# Patient Record
Sex: Female | Born: 1955 | Race: White | Hispanic: No | Marital: Married | Smoking: Never smoker
Health system: Western US, Community
[De-identification: ages and names within clinical notes are randomized; demographics above are authoritative.]

## PROBLEM LIST (undated history)

## (undated) LAB — PREPARE RBC
Blood Product Blood Type Barcode: 5100
Blood Product Blood Type Barcode: 5100
Blood Product Blood Type Barcode: 5100
Blood Product Blood Type Barcode: 5100
Blood Product Blood Type Barcode: 5100
Blood Product Blood Type Barcode: 5100
Blood Product Blood Type Barcode: 5100
Blood Product Blood Type Barcode: 5100
Blood Product Blood Type Barcode: 5100
Blood Product Blood Type Barcode: 5100
Blood Product Blood Type Barcode: 5100
Blood Product Blood Type Barcode: 5100
Blood Product Blood Type Barcode: 5100
Blood Product Blood Type Barcode: 5100
Blood Product Blood Type Barcode: 5100
Blood Product Blood Type Barcode: 5100
Blood Product Blood Type Barcode: 5100
Blood Product Blood Type Barcode: 5100
Blood Product Blood Type Barcode: 5100
Blood Product Blood Type Barcode: 5100
Blood Product Blood Type Barcode: 5100
Blood Product Blood Type Barcode: 9500
Blood Product Status: TRANSFUSED
Blood Product Status: TRANSFUSED
Blood Product Status: TRANSFUSED
Blood Product Status: TRANSFUSED
Blood Product Status: TRANSFUSED
Blood Product Status: TRANSFUSED
Blood Product Status: TRANSFUSED
Blood Product Status: TRANSFUSED
Blood Product Status: TRANSFUSED
Blood Product Status: TRANSFUSED
Blood Product Status: TRANSFUSED
Blood Product Status: TRANSFUSED
Blood Product Status: TRANSFUSED
Blood Product Status: TRANSFUSED
Blood Product Status: TRANSFUSED
Blood Product Unit ABORh: O POS
Blood Product Unit ABORh: O POS
Blood Product Unit ABORh: O POS
Blood Product Unit ABORh: O POS
Blood Product Unit ABORh: O POS
Blood Product Unit ABORh: O POS
Blood Product Unit ABORh: O POS
Blood Product Unit ABORh: O POS
Blood Product Unit ABORh: O POS
Blood Product Unit ABORh: O POS
Blood Product Unit ABORh: O POS
Blood Product Unit ABORh: O POS
Blood Product Unit Expiration: 201708302359
Blood Product Unit Expiration: 201708312359
Blood Product Unit Expiration: 201708312359
Blood Product Unit Expiration: 201708312359
Blood Product Unit Expiration: 201709012359
Blood Product Unit Expiration: 201709012359
Blood Product Unit Expiration: 201709012359
Blood Product Unit Expiration: 201709012359
Blood Product Unit Expiration: 201709052359
Blood Product Unit Expiration: 201709052359
Blood Product Unit Expiration: 201709062359
Blood Product Unit Expiration: 201709062359
Blood Product Unit Expiration: 202306292359
Blood Product Unit Expiration: 202307052359
Blood Product Unit Expiration: 202309142359
Blood Product Unit Expiration: 202309142359
Blood Product Unit Expiration: 202310112359
Blood Product Unit Expiration: 202310162359
Blood Product Unit Expiration: 202310192359
Blood Product Unit Expiration: 202310232359
Blood Product Unit Expiration: 202310232359
Blood Product Unit Expiration: 202310262359
Blood Product Unit Rh: NEGATIVE
Blood Product Unit Rh: POSITIVE
Blood Product Unit Rh: POSITIVE
Blood Product Unit Rh: POSITIVE
Blood Product Unit Rh: POSITIVE
Blood Product Unit Rh: POSITIVE
Blood Product Unit Rh: POSITIVE
Blood Product Unit Rh: POSITIVE
Blood Product Unit Rh: POSITIVE
Blood Product Unit Rh: POSITIVE
Blood Product Unit Rh: POSITIVE
Blood Product Unit Rh: POSITIVE
Blood Product Unit Rh: POSITIVE
Blood Product Unit Rh: POSITIVE
Blood Product Unit Rh: POSITIVE
Blood Product Unit Rh: POSITIVE
Blood Product Unit Rh: POSITIVE
Blood Product Unit Rh: POSITIVE
Blood Product Unit Rh: POSITIVE
Blood Product Unit Rh: POSITIVE
Blood Product Unit Rh: POSITIVE
Blood Product Unit Rh: POSITIVE
Blood Product Unit Volume: 300 ml
Blood Product Unit Volume: 300 ml
Blood Product Unit Volume: 300 ml
Blood Product Unit Volume: 300 ml
Blood Product Unit Volume: 300 ml
Blood Product Unit Volume: 300 ml
Blood Product Unit Volume: 300 ml
Blood Product Unit Volume: 300 ml
Blood Product Unit Volume: 300 ml
Blood Product Unit Volume: 300 ml

## (undated) LAB — PREPARE FRESH FROZEN PLASMA
Blood Product Blood Type Barcode: 5100
Blood Product Blood Type Barcode: 5100
Blood Product Blood Type Barcode: 9500
Blood Product Blood Type Barcode: 9500
Blood Product Status: TRANSFUSED
Blood Product Status: TRANSFUSED
Blood Product Unit ABORh: O NEG
Blood Product Unit ABORh: O NEG
Blood Product Unit ABORh: O POS
Blood Product Unit ABORh: O POS
Blood Product Unit Expiration: 201708220831
Blood Product Unit Expiration: 201708220831
Blood Product Unit Expiration: 201708221120
Blood Product Unit Expiration: 201708221120
Blood Product Unit Rh: NEGATIVE
Blood Product Unit Rh: NEGATIVE
Blood Product Unit Rh: POSITIVE
Blood Product Unit Rh: POSITIVE

## (undated) MED FILL — CALCIUM 500 MG (AS CARBONATE)-VITAMIN D3 5 MCG (200 UNIT) TABLET: 500 mg-200 unit | ORAL | 34 days supply | Qty: 200 | Fill #0

## (undated) MED FILL — HYDROCODONE 5 MG-ACETAMINOPHEN 325 MG TABLET: 5-325 mg | ORAL | 7 days supply | Qty: 30 | Fill #0

## (undated) MED FILL — CEPHALEXIN 500 MG CAPSULE: 500 mg | ORAL | 3 days supply | Qty: 12 | Fill #0

---

## 2016-01-21 ENCOUNTER — Emergency Department: Admit: 2016-01-21 | Discharge: 2016-01-25

## 2016-01-21 ENCOUNTER — Inpatient Hospital Stay
Admission: EM | Admit: 2016-01-21 | Discharge: 2016-01-23 | Disposition: A | Discharge status: Still In Hospital | Payer: MEDICAID | Admitting: MD

## 2016-01-21 ENCOUNTER — Emergency Department: Admit: 2016-01-21

## 2016-01-21 ENCOUNTER — Encounter

## 2016-01-21 DIAGNOSIS — I214 Non-ST elevation (NSTEMI) myocardial infarction: Principal | ICD-10-CM

## 2016-01-21 LAB — CELLAVISION DIFFERENTIAL
Basophils %: 2 % (ref 0–2)
Basophils, Absolute: 0.2 10*3/ÂµL — ABNORMAL HIGH (ref 0.0–0.1)
Eosinophils %: 0 % (ref 0–5)
Eosinophils, Absolute: 0 10*3/ÂµL (ref 0.0–0.4)
Lymphocytes %: 15 % — ABNORMAL LOW (ref 20–40)
Lymphocytes, Absolute: 1.8 10*3/ÂµL (ref 1.0–4.0)
Monocytes %: 4 % (ref 2–12)
Monocytes, Absolute: 0.5 10*3/ÂµL (ref 0.2–1.0)
Neutrophils %: 79 % — ABNORMAL HIGH (ref 50–74)
Neutrophils, Absolute: 9.7 10*3/ÂµL — ABNORMAL HIGH (ref 2.0–7.4)
Platelet Estimate: INCREASED — AB
Total Cells Counted: 100
WBC: 12.2 10*3/ÂµL — ABNORMAL HIGH (ref 4.5–11.0)
nRBC: 2 #/100 WBC — ABNORMAL HIGH

## 2016-01-21 LAB — PROCALCITONIN: Procalcitonin: <0.05 ng/mL (ref <=0.09)

## 2016-01-21 LAB — HEPATIC FUNCTION PANEL
ALT - Alanine Aminotransferase: 35 U/L — ABNORMAL HIGH (ref 5–33)
AST - Aspartate Aminotransferase: 29 U/L (ref 5–32)
Albumin: 3.6 g/dL (ref 3.5–5.0)
Alkaline Phosphatase: 87 U/L (ref 35–105)
Bilirubin Direct: <0.2 mg/dL (ref 0.0–0.3)
Bilirubin Total: 0.9 mg/dL (ref 0.10–1.70)
Protein Total: 7 g/dL (ref 6.1–7.9)

## 2016-01-21 LAB — POCT CHEM 8
POC Anion Gap: 18 mmol/L (ref 12–20)
POC BUN: 22 mg/dL — ABNORMAL HIGH (ref 6–20)
POC Chloride: 105 mmol/L (ref 101–111)
POC Creatinine: 0.8 mg/dL (ref 0.6–1.3)
POC Glucose: 123 mg/dL — ABNORMAL HIGH (ref 74–106)
POC Hematocrit: 31 %PCV — ABNORMAL LOW (ref 35–48)
POC Hemoglobin: 10.5 g/dL — ABNORMAL LOW (ref 11.7–16.5)
POC Ionized Calcium: 5.3 mg/dL (ref 4.5–5.3)
POC Potassium: 3.5 mmol/L (ref 3.5–5.1)
POC Sodium: 138 mmol/L (ref 135–145)
POC Total CO2: 19 mmol/L — ABNORMAL LOW (ref 22–32)

## 2016-01-21 LAB — CBC WITH AUTO DIFFERENTIAL
HCT: 29 % — ABNORMAL LOW (ref 35.0–48.0)
Hemoglobin: 8.6 g/dL — ABNORMAL LOW (ref 11.7–16.5)
MCH: 20.3 pg — ABNORMAL LOW (ref 28.3–33.3)
MCHC: 29.8 g/dL — ABNORMAL LOW (ref 32.5–36.0)
MCV: 68 fL — ABNORMAL LOW (ref 81.0–100.0)
MPV: 8.4 fL (ref 6.9–10.0)
Platelet Count: 487 10*3/ÂµL — ABNORMAL HIGH (ref 150–405)
RBC: 4.26 10*6/ÂµL (ref 3.80–5.60)
RDW: 17.3 % — ABNORMAL HIGH (ref 11.7–16.1)
WBC: 12.2 10*3/ÂµL — ABNORMAL HIGH (ref 4.5–11.0)

## 2016-01-21 LAB — MYOGLOBIN: Myoglobin: 50 ng/mL (ref 25–58)

## 2016-01-21 LAB — RETICULOCYTE COUNT
Reticulocyte Absolute Count: 0.121 10*6/ÂµL — ABNORMAL HIGH (ref 0.037–0.109)
Reticulocyte Relative Percent: 2.9 % — ABNORMAL HIGH (ref 1.0–2.3)

## 2016-01-21 LAB — PRO B-TYPE NATRIURETIC PEPTIDE (SLM): Pro B-Type Natriuretic Peptide: 5485 pg/mL — ABNORMAL HIGH (ref 0–900)

## 2016-01-21 LAB — CK: CK (Creatine Kinase): 64 U/L (ref 26–192)

## 2016-01-21 LAB — LACTIC ACID: Lactic Acid: 1.5 mmol/L (ref 0.5–2.2)

## 2016-01-21 LAB — TROPONIN T: Troponin T: 0.08 ng/mL (ref 0.00–0.01)

## 2016-01-21 LAB — CKMB: CKMB: 2.4 ng/mL (ref 0.6–6.3)

## 2016-01-21 LAB — POC TROPONIN I: POC Troponin: 0.15 ng/mL — ABNORMAL HIGH (ref 0.00–0.08)

## 2016-01-21 LAB — BLOOD CULTURE
Blood Culture Result: NO GROWTH
Blood Culture Result: NO GROWTH

## 2016-01-21 LAB — C-REACTIVE PROTEIN (HIGH SENSITIVITY): CRP (High Sensitivity): 8.78 mg/dL — ABNORMAL HIGH (ref 0.000–0.748)

## 2016-01-21 MED ORDER — iopamidol (ISOVUE-370) 76 % injection 100 mL
370 | Freq: Once | INTRAVENOUS | Status: AC
Start: 2016-01-21 — End: 2016-01-21
  Administered 2016-01-21: 23:00:00 370 mL via INTRAVENOUS

## 2016-01-21 MED ORDER — aspirin 81 MG chewable tablet 324 mg
81 | Freq: Once | ORAL | Status: AC
Start: 2016-01-21 — End: 2016-01-21
  Administered 2016-01-21: 22:00:00 81 mg via ORAL

## 2016-01-21 MED ORDER — sodium chloride (NS) 0.9% bolus 500 mL
Freq: Once | INTRAVENOUS | Status: AC
Start: 2016-01-21 — End: 2016-01-21
  Administered 2016-01-21 (×2): via INTRAVENOUS

## 2016-01-21 MED ORDER — albuterol (PROVENTIL) nebulizer solution 2.5 mg
2.5 | Freq: Once | RESPIRATORY_TRACT | Status: AC
Start: 2016-01-21 — End: 2016-01-21
  Administered 2016-01-21: 2.5 mg via RESPIRATORY_TRACT

## 2016-01-21 MED ORDER — nitroglycerin (NITROSTAT) SL tablet 0.4 mg
0.4 | SUBLINGUAL | Status: DC | PRN
Start: 2016-01-21 — End: 2016-01-21

## 2016-01-21 NOTE — ED Notes (Signed)
Pt updated re poc. Intermittent cough & SOB. IVF infusing. Husband at bedside.

## 2016-01-21 NOTE — ED Notes (Signed)
Pt updated re poc. IV ABX started. No distress noted or reported at this time. Cough notably improved from initial assessment. Cont to monitor.

## 2016-01-21 NOTE — ED Notes (Signed)
Pt back from CT. Cont to deny CP. Vss. Husband at bedside.

## 2016-01-21 NOTE — ED Notes (Signed)
Pt reports little improvement after resp tx. Vss.

## 2016-01-21 NOTE — Progress Notes (Signed)
HOME MEDICATION LIST REVIEWED BY PHARMACIST        Prior To Admission Home Medication list in Epic was prepared during this inpatient visit by a team member other than a pharmacist in a best attempt to document medications that the patient takes in the outpatient setting:      Home Medications     Med List Status:  Pharmacist Review Complete Abelardo Diesel, [facility] 01/21/2016  4:46 PM                  naproxen sodium (ALEVE) 220 MG tablet     Take 220 mg by mouth daily as needed.          The pharmacist has reviewed the list, without direct patient communication, and has not identified any concerns that are anticipated to cause harm in a typical patient.   Inaccuracies may still exist, and the home medication list has not been clinically evaluated for appropriate indications; please use clinical judgment when reconciling the list.    Signed by: Abelardo Diesel, Pharmacist

## 2016-01-21 NOTE — Progress Notes (Signed)
Pt is SOB b/s clear and diminished no change post tx. Productive cough. Pt is very anxious. 94% on RA has no respiratory history.      Lab Results   Component Value Date    WHITEBLOODCE 12.2 (H) 01/21/2016    WHITEBLOODCE 12.2 (H) 01/21/2016    HGB 8.6 (L) 01/21/2016    HCT 29.0 (L) 01/21/2016    MCV 68.0 (L) 01/21/2016    LABPLAT 487 (H) 01/21/2016     Start albuterol PRN. Will re-evaluate as needed.   [facility] Felix Pratt   01/21/2016

## 2016-01-21 NOTE — Progress Notes (Signed)
EKG COMPLETED IN ED AT 1407 BY Akita Maxim 01/21/2016

## 2016-01-21 NOTE — H&P (Addendum)
HISTORY AND PHYSICAL EXAMINATION  [facility]    Pt. Name/Age/DOBIretha [name]     60 y.o.   01-29-1956    [phone]   Medical Record Number:   [id number]  CSN: [id number]  Date of admission:  01/21/2016  Primary Care Physician:  NO FAMILY PHYSICIAN,  Admitting Physician:  Eldridge Abrahams     Chief Complaint/Reason for Visit:  Chest pressure and progressive SOB.     History of Present Illness:     Pt is a 60 yr old female with no significant PMH, never seen doctors in the past, is otherwise very healthy and physically active comes to the hospital with c/c of progressively worsening SOB since June. She says developed this cough, congestion since June which is getting worse and now she has a clear mucoidal phlegm too. Along with coughing she also c/o some chest pressure but not pain. Has been having progressively increasing LE edema. Gets DOE easily now. Denies SOB at rest or orthopnea or PND. No prior MIs. Denies any drug, smoking or alcohol use. Denies any recent travel, sick contact etc.   Says she lost 50lbs in last couple of months. Does have +FH of colon and ovarian cancer in mom.   Denies any hematuria, melena, hematochezia, will other wise have regular BMs. Never had any cancer screening - colonoscopy, mammogram or pap smear done as it will cost money.     In the ed, she was hypotensive 99/66, tachycardic in 120s. sats 95% on RA.   Labs: wbc 12.2, Hb 8.6, Htct 29, MCV 68, Platlets 487, Trop 0.08, bnp 5485, crp 8.7, procal neg, CPK-MB neg.   EKG: ST depressions in V4-6, Sinus tachy  Cxr: Scattered reticular opacities are present in a perihilar distribution with some indistinctness of the vasculature: Findings of likely mild edema. ?No definite pleural abnormality.   CTA: neg PE, Mod cardiomegaly, There are small bilateral pleural effusions, slightly larger on the right. There is diffuse geographic ground-glass attenuation seen throughout both lungs along with associated mild interlobular  septal thickening. These findings are nonspecific but most likely reflective of pulmonary edema given cardiomegaly and the pleural effusions. There are a few areas of patchy opacity seen scattered throughout both lungs which are likely reflective of atelectasis and/or scarring.  There are multiple mildly prominent mediastinal and bilateral hilar lymph nodes which may be reactive in etiology although other etiologies cannot completely be excluded. A follow-up CT scan of the chest is recommended in 3-4 months to assess for the   resolution of the lymphadenopathy.     Dr.Kucinsky is consulted, wont like to cath at this point and will see pt in am.   Stat echo ordered.     Review of Systems   Constitutional: Positive for diaphoresis, malaise/fatigue and weight loss. Negative for chills and fever.   HENT: Positive for congestion. Negative for hearing loss.    Eyes: Negative for blurred vision, double vision and photophobia.   Respiratory: Positive for cough, sputum production, shortness of breath and wheezing. Negative for hemoptysis.    Cardiovascular: Positive for leg swelling. Negative for chest pain (chest pressure), palpitations, orthopnea and claudication.   Gastrointestinal: Positive for nausea. Negative for abdominal pain, blood in stool, constipation, diarrhea, heartburn, melena and vomiting.   Genitourinary: Negative for dysuria, frequency, hematuria and urgency.   Musculoskeletal: Negative for back pain, myalgias and neck pain.   Skin: Negative for rash.   Neurological: Positive for weakness and headaches. Negative for dizziness,  tingling, tremors, sensory change, speech change, focal weakness, seizures and loss of consciousness.   Psychiatric/Behavioral: Negative for depression, hallucinations, substance abuse and suicidal ideas.       Past Medical History:   History reviewed. No pertinent past medical history.  Past Surgical History:   Procedure Laterality Date   . TONSILLECTOMY         Allergies:      Allergies   Allergen Reactions   . Sulfa (Sulfonamide Antibiotics) Rash       Home Medications:   Prior to Admission medications    Medication Sig Start Date End Date Taking? Authorizing Provider   naproxen sodium (ALEVE) 220 MG tablet Take 220 mg by mouth daily as needed.   Yes Historical Provider, MD       No current facility-administered medications on file prior to encounter.      No current outpatient prescriptions on file prior to encounter.       Medications administered in the ED  Medications   nitroglycerin (NITROSTAT) SL tablet 0.4 mg (not administered)   albuterol (PROVENTIL) nebulizer solution 2.5 mg (not administered)     And   ipratropium-albuterol (DUO-NEB) 0.5 mg-3 mg(2.5 mg base)/3 mL nebulizer solution 3 mL (not administered)     And   racepinephrine (VAPONEFRIN) 2.25 % nebulizer solution 0.5 mL (not administered)   heparin injection 5,000 Units (not administered)   sodium chloride 0.9 % (not administered)   ceftriaxone (ROCEPHIN) 1 g in 50 mL 0.9 % NaCl SNAP IVPB (not administered)   azithromycin (ZITHROMAX) 500 mg in 250 mL 0.9 % NaCl SNAP IVPB (500 mg Intravenous New Bag 01/21/16 1910)   guaiFENesin (MUCINEX) 12 hr tablet 600 mg (600 mg Oral Given 01/21/16 1905)   Normal saline infusion for medication/blood product administration for nursing (not administered)   pantoprazole (PROTONIX) EC tablet 40 mg (40 mg Oral Given 01/21/16 1904)   aspirin 81 MG chewable tablet 324 mg (324 mg Oral Given 01/21/16 1451)   sodium chloride (NS) 0.9% bolus 500 mL (0 mL Intravenous Stopped 01/21/16 1550)   iopamidol (ISOVUE-370) 76 % injection 100 mL (100 mL Intravenous Given 01/21/16 1545)   albuterol (PROVENTIL) nebulizer solution 2.5 mg (2.5 mg Nebulization Given 01/21/16 1648)   sodium chloride (NS) 0.9% bolus 500 mL (0 mL Intravenous Stopped 01/21/16 1910)   furosemide (LASIX) injection 20 mg (20 mg Intravenous Given 01/21/16 1907)   atorvastatin (LIPITOR) tablet 80 mg (80 mg Oral Given 01/21/16 1905)   digoxin  (LANOXIN) tablet 250 mcg (250 mcg Oral Given 01/21/16 1904)        Family History:    History reviewed. No pertinent family history.    Social History:   Social History     Social History   . Marital status: Married     Spouse name: N/A   . Number of children: N/A   . Years of education: N/A     Occupational History   . Not on file.     Social History Main Topics   . Smoking status: Never Smoker   . Smokeless tobacco: Never Used   . Alcohol use No   . Drug use: No   . Sexual activity: Not on file     Other Topics Concern   . Not on file     Social History Narrative   . No narrative on file       Code Status:   DNR/DNI    OBJECTIVE:  VITAL SIGNS on ADMISSION   Temp Blood Pressure Heart Rate Resp Rate O2 Sats   Temp: 36.5 ?C (97.7 ?F) BP: 126/85 Pulse: (!) 127 Resp: 24   SpO2: 96 % on  L/min                                                 MOST RECENT VITAL SIGNS   Temp Blood Pressure Heart Rate Resp Rate O2 Sats   Temp: 36.9 ?C (98.4 ?F) BP: 98/65 Pulse: (!) 117 Resp: (!) 28   SpO2: 95 % on  L/min      Admission Weight: Weight: 102.5 kg (226 lb)       BMI: Body mass index is 36.48 kg/(m^2).    Physical Examination:     Vitals:    01/21/16 1844   BP: 98/65   Pulse: (!) 117   Resp: (!) 28   Temp: 36.9 ?C (98.4 ?F)   SpO2: 95%       Physical Exam   Constitutional: She is oriented to person, place, and time. She appears well-developed and well-nourished. She appears distressed.   HENT:   Head: Normocephalic and atraumatic.   Mouth/Throat: No oropharyngeal exudate.   Eyes: Conjunctivae and EOM are normal. Pupils are equal, round, and reactive to light. No scleral icterus.   Neck: Neck supple. No JVD present.   Cardiovascular: Regular rhythm and intact distal pulses.    Murmur heard.  tachycardic   Pulmonary/Chest: Breath sounds normal. She is in respiratory distress. She has no wheezes. Rales: right >left. She exhibits no tenderness.   Abdominal: Soft. Bowel sounds are normal.  She exhibits no distension. There is no tenderness. There is no rebound and no guarding.   Musculoskeletal: She exhibits edema (2+ pitting b/l). She exhibits no tenderness.   Neurological: She is alert and oriented to person, place, and time. She has normal reflexes. She displays normal reflexes. No cranial nerve deficit. She exhibits normal muscle tone. Coordination normal.   Skin: Skin is warm. No rash noted. She is not diaphoretic. No erythema.   Psychiatric: She has a normal mood and affect. Her behavior is normal. Judgment and thought content normal.       X-ray Chest Pa Or Ap    Result Date: 01/21/2016  PROCEDURE: XR CHEST PA OR AP  COMPARISON: None.  INDICATIONS: shortness of breath  FINDINGS:   LINES/TUBES: None.  HEART SIZE: Mild cardiac enlargement.  MEDIASTINUM: Normal.  LUNGS/PLEURA: Scattered reticular opacities are present in a perihilar distribution with some indistinctness of the vasculature: Findings of likely mild edema.  No definite pleural abnormality.  OTHER: None.  CONCLUSION: See above.             Report Dictated and Electronically Signed ByJeanice Lim on 01/21/2016 at 17:13          Ct Angiogram Pulmonary    Result Date: 01/21/2016  PROCEDURE:  CT ANGIOGRAM PULMONARY  CONTRAST NONIONIC CONTRAST  COMPARISON: None.  INDICATIONS: dyspnea / chest pain evaluate pe  TECHNIQUE: 2mm CT images were obtained of the chest with narrow collimation after the injection of IV nonionic contrast timed per protocol for maximum opacification of the pulmonary arteries.  Coronal MIP reformatted images were obtained. Automated exposure control based on patient size was utilized as a dose reduction technique.  FINDINGS: The lack of a proper  patient breath hold and the patient's right arm being in the downward position resulting in spray artifact somewhat limits the evaluation of fine detail.  PULMONARY ARTERIES: No large main branch or segmental pulmonary embolism is seen.  LUNGS: There are small bilateral  pleural effusions, slightly larger on the right. There is diffuse geographic ground-glass attenuation seen throughout both lungs along with associated mild interlobular septal thickening. These findings are nonspecific but most likely reflective of pulmonary edema given cardiomegaly and the pleural effusions. There are a few areas of patchy opacity seen scattered throughout both lungs which are likely reflective of atelectasis and/or scarring.  THORACIC AORTA: No aneurysm. Minimal atherosclerotic aortic calcification. HEART / PERICARDIUM: Moderate cardiomegaly. No significant pericardial effusion.  MEDIASTINUM/HILA: There are multiple mildly prominent mediastinal and bilateral hilar lymph nodes which may be reactive in etiology although other etiologies cannot completely be excluded. A follow-up CT scan of the chest is recommended in 3-4 months to assess for the resolution of the lymphadenopathy.  CHEST WALL: Normal. LIMITED ABDOMEN: There is a small hypodense lesion seen about the falciform ligament of the liver which is too small to characterize. A tiny calcified granuloma is noted within the spleen. OTHER: There is moderate rightward convexity scoliosis of the thoracic spine along with moderate multilevel degenerative change.  CONCLUSION: See above.             Report Dictated and Electronically Signed By:  Francella Solian on 01/21/2016 at 16:15             Lab data:    Results for orders placed or performed during the hospital encounter of 01/21/16 (from the past 24 hour(s))   POC Troponin I -Next Routine     Status: Abnormal   Result Value    POC Troponin 0.15 (H)   POC Chem 8 -Next Routine     Status: Abnormal   Result Value    POC Sodium 138    POC Potassium 3.5    POC Chloride 105    POC Ionized Calcium 5.3    POC BUN 22 (H)    POC Creatinine 0.8    POC Hematocrit 31 (L)    POC Hemoglobin 10.5 (L)    POC Total CO2 19 (L)    POC Anion Gap 18    POC Glucose 123 (H)   Pro BNP (SLM) -STAT     Status: Abnormal      Result Value    Pro B-Type Natriuretic Peptide 5485 (H)   Hepatic Function Panel -STAT     Status: Abnormal   Result Value    Albumin 3.6    Bilirubin Direct <0.2    Alkaline Phosphatase 87    AST - Aspartate Aminotransferase 29    ALT - Alanine Amino transferase 35 (H)    Protein Total 7.0    Bilirubin Total 0.90   Troponin T -STAT     Status: Abnormal   Result Value    Troponin T 0.08 (HCrit)   CK (Creatine Kinase) -STAT     Status: Normal   Result Value    CK (Creatine Kinase) 64   CKMB (SLM) -STAT     Status: Normal   Result Value    CKMB 2.4   Myoglobin -STAT     Status: Normal   Result Value    Myoglobin 50   CBC with Auto Differential -STAT     Status: Abnormal   Result Value    WBC 12.2 (H)  RBC 4.26    Hemoglobin 8.6 (L)    HCT 29.0 (L)    MCV 68.0 (L)    MCH 20.3 (L)    MCHC 29.8 (L)    RDW 17.3 (H)    Platelet Count 487 (H)    MPV 8.4   Procalcitonin -Next Routine     Status: Normal   Result Value    Procalcitonin <0.05   Cellavision Differential -Next Routine     Status: Abnormal   Result Value    WBC 12.2 (H)    Neutrophils % 79 (H)    Lymphocytes % 15 (L)    Monocytes % 4    Eosinophils % 0    Basophils % 2    Neutrophils, Absolute 9.7 (H)    Lymphocytes, Absolute 1.8    Monocytes, Absolute 0.5    Eosinophils, Absolute 0.0    Basophils, Absolute 0.2 (H)    Total Cells Counted 100    Platelet Estimate Increased (A)    Anisocytosis 2+    Poikilocytosis 1+    Polychromasia 1+    Hypochromasia 1+    Microcytes 2+    Ovalocytes 1+    nRBC 2 (H)   CRP, High-Sensitivity -ASAP     Status: Abnormal   Result Value    CRP (High Sensitivity) 8.780 (H)   Lactic Acid, Plasma -STAT     Status: Normal   Result Value    Lactic Acid 1.5       Admission Diagnoses:  Active Hospital Problems    Diagnosis SNOMED CT(R) Date Noted   . Heart failure (CMS/HCC) HEART FAILURE 01/21/2016   . Elevated brain natriuretic peptide (BNP) level HORMONE LEVEL - FINDING 01/21/2016   . NSTEMI (non-ST elevated myocardial infarction)  (CMS/HCC) ACUTE NON-ST SEGMENT ELEVATION MYOCARDIAL INFARCTION 01/21/2016   . Demand ischemia of myocardium (CMS/HCC) ACUTE ISCHEMIC HEART DISEASE 01/21/2016   . Tachycardia TACHYCARDIA 01/21/2016   . Pleural effusion - mild  PLEURAL EFFUSION 01/21/2016   . Obesity OBESITY 01/21/2016   . Acute respiratory failure with hypoxia (CMS/HCC) ACUTE RESPIRATORY FAILURE 01/21/2016   . Hypotension LOW BLOOD PRESSURE 01/21/2016   . Iron deficiency anemia IRON DEFICIENCY ANEMIA 01/21/2016   . Thrombocytosis (CMS/HCC) THROMBOCYTOSIS 01/21/2016   . Weight loss WEIGHT DECREASED 01/21/2016   . Family history of cancer FAMILY HISTORY OF CANCER 01/21/2016   . Cardiomegaly CARDIOMEGALY 01/21/2016   . Mediastinal lymphadenopathy MEDIASTINAL LYMPHADENOPATHY 01/21/2016   . Hilar lymphadenopathy- bilataeral  HILAR LYMPHADENOPATHY 01/21/2016       ASSESSMENT and PLAN:     Pt is a 60 yr old female with no significant PMH presents with:    1) acute hypoxic resp failure likely sec to new  heart failure exacerbation.  This going on since June. Suspect some sort of myocarditis - ischemic/ viral (given started with resp symptoms).   - we will obtain stat echo to see EF, WMA, valvular lesions etc.   - Dr.Kucinsky to see the pt in am.   - 2 PRBC with lasix IV in between.   - digoxin.   - based on echo further management will be done - BB, ACEI etc.   - if she decompensated she will need central line and dobutamine etc support.     2) new onset CHF with NSTEMI : etiology to be determined. Cardiology consulted. Trend trops. If elevated then will need heparin gtt over night. Night team updated.   - continue ASA, statin, O2 for now.   -  once stable pt should get cardiac cath to r/o CAD as cause of her CHF. Will let Dr.Kucinsky decide on that.     3) Microcytic anemia, likely sec to IDA: pt never had age appropriate cancer screening in her life, has +fh of cancers and says she lost 50lb wt loss.   - iron studies. Will give her 2PRBC, start on  iron supplementation.   - she will need colonoscopy and if that's neg an EGD. Also a pap smear, mammogram.   - CTA shows some mediastinal LNpathy, follow up CTs to check resolution.     4) Wt loss: age appropriate cancer screening needed.  she will need colonoscopy and if that's neg an EGD. Also a pap smear, mammogram.   - CTA shows some mediastinal LNpathy, follow up CTs to check resolution.     5) leucocytosis with thrombocytosis: WBC elevation could be from stress however possibility of CAP remains. Will do resp culture, IV Rocephin and azithromax, MRSA pcr.   - RTDP  - thrombocytosis sec to IDA, stress.     6) morbid obesity: wt loss recommended, diet and exercise recommended.     7) b/l hilar and mediastinal LNpathy: outpt follow up with CT as recommended.     DVT /gi Prophylaxis. Heparin and PPI.     RM, PT/OT consulted.     DNR/DNI    Dispo:  Pt to remain inpt for 2 days at least. PCU admit.     Eldridge Abrahams, MD      01/21/16  7:13 PM

## 2016-01-21 NOTE — ED Notes (Signed)
RT to rm

## 2016-01-21 NOTE — ED Triage Notes (Signed)
Pt reports SOB & cough began in June.

## 2016-01-21 NOTE — ED Notes (Signed)
Assisting primary RN while they are on lunch. Patient resting quietly in room with even and unlabored respirations and appears to be in no distress at this time. Will continue to monitor.

## 2016-01-21 NOTE — ED Provider Notes (Signed)
[facility] Emergency Department Encounter      Chief Complaint   Patient presents with   . Shortness of Breath   . Cough       HPI:  Brandy [name] is a 60 y.o. female who presents secondary to dyspnea and chest pain.    Patient reports that she's had a cough since this past June. It is productive in nature. She reports that since onset of her cough she's had worsening dyspnea. She is also had chest tightness/discomfort along with some extremity edema.    Patient denies any fevers, chills, and all other symptomatic complaints at present time.    History reviewed. No pertinent past medical history.      Past Surgical History:   Procedure Laterality Date   . TONSILLECTOMY         Family History:  family history is not on file.    Social History:  she   Social History     Social History   . Marital status: Married     Spouse name: N/A   . Number of children: N/A   . Years of education: N/A     Occupational History   . Not on file.     Social History Main Topics   . Smoking status: Never Smoker   . Smokeless tobacco: Never Used   . Alcohol use No   . Drug use: No   . Sexual activity: Not on file     Other Topics Concern   . Not on file     Social History Narrative   . No narrative on file          Allergies:  Allergies   Allergen Reactions   . Sulfa (Sulfonamide Antibiotics) Rash       Home Medications:  Previous Medications    NAPROXEN SODIUM (ALEVE) 220 MG TABLET    Take 220 mg by mouth daily as needed.       ROS:  Please see the history of present illness and nurse's notes for pertinent positives and negatives. The remainder of a 10 point review of systems is either negative or not pertinent.      Physical Exam:     Initial Vitals   BP 01/21/16 1356 126/85   Pulse 01/21/16 1356 127   Resp 01/21/16 1356 24   Temp 01/21/16 1356 36.5 ?C (97.7 ?F)   SpO2 01/21/16 1356 96 %         GEN:  In general the patient is a pleasant 60 year old female resting in her bed.  HEENT: Pupils are equal, round and reactive. There is no  scleral icterus or injection. Mucus membranes are moist.  NECK: Supple and non-tender. No jugular venous distention appreciated.   CV: Tachycardic with a regular rhythm. Patient does have a systolic murmur on exam.  RESP: Lungs clear on auscultation bilaterally.  The patient is breathing comfortably and speaking in full sentences.  ABD: Soft and non-distended with normal bowel sounds. Nontender with palpation.  EXT: Warm and well-perfused without significant edema.  SKIN: Warm and dry.Marland Kitchen  NEURO: Alert and moving all 4 extremities purposefully.    ED Course / Medical decision-making  The patient arrived by private car and is accompanied by spouse. The patient was triaged to room 15.  An IV was placed and labs were drawn.  I reviewed the patient's vital signs and a history and physical exam were completed.  The patient was maintained on appropriate monitoring.  EKG obtained which revealed sinus tachycardia with PVCs. She also had ST depression in leads V4 through V6. She had inferior/lateral ST changes.    I stat troponin 0.15.    Consultation immediately placed to Dr. Crissie Figures, the on-call cardiologist, regarding patient's abnormal symptoms/EKG. he did review patient's EKG and troponin level. At this point in time he is recommending admission to the medicine service without immediate angiogram/heart catheter. He felt comfortable with aspirin only at this point time.    Patient was given IV hydration, aspirin, nitroglycerin.    Blood cultures, Procalcitonin, and lactic acid were also obtained secondary to her productive cough/tachycardia.    Patient CT scan did not reveal any large Main or segmental pulmonary embolisms. She did have small bilateral pleural effusions. She also had findings of pulmonary edema and cardiomegaly. She did have a few areas of patchy opacities in both lungs which is likely atelectasis or scarring per radiologist. She did have some atherosclerosis. She had multiple mildly prominent  mediastinal and bilateral hilar lymph nodes which may be reactive. However other etiologies exist. Follow-up CT scan is recommended in the next 3-4 months. She also had hypodense lesion in her liver.    Patient's blood work indicated that her proBNP was elevated and there is a possibility of heart failure. I did not immediately give patient Lasix here in the ER secondary to her tachycardia.  As patient was tachycardic she was given small normal saline fluid boluses which seemed to improve her tachycardia.I did place another call to the on-call cardiologist for recommendations for medical treatment with patient's abnormal vitals and findings of suspected heart failure. Unfortunately he was not able speak with him immediately because he was in a procedure. However he informed his nursing staff that he would contact me when he was available.     Since she's had a productive cough with opacities on her CT scan there is a question whether or not she may have pneumonia. She was ordered Rocephin and azithromycin here in the ER for the possibility of pneumonia.    I did speak with the hospitalist service, Elby Showers N.P., who is willing to assist with admission the hospital. We did discuss for his medical treatment for patient. At this point time he would like me to discontinue the antibiotics. He is comfortable with aspirin only at this point in time. He will discuss case with the on-call cardiologist regarding ongoing treatment options for this patient.    I appreciate the hospital service willingness to admit patient to hospital for ongoing evaluation and treatment      Results:     Labs:  Results for orders placed or performed during the hospital encounter of 01/21/16 (from the past 8 hour(s))   POC Troponin I -Next Routine    Collection Time: 01/21/16  2:24 PM   Result Value Ref Range    POC Troponin 0.15 (H) 0.00 - 0.08 ng/mL   POC Chem 8 -Next Routine    Collection Time: 01/21/16  2:38 PM   Result Value Ref  Range    POC Sodium 138 135 - 145 mmol/L    POC Potassium 3.5 3.5 - 5.1 mmol/L    POC Chloride 105 101 - 111 mmol/L    POC Ionized Calcium 5.3 4.5 - 5.3 mg/dL    POC BUN 22 (H) 6 - 20 mg/dL    POC Creatinine 0.8 0.6 - 1.3 mg/dL    POC Hematocrit 31 (L) 35 - 48 %PCV  POC Hemoglobin 10.5 (L) 11.7 - 16.5 g/dL    POC Total CO2 19 (L) 22 - 32 mmol/L    POC Anion Gap 18 12 - 20 mmol/L    POC Glucose 123 (H) 74 - 106 mg/dL   Pro BNP (SLM) -STAT    Collection Time: 01/21/16  2:45 PM   Result Value Ref Range    Pro B-Type Natriuretic Peptide 5485 (H) 0 - 900 pg/mL   Hepatic Function Panel -STAT    Collection Time: 01/21/16  2:45 PM   Result Value Ref Range    Albumin 3.6 3.5 - 5.0 g/dL    Bilirubin Direct <1.6 0.0 - 0.3 mg/dL    Alkaline Phosphatase 87 35 - 105 U/L    AST - Aspartate Aminotransferase 29 5 - 32 U/L    ALT - Alanine Amino transferase 35 (H) 5 - 33 U/L    Protein Total 7.0 6.1 - 7.9 g/dL    Bilirubin Total 1.09 0.10 - 1.70 mg/dL   Troponin T -STAT    Collection Time: 01/21/16  2:45 PM   Result Value Ref Range    Troponin T 0.08 (HCrit) 0.00-<0.01 ng/mL   CK (Creatine Kinase) -STAT    Collection Time: 01/21/16  2:45 PM   Result Value Ref Range    CK (Creatine Kinase) 64 26 - 192 U/L   CKMB (SLM) -STAT    Collection Time: 01/21/16  2:45 PM   Result Value Ref Range    CKMB 2.4 0.6 - 6.3 ng/mL   Myoglobin -STAT    Collection Time: 01/21/16  2:45 PM   Result Value Ref Range    Myoglobin 50 25 - 58 ng/mL   CBC with Auto Differential -STAT    Collection Time: 01/21/16  2:45 PM   Result Value Ref Range    WBC 12.2 (H) 4.5 - 11.0 10*3/?L    RBC 4.26 3.80 - 5.60 10*6/?L    Hemoglobin 8.6 (L) 11.7 - 16.5 g/dL    HCT 60.4 (L) 54.0 - 48.0 %    MCV 68.0 (L) 81.0 - 100.0 fL    MCH 20.3 (L) 28.3 - 33.3 pg    MCHC 29.8 (L) 32.5 - 36.0 g/dL    RDW 98.1 (H) 19.1 - 16.1 %    Platelet Count 487 (H) 150 - 405 10*3/?L    MPV 8.4 6.9 - 10.0 fL   Procalcitonin -Next Routine    Collection Time: 01/21/16  2:45 PM   Result Value Ref  Range    Procalcitonin <0.05 <=0.09 ng/mL   Cellavision Differential -Next Routine    Collection Time: 01/21/16  2:45 PM   Result Value Ref Range    WBC 12.2 (H) 4.5 - 11.0 10*3/?L    Neutrophils % 79 (H) 50 - 74 %    Lymphocytes % 15 (L) 20 - 40 %    Monocytes % 4 2 - 12 %    Eosinophils % 0 0 - 5 %    Basophils % 2 0 - 2 %    Neutrophils, Absolute 9.7 (H) 2.0 - 7.4 10*3/?L    Lymphocytes, Absolute 1.8 1.0 - 4.0 10*3/?L    Monocytes, Absolute 0.5 0.2 - 1.0 10*3/?L    Eosinophils, Absolute 0.0 0.0 - 0.4 10*3/?L    Basophils, Absolute 0.2 (H) 0.0 - 0.1 10*3/?L    Total Cells Counted 100     Platelet Estimate Increased (A) Adequate    Anisocytosis 2+     Poikilocytosis  1+     Polychromasia 1+     Hypochromasia 1+     Microcytes 2+     Ovalocytes 1+     nRBC 2 (H) 0 #/100 WBC   Lactic Acid, Plasma -STAT    Collection Time: 01/21/16  3:11 PM   Result Value Ref Range    Lactic Acid 1.5 0.5 - 2.2 mmol/L       Radiology:  X-ray chest PA or AP   Final Result   PROCEDURE: XR CHEST PA OR AP       COMPARISON: None.       INDICATIONS: shortness of breath       FINDINGS:        LINES/TUBES: None.       HEART SIZE: Mild cardiac enlargement.       MEDIASTINUM: Normal.       LUNGS/PLEURA: Scattered reticular opacities are present in a    perihilar distribution with some indistinctness of the vasculature:    Findings of likely mild edema.  No definite pleural abnormality.       OTHER: None.       CONCLUSION: See above.                              Report Dictated and Electronically Signed ByJeanice Lim on    01/21/2016 at 17:13               CT angiogram pulmonary   Final Result   PROCEDURE:  CT ANGIOGRAM PULMONARY       CONTRAST NONIONIC CONTRAST       COMPARISON: None.       INDICATIONS: dyspnea / chest pain evaluate pe       TECHNIQUE: 2mm CT images were obtained of the chest with narrow    collimation after the injection of IV nonionic contrast timed per    protocol for maximum opacification of the pulmonary arteries.      Coronal MIP reformatted images were obtained. Automated exposure    control based on patient size was utilized as a dose reduction    technique.       FINDINGS: The lack of a proper patient breath hold and the patient's    right arm being in the downward position resulting in spray artifact    somewhat limits the evaluation of fine detail.       PULMONARY ARTERIES: No large main branch or segmental pulmonary    embolism is seen.       LUNGS: There are small bilateral pleural effusions, slightly larger    on the right. There is diffuse geographic ground-glass attenuation    seen throughout both lungs along with associated mild interlobular    septal thickening. These findings are nonspecific but most likely    reflective of pulmonary edema given cardiomegaly and the pleural    effusions. There are a few areas of patchy opacity seen scattered    throughout both lungs which are likely reflective of atelectasis    and/or scarring.       THORACIC AORTA: No aneurysm. Minimal atherosclerotic aortic    calcification.   HEART / PERICARDIUM: Moderate cardiomegaly. No significant    pericardial effusion.       MEDIASTINUM/HILA: There are multiple mildly prominent mediastinal and    bilateral hilar lymph nodes which may be reactive in etiology    although other etiologies cannot completely be excluded. A follow-up  CT scan of the chest is recommended in 3-4 months to assess for the    resolution of the lymphadenopathy.       CHEST WALL: Normal.   LIMITED ABDOMEN: There is a small hypodense lesion seen about the    falciform ligament of the liver which is too small to characterize. A    tiny calcified granuloma is noted within the spleen.   OTHER: There is moderate rightward convexity scoliosis of the    thoracic spine along with moderate multilevel degenerative change.       CONCLUSION: See above.                              Report Dictated and Electronically Signed By:  Francella Solian on    01/21/2016 at 16:15                    patient CT scan did not reveal any large Main or segmental pulmonary embolisms. She did have small bilateral pleural effusions. She also had findings of pulmonary edema and cardiomegaly. She did have a few areas of patchy opacities in both lungs which is likely atelectasis or scarring per radiologist. She did have some atherosclerosis. She had multiple mildly prominent mediastinal and bilateral hilar lymph nodes which may be reactive. However other etiologies exist. Follow-up CT scan is recommended in the next 3-4 months. She also had hypodense lesion in her liver.    Patient's blood work indicated that her proBNP was elevated there is a possibility of heart failure.    Patient was tachycardic here in the ER. She is given small fluid boluses which seemed to improve her tachycardia. Since she's had a productive cough with opacities on her CT scan there is a question whether or not she may have pneumonia. She was ordered Rocephin and azithromycin here in the ER for the possibility of pneumonia.    I did place another call to the on-call cardiologist for recommendations for medical treatment with patient's abnormal vitals and findings of heart failure. Unfortunately he was not able speak with me.    I did speak with the hospitalist service, Elby Showers N.P., who is willing to assist with admission the hospital. We did discuss for his medical treatment for patient. At this point time he like me to discontinue the antibiotics. He is comfortable with aspirin only at this point in time. He will discuss case with the on-call cardiologist regarding ongoing treatment options for this patient.    I appreciate the hospital service 1 is to admit patient to hospital for ongoing evaluation and treatment  ECG:   An ECG was evaluated and shows sinus tachycardia with a rate of 124 bpm. Normal axis. ST depression in V4 through V6. Inferior/lateral ST/T-wave changes. QRS 100 ms. PR interval 128 ms. QTC of 426  ms.      Medical Decision Making:       Disposition/Condition:  Admission/guarded    Impression:    SNOMED CT(R)   1. Other chest pain  CHEST PAIN   2. Dyspnea and respiratory abnormality  DYSPNEA   3. Elevated troponin  HIGH TROPONIN I LEVEL   4. Abnormal EKG  ELECTROCARDIOGRAM ABNORMAL   5. Pleural effusion  PLEURAL EFFUSION   6. Opacity of lung on imaging study  ABNORMAL FINDINGS ON DIAGNOSTIC IMAGING OF LUNG   7. Elevated brain natriuretic peptide (BNP) level  HORMONE LEVEL - FINDING  8. Liver lesion  LESION OF LIVER         NOTE:  This dictation was produced using voice recognition software. Although effort has been made to minimize transcription errors, homonyms and other transcription errors may be present and may not truly reflect my intent.       Vidal Schwalbe, DO  01/21/16 1758

## 2016-01-22 ENCOUNTER — Inpatient Hospital Stay: Admit: 2016-01-22

## 2016-01-22 LAB — ABO/RH (HCLL): Rh (D): POSITIVE

## 2016-01-22 LAB — BASIC METABOLIC PANEL
Anion Gap: 22 mmol/L — ABNORMAL HIGH (ref 12.0–20.0)
BUN / Creatinine Ratio: 25.3 — ABNORMAL HIGH (ref 12.0–20.0)
BUN: 19 mg/dL (ref 8–20)
CO2 - Carbon Dioxide: 18 mmol/L — ABNORMAL LOW (ref 22–32)
Calcium: 9.9 mg/dL (ref 8.6–10.6)
Chloride: 103.1 mmol/L (ref 101.0–111.0)
Creatinine: 0.75 mg/dL (ref 0.60–1.30)
Glomerular Filtration Rate Estimate: >60 mL/min/{1.73_m2} (ref >=60.0)
Glucose: 107 mg/dL — ABNORMAL HIGH (ref 74–106)
Osmolality Calculation: 282 mOsm/kg (ref 275.0–300.0)
Potassium: 3.48 mmol/L — ABNORMAL LOW (ref 3.50–5.10)
Sodium: 140 mmol/L (ref 135–145)

## 2016-01-22 LAB — IRON AND TIBC
Iron Saturation: 2 % — ABNORMAL LOW (ref 20–55)
Iron: 10 ug/dL — ABNORMAL LOW (ref 28–170)
Total Iron Binding Capacity (Transferrin Saturation): 463 ug/dL (ref 261–487)
Transferrin: 324 mg/dL (ref 192–382)

## 2016-01-22 LAB — TROPONIN T
Troponin T: 0.09 ng/mL (ref 0.00–0.01)
Troponin T: 0.08 ng/mL (ref 0.00–0.01)
Troponin T: 0.06 ng/mL (ref 0.00–0.01)

## 2016-01-22 LAB — CBC WITHOUT DIFFERENTIAL
HCT: 32.1 % — ABNORMAL LOW (ref 35.0–48.0)
Hemoglobin: 10.1 g/dL — ABNORMAL LOW (ref 11.7–16.5)
MCH: 22.3 pg — ABNORMAL LOW (ref 28.3–33.3)
MCHC: 31.4 g/dL — ABNORMAL LOW (ref 32.5–36.0)
MCV: 71 fL — ABNORMAL LOW (ref 81.0–100.0)
MPV: 8.3 fL (ref 6.9–10.0)
Platelet Count: 427 10*3/ÂµL — ABNORMAL HIGH (ref 150–405)
RBC: 4.53 10*6/ÂµL (ref 3.80–5.60)
RDW: 19.3 % — ABNORMAL HIGH (ref 11.7–16.1)
WBC: 11.5 10*3/ÂµL — ABNORMAL HIGH (ref 4.5–11.0)

## 2016-01-22 LAB — LIPID PANEL
Chol/HDL Ratio: 4.6 (ref 0.0–5.0)
Cholesterol, HDL: 19 mg/dL — ABNORMAL LOW (ref >=60)
Cholesterol: 87 mg/dL (ref <=200)
LDL Calculated: 52 mg/dL (ref 0–129)
Triglyceride: 79 mg/dL (ref <=150.0)
VLDL Cholesterol Calculation: 15.8 mg/dL (ref 7.0–32.0)

## 2016-01-22 LAB — ANTIBODY SCREEN (HCLL): Antibody Screen: NEGATIVE

## 2016-01-22 LAB — PROTIME-INR
INR: 1.6 {INR} — ABNORMAL HIGH (ref 0.9–1.1)
Protime: 17.1 s — ABNORMAL HIGH (ref 10.0–13.2)

## 2016-01-22 LAB — CKMB
CKMB: 2.1 ng/mL (ref 0.6–6.3)
CKMB: 2.4 ng/mL (ref 0.6–6.3)
CKMB: 2.3 ng/mL (ref 0.6–6.3)

## 2016-01-22 LAB — GLYCO-HEMOGLOBIN A1C: Glycohemoglobin (A1c): 5.2 %

## 2016-01-22 LAB — TSH: TSH - Thyroid Stimulating Hormone: 2.09 u[IU]/mL (ref 0.34–5.60)

## 2016-01-22 LAB — PROCALCITONIN: Procalcitonin: <0.05 ng/mL (ref <=0.09)

## 2016-01-22 LAB — MAGNESIUM
Magnesium: 1.6 mg/dL (ref 1.3–2.5)
Magnesium: 1.6 mg/dL (ref 1.3–2.5)

## 2016-01-22 LAB — FERRITIN: Ferritin: 19 ng/mL (ref 13–150)

## 2016-01-22 MED ORDER — atorvastatin (LIPITOR) tablet 20 mg
20 | Freq: Every evening | ORAL | Status: DC
Start: 2016-01-22 — End: 2016-01-23
  Administered 2016-01-23: 04:00:00 20 mg via ORAL

## 2016-01-22 MED ORDER — furosemide (LASIX) injection 20 mg
10 | Freq: Once | INTRAMUSCULAR | Status: AC
Start: 2016-01-22 — End: 2016-01-22
  Administered 2016-01-22: 18:00:00 10 mg via INTRAVENOUS

## 2016-01-22 MED ORDER — guaiFENesin (MUCINEX) 12 hr tablet 600 mg
600 | Freq: Two times a day (BID) | ORAL | Status: DC
Start: 2016-01-22 — End: 2016-01-23
  Administered 2016-01-22 – 2016-01-23 (×4): 600 mg via ORAL

## 2016-01-22 MED ORDER — ipratropium-albuterol (DUO-NEB) 0.5 mg-3 mg(2.5 mg base)/3 mL nebulizer solution 3 mL
0.5 | Freq: Once | RESPIRATORY_TRACT | Status: DC | PRN
Start: 2016-01-22 — End: 2016-01-21

## 2016-01-22 MED ORDER — sodium chloride 0.9 %
INTRAVENOUS | Status: DC
Start: 2016-01-22 — End: 2016-01-22
  Administered 2016-01-22 (×4): via INTRAVENOUS

## 2016-01-22 MED ORDER — furosemide (LASIX) injection 20 mg
10 | Freq: Once | INTRAMUSCULAR | Status: AC
Start: 2016-01-22 — End: 2016-01-21
  Administered 2016-01-22: 02:00:00 10 mg via INTRAVENOUS

## 2016-01-22 MED ORDER — furosemide (LASIX) injection 20 mg
10 | INTRAMUSCULAR | Status: DC
Start: 2016-01-22 — End: 2016-01-22
  Administered 2016-01-22 (×2): 10 mg via INTRAVENOUS

## 2016-01-22 MED ORDER — HYDROcodone-acetaminophen (NORCO) 5-325 mg per tablet 1-2 tablet
5-325 | ORAL | Status: DC | PRN
Start: 2016-01-22 — End: 2016-01-23

## 2016-01-22 MED ORDER — nitroglycerin (NITROSTAT) SL tablet 0.4 mg
0.4 | SUBLINGUAL | Status: DC | PRN
Start: 2016-01-22 — End: 2016-01-23

## 2016-01-22 MED ORDER — sodium chloride (NS) 0.9% bolus 500 mL
Freq: Once | INTRAVENOUS | Status: AC
Start: 2016-01-22 — End: 2016-01-21
  Administered 2016-01-22 (×2): via INTRAVENOUS

## 2016-01-22 MED ORDER — potassium chloride SA (K-DUR,KLOR-CON) CR tablet 40 mEq
20 | Freq: Every day | ORAL | Status: DC
Start: 2016-01-22 — End: 2016-01-23
  Administered 2016-01-22 – 2016-01-23 (×2): 20 meq via ORAL

## 2016-01-22 MED ORDER — furosemide (LASIX) tablet 20 mg
20 | ORAL | Status: DC
Start: 2016-01-22 — End: 2016-01-22

## 2016-01-22 MED ORDER — ceftriaxone (ROCEPHIN) 1 g in 50 mL 0.9 % NaCl SNAP IVPB
INTRAMUSCULAR | Status: DC
Start: 2016-01-22 — End: 2016-01-22
  Administered 2016-01-22 (×2): via INTRAVENOUS

## 2016-01-22 MED ORDER — azithromycin (ZITHROMAX) 500 mg in 250 mL 0.9 % NaCl SNAP IVPB
Freq: Once | INTRAVENOUS | Status: DC
Start: 2016-01-22 — End: 2016-01-21

## 2016-01-22 MED ORDER — acetaminophen (TYLENOL) tablet 650 mg
325 | ORAL | Status: DC | PRN
Start: 2016-01-22 — End: 2016-01-23

## 2016-01-22 MED ORDER — racepinephrine (VAPONEFRIN) 2.25 % nebulizer solution 0.5 mL
2.25 | Freq: Once | RESPIRATORY_TRACT | Status: DC | PRN
Start: 2016-01-22 — End: 2016-01-21

## 2016-01-22 MED ORDER — ceftriaxone (ROCEPHIN) 1 g in 50 mL 0.9 % NaCl SNAP IVPB
Freq: Once | INTRAMUSCULAR | Status: DC
Start: 2016-01-22 — End: 2016-01-21
  Administered 2016-01-22 (×2): via INTRAVENOUS

## 2016-01-22 MED ORDER — albuterol (PROVENTIL) nebulizer solution 2.5 mg
2.5 | Freq: Once | RESPIRATORY_TRACT | Status: DC | PRN
Start: 2016-01-22 — End: 2016-01-21
  Administered 2016-01-22: 03:00:00 2.5 mg via RESPIRATORY_TRACT

## 2016-01-22 MED ORDER — cyclobenzaprine (FLEXERIL) tablet 15 mg
10 | Freq: Once | ORAL | Status: DC
Start: 2016-01-22 — End: 2016-01-21

## 2016-01-22 MED ORDER — heparin injection 5,000 Units
10000 | Freq: Three times a day (TID) | INTRAMUSCULAR | Status: DC
Start: 2016-01-22 — End: 2016-01-23
  Administered 2016-01-22 – 2016-01-23 (×4): via SUBCUTANEOUS

## 2016-01-22 MED ORDER — carvedilol (COREG) tablet 3.125 mg
3.125 | Freq: Two times a day (BID) | ORAL | Status: DC
Start: 2016-01-22 — End: 2016-01-23
  Administered 2016-01-22 – 2016-01-23 (×2): 3.125 mg via ORAL

## 2016-01-22 MED ORDER — Normal saline infusion for medication/blood product administration for nursing
0.9 | INTRAVENOUS | Status: DC | PRN
Start: 2016-01-22 — End: 2016-01-23

## 2016-01-22 MED ORDER — digoxin (LANOXIN) tablet 250 mcg
250 | Freq: Once | ORAL | Status: AC
Start: 2016-01-22 — End: 2016-01-21
  Administered 2016-01-22: 02:00:00 250 ug via ORAL

## 2016-01-22 MED ORDER — atorvastatin (LIPITOR) tablet 80 mg
80 | Freq: Once | ORAL | Status: AC
Start: 2016-01-22 — End: 2016-01-21
  Administered 2016-01-22: 02:00:00 80 mg via ORAL

## 2016-01-22 MED ORDER — esmolol (BREVIBLOC) 2500 mg in NaCl 0.9% 250 mL (10 mg/mL) premix infusion
2500 | INTRAVENOUS | Status: DC | PRN
Start: 2016-01-22 — End: 2016-01-21

## 2016-01-22 MED ORDER — albuterol (PROVENTIL) nebulizer solution 2.5 mg
2.5 | RESPIRATORY_TRACT | Status: DC | PRN
Start: 2016-01-22 — End: 2016-01-23

## 2016-01-22 MED ORDER — ferrous sulfate tablet 325 mg
325 | Freq: Two times a day (BID) | ORAL | Status: DC
Start: 2016-01-22 — End: 2016-01-23
  Administered 2016-01-22 – 2016-01-23 (×3): 325 mg via ORAL

## 2016-01-22 MED ORDER — pantoprazole (PROTONIX) EC tablet 40 mg
40 | Freq: Every morning | ORAL | Status: DC
Start: 2016-01-22 — End: 2016-01-23
  Administered 2016-01-22 – 2016-01-23 (×3): 40 mg via ORAL

## 2016-01-22 MED ORDER — ondansetron (ZOFRAN) injection 4 mg
4 | Freq: Four times a day (QID) | INTRAMUSCULAR | Status: DC | PRN
Start: 2016-01-22 — End: 2016-01-23

## 2016-01-22 MED ORDER — furosemide (LASIX) injection 20 mg
10 | INTRAMUSCULAR | Status: DC
Start: 2016-01-22 — End: 2016-01-23
  Administered 2016-01-22 – 2016-01-23 (×2): 10 mg via INTRAVENOUS

## 2016-01-22 MED ORDER — azithromycin (ZITHROMAX) 500 mg in 250 mL 0.9 % NaCl SNAP IVPB
INTRAVENOUS | Status: DC
Start: 2016-01-22 — End: 2016-01-22
  Administered 2016-01-22 (×2): via INTRAVENOUS

## 2016-01-22 NOTE — Plan of Care (Signed)
Problem: Knowledge Deficit  Goal: Patient/family/caregiver demonstrates understanding of disease process, treatment plan, medications, and discharge instructions  Complete learning assessment and assess knowledge base.   Outcome: Progressing  Pt has been updated on plan of care, pain management and encouraged to call for assist. Pt denies any questions. Cont. To assess for education needs and educate as opportunity presents.     Problem: Hemodynamic Status  Goal: Patient's vitals signs are stable  Assess and monitor patient's heart rate, rhythm, respiratory rate, peripheral pulses, capillary refill, color, body temperature, intake and output, labs and physical activity tolerance. Observe for signs of chest pain (note location, duration, severity, radiation and associated symptoms such as diaphoresis, nausea, indigestion). Monitor for signs and symptoms of heart failure (eg. shortness of breath, edema of feet/ankles/legs, rapid irregular heart rate, coughing, wheezing, white/pink blood tinged sputum, sudden weight gain, chest pain). Collaborate with interdisciplinary team and initiate plan and interventions as ordered.   Outcome: Progressing  Pt remains with vitals wnl. Cont. To assess and monitor.    Problem: Excessive Fluid Volume  Goal: Fluid and electrolyte balance are achieved/maintained  Assess and monitor vitals signs (hemodynamic parameters such as CVP, MAP, PAP, PCWP, and CO if applicable), fluid intake and output, urine color, labs, respiratory status, edema, jugular venous distention, and mental status. Monitor for signs and symptoms of hypervolemia (strong rapid pulse, shortness of breath, difficulty breathing lying down, crackles heard in lung fields, edema). Collaborate with interdisciplinary team and initiate plan and interventions as ordered.   Outcome: Progressing  Pt tolerating po intake with adequate output. Labs as per emr. Cont. To assess and monitor.    Problem: Activity Intolerance/Impaired  Mobility  Goal: Patient will achieve/maintain normal respiratory rate/effort  Assess and monitor respiratory rate, effort, breathing pattern and oxygenation as ordered or per policy. Monitor patient for restlessness, anxiety, air hunger. Assess physical activity tolerance. Collaborate with interdisciplinary team and initiate plans and interventions as needed.   Outcome: Progressing  Pt up in room with 1 assist and reports better breathing. Cont. To assess and monitor.

## 2016-01-22 NOTE — Consults (Addendum)
Consult Note   Name: Brandy [name]  DOB: 1956-06-12 60 y.o.  MRN: [id number]  CSN: [id number]  ADMISSION PROVIDER: Ian Malkin, 01/22/2016  PCP: NO FAMILY PHYSICIAN,    Requesting Physician  Bufford Buttner MD    Reason for consultation  Acute heart failure    HISTORY:   CHIEF COMPLAINT    Shortness Of Breath    HPI    Brandy [name] is a 60 y.o. female who presents with severe shortness of breath that has been getting progressively worse over the last 2 months; however, she noticed that things are getting worse since 11/2015.  She noticed that her tolerance of physical activity has been declining to the point that she is sleeping in a recliner, unable to take a few steps before she gets really short of breath and needs to find a place to rest for a few minutes before she can recover and actually continue with her activities.  She is trying to stay active, but was not able to do activities that she was able to do a year ago.  She feels profoundly fatigued, weak, had some very mild swelling surrounding her ankles, but not major weight gain.  Did not really have any chest pain; no palpitation, no blackout spell, but sometimes she feels fatigued and tired to the point that she feels that she may fall down.  Patient's husband and family members noticed that her tolerance of physical activity has been declining.  She has marked shortness of breath and weakness.  She was found also to have some anemia.  Her WBCs were also elevated at 12.2.  The patient was also found to have some slight cardiac enzyme elevation, markedly elevated BNP level in the range of heart failure.    She was able to travel for graduation and able to do some little activities, but the last few weeks, she is markedly fatigued, has a lot of coughing fits, frothy sputum production and bilateral wheezing.       PAST MEDICAL HISTORY:  Patient claimed that she had never been sick as a child.  She had some tonsillitis and tonsillectomy done when she was a  young adult.  She had infection 3 years ago requiring antibiotics, after that she developed severe reaction and was admitted in [facility].  The documentation at the time of my evaluation is not available, but she was advised at that time to have her heart checked and she does not remember having a murmur previously.         SURGICAL HISTORY    Past Surgical History:   Procedure Laterality Date   . TONSILLECTOMY     . TUBAL LIGATION         ALLERGIES    Sulfa (sulfonamide antibiotics)    CURRENT MEDICATIONS    Prior to Admission medications    Medication Sig Start Date End Date Taking? Authorizing Provider   naproxen sodium (ALEVE) 220 MG tablet Take 220 mg by mouth daily as needed.   Yes Historical Provider, MD       FAMILY HISTORY        Both parents had some cardiovascular problems.  Her father died in his 39s or 38s with complication of congestive heart failure and stroke.  Also, her mom was diagnosed in her 65s or 36s for congestive heart failure.  No history of sudden cardiac death in early age and no history of valvular problems in her family members.  SOCIAL HISTORY    Social History   Substance Use Topics   . Smoking status: Never Smoker   . Smokeless tobacco: Never Used   . Alcohol use No       There is no immunization history on file for this patient.    REVIEW OF SYSTEMS    See HPI for further details.  All other systems were reviewed and are negative.  Genera: diaphoresis, profound fatigue, weight loss last 3 months  CNS: no focal finding no TIA, CVA, falls  CARDIO as per H&P  RESP: wheezing, productive cough, SOB  GI: no melena, no diarrhoe  GU: no kidney stones or incontinence  MUSK: no back pain, myalgias  PSYCH: negative for depression   HEM: no prior Hx of bleeding,   Endo: no hx of thyroid, diabetes.    PHYSICAL EXAM:   VITAL SIGNS:                                              MOST RECENT VITAL SIGNS   Temp Blood Pressure Heart Rate Resp Rate O2 Sats   Temp: 36.8 ?C (98.2 ?F) BP: 116/82  Pulse: (!) 117 Resp: 16   SpO2: 93 % on  L/min      Admission Weight: Weight: 102.5 kg (226 lb)       BMI: Body mass index is 36.4 kg/(m^2).    Exam:    General appearance: oriented to person, place, and time and normal appearing weight.    CV exam: normal rate and regular rhythm, systolic murmur 3/6 at 2nd left intercostal space. JVP moderately elevated    Chest: clear to auscultation, no wheezes, rales or rhonchi, symmetric air entry.     Abdominal exam: soft, nontender, nondistended, no masses or organomegaly.    Exam of extremities: peripheral pulses normal, no pedal edema, no clubbing or cyanosis    Skin: Skin color, texture, turgor normal. No rashes or lesions    Neuro: LABS    Results for orders placed or performed during the hospital encounter of 01/21/16 (from the past 24 hour(s))   POC Troponin I -Next Routine    Collection Time: 01/21/16  2:24 PM   Result Value Ref Range    POC Troponin 0.15 (H) 0.00 - 0.08 ng/mL   POC Chem 8 -Next Routine    Collection Time: 01/21/16  2:38 PM   Result Value Ref Range    POC Sodium 138 135 - 145 mmol/L    POC Potassium 3.5 3.5 - 5.1 mmol/L    POC Chloride 105 101 - 111 mmol/L    POC Ionized Calcium 5.3 4.5 - 5.3 mg/dL    POC BUN 22 (H) 6 - 20 mg/dL    POC Creatinine 0.8 0.6 - 1.3 mg/dL    POC Hematocrit 31 (L) 35 - 48 %PCV    POC Hemoglobin 10.5 (L) 11.7 - 16.5 g/dL    POC Total CO2 19 (L) 22 - 32 mmol/L    POC Anion Gap 18 12 - 20 mmol/L    POC Glucose 123 (H) 74 - 106 mg/dL   Pro BNP (SLM) -STAT    Collection Time: 01/21/16  2:45 PM   Result Value Ref Range    Pro B-Type Natriuretic Peptide 5485 (H) 0 - 900 pg/mL   Hepatic Function Panel -STAT    Collection Time: 01/21/16  2:45 PM   Result Value Ref Range    Albumin 3.6 3.5 - 5.0 g/dL    Bilirubin Direct <7.8 0.0 - 0.3 mg/dL    Alkaline Phosphatase 87 35 - 105 U/L    AST - Aspartate Aminotransferase 29 5 - 32 U/L    ALT - Alanine Amino transferase 35 (H) 5 - 33 U/L    Protein Total 7.0 6.1 - 7.9 g/dL    Bilirubin Total  2.95 0.10 - 1.70 mg/dL   Troponin T -STAT    Collection Time: 01/21/16  2:45 PM   Result Value Ref Range    Troponin T 0.08 (HCrit) 0.00-<0.01 ng/mL   CK (Creatine Kinase) -STAT    Collection Time: 01/21/16  2:45 PM   Result Value Ref Range    CK (Creatine Kinase) 64 26 - 192 U/L   CKMB (SLM) -STAT    Collection Time: 01/21/16  2:45 PM   Result Value Ref Range    CKMB 2.4 0.6 - 6.3 ng/mL   Myoglobin -STAT    Collection Time: 01/21/16  2:45 PM   Result Value Ref Range    Myoglobin 50 25 - 58 ng/mL   CBC with Auto Differential -STAT    Collection Time: 01/21/16  2:45 PM   Result Value Ref Range    WBC 12.2 (H) 4.5 - 11.0 10*3/?L    RBC 4.26 3.80 - 5.60 10*6/?L    Hemoglobin 8.6 (L) 11.7 - 16.5 g/dL    HCT 62.1 (L) 30.8 - 48.0 %    MCV 68.0 (L) 81.0 - 100.0 fL    MCH 20.3 (L) 28.3 - 33.3 pg    MCHC 29.8 (L) 32.5 - 36.0 g/dL    RDW 65.7 (H) 84.6 - 16.1 %    Platelet Count 487 (H) 150 - 405 10*3/?L    MPV 8.4 6.9 - 10.0 fL   Procalcitonin -Next Routine    Collection Time: 01/21/16  2:45 PM   Result Value Ref Range    Procalcitonin <0.05 <=0.09 ng/mL   Cellavision Differential -Next Routine    Collection Time: 01/21/16  2:45 PM   Result Value Ref Range    WBC 12.2 (H) 4.5 - 11.0 10*3/?L    Neutrophils % 79 (H) 50 - 74 %    Lymphocytes % 15 (L) 20 - 40 %    Monocytes % 4 2 - 12 %    Eosinophils % 0 0 - 5 %    Basophils % 2 0 - 2 %    Neutrophils, Absolute 9.7 (H) 2.0 - 7.4 10*3/?L    Lymphocytes, Absolute 1.8 1.0 - 4.0 10*3/?L    Monocytes, Absolute 0.5 0.2 - 1.0 10*3/?L    Eosinophils, Absolute 0.0 0.0 - 0.4 10*3/?L    Basophils, Absolute 0.2 (H) 0.0 - 0.1 10*3/?L    Total Cells Counted 100     Platelet Estimate Increased (A) Adequate    Anisocytosis 2+     Poikilocytosis 1+     Polychromasia 1+     Hypochromasia 1+     Microcytes 2+     Ovalocytes 1+     nRBC 2 (H) 0 #/100 WBC   CRP, High-Sensitivity -ASAP    Collection Time: 01/21/16  2:45 PM   Result Value Ref Range    CRP (High Sensitivity) 8.780 (H) 0.000 - 0.748  mg/dL   Reticulocyte Count -ASAP    Collection Time: 01/21/16  2:45 PM   Result Value Ref Range  Reticulocyte Relative Percent 2.9 (H) 1.0 - 2.3 %    Reticulocyte Absolute Count 0.121 (H) 0.037 - 0.109 10*6/?L   Lactic Acid, Plasma -STAT    Collection Time: 01/21/16  3:11 PM   Result Value Ref Range    Lactic Acid 1.5 0.5 - 2.2 mmol/L   Prepare RBC -STAT    Collection Time: 01/21/16  6:25 PM   Result Value Ref Range    Blood Product Code [id number]     Blood Product Unit ID [id number]     Blood Product Unit ABO O     Blood Product Unit Rh POS     Blood Product Unit Crossmatch Compatible     Blood Product Status Issued     Blood Product Unit [facility]     Blood Product Unit Expiration [id number]     Blood Product Blood Type Barcode 5100     Blood Product Code Description E0336 AS-1 RBC LR     Blood Product Code E0336V00     Blood Product Unit ID [id number]     Blood Product Unit ABO O     Blood Product Unit Rh POS     Blood Product Unit Crossmatch Compatible     Blood Product Status Issued     Blood Product Unit [facility]     Blood Product Unit Expiration [id number]     Blood Product Blood Type Barcode 5100     Blood Product Code Description E0336 AS-1 RBC LR     Blood Product Code I3474Q59     Blood Product Unit ID [id number]     Blood Product Unit ABO O     Blood Product Unit Rh POS     Blood Product Unit Crossmatch Compatible     Blood Product Status Unit Crossmatched - Ready to Transfuse     Blood Product Unit [facility]     Blood Product Unit Expiration [id number]     Blood Product Blood Type Barcode 5100     Blood Product Code Description E0336 AS-1 RBC LR     Blood Product Code W1093A35     Blood Product Unit ID [id number]     Blood Product Unit ABO O     Blood Product Unit Rh POS     Blood Product Unit Crossmatch Compatible     Blood Product Status Unit Crossmatched - Ready to Transfuse     Blood Product Unit [facility]     Blood Product Unit Expiration [id number]     Blood  Product Blood Type Barcode 5100     Blood Product Code Description E0336 AS-1 RBC LR    Glyco-Hemoglobin A1C -ASAP    Collection Time: 01/21/16  7:00 PM   Result Value Ref Range    % GLYCOHEMOGLOBIN (A1C) 5.2 %   Magnesium -Daily    Collection Time: 01/21/16  7:00 PM   Result Value Ref Range    Magnesium 1.6 1.3 - 2.5 mg/dL   Ferritin -ASAP    Collection Time: 01/21/16  7:00 PM   Result Value Ref Range    Ferritin 19 13 - 150 ng/mL   Iron & Total Iron Binding Capacity -ASAP    Collection Time: 01/21/16  7:00 PM   Result Value Ref Range    Total Iron Binding Capacity (Transferrin Saturation) 463 261 - 487 ug/dL    Iron 10 (L) 28 - 607 ug/dL    Iron Saturation 2 (L) 20 - 55 %    Transferrin 324 192 -  382 mg/dL   ABO/Rh -Once    Collection Time: 01/21/16  7:03 PM   Result Value Ref Range    ABO O     Rh (D) Positive    Antibody Screen -Once    Collection Time: 01/21/16  7:03 PM   Result Value Ref Range    Antibody Screen Negative    Troponin T -Q6H    Collection Time: 01/21/16 10:14 PM   Result Value Ref Range    Troponin T 0.09 (HCrit) 0.00-<0.01 ng/mL   CKMB (SLM) -Q6H    Collection Time: 01/21/16 10:14 PM   Result Value Ref Range    CKMB 2.1 0.6 - 6.3 ng/mL   Basic Metabolic Panel -Daily    Collection Time: 01/22/16  4:44 AM   Result Value Ref Range    Sodium 140 135 - 145 mmol/L    Potassium 3.48 (L) 3.50 - 5.10 mmol/L    Chloride 103.1 101.0 - 111.0 mmol/L    CO2 - Carbon Dioxide 18 (L) 22 - 32 mmol/L    Creatinine 0.75 0.60 - 1.30 mg/dL    BUN 19 8 - 20 mg/dL    BUN / Creatinine Ratio 25.3 (H) 12.0 - 20.0    Glucose 107 (H) 74 - 106 mg/dL    Calcium 9.9 8.6 - 16.1 mg/dL    Anion Gap 09.6 (H) 04.5 - 20.0 mmol/L    Glomerular Filtration Rate Estimate >60.0 >=60.0 mL/min/1.75m*2    GFR Additional Info      Osmolality Calculation 282.0 275.0 - 300.0 mOsm/kg   CBC Without Differential -Daily    Collection Time: 01/22/16  4:44 AM   Result Value Ref Range    WBC 11.5 (H) 4.5 - 11.0 10*3/?L    RBC 4.53 3.80 - 5.60  10*6/?L    Hemoglobin 10.1 (L) 11.7 - 16.5 g/dL    HCT 40.9 (L) 81.1 - 48.0 %    MCV 71.0 (L) 81.0 - 100.0 fL    MCH 22.3 (L) 28.3 - 33.3 pg    MCHC 31.4 (L) 32.5 - 36.0 g/dL    RDW 91.4 (H) 78.2 - 16.1 %    Platelet Count 427 (H) 150 - 405 10*3/?L    MPV 8.3 6.9 - 10.0 fL   Lipid Panel [facility]) -AM Draw    Collection Time: 01/22/16  4:44 AM   Result Value Ref Range    Triglyceride 79.0 <=150.0 mg/dL    Cholesterol 87 <=956 mg/dL    Cholesterol, HDL 19 (L) >=60 mg/dL    LDL Calculated 52 0 - 129 mg/dL    Chol/HDL Ratio 4.6 0.0 - 5.0    VLDL Cholesterol 15.8 7.0 - 32.0 mg/dL   Protime Panel -AM Draw    Collection Time: 01/22/16  4:44 AM   Result Value Ref Range    Protime 17.1 (H) 10.0 - 13.2 sec    INR 1.6 (H) 0.9 - 1.1 INR   Thyroid Stimulating Hormone -AM Draw    Collection Time: 01/22/16  4:44 AM   Result Value Ref Range    TSH - Thyroid Stimulating Hormone 2.09 0.34 - 5.60 uIU/mL   Magnesium -Daily    Collection Time: 01/22/16  4:44 AM   Result Value Ref Range    Magnesium 1.6 1.3 - 2.5 mg/dL   Troponin T -O1H    Collection Time: 01/22/16  4:44 AM   Result Value Ref Range    Troponin T 0.08 (HCrit) 0.00-<0.01 ng/mL   CKMB (SLM) -Q6H  Collection Time: 01/22/16  4:44 AM   Result Value Ref Range    CKMB 2.4 0.6 - 6.3 ng/mL   Procalcitonin -AM Draw    Collection Time: 01/22/16  4:44 AM   Result Value Ref Range    Procalcitonin <0.05 <=0.09 ng/mL   Troponin T -Q6H    Collection Time: 01/22/16 10:50 AM   Result Value Ref Range    Troponin T 0.06 (HCrit) 0.00-<0.01 ng/mL   CKMB (SLM) -Q6H    Collection Time: 01/22/16 10:50 AM   Result Value Ref Range    CKMB 2.3 0.6 - 6.3 ng/mL     IMAGING FOR LAST 24 HOURS    X-ray chest PA or AP   Final Result      CT angiogram pulmonary   Final Result      Echo 2D complete doppler color flow    (Results Pending)   Ultrasound carotid bilateral duplex    (Results Pending)       ASSESSMENT:   DIAGNOSIS/Plan:      Acute-on-chronic systolic heart failure - patient has marked systolic  dysfunction noted on 2D echocardiogram with global left ventricular systolic function somewhere around 25% to 30% with global hypokinesis.  Patient also has significant aortic valve stenosis with peak flow velocity reaching 4 m/sec, consistent with severe/critical aortic stenosis.  Considering that she still has anemia, the Doppler signal may be falsely elevated, but still in the range of severe/critical aortic stenosis.  Since the patient responded to mild decongestion, I had quite extensive discussion with the patient and family members about findings on echocardiogram suggesting possibility of severe aortic stenosis and systolic dysfunction, possibly related to either valvular heart failure or coronary artery disease, and we discussed the option to consider right and left cardiac catheterization to reassess her anatomy and decide about possible aortic valve replacement and surgical revascularization.  After reviewing the risks and the benefits, we decided to proceed with diagnostic angiogram on 01/23/2016 from the right femoral access.  We will assess pulmonary hypertension, coronary anatomy and valve area and decide about potential transfer to [facility] for either surgical replacement or possibly TAVR versus balloon valvuloplasty until the patient's ejection fraction improved.  If her blood pressure gets better, we can try a small dose of diuretics, Lasix, and small dose of beta blocker to see if we can improve her symptoms.  She may need oxygen.  The risks and benefits of the right and left cardiac catheterization were discussed with the patient and family members.  Further recommendation after right and left cardiac catheterization.     KUCRA:BECKDA                     Ian Malkin, MD  D:  01/22/2016 12:34:55            T:  01/22/2016 13:06:05            V/D:  [id number]              E:  /                                       Pertinent Labs & Imaging studies reviewed. (See  chart for details)   Plan of care was discussed with patient and/or family.    Electronically signed by Kaylyn Lim KUCINSKY8/12/201712:24 PM  Cc:CC Providers:    PCP  NO FAMILY  PHYSICIAN,  Attending  Eldridge Abrahams, MD  Admitting  Eldridge Abrahams, MD  Referring Provider  No ref. provider found        Electronically signed:Dexton Zwilling KUCINSKY8/12/201712:24 PM

## 2016-01-22 NOTE — Plan of Care (Signed)
Problem: Knowledge Deficit  Goal: Patient/family/caregiver demonstrates understanding of disease process, treatment plan, medications, and discharge instructions  Complete learning assessment and assess knowledge base.  Outcome: Progressing  Discuss POC for shift to include monitoring V/S, reporting CP/SOB/Pressure, blood administration, CHF daily weight/increase in edema/swelling/increase in SOB.  Will continue to monitor and assess.    Problem: Hemodynamic Status  Goal: Patient's vitals signs are stable  Assess and monitor patient's heart rate, rhythm, respiratory rate, peripheral pulses, capillary refill, color, body temperature, intake and output, labs and physical activity tolerance. Observe for signs of chest pain (note location, duration, severity, radiation and associated symptoms such as diaphoresis, nausea, indigestion). Monitor for signs and symptoms of heart failure (eg. shortness of breath, edema of feet/ankles/legs, rapid irregular heart rate, coughing, wheezing, white/pink blood tinged sputum, sudden weight gain, chest pain). Collaborate with interdisciplinary team and initiate plan and interventions as ordered.  Outcome: Progressing  Pt receiving 2 units PRBCs with improved HR and respiratory rate.  Pt also reports not feeling as dyspneic with activity.  Denies CP/Pressure.  Will continue to monitor and asess.    Problem: Excessive Fluid Volume  Goal: Fluid and electrolyte balance are achieved/maintained  Assess and monitor vitals signs (hemodynamic parameters such as CVP, MAP, PAP, PCWP, and CO if applicable), fluid intake and output, urine color, labs, respiratory status, edema, jugular venous distention, and mental status. Monitor for signs and symptoms of hypervolemia (strong rapid pulse, shortness of breath, difficulty breathing lying down, crackles heard in lung fields, edema). Collaborate with interdisciplinary team and initiate plan and interventions as ordered.  Outcome: Progressing  Pt  receiving IV Lasix after each unit of PRBCs, PRBCs infused over 3 hours. U/O is adequate.  WIll continue to monitor and assess.    Problem: Activity Intolerance/Impaired Mobility  Goal: Patient will achieve/maintain normal respiratory rate/effort  Assess and monitor respiratory rate, effort, breathing pattern and oxygenation as ordered or per policy. Monitor patient for restlessness, anxiety, air hunger. Assess physical activity tolerance. Collaborate with interdisciplinary team and initiate plans and interventions as needed.   Outcome: Progressing  Pt is dyspneic with exertion at this time.  Improved from dyspneic even at rest.  Will continue to monitor and assess.

## 2016-01-22 NOTE — Progress Notes (Signed)
Message left with md mirandes answering service to call pt and schedule an outpatient egd/colonoscopy.

## 2016-01-22 NOTE — Other (Signed)
IP PT EVALUATION / INITIAL TREATMENT (GENERAL)    [name] / [id number] DOB:  25-Jul-1955 / 60 y.o. Date:  01/22/2016     Precautions  Specific mobility precautions: None     ASSESSMENT     Summary:  Patient seen for evaluation s/p admission from EDU with c/o shortness of breath and decreased ability to tolerate walking. Mobility assessed from bed level with transfers and gait using fww and monitoring vitals (HR and O2 sats) frequent cues provided for proper breathing techniques to decrease SOB, which was primarily noted when patient talking.  O2 sats remained above 90% throughout however HR elevated to 131 post walking 50 ft therefore activity limited     Activity tolerance: Tolerates < 10 min activity with changes in vital signs  Comments: increased HR, SOB     Rehab Potential: Fair     Precaution Awareness: No specific precautions  Deficit Awareness: Fully aware of deficits  Safety Judgment: Good awareness of safety     Consult recommendations   Discharge planner   Discharge recommendations  Mobility Equipment needs: To be determined later   Primary recommendation : No skilled PT services expected at time of discharge.  Secondary recommendation: Please refer to Discharge Planner documentation for discharge recommendations.     PLAN     Treatment Plan Frequency     PT 1x/day M-F plus Sat or Sun   .  Frequency will be increased as pt's condition or discharge plan indicates.     Dolly Harbach will remain on the Physical Therapy schedule until goals are met, or there has been a change in the Plan of Care.          Treatment Diagnosis Surgery / # Days Post-op (if applicable)    Primary Dx: acute hypoxia  Treatment Dx: decreased functional status    /  NA       Pertinent Past Medical Hx:   50lb wt loss in couple of months ; patient reports SOB since June however no significant PMH     SUBJECTIVE   Patient concerned about her present status, reports she previously was active with management of her  home and 7 acre property     Pain Level  Current status: Pain does not limit pt's ability to participate in therapy  Pain at start of session: 3  Pain at end of session: 3 (LBP)   Hx of current problem   h/o cough since June and recent decreased tolerance to walking requiring forward bent posture to recover her breathing.    Social history   Lives with: Spouse  Social Activities: Walking/gardening/housework/occasional exercise       Prior LOF  Prior ambulatory status: Walked using furniture/railings/walls  Position tolerance limitations: Walking time/distance limit, due to SOB   Home environment  Living situation: House  Home style: One level  Number of stairs to enter home: 5 (5)  Railings into home: Double railing  Number of stairs inside home: 4  Railings inside home: No railing   Home equipment  Medical Equipment: has fww from husband's previous use  Mobility Equipment: Front wheeled walker (FWW)     OBJECTIVE        Mental status  General Demeanor: Pleasant;Cooperative  LOC: Alert  Orientation: Oriented x 4  Directions: Follows multi-step commands  --> Follows multi-step commands: Consistently  Attention span: Appears intact  Memory: Appears intact in social/therapy situations   Medical appliances  Medical Appliances: Telemetry  Strength  Upper extremities: WFL bilaterally  Lower extremities: WFL bilaterally   ROM  ROM  Upper extremities: WFL bilaterally  Lower extremities: WFL bilaterally     Other  Vitals  1: Position  Position: Lying (with HOB elevated)  Activity: Rest  HR: 119  SPO2: 95  2-6: Position 2  Position 2: Sitting  Activity 2: Rest  HR 2: 126  SPO2 2: 96      TREATMENT  Bed Mobility/Transfers  Bed mobility       Transition to sitting up Supine to Sit: CGA   To resting position      Transition to sitting down CGA   Transfers      Transition to standing From bed: CGA     Method  stand turn with fww      Gait/Stairs    Mobility - Gait (Level)  Gait (distance): 50 feet  Gait (assist level):  CGA  Assistive device: Front wheeled walker (FWW)  Endurance:  (no SOB noted during walking only during talking in sitting)  Pattern:  (no gait deviaitons noted)    Ther. Ex.  LE exercises: Ankle pumps;Heelslides;Seated long arc quads    Balance   static and dynamic standing balance supervised with use of fww, no LOB noted   Training/Education   breathing techniques   Safety/Room Set-up   ?Call button accessible?: Yes  ?Phone accessible?: Yes  ?Oxygen reconnected?: No, on room air  Position on arrival: In bed  Position on departure (upright): In regular chair     GOALS     Patient/Caregiver goals reviewed and integrated with rehab treatment plan:     Multidisciplinary Problems (Active)        Problem: IP General Goal List - 1    Goal Priority Disciplines Outcome   Bed Mobility     PT    Description:  Pt. to perform bed mobility independently.    Transfers     PT    Description:  Patient to perform all functional transfers independently.    Ambulation - Levels     PT    Description:  Patient to ambulate independently x 200 feet with appropriate assistive device.    Ambulation - Stairs     PT    Description:  Pt. to ascend/descend stairs independently, as per discharge environment.    Home Exercise Program     PT    Description:  Pt. to perform basic HEP independently.    Caregiver Training     PT    Description:  Patient/caregiver training will be provided, as needed to achieve goals.             Problem: Patient/Family Goal    Goal Priority Disciplines Outcome   Normal Status     PT    Description:  Pt. wants to return to previous level of function.     Home     PT    Description:  Pt. wants to return to previous living situation.    Mobility     PT    Description:  Patient wants to walk again.    Pain     PT    Description:  Patient wants to be in less pain.    Other     PT                       First session Second session (if applicable)    Start/Stop times: 0900 - [phone]  Start/Stop times:   - Stop Time:     Total  time: 22 minutes  Total time:   minutes     This note to serve as a Discharge Summary if Fatma Rutten is discharged from the hospital or from therapy services.     Therapist: Rayna Sexton, PT, DPT, NCS

## 2016-01-22 NOTE — Progress Notes (Addendum)
Daily Progress Note  [facility] Service   Name: Jenese Mischke  DOB: 03-10-1956 59 y.o.  321/321-01  MRN: [id number]  CSN: [id number]  PCP:NO FAMILY PHYSICIAN,    Shawnese Magner is a 60 y.o. female patient with no significant PMH is admitted to [facility] ON 8/11 with Acute Hypoxic respiratory failure sec to HF exacerbation with NYHA Class 4 symptoms, IDA which is a new diagnosis to her. Also had NSTEMI. She hasn't seen doctors in years, had 50lb wt loss in couple of months and never did age appropriate cancer screening. On admission pt had leucocytosis, Hb 8.6 (got 2 units PRBC), neg procal but CRP of 8.7, BNP 5484, Trops 0.08<0.09<0.09 with neg CPK-MB. CTA neg for PE but showed small b/l pleural effusions R>L, atelectasis and pulm edema, and multiple mildly prominent mediastinal and bilateral hilar lymph nodes. She says since June she has been having some cough with congestion and whitish mucoidal phlegm production. She was started on rocephin and azithromycin empirically to cover for CAP just in case which is discontinued next day given 2 neg procal.     Subjective:     Pt feels much better today, diuresed >1L with 20mg  lasix yday IV. Says earlier she had SOB all the time and couldn't walk at all. Now is able to go to the bathroom at least.   - denies fevers, dysuria, GI/GU bleeding, melena, heart burn, epigastric pain, CP, palpitations.   - no over night events.     ROS    Neg except as above.     Objective:     Vitals Current 24 Hour Min / Max      Temp    36.8 ?C (98.2 ?F)    Temp  Min: 36.3 ?C (97.3 ?F)  Max: 37.1 ?C (98.7 ?F)      BP     116/82     BP  Min: 92/68  Max: 126/85      HR    (!) 117    Pulse  Min: 104  Max: 127      RR    16    Resp  Min: 16  Max: 34      Sats      SpO2: 93 % on  L/min       SpO2  Min: 93 %  Max: 99 %      Weight    102.3 kg (225 lb 8 oz)  Body mass index is 36.4 kg/(m^2).    Admit: 102.5 kg (226 lb)     Physical Exam    Vitals:    01/22/16 1139   BP: 116/82      Pulse: (!) 117   Resp: 16   Temp: 36.8 ?C (98.2 ?F)   SpO2: 93%     Gen: no distress. Speaking in full sentences.   RS: bibasilar creps, R>L  Abd: soft, NT  CVS: s1,s2, esm  CNS: no deficits. AO#4.   MSK: 1+ pitting pedal edema b/l.     Scheduled Medications:  . atorvastatin  20 mg Oral Nightly   . ferrous sulfate  325 mg Oral BID with meals   . [START ON 01/23/2016] furosemide  20 mg Oral 2 times per day   . guaiFENesin  600 mg Oral 2 times per day   . heparin  5,000 Units Subcutaneous Q8H SCH   . pantoprazole  40 mg Oral QAM AC   . potassium chloride SA  40 mEq  Oral Daily     Infusions:      PRN Medications: acetaminophen, albuterol, HYDROcodone-acetaminophen, nitroglycerin, sodium chloride 0.9% (NS), ondansetron  Recent Labs:  Results for orders placed or performed during the hospital encounter of 01/21/16 (from the past 24 hour(s))   POC Troponin I -Next Routine    Collection Time: 01/21/16  2:24 PM   Result Value Ref Range    POC Troponin 0.15 (H) 0.00 - 0.08 ng/mL   POC Chem 8 -Next Routine    Collection Time: 01/21/16  2:38 PM   Result Value Ref Range    POC Sodium 138 135 - 145 mmol/L    POC Potassium 3.5 3.5 - 5.1 mmol/L    POC Chloride 105 101 - 111 mmol/L    POC Ionized Calcium 5.3 4.5 - 5.3 mg/dL    POC BUN 22 (H) 6 - 20 mg/dL    POC Creatinine 0.8 0.6 - 1.3 mg/dL    POC Hematocrit 31 (L) 35 - 48 %PCV    POC Hemoglobin 10.5 (L) 11.7 - 16.5 g/dL    POC Total CO2 19 (L) 22 - 32 mmol/L    POC Anion Gap 18 12 - 20 mmol/L    POC Glucose 123 (H) 74 - 106 mg/dL   Pro BNP (SLM) -STAT    Collection Time: 01/21/16  2:45 PM   Result Value Ref Range    Pro B-Type Natriuretic Peptide 5485 (H) 0 - 900 pg/mL   Hepatic Function Panel -STAT    Collection Time: 01/21/16  2:45 PM   Result Value Ref Range    Albumin 3.6 3.5 - 5.0 g/dL    Bilirubin Direct <1.6 0.0 - 0.3 mg/dL    Alkaline Phosphatase 87 35 - 105 U/L    AST - Aspartate Aminotransferase 29 5 - 32 U/L    ALT - Alanine Amino transferase 35 (H) 5 - 33 U/L     Protein Total 7.0 6.1 - 7.9 g/dL    Bilirubin Total 1.09 0.10 - 1.70 mg/dL   Troponin T -STAT    Collection Time: 01/21/16  2:45 PM   Result Value Ref Range    Troponin T 0.08 (HCrit) 0.00-<0.01 ng/mL   CK (Creatine Kinase) -STAT    Collection Time: 01/21/16  2:45 PM   Result Value Ref Range    CK (Creatine Kinase) 64 26 - 192 U/L   CKMB (SLM) -STAT    Collection Time: 01/21/16  2:45 PM   Result Value Ref Range    CKMB 2.4 0.6 - 6.3 ng/mL   Myoglobin -STAT    Collection Time: 01/21/16  2:45 PM   Result Value Ref Range    Myoglobin 50 25 - 58 ng/mL   CBC with Auto Differential -STAT    Collection Time: 01/21/16  2:45 PM   Result Value Ref Range    WBC 12.2 (H) 4.5 - 11.0 10*3/?L    RBC 4.26 3.80 - 5.60 10*6/?L    Hemoglobin 8.6 (L) 11.7 - 16.5 g/dL    HCT 60.4 (L) 54.0 - 48.0 %    MCV 68.0 (L) 81.0 - 100.0 fL    MCH 20.3 (L) 28.3 - 33.3 pg    MCHC 29.8 (L) 32.5 - 36.0 g/dL    RDW 98.1 (H) 19.1 - 16.1 %    Platelet Count 487 (H) 150 - 405 10*3/?L    MPV 8.4 6.9 - 10.0 fL   Procalcitonin -Next Routine    Collection Time: 01/21/16  2:45  PM   Result Value Ref Range    Procalcitonin <0.05 <=0.09 ng/mL   Cellavision Differential -Next Routine    Collection Time: 01/21/16  2:45 PM   Result Value Ref Range    WBC 12.2 (H) 4.5 - 11.0 10*3/?L    Neutrophils % 79 (H) 50 - 74 %    Lymphocytes % 15 (L) 20 - 40 %    Monocytes % 4 2 - 12 %    Eosinophils % 0 0 - 5 %    Basophils % 2 0 - 2 %    Neutrophils, Absolute 9.7 (H) 2.0 - 7.4 10*3/?L    Lymphocytes, Absolute 1.8 1.0 - 4.0 10*3/?L    Monocytes, Absolute 0.5 0.2 - 1.0 10*3/?L    Eosinophils, Absolute 0.0 0.0 - 0.4 10*3/?L    Basophils, Absolute 0.2 (H) 0.0 - 0.1 10*3/?L    Total Cells Counted 100     Platelet Estimate Increased (A) Adequate    Anisocytosis 2+     Poikilocytosis 1+     Polychromasia 1+     Hypochromasia 1+     Microcytes 2+     Ovalocytes 1+     nRBC 2 (H) 0 #/100 WBC   CRP, High-Sensitivity -ASAP    Collection Time: 01/21/16  2:45 PM   Result Value Ref Range       CRP (High Sensitivity) 8.780 (H) 0.000 - 0.748 mg/dL   Reticulocyte Count -ASAP    Collection Time: 01/21/16  2:45 PM   Result Value Ref Range    Reticulocyte Relative Percent 2.9 (H) 1.0 - 2.3 %    Reticulocyte Absolute Count 0.121 (H) 0.037 - 0.109 10*6/?L   Lactic Acid, Plasma -STAT    Collection Time: 01/21/16  3:11 PM   Result Value Ref Range    Lactic Acid 1.5 0.5 - 2.2 mmol/L   Prepare RBC -STAT    Collection Time: 01/21/16  6:25 PM   Result Value Ref Range    Blood Product Code [id number]     Blood Product Unit ID [id number]     Blood Product Unit ABO O     Blood Product Unit Rh POS     Blood Product Unit Crossmatch Compatible     Blood Product Status Issued     Blood Product Unit [facility]     Blood Product Unit Expiration [id number]     Blood Product Blood Type Barcode 5100     Blood Product Code Description E0336 AS-1 RBC LR     Blood Product Code Q0347Q25     Blood Product Unit ID [id number]     Blood Product Unit ABO O     Blood Product Unit Rh POS     Blood Product Unit Crossmatch Compatible     Blood Product Status Issued     Blood Product Unit [facility]     Blood Product Unit Expiration [id number]     Blood Product Blood Type Barcode 5100     Blood Product Code Description E0336 AS-1 RBC LR     Blood Product Code S0109N23     Blood Product Unit ID [id number]     Blood Product Unit ABO O     Blood Product Unit Rh POS     Blood Product Unit Crossmatch Compatible     Blood Product Status Unit Crossmatched - Ready to Transfuse     Blood Product Unit [facility]     Blood Product Unit Expiration [id number]  Blood Product Blood Type Barcode 5100     Blood Product Code Description E0336 AS-1 RBC LR     Blood Product Code Z6109U04     Blood Product Unit ID [id number]     Blood Product Unit ABO O     Blood Product Unit Rh POS     Blood Product Unit Crossmatch Compatible     Blood Product Status Unit Crossmatched - Ready to Transfuse     Blood Product Unit [facility]     Blood  Product Unit Expiration [id number]     Blood Product Blood Type Barcode 5100     Blood Product Code Description O9629 AS-1 RBC LR    Glyco-Hemoglobin A1C -ASAP    Collection Time: 01/21/16  7:00 PM   Result Value Ref Range    % GLYCOHEMOGLOBIN (A1C) 5.2 %   Magnesium -Daily    Collection Time: 01/21/16  7:00 PM   Result Value Ref Range    Magnesium 1.6 1.3 - 2.5 mg/dL   Ferritin -ASAP    Collection Time: 01/21/16  7:00 PM   Result Value Ref Range    Ferritin 19 13 - 150 ng/mL   Iron & Total Iron Binding Capacity -ASAP    Collection Time: 01/21/16  7:00 PM   Result Value Ref Range    Total Iron Binding Capacity (Transferrin Saturation) 463 261 - 487 ug/dL    Iron 10 (L) 28 - 528 ug/dL    Iron Saturation 2 (L) 20 - 55 %    Transferrin 324 192 - 382 mg/dL   ABO/Rh -Once    Collection Time: 01/21/16  7:03 PM   Result Value Ref Range    ABO O     Rh (D) Positive    Antibody Screen -Once    Collection Time: 01/21/16  7:03 PM   Result Value Ref Range    Antibody Screen Negative    Troponin T -Q6H    Collection Time: 01/21/16 10:14 PM   Result Value Ref Range    Troponin T 0.09 (HCrit) 0.00-<0.01 ng/mL   CKMB (SLM) -Q6H    Collection Time: 01/21/16 10:14 PM   Result Value Ref Range    CKMB 2.1 0.6 - 6.3 ng/mL   Basic Metabolic Panel -Daily    Collection Time: 01/22/16  4:44 AM   Result Value Ref Range    Sodium 140 135 - 145 mmol/L    Potassium 3.48 (L) 3.50 - 5.10 mmol/L    Chloride 103.1 101.0 - 111.0 mmol/L    CO2 - Carbon Dioxide 18 (L) 22 - 32 mmol/L    Creatinine 0.75 0.60 - 1.30 mg/dL    BUN 19 8 - 20 mg/dL    BUN / Creatinine Ratio 25.3 (H) 12.0 - 20.0    Glucose 107 (H) 74 - 106 mg/dL    Calcium 9.9 8.6 - 41.3 mg/dL    Anion Gap 24.4 (H) 01.0 - 20.0 mmol/L    Glomerular Filtration Rate Estimate >60.0 >=60.0 mL/min/1.66m*2    GFR Additional Info      Osmolality Calculation 282.0 275.0 - 300.0 mOsm/kg   CBC Without Differential -Daily    Collection Time: 01/22/16  4:44 AM   Result Value Ref Range    WBC 11.5 (H)  4.5 - 11.0 10*3/?L    RBC 4.53 3.80 - 5.60 10*6/?L    Hemoglobin 10.1 (L) 11.7 - 16.5 g/dL    HCT 27.2 (L) 53.6 - 48.0 %    MCV 71.0 (  L) 81.0 - 100.0 fL    MCH 22.3 (L) 28.3 - 33.3 pg    MCHC 31.4 (L) 32.5 - 36.0 g/dL    RDW 95.6 (H) 21.3 - 16.1 %    Platelet Count 427 (H) 150 - 405 10*3/?L    MPV 8.3 6.9 - 10.0 fL   Lipid Panel [facility]) -AM Draw    Collection Time: 01/22/16  4:44 AM   Result Value Ref Range    Triglyceride 79.0 <=150.0 mg/dL    Cholesterol 87 <=086 mg/dL    Cholesterol, HDL 19 (L) >=60 mg/dL    LDL Calculated 52 0 - 129 mg/dL    Chol/HDL Ratio 4.6 0.0 - 5.0    VLDL Cholesterol 15.8 7.0 - 32.0 mg/dL   Protime Panel -AM Draw    Collection Time: 01/22/16  4:44 AM   Result Value Ref Range    Protime 17.1 (H) 10.0 - 13.2 sec    INR 1.6 (H) 0.9 - 1.1 INR   Thyroid Stimulating Hormone -AM Draw    Collection Time: 01/22/16  4:44 AM   Result Value Ref Range    TSH - Thyroid Stimulating Hormone 2.09 0.34 - 5.60 uIU/mL   Magnesium -Daily    Collection Time: 01/22/16  4:44 AM   Result Value Ref Range    Magnesium 1.6 1.3 - 2.5 mg/dL   Troponin T -V7Q    Collection Time: 01/22/16  4:44 AM   Result Value Ref Range    Troponin T 0.08 (HCrit) 0.00-<0.01 ng/mL   CKMB (SLM) -Q6H    Collection Time: 01/22/16  4:44 AM   Result Value Ref Range    CKMB 2.4 0.6 - 6.3 ng/mL   Procalcitonin -AM Draw    Collection Time: 01/22/16  4:44 AM   Result Value Ref Range    Procalcitonin <0.05 <=0.09 ng/mL   Troponin T -Q6H    Collection Time: 01/22/16 10:50 AM   Result Value Ref Range    Troponin T 0.06 (HCrit) 0.00-<0.01 ng/mL   CKMB (SLM) -Q6H    Collection Time: 01/22/16 10:50 AM   Result Value Ref Range    CKMB 2.3 0.6 - 6.3 ng/mL     Pending Labs     Order Current Status    POC Chem 8 -Next Routine Collected (01/21/16 1439)    POC Troponin I -Next Routine Collected (01/21/16 1439)    Blood Culture -x 2 In process    Blood Culture -x 2 In process        Recent Imaging:  X-ray Chest Pa Or Ap    Result Date:  01/21/2016  PROCEDURE: XR CHEST PA OR AP  COMPARISON: None.  INDICATIONS: shortness of breath  FINDINGS:   LINES/TUBES: None.  HEART SIZE: Mild cardiac enlargement.  MEDIASTINUM: Normal.  LUNGS/PLEURA: Scattered reticular opacities are present in a perihilar distribution with some indistinctness of the vasculature: Findings of likely mild edema.  No definite pleural abnormality.  OTHER: None.  CONCLUSION: See above.             Report Dictated and Electronically Signed ByJeanice Lim on 01/21/2016 at 17:13          Ct Angiogram Pulmonary    Result Date: 01/21/2016  PROCEDURE:  CT ANGIOGRAM PULMONARY  CONTRAST NONIONIC CONTRAST  COMPARISON: None.  INDICATIONS: dyspnea / chest pain evaluate pe  TECHNIQUE: 2mm CT images were obtained of the chest with narrow collimation after the injection of IV nonionic contrast timed per protocol for  maximum opacification of the pulmonary arteries.  Coronal MIP reformatted images were obtained. Automated exposure control based on patient size was utilized as a dose reduction technique.  FINDINGS: The lack of a proper patient breath hold and the patient's right arm being in the downward position resulting in spray artifact somewhat limits the evaluation of fine detail.  PULMONARY ARTERIES: No large main branch or segmental pulmonary embolism is seen.  LUNGS: There are small bilateral pleural effusions, slightly larger on the right. There is diffuse geographic ground-glass attenuation seen throughout both lungs along with associated mild interlobular septal thickening. These findings are nonspecific but most likely reflective of pulmonary edema given cardiomegaly and the pleural effusions. There are a few areas of patchy opacity seen scattered throughout both lungs which are likely reflective of atelectasis and/or scarring.  THORACIC AORTA: No aneurysm. Minimal atherosclerotic aortic calcification. HEART / PERICARDIUM: Moderate cardiomegaly. No significant pericardial effusion.   MEDIASTINUM/HILA: There are multiple mildly prominent mediastinal and bilateral hilar lymph nodes which may be reactive in etiology although other etiologies cannot completely be excluded. A follow-up CT scan of the chest is recommended in 3-4 months to assess for the resolution of the lymphadenopathy.  CHEST WALL: Normal. LIMITED ABDOMEN: There is a small hypodense lesion seen about the falciform ligament of the liver which is too small to characterize. A tiny calcified granuloma is noted within the spleen. OTHER: There is moderate rightward convexity scoliosis of the thoracic spine along with moderate multilevel degenerative change.  CONCLUSION: See above.             Report Dictated and Electronically Signed By:  Francella Solian on 01/21/2016 at 16:15            Active Hospital Problems    Diagnosis SNOMED CT(R) Date Noted   . Aortic stenosis, severe AORTIC VALVE STENOSIS 01/22/2016   . Acute systolic heart failure (CMS/HCC) ACUTE SYSTOLIC HEART FAILURE 01/22/2016   . Heart failure (CMS/HCC) HEART FAILURE 01/21/2016   . Elevated brain natriuretic peptide (BNP) level HORMONE LEVEL - FINDING 01/21/2016   . NSTEMI (non-ST elevated myocardial infarction) (CMS/HCC) ACUTE NON-ST SEGMENT ELEVATION MYOCARDIAL INFARCTION 01/21/2016   . Demand ischemia of myocardium (CMS/HCC) ACUTE ISCHEMIC HEART DISEASE 01/21/2016   . Tachycardia TACHYCARDIA 01/21/2016   . Pleural effusion PLEURAL EFFUSION 01/21/2016   . Obesity OBESITY 01/21/2016   . Acute respiratory failure with hypoxia (CMS/HCC) ACUTE RESPIRATORY FAILURE 01/21/2016   . Hypotension LOW BLOOD PRESSURE 01/21/2016   . Iron deficiency anemia IRON DEFICIENCY ANEMIA 01/21/2016   . Thrombocytosis (CMS/HCC) THROMBOCYTOSIS 01/21/2016   . Weight loss WEIGHT DECREASED 01/21/2016   . Family history of cancer FAMILY HISTORY OF CANCER 01/21/2016   . Cardiomegaly CARDIOMEGALY 01/21/2016   . Mediastinal lymphadenopathy MEDIASTINAL LYMPHADENOPATHY 01/21/2016   . Hilar lymphadenopathy-  bilataeral  HILAR LYMPHADENOPATHY 01/21/2016   . Chest pain CHEST PAIN 01/21/2016   . Dyspnea and respiratory abnormality DYSPNEA 01/21/2016   . Elevated troponin HIGH TROPONIN I LEVEL 01/21/2016   . Abnormal EKG ELECTROCARDIOGRAM ABNORMAL 01/21/2016   . Opacity of lung on imaging study ABNORMAL FINDINGS ON DIAGNOSTIC IMAGING OF LUNG 01/21/2016   . Liver lesion LESION OF LIVER 01/21/2016       Assessment & Plan:     Pt is a 60 yr old female with no significant PMH presents with:  ?  1) acute hypoxic resp failure likely sec to new  heart failure exacerbation. Improving.   This going on since June. Suspect some sort of  myocarditis - ischemic/ viral (given started with resp symptoms).   - we will obtain stat echo to see EF, WMA, valvular lesions etc.   - Dr.Kucinsky to see the pt today  - 2 PRBC given, Hb rise appropriately and is now >10.   - digoxin given yday, will let cardiology to decide if needs to be continued.   - based on echo further management will be done - BB, ACEI etc.   ?  2) new onset CHF with NSTEMI : etiology to be determined. Cardiology consulted.    - continue ASA, statin, O2 for now.   -  She would need a cardiac cath to r/o CAD as cause of her CHF at some point. Will let Dr.Kucinsky decide on that.   ?  3) Microcytic anemia, likely sec to IDA: pt never had age appropriate cancer screening in her life, has +fh of cancers and says she lost 50lb wt loss.   - iron studies show severe IDA. s/p 2PRBC, start on iron supplementation.   - she will need colonoscopy and if that's neg an EGD. RN to arrange it with Dr.Mirande.  [facility] referral for a pap smear, mammogram etc. Call clinic on Monday and get it scheduled. RN aware.  - CTA shows some mediastinal LNpathy, follow up CTs to check resolution. Will need PCP for this.   ?  4) Wt loss: age appropriate cancer screening needed.  she will need colonoscopy and if that's neg an EGD. Also a pap smear, mammogram.   - CTA shows some mediastinal  LNpathy, follow up CTs to check resolution.   - needs PCP. RM consulted.   ?  5) leucocytosis with thrombocytosis: WBC elevation could be from stress, vs inflammatory process. Doubt has  CAP . Will do resp culture,  Dc IV Rocephin and azithromax, MRSA pcr.   - RTDP  - check u/a.  - thrombocytosis sec to IDA, stress.   ?  6) morbid obesity: wt loss recommended, diet and exercise recommended.   ?  7) b/l hilar and mediastinal LNpathy: outpt follow up with CT as recommended.     8) hypokalemia, mild: replacing daily as will be needing lasix.   ?  DVT /gi Prophylaxis. Heparin and PPI.   ?  RM, PT/OT consulted.   ?  DNR/DNI  ?  Dispo:  pt to remain inpt over weekend for more diuresis till is euvolumic, echo, med adjustment. Dc likely Monday -Tuesday.     Eldridge Abrahams, MD  Hospitalist  01/22/16  1:20 PM

## 2016-01-23 ENCOUNTER — Inpatient Hospital Stay: Admit: 2016-01-23

## 2016-01-23 ENCOUNTER — Inpatient Hospital Stay: Admit: 2016-01-24 | Payer: MEDICAID

## 2016-01-23 ENCOUNTER — Inpatient Hospital Stay
Admit: 2016-01-23 | Discharge: 2016-02-17 | Disposition: A | Discharge status: Rehabilitation Facility | Payer: MEDICAID | Source: Other Acute Inpatient Hospital | Attending: MD | Admitting: MD

## 2016-01-23 DIAGNOSIS — I350 Nonrheumatic aortic (valve) stenosis: Principal | ICD-10-CM

## 2016-01-23 LAB — BASIC METABOLIC PANEL
Anion Gap: 20 mmol/L (ref 12.0–20.0)
Anion Gap: 21 mmol/L — ABNORMAL HIGH (ref 12.0–20.0)
BUN / Creatinine Ratio: 27.8 — ABNORMAL HIGH (ref 12.0–20.0)
BUN / Creatinine Ratio: 29.3 — ABNORMAL HIGH (ref 12.0–20.0)
BUN: 25 mg/dL — ABNORMAL HIGH (ref 8–20)
BUN: 24 mg/dL — ABNORMAL HIGH (ref 8–20)
CO2 - Carbon Dioxide: 20 mmol/L — ABNORMAL LOW (ref 22–32)
CO2 - Carbon Dioxide: 20 mmol/L — ABNORMAL LOW (ref 22–32)
Calcium: 10 mg/dL (ref 8.6–10.6)
Calcium: 10 mg/dL (ref 8.6–10.6)
Chloride: 103.3 mmol/L (ref 101.0–111.0)
Chloride: 104.1 mmol/L (ref 101.0–111.0)
Creatinine: 0.9 mg/dL (ref 0.60–1.30)
Creatinine: 0.82 mg/dL (ref 0.60–1.30)
Glomerular Filtration Rate Estimate: >60 mL/min/{1.73_m2} (ref >=60.0)
Glomerular Filtration Rate Estimate: >60 mL/min/{1.73_m2} (ref >=60.0)
Glucose: 105 mg/dL (ref 74–106)
Glucose: 85 mg/dL (ref 74–106)
Osmolality Calculation: 282 mOsm/kg (ref 275.0–300.0)
Osmolality Calculation: 285 mOsm/kg (ref 275.0–300.0)
Potassium: 4.03 mmol/L (ref 3.50–5.10)
Potassium: 3.69 mmol/L (ref 3.50–5.10)
Sodium: 139 mmol/L (ref 135–145)
Sodium: 141 mmol/L (ref 135–145)

## 2016-01-23 LAB — URINALYSIS, MICROSCOPIC W/ REFLEX
Bacteria, Urine: NONE SEEN /HPF
Hyaline Casts, Urine: 6 /LPF — AB (ref 0–2)
RBC, Urine: 1 /HPF (ref 0–4)
Renal/Transitional Epithelial, Urine: 0 /HPF (ref 0–2)
Squamous Epithelial, UA: 1 /HPF (ref 0–4)
WBC, Urine: 1 /HPF (ref 0–4)

## 2016-01-23 LAB — CBC WITHOUT DIFFERENTIAL
HCT: 33.1 % — ABNORMAL LOW (ref 35.0–48.0)
HCT: 33.7 % — ABNORMAL LOW (ref 35.0–48.0)
Hemoglobin: 9.9 g/dL — ABNORMAL LOW (ref 11.7–16.5)
Hemoglobin: 10.5 g/dL — ABNORMAL LOW (ref 11.7–16.5)
MCH: 21.5 pg — ABNORMAL LOW (ref 28.3–33.3)
MCH: 22.2 pg — ABNORMAL LOW (ref 28.3–33.3)
MCHC: 30 g/dL — ABNORMAL LOW (ref 32.5–36.0)
MCHC: 31.2 g/dL — ABNORMAL LOW (ref 32.5–36.0)
MCV: 71.9 fL — ABNORMAL LOW (ref 81.0–100.0)
MCV: 71.2 fL — ABNORMAL LOW (ref 81.0–100.0)
MPV: 7.9 fL (ref 6.9–10.0)
MPV: 8.3 fL (ref 6.9–10.0)
Platelet Count: 432 10*3/ÂµL — ABNORMAL HIGH (ref 150–405)
Platelet Count: 437 10*3/ÂµL — ABNORMAL HIGH (ref 150–405)
RBC: 4.6 10*6/ÂµL (ref 3.80–5.60)
RBC: 4.73 10*6/ÂµL (ref 3.80–5.60)
RDW: 19.9 % — ABNORMAL HIGH (ref 11.7–16.1)
RDW: 19.7 % — ABNORMAL HIGH (ref 11.7–16.1)
WBC: 12.6 10*3/ÂµL — ABNORMAL HIGH (ref 4.5–11.0)
WBC: 12.2 10*3/ÂµL — ABNORMAL HIGH (ref 4.5–11.0)

## 2016-01-23 LAB — OBSOLETE INFLUENZA A AND B, MOLECULAR (SLM): Influenza B: NEGATIVE

## 2016-01-23 LAB — URINALYSIS, MACROSCOPIC W/ REFLEX
Bilirubin, Urine: NEGATIVE
Glucose, Urine: NEGATIVE mg/dL
Leukocyte Esterase, Urine: NEGATIVE
Nitrite, Urine: NEGATIVE
Protein, Urine: NEGATIVE mg/dL
Specific Gravity, Urine: 1.048 — ABNORMAL HIGH (ref 1.005–1.030)
Urobilinogen, Urine: <2 mg/dL (ref 0.0–2.0)
pH, UA: 5 (ref 5.0–7.5)

## 2016-01-23 LAB — PROTIME-INR
INR: 1.6 {INR} — ABNORMAL HIGH (ref 0.9–1.1)
Protime: 18 s — ABNORMAL HIGH (ref 10.0–13.2)

## 2016-01-23 LAB — PROCALCITONIN: Procalcitonin: <0.05 ng/mL (ref <=0.09)

## 2016-01-23 LAB — CATH LAB O2 SATURATIONS
BG CATH LAB HB: 9.9 g/dL
BG CATH LAB O2HB LV: 94.9 %
BG CATH LAB O2HB PA: 48.2 %

## 2016-01-23 LAB — APTT: APTT: 29 s (ref 24–36)

## 2016-01-23 LAB — INFLUENZA A AND B, MOLECULAR (SLM): Influenza A: NEGATIVE

## 2016-01-23 LAB — MRSA BY PCR: MRSA DNA Result: NEGATIVE

## 2016-01-23 LAB — MAGNESIUM: Magnesium: 1.5 mg/dL (ref 1.3–2.5)

## 2016-01-23 MED ORDER — sodium chloride (NS) 0.9% bolus 250 mL
INTRAVENOUS | Status: DC | PRN
Start: 2016-01-23 — End: 2016-01-23

## 2016-01-23 MED ORDER — sodium chloride 0.9 % (NS) syringe 5 mL
INTRAMUSCULAR | Status: DC | PRN
Start: 2016-01-23 — End: 2016-01-23

## 2016-01-23 MED ORDER — atropine syringe 0.5 mg
0.1 | INTRAMUSCULAR | Status: DC | PRN
Start: 2016-01-23 — End: 2016-01-23

## 2016-01-23 MED ORDER — heparin, porcine (PF) syringe 5,000 Units
5000 | Freq: Two times a day (BID) | INTRAMUSCULAR | Status: DC
Start: 2016-01-23 — End: 2016-01-27
  Administered 2016-01-24 – 2016-01-26 (×6): via SUBCUTANEOUS

## 2016-01-23 MED ORDER — diphenhydrAMINE (BENADRYL) capsule 25 mg
25 | Freq: Four times a day (QID) | ORAL | Status: DC | PRN
Start: 2016-01-23 — End: 2016-01-23
  Administered 2016-01-23: 15:00:00 25 mg via ORAL

## 2016-01-23 MED ORDER — sodium chloride 0.9 %
INTRAVENOUS | Status: DC
Start: 2016-01-23 — End: 2016-01-23

## 2016-01-23 MED ORDER — furosemide (LASIX) injection 60 mg
10 | Freq: Once | INTRAMUSCULAR | Status: AC
Start: 2016-01-23 — End: 2016-01-23
  Administered 2016-01-24: 01:00:00 10 mg via INTRAVENOUS

## 2016-01-23 MED ORDER — acetaminophen (TYLENOL) suppository 650 mg
650 | RECTAL | Status: DC | PRN
Start: 2016-01-23 — End: 2016-01-27

## 2016-01-23 MED ORDER — morphine injection 2 mg
2 | INTRAVENOUS | Status: DC | PRN
Start: 2016-01-23 — End: 2016-01-27

## 2016-01-23 MED ORDER — diazePAM (VALIUM) tablet 5 mg
5 | Freq: Once | ORAL | Status: DC
Start: 2016-01-23 — End: 2016-01-23

## 2016-01-23 MED ORDER — temazepam (RESTORIL) capsule 15 mg
15 | Freq: Every evening | ORAL | Status: DC | PRN
Start: 2016-01-23 — End: 2016-01-27
  Administered 2016-01-27: 07:00:00 15 mg via ORAL

## 2016-01-23 MED ORDER — acetaminophen (TYLENOL) tablet 650 mg
325 | ORAL | Status: DC | PRN
Start: 2016-01-23 — End: 2016-01-27

## 2016-01-23 MED ORDER — diphenhydrAMINE (BENADRYL) injection 12.5 mg
50 | Freq: Four times a day (QID) | INTRAMUSCULAR | Status: DC | PRN
Start: 2016-01-23 — End: 2016-01-23

## 2016-01-23 MED ORDER — sodium chloride 0.9 % (NS) syringe 5 mL
Freq: Three times a day (TID) | INTRAMUSCULAR | Status: DC
Start: 2016-01-23 — End: 2016-01-23
  Administered 2016-01-23 (×2): via INTRAVENOUS

## 2016-01-23 MED ORDER — famotidine (PF) (PEPCID) 20 mg in 0.9% NaCl IVPB Premix
20 | Freq: Two times a day (BID) | INTRAVENOUS | Status: DC
Start: 2016-01-23 — End: 2016-01-23

## 2016-01-23 MED ORDER — diazePAM (VALIUM) tablet 5 mg
5 | Freq: Once | ORAL | Status: DC | PRN
Start: 2016-01-23 — End: 2016-01-23
  Administered 2016-01-23: 15:00:00 5 mg via ORAL

## 2016-01-23 MED ORDER — ondansetron (ZOFRAN) injection 4 mg
4 | Freq: Four times a day (QID) | INTRAMUSCULAR | Status: DC | PRN
Start: 2016-01-23 — End: 2016-01-27

## 2016-01-23 MED ORDER — polyvinyl alcohol (LIQUIFILM TEARS) 1.4 % ophthalmic solution 1-2 drop
1.4 | OPHTHALMIC | Status: DC | PRN
Start: 2016-01-23 — End: 2016-01-27

## 2016-01-23 MED ORDER — magnesium sulfate 2 g in Water IVPB Premix
2 | Freq: Once | INTRAVENOUS | Status: AC
Start: 2016-01-23 — End: 2016-01-23
  Administered 2016-01-23: 20:00:00 2504 g via INTRAVENOUS
  Administered 2016-01-23: 18:00:00 2 g via INTRAVENOUS

## 2016-01-23 MED ORDER — aspirin 81 MG chewable tablet 81 mg
81 | Freq: Every day | ORAL | Status: DC
Start: 2016-01-23 — End: 2016-01-23
  Administered 2016-01-23: 18:00:00 81 mg via ORAL

## 2016-01-23 MED ORDER — barium (READI-CAT 2) 2 % oral suspension 900 mL
2.1 | Freq: Once | ORAL | Status: AC
Start: 2016-01-23 — End: 2016-01-24
  Administered 2016-01-24: 13:00:00 2.1 mL via ORAL

## 2016-01-23 MED ORDER — chlorhexidine (PERIDEX) 0.12 % solution 15 mL
0.12 | Freq: Two times a day (BID) | Status: DC
Start: 2016-01-23 — End: 2016-01-23

## 2016-01-23 MED FILL — READI-CAT 2  2.1 % (W/V), 2.0 % (W/W) ORAL SUSPENSION: 2.1 % (w/v), 2.0 % (w/w) | ORAL | Qty: 900

## 2016-01-23 NOTE — Progress Notes (Addendum)
RESPIRATORY ASSESSMENT    SUBJECTIVE:    Pt denies pulmonary history but states she gets short of breath easily. She has never smoked. She understands she has a heart issue after Dr. Brooke Dare discussion.     OBJECTIVE:   Current Vital Signs:    Pre and post C/S: clear, diminished   HR:  106   RR:  28              Cough: dry npc   SpO2:   97% on Room air.   ABG Results: NA   Vent/Bipap Settings: NA   CXR: 8/11 HEART SIZE:           Mild cardiac enlargement.  ??  MEDIASTINUM:                Normal.  ??  LUNGS/PLEURA:               Scattered reticular opacities are present in a   perihilar distribution with some indistinctness of the vasculature:   Findings of likely mild edema.  No definite pleural abnormality.  ??  OTHER:                None.  ASSESSMENT:     Alert and oriented. Pt has not history of pulmonary disease and has never smoked. BS are clear/diminshed. Pt is working on [employer] which was initiated at referring hospital. 750 ml of predicted 2300. Pt is obese. No PFT noted at this time. Pt does get short of breath with very little exertion and increases RR to upper 20s.     PLAN:    Encourage IS. Monitor and re-evaluate as needed.     GOAL(S):    Pulmonary/Bronchial Hygiene:  Improve mucocilliary clearance  Prevent or correct atelectasis    Supplemental Oxygen:   Maintain SpO2 >  90 %  Normalize and maintain an adequate SpO2/PaO2 appropriate for patient clinical situation    Per PDP # 102 and MD orders    E.S.  Effie Berkshire, RRT

## 2016-01-23 NOTE — Progress Notes (Signed)
Patient transported to PCU via bed/cath lab staff in stable condition. 6&7 fr sheath removed from the right groin site. Angio-seal was used to close the arteriotomy and hem-con patch and  manual pressure was held for the vein. Dressing is CDI.  no hematoma present.  Report given to [facility].  Patient is sleepy and responds to all stimuli. The patient has no complaints of pain or discomfort and is in no distress. Please see cath lab log for medication or additional information.

## 2016-01-23 NOTE — Progress Notes (Addendum)
Daily Progress Note  [facility]   Name: Brandy [name]  DOB: [birth year] 60 y.o.  321/321-01  MRN: [id number]  CSN: [id number]  PCP:NO FAMILY PHYSICIAN,    Brandy [name] is a 60 y.o. female patient with no significant PMH is admitted to [name] ON 8/11 with Acute Hypoxic respiratory failure sec to HF exacerbation with NYHA Class 4 symptoms, IDA.Marland Kitchen Also had NSTEMI. She hasn't seen doctors in years, had 50lb wt loss in couple of months and never did age appropriate cancer screening. On admission pt had leucocytosis, Hb 8.6 (got 2 units PRBC), neg procal but CRP of 8.7, BNP 5484, Trops 0.08<0.09<0.09 with neg CPK-MB. CTA neg for PE but showed small b/l pleural effusions R>L, atelectasis and pulm edema, and multiple mildly prominent mediastinal and bilateral hilar lymph nodes. She says since June she has been having some cough with congestion and whitish mucoidal phlegm production. She was started on rocephin and azithromycin empirically to cover for CAP just in case which is discontinued next day given 2 neg procal. An echo was obtained which showed Severe left ventricular systolic dysfunction, estimated LVEF 35%, severe to critical aortic stenosis, calculated valve area 0.3 to 0.4 sq cm consistent with critical aortic stenosis,  severe pulmonary hypertension with estimated RVSP 52-62 mmHg and right pleural effusion. Dr.Kucinsky consulted and he will cath her 8/13 to study coronary anatomy and dynamics and then pt will be needed to transfer to [facility] for TAVR vs open heart. RM consulted as pt has cost concerns. Pt remain admitted for ongoing care.      Subjective:      pt feels fine. Feel less winded. Denies CP, palpitations. Does c/o cough with phlegm. Has been afebrile and procal neg again.   Going for cardiac cath today with Dr.Kucinsky.   - no over night events.     ROS    Neg except as above.     Objective:     Vitals Current 24 Hour Min / Max      Temp    36.8 ?C (98.3 ?F)    Temp  Min:  36.2 ?C (97.1 ?F)  Max: 36.9 ?C (98.5 ?F)      BP     104/73     BP  Min: 91/66  Max: 118/87      HR    100    Pulse  Min: 99  Max: 127      RR    20    Resp  Min: 16  Max: 28      Sats      SpO2: 93 % on  L/min       SpO2  Min: 91 %  Max: 96 %      Weight    102.3 kg (225 lb 9.6 oz)  Body mass index is 36.41 kg/(m^2).    Admit: 102.5 kg (226 lb)     Physical Exam    Vitals:    01/23/16 0627   BP: 104/73   Pulse: 100   Resp: 20   Temp: 36.8 ?C (98.3 ?F)   SpO2: 93%     Gen: no distress. Speaking in full sentences.   RS: bibasilar creps, R>L  Abd: soft, NT  CVS: s1,s2, esm  CNS: no deficits. AO#4.   MSK: 1+ pitting pedal edema b/l.     Scheduled Medications:  . atorvastatin  20 mg Oral Nightly   . carvedilol  3.125 mg Oral BID with  meals   . ferrous sulfate  325 mg Oral BID with meals   . furosemide  20 mg Intravenous 2 times per day   . guaiFENesin  600 mg Oral 2 times per day   . heparin  5,000 Units Subcutaneous Q8H SCH   . magnesium sulfate IVPB  2 g Intravenous Once   . pantoprazole  40 mg Oral QAM AC   . potassium chloride SA  40 mEq Oral Daily   . sodium chloride 0.9 % (NS) syringe  5 mL Intravenous Q8H SCH     Infusions:      PRN Medications: acetaminophen, albuterol, diazePAM, diphenhydrAMINE **OR** diphenhydrAMINE, HYDROcodone-acetaminophen, nitroglycerin, sodium chloride 0.9% (NS), ondansetron, sodium chloride 0.9 % (NS) syringe  Recent Labs:  Results for orders placed or performed during the hospital encounter of 01/21/16 (from the past 24 hour(s))   Troponin T -Q6H    Collection Time: 01/22/16 10:50 AM   Result Value Ref Range    Troponin T 0.06 (HCrit) 0.00-<0.01 ng/mL   CKMB (SLM) -Q6H    Collection Time: 01/22/16 10:50 AM   Result Value Ref Range    CKMB 2.3 0.6 - 6.3 ng/mL   Influenza A and B, Molecular -Next Routine    Collection Time: 01/22/16  8:00 PM   Result Value Ref Range    Influenza A Negative Negative    Influenza B Negative Negative   MRSA (Methicillin Resistant Staph aureus) by PCR -STAT      Collection Time: 01/22/16  8:00 PM   Result Value Ref Range    MRSA DNA Result Negative Negative   Basic Metabolic Panel -STAT    Collection Time: 01/23/16 12:18 AM   Result Value Ref Range    Sodium 139 135 - 145 mmol/L    Potassium 4.03 3.50 - 5.10 mmol/L    Chloride 103.3 101.0 - 111.0 mmol/L    CO2 - Carbon Dioxide 20 (L) 22 - 32 mmol/L    Creatinine 0.90 0.60 - 1.30 mg/dL    BUN 25 (H) 8 - 20 mg/dL    BUN / Creatinine Ratio 27.8 (H) 12.0 - 20.0    Glucose 105 74 - 106 mg/dL    Calcium 16.1 8.6 - 09.6 mg/dL    Anion Gap 04.5 40.9 - 20.0 mmol/L    Glomerular Filtration Rate Estimate >60.0 >=60.0 mL/min/1.21m*2    GFR Additional Info      Osmolality Calculation 282.0 275.0 - 300.0 mOsm/kg   CBC Without Differential -STAT    Collection Time: 01/23/16 12:18 AM   Result Value Ref Range    WBC 12.6 (H) 4.5 - 11.0 10*3/?L    RBC 4.60 3.80 - 5.60 10*6/?L    Hemoglobin 9.9 (L) 11.7 - 16.5 g/dL    HCT 81.1 (L) 91.4 - 48.0 %    MCV 71.9 (L) 81.0 - 100.0 fL    MCH 21.5 (L) 28.3 - 33.3 pg    MCHC 30.0 (L) 32.5 - 36.0 g/dL    RDW 78.2 (H) 95.6 - 16.1 %    Platelet Count 432 (H) 150 - 405 10*3/?L    MPV 7.9 6.9 - 10.0 fL   Protime Panel -STAT    Collection Time: 01/23/16 12:18 AM   Result Value Ref Range    Protime 18.0 (H) 10.0 - 13.2 sec    INR 1.6 (H) 0.9 - 1.1 INR   APTT -STAT    Collection Time: 01/23/16 12:18 AM   Result Value Ref Range  APTT 29 24 - 36 sec   Basic Metabolic Panel -Daily    Collection Time: 01/23/16  5:19 AM   Result Value Ref Range    Sodium 141 135 - 145 mmol/L    Potassium 3.69 3.50 - 5.10 mmol/L    Chloride 104.1 101.0 - 111.0 mmol/L    CO2 - Carbon Dioxide 20 (L) 22 - 32 mmol/L    Creatinine 0.82 0.60 - 1.30 mg/dL    BUN 24 (H) 8 - 20 mg/dL    BUN / Creatinine Ratio 29.3 (H) 12.0 - 20.0    Glucose 85 74 - 106 mg/dL    Calcium 16.1 8.6 - 09.6 mg/dL    Anion Gap 04.5 (H) 40.9 - 20.0 mmol/L    Glomerular Filtration Rate Estimate >60.0 >=60.0 mL/min/1.72m*2    GFR Additional Info      Osmolality  Calculation 285.0 275.0 - 300.0 mOsm/kg   CBC Without Differential -Daily    Collection Time: 01/23/16  5:19 AM   Result Value Ref Range    WBC 12.2 (H) 4.5 - 11.0 10*3/?L    RBC 4.73 3.80 - 5.60 10*6/?L    Hemoglobin 10.5 (L) 11.7 - 16.5 g/dL    HCT 81.1 (L) 91.4 - 48.0 %    MCV 71.2 (L) 81.0 - 100.0 fL    MCH 22.2 (L) 28.3 - 33.3 pg    MCHC 31.2 (L) 32.5 - 36.0 g/dL    RDW 78.2 (H) 95.6 - 16.1 %    Platelet Count 437 (H) 150 - 405 10*3/?L    MPV 8.3 6.9 - 10.0 fL   Magnesium -Daily    Collection Time: 01/23/16  5:19 AM   Result Value Ref Range    Magnesium 1.5 1.3 - 2.5 mg/dL   Procalcitonin -ASAP    Collection Time: 01/23/16  5:19 AM   Result Value Ref Range    Procalcitonin <0.05 <=0.09 ng/mL   Cath Lab O2 Saturations -Next Routine    Collection Time: 01/23/16  9:50 AM   Result Value Ref Range    BG CATH LAB HB 9.9 g/dL    BG CATH LAB O1HY LV 94.9 %    BG CATH LAB O2HB PA 48.2 %     Pending Labs     Order Current Status    POC Chem 8 -Next Routine Collected (01/21/16 1439)    POC Troponin I -Next Routine Collected (01/21/16 1439)    Blood Culture -x 2 In process    Blood Culture -x 2 In process        Recent Imaging:  X-ray Chest Pa Or Ap    Result Date: 01/21/2016  PROCEDURE: XR CHEST PA OR AP  COMPARISON: None.  INDICATIONS: shortness of breath  FINDINGS:   LINES/TUBES: None.  HEART SIZE: Mild cardiac enlargement.  MEDIASTINUM: Normal.  LUNGS/PLEURA: Scattered reticular opacities are present in a perihilar distribution with some indistinctness of the vasculature: Findings of likely mild edema.  No definite pleural abnormality.  OTHER: None.  CONCLUSION: See above.             Report Dictated and Electronically Signed ByJeanice Lim on 01/21/2016 at 17:13          Ct Angiogram Pulmonary    Result Date: 01/21/2016  PROCEDURE:  CT ANGIOGRAM PULMONARY  CONTRAST NONIONIC CONTRAST  COMPARISON: None.  INDICATIONS: dyspnea / chest pain evaluate pe  TECHNIQUE: 2mm CT images were obtained of the chest with narrow  collimation after the injection  of IV nonionic contrast timed per protocol for maximum opacification of the pulmonary arteries.  Coronal MIP reformatted images were obtained. Automated exposure control based on patient size was utilized as a dose reduction technique.  FINDINGS: The lack of a proper patient breath hold and the patient's right arm being in the downward position resulting in spray artifact somewhat limits the evaluation of fine detail.  PULMONARY ARTERIES: No large main branch or segmental pulmonary embolism is seen.  LUNGS: There are small bilateral pleural effusions, slightly larger on the right. There is diffuse geographic ground-glass attenuation seen throughout both lungs along with associated mild interlobular septal thickening. These findings are nonspecific but most likely reflective of pulmonary edema given cardiomegaly and the pleural effusions. There are a few areas of patchy opacity seen scattered throughout both lungs which are likely reflective of atelectasis and/or scarring.  THORACIC AORTA: No aneurysm. Minimal atherosclerotic aortic calcification. HEART / PERICARDIUM: Moderate cardiomegaly. No significant pericardial effusion.  MEDIASTINUM/HILA: There are multiple mildly prominent mediastinal and bilateral hilar lymph nodes which may be reactive in etiology although other etiologies cannot completely be excluded. A follow-up CT scan of the chest is recommended in 3-4 months to assess for the resolution of the lymphadenopathy.  CHEST WALL: Normal. LIMITED ABDOMEN: There is a small hypodense lesion seen about the falciform ligament of the liver which is too small to characterize. A tiny calcified granuloma is noted within the spleen. OTHER: There is moderate rightward convexity scoliosis of the thoracic spine along with moderate multilevel degenerative change.  CONCLUSION: See above.             Report Dictated and Electronically Signed By:  Francella Solian on 01/21/2016 at 16:15               Ultrasound Carotid Bilateral Duplex    Result Date: 01/22/2016  PROCEDURE: US CAROTID BILATERAL DUPLEX  COMPARISON: None.  INDICATIONS: aortic stenosis, pre surgery  TECHNIQUE: A GE scanner utilizing B-Mode, pulsed Doppler and color flow imaging was used to insonate and visualize both right and left extracranial carotid arteries.  Blood flow velocities and directions were recorded.   PEAK FLOW VELOCITIES (cm/second):  Right Common Carotid Artery (RCCA): 55.47 cm/s  Right Internal Carotid Artery (RICA):  Proximal: 39.77 cm/s  Mid: 85.04 cm/s  Distal: -75.34 cm/s  Right External Carotid Artery (RECA): 122.23 cm/s   Left Common Carotid Artery (LCCA): 88.43 cm/s  Left Internal Carotid Artery (RICA):  Proximal: 77.04 cm/s  Mid: -65.66 cm/s  Left External Carotid Artery (LECA): -44.82 cm/s  IMAGING FINDINGS: RIGHT CAROTID: ICA/CCA RATIO:.67  BP:  Does not appear to be any hemodynamically significant stenosis LEFT CAROTID: ICA/CCA RATIO:.88  BP:  Does not appear to be any hemodynamically significant stenosis VERTEBRALS: Antegrade flow bilaterally.    CONCLUSION:   1. RIGHT internal carotid artery: No significant stenosis.  2. LEFT internal carotid artery: No significant stenosis.  Grading of stenosis based on Radiologists in Ultrasound Consensus Criteria.  Peak systolic velocities of internal carotid arteries Normal: No plaque or intimal thickening <125 cm/sec = 0 - 49% diameter stenosis with plaque or intimal thickening 125 - 250 cm/sec = 50 - 69% and visible plaque 250 - 325 cm/sec = 70 - 90% >325 cm/sec = > 90% Near Occlusion: Marked narrowing on color doppler, velocity criteria may not apply Occlusion: No flow is seen with color/power doppler.  Ratio of peak systolic velocities Internal CA/Common CA 2.0-3.9 = 50 - 69% stenosis >4.0 = 70 - 99%  stenosis            Report Dictated and Electronically Signed ByJeanice Lim on 01/22/2016 at 18:01          Echo 2d Complete Doppler Color Flow    Result Date:  01/22/2016  A. PATIENT INFORMATION  DATE OF STUDY: 01/22/2016 ORD DOCTOR: Eldridge Abrahams, MD. AGE: 46  HT. 168 cm  WT. 103 kg  BSA: 2.11 sq m  HR: 121 CLINICAL DIAGNOSIS: Acute on chronic systolic heart failure, systolic murmur.  B. M-MODE MEASUREMENTS LT Ventricle Diastole: 59(NL<54mm,33mm/sq m) RT Ventricle: 41(NL<48mm,21mm/sq m)                Systole: 49    Aortic Root: 30  (NL<31mm,<22mm/sq m) % Fractional Shortening: 17 (NL>25%)  Left Atrium: 46  (NL<54mm,<24mm/sq m) Diastolic Wall Thickness Septum: 17 (NL<3mm) Posterior wall: 14  (NL<69mm) Ascending aorta:  38  C. 2-D MEASUREMENTS Calculated ejection fraction: 27%           Pericardial effusion:  INTERPRETATION: 1.  Patient has been in sinus rhythm through evaluation. 2.  Left ventricular size is moderately enlarged.  Global left ventricular systolic function is severely decreased.  There is global left ventricular hypokinesis noted.  Estimated left ventricular ejection fraction is 30%.  There is moderate to severe concentric left ventricular hypertrophy noted.  Also, flattening of the interventricular septum during systole and diastole suggesting pressure volume right ventricular overload. 3.  Left atrium is moderately dilated.  Right atrium is moderately dilated.  Right ventricle is moderately dilated with right ventricular hypokinesis noted. 4.  Aortic valve is severely thickened and calcified.  There is increased flow velocity through apical views 4.4 m/sec, peak instantaneous gradient is 76 mmHg, mean gradient 14 mmHg, calculated aortic valve area 0.3 to 0.4 sq cm consistent with severe to  critical aortic stenosis.  Mitral valve is mildly thickened.  There is mild to moderate mitral regurgitation. 5.  Tricuspid valve is mildly thickened.  There is moderate tricuspid regurgitation, estimated RVSP 52-62 mmHg in the range of moderate to severe pulmonary hypertension. 6.  Doppler evaluation of mitral inflow tract showed markedly increased E:A ratio suggesting  severe diastolic dysfunction. 7.  No significant pericardial effusion. 8.  Inferior vena cava is dilated, collapsing with inspiration, suggesting elevated right atrial pressure. 9.  Ascending aorta appears to be moderately dilated. CONCLUSION:  Severe left ventricular systolic dysfunction, estimated LVEF 35%.  There is severe to critical aortic stenosis, calculated valve area 0.3 to 0.4 sq cm consistent with critical aortic stenosis.  There is also severe pulmonary hypertension with estimated RVSP 52-62 mmHg.  There is right pleural effusion noted as well.  KUCRA:BECKDA                  Ian Malkin, MD D:  01/22/2016 12:59:24 T:  01/22/2016 13:24:06     V/D:  [id number]             E:  /                                 Active Hospital Problems    Diagnosis SNOMED CT(R) Date Noted   . Aortic stenosis, severe AORTIC VALVE STENOSIS 01/22/2016   . Acute systolic heart failure (CMS/HCC) ACUTE SYSTOLIC HEART FAILURE 01/22/2016   . Heart failure (CMS/HCC) HEART FAILURE 01/21/2016   . Elevated brain natriuretic peptide (BNP) level HORMONE LEVEL - FINDING 01/21/2016   .  NSTEMI (non-ST elevated myocardial infarction) (CMS/HCC) ACUTE NON-ST SEGMENT ELEVATION MYOCARDIAL INFARCTION 01/21/2016   . Demand ischemia of myocardium (CMS/HCC) ACUTE ISCHEMIC HEART DISEASE 01/21/2016   . Tachycardia TACHYCARDIA 01/21/2016   . Pleural effusion PLEURAL EFFUSION 01/21/2016   . Obesity OBESITY 01/21/2016   . Acute respiratory failure with hypoxia (CMS/HCC) ACUTE RESPIRATORY FAILURE 01/21/2016   . Hypotension LOW BLOOD PRESSURE 01/21/2016   . Iron deficiency anemia IRON DEFICIENCY ANEMIA 01/21/2016   . Thrombocytosis (CMS/HCC) THROMBOCYTOSIS 01/21/2016   . Weight loss WEIGHT DECREASED 01/21/2016   . Family history of cancer FAMILY HISTORY OF CANCER 01/21/2016   . Cardiomegaly CARDIOMEGALY 01/21/2016   . Mediastinal lymphadenopathy MEDIASTINAL LYMPHADENOPATHY 01/21/2016   . Hilar lymphadenopathy- bilataeral  HILAR LYMPHADENOPATHY  01/21/2016   . Chest pain CHEST PAIN 01/21/2016   . Dyspnea and respiratory abnormality DYSPNEA 01/21/2016   . Elevated troponin HIGH TROPONIN I LEVEL 01/21/2016   . Abnormal EKG ELECTROCARDIOGRAM ABNORMAL 01/21/2016   . Opacity of lung on imaging study ABNORMAL FINDINGS ON DIAGNOSTIC IMAGING OF LUNG 01/21/2016   . Liver lesion- small hypodense lesion around falciform lig LESION OF LIVER 01/21/2016       Assessment & Plan:     Pt is a 60 yr old female with no significant PMH presents with:  ?  1) acute hypoxic resp failure likely sec to new systolic heart failure exacerbation (EF 35%) and NSTEMI with echo showing critical AS with severe pulm HTN.  .   - Dr.Kucinsky to take her for cardiac cath today. Pt then will need to be transferred to [facility] for surgical replacement or possibly TAVR versus balloon valvuloplasty. Dr.Kucinsky to arrange that.    - 2 PRBC given, Hb rise appropriately and is now 10.5  - continue ASA, Coreg, lipitor, Lasix. If BP tolerated will need ACEI and spironolactone too.   - carotid duplex ordered by Dr.Kucinsky and shows no stenosis.   ?  2) Microcytic anemia, likely sec to IDA: pt never had age appropriate cancer screening in her life, has +fh of cancers and says she lost 50lb wt loss.   - iron studies show severe IDA. s/p 2PRBC, start on iron supplementation.   - she will need colonoscopy and if that's neg an EGD. RN to arrange it with Dr.Mirande. FOBT ordered.   [facility] referral for a pap smear, mammogram etc. Call clinic on Monday and get it scheduled. RN aware.  - CTA shows some mediastinal LNpathy, follow up CTs to check resolution. Will need PCP for this.   ?  3) Wt loss: age appropriate cancer screening needed.  she will need colonoscopy and if that's neg an EGD. Also a pap smear, mammogram.   - CTA shows some mediastinal LNpathy, follow up CTs to check resolution.   - needs PCP. RM consulted.   ?  4) leucocytosis with thrombocytosis: WBC elevation could be from stress, vs  inflammatory process. Doubt has  CAP . Procal neg #3. Will do resp culture,  Dc IV Rocephin and azithromax, MRSA pcr neg.   - RTDP  - check u/a.  - thrombocytosis sec to IDA, stress.   ?  5) hypomagnesemia: 2gms IV.     6) morbid obesity: wt loss recommended, diet and exercise recommended.   ?  7) b/l hilar and mediastinal LNpathy: outpt follow up with CT as recommended.     8) hypokalemia, mild: replacing daily as will be needing lasix.     9) Elevated INR: Pt  not on coumadin. Also has some mild transaminitis on admission. CTA chest showed a small hypodense lesion seen about the falciform ligament of the liver which is too small to characterize.  - USG abd to study hepatic morphology. CMP in am.   ?  DVT /gi Prophylaxis. Heparin and PPI.   ?  RM, PT/OT consulted.   ?  DNR/DNI  ?  Dispo:  pt to remain inpt over weekend for more diuresis till is euvolumic,  med adjustment. Needs to meet with RM on Monday to discuss cost concerns and then can be transferred to [facility] when arranged by Dr.Kucinsky.     Eldridge Abrahams, MD  Hospitalist  01/23/16  10:13 AM

## 2016-01-23 NOTE — Progress Notes (Signed)
EKG completed @ 01:01  Ermalene Postin  01/23/2016

## 2016-01-23 NOTE — Plan of Care (Signed)
Problem: Knowledge Deficit  Goal: Patient/family/caregiver demonstrates understanding of disease process, treatment plan, medications, and discharge instructions  Complete learning assessment and assess knowledge base.   Outcome: Progressing  Pt alert and oriented, participating in care, asking appropriate questions. CHF booklet given to pt during the day. Continue to educated as needed.    Problem: Hemodynamic Status  Goal: Patient's vitals signs are stable  Assess and monitor patient's heart rate, rhythm, respiratory rate, peripheral pulses, capillary refill, color, body temperature, intake and output, labs and physical activity tolerance. Observe for signs of chest pain (note location, duration, severity, radiation and associated symptoms such as diaphoresis, nausea, indigestion). Monitor for signs and symptoms of heart failure (eg. shortness of breath, edema of feet/ankles/legs, rapid irregular heart rate, coughing, wheezing, white/pink blood tinged sputum, sudden weight gain, chest pain). Collaborate with interdisciplinary team and initiate plan and interventions as ordered.   Outcome: Not Progressing  No c/o chest pain this shift, pt SR/ST, hr in the 90-110s. Pt sob with exertion, sleeping in chair this shift, pt to have heart cath in the am. Continue to monitor.     Problem: Excessive Fluid Volume  Goal: Fluid and electrolyte balance are achieved/maintained  Assess and monitor vitals signs (hemodynamic parameters such as CVP, MAP, PAP, PCWP, and CO if applicable), fluid intake and output, urine color, labs, respiratory status, edema, jugular venous distention, and mental status. Monitor for signs and symptoms of hypervolemia (strong rapid pulse, shortness of breath, difficulty breathing lying down, crackles heard in lung fields, edema). Collaborate with interdisciplinary team and initiate plan and interventions as ordered.   Outcome: Not Progressing  Pt alert and oriented this shift, no c/o pain, voiding  adequate amounts. Continue to monitor.    Problem: Activity Intolerance/Impaired Mobility  Goal: Patient will achieve/maintain normal respiratory rate/effort  Assess and monitor respiratory rate, effort, breathing pattern and oxygenation as ordered or per policy. Monitor patient for restlessness, anxiety, air hunger. Assess physical activity tolerance. Collaborate with interdisciplinary team and initiate plans and interventions as needed.   Outcome: Not Progressing  Pt sob with activity, sleeping in chair. 1 person assist with walker, maintaining sats on RA. Continue to be available for assistance.

## 2016-01-23 NOTE — Progress Notes (Signed)
Pt assisted to ambulate to restroom and sit in chair at 1400 post bedrest. Groin site remains unchanged and CDI. Report given to [facility]. Pt appeared in no acute distress and denied any needs or discomfort. Pt was assisted to gurney. Groin site remained CDI with no noted changes.

## 2016-01-23 NOTE — Progress Notes (Signed)
EKG done at 1056 and placed in pt's chart.

## 2016-01-23 NOTE — Progress Notes (Signed)
Per H&P, patient never smoked. Did not give patient tobacco cessation information.

## 2016-01-23 NOTE — Discharge Summary (Signed)
Physician TransferSummary     Patient ID:  Name: Rhina Kramme  DOB: [birth year].  MRN: [id number]  CSN: [id number]    Admit date: 01/21/2016    Discharge date: 01/23/16    Admission Diagnosis: Other chest pain [R07.89]  Pleural effusion [J90]  Abnormal EKG [R94.31]  Dyspnea and respiratory abnormality [R06.00, R06.89]  Elevated troponin [R74.8]  Liver lesion [K76.9]  Elevated brain natriuretic peptide (BNP) level [R79.89]  Opacity of lung on imaging study [R91.8]    Discharge Diagnoses:    Patient Active Problem List    Diagnosis SNOMED CT(R) Date Noted   . Critical aortic valve stenosis CRITICAL STENOSIS OF AORTIC VALVE 01/23/2016   . Non-ischemic cardiomyopathy (CMS/HCC) CARDIOMYOPATHY 01/23/2016   . Aortic stenosis, severe AORTIC VALVE STENOSIS 01/22/2016   . Acute systolic heart failure (CMS/HCC) ACUTE SYSTOLIC HEART FAILURE 01/22/2016   . Heart failure (CMS/HCC) HEART FAILURE 01/21/2016   . Elevated brain natriuretic peptide (BNP) level HORMONE LEVEL - FINDING 01/21/2016   . NSTEMI (non-ST elevated myocardial infarction) (CMS/HCC) ACUTE NON-ST SEGMENT ELEVATION MYOCARDIAL INFARCTION 01/21/2016   . Demand ischemia of myocardium (CMS/HCC) ACUTE ISCHEMIC HEART DISEASE 01/21/2016   . Tachycardia TACHYCARDIA 01/21/2016   . Pleural effusion PLEURAL EFFUSION 01/21/2016   . Obesity OBESITY 01/21/2016   . Acute respiratory failure with hypoxia (CMS/HCC) ACUTE RESPIRATORY FAILURE 01/21/2016   . Hypotension LOW BLOOD PRESSURE 01/21/2016   . Iron deficiency anemia IRON DEFICIENCY ANEMIA 01/21/2016   . Thrombocytosis (CMS/HCC) THROMBOCYTOSIS 01/21/2016   . Weight loss WEIGHT DECREASED 01/21/2016   . Family history of cancer FAMILY HISTORY OF CANCER 01/21/2016   . Cardiomegaly CARDIOMEGALY 01/21/2016   . Mediastinal lymphadenopathy MEDIASTINAL LYMPHADENOPATHY 01/21/2016   . Hilar lymphadenopathy- bilataeral  HILAR LYMPHADENOPATHY 01/21/2016   . Chest pain CHEST PAIN 01/21/2016   . Dyspnea and respiratory abnormality  DYSPNEA 01/21/2016   . Elevated troponin HIGH TROPONIN I LEVEL 01/21/2016   . Abnormal EKG ELECTROCARDIOGRAM ABNORMAL 01/21/2016   . Opacity of lung on imaging study ABNORMAL FINDINGS ON DIAGNOSTIC IMAGING OF LUNG 01/21/2016   . Liver lesion- small hypodense lesion around falciform lig LESION OF LIVER 01/21/2016       Hospital Course:     Zariana Strub is a 60 y.o. female patient with no significant PMH is admitted to [facility] ON 8/11 with Acute Hypoxic respiratory failure sec to HF exacerbation with NYHA Class 4 symptoms, IDA.Marland Kitchen Also had NSTEMI. She hasn't seen doctors in years, had 50lb wt loss in couple of months and never did age appropriate cancer screening. On admission pt had leucocytosis, Hb 8.6 (got 2 units PRBC), neg procal but CRP of 8.7, BNP 5484, Trops 0.08<0.09<0.09 with neg CPK-MB. CTA neg for PE but showed small b/l pleural effusions R>L, atelectasis and pulm edema, and multiple mildly prominent mediastinal and bilateral hilar lymph nodes. She says since June she has been having some cough with congestion and whitish mucoidal phlegm production. She was started on rocephin and azithromycin empirically to cover for CAP just in case which is discontinued next day given 2 neg procal. An echo was obtained which showed Severe left ventricular systolic dysfunction, estimated LVEF 35%, severe to critical aortic stenosis, calculated valve area 0.3 to 0.4 sq cm consistent with critical aortic stenosis,  severe pulmonary hypertension with estimated RVSP 52-62 mmHg and right pleural effusion. Dr.Kucinsky consulted and he  Did cardiac cath her 8/13 to study coronary anatomy and dynamics. Study showed clean coronaries but with systolic dysfunction with EF  30-35% (please read cath report for accurate findings). Case was discussed with CT surgery at [facility] by Dr.Kucinsky and  pt will be transferred to [facility] today for aortic valve repair or replacement surgery.     1) acute hypoxic resp failure likely sec to new  systolic heart failure exacerbation (EF 35%) and NSTEMI with echo showing critical AS with severe pulm HTN.  .   - Dr.Kucinsky did cath and is was clean . transferred to [name] today for aortic valve repair or replacement surgery.    - 2 PRBC given, Hb rise appropriately and is now 10.5  - continue ASA, Coreg, lipitor, Lasix. If BP tolerated will need ACEI and spironolactone too.   - carotid duplex ordered by Dr.Kucinsky and shows no stenosis.   ??  2) Microcytic anemia, likely sec to IDA:?pt never had age appropriate cancer screening in her life, has +fh of cancers and says she lost 50lb wt loss.   - iron studies show severe IDA. s/p 2PRBC, start on iron supplementation.   - she will need colonoscopy and if that's neg an EGD. RN to arrange it with Dr.Mirande. FOBT ordered.   - will need pap smear, mammogram etc.    - CTA shows some mediastinal LNpathy, follow up CTs to check resolution. Will need PCP for this.   ??  3) Wt loss: age appropriate cancer screening needed. ?she will need colonoscopy and if that's neg an EGD. Also a pap smear, mammogram.   - CTA shows some mediastinal LNpathy, follow up CTs to check resolution.   - needs PCP. RM consulted.   ??  4) leucocytosis with thrombocytosis: WBC elevation could be from stress, vs inflammatory process. Doubt has  CAP . Procal neg #3. Will do resp culture,  Dc IV Rocephin and azithromax, MRSA pcr neg.   - RTDP  - check u/a.  - thrombocytosis sec to IDA, stress.   ??  5) hypomagnesemia: 2gms IV given today.   ?  6) morbid obesity: wt loss recommended, diet and exercise recommended.   ??  7) b/l hilar and mediastinal LNpathy: outpt follow up with CT as recommended.   ?  8) hypokalemia, mild: replacing daily as will be needing lasix.   ?  9) Elevated INR: Pt not on coumadin. Also has some mild transaminitis on admission. CTA chest showed a small hypodense lesion seen about the falciform ligament of the liver which is too small to characterize.  - USG abd ordered to study  hepatic morphology. CMP in am.   ??  DVT /gi Prophylaxis. Heparin and PPI.       All  questions answered. Pt voiced full understanding of what was explained. Pt being  trasnferred today, in stable condition, to [facility].   Physical Exam:   Vitals:    01/23/16 1048   BP: 102/73   Pulse: 94   Resp:    Temp:    SpO2: 90%     ?  Gen: no distress. Speaking in full sentences.   RS: bibasilar creps, R>L  Abd: soft, NT  CVS: s1,s2, esm  CNS: no deficits. AO#4.   MSK: 1+ pitting pedal edema b/l.       Significant Diagnostic Studies:   X-ray Chest Pa Or Ap    Result Date: 01/21/2016  PROCEDURE: XR CHEST PA OR AP  COMPARISON: None.  INDICATIONS: shortness of breath  FINDINGS:   LINES/TUBES: None.  HEART SIZE: Mild cardiac enlargement.  MEDIASTINUM: Normal.  LUNGS/PLEURA: Scattered reticular  opacities are present in a perihilar distribution with some indistinctness of the vasculature: Findings of likely mild edema.  No definite pleural abnormality.  OTHER: None.  CONCLUSION: See above.             Report Dictated and Electronically Signed ByJeanice Lim on 01/21/2016 at 17:13          Ct Angiogram Pulmonary    Result Date: 01/21/2016  PROCEDURE:  CT ANGIOGRAM PULMONARY  CONTRAST NONIONIC CONTRAST  COMPARISON: None.  INDICATIONS: dyspnea / chest pain evaluate pe  TECHNIQUE: 2mm CT images were obtained of the chest with narrow collimation after the injection of IV nonionic contrast timed per protocol for maximum opacification of the pulmonary arteries.  Coronal MIP reformatted images were obtained. Automated exposure control based on patient size was utilized as a dose reduction technique.  FINDINGS: The lack of a proper patient breath hold and the patient's right arm being in the downward position resulting in spray artifact somewhat limits the evaluation of fine detail.  PULMONARY ARTERIES: No large main branch or segmental pulmonary embolism is seen.  LUNGS: There are small bilateral pleural effusions, slightly larger on  the right. There is diffuse geographic ground-glass attenuation seen throughout both lungs along with associated mild interlobular septal thickening. These findings are nonspecific but most likely reflective of pulmonary edema given cardiomegaly and the pleural effusions. There are a few areas of patchy opacity seen scattered throughout both lungs which are likely reflective of atelectasis and/or scarring.  THORACIC AORTA: No aneurysm. Minimal atherosclerotic aortic calcification. HEART / PERICARDIUM: Moderate cardiomegaly. No significant pericardial effusion.  MEDIASTINUM/HILA: There are multiple mildly prominent mediastinal and bilateral hilar lymph nodes which may be reactive in etiology although other etiologies cannot completely be excluded. A follow-up CT scan of the chest is recommended in 3-4 months to assess for the resolution of the lymphadenopathy.  CHEST WALL: Normal. LIMITED ABDOMEN: There is a small hypodense lesion seen about the falciform ligament of the liver which is too small to characterize. A tiny calcified granuloma is noted within the spleen. OTHER: There is moderate rightward convexity scoliosis of the thoracic spine along with moderate multilevel degenerative change.  CONCLUSION: See above.             Report Dictated and Electronically Signed By:  Francella Solian on 01/21/2016 at 16:15          Ultrasound Carotid Bilateral Duplex    Result Date: 01/22/2016  PROCEDURE: US CAROTID BILATERAL DUPLEX  COMPARISON: None.  INDICATIONS: aortic stenosis, pre surgery  TECHNIQUE: A GE scanner utilizing B-Mode, pulsed Doppler and color flow imaging was used to insonate and visualize both right and left extracranial carotid arteries.  Blood flow velocities and directions were recorded.   PEAK FLOW VELOCITIES (cm/second):  Right Common Carotid Artery (RCCA): 55.47 cm/s  Right Internal Carotid Artery (RICA):  Proximal: 39.77 cm/s  Mid: 85.04 cm/s  Distal: -75.34 cm/s  Right External Carotid Artery  (RECA): 122.23 cm/s   Left Common Carotid Artery (LCCA): 88.43 cm/s  Left Internal Carotid Artery (RICA):  Proximal: 77.04 cm/s  Mid: -65.66 cm/s  Left External Carotid Artery (LECA): -44.82 cm/s  IMAGING FINDINGS: RIGHT CAROTID: ICA/CCA RATIO:.67  BP:  Does not appear to be any hemodynamically significant stenosis LEFT CAROTID: ICA/CCA RATIO:.88  BP:  Does not appear to be any hemodynamically significant stenosis VERTEBRALS: Antegrade flow bilaterally.    CONCLUSION:   1. RIGHT internal carotid artery: No significant stenosis.  2. LEFT internal carotid  artery: No significant stenosis.  Grading of stenosis based on Radiologists in Ultrasound Consensus Criteria.  Peak systolic velocities of internal carotid arteries Normal: No plaque or intimal thickening <125 cm/sec = 0 - 49% diameter stenosis with plaque or intimal thickening 125 - 250 cm/sec = 50 - 69% and visible plaque 250 - 325 cm/sec = 70 - 90% >325 cm/sec = > 90% Near Occlusion: Marked narrowing on color doppler, velocity criteria may not apply Occlusion: No flow is seen with color/power doppler.  Ratio of peak systolic velocities Internal CA/Common CA 2.0-3.9 = 50 - 69% stenosis >4.0 = 70 - 99% stenosis            Report Dictated and Electronically Signed ByJeanice Lim on 01/22/2016 at 18:01          Echo 2d Complete Doppler Color Flow    Result Date: 01/22/2016  A. PATIENT INFORMATION  DATE OF STUDY: 01/22/2016 ORD DOCTOR: Eldridge Abrahams, MD. AGE: 70  HT. 168 cm  WT. 103 kg  BSA: 2.11 sq m  HR: 121 CLINICAL DIAGNOSIS: Acute on chronic systolic heart failure, systolic murmur.  B. M-MODE MEASUREMENTS LT Ventricle Diastole: 59(NL<64mm,33mm/sq m) RT Ventricle: 41(NL<15mm,21mm/sq m)                Systole: 49    Aortic Root: 30  (NL<71mm,<22mm/sq m) % Fractional Shortening: 17 (NL>25%)  Left Atrium: 46  (NL<72mm,<24mm/sq m) Diastolic Wall Thickness Septum: 17 (NL<50mm) Posterior wall: 14  (NL<95mm) Ascending aorta:  38  C. 2-D MEASUREMENTS Calculated  ejection fraction: 27%           Pericardial effusion:  INTERPRETATION: 1.  Patient has been in sinus rhythm through evaluation. 2.  Left ventricular size is moderately enlarged.  Global left ventricular systolic function is severely decreased.  There is global left ventricular hypokinesis noted.  Estimated left ventricular ejection fraction is 30%.  There is moderate to severe concentric left ventricular hypertrophy noted.  Also, flattening of the interventricular septum during systole and diastole suggesting pressure volume right ventricular overload. 3.  Left atrium is moderately dilated.  Right atrium is moderately dilated.  Right ventricle is moderately dilated with right ventricular hypokinesis noted. 4.  Aortic valve is severely thickened and calcified.  There is increased flow velocity through apical views 4.4 m/sec, peak instantaneous gradient is 76 mmHg, mean gradient 14 mmHg, calculated aortic valve area 0.3 to 0.4 sq cm consistent with severe to  critical aortic stenosis.  Mitral valve is mildly thickened.  There is mild to moderate mitral regurgitation. 5.  Tricuspid valve is mildly thickened.  There is moderate tricuspid regurgitation, estimated RVSP 52-62 mmHg in the range of moderate to severe pulmonary hypertension. 6.  Doppler evaluation of mitral inflow tract showed markedly increased E:A ratio suggesting severe diastolic dysfunction. 7.  No significant pericardial effusion. 8.  Inferior vena cava is dilated, collapsing with inspiration, suggesting elevated right atrial pressure. 9.  Ascending aorta appears to be moderately dilated. CONCLUSION:  Severe left ventricular systolic dysfunction, estimated LVEF 35%.  There is severe to critical aortic stenosis, calculated valve area 0.3 to 0.4 sq cm consistent with critical aortic stenosis.  There is also severe pulmonary hypertension with estimated RVSP 52-62 mmHg.  There is right pleural effusion noted as well.  KUCRA:BECKDA                  Ian Malkin, MD D:  01/22/2016 12:59:24 T:  01/22/2016 13:24:06     V/D:  [id number]             E:  /                                 Cardiac Cath    Result Date: 01/23/2016  PREPROCEDURE DIAGNOSES:  Severe aortic stenosis, plan for assessment of her coronary anatomy before aortic valve replacement surgery. PROCEDURE PERFORMED: 1.  Selective angiogram of right and left coronary system which showed normal coronary anatomy with very minimal distal plaquing. 2.  Left ventriculogram showed severe left ventricular systolic dysfunction, estimated LVEF of 30% with global left ventricular hypokinesis. 3.  Right heart cardiac catheterization showed moderate post-capillary pulmonary hypertension with mean pulmonary artery pressure of 42 mmHg; elevated wedge pressure, elevated left ventricular end-diastolic pressure, calculated aortic valve area was 0.46  sq cm consistent with severe aortic stenosis, aortic valve index 0.22 in the range of critical/severe aortic stenosis. DESCRIPTION OF PROCEDURE:  The risks, benefits and alternatives of the cardiac catheterization and left ventriculography were discussed with the patient and informed consent was obtained.  The right groin area was prepped and draped in the usual sterile fashion and 1% Xylocaine was used for local anesthesia.  A 5-French vascular sheath was placed into the right femoral artery by modified Seldinger technique.  Standard Judkins catheters were used to engage the coronary ostia, perform left angiography, and standard pigtail was used to perform left ventriculography.  No complication occurred during the procedure.  Ultrasound-guided access was performed from the right femoral artery and ultrasound-guided access was performed also for the right femoral vein.  A 7-French sheath was placed to the femoral vein and standard Swan-Ganz catheter was used to obtain hemodynamic measurements from the right heart cardiac catheterization. HEMODYNAMICS:  Right atrial pressure  13/12, mean 10 mmHg. Right ventricular pressure 55/2, right ventricle end-diastolic pressure 15 mmHg.  Pulmonary artery pressure 44/25, mean 42 mmHg.  Capillary wedge pressure 38/44, mean 33 mmHg.  Left ventricular pressure 158/11, left ventricular end-diastolic pressure 13 mmHg.  Left ventricular saturation 95.  Pulmonary artery saturation 48%. Pulmonary vascular resistance 173.  Cardiac output by Fick method 4.16.  Cardiac index 1.98, cardiac output by thermodilution method is 3.32, cardiac index of 1.58.  Simultaneous measurements of the left ventricle and aortic pressure with dual lumen catheter showed 78 mmHg, peak to peak gradient 55.4 mmHg, mean gradient with calculated aortic valve area 0.46 and aortic valve index 0.22 in the range of critical aortic stenosis. LEFT VENTRICULOGRAM:  Left ventricular function was assessed in RAO projection.  The estimated left ventricular ejection fraction of 30%.  There is global left ventricular hypokinesis noted.  No significant mitral regurgitation noted. LEFT ANGIOGRAM:  Left main coronary artery is a large-caliber vessel giving origin to large anterior descending coronary artery and large left circumflex coronary artery. Left anterior descending coronary artery is free of any critical disease.  There are 2 diagonal coronary arteries free of any critical stenosis.  There is very minimal distal plaquing and in the distal LAD. Circumflex coronary artery is an intermediate size vessel, free of any critical stenosis with very mild plaquing in the distal section. Right coronary artery is technically dominant, free of any critical stenosis. Angio-Seal was used for arterial closure.  The patient is going to be transferred back to [facility] for further management.  We are going to consult cardiovascular surgery about aortic valve replacement surgery versus TAVR versus aortic valvuloplasty.  KUCRA:BECKDA                  Ian Malkin, MD D:  01/23/2016 10:43:01 T:  01/23/2016  11:49:26     V/D:  [id number]             E:  /                                   Labs:  Results for orders placed or performed during the hospital encounter of 01/21/16 (from the past 48 hour(s))   POC Troponin I -Next Routine    Collection Time: 01/21/16  2:24 PM   Result Value Ref Range    POC Troponin 0.15 (H) 0.00 - 0.08 ng/mL   POC Chem 8 -Next Routine    Collection Time: 01/21/16  2:38 PM   Result Value Ref Range    POC Sodium 138 135 - 145 mmol/L    POC Potassium 3.5 3.5 - 5.1 mmol/L    POC Chloride 105 101 - 111 mmol/L    POC Ionized Calcium 5.3 4.5 - 5.3 mg/dL    POC BUN 22 (H) 6 - 20 mg/dL    POC Creatinine 0.8 0.6 - 1.3 mg/dL    POC Hematocrit 31 (L) 35 - 48 %PCV    POC Hemoglobin 10.5 (L) 11.7 - 16.5 g/dL    POC Total CO2 19 (L) 22 - 32 mmol/L    POC Anion Gap 18 12 - 20 mmol/L    POC Glucose 123 (H) 74 - 106 mg/dL   Pro BNP (SLM) -STAT    Collection Time: 01/21/16  2:45 PM   Result Value Ref Range    Pro B-Type Natriuretic Peptide 5485 (H) 0 - 900 pg/mL   Hepatic Function Panel -STAT    Collection Time: 01/21/16  2:45 PM   Result Value Ref Range    Albumin 3.6 3.5 - 5.0 g/dL    Bilirubin Direct <1.6 0.0 - 0.3 mg/dL    Alkaline Phosphatase 87 35 - 105 U/L    AST - Aspartate Aminotransferase 29 5 - 32 U/L    ALT - Alanine Amino transferase 35 (H) 5 - 33 U/L    Protein Total 7.0 6.1 - 7.9 g/dL    Bilirubin Total 1.09 0.10 - 1.70 mg/dL   Troponin T -STAT    Collection Time: 01/21/16  2:45 PM   Result Value Ref Range    Troponin T 0.08 (HCrit) 0.00-<0.01 ng/mL   CK (Creatine Kinase) -STAT    Collection Time: 01/21/16  2:45 PM   Result Value Ref Range    CK (Creatine Kinase) 64 26 - 192 U/L   CKMB (SLM) -STAT    Collection Time: 01/21/16  2:45 PM   Result Value Ref Range    CKMB 2.4 0.6 - 6.3 ng/mL   Myoglobin -STAT    Collection Time: 01/21/16  2:45 PM   Result Value Ref Range    Myoglobin 50 25 - 58 ng/mL   CBC with Auto Differential -STAT    Collection Time: 01/21/16  2:45 PM   Result Value Ref Range      WBC 12.2 (H) 4.5 - 11.0 10*3/?L    RBC 4.26 3.80 - 5.60 10*6/?L    Hemoglobin 8.6 (L) 11.7 - 16.5 g/dL    HCT 60.4 (L) 54.0 - 48.0 %    MCV 68.0 (L) 81.0 - 100.0 fL  MCH 20.3 (L) 28.3 - 33.3 pg    MCHC 29.8 (L) 32.5 - 36.0 g/dL    RDW 16.1 (H) 09.6 - 16.1 %    Platelet Count 487 (H) 150 - 405 10*3/?L    MPV 8.4 6.9 - 10.0 fL   Procalcitonin -Next Routine    Collection Time: 01/21/16  2:45 PM   Result Value Ref Range    Procalcitonin <0.05 <=0.09 ng/mL   Cellavision Differential -Next Routine    Collection Time: 01/21/16  2:45 PM   Result Value Ref Range    WBC 12.2 (H) 4.5 - 11.0 10*3/?L    Neutrophils % 79 (H) 50 - 74 %    Lymphocytes % 15 (L) 20 - 40 %    Monocytes % 4 2 - 12 %    Eosinophils % 0 0 - 5 %    Basophils % 2 0 - 2 %    Neutrophils, Absolute 9.7 (H) 2.0 - 7.4 10*3/?L    Lymphocytes, Absolute 1.8 1.0 - 4.0 10*3/?L    Monocytes, Absolute 0.5 0.2 - 1.0 10*3/?L    Eosinophils, Absolute 0.0 0.0 - 0.4 10*3/?L    Basophils, Absolute 0.2 (H) 0.0 - 0.1 10*3/?L    Total Cells Counted 100     Platelet Estimate Increased (A) Adequate    Anisocytosis 2+     Poikilocytosis 1+     Polychromasia 1+     Hypochromasia 1+     Microcytes 2+     Ovalocytes 1+     nRBC 2 (H) 0 #/100 WBC   CRP, High-Sensitivity -ASAP    Collection Time: 01/21/16  2:45 PM   Result Value Ref Range    CRP (High Sensitivity) 8.780 (H) 0.000 - 0.748 mg/dL   Reticulocyte Count -ASAP    Collection Time: 01/21/16  2:45 PM   Result Value Ref Range    Reticulocyte Relative Percent 2.9 (H) 1.0 - 2.3 %    Reticulocyte Absolute Count 0.121 (H) 0.037 - 0.109 10*6/?L   Blood Culture -x 2    Collection Time: 01/21/16  3:11 PM   Result Value Ref Range    Blood Culture Result No Growth at 18-24 hrs.    Lactic Acid, Plasma -STAT    Collection Time: 01/21/16  3:11 PM   Result Value Ref Range    Lactic Acid 1.5 0.5 - 2.2 mmol/L   Blood Culture -x 2    Collection Time: 01/21/16  3:12 PM   Result Value Ref Range    Blood Culture Result No Growth at 18-24 hrs.     Prepare RBC -STAT    Collection Time: 01/21/16  6:25 PM   Result Value Ref Range    Blood Product Code E4540J81     Blood Product Unit ID [id number]     Blood Product Unit ABO O     Blood Product Unit Rh POS     Blood Product Unit Crossmatch Compatible     Blood Product Status Presumed Transfused     Blood Product Unit [facility]     Blood Product Unit Expiration [id number]     Blood Product Blood Type Barcode 5100     Blood Product Code Description E0336 AS-1 RBC LR     Blood Product Code M8413K44     Blood Product Unit ID [id number]     Blood Product Unit ABO O     Blood Product Unit Rh POS     Blood Product Unit Crossmatch Compatible  Blood Product Status Presumed Transfused     Blood Product Unit [facility]     Blood Product Unit Expiration [id number]     Blood Product Blood Type Barcode 5100     Blood Product Code Description E0336 AS-1 RBC LR     Blood Product Code N5621H08     Blood Product Unit ID [id number]     Blood Product Unit ABO O     Blood Product Unit Rh POS     Blood Product Unit Crossmatch Compatible     Blood Product Status Unit Crossmatched - Ready to Transfuse     Blood Product Unit [facility]     Blood Product Unit Expiration [id number]     Blood Product Blood Type Barcode 5100     Blood Product Code Description E0336 AS-1 RBC LR     Blood Product Code D6644I34     Blood Product Unit ID [id number]     Blood Product Unit ABO O     Blood Product Unit Rh POS     Blood Product Unit Crossmatch Compatible     Blood Product Status Unit Crossmatched - Ready to Transfuse     Blood Product Unit [facility]     Blood Product Unit Expiration [id number]     Blood Product Blood Type Barcode 5100     Blood Product Code Description Y3016 AS-1 RBC LR    Glyco-Hemoglobin A1C -ASAP    Collection Time: 01/21/16  7:00 PM   Result Value Ref Range    % GLYCOHEMOGLOBIN (A1C) 5.2 %   Magnesium -Daily    Collection Time: 01/21/16  7:00 PM   Result Value Ref Range    Magnesium 1.6 1.3 -  2.5 mg/dL   Ferritin -ASAP    Collection Time: 01/21/16  7:00 PM   Result Value Ref Range    Ferritin 19 13 - 150 ng/mL   Iron & Total Iron Binding Capacity -ASAP    Collection Time: 01/21/16  7:00 PM   Result Value Ref Range    Total Iron Binding Capacity (Transferrin Saturation) 463 261 - 487 ug/dL    Iron 10 (L) 28 - 010 ug/dL    Iron Saturation 2 (L) 20 - 55 %    Transferrin 324 192 - 382 mg/dL   ABO/Rh -Once    Collection Time: 01/21/16  7:03 PM   Result Value Ref Range    ABO O     Rh (D) Positive    Antibody Screen -Once    Collection Time: 01/21/16  7:03 PM   Result Value Ref Range    Antibody Screen Negative    Troponin T -Q6H    Collection Time: 01/21/16 10:14 PM   Result Value Ref Range    Troponin T 0.09 (HCrit) 0.00-<0.01 ng/mL   CKMB (SLM) -Q6H    Collection Time: 01/21/16 10:14 PM   Result Value Ref Range    CKMB 2.1 0.6 - 6.3 ng/mL   Basic Metabolic Panel -Daily    Collection Time: 01/22/16  4:44 AM   Result Value Ref Range    Sodium 140 135 - 145 mmol/L    Potassium 3.48 (L) 3.50 - 5.10 mmol/L    Chloride 103.1 101.0 - 111.0 mmol/L    CO2 - Carbon Dioxide 18 (L) 22 - 32 mmol/L    Creatinine 0.75 0.60 - 1.30 mg/dL    BUN 19 8 - 20 mg/dL    BUN / Creatinine Ratio 25.3 (H) 12.0 - 20.0  Glucose 107 (H) 74 - 106 mg/dL    Calcium 9.9 8.6 - 09.8 mg/dL    Anion Gap 11.9 (H) 14.7 - 20.0 mmol/L    Glomerular Filtration Rate Estimate >60.0 >=60.0 mL/min/1.29m*2    GFR Additional Info      Osmolality Calculation 282.0 275.0 - 300.0 mOsm/kg   CBC Without Differential -Daily    Collection Time: 01/22/16  4:44 AM   Result Value Ref Range    WBC 11.5 (H) 4.5 - 11.0 10*3/?L    RBC 4.53 3.80 - 5.60 10*6/?L    Hemoglobin 10.1 (L) 11.7 - 16.5 g/dL    HCT 82.9 (L) 56.2 - 48.0 %    MCV 71.0 (L) 81.0 - 100.0 fL    MCH 22.3 (L) 28.3 - 33.3 pg    MCHC 31.4 (L) 32.5 - 36.0 g/dL    RDW 13.0 (H) 86.5 - 16.1 %    Platelet Count 427 (H) 150 - 405 10*3/?L    MPV 8.3 6.9 - 10.0 fL   Lipid Panel [facility]) -AM Draw    Collection  Time: 01/22/16  4:44 AM   Result Value Ref Range    Triglyceride 79.0 <=150.0 mg/dL    Cholesterol 87 <=784 mg/dL    Cholesterol, HDL 19 (L) >=60 mg/dL    LDL Calculated 52 0 - 129 mg/dL    Chol/HDL Ratio 4.6 0.0 - 5.0    VLDL Cholesterol 15.8 7.0 - 32.0 mg/dL   Protime Panel -AM Draw    Collection Time: 01/22/16  4:44 AM   Result Value Ref Range    Protime 17.1 (H) 10.0 - 13.2 sec    INR 1.6 (H) 0.9 - 1.1 INR   Thyroid Stimulating Hormone -AM Draw    Collection Time: 01/22/16  4:44 AM   Result Value Ref Range    TSH - Thyroid Stimulating Hormone 2.09 0.34 - 5.60 uIU/mL   Magnesium -Daily    Collection Time: 01/22/16  4:44 AM   Result Value Ref Range    Magnesium 1.6 1.3 - 2.5 mg/dL   Troponin T -O9G    Collection Time: 01/22/16  4:44 AM   Result Value Ref Range    Troponin T 0.08 (HCrit) 0.00-<0.01 ng/mL   CKMB (SLM) -Q6H    Collection Time: 01/22/16  4:44 AM   Result Value Ref Range    CKMB 2.4 0.6 - 6.3 ng/mL   Procalcitonin -AM Draw    Collection Time: 01/22/16  4:44 AM   Result Value Ref Range    Procalcitonin <0.05 <=0.09 ng/mL   Troponin T -Q6H    Collection Time: 01/22/16 10:50 AM   Result Value Ref Range    Troponin T 0.06 (HCrit) 0.00-<0.01 ng/mL   CKMB (SLM) -Q6H    Collection Time: 01/22/16 10:50 AM   Result Value Ref Range    CKMB 2.3 0.6 - 6.3 ng/mL   Influenza A and B, Molecular -Next Routine    Collection Time: 01/22/16  8:00 PM   Result Value Ref Range    Influenza A Negative Negative    Influenza B Negative Negative   MRSA (Methicillin Resistant Staph aureus) by PCR -STAT    Collection Time: 01/22/16  8:00 PM   Result Value Ref Range    MRSA DNA Result Negative Negative   Basic Metabolic Panel -STAT    Collection Time: 01/23/16 12:18 AM   Result Value Ref Range    Sodium 139 135 - 145 mmol/L    Potassium 4.03  3.50 - 5.10 mmol/L    Chloride 103.3 101.0 - 111.0 mmol/L    CO2 - Carbon Dioxide 20 (L) 22 - 32 mmol/L    Creatinine 0.90 0.60 - 1.30 mg/dL    BUN 25 (H) 8 - 20 mg/dL    BUN / Creatinine  Ratio 27.8 (H) 12.0 - 20.0    Glucose 105 74 - 106 mg/dL    Calcium 16.1 8.6 - 09.6 mg/dL    Anion Gap 04.5 40.9 - 20.0 mmol/L    Glomerular Filtration Rate Estimate >60.0 >=60.0 mL/min/1.61m*2    GFR Additional Info      Osmolality Calculation 282.0 275.0 - 300.0 mOsm/kg   CBC Without Differential -STAT    Collection Time: 01/23/16 12:18 AM   Result Value Ref Range    WBC 12.6 (H) 4.5 - 11.0 10*3/?L    RBC 4.60 3.80 - 5.60 10*6/?L    Hemoglobin 9.9 (L) 11.7 - 16.5 g/dL    HCT 81.1 (L) 91.4 - 48.0 %    MCV 71.9 (L) 81.0 - 100.0 fL    MCH 21.5 (L) 28.3 - 33.3 pg    MCHC 30.0 (L) 32.5 - 36.0 g/dL    RDW 78.2 (H) 95.6 - 16.1 %    Platelet Count 432 (H) 150 - 405 10*3/?L    MPV 7.9 6.9 - 10.0 fL   Protime Panel -STAT    Collection Time: 01/23/16 12:18 AM   Result Value Ref Range    Protime 18.0 (H) 10.0 - 13.2 sec    INR 1.6 (H) 0.9 - 1.1 INR   APTT -STAT    Collection Time: 01/23/16 12:18 AM   Result Value Ref Range    APTT 29 24 - 36 sec   Basic Metabolic Panel -Daily    Collection Time: 01/23/16  5:19 AM   Result Value Ref Range    Sodium 141 135 - 145 mmol/L    Potassium 3.69 3.50 - 5.10 mmol/L    Chloride 104.1 101.0 - 111.0 mmol/L    CO2 - Carbon Dioxide 20 (L) 22 - 32 mmol/L    Creatinine 0.82 0.60 - 1.30 mg/dL    BUN 24 (H) 8 - 20 mg/dL    BUN / Creatinine Ratio 29.3 (H) 12.0 - 20.0    Glucose 85 74 - 106 mg/dL    Calcium 21.3 8.6 - 08.6 mg/dL    Anion Gap 57.8 (H) 46.9 - 20.0 mmol/L    Glomerular Filtration Rate Estimate >60.0 >=60.0 mL/min/1.66m*2    GFR Additional Info      Osmolality Calculation 285.0 275.0 - 300.0 mOsm/kg   CBC Without Differential -Daily    Collection Time: 01/23/16  5:19 AM   Result Value Ref Range    WBC 12.2 (H) 4.5 - 11.0 10*3/?L    RBC 4.73 3.80 - 5.60 10*6/?L    Hemoglobin 10.5 (L) 11.7 - 16.5 g/dL    HCT 62.9 (L) 52.8 - 48.0 %    MCV 71.2 (L) 81.0 - 100.0 fL    MCH 22.2 (L) 28.3 - 33.3 pg    MCHC 31.2 (L) 32.5 - 36.0 g/dL    RDW 41.3 (H) 24.4 - 16.1 %    Platelet Count 437 (H) 150 -  405 10*3/?L    MPV 8.3 6.9 - 10.0 fL   Magnesium -Daily    Collection Time: 01/23/16  5:19 AM   Result Value Ref Range    Magnesium 1.5 1.3 - 2.5 mg/dL   Procalcitonin -  ASAP    Collection Time: 01/23/16  5:19 AM   Result Value Ref Range    Procalcitonin <0.05 <=0.09 ng/mL   Cath Lab O2 Saturations -Next Routine    Collection Time: 01/23/16  9:50 AM   Result Value Ref Range    BG CATH LAB HB 9.9 g/dL    BG CATH LAB V4UJ LV 94.9 %    BG CATH LAB O2HB PA 48.2 %       Pending Labs    Pending Labs     Order Current Status    POC Chem 8 -Next Routine Collected (01/21/16 1439)    POC Troponin I -Next Routine Collected (01/21/16 1439)    Blood Culture -x 2 Preliminary result    Blood Culture -x 2 Preliminary result        Pt transferred to [facility] for CT surgery evaluation for critical AS.      Time spent on discharge: 30 MIN    Signed:  Eldridge Abrahams, MD  01/23/16  12:05 PM

## 2016-01-23 NOTE — H&P (Addendum)
CARDIOTHORACIC SURGERY CONSULTATION    REFERRING PHYSICIAN:   Dr.  Ian Malkin    REASON FOR CONSULT: severe aortic stenosis with depressed EF    CHIEF COMPLAINT: shortness of breath    HISTORY OF PRESENT ILLNESS:    The patient is a 60 y.o. female with the below medical co-morbid conditions.      1. Severe symptomatic aortic stenosis   Ejection fraction 30%   Normal coronaries   Predominant symptom dyspnea   2. Severe Pulmonary Hypertension  3. Mild to Moderate Mitral regurgitation  4. Morbid obesity   Weight loss 50 pounds in the last one year  5. respiratory insufficiency  6. Non-ST elevation MI   The demand ischemia  7. Iron deficiency anemia   CT chest abdomen and pelvis reviewed no abdominal masses   Likely AVM secondary to aortic stenosis not a good candidate for sedation and EGD or colonoscopy well aortic stenosis is so critical    Patient's a 60 year old female who presented the [facility] falls in respiratory distress unable to breathe.  There are her troponin was elevated she underwent oxygen therapy and was given Lasix.  Cardiac workup showed normal coronary arteries with depressed LV function on echo and severe aortic stenosis valve area less than 0.4 velocity 4.4 m/s with an EF 30%.  She was transfused 2 units of PRBCs A [facility] hospital with a hemoglobin 8.8.  His immobility volume overload from heart failure    Patient hasn't been to the doctor in years she hasn't taken any medications nor does she take good care of herself.  [place] she is unable to consistently take meds.  She likes to use and asked to [employer] wood to heat her house.      After a few days of Lasix patient states that she was breathing much better she is 60% better per her suggestion.  I had a long talk with the family about the disease course why she was getting short of breath and discussion of heart failure.  They understand that she is given a need aortic valve replacement she wishes to have tissue  valve I do think she is a better surgical candidate and have her given her age and risk.    She has very poor dentition    REVIEW OF SYSTEMS  Review of Systems   Constitution: Positive for weakness. Negative for diaphoresis.   HENT: Negative for headaches and hoarse voice.    Eyes: Negative for vision loss in left eye and vision loss in right eye.   Cardiovascular: Positive for chest pain, dyspnea on exertion and leg swelling. Negative for palpitations.   Respiratory: Positive for cough, shortness of breath, sleep disturbances due to breathing and snoring. Negative for sputum production and wheezing.    Endocrine: Negative for polydipsia and polyphagia.   Hematologic/Lymphatic: Negative for bleeding problem.   Skin: Negative for dry skin, poor wound healing and skin cancer.   Musculoskeletal: Negative for joint swelling and muscle weakness.   Gastrointestinal: Negative for change in bowel habit, constipation, diarrhea, hematemesis and hematochezia.   Genitourinary: Negative for dysuria.   Neurological: Negative for disturbances in coordination.   Psychiatric/Behavioral: Negative for depression.   All other systems reviewed and are negative.      PAST MEDICAL HISTORY  Past Medical History:   Diagnosis Date   . History of blood transfusion      MEDICATIONS  . famotidine  20 mg Oral 2 times  per day   . furosemide  40 mg Intravenous 2 times per day   . heparin  5,000 Units Subcutaneous 2 times per day   . metoprolol tartrate  12.5 mg Oral 2 times per day       ALLERGIES  Sulfa (sulfonamide antibiotics)    PAST SURGICAL HISTORY  Past Surgical History:   Procedure Laterality Date   . TONSILLECTOMY     . TUBAL LIGATION     . TUBAL LIGATION  1980       FAMILY HISTORY  Family History   Problem Relation Age of Onset   . Cancer Mother    . Heart disease Father    . Heart disease Sister    . Cancer Brother    . Cancer Maternal Aunt    . Heart disease Maternal Aunt    . Cancer Maternal Uncle    . Heart disease Maternal Uncle            SOCIAL HISTORY  Second hand smoke exposure positive    Social History     Social History Main Topics   . Smoking status: Never Smoker   . Smokeless tobacco: Never Used   . Alcohol use No   . Drug use: No   . Sexual activity: Not Currently     Partners: Male     Social History   . Marital status: Married     Spouse name: N/A   . Number of children: N/A   . Years of education: N/A     Other Topics Concern   . None       STS RISK ASSESSMENT  To open STS Risk Calc use this link: STS Calc    Khali Perella is a 60 y.o. female.   Estimated body mass index is 36.42 kg/(m^2) as calculated from the following:    Height as of this encounter: 5' 5.98 (1.676 m).    Weight as of this encounter: 225 lb 8.5 oz (102.3 kg).    Ejection fraction Done:Yes, EF was: 25%  Heart failure within last 2 wks:  No  Race documented: No  Renal failure on dialysis: no   Creatinine (Last 3):  Lab Results   Component Value Date    CREATININES 0.88 01/24/2016    CREATININES 0.82 01/23/2016    CREATININES 0.90 01/23/2016     Cardiac presentation at time of this admission: NSTEMI  Cardiac presentation at time of surgery: No Symptoms  History of prior MI: No  Cardiac arrhythmias: No  Chronic lung disease: Mild  Cerebrovascular disease: No  Peripheral arterial disease:no  Diabetes: No  Hypertension history :  yes  Immunocompromise:  no  History of endocarditis: No  Coronary anatomy/disease known: No  Percent native artery stenosis known: No  Status of surgery: urgent    Resuscitation: No  Cardiogenic shock: No  NYHA classification: Class 4  Use of IABP: No  Inotropes: No  Previous Cardiac Intervention: No  Mitral Valve Disease: Yes  Aortic Valve Disease: Yes, WITH Aortic Stenosis  Mitral Insuff: mild  Tricuspid Insuff: mild  Aortic Insuff: Mild  Incidence of CV surgery: 1st CV surgery  Previous CAB: No  Previous valve: No  STS Risk calculated at   Procedure: AV Replacement   Risk of Mortality: 4.502%   Morbidity or Mortality: 21.902%   Long  Length of Stay: 10.05%   Short Length of Stay: 24.025%   Permanent Stroke: 0.736%   Prolonged Ventilation: 17.17%   DSW  Infection: 0.533%   Renal Failure: 4.127%   Reoperation: 8.272%       Score discussed with patient and family.      Physical Examination:    BP 99/68  Pulse 106  Temp 36.4 ?C (97.5 ?F) (Temporal)   Resp (!) 35  Ht 5' 5.98 (1.676 m)  Wt 225 lb 8.5 oz (102.3 kg)  SpO2 97%  BMI 36.42 kg/m2 No flowsheet data found.  GENERAL:  Alert, oriented, conversant, female  HEENT:  Extraocular movements are intact.  Pupils are reactive.  Oropharyngeal membranes are pink and moist.   NECK:  No adenopathy.  No bruits  LUNGS:  course  CARDIAC:  Regular rate and rhythm.  Tachycardia sinus    VASCULAR:  Radial pulses are symmetric.  There are no carotid bruits.  ABDOMEN:  Soft, nontender.  No gross organomegaly.    EXTREMITIES:  No erythremia, edema, no cyanosis.  Capillary refill in hands and feet is normal  NEUROLOGIC:  No lateralizing weakness, sensory loss to gross observation and her is stable and symmetric  PSYCHIATRIC:  Mood, memory, and cognitive function appears normal to gross observation and discussion.  ENDOCRINE:  No polyuria or polydipsia.  No goiter is noted.    Diagnostic Studies:    1. Echocardiogram   C. 2-D MEASUREMENTS  Calculated ejection fraction: 27%           Pericardial effusion:   ??  INTERPRETATION:  1.  Patient has been in sinus rhythm through evaluation.  2.  Left ventricular size is moderately enlarged.  Global left ventricular systolic function is severely decreased.  There is global left ventricular hypokinesis noted.  Estimated left ventricular ejection fraction is 30%.  There is moderate to severe   concentric left ventricular hypertrophy noted.  Also, flattening of the interventricular septum during systole and diastole suggesting pressure volume right ventricular overload.  3.  Left atrium is moderately dilated.  Right atrium is moderately dilated.  Right ventricle is  moderately dilated with right ventricular hypokinesis noted.  4.  Aortic valve is severely thickened and calcified.  There is increased flow velocity through apical views 4.4 m/sec, peak instantaneous gradient is 76 mmHg, mean gradient 14 mmHg, calculated aortic valve area 0.3 to 0.4 sq cm consistent with severe to   critical aortic stenosis.  Mitral valve is mildly thickened.  There is mild to moderate mitral regurgitation.  5.  Tricuspid valve is mildly thickened.  There is moderate tricuspid regurgitation, estimated RVSP 52-62 mmHg in the range of moderate to severe pulmonary hypertension.  6.  Doppler evaluation of mitral inflow tract showed markedly increased E:A ratio suggesting severe diastolic dysfunction.  7.  No significant pericardial effusion.  8.  Inferior vena cava is dilated, collapsing with inspiration, suggesting elevated right atrial pressure.  9.  Ascending aorta appears to be moderately dilated.  ?  CONCLUSION:  Severe left ventricular systolic dysfunction, estimated LVEF 35%.  There is severe to critical aortic stenosis, calculated valve area 0.3 to 0.4 sq cm consistent with critical aortic stenosis.  There is also severe pulmonary hypertension   with estimated RVSP 52-62 mmHg.  There is right pleural effusion noted as well.  ??  2. Pulmonary Function Test   completed    3. Angiogram   Very large coronary arteries  No obstructive coronary disease  Severe aortic stenosis valve was visualized on angiogram moving minimally    4. Carotid Duplex scan  No limiting carotid artery disease  PEAK FLOW VELOCITIES (cm/second):                  Right Common Carotid Artery (RCCA):           55.47 cm/s                  Right Internal Carotid Artery (RICA):                  Proximal:             39.77 cm/s                  Mid:            85.04 cm/s                  Distal:               -75.34 cm/s                  Right External Carotid Artery (RECA):               122.23 cm/s  ??                  Left  Common Carotid Artery (LCCA):            88.43 cm/s                  Left Internal Carotid Artery (RICA):                  Proximal:             77.04 cm/s                  Mid:            -65.66 cm/s                  Left External Carotid Artery (LECA):                -44.82 cm/s  ??  IMAGING FINDINGS:  RIGHT CAROTID:              ICA/CCA RATIO:.67            BP:                  Does not appear to be any hemodynamically significant stenosis   LEFT CAROTID:               ICA/CCA RATIO:.88            BP:                  Does not appear to be any hemodynamically significant stenosis   VERTEBRALS:           Antegrade flow bilaterally.    ??  ??  CONCLUSION:             ??  1. RIGHT internal carotid artery: No significant stenosis.    2. LEFT internal carotid artery: No significant stenosis.  ??    5. Labs   Lab Results   Component Value Date    NA 139 01/24/2016    K 4.0 01/24/2016    K 3.69 01/23/2016    CL 106 01/24/2016    CL 104.1 01/23/2016    CO2 21.5 01/24/2016    CO2 20 (L) 01/23/2016    GLU 96 01/24/2016  GLU 85 01/23/2016    BUN 25 (H) 01/24/2016    CREATININES 0.88 01/24/2016    CALCIUM 10.3 01/24/2016    AST 29 01/21/2016    ALT 35 (H) 01/21/2016    ALKPHOS 87 01/21/2016    BILITOT 0.90 01/21/2016    ALBUMIN 3.6 01/21/2016    ANIONGAP 11.5 (H) 01/24/2016    ANIONGAP 21.0 (H) 01/23/2016    LABGLOM >60 01/24/2016    LABGLOM >60.0 01/23/2016    WHITEBLOODCE 11.6 (H) 01/24/2016    LABPLAT 427 (H) 01/24/2016    HGB 10.1 (L) 01/24/2016    PROTIME 18.0 (H) 01/23/2016    INR 1.6 (H) 01/23/2016    APTT 29 01/23/2016         6. CT SCAN/XRAY   No ascending aortic calcifications bilateral pleural effusions massive heart    CT abdomen and pelvis without liver masses    7. OTHER IMAGING    -STRESS   -Vein Studies      Impression:patient's a 60 year old female with multiple medical comorbidities who has severe critical aortic stenosis presenting with respiratory insufficiency    Decision Making:patient needs aortic  valve replacement tissue valve    Plan:tissue valve aortic valve replacement this week    The patient and her husband and I have discussed the risks of aortic valve replacement these included, but are not limited to stroke, permanent neurologic change, bleeding requiring blood transfusions, infection, repeat operations, organ failure, and even death.  She has a clear understanding of the situation and has asked appropriate questions.   She would like to proceed with surgery in the next few days      Otho Bellows MD  Cardiothoracic Surgery

## 2016-01-23 NOTE — Progress Notes (Signed)
Daily Progress Note   Name: Brandy [name]  DOB: February 13, 1956 60 y.o.  MRN: [id number]  CSN: [id number]  PCP:NO FAMILY PHYSICIAN,    Subjective:     Brandy [name] is a 60 y.o. female patient. Patient was admitted for congestive heart failure symptoms yesterday.  Shortness of breath slightly improved a little decongestion therapy.  Patient is planned for right and left cardiac catheterization today for assessment of her coronary anatomy before her valve replacement surgery.  She did not really have any major reaction after blood transfusion.  Her hemoglobin improved.  She is relatively stable.  She did not really have any major complaints on today's evaluation.     All of ROS were reviewed and are normal except as stated.  Objective:     Vitals Current 24 Hour Min / Max      Temp    36.8 ?C (98.3 ?F)    Temp  Min: 36.2 ?C (97.1 ?F)  Max: 36.9 ?C (98.5 ?F)      BP     104/73     BP  Min: 91/66  Max: 118/87      HR    100    Pulse  Min: 99  Max: 127      RR    20    Resp  Min: 16  Max: 28      Sats      SpO2: 93 % on  L/min       SpO2  Min: 91 %  Max: 96 %      Weight    102.3 kg (225 lb 9.6 oz)  Body mass index is 36.41 kg/(m^2).    Admit: 102.5 kg (226 lb)     Intake/Ouput:    Intake/Output Summary (Last 24 hours) at 01/23/16 1034  Last data filed at 01/23/16 0410   Gross per 24 hour   Intake              536 ml   Output              275 ml   Net              261 ml        Exam:    General appearance: alert, well appearing, and in no distress.    CV exam: normal rate, regular rhythm, normal S1, S2, systolic murmur 3/6 murmurs, rubs, clicks or gallops. JVP mildly elevated    Chest: clear to auscultation, no wheezes, rales or rhonchi, symmetric air entry.     Abdominal exam: soft, nontender, nondistended, no masses or organomegaly.    Exam of extremities: peripheral pulses normal, no pedal edema, no clubbing or cyanosis    Skin: Skin color, texture, turgor normal. No rashes or lesions    Neuro: alert, oriented x3,  affect appropriate, no focal neurological deficits, moves all extremities well and no involuntary movements      Scheduled Medications:  . aspirin  81 mg Oral Daily   . atorvastatin  20 mg Oral Nightly   . carvedilol  3.125 mg Oral BID with meals   . ferrous sulfate  325 mg Oral BID with meals   . furosemide  20 mg Intravenous 2 times per day   . guaiFENesin  600 mg Oral 2 times per day   . heparin  5,000 Units Subcutaneous Q8H SCH   . magnesium sulfate IVPB  2 g Intravenous Once   . pantoprazole  40 mg  Oral QAM AC   . potassium chloride SA  40 mEq Oral Daily   . sodium chloride 0.9 % (NS) syringe  5 mL Intravenous Q8H SCH       Infusions:   . sodium chloride 0.9 %         PRN Medications: acetaminophen, albuterol, atropine, diazePAM, diphenhydrAMINE **OR** diphenhydrAMINE, HYDROcodone-acetaminophen, nitroglycerin, sodium chloride 0.9% (NS), ondansetron, sodium chloride, sodium chloride 0.9 % (NS) syringe    Recent Labs:  Results for orders placed or performed during the hospital encounter of 01/21/16 (from the past 24 hour(s))   Troponin T -Q6H    Collection Time: 01/22/16 10:50 AM   Result Value Ref Range    Troponin T 0.06 (HCrit) 0.00-<0.01 ng/mL   CKMB ([facility]) -Q6H    Collection Time: 01/22/16 10:50 AM   Result Value Ref Range    CKMB 2.3 0.6 - 6.3 ng/mL   Influenza A and B, Molecular -Next Routine    Collection Time: 01/22/16  8:00 PM   Result Value Ref Range    Influenza A Negative Negative    Influenza B Negative Negative   MRSA (Methicillin Resistant Staph aureus) by PCR -STAT    Collection Time: 01/22/16  8:00 PM   Result Value Ref Range    MRSA DNA Result Negative Negative   Basic Metabolic Panel -STAT    Collection Time: 01/23/16 12:18 AM   Result Value Ref Range    Sodium 139 135 - 145 mmol/L    Potassium 4.03 3.50 - 5.10 mmol/L    Chloride 103.3 101.0 - 111.0 mmol/L    CO2 - Carbon Dioxide 20 (L) 22 - 32 mmol/L    Creatinine 0.90 0.60 - 1.30 mg/dL    BUN 25 (H) 8 - 20 mg/dL    BUN / Creatinine Ratio 27.8  (H) 12.0 - 20.0    Glucose 105 74 - 106 mg/dL    Calcium 16.1 8.6 - 09.6 mg/dL    Anion Gap 04.5 40.9 - 20.0 mmol/L    Glomerular Filtration Rate Estimate >60.0 >=60.0 mL/min/1.65m*2    GFR Additional Info      Osmolality Calculation 282.0 275.0 - 300.0 mOsm/kg   CBC Without Differential -STAT    Collection Time: 01/23/16 12:18 AM   Result Value Ref Range    WBC 12.6 (H) 4.5 - 11.0 10*3/?L    RBC 4.60 3.80 - 5.60 10*6/?L    Hemoglobin 9.9 (L) 11.7 - 16.5 g/dL    HCT 81.1 (L) 91.4 - 48.0 %    MCV 71.9 (L) 81.0 - 100.0 fL    MCH 21.5 (L) 28.3 - 33.3 pg    MCHC 30.0 (L) 32.5 - 36.0 g/dL    RDW 78.2 (H) 95.6 - 16.1 %    Platelet Count 432 (H) 150 - 405 10*3/?L    MPV 7.9 6.9 - 10.0 fL   Protime Panel -STAT    Collection Time: 01/23/16 12:18 AM   Result Value Ref Range    Protime 18.0 (H) 10.0 - 13.2 sec    INR 1.6 (H) 0.9 - 1.1 INR   APTT -STAT    Collection Time: 01/23/16 12:18 AM   Result Value Ref Range    APTT 29 24 - 36 sec   Basic Metabolic Panel -Daily    Collection Time: 01/23/16  5:19 AM   Result Value Ref Range    Sodium 141 135 - 145 mmol/L    Potassium 3.69 3.50 - 5.10 mmol/L  Chloride 104.1 101.0 - 111.0 mmol/L    CO2 - Carbon Dioxide 20 (L) 22 - 32 mmol/L    Creatinine 0.82 0.60 - 1.30 mg/dL    BUN 24 (H) 8 - 20 mg/dL    BUN / Creatinine Ratio 29.3 (H) 12.0 - 20.0    Glucose 85 74 - 106 mg/dL    Calcium 16.1 8.6 - 09.6 mg/dL    Anion Gap 04.5 (H) 40.9 - 20.0 mmol/L    Glomerular Filtration Rate Estimate >60.0 >=60.0 mL/min/1.57m*2    GFR Additional Info      Osmolality Calculation 285.0 275.0 - 300.0 mOsm/kg   CBC Without Differential -Daily    Collection Time: 01/23/16  5:19 AM   Result Value Ref Range    WBC 12.2 (H) 4.5 - 11.0 10*3/?L    RBC 4.73 3.80 - 5.60 10*6/?L    Hemoglobin 10.5 (L) 11.7 - 16.5 g/dL    HCT 81.1 (L) 91.4 - 48.0 %    MCV 71.2 (L) 81.0 - 100.0 fL    MCH 22.2 (L) 28.3 - 33.3 pg    MCHC 31.2 (L) 32.5 - 36.0 g/dL    RDW 78.2 (H) 95.6 - 16.1 %    Platelet Count 437 (H) 150 - 405  10*3/?L    MPV 8.3 6.9 - 10.0 fL   Magnesium -Daily    Collection Time: 01/23/16  5:19 AM   Result Value Ref Range    Magnesium 1.5 1.3 - 2.5 mg/dL   Procalcitonin -ASAP    Collection Time: 01/23/16  5:19 AM   Result Value Ref Range    Procalcitonin <0.05 <=0.09 ng/mL   Cath Lab O2 Saturations -Next Routine    Collection Time: 01/23/16  9:50 AM   Result Value Ref Range    BG CATH LAB HB 9.9 g/dL    BG CATH LAB O1HY LV 94.9 %    BG CATH LAB O2HB PA 48.2 %     Pending Labs     Order Current Status    POC Chem 8 -Next Routine Collected (01/21/16 1439)    POC Troponin I -Next Routine Collected (01/21/16 1439)    Blood Culture -x 2 Preliminary result    Blood Culture -x 2 Preliminary result          Recent Imaging:  Ultrasound Carotid Bilateral Duplex    Result Date: 01/22/2016  PROCEDURE: US CAROTID BILATERAL DUPLEX  COMPARISON: None.  INDICATIONS: aortic stenosis, pre surgery  TECHNIQUE: A GE scanner utilizing B-Mode, pulsed Doppler and color flow imaging was used to insonate and visualize both right and left extracranial carotid arteries.  Blood flow velocities and directions were recorded.   PEAK FLOW VELOCITIES (cm/second):  Right Common Carotid Artery (RCCA): 55.47 cm/s  Right Internal Carotid Artery (RICA):  Proximal: 39.77 cm/s  Mid: 85.04 cm/s  Distal: -75.34 cm/s  Right External Carotid Artery (RECA): 122.23 cm/s   Left Common Carotid Artery (LCCA): 88.43 cm/s  Left Internal Carotid Artery (RICA):  Proximal: 77.04 cm/s  Mid: -65.66 cm/s  Left External Carotid Artery (LECA): -44.82 cm/s  IMAGING FINDINGS: RIGHT CAROTID: ICA/CCA RATIO:.67  BP:  Does not appear to be any hemodynamically significant stenosis LEFT CAROTID: ICA/CCA RATIO:.88  BP:  Does not appear to be any hemodynamically significant stenosis VERTEBRALS: Antegrade flow bilaterally.    CONCLUSION:   1. RIGHT internal carotid artery: No significant stenosis.  2. LEFT internal carotid artery: No significant stenosis.  Grading of stenosis based on  Radiologists in  Ultrasound Consensus Criteria.  Peak systolic velocities of internal carotid arteries Normal: No plaque or intimal thickening <125 cm/sec = 0 - 49% diameter stenosis with plaque or intimal thickening 125 - 250 cm/sec = 50 - 69% and visible plaque 250 - 325 cm/sec = 70 - 90% >325 cm/sec = > 90% Near Occlusion: Marked narrowing on color doppler, velocity criteria may not apply Occlusion: No flow is seen with color/power doppler.  Ratio of peak systolic velocities Internal CA/Common CA 2.0-3.9 = 50 - 69% stenosis >4.0 = 70 - 99% stenosis            Report Dictated and Electronically Signed ByJeanice Lim on 01/22/2016 at 18:01                Assessment & Plan:     Active Hospital Problems    Diagnosis SNOMED CT(R) Date Noted   . Aortic stenosis, severe AORTIC VALVE STENOSIS 01/22/2016   . Acute systolic heart failure (CMS/HCC) ACUTE SYSTOLIC HEART FAILURE 01/22/2016   . Heart failure (CMS/HCC) HEART FAILURE 01/21/2016   . Elevated brain natriuretic peptide (BNP) level HORMONE LEVEL - FINDING 01/21/2016   . NSTEMI (non-ST elevated myocardial infarction) (CMS/HCC) ACUTE NON-ST SEGMENT ELEVATION MYOCARDIAL INFARCTION 01/21/2016   . Demand ischemia of myocardium (CMS/HCC) ACUTE ISCHEMIC HEART DISEASE 01/21/2016   . Tachycardia TACHYCARDIA 01/21/2016   . Pleural effusion PLEURAL EFFUSION 01/21/2016   . Obesity OBESITY 01/21/2016   . Acute respiratory failure with hypoxia (CMS/HCC) ACUTE RESPIRATORY FAILURE 01/21/2016   . Hypotension LOW BLOOD PRESSURE 01/21/2016   . Iron deficiency anemia IRON DEFICIENCY ANEMIA 01/21/2016   . Thrombocytosis (CMS/HCC) THROMBOCYTOSIS 01/21/2016   . Weight loss WEIGHT DECREASED 01/21/2016   . Family history of cancer FAMILY HISTORY OF CANCER 01/21/2016   . Cardiomegaly CARDIOMEGALY 01/21/2016   . Mediastinal lymphadenopathy MEDIASTINAL LYMPHADENOPATHY 01/21/2016   . Hilar lymphadenopathy- bilataeral  HILAR LYMPHADENOPATHY 01/21/2016   . Chest pain CHEST PAIN 01/21/2016    . Dyspnea and respiratory abnormality DYSPNEA 01/21/2016   . Elevated troponin HIGH TROPONIN I LEVEL 01/21/2016   . Abnormal EKG ELECTROCARDIOGRAM ABNORMAL 01/21/2016   . Opacity of lung on imaging study ABNORMAL FINDINGS ON DIAGNOSTIC IMAGING OF LUNG 01/21/2016   . Liver lesion- small hypodense lesion around falciform lig LESION OF LIVER 01/21/2016           CARDIOLOGY ASSESSMENT:      1.  Severe aortic stenosis - the patient had right and left cardiac catheterization today which showed nonobstructive coronary artery disease with moderate left ventricular systolic dysfunction, estimated LVEF was 30% and calculated aortic valve area was 0.46 with aortic valve index 0.22 in the range of critical aortic stenosis.  Considering that patient has severe aortic stenosis, severe left ventricular systolic dysfunction and she is symptomatic on minimal exertion, I talked to Otho Bellows, MD, cardiovascular surgeon at [facility] about surgical assessment and aortic valve replacement surgery.  Patient is willing to consider it done in the next few days, so we are going to make arrangements for her to be transferred to [facility] for aortic valve replacement.  However, because of her systolic dysfunction and anemia, she also would be open to a discussion about TAVR versus valvuloplasty for transition.  She seems to be still in heart failure but symptom-wise has been improving slowly after blood transfusion.  She is not as short of breath as she was on admission.  2.  Anemia - improved after 2  blood unit transfusion.  Definitely she will need workup for that as an outpatient.  So far we did not see any major source.  3.  Patient had a vascular study done for carotid stenosis.  Both carotid arteries where without significant stenosis.     KUCRA:BECKDA                     Ian Malkin, MD  D:  01/23/2016 10:38:03            T:  01/23/2016 11:35:07            V/D:  [id number]               E:  /                                      Electronically signed:Chastelyn Athens [id number]/13/201710:34 AM  This dictation was produced using voice recognition software. Despite concurrent proofreading, please note transcription errors may be present and that these errors may not truly reflect my intent.

## 2016-01-23 NOTE — Brief Op Note (Signed)
Cardiology Brief Operative Note    Performing Cardiologist and Assistants:  Physician - Cardiologist  Tech - Scrub Tech  Nurse - Circulator Cath Lab  Nurse - Circulator Cath Lab  Tech - Monitor Cath Lab [place], [place]  Davene Costain  [place]     Pre-Op Diagnosis: Congestive Heart Failure and Aortic Valve Disease    Post-Op Diagnosis:  Congestive Heart Failure and Aortic Valve Disease    Procedure Performed:  Right and Left Heart Catheterization    Specimen(s) Removed:  None    Estimated Blood Loss: Less than 30 cc    ASA Score:ASA 3 - Patient with moderate systemic disease with functional limitations   Sedation Level: Moderate Sedation   Sedation Duration: 60 minutes  Sedation Documents Reviewed   LM: ok  LAD: distal LIS  LCX: small, diffuse LIS  RCA: dominant ok  EF 30% glovbal hypokinesis  LVEDP 30 mmHg  Severe Aortic stenosis AVA 0.44  PA 44/45 (42)  PCW 30    Findings:  Functionally Normal Coronary Anatomy  and Aortic Valve Disease    Complications:  None    Disposition:  [facility] - PCU

## 2016-01-23 NOTE — Other (Signed)
Patient arrived to PCU via bed in stable condition.  Post cath  instructions  given to patient at bedside. Patient does  verbalize understanding. Right groin dressing was visualized with Lysle Morales, RN, who is at patient's bedside. Dressing is CDI. Patient is awake with no complaint of pain or discomfort.

## 2016-01-24 ENCOUNTER — Inpatient Hospital Stay: Admit: 2016-01-24 | Payer: MEDICAID

## 2016-01-24 LAB — CBC WITH AUTO DIFFERENTIAL
Basophils %: 1 % (ref 0–2)
Basophils, Absolute: 0.1 10*3/ÂµL (ref 0.0–0.2)
Eosinophils %: 3 % (ref 0–7)
Eosinophils, Absolute: 0.3 10*3/ÂµL (ref 0.0–0.7)
HCT: 33.3 % — ABNORMAL LOW (ref 37.0–48.0)
Hemoglobin: 10.1 g/dL — ABNORMAL LOW (ref 12.0–16.0)
Lymphocytes %: 22 % — ABNORMAL LOW (ref 25–45)
Lymphocytes, Absolute: 2.5 10*3/ÂµL (ref 1.1–4.3)
MCH: 21.3 pg — ABNORMAL LOW (ref 27.0–34.0)
MCHC: 30.3 g/dL — ABNORMAL LOW (ref 32.0–36.0)
MCV: 70.3 fL — ABNORMAL LOW (ref 81.0–99.0)
MPV: 8.3 fL (ref 7.4–10.4)
Monocytes %: 8 % (ref 0–12)
Monocytes, Absolute: 1 10*3/ÂµL (ref 0.0–1.2)
Neutrophils %: 66 % (ref 35–70)
Neutrophils, Absolute: 7.7 10*3/ÂµL — ABNORMAL HIGH (ref 1.6–7.3)
Platelet Count: 427 10*3/ÂµL — ABNORMAL HIGH (ref 150–400)
RBC: 4.73 10*6/ÂµL (ref 4.20–5.40)
RDW: 19.7 % — ABNORMAL HIGH (ref 11.5–14.5)
WBC: 11.6 10*3/ÂµL — ABNORMAL HIGH (ref 4.8–10.8)

## 2016-01-24 LAB — BASIC METABOLIC PANEL
Anion Gap: 11.5 mmol/L — ABNORMAL HIGH (ref 3.0–11.0)
BUN: 25 mg/dL — ABNORMAL HIGH (ref 6–23)
CO2 - Carbon Dioxide: 21.5 mmol/L (ref 21.0–31.0)
Calcium: 10.3 mg/dL (ref 8.6–10.3)
Chloride: 106 mmol/L (ref 98–111)
Creatinine: 0.88 mg/dL (ref 0.55–1.10)
GFR Estimate: >60 mL/min/{1.73_m2} (ref >=60)
Glucose: 96 mg/dL (ref 80–99)
Potassium: 4 mmol/L (ref 3.5–5.1)
Sodium: 139 mmol/L (ref 135–143)

## 2016-01-24 LAB — MRSA BY PCR: MRSA DNA Result: NOT DETECTED

## 2016-01-24 LAB — VRE CULTURE

## 2016-01-24 MED ORDER — metoprolol tartrate (LOPRESSOR) tablet 12.5 mg
25 | Freq: Two times a day (BID) | ORAL | Status: DC
Start: 2016-01-24 — End: 2016-01-27
  Administered 2016-01-24 – 2016-01-27 (×4): 25 mg via ORAL

## 2016-01-24 MED ORDER — furosemide (LASIX) injection 40 mg
10 | INTRAMUSCULAR | Status: DC
Start: 2016-01-24 — End: 2016-01-25
  Administered 2016-01-24 – 2016-01-25 (×2): 10 mg via INTRAVENOUS

## 2016-01-24 MED ORDER — famotidine (PEPCID) tablet 20 mg
20 | Freq: Two times a day (BID) | ORAL | Status: DC
Start: 2016-01-24 — End: 2016-01-27
  Administered 2016-01-24 – 2016-01-27 (×8): 20 mg via ORAL

## 2016-01-24 MED ORDER — iohexol (OMNIPAQUE) 350 mg iodine/mL injection 90 mL
350 | Freq: Once | INTRAVENOUS | Status: AC | PRN
Start: 2016-01-24 — End: 2016-01-24
  Administered 2016-01-24: 16:00:00 350 mL via INTRAVENOUS

## 2016-01-24 MED FILL — FUROSEMIDE 10 MG/ML INJECTION SOLUTION: 10 mg/mL | INTRAMUSCULAR | Qty: 6

## 2016-01-24 MED FILL — HEPARIN, PORCINE (PF) 5,000 UNIT/0.5 ML SYRINGE: 5000 unit/0.5 mL | INTRAMUSCULAR | Qty: 0.5

## 2016-01-24 MED FILL — FAMOTIDINE 20 MG TABLET: 20 mg | ORAL | Qty: 1

## 2016-01-24 MED FILL — METOPROLOL TARTRATE 25 MG TABLET: 25 mg | ORAL | Qty: 1

## 2016-01-24 MED FILL — FUROSEMIDE 10 MG/ML INJECTION SOLUTION: 10 mg/mL | INTRAMUSCULAR | Qty: 4

## 2016-01-24 NOTE — Spiritual Care Consult (Signed)
Spiritual Assessment  01/24/2016    Brandy [name] is a 60 y.o. year-old female who was admitted on 01/23/2016    Spiritual Assessment  Attempt / Visit #: First visit  Ministry To:: Patient  Patient Referred By: Rounds  Spiritual Distress Related To:: Quality of life issues  Spiritual Community Support: Unaffiliated and/or non-practicing  Primary Emotion: Anticipation  Secondary Emotion: Hopeful  Coping Mechanisms/Spiritual Practices: Family/Social support;Own personal strength/Resolve;Prayer/Spiritual practices;Trust in God/Higher power  Family Present?: No  Spiritual Care Interventions: Introduced Spiritual Care services  Spiritual Care Outcomes: Established spiritual care presence;Took ownership in recovery  Follow up: Will follow up as requested    Chaplain note:

## 2016-01-25 ENCOUNTER — Encounter

## 2016-01-25 ENCOUNTER — Inpatient Hospital Stay: Admit: 2016-01-25 | Payer: MEDICAID

## 2016-01-25 LAB — B-TYPE NATRIURETIC PEPTIDE: B-Type Natriuretic Peptide - BNP: 1421 pg/mL — ABNORMAL HIGH (ref 0–99)

## 2016-01-25 MED FILL — HEPARIN, PORCINE (PF) 5,000 UNIT/0.5 ML SYRINGE: 5000 unit/0.5 mL | INTRAMUSCULAR | Qty: 0.5

## 2016-01-25 MED FILL — FAMOTIDINE 20 MG TABLET: 20 mg | ORAL | Qty: 1

## 2016-01-25 MED FILL — FUROSEMIDE 10 MG/ML INJECTION SOLUTION: 10 mg/mL | INTRAMUSCULAR | Qty: 4

## 2016-01-25 MED FILL — METOPROLOL TARTRATE 25 MG TABLET: 25 mg | ORAL | Qty: 1

## 2016-01-25 NOTE — Progress Notes (Signed)
Received a page from MT stating pt had 13 beats psvt to 230s. Pt was asymptomatic and VSS with no changes since prior assessment. Labs ordered. K = 3.6 and Mag=1.4. Labs were brought to the PA's attention and a replacement protocol was ordered. Will continue to monitor closely.

## 2016-01-25 NOTE — Progress Notes (Addendum)
End of Shift Report     Significant Events Transferred to heart center from CCU.      Relevant   Assessment Findings A/Ox4, up ad lib, no pain, 2+ BLE edema. Metoprolol held D/T soft blood pressures. Heart rate is ST 100-120.       Patient/Family  Questions/Concerns Non productive cough     DC Barriers AVR on Thursday

## 2016-01-25 NOTE — Progress Notes (Signed)
Pre-op Cardiac education has been provided with patient, husband and sister. Questions answered. Patient getting more worn out having persistent cough, having to sit up to breathe. Bedside nurse aware. Merrily Pew RN

## 2016-01-25 NOTE — Progress Notes (Addendum)
RN concerned about patients Heart rate maintaining 130s-140s with soft labile blood pressures as well as fluid status (see vital sign flow sheet). Patient has 2-3+ BLE pitting Edema, Crackles noted in posterior lung fields. Patient states her primary symptoms are a nonproductive cough and inability to lay flat due to SOB. Patient's appears physically exhausted. PA and MD Debroah Baller notified of these findings. Critical care outreach nurse also consulted. Bedside nursing to closely monitor. Per MD Beta blocker will be held tonight and patient will be reassessed in am.

## 2016-01-25 NOTE — Progress Notes (Signed)
CARDIOTHORACIC SURGERY CRITICAL CARE NOTE     Name: Brandy [name]  DOB: 1956-02-21 60 y.o.  MRN: [id number]  CSN: [id number]    Brandy [name] is a 60 y.o. female patient. Patient was admitted with Respiratory failure (CMS/HCC)  Who had Procedure(s):  AORTIC VALVE REPLACEMENT.       HPI: The patient is a 60 y.o. female with the below medical co-morbid conditions.    ?  1. Severe symptomatic aortic stenosis                        Ejection fraction 30%                        Normal coronaries                        Predominant symptom dyspnea                     2. Morbid obesity                        Weight loss 50 pounds in the last one year  3. respiratory insufficiency  4. Non-ST elevation MI                        The demand ischemia  5. Iron deficiency anemia                        CT chest abdomen and pelvis reviewed no abdominal masses                        Likely AVM secondary to aortic stenosis not a good candidate for sedation and EGD or colonoscopy well aortic stenosis is so critical  ?  Patient's a 60 year old female who presented the [facility] in respiratory distress unable to breathe.  There are her troponin was elevated she underwent oxygen therapy and was given Lasix.  Cardiac workup showed normal coronary arteries with depressed LV function on echo and severe aortic stenosis valve area less than 0.4 velocity 4.4 m/s with an EF 30%.  She was transfused 2 units of PRBCs A [facility] hospital with a hemoglobin 8.8.  His immobility volume overload from heart failure  ?  Patient hasn't been to the doctor in years she hasn't taken any medications nor does she take good care of herself.  [place] she is unable to consistently take meds.  She likes to use and asked to [employer] wood to heat her house.    ?  After a few days of Lasix patient states that she was breathing much better she is 60% better per her suggestion.  I had a long talk with the family about the disease  course why she was getting short of breath and discussion of heart failure.  They understand that she is given a need aortic valve replacement she wishes to have tissue valve I do think she is a better surgical candidate and have her given her age and risk.  ?  She has very poor dentition    HD#1 patient continues to be tachycardic with normal coronary arteries?? Origin.  No pain   Heart failure BNP 1400     Subjective: happy she does not have evidence of abdominal  cancer    Assessment & Plan:     Active Hospital Problems    Diagnosis SNOMED CT(R) Date Noted   . Respiratory failure (CMS/HCC) RESPIRATORY FAILURE 01/23/2016   . Aortic stenosis, severe AORTIC VALVE STENOSIS 01/22/2016   . NSTEMI (non-ST elevated myocardial infarction) (CMS/HCC) ACUTE NON-ST SEGMENT ELEVATION MYOCARDIAL INFARCTION 01/21/2016   . Demand ischemia of myocardium (CMS/HCC) ACUTE ISCHEMIC HEART DISEASE 01/21/2016   . Iron deficiency anemia IRON DEFICIENCY ANEMIA 01/21/2016   . Obesity OBESITY 01/21/2016     Neurologic:   Awake and alert  Cardiac: Pre-op EF: 25%       Respiratory:    Smoker no   Pulmonary toilet    Xray:wet    Gastrointestinal:    Genitourinary: creat: 0.88    Infectious Disease:  WBC:11.6    Hematology: HGB 10.1   PLTS 427   Endocrine: Glucose management   Electrolytes: replacing Mg/K/Ca  Prophylaxis: heparin   PPI/H2 Blocker     Scheduled Medications:   . famotidine  20 mg Oral 2 times per day   . furosemide  20 mg Oral 2 times per day   . heparin  5,000 Units Subcutaneous 2 times per day   . metoprolol tartrate  12.5 mg Oral 2 times per day     Infusions:    Recent Labs:  Lab Results   Component Value Date    NA 139 01/24/2016    K 4.0 01/24/2016    K 3.69 01/23/2016    CL 106 01/24/2016    CL 104.1 01/23/2016    CO2 21.5 01/24/2016    CO2 20 (L) 01/23/2016    GLU 96 01/24/2016    GLU 85 01/23/2016    BUN 25 (H) 01/24/2016    CREATININES 0.88 01/24/2016    CALCIUM 10.3 01/24/2016    AST 29 01/21/2016    ALT 35 (H)  01/21/2016    ALKPHOS 87 01/21/2016    BILITOT 0.90 01/21/2016    ALBUMIN 3.6 01/21/2016    ANIONGAP 11.5 (H) 01/24/2016    ANIONGAP 21.0 (H) 01/23/2016    LABGLOM >60 01/24/2016    LABGLOM >60.0 01/23/2016    WHITEBLOODCE 11.6 (H) 01/24/2016    LABPLAT 427 (H) 01/24/2016    HGB 10.1 (L) 01/24/2016    PROTIME 18.0 (H) 01/23/2016    INR 1.6 (H) 01/23/2016    APTT 29 01/23/2016          Vital Signs:      ? Cardiac output      ? Cardiac Index    Temp: 36.7 ?C (98.1 ?F) BP: 104/60 Pulse: (!) 139 Resp: 18 SpO2: 98 % on   None (Room air)   Intake/Output:     CT OUTPUT:     URINE OUTPUT: 2400     Exam:  Neuro:no TIA or stroke-like symptoms  Cardiac:S1, S2 normal TACHY   Lungs;course in bases  ABDOMEN:abdomen is soft without significant tenderness, masses, organomegaly or guarding  EXTREMITIES:less then 2 second capillary refill    PLAN:   EKG  bblock  diuresis    Critical Care Time: 40 minutes outside any procedures and family discussion.         Otho Bellows

## 2016-01-26 ENCOUNTER — Inpatient Hospital Stay: Admit: 2016-01-27 | Discharge: 2016-01-27 | Payer: MEDICAID

## 2016-01-26 ENCOUNTER — Inpatient Hospital Stay: Admit: 2016-01-26 | Payer: MEDICAID

## 2016-01-26 LAB — MAGNESIUM: Magnesium: 1.4 mg/dL — ABNORMAL LOW (ref 1.6–2.4)

## 2016-01-26 LAB — ABO/RH (HCLL): Rh (D): POSITIVE

## 2016-01-26 LAB — ANTIBODY SCREEN (HCLL): Antibody Screen: NEGATIVE

## 2016-01-26 LAB — POTASSIUM: Potassium: 3.6 mmol/L (ref 3.5–5.1)

## 2016-01-26 MED ORDER — insulin regular (HumuLIN-R,NovoLIN) 150 Units in sodium chloride 0.9 % (NS) 150 mL (1 Units/mL) infusion - Cardiac surgery
100 | INTRAVENOUS | Status: DC
Start: 2016-01-26 — End: 2016-01-27

## 2016-01-26 MED ORDER — furosemide (LASIX) injection 60 mg
10 | Freq: Once | INTRAMUSCULAR | Status: DC
Start: 2016-01-26 — End: 2016-01-25

## 2016-01-26 MED ORDER — Normal saline infusion for medication/blood product administration for nursing
0.9 | INTRAVENOUS | Status: DC | PRN
Start: 2016-01-26 — End: 2016-01-27

## 2016-01-26 MED ORDER — carvedilol (COREG) tablet 6.25 mg
6.25 | Freq: Once | ORAL | Status: DC
Start: 2016-01-26 — End: 2016-01-26

## 2016-01-26 MED ORDER — aspirin 81 MG chewable tablet 81 mg
81 | Freq: Once | ORAL | Status: AC
Start: 2016-01-26 — End: 2016-01-26
  Administered 2016-01-26: 20:00:00 81 mg via ORAL

## 2016-01-26 MED ORDER — mupirocin (BACTROBAN) 2 % ointment
2 | Freq: Once | TOPICAL | Status: DC
Start: 2016-01-26 — End: 2016-01-26

## 2016-01-26 MED ORDER — furosemide (LASIX) tablet 20 mg
20 | ORAL | Status: DC
Start: 2016-01-26 — End: 2016-01-25

## 2016-01-26 MED ORDER — ceFAZolin (ANCEF) IV syringe 2 g
2 | INTRAVENOUS | Status: DC | PRN
Start: 2016-01-26 — End: 2016-01-27

## 2016-01-26 MED ORDER — potassium chloride SA (K-DUR,KLOR-CON) CR tablet 40 mEq
10 | Freq: Once | ORAL | Status: DC
Start: 2016-01-26 — End: 2016-01-26

## 2016-01-26 MED ORDER — nitroglycerin (NITROSTAT) SL tablet 0.4 mg
0.4 | SUBLINGUAL | Status: DC | PRN
Start: 2016-01-26 — End: 2016-01-27

## 2016-01-26 MED ORDER — albuterol (PROVENTIL) nebulizer solution 2.5 mg
2.5 | Freq: Once | RESPIRATORY_TRACT | Status: AC
Start: 2016-01-26 — End: 2016-01-26
  Administered 2016-01-26: 2.5 mg via RESPIRATORY_TRACT

## 2016-01-26 MED ORDER — furosemide (LASIX) tablet 20 mg
20 | ORAL | Status: DC
Start: 2016-01-26 — End: 2016-01-27
  Administered 2016-01-26 – 2016-01-27 (×4): 20 mg via ORAL

## 2016-01-26 MED ORDER — acetaminophen (TYLENOL) tablet 650 mg
325 | ORAL | Status: DC | PRN
Start: 2016-01-26 — End: 2016-01-27

## 2016-01-26 MED ORDER — chlorhexidine (PERIDEX) 0.12 % solution 15 mL
0.12 | Freq: Once | Status: AC
Start: 2016-01-26 — End: 2016-01-27
  Administered 2016-01-27: 16:00:00 0.12 mL via OROMUCOSAL

## 2016-01-26 MED ORDER — magnesium sulfate 1 g in D5W IVPB Premix
1 | Freq: Once | INTRAVENOUS | Status: AC
Start: 2016-01-26 — End: 2016-01-25
  Administered 2016-01-26: 05:00:00 1 g via INTRAVENOUS
  Administered 2016-01-26: 05:00:00 1100 g via INTRAVENOUS

## 2016-01-26 MED ORDER — potassium chloride SA (K-DUR,KLOR-CON) CR tablet 40 mEq
10 | Freq: Once | ORAL | Status: AC
Start: 2016-01-26 — End: 2016-01-25
  Administered 2016-01-26: 05:00:00 10 meq via ORAL

## 2016-01-26 MED FILL — HEPARIN, PORCINE (PF) 5,000 UNIT/0.5 ML SYRINGE: 5000 unit/0.5 mL | INTRAMUSCULAR | Qty: 0.5

## 2016-01-26 MED FILL — METOPROLOL TARTRATE 25 MG TABLET: 25 mg | ORAL | Qty: 1

## 2016-01-26 MED FILL — KLOR-CON M10 MEQ TABLET,EXTENDED RELEASE: 10 meq | ORAL | Qty: 4

## 2016-01-26 MED FILL — ALBUTEROL SULFATE 2.5 MG/3 ML (0.083 %) SOLUTION FOR NEBULIZATION: 2.5 mg/3 mL (0.083 %) | RESPIRATORY_TRACT | Qty: 3

## 2016-01-26 MED FILL — MUPIROCIN 2 % TOPICAL OINTMENT: 2 % | TOPICAL | Qty: 22

## 2016-01-26 MED FILL — FUROSEMIDE 20 MG TABLET: 20 mg | ORAL | Qty: 1

## 2016-01-26 MED FILL — FAMOTIDINE 20 MG TABLET: 20 mg | ORAL | Qty: 1

## 2016-01-26 MED FILL — ASPIRIN 81 MG CHEWABLE TABLET: 81 mg | ORAL | Qty: 1

## 2016-01-26 MED FILL — MAGNESIUM SULFATE 1 GRAM/100 ML IN DEXTROSE 5 % INTRAVENOUS PIGGYBACK: 1 gram/100 mL | INTRAVENOUS | Qty: 100

## 2016-01-26 NOTE — Progress Notes (Signed)
Spirometry pre/post bronchial dilator, DLCO  Performed. Preliminary report placed in chart and sent to [facility] to be scanned into EPIC. Patient had cough and fluctuated heart rate to 136 bpm. RA ABG not obtained at this time and forwarded to Respiratory Care Dept. To draw.

## 2016-01-26 NOTE — Progress Notes (Signed)
Cardiothoracic surgery update    Plan for surgery tomorrow for aortic valve replacement  Blood orders in  Preop orders in  will talk to patient later this afternoon    Otho Bellows MD  Cardiothoracic Surgery

## 2016-01-26 NOTE — Progress Notes (Signed)
Spirometry pre/post bronchial dilator, DLCO  Performed. Preliminary report placed in chart and sent to [facility] to be scanned into EPIC. Patient had cough and fluctuated heart rate to 136 bpm. RA ABG not obtained at this time and forwarded to Respiratory Care Dept. To draw.

## 2016-01-26 NOTE — Progress Notes (Signed)
End of Shift Report     Significant Events See progress note     Relevant   Assessment Findings Dyspnea at rest and with exertion.   2-3+ pitting edema BLE  ST 110-130s with PVC's  R groin SFD from cath at [facility]  Questions/Concerns None      DC Barriers AVR Thursday     Hourly Rounding  ??  The health care team including RN, CNAll rounded per policy including:   ? safety checks  ? patient satisfaction  ??  This ensured patient expectations for excellent care, appropriate goals were set and assessed then updated.  ??  Purposeful Rounding included: bedside RN handoff report, medication pass and education, pain assessment, potty check, position/comfort check. General Safety of patient/room/bed/rest room/environmental. Plan of Care to include: labs,tests,discharge plan and follow up.    RELATE -- Reassure, Explain, Listen, Answer, Take Action, Express appreciation.

## 2016-01-26 NOTE — Plan of Care (Signed)
End of Shift Report     Significant Events During PFT today pt had 13bts Vtach 167-188, labs for potassium and magnesium redrawn. K+ 4.3, Mag 1.7 - orders for 2gm mag sulfate IV by PA.     Relevant   Assessment Findings BP post metoprolol given was 106/70 (last was 96/70).  HR continues to be ST 110-130s. Pt tachypneic - 30-40s, but does not appear in distress, pt states this is how she has been. RT gave treatment this morning and pt stated feeling better, however, she was still tachypneic post treatment. MD aware.      Patient/Family  Questions/Concerns Pt anxious to get procedure done - keeps saying she will do whatever it takes heal up and leave as soon as possible. Pt requesting shower tonight prior to procedure.      DC Barriers Scheduled for AVR tomorrow 8/17 @ 1000 - consent signed and in chart. Pt to be NPO after midnight.

## 2016-01-26 NOTE — Progress Notes (Signed)
RESPIRATORY ASSESSMENT    SUBJECTIVE: States she feels like she is breathing a little labored but is not SOB.        OBJECTIVE:   Current Vital Signs:    Pre and post C/S: Fine Crackles Posterior t/o, Clear Diminished anterior t/o   HR:  121   RR:  14              Cough: Productive, Strong, scant green/clear thick.    SpO2:  97% on RA   CXR: FINDINGS:  The cardiac silhouette and mediastinal contours are unchanged compared to prior. There are bibasilar opacities, unchanged since 01/23/2016. Small bilateral pleural effusions are not excluded. The regional soft tissues and osseous structures are unchanged.  ?IMPRESSION:  Enlargement of the cardiac silhouette with mild pulmonary edema.  ?Bibasilar atelectasis and/or pneumonia, unchanged since 01/23/2016.    ASSESSMENT:  Pt is sitting in chair, responds appropriately, no current respiratory distressed noticed at this time though she says she feels a little labored, no chronic respiratory disease history, no home respiratory meds nor home O2, has never smoked, already had IS at bedside and displays appropriate technique.        PLAN: No bronchodilators at this time. Assess as needed.        GOAL(S):  Supplemental Oxygen:   Maintain SpO2 > 92%  Normalize and maintain an adequate SpO2/PaO2 appropriate for patient clinical situation    Per PDP # 102 and MD orders    E.S.  Carolynn Sayers, RRT

## 2016-01-27 ENCOUNTER — Inpatient Hospital Stay: Admit: 2016-01-27 | Payer: MEDICAID

## 2016-01-27 LAB — ABG
A-a Gradient: 323.7 mmHg
A-a Gradient: 72.1 mmHg
Base Excess: -4.8 mmol/L — ABNORMAL LOW (ref ?–2.0)
Base Excess: -4.9 mmol/L — ABNORMAL LOW (ref ?–2.0)
Carboxyhemoglobin: 0.6 % (ref 0.0–1.5)
Carboxyhemoglobin: 1.1 % (ref 0.0–1.5)
FIO2: 100
FIO2: 30
HCO3 Arterial: 22.6 mmol/L (ref 22.0–26.0)
HCO3 Arterial: 19.5 mmol/L — ABNORMAL LOW (ref 22.0–26.0)
Hemoglobin: 10.5 g/dL — ABNORMAL LOW (ref 12.0–16.0)
Hemoglobin: 10.9 g/dL — ABNORMAL LOW (ref 12.0–16.0)
Inspiratory Time: 0.7
Methemoglobin: 0.3 % (ref 0.0–1.0)
Methemoglobin: 0.2 % (ref 0.0–1.0)
Minute Ventilation: 11
Minute Ventilation: 14
O2Hb: 98.6 % — ABNORMAL HIGH (ref 94.0–97.0)
O2Hb: 95.3 % (ref 94.0–97.0)
PSV: 12
Patient Temperature: 37 C
Patient Temperature: 37 C
Peak Inspiratory Pressure: 25 cmH2O
Peep: 5 cmH2O
Peep: 5 cmH2O
Puncture Attempts: 1
Respiratory Rate: 22 b/min
Respiratory Rate: 30 b/min
Tidal Volume: 525 mL
Tidal Volume: 500 mL
Time Analyzed: 20170817150253
Time Analyzed: 20170817160404
a/A Ratio: 0.48
a/A Ratio: 0.56
pCO2 Arterial: 52.6 mmHg — ABNORMAL HIGH (ref 35.0–45.0)
pCO2 Arterial: 34 mmHg — ABNORMAL LOW (ref 35.0–45.0)
pH Arterial: 7.251 — ABNORMAL LOW (ref 7.350–7.450)
pH Arterial: 7.377 (ref 7.350–7.450)
pO2 Arterial: 303.7 mmHg — ABNORMAL HIGH (ref 80.0–100.0)
pO2 Arterial: 92 mmHg (ref 80.0–100.0)
sO2 Arterial: 99.5 % — ABNORMAL HIGH (ref 92.0–98.5)
sO2 Arterial: 96.6 % (ref 92.0–98.5)

## 2016-01-27 LAB — PT & PTT
APTT: 28 Seconds (ref 23–36)
INR: 1.7 — ABNORMAL HIGH (ref 0.9–1.2)
Protime: 18.7 Seconds — ABNORMAL HIGH (ref 10.0–13.0)

## 2016-01-27 LAB — BASIC METABOLIC PANEL
Anion Gap: 10.2 mmol/L (ref 3.0–11.0)
Anion Gap: 9.6 mmol/L (ref 3.0–11.0)
BUN: 19 mg/dL (ref 6–23)
BUN: 21 mg/dL (ref 6–23)
CO2 - Carbon Dioxide: 22.4 mmol/L (ref 21.0–31.0)
CO2 - Carbon Dioxide: 22.8 mmol/L (ref 21.0–31.0)
Calcium: 10.4 mg/dL — ABNORMAL HIGH (ref 8.6–10.3)
Calcium: 9.7 mg/dL (ref 8.6–10.3)
Chloride: 102 mmol/L (ref 98–111)
Chloride: 104 mmol/L (ref 98–111)
Creatinine: 0.83 mg/dL (ref 0.55–1.10)
Creatinine: 1.01 mg/dL (ref 0.55–1.10)
GFR Estimate: 56 mL/min/{1.73_m2} — ABNORMAL LOW (ref >=60)
GFR Estimate: >60 mL/min/{1.73_m2} (ref >=60)
Glucose: 121 mg/dL — ABNORMAL HIGH (ref 80–99)
Glucose: 151 mg/dL — ABNORMAL HIGH (ref 80–99)
Potassium: 3.4 mmol/L — ABNORMAL LOW (ref 3.5–5.1)
Potassium: 4.3 mmol/L (ref 3.5–5.1)
Sodium: 134 mmol/L — ABNORMAL LOW (ref 135–143)
Sodium: 137 mmol/L (ref 135–143)

## 2016-01-27 LAB — CBC WITH AUTO DIFFERENTIAL
Basophils %: 1 % (ref 0–2)
Basophils, Absolute: 0.1 10*3/ÂµL (ref 0.0–0.2)
Eosinophils %: 0 % (ref 0–7)
Eosinophils, Absolute: 0 10*3/ÂµL (ref 0.0–0.7)
HCT: 30.3 % — ABNORMAL LOW (ref 37.0–48.0)
Hemoglobin: 9.5 g/dL — ABNORMAL LOW (ref 12.0–16.0)
Lymphocytes %: 14 % — ABNORMAL LOW (ref 25–45)
Lymphocytes, Absolute: 1.9 10*3/ÂµL (ref 1.1–4.3)
MCH: 21.8 pg — ABNORMAL LOW (ref 27.0–34.0)
MCHC: 31.2 g/dL — ABNORMAL LOW (ref 32.0–36.0)
MCV: 69.8 fL — ABNORMAL LOW (ref 81.0–99.0)
MPV: 7.8 fL (ref 7.4–10.4)
Monocytes %: 9 % (ref 0–12)
Monocytes, Absolute: 1.3 10*3/ÂµL — ABNORMAL HIGH (ref 0.0–1.2)
Neutrophils %: 76 % — ABNORMAL HIGH (ref 35–70)
Neutrophils, Absolute: 10.5 10*3/ÂµL — ABNORMAL HIGH (ref 1.6–7.3)
Platelet Count: 407 10*3/ÂµL — ABNORMAL HIGH (ref 150–400)
Platelet Estimate: INCREASED — AB
RBC: 4.35 10*6/ÂµL (ref 4.20–5.40)
RDW: 20.7 % — ABNORMAL HIGH (ref 11.5–14.5)
WBC: 13.9 10*3/ÂµL — ABNORMAL HIGH (ref 4.8–10.8)

## 2016-01-27 LAB — CBC WITHOUT DIFFERENTIAL
HCT: 29.5 % — ABNORMAL LOW (ref 37.0–48.0)
Hemoglobin: 9.2 g/dL — ABNORMAL LOW (ref 12.0–16.0)
MCH: 22.6 pg — ABNORMAL LOW (ref 27.0–34.0)
MCHC: 31 g/dL — ABNORMAL LOW (ref 32.0–36.0)
MCV: 72.9 fL — ABNORMAL LOW (ref 81.0–99.0)
MPV: 7.8 fL (ref 7.4–10.4)
Platelet Count: 279 10*3/ÂµL (ref 150–400)
RBC: 4.06 10*6/ÂµL — ABNORMAL LOW (ref 4.20–5.40)
RDW: 21.2 % — ABNORMAL HIGH (ref 11.5–14.5)
WBC: 31.2 10*3/ÂµL — ABNORMAL HIGH (ref 4.8–10.8)

## 2016-01-27 LAB — COMPREHENSIVE METABOLIC PANEL
ALT - Alanine Aminotransferase: 26 IU/L (ref 7–52)
AST - Aspartate Aminotransferase: 28 IU/L (ref 8–39)
Albumin/Globulin Ratio: 1 (ref >0.9)
Albumin: 3.3 g/dL — ABNORMAL LOW (ref 3.5–5.0)
Alkaline Phosphatase: 72 IU/L (ref 34–104)
Anion Gap: 12.3 mmol/L — ABNORMAL HIGH (ref 3.0–11.0)
BUN: 20 mg/dL (ref 6–23)
Bilirubin Total: 0.7 mg/dL (ref 0.3–1.2)
CO2 - Carbon Dioxide: 21.7 mmol/L (ref 21.0–31.0)
Calcium: 10.3 mg/dL (ref 8.6–10.3)
Chloride: 101 mmol/L (ref 98–111)
Creatinine: 0.78 mg/dL (ref 0.55–1.10)
GFR Estimate: >60 mL/min/{1.73_m2} (ref >=60)
Globulin: 3.3 g/dL (ref 2.2–3.7)
Glucose: 132 mg/dL — ABNORMAL HIGH (ref 80–99)
Potassium: 4.3 mmol/L (ref 3.5–5.1)
Protein Total: 6.6 g/dL (ref 6.0–8.0)
Sodium: 135 mmol/L (ref 135–143)

## 2016-01-27 LAB — TEG PLATELET MAP
K Time: 1.1 Minutes (ref 1.0–3.0)
Max Amp (MA): 80.5 mm — ABNORMAL HIGH (ref 50.0–70.0)
R Time: 5.5 Minutes (ref 5.0–10.0)
TEG AA % Inhib: 77 % (ref 0.0–100.0)
TEG ADP, % Inhibition: 48.6 % (ref 0.0–100.0)
TEG Angle: 65 Degrees (ref 53.0–72.0)
TEG Max Amp (ADP): 61.5 mm (ref 50.0–70.0)

## 2016-01-27 LAB — POCT GLUCOSE
POC Glucose: 141 mg/dL — ABNORMAL HIGH (ref 80–99)
POC Glucose: 143 mg/dL — ABNORMAL HIGH (ref 80–99)
POC Glucose: 157 mg/dL — ABNORMAL HIGH (ref 80–99)

## 2016-01-27 LAB — GLYCO-HEMOGLOBIN A1C
Estimated Average Glucose: 126 mg/dL
Glycohemoglobin (A1c): 6 % (ref 4.3–6.1)

## 2016-01-27 LAB — MAGNESIUM
Magnesium: 1.7 mg/dL (ref 1.6–2.4)
Magnesium: 2.1 mg/dL (ref 1.6–2.4)

## 2016-01-27 LAB — TEG PANEL
Angle, Heparin: 73.5 Degrees — ABNORMAL HIGH (ref 53.0–72.0)
Angle, Heparin: 77.2 Degrees — ABNORMAL HIGH (ref 53.0–72.0)
K Time, Heparinized: 0.9 Minutes — ABNORMAL LOW (ref 1.0–3.0)
K Time, Heparinized: 1.5 Minutes (ref 1.0–3.0)
K Time: 0.9 Minutes — ABNORMAL LOW (ref 1.0–3.0)
Max Amp (MA) Hep: 56.9 mm (ref 50.0–70.0)
Max Amp (MA) Hep: 76.7 mm — ABNORMAL HIGH (ref 50.0–70.0)
Max Amp (MA): 76.5 mm — ABNORMAL HIGH (ref 50.0–70.0)
R Time, Heparinized: 3.6 Minutes — ABNORMAL LOW (ref 5.0–10.0)
R Time, Heparinized: 5.4 Minutes (ref 5.0–10.0)
R Time: 3.5 Minutes — ABNORMAL LOW (ref 5.0–10.0)
TEG Angle: 76.7 Degrees — ABNORMAL HIGH (ref 53.0–72.0)

## 2016-01-27 LAB — TISSUE EXAM

## 2016-01-27 LAB — IONIZED CALCIUM: Ionized Calcium: 1.38 mmol/L — ABNORMAL HIGH (ref 1.12–1.30)

## 2016-01-27 MED ORDER — ceFAZolin (ANCEF) 2 g in D5W IVPB Premix
2 | Freq: Three times a day (TID) | INTRAVENOUS | Status: AC
Start: 2016-01-27 — End: 2016-01-28
  Administered 2016-01-28 (×2): 250 g via INTRAVENOUS
  Administered 2016-01-28 (×2): 2 g via INTRAVENOUS

## 2016-01-27 MED ORDER — magnesium sulfate 2 g in Water IVPB Premix
2 | INTRAVENOUS | Status: DC | PRN
Start: 2016-01-27 — End: 2016-02-15
  Administered 2016-01-29: 07:00:00 2 g via INTRAVENOUS
  Administered 2016-01-29: 08:00:00 2504 g via INTRAVENOUS

## 2016-01-27 MED ORDER — mupirocin (BACTROBAN) 2 % ointment
2 | Freq: Two times a day (BID) | TOPICAL | Status: DC
Start: 2016-01-27 — End: 2016-01-27

## 2016-01-27 MED ORDER — heparin (porcine) injection
1000 | INTRAMUSCULAR | Status: DC | PRN
Start: 2016-01-27 — End: 2016-01-27
  Administered 2016-01-27: 19:00:00 via INTRAVENOUS

## 2016-01-27 MED ORDER — midazolam (VERSED) injection
5 | INTRAMUSCULAR | Status: DC | PRN
Start: 2016-01-27 — End: 2016-01-27
  Administered 2016-01-27: 20:00:00 5 mg/mL via INTRAVENOUS

## 2016-01-27 MED ORDER — Normal saline infusion for medication/blood product administration for nursing
0.9 | INTRAVENOUS | Status: DC | PRN
Start: 2016-01-27 — End: 2016-01-27

## 2016-01-27 MED ORDER — midazolam (PF) (VERSED) 1 mg/mL injection
1 | INTRAMUSCULAR | Status: AC
Start: 2016-01-27 — End: ?

## 2016-01-27 MED ORDER — famotidine (PF) (PEPCID) 20 mg in 0.9% NaCl IVPB Premix
20 | Freq: Two times a day (BID) | INTRAVENOUS | Status: DC
Start: 2016-01-27 — End: 2016-01-29
  Administered 2016-01-27: 2050 mg via INTRAVENOUS
  Administered 2016-01-27: 23:00:00 20 mg via INTRAVENOUS
  Administered 2016-01-28 (×2): 2050 mg via INTRAVENOUS
  Administered 2016-01-28 – 2016-01-29 (×3): 20 mg via INTRAVENOUS
  Administered 2016-01-29: 04:00:00 2050 mg via INTRAVENOUS
  Administered 2016-01-29: 04:00:00 20 mg via INTRAVENOUS
  Administered 2016-01-29: 17:00:00 2050 mg via INTRAVENOUS

## 2016-01-27 MED ORDER — dexmedetomidine (PRECEDEX) 400 mcg in sodium chloride 0.9 % (NS) 100 mL (4 mcg/mL)
100 | INTRAVENOUS | Status: DC
Start: 2016-01-27 — End: 2016-02-13
  Administered 2016-01-28 – 2016-02-05 (×59): via INTRAVENOUS

## 2016-01-27 MED ORDER — epoprostenol (FLOLAN) 1,000 mcg in glycine diluent for epoprostenol/flolan 50 mL continuous nebulization
0.5 | INTRAVENOUS | Status: DC
Start: 2016-01-27 — End: 2016-01-29
  Administered 2016-01-27 – 2016-01-28 (×4): via RESPIRATORY_TRACT

## 2016-01-27 MED ORDER — EPINEPHrine in D5W (ADRENALIN) 2 mg/250 mL (8 mcg/mL) in D5W IV infusion Premix
2 | INTRAVENOUS | Status: AC
Start: 2016-01-27 — End: ?

## 2016-01-27 MED ORDER — sodium chloride 0.9 % (NS) syringe 10 mL
Freq: Three times a day (TID) | INTRAMUSCULAR | Status: DC
Start: 2016-01-27 — End: 2016-01-27
  Administered 2016-01-27 (×2): via INTRAVENOUS

## 2016-01-27 MED ORDER — aminocaproic acid (AMICAR) injection
250 | INTRAVENOUS | Status: DC | PRN
Start: 2016-01-27 — End: 2016-01-27
  Administered 2016-01-27: 19:00:00 250 mg/mL via INTRAVENOUS

## 2016-01-27 MED ORDER — sodium chloride (NS) 0.9% bolus 500 mL
INTRAVENOUS | Status: DC | PRN
Start: 2016-01-27 — End: 2016-02-13
  Administered 2016-01-28 (×2): via INTRAVENOUS

## 2016-01-27 MED ORDER — sodium chloride 0.9 % (NS) syringe 10 mL
INTRAMUSCULAR | Status: DC | PRN
Start: 2016-01-27 — End: 2016-01-27

## 2016-01-27 MED ORDER — aspirin suppository 300 mg
300 | Freq: Every day | RECTAL | Status: DC
Start: 2016-01-27 — End: 2016-01-28
  Administered 2016-01-28: 04:00:00 300 mg via RECTAL

## 2016-01-27 MED ORDER — etomidate (AMIDATE) injection
2 | INTRAVENOUS | Status: DC | PRN
Start: 2016-01-27 — End: 2016-01-27
  Administered 2016-01-27: 18:00:00 2 mg/mL via INTRAVENOUS

## 2016-01-27 MED ORDER — midazolam (PF) 5 mg/mL
5 | INTRAMUSCULAR | Status: DC | PRN
Start: 2016-01-27 — End: 2016-01-27
  Administered 2016-01-27: 17:00:00 5 mg/mL via INTRAVENOUS

## 2016-01-27 MED ORDER — potassium chloride 40 mEq in sterile water 100 mL IVPB premix **FOR CENTRAL LINE USE ONLY**
40 | INTRAVENOUS | Status: DC | PRN
Start: 2016-01-27 — End: 2016-01-29
  Administered 2016-01-28 (×2): 40 meq/h via INTRAVENOUS
  Administered 2016-01-28 – 2016-01-29 (×4): 40100 meq/h via INTRAVENOUS
  Administered 2016-01-29: 07:00:00 40 meq/h via INTRAVENOUS
  Administered 2016-02-01: 09:00:00 40100 meq/h via INTRAVENOUS

## 2016-01-27 MED ORDER — phenylephrine (NEO-SYNEPHRINE) 10 mg in sodium chloride 0.9 % (NS) 250 mL infusion
10 | INTRAVENOUS | Status: DC | PRN
Start: 2016-01-27 — End: 2016-01-27
  Administered 2016-01-27: 22:00:00
  Administered 2016-01-27 (×8): via INTRAVENOUS

## 2016-01-27 MED ORDER — potassium chloride 40 mEq in sterile water 100 mL IVPB premix **FOR CENTRAL LINE USE ONLY**
40 | INTRAVENOUS | Status: DC
Start: 2016-01-27 — End: 2016-01-29

## 2016-01-27 MED ORDER — sodium chloride 0.9 % (NS) syringe 10 mL
Freq: Three times a day (TID) | INTRAMUSCULAR | Status: DC
Start: 2016-01-27 — End: 2016-02-15
  Administered 2016-01-27 – 2016-02-15 (×39): via INTRAVENOUS

## 2016-01-27 MED ORDER — chlorhexidine (PERIDEX) 0.12 % solution 15 mL
0.12 | Freq: Two times a day (BID) | Status: DC
Start: 2016-01-27 — End: 2016-01-28
  Administered 2016-01-27 – 2016-01-28 (×3): 0.12 mL via OROMUCOSAL

## 2016-01-27 MED ORDER — protamine injection 50 mg
10 | INTRAVENOUS | Status: DC | PRN
Start: 2016-01-27 — End: 2016-02-08

## 2016-01-27 MED ORDER — calcium gluconate in D5W IVPB 1 g
1 | INTRAVENOUS | Status: DC | PRN
Start: 2016-01-27 — End: 2016-02-15
  Administered 2016-02-08: 04:00:00 150 g via INTRAVENOUS
  Administered 2016-02-08: 02:00:00 1 g via INTRAVENOUS

## 2016-01-27 MED ORDER — heparin injection
10000 | INTRAMUSCULAR | Status: DC | PRN
Start: 2016-01-27 — End: 2016-01-27
  Administered 2016-01-27: 19:00:00

## 2016-01-27 MED ORDER — oxyCODONE (ROXICODONE) tablet 5-10 mg
5 | ORAL | Status: DC | PRN
Start: 2016-01-27 — End: 2016-02-15

## 2016-01-27 MED ORDER — insulin regular (HumuLIN-R,NovoLIN) 150 Units in sodium chloride 0.9 % (NS) 150 mL (1 Units/mL) infusion - Critical care
100 | INTRAVENOUS | Status: DC | PRN
Start: 2016-01-27 — End: 2016-01-27

## 2016-01-27 MED ORDER — EPINEPHrine (ADRENALIN) 2 mg in D5W 250 mL (8 mcg/mL) premix infusion
2 | INTRAVENOUS | Status: DC | PRN
Start: 2016-01-27 — End: 2016-02-13
  Administered 2016-01-27: 22508 ug/min via INTRAVENOUS
  Administered 2016-01-27: 22:00:00 2 ug/min via INTRAVENOUS
  Administered 2016-01-28: 07:00:00 22508 ug/min via INTRAVENOUS
  Administered 2016-01-28: 14:00:00 2 ug/min via INTRAVENOUS
  Administered 2016-01-28: 04:00:00 22508 ug/min via INTRAVENOUS
  Administered 2016-01-28 (×3): 2 ug/min via INTRAVENOUS
  Administered 2016-01-28 – 2016-01-29 (×6): 22508 ug/min via INTRAVENOUS
  Administered 2016-01-29: 11:00:00 2 ug/min via INTRAVENOUS
  Administered 2016-01-29: 15:00:00 22508 ug/min via INTRAVENOUS
  Administered 2016-01-29: 03:00:00 2 ug/min via INTRAVENOUS
  Administered 2016-01-29 (×5): 22508 ug/min via INTRAVENOUS
  Administered 2016-01-29: 21:00:00 2 ug/min via INTRAVENOUS
  Administered 2016-01-30 (×5): 22508 ug/min via INTRAVENOUS

## 2016-01-27 MED ORDER — morphine injection 2-8 mg
2 | INTRAVENOUS | Status: DC | PRN
Start: 2016-01-27 — End: 2016-02-15
  Administered 2016-02-03 – 2016-02-07 (×11): 2 mg via INTRAVENOUS

## 2016-01-27 MED ORDER — succinylcholine (ANECTINE) syringe
200 | INTRAVENOUS | Status: DC | PRN
Start: 2016-01-27 — End: 2016-01-27
  Administered 2016-01-27: 18:00:00 200 mg/10 mL (20 mg/mL) via INTRAVENOUS

## 2016-01-27 MED ORDER — sodium chloride 0.9 % (NS) syringe 10 mL
INTRAMUSCULAR | Status: DC | PRN
Start: 2016-01-27 — End: 2016-02-13
  Administered 2016-02-08: via INTRAVENOUS

## 2016-01-27 MED ORDER — ceFAZolin sodium (ANCEF) 2 gram/20 mL syringe
2 | INTRAVENOUS | Status: AC
Start: 2016-01-27 — End: ?

## 2016-01-27 MED ORDER — sodium chloride 0.9 %
INTRAVENOUS | Status: DC | PRN
Start: 2016-01-27 — End: 2016-01-27
  Administered 2016-01-27: 18:00:00 via INTRAVENOUS
  Administered 2016-01-27: 22:00:00

## 2016-01-27 MED ORDER — HYDROcodone-acetaminophen (NORCO) 5-325 mg per tablet 1-2 tablet
5-325 | ORAL | Status: DC | PRN
Start: 2016-01-27 — End: 2016-02-15

## 2016-01-27 MED ORDER — phenylephrine (NEO-SYNEPHRINE) 20 mg in dextrose 5 % (D5W) 500 mL (0.04 mg/mL) infusion
10 | INTRAMUSCULAR | Status: DC | PRN
Start: 2016-01-27 — End: 2016-01-29
  Administered 2016-01-27 – 2016-01-29 (×16): via INTRAVENOUS

## 2016-01-27 MED ORDER — fentanyl (PF) 1250 mcg/250 mL infusion 5 mcg/mL
1250 | INTRAVENOUS | Status: AC
Start: 2016-01-27 — End: 2016-01-27
  Administered 2016-01-27: 22:00:00 1250 mcg/250 mL (5 mcg/mL) via INTRAVENOUS

## 2016-01-27 MED ORDER — fentaNYL (PF) (SUBLIMAZE) 50 mcg/mL injection
50 | INTRAMUSCULAR | Status: AC
Start: 2016-01-27 — End: ?

## 2016-01-27 MED ORDER — rocuronium (ZEMURON) injection
10 | INTRAVENOUS | Status: DC | PRN
Start: 2016-01-27 — End: 2016-01-27
  Administered 2016-01-27: 18:00:00 10 mg/mL via INTRAVENOUS

## 2016-01-27 MED ORDER — nitroglycerin 50 mg in D5W 250 mL (200 mcg/mL) premix infusion
50 | INTRAVENOUS | Status: DC | PRN
Start: 2016-01-27 — End: 2016-02-13

## 2016-01-27 MED ORDER — propofol (DIPRIVAN) injection
10 | INTRAVENOUS | Status: DC | PRN
Start: 2016-01-27 — End: 2016-01-27
  Administered 2016-01-27 (×2): 10 mg/mL via INTRAVENOUS

## 2016-01-27 MED ORDER — clevidipine (CLEVIPREX) 25 mg in 50 mL (0.5 mg/mL) premix infusion
25 | INTRAVENOUS | Status: DC | PRN
Start: 2016-01-27 — End: 2016-02-13
  Administered 2016-02-02: 15:00:00 2550 mg/h via INTRAVENOUS

## 2016-01-27 MED ORDER — potassium chloride 40 mEq/100 mL IVPB premix
40 | INTRAVENOUS | Status: AC
Start: 2016-01-27 — End: 2016-01-27
  Administered 2016-01-27: 22:00:00 40 mEq/100 mL via INTRAVENOUS
  Administered 2016-01-28: 01:00:00 40100 mEq/100 mL via INTRAVENOUS

## 2016-01-27 MED ORDER — propofol (DIPRIVAN) infusion 10 mg/mL
10 | INTRAVENOUS | Status: DC | PRN
Start: 2016-01-27 — End: 2016-01-29
  Administered 2016-01-27 – 2016-01-28 (×5): 10 ug/kg/min via INTRAVENOUS

## 2016-01-27 MED ORDER — vasopressin (VASOSTRICT) 50 Units in sodium chloride 0.9 % (NS) 250 mL (0.2 Units/mL) infusion
20 | INTRAVENOUS | Status: DC
Start: 2016-01-27 — End: 2016-02-13
  Administered 2016-01-27 – 2016-01-29 (×6): via INTRAVENOUS

## 2016-01-27 MED ORDER — protamine injection
10 | INTRAVENOUS | Status: DC | PRN
Start: 2016-01-27 — End: 2016-01-27
  Administered 2016-01-27: 20:00:00 10 mg/mL via INTRAVENOUS

## 2016-01-27 MED ORDER — acetaminophen (TYLENOL) tablet 650 mg
325 | ORAL | Status: DC | PRN
Start: 2016-01-27 — End: 2016-02-15
  Administered 2016-01-28: 19:00:00 325 mg via ORAL

## 2016-01-27 MED ORDER — hum prothrombin cplx(PCC)4fact (KCENTRA) infusion 5,310 Units
1000 | Freq: Once | INTRAVENOUS | Status: AC
Start: 2016-01-27 — End: 2016-01-27
  Administered 2016-01-27: 21:00:00 via INTRAVENOUS

## 2016-01-27 MED ORDER — insulin regular (HumuLIN-R,NovoLIN) 150 Units in sodium chloride 0.9 % (NS) 150 mL (1 Units/mL) infusion - Cardiac surgery
100 | INTRAVENOUS | Status: DC | PRN
Start: 2016-01-27 — End: 2016-02-05
  Administered 2016-01-27 – 2016-02-05 (×44): via INTRAVENOUS

## 2016-01-27 MED ORDER — bacitracin injection
50000 | INTRAMUSCULAR | Status: DC | PRN
Start: 2016-01-27 — End: 2016-01-27
  Administered 2016-01-27: 19:00:00

## 2016-01-27 MED ORDER — magnesium sulfate 2 g in Water IVPB Premix
2 | Freq: Once | INTRAVENOUS | Status: AC
Start: 2016-01-27 — End: 2016-01-26
  Administered 2016-01-27: 02:00:00 2504 g via INTRAVENOUS
  Administered 2016-01-27: 01:00:00 2 g via INTRAVENOUS

## 2016-01-27 MED ORDER — EPINEPHrine 2 mg in D5W 250 mL (8 mcg/mL) infusion
1 | INTRAVENOUS | Status: DC | PRN
Start: 2016-01-27 — End: 2016-01-27
  Administered 2016-01-27: 22:00:00
  Administered 2016-01-27: 20:00:00 via INTRAVENOUS

## 2016-01-27 MED ORDER — magnesium sulfate 1 g in D5W IVPB Premix
1 | Freq: Once | INTRAVENOUS | Status: DC
Start: 2016-01-27 — End: 2016-01-26

## 2016-01-27 MED ORDER — DOBUTamine (DOBUTREX) 250 mg in dextrose 5 % (D5W) 250 mL (1 mg/mL) infusion
250 | INTRAVENOUS | Status: DC | PRN
Start: 2016-01-27 — End: 2016-02-13

## 2016-01-27 MED ORDER — sodium chloride (NS) 0.9% irrigation
0.9 | Status: DC | PRN
Start: 2016-01-27 — End: 2016-01-27
  Administered 2016-01-27: 19:00:00 0.9 % irrigation

## 2016-01-27 MED ORDER — phenylephrine (NEO-SYNEPHRINE) 10 mg/mL injection
10 | INTRAMUSCULAR | Status: AC
Start: 2016-01-27 — End: 2016-02-09

## 2016-01-27 MED ORDER — milrinone (PRIMACOR) infusion 200 mcg/mL
200 | INTRAVENOUS | Status: DC | PRN
Start: 2016-01-27 — End: 2016-02-15
  Administered 2016-01-27 – 2016-02-02 (×25): 200 ug/kg/min via INTRAVENOUS

## 2016-01-27 MED ORDER — fentaNYL (PF) (SUBLIMAZE) 50 mcg/mL injection
50 | INTRAMUSCULAR | Status: DC | PRN
Start: 2016-01-27 — End: 2016-01-27
  Administered 2016-01-27 (×2): 50 mcg/mL via INTRAVENOUS

## 2016-01-27 MED ORDER — electrolyte-S (ISOLYTE-S) infusion
INTRAVENOUS | Status: DC | PRN
Start: 2016-01-27 — End: 2016-01-27
  Administered 2016-01-27: 18:00:00 via INTRAVENOUS
  Administered 2016-01-27: 22:00:00

## 2016-01-27 MED ORDER — magnesium sulfate 1 g in D5W IVPB Premix
1 | INTRAVENOUS | Status: DC | PRN
Start: 2016-01-27 — End: 2016-02-15
  Administered 2016-01-28 (×2): 1100 g via INTRAVENOUS
  Administered 2016-01-28 – 2016-02-01 (×3): 1 g via INTRAVENOUS
  Administered 2016-02-01: 20:00:00 1100 g via INTRAVENOUS
  Administered 2016-02-02: 17:00:00 1 g via INTRAVENOUS
  Administered 2016-02-02 – 2016-02-03 (×2): 1100 g via INTRAVENOUS
  Administered 2016-02-03 – 2016-02-04 (×2): 1 g via INTRAVENOUS
  Administered 2016-02-04: 06:00:00 1100 g via INTRAVENOUS
  Administered 2016-02-06: 04:00:00 1 g via INTRAVENOUS
  Administered 2016-02-06 (×2): 1100 g via INTRAVENOUS
  Administered 2016-02-06: 13:00:00 1 g via INTRAVENOUS
  Administered 2016-02-08: 05:00:00 1100 g via INTRAVENOUS
  Administered 2016-02-08 – 2016-02-09 (×2): 1 g via INTRAVENOUS
  Administered 2016-02-09 (×2): 1100 g via INTRAVENOUS
  Administered 2016-02-09 (×2): 1 g via INTRAVENOUS
  Administered 2016-02-10: 1100 g via INTRAVENOUS

## 2016-01-27 MED ORDER — albumin, human 5 % infusion 500 mL
5 | INTRAVENOUS | Status: AC | PRN
Start: 2016-01-27 — End: 2016-01-28
  Administered 2016-01-28 – 2016-01-29 (×5): 5 mL via INTRAVENOUS

## 2016-01-27 MED ORDER — meperidine (PF) (DEMEROL) syringe 25 mg
50 | INTRAMUSCULAR | Status: DC | PRN
Start: 2016-01-27 — End: 2016-02-08

## 2016-01-27 MED ORDER — vancomycin (VANCOCIN) injection
1000 | INTRAVENOUS | Status: DC | PRN
Start: 2016-01-27 — End: 2016-01-27
  Administered 2016-01-27: 19:00:00 via TOPICAL

## 2016-01-27 MED ORDER — acetaminophen (TYLENOL) suppository 650 mg
650 | RECTAL | Status: DC | PRN
Start: 2016-01-27 — End: 2016-02-13
  Administered 2016-01-28 – 2016-01-29 (×2): 650 mg via RECTAL

## 2016-01-27 MED ORDER — midazolam (PF) 5 mg/mL
5 | INTRAMUSCULAR | Status: AC
Start: 2016-01-27 — End: ?

## 2016-01-27 MED ORDER — fentaNYL (PF) 1250 mcg in NaCl 0.9 % 250 mL(5 mcg/mL) premix infusion
1250 | INTRAVENOUS | Status: DC | PRN
Start: 2016-01-27 — End: 2016-02-08
  Administered 2016-01-28: 15:00:00 12502505 ug/h via INTRAVENOUS
  Administered 2016-01-28: 21:00:00 1250 ug/h via INTRAVENOUS
  Administered 2016-01-29: 15:00:00 12502505 ug/h via INTRAVENOUS
  Administered 2016-01-29 – 2016-01-30 (×3): 1250 ug/h via INTRAVENOUS
  Administered 2016-01-31 – 2016-02-03 (×6): 12502505 ug/h via INTRAVENOUS
  Administered 2016-02-03: 17:00:00 1250 ug/h via INTRAVENOUS
  Administered 2016-02-03 – 2016-02-04 (×4): 12502505 ug/h via INTRAVENOUS
  Administered 2016-02-04: 07:00:00 1250 ug/h via INTRAVENOUS
  Administered 2016-02-04 – 2016-02-05 (×2): 12502505 ug/h via INTRAVENOUS
  Administered 2016-02-05: 01:00:00 1250 ug/h via INTRAVENOUS
  Administered 2016-02-05 (×3): 12502505 ug/h via INTRAVENOUS

## 2016-01-27 MED ORDER — dextrose 5 % in lactated ringers infusion
INTRAVENOUS | Status: DC | PRN
Start: 2016-01-27 — End: 2016-02-13
  Administered 2016-01-27 – 2016-01-30 (×5): via INTRAVENOUS

## 2016-01-27 MED ORDER — milrinone (PRIMACOR) infusion 200 mcg/mL
200 | INTRAVENOUS | Status: DC | PRN
Start: 2016-01-27 — End: 2016-01-27
  Administered 2016-01-27: 22:00:00 200 mcg/mL
  Administered 2016-01-27: 20:00:00 200 mcg/mL via INTRAVENOUS

## 2016-01-27 MED ORDER — electrolyte-S (ISOLYTE-S) infusion
INTRAVENOUS | Status: DC | PRN
Start: 2016-01-27 — End: 2016-01-27
  Administered 2016-01-27: 22:00:00
  Administered 2016-01-27: 18:00:00 via INTRAVENOUS

## 2016-01-27 MED ORDER — ceFAZolin (ANCEF) in SW Premix Syringe
1 | INTRAVENOUS | Status: DC | PRN
Start: 2016-01-27 — End: 2016-01-27
  Administered 2016-01-27: 18:00:00 1 gram/0 mL via INTRAVENOUS

## 2016-01-27 MED ORDER — hydrALAZINE (APRESOLINE) injection 10 mg
20 | INTRAMUSCULAR | Status: DC | PRN
Start: 2016-01-27 — End: 2016-02-15

## 2016-01-27 MED FILL — METOPROLOL TARTRATE 25 MG TABLET: 25 mg | ORAL | Qty: 1

## 2016-01-27 MED FILL — MIDAZOLAM (PF) 1 MG/ML INJECTION SOLUTION: 1 mg/mL | INTRAMUSCULAR | Qty: 2

## 2016-01-27 MED FILL — MIDAZOLAM (PF) 5 MG/ML INJECTION SOLUTION: 5 mg/mL | INTRAMUSCULAR | Qty: 1

## 2016-01-27 MED FILL — KCENTRA 1,000 UNIT (800-1,240 UNIT) INTRAVENOUS SOLUTION: 1000 unit (800-1240 unit) | INTRAVENOUS | Qty: 212.4

## 2016-01-27 MED FILL — PHENYLEPHRINE 10 MG/ML INJECTION SOLUTION: 10 mg/mL | INTRAMUSCULAR | Qty: 2

## 2016-01-27 MED FILL — FAMOTIDINE 20 MG TABLET: 20 mg | ORAL | Qty: 1

## 2016-01-27 MED FILL — FENTANYL (PF) IN 0.9 % NACL 1250 MCG/250 ML PREMIX INFUSION: 1250 mcg/250 mL (5 mcg/mL) | INTRAVENOUS | Qty: 250

## 2016-01-27 MED FILL — NOVOLIN R REGULAR U-100 INSULIN 100 UNIT/ML INJECTION SOLUTION: 100 [IU]/mL | INTRAMUSCULAR | Qty: 1.5

## 2016-01-27 MED FILL — FENTANYL (PF) 50 MCG/ML INJECTION SOLUTION: 50 ug/mL | INTRAMUSCULAR | Qty: 20

## 2016-01-27 MED FILL — FUROSEMIDE 20 MG TABLET: 20 mg | ORAL | Qty: 1

## 2016-01-27 MED FILL — HEPARIN, PORCINE (PF) 5,000 UNIT/0.5 ML SYRINGE: 5000 unit/0.5 mL | INTRAMUSCULAR | Qty: 0.5

## 2016-01-27 MED FILL — FAMOTIDINE (PF) IN 0.9% NACL 20 MG/50 ML IVPB PREMIX: 20 mg/50 mL | INTRAVENOUS | Qty: 50

## 2016-01-27 MED FILL — TEMAZEPAM 15 MG CAPSULE: 15 mg | ORAL | Qty: 1

## 2016-01-27 MED FILL — VASOSTRICT 20 UNIT/ML INTRAVENOUS SOLUTION: 20 [IU]/mL | INTRAVENOUS | Qty: 2.5

## 2016-01-27 MED FILL — FLOLAN 0.5 MG INTRAVENOUS SOLUTION: 0.5 mg | INTRAVENOUS | Qty: 1000

## 2016-01-27 MED FILL — PROPOFOL 10 MG/ML INTRAVENOUS EMULSION: 10 mg/mL | INTRAVENOUS | Qty: 100

## 2016-01-27 MED FILL — EPINEPHRINE IN D5W 2 MG/250 ML (8 MCG/ML) PREMIX INFUSION: 2 mg/250 mL (8 mcg/mL) | INTRAVENOUS | Qty: 250

## 2016-01-27 MED FILL — CHLORHEXIDINE GLUCONATE 0.12 % MOUTHWASH: 0.12 % | Qty: 15

## 2016-01-27 MED FILL — MAGNESIUM SULFATE 2 GRAM/50 ML (4 %) IN WATER INTRAVENOUS PIGGYBACK: 2 gram/50 mL (4 %) | INTRAVENOUS | Qty: 50

## 2016-01-27 MED FILL — CEFAZOLIN 2 GRAM/20 ML IN STERILE WATER INTRAVENOUS SYRINGE: 2 gram/20 mL | INTRAVENOUS | Qty: 20

## 2016-01-27 MED FILL — POTASSIUM CHLORIDE 40 MEQ/100 ML IN STERILE WATER INTRAVENOUS PIGGYBACK: 40 meq/dL | INTRAVENOUS | Qty: 100

## 2016-01-27 MED FILL — MILRINONE 20 MG/100 ML(200 MCG/ML) IN 5 % DEXTROSE INTRAVENOUS PIGGYBK: 200 ug/mL | INTRAVENOUS | Qty: 100

## 2016-01-27 NOTE — Anesthesia Procedure Notes (Signed)
Date:  01/27/2016     Time:  12:06  Location:  OR  Exam  Indication:  Intraoperative evaluation and monitoring  Intubated: Intubated     Sedated:  Sedated  Insertion:  Easy  Probe type: Multiplane  Modality:  2D, CFM, PWD and CWD       Codes  CPT Code:  16109 2D TEE, including probe placement, image acquisition, interpretation and report       Ascending Aorta  Dissection: No       Aortic Arch  Dissection:  No       Descending Aorta  Dissection:  No       Aortic Annulus:  Calcified  Stenosis  Stenosis:  Severe  Comments:  AVA max .41 cm     Regurgitation  Regurgitation:  Mild       Leaflet  Leaflet Morphology:  Bicuspid     NCC:  Restricted     RCC:  Restricted     LCC:  Restricted       Mitral Annulus:  Normal  Stenosis  Stenosis:  None          Regurgitation  Regurgitation:  Mild          Leaflet  Leaflet Morphology:  Normal     Leaflet Motion:  VALVES MITRAL LEAFLET MOTION       Tricuspid  Tricuspid Annulus:  Normal     Stenosis:  None     Regurgitation:  Trace  Leaflet Morphology:  Normal     Leaflet Motion:  Normal     Right Atrium  SEC (Smoke):  No     Thrombus:  No  Tumor:  No     Device:  No       Left Atrium  SEC (Smoke):  No     Thrombus:  No  Tumor:  No     Device:  No       Interatrial Septum    Right Ventricle  Hypertrophy: Yes  Comments:   Thrombus:  No          Left Ventricle  Hypertrophy: Yes  Comments: Thrombus:  No     Global Function:  Severely diminished     Ejection Fraction:  15%     Regional Function  Basal        Mid        Apex

## 2016-01-27 NOTE — Procedures (Signed)
DATE OF PROCEDURE:  01/26/2016    Amended 05-18-16 to correct document type.    PULMONARY FUNCTION TEST    Spirometry reveals an FEV1 of 1.3 liters (47% predicted), which is reduced.    FEV1/FVC ratio is 85% which is within normal limits, indicating no obstructive   defect.    CONCLUSION:  Spirometry does not reveal obstructive defect.  Spirometry suggests  a restrictive defect, although no lung volumes available to confirm.  Please   obtain a complete pulmonary function test if there is a clinical concern.    Alto Denver, MD    AM/rt  D:  01/27/2016  10:20 P   T:  01/28/2016  2:13 A   TID:  [id number]  RECEIPT:    [id number]

## 2016-01-27 NOTE — OR Nursing (Signed)
Right radial art ine inserted with dampened waveform.  Introducer inserted in right IJ, swan floated to 57 cm.  Pt tol all procedures well

## 2016-01-27 NOTE — Anesthesia Procedure Notes (Signed)
Arterial Line Insertion   Performed by: Cheyanne Lamison EDWARDS  Authorized by: Annessa Satre EDWARDS   Consent: Verbal consent obtained.  Risks and benefits: risks, benefits and alternatives were discussed  Consent given by: patient  Patient understanding: patient states understanding of the procedure being performed  Required items: required blood products, implants, devices, and special equipment available  Patient identity confirmed: verbally with patient and arm band  Time out: Immediately prior to procedure a time out was called to verify the correct patient, procedure, equipment, support staff and site/side marked as required.  Preparation: Patient was prepped and draped in the usual sterile fashion (Chloraprep).  Indications: hemodynamic monitoring  Location: right radial  Anesthesia: local infiltration    Anesthesia:  Anesthesia: local infiltration  Local Anesthetic: lidocaine 1% without epinephrine   Sedation:  Patient sedated: yes  Vitals: Vital signs were monitored during sedation.    Allen's test normal: yes  Needle gauge: 20  Seldinger technique: Seldinger technique used  Number of attempts: 1  Post-procedure: dressing applied  Patient tolerance: Patient tolerated the procedure well with no immediate complications

## 2016-01-27 NOTE — Anesthesia Pre-Procedure Evaluation (Addendum)
Anesthesia Evaluation     Patient summary reviewed and Nursing notes reviewed    No history of anesthetic complications   Airway   Mallampati: III  TM distance: 6.5 - 8 cm  Neck ROM: full  Dental    (+) poor dentition    Pulmonary - normal exam   (+) shortness of breath,   Cardiovascular - normal exam  (+) valvular problems/murmurs AS, past MI, dysrhythmias, DOE,     Rhythm: regular  Rate: normal    Neuro/Psych - negative ROS     GI/Hepatic/Renal - negative ROS     Endo/Other - negative ROS    Musculoskeletal - negative ROS                   Anesthesia Plan    ASA 4     general   (A line, central line, pa catheter and TEE)  intravenous induction   Anesthetic Plan, Alternatives, Risks and Questions discussed with patient.

## 2016-01-27 NOTE — Anesthesia Procedure Notes (Signed)
US guided cordis and PA catheter insertion  Performed by: Elijah Phommachanh EDWARDS  Authorized by: Labresha Mellor EDWARDS   Consent: Verbal consent obtained.  Risks and benefits: risks, benefits and alternatives were discussed  Consent given by: patient  Patient understanding: patient states understanding of the procedure being performed  Required items: required blood products, implants, devices, and special equipment available  Patient identity confirmed: verbally with patient and arm band  Time out: Immediately prior to procedure a time out was called to verify the correct patient, procedure, equipment, support staff and site/side marked as required.  Indications: vascular access and central pressure monitoring  Anesthesia: local infiltration    Anesthesia:  Anesthesia: local infiltration  Local Anesthetic: lidocaine 1% without epinephrine   Sedation:  Patient sedated: yes  Sedatives: midazolam  Vitals: Vital signs were monitored during sedation.    Preparation: skin prepped with 2% chlorhexidine  Skin prep agent dried: skin prep agent completely dried prior to procedure  Sterile barriers: all five maximum sterile barriers used - cap, mask, sterile gown, sterile gloves, and large sterile sheet  Hand hygiene: hand hygiene performed prior to central venous catheter insertion  Location details: right internal jugular  Patient position: Trendelenburg  Catheter type: Cordis and PA Catheter  Catheter size: 9 Fr  Pre-procedure: landmarks identified  Ultrasound guidance: yes (Printed copy of US image placed in chart.)  Number of attempts: 1  Successful placement: yes  Post-procedure: line sutured and dressing applied  Assessment: blood return through all parts and free fluid flow  Patient tolerance: Patient tolerated the procedure well with no immediate complications  Comments: US image demonstrated carotid artery and larger/compressible jugular vein during wire placement.  Line secured.  PA catheter inserted to PAOP without  incident and withdrawn 1-2 cm in anticipation of patient cooling.  No complications, tolerated well.

## 2016-01-27 NOTE — Procedures (Signed)
OPERATIVE NOTE    DATE: 01/27/2016    STAFF: Otho Bellows MD    FIRST ASSISTANT: [name] MD.    Dr. Maxie Better was present from the key portions of the operation to assist going on cardiopulmonary bypass.  He helped resect and replace the aortic valve.  He helped close the aorta.    SECOND ASSISTANT: Hilda Blades PA    PA. Elson Areas was present for helping open the chest and cannulate the patient for cardiopulmonary bypass.  He helped close the chest and escort the patient to cardiac intensive care unit.    PREOPERATIVE DIAGNOSES:    1. Severe symptomatic aortic stenosis                        Ejection fraction 15%                        Normal coronaries                        Predominant symptom dyspnea                     2. Severe Pulmonary Hypertension  3. Mild to Moderate Mitral regurgitation  4. Morbid obesity                        Weight loss 50 pounds in the last one year  5. respiratory insufficiency  6. Non-ST elevation MI                        The demand ischemia  7. Iron deficiency anemia                        CT chest abdomen and pelvis reviewed no abdominal masses                        Likely AVM secondary to aortic stenosis not a good candidate for sedation and EGD or colonoscopy well aortic stenosis is so critical    POSTOPERATIVE DIAGNOSES: same as above    OPERATIVE PROCEDURE:  1.Aortic Valve Replacement   2.Intra-Aortic Balloon Pump insertion   Right femoral  3.Pleural and Pericardial Drains  4.Ventricular and Atrial Temporary Pacing wires  5.TEE GUIDED IABP  6.BIOMET STERNAL PLATES    IMPLANTS:  23mm MAGNA Edwards Pericardial Tissue  40cc IABP    INDICATIONS FOR PROCEDURE: The patient is a 60 y.o. she who presented with history of severe AS with heart failure.  She had a echo and cardiac cath showing EF 25% with normal coronary arteries.  She was advised to have surgical correction.  The patient understood the risks of the procedure that included death, arrythmia, infection, bleeding,  stroke and agreed to move forward without reservation.    FINDINGS DURING THE PROCEDURE:   The aorta was free of any significant plaque in the ascending portion at the sites of cannulation and cross clamp.  The Aortic Valve was bicuspid with severe calcification  The patient came off cardiopulmonary bypass with assist of intra-aortic balloon pump. She was transferred to ICU.  Cross clamp time was approx 55 minutes, bypass time was approx 83 minutes.    DETAILS OF THE PROCEDURE: The patient was brought into the operating room and laid supine position.  After he had been interfaced with the  appropriate monitors, general endotracheal anesthesia was induced and invasive monitoring lines including right IJ introducer, PA catheter, right radial A-line, Foley catheter were placed. The patient was then prepped and draped from chin to bilateral ankles in the usual sterile fashion.     After prepping and draping the patient a proper time-out was conducted and site identification was performed, and subsequently incision was made overlying the sternum and median sternotomy was performed.    Pump dose of heparin was given.     Pericardium was opened and suspended. Purse-string sutures were placed. As the ACT climbed above 480 aortic and venous as well as antegrade and retrograde cardioplegia cannulation was performed and the patient was placed on cardiopulmonary bypass and cooled to 28 degrees.  With satisfactory flow, the aorta was cross-clamped and the heart was arrested using antegrade cold blood cardioplegia. An initial dose of about 1000 mL was given and this was followed by intermittent doses given both antegrade and retrograde throughout the procedure to maintain a good arrest and to protect the heart.    Aortotomy was then created 2 cm above the coronary ostia.  Retraction sutures were then placed.  The valve was inspected.  The valve leaflets were excised and sent for pathology.  Annular decalicification was completed  making sure to clean all free calcium with suction.  Copious amounts of irrigation was then used to clean the annulus and left ventricular outflow tract. Annular sizing was completed showing size 23.  2-0 Tycron pledgeted sutures were then place circumferentially around the annulus.  The valve was brought on to the field and prepped in the usual sterile fashion.  The stitches were then passed through the sewing ring and the valve and it was parachuted into position.  With the valve seated nicely the stitches were all tied.  The Aortotomy was then closed with double layer 4-0 prolene    The patient was given a warm shot retrograde.  The patient was slowly rewarmed while de-airing maneuvers were performed. Following this, the aortic cross clamp was removed and the heart was noted to resume spontaneous coordinated contractile activity. Drains were placed in the left/right chest as well as in the mediastinum.  After an adequate period of rewarming during which time, temporary AV-pacing wires were also placed.  It was clear she would needs assistance coming of cardiopulmonary bypass.  Right femoral line was placed and with TEE US guidance we were able to position the IABP.   The patient was successfully weaned off cardiopulmonary bypass.  With satisfactory hemodynamics, good function heparin was reversed using protamine. Decannulation was performed after volume resuscitation. Hemostasis was assured. Mediastinal and pericardial fat and pericardium were loosely reapproximated in the midline and chest was closed in layers using interrupted stainless steel wires to reappose the two sternal halves BIOMET STERNAL PLATES, heavy PDS for musculofascial closure, small vicryl for subcuticular skin closure. Dressings were applied. The patient was transferred to the ICU in stable condition. He tolerated the procedure well. All counts were correct at the termination of the procedure x2.     EBL 1000 Cell Saver    Otho Bellows  MD  Cardiothoracic Surgery

## 2016-01-27 NOTE — Consults (Deleted)
PULMONARY FUNCTION TEST    Spirometry reveals an FEV1 of 1.3 liters (47% predicted), which is reduced.    FEV1/FVC ratio is 85% which is within normal limits, indicating no obstructive   defect.    CONCLUSION:  Spirometry does not reveal obstructive defect.  Spirometry suggests  a restrictive defect, although no lung volumes available to confirm.  Please   obtain a complete pulmonary function test if there is a clinical concern.    Alto Denver, MD    AM/rt  D:  01/27/2016  10:20 P   T:  01/28/2016  2:13 A   TID:  [id number]  RECEIPT:    [id number]

## 2016-01-27 NOTE — Plan of Care (Signed)
End of Shift Report     Significant Events None     Relevant   Assessment Findings Tele remains ST100-130's, pt also remains tachypneic w/ RR 30's, pt does not appear to be in any distress but does have some orthopnea with difficulty sleeping.       Patient/Family  Questions/Concerns Would like to see surgeon prior to AVR tomorrow.      DC Barriers Awaiting surgery.

## 2016-01-27 NOTE — Progress Notes (Signed)
Respiratory End of Shift Summary   01/27/2016                    9:24 PM         Sensorium    Intubated, sedated.    Breath Sounds   (changes during shift) Clear/dim    Response to therapy   (or changes in therapy) N/A   Is cough productive or require suctioning (frequency, characteristics)       ETT sxn small amount of clear secretions.     Ventilator  (start & end of shift settings   and other relevant assessments)             Start: PRVC/AC 480, 28, 30, 5.  End: PRVC/AC 480, 24, 30, 5.    BiPAP   (settings, changes, tolerance, usage, continuous, sleep only) N/A    Weaning attempts/plans                  Not indicated at this time.    Skin Integrity Intact, no breakdown noted.      Significant Events  (transport, procedures) AVR on 8/17.   Other Comments Pt on continuous Flolan for pulmonary hypertension. 8 ml per/hr. Started last syringe at 21:00.     Dwyane Dee, RT

## 2016-01-27 NOTE — Progress Notes (Addendum)
CARDIAC SURGERY     INR 1.7       Will need 2 FFP post bypass  I would like to give blood on bypass two units   Hemo-concentrate during bypass    And plan for FEIBA or Kcentra after protamine is given     She has volume overload secondary to heart failure.    High pulmonary pressures I will want milrinone and epi to disconnect from bypass        Otho Bellows MD  Cardiothoracic Surgery

## 2016-01-27 NOTE — Progress Notes (Addendum)
RESPIRATORY ASSESSMENT    SUBJECTIVE:    The patient is intubated on mechanical ventilation.     OBJECTIVE:   Current Vital Signs:    Pre and post C/S:    HR:     RR:                Cough:   SpO2:      ABG Results: Ph:   , CO2:     ,PO2:      , HCO3:   Vent/Bipap Settings:    CXR:     ASSESSMENT:     The patient arrived post surgery AVR to the CCU intubated and ventilated with ambu-bag. Pt placed on mechanical ventilation. Pt had elevated pulmonary hypertension so continuous Flolan was started.     PLAN:    Mechanical ventilation              Flolan (continuous).    GOAL(S):  Medication:   Decrease pulmonary hypertension.     CMV/NIV:  Maintain a patent airway  Normalize and maintain adequate ABG values for patient clinical situation  Prevent or correct atelectasis   Minimize risk of VAP              Maintain Home CMV/NIV routine    Supplemental Oxygen:   Maintain SpO2 >90%  Normalize and maintain an adequate SpO2/PaO2 appropriate for patient clinical situation    Per PDP # 102 and MD orders    E.S.  Dwyane Dee, RRT

## 2016-01-28 ENCOUNTER — Inpatient Hospital Stay: Admit: 2016-01-28 | Payer: MEDICAID

## 2016-01-28 LAB — CBC WITHOUT DIFFERENTIAL
HCT: 31.5 % — ABNORMAL LOW (ref 37.0–48.0)
HCT: 28.4 % — ABNORMAL LOW (ref 37.0–48.0)
Hemoglobin: 9.9 g/dL — ABNORMAL LOW (ref 12.0–16.0)
Hemoglobin: 8.9 g/dL — ABNORMAL LOW (ref 12.0–16.0)
MCH: 22.6 pg — ABNORMAL LOW (ref 27.0–34.0)
MCH: 22.4 pg — ABNORMAL LOW (ref 27.0–34.0)
MCHC: 31.5 g/dL — ABNORMAL LOW (ref 32.0–36.0)
MCHC: 31.3 g/dL — ABNORMAL LOW (ref 32.0–36.0)
MCV: 71.8 fL — ABNORMAL LOW (ref 81.0–99.0)
MCV: 71.7 fL — ABNORMAL LOW (ref 81.0–99.0)
MPV: 8.1 fL (ref 7.4–10.4)
MPV: 8.5 fL (ref 7.4–10.4)
Platelet Count: 255 10*3/ÂµL (ref 150–400)
Platelet Count: 113 10*3/ÂµL — ABNORMAL LOW (ref 150–400)
RBC: 4.39 10*6/ÂµL (ref 4.20–5.40)
RBC: 3.96 10*6/ÂµL — ABNORMAL LOW (ref 4.20–5.40)
RDW: 21.4 % — ABNORMAL HIGH (ref 11.5–14.5)
RDW: 21.7 % — ABNORMAL HIGH (ref 11.5–14.5)
WBC: 25.2 10*3/ÂµL — ABNORMAL HIGH (ref 4.8–10.8)
WBC: 16.8 10*3/ÂµL — ABNORMAL HIGH (ref 4.8–10.8)

## 2016-01-28 LAB — ABG
A-a Gradient: 84.5 mmHg
A-a Gradient: 75.4 mmHg
Base Excess: -4.2 mmol/L — ABNORMAL LOW (ref ?–2.0)
Base Excess: -2.4 mmol/L — ABNORMAL LOW (ref ?–2.0)
Carboxyhemoglobin: 0.7 % (ref 0.0–1.5)
Carboxyhemoglobin: 1.2 % (ref 0.0–1.5)
FIO2: 30
FIO2: 30
HCO3 Arterial: 19.1 mmol/L — ABNORMAL LOW (ref 22.0–26.0)
HCO3 Arterial: 21.2 mmol/L — ABNORMAL LOW (ref 22.0–26.0)
Hemoglobin: 11.5 g/dL — ABNORMAL LOW (ref 12.0–16.0)
Hemoglobin: 11.2 g/dL — ABNORMAL LOW (ref 12.0–16.0)
Methemoglobin: 0.2 % (ref 0.0–1.0)
Methemoglobin: 0.2 % (ref 0.0–1.0)
Minute Ventilation: 12.3
Minute Ventilation: 10.3
O2Hb: 95.4 % (ref 94.0–97.0)
O2Hb: 95.5 % (ref 94.0–97.0)
Patient Temperature: 37 C
Patient Temperature: 37 C
Peep: 5 cmH2O
Peep: 5 cmH2O
Puncture Attempts: 1
Respiratory Rate: 28 b/min
Respiratory Rate: 24 b/min
Tidal Volume: 480 mL
Tidal Volume: 440 mL
Time Analyzed: 20170817200438
Time Analyzed: 20170818035839
a/A Ratio: 0.5
a/A Ratio: 0.54
pCO2 Arterial: 29.5 mmHg — ABNORMAL LOW (ref 35.0–45.0)
pCO2 Arterial: 32.6 mmHg — ABNORMAL LOW (ref 35.0–45.0)
pH Arterial: 7.429 (ref 7.350–7.450)
pH Arterial: 7.431 (ref 7.350–7.450)
pO2 Arterial: 84.8 mmHg (ref 80.0–100.0)
pO2 Arterial: 90.3 mmHg (ref 80.0–100.0)
sO2 Arterial: 96.3 % (ref 92.0–98.5)
sO2 Arterial: 96.9 % (ref 92.0–98.5)

## 2016-01-28 LAB — CBC WITH AUTO DIFFERENTIAL
Basophils %: 0 % (ref 0–2)
Basophils, Absolute: 0 10*3/ÂµL (ref 0.0–0.2)
Eosinophils %: 0 % (ref 0–7)
Eosinophils, Absolute: 0 10*3/ÂµL (ref 0.0–0.7)
HCT: 31.8 % — ABNORMAL LOW (ref 37.0–48.0)
Hemoglobin: 9.8 g/dL — ABNORMAL LOW (ref 12.0–16.0)
Lymphocytes %: 10 % — ABNORMAL LOW (ref 25–45)
Lymphocytes, Absolute: 1.7 10*3/ÂµL (ref 1.1–4.3)
MCH: 22.2 pg — ABNORMAL LOW (ref 27.0–34.0)
MCHC: 30.8 g/dL — ABNORMAL LOW (ref 32.0–36.0)
MCV: 72 fL — ABNORMAL LOW (ref 81.0–99.0)
MPV: 7.6 fL (ref 7.4–10.4)
Monocytes %: 7 % (ref 0–12)
Monocytes, Absolute: 1.2 10*3/ÂµL (ref 0.0–1.2)
Neutrophils %: 83 % — ABNORMAL HIGH (ref 35–70)
Neutrophils, Absolute: 14.2 10*3/ÂµL — ABNORMAL HIGH (ref 1.6–7.3)
Platelet Count: 180 10*3/ÂµL (ref 150–400)
Platelet Estimate: NORMAL
RBC: 4.41 10*6/ÂµL (ref 4.20–5.40)
RDW: 21.2 % — ABNORMAL HIGH (ref 11.5–14.5)
WBC: 17.1 10*3/ÂµL — ABNORMAL HIGH (ref 4.8–10.8)

## 2016-01-28 LAB — BASIC METABOLIC PANEL
Anion Gap: 10.5 mmol/L (ref 3.0–11.0)
Anion Gap: 6.6 mmol/L (ref 3.0–11.0)
Anion Gap: 5.2 mmol/L (ref 3.0–11.0)
Anion Gap: 7 mmol/L (ref 3.0–11.0)
BUN: 21 mg/dL (ref 6–23)
BUN: 20 mg/dL (ref 6–23)
BUN: 22 mg/dL (ref 6–23)
BUN: 24 mg/dL — ABNORMAL HIGH (ref 6–23)
CO2 - Carbon Dioxide: 20.5 mmol/L — ABNORMAL LOW (ref 21.0–31.0)
CO2 - Carbon Dioxide: 21.4 mmol/L (ref 21.0–31.0)
CO2 - Carbon Dioxide: 21.8 mmol/L (ref 21.0–31.0)
CO2 - Carbon Dioxide: 20 mmol/L — ABNORMAL LOW (ref 21.0–31.0)
Calcium: 9.6 mg/dL (ref 8.6–10.3)
Calcium: 9.2 mg/dL (ref 8.6–10.3)
Calcium: 9.6 mg/dL (ref 8.6–10.3)
Calcium: 9.7 mg/dL (ref 8.6–10.3)
Chloride: 103 mmol/L (ref 98–111)
Chloride: 102 mmol/L (ref 98–111)
Chloride: 105 mmol/L (ref 98–111)
Chloride: 105 mmol/L (ref 98–111)
Creatinine: 1 mg/dL (ref 0.55–1.10)
Creatinine: 0.94 mg/dL (ref 0.55–1.10)
Creatinine: 1.16 mg/dL — ABNORMAL HIGH (ref 0.55–1.10)
Creatinine: 1.89 mg/dL — ABNORMAL HIGH (ref 0.55–1.10)
GFR Estimate: 57 mL/min/{1.73_m2} — ABNORMAL LOW (ref >=60)
GFR Estimate: >60 mL/min/{1.73_m2} (ref >=60)
GFR Estimate: 48 mL/min/{1.73_m2} — ABNORMAL LOW (ref >=60)
GFR Estimate: 27 mL/min/{1.73_m2} — ABNORMAL LOW (ref >=60)
Glucose: 179 mg/dL — ABNORMAL HIGH (ref 80–99)
Glucose: 249 mg/dL — ABNORMAL HIGH (ref 80–99)
Glucose: 129 mg/dL — ABNORMAL HIGH (ref 80–99)
Glucose: 118 mg/dL — ABNORMAL HIGH (ref 80–99)
Potassium: 3.5 mmol/L (ref 3.5–5.1)
Potassium: 3.7 mmol/L (ref 3.5–5.1)
Potassium: 3.7 mmol/L (ref 3.5–5.1)
Potassium: 4.2 mmol/L (ref 3.5–5.1)
Sodium: 134 mmol/L — ABNORMAL LOW (ref 135–143)
Sodium: 130 mmol/L — ABNORMAL LOW (ref 135–143)
Sodium: 132 mmol/L — ABNORMAL LOW (ref 135–143)
Sodium: 132 mmol/L — ABNORMAL LOW (ref 135–143)

## 2016-01-28 LAB — POCT GLUCOSE
POC Glucose: 176 mg/dL — ABNORMAL HIGH (ref 80–99)
POC Glucose: 178 mg/dL — ABNORMAL HIGH (ref 80–99)
POC Glucose: 195 mg/dL — ABNORMAL HIGH (ref 80–99)
POC Glucose: 229 mg/dL — ABNORMAL HIGH (ref 80–99)
POC Glucose: 181 mg/dL — ABNORMAL HIGH (ref 80–99)
POC Glucose: 174 mg/dL — ABNORMAL HIGH (ref 80–99)
POC Glucose: 126 mg/dL — ABNORMAL HIGH (ref 80–99)
POC Glucose: 118 mg/dL — ABNORMAL HIGH (ref 80–99)
POC Glucose: 107 mg/dL — ABNORMAL HIGH (ref 80–99)
POC Glucose: 116 mg/dL — ABNORMAL HIGH (ref 80–99)
POC Glucose: 133 mg/dL — ABNORMAL HIGH (ref 80–99)
POC Glucose: 134 mg/dL — ABNORMAL HIGH (ref 80–99)
POC Glucose: 130 mg/dL — ABNORMAL HIGH (ref 80–99)
POC Glucose: 138 mg/dL — ABNORMAL HIGH (ref 80–99)
POC Glucose: 191 mg/dL — ABNORMAL HIGH (ref 80–99)
POC Glucose: 200 mg/dL — ABNORMAL HIGH (ref 80–99)
POC Glucose: 115 mg/dL — ABNORMAL HIGH (ref 80–99)

## 2016-01-28 LAB — IONIZED CALCIUM
Ionized Calcium: 1.34 mmol/L — ABNORMAL HIGH (ref 1.12–1.30)
Ionized Calcium: 1.32 mmol/L — ABNORMAL HIGH (ref 1.12–1.30)
Ionized Calcium: 1.39 mmol/L — ABNORMAL HIGH (ref 1.12–1.30)

## 2016-01-28 LAB — MAGNESIUM
Magnesium: 1.9 mg/dL (ref 1.6–2.4)
Magnesium: 1.8 mg/dL (ref 1.6–2.4)
Magnesium: 2 mg/dL (ref 1.6–2.4)
Magnesium: 2.1 mg/dL (ref 1.6–2.4)

## 2016-01-28 MED ORDER — methylene blue (PROVAYBLUE) 5 mg/mL (0.5 %) injection 201 mg
5 | Freq: Once | INTRAVENOUS | Status: AC
Start: 2016-01-28 — End: 2016-01-27
  Administered 2016-01-28: 04:00:00 5 mg/kg via INTRAVENOUS

## 2016-01-28 MED ORDER — aspirin suppository 300 mg
300 | Freq: Every day | RECTAL | Status: DC
Start: 2016-01-28 — End: 2016-02-07
  Administered 2016-01-30 – 2016-02-05 (×2): 300 mg via RECTAL

## 2016-01-28 MED FILL — EPINEPHRINE IN D5W 2 MG/250 ML (8 MCG/ML) PREMIX INFUSION: 2 mg/250 mL (8 mcg/mL) | INTRAVENOUS | Qty: 250

## 2016-01-28 MED FILL — FLOLAN 0.5 MG INTRAVENOUS SOLUTION: 0.5 mg | INTRAVENOUS | Qty: 1000

## 2016-01-28 MED FILL — POTASSIUM CHLORIDE 40 MEQ/100 ML IN STERILE WATER INTRAVENOUS PIGGYBACK: 40 meq/dL | INTRAVENOUS | Qty: 100

## 2016-01-28 MED FILL — ALBUTEIN 5 % INTRAVENOUS SOLUTION: 5 % | INTRAVENOUS | Qty: 500

## 2016-01-28 MED FILL — ACETAMINOPHEN 325 MG TABLET: 325 mg | ORAL | Qty: 2

## 2016-01-28 MED FILL — MILRINONE 20 MG/100 ML(200 MCG/ML) IN 5 % DEXTROSE INTRAVENOUS PIGGYBK: 200 ug/mL | INTRAVENOUS | Qty: 100

## 2016-01-28 MED FILL — FAMOTIDINE (PF) IN 0.9% NACL 20 MG/50 ML IVPB PREMIX: 20 mg/50 mL | INTRAVENOUS | Qty: 50

## 2016-01-28 MED FILL — DEXMEDETOMIDINE 100 MCG/ML INTRAVENOUS SOLUTION: 100 ug/mL | INTRAVENOUS | Qty: 4

## 2016-01-28 MED FILL — ACETAMINOPHEN 650 MG RECTAL SUPPOSITORY: 650 mg | RECTAL | Qty: 1

## 2016-01-28 MED FILL — FENTANYL (PF) IN 0.9 % NACL 1250 MCG/250 ML PREMIX INFUSION: 1250 mcg/250 mL (5 mcg/mL) | INTRAVENOUS | Qty: 250

## 2016-01-28 MED FILL — VASOSTRICT 20 UNIT/ML INTRAVENOUS SOLUTION: 20 [IU]/mL | INTRAVENOUS | Qty: 2.5

## 2016-01-28 MED FILL — PROVAYBLUE 5 MG/ML INTRAVENOUS SOLUTION: 5 mg/mL | INTRAVENOUS | Qty: 40.2

## 2016-01-28 MED FILL — MAGNESIUM SULFATE 1 GRAM/100 ML IN DEXTROSE 5 % INTRAVENOUS PIGGYBACK: 1 gram/100 mL | INTRAVENOUS | Qty: 100

## 2016-01-28 MED FILL — CEFAZOLIN 2 GRAM/50 ML IN DEXTROSE (ISO-OSMOTIC) INTRAVENOUS PIGGYBACK: 2 gram/50 mL | INTRAVENOUS | Qty: 50

## 2016-01-28 MED FILL — PHENYLEPHRINE 10 MG/ML INJECTION SOLUTION: 10 mg/mL | INTRAMUSCULAR | Qty: 2

## 2016-01-28 MED FILL — ASPIRIN 300 MG RECTAL SUPPOSITORY: 300 mg | RECTAL | Qty: 1

## 2016-01-28 NOTE — Progress Notes (Signed)
END OF SHIFT SUMMARY:  01/28/16 0630-1900  ?  SENSORIUM:  Pt intubated and sedated, has ballon pump  BS (and any changes with tx):  Diminished clear  TREATMENTS (or changes in treatment): continuous Flolan d/c at 1735  SUCTIONING (amount, color, tenacity):  Small white/clear  ABG: 7.418 27.7 147.8 17.5 -6.0 97.7   XRAY:  8/18 1533?FINDINGS:    Intra-aortic balloon pump is again seen now with the tip at the aortic arch.   Remainder of the lines and tubes are unchanged.  ?  Patchy and hazy bibasilar opacities, prominence of the proximal pulmonary   vasculature and enlargement and indistinctness of the cardiac contour similar   to prior. No pneumothorax.  ?  ?  IMPRESSION:    Intra-aortic balloon pump has been advanced and the tip is now just below the   aortic arch. Remainder of the exam is unchanged.  ?  VENTILATOR/BiPAP CHANGES:  no changes  SBT/WEANING (settings, length of trial, toleration):  N/a at this time  PROCEDURES:  none  TRANSPORTS:  none  ETT ADVANCED/RETRACTED/SECURE:  7.5 24  at lips  SKIN INTEGRITY: good/intact  GOALS/PLAN: monitor and support, wean vent care when appropriate  ADVERSE EVENTS:    COMMENTS:    ?

## 2016-01-28 NOTE — Progress Notes (Signed)
Daily Progress Note     Name: Brandy [name]  DOB: [birth year] 60 y.o.  MRN: [id number]  CSN: [id number]    Subjective:     Brandy [name] is a 60 y.o. female patient. Patient was admitted with Respiratory failure (CMS/HCC)  Who had Procedure(s):  AORTIC VALVE REPLACEMENT; INTRA AORTIC BALLOON INSERTION.    Scheduled Medications: . aspirin  300 mg Rectal Daily   . chlorhexidine  15 mL Mouth/Throat Q12H   . famotidine  20 mg Intravenous 2 times per day   . phenylephrine       . phenylephrine       . sodium chloride 0.9 % (NS) syringe  10 mL Intravenous Q8H SCH     Infusions:   . clevidipine     . dexmedetomidine 1.2 mcg/kg/hr (01/28/16 0924)   . dextrose 5% lactated ringers 30 mL/hr at 01/28/16 0739   . DOBUTamine (DOBUTREX) 250 mg in D5W 250 mL (1000 mcg/mL) infusion     . epoprostenol [facility]) inhalation solution 160 mcg/hr (01/28/16 0935)   . fentanyl 50 mcg/hr (01/28/16 1338)   . insulin (HUMAN R) 150 Units in NaCl 0.9 % 150 mL (1 unit/mL) infusion - cardiac surgery 2 Units/hr (01/28/16 1542)   . milrinone 0.4 mcg/kg/min (01/28/16 1324)   . nitroglycerin     . potassium chloride 2 mEq/hr (01/28/16 1324)   . propofol Stopped (01/27/16 1946)   . vasopressin (PITRESSIN) 50 units in NaCl 0.9 % 250 mL (0.2 units/mL) infusion **ADULT SHOCK** 0.02 Units/min (01/28/16 1107)     PRN Medications: acetaminophen (TYLENOL) suppository, acetaminophen, albumin, human 5 %, calcium gluconate IVPB, clevidipine, dextrose 5% lactated ringers, DOBUTamine (DOBUTREX) 250 mg in D5W 250 mL (1000 mcg/mL) infusion, EPINEPHrine, fentanyl, hydrALAZINE, HYDROcodone-acetaminophen, insulin (HUMAN R) 150 Units in NaCl 0.9 % 150 mL (1 unit/mL) infusion - cardiac surgery, magnesium sulfate IVPB, magnesium sulfate IVPB, meperidine (PF), milrinone, morphine, nitroglycerin, oxyCODONE, phenylephrine (NEO-SYNEPHRINE) in 20 mg D5W 500 mL (40 mcg/mL) infusion, [EXPIRED] potassium chloride **FOLLOWED BY** potassium chloride, propofol, protamine,  sodium chloride, sodium chloride 0.9 % (NS) syringe    Objective:     Vital Signs:    Vital Sign Ranges for Last 24 Hours:  Temp  Min: 36.8 ?C (98.2 ?F)  Max: 38.8 ?C (101.8 ?F)  Pulse  Min: 82  Max: 114  Resp  Min: 20  Max: 29  SpO2  Min: 86 %  Max: 100 %    Most Recent Vitals  BP: 92/75  Pulse: 106  Resp: 26  Temp: 38 ?C (100.4 ?F)  SpO2: 97 % (08/17 1004-08/18 1505)    Intake/Ouput:    Intake/Output Summary (Last 24 hours) at 01/28/16 1618  Last data filed at 01/28/16 1542   Gross per 24 hour   Intake           5535.5 ml   Output             1086 ml   Net           4449.5 ml        Exam:  Neuro:intubated  Cardiac:regular rate and rhythm, S1, S2 normal, no murmur, click, rub or gallop  Lungs;corse bases  ABDOMEN:abdomen is soft without significant tenderness, masses, organomegaly or guarding  EXTREMITIES:less then 2 second capillary refill    Recent Labs:  Lab Results   Component Value Date    HGB 8.9 (L) 01/28/2016    HCT 28.4 (L) 01/28/2016    CHOL 87 01/22/2016  TRIG 79.0 01/22/2016    HDL 19 (L) 01/22/2016    ALT 26 01/26/2016    AST 28 01/26/2016    NA 132 (L) 01/28/2016    K 3.7 01/28/2016    CL 105 01/28/2016    BUN 22 01/28/2016    CO2 21.8 01/28/2016    TSH 2.09 01/22/2016    INR 1.7 (H) 01/26/2016     Creatinine   Date Value Ref Range Status   01/28/2016 1.16 (H) 0.55 - 1.10 mg/dL Final       Recent Imaging:  X-ray Chest Pa Or Ap    Result Date: 01/28/2016  IMPRESSION:  Intra-aortic balloon pump has been advanced and the tip is now just below the aortic arch. Remainder of the exam is unchanged.     X-ray Chest Pa Or Ap    Result Date: 01/28/2016  IMPRESSION:  Intra-aortic balloon pump has been pulled back approximately 4 cm. Remainder of the exam is unchanged lines and tubes unchanged, no pneumothorax, and findings compatible with congestive heart failure/fluid overload pattern.     X-ray Chest Pa Or Ap    Result Date: 01/28/2016  IMPRESSION:  ET tube is been pulled back with the tip now 3 cm above the  carina. New intra-aortic balloon pump. Lines and tubes otherwise unchanged. No pneumothorax. Congestive heart failure/fluid overload pattern similar to prior.       Assessment & Plan:     Active Hospital Problems    Diagnosis SNOMED CT(R) Date Noted   . S/P AVR (aortic valve replacement) HISTORY OF AORTIC VALVE REPLACEMENT 01/27/2016   . Respiratory failure (CMS/HCC) RESPIRATORY FAILURE 01/23/2016   . Aortic stenosis, severe AORTIC VALVE STENOSIS 01/22/2016   . NSTEMI (non-ST elevated myocardial infarction) (CMS/HCC) ACUTE NON-ST SEGMENT ELEVATION MYOCARDIAL INFARCTION 01/21/2016   . Demand ischemia of myocardium (CMS/HCC) ACUTE ISCHEMIC HEART DISEASE 01/21/2016   . Iron deficiency anemia IRON DEFICIENCY ANEMIA 01/21/2016   . Obesity OBESITY 01/21/2016       IABP re advanced about 5 cm  It had been pulled back during todays activities and UO has been down. The IABP was advanced 4-5 cm and now appears appropriately placed. Hemodynamics unchanged.   LOS: 5 days     Brandy [name]  01/28/2016  4:18 PM

## 2016-01-28 NOTE — Progress Notes (Signed)
Nutrition Assessment     Carah Barrientes is a 60 y.o. female, DOB Mar 14, 1956    Reason for assessment:  Initial assessment - CCU     Subjective:  No recent unplanned wt loss or poor PO intake noted per admission screen. Pt is currently intubated and sedated.     Anthropometric Measurements:   Ht: 167.6 cm (66)        Admit Wt: 101.4 kg (224 lbs)  IBW: 59.09 kg (130 lbs)   %IBW: 172%  BMI: 36.1 (no edema)   Ht/wt status:obese class 2              Nutrition-Focused Physical Findings:  Patient appears well-nourished and well-hydrated   Poor dentition per physician note   Nutrition risk factors: Current morbidity   Risk for nutritional deficit:high d/t current intubation       Food and Nutritional Intake:  Diet Orders:   Procedures   . Diet, NPO     PO Intake is inadequate    Other Allergies: Sulfa (sulfonamide antibiotics)     Client History:  Admission Diagnosis: Respiratory failure (CMS/HCC) [J96.90]    Diagnosis:     SNOMED CT(R)   1. Aortic stenosis, severe  AORTIC VALVE STENOSIS   2. Acute systolic heart failure (CMS/HCC)  ACUTE SYSTOLIC HEART FAILURE   3. Demand ischemia of myocardium (CMS/HCC)  ACUTE ISCHEMIC HEART DISEASE   4. Non-ischemic cardiomyopathy (CMS/HCC)  CARDIOMYOPATHY     Past Medical History:   Diagnosis Date   . History of blood transfusion      Past Surgical History:   Procedure Laterality Date   . PROCEDURE N/A 01/27/2016    Procedure: AORTIC VALVE REPLACEMENT; INTRA AORTIC BALLOON INSERTION;  Surgeon: Otho Bellows, MD;  Location: [facility] OR;  Service: Cardiology;  Laterality: N/A;   . TONSILLECTOMY     . TUBAL LIGATION     . TUBAL LIGATION  1980       Usual meds include: Reviewed   Current meds include: Pepcid, lasix, insulin, zofran, potassium chloride    Potassium Protocol: Not noted   Bowel Protocol: Not noted   Propofol: None currently   Cleviprex: None currently   Pressors: Epinephrine, vasopressin   Prokinetics: None   Parayltics: None   Levothyroxine: None  Coumadin: None              Biochemical Data, Medical Tests and Procedures:  Lab Results   Component Value Date    NA 132 (L) 01/28/2016    K 3.7 01/28/2016    CL 105 01/28/2016    CO2 21.8 01/28/2016    GLU 129 (H) 01/28/2016    BUN 22 01/28/2016    CREATININES 1.16 (H) 01/28/2016    MG 2.0 01/28/2016      Ref. Range 01/26/2016 20:02   % GLYCOHEMOGLOBIN (A1C) Latest Ref Range: 4.3 - 6.1 % 6.0     CBG: 107-229mg /dL    Nutrition Diagnosis (Problem, Etiology, Signs and Symptoms)  Inadequate protein-energy intake related to inability to consume nutrients via oral route as evidenced by mechanical ventilation and lack of nutrition support.   (Progress:  Not applicable.)    Nutrition Intervention:   Admit Wt: 101.4 kg (224 lbs)  IBW: 59.09 kg (130 lbs)    Estimated needs:  11-14 kcals/kg actual wt or 1115 - 1420 kcals  2g protein/kg IBW or 118g protein     If unable to extubated pt and if TF is clinically appropriate, recommendation would be for: Peptamen  Intense VHP  @ goal rate of 29mL/hr providing 1320 kcals, 121g protein, free water. Additional free water per physician.     Monitoring and Evaluation:   Will follow and monitor TF initiation/tolerance, labs, and status per nutrition protocol.      Follow Up: 1-3 days      Teola Bradley, RD  01/28/2016  7:56 AM  [facility]

## 2016-01-28 NOTE — Consults (Addendum)
Critical Care Consultation note:    Requesting Physician: Dr. Debroah Baller    Reason for Consultation:  Post-operative management of critical care issues.    HPI: This is a 60 y.o. year-old female with critical symptomatic aortic stenosis who underwent AVR yesterday. We were consulted for critical care management while he is in the CCU. Her intraoperative LVEF was only 15%. She required the IABP post operatively for cardiogenic shock. Hence extubation has been delayed. She continues to require epinephrine, milrinone and vasopressin infusions. Her cardiac indices look good on these meds and the IABP at 2:1.  She is well sedated with propofol and fentanyl.    Past Medical History:   Diagnosis Date   . History of blood transfusion      Past Surgical History:   Procedure Laterality Date   . PROCEDURE N/A 01/27/2016    Procedure: AORTIC VALVE REPLACEMENT; INTRA AORTIC BALLOON INSERTION;  Surgeon: Otho Bellows, MD;  Location: [facility] OR;  Service: Cardiology;  Laterality: N/A;   . TONSILLECTOMY     . TUBAL LIGATION     . TUBAL LIGATION  1980     Current Medications, allergies, family history and social history reviewed from Otho Bellows, MD on 01/23/2016.    Review of Systems:  A 12 point review of systems was addressed and the pertinent positives have been mentioned in the HPI    Physical Exam:  Vitals:    01/28/16 1505   BP:    Pulse:    Resp:    Temp:    SpO2: 97%     Gen: 60 y.o. yr old well-nourished, well-developed female lying in bed intubated and sedated.  Eyes: PERRL, Lids and Conjunctivae: no discharge or pallor and non-injected. Corneas: grossly intact. Sclerae: non-icteric.   ENMT: orally intubated.  Neck: Supple, No LAD, No JVD, No thyromegaly. RIJ Theone Murdoch.  Respiratory: clear to auscultation. No use of accessory muscles. No wheezes, rhonchi or crackles.  Chest: Symmetric, equal rise with inspiration, no masses or tenderness. Sternotomy wound clean. 2 Chest tubes.  Cardiac: RRR, IABP sounds predominate, no  peripheral edema, peripheral pulses palpable bilaterally.  Abdm: Soft, no guarding/rigidity/tenderness/rebound tenderness/hepatosplenomegaly  Muskuloskeletal: no edema, no cyanosis, no clubbing. Gait not examined.  Skin: Warm, no obvious lesions or ulcers.   Neuro: sedated    Labs: reviewed    Imaging: reviewed personally and independently    Assessment:     Patient Active Problem List   Diagnosis SNOMED CT(R)   . Heart failure (CMS/HCC) HEART FAILURE   . Elevated brain natriuretic peptide (BNP) level HORMONE LEVEL - FINDING   . NSTEMI (non-ST elevated myocardial infarction) (CMS/HCC) ACUTE NON-ST SEGMENT ELEVATION MYOCARDIAL INFARCTION   . Demand ischemia of myocardium (CMS/HCC) ACUTE ISCHEMIC HEART DISEASE   . Tachycardia TACHYCARDIA   . Pleural effusion PLEURAL EFFUSION   . Obesity OBESITY   . Acute respiratory failure with hypoxia (CMS/HCC) ACUTE RESPIRATORY FAILURE   . Hypotension LOW BLOOD PRESSURE   . Iron deficiency anemia IRON DEFICIENCY ANEMIA   . Thrombocytosis (CMS/HCC) THROMBOCYTOSIS   . Weight loss WEIGHT DECREASED   . Family history of cancer FAMILY HISTORY OF CANCER   . Cardiomegaly CARDIOMEGALY   . Mediastinal lymphadenopathy MEDIASTINAL LYMPHADENOPATHY   . Hilar lymphadenopathy- bilataeral  HILAR LYMPHADENOPATHY   . Chest pain CHEST PAIN   . Dyspnea and respiratory abnormality DYSPNEA   . Abnormal EKG ELECTROCARDIOGRAM ABNORMAL   . Opacity of lung on imaging study ABNORMAL FINDINGS ON DIAGNOSTIC IMAGING OF LUNG   .  Liver lesion- small hypodense lesion around falciform lig LESION OF LIVER   . Aortic stenosis, severe AORTIC VALVE STENOSIS   . Acute systolic heart failure (CMS/HCC) ACUTE SYSTOLIC HEART FAILURE   . Non-ischemic cardiomyopathy (CMS/HCC) CARDIOMYOPATHY   . Respiratory failure (CMS/HCC) RESPIRATORY FAILURE   . S/P AVR (aortic valve replacement) HISTORY OF AORTIC VALVE REPLACEMENT       Plan:    Neurologic: Analgesia and sedation as required.   Pulmonary: Continue mechanical ventilation  until hemodynamics stabilize. No SBTs until then. Inhaled Flolan for high pulmonary pressures. No major gas exchange issues.  Cardiac: Currently on milrinone, epinephrine and vasopressin. IABP 1:2. Continue aspirin. Defer to primary team.  ID: perioperative cefazolin.  Renal: acute renal failure CPB related. Monitor Cr and urine output. Will check urine electrolytes and renal duplex. Urine output dwindling. Will request nephrology consult in am.   GI: NPO. H2b.  Endocrine: maintain euglycemia.  Heme: monitor H&H and transfuse as necessary. Monitor thrombocytopenia    Prophylaxis: H2b, SCDs, HOB elevation    Code Status: full    Please call us for critical medical issues.    This patient remains at high risk of organ failure, deterioration, and/or death and continues to require critical care management and hence is in a critical care unit, requiring intensive monitoring.     Critical Care Time, excluding billable procedures, greater than: 30 mins    Brandy [name]

## 2016-01-28 NOTE — Progress Notes (Addendum)
CARDIOTHORACIC SURGERY CRITICAL CARE NOTE     Name: Brandy [name]  DOB: 06-03-1956 60 y.o.  MRN: [id number]  CSN: [id number]    Brandy [name] is a 60 y.o. female patient. Patient was admitted with Respiratory failure (CMS/HCC)  Who had Procedure(s):  AORTIC VALVE REPLACEMENT; INTRA AORTIC BALLOON INSERTION.  1 Day Post-Op    HPI: The patient is a 60 y.o. female with the below medical co-morbid conditions.    ?  1. Severe symptomatic aortic stenosis                        Ejection fraction 30%     Found on TEE intra-op to be 15%                        Normal coronaries                        Predominant symptom dyspnea                     2. Severe Pulmonary Hypertension  3. Mild to Moderate Mitral regurgitation  4. Morbid obesity                        Weight loss 50 pounds in the last one year  5. respiratory insufficiency  6. Non-ST elevation MI                        The demand ischemia  7. Iron deficiency anemia                        CT chest abdomen and pelvis reviewed no abdominal masses                        Likely AVM secondary to aortic stenosis not a good candidate for sedation and EGD or colonoscopy well aortic stenosis is so critical  ?  Patient's a 59 year old female who presented the [facility] falls in respiratory distress unable to breathe.  There are her troponin was elevated she underwent oxygen therapy and was given Lasix.  Cardiac workup showed normal coronary arteries with depressed LV function on echo and severe aortic stenosis valve area less than 0.4 velocity 4.4 m/s with an EF 30%.  She was transfused 2 units of PRBCs A [facility] hospital with a hemoglobin 8.8.  His immobility volume overload from heart failure  ?  Patient hasn't been to the doctor in years she hasn't taken any medications nor does she take good care of herself.  [place] she is unable to consistently take meds.  She likes to use and asked to [employer] wood to heat her house.    ?  After a few  days of Lasix patient states that she was breathing much better she is 60% better per her suggestion.  I had a long talk with the family about the disease course why she was getting short of breath and discussion of heart failure.  They understand that she is given a need aortic valve replacement she wishes to have tissue valve I do think she is a better surgical candidate and have her given her age and risk.  ?  She has very poor dentition    POD#1  Patient has been  stable on balloon 1:2   Methylene blue last night SVR much better   Coronary perfusion good   Today I am managing acute heart failure with management of the IABP.        Subjective: pt doing well     Assessment & Plan:     Active Hospital Problems    Diagnosis SNOMED CT(R) Date Noted   . S/P AVR (aortic valve replacement) HISTORY OF AORTIC VALVE REPLACEMENT 01/27/2016   . Respiratory failure (CMS/HCC) RESPIRATORY FAILURE 01/23/2016   . Aortic stenosis, severe AORTIC VALVE STENOSIS 01/22/2016   . NSTEMI (non-ST elevated myocardial infarction) (CMS/HCC) ACUTE NON-ST SEGMENT ELEVATION MYOCARDIAL INFARCTION 01/21/2016   . Demand ischemia of myocardium (CMS/HCC) ACUTE ISCHEMIC HEART DISEASE 01/21/2016   . Iron deficiency anemia IRON DEFICIENCY ANEMIA 01/21/2016   . Obesity OBESITY 01/21/2016     Neurologic:  Sedated    On sedation vacation followed commands  Cardiac: Pre-op EF:   EF 15% prior to surgery   Post op 30% with good valve function 23mm valve     1. Inotropes EPI/Milrinone   2. Pressors: vaso   3. Rhythm: Normal sinus rhythum with intact AVN   4. Aspirin: 325    5. Beta-Blocker: hold   6. Statin: hold   7. ACE/ARB: hold    WILL NEED ON DISCHARGE    Respiratory:    Smoker none   Pulmonary toilet    Xray: IABP ok   Pulmonary vasoconstriction     Gastrointestinal:    Genitourinary: creat: pre op 0.9   Now 1.16     Infectious Disease:  WBC: 17.     Hematology: HGB 9.8   PLTS 180  Endocrine: Glucose management   Electrolytes: replacing  Mg/K/Ca  Prophylaxis: Lovenox hold  PPI/H2 Blocker     Scheduled Medications:   . aspirin  300 mg Rectal Daily   . chlorhexidine  15 mL Mouth/Throat Q12H   . famotidine  20 mg Intravenous 2 times per day   . phenylephrine       . phenylephrine       . sodium chloride 0.9 % (NS) syringe  10 mL Intravenous Q8H SCH     Infusions:   . clevidipine     . dexmedetomidine 0.8 mcg/kg/hr (01/28/16 0534)   . dextrose 5% lactated ringers 30 mL/hr at 01/27/16 1445   . DOBUTamine (DOBUTREX) 250 mg in D5W 250 mL (1000 mcg/mL) infusion     . epoprostenol [facility]) inhalation solution 160 mcg/hr (01/28/16 0316)   . fentanyl 50 mcg/hr (01/27/16 1445)   . insulin (HUMAN R) 150 Units in NaCl 0.9 % 150 mL (1 unit/mL) infusion - cardiac surgery 3 Units/hr (01/28/16 0558)   . milrinone 0.5 mcg/kg/min (01/28/16 0533)   . nitroglycerin     . potassium chloride 3 mEq/hr (01/27/16 2250)   . propofol Stopped (01/27/16 1946)   . vasopressin (PITRESSIN) 50 units in NaCl 0.9 % 250 mL (0.2 units/mL) infusion **ADULT SHOCK** 0.04 Units/min (01/27/16 1713)     Recent Labs:  Lab Results   Component Value Date    NA 132 (L) 01/28/2016    K 3.7 01/28/2016    K 3.69 01/23/2016    CL 105 01/28/2016    CL 104.1 01/23/2016    CO2 21.8 01/28/2016    CO2 20 (L) 01/23/2016    GLU 129 (H) 01/28/2016    GLU 85 01/23/2016    BUN 22 01/28/2016    CREATININES 1.16 (H) 01/28/2016  CALCIUM 9.6 01/28/2016    AST 28 01/26/2016    ALT 26 01/26/2016    ALKPHOS 72 01/26/2016    BILITOT 0.90 01/21/2016    ALBUMIN 3.3 (L) 01/26/2016    GLOB 3.3 01/26/2016    AGRATIO 1.0 01/26/2016    ANIONGAP 5.2 01/28/2016    ANIONGAP 21.0 (H) 01/23/2016    LABGLOM 48 (L) 01/28/2016    LABGLOM >60.0 01/23/2016    WHITEBLOODCE 17.1 (H) 01/28/2016    LABPLAT 180 01/28/2016    HGB 9.8 (L) 01/28/2016    PROTIME 18.7 (H) 01/26/2016    INR 1.7 (H) 01/26/2016    APTT 28 01/26/2016          Vital Signs:      ? Cardiac output CCO: 4.9 L/Min    ? Cardiac Index CCI: 2.3 L/min  Temp: 37.4 ?C (99.3  ?F) BP: 92/75 Pulse: 97 Resp: 24 SpO2: 97 % on   Ventilator   Intake/Output:     CT OUTPUT: 500    URINE OUTPUT: 1400    Exam:  Neuro:no TIA or stroke-like symptoms  Cardiac:regular rate and rhythm, S1, S2 normal, no murmur, click, rub or gallop  Lungs;clear  ABDOMEN:abdomen is soft without significant tenderness, masses, organomegaly or guarding  EXTREMITIES:less then 2 second capillary refill    PLAN:   Inotrope adjustments today  Continue balloon pump today     Critical Care Time: 40 minutes outside any procedures and family discussion.  Patient remains critical and at high risk for organ failure and death.      Otho Bellows

## 2016-01-28 NOTE — Anesthesia Post-Procedure Evaluation (Signed)
Patient Name: Henriette Hesser  Procedures performed: Procedure(s):  AORTIC VALVE REPLACEMENT; INTRA AORTIC BALLOON INSERTION    Last Vitals:   Vitals:    01/28/16 1130   BP:    Pulse: 105   Resp: (!) 29   Temp: (!) 38.2 ?C   SpO2: 95%       Planned Anesthesia Type: general  Final Anesthesia Type: general  Patients Current Location: ICU/CCU  Level of Consciousness: sedated and responds to stimulation  Airway: Patent  Respiratory Status: ventilator  Cardio Status: tachycardic  no anesthesia complication,       The patient was unable to participate in the post op evaluation. Please see comments below.     Comments:Patient remains on ventilator needing substantial cardiovascular support.      Trudee Kuster  3:03 PM

## 2016-01-28 NOTE — Progress Notes (Signed)
END OF SHIFT SUMMARY:      SENSORIUM:  Pt intubated and sedated, has ballon pump  BS (and any changes with tx):  Diminished clear  TREATMENTS (or changes in treatment):  Pt getting continuous Flolan via [employer] and pump.  SUCTIONING (amount, color, tenacity):  Small white/clear  ABG: pH: 7.431   CO2:32.6  PO2:90   HCO3: 21 BE: -2.4  O2 Sat:97  XRAY:  There are perihilar and basilar opacities suggestive of edema or   atelectasis which have improved since prior. Improved RIGHT small effusion.    Median sternotomy changes have been performed.  ?  VENTILATOR/BiPAP CHANGES:  Rate down to 20 after am abg  SBT/WEANING (settings, length of trial, toleration):  N/a at this time  PROCEDURES:  none  TRANSPORTS:  none  ETT ADVANCED/RETRACTED/SECURE:  7.5 24  at lips  SKIN INTEGRITY: good/intact  GOALS/PLAN:  Wean vent as soon as pt meets criteria.  ADVERSE EVENTS:    COMMENTS:

## 2016-01-29 ENCOUNTER — Inpatient Hospital Stay: Admit: 2016-01-29 | Payer: MEDICAID

## 2016-01-29 LAB — BASIC METABOLIC PANEL
Anion Gap: 6.6 mmol/L (ref 3.0–11.0)
Anion Gap: 8.4 mmol/L (ref 3.0–11.0)
Anion Gap: 9.8 mmol/L (ref 3.0–11.0)
Anion Gap: 5.1 mmol/L (ref 3.0–11.0)
Anion Gap: 5.8 mmol/L (ref 3.0–11.0)
BUN: 19 mg/dL (ref 6–23)
BUN: 27 mg/dL — ABNORMAL HIGH (ref 6–23)
BUN: 29 mg/dL — ABNORMAL HIGH (ref 6–23)
BUN: 29 mg/dL — ABNORMAL HIGH (ref 6–23)
BUN: 30 mg/dL — ABNORMAL HIGH (ref 6–23)
CO2 - Carbon Dioxide: 13.4 mmol/L — ABNORMAL LOW (ref 21.0–31.0)
CO2 - Carbon Dioxide: 17.6 mmol/L — ABNORMAL LOW (ref 21.0–31.0)
CO2 - Carbon Dioxide: 16.2 mmol/L — ABNORMAL LOW (ref 21.0–31.0)
CO2 - Carbon Dioxide: 18.9 mmol/L — ABNORMAL LOW (ref 21.0–31.0)
CO2 - Carbon Dioxide: 18.2 mmol/L — ABNORMAL LOW (ref 21.0–31.0)
Calcium: 6.8 mg/dL — ABNORMAL LOW (ref 8.6–10.3)
Calcium: 9.8 mg/dL (ref 8.6–10.3)
Calcium: 10.4 mg/dL — ABNORMAL HIGH (ref 8.6–10.3)
Calcium: 10.2 mg/dL (ref 8.6–10.3)
Calcium: 9.6 mg/dL (ref 8.6–10.3)
Chloride: 118 mmol/L — ABNORMAL HIGH (ref 98–111)
Chloride: 105 mmol/L (ref 98–111)
Chloride: 104 mmol/L (ref 98–111)
Chloride: 105 mmol/L (ref 98–111)
Chloride: 106 mmol/L (ref 98–111)
Creatinine: 1.44 mg/dL — ABNORMAL HIGH (ref 0.55–1.10)
Creatinine: 2.45 mg/dL — ABNORMAL HIGH (ref 0.55–1.10)
Creatinine: 2.64 mg/dL — ABNORMAL HIGH (ref 0.55–1.10)
Creatinine: 2.73 mg/dL — ABNORMAL HIGH (ref 0.55–1.10)
Creatinine: 2.97 mg/dL — ABNORMAL HIGH (ref 0.55–1.10)
GFR Estimate: 37 mL/min/{1.73_m2} — ABNORMAL LOW (ref >=60)
GFR Estimate: 20 mL/min/{1.73_m2} — ABNORMAL LOW (ref >=60)
GFR Estimate: 18 mL/min/{1.73_m2} — ABNORMAL LOW (ref >=60)
GFR Estimate: 18 mL/min/{1.73_m2} — ABNORMAL LOW (ref >=60)
GFR Estimate: 16 mL/min/{1.73_m2} — ABNORMAL LOW (ref >=60)
Glucose: 107 mg/dL — ABNORMAL HIGH (ref 80–99)
Glucose: 138 mg/dL — ABNORMAL HIGH (ref 80–99)
Glucose: 164 mg/dL — ABNORMAL HIGH (ref 80–99)
Glucose: 134 mg/dL — ABNORMAL HIGH (ref 80–99)
Glucose: 131 mg/dL — ABNORMAL HIGH (ref 80–99)
Potassium: 3.4 mmol/L — ABNORMAL LOW (ref 3.5–5.1)
Potassium: 6.2 mmol/L (ref 3.5–5.1)
Potassium: 5.5 mmol/L — ABNORMAL HIGH (ref 3.5–5.1)
Potassium: 5.7 mmol/L — ABNORMAL HIGH (ref 3.5–5.1)
Potassium: 5.5 mmol/L — ABNORMAL HIGH (ref 3.5–5.1)
Sodium: 138 mmol/L (ref 135–143)
Sodium: 131 mmol/L — ABNORMAL LOW (ref 135–143)
Sodium: 130 mmol/L — ABNORMAL LOW (ref 135–143)
Sodium: 129 mmol/L — ABNORMAL LOW (ref 135–143)
Sodium: 130 mmol/L — ABNORMAL LOW (ref 135–143)

## 2016-01-29 LAB — ABG WITH LYTES
A-a Gradient: 193.3 mmHg
Base Excess: -4.8 mmol/L — ABNORMAL LOW (ref ?–2.0)
Carboxyhemoglobin: 0.5 % (ref 0.0–1.5)
FIO2: 60
HCO3 Arterial: 18.9 mmol/L — ABNORMAL LOW (ref 22.0–26.0)
Hemoglobin: 9.4 g/dL — ABNORMAL LOW (ref 12.0–16.0)
Inspiratory Time: 0.6
Ionized Calcium Blood Gas: 1.44 mmol/L — ABNORMAL HIGH (ref 1.14–1.30)
Mean Airway Pressure: 6
Methemoglobin: 0.5 % (ref 0.0–1.0)
Minute Ventilation: 12.7
O2Hb: 98 % — ABNORMAL HIGH (ref 94.0–97.0)
Patient Temperature: 37 C
Peak Inspiratory Pressure: 9 cmH2O
Peep: 5 cmH2O
Potassium Blood Gas: 5.72 mmol/L — ABNORMAL HIGH (ref 3.50–5.10)
Puncture Attempts: 1
Respiratory Rate: 20 b/min
Sodium Blood Gas: 130 mmol/L — ABNORMAL LOW (ref 135.0–143.0)
Tidal Volume: 480 mL
Time Analyzed: 20170819084233
a/A Ratio: 0.48
pCO2 Arterial (Temp Corrected): 30 mmHg
pCO2 Arterial: 29.8 mmHg — ABNORMAL LOW (ref 35.0–45.0)
pH Arterial (Temp Corrected): 7.42
pH Arterial: 7.42 (ref 7.350–7.450)
pO2 Arterial (Temp Corrected): 182 mmHg
pO2 Arterial: 181.9 mmHg — ABNORMAL HIGH (ref 80.0–100.0)
sO2 Arterial: 99 % — ABNORMAL HIGH (ref 92.0–98.5)

## 2016-01-29 LAB — ABG
A-a Gradient: 168 mmHg
Base Excess: -6 mmol/L — ABNORMAL LOW (ref ?–2.0)
Base Excess: -6.2 mmol/L — ABNORMAL LOW (ref ?–2.0)
Carboxyhemoglobin: 1 % (ref 0.0–1.5)
Carboxyhemoglobin: 0.1 % (ref 0.0–1.5)
FIO2: 30
FIO2: 60
HCO3 Arterial: 17.5 mmol/L — ABNORMAL LOW (ref 22.0–26.0)
HCO3 Arterial: 17.6 mmol/L — ABNORMAL LOW (ref 22.0–26.0)
Hemoglobin: 9.5 g/dL — ABNORMAL LOW (ref 12.0–16.0)
Hemoglobin: 9.8 g/dL — ABNORMAL LOW (ref 12.0–16.0)
Methemoglobin: 0.2 % (ref 0.0–1.0)
Methemoglobin: 0.2 % (ref 0.0–1.0)
Minute Ventilation: 11.8
O2Hb: 97.7 % — ABNORMAL HIGH (ref 94.0–97.0)
O2Hb: 99 % — ABNORMAL HIGH (ref 94.0–97.0)
Patient Temperature: 37 C
Peep: 5 cmH2O
Puncture Attempts: 1
Puncture Attempts: 1
Respiratory Rate: 20 b/min
Respiratory Rate: 27 b/min
Tidal Volume: 480 mL
Tidal Volume: 500 mL
Time Analyzed: 20170818183231
Time Analyzed: 20170819040110
a/A Ratio: 0.55
pCO2 Arterial: 27.7 mmHg — ABNORMAL LOW (ref 35.0–45.0)
pCO2 Arterial: 29.2 mmHg — ABNORMAL LOW (ref 35.0–45.0)
pH Arterial: 7.418 (ref 7.350–7.450)
pH Arterial: 7.399 (ref 7.350–7.450)
pO2 Arterial: 147.8 mmHg — ABNORMAL HIGH (ref 80.0–100.0)
pO2 Arterial: 207.9 mmHg — ABNORMAL HIGH (ref 80.0–100.0)
sO2 Arterial: 98.9 % — ABNORMAL HIGH (ref 92.0–98.5)
sO2 Arterial: 99.3 % — ABNORMAL HIGH (ref 92.0–98.5)

## 2016-01-29 LAB — MIXED VENOUS BLOOD GAS
Carboxyhemoglobin: 0.3 %
Carboxyhemoglobin: 0.9 %
FIO2: 60
FIO2: 60
Hemoglobin: 9.8 g/dL
Hemoglobin: 9.3 g/dL
Methemoglobin: 0.2 %
Methemoglobin: 0.8 %
O2Hb: 66.6 %
O2Hb: 65.6 %
Patient Temperature: 37 C
Patient Temperature: 37 C
Peep: 5 cmH2O
Puncture Attempts: 1
Puncture Attempts: 1
Time Analyzed: 20170819040437
Time Analyzed: 20170819084513
pO2 Mixed Venous (Temp Corrected): 37.9 mmHg
pO2 Mixed Venous (Temp Corrected): 34.9 mmHg
pO2 Mixed Venous: 37.9 mmHg (ref 37.0–43.0)
pO2 Mixed Venous: 34.9 mmHg — ABNORMAL LOW (ref 37.0–43.0)
sO2 Mixed Venous: 66.9 % — ABNORMAL LOW (ref 70.0–76.0)
sO2 Mixed Venous: 66.7 % — ABNORMAL LOW (ref 70.0–76.0)

## 2016-01-29 LAB — CBC WITH AUTO DIFFERENTIAL
Basophils %: 1 % (ref 0–2)
Basophils, Absolute: 0.1 10*3/ÂµL (ref 0.0–0.2)
Eosinophils %: 0 % (ref 0–7)
Eosinophils, Absolute: 0 10*3/ÂµL (ref 0.0–0.7)
HCT: 28.1 % — ABNORMAL LOW (ref 37.0–48.0)
Hemoglobin: 8.6 g/dL — ABNORMAL LOW (ref 12.0–16.0)
Lymphocytes %: 5 % — ABNORMAL LOW (ref 25–45)
Lymphocytes, Absolute: 0.9 10*3/ÂµL — ABNORMAL LOW (ref 1.1–4.3)
MCH: 22.1 pg — ABNORMAL LOW (ref 27.0–34.0)
MCHC: 30.7 g/dL — ABNORMAL LOW (ref 32.0–36.0)
MCV: 71.9 fL — ABNORMAL LOW (ref 81.0–99.0)
MPV: 8.9 fL (ref 7.4–10.4)
Monocytes %: 8 % (ref 0–12)
Monocytes, Absolute: 1.5 10*3/ÂµL — ABNORMAL HIGH (ref 0.0–1.2)
Neutrophils %: 86 % — ABNORMAL HIGH (ref 35–70)
Neutrophils, Absolute: 15.6 10*3/ÂµL — ABNORMAL HIGH (ref 1.6–7.3)
Platelet Count: 91 10*3/ÂµL — ABNORMAL LOW (ref 150–400)
Platelet Estimate: DECREASED — AB
RBC: 3.91 10*6/ÂµL — ABNORMAL LOW (ref 4.20–5.40)
RDW: 22.1 % — ABNORMAL HIGH (ref 11.5–14.5)
WBC: 18.1 10*3/ÂµL — ABNORMAL HIGH (ref 4.8–10.8)

## 2016-01-29 LAB — POCT GLUCOSE
POC Glucose: 103 mg/dL — ABNORMAL HIGH (ref 80–99)
POC Glucose: 105 mg/dL — ABNORMAL HIGH (ref 80–99)
POC Glucose: 111 mg/dL — ABNORMAL HIGH (ref 80–99)
POC Glucose: 112 mg/dL — ABNORMAL HIGH (ref 80–99)
POC Glucose: 120 mg/dL — ABNORMAL HIGH (ref 80–99)
POC Glucose: 125 mg/dL — ABNORMAL HIGH (ref 80–99)
POC Glucose: 125 mg/dL — ABNORMAL HIGH (ref 80–99)
POC Glucose: 129 mg/dL — ABNORMAL HIGH (ref 80–99)
POC Glucose: 133 mg/dL — ABNORMAL HIGH (ref 80–99)
POC Glucose: 135 mg/dL — ABNORMAL HIGH (ref 80–99)
POC Glucose: 137 mg/dL — ABNORMAL HIGH (ref 80–99)
POC Glucose: 141 mg/dL — ABNORMAL HIGH (ref 80–99)
POC Glucose: 142 mg/dL — ABNORMAL HIGH (ref 80–99)
POC Glucose: 146 mg/dL — ABNORMAL HIGH (ref 80–99)
POC Glucose: 158 mg/dL — ABNORMAL HIGH (ref 80–99)
POC Glucose: 174 mg/dL — ABNORMAL HIGH (ref 80–99)
POC Glucose: 91 mg/dL (ref 80–99)

## 2016-01-29 LAB — BLOOD CULTURE
Blood Culture Result: NO GROWTH
Blood Culture Result: NO GROWTH

## 2016-01-29 LAB — RESPIRATORY CULTURE
Culture Result: NO GROWTH
Gram Stain: NONE SEEN
Gram Stain: NONE SEEN

## 2016-01-29 LAB — CREATININE, URINE, RANDOM: Creatinine, Urine, Random: 77 mg/dL

## 2016-01-29 LAB — MAGNESIUM
Magnesium: 1.4 mg/dL — ABNORMAL LOW (ref 1.6–2.4)
Magnesium: 2.4 mg/dL (ref 1.6–2.4)

## 2016-01-29 LAB — SODIUM, URINE, RANDOM: Sodium, urine, random: 58 mmol/L

## 2016-01-29 LAB — IONIZED CALCIUM: Ionized Calcium: 1.4 mmol/L — ABNORMAL HIGH (ref 1.12–1.30)

## 2016-01-29 LAB — OSMOLALITY, URINE: Osmolality, Urine: 327 mOsm/kg (ref 50–1200)

## 2016-01-29 MED ORDER — dextrose 50% (D50W) injection 25 g
Freq: Once | INTRAVENOUS | Status: AC
Start: 2016-01-29 — End: 2016-01-29
  Administered 2016-01-29: 13:00:00 via INTRAVENOUS

## 2016-01-29 MED ORDER — famotidine (PF) (PEPCID) 20 mg in 0.9% NaCl IVPB Premix
20 | Freq: Every day | INTRAVENOUS | Status: DC
Start: 2016-01-29 — End: 2016-02-05
  Administered 2016-01-30: 16:00:00 20 mg via INTRAVENOUS
  Administered 2016-01-30: 16:00:00 2050 mg via INTRAVENOUS
  Administered 2016-01-31: 15:00:00 20 mg via INTRAVENOUS
  Administered 2016-01-31: 16:00:00 2050 mg via INTRAVENOUS
  Administered 2016-02-01: 16:00:00 20 mg via INTRAVENOUS
  Administered 2016-02-01 – 2016-02-02 (×2): 2050 mg via INTRAVENOUS
  Administered 2016-02-02 – 2016-02-03 (×2): 20 mg via INTRAVENOUS
  Administered 2016-02-03 – 2016-02-04 (×2): 2050 mg via INTRAVENOUS
  Administered 2016-02-04 – 2016-02-05 (×2): 20 mg via INTRAVENOUS
  Administered 2016-02-05: 18:00:00 2050 mg via INTRAVENOUS

## 2016-01-29 MED ORDER — sodium chloride 0.9 %
INTRAVENOUS | Status: AC
Start: 2016-01-29 — End: 2016-01-29
  Administered 2016-01-29 (×2): via INTRAVENOUS

## 2016-01-29 MED ORDER — midazolam 50 mg in NaCl 0.9 % 50 mL (1 mg/mL) premix infusion
1 | INTRAVENOUS | Status: DC | PRN
Start: 2016-01-29 — End: 2016-01-30
  Administered 2016-01-29 – 2016-01-31 (×6): 1 mg/h via INTRAVENOUS

## 2016-01-29 MED ORDER — enoxaparin (LOVENOX) syringe 40 mg
40 | Freq: Two times a day (BID) | SUBCUTANEOUS | Status: DC
Start: 2016-01-29 — End: 2016-01-30
  Administered 2016-01-29 – 2016-01-30 (×3): 40 mg via SUBCUTANEOUS

## 2016-01-29 MED ORDER — chlorhexidine (PERIDEX) 0.12 % solution 15 mL
0.12 | Freq: Two times a day (BID) | Status: DC
Start: 2016-01-29 — End: 2016-02-05
  Administered 2016-01-29 – 2016-02-04 (×14): 0.12 mL via OROMUCOSAL

## 2016-01-29 MED ORDER — furosemide (LASIX) injection 80 mg
10 | Freq: Once | INTRAMUSCULAR | Status: AC
Start: 2016-01-29 — End: 2016-01-29
  Administered 2016-01-29: 18:00:00 10 mg via INTRAVENOUS

## 2016-01-29 MED ORDER — phenylephrine (NEO-SYNEPHRINE) 40 mg in NaCl 0.9 % 500 mL (80 mcg/mL) infusion **DOUBLE CONCENTRATION**
10 | INTRAMUSCULAR | Status: DC | PRN
Start: 2016-01-29 — End: 2016-02-13
  Administered 2016-01-29 – 2016-01-31 (×25): via INTRAVENOUS

## 2016-01-29 MED ORDER — calcium gluconate 2 g in sodium chloride 0.9 % (NS) 100 mL IVPB
100 | Freq: Once | INTRAVENOUS | Status: AC
Start: 2016-01-29 — End: 2016-01-29
  Administered 2016-01-29 (×2): via INTRAVENOUS

## 2016-01-29 MED ORDER — sodium bicarbonate 8.4 % (1 mEq/mL) syringe 50 mEq
8.4 | Freq: Once | INTRAVENOUS | Status: AC
Start: 2016-01-29 — End: 2016-01-29
  Administered 2016-01-29: 13:00:00 8.4 meq via INTRAVENOUS

## 2016-01-29 MED ORDER — sodium bicarbonate 1 mEq/mL (8.4 %) 150 mEq in dextrose 5 % (D5W) 1,000 mL infusion
8.4 | INTRAVENOUS | Status: DC
Start: 2016-01-29 — End: 2016-01-30
  Administered 2016-01-29 – 2016-01-30 (×2): via INTRAVENOUS

## 2016-01-29 MED ORDER — furosemide (LASIX) injection 80 mg
10 | Freq: Once | INTRAMUSCULAR | Status: AC
Start: 2016-01-29 — End: 2016-01-29
  Administered 2016-01-29: 23:00:00 10 mg via INTRAVENOUS

## 2016-01-29 MED ORDER — dextrose 50% (D50W) injection 12.5-25 g
INTRAVENOUS | Status: DC | PRN
Start: 2016-01-29 — End: 2016-02-15

## 2016-01-29 MED ORDER — albuterol (PROVENTIL) nebulizer solution 2.5 mg
2.5 | RESPIRATORY_TRACT | Status: DC | PRN
Start: 2016-01-29 — End: 2016-01-30
  Administered 2016-01-30 (×3): 2.5 mg via RESPIRATORY_TRACT

## 2016-01-29 MED ORDER — sodium polystyrene WITH SORBITOL (SPS) 15-20 gram/60 mL oral suspension 30 g
15-20 | Freq: Once | ORAL | Status: DC
Start: 2016-01-29 — End: 2016-01-29

## 2016-01-29 MED ORDER — furosemide (LASIX) injection 40 mg
10 | Freq: Once | INTRAMUSCULAR | Status: AC
Start: 2016-01-29 — End: 2016-01-29
  Administered 2016-01-29: 13:00:00 10 mg via INTRAVENOUS

## 2016-01-29 MED ORDER — dextrose 10 % infusion
INTRAVENOUS | Status: DC
Start: 2016-01-29 — End: 2016-02-13

## 2016-01-29 MED ORDER — insulin aspart (NovoLOG) 10 Units in sodium chloride 0.9% (NS PF) 10 mL syringe
100 | Freq: Once | SUBCUTANEOUS | Status: AC
Start: 2016-01-29 — End: 2016-01-29
  Administered 2016-01-29: 13:00:00 via INTRAVENOUS

## 2016-01-29 MED ORDER — furosemide (LASIX) 10 mg/mL injection
10 | INTRAMUSCULAR | Status: AC
Start: 2016-01-29 — End: 2016-01-28
  Administered 2016-01-29: 02:00:00 10 mg/mL via INTRAVENOUS

## 2016-01-29 MED ORDER — furosemide (LASIX) injection 40 mg
10 | Freq: Once | INTRAMUSCULAR | Status: AC
Start: 2016-01-29 — End: 2016-01-28

## 2016-01-29 MED ORDER — sodium polystyrene WITH SORBITOL (SPS) 15-20 gram/60 mL oral suspension 30 g
15-20 | Freq: Once | ORAL | Status: AC
Start: 2016-01-29 — End: 2016-01-29
  Administered 2016-01-29: 18:00:00 15-20 g via ORAL

## 2016-01-29 MED ORDER — levofloxacin (LEVAQUIN) 750 mg in D5W IVPB Premix
750 | INTRAVENOUS | Status: DC
Start: 2016-01-29 — End: 2016-01-31
  Administered 2016-01-29: 20:00:00 750150 mg via INTRAVENOUS
  Administered 2016-01-29: 18:00:00 750 mg via INTRAVENOUS
  Administered 2016-01-31: 20:00:00 750150 mg via INTRAVENOUS
  Administered 2016-01-31: 18:00:00 750 mg via INTRAVENOUS

## 2016-01-29 MED FILL — FUROSEMIDE 10 MG/ML INJECTION SOLUTION: 10 mg/mL | INTRAMUSCULAR | Qty: 8

## 2016-01-29 MED FILL — MILRINONE 20 MG/100 ML(200 MCG/ML) IN 5 % DEXTROSE INTRAVENOUS PIGGYBK: 200 ug/mL | INTRAVENOUS | Qty: 100

## 2016-01-29 MED FILL — PHENYLEPHRINE 10 MG/ML INJECTION SOLUTION: 10 mg/mL | INTRAMUSCULAR | Qty: 4

## 2016-01-29 MED FILL — EPINEPHRINE IN D5W 2 MG/250 ML (8 MCG/ML) PREMIX INFUSION: 2 mg/250 mL (8 mcg/mL) | INTRAVENOUS | Qty: 250

## 2016-01-29 MED FILL — POTASSIUM CHLORIDE 40 MEQ/100 ML IN STERILE WATER INTRAVENOUS PIGGYBACK: 40 meq/dL | INTRAVENOUS | Qty: 100

## 2016-01-29 MED FILL — NOVOLOG FLEXPEN U-100 INSULIN ASPART 100 UNIT/ML (3 ML) SUBCUTANEOUS: 100 unit/mL (3 mL) | SUBCUTANEOUS | Qty: 0.1

## 2016-01-29 MED FILL — DEXMEDETOMIDINE 100 MCG/ML INTRAVENOUS SOLUTION: 100 ug/mL | INTRAVENOUS | Qty: 4

## 2016-01-29 MED FILL — SODIUM BICARBONATE 8.4 % (1 MEQ/ML) INTRAVENOUS SOLUTION: 8.4 % (1 mEq/mL) | INTRAVENOUS | Qty: 150

## 2016-01-29 MED FILL — NOVOLIN R REGULAR U-100 INSULIN 100 UNIT/ML INJECTION SOLUTION: 100 [IU]/mL | INTRAMUSCULAR | Qty: 1.5

## 2016-01-29 MED FILL — FAMOTIDINE (PF) IN 0.9% NACL 20 MG/50 ML IVPB PREMIX: 20 mg/50 mL | INTRAVENOUS | Qty: 50

## 2016-01-29 MED FILL — DEXTROSE 50 % IN WATER (D50W) INTRAVENOUS SOLUTION: 12.5000 g | INTRAVENOUS | Qty: 50

## 2016-01-29 MED FILL — FENTANYL (PF) IN 0.9 % NACL 1250 MCG/250 ML PREMIX INFUSION: 1250 mcg/250 mL (5 mcg/mL) | INTRAVENOUS | Qty: 250

## 2016-01-29 MED FILL — MIDAZOLAM 1 MG/ML IN 0.9 % NACL IVPB PREMIX: 1 mg/mL | INTRAVENOUS | Qty: 50

## 2016-01-29 MED FILL — PHENYLEPHRINE 10 MG/ML INJECTION SOLUTION: 10 mg/mL | INTRAMUSCULAR | Qty: 2

## 2016-01-29 MED FILL — ACETAMINOPHEN 650 MG RECTAL SUPPOSITORY: 650 mg | RECTAL | Qty: 1

## 2016-01-29 MED FILL — MAGNESIUM SULFATE 2 GRAM/50 ML (4 %) IN WATER INTRAVENOUS PIGGYBACK: 2 gram/50 mL (4 %) | INTRAVENOUS | Qty: 50

## 2016-01-29 MED FILL — CALCIUM GLUCONATE 100 MG/ML (10 %) IV VIAL: 100 mg/mL (10%) | INTRAVENOUS | Qty: 20

## 2016-01-29 MED FILL — FUROSEMIDE 10 MG/ML INJECTION SOLUTION: 10 mg/mL | INTRAMUSCULAR | Qty: 4

## 2016-01-29 MED FILL — LEVOFLOXACIN IN D5W 750 MG/150 ML IVPB PREMIX: 750 mg/150 mL | INTRAVENOUS | Qty: 150

## 2016-01-29 MED FILL — SPS (WITH SORBITOL) 15 GRAM-20 GRAM/60 ML ORAL SUSPENSION: 15-20 gram/60 mL | ORAL | Qty: 60

## 2016-01-29 MED FILL — ENOXAPARIN 40 MG/0.4 ML SUBCUTANEOUS SYRINGE: 40 | SUBCUTANEOUS | Qty: 1

## 2016-01-29 MED FILL — SODIUM BICARBONATE 8.4 % (1 MEQ/ML) INTRAVENOUS SYRINGE: 8.4 % (1 mEq/mL) | INTRAVENOUS | Qty: 50

## 2016-01-29 NOTE — Progress Notes (Signed)
cvc dressing  Being changed by rn  Site wnl

## 2016-01-29 NOTE — Progress Notes (Signed)
Critical Care Progress Note:    Subjective: This is a 60 y.o. year-old female with critical symptomatic aortic stenosis who underwent AVR on 8/17. We were consulted for critical care management while she is in the CCU. Her intraoperative LVEF was only 15%. She required the IABP post operatively for cardiogenic shock. Hence extubation has been delayed. She continues to require epinephrine, milrinone and neosynephrine infusions. Her cardiac indices look good on these meds and the IABP has been removed.  She is well sedated with propofol and fentanyl.  Her urine output is dwindling and her Cr is increasing. Hyperkalemia this morning was treated medically.    Medications: Reviewed    Objective:    Physical Exam:  Vitals:    01/29/16 0744   BP: 132/69   Pulse: 110   Resp: (!) 29   Temp: (!) 38.6 ?C (101.5 ?F)   SpO2: 100%     Gen: 60 y.o. yr old well-nourished, well-developed female lying in bed intubated and sedated.  Eyes: PERRL, Lids and Conjunctivae: no discharge or pallor and non-injected. Corneas: grossly intact. Sclerae: non-icteric.   ENMT: orally intubated.  Neck: Supple, No LAD, No JVD, No thyromegaly. RIJ Theone Murdoch.  Respiratory: clear to auscultation. No use of accessory muscles. No wheezes, rhonchi or crackles.  Chest: Symmetric, equal rise with inspiration, no masses or tenderness. Sternotomy wound clean. 2 Chest tubes.  Cardiac: RRR, bilateral peripheral edema, peripheral pulses palpable bilaterally.  Abdm: Soft, no guarding/rigidity/tenderness/rebound tenderness/hepatosplenomegaly  Muskuloskeletal: no edema, no cyanosis, no clubbing. Gait not examined.  Skin: Warm, no obvious lesions or ulcers.   Neuro: sedated    Labs: reviewed    Imaging: reviewed personally and independently    Assessment:    Patient Active Problem List   Diagnosis SNOMED CT(R)   . Heart failure (CMS/HCC) HEART FAILURE   . Elevated brain natriuretic peptide (BNP) level HORMONE LEVEL - FINDING   . NSTEMI (non-ST elevated myocardial  infarction) (CMS/HCC) ACUTE NON-ST SEGMENT ELEVATION MYOCARDIAL INFARCTION   . Demand ischemia of myocardium (CMS/HCC) ACUTE ISCHEMIC HEART DISEASE   . Tachycardia TACHYCARDIA   . Pleural effusion PLEURAL EFFUSION   . Obesity OBESITY   . Acute respiratory failure with hypoxia (CMS/HCC) ACUTE RESPIRATORY FAILURE   . Hypotension LOW BLOOD PRESSURE   . Iron deficiency anemia IRON DEFICIENCY ANEMIA   . Thrombocytosis (CMS/HCC) THROMBOCYTOSIS   . Weight loss WEIGHT DECREASED   . Family history of cancer FAMILY HISTORY OF CANCER   . Cardiomegaly CARDIOMEGALY   . Mediastinal lymphadenopathy MEDIASTINAL LYMPHADENOPATHY   . Hilar lymphadenopathy- bilataeral  HILAR LYMPHADENOPATHY   . Chest pain CHEST PAIN   . Dyspnea and respiratory abnormality DYSPNEA   . Abnormal EKG ELECTROCARDIOGRAM ABNORMAL   . Opacity of lung on imaging study ABNORMAL FINDINGS ON DIAGNOSTIC IMAGING OF LUNG   . Liver lesion- small hypodense lesion around falciform lig LESION OF LIVER   . Aortic stenosis, severe AORTIC VALVE STENOSIS   . Acute systolic heart failure (CMS/HCC) ACUTE SYSTOLIC HEART FAILURE   . Non-ischemic cardiomyopathy (CMS/HCC) CARDIOMYOPATHY   . Respiratory failure (CMS/HCC) RESPIRATORY FAILURE   . S/P AVR (aortic valve replacement) HISTORY OF AORTIC VALVE REPLACEMENT       Plan:    Neurologic: Analgesia and sedation as required.   Pulmonary: Continue mechanical ventilation until hemodynamics stabilize. No SBTs until then. Inhaled Flolan for high pulmonary pressures, better. No major gas exchange issues.  Cardiac: Currently on milrinone, epinephrine and neosynephrine. IABP removed. Continue aspirin. Defer to  primary team.  ID: perioperative cefazolin completed. Now febrile with no obvious source of infection. Panculture. Start Levaquin empirically.  Renal: acute renal failure. Urine output dwindling. Renal duplex showed low flow to kidneys. Nephrology consultation requested for possible renal replacement therapy.  GI: NPO.  H2b.  Endocrine: maintain euglycemia.  Heme: monitor H&H and transfuse as necessary. Monitor thrombocytopenia   Prophylaxis: H2b, Lovenox, SCDs, HOB elevation  ?  Code Status: full    This patient remains at high risk of organ failure, deterioration, and/or death and continues to require critical care management and hence is in a critical care unit, requiring intensive monitoring.     Critical Care Time, excluding billable procedures, greater than: 30 mins    Brandy [name]

## 2016-01-29 NOTE — Consults (Signed)
Nephrology Consult      Name: Brandy [name]  DOB: 1956/02/24 60 y.o.  MRN: [id number]  CSN: [id number]    History:     Reason for Consult:  Dr. Camillo Flaming requests evaluation for AKI.    History of Present Illness:  Subjective: This is a 60 y.o.?year-old female?with a normal baseline creatinine of 0.8 with critical, symptomatic aortic stenosis?who underwent AVR?on 8/17. Her intraoperative LVEF was only 15%. She required the IABP post operatively for cardiogenic shock with extubation delayed. She continues to require inotprope support for cardiogenic shock. This morning her cardiac indices were improved and the IABP has been removed.  Her creatinine has steadily risen over the last 24 hours with creatinine increasing from 1.4 yesterday to 2.7 this AM.  She developed hyperkalemia and was treated medically this am with her serum K currently at 5.7.  She has been oliguric with about 150 ml of urine output over the last 3-4 hours.  She was given IV lasix this AM.  CVP has been in the 13-15 range.    She is following simple commands.    Past Medical History:  Past Medical History:   Diagnosis Date   . History of blood transfusion      Past Surgical History:  Past Surgical History:   Procedure Laterality Date   . PROCEDURE N/A 01/27/2016    Procedure: AORTIC VALVE REPLACEMENT; INTRA AORTIC BALLOON INSERTION;  Surgeon: Otho Bellows, MD;  Location: [facility] OR;  Service: Cardiology;  Laterality: N/A;   . TONSILLECTOMY     . TUBAL LIGATION     . TUBAL LIGATION  1980     Current Medications:  Prior to Admission medications    Medication Sig Start Date End Date Taking? Authorizing Provider   naproxen sodium (ALEVE) 220 MG tablet Take 220 mg by mouth daily as needed.    Historical Provider, MD        Allergies:  Sulfa (sulfonamide antibiotics)    Family History:  Family History   Problem Relation Age of Onset   . Cancer Mother    . Heart disease Father    . Heart disease Sister    . Cancer Brother    . Cancer Maternal Aunt    .  Heart disease Maternal Aunt    . Cancer Maternal Uncle    . Heart disease Maternal Uncle      Social History:  Social History   Substance Use Topics   . Smoking status: Never Smoker   . Smokeless tobacco: Never Used   . Alcohol use No     Immunization:    There is no immunization history on file for this patient.  Review of Systems:  Review of systems not obtained due to patient factors.    Physical Exam:     Vital Signs:  Initial Vitals   BP 01/23/16 1615 111/81   Pulse 01/23/16 1615 107   Resp 01/23/16 1615 25   Temp 01/23/16 1615 36.9 ?C (98.4 ?F)   SpO2 01/23/16 1615 98 %     Exam:  General: in mild distress    Data:     Labs:  Results for orders placed or performed during the hospital encounter of 01/23/16 (from the past 24 hour(s))   POCT Glucose -Next Routine    Collection Time: 01/28/16 10:06 AM   Result Value Ref Range    POC Glucose 138 (H) 80 - 99 mg/dL   POCT Glucose -Next Routine    Collection Time:  01/28/16 12:20 PM   Result Value Ref Range    POC Glucose 191 (H) 80 - 99 mg/dL   POCT Glucose -Next Routine    Collection Time: 01/28/16  1:15 PM   Result Value Ref Range    POC Glucose 200 (H) 80 - 99 mg/dL   POCT Glucose -Next Routine    Collection Time: 01/28/16  3:41 PM   Result Value Ref Range    POC Glucose 115 (H) 80 - 99 mg/dL   Basic Metabolic Panel -Timed    Collection Time: 01/28/16  4:00 PM   Result Value Ref Range    Sodium 132 (L) 135 - 143 mmol/L    Potassium 4.2 3.5 - 5.1 mmol/L    Chloride 105 98 - 111 mmol/L    CO2 - Carbon Dioxide 20.0 (L) 21.0 - 31.0 mmol/L    Glucose 118 (H) 80 - 99 mg/dL    BUN 24 (H) 6 - 23 mg/dL    Creatinine 1.61 (H) 0.55 - 1.10 mg/dL    Calcium 9.7 8.6 - 09.6 mg/dL    Anion Gap 7.0 3.0 - 11.0 mmol/L    GFR Estimate 27 (L) >=60 mL/min/1.11m*2    GFR Additional Info     Magnesium -Timed    Collection Time: 01/28/16  4:00 PM   Result Value Ref Range    Magnesium 2.1 1.6 - 2.4 mg/dL   CBC without Differential -Timed    Collection Time: 01/28/16  4:00 PM   Result Value  Ref Range    WBC 16.8 (H) 4.8 - 10.8 10*3/?L    RBC 3.96 (L) 4.20 - 5.40 10*6/?L    Hemoglobin 8.9 (L) 12.0 - 16.0 g/dL    HCT 04.5 (L) 40.9 - 48.0 %    MCV 71.7 (L) 81.0 - 99.0 fL    MCH 22.4 (L) 27.0 - 34.0 pg    MCHC 31.3 (L) 32.0 - 36.0 g/dL    RDW 81.1 (H) 91.4 - 14.5 %    Platelet Count 113 (L) 150 - 400 10*3/?L    MPV 8.5 7.4 - 10.4 fL   Ionized Calcium -Timed    Collection Time: 01/28/16  4:00 PM   Result Value Ref Range    Ionized Calcium 1.39 (H) 1.12 - 1.30 mmol/L   POCT Glucose -Next Routine    Collection Time: 01/28/16  5:11 PM   Result Value Ref Range    POC Glucose 103 (H) 80 - 99 mg/dL   Blood Gas-Arterial -PRN    Collection Time: 01/28/16  6:30 PM   Result Value Ref Range    ABG PH (ASA) 7.418 7.350 - 7.450    PCO2 Arterial Blood Gas 27.7 (L) 35.0 - 45.0 mmHg    pO2 Arterial Blood Gas 147.8 (H) 80.0 - 100.0 mmHg    HCO3 Arterial Blood Gas 17.5 (L) 22.0 - 26.0 mmol/L    Base Excess -6.0 (L) -2.0 - 2.0 mmol/L    tHb 9.5 (L) 12.0 - 16.0 g/dL    N8GN 56.2 (H) 13.0 - 97.0 %    Carboxyhemoglobin 1.0 0.0 - 1.5 %    Methemoglobin 0.2 0.0 - 1.0 %    O2 Saturation 98.9 (H) 92.0 - 98.5 %    pH (Temp Corrected) - Arterial      pCO2 (Temp Corrected) - Arterial  mmHg    pO2 (Temp Corrected) - Arterial  mmHg    Allen's Test NA     Sample Type Blood  Specimen Site AL     O2 Delivery Device Ventilator     FIO2 30     Ventilator Mode Pressure Control A/C     Tidal Volume 480.0 mL    Respiratory Rate 20.0 b/min    Drawn By RN     Puncture Attempts 1     Time Analyzed [id number]     Analyzed By Lin Landsman     Blood Gas Comment pt rr 27    POCT Glucose -Next Routine    Collection Time: 01/28/16  6:30 PM   Result Value Ref Range    POC Glucose 111 (H) 80 - 99 mg/dL   Sodium, Urine, Random -Next Routine    Collection Time: 01/28/16  6:36 PM   Result Value Ref Range    Sodium, urine, random 58 No reference range available for this random urine collection. Reference range is available for a 24 hr collection. mmol/L     Creatinine, Urine, Random -Next Routine    Collection Time: 01/28/16  6:36 PM   Result Value Ref Range    Creatinine, Urine, Random 77 No reference range available for this random urine collection. Reference range is available for a 24 hr collection. mg/dL   Osmolality, urine -Next Routine    Collection Time: 01/28/16  6:36 PM   Result Value Ref Range    Osmolality, Urine 327 50 - 1200 mOsm/kg   POCT Glucose -Next Routine    Collection Time: 01/28/16  7:58 PM   Result Value Ref Range    POC Glucose 125 (H) 80 - 99 mg/dL   POCT Glucose -Next Routine    Collection Time: 01/28/16  9:00 PM   Result Value Ref Range    POC Glucose 133 (H) 80 - 99 mg/dL   Magnesium -Timed    Collection Time: 01/28/16  9:58 PM   Result Value Ref Range    Magnesium 1.4 (L) 1.6 - 2.4 mg/dL   Basic Metabolic Panel -Timed    Collection Time: 01/28/16  9:58 PM   Result Value Ref Range    Sodium 138 135 - 143 mmol/L    Potassium 3.4 (L) 3.5 - 5.1 mmol/L    Chloride 118 (H) 98 - 111 mmol/L    CO2 - Carbon Dioxide 13.4 (L) 21.0 - 31.0 mmol/L    Glucose 107 (H) 80 - 99 mg/dL    BUN 19 6 - 23 mg/dL    Creatinine 1.91 (H) 0.55 - 1.10 mg/dL    Calcium 6.8 (LCrit) 8.6 - 10.3 mg/dL    Anion Gap 6.6 3.0 - 11.0 mmol/L    GFR Estimate 37 (L) >=60 mL/min/1.16m*2    GFR Additional Info     POCT Glucose -Next Routine    Collection Time: 01/28/16 10:03 PM   Result Value Ref Range    POC Glucose 135 (H) 80 - 99 mg/dL   POCT Glucose -Next Routine    Collection Time: 01/28/16 11:58 PM   Result Value Ref Range    POC Glucose 125 (H) 80 - 99 mg/dL   POCT Glucose -Next Routine    Collection Time: 01/29/16  1:58 AM   Result Value Ref Range    POC Glucose 105 (H) 80 - 99 mg/dL   Basic Metabolic Panel -Daily    Collection Time: 01/29/16  3:45 AM   Result Value Ref Range    Sodium 131 (L) 135 - 143 mmol/L    Potassium 6.2 (HCrit) 3.5 - 5.1 mmol/L  Chloride 105 98 - 111 mmol/L    CO2 - Carbon Dioxide 17.6 (L) 21.0 - 31.0 mmol/L    Glucose 138 (H) 80 - 99 mg/dL    BUN  27 (H) 6 - 23 mg/dL    Creatinine 1.61 (H) 0.55 - 1.10 mg/dL    Calcium 9.8 8.6 - 09.6 mg/dL    Anion Gap 8.4 3.0 - 11.0 mmol/L    GFR Estimate 20 (L) >=60 mL/min/1.62m*2    GFR Additional Info     CBC with Auto Differential -Daily    Collection Time: 01/29/16  3:45 AM   Result Value Ref Range    WBC 18.1 (H) 4.8 - 10.8 10*3/?L    RBC 3.91 (L) 4.20 - 5.40 10*6/?L    Hemoglobin 8.6 (L) 12.0 - 16.0 g/dL    HCT 04.5 (L) 40.9 - 48.0 %    MCV 71.9 (L) 81.0 - 99.0 fL    MCH 22.1 (L) 27.0 - 34.0 pg    MCHC 30.7 (L) 32.0 - 36.0 g/dL    RDW 81.1 (H) 91.4 - 14.5 %    Platelet Count 91 (L) 150 - 400 10*3/?L    MPV 8.9 7.4 - 10.4 fL    Neutrophils % 86 (H) 35 - 70 %    Lymphocytes % 5 (L) 25 - 45 %    Monocytes % 8 0 - 12 %    Eosinophils % 0 0 - 7 %    Basophils % 1 0 - 2 %    Neutrophils, Absolute 15.6 (H) 1.6 - 7.3 10*3/?L    Lymphocytes, Absolute 0.9 (L) 1.1 - 4.3 10*3/?L    Monocytes, Absolute 1.5 (H) 0.0 - 1.2 10*3/?L    Eosinophils, Absolute 0.0 0.0 - 0.7 10*3/?L    Basophils, Absolute 0.1 0.0 - 0.2 10*3/?L    Differential Type Smear Review     Platelet Estimate Decreased (A) Normal    Acanthocytes 1+ (A) (none)    Anisocytosis 3+ (A) (none)    Hypochromasia 2+ (A) (none)    Microcytes 2+ (A) (none)    Ovalocytes 1+ (A) (none)    Poikilocytosis 1+ (A) (none)    Polychromasia 1+ (A) (none)    Schistocytes 1+ (A) (none)    Tear Drop Cells 1+ (A) (none)   Magnesium -Next Routine    Collection Time: 01/29/16  3:45 AM   Result Value Ref Range    Magnesium 2.4 1.6 - 2.4 mg/dL   Ionized Calcium -Next Routine    Collection Time: 01/29/16  3:45 AM   Result Value Ref Range    Ionized Calcium 1.40 (H) 1.12 - 1.30 mmol/L   Blood Gas-Arterial -Daily    Collection Time: 01/29/16  3:46 AM   Result Value Ref Range    ABG PH (ASA) 7.399 7.350 - 7.450    PCO2 Arterial Blood Gas 29.2 (L) 35.0 - 45.0 mmHg    pO2 Arterial Blood Gas 207.9 (H) 80.0 - 100.0 mmHg    HCO3 Arterial Blood Gas 17.6 (L) 22.0 - 26.0 mmol/L    Base Excess -6.2 (L) -2.0  - 2.0 mmol/L    tHb 9.8 (L) 12.0 - 16.0 g/dL    N8GN 56.2 (H) 13.0 - 97.0 %    Carboxyhemoglobin 0.1 0.0 - 1.5 %    Methemoglobin 0.2 0.0 - 1.0 %    O2 Saturation 99.3 (H) 92.0 - 98.5 %    a/A Ratio 0.55     ABG AA GRADIENT (ASA) 168.0  mmHg    Patient Temperature 37.0 C    Allen's Test NA     Sample Type Blood     Specimen Site AL     O2 Delivery Device Ventilator     FIO2 60     Ventilator Mode PRVC     Minute Ventilation 11.8     Tidal Volume 500.0 mL    Respiratory Rate 27.0 b/min    Peep 5.0 cmH2O    Drawn By rn     Puncture Attempts 1     Time Analyzed [id number]     Analyzed By Hulda Marin    Blood Gas-Mixed Venous -PRN    Collection Time: 01/29/16  3:48 AM   Result Value Ref Range    pO2 Mixed Venous 37.9 37.0 - 43.0 mmHg    pO2 (Temp Corrected) - Mixed Venous 37.9 mmHg    tHb 9.8 g/dL    T0ZS 01.0 %    Carboxyhemoglobin 0.3 %    Methemoglobin 0.2 %    sO2 66.9 (L) 70.0 - 76.0 %    Patient Temperature 37.0 C    Allen's Test NA     Sample Type Blood     Specimen Site Pulmonary Artery     O2 Delivery Device Ventilator     FIO2 60     Ventilator Mode PRVC     Peep 5.0 cmH2O    Drawn By rn     Puncture Attempts 1     Time Analyzed [id number]     Analyzed By Hulda Marin    POCT Glucose -Next Routine    Collection Time: 01/29/16  3:59 AM   Result Value Ref Range    POC Glucose 142 (H) 80 - 99 mg/dL   POCT Glucose -Next Routine    Collection Time: 01/29/16  6:20 AM   Result Value Ref Range    POC Glucose 174 (H) 80 - 99 mg/dL   Basic Metabolic Panel -Timed    Collection Time: 01/29/16  6:48 AM   Result Value Ref Range    Sodium 130 (L) 135 - 143 mmol/L    Potassium 5.5 (H) 3.5 - 5.1 mmol/L    Chloride 104 98 - 111 mmol/L    CO2 - Carbon Dioxide 16.2 (L) 21.0 - 31.0 mmol/L    Glucose 164 (H) 80 - 99 mg/dL    BUN 29 (H) 6 - 23 mg/dL    Creatinine 2.70 (H) 0.55 - 1.10 mg/dL    Calcium 62.3 (H) 8.6 - 10.3 mg/dL    Anion Gap 9.8 3.0 - 11.0 mmol/L    GFR Estimate 18 (L) >=60 mL/min/1.48m*2    GFR  Additional Info     POCT Glucose -Next Routine    Collection Time: 01/29/16  7:02 AM   Result Value Ref Range    POC Glucose 158 (H) 80 - 99 mg/dL   POCT Glucose -Next Routine    Collection Time: 01/29/16  8:35 AM   Result Value Ref Range    POC Glucose 129 (H) 80 - 99 mg/dL   Blood Gas-Arterial w/Lytes -Once    Collection Time: 01/29/16  8:37 AM   Result Value Ref Range    ABG PH (ASA) 7.420 7.350 - 7.450    PCO2 Arterial Blood Gas 29.8 (L) 35.0 - 45.0 mmHg    pO2 Arterial Blood Gas 181.9 (H) 80.0 - 100.0 mmHg    HCO3 Arterial Blood Gas 18.9 (L) 22.0 - 26.0 mmol/L  Base Excess -4.8 (L) -2.0 - 2.0 mmol/L    Sodium (Blood Gas) 130.0 (L) 135.0 - 143.0 mmol/L    Potassium (Blood Gas) 5.72 (H) 3.50 - 5.10 mmol/L    Ionized Calcium 1.44 (H) 1.14 - 1.30 mmol/L    tHb 9.4 (L) 12.0 - 16.0 g/dL    G9FA 21.3 (H) 08.6 - 97.0 %    Carboxyhemoglobin 0.5 0.0 - 1.5 %    Methemoglobin 0.5 0.0 - 1.0 %    O2 Saturation 99.0 (H) 92.0 - 98.5 %    a/A Ratio 0.48     ABG AA GRADIENT (ASA) 193.3 mmHg    pH (Temp Corrected) - Arterial 7.42     pCO2 (Temp Corrected) - Arterial 30 mmHg    pO2 (Temp Corrected) - Arterial 182 mmHg    Patient Temperature 37.0 C    Allen's Test NA     Sample Type Blood     Specimen Site AL     O2 Delivery Device Ventilator     FIO2 60     Ventilator Mode PRVC     Minute Ventilation 12.7     Tidal Volume 480.0 mL    Respiratory Rate 20.0 b/min    Peep 5.0 cmH2O    Mean Airway Pressure 6     Peak Inspiratory Pressure 9.0 cmH2O    Inspiratory Time 0.6     Drawn By RN     Puncture Attempts 1     Time Analyzed [id number]     Analyzed By Thea Gist     Blood Gas Comment Total RR=28 at time of ABG    Blood Gas-Mixed Venous -PRN    Collection Time: 01/29/16  8:37 AM   Result Value Ref Range    pO2 Mixed Venous 34.9 (L) 37.0 - 43.0 mmHg    pO2 (Temp Corrected) - Mixed Venous 34.9 mmHg    tHb 9.3 g/dL    L2GM 01.0 %    Carboxyhemoglobin 0.9 %    Methemoglobin 0.8 %    sO2 66.7 (L) 70.0 - 76.0 %    Patient  Temperature 37.0 C    Allen's Test NA     Sample Type Blood     Specimen Site Pulmonary Artery     O2 Delivery Device Ventilator     FIO2 60     Drawn By RN     Puncture Attempts 1     Time Analyzed [id number]     Analyzed By Thea Gist    Basic Metabolic Panel -Next Routine    Collection Time: 01/29/16  8:54 AM   Result Value Ref Range    Sodium 129 (L) 135 - 143 mmol/L    Potassium 5.7 (H) 3.5 - 5.1 mmol/L    Chloride 105 98 - 111 mmol/L    CO2 - Carbon Dioxide 18.9 (L) 21.0 - 31.0 mmol/L    Glucose 134 (H) 80 - 99 mg/dL    BUN 29 (H) 6 - 23 mg/dL    Creatinine 2.59 (H) 0.55 - 1.10 mg/dL    Calcium 56.3 8.6 - 87.5 mg/dL    Anion Gap 5.1 3.0 - 11.0 mmol/L    GFR Estimate 18 (L) >=60 mL/min/1.62m*2    GFR Additional Info     POCT Glucose -Next Routine    Collection Time: 01/29/16  9:51 AM   Result Value Ref Range    POC Glucose 137 (H) 80 - 99 mg/dL  Imaging for last 24 hours:  X-ray Chest Pa Or Ap    Result Date: 01/29/2016  IMPRESSION:  1. Slightly more dense consolidation in the LEFT base than on yesterday's study. 2. Lines and tubes as above.     X-ray Chest Pa Or Ap    Result Date: 01/28/2016  IMPRESSION:  Intra-aortic balloon pump has been advanced and the tip is now just below the aortic arch. Remainder of the exam is unchanged.     X-ray Chest Pa Or Ap    Result Date: 01/28/2016  IMPRESSION:  Intra-aortic balloon pump has been pulled back approximately 4 cm. Remainder of the exam is unchanged lines and tubes unchanged, no pneumothorax, and findings compatible with congestive heart failure/fluid overload pattern.     Assessment:   --AKI:  Likely ATN due to cardiogenic shock.  Renal US formal read pending. Some concern for left renal vein thrombosis, but study apparently poor quality.  Hemodynamics seem to be improving.  Her CVP is reasonable.  Only possible indication for renal replacement therapy at this time is hyperkalemia.  Volume status currently acceptable. However, she is at high risk to  develop worsening fluid overload and uremia over the next 24 hours requiring renal replacement therapy.  Her tenuous hemodynamic status would mandate CRRT.    --Cardiogenic Shock: Improving  --S/P AVR   --Hyperkalemia  --Metabolic Acidosis: NAAG. Mild, due to AKI  --Fever: Source unclear. Blood cultures pending        Diagnoses:  Active Hospital Problems    Diagnosis SNOMED CT(R) Date Noted   . S/P AVR (aortic valve replacement) HISTORY OF AORTIC VALVE REPLACEMENT 01/27/2016   . Respiratory failure (CMS/HCC) RESPIRATORY FAILURE 01/23/2016   . Aortic stenosis, severe AORTIC VALVE STENOSIS 01/22/2016   . NSTEMI (non-ST elevated myocardial infarction) (CMS/HCC) ACUTE NON-ST SEGMENT ELEVATION MYOCARDIAL INFARCTION 01/21/2016   . Demand ischemia of myocardium (CMS/HCC) ACUTE ISCHEMIC HEART DISEASE 01/21/2016   . Iron deficiency anemia IRON DEFICIENCY ANEMIA 01/21/2016   . Obesity OBESITY 01/21/2016       Plan:   Plan:  Closely follow for indications for renal replacement  BMP this afternoon  NS 1 Liter over 4 hours  Lasix 80 mg IV X 1 to promote kaliureisis  Kayexalate though OGT today  Possible CRRT later today    Elby Showers  01/29/2016  10:02 AM

## 2016-01-29 NOTE — Procedures (Signed)
OPERATIVE NOTE    DATE: 01/29/2016    STAFF: Otho Bellows MD.    PREOPERATIVE DIAGNOSES:    1. Post-Cardiotomy Cardiogenic Shock    POSTOPERATIVE DIAGNOSES: same as above    OPERATIVE PROCEDURE:  1. Intra-aortic balloon pump removal    INDICATIONS FOR PROCEDURE: The patient is a 60 y.o.  female who presented with critical AS and underwent AVR.  she unfortunately had cardiogenic shock and required IABP to disconnect from cardio-pulmonary bypass.  He heart has recovered to a point that IABP is no longer needed.    DETAILS OF THE PROCEDURE:   The patient was positioned supine. The right groin was prepped and draped in sterile fashion. The balloon computer was disconnected. The 30cc balloon was manually evacuated with a large syringe. The intra aortic balloon was pulled out from the right femoral artery and pressure applied. Pulses palpable bilateral.      Otho Bellows MD  Cardiothoracic Surgery

## 2016-01-29 NOTE — Progress Notes (Signed)
CARDIOTHORACIC SURGERY CRITICAL CARE NOTE     Name: Brandy [name]  DOB: 1955/10/30 60 y.o.  MRN: [id number]  CSN: [id number]    Brandy [name] is a 60 y.o. female patient. Patient was admitted with Respiratory failure (CMS/HCC)  Who had Procedure(s):  AORTIC VALVE REPLACEMENT; INTRA AORTIC BALLOON INSERTION.  2 Days Post-Op    HPI: The patient is a 60 y.o. female with the below medical co-morbid conditions.    ?  1. Severe symptomatic aortic stenosis                        Ejection fraction 30%     Found on TEE intra-op to be 15%                        Normal coronaries                        Predominant symptom dyspnea                     2. Severe Pulmonary Hypertension  3. Mild to Moderate Mitral regurgitation  4. Morbid obesity                        Weight loss 50 pounds in the last one year  5. respiratory insufficiency  6. Non-ST elevation MI                        The demand ischemia  7. Iron deficiency anemia                        CT chest abdomen and pelvis reviewed no abdominal masses                        Likely AVM secondary to aortic stenosis not a good candidate for sedation and EGD or colonoscopy well aortic stenosis is so critical  ?  Patient's a 60 year old female who presented the [facility] in respiratory distress unable to breathe.  There are her troponin was elevated she underwent oxygen therapy and was given Lasix.  Cardiac workup showed normal coronary arteries with depressed LV function on echo and severe aortic stenosis valve area less than 0.4 velocity 4.4 m/s with an EF 30%.  She was transfused 2 units of PRBCs A [facility] hospital with a hemoglobin 8.8.  His immobility volume overload from heart failure  ?  Patient hasn't been to the doctor in years she hasn't taken any medications nor does she take good care of herself.  [place] she is unable to consistently take meds.  She likes to use and asked to [employer] wood to heat her house.    ?  After a few  days of Lasix patient states that she was breathing much better she is 60% better per her suggestion.  I had a long talk with the family about the disease course why she was getting short of breath and discussion of heart failure.  They understand that she is given a need aortic valve replacement she wishes to have tissue valve I do think she is a better surgical candidate and have her given her age and risk.  ?  She has very poor dentition    POD#1  Patient has been  stable on balloon 1:2   Methylene blue last night SVR much better   Coronary perfusion good   Today I am managing acute heart failure with management of the IABP.    Postop day #2  removal of the intra-aortic balloon pump renal ultrasound completed showing minimal flow to the kidneys  patient still making urine      Subjective: pt doing well     Assessment & Plan:     Active Hospital Problems    Diagnosis SNOMED CT(R) Date Noted   . S/P AVR (aortic valve replacement) HISTORY OF AORTIC VALVE REPLACEMENT 01/27/2016   . Respiratory failure (CMS/HCC) RESPIRATORY FAILURE 01/23/2016   . Aortic stenosis, severe AORTIC VALVE STENOSIS 01/22/2016   . NSTEMI (non-ST elevated myocardial infarction) (CMS/HCC) ACUTE NON-ST SEGMENT ELEVATION MYOCARDIAL INFARCTION 01/21/2016   . Demand ischemia of myocardium (CMS/HCC) ACUTE ISCHEMIC HEART DISEASE 01/21/2016   . Iron deficiency anemia IRON DEFICIENCY ANEMIA 01/21/2016   . Obesity OBESITY 01/21/2016     Neurologic:  Sedated    On sedation vacation followed commands  Cardiac: Pre-op EF:   EF 15% prior to surgery   Post op 30% with good valve function 23mm valve     1. Inotropes EPI/Milrinone Continue for heart function support   2. Pressors:   I would like to remove vasopressin today and start Neo-Synephrine for pressure support  Goal map 75-85       3. Rhythm: Normal sinus rhythum with intact AVN   4. Aspirin: 300 rectal   5. Beta-Blocker: hold   6. Statin: hold   7. ACE/ARB: hold        Respiratory:    Smoker  none   Pulmonary toilet    Xray: Removed balloon pump  Pulmonary vasoconstriction     Gastrointestinal:    Genitourinary: creat: pre op 0.9   Now 1.16 >2.6    Infectious Disease:  WBC: 17>18    Hematology: HGB 9.8> 8.6   PLTS 180> 91 postop day 2 with balloon pump I am not concerned about Hitt  Endocrine: Glucose management   Electrolytes: replacing Mg/K/Ca  Prophylaxis: Lovenox twice a day PPI/H2 Blocker     Scheduled Medications:   . aspirin  300 mg Rectal Daily   . chlorhexidine  15 mL Mouth/Throat Q12H   . enoxaparin (LOVENOX) subcutaneous injection  40 mg Subcutaneous 2 times per day   . famotidine  20 mg Intravenous 2 times per day   . phenylephrine       . phenylephrine       . sodium chloride 0.9 % (NS) syringe  10 mL Intravenous Q8H SCH     Infusions:   . clevidipine     . dexmedetomidine 1.2 mcg/kg/hr (01/29/16 0631)   . dextrose 10% (D10W)     . dextrose 5% lactated ringers 30 mL/hr at 01/28/16 2105   . DOBUTamine (DOBUTREX) 250 mg in D5W 250 mL (1000 mcg/mL) infusion     . fentanyl 100 mcg/hr (01/29/16 0422)   . insulin (HUMAN R) 150 Units in NaCl 0.9 % 150 mL (1 unit/mL) infusion - cardiac surgery Stopped (01/29/16 0159)   . milrinone 0.4 mcg/kg/min (01/29/16 0535)   . nitroglycerin     . potassium chloride Stopped (01/29/16 0438)   . propofol Stopped (01/27/16 1946)   . vasopressin (PITRESSIN) 50 units in NaCl 0.9 % 250 mL (0.2 units/mL) infusion **ADULT SHOCK** Stopped (01/28/16 1711)     Recent Labs:  Lab Results   Component  Value Date    NA 130 (L) 01/29/2016    K 5.5 (H) 01/29/2016    K 3.69 01/23/2016    CL 104 01/29/2016    CL 104.1 01/23/2016    CO2 16.2 (L) 01/29/2016    CO2 20 (L) 01/23/2016    GLU 164 (H) 01/29/2016    GLU 85 01/23/2016    BUN 29 (H) 01/29/2016    CREATININES 2.64 (H) 01/29/2016    CALCIUM 10.4 (H) 01/29/2016    AST 28 01/26/2016    ALT 26 01/26/2016    ALKPHOS 72 01/26/2016    BILITOT 0.90 01/21/2016    ALBUMIN 3.3 (L) 01/26/2016    GLOB 3.3 01/26/2016    AGRATIO 1.0  01/26/2016    ANIONGAP 9.8 01/29/2016    ANIONGAP 21.0 (H) 01/23/2016    LABGLOM 18 (L) 01/29/2016    LABGLOM >60.0 01/23/2016    WHITEBLOODCE 18.1 (H) 01/29/2016    LABPLAT 91 (L) 01/29/2016    HGB 8.6 (L) 01/29/2016    PROTIME 18.7 (H) 01/26/2016    INR 1.7 (H) 01/26/2016    APTT 28 01/26/2016          Vital Signs:      ? Cardiac output CCO: 5.7 L/Min    ? Cardiac Index CCI: 2.7 L/min  Temp: (!) 38.6 ?C (101.5 ?F) BP: 132/69 Pulse: 110 Resp: (!) 29 SpO2: 100 % on   Ventilator   Intake/Output:     CT OUTPUT: 500>525    URINE OUTPUT: 1400>466    Exam:  Neuro:no TIA or stroke-like symptoms  Cardiac:regular rate and rhythm, S1, S2 normal, no murmur, click, rub or gallop  Lungs;clear  ABDOMEN:abdomen is soft without significant tenderness, masses, organomegaly or guarding  EXTREMITIES:less then 2 second capillary refill    PLAN:   Neurologically patient does move all extremities  Cardiovascular intra-aortic balloon pump removal today  Vascular checks to lower extremities  Continue on a ventilator PA pressures better  Would like a map of at least 75  Renal ultrasound completed appears to be low flow to the kidneys   Starting Lovenox twice a day despite platelets being low high risk for DVT      Critical Care Time: 40 minutes outside any procedures and family discussion.  Patient remains critical and at high risk for organ failure and death.      Otho Bellows

## 2016-01-29 NOTE — Progress Notes (Signed)
PHARMACIST CRITICAL CARE PROGRESS NOTE     Consult Per P&T Protocol: Critical Care Physician Service, Antimicrobial Interventions, Renal Dosing, IV to PO         Summary: Brandy [name] is [name] admitted on 01/23/2016 with a NSTEMI s/p R/L heart craterization.     Subjective: I visited the patient, but was unable to explain the pharmacist plan of care to the patient due to sedation and/or intubation.    Allergies: Sulfa (sulfonamide antibiotics)    PMH:  has a past medical history of History of blood transfusion.     Surgical history:  has a past surgical history that includes Tonsillectomy; Tubal ligation; Tubal ligation (1980); and ocedure (N/A, 01/27/2016).    Active problems:   Patient Active Problem List    Diagnosis SNOMED CT(R) Date Noted   . S/P AVR (aortic valve replacement) HISTORY OF AORTIC VALVE REPLACEMENT 01/27/2016   . Non-ischemic cardiomyopathy (CMS/HCC) CARDIOMYOPATHY 01/23/2016   . Respiratory failure (CMS/HCC) RESPIRATORY FAILURE 01/23/2016   . Aortic stenosis, severe AORTIC VALVE STENOSIS 01/22/2016   . Acute systolic heart failure (CMS/HCC) ACUTE SYSTOLIC HEART FAILURE 01/22/2016   . Heart failure (CMS/HCC) HEART FAILURE 01/21/2016   . Elevated brain natriuretic peptide (BNP) level HORMONE LEVEL - FINDING 01/21/2016   . NSTEMI (non-ST elevated myocardial infarction) (CMS/HCC) ACUTE NON-ST SEGMENT ELEVATION MYOCARDIAL INFARCTION 01/21/2016   . Demand ischemia of myocardium (CMS/HCC) ACUTE ISCHEMIC HEART DISEASE 01/21/2016   . Tachycardia TACHYCARDIA 01/21/2016   . Pleural effusion PLEURAL EFFUSION 01/21/2016   . Obesity OBESITY 01/21/2016   . Acute respiratory failure with hypoxia (CMS/HCC) ACUTE RESPIRATORY FAILURE 01/21/2016   . Hypotension LOW BLOOD PRESSURE 01/21/2016   . Iron deficiency anemia IRON DEFICIENCY ANEMIA 01/21/2016   . Thrombocytosis (CMS/HCC) THROMBOCYTOSIS 01/21/2016   . Weight loss WEIGHT DECREASED 01/21/2016   . Family history of cancer FAMILY HISTORY OF CANCER  01/21/2016   . Cardiomegaly CARDIOMEGALY 01/21/2016   . Mediastinal lymphadenopathy MEDIASTINAL LYMPHADENOPATHY 01/21/2016   . Hilar lymphadenopathy- bilataeral  HILAR LYMPHADENOPATHY 01/21/2016   . Chest pain CHEST PAIN 01/21/2016   . Dyspnea and respiratory abnormality DYSPNEA 01/21/2016   . Abnormal EKG ELECTROCARDIOGRAM ABNORMAL 01/21/2016   . Opacity of lung on imaging study ABNORMAL FINDINGS ON DIAGNOSTIC IMAGING OF LUNG 01/21/2016   . Liver lesion- small hypodense lesion around falciform lig LESION OF LIVER 01/21/2016       OBJECTIVE:     Diet Orders:   Procedures   . Diet, NPO       Height: 167.6 cm (66) Admit weight: 101.4 kg (223 lb 8.7 oz) Last charted weight: 113.4 kg (250 lb) IBW: 59.3 kg Body mass index is 40.35 kg/(m^2).    I/O last 3 completed shifts:  In: 7217 [I.V.:4642; Blood:225; IV Piggyback:2350]  Out: 1611 [Urine:881; Emesis/NG output:30; Chest Tube:700]    Recent Labs      01/26/16   2002   01/29/16   0345  01/29/16   0648  01/29/16   0854   NA  135   < >  131*  130*  129*   K  4.3   < >  6.2*  5.5*  5.7*   CL  101   < >  105  104  105   CO2  21.7   < >  17.6*  16.2*  18.9*   GLU  132*   < >  138*  164*  134*   BUN  20   < >  27*  29*  29*   CREATININES  0.78   < >  2.45*  2.64*  2.73*   CALCIUM  10.3   < >  9.8  10.4*  10.2   AST  28   --    --    --    --    ALT  26   --    --    --    --    ALBUMIN  3.3*   --    --    --    --     < > = values in this interval not displayed.     Recent Labs      01/27/16   2153  01/28/16   1600  01/29/16   0345   CAION  1.32*  1.39*  1.40*     Recent Labs      01/28/16   1600  01/28/16   2158  01/29/16   0345   MG  2.1  1.4*  2.4     Recent Labs      01/29/16   0620  01/29/16   0702  01/29/16   0835  01/29/16   0951  01/29/16   1110  01/29/16   1221   POCGLU  174*  158*  129*  137*  146*  141*     Recent Labs      01/26/16   2002   01/28/16   0344  01/28/16   1600  01/29/16   0345   WHITEBLOODCE  13.9*   < >  17.1*  16.8*  18.1*   NEUTROABS  10.5*   --    14.2*   --   15.6*   HGB  9.5*   < >  9.8*  8.9*  8.6*   HCT  30.3*   < >  31.8*  28.4*  28.1*   LABPLAT  407*   < >  180  113*  91*    < > = values in this interval not displayed.       Temp (24hrs), Avg:38.2 ?C (100.7 ?F), Min:37.7 ?C (99.9 ?F), Max:38.7 ?C (101.7 ?F)      Relevant Microbiology:  Date: Source: Results:  Status:   8/19 Blood x 4 NGTD Pending   8/19 ET Asp  Sent     ASSESSMENT:     Estimated CrCl: 28 mL/min (CG AdjBW) per [employer] [facility]    BUN/SCr ratio: 10.6    Critical Care Prophylaxis:  VAP: Peridex 0.12% 15 mL BID   VTE: Lovenox 40 mg SubQ BID (CTS dosing)   SUP: Pepcid 20 mg IV daily     CBG Monitoring:   ? CBG goal in critically ill: 140-180  ? CBG in past 24H: 107-174    Propofol/Triglyceride Monitoring:  ? Not on propofol    Antimicrobial Monitoring:   Indication for antimicrobials: Sepsis of unknown origin (per Intensivist 8/19)   Current antimicrobials: Levaquin 750 mg IV Q48H (8/19-current)   Relevant previous antimicrobials:    # of days of therapy directed at source of infection or pathogen: Day 1   Comments about duration? Dependent on source  General sepsis: 7-10 days (Surviving Sepsis)   Notes about antimicrobial therapy or microbiology data?        PLAN:     Critical Care Prophylaxis:  ? Prophylactic medications ordered     Antimicrobial Interventions:   ? Levaquin started empirically for sepsis  ? Will follow  Renal Dosing:   ? Change Pepcid to 20 mg IV daily for CrCl range < 50 mL/min      IV to PO:   ? No switches at the moment       Signed by: Carylon Perches, PharmD, BCPS, BCCCP

## 2016-01-29 NOTE — Progress Notes (Signed)
Vascular Tech Report:    1) Technically difficult exam due to chest tube and bandaging.   2) Significantly poor renal flow throughout both kidneys RI's are non-diagnostic.   3) No flow obtained bilateral renal arteries c/w renal failure.   4) Bilateral renal veins appear dilated with no flow on the Lt c/w possible thrombosis and pulsatile flow within the Rt.   5) The SMV appears patent, however the splenic vein appears thrombosed.   6) The IVC is patent.   7) No evidence of AAA.   8) RN: Dorathy Daft was notified of these findings at 08:00.

## 2016-01-29 NOTE — Progress Notes (Signed)
END OF SHIFT SUMMARY:      SENSORIUM:  Pt intubated and sedated, on ballon pump.  BS (and any changes with tx):  Diminished to clear  TREATMENTS (or changes in treatment):  2.5 mg albuterol prn  SUCTIONING (amount, color, tenacity):  Scant/small white/clear  ABG: pH: 7.399   CO2:29  PO2:208   HCO3: 17.6 BE:   O2 Sat:99  XRAY:  Patchy and hazy bibasilar opacities, prominence of the proximal pulmonary   vasculature and enlargement and indistinctness of the cardiac contour similar   to prior. No pneumothorax. For 8/18  VENTILATOR/BiPAP CHANGES:  fio2 at 60%  SBT/WEANING (settings, length of trial, toleration):  n/a at this time  PROCEDURES:  none  TRANSPORTS:  none  ETT ADVANCED/RETRACTED/SECURE:  7.5 24 at lips  SKIN INTEGRITY: intact  GOALS/PLAN: wean vent as tolerated.   ADVERSE EVENTS:    COMMENTS:

## 2016-01-29 NOTE — Progress Notes (Signed)
Nursing Critical Care End of Shift Note      SUBJECTIVE:    ? IABP removed at 0700-site remains stable without evidence of hematoma or bleed, Doppler Pedal pulses intact, renal consulted today-1000cc fluid challenge administered with 80 mg lasix given per orders, UO improved with approx 9ml/hr, monitoring electrolytes closely, able to titrate epi down, remains on milrinone and neo, pt is neurologically intact and moving all extremities, continue to monitor

## 2016-01-30 ENCOUNTER — Inpatient Hospital Stay: Admit: 2016-01-30 | Payer: MEDICAID

## 2016-01-30 LAB — ABG
A-a Gradient: 127 mmHg
Base Excess: -5.9 mmol/L — ABNORMAL LOW (ref ?–2.0)
Base Excess: -4.9 mmol/L — ABNORMAL LOW (ref ?–2.0)
Carboxyhemoglobin: 1.2 % (ref 0.0–1.5)
Carboxyhemoglobin: 0.7 % (ref 0.0–1.5)
FIO2: 40
FIO2: 40
HCO3 Arterial: 19 mmol/L — ABNORMAL LOW (ref 22.0–26.0)
HCO3 Arterial: 19.9 mmol/L — ABNORMAL LOW (ref 22.0–26.0)
Hemoglobin: 9.6 g/dL — ABNORMAL LOW (ref 12.0–16.0)
Hemoglobin: 9.9 g/dL — ABNORMAL LOW (ref 12.0–16.0)
Methemoglobin: 0.3 % (ref 0.0–1.0)
Methemoglobin: 0.3 % (ref 0.0–1.0)
Minute Ventilation: 10
Minute Ventilation: 11.25
O2Hb: 97 % (ref 94.0–97.0)
O2Hb: 96.8 % (ref 94.0–97.0)
Patient Temperature: 37 C
Patient Temperature: 37 C
Peep: 5 cmH2O
Peep: 5 cmH2O
Puncture Attempts: 1
Puncture Attempts: 1
Respiratory Rate: 20 b/min
Respiratory Rate: 20 b/min
Tidal Volume: 480 mL
Tidal Volume: 480 mL
Time Analyzed: 20170820040549
Time Analyzed: 20170820112014
a/A Ratio: 0.45
pCO2 Arterial: 35 mmHg (ref 35.0–45.0)
pCO2 Arterial: 35.3 mmHg (ref 35.0–45.0)
pH Arterial: 7.352 (ref 7.350–7.450)
pH Arterial: 7.368 (ref 7.350–7.450)
pO2 Arterial: 134.1 mmHg — ABNORMAL HIGH (ref 80.0–100.0)
pO2 Arterial: 104.4 mmHg — ABNORMAL HIGH (ref 80.0–100.0)
sO2 Arterial: 98.5 % (ref 92.0–98.5)
sO2 Arterial: 97.8 % (ref 92.0–98.5)

## 2016-01-30 LAB — CBC WITH AUTO DIFFERENTIAL
Basophils %: 1 % (ref 0–2)
Basophils %: 1 % (ref 0–2)
Basophils, Absolute: 0.1 10*3/ÂµL (ref 0.0–0.2)
Basophils, Absolute: 0.1 10*3/ÂµL (ref 0.0–0.2)
Eosinophils %: 0 % (ref 0–7)
Eosinophils %: 1 % (ref 0–7)
Eosinophils, Absolute: 0.1 10*3/ÂµL (ref 0.0–0.7)
Eosinophils, Absolute: 0.2 10*3/ÂµL (ref 0.0–0.7)
HCT: 27.9 % — ABNORMAL LOW (ref 37.0–48.0)
HCT: 28.3 % — ABNORMAL LOW (ref 37.0–48.0)
Hemoglobin: 8.6 g/dL — ABNORMAL LOW (ref 12.0–16.0)
Hemoglobin: 8.7 g/dL — ABNORMAL LOW (ref 12.0–16.0)
Lymphocytes %: 3 % — ABNORMAL LOW (ref 25–45)
Lymphocytes %: 5 % — ABNORMAL LOW (ref 25–45)
Lymphocytes, Absolute: 0.6 10*3/ÂµL — ABNORMAL LOW (ref 1.1–4.3)
Lymphocytes, Absolute: 0.9 10*3/ÂµL — ABNORMAL LOW (ref 1.1–4.3)
MCH: 22.2 pg — ABNORMAL LOW (ref 27.0–34.0)
MCH: 22.2 pg — ABNORMAL LOW (ref 27.0–34.0)
MCHC: 30.6 g/dL — ABNORMAL LOW (ref 32.0–36.0)
MCHC: 30.9 g/dL — ABNORMAL LOW (ref 32.0–36.0)
MCV: 71.9 fL — ABNORMAL LOW (ref 81.0–99.0)
MCV: 72.3 fL — ABNORMAL LOW (ref 81.0–99.0)
MPV: 8.8 fL (ref 7.4–10.4)
MPV: 8.8 fL (ref 7.4–10.4)
Monocytes %: 9 % (ref 0–12)
Monocytes %: 9 % (ref 0–12)
Monocytes, Absolute: 1.8 10*3/ÂµL — ABNORMAL HIGH (ref 0.0–1.2)
Monocytes, Absolute: 1.9 10*3/ÂµL — ABNORMAL HIGH (ref 0.0–1.2)
Neutrophils %: 85 % — ABNORMAL HIGH (ref 35–70)
Neutrophils %: 87 % — ABNORMAL HIGH (ref 35–70)
Neutrophils, Absolute: 16.2 10*3/ÂµL — ABNORMAL HIGH (ref 1.6–7.3)
Neutrophils, Absolute: 17.6 10*3/ÂµL — ABNORMAL HIGH (ref 1.6–7.3)
Platelet Count: 50 10*3/ÂµL — ABNORMAL LOW (ref 150–400)
Platelet Count: 64 10*3/ÂµL — ABNORMAL LOW (ref 150–400)
Platelet Estimate: DECREASED — AB
RBC: 3.88 10*6/ÂµL — ABNORMAL LOW (ref 4.20–5.40)
RBC: 3.91 10*6/ÂµL — ABNORMAL LOW (ref 4.20–5.40)
RDW: 22.7 % — ABNORMAL HIGH (ref 11.5–14.5)
RDW: 22.9 % — ABNORMAL HIGH (ref 11.5–14.5)
WBC: 19.4 10*3/ÂµL — ABNORMAL HIGH (ref 4.8–10.8)
WBC: 20.3 10*3/ÂµL — ABNORMAL HIGH (ref 4.8–10.8)

## 2016-01-30 LAB — BASIC METABOLIC PANEL
Anion Gap: 5.5 mmol/L (ref 3.0–11.0)
Anion Gap: 6.8 mmol/L (ref 3.0–11.0)
Anion Gap: 7 mmol/L (ref 3.0–11.0)
Anion Gap: 7.7 mmol/L (ref 3.0–11.0)
Anion Gap: 8.1 mmol/L (ref 3.0–11.0)
Anion Gap: 8.9 mmol/L (ref 3.0–11.0)
Anion Gap: 9.5 mmol/L (ref 3.0–11.0)
BUN: 30 mg/dL — ABNORMAL HIGH (ref 6–23)
BUN: 31 mg/dL — ABNORMAL HIGH (ref 6–23)
BUN: 32 mg/dL — ABNORMAL HIGH (ref 6–23)
BUN: 33 mg/dL — ABNORMAL HIGH (ref 6–23)
BUN: 34 mg/dL — ABNORMAL HIGH (ref 6–23)
BUN: 36 mg/dL — ABNORMAL HIGH (ref 6–23)
BUN: 38 mg/dL — ABNORMAL HIGH (ref 6–23)
CO2 - Carbon Dioxide: 19 mmol/L — ABNORMAL LOW (ref 21.0–31.0)
CO2 - Carbon Dioxide: 19.2 mmol/L — ABNORMAL LOW (ref 21.0–31.0)
CO2 - Carbon Dioxide: 19.3 mmol/L — ABNORMAL LOW (ref 21.0–31.0)
CO2 - Carbon Dioxide: 19.5 mmol/L — ABNORMAL LOW (ref 21.0–31.0)
CO2 - Carbon Dioxide: 19.5 mmol/L — ABNORMAL LOW (ref 21.0–31.0)
CO2 - Carbon Dioxide: 19.9 mmol/L — ABNORMAL LOW (ref 21.0–31.0)
CO2 - Carbon Dioxide: 20.1 mmol/L — ABNORMAL LOW (ref 21.0–31.0)
Calcium: 10 mg/dL (ref 8.6–10.3)
Calcium: 9.5 mg/dL (ref 8.6–10.3)
Calcium: 9.5 mg/dL (ref 8.6–10.3)
Calcium: 9.5 mg/dL (ref 8.6–10.3)
Calcium: 9.6 mg/dL (ref 8.6–10.3)
Calcium: 9.7 mg/dL (ref 8.6–10.3)
Calcium: 9.8 mg/dL (ref 8.6–10.3)
Chloride: 104 mmol/L (ref 98–111)
Chloride: 104 mmol/L (ref 98–111)
Chloride: 105 mmol/L (ref 98–111)
Chloride: 105 mmol/L (ref 98–111)
Chloride: 106 mmol/L (ref 98–111)
Chloride: 106 mmol/L (ref 98–111)
Chloride: 106 mmol/L (ref 98–111)
Creatinine: 3.09 mg/dL — ABNORMAL HIGH (ref 0.55–1.10)
Creatinine: 3.11 mg/dL — ABNORMAL HIGH (ref 0.55–1.10)
Creatinine: 3.16 mg/dL — ABNORMAL HIGH (ref 0.55–1.10)
Creatinine: 3.43 mg/dL — ABNORMAL HIGH (ref 0.55–1.10)
Creatinine: 3.55 mg/dL — ABNORMAL HIGH (ref 0.55–1.10)
Creatinine: 3.79 mg/dL — ABNORMAL HIGH (ref 0.55–1.10)
Creatinine: 3.91 mg/dL — ABNORMAL HIGH (ref 0.55–1.10)
GFR Estimate: 12 mL/min/{1.73_m2} — ABNORMAL LOW (ref >=60)
GFR Estimate: 12 mL/min/{1.73_m2} — ABNORMAL LOW (ref >=60)
GFR Estimate: 13 mL/min/{1.73_m2} — ABNORMAL LOW (ref >=60)
GFR Estimate: 14 mL/min/{1.73_m2} — ABNORMAL LOW (ref >=60)
GFR Estimate: 15 mL/min/{1.73_m2} — ABNORMAL LOW (ref >=60)
GFR Estimate: 15 mL/min/{1.73_m2} — ABNORMAL LOW (ref >=60)
GFR Estimate: 15 mL/min/{1.73_m2} — ABNORMAL LOW (ref >=60)
Glucose: 102 mg/dL — ABNORMAL HIGH (ref 80–99)
Glucose: 104 mg/dL — ABNORMAL HIGH (ref 80–99)
Glucose: 105 mg/dL — ABNORMAL HIGH (ref 80–99)
Glucose: 114 mg/dL — ABNORMAL HIGH (ref 80–99)
Glucose: 126 mg/dL — ABNORMAL HIGH (ref 80–99)
Glucose: 92 mg/dL (ref 80–99)
Glucose: 96 mg/dL (ref 80–99)
Potassium: 4 mmol/L (ref 3.5–5.1)
Potassium: 4 mmol/L (ref 3.5–5.1)
Potassium: 4.1 mmol/L (ref 3.5–5.1)
Potassium: 4.2 mmol/L (ref 3.5–5.1)
Potassium: 4.5 mmol/L (ref 3.5–5.1)
Potassium: 5 mmol/L (ref 3.5–5.1)
Potassium: 5.3 mmol/L — ABNORMAL HIGH (ref 3.5–5.1)
Sodium: 131 mmol/L — ABNORMAL LOW (ref 135–143)
Sodium: 131 mmol/L — ABNORMAL LOW (ref 135–143)
Sodium: 132 mmol/L — ABNORMAL LOW (ref 135–143)
Sodium: 133 mmol/L — ABNORMAL LOW (ref 135–143)
Sodium: 133 mmol/L — ABNORMAL LOW (ref 135–143)
Sodium: 133 mmol/L — ABNORMAL LOW (ref 135–143)
Sodium: 133 mmol/L — ABNORMAL LOW (ref 135–143)

## 2016-01-30 LAB — PNEUMOCYSTIS (CARINII) JIROVECII IFA PANEL
Pneumocystis (carinii) jirovecii IFA: NEGATIVE
Pneumocystis (carinii) jirovecii IFA: NEGATIVE

## 2016-01-30 LAB — HEPATITIS B CORE TOTAL ANTIBODIES: Hepatitis B Core, Total Antibodies: NEGATIVE

## 2016-01-30 LAB — BODY FLUID CELL COUNT WITH DIFFERENTIAL
RBC Count, Body Fluid: 1901 /mm3
RBC Count, Body Fluid: 604 /mm3
TNC Count (inc WBC), Body fluid: 149 /mm3
TNC Count (inc WBC), Body fluid: 208 /mm3

## 2016-01-30 LAB — CHLAMYDIA PNEUMONIAE DNA, PCR
C. Pneumoniae by PCR: NOT DETECTED
C. Pneumoniae by PCR: NOT DETECTED

## 2016-01-30 LAB — RESPIRATORY CULTURE
Culture Result: NO GROWTH
Culture Result: NO GROWTH
Gram Stain: NONE SEEN
Gram Stain: NONE SEEN

## 2016-01-30 LAB — LEGIONELLA SPECIES BY PCR
Legionella Result: NEGATIVE
Legionella Result: NEGATIVE

## 2016-01-30 LAB — POCT GLUCOSE
POC Glucose: 116 mg/dL — ABNORMAL HIGH (ref 80–99)
POC Glucose: 106 mg/dL — ABNORMAL HIGH (ref 80–99)
POC Glucose: 100 mg/dL — ABNORMAL HIGH (ref 80–99)
POC Glucose: 99 mg/dL (ref 80–99)
POC Glucose: 104 mg/dL — ABNORMAL HIGH (ref 80–99)

## 2016-01-30 LAB — DIFFERENTIAL PANEL, FLUID
Lymphocytes % Body Fluid: 3 %
Lymphocytes % Body Fluid: 3 %
Monocytes/Macrophages % Body Fluid: 20 %
Monocytes/Macrophages % Body Fluid: 38 %
Seg neutrophils, fluid: 59 %
Seg neutrophils, fluid: 77 %
Total Cells Counted in Differential, Body Fluid: 100
Total Cells Counted in Differential, Body Fluid: 100

## 2016-01-30 LAB — RENAL FUNCTION PANEL
Albumin: 2.5 g/dL — ABNORMAL LOW (ref 3.5–5.0)
Albumin: 2.5 g/dL — ABNORMAL LOW (ref 3.5–5.0)
Anion Gap: 11.6 mmol/L — ABNORMAL HIGH (ref 3.0–11.0)
Anion Gap: 6.8 mmol/L (ref 3.0–11.0)
BUN: 38 mg/dL — ABNORMAL HIGH (ref 6–23)
BUN: 38 mg/dL — ABNORMAL HIGH (ref 6–23)
CO2 - Carbon Dioxide: 19.2 mmol/L — ABNORMAL LOW (ref 21.0–31.0)
CO2 - Carbon Dioxide: 19.4 mmol/L — ABNORMAL LOW (ref 21.0–31.0)
Calcium: 9.5 mg/dL (ref 8.6–10.3)
Calcium: 9.6 mg/dL (ref 8.6–10.3)
Chloride: 102 mmol/L (ref 98–111)
Chloride: 105 mmol/L (ref 98–111)
Creatinine: 3.78 mg/dL — ABNORMAL HIGH (ref 0.55–1.10)
Creatinine: 3.79 mg/dL — ABNORMAL HIGH (ref 0.55–1.10)
GFR Estimate: 12 mL/min/{1.73_m2} — ABNORMAL LOW (ref >=60)
GFR Estimate: 12 mL/min/{1.73_m2} — ABNORMAL LOW (ref >=60)
Glucose: 92 mg/dL (ref 80–99)
Glucose: 92 mg/dL (ref 80–99)
Phosphorus: 4 mg/dL (ref 2.5–4.5)
Phosphorus: 4 mg/dL (ref 2.5–4.5)
Potassium: 4.2 mmol/L (ref 3.5–5.1)
Potassium: 4.3 mmol/L (ref 3.5–5.1)
Sodium: 131 mmol/L — ABNORMAL LOW (ref 135–143)
Sodium: 133 mmol/L — ABNORMAL LOW (ref 135–143)

## 2016-01-30 LAB — FUNGUS CULTURE (OTHER)
Direct Exam: NONE SEEN
Direct Exam: NONE SEEN
Fungal Culture: NO GROWTH
Fungal Culture: NO GROWTH

## 2016-01-30 LAB — MTB COMPLEX DNA, RAP PCR
M Tuberculosis Complex PCR: NEGATIVE
M Tuberculosis Complex PCR: NEGATIVE

## 2016-01-30 LAB — HERPES SIMPLEX VIRUS (HSV), MOLECULAR DETECTION, PCR, VARIES (MAYO)
HSV 1, PCR: NEGATIVE
HSV 1, PCR: NEGATIVE
HSV 2, PCR: NEGATIVE
HSV 2, PCR: NEGATIVE

## 2016-01-30 LAB — MYCOPLASMA (MYCOPLASMOIDES) PNEUMONIAE WITH MACROLIDE RESISTANCE REFLEX, MOLECULAR DETECTION, PCR, VARIES (MAYO)
M. pneumoniae PCR: NEGATIVE
M. pneumoniae PCR: NEGATIVE

## 2016-01-30 LAB — APTT: APTT: 38 Seconds — ABNORMAL HIGH (ref 23–36)

## 2016-01-30 LAB — IONIZED CALCIUM: Ionized Calcium: 1.39 mmol/L — ABNORMAL HIGH (ref 1.12–1.30)

## 2016-01-30 LAB — MAGNESIUM
Magnesium: 2 mg/dL (ref 1.6–2.4)
Magnesium: 2 mg/dL (ref 1.6–2.4)

## 2016-01-30 LAB — VARICELLA-ZOSTER VIRUS, MOLECULAR DETECTION, PCR, VARIES (MAYO)
Varicella-Zoster Virus PCR: NEGATIVE
Varicella-Zoster Virus PCR: NEGATIVE

## 2016-01-30 LAB — CYTOMEGALOVIRUS (CMV) MOLECULAR DETECTION, PCR, VARIES (MAYO)
Cytomegalovirus PCR: NEGATIVE
Cytomegalovirus PCR: NEGATIVE

## 2016-01-30 LAB — AFB CULTURE AND SMEAR
AFB Smear: NONE SEEN
AFB Smear: NONE SEEN

## 2016-01-30 LAB — HEPATITIS B SURFACE ANTIGEN: Hepatitis B Surface Antigen Interpretation: NEGATIVE

## 2016-01-30 LAB — HEPATITIS B CORE ANTIBODY, IGM: Hepatitis B Core IgM Antibody Interpretation: NEGATIVE

## 2016-01-30 LAB — HEPATITIS B SURFACE ANTIBODY: Hepatitis B Surface Antibody Interpretation: NON-IMMUNE/NOT IMMUNE — AB

## 2016-01-30 MED ORDER — PRISMASATE (contains 2 mEq KCl/L and 0 mEq Ca/L prior to the addition of electrolytes.) with calcium chloride 2.5 mEq/L, potassium chloride 2 mEq/L
ARTERIOVENOUS_FISTULA | Status: DC
Start: 2016-01-30 — End: 2016-02-04
  Administered 2016-01-31 – 2016-02-04 (×29): via INTRAVENOUS_CENTRAL

## 2016-01-30 MED ORDER — albuterol (PROVENTIL) nebulizer solution 2.5 mg
2.5 | RESPIRATORY_TRACT | Status: DC
Start: 2016-01-30 — End: 2016-02-08
  Administered 2016-01-31 – 2016-02-08 (×49): 2.5 mg via RESPIRATORY_TRACT

## 2016-01-30 MED ORDER — potassium chloride 20 mEq in sodium chloride 0.9 % (NS) 250 mL infusion
2 | INTRAVENOUS | Status: DC | PRN
Start: 2016-01-30 — End: 2016-02-04

## 2016-01-30 MED ORDER — heparin in NS infusion 2 unit/mL
1000 | INTRAVENOUS | Status: DC | PRN
Start: 2016-01-30 — End: 2016-01-30

## 2016-01-30 MED ORDER — magnesium sulfate 1 g in D5W IVPB Premix
1 | INTRAVENOUS | Status: DC | PRN
Start: 2016-01-30 — End: 2016-02-04
  Administered 2016-02-01: 07:00:00 1100 g via INTRAVENOUS
  Administered 2016-02-01: 05:00:00 1 g via INTRAVENOUS
  Administered 2016-02-02: 19:00:00 1100 g via INTRAVENOUS
  Administered 2016-02-03: 01:00:00 1 g via INTRAVENOUS
  Administered 2016-02-03: 02:00:00 1100 g via INTRAVENOUS

## 2016-01-30 MED ORDER — PRISMASATE (contains 2 mEq KCl/L and 0 mEq Ca/L prior to the addition of electrolytes.) with calcium chloride 2.5 mEq/L, potassium chloride 2 mEq/L
ARTERIOVENOUS_FISTULA | Status: DC
Start: 2016-01-30 — End: 2016-02-04
  Administered 2016-01-31 – 2016-02-04 (×13): via INTRAVENOUS_CENTRAL

## 2016-01-30 MED ORDER — heparin (porcine) injection 5,000 Units
1000 | Freq: Once | INTRAMUSCULAR | Status: AC
Start: 2016-01-30 — End: 2016-01-30
  Administered 2016-01-31: 02:00:00 via INTRAVENOUS

## 2016-01-30 MED ORDER — PRISMASATE (contains 2 mEq KCl/L and 0 mEq Ca/L prior to the addition of electrolytes.) with calcium chloride 2.5 mEq/L, potassium chloride 2 mEq/L
ARTERIOVENOUS_FISTULA | Status: DC
Start: 2016-01-30 — End: 2016-02-04
  Administered 2016-01-31 – 2016-02-04 (×13): via INTRAVENOUS_CENTRAL

## 2016-01-30 MED ORDER — chlorothiazide (DIURIL) injection 500 mg
500 | Freq: Once | INTRAVENOUS | Status: AC
Start: 2016-01-30 — End: 2016-01-30
  Administered 2016-01-30: 15:00:00 500 mg via INTRAVENOUS

## 2016-01-30 MED ORDER — heparin (porcine) injection 1,000-3,000 Units
1000 | INTRAMUSCULAR | Status: DC | PRN
Start: 2016-01-30 — End: 2016-02-01

## 2016-01-30 MED ORDER — heparin (porcine) injection 3,000 Units
1000 | Freq: Once | INTRAMUSCULAR | Status: AC
Start: 2016-01-30 — End: 2016-01-30
  Administered 2016-01-30: 23:00:00 via INTRAVENOUS

## 2016-01-30 MED ORDER — sodium chloride 0.9 % (NS) syringe 10 mL
INTRAMUSCULAR | Status: DC | PRN
Start: 2016-01-30 — End: 2016-02-04

## 2016-01-30 MED ORDER — norepinephrine (LEVOPHED) 4 mg in D5W 250mL (16 mcg/mL) premix infusion
4 | INTRAVENOUS | Status: DC | PRN
Start: 2016-01-30 — End: 2016-02-13
  Administered 2016-01-30: 16:00:00 4 ug/min via INTRAVENOUS
  Administered 2016-01-30 (×4): 425016 ug/min via INTRAVENOUS
  Administered 2016-01-31 (×2): 4 ug/min via INTRAVENOUS
  Administered 2016-01-31 (×2): 425016 ug/min via INTRAVENOUS
  Administered 2016-01-31: 12:00:00 4 ug/min via INTRAVENOUS
  Administered 2016-01-31 (×2): 425016 ug/min via INTRAVENOUS
  Administered 2016-01-31: 19:00:00 4 ug/min via INTRAVENOUS
  Administered 2016-01-31 – 2016-02-01 (×8): 425016 ug/min via INTRAVENOUS
  Administered 2016-02-01: 04:00:00 4 ug/min via INTRAVENOUS
  Administered 2016-02-01 (×3): 425016 ug/min via INTRAVENOUS
  Administered 2016-02-01: 18:00:00 4 ug/min via INTRAVENOUS
  Administered 2016-02-01: 22:00:00 425016 ug/min via INTRAVENOUS
  Administered 2016-02-01: 10:00:00 4 ug/min via INTRAVENOUS
  Administered 2016-02-01 – 2016-02-02 (×12): 425016 ug/min via INTRAVENOUS
  Administered 2016-02-02: 02:00:00 4 ug/min via INTRAVENOUS
  Administered 2016-02-02 (×5): 425016 ug/min via INTRAVENOUS
  Administered 2016-02-02: 10:00:00 4 ug/min via INTRAVENOUS
  Administered 2016-02-03: 18:00:00 425016 ug/min via INTRAVENOUS
  Administered 2016-02-03: 18:00:00 4 ug/min via INTRAVENOUS
  Administered 2016-02-03 – 2016-02-04 (×26): 425016 ug/min via INTRAVENOUS
  Administered 2016-02-04: 16:00:00 4 ug/min via INTRAVENOUS
  Administered 2016-02-05 (×5): 425016 ug/min via INTRAVENOUS
  Administered 2016-02-05: 05:00:00 4 ug/min via INTRAVENOUS
  Administered 2016-02-05: 18:00:00 425016 ug/min via INTRAVENOUS
  Administered 2016-02-05: 13:00:00 4 ug/min via INTRAVENOUS
  Administered 2016-02-05: 19:00:00 425016 ug/min via INTRAVENOUS

## 2016-01-30 MED ORDER — calcium chloride 1 g in dextrose 5 % (D5W) 100 mL IVPB
100 | INTRAVENOUS | Status: DC | PRN
Start: 2016-01-30 — End: 2016-02-04

## 2016-01-30 MED ORDER — furosemide (LASIX) injection 80 mg
10 | Freq: Once | INTRAMUSCULAR | Status: AC
Start: 2016-01-30 — End: 2016-01-30
  Administered 2016-01-30: 14:00:00 10 mg via INTRAVENOUS

## 2016-01-30 MED ORDER — sodium phosphate 15 mmol in sodium chloride 0.9 % (NS) 100 mL IVPB
3 | INTRAVENOUS | Status: DC | PRN
Start: 2016-01-30 — End: 2016-02-04
  Administered 2016-02-01 – 2016-02-02 (×4): via INTRAVENOUS

## 2016-01-30 MED ORDER — heparin (porcine) injection
1000 | INTRAMUSCULAR | Status: DC
Start: 2016-01-30 — End: 2016-02-01
  Administered 2016-01-31: 23:00:00 via INTRAVENOUS
  Administered 2016-01-31: 19:00:00 1000 [IU]/kg/h via INTRAVENOUS
  Administered 2016-01-31 – 2016-02-01 (×2): via INTRAVENOUS
  Administered 2016-02-01 (×2): 1000 [IU]/kg/h via INTRAVENOUS

## 2016-01-30 MED FILL — PHENYLEPHRINE 10 MG/ML INJECTION SOLUTION: 10 mg/mL | INTRAMUSCULAR | Qty: 4

## 2016-01-30 MED FILL — FENTANYL (PF) IN 0.9 % NACL 1250 MCG/250 ML PREMIX INFUSION: 1250 mcg/250 mL (5 mcg/mL) | INTRAVENOUS | Qty: 250

## 2016-01-30 MED FILL — NOREPINEPHRINE BITARTRATE 4 MG/250 ML (16 MCG/ML) IN DEXTROSE 5 % IV: 4 mg/250 mL (16 mcg/mL) | INTRAVENOUS | Qty: 250

## 2016-01-30 MED FILL — SODIUM BICARBONATE 8.4 % (1 MEQ/ML) INTRAVENOUS SOLUTION: 8.4 % (1 mEq/mL) | INTRAVENOUS | Qty: 150

## 2016-01-30 MED FILL — PRISMASOL BGK K (2 MEQ/L)-MG(1MEQ/L) HEMODIALYSIS SOLUTION: ARTERIOVENOUS_FISTULA | Qty: 5

## 2016-01-30 MED FILL — CHLOROTHIAZIDE SODIUM 500 MG INTRAVENOUS SOLUTION: 500 mg | INTRAVENOUS | Qty: 500

## 2016-01-30 MED FILL — HEPARIN (PORCINE) 1,000 UNIT/ML INJECTION VIAL: 1000 [IU]/mL | INTRAMUSCULAR | Qty: 1

## 2016-01-30 MED FILL — ALBUTEROL SULFATE 2.5 MG/3 ML (0.083 %) SOLUTION FOR NEBULIZATION: 2.5 mg/3 mL (0.083 %) | RESPIRATORY_TRACT | Qty: 3

## 2016-01-30 MED FILL — ENOXAPARIN 40 MG/0.4 ML SUBCUTANEOUS SYRINGE: 40 | SUBCUTANEOUS | Qty: 1

## 2016-01-30 MED FILL — FUROSEMIDE 10 MG/ML INJECTION SOLUTION: 10 mg/mL | INTRAMUSCULAR | Qty: 8

## 2016-01-30 MED FILL — MILRINONE 20 MG/100 ML(200 MCG/ML) IN 5 % DEXTROSE INTRAVENOUS PIGGYBK: 200 ug/mL | INTRAVENOUS | Qty: 100

## 2016-01-30 MED FILL — MIDAZOLAM 1 MG/ML IN 0.9 % NACL IVPB PREMIX: 1 mg/mL | INTRAVENOUS | Qty: 50

## 2016-01-30 MED FILL — ASPIRIN 300 MG RECTAL SUPPOSITORY: 300 mg | RECTAL | Qty: 1

## 2016-01-30 MED FILL — FAMOTIDINE (PF) IN 0.9% NACL 20 MG/50 ML IVPB PREMIX: 20 mg/50 mL | INTRAVENOUS | Qty: 50

## 2016-01-30 MED FILL — HEPARIN (PORCINE) (PF) 1,000 UNIT/500 ML IN 0.9 % SODIUM CHLORIDE IV: 1000 unit/500 mL | INTRAVENOUS | Qty: 500

## 2016-01-30 NOTE — Progress Notes (Signed)
Respiratory End of Shift Summary   01/30/2016                    5:07 AM         Sensorium    Intubated and sedated    Breath Sounds   (changes during shift) Clear, diminished. No treatment needed.     Response to therapy   (or changes in therapy) Alb MN Prn   Is cough productive or require suctioning (frequency, characteristics)       No frequent suctioning needed. Small amount of yellow, thick from ETT.    Ventilator  (start & end of shift settings   and other relevant assessments)             Settings remain: PRVC 480/20/40% Peep 5.  No changes made overnight.     BiPAP   (settings, changes, tolerance, usage, continuous, sleep only) N/A    Weaning attempts/plans                 N/A    Skin Integrity No breakdown noted     Significant Events  (transport, procedures) N/A   Other Comments ETT secure at 24cm @ Lips  Morning ABG: 7.35/35/134/19     Norris Cross, RT

## 2016-01-30 NOTE — Progress Notes (Signed)
CARDIOTHORACIC SURGERY CRITICAL CARE NOTE     Name: Brandy [name]  DOB: 1956-03-19 60 y.o.  MRN: [id number]  CSN: [id number]    Brandy [name] is a 60 y.o. female patient. Patient was admitted with Respiratory failure (CMS/HCC)  Who had Procedure(s):  AORTIC VALVE REPLACEMENT; INTRA AORTIC BALLOON INSERTION.  3 Days Post-Op    HPI: The patient is a 60 y.o. female with the below medical co-morbid conditions.    ?  1. Severe symptomatic aortic stenosis                        Ejection fraction 30%     Found on TEE intra-op to be 15%                        Normal coronaries                        Predominant symptom dyspnea                     2. Severe Pulmonary Hypertension  3. Mild to Moderate Mitral regurgitation  4. Morbid obesity                        Weight loss 50 pounds in the last one year  5. respiratory insufficiency  6. Non-ST elevation MI                        The demand ischemia  7. Iron deficiency anemia                        CT chest abdomen and pelvis reviewed no abdominal masses                        Likely AVM secondary to aortic stenosis not a good candidate for sedation and EGD or colonoscopy well aortic stenosis is so critical  ?  Patient's a 60 year old female who presented [facility] [place] in respiratory distress unable to breathe.  There are her troponin was elevated she underwent oxygen therapy and was given Lasix.  Cardiac workup showed normal coronary arteries with depressed LV function on echo and severe aortic stenosis valve area less than 0.4 velocity 4.4 m/s with an EF 30%.  She was transfused 2 units of PRBCs A [facility] hospital with a hemoglobin 8.8.  His immobility volume overload from heart failure  ?  Patient hasn't been to the doctor in years she hasn't taken any medications nor does she take good care of herself.  [place] part of [place] she is unable to consistently take meds.  She likes to use and asked to [employer] wood to heat her house.    ?  After a few  days of Lasix patient states that she was breathing much better she is 60% better per her suggestion.  I had a long talk with the family about the disease course why she was getting short of breath and discussion of heart failure.  They understand that she is given a need aortic valve replacement she wishes to have tissue valve I do think she is a better surgical candidate and have her given her age and risk.  ?  She has very poor dentition    POD#1  Patient has been  stable on balloon 1:2   Methylene blue last night SVR much better   Coronary perfusion good   Today I am managing acute heart failure with management of the IABP.    Postop day #2  removal of the intra-aortic balloon pump renal ultrasound completed showing minimal flow to the kidneys  patient still making urine      Subjective: pt doing well     Assessment & Plan:     Active Hospital Problems    Diagnosis SNOMED CT(R) Date Noted   . S/P AVR (aortic valve replacement) HISTORY OF AORTIC VALVE REPLACEMENT 01/27/2016   . Respiratory failure (CMS/HCC) RESPIRATORY FAILURE 01/23/2016   . Aortic stenosis, severe AORTIC VALVE STENOSIS 01/22/2016   . NSTEMI (non-ST elevated myocardial infarction) (CMS/HCC) ACUTE NON-ST SEGMENT ELEVATION MYOCARDIAL INFARCTION 01/21/2016   . Demand ischemia of myocardium (CMS/HCC) ACUTE ISCHEMIC HEART DISEASE 01/21/2016   . Iron deficiency anemia IRON DEFICIENCY ANEMIA 01/21/2016   . Obesity OBESITY 01/21/2016     Neurologic:  Sedated    On sedation vacation followed commands  Cardiac: Pre-op EF:   EF 15% prior to surgery   Post op 30% with good valve function 23mm valve     1. Inotropes EPI/Milrinone Continue for heart function support   2. Pressors: Switch to levophed today          3. Rhythm: Normal sinus rhythum with intact AVN   4. Aspirin: 300 rectal   5. Beta-Blocker: hold   6. Statin: hold   7. ACE/ARB: hold        Respiratory:    Smoker none   Pulmonary toilet    Xray: Very congested    Gastrointestinal:    Genitourinary:  creat: pre op 0.9   Now 1.16 >2.6Up to 3.4    Infectious Disease:  WBC: 17>18> 19.4    Hematology: HGB 9.8> 8.6   PLTS 180> 91>64 concerned about Hitt  Endocrine: Glucose management   Electrolytes: replacing Mg/K/Ca  Prophylaxis: Lovenox twice a day PPI/H2 Blocker     Scheduled Medications:   . aspirin  300 mg Rectal Daily   . chlorhexidine  15 mL Mouth/Throat Q12H   . enoxaparin (LOVENOX) subcutaneous injection  40 mg Subcutaneous 2 times per day   . famotidine  20 mg Intravenous Daily   . levofloxacin (LEVAQUIN) IV  750 mg Intravenous Q48H   . phenylephrine       . phenylephrine       . sodium chloride 0.9 % (NS) syringe  10 mL Intravenous Q8H SCH     Infusions:   . clevidipine     . dexmedetomidine Stopped (01/29/16 1123)   . dextrose 10% (D10W)     . dextrose 5% lactated ringers Stopped (01/29/16 1829)   . DOBUTamine (DOBUTREX) 250 mg in D5W 250 mL (1000 mcg/mL) infusion     . fentanyl 100 mcg/hr (01/30/16 0655)   . heparin (porcine)     . insulin (HUMAN R) 150 Units in NaCl 0.9 % 150 mL (1 unit/mL) infusion - cardiac surgery Stopped (01/29/16 1841)   . midazolam 1.2 mg/hr (01/30/16 0302)   . milrinone 0.4 mcg/kg/min (01/30/16 0606)   . nitroglycerin     . norepinephrine     . phenylephrine (NEO-SYNEPHRINE) 40 mg in NaCl 0.9 % 500 mL (80 mcg/mL) infusion **DOUBLE CONCENTRATION** 90 mcg/min (01/30/16 0803)   . vasopressin (PITRESSIN) 50 units in NaCl 0.9 % 250 mL (0.2 units/mL) infusion **ADULT SHOCK** Stopped (01/28/16 1711)  Recent Labs:  Lab Results   Component Value Date    NA 133 (L) 01/30/2016    K 4.0 01/30/2016    K 3.69 01/23/2016    CL 105 01/30/2016    CL 104.1 01/23/2016    CO2 19.9 (L) 01/30/2016    CO2 20 (L) 01/23/2016    GLU 104 (H) 01/30/2016    GLU 85 01/23/2016    BUN 33 (H) 01/30/2016    CREATININES 3.43 (H) 01/30/2016    CALCIUM 9.6 01/30/2016    AST 28 01/26/2016    ALT 26 01/26/2016    ALKPHOS 72 01/26/2016    BILITOT 0.90 01/21/2016    ALBUMIN 3.3 (L) 01/26/2016    GLOB 3.3 01/26/2016       AGRATIO 1.0 01/26/2016    ANIONGAP 8.1 01/30/2016    ANIONGAP 21.0 (H) 01/23/2016    LABGLOM 14 (L) 01/30/2016    LABGLOM >60.0 01/23/2016    WHITEBLOODCE 19.4 (H) 01/30/2016    LABPLAT 64 (L) 01/30/2016    HGB 8.7 (L) 01/30/2016    PROTIME 18.7 (H) 01/26/2016    INR 1.7 (H) 01/26/2016    APTT 28 01/26/2016          Vital Signs:      ? Cardiac output CCO: 5.1 L/Min    ? Cardiac Index CCI: 2.3 L/min  Temp: 37.2 ?C (99 ?F) BP: 105/65 Pulse: 103 Resp: 20 SpO2: 99 % on   Ventilator   Intake/Output:     CT OUTPUT: 500     URINE OUTPUT: 1655    Exam:  Neuro:no TIA or stroke-like symptoms  Cardiac:regular rate and rhythm, S1, S2 normal, no murmur, click, rub or gallop  Lungs;clear  ABDOMEN:abdomen is soft without significant tenderness, masses, organomegaly or guarding  EXTREMITIES:less then 2 second capillary refill    PLAN:   Neurologically patient does move all extremities  Cardiovascular PA catheter clotted off yesterday it was removed secondary to risk of further thrombosis  Vascular checks to lower extremities  Continue on a ventilator PA pressures better  Would like a map of at least 75  Dr. Stefano Gaul seeing the patient in may start CRRT      Critical Care Time: 40 minutes outside any procedures and family discussion.  Patient remains critical and at high risk for organ failure and death.      Otho Bellows

## 2016-01-30 NOTE — Progress Notes (Signed)
Bronchoscopy note by Respiratory    Start Time:  1215  End TIme:  1315    Medications = amount used    Saline:60 cc   2% Lidocaine:  0   20% Mucomyst:  0  Albuterol:  0  4% Lidocaine:  0    Tolerance:  well    Adverse Reactions:  none    Comments:  Samples taken to lab

## 2016-01-30 NOTE — Progress Notes (Signed)
Respiratory End of Shift Summary   01/30/2016                    4:43 PM         Sensorium    sedated    Breath Sounds   (changes during shift) coarse    Response to therapy   (or changes in therapy) Decrease coarseness with increased aeration post treatment   Is cough productive or require suctioning (frequency, characteristics)       small to moderate amounts of clear sputum with red streaks    Ventilator  (start & end of shift settings   and other relevant assessments)             FIO2 weaned to 40%    BiPAP   (settings, changes, tolerance, usage, continuous, sleep only) na    Weaning attempts/plans                 Not indicated    Skin Integrity intact     Significant Events  (transport, procedures) Bedside bronchoscopy with samples sent to lab   Other Comments      Cecilio Ohlrich, RRT

## 2016-01-30 NOTE — Progress Notes (Signed)
Nutrition Follow-Up    Brandy [name] is a 60 y.o. female, DOB 1956/05/01    Subjective:  Pt remains intubated and sedated. Enteral nutrition has not been initiated. Per nephrology note today,  No firm indications for initiation of CRRT this morning, but patient is at high risk to develop indication for renal replacement therapy.    Anthropometric Measurements:   Ht: 167.6 cm (66)        Admit Wt: 101.4 kg (224 lbs)  IBW: 59.09 kg (130 lbs)   %IBW: 172%  BMI: 36.1 (no edema)   Ht/wt status:obese class 2              Nutrition-Focused Physical Findings:  Patient appears well-nourished and well-hydrated   Poor dentition per physician note   Nutrition risk factors: Current morbidity   Risk for nutritional deficit:high d/t current intubation       Food and Nutritional Intake:  Diet Orders:   Procedures   . Diet, NPO     PO Intake is inadequate     Today is now day #4 of NPO status without nutrition support.     Other Allergies: Sulfa (sulfonamide antibiotics)     Client History:  Admission Diagnosis: Respiratory failure (CMS/HCC) [J96.90]    Diagnosis:     SNOMED CT(R)   1. Aortic stenosis, severe  AORTIC VALVE STENOSIS   2. Acute systolic heart failure (CMS/HCC)  ACUTE SYSTOLIC HEART FAILURE   3. Demand ischemia of myocardium (CMS/HCC)  ACUTE ISCHEMIC HEART DISEASE   4. Non-ischemic cardiomyopathy (CMS/HCC)  CARDIOMYOPATHY     Past Medical History:   Diagnosis Date   . History of blood transfusion      Past Surgical History:   Procedure Laterality Date   . PROCEDURE N/A 01/27/2016    Procedure: AORTIC VALVE REPLACEMENT; INTRA AORTIC BALLOON INSERTION;  Surgeon: Otho Bellows, MD;  Location: [facility] OR;  Service: Cardiology;  Laterality: N/A;   . TONSILLECTOMY     . TUBAL LIGATION     . TUBAL LIGATION  1980       Usual meds include: Reviewed   Current meds include: Pepcid, lasix, insulin, zofran, potassium chloride    Potassium Protocol: Not noted   Bowel Protocol: Not noted   Propofol: None currently   Cleviprex:  None currently   Pressors: Epinephrine, phenylephrine  Prokinetics: None   Parayltics: None   Levothyroxine: None  Coumadin: None       Biochemical Data, Medical Tests and Procedures:  Lab Results   Component Value Date    NA 133 (L) 01/30/2016    K 4.0 01/30/2016    CL 105 01/30/2016    CO2 19.9 (L) 01/30/2016    GLU 104 (H) 01/30/2016    BUN 33 (H) 01/30/2016    CREATININES 3.43 (H) 01/30/2016    LABCREA 77 01/28/2016    MG 2.4 01/29/2016   Creatinine and BUN are trending up.    Ref. Range 01/26/2016 20:02   % GLYCOHEMOGLOBIN (A1C) Latest Ref Range: 4.3 - 6.1 % 6.0     CBG: 91-146mg /dL    Nutrition Diagnosis (Problem, Etiology, Signs and Symptoms)  Inadequate protein-energy intake related to inability to consume nutrients via oral route as evidenced by mechanical ventilation and lack of nutrition support.   (Progress:  Not progressing related to continued lack of nutrition support.)    Nutrition Intervention:   Admit Wt: 101.4 kg (224 lbs)  IBW: 59.09 kg (130 lbs)    Estimated needs:  11-14  kcals/kg actual wt or 1115 - 1420 kcals  2g protein/kg IBW or 118g protein (1.2g protein/kg actual wt)    If unable to extubated pt and if TF is clinically appropriate, recommendation would be for: Peptamen Intense VHP @ goal rate of 63mL/hr providing 1320 kcals, 121g protein, free water. Additional free water per physician.     Monitoring and Evaluation:   Will follow and monitor TF initiation/tolerance, labs, and status per nutrition protocol.      Follow Up: 1-3 days      Teola Bradley, RD  01/30/2016  7:04 AM   Inpatient Nutrition

## 2016-01-30 NOTE — Progress Notes (Signed)
RESPIRATORY ASSESSMENT    SUBJECTIVE:   Patient intubated and sedated     OBJECTIVE:   Current Vital Signs:    Pre and post C/S: faint coarseness pre treatment with increased aeration and decreased coarseness post treatment   HR:  123   RR:  27              Cough:Suctioned for small amount of clear sputum with red streaks   SpO2:   100%   ABG Results: Ph:7.368   , CO2:35.3     ,PO2:  104.4    , HCO3: 19.9  Vent/Bipap Settings: PRVC, 20, 480, 5, 40%   CXR: IMPRESSION:    ?  1. Placement of a LEFT transjugular central catheter with the tip just above   the superior cavoatrial junction.  2. Improved appearance of the chest. There is decreasing patchy bilateral   pulmonary opacities and decreasing pulmonary vascular congestion.    ASSESSMENT:    Bronchodilator therapy is beneficial     PLAN:   2.5 mg Albuterol Q 4     GOAL(S):  Medication:   Mobilize secretions  Minimize or eliminate adventitious breath sounds  Maximize airflow        CMV/NIV:  Maintain a patent airway  Normalize and maintain adequate ABG values for patient clinical situation  Prevent or correct atelectasis   Minimize risk of VAP              Supplemental Oxygen:   Maintain SpO2 > 92%  Normalize and maintain an adequate SpO2/PaO2 appropriate for patient clinical situation    Per PDP # 102 and MD orders    E.S.  Margretta Ditty, RRT

## 2016-01-30 NOTE — Progress Notes (Signed)
Blood pressure labile toward beginning of shift. Titrated epinephrine off - using levophed and phenylephrine for BP management.     Heart rate increased from ~100 to ~118 toward the end of shift. Intensivist notified.    Nephrologist was updated about the patient's arterial blood gas and basic metabolic panel. Nephrology wants to initiate CRRT today. Intensivist and family updated.

## 2016-01-30 NOTE — Progress Notes (Signed)
Critical Care Progress Note:    Subjective: This is a 60 y.o.?year-old female?with critical symptomatic aortic stenosis?who underwent AVR?on 8/17. We were consulted for critical care management while she is in the CCU. Her intraoperative LVEF was only 15%. She required the IABP post operatively for cardiogenic shock. Hence extubation has been delayed. She continues to require levophed, milrinone and neosynephrine infusions. Her cardiac indices look good on these meds and the IABP was removed on 8/18.  She is well sedated with propofol and fentanyl.  Her urine output is dwindling and her Cr is increasing. Hyperkalemia the morning of 8/19 was treated medically.   CRRT is being contemplated. She continues to be febrile and CXR shows bilateral infiltrates. Swan clotted and was removed.    Medications: Reviewed    Objective:    Physical Exam:  Vitals:    01/30/16 1030   BP:    Pulse: 105   Resp: (!) 26   Temp:    SpO2: 99%     Gen: 59 y.o.?yr old well-nourished, well-developed female?lying in bed intubated and sedated.  Eyes: PERRL, Lids and Conjunctivae: no discharge or pallor and non-injected. Corneas: grossly intact. Sclerae: non-icteric.   ENMT: orally intubated.  Neck: Supple, No LAD, No JVD, No thyromegaly. RIJ Swan ganz removed.  Respiratory: clear to auscultation. No use of accessory muscles. No wheezes, rhonchi or crackles.  Chest: Symmetric, equal rise with inspiration, no masses or tenderness. Sternotomy wound clean. 2 Chest tubes.  Cardiac: sinus tach, bilateral peripheral edema, peripheral pulses palpable bilaterally.  Abdm: Soft, no guarding/rigidity/tenderness/rebound tenderness/hepatosplenomegaly  Muskuloskeletal: no edema, no cyanosis, no clubbing. Gait not examined.  Skin: Warm, no obvious lesions or ulcers.   Neuro: sedated    Labs: reviewed    Imaging: reviewed personally and independently    Assessment:    - s/p AVR for severe symptomatic aortic stenosis on 8/17  - Preop LVEF 15%  - post op  cardiogenic shock requiring multiple inotropes and pressors along with IABP (removed on 8/19)  - acute renal failure  - hypoxic respiratory failure with possible pneumonia  - possible component of septic shock.    Plan:    Neurologic: Analgesia and sedation as required.   Pulmonary: Continue mechanical ventilation until hemodynamics stabilize. No SBTs until then. Inhaled Flolan for high pulmonary pressures, better. Will bronch.  Cardiac: Currently on milrinone, levophed and neosynephrine. IABP removed yesterday. Continue aspirin. Defer to primary team.  ID: perioperative cefazolin completed. Now febrile with no obvious source of infection. Will bronch given bilateral infiltrates. Empiric Levaquin.  Renal: acute renal failure. Urine output dwindling. Renal duplex showed low flow to kidneys. Nephrology consultation requested for possible renal replacement therapy.  GI: NPO. H2b.  Endocrine: maintain euglycemia.  Heme: monitor H&H and transfuse as necessary. Monitor progressive thrombocytopenia?  Prophylaxis: H2b, Lovenox, SCDs, HOB elevation  ??  Code Status: full  ?  This patient remains at high risk of organ failure, deterioration, and/or death and continues to require critical care management and hence is in a critical care unit, requiring intensive monitoring.   ?  Critical Care Time, excluding billable procedures, greater than: 30 mins    Brandy [name]

## 2016-01-30 NOTE — Procedures (Signed)
Critical Care Procedure Note:    Procedure: Triple lumen dialysis cath insertion   Date of Procedure: 01/30/2016   Indication: Hemodialysis/ renal failure  Consent: The family members were counseled regarding the procedure, its indications, risks, potential complications and alternatives, and any questions were answered. Consent was obtained to proceed.    Procedure Description:   A Time out was performed prior to starting the procedure. The patient was place in trendelenburg position.The skin overlying the left internal jugular vein was cleansed with chlorhexidine. Full barrier precautions were used during the insertion of the line. Bedside ultrasound was used during insertion of the line. Lidocaine was used for local anesthesia. A needle was advanced into the vein under ultrasound guidance with return of venous blood. A wire was advanced through the needle into the vein and the needle was withdrawn. A incision was made in the skin with #11 scalpel, the tract was dilated, and using Seldinger technique the triple lumen dialysis catheter was advanced over the guidewire into the vein.  The guidewire was removed and there was flow of blood through all 3 lumens. The lumens were flushed and capped.  The catheter was sutured in position and covered with a sterile dressing. There were no immediate complications and a chest Xray was ordered to confirm placement. From my interpretation, the tip of the catheter is in the SVC, there was no pneumothorax. Final read is pending.  Blood loss was less than 2ml.    Consuella Lose, NP  Acute Care Nurse Practitioner

## 2016-01-30 NOTE — Progress Notes (Signed)
Pt has been tachycardic in the past without obvious cause, this is again the case with HR 130's.  Atrial epicardial wires connected to pacer to visualize artrial rate to evaluate for possible 2:1 flutter.  Atrial rate confirms pt is in ST 1:1 A:V rate of 130's.  WCM.

## 2016-01-30 NOTE — Progress Notes (Signed)
cxr checked ET tube 3.0 cm above the carina at the level of T 4

## 2016-01-30 NOTE — Progress Notes (Signed)
PHARMACIST CRITICAL CARE PROGRESS NOTE     Consult Per P&T Protocol: Critical Care Physician Service, Antimicrobial Interventions, Renal Dosing, IV to PO         Summary: Brandy [name] is [name] admitted on 01/23/2016 with a NSTEMI s/p R/L heart craterization.     Subjective: I visited the patient, but was unable to explain the pharmacist plan of care to the patient due to sedation and/or intubation.    Allergies: Sulfa (sulfonamide antibiotics)    PMH:  has a past medical history of History of blood transfusion.     Surgical history:  has a past surgical history that includes Tonsillectomy; Tubal ligation; Tubal ligation (1980); and ocedure (N/A, 01/27/2016).    Active problems:   Patient Active Problem List    Diagnosis SNOMED CT(R) Date Noted   . S/P AVR (aortic valve replacement) HISTORY OF AORTIC VALVE REPLACEMENT 01/27/2016   . Non-ischemic cardiomyopathy (CMS/HCC) CARDIOMYOPATHY 01/23/2016   . Respiratory failure (CMS/HCC) RESPIRATORY FAILURE 01/23/2016   . Aortic stenosis, severe AORTIC VALVE STENOSIS 01/22/2016   . Acute systolic heart failure (CMS/HCC) ACUTE SYSTOLIC HEART FAILURE 01/22/2016   . Heart failure (CMS/HCC) HEART FAILURE 01/21/2016   . Elevated brain natriuretic peptide (BNP) level HORMONE LEVEL - FINDING 01/21/2016   . NSTEMI (non-ST elevated myocardial infarction) (CMS/HCC) ACUTE NON-ST SEGMENT ELEVATION MYOCARDIAL INFARCTION 01/21/2016   . Demand ischemia of myocardium (CMS/HCC) ACUTE ISCHEMIC HEART DISEASE 01/21/2016   . Tachycardia TACHYCARDIA 01/21/2016   . Pleural effusion PLEURAL EFFUSION 01/21/2016   . Obesity OBESITY 01/21/2016   . Acute respiratory failure with hypoxia (CMS/HCC) ACUTE RESPIRATORY FAILURE 01/21/2016   . Hypotension LOW BLOOD PRESSURE 01/21/2016   . Iron deficiency anemia IRON DEFICIENCY ANEMIA 01/21/2016   . Thrombocytosis (CMS/HCC) THROMBOCYTOSIS 01/21/2016   . Weight loss WEIGHT DECREASED 01/21/2016   . Family history of cancer FAMILY HISTORY OF CANCER  01/21/2016   . Cardiomegaly CARDIOMEGALY 01/21/2016   . Mediastinal lymphadenopathy MEDIASTINAL LYMPHADENOPATHY 01/21/2016   . Hilar lymphadenopathy- bilataeral  HILAR LYMPHADENOPATHY 01/21/2016   . Chest pain CHEST PAIN 01/21/2016   . Dyspnea and respiratory abnormality DYSPNEA 01/21/2016   . Abnormal EKG ELECTROCARDIOGRAM ABNORMAL 01/21/2016   . Opacity of lung on imaging study ABNORMAL FINDINGS ON DIAGNOSTIC IMAGING OF LUNG 01/21/2016   . Liver lesion- small hypodense lesion around falciform lig LESION OF LIVER 01/21/2016       OBJECTIVE:     Diet Orders:   Procedures   . Diet, NPO       Height: 167.6 cm (66) Admit weight: 101.4 kg (223 lb 8.7 oz) Last charted weight: 114.4 kg (252 lb 3.3 oz) IBW: 59.3 kg Body mass index is 40.71 kg/(m^2).    I/O last 3 completed shifts:  In: 5769 [I.V.:5139; NG/GT:60; IV Piggyback:570]  Out: 2860 [Urine:1980; Emesis/NG output:30; Chest Tube:850]    Recent Labs      01/30/16   0401  01/30/16   0814  01/30/16   1156   NA  133*  133*  133*   K  4.0  4.0  4.1   CL  105  104  104   CO2  19.9*  20.1*  19.5*   GLU  104*  102*  96   BUN  33*  34*  36*   CREATININES  3.43*  3.55*  3.91*   CALCIUM  9.6  9.5  9.5     Recent Labs      01/27/16   2153  01/28/16   1600  01/29/16   0345   CAION  1.32*  1.39*  1.40*     Recent Labs      01/28/16   1600  01/28/16   2158  01/29/16   0345   MG  2.1  1.4*  2.4     Recent Labs      01/29/16   1549  01/29/16   1743  01/29/16   1840  01/29/16   2006  01/29/16   2214  01/30/16   0410   POCGLU  112*  116*  106*  100*  99  104*     Recent Labs      01/28/16   0344  01/28/16   1600  01/29/16   0345  01/30/16   0401   WHITEBLOODCE  17.1*  16.8*  18.1*  19.4*   NEUTROABS  14.2*   --   15.6*  16.2*   HGB  9.8*  8.9*  8.6*  8.7*   HCT  31.8*  28.4*  28.1*  28.3*   LABPLAT  180  113*  91*  64*       Temp (24hrs), Avg:37.9 ?C (100.3 ?F), Min:36.3 ?C (97.3 ?F), Max:38.6 ?C (101.5 ?F)      Relevant Microbiology:  Date: Source: Results:  Status:   8/19 Blood x 4  NGTD Pending   8/19 ET Asp  Sent     ASSESSMENT:     Estimated CrCl: 19.6 mL/min (CG AdjBW) per [employer] [facility]    BUN/SCr ratio: 9.2    Critical Care Prophylaxis:  VAP: Peridex 0.12% 15 mL BID   VTE: Lovenox 40 mg SubQ BID (CTS dosing)   SUP: Pepcid 20 mg IV daily     CBG Monitoring:   ? CBG goal in critically ill: 140-180  ? CBG in past 24H: 107-174    Propofol/Triglyceride Monitoring:  ? Not on propofol    Antimicrobial Monitoring:   Indication for antimicrobials: Sepsis of unknown origin (per Intensivist 8/19)   Current antimicrobials: Levaquin 750 mg IV Q48H (8/19-current)   Relevant previous antimicrobials:    # of days of therapy directed at source of infection or pathogen: Day 2   Comments about duration? Dependent on source  General sepsis: 7-10 days (Surviving Sepsis)   Notes about antimicrobial therapy or microbiology data?        PLAN:     Critical Care Prophylaxis:  ? Prophylactic medications ordered   ? Post op thrombocytopenia, Lovenox dosed by CTS  ? If PLT < 60, consider re-evaluation of Lovenox dose     Antimicrobial Interventions:   ? Levaquin started empirically for sepsis  ? Will follow    Renal Dosing:   ? Per Nephrology note 8/20: No firm indications for initiation of CRRT this morning, but patient is at high risk to develop indication for renal replacement therapy.  ? Will change Levaquin to 500 mg IV Q48H if CrCl remains 10-19 mL/min tomorrow      IV to PO:   ? No switches at the moment       Signed by: Carylon Perches, PharmD, BCPS, BCCCP

## 2016-01-30 NOTE — Progress Notes (Signed)
Daily Progress Note     Name: Brandy [name]  DOB: Oct 29, 1955 60 y.o.  MRN: [id number]  CSN: [id number]    Assessment & Plan:     Active Hospital Problems    Diagnosis SNOMED CT(R) Date Noted   . S/P AVR (aortic valve replacement) HISTORY OF AORTIC VALVE REPLACEMENT 01/27/2016   . Respiratory failure (CMS/HCC) RESPIRATORY FAILURE 01/23/2016   . Aortic stenosis, severe AORTIC VALVE STENOSIS 01/22/2016   . NSTEMI (non-ST elevated myocardial infarction) (CMS/HCC) ACUTE NON-ST SEGMENT ELEVATION MYOCARDIAL INFARCTION 01/21/2016   . Demand ischemia of myocardium (CMS/HCC) ACUTE ISCHEMIC HEART DISEASE 01/21/2016   . Iron deficiency anemia IRON DEFICIENCY ANEMIA 01/21/2016   . Obesity OBESITY 01/21/2016     --AKI: ATN secondary to cardiogenic shock and possible sepsis from nosocomial pneumonia.  Nonoliguric, but unable to diurese with IV lasix monotherapy. No firm indications for initiation of CRRT this morning, but patient is at high risk to develop indication for renal replacement therapy.  CXR concerning for pulmonary edema, but infiltrates may be infectious.     --Hyperkalemia: Improved  --Metabolic Acidosis: Improved with NaHCO3 gtts.  --Pneumonia: On Levaquin, still febrile.  --S/P AVR:  Complicated by cardiogenic shock.  Hemodynamics improving.  Weaning pressor support.    Subjective:     Brandy [name] is a 60 y.o. female patient. Patient was admitted with critical AS s/p AVR complicated by cardiogenic shock.  Remains intubated on FiO2 at 40%.  Continued fever.  Levaquin added.  Weaning epi down.  I/O:  +2.1 L. UO 1.7 Liters.      Scheduled Medications:   . aspirin  300 mg Rectal Daily   . chlorhexidine  15 mL Mouth/Throat Q12H   . chlorothiazide  500 mg Intravenous Once   . enoxaparin (LOVENOX) subcutaneous injection  40 mg Subcutaneous 2 times per day   . famotidine  20 mg Intravenous Daily   . levofloxacin (LEVAQUIN) IV  750 mg Intravenous Q48H   . phenylephrine       . phenylephrine       . sodium  chloride 0.9 % (NS) syringe  10 mL Intravenous Q8H SCH     Infusions:   . clevidipine     . dexmedetomidine Stopped (01/29/16 1123)   . dextrose 10% (D10W)     . dextrose 5% lactated ringers Stopped (01/29/16 1829)   . DOBUTamine (DOBUTREX) 250 mg in D5W 250 mL (1000 mcg/mL) infusion     . fentanyl 100 mcg/hr (01/30/16 0655)   . heparin (porcine)     . insulin (HUMAN R) 150 Units in NaCl 0.9 % 150 mL (1 unit/mL) infusion - cardiac surgery Stopped (01/29/16 1841)   . midazolam 1.2 mg/hr (01/30/16 0302)   . milrinone 0.4 mcg/kg/min (01/30/16 0606)   . nitroglycerin     . phenylephrine (NEO-SYNEPHRINE) 40 mg in NaCl 0.9 % 500 mL (80 mcg/mL) infusion **DOUBLE CONCENTRATION** 70 mcg/min (01/30/16 0300)   . vasopressin (PITRESSIN) 50 units in NaCl 0.9 % 250 mL (0.2 units/mL) infusion **ADULT SHOCK** Stopped (01/28/16 1711)     PRN Medications: acetaminophen (TYLENOL) suppository, acetaminophen, albuterol, calcium gluconate IVPB, clevidipine, dextrose 5% lactated ringers, dextrose, DOBUTamine (DOBUTREX) 250 mg in D5W 250 mL (1000 mcg/mL) infusion, EPINEPHrine, fentanyl, heparin (porcine), hydrALAZINE, HYDROcodone-acetaminophen, insulin (HUMAN R) 150 Units in NaCl 0.9 % 150 mL (1 unit/mL) infusion - cardiac surgery, magnesium sulfate IVPB, magnesium sulfate IVPB, meperidine (PF), midazolam, milrinone, morphine, nitroglycerin, oxyCODONE, phenylephrine (NEO-SYNEPHRINE) 40 mg in NaCl 0.9 % 500  mL (80 mcg/mL) infusion **DOUBLE CONCENTRATION**, protamine, sodium chloride, sodium chloride 0.9 % (NS) syringe    Objective:     Vital Signs:    Vital Sign Ranges for Last 24 Hours:  BP  Min: 99/60  Max: 145/82  Temp  Min: 37.2 ?C (99 ?F)  Max: 38.7 ?C (101.7 ?F)  Pulse  Min: 80  Max: 112  Resp  Min: 18  Max: 34  SpO2  Min: 91 %  Max: 100 %    Most Recent Vitals  BP: 105/65  Pulse: 104  Resp: 18  Temp: 37.2 ?C (99 ?F)  SpO2: 98 % (08/20 0400-08/20 0645)    Intake/Ouput:    Intake/Output Summary (Last 24 hours) at 01/30/16 0722  Last  data filed at 01/30/16 0659   Gross per 24 hour   Intake             4309 ml   Output             2155 ml   Net             2154 ml        Exam:  NAD, intubated  Coarse UA BS, CT in place  RRR  Abd: Obese, nontender  Ext: No edema  Skin: No rash.    Recent Labs:  Results for orders placed or performed during the hospital encounter of 01/23/16 (from the past 24 hour(s))   POCT Glucose -Next Routine    Collection Time: 01/29/16  8:35 AM   Result Value Ref Range    POC Glucose 129 (H) 80 - 99 mg/dL   Blood Gas-Arterial w/Lytes -Once    Collection Time: 01/29/16  8:37 AM   Result Value Ref Range    ABG PH (ASA) 7.420 7.350 - 7.450    PCO2 Arterial Blood Gas 29.8 (L) 35.0 - 45.0 mmHg    pO2 Arterial Blood Gas 181.9 (H) 80.0 - 100.0 mmHg    HCO3 Arterial Blood Gas 18.9 (L) 22.0 - 26.0 mmol/L    Base Excess -4.8 (L) -2.0 - 2.0 mmol/L    Sodium (Blood Gas) 130.0 (L) 135.0 - 143.0 mmol/L    Potassium (Blood Gas) 5.72 (H) 3.50 - 5.10 mmol/L    Ionized Calcium 1.44 (H) 1.14 - 1.30 mmol/L    tHb 9.4 (L) 12.0 - 16.0 g/dL    Z6XW 96.0 (H) 45.4 - 97.0 %    Carboxyhemoglobin 0.5 0.0 - 1.5 %    Methemoglobin 0.5 0.0 - 1.0 %    O2 Saturation 99.0 (H) 92.0 - 98.5 %    a/A Ratio 0.48     ABG AA GRADIENT (ASA) 193.3 mmHg    pH (Temp Corrected) - Arterial 7.42     pCO2 (Temp Corrected) - Arterial 30 mmHg    pO2 (Temp Corrected) - Arterial 182 mmHg    Patient Temperature 37.0 C    Allen's Test NA     Sample Type Blood     Specimen Site AL     O2 Delivery Device Ventilator     FIO2 60     Ventilator Mode PRVC     Minute Ventilation 12.7     Tidal Volume 480.0 mL    Respiratory Rate 20.0 b/min    Peep 5.0 cmH2O    Mean Airway Pressure 6     Peak Inspiratory Pressure 9.0 cmH2O    Inspiratory Time 0.6     Drawn By RN     Puncture Attempts  1     Time Analyzed [id number]     Analyzed By Thea Gist     Blood Gas Comment Total RR=28 at time of ABG    Blood Gas-Mixed Venous -PRN    Collection Time: 01/29/16  8:37 AM   Result Value Ref Range     pO2 Mixed Venous 34.9 (L) 37.0 - 43.0 mmHg    pO2 (Temp Corrected) - Mixed Venous 34.9 mmHg    tHb 9.3 g/dL    O1HY 86.5 %    Carboxyhemoglobin 0.9 %    Methemoglobin 0.8 %    sO2 66.7 (L) 70.0 - 76.0 %    Patient Temperature 37.0 C    Allen's Test NA     Sample Type Blood     Specimen Site Pulmonary Artery     O2 Delivery Device Ventilator     FIO2 60     Drawn By RN     Puncture Attempts 1     Time Analyzed [id number]     Analyzed By Thea Gist    Basic Metabolic Panel -Next Routine    Collection Time: 01/29/16  8:54 AM   Result Value Ref Range    Sodium 129 (L) 135 - 143 mmol/L    Potassium 5.7 (H) 3.5 - 5.1 mmol/L    Chloride 105 98 - 111 mmol/L    CO2 - Carbon Dioxide 18.9 (L) 21.0 - 31.0 mmol/L    Glucose 134 (H) 80 - 99 mg/dL    BUN 29 (H) 6 - 23 mg/dL    Creatinine 2.44 (H) 0.55 - 1.10 mg/dL    Calcium 01.0 8.6 - 27.2 mg/dL    Anion Gap 5.1 3.0 - 11.0 mmol/L    GFR Estimate 18 (L) >=60 mL/min/1.51m*2    GFR Additional Info     POCT Glucose -Next Routine    Collection Time: 01/29/16  9:51 AM   Result Value Ref Range    POC Glucose 137 (H) 80 - 99 mg/dL   Blood Culture -x 2    Collection Time: 01/29/16 10:33 AM   Result Value Ref Range    Blood Culture Result No Growth to Date    Blood Culture -x 2    Collection Time: 01/29/16 10:33 AM   Result Value Ref Range    Blood Culture Result No Growth to Date    POCT Glucose -Next Routine    Collection Time: 01/29/16 11:10 AM   Result Value Ref Range    POC Glucose 146 (H) 80 - 99 mg/dL   Respiratory culture -Next Routine    Collection Time: 01/29/16 11:57 AM   Result Value Ref Range    Gram Stain Acceptable    POCT Glucose -Next Routine    Collection Time: 01/29/16 12:21 PM   Result Value Ref Range    POC Glucose 141 (H) 80 - 99 mg/dL   POCT Glucose -Next Routine    Collection Time: 01/29/16  1:24 PM   Result Value Ref Range    POC Glucose 91 80 - 99 mg/dL   POCT Glucose -Next Routine    Collection Time: 01/29/16  2:33 PM   Result Value Ref Range    POC  Glucose 120 (H) 80 - 99 mg/dL   Basic Metabolic Panel -Timed    Collection Time: 01/29/16  2:34 PM   Result Value Ref Range    Sodium 130 (L) 135 - 143 mmol/L    Potassium 5.5 (H) 3.5 - 5.1 mmol/L  Chloride 106 98 - 111 mmol/L    CO2 - Carbon Dioxide 18.2 (L) 21.0 - 31.0 mmol/L    Glucose 131 (H) 80 - 99 mg/dL    BUN 30 (H) 6 - 23 mg/dL    Creatinine 1.61 (H) 0.55 - 1.10 mg/dL    Calcium 9.6 8.6 - 09.6 mg/dL    Anion Gap 5.8 3.0 - 11.0 mmol/L    GFR Estimate 16 (L) >=60 mL/min/1.12m*2    GFR Additional Info     POCT Glucose -Next Routine    Collection Time: 01/29/16  3:49 PM   Result Value Ref Range    POC Glucose 112 (H) 80 - 99 mg/dL   Basic Metabolic Panel -Q4H    Collection Time: 01/29/16  5:39 PM   Result Value Ref Range    Sodium 131 (L) 135 - 143 mmol/L    Potassium 5.3 (H) 3.5 - 5.1 mmol/L    Chloride 106 98 - 111 mmol/L    CO2 - Carbon Dioxide 19.5 (L) 21.0 - 31.0 mmol/L    Glucose 126 (H) 80 - 99 mg/dL    BUN 31 (H) 6 - 23 mg/dL    Creatinine 0.45 (H) 0.55 - 1.10 mg/dL    Calcium 9.7 8.6 - 40.9 mg/dL    Anion Gap 5.5 3.0 - 11.0 mmol/L    GFR Estimate 15 (L) >=60 mL/min/1.1m*2    GFR Additional Info     POCT Glucose -Next Routine    Collection Time: 01/29/16  5:43 PM   Result Value Ref Range    POC Glucose 116 (H) 80 - 99 mg/dL   POCT Glucose -Next Routine    Collection Time: 01/29/16  6:40 PM   Result Value Ref Range    POC Glucose 106 (H) 80 - 99 mg/dL   Basic Metabolic Panel -Q4H    Collection Time: 01/29/16  8:01 PM   Result Value Ref Range    Sodium 132 (L) 135 - 143 mmol/L    Potassium 5.0 3.5 - 5.1 mmol/L    Chloride 106 98 - 111 mmol/L    CO2 - Carbon Dioxide 19.0 (L) 21.0 - 31.0 mmol/L    Glucose 105 (H) 80 - 99 mg/dL    BUN 30 (H) 6 - 23 mg/dL    Creatinine 8.11 (H) 0.55 - 1.10 mg/dL    Calcium 91.4 8.6 - 78.2 mg/dL    Anion Gap 7.0 3.0 - 11.0 mmol/L    GFR Estimate 15 (L) >=60 mL/min/1.78m*2    GFR Additional Info     POCT Glucose -Next Routine    Collection Time: 01/29/16  8:06 PM   Result  Value Ref Range    POC Glucose 100 (H) 80 - 99 mg/dL   POCT Glucose -Next Routine    Collection Time: 01/29/16 10:14 PM   Result Value Ref Range    POC Glucose 99 80 - 99 mg/dL   Basic Metabolic Panel -Q4H    Collection Time: 01/29/16 11:53 PM   Result Value Ref Range    Sodium 133 (L) 135 - 143 mmol/L    Potassium 4.5 3.5 - 5.1 mmol/L    Chloride 106 98 - 111 mmol/L    CO2 - Carbon Dioxide 19.3 (L) 21.0 - 31.0 mmol/L    Glucose 114 (H) 80 - 99 mg/dL    BUN 32 (H) 6 - 23 mg/dL    Creatinine 9.56 (H) 0.55 - 1.10 mg/dL    Calcium 9.8 8.6 - 10.3  mg/dL    Anion Gap 7.7 3.0 - 11.0 mmol/L    GFR Estimate 15 (L) >=60 mL/min/1.17m*2    GFR Additional Info     Blood Gas-Arterial -Daily    Collection Time: 01/30/16  4:01 AM   Result Value Ref Range    ABG PH (ASA) 7.352 7.350 - 7.450    PCO2 Arterial Blood Gas 35.0 35.0 - 45.0 mmHg    pO2 Arterial Blood Gas 134.1 (H) 80.0 - 100.0 mmHg    HCO3 Arterial Blood Gas 19.0 (L) 22.0 - 26.0 mmol/L    Base Excess -5.9 (L) -2.0 - 2.0 mmol/L    tHb 9.6 (L) 12.0 - 16.0 g/dL    Z6XW 96.0 45.4 - 09.8 %    Carboxyhemoglobin 1.2 0.0 - 1.5 %    Methemoglobin 0.3 0.0 - 1.0 %    O2 Saturation 98.5 92.0 - 98.5 %    Patient Temperature 37.0 C    Allen's Test NA     Sample Type Blood     Specimen Site AL     O2 Delivery Device Ventilator     FIO2 40     Ventilator Mode PRVC     Minute Ventilation 10     Tidal Volume 480.0 mL    Respiratory Rate 20.0 b/min    Peep 5.0 cmH2O    Drawn By RN     Puncture Attempts 1     Time Analyzed [id number]     Analyzed By Norris Cross    Basic Metabolic Panel -Daily    Collection Time: 01/30/16  4:01 AM   Result Value Ref Range    Sodium 133 (L) 135 - 143 mmol/L    Potassium 4.0 3.5 - 5.1 mmol/L    Chloride 105 98 - 111 mmol/L    CO2 - Carbon Dioxide 19.9 (L) 21.0 - 31.0 mmol/L    Glucose 104 (H) 80 - 99 mg/dL    BUN 33 (H) 6 - 23 mg/dL    Creatinine 0.86 (H) 0.55 - 1.10 mg/dL    Calcium 9.6 8.6 - 57.8 mg/dL    Anion Gap 8.1 3.0 - 11.0 mmol/L    GFR Estimate  14 (L) >=60 mL/min/1.3m*2    GFR Additional Info     CBC with Auto Differential -Daily    Collection Time: 01/30/16  4:01 AM   Result Value Ref Range    WBC 19.4 (H) 4.8 - 10.8 10*3/?L    RBC 3.91 (L) 4.20 - 5.40 10*6/?L    Hemoglobin 8.7 (L) 12.0 - 16.0 g/dL    HCT 46.9 (L) 62.9 - 48.0 %    MCV 72.3 (L) 81.0 - 99.0 fL    MCH 22.2 (L) 27.0 - 34.0 pg    MCHC 30.6 (L) 32.0 - 36.0 g/dL    RDW 52.8 (H) 41.3 - 14.5 %    Platelet Count 64 (L) 150 - 400 10*3/?L    MPV 8.8 7.4 - 10.4 fL    Neutrophils % 85 (H) 35 - 70 %    Lymphocytes % 5 (L) 25 - 45 %    Monocytes % 9 0 - 12 %    Eosinophils % 1 0 - 7 %    Basophils % 1 0 - 2 %    Neutrophils, Absolute 16.2 (H) 1.6 - 7.3 10*3/?L    Lymphocytes, Absolute 0.9 (L) 1.1 - 4.3 10*3/?L    Monocytes, Absolute 1.8 (H) 0.0 - 1.2 10*3/?L    Eosinophils, Absolute  0.2 0.0 - 0.7 10*3/?L    Basophils, Absolute 0.1 0.0 - 0.2 10*3/?L    Differential Type Automated Differential     Platelet Estimate Decreased (A) Normal    Anisocytosis 3+ (A) (none)    Hypochromasia 1+ (A) (none)    Microcytes 2+ (A) (none)    Polychromasia 1+ (A) (none)   POCT Glucose -Next Routine    Collection Time: 01/30/16  4:10 AM   Result Value Ref Range    POC Glucose 104 (H) 80 - 99 mg/dL     Pending Labs     Order Current Status    Blood Gas-Arterial -See Comments for Frequency In process    Extra Tubes (ASA) -Next Routine In process    Red Top -Once In process    Blood Culture -x 2 Preliminary result    Blood Culture -x 2 Preliminary result    Respiratory culture -Next Routine Preliminary result        Recent Imaging:  No results found.     LOS: 7 days     Elby Showers  01/30/2016  7:22 AM

## 2016-01-30 NOTE — Procedures (Signed)
Procedures performed:    1) Flexible bronchoscopy  2) Broncho-alveolar lavage  3) Therapeutic aspiration of the tracheobronchial tree  4) Bronchial wash    Indication for procedure: Pneumonia     Sedation: Versed and fentanyl iv    Procedure in detail: The olympus flexible bronchoscope was introduced through the ET tube. An airway exam showed quite inflamed airways up to the 3rd generation bronchi. Secretions were copious, thick and blood tinged and these were therapeutically suctioned. The major airways were therapeutically suctioned. A BAL was performed in the medial subsegment of the right middle lobe with 60 ml instilled and 30 mls of cloudy return. Bronchial washings were then performed. The bronchoscope was then withdrawn and the procedure terminated. The patient tolerated the procedure well and there were no immediate complications. The BAL and bronchial washings were sent for microbiological and cytological analysis.

## 2016-01-31 ENCOUNTER — Inpatient Hospital Stay: Payer: MEDICAID

## 2016-01-31 ENCOUNTER — Inpatient Hospital Stay: Admit: 2016-01-31 | Payer: MEDICAID

## 2016-01-31 LAB — ABG
Base Excess: -7.4 mmol/L — ABNORMAL LOW (ref ?–2.0)
Base Excess: -4.4 mmol/L — ABNORMAL LOW (ref ?–2.0)
Carboxyhemoglobin: 0.7 % (ref 0.0–1.5)
Carboxyhemoglobin: 0.8 % (ref 0.0–1.5)
FIO2: 40
FIO2: 40
HCO3 Arterial: 18.1 mmol/L — ABNORMAL LOW (ref 22.0–26.0)
HCO3 Arterial: 20.8 mmol/L — ABNORMAL LOW (ref 22.0–26.0)
Hemoglobin: 9.9 g/dL — ABNORMAL LOW (ref 12.0–16.0)
Hemoglobin: 8.8 g/dL — ABNORMAL LOW (ref 12.0–16.0)
Methemoglobin: 0.3 % (ref 0.0–1.0)
Methemoglobin: 0.3 % (ref 0.0–1.0)
Minute Ventilation: 12
Minute Ventilation: 12
O2Hb: 96 % (ref 94.0–97.0)
O2Hb: 97.3 % — ABNORMAL HIGH (ref 94.0–97.0)
Patient Temperature: 37 C
Patient Temperature: 37 C
Peep: 5 cmH2O
Peep: 5 cmH2O
Puncture Attempts: 1
Puncture Attempts: 1
Respiratory Rate: 24 b/min
Tidal Volume: 480 mL
Tidal Volume: 480 mL
Time Analyzed: 20170820213027
Time Analyzed: 20170821043029
pCO2 Arterial: 36.1 mmHg (ref 35.0–45.0)
pCO2 Arterial: 38.1 mmHg (ref 35.0–45.0)
pH Arterial: 7.317 — ABNORMAL LOW (ref 7.350–7.450)
pH Arterial: 7.354 (ref 7.350–7.450)
pO2 Arterial: 98.8 mmHg (ref 80.0–100.0)
pO2 Arterial: 123.3 mmHg — ABNORMAL HIGH (ref 80.0–100.0)
sO2 Arterial: 97 % (ref 92.0–98.5)
sO2 Arterial: 98.4 % (ref 92.0–98.5)

## 2016-01-31 LAB — RENAL FUNCTION PANEL
Albumin: 2.4 g/dL — ABNORMAL LOW (ref 3.5–5.0)
Albumin: 2.4 g/dL — ABNORMAL LOW (ref 3.5–5.0)
Albumin: 2.4 g/dL — ABNORMAL LOW (ref 3.5–5.0)
Albumin: 2.4 g/dL — ABNORMAL LOW (ref 3.5–5.0)
Anion Gap: 8.7 mmol/L (ref 3.0–11.0)
Anion Gap: 8.1 mmol/L (ref 3.0–11.0)
Anion Gap: 8 mmol/L (ref 3.0–11.0)
Anion Gap: 8.1 mmol/L (ref 3.0–11.0)
BUN: 35 mg/dL — ABNORMAL HIGH (ref 6–23)
BUN: 31 mg/dL — ABNORMAL HIGH (ref 6–23)
BUN: 27 mg/dL — ABNORMAL HIGH (ref 6–23)
BUN: 23 mg/dL (ref 6–23)
CO2 - Carbon Dioxide: 21.3 mmol/L (ref 21.0–31.0)
CO2 - Carbon Dioxide: 21.9 mmol/L (ref 21.0–31.0)
CO2 - Carbon Dioxide: 23 mmol/L (ref 21.0–31.0)
CO2 - Carbon Dioxide: 23.9 mmol/L (ref 21.0–31.0)
Calcium: 9 mg/dL (ref 8.6–10.3)
Calcium: 9 mg/dL (ref 8.6–10.3)
Calcium: 9 mg/dL (ref 8.6–10.3)
Calcium: 8.9 mg/dL (ref 8.6–10.3)
Chloride: 105 mmol/L (ref 98–111)
Chloride: 103 mmol/L (ref 98–111)
Chloride: 102 mmol/L (ref 98–111)
Chloride: 102 mmol/L (ref 98–111)
Creatinine: 3.59 mg/dL — ABNORMAL HIGH (ref 0.55–1.10)
Creatinine: 3.26 mg/dL — ABNORMAL HIGH (ref 0.55–1.10)
Creatinine: 2.78 mg/dL — ABNORMAL HIGH (ref 0.55–1.10)
Creatinine: 2.48 mg/dL — ABNORMAL HIGH (ref 0.55–1.10)
GFR Estimate: 13 mL/min/{1.73_m2} — ABNORMAL LOW (ref >=60)
GFR Estimate: 14 mL/min/{1.73_m2} — ABNORMAL LOW (ref >=60)
GFR Estimate: 17 mL/min/{1.73_m2} — ABNORMAL LOW (ref >=60)
GFR Estimate: 20 mL/min/{1.73_m2} — ABNORMAL LOW (ref >=60)
Glucose: 93 mg/dL (ref 80–99)
Glucose: 97 mg/dL (ref 80–99)
Glucose: 106 mg/dL — ABNORMAL HIGH (ref 80–99)
Glucose: 106 mg/dL — ABNORMAL HIGH (ref 80–99)
Phosphorus: 3.5 mg/dL (ref 2.5–4.5)
Phosphorus: 3.2 mg/dL (ref 2.5–4.5)
Phosphorus: 2.9 mg/dL (ref 2.5–4.5)
Phosphorus: 2.4 mg/dL — ABNORMAL LOW (ref 2.5–4.5)
Potassium: 4.2 mmol/L (ref 3.5–5.1)
Potassium: 4.2 mmol/L (ref 3.5–5.1)
Potassium: 4.1 mmol/L (ref 3.5–5.1)
Potassium: 4.1 mmol/L (ref 3.5–5.1)
Sodium: 135 mmol/L (ref 135–143)
Sodium: 133 mmol/L — ABNORMAL LOW (ref 135–143)
Sodium: 133 mmol/L — ABNORMAL LOW (ref 135–143)
Sodium: 134 mmol/L — ABNORMAL LOW (ref 135–143)

## 2016-01-31 LAB — CBC WITH AUTO DIFFERENTIAL
Basophils %: 0 % (ref 0–2)
Basophils, Absolute: 0 10*3/ÂµL (ref 0.0–0.2)
Eosinophils %: 0 % (ref 0–7)
Eosinophils, Absolute: 0 10*3/ÂµL (ref 0.0–0.7)
HCT: 25.1 % — ABNORMAL LOW (ref 37.0–48.0)
Hemoglobin: 7.7 g/dL — ABNORMAL LOW (ref 12.0–16.0)
Lymphocytes %: 8 % — ABNORMAL LOW (ref 25–45)
Lymphocytes, Absolute: 1.3 10*3/ÂµL (ref 1.1–4.3)
MCH: 22.1 pg — ABNORMAL LOW (ref 27.0–34.0)
MCHC: 30.7 g/dL — ABNORMAL LOW (ref 32.0–36.0)
MCV: 71.9 fL — ABNORMAL LOW (ref 81.0–99.0)
MPV: 8.4 fL (ref 7.4–10.4)
Monocytes %: 4 % (ref 0–12)
Monocytes, Absolute: 0.7 10*3/ÂµL (ref 0.0–1.2)
Neutrophils %: 88 % — ABNORMAL HIGH (ref 35–70)
Neutrophils, Absolute: 14.8 10*3/ÂµL — ABNORMAL HIGH (ref 1.6–7.3)
Platelet Count: 32 10*3/ÂµL — ABNORMAL LOW (ref 150–400)
Platelet Estimate: DECREASED — AB
RBC: 3.49 10*6/ÂµL — ABNORMAL LOW (ref 4.20–5.40)
RDW: 23.8 % — ABNORMAL HIGH (ref 11.5–14.5)
WBC: 16.8 10*3/ÂµL — ABNORMAL HIGH (ref 4.8–10.8)

## 2016-01-31 LAB — COMPREHENSIVE METABOLIC PANEL
ALT - Alanine Aminotransferase: 4 IU/L — ABNORMAL LOW (ref 7–52)
AST - Aspartate Aminotransferase: 39 IU/L (ref 8–39)
Albumin/Globulin Ratio: 1.1 (ref >0.9)
Albumin: 2.4 g/dL — ABNORMAL LOW (ref 3.5–5.0)
Alkaline Phosphatase: 91 IU/L (ref 34–104)
Anion Gap: 8.1 mmol/L (ref 3.0–11.0)
BUN: 31 mg/dL — ABNORMAL HIGH (ref 6–23)
Bilirubin Total: 2.3 mg/dL — ABNORMAL HIGH (ref 0.3–1.2)
CO2 - Carbon Dioxide: 21.9 mmol/L (ref 21.0–31.0)
Calcium: 9 mg/dL (ref 8.6–10.3)
Chloride: 103 mmol/L (ref 98–111)
Creatinine: 3.26 mg/dL — ABNORMAL HIGH (ref 0.55–1.10)
GFR Estimate: 14 mL/min/{1.73_m2} — ABNORMAL LOW (ref >=60)
Globulin: 2.2 g/dL (ref 2.2–3.7)
Glucose: 97 mg/dL (ref 80–99)
Potassium: 4.2 mmol/L (ref 3.5–5.1)
Protein Total: 4.6 g/dL — ABNORMAL LOW (ref 6.0–8.0)
Sodium: 133 mmol/L — ABNORMAL LOW (ref 135–143)

## 2016-01-31 LAB — BASIC METABOLIC PANEL
Anion Gap: 8.9 mmol/L (ref 3.0–11.0)
Anion Gap: 8.1 mmol/L (ref 3.0–11.0)
BUN: 37 mg/dL — ABNORMAL HIGH (ref 6–23)
BUN: 31 mg/dL — ABNORMAL HIGH (ref 6–23)
CO2 - Carbon Dioxide: 19.1 mmol/L — ABNORMAL LOW (ref 21.0–31.0)
CO2 - Carbon Dioxide: 21.9 mmol/L (ref 21.0–31.0)
Calcium: 9.4 mg/dL (ref 8.6–10.3)
Calcium: 9 mg/dL (ref 8.6–10.3)
Chloride: 103 mmol/L (ref 98–111)
Chloride: 103 mmol/L (ref 98–111)
Creatinine: 4 mg/dL — ABNORMAL HIGH (ref 0.55–1.10)
Creatinine: 3.26 mg/dL — ABNORMAL HIGH (ref 0.55–1.10)
GFR Estimate: 11 mL/min/{1.73_m2} — ABNORMAL LOW (ref >=60)
GFR Estimate: 14 mL/min/{1.73_m2} — ABNORMAL LOW (ref >=60)
Glucose: 96 mg/dL (ref 80–99)
Glucose: 97 mg/dL (ref 80–99)
Potassium: 4.2 mmol/L (ref 3.5–5.1)
Potassium: 4.2 mmol/L (ref 3.5–5.1)
Sodium: 131 mmol/L — ABNORMAL LOW (ref 135–143)
Sodium: 133 mmol/L — ABNORMAL LOW (ref 135–143)

## 2016-01-31 LAB — APTT
APTT: 48 Seconds — ABNORMAL HIGH (ref 23–36)
APTT: 49 Seconds — ABNORMAL HIGH (ref 23–36)
APTT: 50 Seconds — ABNORMAL HIGH (ref 23–36)

## 2016-01-31 LAB — MAGNESIUM
Magnesium: 1.8 mg/dL (ref 1.6–2.4)
Magnesium: 1.8 mg/dL (ref 1.6–2.4)
Magnesium: 1.7 mg/dL (ref 1.6–2.4)

## 2016-01-31 LAB — IONIZED CALCIUM
Ionized Calcium: 1.29 mmol/L (ref 1.12–1.30)
Ionized Calcium: 1.31 mmol/L — ABNORMAL HIGH (ref 1.12–1.30)
Ionized Calcium: 1.31 mmol/L — ABNORMAL HIGH (ref 1.12–1.30)
Ionized Calcium: 1.34 mmol/L — ABNORMAL HIGH (ref 1.12–1.30)

## 2016-01-31 LAB — POCT GLUCOSE
POC Glucose: 101 mg/dL — ABNORMAL HIGH (ref 80–99)
POC Glucose: 90 mg/dL (ref 80–99)

## 2016-01-31 LAB — PHOSPHORUS: Phosphorus: 3.2 mg/dL (ref 2.5–4.5)

## 2016-01-31 LAB — LEGIONELLA ANTIGEN, URINE: Legionella by EIA: NEGATIVE

## 2016-01-31 LAB — STREPTOCOCCUS PNEUMONIAE ANTIGEN,  URINE: Streptococcus pneumoniae Antigen, Urine: NEGATIVE

## 2016-01-31 MED ORDER — levofloxacin (LEVAQUIN) 750 mg in D5W IVPB Premix
750 | INTRAVENOUS | Status: DC
Start: 2016-01-31 — End: 2016-02-04
  Administered 2016-02-01: 19:00:00 750150 mg via INTRAVENOUS
  Administered 2016-02-01 – 2016-02-02 (×2): 750 mg via INTRAVENOUS
  Administered 2016-02-02: 21:00:00 750150 mg via INTRAVENOUS
  Administered 2016-02-03: 18:00:00 750 mg via INTRAVENOUS
  Administered 2016-02-03 – 2016-02-04 (×2): 750150 mg via INTRAVENOUS
  Administered 2016-02-04: 19:00:00 750 mg via INTRAVENOUS

## 2016-01-31 MED ORDER — sterile water (bottle) irrigation 30 mL
Status: DC | PRN
Start: 2016-01-31 — End: 2016-02-08

## 2016-01-31 MED ORDER — midazolam 50 mg in NaCl 0.9 % 50 mL (1 mg/mL) premix infusion
1 | INTRAVENOUS | Status: DC | PRN
Start: 2016-01-31 — End: 2016-02-11
  Administered 2016-01-31 (×4): 1 mg/h via INTRAVENOUS

## 2016-01-31 MED ORDER — sodium bicarbonate 1 mEq/mL (8.4 %) injection 50 mEq
8.4 | Freq: Once | INTRAVENOUS | Status: AC
Start: 2016-01-31 — End: 2016-01-30
  Administered 2016-01-31: 05:00:00 8.4 meq via INTRAVENOUS

## 2016-01-31 MED ORDER — sterile water (bottle) irrigation 30 mL
Status: DC
Start: 2016-01-31 — End: 2016-02-03
  Administered 2016-02-01 – 2016-02-04 (×18)

## 2016-01-31 MED FILL — PRISMASOL BGK K (2 MEQ/L)-MG(1MEQ/L) HEMODIALYSIS SOLUTION: ARTERIOVENOUS_FISTULA | Qty: 5

## 2016-01-31 MED FILL — NOREPINEPHRINE BITARTRATE 4 MG/250 ML (16 MCG/ML) IN DEXTROSE 5 % IV: 4 mg/250 mL (16 mcg/mL) | INTRAVENOUS | Qty: 250

## 2016-01-31 MED FILL — MIDAZOLAM 1 MG/ML IN 0.9 % NACL IVPB PREMIX: 1 mg/mL | INTRAVENOUS | Qty: 50

## 2016-01-31 MED FILL — HEPARIN (PORCINE) 1,000 UNIT/ML INJECTION VIAL: 1000 [IU]/mL | INTRAMUSCULAR | Qty: 2

## 2016-01-31 MED FILL — HEPARIN (PORCINE) 1,000 UNIT/ML INJECTION VIAL: 1000 [IU]/mL | INTRAMUSCULAR | Qty: 1

## 2016-01-31 MED FILL — MILRINONE 20 MG/100 ML(200 MCG/ML) IN 5 % DEXTROSE INTRAVENOUS PIGGYBK: 200 ug/mL | INTRAVENOUS | Qty: 100

## 2016-01-31 MED FILL — FENTANYL (PF) IN 0.9 % NACL 1250 MCG/250 ML PREMIX INFUSION: 1250 mcg/250 mL (5 mcg/mL) | INTRAVENOUS | Qty: 250

## 2016-01-31 MED FILL — PHENYLEPHRINE 10 MG/ML INJECTION SOLUTION: 10 mg/mL | INTRAMUSCULAR | Qty: 4

## 2016-01-31 MED FILL — LEVOFLOXACIN IN D5W 750 MG/150 ML IVPB PREMIX: 750 mg/150 mL | INTRAVENOUS | Qty: 150

## 2016-01-31 MED FILL — DEXMEDETOMIDINE 100 MCG/ML INTRAVENOUS SOLUTION: 100 ug/mL | INTRAVENOUS | Qty: 4

## 2016-01-31 MED FILL — SODIUM BICARBONATE 8.4 % (1 MEQ/ML) INTRAVENOUS SOLUTION: 8.4 % (1 mEq/mL) | INTRAVENOUS | Qty: 50

## 2016-01-31 MED FILL — SODIUM BICARBONATE 8.4 % (1 MEQ/ML) INTRAVENOUS SYRINGE: 8.4 % (1 mEq/mL) | INTRAVENOUS | Qty: 50

## 2016-01-31 MED FILL — FAMOTIDINE (PF) IN 0.9% NACL 20 MG/50 ML IVPB PREMIX: 20 mg/50 mL | INTRAVENOUS | Qty: 50

## 2016-01-31 NOTE — Procedures (Signed)
Critical Care Procedure Note:    Procedure: Arterial Line Insertion  Date of Procedure: 01/31/2016   Indication: Continuous hemodynamic monitoring    Procedure Description:   A Time out was performed prior to starting the procedure. The skin overlying the left radial artery was cleansed with chlorhexidine. Full barrier precautions were used during the insertion of the line. Bedside ultrasound was used during insertion of the line. A needle was advanced into the artery with return of arterial blood. A wire was advanced through the needle into the artery and the needle was withdrawn. Using Seldinger technique, the arterial catheter was advanced over the guidewire into the artery.  The guidewire was removed and there was flow of arterial blood through the catheter.  The catheter was connected to a transducer and sutured in position.  The patient tolerated the procedure well with no complications. Blood loss was less than 2ml.    Consuella Lose, NP  Acute Care Nurse Practitioner

## 2016-01-31 NOTE — Progress Notes (Signed)
Respiratory End of Shift Summary   01/31/2016                    5:13 AM         Sensorium     Intubated and sedated    Breath Sounds   (changes during shift) Clear/diminished with occasional coarseness.      Response to therapy   (or changes in therapy) Increased aeration post tx. Alb MN Q4   Is cough productive or require suctioning (frequency, characteristics)       Suctioning small-moderate amounts of tan, red tinged, thick secretions from ETT.  Not requiring frequent suctioning.     Ventilator  (start & end of shift settings   and other relevant assessments)             Current settings: PRVC 480/20/40% Peep 5    No changes this shift    BiPAP   (settings, changes, tolerance, usage, continuous, sleep only) N/A    Weaning attempts/plans                 N/A    Skin Integrity No breakdown noted     Significant Events  (transport, procedures) N/A   Other Comments ETT secure at 24cm @ lips  Morning ABG: 7.35/38/123/20     Norris Cross, RT

## 2016-01-31 NOTE — Progress Notes (Signed)
END OF SHIFT SUMMARY:      SENSORIUM:  Pt is sedated and intubated   BS (and any changes with tx):  coarse  TREATMENTS (or changes in treatment):  Increased aeration  SUCTIONING (amount, color, tenacity):  Large amounts of thick yellow  VENTILATOR/BiPAP CHANGES: FIO2 to 30%    SBT/WEANING (settings, length of trial, toleration):  Not indicated  ETT ADVANCED/RETRACTED/SECURE:  secure  GOALS/PLAN:  Wean when indicated. Continue mech vent at this time.   ADVERSE EVENTS:  Pt gets tachypnic when suctioning, but settles shortly after

## 2016-01-31 NOTE — Consults (Signed)
Brief Nutrition Follow-Up:     Spoke with Dr. Epimenio Sarin, who states plan is for initiation of trickle feeds and orders are in place. Pt remains on two pressors and some bilious output from OGT per MD. Will cont to follow closely per high risk protocol.    Laqueta Due, RD

## 2016-01-31 NOTE — Progress Notes (Signed)
CARDIOTHORACIC SURGERY CRITICAL CARE NOTE     Name: Brandy [name]  DOB: 25-Apr-1956 60 y.o.  MRN: [id number]  CSN: [id number]    Brandy [name] is a 60 y.o. female patient. Patient was admitted with Respiratory failure (CMS/HCC)  Who had Procedure(s):  AORTIC VALVE REPLACEMENT; INTRA AORTIC BALLOON INSERTION.  4 Days Post-Op    HPI: The patient is a 60 y.o. female with the below medical co-morbid conditions.    ?  1. Severe symptomatic aortic stenosis                        Ejection fraction 30%     Found on TEE intra-op to be 15%                        Normal coronaries                        Predominant symptom dyspnea                     2. Severe Pulmonary Hypertension  3. Mild to Moderate Mitral regurgitation  4. Morbid obesity                        Weight loss 50 pounds in the last one year  5. respiratory insufficiency  6. Non-ST elevation MI                        The demand ischemia  7. Iron deficiency anemia                        CT chest abdomen and pelvis reviewed no abdominal masses                        Likely AVM secondary to aortic stenosis not a good candidate for sedation and EGD or colonoscopy well aortic stenosis is so critical  ?  Patient's a 60 year old female who presented the [facility] falls in respiratory distress unable to breathe.  There are her troponin was elevated she underwent oxygen therapy and was given Lasix.  Cardiac workup showed normal coronary arteries with depressed LV function on echo and severe aortic stenosis valve area less than 0.4 velocity 4.4 m/s with an EF 30%.  She was transfused 2 units of PRBCs A [facility] hospital with a hemoglobin 8.8.  His immobility volume overload from heart failure  ?  Patient hasn't been to the doctor in years she hasn't taken any medications nor does she take good care of herself.  [place] she is unable to consistently take meds.  She likes to use and asked to [employer] wood to heat her house.    ?  After a few  days of Lasix patient states that she was breathing much better she is 60% better per her suggestion.  I had a long talk with the family about the disease course why she was getting short of breath and discussion of heart failure.  They understand that she is given a need aortic valve replacement she wishes to have tissue valve I do think she is a better surgical candidate and have her given her age and risk.  ?  She has very poor dentition    POD #1  Patient has  been stable on balloon 1:2   Methylene blue last night SVR much better   Coronary perfusion good   Today I am managing acute heart failure with management of the IABP.    POD #2  removal of the intra-aortic balloon pump renal ultrasound completed showing minimal flow to the kidneys  patient still making urine  POD #3 CRRT started  POD #4 CRRT running acid base more stable pressures much better off NEO and LEVOPHED improved making urine    Subjective: making progress    Assessment & Plan:     Active Hospital Problems    Diagnosis SNOMED CT(R) Date Noted   . S/P AVR (aortic valve replacement) HISTORY OF AORTIC VALVE REPLACEMENT 01/27/2016   . Respiratory failure (CMS/HCC) RESPIRATORY FAILURE 01/23/2016   . Aortic stenosis, severe AORTIC VALVE STENOSIS 01/22/2016   . NSTEMI (non-ST elevated myocardial infarction) (CMS/HCC) ACUTE NON-ST SEGMENT ELEVATION MYOCARDIAL INFARCTION 01/21/2016   . Demand ischemia of myocardium (CMS/HCC) ACUTE ISCHEMIC HEART DISEASE 01/21/2016   . Iron deficiency anemia IRON DEFICIENCY ANEMIA 01/21/2016   . Obesity OBESITY 01/21/2016     Neurologic:  Followed commands after surgery sedated now     Cardiac: Pre-op EF:   EF 15% prior to surgery   Post op 30% with good valve functdion 23mm valve     1. Inotropes EPI/Milrinone Continue for heart function support   2. Pressors: Switch to levophed today          3. Rhythm: Normal sinus rhythum with intact AVN   4. Aspirin: 300 rectal   5. Beta-Blocker: hold   6. Statin: hold   7. ACE/ARB:  hold        Respiratory:    Smoker none   Pulmonary toilet    Xray: Very congested    Gastrointestinal:    Genitourinary: creat: pre op 0.9   Now 1.16 >2.6 up to 3.4>3.26    Infectious Disease:  WBC: 17>18> 19.4>16.8    Hematology: HGB 9.8>8.6>7.7   PLTS 180> 91>64>32 not concerned about HITT     Endocrine: Glucose management   Electrolytes: replacing Mg/K/Ca  Prophylaxis: Heparin PPI/H2 Blocker     Scheduled Medications:   . albuterol  2.5 mg Nebulization Q4H   . aspirin  300 mg Rectal Daily   . chlorhexidine  15 mL Mouth/Throat Q12H   . famotidine  20 mg Intravenous Daily   . levofloxacin (LEVAQUIN) IV  750 mg Intravenous Q48H   . phenylephrine       . phenylephrine       . sodium chloride 0.9 % (NS) syringe  10 mL Intravenous Q8H SCH     Infusions:   . clevidipine     . dexmedetomidine Stopped (01/29/16 1123)   . dextrose 10% (D10W)     . dextrose 5% lactated ringers Stopped (01/29/16 1829)   . DOBUTamine (DOBUTREX) 250 mg in D5W 250 mL (1000 mcg/mL) infusion     . fentanyl 100 mcg/hr (01/30/16 0655)   . heparin (porcine) 20,000 Units (01/30/16 1904)   . insulin (HUMAN R) 150 Units in NaCl 0.9 % 150 mL (1 unit/mL) infusion - cardiac surgery Stopped (01/29/16 1841)   . midazolam 3 mg/hr (01/30/16 2343)   . milrinone 0.375 mcg/kg/min (01/30/16 2342)   . nitroglycerin     . norepinephrine 10 mcg/min (01/31/16 0615)   . phenylephrine (NEO-SYNEPHRINE) 40 mg in NaCl 0.9 % 500 mL (80 mcg/mL) infusion **DOUBLE CONCENTRATION** Stopped (01/31/16 0515)   . prismaSOL with  additives 1,500 mL/hr at 01/31/16 0513   . prismaSOL with additives 500 mL/hr at 01/30/16 1928   . prismaSOL with additives 500 mL/hr at 01/30/16 1928   . vasopressin (PITRESSIN) 50 units in NaCl 0.9 % 250 mL (0.2 units/mL) infusion **ADULT SHOCK** Stopped (01/28/16 1711)     Recent Labs:  Lab Results   Component Value Date    NA 133 (L) 01/31/2016    NA 133 (L) 01/31/2016    NA 133 (L) 01/31/2016    K 4.2 01/31/2016    K 4.2 01/31/2016    K 4.2 01/31/2016      K 3.69 01/23/2016    CL 103 01/31/2016    CL 103 01/31/2016    CL 103 01/31/2016    CL 104.1 01/23/2016    CO2 21.9 01/31/2016    CO2 21.9 01/31/2016    CO2 21.9 01/31/2016    CO2 20 (L) 01/23/2016    GLU 97 01/31/2016    GLU 97 01/31/2016    GLU 97 01/31/2016    GLU 85 01/23/2016    BUN 31 (H) 01/31/2016    BUN 31 (H) 01/31/2016    BUN 31 (H) 01/31/2016    CREATININES 3.26 (H) 01/31/2016    CREATININES 3.26 (H) 01/31/2016    CREATININES 3.26 (H) 01/31/2016    CALCIUM 9.0 01/31/2016    CALCIUM 9.0 01/31/2016    CALCIUM 9.0 01/31/2016    AST 39 01/31/2016    ALT 4 (L) 01/31/2016    ALKPHOS 91 01/31/2016    BILITOT 0.90 01/21/2016    ALBUMIN 2.4 (L) 01/31/2016    ALBUMIN 2.4 (L) 01/31/2016    GLOB 2.2 01/31/2016    AGRATIO 1.1 01/31/2016    ANIONGAP 8.1 01/31/2016    ANIONGAP 8.1 01/31/2016    ANIONGAP 8.1 01/31/2016    ANIONGAP 21.0 (H) 01/23/2016    LABGLOM 14 (L) 01/31/2016    LABGLOM 14 (L) 01/31/2016    LABGLOM 14 (L) 01/31/2016    LABGLOM >60.0 01/23/2016    WHITEBLOODCE 16.8 (H) 01/31/2016    LABPLAT 32 (L) 01/31/2016    HGB 7.7 (L) 01/31/2016    PROTIME 18.7 (H) 01/26/2016    INR 1.7 (H) 01/26/2016    APTT 50 (H) 01/31/2016          Vital Signs:      ? Cardiac output CCO: 5.1 L/Min    ? Cardiac Index CCI: 2.3 L/min  Temp: 37.1 ?C (98.8 ?F) BP: (!) 117/57 Pulse: (!) 130 Resp: 23 SpO2: 100 % on   Ventilator   Intake/Output:     CT OUTPUT: 365    URINE OUTPUT: 802    Exam:  Neuro:no TIA or stroke-like symptoms  Cardiac:regular rate and rhythm, S1, S2 normal, no murmur, click, rub or gallop  Lungs;clear  ABDOMEN:abdomen is soft without significant tenderness, masses, organomegaly or guarding  EXTREMITIES:less then 2 second capillary refill    PLAN:   Continue CRRT  Critical still but improvement today   Really appreciate renal consultants help and ICU team too    Critical Care Time: 40 minutes outside any procedures and family discussion.  Patient remains critical and at high risk for organ failure and  death.      Otho Bellows

## 2016-01-31 NOTE — Progress Notes (Signed)
PHARMACIST CRITICAL CARE PROGRESS NOTE     Consult Per P&T Protocol: Critical Care Physician Service, Antimicrobial Interventions, Renal Dosing, IV to PO         Summary: Brandy [name] is [name] admitted on 01/23/2016 with a NSTEMI s/p R/L heart craterization.     Subjective: I visited the patient, but was unable to explain the pharmacist plan of care to the patient due to sedation and/or intubation.    Allergies: Sulfa (sulfonamide antibiotics)    PMH:  has a past medical history of History of blood transfusion.     Surgical history:  has a past surgical history that includes Tonsillectomy; Tubal ligation; Tubal ligation (1980); and ocedure (N/A, 01/27/2016).    Active problems:   Patient Active Problem List    Diagnosis SNOMED CT(R) Date Noted   . S/P AVR (aortic valve replacement) HISTORY OF AORTIC VALVE REPLACEMENT 01/27/2016   . Non-ischemic cardiomyopathy (CMS/HCC) CARDIOMYOPATHY 01/23/2016   . Respiratory failure (CMS/HCC) RESPIRATORY FAILURE 01/23/2016   . Aortic stenosis, severe AORTIC VALVE STENOSIS 01/22/2016   . Acute systolic heart failure (CMS/HCC) ACUTE SYSTOLIC HEART FAILURE 01/22/2016   . Heart failure (CMS/HCC) HEART FAILURE 01/21/2016   . Elevated brain natriuretic peptide (BNP) level HORMONE LEVEL - FINDING 01/21/2016   . NSTEMI (non-ST elevated myocardial infarction) (CMS/HCC) ACUTE NON-ST SEGMENT ELEVATION MYOCARDIAL INFARCTION 01/21/2016   . Demand ischemia of myocardium (CMS/HCC) ACUTE ISCHEMIC HEART DISEASE 01/21/2016   . Tachycardia TACHYCARDIA 01/21/2016   . Pleural effusion PLEURAL EFFUSION 01/21/2016   . Obesity OBESITY 01/21/2016   . Acute respiratory failure with hypoxia (CMS/HCC) ACUTE RESPIRATORY FAILURE 01/21/2016   . Hypotension LOW BLOOD PRESSURE 01/21/2016   . Iron deficiency anemia IRON DEFICIENCY ANEMIA 01/21/2016   . Thrombocytosis (CMS/HCC) THROMBOCYTOSIS 01/21/2016   . Weight loss WEIGHT DECREASED 01/21/2016   . Family history of cancer FAMILY HISTORY OF CANCER  01/21/2016   . Cardiomegaly CARDIOMEGALY 01/21/2016   . Mediastinal lymphadenopathy MEDIASTINAL LYMPHADENOPATHY 01/21/2016   . Hilar lymphadenopathy- bilataeral  HILAR LYMPHADENOPATHY 01/21/2016   . Chest pain CHEST PAIN 01/21/2016   . Dyspnea and respiratory abnormality DYSPNEA 01/21/2016   . Abnormal EKG ELECTROCARDIOGRAM ABNORMAL 01/21/2016   . Opacity of lung on imaging study ABNORMAL FINDINGS ON DIAGNOSTIC IMAGING OF LUNG 01/21/2016   . Liver lesion- small hypodense lesion around falciform lig LESION OF LIVER 01/21/2016       OBJECTIVE:     Diet Orders:   Procedures   . Diet, NPO       Height: 167.6 cm (66) Admit weight: 101.4 kg (223 lb 8.7 oz) Last charted weight: 114.8 kg (253 lb 1.4 oz) IBW: 59.3 kg Body mass index is 40.85 kg/(m^2).    I/O last 3 completed shifts:  In: 5078 [I.V.:5078]  Out: 3160 [Urine:1497; Other:1118; Chest Tube:545]    Recent Labs      01/31/16   0051  01/31/16   0410  01/31/16   1003   NA  135  133*  133*  133*  133*   K  4.2  4.2  4.2  4.2  4.1   CL  105  103  103  103  102   CO2  21.3  21.9  21.9  21.9  23.0   GLU  93  97  97  97  106*   BUN  35*  31*  31*  31*  27*   CREATININES  3.59*  3.26*  3.26*  3.26*  2.78*  CALCIUM  9.0  9.0  9.0  9.0  9.0   AST   --   39   --    ALT   --   4*   --    ALBUMIN  2.4*  2.4*  2.4*  2.4*     Recent Labs      01/31/16   0051  01/31/16   0410  01/31/16   1002   CAION  1.34*  1.31*  1.31*     Recent Labs      01/30/16   1634  01/31/16   0051  01/31/16   0410  01/31/16   1003   MG  2.0  2.0  1.8  1.8   --    PHOS  4.0  4.0  3.5  3.2  3.2  2.9     Recent Labs      01/29/16   1743  01/29/16   1840  01/29/16   2006  01/29/16   2214  01/30/16   0410  01/31/16   0959   POCGLU  116*  106*  100*  99  104*  90     Recent Labs      01/30/16   0401  01/30/16   1635  01/31/16   0410   WHITEBLOODCE  19.4*  20.3*  16.8*   NEUTROABS  16.2*  17.6*  14.8*   HGB  8.7*  8.6*  7.7*   HCT  28.3*  27.9*  25.1*   LABPLAT  64*  50*  32*       Temp  (24hrs), Avg:37.2 ?C (98.9 ?F), Min:36.8 ?C (98.2 ?F), Max:37.9 ?C (100.2 ?F)      Relevant Microbiology:  Date: Source: Results:  Status:   8/20 Bronc Wash  Viral/Fungal  Pending   8/20 BAL NGTD Pending   8/19 Blood x 4 NGTD Pending   8/19 ET Asp NGTD Pending     ASSESSMENT:     Estimated CrCl: CRRT (started 8/20 PM)    Critical Care Prophylaxis:  VAP: Peridex 0.12% 15 mL BID   VTE: PLT 32   SCDs   SUP: Pepcid 20 mg IV daily     CBG Monitoring:   ? CBG goal in critically ill: 140-180  ? CBG in past 24H: 106-96    Propofol/Triglyceride Monitoring:  ? Not on propofol    Antimicrobial Monitoring:   Indication for antimicrobials: Sepsis of unknown origin (per Intensivist 8/19)   Current antimicrobials: Levaquin 750 mg IV Q24H (8/21-current)   Relevant previous antimicrobials: Levaquin 750 mg IV Q48H (8/19)   # of days of therapy directed at source of infection or pathogen: Day 3   Comments about duration? Dependent on source  General sepsis: 7-10 days (Surviving Sepsis)   Notes about antimicrobial therapy or microbiology data?        PLAN:     Critical Care Prophylaxis:  ? Prophylactic medications ordered    ? Post op thrombocytopenia PLT 32, Lovenox D/C-ed 8/20  ? Monitor for s/sx of bleeding    Antimicrobial Interventions:   ? Levaquin started empirically for sepsis  ? Will follow    Renal Dosing:   ? CRRT started 8/20 in PM  ? Change Levaquin to 750 mg IV Q24H      IV to PO:   ? No switches at the moment       Signed by: Carylon Perches, PharmD, BCPS, BCCCP

## 2016-01-31 NOTE — Progress Notes (Signed)
Critical Care Progress Note:    Subjective:    The patient is a 60 year non smoking female with BMI 40 and progressive dyspnea since June.     8/11 Admitted to [facility] on 8/11 with respiratory distress, pulmonary edema, NSTEMIC, anemia requiring 2 units PRBCs. ECHO showed severe aortic stenosis, EF 30%. Heart cath demonstrated no significant coronary occlusion.      8/13 Transferred to [facility] on 8/13.     8/17 Dr. Debroah Baller performed aortic valve replacement and right femoral IABP placement. Intraoperative EF was noted to be 15%. The patient was brought to the CCU post operatively. She required multiple vasopressors and inotropes and also received methylene blue. Nebulized flolan for pHTN.     8/18 IABP was removed.     8/19 Swan clotted and was removed. Levaquin started. Flolan stopped.     8/20 Temp HD catheter was placed on and CRRT initiated. Bronchoscopy performed due to fever.     Today the patient remains intubated. Milrinone is running at 0.375 mcg/kg/min and levophed at 10 mcg/min.     Medications: Reviewed    Objective:    Physical Exam:    Vitals:    01/31/16 0830   BP: 111/63   Pulse: (!) 128   Resp: 21   Temp:    SpO2: 100%         Intake/Output Summary (Last 24 hours) at 01/31/16 1021  Last data filed at 01/31/16 0959   Gross per 24 hour   Intake           3940.6 ml   Output             2541 ml   Net           1399.6 ml       Weight: 114.8 kg (253 lb 1.4 oz)    Gen:  60 y.o.yr old  female lying in bed in no acute distress.  HEENT: Atraumatic  Neck: cordis  Chest: sternotomy/drains  Cardiac: s1 s2  Abdm: obese, nontender  GU: foley  Ext: Digits dusky right hand  Neuro: Appears non-focal    Continuous Infusions:  . clevidipine     . dexmedetomidine 0.2 mcg/kg/hr (01/31/16 0941)   . dextrose 10% (D10W)     . dextrose 5% lactated ringers Stopped (01/29/16 1829)   . DOBUTamine (DOBUTREX) 250 mg in D5W 250 mL (1000 mcg/mL) infusion     . fentanyl 100 mcg/hr (01/30/16 0655)   . heparin (porcine) 20,000  Units (01/30/16 1904)   . insulin (HUMAN R) 150 Units in NaCl 0.9 % 150 mL (1 unit/mL) infusion - cardiac surgery Stopped (01/29/16 1841)   . midazolam 3 mg/hr (01/30/16 2343)   . milrinone 0.375 mcg/kg/min (01/31/16 0900)   . nitroglycerin     . norepinephrine 10 mcg/min (01/31/16 0615)   . phenylephrine (NEO-SYNEPHRINE) 40 mg in NaCl 0.9 % 500 mL (80 mcg/mL) infusion **DOUBLE CONCENTRATION** Stopped (01/31/16 0515)   . prismasate with additives 1,500 mL/hr at 01/31/16 0847   . prismasate with additives 500 mL/hr at 01/31/16 0833   . prismasate with additives 500 mL/hr at 01/31/16 0827   . vasopressin (PITRESSIN) 50 units in NaCl 0.9 % 250 mL (0.2 units/mL) infusion **ADULT SHOCK** Stopped (01/28/16 1711)       Meds:  . albuterol  2.5 mg Nebulization Q4H   . aspirin  300 mg Rectal Daily   . chlorhexidine  15 mL Mouth/Throat Q12H   . famotidine  20 mg Intravenous Daily   . levofloxacin (LEVAQUIN) IV  750 mg Intravenous Q48H   . phenylephrine       . phenylephrine       . sodium chloride 0.9 % (NS) syringe  10 mL Intravenous Q8H SCH       Ventilator:   Vent Mode: PRVC/AC  FiO2 (%):  [40-100] 40  S RR:  [20] 20  S VT:  [480 mL] 480 mL  PEEP/CPAP (cm H2O):  [5 cm H20] 5 cm H20  MAP (cm H2O):  [7-11] 9  O2 Device: Ventilator    LABS:  Recent Labs      01/30/16   0401  01/30/16   1635  01/31/16   0410   HGB  8.7*  8.6*  7.7*     Recent Labs      01/30/16   2117  01/31/16   0051  01/31/16   0410   NA  131*  135  133*  133*  133*   K  4.2  4.2  4.2  4.2  4.2   CL  103  105  103  103  103   CO2  19.1*  21.3  21.9  21.9  21.9   BUN  37*  35*  31*  31*  31*   GLU  96  93  97  97  97     Recent Labs      01/31/16   0410   AST  39   ALT  4*   ALKPHOS  91     No results for input(s): INR, PTT in the last 72 hours.  Recent Labs      01/30/16   1114  01/30/16   2117  01/31/16   0427   PO2  104.4*  98.8  123.3*   PEEP  5.0  5.0  5.0       GLUCOSE:  Recent Labs      01/29/16   1840  01/29/16   2006  01/29/16   2214   01/30/16   0410  01/31/16   0959   POCGLU  106*  100*  99  104*  90       Imaging:   X-ray Chest Pa Or Ap    Result Date: 01/31/2016  CHEST 1 VIEW 01/31/2016 4:15 AM PROVIDED CLINICAL INDICATIONS:  Assess tube and line placement - post-op cardiac surgery    ADDITIONAL CLINICAL HISTORY:  None. TECHNIQUE:  AP view of the chest was obtained COMPARISON:  01/30/2016 FINDINGS:  Moderate bilateral pleural and parenchymal disease is present with central vascular congestion. The degree of aeration is probably slightly worsened. No significant pneumothorax is detected. Mild cardiomegaly is present. The superior mediastinal silhouette is unremarkable. The endotracheal tube, NG tube, bilateral venous access catheters, bilateral chest tubes and mediastinal drain are all again noted.     IMPRESSION: 1. Moderately severe bilateral pleural and parenchymal disease with central vascular congestion probably slightly worsened. 2. Mild cardiomegaly.     X-ray Chest Pa Or Ap    Result Date: 01/30/2016  CHEST 1 VIEW 01/30/2016 4:17 PM PROVIDED CLINICAL INDICATIONS:  central line placement    ADDITIONAL CLINICAL HISTORY:  None. COMPARISON:  01/30/2016 TECHNIQUE:  1 view FINDINGS:  A LEFT transjugular central catheter has been placed. The tip lies just above the superior cavoatrial junction. The multiple life support tubes and catheters are otherwise stable in positions. Lung volumes are increased. Patchy bilateral areas of pulmonary opacity  are mildly decreased.  No new areas of abnormal opacity of occurred.  Cardiomediastinal silhouette has decreased in size. There is also decreased pulmonary vascular congestion.  There are no acute bony abnormalities.         IMPRESSION:  1. Placement of a LEFT transjugular central catheter with the tip just above the superior cavoatrial junction. 2. Improved appearance of the chest. There is decreasing patchy bilateral pulmonary opacities and decreasing pulmonary vascular congestion.        Assessment    Active Hospital Problems    Diagnosis SNOMED CT(R) Date Noted   . S/P AVR (aortic valve replacement) HISTORY OF AORTIC VALVE REPLACEMENT 01/27/2016   . Respiratory failure (CMS/HCC) RESPIRATORY FAILURE 01/23/2016   . Aortic stenosis, severe AORTIC VALVE STENOSIS 01/22/2016   . NSTEMI (non-ST elevated myocardial infarction) (CMS/HCC) ACUTE NON-ST SEGMENT ELEVATION MYOCARDIAL INFARCTION 01/21/2016   . Demand ischemia of myocardium (CMS/HCC) ACUTE ISCHEMIC HEART DISEASE 01/21/2016   . Iron deficiency anemia IRON DEFICIENCY ANEMIA 01/21/2016   . Obesity OBESITY 01/21/2016     IMPRESSION    AVR 8/17 for severe aortic stenosis  Intra-op EF 15%  Acute hypoxemic respiratory failure  Acute kidney injury  Pulmonary HTN  Iron deficiency anemia, suspected occult GI loss  Fever, query pneumonia  13.5 liters net positive    Plan:    Neurologic: Sedation holiday for neuro assessment.     Pulmonary: VAP preventive measures. Persistent circulatory shock and encephalopathy preclude extubation at this time.     Cardiac: Plan per CT surgery.  New aline as fingers a bit dusky distal to right radial aline.     ID: Levaquin for possible pneumonia. Follow cultures.     Renal: CRRT per nephrology.     GI: NPO. Famotidine.     Endocrine: Insulin as needed to maintain euglycemia.     Heme: H/H  7/25   Platelets 31. Monitor counts.     Prophylaxis: HOB elevation, Peridex Mouthwash, SCDs, famotdine    Code Status: full    This patient remains at very high risk of organ failure, deterioration, and/or death and continues to require critical care management. I visited with the husband, shared my assessment, answered his questions. I reviewed the case with the critical care staff.     Critical Care Time, excluding billable procedures: 55  mins    Casimiro Needle Gennifer Potenza,MD

## 2016-01-31 NOTE — Progress Notes (Signed)
Daily Progress Note     Name: Brandy [name]  DOB: Nov 09, 1955 60 y.o.  MRN: [id number]  CSN: [id number]    Assessment & Plan:     Active Hospital Problems    Diagnosis SNOMED CT(R) Date Noted   . S/P AVR (aortic valve replacement) HISTORY OF AORTIC VALVE REPLACEMENT 01/27/2016   . Respiratory failure (CMS/HCC) RESPIRATORY FAILURE 01/23/2016   . Aortic stenosis, severe AORTIC VALVE STENOSIS 01/22/2016   . NSTEMI (non-ST elevated myocardial infarction) (CMS/HCC) ACUTE NON-ST SEGMENT ELEVATION MYOCARDIAL INFARCTION 01/21/2016   . Demand ischemia of myocardium (CMS/HCC) ACUTE ISCHEMIC HEART DISEASE 01/21/2016   . Iron deficiency anemia IRON DEFICIENCY ANEMIA 01/21/2016   . Obesity OBESITY 01/21/2016     --AKI: ATN secondary to cardiogenic shock and possible sepsis from nosocomial pneumonia.  Nonoliguric, but unable to diurese with IV lasix monotherapy. No firm indications for initiation of CRRT this morning, but patient is at high risk to develop indication for renal replacement therapy.  CXR concerning for pulmonary edema, but infiltrates may be infectious.     --Hyperkalemia: Improved  --Metabolic Acidosis: Improved   --Pneumonia: On Levaquin, now afebrile.  --S/P AVR:  Complicated by cardiogenic shock.  Hemodynamics improving.  Weaning pressor support.    Plan:   Continue CRRT.    UF goal of 0-50 ml/hr    Subjective:     Brandy [name] is a 60 y.o. female patient. Patient was admitted with critical AS s/p AVR complicated by cardiogenic shock.  Remains intubated on FiO2 at 40%.  On Levophed.  CRRT with Heparin running well.     Scheduled Medications:   . albuterol  2.5 mg Nebulization Q4H   . aspirin  300 mg Rectal Daily   . chlorhexidine  15 mL Mouth/Throat Q12H   . famotidine  20 mg Intravenous Daily   . levofloxacin (LEVAQUIN) IV  750 mg Intravenous Q48H   . phenylephrine       . phenylephrine       . sodium chloride 0.9 % (NS) syringe  10 mL Intravenous Q8H SCH     Infusions:   . clevidipine     .  dexmedetomidine Stopped (01/29/16 1123)   . dextrose 10% (D10W)     . dextrose 5% lactated ringers Stopped (01/29/16 1829)   . DOBUTamine (DOBUTREX) 250 mg in D5W 250 mL (1000 mcg/mL) infusion     . fentanyl 100 mcg/hr (01/30/16 0655)   . heparin (porcine) 20,000 Units (01/30/16 1904)   . insulin (HUMAN R) 150 Units in NaCl 0.9 % 150 mL (1 unit/mL) infusion - cardiac surgery Stopped (01/29/16 1841)   . midazolam 3 mg/hr (01/30/16 2343)   . milrinone 0.375 mcg/kg/min (01/30/16 2342)   . nitroglycerin     . norepinephrine 10 mcg/min (01/31/16 0615)   . phenylephrine (NEO-SYNEPHRINE) 40 mg in NaCl 0.9 % 500 mL (80 mcg/mL) infusion **DOUBLE CONCENTRATION** Stopped (01/31/16 0515)   . prismaSOL with additives 1,500 mL/hr at 01/31/16 0513   . prismaSOL with additives 500 mL/hr at 01/30/16 1928   . prismaSOL with additives 500 mL/hr at 01/30/16 1928   . vasopressin (PITRESSIN) 50 units in NaCl 0.9 % 250 mL (0.2 units/mL) infusion **ADULT SHOCK** Stopped (01/28/16 1711)     PRN Medications: acetaminophen (TYLENOL) suppository, acetaminophen, calcium chloride IVPB, calcium gluconate IVPB, clevidipine, dextrose 5% lactated ringers, dextrose, DOBUTamine (DOBUTREX) 250 mg in D5W 250 mL (1000 mcg/mL) infusion, EPINEPHrine, fentanyl, heparin (porcine), hydrALAZINE, HYDROcodone-acetaminophen, insulin (HUMAN R) 150 Units in  NaCl 0.9 % 150 mL (1 unit/mL) infusion - cardiac surgery, magnesium sulfate IVPB, magnesium sulfate IVPB, magnesium sulfate IVPB, meperidine (PF), midazolam, milrinone, morphine, nitroglycerin, norepinephrine, oxyCODONE, phenylephrine (NEO-SYNEPHRINE) 40 mg in NaCl 0.9 % 500 mL (80 mcg/mL) infusion **DOUBLE CONCENTRATION**, potassium chloride in NS infusion 20 mEq/250 mL, protamine, sodium chloride, sodium chloride 0.9 % (NS) syringe, sodium chloride 0.9 % (NS) syringe, sodium phosphate IVPB    Objective:     Vital Signs:    Vital Sign Ranges for Last 24 Hours:  BP  Min: 107/49  Max: 132/62  Temp  Min: 36.4 ?C  (97.5 ?F)  Max: 37.9 ?C (100.2 ?F)  Pulse  Min: 97  Max: 142  Resp  Min: 18  Max: 33  SpO2  Min: 97 %  Max: 100 %    Most Recent Vitals  BP: 117/57  Pulse: 130  Resp: 23  Temp: 37.1 ?C (98.8 ?F)  SpO2: 100 % (08/21 0400-08/21 0706)    Intake/Ouput:    Intake/Output Summary (Last 24 hours) at 01/31/16 0828  Last data filed at 01/31/16 0759   Gross per 24 hour   Intake           3735.1 ml   Output             2420 ml   Net           1315.1 ml        Exam:  NAD, intubated  Coarse UA BS, CT in place  RRR  Abd: Obese, nontender  Ext: No edema  Skin: No rash.    Recent Labs:  Results for orders placed or performed during the hospital encounter of 01/23/16 (from the past 24 hour(s))   Blood Gas-Arterial -See Comments for Frequency    Collection Time: 01/30/16 11:14 AM   Result Value Ref Range    ABG PH (ASA) 7.368 7.350 - 7.450    PCO2 Arterial Blood Gas 35.3 35.0 - 45.0 mmHg    pO2 Arterial Blood Gas 104.4 (H) 80.0 - 100.0 mmHg    HCO3 Arterial Blood Gas 19.9 (L) 22.0 - 26.0 mmol/L    Base Excess -4.9 (L) -2.0 - 2.0 mmol/L    tHb 9.9 (L) 12.0 - 16.0 g/dL    N8GN 56.2 13.0 - 86.5 %    Carboxyhemoglobin 0.7 0.0 - 1.5 %    Methemoglobin 0.3 0.0 - 1.0 %    O2 Saturation 97.8 92.0 - 98.5 %    a/A Ratio 0.45     ABG AA GRADIENT (ASA) 127.0 mmHg    Patient Temperature 37.0 C    Allen's Test NA     Sample Type Blood     Specimen Site AL     O2 Delivery Device Ventilator     FIO2 40     Ventilator Mode PRVC     Minute Ventilation 11.25     Tidal Volume 480.0 mL    Respiratory Rate 20.0 b/min    Peep 5.0 cmH2O    Drawn By rn     Puncture Attempts 1     Time Analyzed [id number]     Analyzed By Margretta Ditty    Basic Metabolic Panel -Q4H    Collection Time: 01/30/16 11:56 AM   Result Value Ref Range    Sodium 133 (L) 135 - 143 mmol/L    Potassium 4.1 3.5 - 5.1 mmol/L    Chloride 104 98 - 111 mmol/L    CO2 - Carbon Dioxide  19.5 (L) 21.0 - 31.0 mmol/L    Glucose 96 80 - 99 mg/dL    BUN 36 (H) 6 - 23 mg/dL    Creatinine 1.61 (H) 0.55  - 1.10 mg/dL    Calcium 9.5 8.6 - 09.6 mg/dL    Anion Gap 9.5 3.0 - 11.0 mmol/L    GFR Estimate 12 (L) >=60 mL/min/1.35m*2    GFR Additional Info     Cell Count with Differential, Body Fluid -STAT    Collection Time: 01/30/16  1:25 PM   Result Value Ref Range    Body Fluid Color Pink     Body Fluid Appearance Cloudy     Specimen Type Bronchoalveolar Lavage     Total Nucleated Cells Body Fluid 208 /cumm    RBC Cell Count 604 /cumm   Cell Count with Differential, Body Fluid -STAT    Collection Time: 01/30/16  1:25 PM   Result Value Ref Range    Body Fluid Color Pink     Body Fluid Appearance Cloudy     Specimen Type Bronchoalveolar Lavage     Total Nucleated Cells Body Fluid 149 /cumm    RBC Cell Count 1901 /cumm   Respiratory culture -Next Routine    Collection Time: 01/30/16  1:25 PM   Result Value Ref Range    Gram Stain Cytospin     Gram Stain 1+ Polymorphonuclear leukocytes     Gram Stain 3+ Mononuclear Cells     Gram Stain No organisms seen    Respiratory culture -Next Routine    Collection Time: 01/30/16  1:25 PM   Result Value Ref Range    Gram Stain Rare Polymorphonuclear leukocytes     Gram Stain No organisms seen    Differential Panel, Body Fluid -Next Routine    Collection Time: 01/30/16  1:25 PM   Result Value Ref Range    Seg neutrophils, fluid 59 %    Lymphocytes Body Fluid 3 %    Monocytes/Macrophages Body Fluid 38 %    Total Cells Counted Body Fluid 100    Differential Panel, Body Fluid -Next Routine    Collection Time: 01/30/16  1:25 PM   Result Value Ref Range    Seg neutrophils, fluid 77 %    Lymphocytes Body Fluid 3 %    Monocytes/Macrophages Body Fluid 20 %    Total Cells Counted Body Fluid 100    Basic Metabolic Panel -Q4H    Collection Time: 01/30/16  4:34 PM   Result Value Ref Range    Sodium 131 (L) 135 - 143 mmol/L    Potassium 4.2 3.5 - 5.1 mmol/L    Chloride 105 98 - 111 mmol/L    CO2 - Carbon Dioxide 19.2 (L) 21.0 - 31.0 mmol/L    Glucose 92 80 - 99 mg/dL    BUN 38 (H) 6 - 23 mg/dL     Creatinine 0.45 (H) 0.55 - 1.10 mg/dL    Calcium 9.5 8.6 - 40.9 mg/dL    Anion Gap 6.8 3.0 - 11.0 mmol/L    GFR Estimate 12 (L) >=60 mL/min/1.69m*2    GFR Additional Info     Hepatitis B Surface Antigen -Next Routine    Collection Time: 01/30/16  4:34 PM   Result Value Ref Range    Hepatitis B Surface Antigen Interpretation Negative Negative   Hepatitis B Surface Ab -Next Routine    Collection Time: 01/30/16  4:34 PM   Result Value Ref Range    Hepatitis  B Surface Antibody Interpretation Non-Immune (A) Immune   Hepatitis B Core Ab (IgM) -Next Routine    Collection Time: 01/30/16  4:34 PM   Result Value Ref Range    Hepatitis B Core IgM Antibody Interpretation Negative Negative   Renal Function Panel -Next Routine    Collection Time: 01/30/16  4:34 PM   Result Value Ref Range    Albumin 2.5 (L) 3.5 - 5.0 g/dL    Calcium 9.5 8.6 - 16.1 mg/dL    Phosphorus 4.0 2.5 - 4.5 mg/dL    BUN 38 (H) 6 - 23 mg/dL    Creatinine 0.96 (H) 0.55 - 1.10 mg/dL    Sodium 045 (L) 409 - 143 mmol/L    Glucose 92 80 - 99 mg/dL    Potassium 4.2 3.5 - 5.1 mmol/L    Chloride 105 98 - 111 mmol/L    CO2 - Carbon Dioxide 19.2 (L) 21.0 - 31.0 mmol/L    Anion Gap 6.8 3.0 - 11.0 mmol/L    GFR Estimate 12 (L) >=60 mL/min/1.70m*2    GFR Additional Info     Magnesium -Next Routine    Collection Time: 01/30/16  4:34 PM   Result Value Ref Range    Magnesium 2.0 1.6 - 2.4 mg/dL   APTT -Next Routine    Collection Time: 01/30/16  4:34 PM   Result Value Ref Range    APTT 38 (H) 23 - 36 Seconds   Magnesium -Q6H    Collection Time: 01/30/16  4:34 PM   Result Value Ref Range    Magnesium 2.0 1.6 - 2.4 mg/dL   Ionized Calcium -W1X    Collection Time: 01/30/16  4:34 PM   Result Value Ref Range    Ionized Calcium 1.39 (H) 1.12 - 1.30 mmol/L   Renal Function Panel -Q6H    Collection Time: 01/30/16  4:34 PM   Result Value Ref Range    Albumin 2.5 (L) 3.5 - 5.0 g/dL    Calcium 9.6 8.6 - 91.4 mg/dL    Phosphorus 4.0 2.5 - 4.5 mg/dL    BUN 38 (H) 6 - 23 mg/dL     Creatinine 7.82 (H) 0.55 - 1.10 mg/dL    Sodium 956 (L) 213 - 143 mmol/L    Glucose 92 80 - 99 mg/dL    Potassium 4.3 3.5 - 5.1 mmol/L    Chloride 102 98 - 111 mmol/L    CO2 - Carbon Dioxide 19.4 (L) 21.0 - 31.0 mmol/L    Anion Gap 11.6 (H) 3.0 - 11.0 mmol/L    GFR Estimate 12 (L) >=60 mL/min/1.24m*2    GFR Additional Info     CBC with Auto Differential -Next Routine    Collection Time: 01/30/16  4:35 PM   Result Value Ref Range    WBC 20.3 (H) 4.8 - 10.8 10*3/?L    RBC 3.88 (L) 4.20 - 5.40 10*6/?L    Hemoglobin 8.6 (L) 12.0 - 16.0 g/dL    HCT 08.6 (L) 57.8 - 48.0 %    MCV 71.9 (L) 81.0 - 99.0 fL    MCH 22.2 (L) 27.0 - 34.0 pg    MCHC 30.9 (L) 32.0 - 36.0 g/dL    RDW 46.9 (H) 62.9 - 14.5 %    Platelet Count 50 (L) 150 - 400 10*3/?L    MPV 8.8 7.4 - 10.4 fL    Neutrophils % 87 (H) 35 - 70 %    Lymphocytes % 3 (L) 25 - 45 %  Monocytes % 9 0 - 12 %    Eosinophils % 0 0 - 7 %    Basophils % 1 0 - 2 %    Neutrophils, Absolute 17.6 (H) 1.6 - 7.3 10*3/?L    Lymphocytes, Absolute 0.6 (L) 1.1 - 4.3 10*3/?L    Monocytes, Absolute 1.9 (H) 0.0 - 1.2 10*3/?L    Eosinophils, Absolute 0.1 0.0 - 0.7 10*3/?L    Basophils, Absolute 0.1 0.0 - 0.2 10*3/?L    Differential Type Automated Differential    Legionella Antigen, Urine -Next Routine    Collection Time: 01/30/16  6:59 PM   Result Value Ref Range    Legionella by EIA Negative Negative   Strep. pneumoniae Antigen, Urine -Next Routine    Collection Time: 01/30/16  6:59 PM   Result Value Ref Range    Streptococcus pneumoniae Antigen, Urine Negative Negative   Blood Gas-Arterial -Once    Collection Time: 01/30/16  9:17 PM   Result Value Ref Range    ABG PH (ASA) 7.317 (L) 7.350 - 7.450    PCO2 Arterial Blood Gas 36.1 35.0 - 45.0 mmHg    pO2 Arterial Blood Gas 98.8 80.0 - 100.0 mmHg    HCO3 Arterial Blood Gas 18.1 (L) 22.0 - 26.0 mmol/L    Base Excess -7.4 (L) -2.0 - 2.0 mmol/L    tHb 9.9 (L) 12.0 - 16.0 g/dL    Z6XW 96.0 45.4 - 09.8 %    Carboxyhemoglobin 0.7 0.0 - 1.5 %     Methemoglobin 0.3 0.0 - 1.0 %    O2 Saturation 97.0 92.0 - 98.5 %    Patient Temperature 37.0 C    Allen's Test NA     Sample Type Blood     Specimen Site AL     O2 Delivery Device Ventilator     FIO2 40     Minute Ventilation 12     Tidal Volume 480.0 mL    Respiratory Rate 24.0 b/min    Peep 5.0 cmH2O    Drawn By rn     Puncture Attempts 1     Time Analyzed [id number]     Analyzed By Norris Cross    Basic Metabolic Panel -STAT    Collection Time: 01/30/16  9:17 PM   Result Value Ref Range    Sodium 131 (L) 135 - 143 mmol/L    Potassium 4.2 3.5 - 5.1 mmol/L    Chloride 103 98 - 111 mmol/L    CO2 - Carbon Dioxide 19.1 (L) 21.0 - 31.0 mmol/L    Glucose 96 80 - 99 mg/dL    BUN 37 (H) 6 - 23 mg/dL    Creatinine 0.86 (H) 0.55 - 1.10 mg/dL    Calcium 9.4 8.6 - 57.8 mg/dL    Anion Gap 8.9 3.0 - 11.0 mmol/L    GFR Estimate 11 (L) >=60 mL/min/1.63m*2    GFR Additional Info     Magnesium -Q6H    Collection Time: 01/31/16 12:51 AM   Result Value Ref Range    Magnesium 1.8 1.6 - 2.4 mg/dL   Ionized Calcium -I6N    Collection Time: 01/31/16 12:51 AM   Result Value Ref Range    Ionized Calcium 1.34 (H) 1.12 - 1.30 mmol/L   Renal Function Panel -Q6H    Collection Time: 01/31/16 12:51 AM   Result Value Ref Range    Albumin 2.4 (L) 3.5 - 5.0 g/dL    Calcium 9.0 8.6 - 62.9 mg/dL  Phosphorus 3.5 2.5 - 4.5 mg/dL    BUN 35 (H) 6 - 23 mg/dL    Creatinine 5.40 (H) 0.55 - 1.10 mg/dL    Sodium 981 191 - 478 mmol/L    Glucose 93 80 - 99 mg/dL    Potassium 4.2 3.5 - 5.1 mmol/L    Chloride 105 98 - 111 mmol/L    CO2 - Carbon Dioxide 21.3 21.0 - 31.0 mmol/L    Anion Gap 8.7 3.0 - 11.0 mmol/L    GFR Estimate 13 (L) >=60 mL/min/1.19m*2    GFR Additional Info     Basic Metabolic Panel -Daily    Collection Time: 01/31/16  4:10 AM   Result Value Ref Range    Sodium 133 (L) 135 - 143 mmol/L    Potassium 4.2 3.5 - 5.1 mmol/L    Chloride 103 98 - 111 mmol/L    CO2 - Carbon Dioxide 21.9 21.0 - 31.0 mmol/L    Glucose 97 80 - 99 mg/dL    BUN 31  (H) 6 - 23 mg/dL    Creatinine 2.95 (H) 0.55 - 1.10 mg/dL    Calcium 9.0 8.6 - 62.1 mg/dL    Anion Gap 8.1 3.0 - 11.0 mmol/L    GFR Estimate 14 (L) >=60 mL/min/1.43m*2    GFR Additional Info     CBC with Auto Differential -Daily    Collection Time: 01/31/16  4:10 AM   Result Value Ref Range    WBC 16.8 (H) 4.8 - 10.8 10*3/?L    RBC 3.49 (L) 4.20 - 5.40 10*6/?L    Hemoglobin 7.7 (L) 12.0 - 16.0 g/dL    HCT 30.8 (L) 65.7 - 48.0 %    MCV 71.9 (L) 81.0 - 99.0 fL    MCH 22.1 (L) 27.0 - 34.0 pg    MCHC 30.7 (L) 32.0 - 36.0 g/dL    RDW 84.6 (H) 96.2 - 14.5 %    Platelet Count 32 (L) 150 - 400 10*3/?L    MPV 8.4 7.4 - 10.4 fL    Neutrophils % 88 (H) 35 - 70 %    Lymphocytes % 8 (L) 25 - 45 %    Monocytes % 4 0 - 12 %    Eosinophils % 0 0 - 7 %    Basophils % 0 0 - 2 %    Neutrophils, Absolute 14.8 (H) 1.6 - 7.3 10*3/?L    Lymphocytes, Absolute 1.3 1.1 - 4.3 10*3/?L    Monocytes, Absolute 0.7 0.0 - 1.2 10*3/?L    Eosinophils, Absolute 0.0 0.0 - 0.7 10*3/?L    Basophils, Absolute 0.0 0.0 - 0.2 10*3/?L    Differential Type Manual Differential     Platelet Estimate Decreased (A) Normal    Anisocytosis 3+ (A) (none)    Hypochromasia 1+ (A) (none)    Microcytes 2+ (A) (none)    Ovalocytes 1+ (A) (none)    Polychromasia 1+ (A) (none)    Schistocytes 1+ (A) (none)   Comprehensive Metabolic Panel -Daily    Collection Time: 01/31/16  4:10 AM   Result Value Ref Range    Sodium 133 (L) 135 - 143 mmol/L    Potassium 4.2 3.5 - 5.1 mmol/L    Chloride 103 98 - 111 mmol/L    CO2 - Carbon Dioxide 21.9 21.0 - 31.0 mmol/L    Glucose 97 80 - 99 mg/dL    BUN 31 (H) 6 - 23 mg/dL    Creatinine 9.52 (H) 0.55 - 1.10  mg/dL    Calcium 9.0 8.6 - 72.5 mg/dL    AST - Aspartate Aminotransferase 39 8 - 39 IU/L    ALT - Alanine Amino transferase 4 (L) 7 - 52 IU/L    Alkaline Phosphatase 91 34 - 104 IU/L    Bilirubin Total 2.3 (H) 0.3 - 1.2 mg/dL    Protein Total 4.6 (L) 6.0 - 8.0 g/dL    Albumin 2.4 (L) 3.5 - 5.0 g/dL    Globulin 2.2 2.2 - 3.7 g/dL     Albumin/Globulin Ratio 1.1 >0.9    Anion Gap 8.1 3.0 - 11.0 mmol/L    GFR Estimate 14 (L) >=60 mL/min/1.28m*2    GFR Additional Info     Phosphorus -Daily    Collection Time: 01/31/16  4:10 AM   Result Value Ref Range    Phosphorus 3.2 2.5 - 4.5 mg/dL   Magnesium -Daily    Collection Time: 01/31/16  4:10 AM   Result Value Ref Range    Magnesium 1.8 1.6 - 2.4 mg/dL   Ionized Calcium -Daily    Collection Time: 01/31/16  4:10 AM   Result Value Ref Range    Ionized Calcium 1.31 (H) 1.12 - 1.30 mmol/L   APTT -Daily    Collection Time: 01/31/16  4:10 AM   Result Value Ref Range    APTT 50 (H) 23 - 36 Seconds   Renal Function Panel -Q6H    Collection Time: 01/31/16  4:10 AM   Result Value Ref Range    Albumin 2.4 (L) 3.5 - 5.0 g/dL    Calcium 9.0 8.6 - 36.6 mg/dL    Phosphorus 3.2 2.5 - 4.5 mg/dL    BUN 31 (H) 6 - 23 mg/dL    Creatinine 4.40 (H) 0.55 - 1.10 mg/dL    Sodium 347 (L) 425 - 143 mmol/L    Glucose 97 80 - 99 mg/dL    Potassium 4.2 3.5 - 5.1 mmol/L    Chloride 103 98 - 111 mmol/L    CO2 - Carbon Dioxide 21.9 21.0 - 31.0 mmol/L    Anion Gap 8.1 3.0 - 11.0 mmol/L    GFR Estimate 14 (L) >=60 mL/min/1.35m*2    GFR Additional Info     Blood Gas-Arterial -Daily    Collection Time: 01/31/16  4:27 AM   Result Value Ref Range    ABG PH (ASA) 7.354 7.350 - 7.450    PCO2 Arterial Blood Gas 38.1 35.0 - 45.0 mmHg    pO2 Arterial Blood Gas 123.3 (H) 80.0 - 100.0 mmHg    HCO3 Arterial Blood Gas 20.8 (L) 22.0 - 26.0 mmol/L    Base Excess -4.4 (L) -2.0 - 2.0 mmol/L    tHb 8.8 (L) 12.0 - 16.0 g/dL    Z5GL 87.5 (H) 64.3 - 97.0 %    Carboxyhemoglobin 0.8 0.0 - 1.5 %    Methemoglobin 0.3 0.0 - 1.0 %    O2 Saturation 98.4 92.0 - 98.5 %    Patient Temperature 37.0 C    Allen's Test NA     Sample Type Blood     Specimen Site AL     O2 Delivery Device Ventilator     FIO2 40     Ventilator Mode PRVC     Minute Ventilation 12     Tidal Volume 480.0 mL    Peep 5.0 cmH2O    Drawn By AK     Puncture Attempts 1     Time Analyzed [id number]  Analyzed By Norris Cross      Pending Labs     Order Current Status    Blood Gas-Arterial -See Comments for Frequency In process    Extra Tubes (ASA) -Next Routine In process    Red Top -Once In process    Blood Culture -x 2 Preliminary result    Blood Culture -x 2 Preliminary result    Respiratory culture -Next Routine Preliminary result        Recent Imaging:  No results found.     LOS: 8 days     Elby Showers  01/31/2016  8:28 AM

## 2016-02-01 ENCOUNTER — Inpatient Hospital Stay: Admit: 2016-02-01 | Payer: MEDICAID

## 2016-02-01 LAB — ABG
Base Excess: 0.9 mmol/L (ref ?–2.0)
Carboxyhemoglobin: 1 % (ref 0.0–1.5)
FIO2: 30
HCO3 Arterial: 25.4 mmol/L (ref 22.0–26.0)
Hemoglobin: 8.6 g/dL — ABNORMAL LOW (ref 12.0–16.0)
Mean Airway Pressure: 10
Methemoglobin: 0.3 % (ref 0.0–1.0)
Minute Ventilation: 11
O2Hb: 95.5 % (ref 94.0–97.0)
Peak Inspiratory Pressure: 21 cmH2O
Peep: 5 cmH2O
Puncture Attempts: 1
Respiratory Rate: 20 b/min
Tidal Volume: 480 mL
Time Analyzed: 20170822042501
pCO2 Arterial: 40.1 mmHg (ref 35.0–45.0)
pH Arterial: 7.42 (ref 7.350–7.450)
pO2 Arterial: 89 mmHg (ref 80.0–100.0)
sO2 Arterial: 96.8 % (ref 92.0–98.5)

## 2016-02-01 LAB — CBC WITHOUT DIFFERENTIAL
HCT: 25 % — ABNORMAL LOW (ref 37.0–48.0)
Hemoglobin: 7.7 g/dL — ABNORMAL LOW (ref 12.0–16.0)
MCH: 22.2 pg — ABNORMAL LOW (ref 27.0–34.0)
MCHC: 30.8 g/dL — ABNORMAL LOW (ref 32.0–36.0)
MCV: 72.1 fL — ABNORMAL LOW (ref 81.0–99.0)
MPV: 7.9 fL (ref 7.4–10.4)
Platelet Count: 24 10*3/ÂµL — ABNORMAL LOW (ref 150–400)
RBC: 3.46 10*6/ÂµL — ABNORMAL LOW (ref 4.20–5.40)
RDW: 24.4 % — ABNORMAL HIGH (ref 11.5–14.5)
WBC: 12.6 10*3/ÂµL — ABNORMAL HIGH (ref 4.8–10.8)

## 2016-02-01 LAB — CBC WITH AUTO DIFFERENTIAL
Basophils %: 0 % (ref 0–2)
Basophils, Absolute: 0 10*3/ÂµL (ref 0.0–0.2)
Eosinophils %: 2 % (ref 0–7)
Eosinophils, Absolute: 0.3 10*3/ÂµL (ref 0.0–0.7)
HCT: 24.9 % — ABNORMAL LOW (ref 37.0–48.0)
Hemoglobin: 7.5 g/dL — ABNORMAL LOW (ref 12.0–16.0)
Lymphocytes %: 7 % — ABNORMAL LOW (ref 25–45)
Lymphocytes, Absolute: 0.9 10*3/ÂµL — ABNORMAL LOW (ref 1.1–4.3)
MCH: 22.1 pg — ABNORMAL LOW (ref 27.0–34.0)
MCHC: 30.3 g/dL — ABNORMAL LOW (ref 32.0–36.0)
MCV: 72.8 fL — ABNORMAL LOW (ref 81.0–99.0)
MPV: 6.6 fL — ABNORMAL LOW (ref 7.4–10.4)
Monocytes %: 10 % (ref 0–12)
Monocytes, Absolute: 1.3 10*3/ÂµL — ABNORMAL HIGH (ref 0.0–1.2)
Neutrophils %: 81 % — ABNORMAL HIGH (ref 35–70)
Neutrophils, Absolute: 10.9 10*3/ÂµL — ABNORMAL HIGH (ref 1.6–7.3)
Platelet Count: 25 10*3/ÂµL — ABNORMAL LOW (ref 150–400)
RBC: 3.41 10*6/ÂµL — ABNORMAL LOW (ref 4.20–5.40)
RDW: 24.9 % — ABNORMAL HIGH (ref 11.5–14.5)
WBC: 13.5 10*3/ÂµL — ABNORMAL HIGH (ref 4.8–10.8)

## 2016-02-01 LAB — RENAL FUNCTION PANEL
Albumin: 2.3 g/dL — ABNORMAL LOW (ref 3.5–5.0)
Albumin: 2.3 g/dL — ABNORMAL LOW (ref 3.5–5.0)
Albumin: 2.4 g/dL — ABNORMAL LOW (ref 3.5–5.0)
Anion Gap: 7.4 mmol/L (ref 3.0–11.0)
Anion Gap: 9.5 mmol/L (ref 3.0–11.0)
Anion Gap: 6.7 mmol/L (ref 3.0–11.0)
BUN: 21 mg/dL (ref 6–23)
BUN: 17 mg/dL (ref 6–23)
BUN: 16 mg/dL (ref 6–23)
CO2 - Carbon Dioxide: 24.6 mmol/L (ref 21.0–31.0)
CO2 - Carbon Dioxide: 24.5 mmol/L (ref 21.0–31.0)
CO2 - Carbon Dioxide: 24.3 mmol/L (ref 21.0–31.0)
Calcium: 9.1 mg/dL (ref 8.6–10.3)
Calcium: 8.9 mg/dL (ref 8.6–10.3)
Calcium: 9.2 mg/dL (ref 8.6–10.3)
Chloride: 102 mmol/L (ref 98–111)
Chloride: 101 mmol/L (ref 98–111)
Chloride: 101 mmol/L (ref 98–111)
Creatinine: 2.16 mg/dL — ABNORMAL HIGH (ref 0.55–1.10)
Creatinine: 1.62 mg/dL — ABNORMAL HIGH (ref 0.55–1.10)
Creatinine: 1.47 mg/dL — ABNORMAL HIGH (ref 0.55–1.10)
GFR Estimate: 23 mL/min/{1.73_m2} — ABNORMAL LOW (ref >=60)
GFR Estimate: 32 mL/min/{1.73_m2} — ABNORMAL LOW (ref >=60)
GFR Estimate: 36 mL/min/{1.73_m2} — ABNORMAL LOW (ref >=60)
Glucose: 116 mg/dL — ABNORMAL HIGH (ref 80–99)
Glucose: 121 mg/dL — ABNORMAL HIGH (ref 80–99)
Glucose: 144 mg/dL — ABNORMAL HIGH (ref 80–99)
Phosphorus: 2.3 mg/dL — ABNORMAL LOW (ref 2.5–4.5)
Phosphorus: 2.2 mg/dL — ABNORMAL LOW (ref 2.5–4.5)
Phosphorus: 2.2 mg/dL — ABNORMAL LOW (ref 2.5–4.5)
Potassium: 3.9 mmol/L (ref 3.5–5.1)
Potassium: 3.8 mmol/L (ref 3.5–5.1)
Potassium: 4.1 mmol/L (ref 3.5–5.1)
Sodium: 134 mmol/L — ABNORMAL LOW (ref 135–143)
Sodium: 135 mmol/L (ref 135–143)
Sodium: 132 mmol/L — ABNORMAL LOW (ref 135–143)

## 2016-02-01 LAB — HEPARIN INDUCED THROMBOCYTOPENIA ANTIBODY: Heparin Induced Thrombocytopenia Antibody: POSITIVE — ABNORMAL HIGH

## 2016-02-01 LAB — MAGNESIUM
Magnesium: 1.7 mg/dL (ref 1.6–2.4)
Magnesium: 1.9 mg/dL (ref 1.6–2.4)
Magnesium: 1.8 mg/dL (ref 1.6–2.4)
Magnesium: 1.9 mg/dL (ref 1.6–2.4)

## 2016-02-01 LAB — COMPREHENSIVE METABOLIC PANEL
ALT - Alanine Aminotransferase: 3 IU/L — ABNORMAL LOW (ref 7–52)
AST - Aspartate Aminotransferase: 37 IU/L (ref 8–39)
Albumin/Globulin Ratio: 1 (ref >0.9)
Albumin: 2.3 g/dL — ABNORMAL LOW (ref 3.5–5.0)
Alkaline Phosphatase: 101 IU/L (ref 34–104)
Anion Gap: 7.3 mmol/L (ref 3.0–11.0)
BUN: 18 mg/dL (ref 6–23)
Bilirubin Total: 1.8 mg/dL — ABNORMAL HIGH (ref 0.3–1.2)
CO2 - Carbon Dioxide: 24.7 mmol/L (ref 21.0–31.0)
Calcium: 9 mg/dL (ref 8.6–10.3)
Chloride: 102 mmol/L (ref 98–111)
Creatinine: 1.85 mg/dL — ABNORMAL HIGH (ref 0.55–1.10)
GFR Estimate: 28 mL/min/{1.73_m2} — ABNORMAL LOW (ref >=60)
Globulin: 2.4 g/dL (ref 2.2–3.7)
Glucose: 145 mg/dL — ABNORMAL HIGH (ref 80–99)
Potassium: 3.8 mmol/L (ref 3.5–5.1)
Protein Total: 4.7 g/dL — ABNORMAL LOW (ref 6.0–8.0)
Sodium: 134 mmol/L — ABNORMAL LOW (ref 135–143)

## 2016-02-01 LAB — IONIZED CALCIUM
Ionized Calcium: 1.29 mmol/L (ref 1.12–1.30)
Ionized Calcium: 1.32 mmol/L — ABNORMAL HIGH (ref 1.12–1.30)
Ionized Calcium: 1.29 mmol/L (ref 1.12–1.30)
Ionized Calcium: 1.31 mmol/L — ABNORMAL HIGH (ref 1.12–1.30)

## 2016-02-01 LAB — POCT GLUCOSE
POC Glucose: 118 mg/dL — ABNORMAL HIGH (ref 80–99)
POC Glucose: 142 mg/dL — ABNORMAL HIGH (ref 80–99)
POC Glucose: 130 mg/dL — ABNORMAL HIGH (ref 80–99)
POC Glucose: 111 mg/dL — ABNORMAL HIGH (ref 80–99)
POC Glucose: 120 mg/dL — ABNORMAL HIGH (ref 80–99)
POC Glucose: 140 mg/dL — ABNORMAL HIGH (ref 80–99)

## 2016-02-01 LAB — APTT
APTT: 50 Seconds — ABNORMAL HIGH (ref 23–36)
APTT: 51 Seconds — ABNORMAL HIGH (ref 23–36)
APTT: 56 Seconds — ABNORMAL HIGH (ref 23–36)
APTT: 71 Seconds — ABNORMAL HIGH (ref 23–36)

## 2016-02-01 LAB — PT & PTT
APTT: 56 Seconds — ABNORMAL HIGH (ref 23–36)
INR: 3.8 — ABNORMAL HIGH (ref 0.9–1.2)
Protime: 43.5 Seconds — ABNORMAL HIGH (ref 10.0–13.0)

## 2016-02-01 LAB — PHOSPHORUS: Phosphorus: 2 mg/dL — ABNORMAL LOW (ref 2.5–4.5)

## 2016-02-01 MED ORDER — hydrocortisone sod succ (PF) (Solu-CORTEF) injection 50 mg
100 | Freq: Four times a day (QID) | INTRAMUSCULAR | Status: DC
Start: 2016-02-01 — End: 2016-02-04
  Administered 2016-02-01 – 2016-02-04 (×12): 100 mg via INTRAVENOUS

## 2016-02-01 MED ORDER — amiodarone 360 mg in D5W 200mL (1.8 mg/mL) premix infusion
360 | INTRAVENOUS | Status: AC
Start: 2016-02-01 — End: 2016-02-01
  Administered 2016-02-01: 10:00:00 3602001.8 mg/min via INTRAVENOUS
  Administered 2016-02-01: 05:00:00 360 mg/min via INTRAVENOUS

## 2016-02-01 MED ORDER — amiodarone 360 mg in D5W 200mL (1.8 mg/mL) premix infusion
360 | INTRAVENOUS | Status: AC
Start: 2016-02-01 — End: 2016-02-01
  Administered 2016-02-01: 15:00:00 3602001.8 mg/min via INTRAVENOUS
  Administered 2016-02-01 (×2): 360 mg/min via INTRAVENOUS
  Administered 2016-02-02 (×2): 3602001.8 mg/min via INTRAVENOUS

## 2016-02-01 MED ORDER — amiodarone (CORDARONE) 150 mg in dextrose 5 % (D5W) 100 mL bolus
50 | Freq: Once | INTRAVENOUS | Status: AC
Start: 2016-02-01 — End: 2016-01-31
  Administered 2016-02-01 (×2): via INTRAVENOUS

## 2016-02-01 MED ORDER — argatroban 50 mg/50 mL (1 mg/mL) IVPB
50 | INTRAVENOUS | Status: DC
Start: 2016-02-01 — End: 2016-02-17
  Administered 2016-02-01: 18:00:00 50501 ug/kg/min via INTRAVENOUS
  Administered 2016-02-01: 16:00:00 50 ug/kg/min via INTRAVENOUS
  Administered 2016-02-01 – 2016-02-02 (×2): 50501 ug/kg/min via INTRAVENOUS
  Administered 2016-02-02 (×2): 50 ug/kg/min via INTRAVENOUS
  Administered 2016-02-02 – 2016-02-03 (×2): 50501 ug/kg/min via INTRAVENOUS
  Administered 2016-02-03 (×2): 50 ug/kg/min via INTRAVENOUS
  Administered 2016-02-03 – 2016-02-04 (×2): 50501 ug/kg/min via INTRAVENOUS
  Administered 2016-02-04: 05:00:00 50 ug/kg/min via INTRAVENOUS
  Administered 2016-02-04: 14:00:00 50501 ug/kg/min via INTRAVENOUS
  Administered 2016-02-04 (×2): 50 ug/kg/min via INTRAVENOUS
  Administered 2016-02-04 – 2016-02-05 (×2): 50501 ug/kg/min via INTRAVENOUS
  Administered 2016-02-05 – 2016-02-06 (×4): 50 ug/kg/min via INTRAVENOUS
  Administered 2016-02-06: 12:00:00 50501 ug/kg/min via INTRAVENOUS
  Administered 2016-02-06: 04:00:00 50 ug/kg/min via INTRAVENOUS
  Administered 2016-02-07 (×2): 50501 ug/kg/min via INTRAVENOUS
  Administered 2016-02-07 – 2016-02-08 (×5): 50 ug/kg/min via INTRAVENOUS
  Administered 2016-02-08: 19:00:00 50501 ug/kg/min via INTRAVENOUS
  Administered 2016-02-09 (×2): 50 ug/kg/min via INTRAVENOUS
  Administered 2016-02-09: 14:00:00 50501 ug/kg/min via INTRAVENOUS
  Administered 2016-02-09 – 2016-02-10 (×2): 50 ug/kg/min via INTRAVENOUS
  Administered 2016-02-10: 13:00:00 50501 ug/kg/min via INTRAVENOUS
  Administered 2016-02-10 (×2): 50 ug/kg/min via INTRAVENOUS
  Administered 2016-02-10: 22:00:00 50501 ug/kg/min via INTRAVENOUS
  Administered 2016-02-11: 07:00:00 50 ug/kg/min via INTRAVENOUS
  Administered 2016-02-11: 14:00:00 50501 ug/kg/min via INTRAVENOUS
  Administered 2016-02-11 – 2016-02-12 (×2): 50 ug/kg/min via INTRAVENOUS
  Administered 2016-02-12: 15:00:00 50501 ug/kg/min via INTRAVENOUS
  Administered 2016-02-12 (×2): 50 ug/kg/min via INTRAVENOUS
  Administered 2016-02-12: 13:00:00 50501 ug/kg/min via INTRAVENOUS
  Administered 2016-02-13 – 2016-02-14 (×5): 50 ug/kg/min via INTRAVENOUS
  Administered 2016-02-14: 21:00:00 50501 ug/kg/min via INTRAVENOUS
  Administered 2016-02-14 – 2016-02-15 (×4): 50 ug/kg/min via INTRAVENOUS
  Administered 2016-02-15 – 2016-02-16 (×2): 50501 ug/kg/min via INTRAVENOUS
  Administered 2016-02-16: 04:00:00 50 ug/kg/min via INTRAVENOUS

## 2016-02-01 MED ORDER — amiodarone (PACERONE) tablet 400 mg
200 | Freq: Two times a day (BID) | ORAL | Status: DC
Start: 2016-02-01 — End: 2016-02-04
  Administered 2016-02-04 (×2): 200 mg via ORAL

## 2016-02-01 MED FILL — LEVOFLOXACIN IN D5W 750 MG/150 ML IVPB PREMIX: 750 mg/150 mL | INTRAVENOUS | Qty: 150

## 2016-02-01 MED FILL — MILRINONE 20 MG/100 ML(200 MCG/ML) IN 5 % DEXTROSE INTRAVENOUS PIGGYBK: 200 ug/mL | INTRAVENOUS | Qty: 100

## 2016-02-01 MED FILL — NEXTERONE 360 MG/200 ML (1.8 MG/ML) INTRAVENOUS SOLUTION: 360 mg/200 mL (1.8 mg/mL) | INTRAVENOUS | Qty: 360

## 2016-02-01 MED FILL — PRISMASOL BGK K (2 MEQ/L)-MG(1MEQ/L) HEMODIALYSIS SOLUTION: ARTERIOVENOUS_FISTULA | Qty: 5

## 2016-02-01 MED FILL — DEXMEDETOMIDINE 100 MCG/ML INTRAVENOUS SOLUTION: 100 ug/mL | INTRAVENOUS | Qty: 4

## 2016-02-01 MED FILL — AMIODARONE 50 MG/ML INTRAVENOUS SOLUTION: 50 mg/mL | INTRAVENOUS | Qty: 3

## 2016-02-01 MED FILL — SOLU-CORTEF ACT-O-VIAL (PF) 100 MG/2 ML SOLUTION FOR INJECTION: 100 | INTRAMUSCULAR | Qty: 2

## 2016-02-01 MED FILL — VASOSTRICT 20 UNIT/ML INTRAVENOUS SOLUTION: 20 [IU]/mL | INTRAVENOUS | Qty: 2.5

## 2016-02-01 MED FILL — NOREPINEPHRINE BITARTRATE 4 MG/250 ML (16 MCG/ML) IN DEXTROSE 5 % IV: 4 mg/250 mL (16 mcg/mL) | INTRAVENOUS | Qty: 250

## 2016-02-01 MED FILL — SODIUM PHOSPHATE 3 MMOL/ML INTRAVENOUS SOLUTION: 3 mmol/mL | INTRAVENOUS | Qty: 5

## 2016-02-01 MED FILL — FAMOTIDINE (PF) IN 0.9% NACL 20 MG/50 ML IVPB PREMIX: 20 mg/50 mL | INTRAVENOUS | Qty: 50

## 2016-02-01 MED FILL — ARGATROBAN 50 MG/50 ML (1 MG/ML) IN SODIUM CHLORIDE (ISO-OSMOTIC) IV: 50 mg/50 mL (1 mg/mL) | INTRAVENOUS | Qty: 50

## 2016-02-01 MED FILL — MAGNESIUM SULFATE 1 GRAM/100 ML IN DEXTROSE 5 % INTRAVENOUS PIGGYBACK: 1 gram/100 mL | INTRAVENOUS | Qty: 100

## 2016-02-01 MED FILL — HEPARIN (PORCINE) 1,000 UNIT/ML INJECTION VIAL: 1000 [IU]/mL | INTRAMUSCULAR | Qty: 1

## 2016-02-01 NOTE — Progress Notes (Signed)
Respiratory End of Shift Summary   02/01/2016                    4:57 AM         Sensorium    Intubated and sedated    Breath Sounds   (changes during shift) Clear/diminished, no change post tx    Response to therapy   (or changes in therapy) No changes: Alb Q4   Is cough productive or require suctioning (frequency, characteristics)       Productive cough with suctioning: Small-moderate white/yellow thick secretions from ETT    Ventilator  (start & end of shift settings   and other relevant assessments)             No changes this shift: PRVC 480/20/40% peep 5    ABG: 7.42/40/89/25.4    BiPAP   (settings, changes, tolerance, usage, continuous, sleep only) N/A    Weaning attempts/plans                 N/A    Skin Integrity No breakdown noted     Significant Events  (transport, procedures) N/A   Other Comments ETT secure 24 @ lips     Norris Cross, RT

## 2016-02-01 NOTE — Progress Notes (Signed)
PHARMACIST CRITICAL CARE PROGRESS NOTE     Consult Per P&T Protocol: Critical Care Physician Service, Antimicrobial Interventions, Renal Dosing, IV to PO         Summary: Brandy [name] is [name] admitted on 01/23/2016 with a NSTEMI s/p R/L heart craterization.     Subjective: I visited the patient, but was unable to explain the pharmacist plan of care to the patient due to sedation and/or intubation.    Allergies: Heparin and Sulfa (sulfonamide antibiotics)    PMH:  has a past medical history of History of blood transfusion.     Surgical history:  has a past surgical history that includes Tonsillectomy; Tubal ligation; Tubal ligation (1980); and ocedure (N/A, 01/27/2016).    Active problems:  ? Aortic Valve replacement  ? Acute systolic heart failure  ? NSTEMI  ? Possible HIT (with possible thrombosis?)       OBJECTIVE:     Diet Orders:   Procedures   . Diet, NPO       Height: 167.6 cm (66) Admit weight: 101.4 kg (223 lb 8.7 oz) Last charted weight: 114.8 kg (253 lb 1.4 oz) IBW: 59.3 kg Body mass index is 40.85 kg/(m^2).    I/O last 3 completed shifts:  In: 4079.3 [I.V.:3457.2; NG/GT:365.6; IV Piggyback:256.5]  Out: 5371 [Urine:322; AYTKZ:6010; Chest Tube:640]    Recent Labs      01/31/16   0410   01/31/16   2117  02/01/16   0347  02/01/16   1008   NA  133*  133*  133*   < >  134*  134*  135   K  4.2  4.2  4.2   < >  3.9  3.8  3.8   CL  103  103  103   < >  102  102  101   CO2  21.9  21.9  21.9   < >  24.6  24.7  24.5   GLU  97  97  97   < >  116*  145*  121*   BUN  31*  31*  31*   < >  21  18  17    CREATININES  3.26*  3.26*  3.26*   < >  2.16*  1.85*  1.62*   CALCIUM  9.0  9.0  9.0   < >  9.1  9.0  8.9   AST  39   --    --   37   --    ALT  4*   --    --   3*   --    ALBUMIN  2.4*  2.4*   < >  2.3*  2.3*  2.3*    < > = values in this interval not displayed.     Recent Labs      01/31/16   2117  02/01/16   0347  02/01/16   1008   CAION  1.29  1.32*  1.29     Recent Labs      01/31/16   2117  02/01/16   0347  02/01/16   1008   MG  1.7  1.9  1.8   PHOS  2.3*  2.0*  2.2*     Recent Labs      01/31/16   9323  01/31/16   1632  01/31/16   2015  01/31/16   2353  02/01/16   0345  02/01/16   0858   POCGLU  90  101*  118*  142*  130*  111*     Recent Labs      01/30/16   1635  01/31/16   0410  02/01/16   0347  02/01/16   1008   WHITEBLOODCE  20.3*  16.8*  13.5*  12.6*   NEUTROABS  17.6*  14.8*  10.9*   --    HGB  8.6*  7.7*  7.5*  7.7*   HCT  27.9*  25.1*  24.9*  25.0*   LABPLAT  50*  32*  25*  24*       Temp (24hrs), Avg:36.4 ?C (97.6 ?F), Min:36.1 ?C (97 ?F), Max:37.1 ?C (98.8 ?F)      Relevant Microbiology:  Date: Source: Results:  Status:   8/20 Bronc Wash  Viral/Fungal  Pending   8/20 BAL NGTD Pending   8/19 Blood x 4 NGTD Pending   8/19 ET Asp NGTD Pending     ASSESSMENT:     Estimated CrCl: CRRT (started 8/20 PM)    Critical Care Prophylaxis:  VAP: Peridex 0.12% 15 mL BID   VTE: PLT 24    Heparin products discontinued.  Argatroban infusion initiated.  SCDs   SUP: Pepcid 20 mg IV daily     CBG Monitoring:   ? CBG goal in critically ill: 140-180  ? CBG in past 24H: <180      Antimicrobial Monitoring:   Indication for antimicrobials: Sepsis of unknown origin (per Intensivist 8/19)   Current antimicrobials: Levaquin 750 mg IV Q24H (8/21-current)   Relevant previous antimicrobials: Levaquin 750 mg IV Q48H (8/19)   # of days of therapy directed at source of infection or pathogen: Day 4 on 8/22   Comments about duration? Dependent on source  General sepsis: 7-10 days (Surviving Sepsis)   Notes about antimicrobial therapy or microbiology data?        PLAN:     Critical Care Prophylaxis:  ? Prophylactic medications ordered    ? Post op thrombocytopenia PLT 24, Heparin products discontinued.  Argatroban infusion started  ? Monitor for s/sx of bleeding    Antimicrobial Interventions:   ? Levaquin started empirically for sepsis  ? Will follow    Renal Dosing:   ? CRRT started 8/20 in PM  ? Change Levaquin to 750  mg IV Q24H      IV to PO:   ? No switches at the moment     Argatroban infusion:  ? Using the Argatroban protocol, I have discontinued Heparin products and enter heparin as an allergy.  ? This is a critically ill patient s/p surgery, aortic valve replacement, NSTEMI, On CRRT  ? I have been told that MRs. Egley has a trombosis in a lower extremity.  ? I have started Argatroban at 0.44mcg/Kg/min (3.61ml/hr)  ? PTT to be drawn 2 hours after infusion started.  RN has been made aware.  ? Were warfarin to be considered- it may NOT be added to therapy until platelets return to 150K or greater.    Iona Beard, RPh., PharmD.

## 2016-02-01 NOTE — Progress Notes (Addendum)
Critical Care Progress Note:    Subjective:    The patient is a 21 year non smoking female with BMI 40 and progressive dyspnea since June.   ?  8/11 Admitted to [facility]  with respiratory distress, pulmonary edema, NSTEMIC, anemia requiring 2 units PRBCs. ECHO showed severe aortic stenosis, EF 30%. Heart cath demonstrated no significant coronary occlusion.    ?  8/13 Transferred to [facility].   ?  8/17 Dr. Debroah Baller performed aortic valve replacement and right femoral IABP placement. Intraoperative EF was noted to be 15%. The patient was brought to the CCU post operatively. She required multiple vasopressors and inotropes and also received methylene blue. Nebulized flolan for pHTN.   ?  8/18 IABP was removed.   ?  8/19 Swan clotted and was removed. Levaquin started. Flolan stopped.   ?  8/20 Temp HD catheter was placed on and CRRT initiated. Bronchoscopy performed due to fever. Foley removed, new foley placed.   ?  8/21 Still intubated. Milrinone 0.375 mcg/kg/min, levophed at 10 mcg/min. CRRT. Due to ischemic appearing digits right radial aline removed, left radial aline placed. No purposeful response during sedation holiday. Enteral feeds started.     The patient remains intubated on levophed, milrinone, CRRT running. Argatroban is being started.Sedation is being held. She received IV amiodarone for afib.     Medications: Reviewed    Objective:    Physical Exam:    Vitals:    02/01/16 0745   BP:    Pulse: (!) 123   Resp: (!) 25   Temp:    SpO2: 100%         Intake/Output Summary (Last 24 hours) at 02/01/16 0917  Last data filed at 02/01/16 0859   Gross per 24 hour   Intake           2629.2 ml   Output             3672 ml   Net          -1042.8 ml       Weight: 114.8 kg (253 lb 1.4 oz)    Gen:  59 y.o.yr old  female lying in bed in no acute distress.  HEENT: Atraumatic  Neck: right IJ cordis  Chest: sternotomy  Cardiac: s1 s2  Abdm: Soft, non-tender  Ext: Dusky digits right hand, left foot  Neuro: Over breathes  ventilator, no purposeful response to verbal stimuli    Continuous Infusions:  . amiodarone in D5W 0.5 mg/min (02/01/16 0739)   . argatroban 0.5 mcg/kg/min (02/01/16 0907)   . clevidipine     . dexmedetomidine Stopped (02/01/16 0739)   . dextrose 10% (D10W)     . dextrose 5% lactated ringers Stopped (01/29/16 1829)   . DOBUTamine (DOBUTREX) 250 mg in D5W 250 mL (1000 mcg/mL) infusion     . fentanyl Stopped (02/01/16 0739)   . insulin (HUMAN R) 150 Units in NaCl 0.9 % 150 mL (1 unit/mL) infusion - cardiac surgery Stopped (01/29/16 1841)   . midazolam Stopped (01/31/16 0900)   . milrinone 0.375 mcg/kg/min (02/01/16 0740)   . nitroglycerin     . norepinephrine 9 mcg/min (02/01/16 0740)   . phenylephrine (NEO-SYNEPHRINE) 40 mg in NaCl 0.9 % 500 mL (80 mcg/mL) infusion **DOUBLE CONCENTRATION** Stopped (01/31/16 0515)   . prismasate with additives 1,500 mL/hr at 02/01/16 0908   . prismasate with additives 500 mL/hr at 02/01/16 0522   . prismasate with additives 500 mL/hr at 02/01/16 0520   .  vasopressin (PITRESSIN) 50 units in NaCl 0.9 % 250 mL (0.2 units/mL) infusion **ADULT SHOCK** Stopped (01/28/16 1711)       Meds:  . albuterol  2.5 mg Nebulization Q4H   . amiodarone  400 mg Oral Q12H   . aspirin  300 mg Rectal Daily   . chlorhexidine  15 mL Mouth/Throat Q12H   . famotidine  20 mg Intravenous Daily   . levofloxacin (LEVAQUIN) IV  750 mg Intravenous Q24H   . phenylephrine       . phenylephrine       . sodium chloride 0.9 % (NS) syringe  10 mL Intravenous Q8H SCH   . sterile water (bottle)  30 mL Irrigation Q4H       Ventilator:   Vent Mode: PRVC/AC  FiO2 (%):  [30-40] 30  S RR:  [20] 20  S VT:  [480 mL] 480 mL  PEEP/CPAP (cm H2O):  [5 cm H20] 5 cm H20  MAP (cm H2O):  [6-11] 11  O2 Device: Ventilator    LABS:  Recent Labs      01/30/16   1635  01/31/16   0410  02/01/16   0347   HGB  8.6*  7.7*  7.5*     Recent Labs      01/31/16   1629  01/31/16   2117  02/01/16   0347   NA  134*  134*  134*   K  4.1  3.9  3.8   CL  102   102  102   CO2  23.9  24.6  24.7   BUN  23  21  18    GLU  106*  116*  145*     Recent Labs      01/31/16   0410  02/01/16   0347   AST  39  37   ALT  4*  3*   ALKPHOS  91  101     No results for input(s): INR, PTT in the last 72 hours.  Recent Labs      01/30/16   2117  01/31/16   0427  02/01/16   0421   PO2  98.8  123.3*  89.0   PEEP  5.0  5.0  5.0       GLUCOSE:  Recent Labs      01/31/16   1632  01/31/16   2015  01/31/16   2353  02/01/16   0345  02/01/16   0858   POCGLU  101*  118*  142*  130*  111*       Imaging:   X-ray Chest Pa Or Ap    Result Date: 02/01/2016  CHEST 1 VIEW 02/01/2016 5:26 AM PROVIDED CLINICAL INDICATIONS:  Assess tube and line placement - post-op cardiac surgery    ADDITIONAL CLINICAL HISTORY:  None. COMPARISON:  Yesterday TECHNIQUE:  Single view of the chest on 1 image FINDINGS:  Endotracheal tube remains in place with tip positioned 3 cm above the carina. Enteric tube projects below the diaphragm, tip not included in the study. RIGHT internal jugular sheath and central venous catheter in unchanged position. LEFT internal jugular dual lumen hemodialysis catheter tip in the mid superior vena cava. Bilateral pleural and mediastinal drains are in unchanged position. Sternotomy wires are aligned and intact. Decrease in central vascular congestion. Improving aeration of both lungs with decreasing bibasilar atelectasis. No pneumothorax is seen. No definite pulmonary edema. Cardiomegaly is unchanged. No pneumoperitoneum.     IMPRESSION:  Decrease in central vascular congestion and bibasilar atelectasis. Unchanged cardiomegaly. Stable support equipment.       Assessment    Active Hospital Problems    Diagnosis SNOMED CT(R) Date Noted   . S/P AVR (aortic valve replacement) HISTORY OF AORTIC VALVE REPLACEMENT 01/27/2016   . Respiratory failure (CMS/HCC) RESPIRATORY FAILURE 01/23/2016   . Aortic stenosis, severe AORTIC VALVE STENOSIS 01/22/2016   . NSTEMI (non-ST elevated myocardial infarction)  (CMS/HCC) ACUTE NON-ST SEGMENT ELEVATION MYOCARDIAL INFARCTION 01/21/2016   . Demand ischemia of myocardium (CMS/HCC) ACUTE ISCHEMIC HEART DISEASE 01/21/2016   . Iron deficiency anemia IRON DEFICIENCY ANEMIA 01/21/2016   . Obesity OBESITY 01/21/2016     ?  IMPRESSION  ?  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  Plan:    Neurologic: Fentanyl as needed for pain. Precedex as needed for sedation. Sedation holiday for neuro assessment.     Pulmonary: VAP preventive measures. No spontaneous breathing trial given encephalopathy and circulatory shock.     Cardiac: Circulatory shock persists.     ID: Day #4 levaquin. Cultures remain negative.     Renal: CRRT continues.     GI: Enteral feeds started yesterday. Famotidine.     Endocrine: Insulin as needed to maintain euglycemia. Start IV hydrocortisone given refractory circulatory shock.     Heme: HIT panel ordered. Argatroban started.     Prophylaxis: HOB elevation, Peridex Mouthwash, argatroban, famotidine    Code Status: full    This patient remains at very high risk of organ failure, deterioration, and/or death and continues to require critical care management. I reviewed the case with the critical care nursing staff.     Critical Care Time, excluding billable procedures:47 mins    Casimiro Needle Mery Guadalupe,MD

## 2016-02-01 NOTE — Progress Notes (Addendum)
CARDIOTHORACIC SURGERY CRITICAL CARE NOTE     Name: Brandy [name]  DOB: 1955-07-26 60 y.o.  MRN: [id number]  CSN: [id number]    Brandy [name] is a 61 y.o. female patient. Patient was admitted with Respiratory failure (CMS/HCC)  Who had Procedure(s):  AORTIC VALVE REPLACEMENT; INTRA AORTIC BALLOON INSERTION.  5 Days Post-Op    HPI: The patient is a 60 y.o. female with the below medical co-morbid conditions.    ?  1. Severe symptomatic aortic stenosis                        Ejection fraction 30%     Found on TEE intra-op to be 15%                        Normal coronaries                        Predominant symptom dyspnea                     2. Severe Pulmonary Hypertension  3. Mild to Moderate Mitral regurgitation  4. Morbid obesity                        Weight loss 50 pounds in the last one year  5. respiratory insufficiency  6. Non-ST elevation MI                        The demand ischemia  7. Iron deficiency anemia                        CT chest abdomen and pelvis reviewed no abdominal masses                        Likely AVM secondary to aortic stenosis not a good candidate for sedation and EGD or colonoscopy well aortic stenosis is so critical  ?  Patient's a 60 year old female who presented the [facility] falls in respiratory distress unable to breathe.  There are her troponin was elevated she underwent oxygen therapy and was given Lasix.  Cardiac workup showed normal coronary arteries with depressed LV function on echo and severe aortic stenosis valve area less than 0.4 velocity 4.4 m/s with an EF 30%.  She was transfused 2 units of PRBCs A [facility] hospital with a hemoglobin 8.8.  His immobility volume overload from heart failure  ?  Patient hasn't been to the doctor in years she hasn't taken any medications nor does she take good care of herself.  [place] she is unable to consistently take meds.  She likes to use and asked to [employer] wood to heat her house.    ?  After a few  days of Lasix patient states that she was breathing much better she is 60% better per her suggestion.  I had a long talk with the family about the disease course why she was getting short of breath and discussion of heart failure.  They understand that she is given a need aortic valve replacement she wishes to have tissue valve I do think she is a better surgical candidate and have her given her age and risk.  ?  She has very poor dentition    POD #1  Patient has  been stable on balloon 1:2   Methylene blue last night SVR much better   Coronary perfusion good   Today I am managing acute heart failure with management of the IABP.    POD #2  removal of the intra-aortic balloon pump renal ultrasound completed showing minimal flow to the kidneys  patient still making urine  POD #3 CRRT started  POD #4 CRRT running acid base more stable pressures much better off NEO and LEVOPHED improved making urine  POD#5 CRRT continues   Patient plt 25   Will check for DVT     Subjective: making progress    Assessment & Plan:     Active Hospital Problems    Diagnosis SNOMED CT(R) Date Noted   . S/P AVR (aortic valve replacement) HISTORY OF AORTIC VALVE REPLACEMENT 01/27/2016   . Respiratory failure (CMS/HCC) RESPIRATORY FAILURE 01/23/2016   . Aortic stenosis, severe AORTIC VALVE STENOSIS 01/22/2016   . NSTEMI (non-ST elevated myocardial infarction) (CMS/HCC) ACUTE NON-ST SEGMENT ELEVATION MYOCARDIAL INFARCTION 01/21/2016   . Demand ischemia of myocardium (CMS/HCC) ACUTE ISCHEMIC HEART DISEASE 01/21/2016   . Iron deficiency anemia IRON DEFICIENCY ANEMIA 01/21/2016   . Obesity OBESITY 01/21/2016     Neurologic:  Followed commands after surgery sedated now     Cardiac: Pre-op EF:   EF 15% prior to surgery   Post op 30% with good valve functdion 23mm valve     1. Inotropes Milrinone to 0.15 today      2. Pressors: Levophed at 8         3. Rhythm: AFIB    4. Aspirin: 300 rectal   5. Beta-Blocker: hold   6. Statin: hold   7. ACE/ARB: hold            Respiratory:    Smoker none   Pulmonary toilet    Xray: improved significantly     Gastrointestinal:    Genitourinary: creat: pre op 0.9   Now 1.16 >2.6 up to 3.4>3.26>1.85  ON CRRT     Infectious Disease:  WBC: 17>18> 19.4>16.8>13.5    Hematology: HGB 9.8>8.6>7.7>7.5   PLTS 180> 91>64>32>25  Checked hitt  ++++   Stopped all heparin and started agatroban  Endocrine: Glucose management   Electrolytes: replacing Mg/K/Ca  Prophylaxis: Heparin PPI/H2 Blocker     Scheduled Medications:   . albuterol  2.5 mg Nebulization Q4H   . amiodarone  400 mg Oral Q12H   . aspirin  300 mg Rectal Daily   . chlorhexidine  15 mL Mouth/Throat Q12H   . famotidine  20 mg Intravenous Daily   . levofloxacin (LEVAQUIN) IV  750 mg Intravenous Q24H   . phenylephrine       . phenylephrine       . sodium chloride 0.9 % (NS) syringe  10 mL Intravenous Q8H SCH   . sterile water (bottle)  30 mL Irrigation Q4H     Infusions:   . amiodarone in D5W 0.5 mg/min (02/01/16 0303)   . clevidipine     . dexmedetomidine 0.3 mcg/kg/hr (02/01/16 0012)   . dextrose 10% (D10W)     . dextrose 5% lactated ringers Stopped (01/29/16 1829)   . DOBUTamine (DOBUTREX) 250 mg in D5W 250 mL (1000 mcg/mL) infusion     . fentanyl 25 mcg/hr (02/01/16 0224)   . heparin (porcine) 10 Units/kg/hr (02/01/16 0224)   . insulin (HUMAN R) 150 Units in NaCl 0.9 % 150 mL (1 unit/mL) infusion - cardiac surgery Stopped (01/29/16 1841)   .  midazolam Stopped (01/31/16 0900)   . milrinone 0.375 mcg/kg/min (02/01/16 0027)   . nitroglycerin     . norepinephrine 8 mcg/min (02/01/16 0556)   . phenylephrine (NEO-SYNEPHRINE) 40 mg in NaCl 0.9 % 500 mL (80 mcg/mL) infusion **DOUBLE CONCENTRATION** Stopped (01/31/16 0515)   . prismasate with additives 1,500 mL/hr at 02/01/16 0521   . prismasate with additives 500 mL/hr at 02/01/16 0522   . prismasate with additives 500 mL/hr at 02/01/16 0520   . vasopressin (PITRESSIN) 50 units in NaCl 0.9 % 250 mL (0.2 units/mL) infusion **ADULT SHOCK**  Stopped (01/28/16 1711)     Recent Labs:  Lab Results   Component Value Date    NA 134 (L) 02/01/2016    K 3.8 02/01/2016    K 3.69 01/23/2016    CL 102 02/01/2016    CL 104.1 01/23/2016    CO2 24.7 02/01/2016    CO2 20 (L) 01/23/2016    GLU 145 (H) 02/01/2016    GLU 85 01/23/2016    BUN 18 02/01/2016    CREATININES 1.85 (H) 02/01/2016    CALCIUM 9.0 02/01/2016    AST 37 02/01/2016    ALT 3 (L) 02/01/2016    ALKPHOS 101 02/01/2016    BILITOT 0.90 01/21/2016    ALBUMIN 2.3 (L) 02/01/2016    GLOB 2.4 02/01/2016    AGRATIO 1.0 02/01/2016    ANIONGAP 7.3 02/01/2016    ANIONGAP 21.0 (H) 01/23/2016    LABGLOM 28 (L) 02/01/2016    LABGLOM >60.0 01/23/2016    WHITEBLOODCE 13.5 (H) 02/01/2016    LABPLAT 25 (L) 02/01/2016    HGB 7.5 (L) 02/01/2016    PROTIME 18.7 (H) 01/26/2016    INR 1.7 (H) 01/26/2016    APTT 51 (H) 02/01/2016          Vital Signs:      ? Cardiac output CCO: 5.1 L/Min    ? Cardiac Index CCI: 2.3 L/min  Temp: 36.1 ?C (97 ?F) BP: (!) 124/46 Pulse: (!) 132 Resp: 23 SpO2: 100 % on   Ventilator   Intake/Output:     CT OUTPUT: 550    URINE OUTPUT: 115    Exam:  Neuro:no TIA or stroke-like symptoms  Cardiac:regular rate and rhythm, S1, S2 normal, no murmur, click, rub or gallop  Lungs;clear  ABDOMEN:abdomen is soft without significant tenderness, masses, organomegaly or guarding  EXTREMITIES:less then 2 second capillary refill    PLAN:   Has moved all extremities still sedated  Will back off a little on the milrinone which could be increasing her rates in atrial fibrillation  Xray much better  Volume status improving       Critical Care Time: 40 minutes outside any procedures and family discussion.  Patient remains critical and at high risk for organ failure and death.      Otho Bellows

## 2016-02-01 NOTE — Progress Notes (Signed)
Respiratory End of Shift Summary   02/01/2016                    5:49 PM         Sensorium    Sedated on vent    Breath Sounds   (changes during shift) diminished    Response to therapy   (or changes in therapy) No significant change    Is cough productive or require suctioning (frequency, characteristics)       Small to scant clear    Ventilator  (start & end of shift settings   and other relevant assessments)             No changes to vent today    BiPAP   (settings, changes, tolerance, usage, continuous, sleep only) NA    Weaning attempts/plans                 Does not qualify for SBT yet, hemod unstable    Skin Integrity Had some red cracks in lower left side lip this am.  Appears less red this afternoon.  RN applied protective barrier.      Significant Events  (transport, procedures) Still on CRRT. Cont vent, nebs, SBT when able, am abg's   Other Comments      Myson Levi, RRT

## 2016-02-01 NOTE — Progress Notes (Signed)
Daily Progress Note     Name: Brandy [name]  DOB: [birth year] 60 y.o.  MRN: [id number]  CSN: [id number]    Assessment & Plan:     Active Hospital Problems    Diagnosis SNOMED CT(R) Date Noted   . S/P AVR (aortic valve replacement) HISTORY OF AORTIC VALVE REPLACEMENT 01/27/2016   . Respiratory failure (CMS/HCC) RESPIRATORY FAILURE 01/23/2016   . Aortic stenosis, severe AORTIC VALVE STENOSIS 01/22/2016   . NSTEMI (non-ST elevated myocardial infarction) (CMS/HCC) ACUTE NON-ST SEGMENT ELEVATION MYOCARDIAL INFARCTION 01/21/2016   . Demand ischemia of myocardium (CMS/HCC) ACUTE ISCHEMIC HEART DISEASE 01/21/2016   . Iron deficiency anemia IRON DEFICIENCY ANEMIA 01/21/2016   . Obesity OBESITY 01/21/2016     --AKI: ATN secondary to cardiogenic shock and possible sepsis from nosocomial pneumonia.  Nonoliguric, but unable to diurese with IV lasix monotherapy. No firm indications for initiation of CRRT this morning, but patient is at high risk to develop indication for renal replacement therapy.  CXR improved.    --Pneumonia: On Levaquin, now afebrile.  --S/P AVR:  Complicated by cardiogenic shock.  Hemodynamics improving.  Weaning pressor support.  --A-fib with RVR: Started on amiodarone.   --Thromobcytopenia    Plan:   Continue CRRT with Heparin.    Decrease UF goal of 0.    Subjective:     Brandy [name] is a 60 y.o. female patient. Patient was admitted with critical AS s/p AVR complicated by cardiogenic shock.  Remains intubated on FiO2 at 40%.  On vasopressor support.  CRRT with Heparin running well. In A-fib with RVR overnight.  Started on amiodarone.  Still requiring pressor support. On milrinone. I/0: negative 1.3 L.    Scheduled Medications:   . albuterol  2.5 mg Nebulization Q4H   . amiodarone  400 mg Oral Q12H   . aspirin  300 mg Rectal Daily   . chlorhexidine  15 mL Mouth/Throat Q12H   . famotidine  20 mg Intravenous Daily   . levofloxacin (LEVAQUIN) IV  750 mg Intravenous Q24H   . phenylephrine       .  phenylephrine       . sodium chloride 0.9 % (NS) syringe  10 mL Intravenous Q8H SCH   . sterile water (bottle)  30 mL Irrigation Q4H     Infusions:   . amiodarone in D5W 0.5 mg/min (02/01/16 0739)   . clevidipine     . dexmedetomidine Stopped (02/01/16 0739)   . dextrose 10% (D10W)     . dextrose 5% lactated ringers Stopped (01/29/16 1829)   . DOBUTamine (DOBUTREX) 250 mg in D5W 250 mL (1000 mcg/mL) infusion     . fentanyl Stopped (02/01/16 0739)   . heparin (porcine) 10 Units/kg/hr (02/01/16 0731)   . insulin (HUMAN R) 150 Units in NaCl 0.9 % 150 mL (1 unit/mL) infusion - cardiac surgery Stopped (01/29/16 1841)   . midazolam Stopped (01/31/16 0900)   . milrinone 0.375 mcg/kg/min (02/01/16 0740)   . nitroglycerin     . norepinephrine 9 mcg/min (02/01/16 0740)   . phenylephrine (NEO-SYNEPHRINE) 40 mg in NaCl 0.9 % 500 mL (80 mcg/mL) infusion **DOUBLE CONCENTRATION** Stopped (01/31/16 0515)   . prismasate with additives 1,500 mL/hr at 02/01/16 0521   . prismasate with additives 500 mL/hr at 02/01/16 0522   . prismasate with additives 500 mL/hr at 02/01/16 0520   . vasopressin (PITRESSIN) 50 units in NaCl 0.9 % 250 mL (0.2 units/mL) infusion **ADULT SHOCK** Stopped (01/28/16 1711)  PRN Medications: acetaminophen (TYLENOL) suppository, acetaminophen, calcium chloride IVPB, calcium gluconate IVPB, clevidipine, dextrose 5% lactated ringers, dextrose, DOBUTamine (DOBUTREX) 250 mg in D5W 250 mL (1000 mcg/mL) infusion, EPINEPHrine, fentanyl, heparin (porcine), hydrALAZINE, HYDROcodone-acetaminophen, insulin (HUMAN R) 150 Units in NaCl 0.9 % 150 mL (1 unit/mL) infusion - cardiac surgery, magnesium sulfate IVPB, magnesium sulfate IVPB, magnesium sulfate IVPB, meperidine (PF), midazolam, milrinone, morphine, nitroglycerin, norepinephrine, oxyCODONE, phenylephrine (NEO-SYNEPHRINE) 40 mg in NaCl 0.9 % 500 mL (80 mcg/mL) infusion **DOUBLE CONCENTRATION**, potassium chloride in NS infusion 20 mEq/250 mL, protamine, sodium  chloride, sodium chloride 0.9 % (NS) syringe, sodium chloride 0.9 % (NS) syringe, sodium phosphate IVPB, sterile water (bottle)    Objective:     Vital Signs:    Vital Sign Ranges for Last 24 Hours:  BP  Min: 107/52  Max: 124/46  Temp  Min: 36.1 ?C (97 ?F)  Max: 37.1 ?C (98.8 ?F)  Pulse  Min: 96  Max: 163  Resp  Min: 18  Max: 37  SpO2  Min: 97 %  Max: 100 %    Most Recent Vitals  BP: 124/46  Pulse: 123  Resp: 25  Temp: 36.3 ?C (97.3 ?F)  SpO2: 100 % (08/21 1245-08/22 0745)    Intake/Ouput:    Intake/Output Summary (Last 24 hours) at 02/01/16 0749  Last data filed at 02/01/16 0659   Gross per 24 hour   Intake           2523.3 ml   Output             3836 ml   Net          -1312.7 ml        Exam:  NAD, intubated  Coarse UA BS, CT in place  Irreg, tachy  Abd: Obese, nontender  Ext: No edema  Skin: No rash.    Recent Labs:  Results for orders placed or performed during the hospital encounter of 01/23/16 (from the past 24 hour(s))   POCT Glucose -Next Routine    Collection Time: 01/31/16  9:59 AM   Result Value Ref Range    POC Glucose 90 80 - 99 mg/dL   Ionized Calcium -Z6X    Collection Time: 01/31/16 10:02 AM   Result Value Ref Range    Ionized Calcium 1.31 (H) 1.12 - 1.30 mmol/L   Renal Function Panel -Q6H    Collection Time: 01/31/16 10:03 AM   Result Value Ref Range    Albumin 2.4 (L) 3.5 - 5.0 g/dL    Calcium 9.0 8.6 - 09.6 mg/dL    Phosphorus 2.9 2.5 - 4.5 mg/dL    BUN 27 (H) 6 - 23 mg/dL    Creatinine 0.45 (H) 0.55 - 1.10 mg/dL    Sodium 409 (L) 811 - 143 mmol/L    Glucose 106 (H) 80 - 99 mg/dL    Potassium 4.1 3.5 - 5.1 mmol/L    Chloride 102 98 - 111 mmol/L    CO2 - Carbon Dioxide 23.0 21.0 - 31.0 mmol/L    Anion Gap 8.0 3.0 - 11.0 mmol/L    GFR Estimate 17 (L) >=60 mL/min/1.36m*2    GFR Additional Info     APTT -Q6H    Collection Time: 01/31/16 10:03 AM   Result Value Ref Range    APTT 49 (H) 23 - 36 Seconds   Magnesium -Q6H    Collection Time: 01/31/16  4:29 PM   Result Value Ref Range    Magnesium 1.7 1.6 -  2.4 mg/dL   Renal Function Panel -Q6H    Collection Time: 01/31/16  4:29 PM   Result Value Ref Range    Albumin 2.4 (L) 3.5 - 5.0 g/dL    Calcium 8.9 8.6 - 16.1 mg/dL    Phosphorus 2.4 (L) 2.5 - 4.5 mg/dL    BUN 23 6 - 23 mg/dL    Creatinine 0.96 (H) 0.55 - 1.10 mg/dL    Sodium 045 (L) 409 - 143 mmol/L    Glucose 106 (H) 80 - 99 mg/dL    Potassium 4.1 3.5 - 5.1 mmol/L    Chloride 102 98 - 111 mmol/L    CO2 - Carbon Dioxide 23.9 21.0 - 31.0 mmol/L    Anion Gap 8.1 3.0 - 11.0 mmol/L    GFR Estimate 20 (L) >=60 mL/min/1.36m*2    GFR Additional Info     Ionized Calcium -Q6H    Collection Time: 01/31/16  4:30 PM   Result Value Ref Range    Ionized Calcium 1.29 1.12 - 1.30 mmol/L   APTT -Q6H    Collection Time: 01/31/16  4:30 PM   Result Value Ref Range    APTT 48 (H) 23 - 36 Seconds   POCT Glucose -Next Routine    Collection Time: 01/31/16  4:32 PM   Result Value Ref Range    POC Glucose 101 (H) 80 - 99 mg/dL   POCT Glucose -Next Routine    Collection Time: 01/31/16  8:15 PM   Result Value Ref Range    POC Glucose 118 (H) 80 - 99 mg/dL   Ionized Calcium -W1X    Collection Time: 01/31/16  9:17 PM   Result Value Ref Range    Ionized Calcium 1.29 1.12 - 1.30 mmol/L   Magnesium -Q6H    Collection Time: 01/31/16  9:17 PM   Result Value Ref Range    Magnesium 1.7 1.6 - 2.4 mg/dL   Renal Function Panel -Q6H    Collection Time: 01/31/16  9:17 PM   Result Value Ref Range    Albumin 2.3 (L) 3.5 - 5.0 g/dL    Calcium 9.1 8.6 - 91.4 mg/dL    Phosphorus 2.3 (L) 2.5 - 4.5 mg/dL    BUN 21 6 - 23 mg/dL    Creatinine 7.82 (H) 0.55 - 1.10 mg/dL    Sodium 956 (L) 213 - 143 mmol/L    Glucose 116 (H) 80 - 99 mg/dL    Potassium 3.9 3.5 - 5.1 mmol/L    Chloride 102 98 - 111 mmol/L    CO2 - Carbon Dioxide 24.6 21.0 - 31.0 mmol/L    Anion Gap 7.4 3.0 - 11.0 mmol/L    GFR Estimate 23 (L) >=60 mL/min/1.58m*2    GFR Additional Info     APTT -Q6H    Collection Time: 01/31/16  9:17 PM   Result Value Ref Range    APTT 50 (H) 23 - 36 Seconds   POCT  Glucose -Next Routine    Collection Time: 01/31/16 11:53 PM   Result Value Ref Range    POC Glucose 142 (H) 80 - 99 mg/dL   POCT Glucose -Next Routine    Collection Time: 02/01/16  3:45 AM   Result Value Ref Range    POC Glucose 130 (H) 80 - 99 mg/dL   Magnesium -Y8M    Collection Time: 02/01/16  3:47 AM   Result Value Ref Range    Magnesium 1.9 1.6 - 2.4 mg/dL   Ionized Calcium -  Q6H    Collection Time: 02/01/16  3:47 AM   Result Value Ref Range    Ionized Calcium 1.32 (H) 1.12 - 1.30 mmol/L   CBC with Auto Differential -Daily    Collection Time: 02/01/16  3:47 AM   Result Value Ref Range    WBC 13.5 (H) 4.8 - 10.8 10*3/?L    RBC 3.41 (L) 4.20 - 5.40 10*6/?L    Hemoglobin 7.5 (L) 12.0 - 16.0 g/dL    HCT 60.4 (L) 54.0 - 48.0 %    MCV 72.8 (L) 81.0 - 99.0 fL    MCH 22.1 (L) 27.0 - 34.0 pg    MCHC 30.3 (L) 32.0 - 36.0 g/dL    RDW 98.1 (H) 19.1 - 14.5 %    Platelet Count 25 (L) 150 - 400 10*3/?L    MPV 6.6 (L) 7.4 - 10.4 fL    Neutrophils % 81 (H) 35 - 70 %    Lymphocytes % 7 (L) 25 - 45 %    Monocytes % 10 0 - 12 %    Eosinophils % 2 0 - 7 %    Basophils % 0 0 - 2 %    Neutrophils, Absolute 10.9 (H) 1.6 - 7.3 10*3/?L    Lymphocytes, Absolute 0.9 (L) 1.1 - 4.3 10*3/?L    Monocytes, Absolute 1.3 (H) 0.0 - 1.2 10*3/?L    Eosinophils, Absolute 0.3 0.0 - 0.7 10*3/?L    Basophils, Absolute 0.0 0.0 - 0.2 10*3/?L    Differential Type Automated Differential     Anisocytosis 2+ (A) (none)    Hypochromasia 2+ (A) (none)    Microcytes 2+ (A) (none)    Ovalocytes 1+ (A) (none)    Poikilocytosis 1+ (A) (none)    Polychromasia 1+ (A) (none)    Tear Drop Cells 1+ (A) (none)   Comprehensive Metabolic Panel -Daily    Collection Time: 02/01/16  3:47 AM   Result Value Ref Range    Sodium 134 (L) 135 - 143 mmol/L    Potassium 3.8 3.5 - 5.1 mmol/L    Chloride 102 98 - 111 mmol/L    CO2 - Carbon Dioxide 24.7 21.0 - 31.0 mmol/L    Glucose 145 (H) 80 - 99 mg/dL    BUN 18 6 - 23 mg/dL    Creatinine 4.78 (H) 0.55 - 1.10 mg/dL    Calcium 9.0 8.6  - 29.5 mg/dL    AST - Aspartate Aminotransferase 37 8 - 39 IU/L    ALT - Alanine Amino transferase 3 (L) 7 - 52 IU/L    Alkaline Phosphatase 101 34 - 104 IU/L    Bilirubin Total 1.8 (H) 0.3 - 1.2 mg/dL    Protein Total 4.7 (L) 6.0 - 8.0 g/dL    Albumin 2.3 (L) 3.5 - 5.0 g/dL    Globulin 2.4 2.2 - 3.7 g/dL    Albumin/Globulin Ratio 1.0 >0.9    Anion Gap 7.3 3.0 - 11.0 mmol/L    GFR Estimate 28 (L) >=60 mL/min/1.71m*2    GFR Additional Info     Phosphorus -Daily    Collection Time: 02/01/16  3:47 AM   Result Value Ref Range    Phosphorus 2.0 (L) 2.5 - 4.5 mg/dL   APTT -A2Z    Collection Time: 02/01/16  3:47 AM   Result Value Ref Range    APTT 51 (H) 23 - 36 Seconds   Blood Gas-Arterial -Daily    Collection Time: 02/01/16  4:21 AM   Result  Value Ref Range    ABG PH (ASA) 7.420 7.350 - 7.450    PCO2 Arterial Blood Gas 40.1 35.0 - 45.0 mmHg    pO2 Arterial Blood Gas 89.0 80.0 - 100.0 mmHg    HCO3 Arterial Blood Gas 25.4 22.0 - 26.0 mmol/L    Base Excess 0.9 -2.0 - 2.0 mmol/L    tHb 8.6 (L) 12.0 - 16.0 g/dL    Z6XW 96.0 45.4 - 09.8 %    Carboxyhemoglobin 1.0 0.0 - 1.5 %    Methemoglobin 0.3 0.0 - 1.0 %    O2 Saturation 96.8 92.0 - 98.5 %    pH (Temp Corrected) - Arterial      pCO2 (Temp Corrected) - Arterial  mmHg    pO2 (Temp Corrected) - Arterial  mmHg    Allen's Test NA     Sample Type Blood     Specimen Site AL     O2 Delivery Device Ventilator     FIO2 30     Ventilator Mode PRVC     Minute Ventilation 11     Tidal Volume 480.0 mL    Respiratory Rate 20.0 b/min    Peep 5.0 cmH2O    Mean Airway Pressure 10     Peak Inspiratory Pressure 21.0 cmH2O    Drawn By rn     Reported to Doland, MD, N.     Puncture Attempts 1     Time Analyzed [phone]     Analyzed By [facility] CHANDLER      Pending Labs     Order Current Status    Blood Gas-Arterial -See Comments for Frequency In process    Extra Tubes (ASA) -Next Routine In process    Red Top -Once In process    Blood Culture -x 2 Preliminary result    Blood Culture -x 2  Preliminary result    Respiratory culture -Next Routine Preliminary result        Recent Imaging:  No results found.     LOS: 9 days     Elby Showers  02/01/2016  7:49 AM

## 2016-02-01 NOTE — Progress Notes (Signed)
Vascular ultrasound preliminary report for bilateral lower extremity venous duplex.    Right Leg: (1) Acute occlusive thrombus in the peroneal and 1 of 2 posterior tibial veins from ankle to upper calf.   (2) Acute occlusive thrombus in 1 of 2 gastrocnemius vein, tip is approx 2cm from the popliteal vein.   (3) Acute occlusive thrombus in the GSV from knee to SFJ, thrombus does extend into the CFV and up to the mid EIV. Tip was clearly visualized and appears stable.     Left: No evidence of STP/DVT.

## 2016-02-01 NOTE — Progress Notes (Signed)
Nutrition Follow-Up    Brandy [name] is a 60 y.o. female, DOB 1956-01-19    Subjective:  Pt remains intubated and sedated. Trickle TF initiated yesterday. Pt remains on CRRT. Spoke with RN - no current issues with TF. Spoke with Dr. Epimenio Sarin, okay to advance TF to goal today.     Admit Anthropometric Measurements:   Ht: 167.6 cm (66)        Admit Wt: 101.4 kg (224 lbs)  IBW: 59.09 kg (130 lbs)   %IBW: 172%  BMI: 36.1 (no edema)   Ht/wt status:obese class 2              Nutrition-Focused Physical Findings:  Patient appears well-nourished and well-hydrated   Poor dentition per physician note   Nutrition risk factors: Current morbidity   Risk for nutritional deficit:high d/t current intubation       Food and Nutritional Intake:  Diet Orders:   Procedures   . Diet, NPO     PO Intake is inadequate     Other Allergies: Heparin and Sulfa (sulfonamide antibiotics)     Client History:  Admission Diagnosis: Respiratory failure (CMS/HCC) [J96.90]    Diagnosis:     SNOMED CT(R)   1. Aortic stenosis, severe  AORTIC VALVE STENOSIS   2. Acute systolic heart failure (CMS/HCC)  ACUTE SYSTOLIC HEART FAILURE   3. Demand ischemia of myocardium (CMS/HCC)  ACUTE ISCHEMIC HEART DISEASE   4. Non-ischemic cardiomyopathy (CMS/HCC)  CARDIOMYOPATHY   5. Iron deficiency anemia due to chronic blood loss  IRON DEFICIENCY ANEMIA   6. NSTEMI (non-ST elevated myocardial infarction) (CMS/HCC)  ACUTE NON-ST SEGMENT ELEVATION MYOCARDIAL INFARCTION   7. Obesity due to excess calories, unspecified obesity severity  SIMPLE OBESITY   8. S/P AVR (aortic valve replacement)  HISTORY OF AORTIC VALVE REPLACEMENT     Past Medical History:   Diagnosis Date   . History of blood transfusion      Past Surgical History:   Procedure Laterality Date   . PROCEDURE N/A 01/27/2016    Procedure: AORTIC VALVE REPLACEMENT; INTRA AORTIC BALLOON INSERTION;  Surgeon: Otho Bellows, MD;  Location: [facility] OR;  Service: Cardiology;  Laterality: N/A;   . TONSILLECTOMY     .  TUBAL LIGATION     . TUBAL LIGATION  1980       Usual meds include: Reviewed   Current meds include: Pepcid, lasix, insulin, zofran, potassium chloride    Potassium Protocol: Not noted   Bowel Protocol: Not noted   Propofol: None currently   Cleviprex: None currently   Pressors: Levophed  Prokinetics: None   Parayltics: None   Levothyroxine: None  Coumadin: None       Biochemical Data, Medical Tests and Procedures:  Lab Results   Component Value Date    NA 135 02/01/2016    K 3.8 02/01/2016    CL 101 02/01/2016    CO2 24.5 02/01/2016    GLU 121 (H) 02/01/2016    BUN 17 02/01/2016    CREATININES 1.62 (H) 02/01/2016    LABCREA 77 01/28/2016    MG 1.8 02/01/2016    PHOS 2.2 (L) 02/01/2016   Phos is low - recommend replacement     Nutrition Diagnosis (Problem, Etiology, Signs and Symptoms)  Inadequate protein-energy intake related to inability to consume nutrients via oral route as evidenced by mechanical ventilation and lack of nutrition support.   (Progress:  Progressing.)    Nutrition Intervention:   Admit Wt: 101.4 kg (224 lbs)  IBW: 59.09 kg (130 lbs)    Estimated needs:  11-14 kcals/kg actual wt or 1115 - 1420 kcals  2g protein/kg IBW or 118g protein (1.2g protein/kg actual wt)    TF recommendation would be for: Peptamen Intense VHP @ goal rate of 46mL/hr providing 1320 kcals, 121g protein, free water. Additional free water per physician.     Monitoring and Evaluation:   Will follow and monitor TF tolerance, labs, and status per nutrition protocol.      Follow Up: 1-3 days      Laqueta Due, RD  02/01/2016  2:20 PM  [facility]

## 2016-02-02 ENCOUNTER — Inpatient Hospital Stay: Admit: 2016-02-02 | Payer: MEDICAID

## 2016-02-02 LAB — POCT GLUCOSE
POC Glucose: 150 mg/dL — ABNORMAL HIGH (ref 80–99)
POC Glucose: 173 mg/dL — ABNORMAL HIGH (ref 80–99)
POC Glucose: 176 mg/dL — ABNORMAL HIGH (ref 80–99)
POC Glucose: 184 mg/dL — ABNORMAL HIGH (ref 80–99)
POC Glucose: 127 mg/dL — ABNORMAL HIGH (ref 80–99)

## 2016-02-02 LAB — ABG
A-a Gradient: 42.2 mmHg
A-a Gradient: 50.7 mmHg
Base Excess: 3 mmol/L — ABNORMAL HIGH (ref ?–2.0)
Base Excess: 1.7 mmol/L (ref ?–2.0)
Carboxyhemoglobin: 0.5 % (ref 0.0–1.5)
Carboxyhemoglobin: 0.4 % (ref 0.0–1.5)
FIO2: 30
FIO2: 30
HCO3 Arterial: 26.6 mmol/L — ABNORMAL HIGH (ref 22.0–26.0)
HCO3 Arterial: 25 mmol/L (ref 22.0–26.0)
Hemoglobin: 8.9 g/dL — ABNORMAL LOW (ref 12.0–16.0)
Hemoglobin: 9.2 g/dL — ABNORMAL LOW (ref 12.0–16.0)
Methemoglobin: 0.2 % (ref 0.0–1.0)
Methemoglobin: 0.2 % (ref 0.0–1.0)
Minute Ventilation: 15.5
O2Hb: 97.8 % — ABNORMAL HIGH (ref 94.0–97.0)
O2Hb: 98 % — ABNORMAL HIGH (ref 94.0–97.0)
PSV: 5
Patient Temperature: 37 C
Patient Temperature: 37 C
Peep: 5 cmH2O
Peep: 5 cmH2O
Puncture Attempts: 1
Respiratory Rate: 20 b/min
Respiratory Rate: 30 b/min
Tidal Volume: 480 mL
Tidal Volume: 560 mL
Time Analyzed: 20170823034428
Time Analyzed: 20170823132044
a/A Ratio: 0.74
a/A Ratio: 0.69
pCO2 Arterial: 36.4 mmHg (ref 35.0–45.0)
pCO2 Arterial: 33.9 mmHg — ABNORMAL LOW (ref 35.0–45.0)
pH Arterial: 7.481 — ABNORMAL HIGH (ref 7.350–7.450)
pH Arterial: 7.485 — ABNORMAL HIGH (ref 7.350–7.450)
pO2 Arterial: 119 mmHg — ABNORMAL HIGH (ref 80.0–100.0)
pO2 Arterial: 113.5 mmHg — ABNORMAL HIGH (ref 80.0–100.0)
sO2 Arterial: 98.5 % (ref 92.0–98.5)
sO2 Arterial: 98.6 % — ABNORMAL HIGH (ref 92.0–98.5)

## 2016-02-02 LAB — RENAL FUNCTION PANEL
Albumin: 2.4 g/dL — ABNORMAL LOW (ref 3.5–5.0)
Albumin: 2.3 g/dL — ABNORMAL LOW (ref 3.5–5.0)
Albumin: 2.4 g/dL — ABNORMAL LOW (ref 3.5–5.0)
Albumin: 2.5 g/dL — ABNORMAL LOW (ref 3.5–5.0)
Anion Gap: 8.4 mmol/L (ref 3.0–11.0)
Anion Gap: 8.1 mmol/L (ref 3.0–11.0)
Anion Gap: 9.1 mmol/L (ref 3.0–11.0)
Anion Gap: 7.2 mmol/L (ref 3.0–11.0)
BUN: 16 mg/dL (ref 6–23)
BUN: 17 mg/dL (ref 6–23)
BUN: 18 mg/dL (ref 6–23)
BUN: 20 mg/dL (ref 6–23)
CO2 - Carbon Dioxide: 24.6 mmol/L (ref 21.0–31.0)
CO2 - Carbon Dioxide: 24.9 mmol/L (ref 21.0–31.0)
CO2 - Carbon Dioxide: 24.9 mmol/L (ref 21.0–31.0)
CO2 - Carbon Dioxide: 24.8 mmol/L (ref 21.0–31.0)
Calcium: 9.2 mg/dL (ref 8.6–10.3)
Calcium: 9.2 mg/dL (ref 8.6–10.3)
Calcium: 9.3 mg/dL (ref 8.6–10.3)
Calcium: 9.3 mg/dL (ref 8.6–10.3)
Chloride: 100 mmol/L (ref 98–111)
Chloride: 100 mmol/L (ref 98–111)
Chloride: 101 mmol/L (ref 98–111)
Chloride: 102 mmol/L (ref 98–111)
Creatinine: 1.37 mg/dL — ABNORMAL HIGH (ref 0.55–1.10)
Creatinine: 1.37 mg/dL — ABNORMAL HIGH (ref 0.55–1.10)
Creatinine: 1.23 mg/dL — ABNORMAL HIGH (ref 0.55–1.10)
Creatinine: 1.19 mg/dL — ABNORMAL HIGH (ref 0.55–1.10)
GFR Estimate: 39 mL/min/{1.73_m2} — ABNORMAL LOW (ref >=60)
GFR Estimate: 39 mL/min/{1.73_m2} — ABNORMAL LOW (ref >=60)
GFR Estimate: 45 mL/min/{1.73_m2} — ABNORMAL LOW (ref >=60)
GFR Estimate: 46 mL/min/{1.73_m2} — ABNORMAL LOW (ref >=60)
Glucose: 152 mg/dL — ABNORMAL HIGH (ref 80–99)
Glucose: 173 mg/dL — ABNORMAL HIGH (ref 80–99)
Glucose: 188 mg/dL — ABNORMAL HIGH (ref 80–99)
Glucose: 128 mg/dL — ABNORMAL HIGH (ref 80–99)
Phosphorus: 2.3 mg/dL — ABNORMAL LOW (ref 2.5–4.5)
Phosphorus: 2.4 mg/dL — ABNORMAL LOW (ref 2.5–4.5)
Phosphorus: 2 mg/dL — ABNORMAL LOW (ref 2.5–4.5)
Phosphorus: 2.6 mg/dL (ref 2.5–4.5)
Potassium: 4.1 mmol/L (ref 3.5–5.1)
Potassium: 4.2 mmol/L (ref 3.5–5.1)
Potassium: 4.1 mmol/L (ref 3.5–5.1)
Potassium: 3.9 mmol/L (ref 3.5–5.1)
Sodium: 133 mmol/L — ABNORMAL LOW (ref 135–143)
Sodium: 133 mmol/L — ABNORMAL LOW (ref 135–143)
Sodium: 135 mmol/L (ref 135–143)
Sodium: 134 mmol/L — ABNORMAL LOW (ref 135–143)

## 2016-02-02 LAB — CBC WITH AUTO DIFFERENTIAL
Basophils %: 0 % (ref 0–2)
Basophils, Absolute: 0 10*3/ÂµL (ref 0.0–0.2)
Eosinophils %: 0 % (ref 0–7)
Eosinophils, Absolute: 0 10*3/ÂµL (ref 0.0–0.7)
HCT: 25.9 % — ABNORMAL LOW (ref 37.0–48.0)
Hemoglobin: 8.1 g/dL — ABNORMAL LOW (ref 12.0–16.0)
Lymphocytes %: 7 % — ABNORMAL LOW (ref 25–45)
Lymphocytes, Absolute: 1.1 10*3/ÂµL (ref 1.1–4.3)
MCH: 22.4 pg — ABNORMAL LOW (ref 27.0–34.0)
MCHC: 31.2 g/dL — ABNORMAL LOW (ref 32.0–36.0)
MCV: 71.8 fL — ABNORMAL LOW (ref 81.0–99.0)
MPV: 9 fL (ref 7.4–10.4)
Monocytes %: 13 % — ABNORMAL HIGH (ref 0–12)
Monocytes, Absolute: 2.1 10*3/ÂµL — ABNORMAL HIGH (ref 0.0–1.2)
Neutrophils %: 80 % — ABNORMAL HIGH (ref 35–70)
Neutrophils, Absolute: 13 10*3/ÂµL — ABNORMAL HIGH (ref 1.6–7.3)
Platelet Count: 32 10*3/ÂµL — ABNORMAL LOW (ref 150–400)
Platelet Estimate: DECREASED — AB
RBC: 3.6 10*6/ÂµL — ABNORMAL LOW (ref 4.20–5.40)
RDW: 25 % — ABNORMAL HIGH (ref 11.5–14.5)
WBC: 16.2 10*3/ÂµL — ABNORMAL HIGH (ref 4.8–10.8)

## 2016-02-02 LAB — IONIZED CALCIUM
Ionized Calcium: 1.31 mmol/L — ABNORMAL HIGH (ref 1.12–1.30)
Ionized Calcium: 1.35 mmol/L — ABNORMAL HIGH (ref 1.12–1.30)
Ionized Calcium: 1.37 mmol/L — ABNORMAL HIGH (ref 1.12–1.30)
Ionized Calcium: 1.33 mmol/L — ABNORMAL HIGH (ref 1.12–1.30)

## 2016-02-02 LAB — COMPREHENSIVE METABOLIC PANEL
ALT - Alanine Aminotransferase: 5 IU/L — ABNORMAL LOW (ref 7–52)
AST - Aspartate Aminotransferase: 34 IU/L (ref 8–39)
Albumin/Globulin Ratio: 0.9 — ABNORMAL LOW (ref >0.9)
Albumin: 2.3 g/dL — ABNORMAL LOW (ref 3.5–5.0)
Alkaline Phosphatase: 128 IU/L — ABNORMAL HIGH (ref 34–104)
Anion Gap: 8.1 mmol/L (ref 3.0–11.0)
BUN: 17 mg/dL (ref 6–23)
Bilirubin Total: 1.2 mg/dL (ref 0.3–1.2)
CO2 - Carbon Dioxide: 24.9 mmol/L (ref 21.0–31.0)
Calcium: 9.2 mg/dL (ref 8.6–10.3)
Chloride: 100 mmol/L (ref 98–111)
Creatinine: 1.37 mg/dL — ABNORMAL HIGH (ref 0.55–1.10)
GFR Estimate: 39 mL/min/{1.73_m2} — ABNORMAL LOW (ref >=60)
Globulin: 2.6 g/dL (ref 2.2–3.7)
Glucose: 173 mg/dL — ABNORMAL HIGH (ref 80–99)
Potassium: 4.2 mmol/L (ref 3.5–5.1)
Protein Total: 4.9 g/dL — ABNORMAL LOW (ref 6.0–8.0)
Sodium: 133 mmol/L — ABNORMAL LOW (ref 135–143)

## 2016-02-02 LAB — APTT
APTT: 74 Seconds — ABNORMAL HIGH (ref 23–36)
APTT: 73 Seconds — ABNORMAL HIGH (ref 23–36)

## 2016-02-02 LAB — MAGNESIUM
Magnesium: 1.9 mg/dL (ref 1.6–2.4)
Magnesium: 1.9 mg/dL (ref 1.6–2.4)
Magnesium: 1.8 mg/dL (ref 1.6–2.4)
Magnesium: 1.9 mg/dL (ref 1.6–2.4)

## 2016-02-02 LAB — PHOSPHORUS: Phosphorus: 2.4 mg/dL — ABNORMAL LOW (ref 2.5–4.5)

## 2016-02-02 MED ORDER — amiodarone 360 mg in D5W 200mL (1.8 mg/mL) premix infusion
360 | INTRAVENOUS | Status: DC | PRN
Start: 2016-02-02 — End: 2016-02-09
  Administered 2016-02-02: 14:00:00 3602001.8 mg/min via INTRAVENOUS
  Administered 2016-02-02 (×2): 360 mg/min via INTRAVENOUS
  Administered 2016-02-03: 11:00:00 3602001.8 mg/min via INTRAVENOUS
  Administered 2016-02-03: 20:00:00 360 mg/min via INTRAVENOUS
  Administered 2016-02-03: 22:00:00 3602001.8 mg/min via INTRAVENOUS
  Administered 2016-02-03: 09:00:00 360 mg/min via INTRAVENOUS
  Administered 2016-02-04 (×3): 3602001.8 mg/min via INTRAVENOUS
  Administered 2016-02-04: 07:00:00 360 mg/min via INTRAVENOUS
  Administered 2016-02-04 – 2016-02-05 (×2): 3602001.8 mg/min via INTRAVENOUS
  Administered 2016-02-06: 13:00:00 360 mg/min via INTRAVENOUS
  Administered 2016-02-07: 03:00:00 3602001.8 mg/min via INTRAVENOUS
  Administered 2016-02-07 (×2): 360 mg/min via INTRAVENOUS
  Administered 2016-02-08 (×3): 3602001.8 mg/min via INTRAVENOUS

## 2016-02-02 MED FILL — NOREPINEPHRINE BITARTRATE 4 MG/250 ML (16 MCG/ML) IN DEXTROSE 5 % IV: 4 mg/250 mL (16 mcg/mL) | INTRAVENOUS | Qty: 250

## 2016-02-02 MED FILL — SOLU-CORTEF ACT-O-VIAL (PF) 100 MG/2 ML SOLUTION FOR INJECTION: 100 | INTRAMUSCULAR | Qty: 2

## 2016-02-02 MED FILL — PRISMASOL BGK K (2 MEQ/L)-MG(1MEQ/L) HEMODIALYSIS SOLUTION: ARTERIOVENOUS_FISTULA | Qty: 5

## 2016-02-02 MED FILL — ARGATROBAN 50 MG/50 ML (1 MG/ML) IN SODIUM CHLORIDE (ISO-OSMOTIC) IV: 50 mg/50 mL (1 mg/mL) | INTRAVENOUS | Qty: 50

## 2016-02-02 MED FILL — MILRINONE 20 MG/100 ML(200 MCG/ML) IN 5 % DEXTROSE INTRAVENOUS PIGGYBK: 200 ug/mL | INTRAVENOUS | Qty: 100

## 2016-02-02 MED FILL — FAMOTIDINE (PF) IN 0.9% NACL 20 MG/50 ML IVPB PREMIX: 20 mg/50 mL | INTRAVENOUS | Qty: 50

## 2016-02-02 MED FILL — DEXMEDETOMIDINE 100 MCG/ML INTRAVENOUS SOLUTION: 100 ug/mL | INTRAVENOUS | Qty: 2

## 2016-02-02 MED FILL — SODIUM PHOSPHATE 3 MMOL/ML INTRAVENOUS SOLUTION: 3 mmol/mL | INTRAVENOUS | Qty: 5

## 2016-02-02 MED FILL — DEXMEDETOMIDINE 100 MCG/ML INTRAVENOUS SOLUTION: 100 ug/mL | INTRAVENOUS | Qty: 4

## 2016-02-02 MED FILL — NEXTERONE 360 MG/200 ML (1.8 MG/ML) INTRAVENOUS SOLUTION: 360 mg/200 mL (1.8 mg/mL) | INTRAVENOUS | Qty: 360

## 2016-02-02 MED FILL — MAGNESIUM SULFATE 1 GRAM/100 ML IN DEXTROSE 5 % INTRAVENOUS PIGGYBACK: 1 gram/100 mL | INTRAVENOUS | Qty: 100

## 2016-02-02 MED FILL — PRECEDEX 100 MCG/ML INTRAVENOUS SOLUTION: 100 ug/mL | INTRAVENOUS | Qty: 4

## 2016-02-02 MED FILL — LEVOFLOXACIN IN D5W 750 MG/150 ML IVPB PREMIX: 750 mg/150 mL | INTRAVENOUS | Qty: 150

## 2016-02-02 NOTE — Other (Signed)
SOCIAL WORK      Name:Brandy [name] Date of Birth:09-10-1955   MRN#: [id number] Admitting Diagnosis:Respiratory failure (CMS/HCC) [J96.90]     Patient/Family Plan: TBD      PATIENT HISTORY      Support System and Current Services:  Heritage manager (Current)  Living Arrangements: Spouse/significant other  Type of Residence: Private residence  Support Systems: Spouse/significant other, Children, Family members, Friends/neighbors   Prior Level of Function  Potential Risk Factors: Change in care needs, Severity of injury/illness  Mobility: Ambulatory, Independent        ASSESSMENT     Social Work  Sport and exercise psychologist to Consent: [employer] of Residence: [place]  Income Source: Unemployed, Household Income  Marital Status of patient or parent/guardian : Married  Family Notification (Name and Number): Denai Caba (Spouse [phone])  Current Health Services/Contacts:  (None)       NARRATIVE     Chart screened for LOS >10 days. Pt admitted with respiratory failure, s/p AVR 6 days ago. She is currently on a vent and CRRT. Spoke with pt's spouse Jonny Ruiz [facility]) at the bedside in CCU to introduce myself/role and offer support. [name] shared that he and pt have been married for 24 years and live in [place] in a home on 10 acres. Together they have 5 children and 7 grandchildren who live in [place], [place], and [place]. Pt's sister Baird Lyons lives in [place]. 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 He describes her as handy, determined, and self-sufficient. She can operate any kind of machinery, hunts, and helps with all kinds of tasks around the home and property including building fences. She had been healthy most of her life until several weeks ago when she developed a cough after a trip to [place]. She did not have health insurance nor did she see a doctor for primary care.     [name] expressed a  strong faith in his faith and God, everything is in his hands. He appreciates the care pt has been receiving and feels especially hopeful after yesterday when she squeezed his hand. He travels to [place] every few days to see his wife and feels well updated by the medical staff. He had expressed concerns about pt's medical bills; I assured him that she had qualified for [facility] and already has an ID number as an application was started at [facility] where pt initially went to seek care on 01/21/16.  I offered ongoing support and assistance as needed. When he comes to town he stays with Baird Lyons at her home and thus declined the [place] [facility].     SW will continue to follow.     Berkley Harvey, LCSW

## 2016-02-02 NOTE — Progress Notes (Signed)
CARDIOTHORACIC SURGERY CRITICAL CARE NOTE     Name: Brandy [name]  DOB: 1955-10-09 60 y.o.  MRN: [id number]  CSN: [id number]    Brandy [name] is a 60 y.o. female patient. Patient was admitted with Respiratory failure (CMS/HCC)  Who had Procedure(s):  AORTIC VALVE REPLACEMENT; INTRA AORTIC BALLOON INSERTION.  6 Days Post-Op    HPI: The patient is a 60 y.o. female with the below medical co-morbid conditions.    ?  1. Severe symptomatic aortic stenosis                        Ejection fraction 30%     Found on TEE intra-op to be 15%                        Normal coronaries                        Predominant symptom dyspnea                     2. Severe Pulmonary Hypertension  3. Mild to Moderate Mitral regurgitation  4. Morbid obesity                        Weight loss 50 pounds in the last one year  5. respiratory insufficiency  6. Non-ST elevation MI                        The demand ischemia  7. Iron deficiency anemia                        CT chest abdomen and pelvis reviewed no abdominal masses                        Likely AVM secondary to aortic stenosis not a good candidate for sedation and EGD or colonoscopy well aortic stenosis is so critical  ?  Patient's a 60 year old female who presented the [facility] falls in respiratory distress unable to breathe.  There are her troponin was elevated she underwent oxygen therapy and was given Lasix.  Cardiac workup showed normal coronary arteries with depressed LV function on echo and severe aortic stenosis valve area less than 0.4 velocity 4.4 m/s with an EF 30%.  She was transfused 2 units of PRBCs A [facility] hospital with a hemoglobin 8.8.  His immobility volume overload from heart failure  ?  Patient hasn't been to the doctor in years she hasn't taken any medications nor does she take good care of herself.  [place] she is unable to consistently take meds.  She likes to use and asked to [employer] wood to heat her house.    ?  After a few  days of Lasix patient states that she was breathing much better she is 60% better per her suggestion.  I had a long talk with the family about the disease course why she was getting short of breath and discussion of heart failure.  They understand that she is given a need aortic valve replacement she wishes to have tissue valve I do think she is a better surgical candidate and have her given her age and risk.  ?  She has very poor dentition    POD #1  Patient has  been stable on balloon 1:2   Methylene blue last night SVR much better   Coronary perfusion good   Today I am managing acute heart failure with management of the IABP.    POD #2  removal of the intra-aortic balloon pump renal ultrasound completed showing minimal flow to the kidneys  patient still making urine  POD #3 CRRT started  POD #4 CRRT running acid base more stable pressures much better off NEO and LEVOPHED improved making urine  POD#5 CRRT continues   Patient plt 25   Will check for DVT   Postoperative day #6 CRRT continues platelets now up to 32 treating for heparin-induced from cytopenia    Subjective: making progress    Assessment & Plan:     Active Hospital Problems    Diagnosis SNOMED CT(R) Date Noted   . S/P AVR (aortic valve replacement) HISTORY OF AORTIC VALVE REPLACEMENT 01/27/2016   . Respiratory failure (CMS/HCC) RESPIRATORY FAILURE 01/23/2016   . Aortic stenosis, severe AORTIC VALVE STENOSIS 01/22/2016   . NSTEMI (non-ST elevated myocardial infarction) (CMS/HCC) ACUTE NON-ST SEGMENT ELEVATION MYOCARDIAL INFARCTION 01/21/2016   . Demand ischemia of myocardium (CMS/HCC) ACUTE ISCHEMIC HEART DISEASE 01/21/2016   . Iron deficiency anemia IRON DEFICIENCY ANEMIA 01/21/2016   . Obesity OBESITY 01/21/2016     Neurologic: No issues sedated     Cardiac: Pre-op EF:   EF 15% prior to surgery   Post op 30% with good valve functdion 23mm valve     1. Inotropes turning off milrinone today   2. Pressors: Levophed at 2         3. Rhythm: AFIB rates  better controlled on amiodarone   4. Aspirin: 300 rectal   5. Beta-Blocker: hold   6. Statin: hold   7. ACE/ARB: hold        Respiratory:    Smoker none   Pulmonary toilet    Xray: improved       Gastrointestinal:    Genitourinary: creat: pre op 0.9   Now 1.16 >2.6 up to 3.4>3.26>1.85>1.37  ON CRRT     Infectious Disease:  WBC: 17>18> 19.4>16.8>13.5    Hematology: HGB 9.8>8.6>7.7>7.5   PLTS 180> 91>64>32>25>32  Checked hitt  ++++   Stopped all heparin and started agatroban  Endocrine: Glucose management   Electrolytes: replacing Mg/K/Ca  Prophylaxis: Heparin PPI/H2 Blocker     Scheduled Medications:   . albuterol  2.5 mg Nebulization Q4H   . amiodarone  400 mg Oral Q12H   . aspirin  300 mg Rectal Daily   . chlorhexidine  15 mL Mouth/Throat Q12H   . famotidine  20 mg Intravenous Daily   . hydrocortisone sod succ (PF)  50 mg Intravenous Q6H   . levofloxacin (LEVAQUIN) IV  750 mg Intravenous Q24H   . phenylephrine       . phenylephrine       . sodium chloride 0.9 % (NS) syringe  10 mL Intravenous Q8H SCH   . sterile water (bottle)  30 mL Irrigation Q4H     Infusions:   . amiodarone in D5W 0.5 mg/min (02/02/16 0728)   . argatroban 0.5 mcg/kg/min (02/02/16 0727)   . clevidipine Stopped (02/02/16 0800)   . dexmedetomidine 0.7 mcg/kg/hr (02/02/16 0729)   . dextrose 10% (D10W)     . dextrose 5% lactated ringers Stopped (01/29/16 1829)   . DOBUTamine (DOBUTREX) 250 mg in D5W 250 mL (1000 mcg/mL) infusion     . fentanyl Stopped (02/01/16 0739)   .  insulin (HUMAN R) 150 Units in NaCl 0.9 % 150 mL (1 unit/mL) infusion - cardiac surgery Stopped (01/29/16 1841)   . midazolam Stopped (01/31/16 0900)   . milrinone Stopped (02/02/16 0736)   . nitroglycerin     . norepinephrine 3 mcg/min (02/02/16 0744)   . phenylephrine (NEO-SYNEPHRINE) 40 mg in NaCl 0.9 % 500 mL (80 mcg/mL) infusion **DOUBLE CONCENTRATION** Stopped (01/31/16 0515)   . prismasate with additives 1,500 mL/hr at 02/02/16 0541   . prismasate with additives 500 mL/hr at  02/02/16 0846   . prismasate with additives 500 mL/hr at 02/02/16 0846   . vasopressin (PITRESSIN) 50 units in NaCl 0.9 % 250 mL (0.2 units/mL) infusion **ADULT SHOCK** Stopped (01/28/16 1711)     Recent Labs:  Lab Results   Component Value Date    NA 133 (L) 02/02/2016    NA 133 (L) 02/02/2016    K 4.2 02/02/2016    K 4.2 02/02/2016    K 3.69 01/23/2016    CL 100 02/02/2016    CL 100 02/02/2016    CL 104.1 01/23/2016    CO2 24.9 02/02/2016    CO2 24.9 02/02/2016    CO2 20 (L) 01/23/2016    GLU 173 (H) 02/02/2016    GLU 173 (H) 02/02/2016    GLU 85 01/23/2016    BUN 17 02/02/2016    BUN 17 02/02/2016    CREATININES 1.37 (H) 02/02/2016    CREATININES 1.37 (H) 02/02/2016    CALCIUM 9.2 02/02/2016    CALCIUM 9.2 02/02/2016    AST 34 02/02/2016    ALT 5 (L) 02/02/2016    ALKPHOS 128 (H) 02/02/2016    BILITOT 0.90 01/21/2016    ALBUMIN 2.3 (L) 02/02/2016    ALBUMIN 2.3 (L) 02/02/2016    GLOB 2.6 02/02/2016    AGRATIO 0.9 (L) 02/02/2016    ANIONGAP 8.1 02/02/2016    ANIONGAP 8.1 02/02/2016    ANIONGAP 21.0 (H) 01/23/2016    LABGLOM 39 (L) 02/02/2016    LABGLOM 39 (L) 02/02/2016    LABGLOM >60.0 01/23/2016    WHITEBLOODCE 16.2 (H) 02/02/2016    LABPLAT 32 (L) 02/02/2016    HGB 8.1 (L) 02/02/2016    PROTIME 43.5 (H) 02/01/2016    INR 3.8 (H) 02/01/2016    APTT 73 (H) 02/02/2016          Vital Signs:      ? Cardiac output CCO: 5.1 L/Min    ? Cardiac Index CCI: 2.3 L/min  Temp: 36.1 ?C (97 ?F) BP: (!) 124/46 Pulse: (!) 112 Resp: 22 SpO2: 99 % on   Ventilator   Intake/Output:     CT OUTPUT: 360    URINE OUTPUT: 40    Exam:  Neuro: Intubated but follows commands  Cardiac: Atrial fibrillation irregular no murmurs  Lungs;clear  ABDOMEN:abdomen is soft without significant tenderness, masses, organomegaly or guarding  EXTREMITIES 2+ edema in the lower extremities toes appear much better digital ischemia much improved    PLAN:   Neurologically she's doing better does follow some commands  From a cardiac standpoint her heart is  improving  Heparin-induced thrombocytopenia off all heparin products on a gastric band PTT greater than 70  CRRT for acute renal failure    Critical Care Time: 40 minutes outside any procedures and family discussion.  Patient remains critical and at high risk for organ failure and death.      Otho Bellows

## 2016-02-02 NOTE — Progress Notes (Signed)
PHARMACIST CRITICAL CARE PROGRESS NOTE     Consult Per P&T Protocol: Critical Care Physician Service, Antimicrobial Interventions, Renal Dosing, IV to PO         Summary: Brandy [name] is [name] admitted on 01/23/2016 with a NSTEMI s/p R/L heart craterization.     Subjective: I visited the patient, but was unable to explain the pharmacist plan of care to the patient due to sedation and/or intubation.    Allergies: Heparin and Sulfa (sulfonamide antibiotics)    PMH:  has a past medical history of History of blood transfusion.     Surgical history:  has a past surgical history that includes Tonsillectomy; Tubal ligation; Tubal ligation (1980); and ocedure (N/A, 01/27/2016).    Active problems:  ? Aortic Valve replacement  ? Acute systolic heart failure  ? NSTEMI  ? 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 Diet, NPO       Height: 167.6 cm (66) Admit weight: 101.4 kg (223 lb 8.7 oz) Last charted weight: 113.2 kg (249 lb 9 oz) IBW: 59.3 kg Body mass index is 40.28 kg/(m^2).    I/O last 3 completed shifts:  In: 4575 [I.V.:2835.1; NG/GT:1340.6; IV Piggyback:399.3]  Out: 5320 [Urine:65; ZOXWR:6045; Stool:25; Chest Tube:760]    Recent Labs      01/31/16   0410   02/01/16   0347   02/01/16   2213  02/02/16   0349  02/02/16   0930   NA  133*  133*  133*   < >  134*   < >  133*  133*  133*  135   K  4.2  4.2  4.2   < >  3.8   < >  4.1  4.2  4.2  4.1   CL  103  103  103   < >  102   < >  100  100  100  101   CO2  21.9  21.9  21.9   < >  24.7   < >  24.6  24.9  24.9  24.9   GLU  97  97  97   < >  145*   < >  152*  173*  173*  188*   BUN  31*  31*  31*   < >  18   < >  16  17  17  18    CREATININES  3.26*  3.26*  3.26*   < >  1.85*   < >  1.37*  1.37*  1.37*  1.23*   CALCIUM  9.0  9.0  9.0   < >  9.0   < >  9.2  9.2  9.2  9.3   AST  39   --   37   --    --   34   --    ALT  4*   --   3*   --    --   5*   --    ALBUMIN   2.4*  2.4*   < >  2.3*   < >  2.4*  2.3*  2.3*  2.4*    < > = values in this interval not displayed.     Recent Labs      02/02/16   0349  02/02/16   0931  02/02/16  1614   CAION  1.35*  1.37*  1.33*     Recent Labs      02/01/16   2213  02/02/16   0349  02/02/16   0930   MG  1.9  1.9  1.8   PHOS  2.3*  2.4*  2.4*  2.0*     Recent Labs      02/01/16   1548  02/01/16   2150  02/02/16   0353  02/02/16   0930  02/02/16   1318  02/02/16   1613   POCGLU  140*  150*  173*  176*  184*  127*     Recent Labs      01/31/16   0410  02/01/16   0347  02/01/16   1008  02/02/16   0349   WHITEBLOODCE  16.8*  13.5*  12.6*  16.2*   NEUTROABS  14.8*  10.9*   --   13.0*   HGB  7.7*  7.5*  7.7*  8.1*   HCT  25.1*  24.9*  25.0*  25.9*   LABPLAT  32*  25*  24*  32*       Temp (24hrs), Avg:36.3 ?C (97.4 ?F), Min:36.1 ?C (97 ?F), Max:36.8 ?C (98.2 ?F)      Relevant Microbiology:  Date: Source: Results:  Status:   8/20 Bronc Wash  Viral/Fungal Viral negative; fungal in process Preliminary   8/20 BAL NGTD Final   8/19 Blood x 4 NGTD Pending   8/19 ET Asp NGTD Final     ASSESSMENT:     Estimated CrCl: CRRT (started 8/20 PM)    Critical Care Prophylaxis:  VAP: Peridex 0.12% 15 mL BID   VTE: PLT 32    Heparin products discontinued.  Argatroban infusion initiated.  Anti-embolism stockings   SUP: Pepcid 20 mg IV daily     CBG Monitoring:   ? CBG goal in critically ill: 140-180  ? CBG in past 24H: <184      Antimicrobial Monitoring:   Indication for antimicrobials: Sepsis of unknown origin (per Intensivist 8/19)   Current antimicrobials: Levaquin 750 mg IV Q24H (8/21-current)   Relevant previous antimicrobials: Levaquin 750 mg IV Q48H (8/19)   # of days of therapy directed at source of infection or pathogen: Day 5 on 8/23   Comments about duration? Dependent on source  General sepsis: 7-10 days (Surviving Sepsis Campaign)   Notes about antimicrobial therapy or microbiology data?        PLAN:     Critical Care Prophylaxis:  ? Prophylactic  medications ordered    ? Post op thrombocytopenia PLT 32, Heparin products discontinued.  Argatroban infusion started and currently at 0.5 mcg/Kg/min  ? Monitor for s/sx of bleeding    Antimicrobial Interventions:   ? Levaquin started empirically for sepsis  ? Will follow    Renal Dosing:   ? CRRT started 8/20 in PM  ? Levaquin at 750 mg IV Q24H      IV to PO:   ? No switches at the moment     Argatroban infusion:  ? Using the Argatroban protocol, I have discontinued Heparin products and enter heparin as an allergy.  ? This is a critically ill patient s/p surgery, aortic valve replacement, NSTEMI, On CRRT  ? I have been told that Brandy [name] has a trombosis in a lower extremity.  ? Argatroban at 0.5mcg/Kg/min (3.36ml/hr)  ? aPTT currently within goal of 1.5 - 3 times baseline and  not > 100 sec.  ? Were warfarin to be considered- it may NOT be added to therapy until platelets return to 150K or greater and aPTT is stable.    Signed by: Drucie Ip, RPh

## 2016-02-02 NOTE — Other (Signed)
IP PT EVALUATION / INITIAL TREATMENT (GENERAL)    [name] / [birth year]-01 DOB:  02-Jul-1955 / 60 y.o. Date:  02/02/2016     Precautions  Specific mobility precautions: Sternal precautions  Sternal precautions: Standard  Other: CRRT L chest wall     ASSESSMENT     Summary:  Pt is 60 yo female admitted from [facility] with resp distress and NSTEMI. Pt underwent AVR on 8/17. She has had a complex hospital course is currently intubated and on CRRT. 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   Even though drowsy and intubated, she was able to follow simple one step instructions and participate in AAROM. She was able to move all her extremities against gravity but not through full ROM. L shoulder motion especially limited by CRRT catheter in L chest wall. Will cont to see while IP to address her impaired mobility, decreased strength, balance and endurance. Anticipate that she may need continued rehab and therapy services when she is appropriate for dc but recommendations are too early TBD at this time.     Activity tolerance: Tolerates 10 - 20 min activity  Comments: Currently intubated and on CRRT; limited activity tolerance     Rehab Potential: Guarded     Precaution Awareness: Unaware of precaution(s)  Deficit Awareness: Decreased awareness of deficits  Correction of Errors: Unaware of errors  Safety Judgment: Unable to assess     Consult recommendations  Pt. would benefit from Discharge Planner consult.   Discharge recommendations  Mobility Equipment needs: To be determined later   Primary recommendation : Too early to determine post-discharge recommendations.     PLAN     Treatment Plan Frequency    Plan: Ther Ex, Bed mobility training, Transfer training, Gait training, Stair training, ADL training, Therapeutic Activity training, Pt/caregiver training, Muscle  Re-Education, DME needs assessment  1x (5 days/week).  Frequency will be increased as pt's condition or discharge plan indicates.     Aftyn Nott will remain on the Physical Therapy schedule until goals are met, or there has been a change in the Plan of Care.          Treatment Diagnosis Surgery / # Days Post-op (if applicable)    Primary Dx: Resp failure  Treatment Dx: altered mobility  Procedure(s) (LRB):  AORTIC VALVE REPLACEMENT; INTRA AORTIC BALLOON INSERTION (N/A) / 6 Days Post-Op       Pertinent Past Medical Hx:   Past Medical History:   Diagnosis Date   . History of blood transfusion           SUBJECTIVE   Pt intubated but able to follow simple instruction. Nod head, holds up fingers upon request.     Pain Level  Current status: Pain does not limit pt's ability to participate in therapy  Pain at start of session: 0 - No Pain  Pain at end of session: 0 - No Pain   Hx of current problem  Chief complaint: Progressive dyspnea since June. Per H&P:  Admitted to [facility]  with respiratory distress, pulmonary edema, NSTEMIC, anemia requiring 2 units PRBCs. ECHO showed severe aortic stenosis, EF 30%. Heart cath demonstrated no significant coronary occlusion.    General symptoms: Shortness of breath (cough)   Social history   Lives with: Spouse (Husband)  Support Systems: Husband  and family  Social Activities: Walking/gardening/housework/occasional exercise   Employment status: Not working  Physical Demands: Moderate   Prior LOF  Prior ambulatory status: Walked without assistive device  Home/Community: Pt. was independent in home & community   Home environment  Living situation: House  Home style: One level  Number of stairs to enter home: 4-5  Railings into home: Double railing  Number of stairs inside home: 0  Railings inside home: No railing   Home equipment  Mobility Equipment: Front wheeled walker (FWW);Manual wheelchair     OBJECTIVE   PT eval performed. AAROM all extremities. Some limitations in  ROM due to edema and soft tissue approximation, as well as line placement (CRRT L chest wall).     Mental status  General Demeanor: Other (comment);Pleasant;Cooperative  LOC: Drowsy  Orientation: Oriented to:  Oriented to: Person  Directions: Follows one-step commands  --> Follows one-step commands: Intermittently  Attention span: Has difficulty staying on task, even with cues   Medical appliances  Medical Appliances: ET tube;NG feeding tube;Chest tube drain;Rectal Tube/[employer];ECG;BP monitor;SPO2 monitor;Central line;Arterial line - radial;Catheter (CRRT access in L chest wall; ventilator)   Strength  Upper extremities: < 3/5 bilaterally  Lower extremities: < 3/5 bilaterally   ROM  ROM  Upper extremities: Limited ROM bilaterally (edema bil; CRRT in L chest wall limited shoulder assess)  Lower extremities: Limited ROM bilaterally (somewhat limited by edema soft tissue.)   Sensation      Other  Observation  Skin: Temperature (feet and hands cold to touch, somewhat dusky)  Narrative: edema all extremities +3      TREATMENT  Bed Mobility/Transfers  Bed mobility       Rolling / logroll         Transition to sitting up     To resting position      Transition to sitting down       Transition to supine       Scooting     Transfers      Transition to standing       Method          Ther. Ex.  UE exercises:: AAROM bil shoulders, elbows, wrist, finger; limited L shoulder  LE exercises: AAROM (AAROM hip, knee, ankle)     Training/Education  Trainee(s): Primary Learner;Co-Learner  Primary Learner: Patient  Primary Learner - Barriers to Learning?: Yes  Primary Learner - Specific barriers: Physical;Emotional  Co-learner: Spouse / Significant other  Co-Learner - Barriers to Learning?: No  Training provided: Goals and benefits of therapy  Response to training: Pt will need reinforcemenbt; husband receptive   Safety/Room Set-up   Call button accessible?: No, as pt. is with staff.  Phone accessible?: No  Oxygen reconnected?: No, not  disconnected during therapy  Position on arrival: In bed  Position on departure (bed): 4 bedrails up, as per patient status on presentation*  Comments: Staff at side, pt in ICU bed with rails up     GOALS     Patient/Caregiver goals reviewed and integrated with rehab treatment plan:     Multidisciplinary Problems (Active)        Problem: Mobility: Ambulation    Goal Priority Disciplines Outcome   Gait (with precautions)     PT    Description:  Pt. to ambulate 50 feet with one person min assist, using FWW, following any pertinent precautions.    Gait [employer])     PT    Description:  Problem: Mobility: Transfers    Goal Priority Disciplines Outcome   Supine to/from sitting (with precautions)     PT    Description:  Pt. to transfer to/from sitting with one person min assist,  following any pertinent precautions.    Sitting to/from standing (with precautions)     PT    Description:  Pt. to transfer sitting to/from standing with one person min assist, following any pertinent precautions.    Bed to/from chair (with precautions)     PT    Description:  Pt. to transfer bed to/from chair with one person min assist, following any pertinent precautions.           Problem: Patient/Family Goal    Goal Priority Disciplines Outcome   Normal Status     PT    Description:  Pt. wants to return to previous level of function.     Mobility     PT    Description:  Patient wants to walk again.           Problem: Strength    Goal Priority Disciplines Outcome   Home Exercise Program (with precautions)     PT    Description:  Pt. to perform basic HEP independently following any pertinent precautions.                -  First session Second session (if applicable)    Start/Stop times: 1450 - 1534 Start/Stop times:   - Stop Time:     Total time: 44 minutes  Total time:   minutes     This note to serve as a Discharge Summary if Camilah Spillman is discharged from the hospital or from therapy services.     Therapist: Penelope Coop, PT

## 2016-02-02 NOTE — Discharge Planning (AHS/AVS) (Signed)
Chart screened for LOS at 10 days. Pt remains critically ill on vent and CRRT. DCP will cont to follow.

## 2016-02-02 NOTE — Progress Notes (Signed)
Critical Care Progress Note:    Subjective:    The patient is a 12 year non smoking female with BMI 40 and?progressive dyspnea since June.   ??  8/11 Admitted to [facility]  with respiratory distress, pulmonary edema, NSTEMIC, anemia requiring 2 units PRBCs. ECHO showed severe aortic stenosis, EF 30%. Heart cath demonstrated no significant coronary occlusion. ?  ??  8/13 Transferred to [facility].   ??  8/17 Dr. Debroah Baller performed aortic valve replacement and right femoral IABP placement. Intraoperative EF was noted to be 15%. The patient was brought to the CCU post operatively. She required multiple vasopressors and inotropes and also received methylene blue. Nebulized flolan for pHTN.   ??  8/18 IABP was removed.   ??  8/19 Swan clotted and was removed. Levaquin started. Flolan stopped.   ??  8/20 Temp HD catheter was placed on and CRRT initiated. Bronchoscopy performed due to fever. Foley removed, new foley placed.   ??  8/21 Still intubated. Milrinone 0.375 mcg/kg/min, levophed at 10 mcg/min. CRRT. Due to ischemic appearing digits right radial aline removed, left radial aline placed. No purposeful response during sedation holiday. Enteral feeds started.   ?  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 [place] confirmed RLE DVT. Patient remained intubated on levophed, milrinone with CRRT running. She received IV amiodarone for afib. IV hydrocortisone started given persistence of circulatory shock (requiring 9 mcg/min levophed plus milrinone).    Today patient has awakened with sedation holiday. Spontaneous breathing trial is in progress. Milrinone has been weaned off, levophed is down to 3 mcg/min. ECHO done today, prelim report per tech indicates some improvement in EF.     Medications: Reviewed    Objective:    Physical Exam:    Vitals:    02/02/16 1054   BP:    Pulse: (!) 114   Resp: 24   Temp:    SpO2: 99%         Intake/Output Summary (Last 24 hours) at 02/02/16 1126  Last data filed at 02/02/16 1059   Gross per 24  hour   Intake           3040.5 ml   Output             3103 ml   Net            -62.5 ml       Weight: 113.2 kg (249 lb 9 oz)    Gen:  59 y.o.yr old  female supine, NAD  HEENT: Atraumatic  Neck: Supple  Chest: sternotomy  Lungs: clear to auscultation  Cardiac: s1 s2  Abdm: Soft, non-tender  GU: foley  Ext: Warm  Neuro: Awakens, moves all extremities, follows simple commands    Continuous Infusions:  . amiodarone in D5W 0.5 mg/min (02/02/16 0728)   . argatroban 0.5 mcg/kg/min (02/02/16 0727)   . clevidipine Stopped (02/02/16 0800)   . dexmedetomidine Stopped (02/02/16 1030)   . dextrose 10% (D10W)     . dextrose 5% lactated ringers Stopped (01/29/16 1829)   . DOBUTamine (DOBUTREX) 250 mg in D5W 250 mL (1000 mcg/mL) infusion     . fentanyl Stopped (02/01/16 0739)   . insulin (HUMAN R) 150 Units in NaCl 0.9 % 150 mL (1 unit/mL) infusion - cardiac surgery Stopped (01/29/16 1841)   . midazolam Stopped (01/31/16 0900)   . milrinone Stopped (02/02/16 0736)   . nitroglycerin     . norepinephrine 3 mcg/min (02/02/16 0744)   . phenylephrine (NEO-SYNEPHRINE) 40  mg in NaCl 0.9 % 500 mL (80 mcg/mL) infusion **DOUBLE CONCENTRATION** Stopped (01/31/16 0515)   . prismasate with additives 1,500 mL/hr at 02/02/16 0936   . prismasate with additives 500 mL/hr at 02/02/16 0846   . prismasate with additives 500 mL/hr at 02/02/16 0846   . vasopressin (PITRESSIN) 50 units in NaCl 0.9 % 250 mL (0.2 units/mL) infusion **ADULT SHOCK** Stopped (01/28/16 1711)       Meds:  . albuterol  2.5 mg Nebulization Q4H   . amiodarone  400 mg Oral Q12H   . aspirin  300 mg Rectal Daily   . chlorhexidine  15 mL Mouth/Throat Q12H   . famotidine  20 mg Intravenous Daily   . hydrocortisone sod succ (PF)  50 mg Intravenous Q6H   . levofloxacin (LEVAQUIN) IV  750 mg Intravenous Q24H   . phenylephrine       . phenylephrine       . sodium chloride 0.9 % (NS) syringe  10 mL Intravenous Q8H SCH   . sterile water (bottle)  30 mL Irrigation Q4H       Ventilator:      Vent Mode: CPAP  FiO2 (%):  [30] 30  S RR:  [20] 20  S VT:  [480 mL] 480 mL  PEEP/CPAP (cm H2O):  [5 cm H20] 5 cm H20  MAP (cm H2O):  [7-12] 7  O2 Device: Ventilator    LABS:  Recent Labs      02/01/16   0347  02/01/16   1008  02/02/16   0349   HGB  7.5*  7.7*  8.1*     Recent Labs      02/01/16   2213  02/02/16   0349  02/02/16   0930   NA  133*  133*  133*  135   K  4.1  4.2  4.2  4.1   CL  100  100  100  101   CO2  24.6  24.9  24.9  24.9   BUN  16  17  17  18    GLU  152*  173*  173*  188*     Recent Labs      01/31/16   0410  02/01/16   0347  02/02/16   0349   AST  39  37  34   ALT  4*  3*  5*   ALKPHOS  91  101  128*     Recent Labs      02/01/16   1008   INR  3.8*     Recent Labs      01/31/16   0427  02/01/16   0421  02/02/16   0337   PO2  123.3*  89.0  119.0*   PEEP  5.0  5.0  5.0       GLUCOSE:  Recent Labs      02/01/16   1258  02/01/16   1548  02/01/16   2150  02/02/16   0353  02/02/16   0930   POCGLU  120*  140*  150*  173*  176*       Imaging:   X-ray Chest Pa Or Ap    Result Date: 02/02/2016  CHEST 1 VIEW 02/02/2016 5:00 AM PROVIDED CLINICAL INDICATIONS:  Assess tube and line placement - post-op cardiac surgery    ADDITIONAL CLINICAL HISTORY:  None. COMPARISON:  02/01/2016 TECHNIQUE:  Single portable supine radiograph of the chest FINDINGS:  The endotracheal tube  tip terminates at the level of the clavicular heads. A nasogastric tube traverses below the diaphragm with the tip not included on the single provided image. Bilateral pleural and mediastinal drain are unchanged in position. A LEFT internal jugular vein dual-lumen central venous catheter is again noted with the tip in the superior vena cava. A RIGHT internal jugular vein sheath with additional central venous catheter is also unchanged with the tip in superior vena cava. No new lines or tubes are identified. No significant pneumothorax. There is decreasing pulmonary congestion with increased aeration of the lung bases and decreasing LEFT  pleural effusion. Cardiomediastinal silhouette is enlarged, but unchanged.     IMPRESSION:  1. Decrease in pulmonary congestion and LEFT pleural effusion. 2. Lines and tubes as above described are unchanged.       Assessment    Active Hospital Problems    Diagnosis SNOMED CT(R) Date Noted   . Heparin induced thrombocytopenia (CMS/HCC) HEPARIN-INDUCED THROMBOCYTOPENIA 02/02/2016   . S/P AVR (aortic valve replacement) HISTORY OF AORTIC VALVE REPLACEMENT 01/27/2016   . Respiratory failure (CMS/HCC) RESPIRATORY FAILURE 01/23/2016   . Aortic stenosis, severe AORTIC VALVE STENOSIS 01/22/2016   . NSTEMI (non-ST elevated myocardial infarction) (CMS/HCC) ACUTE NON-ST SEGMENT ELEVATION MYOCARDIAL INFARCTION 01/21/2016   . Demand ischemia of myocardium (CMS/HCC) ACUTE ISCHEMIC HEART DISEASE 01/21/2016   . Iron deficiency anemia IRON DEFICIENCY ANEMIA 01/21/2016   . Obesity OBESITY 01/21/2016     IMPRESSION  ??  AVR 8/17 for severe symptomatic aortic stenosis  Intra-op EF 15%  Post op inotrope requirement, improving  Acute hypoxemic respiratory failure  Acute kidney injury  Paroxysmal afib  Pulmonary HTN  Thrombocytopenia, HIT positive, DVT proximal right lower extremity  Acute encephalopathy, resolved  Query pneumonia  Iron deficiency anemia on admission, suspected occult GI loss  ?  Plan:    Neurologic: Sedation holiday for neuro assessment.     Pulmonary: VAP preventive measures. Spontaneous breathing trial. May be ready for extubation soon.     Cardiac: Plan per CT surgery.     ID: Cultures remain negative. Levaquin day #5 of 7.      Renal: CRRT per nephrology. Foley for precise monitoring of urine output.     GI: Famotidine. Enteral feeds.     Endocrine: Hydrocortisone for refractory circulatory shock. This will be stopped once pressors are off. Insulin as needed to maintain euglycemia.     Heme: Agratroban for HIT.     Prophylaxis: HOB elevation, Peridex Mouthwash, argatroban, famotidine    Code Status: full    This  patient remains at very high risk of organ failure, deterioration, and/or death and continues to require critical care management. I reviewed the case with the critical care nursing and respiratory staff.     Critical Care Time, excluding billable procedures: 39   mins    Casimiro Needle Jianni Batten,MD

## 2016-02-02 NOTE — Progress Notes (Signed)
RESPIRATORY ASSESSMENT    SUBJECTIVE: The patient remains intubated on mechanical ventilation.       OBJECTIVE:   Current Vital Signs:    Pre and post C/S: clear/decreased.    HR:  109   RR:  25              Cough: productive of moderate thin clear secretions.    SpO2: 96%     ABG Results: Ph:7.48   , CO2: 33.9    ,PO2:113.5     , HCO3: 25  Vent/Bipap Settings: PRVC: 480, 20, 30%, +5   CXR: FINDINGS:  The endotracheal tube tip terminates at the level of the clavicular heads. A nasogastric tube traverses below the diaphragm with the tip not included on the single provided image. Bilateral pleural and mediastinal drain are unchanged in position. A LEFT internal jugular vein dual-lumen central venous catheter is again noted with the tip in the superior vena cava. A RIGHT internal jugular vein sheath with additional central venous catheter is also unchanged with the tip in superior vena cava. No new lines or tubes are identified. No significant pneumothorax. There is decreasing pulmonary congestion with increased aeration of the lung bases and decreasing LEFT pleural effusion. Cardiomediastinal silhouette is enlarged, but unchanged.  ?  IMPRESSION:    ?  1. Decrease in pulmonary congestion and LEFT pleural effusion.  2. Lines and tubes as above described are unchanged.    ASSESSMENT:  SBT today for 143 minutes, patient not fully awake and able to protect airway. Plan repeat SBT tomorrow.     PLAN: SBT tomorrow. ABG daily, titrate vent settings as needed. Q4 hr albuterol.      GOAL(S):  Medication:     Minimize or eliminate adventitious breath sounds  Maximize airflow      CMV/NIV:  Maintain a patent airway  Normalize and maintain adequate ABG values for patient clinical situation  Prevent or correct atelectasis   Minimize risk of VAP              Maintain Home CMV/NIV routine    Supplemental Oxygen:   Maintain SpO2 >   92%  Normalize and maintain an adequate SpO2/PaO2 appropriate for patient clinical situation    Per  PDP # 102 and MD orders    E.S.  Marko Plume, RRT

## 2016-02-02 NOTE — Progress Notes (Signed)
Daily Progress Note     Name: Brandy [name]  DOB: July 07, 1955 60 y.o.  MRN: [id number]  CSN: [id number]    Assessment & Plan:     Active Hospital Problems    Diagnosis SNOMED CT(R) Date Noted   . S/P AVR (aortic valve replacement) HISTORY OF AORTIC VALVE REPLACEMENT 01/27/2016   . Respiratory failure (CMS/HCC) RESPIRATORY FAILURE 01/23/2016   . Aortic stenosis, severe AORTIC VALVE STENOSIS 01/22/2016   . NSTEMI (non-ST elevated myocardial infarction) (CMS/HCC) ACUTE NON-ST SEGMENT ELEVATION MYOCARDIAL INFARCTION 01/21/2016   . Demand ischemia of myocardium (CMS/HCC) ACUTE ISCHEMIC HEART DISEASE 01/21/2016   . Iron deficiency anemia IRON DEFICIENCY ANEMIA 01/21/2016   . Obesity OBESITY 01/21/2016     --AKI: ATN secondary to cardiogenic shock and possible sepsis from nosocomial pneumonia.  CXR improved. Tolerating CRRT well.   --Pneumonia: On Levaquin, now afebrile.  --S/P AVR:  Complicated by cardiogenic shock.  Hemodynamics improving.  Weaning pressor support.  --A-fib with RVR: Started on amiodarone.   --Thromb0cytopenia: Heparin changed to Argatroban    Plan:   Continue CRRT    Change UF goal of -75 ml/hr as tolerated  ECHO today    Subjective:     Brandy [name] is a 60 y.o. female patient. Patient was admitted with critical AS s/p AVR complicated by cardiogenic shock.  Remains intubated on FiO2 at 30%.  On vasopressor support with Levophed.  CRRT running well. Heparin stopped due to thrombocytopenia on 8/22.  Now on agatroban. In A-fib with RVR with better rate control on amiodarone.  Still requiring pressor support with levophed.  I/0: negative -0.2 L.    Scheduled Medications:   . albuterol  2.5 mg Nebulization Q4H   . amiodarone  400 mg Oral Q12H   . aspirin  300 mg Rectal Daily   . chlorhexidine  15 mL Mouth/Throat Q12H   . famotidine  20 mg Intravenous Daily   . hydrocortisone sod succ (PF)  50 mg Intravenous Q6H   . levofloxacin (LEVAQUIN) IV  750 mg Intravenous Q24H   . phenylephrine       .  phenylephrine       . sodium chloride 0.9 % (NS) syringe  10 mL Intravenous Q8H SCH   . sterile water (bottle)  30 mL Irrigation Q4H     Infusions:   . amiodarone in D5W 0.5 mg/min (02/02/16 0728)   . argatroban 0.5 mcg/kg/min (02/02/16 0727)   . clevidipine Stopped (02/02/16 0800)   . dexmedetomidine 0.7 mcg/kg/hr (02/02/16 0729)   . dextrose 10% (D10W)     . dextrose 5% lactated ringers Stopped (01/29/16 1829)   . DOBUTamine (DOBUTREX) 250 mg in D5W 250 mL (1000 mcg/mL) infusion     . fentanyl Stopped (02/01/16 0739)   . insulin (HUMAN R) 150 Units in NaCl 0.9 % 150 mL (1 unit/mL) infusion - cardiac surgery Stopped (01/29/16 1841)   . midazolam Stopped (01/31/16 0900)   . milrinone Stopped (02/02/16 0736)   . nitroglycerin     . norepinephrine 3 mcg/min (02/02/16 0744)   . phenylephrine (NEO-SYNEPHRINE) 40 mg in NaCl 0.9 % 500 mL (80 mcg/mL) infusion **DOUBLE CONCENTRATION** Stopped (01/31/16 0515)   . prismasate with additives 1,500 mL/hr at 02/02/16 0541   . prismasate with additives 500 mL/hr at 02/01/16 2315   . prismasate with additives 500 mL/hr at 02/01/16 2312   . vasopressin (PITRESSIN) 50 units in NaCl 0.9 % 250 mL (0.2 units/mL) infusion **ADULT SHOCK** Stopped (01/28/16  1711)     PRN Medications: acetaminophen (TYLENOL) suppository, acetaminophen, amiodarone in D5W, calcium chloride IVPB, calcium gluconate IVPB, clevidipine, dextrose 5% lactated ringers, dextrose, DOBUTamine (DOBUTREX) 250 mg in D5W 250 mL (1000 mcg/mL) infusion, EPINEPHrine, fentanyl, hydrALAZINE, HYDROcodone-acetaminophen, insulin (HUMAN R) 150 Units in NaCl 0.9 % 150 mL (1 unit/mL) infusion - cardiac surgery, magnesium sulfate IVPB, magnesium sulfate IVPB, magnesium sulfate IVPB, meperidine (PF), midazolam, milrinone, morphine, nitroglycerin, norepinephrine, oxyCODONE, phenylephrine (NEO-SYNEPHRINE) 40 mg in NaCl 0.9 % 500 mL (80 mcg/mL) infusion **DOUBLE CONCENTRATION**, potassium chloride in NS infusion 20 mEq/250 mL, protamine,  sodium chloride, sodium chloride 0.9 % (NS) syringe, sodium chloride 0.9 % (NS) syringe, sodium phosphate IVPB, sterile water (bottle)    Objective:     Vital Signs:    Vital Sign Ranges for Last 24 Hours:  Temp  Min: 36.1 ?C (97 ?F)  Max: 36.4 ?C (97.5 ?F)  Pulse  Min: 110  Max: 149  Resp  Min: 20  Max: 39  SpO2  Min: 91 %  Max: 100 %    Most Recent Vitals  BP: 124/46  Pulse: 117  Resp: 22  Temp: 36.1 ?C (97 ?F)  SpO2: 100 % (08/21 1245-08/23 0815)    Intake/Ouput:    Intake/Output Summary (Last 24 hours) at 02/02/16 0832  Last data filed at 02/02/16 0759   Gross per 24 hour   Intake           2977.9 ml   Output             2999 ml   Net            -21.1 ml        Exam:  NAD, intubated  Coarse UA BS, CT in place  Irreg, tachy  Abd: Obese, nontender  Ext: No edema  Skin: No rash.    Recent Labs:  Results for orders placed or performed during the hospital encounter of 01/23/16 (from the past 24 hour(s))   Heparin Induced Thrombocytopenia Antibody -Next Routine    Collection Time: 02/01/16  8:56 AM   Result Value Ref Range    Heparin Induced Thrombocytopenia Antibody Positive (Crit) Negative   POCT Glucose -Next Routine    Collection Time: 02/01/16  8:58 AM   Result Value Ref Range    POC Glucose 111 (H) 80 - 99 mg/dL   Magnesium -B1Y    Collection Time: 02/01/16 10:08 AM   Result Value Ref Range    Magnesium 1.8 1.6 - 2.4 mg/dL   Ionized Calcium -N8G    Collection Time: 02/01/16 10:08 AM   Result Value Ref Range    Ionized Calcium 1.29 1.12 - 1.30 mmol/L   Renal Function Panel -Q6H    Collection Time: 02/01/16 10:08 AM   Result Value Ref Range    Albumin 2.3 (L) 3.5 - 5.0 g/dL    Calcium 8.9 8.6 - 95.6 mg/dL    Phosphorus 2.2 (L) 2.5 - 4.5 mg/dL    BUN 17 6 - 23 mg/dL    Creatinine 2.13 (H) 0.55 - 1.10 mg/dL    Sodium 086 578 - 469 mmol/L    Glucose 121 (H) 80 - 99 mg/dL    Potassium 3.8 3.5 - 5.1 mmol/L    Chloride 101 98 - 111 mmol/L    CO2 - Carbon Dioxide 24.5 21.0 - 31.0 mmol/L    Anion Gap 9.5 3.0 - 11.0 mmol/L     GFR Estimate 32 (L) >=60 mL/min/1.12m*2  GFR Additional Info     APTT -Q6H    Collection Time: 02/01/16 10:08 AM   Result Value Ref Range    APTT 56 (H) 23 - 36 Seconds   CBC without Differential -Next Routine    Collection Time: 02/01/16 10:08 AM   Result Value Ref Range    WBC 12.6 (H) 4.8 - 10.8 10*3/?L    RBC 3.46 (L) 4.20 - 5.40 10*6/?L    Hemoglobin 7.7 (L) 12.0 - 16.0 g/dL    HCT 96.2 (L) 95.2 - 48.0 %    MCV 72.1 (L) 81.0 - 99.0 fL    MCH 22.2 (L) 27.0 - 34.0 pg    MCHC 30.8 (L) 32.0 - 36.0 g/dL    RDW 84.1 (H) 32.4 - 14.5 %    Platelet Count 24 (L) 150 - 400 10*3/?L    MPV 7.9 7.4 - 10.4 fL   PT & PTT -Next Routine    Collection Time: 02/01/16 10:08 AM   Result Value Ref Range    Protime 43.5 (H) 10.0 - 13.0 Seconds    APTT 56 (H) 23 - 36 Seconds    INR 3.8 (H) 0.9 - 1.2   POCT Glucose -Next Routine    Collection Time: 02/01/16 12:58 PM   Result Value Ref Range    POC Glucose 120 (H) 80 - 99 mg/dL   Magnesium -M0N    Collection Time: 02/01/16  3:38 PM   Result Value Ref Range    Magnesium 1.9 1.6 - 2.4 mg/dL   Renal Function Panel -Q6H    Collection Time: 02/01/16  3:38 PM   Result Value Ref Range    Albumin 2.4 (L) 3.5 - 5.0 g/dL    Calcium 9.2 8.6 - 02.7 mg/dL    Phosphorus 2.2 (L) 2.5 - 4.5 mg/dL    BUN 16 6 - 23 mg/dL    Creatinine 2.53 (H) 0.55 - 1.10 mg/dL    Sodium 664 (L) 403 - 143 mmol/L    Glucose 144 (H) 80 - 99 mg/dL    Potassium 4.1 3.5 - 5.1 mmol/L    Chloride 101 98 - 111 mmol/L    CO2 - Carbon Dioxide 24.3 21.0 - 31.0 mmol/L    Anion Gap 6.7 3.0 - 11.0 mmol/L    GFR Estimate 36 (L) >=60 mL/min/1.81m*2    GFR Additional Info     Ionized Calcium -Q6H    Collection Time: 02/01/16  3:39 PM   Result Value Ref Range    Ionized Calcium 1.31 (H) 1.12 - 1.30 mmol/L   APTT -Q6H    Collection Time: 02/01/16  3:39 PM   Result Value Ref Range    APTT 71 (H) 23 - 36 Seconds   POCT Glucose -Next Routine    Collection Time: 02/01/16  3:48 PM   Result Value Ref Range    POC Glucose 140 (H) 80 - 99 mg/dL    Ionized Calcium -K7Q    Collection Time: 02/01/16  9:45 PM   Result Value Ref Range    Ionized Calcium 1.31 (H) 1.12 - 1.30 mmol/L   APTT -Q6H    Collection Time: 02/01/16  9:45 PM   Result Value Ref Range    APTT 74 (H) 23 - 36 Seconds   POCT Glucose -Next Routine    Collection Time: 02/01/16  9:50 PM   Result Value Ref Range    POC Glucose 150 (H) 80 - 99 mg/dL   Magnesium -Q5Z  Collection Time: 02/01/16 10:13 PM   Result Value Ref Range    Magnesium 1.9 1.6 - 2.4 mg/dL   Renal Function Panel -Q6H    Collection Time: 02/01/16 10:13 PM   Result Value Ref Range    Albumin 2.4 (L) 3.5 - 5.0 g/dL    Calcium 9.2 8.6 - 59.5 mg/dL    Phosphorus 2.3 (L) 2.5 - 4.5 mg/dL    BUN 16 6 - 23 mg/dL    Creatinine 6.38 (H) 0.55 - 1.10 mg/dL    Sodium 756 (L) 433 - 143 mmol/L    Glucose 152 (H) 80 - 99 mg/dL    Potassium 4.1 3.5 - 5.1 mmol/L    Chloride 100 98 - 111 mmol/L    CO2 - Carbon Dioxide 24.6 21.0 - 31.0 mmol/L    Anion Gap 8.4 3.0 - 11.0 mmol/L    GFR Estimate 39 (L) >=60 mL/min/1.58m*2    GFR Additional Info     Blood Gas-Arterial -Daily    Collection Time: 02/02/16  3:37 AM   Result Value Ref Range    ABG PH (ASA) 7.481 (H) 7.350 - 7.450    PCO2 Arterial Blood Gas 36.4 35.0 - 45.0 mmHg    pO2 Arterial Blood Gas 119.0 (H) 80.0 - 100.0 mmHg    HCO3 Arterial Blood Gas 26.6 (H) 22.0 - 26.0 mmol/L    Base Excess 3.0 (H) -2.0 - 2.0 mmol/L    tHb 8.9 (L) 12.0 - 16.0 g/dL    I9JJ 88.4 (H) 16.6 - 97.0 %    Carboxyhemoglobin 0.5 0.0 - 1.5 %    Methemoglobin 0.2 0.0 - 1.0 %    O2 Saturation 98.5 92.0 - 98.5 %    a/A Ratio 0.74     ABG AA GRADIENT (ASA) 42.2 mmHg    Patient Temperature 37.0 C    Allen's Test NA     Sample Type Blood     Specimen Site AL     O2 Delivery Device Ventilator     FIO2 30     Ventilator Mode PRVC     Tidal Volume 480.0 mL    Respiratory Rate 20.0 b/min    Peep 5.0 cmH2O    Drawn By sd     Puncture Attempts 1     Time Analyzed [id number]     Analyzed By [facility]    Comprehensive Metabolic  Panel -Daily    Collection Time: 02/02/16  3:49 AM   Result Value Ref Range    Sodium 133 (L) 135 - 143 mmol/L    Potassium 4.2 3.5 - 5.1 mmol/L    Chloride 100 98 - 111 mmol/L    CO2 - Carbon Dioxide 24.9 21.0 - 31.0 mmol/L    Glucose 173 (H) 80 - 99 mg/dL    BUN 17 6 - 23 mg/dL    Creatinine 5.73 (H) 0.55 - 1.10 mg/dL    Calcium 9.2 8.6 - 22.0 mg/dL    AST - Aspartate Aminotransferase 34 8 - 39 IU/L    ALT - Alanine Amino transferase 5 (L) 7 - 52 IU/L    Alkaline Phosphatase 128 (H) 34 - 104 IU/L    Bilirubin Total 1.2 0.3 - 1.2 mg/dL    Protein Total 4.9 (L) 6.0 - 8.0 g/dL    Albumin 2.3 (L) 3.5 - 5.0 g/dL    Globulin 2.6 2.2 - 3.7 g/dL    Albumin/Globulin Ratio 0.9 (L) >0.9    Anion Gap 8.1 3.0 -  11.0 mmol/L    GFR Estimate 39 (L) >=60 mL/min/1.79m*2    GFR Additional Info     Phosphorus -Daily    Collection Time: 02/02/16  3:49 AM   Result Value Ref Range    Phosphorus 2.4 (L) 2.5 - 4.5 mg/dL   Magnesium -Daily    Collection Time: 02/02/16  3:49 AM   Result Value Ref Range    Magnesium 1.9 1.6 - 2.4 mg/dL   Ionized Calcium -Daily    Collection Time: 02/02/16  3:49 AM   Result Value Ref Range    Ionized Calcium 1.35 (H) 1.12 - 1.30 mmol/L   Renal Function Panel -Q6H    Collection Time: 02/02/16  3:49 AM   Result Value Ref Range    Albumin 2.3 (L) 3.5 - 5.0 g/dL    Calcium 9.2 8.6 - 16.1 mg/dL    Phosphorus 2.4 (L) 2.5 - 4.5 mg/dL    BUN 17 6 - 23 mg/dL    Creatinine 0.96 (H) 0.55 - 1.10 mg/dL    Sodium 045 (L) 409 - 143 mmol/L    Glucose 173 (H) 80 - 99 mg/dL    Potassium 4.2 3.5 - 5.1 mmol/L    Chloride 100 98 - 111 mmol/L    CO2 - Carbon Dioxide 24.9 21.0 - 31.0 mmol/L    Anion Gap 8.1 3.0 - 11.0 mmol/L    GFR Estimate 39 (L) >=60 mL/min/1.51m*2    GFR Additional Info     APTT -Q6H    Collection Time: 02/02/16  3:49 AM   Result Value Ref Range    APTT 73 (H) 23 - 36 Seconds   CBC with Auto Differential -Daily    Collection Time: 02/02/16  3:49 AM   Result Value Ref Range    WBC 16.2 (H) 4.8 - 10.8 10*3/?L     RBC 3.60 (L) 4.20 - 5.40 10*6/?L    Hemoglobin 8.1 (L) 12.0 - 16.0 g/dL    HCT 81.1 (L) 91.4 - 48.0 %    MCV 71.8 (L) 81.0 - 99.0 fL    MCH 22.4 (L) 27.0 - 34.0 pg    MCHC 31.2 (L) 32.0 - 36.0 g/dL    RDW 78.2 (H) 95.6 - 14.5 %    Platelet Count 32 (L) 150 - 400 10*3/?L    MPV 9.0 7.4 - 10.4 fL    Neutrophils % 80 (H) 35 - 70 %    Lymphocytes % 7 (L) 25 - 45 %    Monocytes % 13 (H) 0 - 12 %    Eosinophils % 0 0 - 7 %    Basophils % 0 0 - 2 %    Neutrophils, Absolute 13.0 (H) 1.6 - 7.3 10*3/?L    Lymphocytes, Absolute 1.1 1.1 - 4.3 10*3/?L    Monocytes, Absolute 2.1 (H) 0.0 - 1.2 10*3/?L    Eosinophils, Absolute 0.0 0.0 - 0.7 10*3/?L    Basophils, Absolute 0.0 0.0 - 0.2 10*3/?L    Differential Type Manual Differential     Platelet Estimate Decreased (A) Normal    Anisocytosis 3+ (A) (none)    Hypochromasia 1+ (A) (none)    Microcytes 1+ (A) (none)   POCT Glucose -Next Routine    Collection Time: 02/02/16  3:53 AM   Result Value Ref Range    POC Glucose 173 (H) 80 - 99 mg/dL     Pending Labs     Order Current Status    Blood Gas-Arterial -See Comments for  Frequency In process    Extra Tubes (ASA) -Next Routine In process    Red Top -Once In process    Blood Culture -x 2 Preliminary result    Blood Culture -x 2 Preliminary result    Respiratory culture -Next Routine Preliminary result        Recent Imaging:  No results found.     LOS: 10 days     Elby Showers  02/02/2016  8:32 AM

## 2016-02-03 ENCOUNTER — Inpatient Hospital Stay: Admit: 2016-02-03 | Payer: MEDICAID

## 2016-02-03 ENCOUNTER — Inpatient Hospital Stay: Admit: 2016-02-04 | Payer: MEDICAID

## 2016-02-03 LAB — CBC WITH AUTO DIFFERENTIAL
Basophils %: 0 % (ref 0–2)
Basophils, Absolute: 0 10*3/ÂµL (ref 0.0–0.2)
Eosinophils %: 1 % (ref 0–7)
Eosinophils, Absolute: 0.2 10*3/ÂµL (ref 0.0–0.7)
HCT: 27.7 % — ABNORMAL LOW (ref 37.0–48.0)
Hemoglobin: 8.6 g/dL — ABNORMAL LOW (ref 12.0–16.0)
Lymphocytes %: 5 % — ABNORMAL LOW (ref 25–45)
Lymphocytes, Absolute: 0.9 10*3/ÂµL — ABNORMAL LOW (ref 1.1–4.3)
MCH: 22.2 pg — ABNORMAL LOW (ref 27.0–34.0)
MCHC: 30.9 g/dL — ABNORMAL LOW (ref 32.0–36.0)
MCV: 71.8 fL — ABNORMAL LOW (ref 81.0–99.0)
MPV: 8.3 fL (ref 7.4–10.4)
Monocytes %: 6 % (ref 0–12)
Monocytes, Absolute: 1.1 10*3/ÂµL (ref 0.0–1.2)
Neutrophils %: 88 % — ABNORMAL HIGH (ref 35–70)
Neutrophils, Absolute: 15.6 10*3/ÂµL — ABNORMAL HIGH (ref 1.6–7.3)
Nucleated Red Blood Cells %: 1 % — ABNORMAL HIGH (ref <1.0)
Platelet Count: 31 10*3/ÂµL — ABNORMAL LOW (ref 150–400)
Platelet Estimate: DECREASED — AB
RBC: 3.85 10*6/ÂµL — ABNORMAL LOW (ref 4.20–5.40)
RDW: 25.5 % — ABNORMAL HIGH (ref 11.5–14.5)
WBC: 17.7 10*3/ÂµL — ABNORMAL HIGH (ref 4.8–10.8)

## 2016-02-03 LAB — ABG
A-a Gradient: 20.9 mmHg
Base Excess: 2.5 mmol/L — ABNORMAL HIGH (ref ?–2.0)
Carboxyhemoglobin: 0.2 % (ref 0.0–1.5)
FIO2: 30
HCO3 Arterial: 26.1 mmol/L — ABNORMAL HIGH (ref 22.0–26.0)
Hemoglobin: 9.8 g/dL — ABNORMAL LOW (ref 12.0–16.0)
Methemoglobin: 0.1 % (ref 0.0–1.0)
Minute Ventilation: 11.4
O2Hb: 98.8 % — ABNORMAL HIGH (ref 94.0–97.0)
Patient Temperature: 37 C
Peep: 5 cmH2O
Puncture Attempts: 1
Respiratory Rate: 29 b/min
Tidal Volume: 600 mL
Time Analyzed: 20170824035020
a/A Ratio: 0.87
pCO2 Arterial: 36.4 mmHg (ref 35.0–45.0)
pH Arterial: 7.473 — ABNORMAL HIGH (ref 7.350–7.450)
pO2 Arterial: 140.3 mmHg — ABNORMAL HIGH (ref 80.0–100.0)
sO2 Arterial: 99.1 % — ABNORMAL HIGH (ref 92.0–98.5)

## 2016-02-03 LAB — COMPREHENSIVE METABOLIC PANEL
ALT - Alanine Aminotransferase: 8 IU/L (ref 7–52)
AST - Aspartate Aminotransferase: 41 IU/L — ABNORMAL HIGH (ref 8–39)
Albumin/Globulin Ratio: 0.9 — ABNORMAL LOW (ref >0.9)
Albumin: 2.5 g/dL — ABNORMAL LOW (ref 3.5–5.0)
Alkaline Phosphatase: 135 IU/L — ABNORMAL HIGH (ref 34–104)
Anion Gap: 7 mmol/L (ref 3.0–11.0)
BUN: 23 mg/dL (ref 6–23)
Bilirubin Total: 1 mg/dL (ref 0.3–1.2)
CO2 - Carbon Dioxide: 25 mmol/L (ref 21.0–31.0)
Calcium: 9.4 mg/dL (ref 8.6–10.3)
Chloride: 102 mmol/L (ref 98–111)
Creatinine: 1.14 mg/dL — ABNORMAL HIGH (ref 0.55–1.10)
GFR Estimate: 49 mL/min/{1.73_m2} — ABNORMAL LOW (ref >=60)
Globulin: 2.9 g/dL (ref 2.2–3.7)
Glucose: 166 mg/dL — ABNORMAL HIGH (ref 80–99)
Potassium: 3.9 mmol/L (ref 3.5–5.1)
Protein Total: 5.4 g/dL — ABNORMAL LOW (ref 6.0–8.0)
Sodium: 134 mmol/L — ABNORMAL LOW (ref 135–143)

## 2016-02-03 LAB — IONIZED CALCIUM
Ionized Calcium: 1.33 mmol/L — ABNORMAL HIGH (ref 1.12–1.30)
Ionized Calcium: 1.38 mmol/L — ABNORMAL HIGH (ref 1.12–1.30)
Ionized Calcium: 1.39 mmol/L — ABNORMAL HIGH (ref 1.12–1.30)

## 2016-02-03 LAB — POCT GLUCOSE
POC Glucose: 127 mg/dL — ABNORMAL HIGH (ref 80–99)
POC Glucose: 131 mg/dL — ABNORMAL HIGH (ref 80–99)
POC Glucose: 148 mg/dL — ABNORMAL HIGH (ref 80–99)
POC Glucose: 167 mg/dL — ABNORMAL HIGH (ref 80–99)
POC Glucose: 155 mg/dL — ABNORMAL HIGH (ref 80–99)
POC Glucose: 129 mg/dL — ABNORMAL HIGH (ref 80–99)
POC Glucose: 132 mg/dL — ABNORMAL HIGH (ref 80–99)
POC Glucose: 133 mg/dL — ABNORMAL HIGH (ref 80–99)

## 2016-02-03 LAB — RENAL FUNCTION PANEL
Albumin: 2.5 g/dL — ABNORMAL LOW (ref 3.5–5.0)
Albumin: 2.5 g/dL — ABNORMAL LOW (ref 3.5–5.0)
Albumin: 2.5 g/dL — ABNORMAL LOW (ref 3.5–5.0)
Anion Gap: 6.7 mmol/L (ref 3.0–11.0)
Anion Gap: 7 mmol/L (ref 3.0–11.0)
Anion Gap: 7.1 mmol/L (ref 3.0–11.0)
BUN: 22 mg/dL (ref 6–23)
BUN: 23 mg/dL (ref 6–23)
BUN: 26 mg/dL — ABNORMAL HIGH (ref 6–23)
CO2 - Carbon Dioxide: 24.3 mmol/L (ref 21.0–31.0)
CO2 - Carbon Dioxide: 25 mmol/L (ref 21.0–31.0)
CO2 - Carbon Dioxide: 24.9 mmol/L (ref 21.0–31.0)
Calcium: 9.2 mg/dL (ref 8.6–10.3)
Calcium: 9.4 mg/dL (ref 8.6–10.3)
Calcium: 9.5 mg/dL (ref 8.6–10.3)
Chloride: 102 mmol/L (ref 98–111)
Chloride: 102 mmol/L (ref 98–111)
Chloride: 102 mmol/L (ref 98–111)
Creatinine: 1.13 mg/dL — ABNORMAL HIGH (ref 0.55–1.10)
Creatinine: 1.14 mg/dL — ABNORMAL HIGH (ref 0.55–1.10)
Creatinine: 1.25 mg/dL — ABNORMAL HIGH (ref 0.55–1.10)
GFR Estimate: 49 mL/min/{1.73_m2} — ABNORMAL LOW (ref >=60)
GFR Estimate: 49 mL/min/{1.73_m2} — ABNORMAL LOW (ref >=60)
GFR Estimate: 44 mL/min/{1.73_m2} — ABNORMAL LOW (ref >=60)
Glucose: 131 mg/dL — ABNORMAL HIGH (ref 80–99)
Glucose: 166 mg/dL — ABNORMAL HIGH (ref 80–99)
Glucose: 148 mg/dL — ABNORMAL HIGH (ref 80–99)
Phosphorus: 2.5 mg/dL (ref 2.5–4.5)
Phosphorus: 2.8 mg/dL (ref 2.5–4.5)
Phosphorus: 2.8 mg/dL (ref 2.5–4.5)
Potassium: 3.9 mmol/L (ref 3.5–5.1)
Potassium: 3.9 mmol/L (ref 3.5–5.1)
Potassium: 4 mmol/L (ref 3.5–5.1)
Sodium: 133 mmol/L — ABNORMAL LOW (ref 135–143)
Sodium: 134 mmol/L — ABNORMAL LOW (ref 135–143)
Sodium: 134 mmol/L — ABNORMAL LOW (ref 135–143)

## 2016-02-03 LAB — APTT
APTT: 51 Seconds — ABNORMAL HIGH (ref 23–36)
APTT: 45 Seconds — ABNORMAL HIGH (ref 23–36)
APTT: 39 Seconds — ABNORMAL HIGH (ref 23–36)

## 2016-02-03 LAB — MAGNESIUM
Magnesium: 2 mg/dL (ref 1.6–2.4)
Magnesium: 1.9 mg/dL (ref 1.6–2.4)
Magnesium: 2.1 mg/dL (ref 1.6–2.4)

## 2016-02-03 LAB — PHOSPHORUS: Phosphorus: 2.8 mg/dL (ref 2.5–4.5)

## 2016-02-03 MED ORDER — sodium chloride 0.9 % (NS) syringe 5-10 mL
INTRAMUSCULAR | Status: DC | PRN
Start: 2016-02-03 — End: 2016-02-15
  Administered 2016-02-12: 19:00:00 via INTRAVENOUS

## 2016-02-03 MED ORDER — sodium chloride 0.9 % (NS) syringe 5-10 mL
Freq: Every day | INTRAMUSCULAR | Status: DC
Start: 2016-02-03 — End: 2016-02-15
  Administered 2016-02-03 – 2016-02-15 (×13): via INTRAVENOUS

## 2016-02-03 MED ORDER — anticoagulant citrate dextrose solution A (ACD) flush 3 mL
Status: DC | PRN
Start: 2016-02-03 — End: 2016-02-13
  Administered 2016-02-04: 13:00:00

## 2016-02-03 MED ORDER — propofol (DIPRIVAN) infusion 10 mg/mL
10 | INTRAVENOUS | Status: DC | PRN
Start: 2016-02-03 — End: 2016-02-11
  Administered 2016-02-03 – 2016-02-05 (×15): 10 ug/kg/min via INTRAVENOUS

## 2016-02-03 MED FILL — SOLU-CORTEF ACT-O-VIAL (PF) 100 MG/2 ML SOLUTION FOR INJECTION: 100 | INTRAMUSCULAR | Qty: 2

## 2016-02-03 MED FILL — MORPHINE 4 MG/ML INTRAVENOUS CARTRIDGE: 4 mg/mL | INTRAVENOUS | Qty: 1

## 2016-02-03 MED FILL — FENTANYL (PF) IN 0.9 % NACL 1250 MCG/250 ML PREMIX INFUSION: 1250 mcg/250 mL (5 mcg/mL) | INTRAVENOUS | Qty: 250

## 2016-02-03 MED FILL — PROPOFOL 10 MG/ML INTRAVENOUS EMULSION: 10 mg/mL | INTRAVENOUS | Qty: 100

## 2016-02-03 MED FILL — NEXTERONE 360 MG/200 ML (1.8 MG/ML) INTRAVENOUS SOLUTION: 360 mg/200 mL (1.8 mg/mL) | INTRAVENOUS | Qty: 360

## 2016-02-03 MED FILL — DEXMEDETOMIDINE 100 MCG/ML INTRAVENOUS SOLUTION: 100 ug/mL | INTRAVENOUS | Qty: 4

## 2016-02-03 MED FILL — ARGATROBAN 50 MG/50 ML (1 MG/ML) IN SODIUM CHLORIDE (ISO-OSMOTIC) IV: 50 mg/50 mL (1 mg/mL) | INTRAVENOUS | Qty: 50

## 2016-02-03 MED FILL — MAGNESIUM SULFATE 1 GRAM/100 ML IN DEXTROSE 5 % INTRAVENOUS PIGGYBACK: 1 gram/100 mL | INTRAVENOUS | Qty: 100

## 2016-02-03 MED FILL — PRISMASOL BGK K (2 MEQ/L)-MG(1MEQ/L) HEMODIALYSIS SOLUTION: ARTERIOVENOUS_FISTULA | Qty: 5

## 2016-02-03 MED FILL — DEXMEDETOMIDINE 100 MCG/ML INTRAVENOUS SOLUTION: 100 ug/mL | INTRAVENOUS | Qty: 2

## 2016-02-03 MED FILL — FAMOTIDINE (PF) IN 0.9% NACL 20 MG/50 ML IVPB PREMIX: 20 mg/50 mL | INTRAVENOUS | Qty: 50

## 2016-02-03 MED FILL — MORPHINE 2 MG/ML INTRAVENOUS CARTRIDGE: 2 mg/mL | INTRAVENOUS | Qty: 2

## 2016-02-03 MED FILL — LEVOFLOXACIN IN D5W 750 MG/150 ML IVPB PREMIX: 750 mg/150 mL | INTRAVENOUS | Qty: 150

## 2016-02-03 NOTE — Progress Notes (Signed)
Critical Care Progress Note:    Subjective:    The patient is a 67 year non smoking female with BMI 40 and?progressive dyspnea since June.   ??  8/11 Admitted to [facility] ?with respiratory distress, pulmonary edema, NSTEMIC, anemia requiring 2 units PRBCs. ECHO showed severe aortic stenosis, EF 30%. Heart cath demonstrated no significant coronary occlusion. ?  ??  8/13 Transferred to [facility].   ??  8/17 Dr. Debroah Baller performed aortic valve replacement and right femoral IABP placement. Intraoperative EF was noted to be 15%. The patient was brought to the CCU post operatively. She required multiple vasopressors and inotropes and also received methylene blue. Nebulized flolan for pHTN.   ??  8/18 IABP was removed.   ??  8/19 Swan clotted and was removed. Levaquin started. Flolan stopped.   ??  8/20 Temp HD catheter was placed on and CRRT initiated. Bronchoscopy performed due to fever. Foley removed, new foley placed.   ??  8/21 Still?intubated. Milrinone 0.375 mcg/kg/min,?levophed at 10 mcg/min. CRRT. Due to ischemic appearing digits right radial aline removed, left radial aline placed. No purposeful response during sedation holiday. Enteral feeds started.   ??  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 [place] confirmed RLE DVT. Patient remained intubated on levophed, milrinone with CRRT running. She received IV amiodarone for afib. IV hydrocortisone started given persistence of circulatory shock (requiring 9 mcg/min levophed plus milrinone).  ?  8/23 Patient awakened with sedation holiday. Spontaneous breathing attempted, appeared weak for extubation.  Milrinone has been weaned off, levophed is down to 3 mcg/min. ECHO: EF30-35%, AV prosthesis OK, moderate mitral regurgitation, evidence of RV pressure or volume overload.  .     Today patient has awakened, followed commands. Left IJ dialysis catheter is not functioning. Plan is to hold argatroban, place PICC, pull right IJ TLC, place new right IJ temporary HD cath, pull  non-functioning left IJ temp HD cath, then resume argatroban.     Medications: Reviewed    Objective:    Physical Exam:    Vitals:    02/03/16 1130   BP:    Pulse: 100   Resp: (!) 29   Temp:    SpO2: 100%         Intake/Output Summary (Last 24 hours) at 02/03/16 1205  Last data filed at 02/03/16 0859   Gross per 24 hour   Intake             2875 ml   Output             4562 ml   Net            -1687 ml       Weight: 111.7 kg (246 lb 4.1 oz)    Gen:  59 y.o.yr old  female lying in bed in no acute distress.  HEENT: Atraumatic  Neck: right IJ TLC, left IJ HD cath  Chest: sternotomy  Lungs: clear to auscultation  Cardiac: s1 s2  Abdm: mild distension  Ext: Warm  Neuro: Awakens, follows simple commands, moves all extremities, generalized weakness.     Continuous Infusions:  . amiodarone in D5W 0.5 mg/min (02/03/16 0349)   . argatroban Stopped (02/03/16 1610)   . clevidipine Stopped (02/02/16 0800)   . dexmedetomidine 1.4 mcg/kg/hr (02/03/16 0944)   . dextrose 10% (D10W)     . dextrose 5% lactated ringers Stopped (01/29/16 1829)   . DOBUTamine (DOBUTREX) 250 mg in D5W 250 mL (1000 mcg/mL) infusion     .  fentanyl 75 mcg/hr (02/03/16 1102)   . insulin (HUMAN R) 150 Units in NaCl 0.9 % 150 mL (1 unit/mL) infusion - cardiac surgery Stopped (01/29/16 1841)   . midazolam Stopped (01/31/16 0900)   . milrinone Stopped (02/02/16 0736)   . nitroglycerin     . norepinephrine 2.5 mcg/min (02/03/16 1102)   . phenylephrine (NEO-SYNEPHRINE) 40 mg in NaCl 0.9 % 500 mL (80 mcg/mL) infusion **DOUBLE CONCENTRATION** Stopped (01/31/16 0515)   . prismasate with additives 1,500 mL/hr at 02/03/16 0713   . prismasate with additives 500 mL/hr at 02/03/16 0003   . prismasate with additives 500 mL/hr at 02/02/16 2349   . vasopressin (PITRESSIN) 50 units in NaCl 0.9 % 250 mL (0.2 units/mL) infusion **ADULT SHOCK** Stopped (01/28/16 1711)       Meds:  . albuterol  2.5 mg Nebulization Q4H   . amiodarone  400 mg Oral Q12H   . aspirin  300 mg Rectal  Daily   . chlorhexidine  15 mL Mouth/Throat Q12H   . famotidine  20 mg Intravenous Daily   . hydrocortisone sod succ (PF)  50 mg Intravenous Q6H   . levofloxacin (LEVAQUIN) IV  750 mg Intravenous Q24H   . phenylephrine       . phenylephrine       . sodium chloride 0.9 % (NS) syringe  10 mL Intravenous Q8H SCH   . sterile water (bottle)  30 mL Irrigation Q4H       Ventilator:   Vent Mode: PRVC/AC  FiO2 (%):  [30] 30  S RR:  [20] 20  S VT:  [480 mL] 480 mL  PEEP/CPAP (cm H2O):  [5 cm H20] 5 cm H20  MAP (cm H2O):  [7-10] 7  O2 Device: Ventilator    LABS:  Recent Labs      02/01/16   1008  02/02/16   0349  02/03/16   0336   HGB  7.7*  8.1*  8.6*     Recent Labs      02/02/16   2137  02/03/16   0336  02/03/16   0955   NA  133*  134*  134*  134*   K  3.9  3.9  3.9  4.0   CL  102  102  102  102   CO2  24.3  25.0  25.0  24.9   BUN  22  23  23   26*   GLU  131*  166*  166*  148*     Recent Labs      02/01/16   0347  02/02/16   0349  02/03/16   0336   AST  37  34  41*   ALT  3*  5*  8   ALKPHOS  101  128*  135*     Recent Labs      02/01/16   1008   INR  3.8*     Recent Labs      02/02/16   0337  02/02/16   1316  02/03/16   0344   PO2  119.0*  113.5*  140.3*   PEEP  5.0  5.0  5.0       GLUCOSE:  Recent Labs      02/02/16   1942  02/02/16   2346  02/03/16   0342  02/03/16   0810  02/03/16   1150   POCGLU  127*  131*  148*  167*  155*  Imaging:   X-ray Chest Pa Or Ap    Result Date: 02/03/2016  CHEST 1 VIEW 02/03/2016 5:08 AM PROVIDED CLINICAL INDICATIONS:  Assess tube and line placement - post-op cardiac surgery   ADDITIONAL CLINICAL HISTORY:  None. COMPARISON:  Similar exam performed the prior day. TECHNIQUE:  Single portable view of the chest. FINDINGS:  Sternotomy surgical changes and prosthetic cardiac valve. Multiple supportive lines and tubes appear unchanged. Cardiac silhouette may have decreased in size. Lungs are less inflated. No large pneumothorax. Patchy bilateral postoperative atelectasis and edema may be  minimally improved.     IMPRESSION:  1.  Recent postoperative changes with decreased aeration of the lungs and possible slight improvement in postoperative atelectasis and edema.       Assessment    Active Hospital Problems    Diagnosis SNOMED CT(R) Date Noted   . Heparin induced thrombocytopenia (CMS/HCC) HEPARIN-INDUCED THROMBOCYTOPENIA 02/02/2016   . S/P AVR (aortic valve replacement) HISTORY OF AORTIC VALVE REPLACEMENT 01/27/2016   . Respiratory failure (CMS/HCC) RESPIRATORY FAILURE 01/23/2016   . Aortic stenosis, severe AORTIC VALVE STENOSIS 01/22/2016   . NSTEMI (non-ST elevated myocardial infarction) (CMS/HCC) ACUTE NON-ST SEGMENT ELEVATION MYOCARDIAL INFARCTION 01/21/2016   . Demand ischemia of myocardium (CMS/HCC) ACUTE ISCHEMIC HEART DISEASE 01/21/2016   . Iron deficiency anemia IRON DEFICIENCY ANEMIA 01/21/2016   . 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    Plan:    Neurologic:Sedation holiday for neuro assessment. Physical therapy. Analgesia as required.     Pulmonary: VAP preventive measures. Spontaneous breathing trial.     Cardiac: Plan per CT surgery.     ID: Levaquin day #6 of 7.     Renal: Transition from CRRT to HD soon. New temporary HD cath.     GI: Enteral feeds. Famotidine.     Endocrine:  Stop hydrocortisone once circulatory shock is resolved. Maintain euglycemia.     Heme: H/H   8/72  Platelets 31. Argatroban for HIT, currently on hold for new lines.     Prophylaxis: HOB elevation, Peridex Mouthwash, famotidine, argatroban    Code Status: full    This patient remains at high risk of organ failure,  deterioration, and/or death and continues to require critical care management. I have reviewed the case with the critical care nursing and respiratory staff.     Critical Care Time, excluding billable procedures: 62  mins    Casimiro Needle Ivylynn Hoppes,MD

## 2016-02-03 NOTE — Progress Notes (Signed)
Daily Progress Note     Name: Brandy [name]  DOB: 10/19/1955 60 y.o.  MRN: [id number]  CSN: [id number]    Assessment & Plan:     Active Hospital Problems    Diagnosis SNOMED CT(R) Date Noted   . Heparin induced thrombocytopenia (CMS/HCC) HEPARIN-INDUCED THROMBOCYTOPENIA 02/02/2016   . S/P AVR (aortic valve replacement) HISTORY OF AORTIC VALVE REPLACEMENT 01/27/2016   . Respiratory failure (CMS/HCC) RESPIRATORY FAILURE 01/23/2016   . Aortic stenosis, severe AORTIC VALVE STENOSIS 01/22/2016   . NSTEMI (non-ST elevated myocardial infarction) (CMS/HCC) ACUTE NON-ST SEGMENT ELEVATION MYOCARDIAL INFARCTION 01/21/2016   . Demand ischemia of myocardium (CMS/HCC) ACUTE ISCHEMIC HEART DISEASE 01/21/2016   . Iron deficiency anemia IRON DEFICIENCY ANEMIA 01/21/2016   . Obesity OBESITY 01/21/2016     --AKI: ATN secondary to cardiogenic shock and possible sepsis from nosocomial pneumonia.  Tolerating CRRT well with argatroban. Volume status improving with UF.  Her dialysis catheter has been problematic with poor flow overnight.  Filter pressures rising today.    --Pneumonia: On Levaquin, now afebrile.  --S/P AVR:  Complicated by cardiogenic shock.  Hemodynamics improving.  Weaning pressor support.  --A-fib with RVR: Started on amiodarone.   --Thromobcytopenia    Plan:   Continue CRRT   UF goal of -75 ml/hr  Place new dialysis access today    Subjective:     Brandy [name] is a 60 y.o. female patient. Patient was admitted with critical AS s/p AVR complicated by cardiogenic shock.  CRRT was problematic last night due to high pressures in the dialysis access.  Filter pressures now climbing. Remains on amiodarone for A-fib.  On Argatroban. Still requiring pressor support with Levophed.  I/O negative 1.8 L.  Remains oliguric.    Scheduled Medications:   . albuterol  2.5 mg Nebulization Q4H   . amiodarone  400 mg Oral Q12H   . aspirin  300 mg Rectal Daily   . chlorhexidine  15 mL Mouth/Throat Q12H   . famotidine  20 mg  Intravenous Daily   . hydrocortisone sod succ (PF)  50 mg Intravenous Q6H   . levofloxacin (LEVAQUIN) IV  750 mg Intravenous Q24H   . phenylephrine       . phenylephrine       . sodium chloride 0.9 % (NS) syringe  10 mL Intravenous Q8H SCH   . sterile water (bottle)  30 mL Irrigation Q4H     Infusions:   . amiodarone in D5W 0.5 mg/min (02/03/16 0349)   . argatroban 0.5 mcg/kg/min (02/03/16 0740)   . clevidipine Stopped (02/02/16 0800)   . dexmedetomidine 1.4 mcg/kg/hr (02/03/16 9562)   . dextrose 10% (D10W)     . dextrose 5% lactated ringers Stopped (01/29/16 1829)   . DOBUTamine (DOBUTREX) 250 mg in D5W 250 mL (1000 mcg/mL) infusion     . fentanyl Stopped (02/01/16 0739)   . insulin (HUMAN R) 150 Units in NaCl 0.9 % 150 mL (1 unit/mL) infusion - cardiac surgery Stopped (01/29/16 1841)   . midazolam Stopped (01/31/16 0900)   . milrinone Stopped (02/02/16 0736)   . nitroglycerin     . norepinephrine 3 mcg/min (02/03/16 0330)   . phenylephrine (NEO-SYNEPHRINE) 40 mg in NaCl 0.9 % 500 mL (80 mcg/mL) infusion **DOUBLE CONCENTRATION** Stopped (01/31/16 0515)   . prismasate with additives 1,500 mL/hr at 02/03/16 0713   . prismasate with additives 500 mL/hr at 02/03/16 0003   . prismasate with additives 500 mL/hr at 02/02/16 2349   .  vasopressin (PITRESSIN) 50 units in NaCl 0.9 % 250 mL (0.2 units/mL) infusion **ADULT SHOCK** Stopped (01/28/16 1711)     PRN Medications: acetaminophen (TYLENOL) suppository, acetaminophen, amiodarone in D5W, calcium chloride IVPB, calcium gluconate IVPB, clevidipine, dextrose 5% lactated ringers, dextrose, DOBUTamine (DOBUTREX) 250 mg in D5W 250 mL (1000 mcg/mL) infusion, EPINEPHrine, fentanyl, hydrALAZINE, HYDROcodone-acetaminophen, insulin (HUMAN R) 150 Units in NaCl 0.9 % 150 mL (1 unit/mL) infusion - cardiac surgery, magnesium sulfate IVPB, magnesium sulfate IVPB, magnesium sulfate IVPB, meperidine (PF), midazolam, milrinone, morphine, nitroglycerin, norepinephrine, oxyCODONE,  phenylephrine (NEO-SYNEPHRINE) 40 mg in NaCl 0.9 % 500 mL (80 mcg/mL) infusion **DOUBLE CONCENTRATION**, potassium chloride in NS infusion 20 mEq/250 mL, protamine, sodium chloride, sodium chloride 0.9 % (NS) syringe, sodium chloride 0.9 % (NS) syringe, sodium phosphate IVPB, sterile water (bottle)    Objective:     Vital Signs:    Vital Sign Ranges for Last 24 Hours:  Temp  Min: 36 ?C (96.8 ?F)  Max: 37 ?C (98.6 ?F)  Pulse  Min: 89  Max: 130  Resp  Min: 7  Max: 43  SpO2  Min: 96 %  Max: 100 %    Most Recent Vitals  BP: 124/46  Pulse: 100  Resp: 21  Temp: 36 ?C (96.8 ?F)  SpO2: 100 % (08/21 1245-08/24 0745)    Intake/Ouput:    Intake/Output Summary (Last 24 hours) at 02/03/16 0801  Last data filed at 02/03/16 0749   Gross per 24 hour   Intake             3372 ml   Output             5182 ml   Net            -1810 ml        Exam:  NAD, intubated  Coarse UA BS, CT in place  Irreg, tachy  Abd: Obese, nontender  Ext: No edema  Skin: No rash.    Recent Labs:  Results for orders placed or performed during the hospital encounter of 01/23/16 (from the past 24 hour(s))   Magnesium -Q6H    Collection Time: 02/02/16  9:30 AM   Result Value Ref Range    Magnesium 1.8 1.6 - 2.4 mg/dL   Renal Function Panel -Q6H    Collection Time: 02/02/16  9:30 AM   Result Value Ref Range    Albumin 2.4 (L) 3.5 - 5.0 g/dL    Calcium 9.3 8.6 - 32.4 mg/dL    Phosphorus 2.0 (L) 2.5 - 4.5 mg/dL    BUN 18 6 - 23 mg/dL    Creatinine 4.01 (H) 0.55 - 1.10 mg/dL    Sodium 027 253 - 664 mmol/L    Glucose 188 (H) 80 - 99 mg/dL    Potassium 4.1 3.5 - 5.1 mmol/L    Chloride 101 98 - 111 mmol/L    CO2 - Carbon Dioxide 24.9 21.0 - 31.0 mmol/L    Anion Gap 9.1 3.0 - 11.0 mmol/L    GFR Estimate 45 (L) >=60 mL/min/1.50m*2    GFR Additional Info     POCT Glucose -Next Routine    Collection Time: 02/02/16  9:30 AM   Result Value Ref Range    POC Glucose 176 (H) 80 - 99 mg/dL   Ionized Calcium -Q0H    Collection Time: 02/02/16  9:31 AM   Result Value Ref Range     Ionized Calcium 1.37 (H) 1.12 - 1.30 mmol/L   Blood Gas-Arterial -  See Comments for Frequency    Collection Time: 02/02/16  1:16 PM   Result Value Ref Range    ABG PH (ASA) 7.485 (H) 7.350 - 7.450    PCO2 Arterial Blood Gas 33.9 (L) 35.0 - 45.0 mmHg    pO2 Arterial Blood Gas 113.5 (H) 80.0 - 100.0 mmHg    HCO3 Arterial Blood Gas 25.0 22.0 - 26.0 mmol/L    Base Excess 1.7 -2.0 - 2.0 mmol/L    tHb 9.2 (L) 12.0 - 16.0 g/dL    Z6XW 96.0 (H) 45.4 - 97.0 %    Carboxyhemoglobin 0.4 0.0 - 1.5 %    Methemoglobin 0.2 0.0 - 1.0 %    O2 Saturation 98.6 (H) 92.0 - 98.5 %    a/A Ratio 0.69     ABG AA GRADIENT (ASA) 50.7 mmHg    Patient Temperature 37.0 C    Allen's Test NA     Sample Type Blood     Specimen Site AL     O2 Delivery Device Ventilator     FIO2 30     Ventilator Mode PSV/CPAP     Minute Ventilation 15.5     Tidal Volume 560.0 mL    Respiratory Rate 30.0 b/min    Peep 5.0 cmH2O    PSV 5     Drawn By NURSE     Puncture Attempts NA     Time Analyzed [id number]     Analyzed By [facility] DAISS     Blood Gas Comment RSBI 55    POCT Glucose -Next Routine    Collection Time: 02/02/16  1:18 PM   Result Value Ref Range    POC Glucose 184 (H) 80 - 99 mg/dL   POCT Glucose -Next Routine    Collection Time: 02/02/16  4:13 PM   Result Value Ref Range    POC Glucose 127 (H) 80 - 99 mg/dL   Magnesium -O1H    Collection Time: 02/02/16  4:14 PM   Result Value Ref Range    Magnesium 1.9 1.6 - 2.4 mg/dL   Ionized Calcium -Y8M    Collection Time: 02/02/16  4:14 PM   Result Value Ref Range    Ionized Calcium 1.33 (H) 1.12 - 1.30 mmol/L   Renal Function Panel -Q6H    Collection Time: 02/02/16  4:14 PM   Result Value Ref Range    Albumin 2.5 (L) 3.5 - 5.0 g/dL    Calcium 9.3 8.6 - 57.8 mg/dL    Phosphorus 2.6 2.5 - 4.5 mg/dL    BUN 20 6 - 23 mg/dL    Creatinine 4.69 (H) 0.55 - 1.10 mg/dL    Sodium 629 (L) 528 - 143 mmol/L    Glucose 128 (H) 80 - 99 mg/dL    Potassium 3.9 3.5 - 5.1 mmol/L    Chloride 102 98 - 111 mmol/L    CO2 - Carbon Dioxide  24.8 21.0 - 31.0 mmol/L    Anion Gap 7.2 3.0 - 11.0 mmol/L    GFR Estimate 46 (L) >=60 mL/min/1.67m*2    GFR Additional Info     POCT Glucose -Next Routine    Collection Time: 02/02/16  7:42 PM   Result Value Ref Range    POC Glucose 127 (H) 80 - 99 mg/dL   Magnesium -U1L    Collection Time: 02/02/16  9:37 PM   Result Value Ref Range    Magnesium 2.0 1.6 - 2.4 mg/dL   Ionized Calcium -K4M  Collection Time: 02/02/16  9:37 PM   Result Value Ref Range    Ionized Calcium 1.33 (H) 1.12 - 1.30 mmol/L   Renal Function Panel -Q6H    Collection Time: 02/02/16  9:37 PM   Result Value Ref Range    Albumin 2.5 (L) 3.5 - 5.0 g/dL    Calcium 9.2 8.6 - 16.1 mg/dL    Phosphorus 2.5 2.5 - 4.5 mg/dL    BUN 22 6 - 23 mg/dL    Creatinine 0.96 (H) 0.55 - 1.10 mg/dL    Sodium 045 (L) 409 - 143 mmol/L    Glucose 131 (H) 80 - 99 mg/dL    Potassium 3.9 3.5 - 5.1 mmol/L    Chloride 102 98 - 111 mmol/L    CO2 - Carbon Dioxide 24.3 21.0 - 31.0 mmol/L    Anion Gap 6.7 3.0 - 11.0 mmol/L    GFR Estimate 49 (L) >=60 mL/min/1.88m*2    GFR Additional Info     POCT Glucose -Next Routine    Collection Time: 02/02/16 11:46 PM   Result Value Ref Range    POC Glucose 131 (H) 80 - 99 mg/dL   Comprehensive Metabolic Panel -Daily    Collection Time: 02/03/16  3:36 AM   Result Value Ref Range    Sodium 134 (L) 135 - 143 mmol/L    Potassium 3.9 3.5 - 5.1 mmol/L    Chloride 102 98 - 111 mmol/L    CO2 - Carbon Dioxide 25.0 21.0 - 31.0 mmol/L    Glucose 166 (H) 80 - 99 mg/dL    BUN 23 6 - 23 mg/dL    Creatinine 8.11 (H) 0.55 - 1.10 mg/dL    Calcium 9.4 8.6 - 91.4 mg/dL    AST - Aspartate Aminotransferase 41 (H) 8 - 39 IU/L    ALT - Alanine Amino transferase 8 7 - 52 IU/L    Alkaline Phosphatase 135 (H) 34 - 104 IU/L    Bilirubin Total 1.0 0.3 - 1.2 mg/dL    Protein Total 5.4 (L) 6.0 - 8.0 g/dL    Albumin 2.5 (L) 3.5 - 5.0 g/dL    Globulin 2.9 2.2 - 3.7 g/dL    Albumin/Globulin Ratio 0.9 (L) >0.9    Anion Gap 7.0 3.0 - 11.0 mmol/L    GFR Estimate 49 (L) >=60  mL/min/1.74m*2    GFR Additional Info     Phosphorus -Daily    Collection Time: 02/03/16  3:36 AM   Result Value Ref Range    Phosphorus 2.8 2.5 - 4.5 mg/dL   Magnesium -Daily    Collection Time: 02/03/16  3:36 AM   Result Value Ref Range    Magnesium 1.9 1.6 - 2.4 mg/dL   Ionized Calcium -Daily    Collection Time: 02/03/16  3:36 AM   Result Value Ref Range    Ionized Calcium 1.38 (H) 1.12 - 1.30 mmol/L   Renal Function Panel -Q6H    Collection Time: 02/03/16  3:36 AM   Result Value Ref Range    Albumin 2.5 (L) 3.5 - 5.0 g/dL    Calcium 9.4 8.6 - 78.2 mg/dL    Phosphorus 2.8 2.5 - 4.5 mg/dL    BUN 23 6 - 23 mg/dL    Creatinine 9.56 (H) 0.55 - 1.10 mg/dL    Sodium 213 (L) 086 - 143 mmol/L    Glucose 166 (H) 80 - 99 mg/dL    Potassium 3.9 3.5 - 5.1 mmol/L    Chloride 102  98 - 111 mmol/L    CO2 - Carbon Dioxide 25.0 21.0 - 31.0 mmol/L    Anion Gap 7.0 3.0 - 11.0 mmol/L    GFR Estimate 49 (L) >=60 mL/min/1.92m*2    GFR Additional Info     APTT -Daily    Collection Time: 02/03/16  3:36 AM   Result Value Ref Range    APTT 51 (H) 23 - 36 Seconds   CBC with Auto Differential -Daily    Collection Time: 02/03/16  3:36 AM   Result Value Ref Range    WBC 17.7 (H) 4.8 - 10.8 10*3/?L    RBC 3.85 (L) 4.20 - 5.40 10*6/?L    Hemoglobin 8.6 (L) 12.0 - 16.0 g/dL    HCT 16.1 (L) 09.6 - 48.0 %    MCV 71.8 (L) 81.0 - 99.0 fL    MCH 22.2 (L) 27.0 - 34.0 pg    MCHC 30.9 (L) 32.0 - 36.0 g/dL    RDW 04.5 (H) 40.9 - 14.5 %    Platelet Count 31 (L) 150 - 400 10*3/?L    MPV 8.3 7.4 - 10.4 fL    Neutrophils % 88 (H) 35 - 70 %    Lymphocytes % 5 (L) 25 - 45 %    Monocytes % 6 0 - 12 %    Eosinophils % 1 0 - 7 %    Basophils % 0 0 - 2 %    Nucleated Red Blood Cells % 1.0 (H) <1.0 %    Neutrophils, Absolute 15.6 (H) 1.6 - 7.3 10*3/?L    Lymphocytes, Absolute 0.9 (L) 1.1 - 4.3 10*3/?L    Monocytes, Absolute 1.1 0.0 - 1.2 10*3/?L    Eosinophils, Absolute 0.2 0.0 - 0.7 10*3/?L    Basophils, Absolute 0.0 0.0 - 0.2 10*3/?L    Differential Type Manual  Differential     Platelet Estimate Decreased (A) Normal    Anisocytosis 2+ (A) (none)    Hypochromasia 2+ (A) (none)    Ovalocytes 1+ (A) (none)    Polychromasia 1+ (A) (none)   POCT Glucose -Next Routine    Collection Time: 02/03/16  3:42 AM   Result Value Ref Range    POC Glucose 148 (H) 80 - 99 mg/dL   Blood Gas-Arterial -Daily    Collection Time: 02/03/16  3:44 AM   Result Value Ref Range    ABG PH (ASA) 7.473 (H) 7.350 - 7.450    PCO2 Arterial Blood Gas 36.4 35.0 - 45.0 mmHg    pO2 Arterial Blood Gas 140.3 (H) 80.0 - 100.0 mmHg    HCO3 Arterial Blood Gas 26.1 (H) 22.0 - 26.0 mmol/L    Base Excess 2.5 (H) -2.0 - 2.0 mmol/L    tHb 9.8 (L) 12.0 - 16.0 g/dL    W1XB 14.7 (H) 82.9 - 97.0 %    Carboxyhemoglobin 0.2 0.0 - 1.5 %    Methemoglobin 0.1 0.0 - 1.0 %    O2 Saturation 99.1 (H) 92.0 - 98.5 %    a/A Ratio 0.87     ABG AA GRADIENT (ASA) 20.9 mmHg    Patient Temperature 37.0 C    Allen's Test NA     Sample Type Blood     Specimen Site AL     O2 Delivery Device Ventilator     FIO2 30     Ventilator Mode PRVC     Minute Ventilation 11.4     Tidal Volume 600.0 mL    Respiratory Rate  29.0 b/min    Peep 5.0 cmH2O    Drawn By rn     Puncture Attempts 1     Time Analyzed [id number]     Analyzed By Hulda Marin      Pending Labs     Order Current Status    Blood Gas-Arterial -See Comments for Frequency In process    Extra Tubes (ASA) -Next Routine In process    Red Top -Once In process    Blood Culture -x 2 Preliminary result    Blood Culture -x 2 Preliminary result    Respiratory culture -Next Routine Preliminary result        Recent Imaging:  No results found.     LOS: 11 days     Elby Showers  02/03/2016  8:01 AM

## 2016-02-03 NOTE — Procedures (Signed)
Received orders from Dr. Stefano Gaul to have CVC replaced and set up new tube set. CCU RN requested this nurse come to discuss with intensivist d/t critical needs of patient. Discussed plan with CCU staff to get new CVC placed this afternoon. Confirmed above with Dr. Stefano Gaul.    Davita RN Consultation 30 minutes

## 2016-02-03 NOTE — Progress Notes (Signed)
^  F Triple Lumen Solo Power Picc inserted left upper arm basilic vein using ultrasound guidance. Tip of picc is at the level of the atrial caval junction. Belinda Fisher RN

## 2016-02-03 NOTE — Procedures (Signed)
PROCEDURE: Right internal jugular Trialysis Dialysis Catheter    PROCEDURE DONE BY: Valentina Gu    ATTENDING MD: Dr. Epimenio Sarin    DIAGNOSES:  1. Acute renal failure.  2. CRRT need.    INDICATIONS: Dialysis.      DESCRIPTION OF PROCEDURE:  The patient was placed in Trendelenburg. A time out was held. The right neck was cleansed with chlorhexidine. Maximal barrier precautions were placed. After the drape was placed the skin was again cleansed with chlorhexidine.The skin at the insertion site was numbed with 3 mL of 1% plain Lidocaine. Using sterile Seldinger technique with ultrasound guidance, the right internal jugular vein was cannulated with good return of dark red non-pulsatile blood. A guide wire was inserted and the needle withdrawn. A superficial skin nick was placed and two serial dilations performed. A 12.5 cm dialysis catheter was inserted and the wire withdrawn. All ports drew back blood and flushed easily. The line was sewn in place, the area re-cleansed with chlorhexidine, and a bio-patch and sterile dressing applied. The ports were NOT locked with any heparin and the patient was immediately connected to CRRT after CXR confirmation.     Estimated blood loss for the procedure was less than 5mL. There were no apparent complications in the immediate phase. Patient tolerated the procedure well. A STAT x-ray was reviewed.

## 2016-02-03 NOTE — Progress Notes (Signed)
Critical Care Interval Note    Hospital Day: 11    Current support:  Peptamen Intense VHP @ goal rate of 58mL/hr providing 1320 kcals, 121g protein, free water  Access: orogastric  Head of Bed: >30 degrees  Admit Anthropometric Measurements:   Ht: 167.6 cm (66)        Admit Wt: 101.4 kg (224 lbs)  IBW: 59.09 kg (130 lbs)                                                                %IBW: 172%  BMI: 36.1 (no edema)   Ht/wt status:obese class 2  ??  Nutrition-Focused Physical Findings:  Patient appears well-nourished and well-hydrated   Poor dentition per physician note   Nutrition risk factors: Current morbidity   Risk for nutritional deficit:high d/t current intubation   Estimated needs:Estimated needs:  11-14 kcals/kg actual wt or 1115 - 1420 kcals  2g protein/kg IBW or 118g protein (1.2g protein/kg actual wt)  Residuals: minimal  Bowel Protocol: not noted  Stooling: 8/23 and smear this morning  Labs: Results for Brandy [name], Brandy [name] (MRN [id number]) as of 02/03/2016 07:12   Ref. Range 02/03/2016 03:36   Sodium Latest Ref Range: 135 - 143 mmol/L 134 (L)   Potassium Latest Ref Range: 3.5 - 5.1 mmol/L 3.9   Chloride Latest Ref Range: 98 - 111 mmol/L 102   CO2 - Carbon Dioxide Latest Ref Range: 21.0 - 31.0 mmol/L 25.0   BUN Latest Ref Range: 6 - 23 mg/dL 23   Creatinine, Serum Latest Ref Range: 0.55 - 1.10 mg/dL 4.78 (H)   Glucose Latest Ref Range: 80 - 99 mg/dL 295 (H)   Results for Brandy [name], Brandy [name] (MRN [id number]) as of 02/03/2016 07:12   Ref. Range 02/03/2016 03:36   Phosphorus Latest Ref Range: 2.5 - 4.5 mg/dL 2.8     Potassium Protocol: not noted  Propofol:not noted  Cleviprex:not noted  Pressors:Levophed  Prokinetics:not noted  Coumadin:none  Nutrition Diagnosis:Inadequate protein-energy intake related to inability to consume nutrients via oral route as evidenced by mechanical ventilation and lack of nutrition support.   (Progress:  Progressing.)  Recommendations: None for change from our perspective  although, per discussion with nursing, plan for today may include discontinuation of CRRT and possible extubation.    P/G: follow closely for support to congruence with clinical course.

## 2016-02-03 NOTE — Treatment Summary (Signed)
IP PT TREATMENT NOTE  (PROM)     Patient:  [name] DOB:  1956-03-04 / 60 y.o. Date:  02/03/2016     Precautions  Specific mobility precautions: Sternal precautions  Sternal precautions: Standard  Other: CRRT L chest wall     ASSESSMENT     Summary:  Pt remains intubated and drowsy  She was cooperative, able to nod or blink her eyes in approval of therapy and activities  No facial expressions of discomfort noted  Frequent rest breaks required   Pt provided assistance occasionally with ROM activities  Completed AAROM/PROM all extremities with limited L shoulder motion due to CRRT catheter in L chest wall  Husband present, attentive and supportive throughout session     Activity tolerance: Tolerates 10 - 20 min activity with multiple rests  Comments: Currently intubated, on CRRT and limited activity tolerance       Discharge recommendations  Mobility Equipment needs: To be determined later   Primary recommendation : Too early to determine post-discharge recommendations.      PLAN     Treatment Plan Frequency     Plan: Continue per established POC.  Pt. demonstrates limitations which require skilled PT intervention.  1x (5 days/week).  Frequency will be increased as pt's condition or discharge plan indicates.     Gerilyn Stargell will remain on the Physical Therapy schedule until goals are met, or there has been a change in the Plan of Care.          Treatment Diagnosis Surgery / # Days Post-op (if applicable)    Primary Dx: Resp failure  Treatment Dx: altered mobility  Procedure(s) (LRB):  AORTIC VALVE REPLACEMENT; INTRA AORTIC BALLOON INSERTION (N/A) / 7 Days Post-Op       SUBJECTIVE   Reviewed chart and nursing approved supine AAROM/PROM activities at this time     Pain Level  Current status: Pain does not limit pt's ability to participate in therapy  Pain at start of session: 0 - No Pain  Pain at end of session: 0 - No Pain     OBJECTIVE   AAROM/PROM, all extremities (limited in L shoulder-see assessment)             Mental status  General Demeanor: Pleasant;Cooperative  LOC: Drowsy  Orientation: Oriented to:  Oriented to: Person  --> Follows one-step commands: Intermittently  Attention span: Has difficulty staying on task, even with cues     GOALS     Patient/Caregiver goals reviewed and integrated with rehab treatment plan:     Multidisciplinary Problems (Active)        Problem: Mobility: Ambulation    Goal Priority Disciplines Outcome   Gait (with precautions)     PT    Description:  Pt. to ambulate 50 feet with one person min assist, using FWW, following any pertinent precautions.    Gait Soil scientist)     PT    Description:               Problem: Mobility: Transfers    Goal Priority Disciplines Outcome   Supine to/from sitting (with precautions)     PT    Description:  Pt. to transfer to/from sitting with one person min assist,  following any pertinent precautions.    Sitting to/from standing (with precautions)     PT    Description:  Pt. to transfer sitting to/from standing with one person min assist, following any pertinent precautions.    Bed to/from  chair (with precautions)     PT    Description:  Pt. to transfer bed to/from chair with one person min assist, following any pertinent precautions.           Problem: Patient/Family Goal    Goal Priority Disciplines Outcome   Normal Status     PT    Description:  Pt. wants to return to previous level of function.     Mobility     PT    Description:  Patient wants to walk again.           Problem: Strength    Goal Priority Disciplines Outcome   Home Exercise Program (with precautions)     PT    Description:  Pt. to perform basic HEP independently following any pertinent precautions.                       Session times   Start/Stop times: 0940 to 1013   Total time: 33 minutes      This note to serve as a Discharge Summary if Chasta Deshpande is discharged from the hospital or from therapy services.     Therapist:  Rana Snare, PT

## 2016-02-03 NOTE — Progress Notes (Signed)
END OF SHIFT SUMMARY:      SENSORIUM:  Pt intubated wakes and follows commands  BS (and any changes with tx):  diminished  TREATMENTS (or changes in treatment): 2.5 mg albuterol Q4   SUCTIONING (amount, color, tenacity):  Small white/clear secretions  ABG: pH: 7.473   CO2: 36 PO2:140   HCO3:26  BE:  2.5 O2 Sat:99  XRAY:  Results pending for 8/24  VENTILATOR/BiPAP CHANGES: none   SBT/WEANING (settings, length of trial, toleration): n/a this shift   PROCEDURES:  none  TRANSPORTS:  none  ETT ADVANCED/RETRACTED/SECURE:  7.5 24 at lips  SKIN INTEGRITY:good/intact  GOALS/PLAN:  Daily sbt, daily and prn abgs  ADVERSE EVENTS:    COMMENTS:

## 2016-02-03 NOTE — Progress Notes (Signed)
PHARMACIST CRITICAL CARE PROGRESS NOTE     Consult Per P&T Protocol: Critical Care Physician Service, Antimicrobial Interventions, Renal Dosing, IV to PO         Summary: Khalilah LEONA GUINTHER is [name] admitted on 01/23/2016 with a NSTEMI s/p R/L heart craterization. PICC line placed 8/25.     Subjective: I visited the patient, but was unable to explain the pharmacist plan of care to the patient due to sedation and/or intubation.    Allergies: Heparin and Sulfa (sulfonamide antibiotics)    PMH:  has a past medical history of History of blood transfusion.     Surgical history:  has a past surgical history that includes Tonsillectomy; Tubal ligation; Tubal ligation (1980); and ocedure (N/A, 01/27/2016).    Active problems:  ? Aortic Valve replacement  ? Acute systolic heart failure  ? NSTEMI  ? 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 Diet, NPO       Height: 167.6 cm (66) Admit weight: 101.4 kg (223 lb 8.7 oz) Last charted weight: 111.7 kg (246 lb 4.1 oz) IBW: 59.3 kg Body mass index is 39.75 kg/(m^2).    I/O last 3 completed shifts:  In: 4711.4 [I.V.:2663.4; NG/GT:1728; IV Piggyback:320]  Out: 6483 [Urine:68; Other:5940; Chest Tube:475]    Recent Labs      02/01/16   0347   02/02/16   0349   02/03/16   0336  02/03/16   0955  02/03/16   1757   NA  134*   < >  133*  133*   < >  134*  134*  134*  134*   K  3.8   < >  4.2  4.2   < >  3.9  3.9  4.0  4.3   CL  102   < >  100  100   < >  102  102  102  103   CO2  24.7   < >  24.9  24.9   < >  25.0  25.0  24.9  23.7   GLU  145*   < >  173*  173*   < >  166*  166*  148*  152*   BUN  18   < >  17  17   < >  23  23  26*  30*   CREATININES  1.85*   < >  1.37*  1.37*   < >  1.14*  1.14*  1.25*  1.44*   CALCIUM  9.0   < >  9.2  9.2   < >  9.4  9.4  9.5  9.2   AST  37   --   34   --   41*   --    --    ALT  3*   --   5*   --   8   --    --    ALBUMIN  2.3*   < >  2.3*  2.3*   < >   2.5*  2.5*  2.5*  2.5*    < > = values in this interval not displayed.     Recent Labs      02/03/16   0336  02/03/16   0955  02/03/16   1757   CAION  1.38*  1.39*  1.35*  Recent Labs      02/03/16   0336  02/03/16   0955  02/03/16   1757   MG  1.9  2.1  2.0   PHOS  2.8  2.8  2.8  3.4     Recent Labs      02/03/16   0810  02/03/16   1150  02/03/16   1343  02/03/16   1424  02/03/16   1601  02/03/16   1948   POCGLU  167*  155*  129*  132*  133*  164*     Recent Labs      02/01/16   0347  02/01/16   1008  02/02/16   0349  02/03/16   0336   WHITEBLOODCE  13.5*  12.6*  16.2*  17.7*   NEUTROABS  10.9*   --   13.0*  15.6*   HGB  7.5*  7.7*  8.1*  8.6*   HCT  24.9*  25.0*  25.9*  27.7*   LABPLAT  25*  24*  32*  31*       Temp (24hrs), Avg:36.3 ?C (97.4 ?F), Min:36 ?C (96.8 ?F), Max:37 ?C (98.6 ?F)      Relevant Microbiology:  Date: Source: Results:  Status:   8/20 Bronc Wash  Viral/Fungal Viral panel negative; fungal in process  No bacterial growth (final) Preliminary   8/20 BAL NGTD Final   8/19 Blood x 4 NGTD Final   8/19 ET Asp NGTD Final     ASSESSMENT:     Estimated CrCl: CRRT (started 8/20 PM)    Critical Care Prophylaxis:  VAP: Peridex 0.12% 15 mL BID   VTE: PLT 31    Heparin products discontinued.  Argatroban infusion continues  Anti-embolism stockings   SUP: Pepcid 20 mg IV daily     CBG Monitoring:   ? CBG goal in critically ill: 140-180  ? CBG in past 24H: <180      Antimicrobial Monitoring:   Indication for antimicrobials: Sepsis of unknown origin (per Intensivist 8/19)   Current antimicrobials: Levaquin 750 mg IV Q24H (8/21-current)   Relevant previous antimicrobials: Levaquin 750 mg IV Q48H (8/19)   # of days of therapy directed at source of infection or pathogen: Day 6 on 8/24   Comments about duration? Dependent on source  General sepsis: 7-10 days (Surviving Sepsis Campaign)   Notes about antimicrobial therapy or microbiology data?        PLAN:     Critical Care Prophylaxis:  ? Prophylactic medications  ordered    ? Post op thrombocytopenia PLT 31, Heparin products discontinued.  Argatroban infusion started and currently increased to 0.7 mcg/Kg/min  ? Monitor for s/sx of bleeding    Antimicrobial Interventions:   ? Levaquin started empirically for sepsis. WBC are trending upward.   ? Will follow    Renal Dosing:   ? CRRT started 8/20 in PM  ? Levaquin at 750 mg IV Q24H CRRT dosing      IV to PO:   ? No switches at the moment     Argatroban infusion:  ? Using the Argatroban protocol, Heparin products were discontinued and heparin entered as an allergy.  ? This is a critically ill patient s/p surgery, aortic valve replacement, NSTEMI, On CRRT  ? Mrs. Horsey has a thrombosis in a lower extremity.  ? Argatroban at 0.66mcg/Kg/min (4.73ml/hr)  ? aPTT had dropped below goal (of 1.5 - 3 times baseline and not > 100 sec) and  argatroban dosage was titrated up.   ? Were warfarin to be considered- it may NOT be added to therapy until platelets return to 150K or greater and aPTT is stable.    Signed by: Drucie Ip, RPh

## 2016-02-03 NOTE — Procedures (Signed)
Went to CCU to assess current access to restart CRRT treatment. Found access to be sluggish in both lumens, inadequate to maintain CRRT tx. Discussed above with Nicholaus Corolla RN responsible for monitoring CRRT treatment. Britta Mccreedy will inform dialysis staff when new line is in place.    Will wait for new line placement. Dr. Stefano Gaul informed.    Davita RN Consultation 30 minutes

## 2016-02-03 NOTE — Progress Notes (Signed)
CRRT machine began alarming extremely high return pressure. Flushed line, no kinks in line. Tried releasing clamp. Still alarming extremely high pressures after multiple attempts at trouble shooting. Notified dialysis RN. Waiting for new trialysis line before restarting.

## 2016-02-03 NOTE — Progress Notes (Signed)
CARDIOTHORACIC SURGERY CRITICAL CARE NOTE     Name: Brandy [name]  DOB: 01/22/1956 60 y.o.  MRN: [id number]  CSN: [id number]    Brandy [name] is a 60 y.o. female patient. Patient was admitted with Respiratory failure (CMS/HCC)  Who had Procedure(s):  AORTIC VALVE REPLACEMENT; INTRA AORTIC BALLOON INSERTION.  7 Days Post-Op    HPI: The patient is a 60 y.o. female with the below medical co-morbid conditions.    ?  1. Severe symptomatic aortic stenosis                        Ejection fraction 30%     Found on TEE intra-op to be 15%                        Normal coronaries                        Predominant symptom dyspnea                     2. Severe Pulmonary Hypertension  3. Mild to Moderate Mitral regurgitation  4. Morbid obesity                        Weight loss 50 pounds in the last one year  5. respiratory insufficiency  6. Non-ST elevation MI                        The demand ischemia  7. Iron deficiency anemia                        CT chest abdomen and pelvis reviewed no abdominal masses                        Likely AVM secondary to aortic stenosis not a good candidate for sedation and EGD or colonoscopy well aortic stenosis is so critical  ?  Patient's a 60 year old female who presented the [facility] falls in respiratory distress unable to breathe.  There are her troponin was elevated she underwent oxygen therapy and was given Lasix.  Cardiac workup showed normal coronary arteries with depressed LV function on echo and severe aortic stenosis valve area less than 0.4 velocity 4.4 m/s with an EF 30%.  She was transfused 2 units of PRBCs A [facility] hospital with a hemoglobin 8.8.  His immobility volume overload from heart failure  ?  Patient hasn't been to the doctor in years she hasn't taken any medications nor does she take good care of herself.  [place] she is unable to consistently take meds.  She likes to use and asked to [employer] wood to heat her house.    ?  After a few  days of Lasix patient states that she was breathing much better she is 60% better per her suggestion.  I had a long talk with the family about the disease course why she was getting short of breath and discussion of heart failure.  They understand that she is given a need aortic valve replacement she wishes to have tissue valve I do think she is a better surgical candidate and have her given her age and risk.  ?  She has very poor dentition    POD #1  Patient has  been stable on balloon 1:2   Methylene blue last night SVR much better   Coronary perfusion good   Today I am managing acute heart failure with management of the IABP.    POD #2  removal of the intra-aortic balloon pump renal ultrasound completed showing minimal flow to the kidneys  patient still making urine  POD #3 CRRT started  POD #4 CRRT running acid base more stable pressures much better off NEO and LEVOPHED improved making urine  POD#5 CRRT continues   Patient plt 25   Will check for DVT   Postoperative day #6 CRRT continues platelets now up to 32 treating for heparin-induced from cytopenia  Postoperative day #7 Trialysis Cath replaced secondary to malfunction.  On agatroban still hemodynamics improving oxygen requirements lower.    Subjective: making progress    Assessment & Plan:     Active Hospital Problems    Diagnosis SNOMED CT(R) Date Noted   . Heparin induced thrombocytopenia (CMS/HCC) HEPARIN-INDUCED THROMBOCYTOPENIA 02/02/2016   . S/P AVR (aortic valve replacement) HISTORY OF AORTIC VALVE REPLACEMENT 01/27/2016   . Respiratory failure (CMS/HCC) RESPIRATORY FAILURE 01/23/2016   . Aortic stenosis, severe AORTIC VALVE STENOSIS 01/22/2016   . NSTEMI (non-ST elevated myocardial infarction) (CMS/HCC) ACUTE NON-ST SEGMENT ELEVATION MYOCARDIAL INFARCTION 01/21/2016   . Demand ischemia of myocardium (CMS/HCC) ACUTE ISCHEMIC HEART DISEASE 01/21/2016   . Iron deficiency anemia IRON DEFICIENCY ANEMIA 01/21/2016   . Obesity OBESITY 01/21/2016          Neurologic: No issues sedated     Cardiac: Pre-op EF:   EF 15% prior to surgery   Post op 30% with good valve functdion 23mm valve     1. Inotropes turning off milrinone today   2. Pressors: Levophed at 2         3. Rhythm: AFIB rates better controlled on amiodarone   4. Aspirin: 300 rectal   5. Beta-Blocker: hold   6. Statin: hold   7. ACE/ARB: hold        Respiratory:    Smoker none   Pulmonary toilet    Xray: improved       Gastrointestinal:    Genitourinary: creat: pre op 0.9   Now 1.16 >2.6 up to 3.4>3.26>1.85>1.37>1.25  ON CRRT     Infectious Disease:  WBC: 17>18> 19.4>16.8>13.5>17.7    Hematology: HGB 9.8>8.6>7.7>7.5>8.6   PLTS 180> 91>64>32>25>32>31  Checked hitt  ++++   Stopped all heparin and started agatroban  Endocrine: Glucose management   Electrolytes: replacing Mg/K/Ca  Prophylaxis: Heparin PPI/H2 Blocker     Scheduled Medications:   . albuterol  2.5 mg Nebulization Q4H   . amiodarone  400 mg Oral Q12H   . aspirin  300 mg Rectal Daily   . chlorhexidine  15 mL Mouth/Throat Q12H   . famotidine  20 mg Intravenous Daily   . hydrocortisone sod succ (PF)  50 mg Intravenous Q6H   . levofloxacin (LEVAQUIN) IV  750 mg Intravenous Q24H   . phenylephrine       . phenylephrine       . sodium chloride 0.9 % (NS) syringe  10 mL Intravenous Q8H SCH   . sodium chloride 0.9 % (NS) syringe  5-10 mL Intravenous Daily   . sterile water (bottle)  30 mL Irrigation Q4H     Infusions:   . amiodarone in D5W 0.5 mg/min (02/03/16 1437)   . argatroban 0.5 mcg/kg/min (02/03/16 1605)   . clevidipine Stopped (02/02/16 0800)   .  dexmedetomidine 1.4 mcg/kg/hr (02/03/16 1520)   . dextrose 10% (D10W)     . dextrose 5% lactated ringers Stopped (01/29/16 1829)   . DOBUTamine (DOBUTREX) 250 mg in D5W 250 mL (1000 mcg/mL) infusion     . fentanyl 100 mcg/hr (02/03/16 1558)   . insulin (HUMAN R) 150 Units in NaCl 0.9 % 150 mL (1 unit/mL) infusion - cardiac surgery Stopped (01/29/16 1841)   . midazolam Stopped (01/31/16 0900)   .  milrinone Stopped (02/02/16 0736)   . nitroglycerin     . norepinephrine 3 mcg/min (02/03/16 1225)   . phenylephrine (NEO-SYNEPHRINE) 40 mg in NaCl 0.9 % 500 mL (80 mcg/mL) infusion **DOUBLE CONCENTRATION** Stopped (01/31/16 0515)   . prismasate with additives 1,500 mL/hr at 02/03/16 1549   . prismasate with additives 500 mL/hr at 02/03/16 1549   . prismasate with additives 500 mL/hr at 02/03/16 1549   . propofol 15 mcg/kg/min (02/03/16 1722)   . vasopressin (PITRESSIN) 50 units in NaCl 0.9 % 250 mL (0.2 units/mL) infusion **ADULT SHOCK** Stopped (01/28/16 1711)     Recent Labs:  Lab Results   Component Value Date    NA 134 (L) 02/03/2016    K 4.0 02/03/2016    K 3.69 01/23/2016    CL 102 02/03/2016    CL 104.1 01/23/2016    CO2 24.9 02/03/2016    CO2 20 (L) 01/23/2016    GLU 148 (H) 02/03/2016    GLU 85 01/23/2016    BUN 26 (H) 02/03/2016    CREATININES 1.25 (H) 02/03/2016    CALCIUM 9.5 02/03/2016    AST 41 (H) 02/03/2016    ALT 8 02/03/2016    ALKPHOS 135 (H) 02/03/2016    BILITOT 0.90 01/21/2016    ALBUMIN 2.5 (L) 02/03/2016    GLOB 2.9 02/03/2016    AGRATIO 0.9 (L) 02/03/2016    ANIONGAP 7.1 02/03/2016    ANIONGAP 21.0 (H) 01/23/2016    LABGLOM 44 (L) 02/03/2016    LABGLOM >60.0 01/23/2016    WHITEBLOODCE 17.7 (H) 02/03/2016    LABPLAT 31 (L) 02/03/2016    HGB 8.6 (L) 02/03/2016    PROTIME 43.5 (H) 02/01/2016    INR 3.8 (H) 02/01/2016    APTT 39 (H) 02/03/2016          Vital Signs:      ? Cardiac output CCO: 5.1 L/Min    ? Cardiac Index CCI: 2.3 L/min  Temp: 36.2 ?C (97.2 ?F) BP: (!) 124/46 Pulse: 99 Resp: 17 SpO2: 99 % on   Ventilator   Intake/Output:     CT OUTPUT: 340    URINE OUTPUT: 45    Exam:  Neuro: Intubated but follows commands  Cardiac: Atrial fibrillation irregular no murmurs  Lungs;clear  ABDOMEN:abdomen is soft without significant tenderness, masses, organomegaly or guarding  EXTREMITIES 2+ edema in the lower extremities toes appear much better digital ischemia much improved    PLAN:     Continue  aggressive treatment  Appreciate everyone excellent care          [employer]

## 2016-02-03 NOTE — Progress Notes (Incomplete)
CRRT restarted with newly placed RIJ non tunneled catheter. Good flow.

## 2016-02-03 NOTE — Progress Notes (Signed)
END OF SHIFT SUMMARY:      SENSORIUM: The patient remains sedated on mechanical ventilation.       BS (and any changes with tx): clear/decreased, no change.    TREATMENTS (or changes in treatment): continue albuterol Q4 hours.      SUCTIONING (amount, color, tenacity): moderate amounts of thin clear.       ABG: No ABG drawn this shift.     VENTILATOR/BiPAP CHANGES: short SBT today stopped due to lie placements.      SBT/WEANING (settings, length of trial, toleration): plan to continue SBT this afternoon after line placement.      PROCEDURES: multiple line placements.       ETT ADVANCED/RETRACTED/SECURE: secure and patent 23@ teeth.     SKIN INTEGRITY: intact.     GOALS/PLAN: resume SBT this afternoon.       COMMENTS: tachypnea high 20's to low 30's.

## 2016-02-04 ENCOUNTER — Inpatient Hospital Stay: Admit: 2016-02-04 | Payer: MEDICAID

## 2016-02-04 LAB — ABG
A-a Gradient: 13.7 mmHg
A-a Gradient: 66.3 mmHg
Base Excess: 0.1 mmol/L (ref ?–2.0)
Base Excess: 0 mmol/L (ref ?–2.0)
CPAP: 5 cmH2O
Carboxyhemoglobin: 0.3 % (ref 0.0–1.5)
Carboxyhemoglobin: 0.6 % (ref 0.0–1.5)
FIO2: 30
FIO2: 35
HCO3 Arterial: 25.1 mmol/L (ref 22.0–26.0)
HCO3 Arterial: 23.5 mmol/L (ref 22.0–26.0)
Hemoglobin: 10.2 g/dL — ABNORMAL LOW (ref 12.0–16.0)
Hemoglobin: 11.6 g/dL — ABNORMAL LOW (ref 12.0–16.0)
Methemoglobin: 0.1 % (ref 0.0–1.0)
Methemoglobin: 0.3 % (ref 0.0–1.0)
Minute Ventilation: 11.7
Minute Ventilation: 14.2
O2Hb: 98.5 % — ABNORMAL HIGH (ref 94.0–97.0)
O2Hb: 98.1 % — ABNORMAL HIGH (ref 94.0–97.0)
PSV: 5
Patient Temperature: 37 C
Patient Temperature: 37 C
Peep: 5 cmH2O
Puncture Attempts: 1
Puncture Attempts: 1
Respiratory Rate: 27 b/min
Respiratory Rate: 25 b/min
Tidal Volume: 710 mL
Tidal Volume: 570 mL
Time Analyzed: 20170825042124
Time Analyzed: 20170825124532
a/A Ratio: 0.91
a/A Ratio: 0.66
pCO2 Arterial: 42.5 mmHg (ref 35.0–45.0)
pCO2 Arterial: 34.5 mmHg — ABNORMAL LOW (ref 35.0–45.0)
pH Arterial: 7.39 (ref 7.350–7.450)
pH Arterial: 7.452 — ABNORMAL HIGH (ref 7.350–7.450)
pO2 Arterial: 140.4 mmHg — ABNORMAL HIGH (ref 80.0–100.0)
pO2 Arterial: 131.6 mmHg — ABNORMAL HIGH (ref 80.0–100.0)
sO2 Arterial: 98.9 % — ABNORMAL HIGH (ref 92.0–98.5)
sO2 Arterial: 99 % — ABNORMAL HIGH (ref 92.0–98.5)

## 2016-02-04 LAB — CBC WITH AUTO DIFFERENTIAL
Basophils %: 0 % (ref 0–2)
Basophils, Absolute: 0 10*3/ÂµL (ref 0.0–0.2)
Eosinophils %: 1 % (ref 0–7)
Eosinophils, Absolute: 0.2 10*3/ÂµL (ref 0.0–0.7)
HCT: 27.8 % — ABNORMAL LOW (ref 37.0–48.0)
Hemoglobin: 8.6 g/dL — ABNORMAL LOW (ref 12.0–16.0)
Lymphocytes %: 6 % — ABNORMAL LOW (ref 25–45)
Lymphocytes, Absolute: 1 10*3/ÂµL — ABNORMAL LOW (ref 1.1–4.3)
MCH: 22.7 pg — ABNORMAL LOW (ref 27.0–34.0)
MCHC: 31.1 g/dL — ABNORMAL LOW (ref 32.0–36.0)
MCV: 73.1 fL — ABNORMAL LOW (ref 81.0–99.0)
MPV: 7.5 fL (ref 7.4–10.4)
Metamyelocytes %: 1 % — ABNORMAL HIGH
Metamyelocytes, Absolute: 0.2 10*3/ÂµL — ABNORMAL HIGH
Monocytes %: 4 % (ref 0–12)
Monocytes, Absolute: 0.7 10*3/ÂµL (ref 0.0–1.2)
Myelocytes %: 1 % — ABNORMAL HIGH
Myelocytes, Absolute: 0.2 10*3/ÂµL — ABNORMAL HIGH
Neutrophils %: 87 % — ABNORMAL HIGH (ref 35–70)
Neutrophils, Absolute: 15.1 10*3/ÂµL — ABNORMAL HIGH (ref 1.6–7.3)
Nucleated Red Blood Cells %: 2 % — ABNORMAL HIGH (ref <1.0)
Platelet Count: 32 10*3/ÂµL — ABNORMAL LOW (ref 150–400)
Platelet Estimate: DECREASED — AB
RBC: 3.81 10*6/ÂµL — ABNORMAL LOW (ref 4.20–5.40)
RDW: 26.3 % — ABNORMAL HIGH (ref 11.5–14.5)
WBC: 17.3 10*3/ÂµL — ABNORMAL HIGH (ref 4.8–10.8)

## 2016-02-04 LAB — COMPREHENSIVE METABOLIC PANEL
ALT - Alanine Aminotransferase: 10 IU/L (ref 7–52)
AST - Aspartate Aminotransferase: 26 IU/L (ref 8–39)
Albumin/Globulin Ratio: 0.9 — ABNORMAL LOW (ref >0.9)
Albumin: 2.6 g/dL — ABNORMAL LOW (ref 3.5–5.0)
Alkaline Phosphatase: 121 IU/L — ABNORMAL HIGH (ref 34–104)
Anion Gap: 8.6 mmol/L (ref 3.0–11.0)
BUN: 30 mg/dL — ABNORMAL HIGH (ref 6–23)
Bilirubin Total: 0.8 mg/dL (ref 0.3–1.2)
CO2 - Carbon Dioxide: 23.4 mmol/L (ref 21.0–31.0)
Calcium: 9.3 mg/dL (ref 8.6–10.3)
Chloride: 102 mmol/L (ref 98–111)
Creatinine: 1.2 mg/dL — ABNORMAL HIGH (ref 0.55–1.10)
GFR Estimate: 46 mL/min/{1.73_m2} — ABNORMAL LOW (ref >=60)
Globulin: 3 g/dL (ref 2.2–3.7)
Glucose: 122 mg/dL — ABNORMAL HIGH (ref 80–99)
Potassium: 4.2 mmol/L (ref 3.5–5.1)
Protein Total: 5.6 g/dL — ABNORMAL LOW (ref 6.0–8.0)
Sodium: 134 mmol/L — ABNORMAL LOW (ref 135–143)

## 2016-02-04 LAB — APTT
APTT: 47 Seconds — ABNORMAL HIGH (ref 23–36)
APTT: 52 Seconds — ABNORMAL HIGH (ref 23–36)
APTT: 55 Seconds — ABNORMAL HIGH (ref 23–36)

## 2016-02-04 LAB — POCT GLUCOSE
POC Glucose: 164 mg/dL — ABNORMAL HIGH (ref 80–99)
POC Glucose: 164 mg/dL — ABNORMAL HIGH (ref 80–99)
POC Glucose: 177 mg/dL — ABNORMAL HIGH (ref 80–99)
POC Glucose: 175 mg/dL — ABNORMAL HIGH (ref 80–99)
POC Glucose: 157 mg/dL — ABNORMAL HIGH (ref 80–99)
POC Glucose: 128 mg/dL — ABNORMAL HIGH (ref 80–99)
POC Glucose: 121 mg/dL — ABNORMAL HIGH (ref 80–99)
POC Glucose: 109 mg/dL — ABNORMAL HIGH (ref 80–99)
POC Glucose: 120 mg/dL — ABNORMAL HIGH (ref 80–99)
POC Glucose: 120 mg/dL — ABNORMAL HIGH (ref 80–99)
POC Glucose: 156 mg/dL — ABNORMAL HIGH (ref 80–99)
POC Glucose: 118 mg/dL — ABNORMAL HIGH (ref 80–99)
POC Glucose: 96 mg/dL (ref 80–99)
POC Glucose: 97 mg/dL (ref 80–99)
POC Glucose: 125 mg/dL — ABNORMAL HIGH (ref 80–99)
POC Glucose: 87 mg/dL (ref 80–99)
POC Glucose: 112 mg/dL — ABNORMAL HIGH (ref 80–99)
POC Glucose: 128 mg/dL — ABNORMAL HIGH (ref 80–99)
POC Glucose: 116 mg/dL — ABNORMAL HIGH (ref 80–99)
POC Glucose: 99 mg/dL (ref 80–99)
POC Glucose: 109 mg/dL — ABNORMAL HIGH (ref 80–99)

## 2016-02-04 LAB — PT & PTT
APTT: 40 Seconds — ABNORMAL HIGH (ref 23–36)
INR: 2 — ABNORMAL HIGH (ref 0.9–1.2)
Protime: 22.3 Seconds — ABNORMAL HIGH (ref 10.0–13.0)

## 2016-02-04 LAB — RENAL FUNCTION PANEL
Albumin: 2.5 g/dL — ABNORMAL LOW (ref 3.5–5.0)
Albumin: 2.5 g/dL — ABNORMAL LOW (ref 3.5–5.0)
Albumin: 2.6 g/dL — ABNORMAL LOW (ref 3.5–5.0)
Anion Gap: 7.3 mmol/L (ref 3.0–11.0)
Anion Gap: 8.4 mmol/L (ref 3.0–11.0)
Anion Gap: 12.1 mmol/L — ABNORMAL HIGH (ref 3.0–11.0)
BUN: 30 mg/dL — ABNORMAL HIGH (ref 6–23)
BUN: 30 mg/dL — ABNORMAL HIGH (ref 6–23)
BUN: 46 mg/dL — ABNORMAL HIGH (ref 6–23)
CO2 - Carbon Dioxide: 23.7 mmol/L (ref 21.0–31.0)
CO2 - Carbon Dioxide: 23.6 mmol/L (ref 21.0–31.0)
CO2 - Carbon Dioxide: 20.9 mmol/L — ABNORMAL LOW (ref 21.0–31.0)
Calcium: 9.2 mg/dL (ref 8.6–10.3)
Calcium: 9.3 mg/dL (ref 8.6–10.3)
Calcium: 9.4 mg/dL (ref 8.6–10.3)
Chloride: 103 mmol/L (ref 98–111)
Chloride: 102 mmol/L (ref 98–111)
Chloride: 100 mmol/L (ref 98–111)
Creatinine: 1.44 mg/dL — ABNORMAL HIGH (ref 0.55–1.10)
Creatinine: 1.25 mg/dL — ABNORMAL HIGH (ref 0.55–1.10)
Creatinine: 1.67 mg/dL — ABNORMAL HIGH (ref 0.55–1.10)
GFR Estimate: 37 mL/min/{1.73_m2} — ABNORMAL LOW (ref >=60)
GFR Estimate: 44 mL/min/{1.73_m2} — ABNORMAL LOW (ref >=60)
GFR Estimate: 31 mL/min/{1.73_m2} — ABNORMAL LOW (ref >=60)
Glucose: 152 mg/dL — ABNORMAL HIGH (ref 80–99)
Glucose: 179 mg/dL — ABNORMAL HIGH (ref 80–99)
Glucose: 99 mg/dL (ref 80–99)
Phosphorus: 3.4 mg/dL (ref 2.5–4.5)
Phosphorus: 3.5 mg/dL (ref 2.5–4.5)
Phosphorus: 3.5 mg/dL (ref 2.5–4.5)
Potassium: 4.3 mmol/L (ref 3.5–5.1)
Potassium: 4.3 mmol/L (ref 3.5–5.1)
Potassium: 4.7 mmol/L (ref 3.5–5.1)
Sodium: 134 mmol/L — ABNORMAL LOW (ref 135–143)
Sodium: 134 mmol/L — ABNORMAL LOW (ref 135–143)
Sodium: 133 mmol/L — ABNORMAL LOW (ref 135–143)

## 2016-02-04 LAB — CBC WITHOUT DIFFERENTIAL
HCT: 27.8 % — ABNORMAL LOW (ref 37.0–48.0)
Hemoglobin: 8.6 g/dL — ABNORMAL LOW (ref 12.0–16.0)
MCH: 22.7 pg — ABNORMAL LOW (ref 27.0–34.0)
MCHC: 31.1 g/dL — ABNORMAL LOW (ref 32.0–36.0)
MCV: 73.1 fL — ABNORMAL LOW (ref 81.0–99.0)
MPV: 7.5 fL (ref 7.4–10.4)
Platelet Count: 32 10*3/ÂµL — ABNORMAL LOW (ref 150–400)
RBC: 3.81 10*6/ÂµL — ABNORMAL LOW (ref 4.20–5.40)
RDW: 26.3 % — ABNORMAL HIGH (ref 11.5–14.5)
WBC: 17.3 10*3/ÂµL — ABNORMAL HIGH (ref 4.8–10.8)

## 2016-02-04 LAB — MAGNESIUM
Magnesium: 2 mg/dL (ref 1.6–2.4)
Magnesium: 1.9 mg/dL (ref 1.6–2.4)
Magnesium: 2.1 mg/dL (ref 1.6–2.4)

## 2016-02-04 LAB — PHOSPHORUS: Phosphorus: 2.9 mg/dL (ref 2.5–4.5)

## 2016-02-04 LAB — IONIZED CALCIUM
Ionized Calcium: 1.35 mmol/L — ABNORMAL HIGH (ref 1.12–1.30)
Ionized Calcium: 1.34 mmol/L — ABNORMAL HIGH (ref 1.12–1.30)
Ionized Calcium: 1.35 mmol/L — ABNORMAL HIGH (ref 1.12–1.30)

## 2016-02-04 MED ORDER — sterile water (bottle) irrigation 30 mL
Status: DC
Start: 2016-02-04 — End: 2016-02-05
  Administered 2016-02-04 – 2016-02-05 (×10)

## 2016-02-04 MED FILL — PRISMASOL BGK K (2 MEQ/L)-MG(1MEQ/L) HEMODIALYSIS SOLUTION: ARTERIOVENOUS_FISTULA | Qty: 5

## 2016-02-04 MED FILL — NEXTERONE 360 MG/200 ML (1.8 MG/ML) INTRAVENOUS SOLUTION: 360 mg/200 mL (1.8 mg/mL) | INTRAVENOUS | Qty: 360

## 2016-02-04 MED FILL — PROPOFOL 10 MG/ML INTRAVENOUS EMULSION: 10 mg/mL | INTRAVENOUS | Qty: 100

## 2016-02-04 MED FILL — NOREPINEPHRINE BITARTRATE 4 MG/250 ML (16 MCG/ML) IN DEXTROSE 5 % IV: 4 mg/250 mL (16 mcg/mL) | INTRAVENOUS | Qty: 250

## 2016-02-04 MED FILL — DEXTROSE 2.45 GRAM-SOD CITRATE 2.2 GRAM-CITRIC AC 730 MG/100 ML DIALYSIS FLUSH: 3.0000 mL | Qty: 3

## 2016-02-04 MED FILL — DEXMEDETOMIDINE 100 MCG/ML INTRAVENOUS SOLUTION: 100 ug/mL | INTRAVENOUS | Qty: 4

## 2016-02-04 MED FILL — ACD-A SOLUTION: 3.0000 mL | Qty: 1000

## 2016-02-04 MED FILL — ARGATROBAN 50 MG/50 ML (1 MG/ML) IN SODIUM CHLORIDE (ISO-OSMOTIC) IV: 50 mg/50 mL (1 mg/mL) | INTRAVENOUS | Qty: 50

## 2016-02-04 MED FILL — FAMOTIDINE (PF) IN 0.9% NACL 20 MG/50 ML IVPB PREMIX: 20 mg/50 mL | INTRAVENOUS | Qty: 50

## 2016-02-04 MED FILL — PRECEDEX 100 MCG/ML INTRAVENOUS SOLUTION: 100 ug/mL | INTRAVENOUS | Qty: 4

## 2016-02-04 MED FILL — DEXMEDETOMIDINE 100 MCG/ML INTRAVENOUS SOLUTION: 100 ug/mL | INTRAVENOUS | Qty: 400

## 2016-02-04 MED FILL — NOVOLIN R REGULAR U-100 INSULIN 100 UNIT/ML INJECTION SOLUTION: 100 [IU]/mL | INTRAMUSCULAR | Qty: 1.5

## 2016-02-04 MED FILL — SOLU-CORTEF ACT-O-VIAL (PF) 100 MG/2 ML SOLUTION FOR INJECTION: 100 | INTRAMUSCULAR | Qty: 2

## 2016-02-04 MED FILL — DEXMEDETOMIDINE 100 MCG/ML INTRAVENOUS SOLUTION: 100 ug/mL | INTRAVENOUS | Qty: 2

## 2016-02-04 MED FILL — ASPIRIN 300 MG RECTAL SUPPOSITORY: 300 mg | RECTAL | Qty: 1

## 2016-02-04 MED FILL — FENTANYL (PF) IN 0.9 % NACL 1250 MCG/250 ML PREMIX INFUSION: 1250 mcg/250 mL (5 mcg/mL) | INTRAVENOUS | Qty: 250

## 2016-02-04 MED FILL — AMIODARONE 200 MG TABLET: 200 mg | ORAL | Qty: 2

## 2016-02-04 MED FILL — CHLORHEXIDINE GLUCONATE 0.12 % MOUTHWASH: 0.12 % | Qty: 15

## 2016-02-04 MED FILL — MAGNESIUM SULFATE 1 GRAM/100 ML IN DEXTROSE 5 % INTRAVENOUS PIGGYBACK: 1 gram/100 mL | INTRAVENOUS | Qty: 100

## 2016-02-04 MED FILL — LEVOFLOXACIN IN D5W 750 MG/150 ML IVPB PREMIX: 750 mg/150 mL | INTRAVENOUS | Qty: 150

## 2016-02-04 NOTE — Progress Notes (Signed)
Respiratory End of Shift Summary   02/04/2016                    5:18 PM         Sensorium    Awake non verbal on ventilator.    Breath Sounds   (changes during shift)   Diminished bilat.    Response to therapy   (or changes in therapy)   No changes noted   Is cough productive or require suctioning (frequency, characteristics)         Suction small amounts thin clear secretions    Ventilator  (start & end of shift settings   and other relevant assessments)             Prvc, 20, 480, +5, 30%. Then cpap 5/5 30% for sbt.      BiPAP   (settings, changes, tolerance, usage, continuous, sleep only)   na    Weaning attempts/plans                 From 1010 to 1500 on cpap 5/5. Will retry 8/26    Skin Integrity Skin appears ok     Significant Events  (transport, procedures)   none   Other Comments      Jerl Santos, RRT

## 2016-02-04 NOTE — Progress Notes (Signed)
Daily Progress Note     Name: Brandy [name]  DOB: Jul 24, 1955 60 y.o.  MRN: [id number]  CSN: [id number]    Assessment & Plan:     Active Hospital Problems    Diagnosis SNOMED CT(R) Date Noted   . Heparin induced thrombocytopenia (CMS/HCC) HEPARIN-INDUCED THROMBOCYTOPENIA 02/02/2016   . S/P AVR (aortic valve replacement) HISTORY OF AORTIC VALVE REPLACEMENT 01/27/2016   . Respiratory failure (CMS/HCC) RESPIRATORY FAILURE 01/23/2016   . Aortic stenosis, severe AORTIC VALVE STENOSIS 01/22/2016   . NSTEMI (non-ST elevated myocardial infarction) (CMS/HCC) ACUTE NON-ST SEGMENT ELEVATION MYOCARDIAL INFARCTION 01/21/2016   . Demand ischemia of myocardium (CMS/HCC) ACUTE ISCHEMIC HEART DISEASE 01/21/2016   . Iron deficiency anemia IRON DEFICIENCY ANEMIA 01/21/2016   . Obesity OBESITY 01/21/2016     --AKI: ATN secondary to cardiogenic shock and possible sepsis from nosocomial pneumonia.  CRRT discontinued due to high return pressures. Volume status improved. She remains on low dose levophed for vasopressor suport.  New right IJ dialysis catheter placed on 8/24.  Hemodynamics seems to be improving. Patient may be appropriate for conventional HD in 1-2 days.    --Pneumonia: On Levaquin, now afebrile.  --S/P AVR:  Complicated by cardiogenic shock.  Hemodynamics improving.  Weaning pressor support.  --A-fib with RVR: Started on amiodarone.   --Heparin Induced Thrombcytopenia. Stable, off heparin.  --DVT:  On agatroban    Plan:   --Avoid heparin  --Lock dialysis cath with citrate  --Discontinue CRRT  --Eval tomorrow with possible transition to conventional HD if hemodynamics allow  --Dr. Dulce Sellar to see the patient in AM    Subjective:     Brandy [name] is a 60 y.o. female patient. Patient was admitted with critical AS s/p AVR complicated by cardiogenic shock.  New RIJ CVC placed yesterday. CRRT was discontinued this AM due to high return pressures. Remains on amiodarone for A-fib.  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  Still requiring pressor support with Levophed at low doses.  I/O negative 0.5 L.  Remains oliguric.    Scheduled Medications:   . albuterol  2.5 mg Nebulization Q4H   . amiodarone  400 mg Oral Q12H   . aspirin  300 mg Rectal Daily   . chlorhexidine  15 mL Mouth/Throat Q12H   . famotidine  20 mg Intravenous Daily   . hydrocortisone sod succ (PF)  50 mg Intravenous Q6H   . levofloxacin (LEVAQUIN) IV  750 mg Intravenous Q24H   . phenylephrine       . phenylephrine       . sodium chloride 0.9 % (NS) syringe  10 mL Intravenous Q8H SCH   . sodium chloride 0.9 % (NS) syringe  5-10 mL Intravenous Daily   . sterile water (bottle)  30 mL Irrigation Q4H     Infusions:   . amiodarone in D5W 0.5 mg/min (02/04/16 0209)   . argatroban 0.7 mcg/kg/min (02/04/16 0631)   . clevidipine Stopped (02/02/16 0800)   . dexmedetomidine 1.4 mcg/kg/hr (02/04/16 0646)   . dextrose 10% (D10W)     . dextrose 5% lactated ringers Stopped (01/29/16 1829)   . DOBUTamine (DOBUTREX) 250 mg in D5W 250 mL (1000 mcg/mL) infusion     . fentanyl 100 mcg/hr (02/03/16 2336)   . insulin (HUMAN R) 150 Units in NaCl 0.9 % 150 mL (1 unit/mL) infusion - cardiac surgery 3 Units/hr (02/04/16 0746)   . midazolam Stopped (01/31/16 0900)   . milrinone Stopped (02/02/16 0736)   . nitroglycerin     .  norepinephrine 3 mcg/min (02/04/16 0631)   . phenylephrine (NEO-SYNEPHRINE) 40 mg in NaCl 0.9 % 500 mL (80 mcg/mL) infusion **DOUBLE CONCENTRATION** Stopped (01/31/16 0515)   . propofol 20 mcg/kg/min (02/04/16 0705)   . vasopressin (PITRESSIN) 50 units in NaCl 0.9 % 250 mL (0.2 units/mL) infusion **ADULT SHOCK** Stopped (01/28/16 1711)     PRN Medications: acetaminophen (TYLENOL) suppository, acetaminophen, amiodarone in D5W, anticoagulant citrate dextrose solution A, calcium gluconate IVPB, clevidipine, dextrose 5% lactated ringers, dextrose, DOBUTamine (DOBUTREX) 250 mg in D5W 250 mL (1000 mcg/mL) infusion, EPINEPHrine, fentanyl, hydrALAZINE, HYDROcodone-acetaminophen,  insulin (HUMAN R) 150 Units in NaCl 0.9 % 150 mL (1 unit/mL) infusion - cardiac surgery, magnesium sulfate IVPB, magnesium sulfate IVPB, meperidine (PF), midazolam, milrinone, morphine, nitroglycerin, norepinephrine, oxyCODONE, phenylephrine (NEO-SYNEPHRINE) 40 mg in NaCl 0.9 % 500 mL (80 mcg/mL) infusion **DOUBLE CONCENTRATION**, propofol, protamine, sodium chloride, sodium chloride 0.9 % (NS) syringe, sodium chloride 0.9 % (NS) syringe, sterile water (bottle)    Objective:     Vital Signs:    Vital Sign Ranges for Last 24 Hours:  Temp  Min: 35.2 ?C (95.4 ?F)  Max: 36.2 ?C (97.2 ?F)  Pulse  Min: 75  Max: 114  Resp  Min: 12  Max: 29  SpO2  Min: 99 %  Max: 100 %    Most Recent Vitals  Pulse: 86  Resp: 17  Temp: 35.2 ?C (95.4 ?F)  SpO2: 100 % (08/25 0530-08/25 0630)    Intake/Ouput:    Intake/Output Summary (Last 24 hours) at 02/04/16 0752  Last data filed at 02/04/16 0559   Gross per 24 hour   Intake           3139.7 ml   Output             3455 ml   Net           -315.3 ml        Exam:  NAD, intubated  RIJ CVC  Coarse UA BS  Irreg, tachy  Abd: Obese, nontender  Ext: No edema  Skin: No rash.    Recent Labs:  Results for orders placed or performed during the hospital encounter of 01/23/16 (from the past 24 hour(s))   POCT Glucose -Next Routine    Collection Time: 02/03/16  8:10 AM   Result Value Ref Range    POC Glucose 167 (H) 80 - 99 mg/dL   Magnesium -Z6X    Collection Time: 02/03/16  9:55 AM   Result Value Ref Range    Magnesium 2.1 1.6 - 2.4 mg/dL   Ionized Calcium -W9U    Collection Time: 02/03/16  9:55 AM   Result Value Ref Range    Ionized Calcium 1.39 (H) 1.12 - 1.30 mmol/L   Renal Function Panel -Q6H    Collection Time: 02/03/16  9:55 AM   Result Value Ref Range    Albumin 2.5 (L) 3.5 - 5.0 g/dL    Calcium 9.5 8.6 - 04.5 mg/dL    Phosphorus 2.8 2.5 - 4.5 mg/dL    BUN 26 (H) 6 - 23 mg/dL    Creatinine 4.09 (H) 0.55 - 1.10 mg/dL    Sodium 811 (L) 914 - 143 mmol/L    Glucose 148 (H) 80 - 99 mg/dL    Potassium  4.0 3.5 - 5.1 mmol/L    Chloride 102 98 - 111 mmol/L    CO2 - Carbon Dioxide 24.9 21.0 - 31.0 mmol/L    Anion Gap 7.1 3.0 - 11.0 mmol/L  GFR Estimate 44 (L) >=60 mL/min/1.27m*2    GFR Additional Info     APTT -Timed    Collection Time: 02/03/16  9:55 AM   Result Value Ref Range    APTT 45 (H) 23 - 36 Seconds   POCT Glucose -Next Routine    Collection Time: 02/03/16 11:50 AM   Result Value Ref Range    POC Glucose 155 (H) 80 - 99 mg/dL   APTT -STAT    Collection Time: 02/03/16 12:15 PM   Result Value Ref Range    APTT 39 (H) 23 - 36 Seconds   POCT Glucose -Next Routine    Collection Time: 02/03/16  1:43 PM   Result Value Ref Range    POC Glucose 129 (H) 80 - 99 mg/dL   POCT Glucose -Next Routine    Collection Time: 02/03/16  2:24 PM   Result Value Ref Range    POC Glucose 132 (H) 80 - 99 mg/dL   POCT Glucose -Next Routine    Collection Time: 02/03/16  4:01 PM   Result Value Ref Range    POC Glucose 133 (H) 80 - 99 mg/dL   Magnesium -Z6X    Collection Time: 02/03/16  5:57 PM   Result Value Ref Range    Magnesium 2.0 1.6 - 2.4 mg/dL   Ionized Calcium -W9U    Collection Time: 02/03/16  5:57 PM   Result Value Ref Range    Ionized Calcium 1.35 (H) 1.12 - 1.30 mmol/L   Renal Function Panel -Q6H    Collection Time: 02/03/16  5:57 PM   Result Value Ref Range    Albumin 2.5 (L) 3.5 - 5.0 g/dL    Calcium 9.2 8.6 - 04.5 mg/dL    Phosphorus 3.4 2.5 - 4.5 mg/dL    BUN 30 (H) 6 - 23 mg/dL    Creatinine 4.09 (H) 0.55 - 1.10 mg/dL    Sodium 811 (L) 914 - 143 mmol/L    Glucose 152 (H) 80 - 99 mg/dL    Potassium 4.3 3.5 - 5.1 mmol/L    Chloride 103 98 - 111 mmol/L    CO2 - Carbon Dioxide 23.7 21.0 - 31.0 mmol/L    Anion Gap 7.3 3.0 - 11.0 mmol/L    GFR Estimate 37 (L) >=60 mL/min/1.58m*2    GFR Additional Info     PT & PTT -Timed    Collection Time: 02/03/16  5:57 PM   Result Value Ref Range    Protime 22.3 (H) 10.0 - 13.0 Seconds    APTT 40 (H) 23 - 36 Seconds    INR 2.0 (H) 0.9 - 1.2   APTT -Timed    Collection Time: 02/03/16   7:44 PM   Result Value Ref Range    APTT 47 (H) 23 - 36 Seconds   POCT Glucose -Next Routine    Collection Time: 02/03/16  7:48 PM   Result Value Ref Range    POC Glucose 164 (H) 80 - 99 mg/dL   POCT Glucose -Next Routine    Collection Time: 02/03/16 10:01 PM   Result Value Ref Range    POC Glucose 164 (H) 80 - 99 mg/dL   Ionized Calcium -N8G    Collection Time: 02/03/16 10:10 PM   Result Value Ref Range    Ionized Calcium 1.34 (H) 1.12 - 1.30 mmol/L   Renal Function Panel -Q6H    Collection Time: 02/03/16 10:10 PM   Result Value Ref Range    Albumin  2.5 (L) 3.5 - 5.0 g/dL    Calcium 9.3 8.6 - 16.1 mg/dL    Phosphorus 3.5 2.5 - 4.5 mg/dL    BUN 30 (H) 6 - 23 mg/dL    Creatinine 0.96 (H) 0.55 - 1.10 mg/dL    Sodium 045 (L) 409 - 143 mmol/L    Glucose 179 (H) 80 - 99 mg/dL    Potassium 4.3 3.5 - 5.1 mmol/L    Chloride 102 98 - 111 mmol/L    CO2 - Carbon Dioxide 23.6 21.0 - 31.0 mmol/L    Anion Gap 8.4 3.0 - 11.0 mmol/L    GFR Estimate 44 (L) >=60 mL/min/1.50m*2    GFR Additional Info     Magnesium -Q6H    Collection Time: 02/03/16 10:10 PM   Result Value Ref Range    Magnesium 1.9 1.6 - 2.4 mg/dL   APTT -Timed    Collection Time: 02/03/16 10:10 PM   Result Value Ref Range    APTT 52 (H) 23 - 36 Seconds   POCT Glucose -Next Routine    Collection Time: 02/03/16 11:03 PM   Result Value Ref Range    POC Glucose 177 (H) 80 - 99 mg/dL   POCT Glucose -Next Routine    Collection Time: 02/04/16 12:06 AM   Result Value Ref Range    POC Glucose 175 (H) 80 - 99 mg/dL   POCT Glucose -Next Routine    Collection Time: 02/04/16 12:59 AM   Result Value Ref Range    POC Glucose 157 (H) 80 - 99 mg/dL   POCT Glucose -Next Routine    Collection Time: 02/04/16  2:04 AM   Result Value Ref Range    POC Glucose 128 (H) 80 - 99 mg/dL   POCT Glucose -Next Routine    Collection Time: 02/04/16  2:41 AM   Result Value Ref Range    POC Glucose 121 (H) 80 - 99 mg/dL   POCT Glucose -Next Routine    Collection Time: 02/04/16  3:41 AM   Result Value  Ref Range    POC Glucose 109 (H) 80 - 99 mg/dL   Comprehensive Metabolic Panel -Daily    Collection Time: 02/04/16  4:07 AM   Result Value Ref Range    Sodium 134 (L) 135 - 143 mmol/L    Potassium 4.2 3.5 - 5.1 mmol/L    Chloride 102 98 - 111 mmol/L    CO2 - Carbon Dioxide 23.4 21.0 - 31.0 mmol/L    Glucose 122 (H) 80 - 99 mg/dL    BUN 30 (H) 6 - 23 mg/dL    Creatinine 8.11 (H) 0.55 - 1.10 mg/dL    Calcium 9.3 8.6 - 91.4 mg/dL    AST - Aspartate Aminotransferase 26 8 - 39 IU/L    ALT - Alanine Amino transferase 10 7 - 52 IU/L    Alkaline Phosphatase 121 (H) 34 - 104 IU/L    Bilirubin Total 0.8 0.3 - 1.2 mg/dL    Protein Total 5.6 (L) 6.0 - 8.0 g/dL    Albumin 2.6 (L) 3.5 - 5.0 g/dL    Globulin 3.0 2.2 - 3.7 g/dL    Albumin/Globulin Ratio 0.9 (L) >0.9    Anion Gap 8.6 3.0 - 11.0 mmol/L    GFR Estimate 46 (L) >=60 mL/min/1.52m*2    GFR Additional Info     Phosphorus -Daily    Collection Time: 02/04/16  4:07 AM   Result Value Ref Range    Phosphorus  2.9 2.5 - 4.5 mg/dL   Magnesium -Daily    Collection Time: 02/04/16  4:07 AM   Result Value Ref Range    Magnesium 2.1 1.6 - 2.4 mg/dL   Ionized Calcium -Daily    Collection Time: 02/04/16  4:07 AM   Result Value Ref Range    Ionized Calcium 1.35 (H) 1.12 - 1.30 mmol/L   APTT -Daily    Collection Time: 02/04/16  4:07 AM   Result Value Ref Range    APTT 55 (H) 23 - 36 Seconds   CBC without Differential -Daily    Collection Time: 02/04/16  4:07 AM   Result Value Ref Range    WBC 17.3 (H) 4.8 - 10.8 10*3/?L    RBC 3.81 (L) 4.20 - 5.40 10*6/?L    Hemoglobin 8.6 (L) 12.0 - 16.0 g/dL    HCT 16.1 (L) 09.6 - 48.0 %    MCV 73.1 (L) 81.0 - 99.0 fL    MCH 22.7 (L) 27.0 - 34.0 pg    MCHC 31.1 (L) 32.0 - 36.0 g/dL    RDW 04.5 (H) 40.9 - 14.5 %    Platelet Count 32 (L) 150 - 400 10*3/?L    MPV 7.5 7.4 - 10.4 fL   CBC with Auto Differential -Daily    Collection Time: 02/04/16  4:07 AM   Result Value Ref Range    WBC 17.3 (H) 4.8 - 10.8 10*3/?L    RBC 3.81 (L) 4.20 - 5.40 10*6/?L     Hemoglobin 8.6 (L) 12.0 - 16.0 g/dL    HCT 81.1 (L) 91.4 - 48.0 %    MCV 73.1 (L) 81.0 - 99.0 fL    MCH 22.7 (L) 27.0 - 34.0 pg    MCHC 31.1 (L) 32.0 - 36.0 g/dL    RDW 78.2 (H) 95.6 - 14.5 %    Platelet Count 32 (L) 150 - 400 10*3/?L    MPV 7.5 7.4 - 10.4 fL    Neutrophils % 87 (H) 35 - 70 %    Lymphocytes % 6 (L) 25 - 45 %    Monocytes % 4 0 - 12 %    Eosinophils % 1 0 - 7 %    Basophils % 0 0 - 2 %    Myelocytes % 1 (H) 0 %    Metamyelocytes % 1 (H) 0 %    Nucleated Red Blood Cells % 2.0 (H) <1.0 %    Neutrophils, Absolute 15.1 (H) 1.6 - 7.3 10*3/?L    Lymphocytes, Absolute 1.0 (L) 1.1 - 4.3 10*3/?L    Monocytes, Absolute 0.7 0.0 - 1.2 10*3/?L    Eosinophils, Absolute 0.2 0.0 - 0.7 10*3/?L    Basophils, Absolute 0.0 0.0 - 0.2 10*3/?L    Myelocytes, Absolute 0.2 (H) 0.0 10*3/?L    Metamyelocytes, Absolute 0.2 (H) 0.0 10*3/?L    Differential Type Manual Differential     Platelet Estimate Decreased (A) Normal    Anisocytosis 3+ (A) (none)    Hypochromasia 1+ (A) (none)    Microcytes 2+ (A) (none)    Poikilocytosis 1+ (A) (none)   Blood Gas-Arterial -Daily    Collection Time: 02/04/16  4:19 AM   Result Value Ref Range    ABG PH (ASA) 7.390 7.350 - 7.450    PCO2 Arterial Blood Gas 42.5 35.0 - 45.0 mmHg    pO2 Arterial Blood Gas 140.4 (H) 80.0 - 100.0 mmHg    HCO3 Arterial Blood Gas 25.1 22.0 - 26.0 mmol/L  Base Excess 0.1 -2.0 - 2.0 mmol/L    tHb 10.2 (L) 12.0 - 16.0 g/dL    N5AO 13.0 (H) 86.5 - 97.0 %    Carboxyhemoglobin 0.3 0.0 - 1.5 %    Methemoglobin 0.1 0.0 - 1.0 %    O2 Saturation 98.9 (H) 92.0 - 98.5 %    a/A Ratio 0.91     ABG AA GRADIENT (ASA) 13.7 mmHg    Patient Temperature 37.0 C    Allen's Test NA     Sample Type Blood     Specimen Site AL     O2 Delivery Device Ventilator     FIO2 30     Ventilator Mode PRVC     Minute Ventilation 11.7     Tidal Volume 710.0 mL    Respiratory Rate 27.0 b/min    Peep 5.0 cmH2O    Drawn By rn     Puncture Attempts 1     Time Analyzed [id number]     Analyzed By  Hulda Marin    POCT Glucose -Next Routine    Collection Time: 02/04/16  4:42 AM   Result Value Ref Range    POC Glucose 120 (H) 80 - 99 mg/dL   POCT Glucose -Next Routine    Collection Time: 02/04/16  5:44 AM   Result Value Ref Range    POC Glucose 120 (H) 80 - 99 mg/dL   POCT Glucose -Next Routine    Collection Time: 02/04/16  6:40 AM   Result Value Ref Range    POC Glucose 156 (H) 80 - 99 mg/dL   POCT Glucose -Next Routine    Collection Time: 02/04/16  7:46 AM   Result Value Ref Range    POC Glucose 118 (H) 80 - 99 mg/dL     Pending Labs     Order Current Status    Blood Gas-Arterial -See Comments for Frequency In process    Extra Tubes (ASA) -Next Routine In process    Red Top -Once In process    Blood Culture -x 2 Preliminary result    Blood Culture -x 2 Preliminary result    Respiratory culture -Next Routine Preliminary result        Recent Imaging:  No results found.     LOS: 12 days     Elby Showers  02/04/2016  7:52 AM

## 2016-02-04 NOTE — Progress Notes (Signed)
Sedation vacation performed @0330 .  Patient following commands.

## 2016-02-04 NOTE — Progress Notes (Signed)
At 0545 CRRT machine alarm return pressure extremely high. Line readjusted & proceeded to continue treatment without success. Return line then flushed with saline, no resistance noted. Attempted to continue treatment again without success.All pressures on machine reading abnormally high. Treatment discontinued without blood return. Trialysis catheter flushed with ACD for line patency.

## 2016-02-04 NOTE — Treatment Summary (Addendum)
IP PT TREATMENT NOTE     [name] / [birth year] DOB:  Jan 15, 1956 / 60 y.o. Date:  02/04/2016     Precautions  standard sternal precautions, wrist restraints     ASSESSMENT     Summary:  Pt. is intubated and seems somewhat drowsy but generally participated well with LE's exercises.  UE's edematous proximally.  Pt.'s husband present who seems very supportive of pt.     Activity tolerance: Tolerates 10 - 20 min activity  Comments: currently intubated     Discharge recommendations  Mobility Equipment needs: To be determined later   Primary recommendation : Too early to determine post-discharge recommendations.      PLAN     Treatment Plan Frequency    Plan: Continue per established POC.  Pt. demonstrates limitations which require skilled PT intervention.  1x (5 days/week).  Frequency will be increased as pt's condition or discharge plan indicates.     Brandy [name] will remain on the Physical Therapy schedule until goals are met, or there has been a change in the Plan of Care.          Treatment Diagnosis Surgery / # Days Post-op (if applicable)    Primary Dx: Resp failure  Treatment Dx: altered mobility  Procedure(s) (LRB):  AORTIC VALVE REPLACEMENT; INTRA AORTIC BALLOON INSERTION (N/A) / 8 Days Post-Op       SUBJECTIVE   Pt. would nod or shake head in response to questions.     Pain Level  Current status: Pain does not limit pt's ability to participate in therapy  Pain at start of session: Unable to assess  Pain at end of session: Unable to assess     OBJECTIVE     Mental status  General Demeanor: Pleasant;Cooperative  LOC: Alert  Orientation: Oriented to:  Oriented to: Person  --> Follows one-step commands: Intermittently  Attention span: Attends, but requires cues to redirect   Medical appliances   CCU monitoring     TREATMENT  Bed Mobility/Transfers  Bed mobility       Rolling / logroll         Transition to sitting up     To resting position      Transition to sitting down       Transition to  supine       Scooting     Transfers      Transition to standing       Method        Ther. Ex.  UE exercises:: AAROM (within sternal precautions)  LE exercises: Ankle pumps;Heelslides;Short arc quads;Hip abd/adduction  Ankle pumps: 10  Heelslides: 10  Short arc quads: 10  Hip abd/adduction: 10 (PROM)       GOALS     Patient/Caregiver goals reviewed and integrated with rehab treatment plan:     Multidisciplinary Problems (Active)        Problem: Mobility: Ambulation    Goal Priority Disciplines Outcome   Gait (with precautions)     PT    Description:  Pt. to ambulate 50 feet with one person min assist, using FWW, following any pertinent precautions.    Gait Soil scientist)     PT    Description:               Problem: Mobility: Transfers    Goal Priority Disciplines Outcome   Supine to/from sitting (with precautions)     PT    Description:  Pt. to transfer  to/from sitting with one person min assist,  following any pertinent precautions.    Sitting to/from standing (with precautions)     PT    Description:  Pt. to transfer sitting to/from standing with one person min assist, following any pertinent precautions.    Bed to/from chair (with precautions)     PT    Description:  Pt. to transfer bed to/from chair with one person min assist, following any pertinent precautions.           Problem: Patient/Family Goal    Goal Priority Disciplines Outcome   Normal Status     PT    Description:  Pt. wants to return to previous level of function.     Mobility     PT    Description:  Patient wants to walk again.           Problem: Strength    Goal Priority Disciplines Outcome   Home Exercise Program (with precautions)     PT    Description:  Pt. to perform basic HEP independently following any pertinent precautions.                   First session Second session (if applicable)    Start/Stop times: 1305 - 1335 Start/Stop times:   - Stop Time:     Total time: 30 minutes  Total time:   minutes     This note to serve as a Discharge  Summary if Brandy [name] is discharged from the hospital or from therapy services.     Therapist:  Mariann Laster, PTA

## 2016-02-04 NOTE — Progress Notes (Signed)
Critical Care Progress Note:    Subjective:    The patient is a 26 year non smoking female with BMI 40 and?progressive dyspnea since June.   ??  8/11 Admitted to [facility] ?with respiratory distress, pulmonary edema, NSTEMIC, anemia requiring 2 units PRBCs. ECHO showed severe aortic stenosis, EF 30%. Heart cath demonstrated no significant coronary occlusion. ?  ??  8/13 Transferred to [facility].   ??  8/17 Dr. Debroah Baller performed aortic valve replacement and right femoral IABP placement. Intraoperative EF was noted to be 15%. The patient was brought to the CCU post operatively. She required multiple vasopressors and inotropes and also received methylene blue. Nebulized flolan for pHTN.   ??  8/18 IABP was removed.   ??  8/19 Swan clotted and was removed. Levaquin started. Flolan stopped.   ??  8/20 Temp HD catheter was placed on and CRRT initiated. Bronchoscopy performed due to fever. Foley removed, new foley placed.   ??  8/21 Still?intubated. Milrinone 0.375 mcg/kg/min,?levophed at 10 mcg/min. CRRT. Due to ischemic appearing digits right radial aline removed, left radial aline placed. No purposeful response during sedation holiday. Enteral feeds started.   ??  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 [place] confirmed RLE DVT. Patient remained?intubated on levophed, milrinone with?CRRT running. She received IV amiodarone for afib. IV hydrocortisone started given persistence of circulatory shock (requiring 9 mcg/min levophed plus milrinone).  ??  8/23 Patient awakened with sedation holiday. Spontaneous breathing attempted, appeared weak for extubation.  Milrinone has been weaned off, levophed is down to 3 mcg/min. ECHO: EF30-35%, AV prosthesis OK, moderate mitral regurgitation, evidence of RV pressure or volume overload.  .   ?  8/24 Patient awakened, followed commands. Left IJ dialysis catheter stopped functioning. LUE PICC placed, right IJ TLC removed, ew right IJ temporary HD cath placed, left IJ temp HD cath removed.   ?    Today patient awakened with sedation holiday and appeared weak. Spontaneous breathing trial is in progress.     Medications: Reviewed    Objective:    Physical Exam:    Vitals:    02/04/16 1345   BP:    Pulse: 99   Resp: (!) 31   Temp:    SpO2: 100%         Intake/Output Summary (Last 24 hours) at 02/04/16 1429  Last data filed at 02/04/16 1105   Gross per 24 hour   Intake           2964.7 ml   Output             3309 ml   Net           -344.3 ml       Weight: 113 kg (249 lb 1.9 oz)    Gen:  60 y.o.yr old  female seated in bd  HEENT: Atraumatic  Neck: Supple  Chest: sternotomy  Lungs: clear to auscultation  Cardiac: s1 s2  Abdm: Soft, non-tender  GU: foley  Ext: Warm  Neuro: Generalized weakness, otherwise appears non-focal    Continuous Infusions:  . amiodarone in D5W Stopped (02/04/16 1241)   . argatroban 0.7 mcg/kg/min (02/04/16 0821)   . clevidipine Stopped (02/02/16 0800)   . dexmedetomidine 1.4 mcg/kg/hr (02/04/16 1355)   . dextrose 10% (D10W)     . dextrose 5% lactated ringers Stopped (01/29/16 1829)   . DOBUTamine (DOBUTREX) 250 mg in D5W 250 mL (1000 mcg/mL) infusion     . fentanyl Stopped (02/04/16 0930)   .  insulin (HUMAN R) 150 Units in NaCl 0.9 % 150 mL (1 unit/mL) infusion - cardiac surgery 1 Units/hr (02/04/16 1410)   . midazolam Stopped (01/31/16 0900)   . milrinone Stopped (02/02/16 0736)   . nitroglycerin     . norepinephrine 1 mcg/min (02/04/16 1411)   . phenylephrine (NEO-SYNEPHRINE) 40 mg in NaCl 0.9 % 500 mL (80 mcg/mL) infusion **DOUBLE CONCENTRATION** Stopped (01/31/16 0515)   . propofol Stopped (02/04/16 0905)   . vasopressin (PITRESSIN) 50 units in NaCl 0.9 % 250 mL (0.2 units/mL) infusion **ADULT SHOCK** Stopped (01/28/16 1711)       Meds:  . albuterol  2.5 mg Nebulization Q4H   . amiodarone  400 mg Oral Q12H   . aspirin  300 mg Rectal Daily   . chlorhexidine  15 mL Mouth/Throat Q12H   . famotidine  20 mg Intravenous Daily   . levofloxacin (LEVAQUIN) IV  750 mg Intravenous Q24H   .  phenylephrine       . phenylephrine       . sodium chloride 0.9 % (NS) syringe  10 mL Intravenous Q8H SCH   . sodium chloride 0.9 % (NS) syringe  5-10 mL Intravenous Daily   . sterile water (bottle)  30 mL Irrigation Q4H       Ventilator:   Vent Mode: PRVC/AC  FiO2 (%):  [30] 30  S RR:  [20] 20  S VT:  [480 mL] 480 mL  PEEP/CPAP (cm H2O):  [5 cm H20] 5 cm H20  MAP (cm H2O):  [9-11] 11  O2 Device: Ventilator    LABS:  Recent Labs      02/02/16   0349  02/03/16   0336  02/04/16   0407   HGB  8.1*  8.6*  8.6*  8.6*     Recent Labs      02/03/16   1757  02/03/16   2210  02/04/16   0407   NA  134*  134*  134*   K  4.3  4.3  4.2   CL  103  102  102   CO2  23.7  23.6  23.4   BUN  30*  30*  30*   GLU  152*  179*  122*     Recent Labs      02/02/16   0349  02/03/16   0336  02/04/16   0407   AST  34  41*  26   ALT  5*  8  10   ALKPHOS  128*  135*  121*     Recent Labs      02/03/16   1757   INR  2.0*     Recent Labs      02/02/16   1316  02/03/16   0344  02/04/16   0419  02/04/16   1237   PO2  113.5*  140.3*  140.4*  131.6*   PEEP  5.0  5.0  5.0   --        GLUCOSE:  Recent Labs      02/04/16   1001  02/04/16   1102  02/04/16   1206  02/04/16   1308  02/04/16   1411   POCGLU  125*  87  112*  128*  116*       Imaging:   X-ray Chest Pa Or Ap    Result Date: 02/04/2016  CHEST 1 VIEW 02/04/2016 6:08 AM PROVIDED CLINICAL INDICATIONS:  Assess tube and line  placement - post-op cardiac surgery    ADDITIONAL CLINICAL HISTORY:  None. COMPARISON:  02/03/2016. TECHNIQUE:  Portable AP view of the chest was obtained. FINDINGS:  The cardiac silhouette and mediastinal contours are unchanged compared to prior. The opacities within the RIGHT lung have improved since 02/03/2016. There is a mediastinal drain. There are bilateral chest tubes. There is a right-sided central venous catheter with distal tip in the SVC. There is a left-sided PICC with distal tip at the cavoatrial junction. There is an endotracheal tube with distal tip in the mid  thoracic trachea. The distal tip of the enteric tube is below the edge of the film.        IMPRESSION:  Improvement of the RIGHT lung opacities in comparison to 02/03/2016.     X-ray Chest Pa Or Ap    Result Date: 02/03/2016  CHEST 1 VIEW 02/03/2016 5:44 PM PROVIDED CLINICAL INDICATIONS:  line placement    ADDITIONAL CLINICAL HISTORY:  None. COMPARISON:  Earlier today TECHNIQUE:  AP portable semiupright chest, with patient turned towards the RIGHT (LAO). FINDINGS:  Bilateral pleural drains and cardiomediastinal drains in place. Sternotomy. RIGHT internal jugular vascular sheath. Endotracheal tube is at the T6 level. LEFT upper. PICC tip is poorly visualized, but is likely within the RIGHT atrium. After placement. Nasogastric tube transits the image.     IMPRESSION:  Lines and tubes as above. Otherwise no change.     X-ray Chest Pa Or Ap    Result Date: 02/03/2016  CHEST 1 VIEW 02/03/2016 3:28 PM PROVIDED CLINICAL INDICATIONS:  line placement - will call when ready    ADDITIONAL CLINICAL HISTORY:  None. COMPARISON:  02/03/2016 FINDINGS:  Frontal portable view the chest was obtained. The heart is enlarged. There is bibasilar atelectasis, LEFT greater than RIGHT. LEFT central venous catheter terminates in the superior vena cava as does the RIGHT. The nasogastric tube is seen coursing the esophagus beneath the LEFT hemidiaphragm. The endotracheal tube lies well above the carina. Bilateral chest tubes are present with no visible pneumothorax. PICC line terminates in the superior vena cava. There are sternotomy wires, skin staples and cardiac valve repair.     IMPRESSION:  1. Cardiomegaly with bibasilar atelectasis, LEFT greater than RIGHT. 2. Support tubes and lines as discussed above.       Assessment    Active Hospital Problems    Diagnosis SNOMED CT(R) Date Noted   . Heparin induced thrombocytopenia (CMS/HCC) HEPARIN-INDUCED THROMBOCYTOPENIA 02/02/2016   . S/P AVR (aortic valve replacement) HISTORY OF AORTIC VALVE  REPLACEMENT 01/27/2016   . Respiratory failure (CMS/HCC) RESPIRATORY FAILURE 01/23/2016   . Aortic stenosis, severe AORTIC VALVE STENOSIS 01/22/2016   . NSTEMI (non-ST elevated myocardial infarction) (CMS/HCC) ACUTE NON-ST SEGMENT ELEVATION MYOCARDIAL INFARCTION 01/21/2016   . Demand ischemia of myocardium (CMS/HCC) ACUTE ISCHEMIC HEART DISEASE 01/21/2016   . Iron deficiency anemia IRON DEFICIENCY ANEMIA 01/21/2016   . 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  ?  Plan:    Neurologic: Sedation holiday. Physical therapy.     Pulmonary: VAP preventive measures. Spontaneous breathing trials. Although clinically much improved she still appears to  weak for extubation today.     Cardiac: Plan per CT surgery.     ID: Levaquin day 7 of 7. WBC 17. [id number]    Renal: Foley for precise monitoring of urine output and to protect skin integrity in this moribund critically ill obese patient.     GI: Enteral feeds. Famotidine.     Endocrine: Stop hydrocortisone as circulatory shock appears resolved. Maintain euglycemia.     Heme: H/H 8/27     Platelets 32. Argatroban for HIT with acute DVT.     Prophylaxis: HOB elevation, Peridex Mouthwash, famotidine, argatroban    Code Status: full    This patient remains at very high risk of organ failure, deterioration, and/or death and continues to require critical care management.    Critical Care Time, excluding billable procedures: 44  mins    Casimiro Needle Aarini Slee,MD

## 2016-02-04 NOTE — Discharge Planning (AHS/AVS) (Signed)
I received a call from [employer] the liaison for [employer] (ph# [phone]) and he needed some documentation faxed to him on this patient to get authorization for the [employer]. An H&P was faxed but he also needs a note stating that [name] PA has had a face to face with this patient.

## 2016-02-04 NOTE — Progress Notes (Signed)
CARDIOTHORACIC SURGERY CRITICAL CARE NOTE     Name: Brandy [name]  DOB: 1955/08/02 60 y.o.  MRN: [id number]  CSN: [id number]    Brandy [name] is a 60 y.o. female patient. Patient was admitted with Respiratory failure (CMS/HCC)  Who had Procedure(s):  AORTIC VALVE REPLACEMENT; INTRA AORTIC BALLOON INSERTION.  8 Days Post-Op    HPI: The patient is a 60 y.o. female with the below medical co-morbid conditions.    ?  1. Severe symptomatic aortic stenosis                        Ejection fraction 30%     Found on TEE intra-op to be 15%                        Normal coronaries                        Predominant symptom dyspnea                     2. Severe Pulmonary Hypertension  3. Mild to Moderate Mitral regurgitation  4. Morbid obesity                        Weight loss 50 pounds in the last one year  5. respiratory insufficiency  6. Non-ST elevation MI                        The demand ischemia  7. Iron deficiency anemia                        CT chest abdomen and pelvis reviewed no abdominal masses                        Likely AVM secondary to aortic stenosis not a good candidate for sedation and EGD or colonoscopy well aortic stenosis is so critical  ?  Patient's a 60 year old female who presented the [facility] falls in respiratory distress unable to breathe.  There are her troponin was elevated she underwent oxygen therapy and was given Lasix.  Cardiac workup showed normal coronary arteries with depressed LV function on echo and severe aortic stenosis valve area less than 0.4 velocity 4.4 m/s with an EF 30%.  She was transfused 2 units of PRBCs A [facility] hospital with a hemoglobin 8.8.  His immobility volume overload from heart failure  ?  Patient hasn't been to the doctor in years she hasn't taken any medications nor does she take good care of herself.  [place] she is unable to consistently take meds.  She likes to use and asked to [employer] wood to heat her house.    ?  After a few  days of Lasix patient states that she was breathing much better she is 60% better per her suggestion.  I had a long talk with the family about the disease course why she was getting short of breath and discussion of heart failure.  They understand that she is given a need aortic valve replacement she wishes to have tissue valve I do think she is a better surgical candidate and have her given her age and risk.  ?  She has very poor dentition    POD #1  Patient has  been stable on balloon 1:2   Methylene blue last night SVR much better   Coronary perfusion good   Today I am managing acute heart failure with management of the IABP.    POD #2  removal of the intra-aortic balloon pump renal ultrasound completed showing minimal flow to the kidneys  patient still making urine  POD #3 CRRT started  POD #4 CRRT running acid base more stable pressures much better off NEO and LEVOPHED improved making urine  POD#5 CRRT continues   Patient plt 25   Will check for DVT   Postoperative day #6 CRRT continues platelets now up to 32 treating for heparin-induced from cytopenia  Postoperative day #7 Trialysis Cath replaced secondary to malfunction.  On agatroban still hemodynamics improving oxygen requirements lower.  Postoperative day #8 making transition to hemodialysis     Subjective: making progress    Assessment & Plan:     Active Hospital Problems    Diagnosis SNOMED CT(R) Date Noted   . Heparin induced thrombocytopenia (CMS/HCC) HEPARIN-INDUCED THROMBOCYTOPENIA 02/02/2016   . S/P AVR (aortic valve replacement) HISTORY OF AORTIC VALVE REPLACEMENT 01/27/2016   . Respiratory failure (CMS/HCC) RESPIRATORY FAILURE 01/23/2016   . Aortic stenosis, severe AORTIC VALVE STENOSIS 01/22/2016   . NSTEMI (non-ST elevated myocardial infarction) (CMS/HCC) ACUTE NON-ST SEGMENT ELEVATION MYOCARDIAL INFARCTION 01/21/2016   . Demand ischemia of myocardium (CMS/HCC) ACUTE ISCHEMIC HEART DISEASE 01/21/2016   . Iron deficiency anemia IRON DEFICIENCY  ANEMIA 01/21/2016   . Obesity OBESITY 01/21/2016     Neurologic: No issues sedated     Cardiac: Pre-op EF:   EF 15% prior to surgery   Post op 30% with good valve function 23mm valve     1. Inotropes off   2. Pressors: Levophed at 2         3. Rhythm: AFIB rates better controlled on amiodarone   4. Aspirin: 300 rectal   5. Beta-Blocker: hold   6. Statin: hold   7. ACE/ARB: hold        Respiratory:    Smoker none   Pulmonary toilet    Xray: improved       Gastrointestinal:    Genitourinary: creat: pre op 0.9   Now 1.16 >2.6 up to 3.4>3.26>1.85>1.37>1.25  CRRT stopped     Infectious Disease:  WBC: 17>18> 19.4>16.8>13.5>17.7>17.3    Hematology: HGB 9.8>8.6>7.7>7.5>8.6   PLTS 180> 91>64>32>25>32>31>32  Checked hitt  ++++   Stopped all heparin and started agatroban      Endocrine: Glucose management   Electrolytes: replacing Mg/K/Ca  Prophylaxis Agatroban PPI/H2 Blocker     Scheduled Medications:   . albuterol  2.5 mg Nebulization Q4H   . amiodarone  400 mg Oral Q12H   . aspirin  300 mg Rectal Daily   . chlorhexidine  15 mL Mouth/Throat Q12H   . famotidine  20 mg Intravenous Daily   . levofloxacin (LEVAQUIN) IV  750 mg Intravenous Q24H   . phenylephrine       . phenylephrine       . sodium chloride 0.9 % (NS) syringe  10 mL Intravenous Q8H SCH   . sodium chloride 0.9 % (NS) syringe  5-10 mL Intravenous Daily   . sterile water (bottle)  30 mL Irrigation Q4H     Infusions:   . amiodarone in D5W 0.5 mg/min (02/04/16 0209)   . argatroban 0.7 mcg/kg/min (02/04/16 0821)   . clevidipine Stopped (02/02/16 0800)   . dexmedetomidine 1.4 mcg/kg/hr (02/04/16 1105)   .  dextrose 10% (D10W)     . dextrose 5% lactated ringers Stopped (01/29/16 1829)   . DOBUTamine (DOBUTREX) 250 mg in D5W 250 mL (1000 mcg/mL) infusion     . fentanyl Stopped (02/04/16 0930)   . insulin (HUMAN R) 150 Units in NaCl 0.9 % 150 mL (1 unit/mL) infusion - cardiac surgery Stopped (02/04/16 1105)   . midazolam Stopped (01/31/16 0900)   . milrinone Stopped  (02/02/16 0736)   . nitroglycerin     . norepinephrine 5 mcg/min (02/04/16 0937)   . phenylephrine (NEO-SYNEPHRINE) 40 mg in NaCl 0.9 % 500 mL (80 mcg/mL) infusion **DOUBLE CONCENTRATION** Stopped (01/31/16 0515)   . propofol Stopped (02/04/16 0905)   . vasopressin (PITRESSIN) 50 units in NaCl 0.9 % 250 mL (0.2 units/mL) infusion **ADULT SHOCK** Stopped (01/28/16 1711)     Recent Labs:  Lab Results   Component Value Date    NA 134 (L) 02/04/2016    K 4.2 02/04/2016    K 3.69 01/23/2016    CL 102 02/04/2016    CL 104.1 01/23/2016    CO2 23.4 02/04/2016    CO2 20 (L) 01/23/2016    GLU 122 (H) 02/04/2016    GLU 85 01/23/2016    BUN 30 (H) 02/04/2016    CREATININES 1.20 (H) 02/04/2016    CALCIUM 9.3 02/04/2016    AST 26 02/04/2016    ALT 10 02/04/2016    ALKPHOS 121 (H) 02/04/2016    BILITOT 0.90 01/21/2016    ALBUMIN 2.6 (L) 02/04/2016    GLOB 3.0 02/04/2016    AGRATIO 0.9 (L) 02/04/2016    ANIONGAP 8.6 02/04/2016    ANIONGAP 21.0 (H) 01/23/2016    LABGLOM 46 (L) 02/04/2016    LABGLOM >60.0 01/23/2016    WHITEBLOODCE 17.3 (H) 02/04/2016    WHITEBLOODCE 17.3 (H) 02/04/2016    LABPLAT 32 (L) 02/04/2016    LABPLAT 32 (L) 02/04/2016    HGB 8.6 (L) 02/04/2016    HGB 8.6 (L) 02/04/2016    PROTIME 22.3 (H) 02/03/2016    INR 2.0 (H) 02/03/2016    APTT 55 (H) 02/04/2016          Vital Signs:      ? Cardiac output CCO: 5.1 L/Min    ? Cardiac Index CCI: 2.3 L/min  Temp: (!) 35.9 ?C (96.6 ?F) BP: 112/73 Pulse: 90 Resp: 22 SpO2: 100 % on   Ventilator   Intake/Output:     CT OUTPUT: 165    URINE OUTPUT: 25    Exam:  Neuro: Intubated but follows commands  Cardiac: Atrial fibrillation irregular no murmurs  Lungs;clear  ABDOMEN:abdomen is soft without significant tenderness, masses, organomegaly or guarding  EXTREMITIES 2+ edema in the lower extremities toes appear much better digital ischemia much improved    PLAN:   CRRT off today   Plan HD possibly     [employer]

## 2016-02-04 NOTE — Progress Notes (Addendum)
PHARMACIST CRITICAL CARE PROGRESS NOTE     Consult Per P&T Protocol: Critical Care Physician Service, Antimicrobial Interventions, Renal Dosing, IV to PO         Summary: Jules LEONA LISH is [name] admitted on 01/23/2016 with a NSTEMI s/p R/L heart craterization. PICC line placed 8/25.     Subjective: I did not speak with the patient today. Patient still on ventilator.     Allergies: Heparin and Sulfa (sulfonamide antibiotics)    PMH:  has a past medical history of History of blood transfusion.     Surgical history:  has a past surgical history that includes Tonsillectomy; Tubal ligation; Tubal ligation (1980); and ocedure (N/A, 01/27/2016).    Active problems:  ? Aortic Valve replacement  ? Acute systolic heart failure  ? NSTEMI  ? 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 Diet, NPO       Height: 167.6 cm (66) Admit weight: 101.4 kg (223 lb 8.7 oz) Last charted weight: 113 kg (249 lb 1.9 oz) IBW: 59.3 kg Body mass index is 40.21 kg/(m^2).    I/O last 3 completed shifts:  In: 4852 [I.V.:3111; NG/GT:1721; IV Piggyback:20]  Out: 3857 [Urine:47; Other:3560; Chest Tube:250]    Recent Labs      02/02/16   0349   02/03/16   0336   02/03/16   2210  02/04/16   0407  02/04/16   1527   NA  133*  133*   < >  134*  134*   < >  134*  134*  133*   K  4.2  4.2   < >  3.9  3.9   < >  4.3  4.2  4.7   CL  100  100   < >  102  102   < >  102  102  100   CO2  24.9  24.9   < >  25.0  25.0   < >  23.6  23.4  20.9*   GLU  173*  173*   < >  166*  166*   < >  179*  122*  99   BUN  17  17   < >  23  23   < >  30*  30*  46*   CREATININES  1.37*  1.37*   < >  1.14*  1.14*   < >  1.25*  1.20*  1.67*   CALCIUM  9.2  9.2   < >  9.4  9.4   < >  9.3  9.3  9.4   AST  34   --   41*   --    --   26   --    ALT  5*   --   8   --    --   10   --    ALBUMIN  2.3*  2.3*   < >  2.5*  2.5*   < >  2.5*  2.6*  2.6*    < > = values in this interval not  displayed.     Recent Labs      02/03/16   1757  02/03/16   2210  02/04/16   0407   CAION  1.35*  1.34*  1.35*     Recent Labs      02/03/16  1757  02/03/16   2210  02/04/16   0407  02/04/16   1527   MG  2.0  1.9  2.1   --    PHOS  3.4  3.5  2.9  3.5     Recent Labs      02/04/16   1411  02/04/16   1519  02/04/16   1617  02/04/16   1705  02/04/16   1808  02/04/16   2101   POCGLU  116*  99  109*  107*  116*  124*     Recent Labs      02/02/16   0349  02/03/16   0336  02/04/16   0407   WHITEBLOODCE  16.2*  17.7*  17.3*  17.3*   NEUTROABS  13.0*  15.6*  15.1*   HGB  8.1*  8.6*  8.6*  8.6*   HCT  25.9*  27.7*  27.8*  27.8*   LABPLAT  32*  31*  32*  32*       Temp (24hrs), Avg:36.2 ?C (97.2 ?F), Min:35.2 ?C (95.4 ?F), Max:36.9 ?C (98.4 ?F)      Relevant Microbiology:  Date: Source: Results:  Status:   8/20 Bronc Wash  Viral/Fungal Viral panel negative; fungal in process  No bacterial growth (final) Preliminary   8/20 BAL NGTD Final   8/19 Blood x 4 NGTD Final   8/19 ET Asp NGTD Final     ASSESSMENT:     Estimated CrCl: CRRT (started 8/20 PM)    Critical Care Prophylaxis:  VAP: Peridex 0.12% 15 mL BID   VTE: PLT 31    Heparin products discontinued.  Argatroban infusion continues  Anti-embolism stockings   SUP: Pepcid 20 mg IV daily     CBG Monitoring:   ? CBG goal in critically ill: 140-180  ? CBG in past 24H: <180      Antimicrobial Monitoring:   Indication for antimicrobials: Sepsis of unknown origin (per Intensivist 8/19)   Current antimicrobials: Levaquin 750 mg IV Q24H (8/21-current)   Relevant previous antimicrobials: Levaquin 750 mg IV Q48H (8/19)   # of days of therapy directed at source of infection or pathogen: Day 7 on 8/25   Comments about duration? Dependent on source  General sepsis: 7-10 days (Surviving Sepsis Campaign)   Notes about antimicrobial therapy or microbiology data?        PLAN:     Critical Care Prophylaxis:  ? Prophylactic medications ordered    ? Post op thrombocytopenia PLT 32, Heparin  products discontinued.  Argatroban infusion started and currently dosed at 0.7 mcg/Kg/min  ? Monitor for s/sx of bleeding    Antimicrobial Interventions:   ? Levaquin started empirically for sepsis. WBC have trended upward but stable last 48 hrs.  ? Will follow    Renal Dosing:   ? CRRT started 8/20 in PM and discontinued this AM  ? IHD may start in 1 - 2 days per Nephrology  ? Levaquin at 750 mg IV Q24H CRRT dosing - will change to 500mg  Q48h now that she is off CRRT. Next dose due 8/28.      IV to PO:   ? No switches at the moment     Argatroban infusion:  ? Using the Argatroban protocol, Heparin products were discontinued and heparin entered as an allergy.  ? This is a critically ill patient s/p surgery, aortic valve replacement, NSTEMI, On CRRT  ? Mrs. Sebek has a thrombosis in a lower  extremity.  ? Argatroban at 0.47mcg/Kg/min (4.3ml/hr)  ? aPTT had dropped below goal (of 1.5 - 3 times baseline and not > 100 sec) and argatroban dosage was titrated up on 8/24. Currently at goal.   ? Were warfarin to be considered- it may not be added to therapy until platelets return to 150K or greater and aPTT is stable.    Signed by: Drucie Ip, RPh

## 2016-02-04 NOTE — Progress Notes (Signed)
Respiratory End of Shift Summary   02/04/2016                    4:44 AM         Sensorium    Pt intubated and sedated    Breath Sounds   (changes during shift) Diminished    Response to therapy   (or changes in therapy) slt increase aeration post txs   Is cough productive or require suctioning (frequency, characteristics)       Productive of small/scant clear secretions    Ventilator  (start & end of shift settings   and other relevant assessments)             No changes made this shift            Skin Integrity Good/intact     Significant Events  (transport, procedures) none   Other Comments      Beckem Tomberlin, RRT

## 2016-02-05 ENCOUNTER — Inpatient Hospital Stay: Admit: 2016-02-05 | Payer: MEDICAID

## 2016-02-05 LAB — ABG
A-a Gradient: 45.6 mmHg
A-a Gradient: 47.3 mmHg
Base Excess: -2.3 mmol/L — ABNORMAL LOW (ref ?–2.0)
Base Excess: -2.7 mmol/L — ABNORMAL LOW (ref ?–2.0)
Base Excess: -3.3 mmol/L — ABNORMAL LOW (ref ?–2.0)
CPAP: 5 cmH2O
Carboxyhemoglobin: 0.2 % (ref 0.0–1.5)
Carboxyhemoglobin: 0.4 % (ref 0.0–1.5)
Carboxyhemoglobin: 0.5 % (ref 0.0–1.5)
FIO2: 30
FIO2: 30
HCO3 Arterial: 20 mmol/L — ABNORMAL LOW (ref 22.0–26.0)
HCO3 Arterial: 20.3 mmol/L — ABNORMAL LOW (ref 22.0–26.0)
HCO3 Arterial: 21.8 mmol/L — ABNORMAL LOW (ref 22.0–26.0)
Hemoglobin: 9.4 g/dL — ABNORMAL LOW (ref 12.0–16.0)
Hemoglobin: 9.4 g/dL — ABNORMAL LOW (ref 12.0–16.0)
Hemoglobin: 9.7 g/dL — ABNORMAL LOW (ref 12.0–16.0)
Methemoglobin: 0 % (ref 0.0–1.0)
Methemoglobin: 0.1 % (ref 0.0–1.0)
Methemoglobin: 0.1 % (ref 0.0–1.0)
Minute Ventilation: 11.2
Minute Ventilation: 16.6
O2Hb: 97.9 % — ABNORMAL HIGH (ref 94.0–97.0)
O2Hb: 98 % — ABNORMAL HIGH (ref 94.0–97.0)
O2Hb: 98.1 % — ABNORMAL HIGH (ref 94.0–97.0)
PSV: 7
Patient Temperature: 37 C
Patient Temperature: 37 C
Peep: 5 cmH2O
Peep: 5 cmH2O
Puncture Attempts: 1
Puncture Attempts: 1
Respiratory Rate: 16 b/min
Respiratory Rate: 24 b/min
Respiratory Rate: 33 b/min
Tidal Volume: 470 mL
Tidal Volume: 560 mL
Tidal Volume: 590 mL
Time Analyzed: 20170826033732
Time Analyzed: 20170826115732
Time Analyzed: 20170826163844
a/A Ratio: 0.72
a/A Ratio: 0.72
pCO2 Arterial: 27.9 mmHg — ABNORMAL LOW (ref 35.0–45.0)
pCO2 Arterial: 31.2 mmHg — ABNORMAL LOW (ref 35.0–45.0)
pCO2 Arterial: 34.4 mmHg — ABNORMAL LOW (ref 35.0–45.0)
pH Arterial: 7.419 (ref 7.350–7.450)
pH Arterial: 7.432 (ref 7.350–7.450)
pH Arterial: 7.474 — ABNORMAL HIGH (ref 7.350–7.450)
pO2 Arterial: 118 mmHg — ABNORMAL HIGH (ref 80.0–100.0)
pO2 Arterial: 119.3 mmHg — ABNORMAL HIGH (ref 80.0–100.0)
pO2 Arterial: 123.9 mmHg — ABNORMAL HIGH (ref 80.0–100.0)
sO2 Arterial: 98.4 % (ref 92.0–98.5)
sO2 Arterial: 98.4 % (ref 92.0–98.5)
sO2 Arterial: 98.5 % (ref 92.0–98.5)

## 2016-02-05 LAB — CBC WITHOUT DIFFERENTIAL
HCT: 27.5 % — ABNORMAL LOW (ref 37.0–48.0)
HCT: 27.9 % — ABNORMAL LOW (ref 37.0–48.0)
Hemoglobin: 8.3 g/dL — ABNORMAL LOW (ref 12.0–16.0)
Hemoglobin: 8.6 g/dL — ABNORMAL LOW (ref 12.0–16.0)
MCH: 22.2 pg — ABNORMAL LOW (ref 27.0–34.0)
MCH: 22.5 pg — ABNORMAL LOW (ref 27.0–34.0)
MCHC: 30 g/dL — ABNORMAL LOW (ref 32.0–36.0)
MCHC: 30.7 g/dL — ABNORMAL LOW (ref 32.0–36.0)
MCV: 73.3 fL — ABNORMAL LOW (ref 81.0–99.0)
MCV: 74 fL — ABNORMAL LOW (ref 81.0–99.0)
MPV: 8.6 fL (ref 7.4–10.4)
MPV: 8.8 fL (ref 7.4–10.4)
Platelet Count: 112 10*3/ÂµL — ABNORMAL LOW (ref 150–400)
Platelet Count: 130 10*3/ÂµL — ABNORMAL LOW (ref 150–400)
RBC: 3.71 10*6/ÂµL — ABNORMAL LOW (ref 4.20–5.40)
RBC: 3.81 10*6/ÂµL — ABNORMAL LOW (ref 4.20–5.40)
RDW: 25.5 % — ABNORMAL HIGH (ref 11.5–14.5)
RDW: 25.8 % — ABNORMAL HIGH (ref 11.5–14.5)
WBC: 22.6 10*3/ÂµL — ABNORMAL HIGH (ref 4.8–10.8)
WBC: 27.3 10*3/ÂµL — ABNORMAL HIGH (ref 4.8–10.8)

## 2016-02-05 LAB — OCCULT BLOOD, STOOL DIAGNOSTIC: Occult Blood, Stool: POSITIVE — AB

## 2016-02-05 LAB — ABO/RH (HCLL): Rh (D): POSITIVE

## 2016-02-05 LAB — CBC WITH AUTO DIFFERENTIAL
Basophils %: 0 % (ref 0–2)
Basophils, Absolute: 0 10*3/ÂµL (ref 0.0–0.2)
Eosinophils %: 0 % (ref 0–7)
Eosinophils, Absolute: 0 10*3/ÂµL (ref 0.0–0.7)
HCT: 26.8 % — ABNORMAL LOW (ref 37.0–48.0)
Hemoglobin: 8.3 g/dL — ABNORMAL LOW (ref 12.0–16.0)
Lymphocytes %: 28 % (ref 25–45)
Lymphocytes, Absolute: 6 10*3/ÂµL — ABNORMAL HIGH (ref 1.1–4.3)
MCH: 22.8 pg — ABNORMAL LOW (ref 27.0–34.0)
MCHC: 30.9 g/dL — ABNORMAL LOW (ref 32.0–36.0)
MCV: 73.8 fL — ABNORMAL LOW (ref 81.0–99.0)
MPV: 8.9 fL (ref 7.4–10.4)
Monocytes %: 9 % (ref 0–12)
Monocytes, Absolute: 1.9 10*3/ÂµL — ABNORMAL HIGH (ref 0.0–1.2)
Neutrophils %: 63 % (ref 35–70)
Neutrophils, Absolute: 13.5 10*3/ÂµL — ABNORMAL HIGH (ref 1.6–7.3)
Nucleated Red Blood Cells %: 1 % — ABNORMAL HIGH (ref <1.0)
Platelet Count: 98 10*3/ÂµL — ABNORMAL LOW (ref 150–400)
Platelet Estimate: DECREASED — AB
RBC: 3.63 10*6/ÂµL — ABNORMAL LOW (ref 4.20–5.40)
RDW: 26.1 % — ABNORMAL HIGH (ref 11.5–14.5)
WBC: 21.5 10*3/ÂµL — ABNORMAL HIGH (ref 4.8–10.8)

## 2016-02-05 LAB — POCT GLUCOSE
POC Glucose: 107 mg/dL — ABNORMAL HIGH (ref 80–99)
POC Glucose: 107 mg/dL — ABNORMAL HIGH (ref 80–99)
POC Glucose: 110 mg/dL — ABNORMAL HIGH (ref 80–99)
POC Glucose: 116 mg/dL — ABNORMAL HIGH (ref 80–99)
POC Glucose: 118 mg/dL — ABNORMAL HIGH (ref 80–99)
POC Glucose: 124 mg/dL — ABNORMAL HIGH (ref 80–99)
POC Glucose: 132 mg/dL — ABNORMAL HIGH (ref 80–99)
POC Glucose: 93 mg/dL (ref 80–99)

## 2016-02-05 LAB — BASIC METABOLIC PANEL
Anion Gap: 11.1 mmol/L — ABNORMAL HIGH (ref 3.0–11.0)
BUN: 62 mg/dL — ABNORMAL HIGH (ref 6–23)
CO2 - Carbon Dioxide: 20.9 mmol/L — ABNORMAL LOW (ref 21.0–31.0)
Calcium: 9.1 mg/dL (ref 8.6–10.3)
Chloride: 102 mmol/L (ref 98–111)
Creatinine: 2.16 mg/dL — ABNORMAL HIGH (ref 0.55–1.10)
GFR Estimate: 23 mL/min/{1.73_m2} — ABNORMAL LOW (ref >=60)
Glucose: 126 mg/dL — ABNORMAL HIGH (ref 80–99)
Potassium: 4.3 mmol/L (ref 3.5–5.1)
Sodium: 134 mmol/L — ABNORMAL LOW (ref 135–143)

## 2016-02-05 LAB — APTT: APTT: 51 Seconds — ABNORMAL HIGH (ref 23–36)

## 2016-02-05 LAB — ANTIBODY SCREEN (HCLL): Antibody Screen: NEGATIVE

## 2016-02-05 LAB — MAGNESIUM: Magnesium: 1.9 mg/dL (ref 1.6–2.4)

## 2016-02-05 MED ORDER — pantoprazole (PROTONIX) 40 mg in sodium chloride 0.9% (NS PF) 10 mL
40 | Freq: Two times a day (BID) | INTRAVENOUS | Status: DC
Start: 2016-02-05 — End: 2016-02-12
  Administered 2016-02-05 – 2016-02-12 (×15): via INTRAVENOUS

## 2016-02-05 MED ORDER — chlorhexidine (PERIDEX) 0.12 % solution 15 mL
0.12 | Freq: Two times a day (BID) | Status: DC
Start: 2016-02-05 — End: 2016-02-06
  Administered 2016-02-05: 11:00:00 0.12 mL via OROMUCOSAL

## 2016-02-05 MED ORDER — levofloxacin (LEVAQUIN) 500 mg in D5W IVPB Premix
500 | INTRAVENOUS | Status: DC
Start: 2016-02-05 — End: 2016-02-08
  Administered 2016-02-06: 19:00:00 500 mg via INTRAVENOUS
  Administered 2016-02-06: 21:00:00 500100 mg via INTRAVENOUS
  Administered 2016-02-08: 20:00:00 500 mg via INTRAVENOUS
  Administered 2016-02-08: 21:00:00 500100 mg via INTRAVENOUS

## 2016-02-05 MED ORDER — senna-docusate (PERICOLACE) 8.6-50 mg per tablet 1 tablet
8.6-50 | Freq: Two times a day (BID) | ORAL | Status: DC
Start: 2016-02-05 — End: 2016-02-04

## 2016-02-05 MED ORDER — furosemide (LASIX) injection 80 mg
10 | Freq: Three times a day (TID) | INTRAMUSCULAR | Status: DC
Start: 2016-02-05 — End: 2016-02-06
  Administered 2016-02-05 – 2016-02-06 (×3): 10 mg via INTRAVENOUS

## 2016-02-05 MED ORDER — insulin aspart (NovoLOG) PEN
100 | SUBCUTANEOUS | Status: DC
Start: 2016-02-05 — End: 2016-02-11

## 2016-02-05 MED ORDER — amiodarone (CORDARONE) 150 mg in dextrose 5 % (D5W) 100 mL bolus
50 | Freq: Once | INTRAVENOUS | Status: AC
Start: 2016-02-05 — End: 2016-02-05
  Administered 2016-02-05 (×2): via INTRAVENOUS

## 2016-02-05 MED ORDER — amiodarone (PACERONE) tablet 400 mg
200 | Freq: Two times a day (BID) | ORAL | Status: DC
Start: 2016-02-05 — End: 2016-02-05
  Administered 2016-02-05: 13:00:00 200 mg via ORAL

## 2016-02-05 MED ORDER — amiodarone 360 mg in D5W 200mL (1.8 mg/mL) premix infusion
360 | INTRAVENOUS | Status: DC | PRN
Start: 2016-02-05 — End: 2016-02-13
  Administered 2016-02-05: 23:00:00 360 mg/min via INTRAVENOUS
  Administered 2016-02-06 (×2): 3602001.8 mg/min via INTRAVENOUS
  Administered 2016-02-06: 04:00:00 360 mg/min via INTRAVENOUS
  Administered 2016-02-06 – 2016-02-09 (×4): 3602001.8 mg/min via INTRAVENOUS
  Administered 2016-02-10 (×2): 360 mg/min via INTRAVENOUS
  Administered 2016-02-10 – 2016-02-11 (×5): 3602001.8 mg/min via INTRAVENOUS
  Administered 2016-02-11: 07:00:00 360 mg/min via INTRAVENOUS

## 2016-02-05 MED FILL — FENTANYL (PF) IN 0.9 % NACL 1250 MCG/250 ML PREMIX INFUSION: 1250 mcg/250 mL (5 mcg/mL) | INTRAVENOUS | Qty: 250

## 2016-02-05 MED FILL — PROPOFOL 10 MG/ML INTRAVENOUS EMULSION: 10 mg/mL | INTRAVENOUS | Qty: 100

## 2016-02-05 MED FILL — PRECEDEX 100 MCG/ML INTRAVENOUS SOLUTION: 100 ug/mL | INTRAVENOUS | Qty: 4

## 2016-02-05 MED FILL — PROTONIX 40 MG INTRAVENOUS SOLUTION: 40 mg | INTRAVENOUS | Qty: 40

## 2016-02-05 MED FILL — NOVOLOG FLEXPEN U-100 INSULIN ASPART 100 UNIT/ML (3 ML) SUBCUTANEOUS: 100 unit/mL (3 mL) | SUBCUTANEOUS | Qty: 3

## 2016-02-05 MED FILL — FAMOTIDINE (PF) IN 0.9% NACL 20 MG/50 ML IVPB PREMIX: 20 mg/50 mL | INTRAVENOUS | Qty: 50

## 2016-02-05 MED FILL — NOREPINEPHRINE BITARTRATE 4 MG/250 ML (16 MCG/ML) IN DEXTROSE 5 % IV: 4 mg/250 mL (16 mcg/mL) | INTRAVENOUS | Qty: 250

## 2016-02-05 MED FILL — PRECEDEX 100 MCG/ML INTRAVENOUS SOLUTION: 100 ug/mL | INTRAVENOUS | Qty: 400

## 2016-02-05 MED FILL — NEXTERONE 360 MG/200 ML (1.8 MG/ML) INTRAVENOUS SOLUTION: 360 mg/200 mL (1.8 mg/mL) | INTRAVENOUS | Qty: 360

## 2016-02-05 MED FILL — ARGATROBAN 50 MG/50 ML (1 MG/ML) IN SODIUM CHLORIDE (ISO-OSMOTIC) IV: 50 mg/50 mL (1 mg/mL) | INTRAVENOUS | Qty: 50

## 2016-02-05 MED FILL — AMIODARONE 50 MG/ML INTRAVENOUS SOLUTION: 50 mg/mL | INTRAVENOUS | Qty: 3

## 2016-02-05 MED FILL — DEXMEDETOMIDINE 100 MCG/ML INTRAVENOUS SOLUTION: 100 ug/mL | INTRAVENOUS | Qty: 400

## 2016-02-05 MED FILL — DIPRIVAN 10 MG/ML INTRAVENOUS EMULSION: 10 mg/mL | INTRAVENOUS | Qty: 100

## 2016-02-05 MED FILL — AMIODARONE 200 MG TABLET: 200 mg | ORAL | Qty: 2

## 2016-02-05 MED FILL — ASPIRIN 300 MG RECTAL SUPPOSITORY: 300 mg | RECTAL | Qty: 1

## 2016-02-05 MED FILL — FUROSEMIDE 10 MG/ML INJECTION SOLUTION: 10 mg/mL | INTRAMUSCULAR | Qty: 8

## 2016-02-05 NOTE — Progress Notes (Signed)
Critical Care Progress Note:    Subjective:    The patient is a 51 year non smoking female with BMI 40 and?progressive dyspnea since June.   ??  8/11 Admitted to [facility] ?with respiratory distress, pulmonary edema, NSTEMIC, anemia requiring 2 units PRBCs. ECHO showed severe aortic stenosis, EF 30%. Heart cath demonstrated no significant coronary occlusion. ?  ??  8/13 Transferred to [facility].   ??  8/17 Dr. Debroah Baller performed aortic valve replacement and right femoral IABP placement. Intraoperative EF was noted to be 15%. The patient was brought to the CCU post operatively. She required multiple vasopressors and inotropes and also received methylene blue. Nebulized flolan for pHTN.   ??  8/18 IABP was removed.   ??  8/19 Swan clotted and was removed. Levaquin started. Flolan stopped.   ??  8/20 Temp HD catheter was placed on and CRRT initiated. Bronchoscopy performed due to fever. Foley removed, new foley placed.   ??  8/21 Still?intubated. Milrinone 0.375 mcg/kg/min,?levophed at 10 mcg/min. CRRT. Due to ischemic appearing digits right radial aline removed, left radial aline placed. No purposeful response during sedation holiday. Enteral feeds started.   ??  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 [place] confirmed RLE DVT. Patient remained?intubated on levophed, milrinone with?CRRT running. She received IV amiodarone for afib. IV hydrocortisone started given persistence of circulatory shock (requiring 9 mcg/min levophed plus milrinone).  ??  8/23 Patient awakened with sedation holiday. Spontaneous breathing attempted, appeared weak for extubation. ?Milrinone has been weaned off, levophed is down to 3 mcg/min. ECHO: EF30-35%, AV prosthesis OK, moderate mitral regurgitation, evidence of RV pressure or volume overload. ?.   ??  8/24 Patient awakened, followed commands. Left IJ dialysis catheter stopped functioning. LUE PICC placed, right IJ TLC removed, ew right IJ temporary HD cath placed, left IJ temp HD cath removed.   ?  ?  8/25 Still appeared too weak for extubation.     Today patient has passed some bloody stools. She spent several hours on spontaneous breathing trial and has been extubated. Post extubation she is midly tachypnic but denies shortness of breath, pain or other complaints. She is anxious to go home.     Medications: Reviewed    Objective:    Physical Exam:    Vitals:    02/05/16 1200   BP: 126/80   Pulse: (!) 121   Resp: (!) 27   Temp: 37.1 ?C (98.8 ?F)   SpO2: 99%         Intake/Output Summary (Last 24 hours) at 02/05/16 1356  Last data filed at 02/05/16 1203   Gross per 24 hour   Intake           3949.5 ml   Output              450 ml   Net           3499.5 ml       Weight: 118.2 kg (260 lb 9.3 oz)    Gen:  59 y.o.yr old  female lying in bed in no acute distress.  HEENT: Atraumatic  Neck: Supple  Chest: symmetric excursion  Lungs: clear to auscultation  Cardiac: s1 s2  Abdm: Soft, non-tender  GU: foley  Ext: Warm  Neuro: Generalized weakness, oriented and cooperative    Continuous Infusions:  . amiodarone in D5W Stopped (02/04/16 1241)   . argatroban 0.7 mcg/kg/min (02/05/16 1121)   . clevidipine Stopped (02/02/16 0800)   . dexmedetomidine 0.7 mcg/kg/hr (02/05/16 1121)   .  dextrose 10% (D10W)     . dextrose 5% lactated ringers Stopped (01/29/16 1829)   . DOBUTamine (DOBUTREX) 250 mg in D5W 250 mL (1000 mcg/mL) infusion     . fentanyl 50 mcg/hr (02/05/16 0544)   . insulin (HUMAN R) 150 Units in NaCl 0.9 % 150 mL (1 unit/mL) infusion - cardiac surgery Stopped (02/04/16 1939)   . midazolam Stopped (01/31/16 0900)   . milrinone Stopped (02/02/16 0736)   . nitroglycerin     . norepinephrine Stopped (02/05/16 1203)   . phenylephrine (NEO-SYNEPHRINE) 40 mg in NaCl 0.9 % 500 mL (80 mcg/mL) infusion **DOUBLE CONCENTRATION** Stopped (01/31/16 0515)   . propofol Stopped (02/05/16 0910)   . vasopressin (PITRESSIN) 50 units in NaCl 0.9 % 250 mL (0.2 units/mL) infusion **ADULT SHOCK** Stopped (01/28/16 1711)       Meds:  .  albuterol  2.5 mg Nebulization Q4H   . amiodarone  400 mg Oral Q12H   . aspirin  300 mg Rectal Daily   . chlorhexidine  15 mL Mouth/Throat Q12H   . furosemide  80 mg Intravenous Q8H   . insulin aspart   Subcutaneous 6 times per day   . [START ON 02/06/2016] levofloxacin (LEVAQUIN) IV  500 mg Intravenous Q48H   . pantoprazole  40 mg Intravenous 2 times per day   . phenylephrine       . phenylephrine       . sodium chloride 0.9 % (NS) syringe  10 mL Intravenous Q8H SCH   . sodium chloride 0.9 % (NS) syringe  5-10 mL Intravenous Daily   . sterile water (bottle)  30 mL Irrigation Q4H       Ventilator:   Vent Mode: CPAP  FiO2 (%):  [30] 30  S RR:  [0-20] 0  S VT:  [0 mL-480 mL] 0 mL  PEEP/CPAP (cm H2O):  [5 cm H20] 5 cm H20  MAP (cm H2O):  [6-10] 7  O2 Device: Ventilator    LABS:  Recent Labs      02/04/16   0407  02/05/16   0336  02/05/16   1257   HGB  8.6*  8.6*  8.3*  8.3*     Recent Labs      02/04/16   0407  02/04/16   1527  02/05/16   0336   NA  134*  133*  134*   K  4.2  4.7  4.3   CL  102  100  102   CO2  23.4  20.9*  20.9*   BUN  30*  46*  62*   GLU  122*  99  126*     Recent Labs      02/03/16   0336  02/04/16   0407   AST  41*  26   ALT  8  10   ALKPHOS  135*  121*     Recent Labs      02/03/16   1757   INR  2.0*     Recent Labs      02/04/16   0419  02/04/16   1237  02/05/16   0331  02/05/16   1153   PO2  140.4*  131.6*  118.0*  119.3*   PEEP  5.0   --   5.0  5.0       GLUCOSE:  Recent Labs      02/04/16   2101  02/05/16   0001  02/05/16   1610  02/05/16   0730  02/05/16   1154   POCGLU  124*  118*  132*  110*  93       Imaging:   X-ray Chest Pa Or Ap    Result Date: 02/05/2016  CHEST 1 VIEW 02/05/2016 4:15 AM PROVIDED CLINICAL INDICATIONS:  Assess tube and line placement - post-op cardiac surgery    ADDITIONAL CLINICAL HISTORY:  None. COMPARISON:  02/04/2016 TECHNIQUE:  Single portable semiupright radiograph of the chest FINDINGS:  Again noted is an endotracheal tube, nasogastric tube, LEFT upper extremity  PICC, pericardial drain, bilateral chest tubes and RIGHT internal jugular vein central venous catheter all in stable position. No evidence of pneumothorax. Cardiomediastinal silhouette is unchanged. There is increased aeration of the lungs with slight interval decreased opacification in the RIGHT lung base.     IMPRESSION:  1. Stable lines and tubes as above. 2. Increased aeration of the lungs with decreasing RIGHT lung base opacity.       Assessment    Active Hospital Problems    Diagnosis SNOMED CT(R) Date Noted   . Heparin induced thrombocytopenia (CMS/HCC) HEPARIN-INDUCED THROMBOCYTOPENIA 02/02/2016   . S/P AVR (aortic valve replacement) HISTORY OF AORTIC VALVE REPLACEMENT 01/27/2016   . Respiratory failure (CMS/HCC) RESPIRATORY FAILURE 01/23/2016   . Aortic stenosis, severe AORTIC VALVE STENOSIS 01/22/2016   . NSTEMI (non-ST elevated myocardial infarction) (CMS/HCC) ACUTE NON-ST SEGMENT ELEVATION MYOCARDIAL INFARCTION 01/21/2016   . Demand ischemia of myocardium (CMS/HCC) ACUTE ISCHEMIC HEART DISEASE 01/21/2016   . Iron deficiency anemia IRON DEFICIENCY ANEMIA 01/21/2016   . 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  ?  Plan:    Neurologic: PT/OT.     Pulmonary: Supplemental O2. Bronchodilators.     Cardiac: Plan per CT surgery.     ID: Tm 37.8.  WBC 17-->21-->22.   Levaquin continues, day #8 of antibiotics.     Renal: HD per nephrology service.     GI: NPO post extubation until cleared to swallow. Given GI bleeding, change famotidine to pantoprazole. GI consultation if significant drop in hemoglobin.     Endocrine: Insulin infusion is off. Insulin sliding  scale as needed to maintain euglycemia.     Heme: H/H  8/27   Platelets 112. On argatroban for HIT. Platelet count recovering. H/H stable so far in spite of blood in stool. Monitor serial CBCs.     Code Status: full    This patient remains at high risk of organ failure, deterioration, and/or death and continues to require critical care management. I visited with the patient and her husband, shared my assessment, answered her questions. I have had ongoing review with the critical care staff. Critical Care Time, excluding billable procedures: 50  mins    Casimiro Needle [place],MD

## 2016-02-05 NOTE — Progress Notes (Signed)
RESPIRATORY ASSESSMENT    SUBJECTIVE: patient says breathing ok post extubation        OBJECTIVE:   Current Vital Signs:    Pre and post C/S: diminished   HR:  121   RR:  22              Cough: fair npc   SpO2:   3lpm 98%            ASSESSMENT:  Continue treatments       PLAN: continue 2.5mg  albuterol q4hrs for wob and adventitious b/s       GOAL(S):  Medication:       Decrease work of breathing  Minimize or eliminate adventitious breath sounds        Pulmonary/Bronchial Hygiene:    Prevent or correct atelectasis      Supplemental Oxygen:   Maintain SpO2 >90   %  Normalize and maintain an adequate SpO2/PaO2 appropriate for patient clinical situation    Per PDP # 102 and MD orders    E.S.  Horald Pollen, RRT

## 2016-02-05 NOTE — Progress Notes (Signed)
picc dressing  And site wnl    Hemodialysis cath site has small scab  Dressing changed per protocol

## 2016-02-05 NOTE — Progress Notes (Addendum)
Daily Progress Note     Name: Brandy [name]  DOB: August 12, 1955 60 y.o.  MRN: [id number]  CSN: [id number]    Assessment & Plan:     Active Hospital Problems    Diagnosis SNOMED CT(R) Date Noted   . Heparin induced thrombocytopenia (CMS/HCC) HEPARIN-INDUCED THROMBOCYTOPENIA 02/02/2016   . S/P AVR (aortic valve replacement) HISTORY OF AORTIC VALVE REPLACEMENT 01/27/2016   . Respiratory failure (CMS/HCC) RESPIRATORY FAILURE 01/23/2016   . Aortic stenosis, severe AORTIC VALVE STENOSIS 01/22/2016   . NSTEMI (non-ST elevated myocardial infarction) (CMS/HCC) ACUTE NON-ST SEGMENT ELEVATION MYOCARDIAL INFARCTION 01/21/2016   . Demand ischemia of myocardium (CMS/HCC) ACUTE ISCHEMIC HEART DISEASE 01/21/2016   . Iron deficiency anemia IRON DEFICIENCY ANEMIA 01/21/2016   . Obesity OBESITY 01/21/2016     --AKI: ATN secondary to cardiogenic shock and possible sepsis from nosocomial pneumonia.  CRRT discontinued yesterday due to high return pressures. Volume status improved but remains edematous. She remains on low dose levophed for vasopressor suport.  New right IJ dialysis catheter placed on 8/24.  Hemodynamics seems to be improving.  Some urine output noted.  Creatinine 1.2-1.6-2.1    --Pneumonia: On Levaquin, now afebrile.    Persistent leukocytosis noted  --S/P AVR:  Complicated by cardiogenic shock.  Hemodynamics improving.  Weaning pressor support.  --A-fib with RVR: Started on amiodarone.   --Heparin Induced Thrombcytopenia. Stable, off heparin.  --DVT:  On agatroban  --Microcytic iron deficient anemia  --Nutrition on tube feedings    Plan:   --Trial Lasix today  --Hemodialysis tomorrow   --Medications reviewed  --Intravenous iron when pneumonia controlled  --Following    Subjective:     Brandy [name] is a 60 y.o. female patient. Patient was admitted with critical AS s/p AVR complicated by cardiogenic shock.  CRRT was discontinued yesterday due to high return pressures. Remains on amiodarone for A-fib.  On Argatroban  for DVT.  HIT positive. Still requiring pressor support with Levophed.   Urine output marginally improved to 222 cc yesterday.    Met with patient and her family at the bedside. She is on the ventilator but awake and tracks and attempts to communicate.    Scheduled Medications:   . albuterol  2.5 mg Nebulization Q4H   . amiodarone  400 mg Oral Q12H   . aspirin  300 mg Rectal Daily   . chlorhexidine  15 mL Mouth/Throat Q12H   . famotidine  20 mg Intravenous Daily   . insulin aspart   Subcutaneous 6 times per day   . [START ON 02/06/2016] levofloxacin (LEVAQUIN) IV  500 mg Intravenous Q48H   . phenylephrine       . phenylephrine       . sodium chloride 0.9 % (NS) syringe  10 mL Intravenous Q8H SCH   . sodium chloride 0.9 % (NS) syringe  5-10 mL Intravenous Daily   . sterile water (bottle)  30 mL Irrigation Q4H     Infusions:   . amiodarone in D5W Stopped (02/04/16 1241)   . argatroban 0.7 mcg/kg/min (02/05/16 0047)   . clevidipine Stopped (02/02/16 0800)   . dexmedetomidine 0.7 mcg/kg/hr (02/05/16 0910)   . dextrose 10% (D10W)     . dextrose 5% lactated ringers Stopped (01/29/16 1829)   . DOBUTamine (DOBUTREX) 250 mg in D5W 250 mL (1000 mcg/mL) infusion     . fentanyl 50 mcg/hr (02/05/16 0544)   . insulin (HUMAN R) 150 Units in NaCl 0.9 % 150 mL (1 unit/mL) infusion -  cardiac surgery Stopped (02/04/16 1939)   . midazolam Stopped (01/31/16 0900)   . milrinone Stopped (02/02/16 0736)   . nitroglycerin     . norepinephrine 9.5 mcg/min (02/05/16 0533)   . phenylephrine (NEO-SYNEPHRINE) 40 mg in NaCl 0.9 % 500 mL (80 mcg/mL) infusion **DOUBLE CONCENTRATION** Stopped (01/31/16 0515)   . propofol Stopped (02/05/16 0910)   . vasopressin (PITRESSIN) 50 units in NaCl 0.9 % 250 mL (0.2 units/mL) infusion **ADULT SHOCK** Stopped (01/28/16 1711)     PRN Medications: acetaminophen (TYLENOL) suppository, acetaminophen, amiodarone in D5W, anticoagulant citrate dextrose solution A, calcium gluconate IVPB, clevidipine, dextrose 5%  lactated ringers, dextrose, DOBUTamine (DOBUTREX) 250 mg in D5W 250 mL (1000 mcg/mL) infusion, EPINEPHrine, fentanyl, hydrALAZINE, HYDROcodone-acetaminophen, insulin (HUMAN R) 150 Units in NaCl 0.9 % 150 mL (1 unit/mL) infusion - cardiac surgery, magnesium sulfate IVPB, magnesium sulfate IVPB, meperidine (PF), midazolam, milrinone, morphine, nitroglycerin, norepinephrine, oxyCODONE, phenylephrine (NEO-SYNEPHRINE) 40 mg in NaCl 0.9 % 500 mL (80 mcg/mL) infusion **DOUBLE CONCENTRATION**, propofol, protamine, sodium chloride, sodium chloride 0.9 % (NS) syringe, sodium chloride 0.9 % (NS) syringe, sterile water (bottle)    Objective:     Vital Signs:    Vital Sign Ranges for Last 24 Hours:  BP  Min: 88/61  Max: 140/128  Temp  Min: 36.4 ?C (97.5 ?F)  Max: 37.8 ?C (100 ?F)  Pulse  Min: 88  Max: 118  Resp  Min: 17  Max: 32  SpO2  Min: 99 %  Max: 100 %    Most Recent Vitals  BP: 93/59  Pulse: 95  Resp: 28  Temp: 37.3 ?C (99.1 ?F)  SpO2: 100 % (08/26 0400-08/26 0600)    Intake/Ouput:    Intake/Output Summary (Last 24 hours) at 02/05/16 1045  Last data filed at 02/05/16 0600   Gross per 24 hour   Intake           3674.5 ml   Output              395 ml   Net           3279.5 ml        Exam:  NAD, intubated30% FiO2-awake and alert  RIJ CVC  Coarse UA BS  Atrial fibrillation controlled  Abd: Obese, nontender  Ext: 2+ edema      Recent Labs:  Results for orders placed or performed during the hospital encounter of 01/23/16 (from the past 24 hour(s))   POCT Glucose -Next Routine    Collection Time: 02/04/16 11:02 AM   Result Value Ref Range    POC Glucose 87 80 - 99 mg/dL   POCT Glucose -Next Routine    Collection Time: 02/04/16 12:06 PM   Result Value Ref Range    POC Glucose 112 (H) 80 - 99 mg/dL   Blood Gas-Arterial -Once    Collection Time: 02/04/16 12:37 PM   Result Value Ref Range    ABG PH (ASA) 7.452 (H) 7.350 - 7.450    PCO2 Arterial Blood Gas 34.5 (L) 35.0 - 45.0 mmHg    pO2 Arterial Blood Gas 131.6 (H) 80.0 - 100.0 mmHg      HCO3 Arterial Blood Gas 23.5 22.0 - 26.0 mmol/L    Base Excess 0.0 -2.0 - 2.0 mmol/L    tHb 11.6 (L) 12.0 - 16.0 g/dL    J4NW 29.5 (H) 62.1 - 97.0 %    Carboxyhemoglobin 0.6 0.0 - 1.5 %    Methemoglobin 0.3 0.0 - 1.0 %  O2 Saturation 99.0 (H) 92.0 - 98.5 %    a/A Ratio 0.66     ABG AA GRADIENT (ASA) 66.3 mmHg    Patient Temperature 37.0 C    Allen's Test NA     Sample Type Blood     Specimen Site AL     O2 Delivery Device Ventilator     FIO2 35     Ventilator Mode CPAP     Minute Ventilation 14.2     Tidal Volume 570.0 mL    Respiratory Rate 25.0 b/min    PSV 5     Drawn By rn     CPAP 5.0 cmH2O    Puncture Attempts 1     Time Analyzed [id number]     Analyzed By [facility]    POCT Glucose -Next Routine    Collection Time: 02/04/16  1:08 PM   Result Value Ref Range    POC Glucose 128 (H) 80 - 99 mg/dL   POCT Glucose -Next Routine    Collection Time: 02/04/16  2:11 PM   Result Value Ref Range    POC Glucose 116 (H) 80 - 99 mg/dL   POCT Glucose -Next Routine    Collection Time: 02/04/16  3:19 PM   Result Value Ref Range    POC Glucose 99 80 - 99 mg/dL   Renal Function Panel -Next Routine    Collection Time: 02/04/16  3:27 PM   Result Value Ref Range    Albumin 2.6 (L) 3.5 - 5.0 g/dL    Calcium 9.4 8.6 - 53.6 mg/dL    Phosphorus 3.5 2.5 - 4.5 mg/dL    BUN 46 (H) 6 - 23 mg/dL    Creatinine 6.44 (H) 0.55 - 1.10 mg/dL    Sodium 034 (L) 742 - 143 mmol/L    Glucose 99 80 - 99 mg/dL    Potassium 4.7 3.5 - 5.1 mmol/L    Chloride 100 98 - 111 mmol/L    CO2 - Carbon Dioxide 20.9 (L) 21.0 - 31.0 mmol/L    Anion Gap 12.1 (H) 3.0 - 11.0 mmol/L    GFR Estimate 31 (L) >=60 mL/min/1.46m*2    GFR Additional Info     POCT Glucose -Next Routine    Collection Time: 02/04/16  4:17 PM   Result Value Ref Range    POC Glucose 109 (H) 80 - 99 mg/dL   POCT Glucose -Next Routine    Collection Time: 02/04/16  5:05 PM   Result Value Ref Range    POC Glucose 107 (H) 80 - 99 mg/dL   POCT Glucose -Next Routine    Collection Time: 02/04/16   6:08 PM   Result Value Ref Range    POC Glucose 116 (H) 80 - 99 mg/dL   POCT Glucose -Next Routine    Collection Time: 02/04/16  9:01 PM   Result Value Ref Range    POC Glucose 124 (H) 80 - 99 mg/dL   POCT Glucose -Next Routine    Collection Time: 02/05/16 12:01 AM   Result Value Ref Range    POC Glucose 118 (H) 80 - 99 mg/dL   Blood Gas-Arterial -See Comments for Frequency    Collection Time: 02/05/16  3:31 AM   Result Value Ref Range    ABG PH (ASA) 7.419 7.350 - 7.450    PCO2 Arterial Blood Gas 34.4 (L) 35.0 - 45.0 mmHg    pO2 Arterial Blood Gas 118.0 (H) 80.0 - 100.0 mmHg  HCO3 Arterial Blood Gas 21.8 (L) 22.0 - 26.0 mmol/L    Base Excess -2.3 (L) -2.0 - 2.0 mmol/L    tHb 9.4 (L) 12.0 - 16.0 g/dL    Z6XW 96.0 (H) 45.4 - 97.0 %    Carboxyhemoglobin 0.2 0.0 - 1.5 %    Methemoglobin 0.1 0.0 - 1.0 %    O2 Saturation 98.4 92.0 - 98.5 %    a/A Ratio 0.72     ABG AA GRADIENT (ASA) 45.6 mmHg    Patient Temperature 37.0 C    Allen's Test NA     Sample Type Blood     Specimen Site AL     O2 Delivery Device Ventilator     FIO2 30     Ventilator Mode PRVC     Minute Ventilation 11.2     Tidal Volume 590.0 mL    Respiratory Rate 24.0 b/min    Peep 5.0 cmH2O    Drawn By rn     Puncture Attempts 1     Time Analyzed [id number]     Analyzed By Hulda Marin    Basic Metabolic Panel -Daily    Collection Time: 02/05/16  3:36 AM   Result Value Ref Range    Sodium 134 (L) 135 - 143 mmol/L    Potassium 4.3 3.5 - 5.1 mmol/L    Chloride 102 98 - 111 mmol/L    CO2 - Carbon Dioxide 20.9 (L) 21.0 - 31.0 mmol/L    Glucose 126 (H) 80 - 99 mg/dL    BUN 62 (H) 6 - 23 mg/dL    Creatinine 2.13 (H) 0.55 - 1.10 mg/dL    Calcium 9.1 8.6 - 08.6 mg/dL    Anion Gap 57.8 (H) 3.0 - 11.0 mmol/L    GFR Estimate 23 (L) >=60 mL/min/1.84m*2    GFR Additional Info     APTT -Daily    Collection Time: 02/05/16  3:36 AM   Result Value Ref Range    APTT 51 (H) 23 - 36 Seconds   CBC with Auto Differential -Daily    Collection Time: 02/05/16  3:36 AM      Result Value Ref Range    WBC 21.5 (H) 4.8 - 10.8 10*3/?L    RBC 3.63 (L) 4.20 - 5.40 10*6/?L    Hemoglobin 8.3 (L) 12.0 - 16.0 g/dL    HCT 46.9 (L) 62.9 - 48.0 %    MCV 73.8 (L) 81.0 - 99.0 fL    MCH 22.8 (L) 27.0 - 34.0 pg    MCHC 30.9 (L) 32.0 - 36.0 g/dL    RDW 52.8 (H) 41.3 - 14.5 %    Platelet Count 98 (L) 150 - 400 10*3/?L    MPV 8.9 7.4 - 10.4 fL    Neutrophils % 63 35 - 70 %    Lymphocytes % 28 25 - 45 %    Monocytes % 9 0 - 12 %    Eosinophils % 0 0 - 7 %    Basophils % 0 0 - 2 %    Nucleated Red Blood Cells % 1.0 (H) <1.0 %    Neutrophils, Absolute 13.5 (H) 1.6 - 7.3 10*3/?L    Lymphocytes, Absolute 6.0 (H) 1.1 - 4.3 10*3/?L    Monocytes, Absolute 1.9 (H) 0.0 - 1.2 10*3/?L    Eosinophils, Absolute 0.0 0.0 - 0.7 10*3/?L    Basophils, Absolute 0.0 0.0 - 0.2 10*3/?L    Differential Type Manual Differential     Platelet  Estimate Decreased (A) Normal    Anisocytosis 3+ (A) (none)    Hypochromasia 1+ (A) (none)    Microcytes 1+ (A) (none)    Poikilocytosis 1+ (A) (none)   Magnesium -AM Draw    Collection Time: 02/05/16  3:36 AM   Result Value Ref Range    Magnesium 1.9 1.6 - 2.4 mg/dL   POCT Glucose -Next Routine    Collection Time: 02/05/16  3:37 AM   Result Value Ref Range    POC Glucose 132 (H) 80 - 99 mg/dL   POCT Glucose -Next Routine    Collection Time: 02/05/16  7:30 AM   Result Value Ref Range    POC Glucose 110 (H) 80 - 99 mg/dL     Pending Labs     Order Current Status    Blood Gas-Arterial -See Comments for Frequency In process    Extra Tubes (ASA) -Next Routine In process    Red Top -Once In process    Blood Culture -x 2 Preliminary result    Blood Culture -x 2 Preliminary result    Respiratory culture -Next Routine Preliminary result        Recent Imaging:  No results found.     LOS: 13 days     Darrel Hoover  02/05/2016  10:45 AM

## 2016-02-05 NOTE — Progress Notes (Signed)
END OF SHIFT SUMMARY:      SENSORIUM:  Alert and oriented  BS (and any changes with tx):  Clear, diminished  TREATMENTS (or changes in treatment):  Albuterol q4  SUCTIONING (amount, color, tenacity):   Cough - lg amount tk yellow to white, Specimen sent to lab 8/26  ABG: 1634 BIPAP 15/8 30% pH: 7.474    CO2:27.9  PO2:123.9   HCO3:20.0  BE:-2.7   O2 Sat:98%  ABG: 1745 HHFNC 30L/40% pH: 7.468   CO2: 28.5 PO2:  67 HCO3:20.2  BE:-2.7   O2 67.1 Sat:92.8%    XRAY:  8/26IMPRESSION:    ?  1. Stable lines and tubes as above.  2. Increased aeration of the lungs with decreasing RIGHT lung base opacity.    VENTILATOR/BiPAP CHANGES:  Extubated 8/26 @ 1100. Placed on BIPAP at 1515, HHFNC @ 1700, BiPAP again at 2020.  SBT/WEANING (settings, length of trial, toleration):    PROCEDURES:    TRANSPORTS:    ETT ADVANCED/RETRACTED/SECURE:    SKIN INTEGRITY: Intact, gel pad used with BiPAP  GOALS/PLAN:    ADVERSE EVENTS:    COMMENTS:  Pt tachypneic on all devices including ventilator. Pt has strong cough and able to produce lg amount secretions; yellow to white, specimen sent to lab. HR > 150, no albuterol given on 3 - 11 shift. Amiodarone drip initiated. Lasix given.

## 2016-02-05 NOTE — Treatment Summary (Signed)
IP PT TREATMENT NOTE (NOT SEEN)    [name] DOB:  09/20/1955 / 60 y.o. Date:  02/05/2016     ASSESSMENT     No treatment provided.  Brandy [name] was not seen for the following reason: RN request who recommends try PT tomorrow        PLAN     Brandy [name] will remain on the Physical Therapy schedule until goals are met, or there has been a change in the Plan of Care.          Treatment Diagnosis Surgery / # Days Post-op (if applicable)    Primary Dx: Resp failure  Treatment Dx: altered mobility  Procedure(s) (LRB):  AORTIC VALVE REPLACEMENT; INTRA AORTIC BALLOON INSERTION (N/A) / 9 Days Post-Op         OBJECTIVE   NA          Session times   Start/Stop Times: 1545   Total Time: 0 minutes      This note to serve as a Discharge Summary if Brandy [name] is discharged from the hospital or from therapy services.     Therapist:  Mariann Laster, PTA

## 2016-02-05 NOTE — Progress Notes (Signed)
CARDIOTHORACIC SURGERY CRITICAL CARE NOTE     Name: Brandy [name]  DOB: 06-18-1955 60 y.o.  MRN: [id number]  CSN: [id number]    Brandy [name] is a 60 y.o. female patient. Patient was admitted with Respiratory failure (CMS/HCC)  Who had Procedure(s):  AORTIC VALVE REPLACEMENT; INTRA AORTIC BALLOON INSERTION.  9 Days Post-Op    HPI: The patient is a 60 y.o. female with the below medical co-morbid conditions.    ?  1. Severe symptomatic aortic stenosis                        Ejection fraction 30%     Found on TEE intra-op to be 15%                        Normal coronaries                        Predominant symptom dyspnea                     2. Severe Pulmonary Hypertension  3. Mild to Moderate Mitral regurgitation  4. Morbid obesity                        Weight loss 50 pounds in the last one year  5. respiratory insufficiency  6. Non-ST elevation MI                        The demand ischemia  7. Iron deficiency anemia                        CT chest abdomen and pelvis reviewed no abdominal masses                        Likely AVM secondary to aortic stenosis not a good candidate for sedation and EGD or colonoscopy well aortic stenosis is so critical  ?  Patient's a 60 year old female who presented the [facility] falls in respiratory distress unable to breathe.  There are her troponin was elevated she underwent oxygen therapy and was given Lasix.  Cardiac workup showed normal coronary arteries with depressed LV function on echo and severe aortic stenosis valve area less than 0.4 velocity 4.4 m/s with an EF 30%.  She was transfused 2 units of PRBCs A [facility] hospital with a hemoglobin 8.8.  His immobility volume overload from heart failure  ?  Patient hasn't been to the doctor in years she hasn't taken any medications nor does she take good care of herself.  [place] she is unable to consistently take meds.  She likes to use and asked to [employer] wood to heat her house.    ?  After a few  days of Lasix patient states that she was breathing much better she is 60% better per her suggestion.  I had a long talk with the family about the disease course why she was getting short of breath and discussion of heart failure.  They understand that she is given a need aortic valve replacement she wishes to have tissue valve I do think she is a better surgical candidate and have her given her age and risk.  ?  She has very poor dentition    POD #1  Patient has  been stable on balloon 1:2   Methylene blue last night SVR much better   Coronary perfusion good   Today I am managing acute heart failure with management of the IABP.    POD #2  removal of the intra-aortic balloon pump renal ultrasound completed showing minimal flow to the kidneys  patient still making urine  POD #3 CRRT started  POD #4 CRRT running acid base more stable pressures much better off NEO and LEVOPHED improved making urine  POD#5 CRRT continues   Patient plt 25   Will check for DVT   Postoperative day #6 CRRT continues platelets now up to 32 treating for heparin-induced from cytopenia  Postoperative day #7 Trialysis Cath replaced secondary to malfunction.  On agatroban still hemodynamics improving oxygen requirements lower.  Postoperative day #8 making transition to hemodialysis   Postoperative day #9 +12 liters still  Will need HD likely       Subjective: making progress    Assessment & Plan:     Active Hospital Problems    Diagnosis SNOMED CT(R) Date Noted   . Heparin induced thrombocytopenia (CMS/HCC) HEPARIN-INDUCED THROMBOCYTOPENIA 02/02/2016   . S/P AVR (aortic valve replacement) HISTORY OF AORTIC VALVE REPLACEMENT 01/27/2016   . Respiratory failure (CMS/HCC) RESPIRATORY FAILURE 01/23/2016   . Aortic stenosis, severe AORTIC VALVE STENOSIS 01/22/2016   . NSTEMI (non-ST elevated myocardial infarction) (CMS/HCC) ACUTE NON-ST SEGMENT ELEVATION MYOCARDIAL INFARCTION 01/21/2016   . Demand ischemia of myocardium (CMS/HCC) ACUTE ISCHEMIC HEART  DISEASE 01/21/2016   . Iron deficiency anemia IRON DEFICIENCY ANEMIA 01/21/2016   . Obesity OBESITY 01/21/2016     Neurologic: No issues sedated     Cardiac: Pre-op EF:   EF 15% prior to surgery   Post op 30% with good valve function 23mm valve     1. Inotropes off   2. Pressors: Levophed back up to 6 while on propofol sdation          3. Rhythm: AFIB rates better controlled on amiodarone   4. Aspirin: 300 rectal   5. Beta-Blocker: hold   6. Statin: hold   7. ACE/ARB: hold        Respiratory:    Smoker none   Pulmonary toilet    Xray: looks good removing chest tubes today       Gastrointestinal: no BM    Genitourinary: creat: pre op 0.9   Now 1.16 >2.6 up to 3.4>3.26>1.85>1.37>1.25>2.16  OFF CRRT 8/25 in AM    Infectious Disease:  WBC: 17>18> 19.4>16.8>13.5>17.7>17.3    Hematology: HGB 9.8>8.6>7.7>7.5>8.6   PLTS 180> 91>64>32>25>32>31>32>98  Checked hitt  ++++   Stopped all heparin and started agatroban      Endocrine: Glucose management   Electrolytes: replacing Mg/K/Ca  Prophylaxis Agatroban PPI/H2 Blocker     Scheduled Medications:   . albuterol  2.5 mg Nebulization Q4H   . amiodarone  400 mg Oral Q12H   . aspirin  300 mg Rectal Daily   . chlorhexidine  15 mL Mouth/Throat Q12H   . famotidine  20 mg Intravenous Daily   . insulin aspart   Subcutaneous 6 times per day   . [START ON 02/06/2016] levofloxacin (LEVAQUIN) IV  500 mg Intravenous Q48H   . phenylephrine       . phenylephrine       . sodium chloride 0.9 % (NS) syringe  10 mL Intravenous Q8H SCH   . sodium chloride 0.9 % (NS) syringe  5-10 mL Intravenous Daily   . sterile  water (bottle)  30 mL Irrigation Q4H     Infusions:   . amiodarone in D5W Stopped (02/04/16 1241)   . argatroban 0.7 mcg/kg/min (02/05/16 0047)   . clevidipine Stopped (02/02/16 0800)   . dexmedetomidine 1.4 mcg/kg/hr (02/05/16 0533)   . dextrose 10% (D10W)     . dextrose 5% lactated ringers Stopped (01/29/16 1829)   . DOBUTamine (DOBUTREX) 250 mg in D5W 250 mL (1000 mcg/mL) infusion     .  fentanyl 50 mcg/hr (02/05/16 0544)   . insulin (HUMAN R) 150 Units in NaCl 0.9 % 150 mL (1 unit/mL) infusion - cardiac surgery Stopped (02/04/16 1939)   . midazolam Stopped (01/31/16 0900)   . milrinone Stopped (02/02/16 0736)   . nitroglycerin     . norepinephrine 9.5 mcg/min (02/05/16 0533)   . phenylephrine (NEO-SYNEPHRINE) 40 mg in NaCl 0.9 % 500 mL (80 mcg/mL) infusion **DOUBLE CONCENTRATION** Stopped (01/31/16 0515)   . propofol 30 mcg/kg/min (02/05/16 0400)   . vasopressin (PITRESSIN) 50 units in NaCl 0.9 % 250 mL (0.2 units/mL) infusion **ADULT SHOCK** Stopped (01/28/16 1711)     Recent Labs:  Lab Results   Component Value Date    NA 134 (L) 02/05/2016    K 4.3 02/05/2016    K 3.69 01/23/2016    CL 102 02/05/2016    CL 104.1 01/23/2016    CO2 20.9 (L) 02/05/2016    CO2 20 (L) 01/23/2016    GLU 126 (H) 02/05/2016    GLU 85 01/23/2016    BUN 62 (H) 02/05/2016    CREATININES 2.16 (H) 02/05/2016    CALCIUM 9.1 02/05/2016    AST 26 02/04/2016    ALT 10 02/04/2016    ALKPHOS 121 (H) 02/04/2016    BILITOT 0.90 01/21/2016    ALBUMIN 2.6 (L) 02/04/2016    GLOB 3.0 02/04/2016    AGRATIO 0.9 (L) 02/04/2016    ANIONGAP 11.1 (H) 02/05/2016    ANIONGAP 21.0 (H) 01/23/2016    LABGLOM 23 (L) 02/05/2016    LABGLOM >60.0 01/23/2016    WHITEBLOODCE 21.5 (H) 02/05/2016    LABPLAT 98 (L) 02/05/2016    HGB 8.3 (L) 02/05/2016    PROTIME 22.3 (H) 02/03/2016    INR 2.0 (H) 02/03/2016    APTT 51 (H) 02/05/2016          Vital Signs:      ? Cardiac output CCO: 5.1 L/Min    ? Cardiac Index CCI: 2.3 L/min  Temp: 37.3 ?C (99.1 ?F) BP: (!) 93/59 Pulse: 95 Resp: (!) 28 SpO2: 100 % on   Ventilator   Intake/Output:     CT OUTPUT: 165    URINE OUTPUT: 222    Exam:  Neuro: Intubated but follows commands  Cardiac: Atrial fibrillation irregular no murmurs  Lungs;clear  ABDOMEN:abdomen is soft without significant tenderness, masses, organomegaly or guarding  EXTREMITIES 2+ edema in the lower extremities toes appear much better digital ischemia  much improved    PLAN:   Neurologically wakes up when off sedation   Recovering from cardiogenic shock   On argatroban for HITT with thrombosis  Removing chest tubes     If not able to extubate we can do trach Monday     Otho Bellows

## 2016-02-05 NOTE — Progress Notes (Signed)
RESPIRATORY ASSESSMENT    SUBJECTIVE: Pt intubated and sedated.       OBJECTIVE:   Current Vital Signs:    Pre and post C/S: diminished slt coarse at times   HR:  98   RR:  24              Cough:strong productive of  Light tan with pink secretions   SpO2:   99% on 30% fio2   ABG Results: Ph:7.419   , CO2:  34   ,PO2:   118   , HCO3: 21.8  Vent/Bipap Settings: PRVC-20, 480,5 ,30%   CXR: FINDINGS:  The cardiac silhouette and mediastinal contours are unchanged   compared to prior. The opacities within the RIGHT lung have improved since   02/03/2016.    ASSESSMENT:  Pt benefits from bronchodilators with increased aeration.       PLAN: Cont daily sbt's, abgs prn, 2.5 mg albuterol Q 4  per pdp # 102       GOAL(S):  Medication:   Mobilize secretions  Decrease work of breathing  Minimize or eliminate adventitious breath sounds  Maximize airflow      CMV/NIV:  Maintain a patent airway  Normalize and maintain adequate ABG values for patient clinical situation  Prevent or correct atelectasis   Minimize risk of VAP              Supplemental Oxygen:   Maintain SpO2 > 90  %  Normalize and maintain an adequate SpO2/PaO2 appropriate for patient clinical situation    Per PDP # 102 and MD orders    E.S.  Shaneka Efaw, RRT

## 2016-02-06 ENCOUNTER — Inpatient Hospital Stay: Admit: 2016-02-06 | Payer: MEDICAID

## 2016-02-06 LAB — ABG
A-a Gradient: 172.1 mmHg
Base Excess: -2.7 mmol/L — ABNORMAL LOW (ref ?–2.0)
Carboxyhemoglobin: 0.3 % (ref 0.0–1.5)
FIO2: 40
HCO3 Arterial: 20.2 mmol/L — ABNORMAL LOW (ref 22.0–26.0)
Hemoglobin: 9.5 g/dL — ABNORMAL LOW (ref 12.0–16.0)
Methemoglobin: 0.1 % (ref 0.0–1.0)
O2 Liter Flow: 30 L/min
O2Hb: 92.8 % — ABNORMAL LOW (ref 94.0–97.0)
Patient Temperature: 37 C
Puncture Attempts: 1
Time Analyzed: 20170826174612
a/A Ratio: 0.28
pCO2 Arterial: 28.5 mmHg — ABNORMAL LOW (ref 35.0–45.0)
pH Arterial: 7.468 — ABNORMAL HIGH (ref 7.350–7.450)
pO2 Arterial: 67.1 mmHg — ABNORMAL LOW (ref 80.0–100.0)
sO2 Arterial: 93.2 % (ref 92.0–98.5)

## 2016-02-06 LAB — CBC WITH AUTO DIFFERENTIAL
Basophils %: 0 % (ref 0–2)
Basophils, Absolute: 0 10*3/ÂµL (ref 0.0–0.2)
Eosinophils %: 0 % (ref 0–7)
Eosinophils, Absolute: 0 10*3/ÂµL (ref 0.0–0.7)
HCT: 26.8 % — ABNORMAL LOW (ref 37.0–48.0)
Hemoglobin: 8.2 g/dL — ABNORMAL LOW (ref 12.0–16.0)
Lymphocytes %: 5 % — ABNORMAL LOW (ref 25–45)
Lymphocytes, Absolute: 1.1 10*3/ÂµL (ref 1.1–4.3)
MCH: 22.4 pg — ABNORMAL LOW (ref 27.0–34.0)
MCHC: 30.7 g/dL — ABNORMAL LOW (ref 32.0–36.0)
MCV: 73 fL — ABNORMAL LOW (ref 81.0–99.0)
MPV: 8.9 fL (ref 7.4–10.4)
Monocytes %: 4 % (ref 0–12)
Monocytes, Absolute: 0.9 10*3/ÂµL (ref 0.0–1.2)
Neutrophils %: 91 % — ABNORMAL HIGH (ref 35–70)
Neutrophils, Absolute: 19.6 10*3/ÂµL — ABNORMAL HIGH (ref 1.6–7.3)
Platelet Count: 172 10*3/ÂµL (ref 150–400)
Platelet Estimate: NORMAL
RBC: 3.67 10*6/ÂµL — ABNORMAL LOW (ref 4.20–5.40)
RDW: 26.4 % — ABNORMAL HIGH (ref 11.5–14.5)
WBC: 21.5 10*3/ÂµL — ABNORMAL HIGH (ref 4.8–10.8)

## 2016-02-06 LAB — CBC WITHOUT DIFFERENTIAL
HCT: 25 % — ABNORMAL LOW (ref 37.0–48.0)
HCT: 25.1 % — ABNORMAL LOW (ref 37.0–48.0)
HCT: 27.6 % — ABNORMAL LOW (ref 37.0–48.0)
Hemoglobin: 7.8 g/dL — ABNORMAL LOW (ref 12.0–16.0)
Hemoglobin: 7.8 g/dL — ABNORMAL LOW (ref 12.0–16.0)
Hemoglobin: 8.5 g/dL — ABNORMAL LOW (ref 12.0–16.0)
MCH: 22.3 pg — ABNORMAL LOW (ref 27.0–34.0)
MCH: 22.6 pg — ABNORMAL LOW (ref 27.0–34.0)
MCH: 22.7 pg — ABNORMAL LOW (ref 27.0–34.0)
MCHC: 30.7 g/dL — ABNORMAL LOW (ref 32.0–36.0)
MCHC: 31.1 g/dL — ABNORMAL LOW (ref 32.0–36.0)
MCHC: 31.1 g/dL — ABNORMAL LOW (ref 32.0–36.0)
MCV: 72.7 fL — ABNORMAL LOW (ref 81.0–99.0)
MCV: 72.8 fL — ABNORMAL LOW (ref 81.0–99.0)
MCV: 73 fL — ABNORMAL LOW (ref 81.0–99.0)
MPV: 8.2 fL (ref 7.4–10.4)
MPV: 8.3 fL (ref 7.4–10.4)
MPV: 8.9 fL (ref 7.4–10.4)
Platelet Count: 153 10*3/ÂµL (ref 150–400)
Platelet Count: 187 10*3/ÂµL (ref 150–400)
Platelet Count: 207 10*3/ÂµL (ref 150–400)
RBC: 3.43 10*6/ÂµL — ABNORMAL LOW (ref 4.20–5.40)
RBC: 3.44 10*6/ÂµL — ABNORMAL LOW (ref 4.20–5.40)
RBC: 3.8 10*6/ÂµL — ABNORMAL LOW (ref 4.20–5.40)
RDW: 26.2 % — ABNORMAL HIGH (ref 11.5–14.5)
RDW: 26.4 % — ABNORMAL HIGH (ref 11.5–14.5)
RDW: 26.7 % — ABNORMAL HIGH (ref 11.5–14.5)
WBC: 22.8 10*3/ÂµL — ABNORMAL HIGH (ref 4.8–10.8)
WBC: 24.5 10*3/ÂµL — ABNORMAL HIGH (ref 4.8–10.8)
WBC: 25.6 10*3/ÂµL — ABNORMAL HIGH (ref 4.8–10.8)

## 2016-02-06 LAB — RENAL FUNCTION PANEL
Albumin: 2.3 g/dL — ABNORMAL LOW (ref 3.5–5.0)
Anion Gap: 13.1 mmol/L — ABNORMAL HIGH (ref 3.0–11.0)
BUN: 84 mg/dL — ABNORMAL HIGH (ref 6–23)
CO2 - Carbon Dioxide: 19.9 mmol/L — ABNORMAL LOW (ref 21.0–31.0)
Calcium: 9.7 mg/dL (ref 8.6–10.3)
Chloride: 101 mmol/L (ref 98–111)
Creatinine: 2.76 mg/dL — ABNORMAL HIGH (ref 0.55–1.10)
GFR Estimate: 18 mL/min/{1.73_m2} — ABNORMAL LOW (ref >=60)
Glucose: 109 mg/dL — ABNORMAL HIGH (ref 80–99)
Phosphorus: 4.5 mg/dL (ref 2.5–4.5)
Potassium: 4.7 mmol/L (ref 3.5–5.1)
Sodium: 134 mmol/L — ABNORMAL LOW (ref 135–143)

## 2016-02-06 LAB — POCT GLUCOSE
POC Glucose: 101 mg/dL — ABNORMAL HIGH (ref 80–99)
POC Glucose: 103 mg/dL — ABNORMAL HIGH (ref 80–99)
POC Glucose: 105 mg/dL — ABNORMAL HIGH (ref 80–99)
POC Glucose: 106 mg/dL — ABNORMAL HIGH (ref 80–99)
POC Glucose: 113 mg/dL — ABNORMAL HIGH (ref 80–99)

## 2016-02-06 LAB — MAGNESIUM
Magnesium: 1.8 mg/dL (ref 1.6–2.4)
Magnesium: 1.9 mg/dL (ref 1.6–2.4)
Magnesium: 2.1 mg/dL (ref 1.6–2.4)

## 2016-02-06 LAB — IONIZED CALCIUM: Ionized Calcium: 1.34 mmol/L — ABNORMAL HIGH (ref 1.12–1.30)

## 2016-02-06 LAB — APTT: APTT: 53 Seconds — ABNORMAL HIGH (ref 23–36)

## 2016-02-06 LAB — RESPIRATORY CULTURE: Culture Result: 1 — AB

## 2016-02-06 LAB — POTASSIUM: Potassium: 4.7 mmol/L (ref 3.5–5.1)

## 2016-02-06 MED ORDER — furosemide (LASIX) injection 80 mg
10 | Freq: Two times a day (BID) | INTRAMUSCULAR | Status: DC
Start: 2016-02-06 — End: 2016-02-07
  Administered 2016-02-06 – 2016-02-07 (×2): 10 mg via INTRAVENOUS

## 2016-02-06 MED ORDER — amiodarone (PACERONE) tablet 400 mg
200 | Freq: Two times a day (BID) | ORAL | Status: DC
Start: 2016-02-06 — End: 2016-02-06

## 2016-02-06 MED ORDER — amiodarone 360 mg in D5W 200mL (1.8 mg/mL) premix infusion
360 | INTRAVENOUS | Status: DC
Start: 2016-02-06 — End: 2016-02-09
  Administered 2016-02-06: 23:00:00 360 mg/min via INTRAVENOUS
  Administered 2016-02-07 (×2): 3602001.8 mg/min via INTRAVENOUS
  Administered 2016-02-08: 11:00:00 360 mg/min via INTRAVENOUS
  Administered 2016-02-08: 20:00:00 3602001.8 mg/min via INTRAVENOUS
  Administered 2016-02-08: 22:00:00 360 mg/min via INTRAVENOUS
  Administered 2016-02-09: 22:00:00 3602001.8 mg/min via INTRAVENOUS
  Administered 2016-02-09: 21:00:00 360 mg/min via INTRAVENOUS
  Administered 2016-02-09: 15:00:00 3602001.8 mg/min via INTRAVENOUS
  Administered 2016-02-09: 10:00:00 360 mg/min via INTRAVENOUS
  Administered 2016-02-10: 21:00:00 3602001.8 mg/min via INTRAVENOUS

## 2016-02-06 MED FILL — MORPHINE 4 MG/ML INTRAVENOUS CARTRIDGE: 4 mg/mL | INTRAVENOUS | Qty: 1

## 2016-02-06 MED FILL — NEXTERONE 360 MG/200 ML (1.8 MG/ML) INTRAVENOUS SOLUTION: 360 mg/200 mL (1.8 mg/mL) | INTRAVENOUS | Qty: 360

## 2016-02-06 MED FILL — MAGNESIUM SULFATE 1 GRAM/100 ML IN DEXTROSE 5 % INTRAVENOUS PIGGYBACK: 1 gram/100 mL | INTRAVENOUS | Qty: 100

## 2016-02-06 MED FILL — ARGATROBAN 50 MG/50 ML (1 MG/ML) IN SODIUM CHLORIDE (ISO-OSMOTIC) IV: 50 mg/50 mL (1 mg/mL) | INTRAVENOUS | Qty: 50

## 2016-02-06 MED FILL — FUROSEMIDE 10 MG/ML INJECTION SOLUTION: 10 mg/mL | INTRAMUSCULAR | Qty: 8

## 2016-02-06 MED FILL — PROTONIX 40 MG INTRAVENOUS SOLUTION: 40 mg | INTRAVENOUS | Qty: 40

## 2016-02-06 MED FILL — MORPHINE 2 MG/ML INTRAVENOUS CARTRIDGE: 2 mg/mL | INTRAVENOUS | Qty: 2

## 2016-02-06 MED FILL — MORPHINE 4 MG/ML INTRAVENOUS CARTRIDGE: 4 mg/mL | INTRAVENOUS | Qty: 2

## 2016-02-06 MED FILL — LEVOFLOXACIN IN D5W 500 MG/100 ML IVPB PREMIX: 500 mg/dL | INTRAVENOUS | Qty: 100

## 2016-02-06 NOTE — Treatment Summary (Signed)
IP PT TREATMENT NOTE     [name] / [birth year]-01 DOB:  12/31/1955 / 60 y.o. Date:  02/06/2016     Precautions  Specific mobility precautions: Sternal precautions  Sternal precautions: Standard     ASSESSMENT     Summary:  Pt now extubated on HHFLNC. Co treat with ST for swallow assessment. Max/dep to transfer to sitting EOB, dangling with feet supported. Bil feet painful and cool to touch, slightly dusky. Pt intermittently able to activate trunk for sitting balance but did need min assist to maintain balance.Max/Dep transfer to return to supine. Tolerated sitting aaobut 5 min. Very fatigued but able to follow direction with cues. Pt very debilitated from her acute illness, moves all extremities but is very weak. Anticipate need for rehab, most likely SNF but would not r/o poss [name] pending her progress and activity tolerance.      Activity tolerance: Tolerates 10 - 20 min activity  Comments: Fatigues very easily; 5 min sitting and TEX     Precaution Awareness: Decreased awareness of precaution(s)  Deficit Awareness: Decreased awareness of deficits  Correction of Errors: Self-corrects with cues  Safety Judgment: Decreased awareness of bodily and/or fall prevention safety       Discharge recommendations  Mobility Equipment needs: To be determined later   Primary recommendation : Too early to determine post-discharge recommendations.      PLAN     Treatment Plan Frequency    Plan: Continue per established POC.  Pt. demonstrates limitations which require skilled PT intervention.  1x (5 days/week).  Frequency will be increased as pt's condition or discharge plan indicates.     Brandy [name] will remain on the Physical Therapy schedule until goals are met, or there has been a change in the Plan of Care.          Treatment Diagnosis Surgery / # Days Post-op (if applicable)    Primary Dx: Resp failure  Treatment Dx: altered mobility  Procedure(s) (LRB):  AORTIC VALVE REPLACEMENT; INTRA AORTIC BALLOON  INSERTION (N/A) / 10 Days Post-Op       SUBJECTIVE   My feet hurt  Did I do OK?  RN present and agreeable for therapy, to sit pt EOB.     Pain Level  Current status: Pain somewhat limits pt's ability to participate in therapy  Pain at end of session: Unable to assess  Pain at rest: Unable to assess  Pain location: Foot (Bilaterall)  Wong-Baker FACES: 6 - Hurts even more     OBJECTIVE   Co treat with ST. Transferred to sit EOB, ST performing swallow assessment.  Pt tolerated sitting about 5 min but was fatigued at end. HR fluctuating 120-130 with peak at 140. RN present through out session and aware. AAROM all extremities once returned to supine.     Mental status  General Demeanor: Pleasant;Cooperative;Confused  LOC: Alert  Orientation: Oriented to:  Oriented to: Person  Directions: Follows one-step commands  --> Follows one-step commands: With repetition  Attention span: Attends, but requires cues to redirect  Memory: Decreased recall of precautions;Decreased recall of biographical information;Decreased recall of current events;Decreased short term memory   Medical appliances  Medical Appliances: IV;Heated high flow nasal cannula;Arterial line - radial;Catheter;Rectal Tube/[employer];ECG;BP monitor;SPO2 monitor   Vitals    HR fluctuating 120-130; HR spike to 140 with initial sitting.       TREATMENT  Bed Mobility/Transfers  Bed mobility       Rolling / logroll  Transition to sitting up Supine to Sit: Dependent;Max Assist;Tech required for balance/safety;Verbal cues   To resting position      Transition to sitting down       Transition to supine Dependent;Max Assist;Tech required for balance/safety     Scooting     Transfers      Transition to standing       Method          Ther. Ex.  UE exercises:: AAROM shoulder elbow wrist fingers  LE exercises: AAROM;Ankle pumps;Heelslides;Hip abd/adduction  Ankle pumps: 10  Heelslides: 5  Hip abd/adduction: 5    Balance  Balance - Basic  Static sitting balance: Assist  level;Position;Time  Assist level: Min assist  Position: Feet - supported;UE support - bilateral  Time: 5+ min   Training/Education  Trainee(s): Primary Learner  Primary Learner: Patient  Primary Learner - Barriers to Learning?: Yes  Primary Learner - Specific barriers: Physical;Cognitive;Emotional  Training provided: Benefits of sitting; ROM  Response to training: receptive but poor recall, will need reinforcement   Safety/Room Set-up   Call button accessible?: Yes  Phone accessible?: No  Oxygen reconnected?: No, not disconnected during therapy  Position on arrival: In bed  Position on departure (bed): 4 bedrails up, as per patient status on presentation*  Comments: BAck to bed with call light     GOALS     Patient/Caregiver goals reviewed and integrated with rehab treatment plan:     Multidisciplinary Problems (Active)        Problem: Mobility: Ambulation    Goal Priority Disciplines Outcome   Gait (with precautions)     PT    Description:  Pt. to ambulate 50 feet with one person min assist, using FWW, following any pertinent precautions.    Gait Soil scientist)     PT    Description:               Problem: Mobility: Transfers    Goal Priority Disciplines Outcome   Supine to/from sitting (with precautions)     PT    Description:  Pt. to transfer to/from sitting with one person min assist,  following any pertinent precautions.    Sitting to/from standing (with precautions)     PT    Description:  Pt. to transfer sitting to/from standing with one person min assist, following any pertinent precautions.    Bed to/from chair (with precautions)     PT    Description:  Pt. to transfer bed to/from chair with one person min assist, following any pertinent precautions.           Problem: Patient/Family Goal    Goal Priority Disciplines Outcome   Normal Status     PT    Description:  Pt. wants to return to previous level of function.     Mobility     PT    Description:  Patient wants to walk again.           Problem: Strength     Goal Priority Disciplines Outcome   Home Exercise Program (with precautions)     PT    Description:  Pt. to perform basic HEP independently following any pertinent precautions.                       First session Second session (if applicable)    Start/Stop times: 1320 - 1357 Start/Stop times:   - Stop Time:     Total time: 37 minutes  Total  time:   minutes     This note to serve as a Discharge Summary if Brandy [name] is discharged from the hospital or from therapy services.     Therapist:  Penelope Coop, PT

## 2016-02-06 NOTE — Progress Notes (Signed)
Daily Progress Note     Name: Brandy [name]  DOB: Jul 20, 1955 60 y.o.  MRN: [id number]  CSN: [id number]    Assessment & Plan:     Active Hospital Problems    Diagnosis SNOMED CT(R) Date Noted   . Heparin induced thrombocytopenia (CMS/HCC) HEPARIN-INDUCED THROMBOCYTOPENIA 02/02/2016   . S/P AVR (aortic valve replacement) HISTORY OF AORTIC VALVE REPLACEMENT 01/27/2016   . Respiratory failure (CMS/HCC) RESPIRATORY FAILURE 01/23/2016   . Aortic stenosis, severe AORTIC VALVE STENOSIS 01/22/2016   . NSTEMI (non-ST elevated myocardial infarction) (CMS/HCC) ACUTE NON-ST SEGMENT ELEVATION MYOCARDIAL INFARCTION 01/21/2016   . Demand ischemia of myocardium (CMS/HCC) ACUTE ISCHEMIC HEART DISEASE 01/21/2016   . Iron deficiency anemia IRON DEFICIENCY ANEMIA 01/21/2016   . Obesity OBESITY 01/21/2016     --AKI: ATN secondary to cardiogenic shock and possible sepsis from nosocomial pneumonia.  CRRT discontinued 8/25. due to high return pressures.   Volume status improved but remains edematous.    Hemodynamics seems to be improving.  Urine output significant improvement.  Creatinine 2.1-2.7    --Pneumonia: On Levaquin, now afebrile.    Persistent leukocytosis noted  --S/P AVR:  Complicated by cardiogenic shock.  Hemodynamics improving.    --A-fib with RVR: Started on amiodarone.   --Heparin Induced Thrombcytopenia. Improved  --DVT:  On agatroban  --Microcytic iron deficient anemia  --Nutrition on tube feedings    Plan:   --Continue Lasix although if urine output tremendous will back off  --Hold on dialysis today  --Daily assessment for possible dialysis    --Medications reviewed  --Intravenous iron when pneumonia controlled  --Following closely    Subjective:     Brandy [name] is a 60 y.o. female patient. Patient was admitted with critical AS s/p AVR complicated by cardiogenic shock/AKI.  CRRT was required and then discontinued 8/25 due to high return pressures. Patient has not been dialyzed last 48 hours.   Remains on  amiodarone for A-fib.  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   Now extubated and off pressor.    Urine output  significantly improved to 2710 cc yesterday stimulated by Lasix.    She tracks and attempts to verbalize but is confused.      Scheduled Medications:   . albuterol  2.5 mg Nebulization Q4H   . amiodarone  400 mg Oral 2 times per day   . aspirin  300 mg Rectal Daily   . furosemide  80 mg Intravenous Q8H   . insulin aspart   Subcutaneous 6 times per day   . levofloxacin (LEVAQUIN) IV  500 mg Intravenous Q48H   . pantoprazole  40 mg Intravenous 2 times per day   . phenylephrine       . phenylephrine       . sodium chloride 0.9 % (NS) syringe  10 mL Intravenous Q8H SCH   . sodium chloride 0.9 % (NS) syringe  5-10 mL Intravenous Daily     Infusions:   . amiodarone in D5W 0.5 mg/min (02/06/16 0618)   . amiodarone in D5W 0.5 mg/min (02/06/16 0607)   . argatroban 0.7 mcg/kg/min (02/06/16 9562)   . clevidipine Stopped (02/02/16 0800)   . dexmedetomidine Stopped (02/05/16 1402)   . dextrose 10% (D10W)     . dextrose 5% lactated ringers Stopped (01/29/16 1829)   . DOBUTamine (DOBUTREX) 250 mg in D5W 250 mL (1000 mcg/mL) infusion     . fentanyl Stopped (02/05/16 1402)   . midazolam Stopped (01/31/16 0900)   .  milrinone Stopped (02/02/16 0736)   . nitroglycerin     . norepinephrine Stopped (02/05/16 1203)   . phenylephrine (NEO-SYNEPHRINE) 40 mg in NaCl 0.9 % 500 mL (80 mcg/mL) infusion **DOUBLE CONCENTRATION** Stopped (01/31/16 0515)   . propofol Stopped (02/05/16 0910)   . vasopressin (PITRESSIN) 50 units in NaCl 0.9 % 250 mL (0.2 units/mL) infusion **ADULT SHOCK** Stopped (01/28/16 1711)     PRN Medications: acetaminophen (TYLENOL) suppository, acetaminophen, amiodarone in D5W, amiodarone in D5W, anticoagulant citrate dextrose solution A, calcium gluconate IVPB, clevidipine, dextrose 5% lactated ringers, dextrose, DOBUTamine (DOBUTREX) 250 mg in D5W 250 mL (1000 mcg/mL) infusion, EPINEPHrine, fentanyl, hydrALAZINE,  HYDROcodone-acetaminophen, magnesium sulfate IVPB, magnesium sulfate IVPB, meperidine (PF), midazolam, milrinone, morphine, nitroglycerin, norepinephrine, oxyCODONE, phenylephrine (NEO-SYNEPHRINE) 40 mg in NaCl 0.9 % 500 mL (80 mcg/mL) infusion **DOUBLE CONCENTRATION**, propofol, protamine, sodium chloride, sodium chloride 0.9 % (NS) syringe, sodium chloride 0.9 % (NS) syringe, sterile water (bottle)    Objective:     Vital Signs:    Vital Sign Ranges for Last 24 Hours:  BP  Min: 87/50  Max: 146/66  Temp  Min: 36.8 ?C (98.2 ?F)  Max: 37.4 ?C (99.3 ?F)  Pulse  Min: 108  Max: 156  Resp  Min: 21  Max: 40  SpO2  Min: 95 %  Max: 100 %    Most Recent Vitals  BP: 113/89  Pulse: 121  Resp: 24  Temp: 37.4 ?C (99.3 ?F)  SpO2: 98 % (08/27 0400-08/27 1000)    Intake/Ouput:    Intake/Output Summary (Last 24 hours) at 02/06/16 1022  Last data filed at 02/06/16 1000   Gross per 24 hour   Intake           1698.6 ml   Output             3430 ml   Net          -1731.4 ml        Exam:  Now extubated, eyes open, alert, attempts to verbalize confused  RIJ CVC  Coarse UA BS  Atrial fibrillation rate 115  Abd: Obese, nontender  Ext: 2+ edema      Recent Labs:  Results for orders placed or performed during the hospital encounter of 01/23/16 (from the past 24 hour(s))   Occult blood, stool(DIAGNOSTIC) -Daily    Collection Time: 02/05/16 11:49 AM   Result Value Ref Range    Occult Blood, Stool Positive (A) Negative   Blood Gas-Arterial -See Comments for Frequency    Collection Time: 02/05/16 11:53 AM   Result Value Ref Range    ABG PH (ASA) 7.432 7.350 - 7.450    PCO2 Arterial Blood Gas 31.2 (L) 35.0 - 45.0 mmHg    pO2 Arterial Blood Gas 119.3 (H) 80.0 - 100.0 mmHg    HCO3 Arterial Blood Gas 20.3 (L) 22.0 - 26.0 mmol/L    Base Excess -3.3 (L) -2.0 - 2.0 mmol/L    tHb 9.4 (L) 12.0 - 16.0 g/dL    J8JX 91.4 (H) 78.2 - 97.0 %    Carboxyhemoglobin 0.4 0.0 - 1.5 %    Methemoglobin 0.1 0.0 - 1.0 %    O2 Saturation 98.4 92.0 - 98.5 %    pH (Temp  Corrected) - Arterial      pCO2 (Temp Corrected) - Arterial  mmHg    pO2 (Temp Corrected) - Arterial  mmHg    Allen's Test NA     Sample Type Blood  Specimen Site AL     O2 Delivery Device vent     Ventilator Mode CPAP     Minute Ventilation 16.6     Tidal Volume 470.0 mL    Respiratory Rate 33.0 b/min    Peep 5.0 cmH2O    Drawn By rn     CPAP 5.0 cmH2O    Puncture Attempts na     Time Analyzed [id number]     Analyzed By Horald Pollen    POCT Glucose -Next Routine    Collection Time: 02/05/16 11:54 AM   Result Value Ref Range    POC Glucose 93 80 - 99 mg/dL   CBC without Differential -STAT    Collection Time: 02/05/16 12:57 PM   Result Value Ref Range    WBC 22.6 (H) 4.8 - 10.8 10*3/?L    RBC 3.71 (L) 4.20 - 5.40 10*6/?L    Hemoglobin 8.3 (L) 12.0 - 16.0 g/dL    HCT 01.0 (L) 27.2 - 48.0 %    MCV 74.0 (L) 81.0 - 99.0 fL    MCH 22.2 (L) 27.0 - 34.0 pg    MCHC 30.0 (L) 32.0 - 36.0 g/dL    RDW 53.6 (H) 64.4 - 14.5 %    Platelet Count 112 (L) 150 - 400 10*3/?L    MPV 8.8 7.4 - 10.4 fL   ABO/Rh -Once    Collection Time: 02/05/16 12:57 PM   Result Value Ref Range    ABO O     Rh (D) Positive    Antibody Screen -Once    Collection Time: 02/05/16 12:57 PM   Result Value Ref Range    Antibody Screen Negative    POCT Glucose -Next Routine    Collection Time: 02/05/16  3:56 PM   Result Value Ref Range    POC Glucose 107 (H) 80 - 99 mg/dL   Blood Gas-Arterial -See Comments for Frequency    Collection Time: 02/05/16  4:34 PM   Result Value Ref Range    ABG PH (ASA) 7.474 (H) 7.350 - 7.450    PCO2 Arterial Blood Gas 27.9 (L) 35.0 - 45.0 mmHg    pO2 Arterial Blood Gas 123.9 (H) 80.0 - 100.0 mmHg    HCO3 Arterial Blood Gas 20.0 (L) 22.0 - 26.0 mmol/L    Base Excess -2.7 (L) -2.0 - 2.0 mmol/L    tHb 9.7 (L) 12.0 - 16.0 g/dL    I3KV 42.5 (H) 95.6 - 97.0 %    Carboxyhemoglobin 0.5 0.0 - 1.5 %    Methemoglobin 0.0 0.0 - 1.0 %    O2 Saturation 98.5 92.0 - 98.5 %    a/A Ratio 0.72     ABG AA GRADIENT (ASA) 47.3 mmHg    Patient  Temperature 37.0 C    Allen's Test NA     Sample Type Blood     Specimen Site AL     O2 Delivery Device BiPAP     FIO2 30     Ventilator Mode BiPAP     Tidal Volume 560.0 mL    Respiratory Rate 16.0 b/min    PSV 7     Drawn By nurse     Reported By BK     Reported to Blumhardt, MD, M.     Results called/Readback Hand Delivered     Puncture Attempts 1     Time Analyzed [id number]     Analyzed By Effie Berkshire     Blood Gas Comment Total RR 32  BIPAP 15/8    CBC without Differential -Q6H    Collection Time: 02/05/16  4:37 PM   Result Value Ref Range    WBC 27.3 (H) 4.8 - 10.8 10*3/?L    RBC 3.81 (L) 4.20 - 5.40 10*6/?L    Hemoglobin 8.6 (L) 12.0 - 16.0 g/dL    HCT 16.1 (L) 09.6 - 48.0 %    MCV 73.3 (L) 81.0 - 99.0 fL    MCH 22.5 (L) 27.0 - 34.0 pg    MCHC 30.7 (L) 32.0 - 36.0 g/dL    RDW 04.5 (H) 40.9 - 14.5 %    Platelet Count 130 (L) 150 - 400 10*3/?L    MPV 8.6 7.4 - 10.4 fL   Blood Gas-Arterial -PRN    Collection Time: 02/05/16  5:45 PM   Result Value Ref Range    ABG PH (ASA) 7.468 (H) 7.350 - 7.450    PCO2 Arterial Blood Gas 28.5 (L) 35.0 - 45.0 mmHg    pO2 Arterial Blood Gas 67.1 (L) 80.0 - 100.0 mmHg    HCO3 Arterial Blood Gas 20.2 (L) 22.0 - 26.0 mmol/L    Base Excess -2.7 (L) -2.0 - 2.0 mmol/L    tHb 9.5 (L) 12.0 - 16.0 g/dL    W1XB 14.7 (L) 82.9 - 97.0 %    Carboxyhemoglobin 0.3 0.0 - 1.5 %    Methemoglobin 0.1 0.0 - 1.0 %    O2 Saturation 93.2 92.0 - 98.5 %    a/A Ratio 0.28     ABG AA GRADIENT (ASA) 172.1 mmHg    Patient Temperature 37.0 C    Allen's Test NA     Sample Type Blood     Specimen Site AL     O2 Delivery Device Heated High Flow Humidity     O2 Liter Flow 30.00 L/min    FIO2 40     Drawn By nurse     Reported to [place], MD, M.     Puncture Attempts 1     Time Analyzed [id number]     Analyzed By Effie Berkshire    Respiratory culture -Next Routine    Collection Time: 02/05/16  5:57 PM   Result Value Ref Range    Gram Stain 4+ Polymorphonuclear leukocytes     Gram Stain Rare Epithelial  Cells     Gram Stain Rare Yeast    Potassium -STAT    Collection Time: 02/05/16  6:16 PM   Result Value Ref Range    Potassium 4.7 3.5 - 5.1 mmol/L   Ionized Calcium -STAT    Collection Time: 02/05/16  6:16 PM   Result Value Ref Range    Ionized Calcium 1.34 (H) 1.12 - 1.30 mmol/L   Magnesium -STAT    Collection Time: 02/05/16  6:16 PM   Result Value Ref Range    Magnesium 1.8 1.6 - 2.4 mg/dL   POCT Glucose -Next Routine    Collection Time: 02/05/16  8:36 PM   Result Value Ref Range    POC Glucose 105 (H) 80 - 99 mg/dL   CBC without Differential -Q6H    Collection Time: 02/05/16 10:32 PM   Result Value Ref Range    WBC 25.6 (H) 4.8 - 10.8 10*3/?L    RBC 3.80 (L) 4.20 - 5.40 10*6/?L    Hemoglobin 8.5 (L) 12.0 - 16.0 g/dL    HCT 62.9 (L) 52.8 - 48.0 %    MCV 72.8 (L) 81.0 - 99.0 fL  MCH 22.3 (L) 27.0 - 34.0 pg    MCHC 30.7 (L) 32.0 - 36.0 g/dL    RDW 16.1 (H) 09.6 - 14.5 %    Platelet Count 153 150 - 400 10*3/?L    MPV 8.9 7.4 - 10.4 fL   POCT Glucose -Next Routine    Collection Time: 02/06/16 12:25 AM   Result Value Ref Range    POC Glucose 113 (H) 80 - 99 mg/dL   APTT -Daily    Collection Time: 02/06/16  3:43 AM   Result Value Ref Range    APTT 53 (H) 23 - 36 Seconds   CBC with Auto Differential -Daily    Collection Time: 02/06/16  3:43 AM   Result Value Ref Range    WBC 21.5 (H) 4.8 - 10.8 10*3/?L    RBC 3.67 (L) 4.20 - 5.40 10*6/?L    Hemoglobin 8.2 (L) 12.0 - 16.0 g/dL    HCT 04.5 (L) 40.9 - 48.0 %    MCV 73.0 (L) 81.0 - 99.0 fL    MCH 22.4 (L) 27.0 - 34.0 pg    MCHC 30.7 (L) 32.0 - 36.0 g/dL    RDW 81.1 (H) 91.4 - 14.5 %    Platelet Count 172 150 - 400 10*3/?L    MPV 8.9 7.4 - 10.4 fL    Neutrophils % 91 (H) 35 - 70 %    Lymphocytes % 5 (L) 25 - 45 %    Monocytes % 4 0 - 12 %    Eosinophils % 0 0 - 7 %    Basophils % 0 0 - 2 %    Neutrophils, Absolute 19.6 (H) 1.6 - 7.3 10*3/?L    Lymphocytes, Absolute 1.1 1.1 - 4.3 10*3/?L    Monocytes, Absolute 0.9 0.0 - 1.2 10*3/?L    Eosinophils, Absolute 0.0 0.0 - 0.7  10*3/?L    Basophils, Absolute 0.0 0.0 - 0.2 10*3/?L    Differential Type Manual Differential     Platelet Estimate Normal Normal    Acanthocytes 3+ (A) (none)    Hypochromasia 1+ (A) (none)    Microcytes 2+ (A) (none)   Renal Function Panel -Daily    Collection Time: 02/06/16  3:43 AM   Result Value Ref Range    Albumin 2.3 (L) 3.5 - 5.0 g/dL    Calcium 9.7 8.6 - 78.2 mg/dL    Phosphorus 4.5 2.5 - 4.5 mg/dL    BUN 84 (H) 6 - 23 mg/dL    Creatinine 9.56 (H) 0.55 - 1.10 mg/dL    Sodium 213 (L) 086 - 143 mmol/L    Glucose 109 (H) 80 - 99 mg/dL    Potassium 4.7 3.5 - 5.1 mmol/L    Chloride 101 98 - 111 mmol/L    CO2 - Carbon Dioxide 19.9 (L) 21.0 - 31.0 mmol/L    Anion Gap 13.1 (H) 3.0 - 11.0 mmol/L    GFR Estimate 18 (L) >=60 mL/min/1.53m*2    GFR Additional Info     Magnesium -Conditional    Collection Time: 02/06/16  3:43 AM   Result Value Ref Range    Magnesium 1.9 1.6 - 2.4 mg/dL   POCT Glucose -Next Routine    Collection Time: 02/06/16  8:11 AM   Result Value Ref Range    POC Glucose 103 (H) 80 - 99 mg/dL     Pending Labs     Order Current Status    Blood Gas-Arterial -See Comments for Frequency In process  Extra Tubes (ASA) -Next Routine In process    Red Top -Once In process    Blood Culture -x 2 Preliminary result    Blood Culture -x 2 Preliminary result    Respiratory culture -Next Routine Preliminary result        Recent Imaging:  No results found.     LOS: 14 days     Darrel Hoover  02/06/2016  10:22 AM

## 2016-02-06 NOTE — Progress Notes (Signed)
CARDIOTHORACIC SURGERY CRITICAL CARE NOTE     Name: Brandy [name]  DOB: August 07, 1955 60 y.o.  MRN: [id number]  CSN: [id number]    Brandy [name] is a 60 y.o. female patient. Patient was admitted with Respiratory failure (CMS/HCC)  Who had Procedure(s):  AORTIC VALVE REPLACEMENT; INTRA AORTIC BALLOON INSERTION.  10 Days Post-Op    HPI: The patient is a 60 y.o. female with the below medical co-morbid conditions.    ?  1. Severe symptomatic aortic stenosis                        Ejection fraction 30%     Found on TEE intra-op to be 15%                        Normal coronaries                        Predominant symptom dyspnea                     2. Severe Pulmonary Hypertension  3. Mild to Moderate Mitral regurgitation  4. Morbid obesity                        Weight loss 50 pounds in the last one year  5. respiratory insufficiency  6. Non-ST elevation MI                        The demand ischemia  7. Iron deficiency anemia                        CT chest abdomen and pelvis reviewed no abdominal masses                        Likely AVM secondary to aortic stenosis not a good candidate for sedation and EGD or colonoscopy well aortic stenosis is so critical  ?  Patient's a 60 year old female who presented the [facility] falls in respiratory distress unable to breathe.  There are her troponin was elevated she underwent oxygen therapy and was given Lasix.  Cardiac workup showed normal coronary arteries with depressed LV function on echo and severe aortic stenosis valve area less than 0.4 velocity 4.4 m/s with an EF 30%.  She was transfused 2 units of PRBCs A [facility] hospital with a hemoglobin 8.8.  His immobility volume overload from heart failure  ?  Patient hasn't been to the doctor in years she hasn't taken any medications nor does she take good care of herself.  [place] she is unable to consistently take meds.  She likes to use and asked to [employer] wood to heat her house.    ?  After a few  days of Lasix patient states that she was breathing much better she is 60% better per her suggestion.  I had a long talk with the family about the disease course why she was getting short of breath and discussion of heart failure.  They understand that she is given a need aortic valve replacement she wishes to have tissue valve I do think she is a better surgical candidate and have her given her age and risk.  ?  She has very poor dentition    POD #1  Patient has  been stable on balloon 1:2   Methylene blue last night SVR much better   Coronary perfusion good   Today I am managing acute heart failure with management of the IABP.    POD #2  removal of the intra-aortic balloon pump renal ultrasound completed showing minimal flow to the kidneys  patient still making urine  POD #3 CRRT started  POD #4 CRRT running acid base more stable pressures much better off NEO and LEVOPHED improved making urine  POD#5 CRRT continues   Patient plt 25   Will check for DVT   Postoperative day #6 CRRT continues platelets now up to 32 treating for heparin-induced from cytopenia  Postoperative day #7 Trialysis Cath replaced secondary to malfunction.  On agatroban still hemodynamics improving oxygen requirements lower.  Postoperative day #8 making transition to hemodialysis   Postoperative day #9 +12 liters still  Will need HD likely     POD #10:  11.8 L positive. Planning hemodialysis today.  remains in A. Fib on the amiodarone protocol.          Subjective: making progress    Assessment & Plan:     Active Hospital Problems    Diagnosis SNOMED CT(R) Date Noted   . Heparin induced thrombocytopenia (CMS/HCC) HEPARIN-INDUCED THROMBOCYTOPENIA 02/02/2016   . S/P AVR (aortic valve replacement) HISTORY OF AORTIC VALVE REPLACEMENT 01/27/2016   . Respiratory failure (CMS/HCC) RESPIRATORY FAILURE 01/23/2016   . Aortic stenosis, severe AORTIC VALVE STENOSIS 01/22/2016   . NSTEMI (non-ST elevated myocardial infarction) (CMS/HCC) ACUTE NON-ST SEGMENT  ELEVATION MYOCARDIAL INFARCTION 01/21/2016   . Demand ischemia of myocardium (CMS/HCC) ACUTE ISCHEMIC HEART DISEASE 01/21/2016   . Iron deficiency anemia IRON DEFICIENCY ANEMIA 01/21/2016   . Obesity OBESITY 01/21/2016     Neurologic: No issues sedated     Cardiac: Pre-op EF:   EF 15% prior to surgery   Post op 30% with good valve function 23mm valve     1. Inotropes off   2. Pressors: Levophed back up to 6 while on propofol sdation          3. Rhythm: AFIB rates better controlled on amiodarone   4. Aspirin: 300 rectal   5. Beta-Blocker: hold   6. Statin: hold   7. ACE/ARB: hold        Respiratory:    Smoker none   Pulmonary toilet    Xray: looks good removing chest tubes today       Gastrointestinal: no BM    Genitourinary: creat: pre op 0.9   Now 1.16 >2.6 up to 3.4>3.26>1.85>1.37>1.25>2.16>2.76  OFF CRRT 8/25 in AM    Infectious Disease:  WBC: 17>18> 19.4>16.8>13.5>17.7>17.3>21.5    Hematology: HGB 9.8>8.6>7.7>7.5>8.6 >8.2  PLTS 180> 91>64>32>25>32>31>32>98>172  Checked hitt  ++++   Stopped all heparin and started agatroban      Endocrine: Glucose management   Electrolytes: replacing Mg/K/Ca  Prophylaxis Agatroban PPI/H2 Blocker     Scheduled Medications:   . albuterol  2.5 mg Nebulization Q4H   . amiodarone  400 mg Oral 2 times per day   . aspirin  300 mg Rectal Daily   . furosemide  80 mg Intravenous Q8H   . insulin aspart   Subcutaneous 6 times per day   . levofloxacin (LEVAQUIN) IV  500 mg Intravenous Q48H   . pantoprazole  40 mg Intravenous 2 times per day   . phenylephrine       . phenylephrine       . sodium chloride  0.9 % (NS) syringe  10 mL Intravenous Q8H SCH   . sodium chloride 0.9 % (NS) syringe  5-10 mL Intravenous Daily     Infusions:   . amiodarone in D5W 0.5 mg/min (02/06/16 0618)   . amiodarone in D5W 0.5 mg/min (02/06/16 0607)   . argatroban 0.7 mcg/kg/min (02/06/16 4098)   . clevidipine Stopped (02/02/16 0800)   . dexmedetomidine Stopped (02/05/16 1402)   . dextrose 10% (D10W)     . dextrose 5%  lactated ringers Stopped (01/29/16 1829)   . DOBUTamine (DOBUTREX) 250 mg in D5W 250 mL (1000 mcg/mL) infusion     . fentanyl Stopped (02/05/16 1402)   . midazolam Stopped (01/31/16 0900)   . milrinone Stopped (02/02/16 0736)   . nitroglycerin     . norepinephrine Stopped (02/05/16 1203)   . phenylephrine (NEO-SYNEPHRINE) 40 mg in NaCl 0.9 % 500 mL (80 mcg/mL) infusion **DOUBLE CONCENTRATION** Stopped (01/31/16 0515)   . propofol Stopped (02/05/16 0910)   . vasopressin (PITRESSIN) 50 units in NaCl 0.9 % 250 mL (0.2 units/mL) infusion **ADULT SHOCK** Stopped (01/28/16 1711)     Recent Labs:  Lab Results   Component Value Date    NA 134 (L) 02/06/2016    K 4.7 02/06/2016    K 3.69 01/23/2016    CL 101 02/06/2016    CL 104.1 01/23/2016    CO2 19.9 (L) 02/06/2016    CO2 20 (L) 01/23/2016    GLU 109 (H) 02/06/2016    GLU 85 01/23/2016    BUN 84 (H) 02/06/2016    CREATININES 2.76 (H) 02/06/2016    CALCIUM 9.7 02/06/2016    AST 26 02/04/2016    ALT 10 02/04/2016    ALKPHOS 121 (H) 02/04/2016    BILITOT 0.90 01/21/2016    ALBUMIN 2.3 (L) 02/06/2016    GLOB 3.0 02/04/2016    AGRATIO 0.9 (L) 02/04/2016    ANIONGAP 13.1 (H) 02/06/2016    ANIONGAP 21.0 (H) 01/23/2016    LABGLOM 18 (L) 02/06/2016    LABGLOM >60.0 01/23/2016    WHITEBLOODCE 21.5 (H) 02/06/2016    LABPLAT 172 02/06/2016    HGB 8.2 (L) 02/06/2016    PROTIME 22.3 (H) 02/03/2016    INR 2.0 (H) 02/03/2016    APTT 53 (H) 02/06/2016          Vital Signs:      ? Cardiac output   ? Cardiac Index   Temp: 37.4 ?C (99.3 ?F) BP: 102/60 Pulse: (!) 123 Resp: (!) 32 SpO2: 100 % on O2 Flow Rate (L/min): 30 L/min Bi-PAP   Intake/Output:     CT OUTPUT: discontinued yesterday    URINE OUTPUT: 222>2145    Exam:  Neuro: Intubated but follows commands  Cardiac: Atrial fibrillation irregular no murmurs  Lungs;clear  ABDOMEN:abdomen is soft without significant tenderness, masses, organomegaly or guarding  EXTREMITIES 2+ edema in the lower extremities toes appear much better digital  ischemia much improved    PLAN:   Neurologically wakes up when off sedation   Recovering from cardiogenic shock   On argatroban for HITT with thrombosis  Hemodialysis today  Continue to keep the patient nothing by mouth  Continue amiodarone drip     Continuing discussions as to possible trach and PEG tube in the near future.    Brandy [name]

## 2016-02-06 NOTE — Plan of Care (Signed)
SPEECH LANGUAGE PATHOLOGY EVALUATION & PLAN OF CARE - INPATIENT     Patient name: Brandy [name] Room Number: 2001  Date: 02/06/2016    Patient Class: Inpatient Inpatient Order(s) Received: SLP Swallow Eval and Treat    Comments: Pt kept NPO, per physician's instructions. If comfort po desired, then I recommend puree and pudding thick liquids. Pt not likely to be able to meet nutritional needs by mouth. All medications should remain by IV and nothing by mouth. May need to consider alternative nutrition, if swallowing function continues to be impaired.  Dysphagia Diagnosis: moderate oral dysphagia (R13.11)    PLAN OF CARE:  Additional diagnostic/treatment considerations: not at this time  Diet/Recommendations:   Solid Diet Level: NPO  Liquid Diet Level: NPO  Medication Administration: IV  Compensatory Swallowing Strategies: oral care per policy, monitor temperature and lung sounds  Diet change to/from NPO communication: HIPAA compliant text page to provider through secure hospital-endorsed system    Rehab Potential: good, to achieve stated therapy goals. Considerations: age;premorbid status;ability to learn/carry over information;- positive(s)  Treatment Plan: Skilled services of an SLP required to improve: swallowing function. Plan for next session: Reassess swallow function and possible initiation of po. Discharge Recommendations: to be determined    SLP Goals (by discharge)  Goals Objective Data   Goal 1: Pt will tolerate least restrictive p.o. diet consistencies with swallow WFL for safe and adequate nutrition/hydration New - pt only able to tolerate puree, pudding thick liquids, d/t severe lip seal deficit.    Goal 2: Patient will perform swallowing exercises with >80% accuracy with min cues/models.  New - poor lip seal     Patient Stated Goal(s): to swallow safely/better    Frequency: 5 times / week Duration: Inpatient stay or when goals met    PATIENT HISTORY:     Primary Diagnosis: Respiratory failure  (CMS/HCC)    Hospital Problem Present on Admission:  . Respiratory failure (CMS/HCC)  . NSTEMI (non-ST elevated myocardial infarction) (CMS/HCC)  . Aortic stenosis, severe  . Demand ischemia of myocardium (CMS/HCC)  . Iron deficiency anemia  . Obesity    Past Medical History:   Diagnosis Date   . History of blood transfusion      Additional Pertinent History: AVR 01/27/16    Precautions/Complicating Factors: supplemental oxygen;generalized weakness;decreased endurance;aspiration risk    Prior Level of Function: has been using speech to communicate;has been eating/drinking regular consistencies;has been living at home.     Living Situation: spouse/significant other;home/apartment ([place])    ASSESSMENT:     General Observations: Family/Caregiver Present: no         Activity Tolerance: fair activity tolerance;fatigued quickly Behavior/Cognition: alert;cooperative;anxious;painful Patient Positioning: edge of bed (dangling /c PT)    Dentition: adequate   Hearing: no obvious deficits Baseline Vocal Quality: Within Functional Limits    Respiratory Status: high flow/Vasotherm nasal cannula;open mouth breathing History of Intubation: yes Length of Intubation: 9 days Date extubated: 02/05/16   Spontaneous Cough: not observed      Pain Limitations: pain does not limit patient's ability to participate in therapy           Assessment Results:  Able to express wants/needs (I)ly.  No obvious verbal expression deficits.  Follows verbal commands, answers questions, interacts conversationally WFL.  No obvious auditory comprehension deficits.    Swallowing: moderate oral dysphagia (R13.11)  Reason for Referral: recent extubation  Current Solid Diet: NPO  Current Liquid Diet: NPO    Consistencies  Assessed: ice chips;pudding/puree    Item/Amount and Presentation:  Ice Chips - Amount: one bite  Ice - Presentation: spoon  Puree - Item/Amount: very small bites of applesauce - about 5  Puree - Presentation: spoon    Oral  Phase:  Ice - Oral: anterior bolus loss  Puree - Oral: no obvious deficits    Pharyngeal Phase:  Ice - Pharyngeal:  (lost from front of mouth)  Puree - Pharyngeal: no obvious deficits    Risk for Aspiration: uncertain/undeterminable    Recommendations: PO for comfort/pleasure;consider/continue alternative nutrition;dysphagia treatment Follow up  Dysphagia Treatments: swallowing exercises;therapeutic feeding trials;diet texture modification;assess diet tolerance    Dysphagia Comments: Pt has very poor lips seal. Not able to tolerate liquids. Recommended puree and pudding thick liquids, but kept NPO per physician. Pt not likely to meet nutritional needs by mouth. Too weak, takes too small of bites, doesn't have the endurance to complete an entire meal.    TODAY'S TREATMENT:  Evaluation completed.       Speech Language Pathologist: Alric Ran, CCC-SLP    Start/Stop Time: 6213 - 0865       Total Time: 53 minutes  SLP - Next Appointment: 02/07/16  Pisek SLP Eval Charges  $Swallow Eval [phone]): 53-67 mins

## 2016-02-06 NOTE — Progress Notes (Signed)
END OF SHIFT SUMMARY:      SENSORIUM:  Alert and awake. Pt very anxious  BS (and any changes with tx):  Clear and diminished throughout  TREATMENTS (or changes in treatment):  Albuterol q4. Unable to give tx due to high heart rate  SUCTIONING (amount, color, tenacity):  Oral suctioning.   XRAY:  Not interpreted at time of report  BiPAP CHANGES:  18*15/8*30%  SKIN INTEGRITY: Intact  GOALS/PLAN:  Transition to HHFNC during day as tolerated.  ADVERSE EVENTS:    COMMENTS:  Patient very anxious and needs reassuring. Pt 11.9 L fluid positive

## 2016-02-06 NOTE — Progress Notes (Signed)
Critical Care Progress Note:    Subjective:    The patient is a 60 year non smoking female with BMI 40 and?progressive dyspnea since June.   ??  8/11 Admitted to [facility] ?with respiratory distress, pulmonary edema, NSTEMIC, anemia requiring 2 units PRBCs. ECHO showed severe aortic stenosis, EF 30%. Heart cath demonstrated no significant coronary occlusion. ?  ??  8/13 Transferred to [facility].   ??  8/17 Dr. Debroah Baller performed aortic valve replacement and right femoral IABP placement. Intraoperative EF was noted to be 15%. The patient was brought to the CCU post operatively. She required multiple vasopressors and inotropes and also received methylene blue. Nebulized flolan for pHTN.   ??  8/18 IABP was removed.   ??  8/19 Swan clotted and was removed. Levaquin started. Flolan stopped.   ??  8/20 Temp HD catheter was placed on and CRRT initiated. Bronchoscopy performed due to fever. Foley removed, new foley placed.   ??  8/21 Still?intubated. Milrinone 0.375 mcg/kg/min,?levophed at 10 mcg/min. CRRT. Due to ischemic appearing digits right radial aline removed, left radial aline placed. No purposeful response during sedation holiday. Enteral feeds started.   ??  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 [place] confirmed RLE DVT. Patient remained?intubated on levophed, milrinone with?CRRT running. She received IV amiodarone for afib. IV hydrocortisone started given persistence of circulatory shock (requiring 9 mcg/min levophed plus milrinone).  ??  8/23 Patient awakened with sedation holiday. Spontaneous breathing attempted, appeared weak for extubation. ?Milrinone has been weaned off, levophed is down to 3 mcg/min. ECHO: EF30-35%, AV prosthesis OK, moderate mitral regurgitation, evidence of RV pressure or volume overload. ?.   ??  8/24 Patient awakened, followed commands. Left IJ dialysis catheter stopped?functioning. LUE?PICC placed, right IJ TLC removed, ew right IJ temporary HD cath placed, left IJ temp HD cath removed.  ??  ??  8/25 Still appeared too weak for extubation.   ?  8/26 The patient passed some bloody stools.Extubation.  Post extubation she required bipap alternating with heat high flow NC O2. Reported pain in her feet.     The patient had a good response to lasix overnight. As urine output is improving, no plans for dialysis today. Respiratory status appears improved. She is still reporting pain in her feet.      Medications: Reviewed    Objective:    Physical Exam:    Vitals:    02/06/16 1238   BP:    Pulse: (!) 124   Resp: 20   Temp:    SpO2:          Intake/Output Summary (Last 24 hours) at 02/06/16 1257  Last data filed at 02/06/16 1210   Gross per 24 hour   Intake           1393.6 ml   Output             3480 ml   Net          -2086.4 ml       Weight: 115.7 kg (255 lb 1.2 oz)    Gen:  60 y.o.yr old  female supine, ill appearing but not overtly distressed  HEENT: Atraumatic  Neck: Supple  Chest: sternotomy  Lungs: clear to ausculation  Cardiac: s1 s2  Abdm: Soft, non-tender  Ext: ischemic digits right hand/toes  Neuro: Awake, oriented, generalized weakness    Continuous Infusions:  . amiodarone in D5W 0.5 mg/min (02/06/16 0618)   . amiodarone in D5W 0.5 mg/min (02/06/16 0607)   .  argatroban 0.7 mcg/kg/min (02/06/16 8119)   . clevidipine Stopped (02/02/16 0800)   . dexmedetomidine Stopped (02/05/16 1402)   . dextrose 10% (D10W)     . dextrose 5% lactated ringers Stopped (01/29/16 1829)   . DOBUTamine (DOBUTREX) 250 mg in D5W 250 mL (1000 mcg/mL) infusion     . fentanyl Stopped (02/05/16 1402)   . midazolam Stopped (01/31/16 0900)   . milrinone Stopped (02/02/16 0736)   . nitroglycerin     . norepinephrine Stopped (02/05/16 1203)   . phenylephrine (NEO-SYNEPHRINE) 40 mg in NaCl 0.9 % 500 mL (80 mcg/mL) infusion **DOUBLE CONCENTRATION** Stopped (01/31/16 0515)   . propofol Stopped (02/05/16 0910)   . vasopressin (PITRESSIN) 50 units in NaCl 0.9 % 250 mL (0.2 units/mL) infusion **ADULT SHOCK** Stopped (01/28/16 1711)           Meds:  . albuterol  2.5 mg Nebulization Q4H   . amiodarone  400 mg Oral 2 times per day   . aspirin  300 mg Rectal Daily   . furosemide  80 mg Intravenous Q12H   . insulin aspart   Subcutaneous 6 times per day   . levofloxacin (LEVAQUIN) IV  500 mg Intravenous Q48H   . pantoprazole  40 mg Intravenous 2 times per day   . phenylephrine       . phenylephrine       . sodium chloride 0.9 % (NS) syringe  10 mL Intravenous Q8H SCH   . sodium chloride 0.9 % (NS) syringe  5-10 mL Intravenous Daily       Ventilator:   FiO2 (%):  [30-40] 30  O2 Device: Heated high flow humidified nasal cannula  O2 Flow Rate (L/min):  [30 L/min-40 L/min] 40 L/min    LABS:  Recent Labs      02/05/16   2232  02/06/16   0343  02/06/16   1221   HGB  8.5*  8.2*  7.8*     Recent Labs      02/04/16   1527  02/05/16   0336  02/05/16   1816  02/06/16   0343   NA  133*  134*   --   134*   K  4.7  4.3  4.7  4.7   CL  100  102   --   101   CO2  20.9*  20.9*   --   19.9*   BUN  46*  62*   --   84*   GLU  99  126*   --   109*     Recent Labs      02/04/16   0407   AST  26   ALT  10   ALKPHOS  121*     Recent Labs      02/03/16   1757   INR  2.0*     Recent Labs      02/04/16   0419   02/05/16   0331  02/05/16   1153  02/05/16   1634  02/05/16   1745   PO2  140.4*   < >  118.0*  119.3*  123.9*  67.1*   PEEP  5.0   --   5.0  5.0   --    --     < > = values in this interval not displayed.       GLUCOSE:  Recent Labs      02/05/16   1556  02/05/16   2036  02/06/16   0025  02/06/16   0811  02/06/16   1222   POCGLU  107*  105*  113*  103*  101*       Imaging:   X-ray Chest Pa Or Ap    Result Date: 02/06/2016  CHEST 1 VIEW 02/06/2016 5:21 AM PROVIDED CLINICAL INDICATIONS:  Assess tube and line placement - post-op cardiac surgery    ADDITIONAL CLINICAL HISTORY:  None. COMPARISON:  02/05/2016 TECHNIQUE:  Single AP portable view FINDINGS:  The heart is at least moderately enlarged and mid increased in size. The pulmonary vascular appears be within normal limits.  There is an opacity in the RIGHT midlung laterally that has increased although it is small. Mild bibasilar opacities have increased. There is no definite pleural fluid and no pneumothorax.     IMPRESSION:  1. Small RIGHT middle lung opacity laterally suggesting pneumonia or possibly pulmonary infarct as it is relatively wedge-shaped. 2. 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 3. Mild bibasilar pulmonary opacities increased and likely subsegmental atelectasis, less likely pneumonia or edema.       Assessment    Active Hospital Problems    Diagnosis SNOMED CT(R) Date Noted   . Heparin induced thrombocytopenia (CMS/HCC) HEPARIN-INDUCED THROMBOCYTOPENIA 02/02/2016   . S/P AVR (aortic valve replacement) HISTORY OF AORTIC VALVE REPLACEMENT 01/27/2016   . Respiratory failure (CMS/HCC) RESPIRATORY FAILURE 01/23/2016   . Aortic stenosis, severe AORTIC VALVE STENOSIS 01/22/2016   . NSTEMI (non-ST elevated myocardial infarction) (CMS/HCC) ACUTE NON-ST SEGMENT ELEVATION MYOCARDIAL INFARCTION 01/21/2016   . Demand ischemia of myocardium (CMS/HCC) ACUTE ISCHEMIC HEART DISEASE 01/21/2016   . Iron deficiency anemia IRON DEFICIENCY ANEMIA 01/21/2016   . 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  ?  Plan:    Neurologic: PT/OT. Analgesia as required.     Pulmonary: Pulmonary status seems to be improving with diuresis. Continue heated high flow NC O2. Bipap prn.     Cardiac: Plan per CT surgery.     ID: Levaquin day #9. WBC 24. Tm 37.4. I would consider antibiotic holiday soon. If she develops fever and/or  progressive leukocytosis, blood cultures should be obtained and consider empirically adjusting antibiotics.     Renal: Diuresis as BP and renal function permit.  Continue foley for precise monitoring of urine output and to protect skin integrity in this morbidly obese, moribund critically ill patient with renal failure, circulatory overload and continued requirement for diuresis. HD on hold given apparent renal recovery.     GI: Swallow eval. Given her iron deficiency anemia on admission, blood per rectum during hospitalization, she will need colonoscopy at some point when more clinically stable. She will need GI consult and urgent endoscopy if her H/H drops more precipitously.     Endocrine: Insulin sliding scale as needed to maintain euglycemia.     Heme: H/H 7.8    Platelets 187.  Argatroban for HIT. Monitor counts. Famotidine switched to BID IV pantoprazole yesterday given GI bleeding.     Code Status: full    This patient remains at very high risk of organ failure, deterioration, and/or death and continues  to require critical care management.  I visited with the patient and her husband, shared my assessment, answered their questions. I have reviewed the case with the critical care nursing and respiratory staff.     Critical Care Time, excluding billable procedures:  42  mins    Casimiro Needle Pookela Sellin,MD

## 2016-02-06 NOTE — Progress Notes (Signed)
Right dorsal foot with small ulcer, closed.  Painted using iodine for microbial control.  Purple, painful and cool right fingers and toes.  No open areas.  Watch for breakdown and keep warm.  Coordinated care with RN and standard precautions observed.      No billed items

## 2016-02-07 ENCOUNTER — Inpatient Hospital Stay: Admit: 2016-02-07 | Payer: MEDICAID

## 2016-02-07 LAB — ABG
A-a Gradient: 42.8 mmHg
Base Excess: -3.2 mmol/L — ABNORMAL LOW (ref ?–2.0)
Carboxyhemoglobin: 0.6 % (ref 0.0–1.5)
FIO2: 30
HCO3 Arterial: 20.9 mmol/L — ABNORMAL LOW (ref 22.0–26.0)
Hemoglobin: 8.1 g/dL — ABNORMAL LOW (ref 12.0–16.0)
Inspiratory Time: 0.9
Methemoglobin: 0.2 % (ref 0.0–1.0)
Minute Ventilation: 16
O2Hb: 97.3 % — ABNORMAL HIGH (ref 94.0–97.0)
PSV: 7
Patient Temperature: 37 C
Peak Inspiratory Pressure: 16 cmH2O
Puncture Attempts: 1
Respiratory Rate: 20 b/min
Tidal Volume: 570 mL
Time Analyzed: 20170828033217
a/A Ratio: 0.74
pCO2 Arterial: 33.2 mmHg — ABNORMAL LOW (ref 35.0–45.0)
pH Arterial: 7.416 (ref 7.350–7.450)
pO2 Arterial: 122.2 mmHg — ABNORMAL HIGH (ref 80.0–100.0)
sO2 Arterial: 98.1 % (ref 92.0–98.5)

## 2016-02-07 LAB — CBC WITH AUTO DIFFERENTIAL
Bands %: 1 % (ref 0–10)
Bands, Absolute: 0.2 10*3/ÂµL (ref 0.0–1.2)
Basophils %: 1 % (ref 0–2)
Basophils, Absolute: 0.2 10*3/ÂµL (ref 0.0–0.2)
Eosinophils %: 0 % (ref 0–7)
Eosinophils, Absolute: 0 10*3/ÂµL (ref 0.0–0.7)
HCT: 23.1 % — ABNORMAL LOW (ref 37.0–48.0)
Hemoglobin: 7.1 g/dL — ABNORMAL LOW (ref 12.0–16.0)
Lymphocytes %: 5 % — ABNORMAL LOW (ref 25–45)
Lymphocytes, Absolute: 1 10*3/ÂµL — ABNORMAL LOW (ref 1.1–4.3)
MCH: 22.6 pg — ABNORMAL LOW (ref 27.0–34.0)
MCHC: 30.9 g/dL — ABNORMAL LOW (ref 32.0–36.0)
MCV: 73.3 fL — ABNORMAL LOW (ref 81.0–99.0)
MPV: 8.1 fL (ref 7.4–10.4)
Monocytes %: 2 % (ref 0–12)
Monocytes, Absolute: 0.4 10*3/ÂµL (ref 0.0–1.2)
Neutrophils %: 91 % — ABNORMAL HIGH (ref 35–70)
Neutrophils, Absolute: 17.4 10*3/ÂµL — ABNORMAL HIGH (ref 1.6–7.3)
Platelet Count: 247 10*3/ÂµL (ref 150–400)
Platelet Estimate: NORMAL
RBC: 3.15 10*6/ÂµL — ABNORMAL LOW (ref 4.20–5.40)
RDW: 26.3 % — ABNORMAL HIGH (ref 11.5–14.5)
WBC: 19.1 10*3/ÂµL — ABNORMAL HIGH (ref 4.8–10.8)

## 2016-02-07 LAB — RENAL FUNCTION PANEL
Albumin: 2.2 g/dL — ABNORMAL LOW (ref 3.5–5.0)
Anion Gap: 13.7 mmol/L — ABNORMAL HIGH (ref 3.0–11.0)
BUN: 89 mg/dL — ABNORMAL HIGH (ref 6–23)
CO2 - Carbon Dioxide: 21.3 mmol/L (ref 21.0–31.0)
Calcium: 9.6 mg/dL (ref 8.6–10.3)
Chloride: 100 mmol/L (ref 98–111)
Creatinine: 3.16 mg/dL — ABNORMAL HIGH (ref 0.55–1.10)
GFR Estimate: 15 mL/min/{1.73_m2} — ABNORMAL LOW (ref >=60)
Glucose: 102 mg/dL — ABNORMAL HIGH (ref 80–99)
Phosphorus: 5.2 mg/dL — ABNORMAL HIGH (ref 2.5–4.5)
Potassium: 4.1 mmol/L (ref 3.5–5.1)
Sodium: 135 mmol/L (ref 135–143)

## 2016-02-07 LAB — APTT
APTT: 46 Seconds — ABNORMAL HIGH (ref 23–36)
APTT: 55 Seconds — ABNORMAL HIGH (ref 23–36)
APTT: 55 Seconds — ABNORMAL HIGH (ref 23–36)

## 2016-02-07 LAB — POCT GLUCOSE
POC Glucose: 101 mg/dL — ABNORMAL HIGH (ref 80–99)
POC Glucose: 88 mg/dL (ref 80–99)
POC Glucose: 96 mg/dL (ref 80–99)

## 2016-02-07 LAB — BASIC METABOLIC PANEL
Anion Gap: 13 mmol/L — ABNORMAL HIGH (ref 3.0–11.0)
BUN: 86 mg/dL — ABNORMAL HIGH (ref 6–23)
CO2 - Carbon Dioxide: 21 mmol/L (ref 21.0–31.0)
Calcium: 9.7 mg/dL (ref 8.6–10.3)
Chloride: 101 mmol/L (ref 98–111)
Creatinine: 2.99 mg/dL — ABNORMAL HIGH (ref 0.55–1.10)
GFR Estimate: 16 mL/min/{1.73_m2} — ABNORMAL LOW (ref >=60)
Glucose: 103 mg/dL — ABNORMAL HIGH (ref 80–99)
Potassium: 4.3 mmol/L (ref 3.5–5.1)
Sodium: 135 mmol/L (ref 135–143)

## 2016-02-07 MED FILL — FUROSEMIDE 10 MG/ML INJECTION SOLUTION: 10 mg/mL | INTRAMUSCULAR | Qty: 8

## 2016-02-07 MED FILL — PROTONIX 40 MG INTRAVENOUS SOLUTION: 40 mg | INTRAVENOUS | Qty: 40

## 2016-02-07 MED FILL — MORPHINE 2 MG/ML INTRAVENOUS CARTRIDGE: 2 mg/mL | INTRAVENOUS | Qty: 1

## 2016-02-07 MED FILL — ARGATROBAN 50 MG/50 ML (1 MG/ML) IN SODIUM CHLORIDE (ISO-OSMOTIC) IV: 50 mg/50 mL (1 mg/mL) | INTRAVENOUS | Qty: 50

## 2016-02-07 MED FILL — NEXTERONE 360 MG/200 ML (1.8 MG/ML) INTRAVENOUS SOLUTION: 360 mg/200 mL (1.8 mg/mL) | INTRAVENOUS | Qty: 360

## 2016-02-07 NOTE — Progress Notes (Addendum)
Critical Care Progress Note:    Subjective: The patient is a 60 year non smoking female with BMI 40 and?progressive dyspnea since June.   ??  8/11 Admitted to [facility] ?with respiratory distress, pulmonary edema, NSTEMIC, anemia requiring 2 units PRBCs. ECHO showed severe aortic stenosis, EF 30%. Heart cath demonstrated no significant coronary occlusion. ?  ??  8/13 Transferred to [facility].   ??  8/17 Dr. Debroah Baller performed aortic valve replacement and right femoral IABP placement. Intraoperative EF was noted to be 15%. The patient was brought to the CCU post operatively. She required multiple vasopressors and inotropes and also received methylene blue. Nebulized flolan for pHTN.   ??  8/18 IABP was removed.   ??  8/19 Swan clotted and was removed. Levaquin started. Flolan stopped.   ??  8/20 Temp HD catheter was placed on and CRRT initiated. Bronchoscopy performed due to fever. Foley removed, new foley placed.   ??  8/21 Still?intubated. Milrinone 0.375 mcg/kg/min,?levophed at 10 mcg/min. CRRT. Due to ischemic appearing digits right radial aline removed, left radial aline placed. No purposeful response during sedation holiday. Enteral feeds started.   ??  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 [place] confirmed RLE DVT. Patient remained?intubated on levophed, milrinone with?CRRT running. She received IV amiodarone for afib. IV hydrocortisone started given persistence of circulatory shock (requiring 9 mcg/min levophed plus milrinone).  ??  8/23 Patient awakened with sedation holiday. Spontaneous breathing attempted, appeared weak for extubation. ?Milrinone has been weaned off, levophed is down to 3 mcg/min. ECHO: EF30-35%, AV prosthesis OK, moderate mitral regurgitation, evidence of RV pressure or volume overload. ?.   ??  8/24 Patient awakened, followed commands. Left IJ dialysis catheter stopped?functioning. LUE?PICC placed, right IJ TLC removed, ew right IJ temporary HD cath placed, left IJ temp HD cath removed.  ??  ??  8/25 Still appeared too weak for extubation.   ??  8/26 The patient passed some bloody stools.Extubation.  Post extubation she required bipap alternating with heat high flow NC O2. Reported pain in her feet.   ?  8/27: had good response to lasix. As urine output is improving, no plans for dialysis. Respiratory status appears improved. Failed SLP.    Continues on amiodarone infusion with afib and vent rates of about 120bpm. Worked with PT this morning. 'Breathing fine'. Strong productive cough.    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  ?  Plan:    Neurologic: PT/OT. Analgesia as required.   ?  Pulmonary: Pulmonary status seems  to be improving with diuresis. Will try NC today. Bipap prn.   ?  Cardiac: Plan per CT surgery.     ID: Levaquin day #10. WBC 24. Tm 19. D/C Levaquin after 4 more doses. If she develops fever and/or progressive leukocytosis, blood cultures should be obtained.    Renal: Continue foley for precise monitoring of urine output and to protect skin integrity in this morbidly obese, moribund critically ill patient with  renal failure, circulatory overload and continued requirement for diuresis. HD on hold as renal recovery expected.   ?  GI: NPO per SLP. Repeat today. Given her iron deficiency anemia on admission, blood per rectum during hospitalization, she will need colonoscopy at some point when more clinically stable. She will need GI consult and urgent endoscopy if her H/H drops more precipitously.   ?  Endocrine: Insulin sliding scale as needed to maintain euglycemia.   ?  Heme: H/H 7.1    Platelets 247.  Argatroban for HIT. Monitor counts. Famotidine switched to BID IV pantoprazole on 8/26 given GI bleeding.   ?  Code Status: full    Critical care time spent: 30 mins    Melissa Pulido Camillo Flaming

## 2016-02-07 NOTE — Progress Notes (Signed)
Daily Progress Note     Name: Brandy [name]  DOB: 1955-08-29 60 y.o.  MRN: [id number]  CSN: [id number]    Assessment & Plan:     Active Hospital Problems    Diagnosis SNOMED CT(R) Date Noted   . Heparin induced thrombocytopenia (CMS/HCC) HEPARIN-INDUCED THROMBOCYTOPENIA 02/02/2016   . S/P AVR (aortic valve replacement) HISTORY OF AORTIC VALVE REPLACEMENT 01/27/2016   . Respiratory failure (CMS/HCC) RESPIRATORY FAILURE 01/23/2016   . Aortic stenosis, severe AORTIC VALVE STENOSIS 01/22/2016   . NSTEMI (non-ST elevated myocardial infarction) (CMS/HCC) ACUTE NON-ST SEGMENT ELEVATION MYOCARDIAL INFARCTION 01/21/2016   . Demand ischemia of myocardium (CMS/HCC) ACUTE ISCHEMIC HEART DISEASE 01/21/2016   . Iron deficiency anemia IRON DEFICIENCY ANEMIA 01/21/2016   . Obesity OBESITY 01/21/2016     --AKI: ATN secondary to cardiogenic shock and possible sepsis from nosocomial pneumonia.  CRRT discontinued 8/25. due to high return pressures.   Volume status improved but remains edematous.    Hemodynamics significantly improved.  Urine output continues excellent. Has been stimulated by Lasix  Creatinine creatinine still edging upward 2.9-3.1 today    --Pneumonia: On Levaquin, now afebrile.    Persistent leukocytosis slowly improving  --S/P AVR:  Complicated by cardiogenic shock.  Hemodynamics improving.    --A-fib with RVR: Started on amiodarone.   --Heparin Induced Thrombcytopenia. 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 Ideally would like to wait until she is off antibiotics.  --Following closely    Subjective:     Brandy [name] is a 60 y.o. female patient. Patient was admitted with critical AS s/p AVR  complicated by cardiogenic shock/AKI.  CRRT was required and then discontinued 8/25 due to high return pressures. Patient has now not been dialyzed for the last several days with signs of improving urinary output.   Remains on amiodarone for A-fib.  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   Remains extubated and off pressors.    Patient much more alert and interactive. She is feeling better.    Ongoing excellent urinary output just over 3 L yesterday.        Scheduled Medications:   . albuterol  2.5 mg Nebulization Q4H   . aspirin  300 mg Rectal Daily   . insulin aspart   Subcutaneous 6 times per day   . levofloxacin (LEVAQUIN) IV  500 mg Intravenous Q48H   . pantoprazole  40 mg Intravenous 2 times per day   . phenylephrine       . phenylephrine       . sodium chloride 0.9 % (NS) syringe  10 mL Intravenous Q8H SCH   . sodium chloride 0.9 % (NS) syringe  5-10 mL Intravenous Daily     Infusions:   . amiodarone in D5W 0.5 mg/min (02/07/16 0254)   . amiodarone in D5W Stopped (02/06/16 1547)   . amiodarone in D5W Stopped (02/06/16 1938)   . argatroban 0.8 mcg/kg/min (02/07/16 0433)   . clevidipine Stopped (02/02/16 0800)   . dexmedetomidine Stopped (02/05/16 1402)   . dextrose 10% (D10W)     . dextrose 5% lactated ringers Stopped (01/29/16 1829)   . DOBUTamine (DOBUTREX) 250 mg in D5W 250 mL (1000  mcg/mL) infusion     . fentanyl Stopped (02/05/16 1402)   . midazolam Stopped (01/31/16 0900)   . milrinone Stopped (02/02/16 0736)   . nitroglycerin     . norepinephrine Stopped (02/05/16 1203)   . phenylephrine (NEO-SYNEPHRINE) 40 mg in NaCl 0.9 % 500 mL (80 mcg/mL) infusion **DOUBLE CONCENTRATION** Stopped (01/31/16 0515)   . propofol Stopped (02/05/16 0910)   . vasopressin (PITRESSIN) 50 units in NaCl 0.9 % 250 mL (0.2 units/mL) infusion **ADULT SHOCK** Stopped (01/28/16 1711)     PRN Medications: acetaminophen (TYLENOL) suppository, acetaminophen, amiodarone in D5W, amiodarone in D5W, anticoagulant citrate dextrose solution  A, calcium gluconate IVPB, clevidipine, dextrose 5% lactated ringers, dextrose, DOBUTamine (DOBUTREX) 250 mg in D5W 250 mL (1000 mcg/mL) infusion, EPINEPHrine, fentanyl, hydrALAZINE, HYDROcodone-acetaminophen, magnesium sulfate IVPB, magnesium sulfate IVPB, meperidine (PF), midazolam, milrinone, morphine, nitroglycerin, norepinephrine, oxyCODONE, phenylephrine (NEO-SYNEPHRINE) 40 mg in NaCl 0.9 % 500 mL (80 mcg/mL) infusion **DOUBLE CONCENTRATION**, propofol, protamine, sodium chloride, sodium chloride 0.9 % (NS) syringe, sodium chloride 0.9 % (NS) syringe, sterile water (bottle)    Objective:     Vital Signs:    Vital Sign Ranges for Last 24 Hours:  BP  Min: 72/44  Max: 114/74  Temp  Min: 36.3 ?C (97.3 ?F)  Max: 36.5 ?C (97.7 ?F)  Pulse  Min: 107  Max: 132  Resp  Min: 12  Max: 37  SpO2  Min: 95 %  Max: 100 %    Most Recent Vitals  BP: 87/52  Pulse: 123  Resp: 28  Temp: 36.5 ?C (97.7 ?F)  SpO2: 100 % (08/28 0400-08/28 0729)    Intake/Ouput:    Intake/Output Summary (Last 24 hours) at 02/07/16 0929  Last data filed at 02/07/16 0600   Gross per 24 hour   Intake            655.8 ml   Output             2795 ml   Net          -2139.2 ml        Exam:  Alert and oriented and interactive  RIJ CVC  Coarse UA BS  Atrial fibrillation rate 115  Abd: Obese, nontender  Ext: 2+ edema      Recent Labs:  Results for orders placed or performed during the hospital encounter of 01/23/16 (from the past 24 hour(s))   CBC without Differential -Q6H    Collection Time: 02/06/16 12:21 PM   Result Value Ref Range    WBC 24.5 (H) 4.8 - 10.8 10*3/?L    RBC 3.43 (L) 4.20 - 5.40 10*6/?L    Hemoglobin 7.8 (L) 12.0 - 16.0 g/dL    HCT 16.1 (L) 09.6 - 48.0 %    MCV 73.0 (L) 81.0 - 99.0 fL    MCH 22.7 (L) 27.0 - 34.0 pg    MCHC 31.1 (L) 32.0 - 36.0 g/dL    RDW 04.5 (H) 40.9 - 14.5 %    Platelet Count 187 150 - 400 10*3/?L    MPV 8.3 7.4 - 10.4 fL   Magnesium -Timed    Collection Time: 02/06/16 12:21 PM   Result Value Ref Range    Magnesium 2.1 1.6 -  2.4 mg/dL   POCT Glucose -Next Routine    Collection Time: 02/06/16 12:22 PM   Result Value Ref Range    POC Glucose 101 (H) 80 - 99 mg/dL   CBC without Differential -Q6H    Collection Time:  02/06/16  4:41 PM   Result Value Ref Range    WBC 22.8 (H) 4.8 - 10.8 10*3/?L    RBC 3.44 (L) 4.20 - 5.40 10*6/?L    Hemoglobin 7.8 (L) 12.0 - 16.0 g/dL    HCT 16.1 (L) 09.6 - 48.0 %    MCV 72.7 (L) 81.0 - 99.0 fL    MCH 22.6 (L) 27.0 - 34.0 pg    MCHC 31.1 (L) 32.0 - 36.0 g/dL    RDW 04.5 (H) 40.9 - 14.5 %    Platelet Count 207 150 - 400 10*3/?L    MPV 8.2 7.4 - 10.4 fL   POCT Glucose -Next Routine    Collection Time: 02/06/16  4:50 PM   Result Value Ref Range    POC Glucose 106 (H) 80 - 99 mg/dL   Basic Metabolic Panel -ASAP    Collection Time: 02/06/16  7:50 PM   Result Value Ref Range    Sodium 135 135 - 143 mmol/L    Potassium 4.3 3.5 - 5.1 mmol/L    Chloride 101 98 - 111 mmol/L    CO2 - Carbon Dioxide 21.0 21.0 - 31.0 mmol/L    Glucose 103 (H) 80 - 99 mg/dL    BUN 86 (H) 6 - 23 mg/dL    Creatinine 8.11 (H) 0.55 - 1.10 mg/dL    Calcium 9.7 8.6 - 91.4 mg/dL    Anion Gap 78.2 (H) 3.0 - 11.0 mmol/L    GFR Estimate 16 (L) >=60 mL/min/1.30m*2    GFR Additional Info     POCT Glucose -Next Routine    Collection Time: 02/07/16  1:28 AM   Result Value Ref Range    POC Glucose 101 (H) 80 - 99 mg/dL   APTT -Daily    Collection Time: 02/07/16  3:05 AM   Result Value Ref Range    APTT 46 (H) 23 - 36 Seconds   CBC with Auto Differential -Daily    Collection Time: 02/07/16  3:05 AM   Result Value Ref Range    WBC 19.1 (H) 4.8 - 10.8 10*3/?L    RBC 3.15 (L) 4.20 - 5.40 10*6/?L    Hemoglobin 7.1 (L) 12.0 - 16.0 g/dL    HCT 95.6 (L) 21.3 - 48.0 %    MCV 73.3 (L) 81.0 - 99.0 fL    MCH 22.6 (L) 27.0 - 34.0 pg    MCHC 30.9 (L) 32.0 - 36.0 g/dL    RDW 08.6 (H) 57.8 - 14.5 %    Platelet Count 247 150 - 400 10*3/?L    MPV 8.1 7.4 - 10.4 fL    Neutrophils % 91 (H) 35 - 70 %    Bands % 1 0 - 10 %    Lymphocytes % 5 (L) 25 - 45 %    Monocytes % 2 0 -  12 %    Eosinophils % 0 0 - 7 %    Basophils % 1 0 - 2 %    Neutrophils, Absolute 17.4 (H) 1.6 - 7.3 10*3/?L    Bands, Absolute 0.2 0.0 - 1.2 10*3/?L    Lymphocytes, Absolute 1.0 (L) 1.1 - 4.3 10*3/?L    Monocytes, Absolute 0.4 0.0 - 1.2 10*3/?L    Eosinophils, Absolute 0.0 0.0 - 0.7 10*3/?L    Basophils, Absolute 0.2 0.0 - 0.2 10*3/?L    Differential Type Manual Differential     Platelet Estimate Normal Normal    Anisocytosis 2+ (A) (none)  Hypochromasia 2+ (A) (none)    Microcytes 2+ (A) (none)    Schistocytes 1+ (A) (none)   Renal Function Panel -Daily    Collection Time: 02/07/16  3:05 AM   Result Value Ref Range    Albumin 2.2 (L) 3.5 - 5.0 g/dL    Calcium 9.6 8.6 - 16.1 mg/dL    Phosphorus 5.2 (H) 2.5 - 4.5 mg/dL    BUN 89 (H) 6 - 23 mg/dL    Creatinine 0.96 (H) 0.55 - 1.10 mg/dL    Sodium 045 409 - 811 mmol/L    Glucose 102 (H) 80 - 99 mg/dL    Potassium 4.1 3.5 - 5.1 mmol/L    Chloride 100 98 - 111 mmol/L    CO2 - Carbon Dioxide 21.3 21.0 - 31.0 mmol/L    Anion Gap 13.7 (H) 3.0 - 11.0 mmol/L    GFR Estimate 15 (L) >=60 mL/min/1.69m*2    GFR Additional Info     Blood Gas-Arterial -PRN    Collection Time: 02/07/16  3:28 AM   Result Value Ref Range    ABG PH (ASA) 7.416 7.350 - 7.450    PCO2 Arterial Blood Gas 33.2 (L) 35.0 - 45.0 mmHg    pO2 Arterial Blood Gas 122.2 (H) 80.0 - 100.0 mmHg    HCO3 Arterial Blood Gas 20.9 (L) 22.0 - 26.0 mmol/L    Base Excess -3.2 (L) -2.0 - 2.0 mmol/L    tHb 8.1 (L) 12.0 - 16.0 g/dL    B1YN 82.9 (H) 56.2 - 97.0 %    Carboxyhemoglobin 0.6 0.0 - 1.5 %    Methemoglobin 0.2 0.0 - 1.0 %    O2 Saturation 98.1 92.0 - 98.5 %    a/A Ratio 0.74     ABG AA GRADIENT (ASA) 42.8 mmHg    Patient Temperature 37.0 C    Allen's Test NA     Sample Type Blood     Specimen Site AL     O2 Delivery Device BiPAP     FIO2 30     Ventilator Mode BiPAP     Minute Ventilation 16     Tidal Volume 570.0 mL    Respiratory Rate 20.0 b/min    PSV 7     Peak Inspiratory Pressure 16.0 cmH2O    Inspiratory Time  .90     Drawn By Tm     Puncture Attempts 1     Time Analyzed [id number]     Analyzed By Jola Schmidt     Blood Gas Comment IPAP 15    APTT -Timed    Collection Time: 02/07/16  6:02 AM   Result Value Ref Range    APTT 55 (H) 23 - 36 Seconds   POCT Glucose -Next Routine    Collection Time: 02/07/16  8:37 AM   Result Value Ref Range    POC Glucose 88 80 - 99 mg/dL     Pending Labs     Order Current Status    Blood Gas-Arterial -See Comments for Frequency In process    Extra Tubes (ASA) -Next Routine In process    Red Top -Once In process    Blood Culture -x 2 Preliminary result    Blood Culture -x 2 Preliminary result    Respiratory culture -Next Routine Preliminary result        Recent Imaging:  No results found.     LOS: 15 days     Darrel Hoover  02/07/2016  9:29 AM

## 2016-02-07 NOTE — Other (Signed)
FIBEROPTIC ENDOSCOPIC EVALUATION OF SWALLOW (FEES) - INPATIENT  ?  Patient: Brandy [name]                Room Number: 2001  Date of Service: 02/07/16     Impressions and Treatment Recommendations:  Daylen demonstrated moderate oropharyngeal dysphagia (R13.12). Penetration/Aspiration Scale: 2 - Penetration not to level of folds & forced out of larynx  Dysphagia treatment is recommended to improve swallowing function & reduce aspiration risk. Pt demonstrated one episode of laryngeal penetration of thin liquids during first PO trial. She also presented with vallecular residue of advanced solid (graham cracker) that she was able to clear with a liquid wash. Pt's breathing became labored during mastication. Recommend that she be conservatively advanced to dysphagia puree and thin liquids. She must be fully upright and fully alert for all PO intake. She requires 1:1 assist for PO intake a this time.   ?    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)  Diet Recommendations:  Solid Diet Level: Dysphagia Pureed  Liquid Diet Level: thin/regular  Medication Administration: crushed, give with puree  Compensatory Swallowing Strategies: upright as possible for all oral intake, full supervision with meals, one to one assist with meals, small bites/sips, single sips only, monitor for fatigue, avoid thick/sticky foods, avoid dry, crumbly foods, moisten food with sauces, gravy, etc., oral care per policy, monitor temperature and lung sounds  Diet change to/from NPO communication: HIPAA compliant text page to provider through secure hospital-endorsed system  The study was reviewed with: the patient  ?  Further Information:   Precautions/Complicating Factors: fall risk;supplemental oxygen;generalized weakness;decreased endurance;aspiration risk  Living Situation: spouse/significant other  Prior Level of Function: no history of swallow deficits.     Activity Tolerance: good activity tolerance.  Respiratory Status: high flow/Vasotherm nasal cannula. Behavior/Cognition: alert;cooperative. Dentition: adequate. Family/Caregiver Present: no    ?  Baseline Vocal Quality: dysphonic;weak. Spontaneous Cough: weak.         Chief Complaint/Medical History:  Reported symptoms include:  (Recent extubation)  Additional Pertinent History: AVR 01/27/16       Past Medical History:   Diagnosis Date   . History of blood transfusion ?   ?  History was obtained by: review of patient's EMR;interview with the patient  ?  Current Solid Diet: NPO  Current Liquid Diet: NPO  ?  Test Procedures:  Views and positions: upright in bed    Consistencies administered: thin liquid;pudding/puree;cookie/cracker  ?  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 However, would defer to ENT for diagnosis.   ?  Secretion Management: Impaired: Pooled secretions: pyriform sinuses;lateral channels  ?  Respiratory Function: normal at-rest breathing  Phonation: incomplete;asymmetrical;irregular  ?  VF/arytenoid mobility: reduced bilaterally  Laryngeal closure: complete  Laryngeal elevation/arytenoid lift: reduced  Pharyngeal wall medialization: reduced bilaterally  ?  Epiglottic inversion: reduced  Residue Score: mild  Clearance of residue: mostly cleared;with dry swallow;with liquid wash  ?  Oral Phase: premature spillage;prolonged oral transit  ?  Pharyngeal Phase: secondary site of swallow trigger at level of valleculae;reduced epiglottic inversion;reduced pharyngeal constriction;vallecular residue;pyriform residue;posterior pharyngeal wall residue;other pharyngeal residue;multiple spontaneous swallows to clear bolus  ?  Pharyngoesophageal Phase: no obvious deficits  ?  Penetration/Aspiration Scale: 2 -  Penetration not to level of folds & forced out of larynx  ?  Compensatory Techniques Attempted: reswallows;liquid wash;single sips only  ?  Additional diagnostic/treatment considerations: not at this time  Rehab Potential: good, to achieve stated therapy goals. Considerations: age;premorbid status;ability to learn/carry over information;- positive(s)  Treatment Plan: Skilled services of an SLP required to improve: swallowing function. Plan for next session: Initiate swallow exercises and reassess diet tolerance Discharge Recommendations: to be determined  ?  SLP Goals (by discharge)  Goals Objective Data   Goal 1: Pt will tolerate least restrictive p.o. diet consistencies with swallow WFL for safe and adequate nutrition/hydration Pt demonstrated one episode of laryngeal penetration of thin liquids during first PO trial. She also presented with vallecular residue of advanced solid (graham cracker) that she was able to clear with a liquid wash.    Goal 2: Patient will perform swallowing exercises with >80% accuracy with min cues/models.  Not addressed   ?  ?  Frequency: 5 times / week    Duration: Inpatient stay or when goals met  ?  Speech Language Pathologist: Edrick Kins, CCC-SLP  ?  Start/Stop Time: 1542 - 1700       Total Time: 78 minutes  SLP - Next Appointment: 02/08/16  Ettrick SLP FEES Charges  $FEES [phone]): 53-67 mins            Cosigned by: Lenard Galloway. Fonnie Birkenhead, SLP at 02/07/16 1727

## 2016-02-07 NOTE — Other (Signed)
IP OT EVALUATION / INITIAL TREATMENT (GENERAL)    [name] / 2001/2001-01 DOB:  Jan 12, 1956 / 60 y.o. Date:  02/07/2016     Precautions  Specific mobility precautions: Sternal precautions  Sternal Precautions: Standard     ASSESSMENT     Summary:  Pt seen for OT evaluation. Pt stating she was active PLOF and and independent with ADL's/IADL's, including driving. Pt currently demonstrating significant weakness, decreased FM coordination and decreased activity tolerance, requiring max A/dependent for ADL's and self-care tasks. Pt would benefit from continued IP OT to address decreased strength, endurance and to increase independence with daily tasks. Will continue to assess for d/c recommendations, anticipating further skilled rehab with SNF vs [name].      Activity tolerance: Tolerates 10 - 20 min activity with multiple rests  Unable to Tolerate Activity: Excessive muscle fatigue  Comments: Pt has significant weakness and decreased activity tolerance.      Precaution Awareness: Decreased awareness of precaution(s)  Correction of Errors: Self-corrects with cues     Rehab Potential   Guarded   Consult recommendations  Pt. would benefit from Discharge Planner consult.   Discharge recommendations  Primary recommendation : Too early to determine post-discharge recommendations.  Justification: Will continue to assess. Too early to determine d/c recommendations at this time.    Mobility Equipment needs: To be determined later     PLAN     Treatment Plan Frequency   Chassidy Layson will benefit from Continue per established POC.  Pt. demonstrates limitations which require skilled OT intervention., self-care instruction, instruction/review of precautions, ADL training, ROM, strengthening, therapeutic activities, therapeutic exercises, functional mobility training, energy conservation training, patient/caregiver training, DME needs assessment  1x (5 days/week) Frequency will be increased as pt's condition or  discharge plan indicates.   She will remain on the Occupational Therapy schedule until goals are met, or there has been a change in the Plan of Care.       Treatment Diagnosis Surgery / # Days Post-op (if applicable)    Primary Dx: Respiratory failure  Procedure(s) (LRB):  AORTIC VALVE REPLACEMENT; INTRA AORTIC BALLOON INSERTION (N/A) / 11 Days Post-Op       Pertinent Past Medical Hx:   Please see medical chart.      SUBJECTIVE   Pt agreeable to OT treatment. Pt appreciative for therapy.      Pain Level  Current status: Pain does not limit pt's ability to participate in therapy  Pain at end of session: 0 - No Pain   Hx of onset  List general symptoms: Weakness   Hand dominance  Hand Dominance: Ambidextrous (Writes with right hand)   Social history  Lives with: Spouse      Prior LOF  Prior ambulatory status: Walked without assistive device  Home/Community: Pt. was independent in home & community   Home environment  Living Situation: House  Home style: One level  Number of stairs to enter home: 5  Tub/Shower: Yes  ADA Height Toilet: No   Home ADL equipment  Mobility Equipment: Manual wheelchair;Front wheeled walker (FWW) (From mother using items )  ADL Equipment: Shower chair;Shower grab bars     OBJECTIVE   Reviewed chart, completed OT evaluation. B UE AAROM, x8 in all directional planes with exception to shoulder flexion only at shoulder level height.      Mental status  General Demeanor: Pleasant;Cooperative  LOC: Alert  Orientation: Oriented x 4  Directions: Follows one-step commands  --> Follows  one-step commands: Consistently  Attention span: Appears intact  Memory: Appears intact in social/therapy situations   Strength  Upper extremities: < 3/5 bilaterally  Narrative: B UE's significant weakness, unable to resistance any weight. Currently requiring AAROM for movement against gravity.     ROM  ROM  Upper extremities: Limited ROM bilaterally  Narrative: B UE AAROM WFL to shoulder level.     Sensation  Upper  extremities: Grossly intact bilaterally   Observation  Edema: B UE's and LE's edematous.     Other   Vision - Oculomotor  Specifics: No major functional visual deficits      TREATMENT  ADLs  Where assessed: Supine  Grooming: Max Assist;Increased time to complete  Toileting: Dependent  Bathing: Dependent  Upper body dress: Max Assist;Increased time to complete  Lower body dress: Dependent     Neuro   Sensation  Upper extremities: Grossly intact bilaterally  Tone  Baseline: Normal  Coordination  UEs - General: Bilateral movements are impaired  Narrative: B UE FM and GM coordination significantly impaired.    Training/Education  Trainee(s): Producer, television/film/video Learner: Patient  Primary Learner - Barriers to Learning?: Yes  Primary Learner - Specific barriers: Physical  Training provided: Role of OT, POC, B UE AAROM exercises  Response to training: Receptive   Safety/Room Set-up   Call button accessible?: Yes  Phone accessible?: Yes  Oxygen reconnected?: No, not disconnected during therapy  Position on arrival: In bed  Position on departure (bed): Bed alarm on;Bed in lowest position;4 bedrails up, as per patient status on presentation*     GOALS     Patient/Caregiver goals reviewed and integrated with rehab treatment plan:     Multidisciplinary Problems (Active)        Problem: ADLs    Goal Priority Disciplines Outcome   ADL Activity Tolerance for Self Care     OT    Description:  Pt  to tolerate 15  minutes of self care activity in seated position    Caregiver     OT    Description:  Caregiver will demonstrate the ability to assist with patient's care, as needed.           Problem: Edema    Goal Priority Disciplines Outcome   Stiffness/Joint mobility     OT    Description:  Pt's edema will be managed to prevent long term stiffness and loss of joint mobility with ROM, elevation, etc.             Problem: IP Post Cardiac Goal List    Goal Priority Disciplines Outcome   Self-Care UE     OT    Description:  Patient to  perform  upper self-care, following sternal precautions with min assistance.    Self-Care LE     OT    Description:  Patient to perform lower body self-care with ,min assistance, except TED hose.      Toilet Transfers     OT    Description:  Patient to be able to complete modified toilet transfers with min assistance.     Bath/Shower Transfers     OT    Description:  Patient to perform  modified shower transfers with  min assist.    Precautions     OT    Description:  Patient to follow sternal  precautions, while performing ADLs.           Problem: ROM    Goal Priority Disciplines Outcome  AROM     OT    Description:  Patient to increase movement in B UE joint(s) to [facility].             Problem: Safety    Goal Priority Disciplines Outcome   Safety DME     OT    Description:  Pt/Caregiver to demonstrate awareness of DME needs for safety upon discharge.           Problem: Strength    Goal Priority Disciplines Outcome   Functional Strength     OT    Description:  Patient will use both UEs bilaterally, in self- care/ADLs.      Fine Motor Tasks     OT    Description:  Pt. to be able to use upper extremities for fine motor tasks.               -  First session Second session (if applicable)    Start/Stop times: 0800 - 0841 Start/Stop times:   - Stop Time:     Total time: 41 minutes  Total time:   minutes     This note to serve as a Discharge Summary if Sequoia Witz is discharged from the hospital or from therapy services.     Therapist:  Werner Lean, OT

## 2016-02-07 NOTE — Progress Notes (Addendum)
PHARMACIST CRITICAL CARE PROGRESS NOTE     Consult Per P&T Protocol: Critical Care Physician Service, Antimicrobial Interventions, Renal Dosing, IV to PO         Summary: Brandy [name] is [name] admitted on 01/23/2016 with a NSTEMI s/p R/L heart craterization. PICC line placed 8/25. CRRT from 8/20-8/25. 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 GI bleed on PPI BID.    Allergies: Heparin and Sulfa (sulfonamide antibiotics)    PMH:  has a past medical history of History of blood transfusion.     Surgical history:  has a past surgical history that includes Tonsillectomy; Tubal ligation; Tubal ligation (1980); and ocedure (N/A, 01/27/2016).    Active problems:  ? Aortic Valve replacement  ? Acute systolic heart failure  ? NSTEMI  ? 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 Diet, NPO       Height: 167.6 cm (66) Admit weight: 101.4 kg (223 lb 8.7 oz) Last charted weight: 110.2 kg (242 lb 15.2 oz) IBW: 59.3 kg Body mass index is 39.21 kg/(m^2).    I/O last 3 completed shifts:  In: 1277 [I.V.:1107; Other:70; IV Piggyback:100]  Out: 5265 [Urine:5195; Stool:70]    Recent Labs      02/04/16   1527   02/06/16   0343  02/06/16   1950  02/07/16   0305   NA  133*   < >  134*  135  135   K  4.7   < >  4.7  4.3  4.1   CL  100   < >  101  101  100   CO2  20.9*   < >  19.9*  21.0  21.3   GLU  99   < >  109*  103*  102*   BUN  46*   < >  84*  86*  89*   CREATININES  1.67*   < >  2.76*  2.99*  3.16*   CALCIUM  9.4   < >  9.7  9.7  9.6   ALBUMIN  2.6*   --   2.3*   --   2.2*    < > = values in this interval not displayed.     Recent Labs      02/05/16   1816   CAION  1.34*     Recent Labs      02/04/16   1527   02/05/16   1816  02/06/16   0343  02/06/16   1221  02/07/16   0305   MG   --    < >  1.8  1.9  2.1   --    PHOS  3.5   --    --   4.5   --   5.2*    < > = values in this interval not displayed.     Recent Labs      02/06/16   0811  02/06/16   1222  02/06/16   1650  02/07/16   0128  02/07/16   0837  02/07/16   1221   POCGLU  103*  101*  106*  101*  88  96     Recent Labs      02/05/16   0336   02/06/16   0343  02/06/16   1221  02/06/16   1641  02/07/16   0305   WHITEBLOODCE  21.5*   < >  21.5*  24.5*  22.8*  19.1*   NEUTROABS  13.5*   --   19.6*   --    --   17.4*   BANDSABS   --    --    --    --    --   0.2   HGB  8.3*   < >  8.2*  7.8*  7.8*  7.1*   HCT  26.8*   < >  26.8*  25.1*  25.0*  23.1*   LABPLAT  98*   < >  172  187  207  247    < > = values in this interval not displayed.       Temp (24hrs), Avg:36.4 ?C (97.5 ?F), Min:36.3 ?C (97.3 ?F), Max:36.5 ?C (97.7 ?F)      Relevant Microbiology:  Date: Source: Results:  Status:   8/20 Bronc Wash  Viral/Fungal Viral panel negative; fungal in process  No bacterial growth (final) Preliminary   8/20 BAL NGTD Final   8/19 Blood x 4 NGTD Final   8/19 ET Asp NGTD Final     ASSESSMENT:     Estimated CrCl: 23.8 mL/min (CG AdjBW) per [facility]  BUN/SCr Ratio: 27.8  CRRT 8/20-5/25    Critical Care Prophylaxis:  VAP: Peridex 0.12% 15 mL BID   VTE: PLT 247    HIT positive   Argatroban infusion continues   SUP: Protonix 40 mg IV BID  GI bleed      CBG Monitoring:   ? CBG goal in critically ill: 140-180  ? CBG in past 24H: 88-101    Antimicrobial Monitoring:   Indication for antimicrobials: Sepsis of unknown origin (per Intensivist 8/19)  Possible PNA   Current antimicrobials: Levaquin 500 mg IV Q24H (8/27-8/31)   Relevant previous antimicrobials: Levaquin 750 mg IV Q24H (8/21-8/25)  Levaquin 750 mg IV Q48H (8/19)   # of days of therapy directed at source of infection or pathogen: Day 10 of 14   Comments about duration? Dependent on source  General sepsis: 7-10 days (Surviving Sepsis Campaign)   Notes about antimicrobial therapy or microbiology data?      Argatroban infusion:  ? S/p surgery, aortic valve replacement, NSTEMI, atrial fibrillation  ? Thrombosis in a lower extremity.  ? Argatroban at  0.76mcg/Kg/min (4.3ml/hr)  ? APTT = 55    PLAN:     Critical Care Prophylaxis:  ? Prophylactic medications ordered    ? PLT normal now  ? Argatroban continues  ? Monitor for s/sx of bleeding    Antimicrobial Interventions:   ? Levaquin continues for possible PNA?  ? Intensivist wants total 14 days, stop date entered     Renal Dosing:   ? No new renal dose adjustments at the moment      IV to PO:   ? No switches at the moment   ? NPO    Signed by: Carylon Perches, PharmD, BCPS, BCCCP

## 2016-02-07 NOTE — Other (Deleted)
See FEES report

## 2016-02-07 NOTE — Progress Notes (Signed)
CARDIOTHORACIC SURGERY CRITICAL CARE NOTE     Name: Brandy [name]  DOB: 1955-09-25 60 y.o.  MRN: [id number]  CSN: [id number]    Brandy [name] is a 60 y.o. female patient. Patient was admitted with Respiratory failure (CMS/HCC)  Who had Procedure(s):  AORTIC VALVE REPLACEMENT; INTRA AORTIC BALLOON INSERTION.  11 Days Post-Op    HPI: The patient is a 60 y.o. female with the below medical co-morbid conditions.    ?  1. Severe symptomatic aortic stenosis                        Ejection fraction 30%     Found on TEE intra-op to be 15%                        Normal coronaries                        Predominant symptom dyspnea                     2. Severe Pulmonary Hypertension  3. Mild to Moderate Mitral regurgitation  4. Morbid obesity                        Weight loss 50 pounds in the last one year  5. respiratory insufficiency  6. Non-ST elevation MI                        The demand ischemia  7. Iron deficiency anemia                        CT chest abdomen and pelvis reviewed no abdominal masses                        Likely AVM secondary to aortic stenosis not a good candidate for sedation and EGD or colonoscopy well aortic stenosis is so critical  ?  Patient's a 60 year old female who presented the [facility] falls in respiratory distress unable to breathe.  There are her troponin was elevated she underwent oxygen therapy and was given Lasix.  Cardiac workup showed normal coronary arteries with depressed LV function on echo and severe aortic stenosis valve area less than 0.4 velocity 4.4 m/s with an EF 30%.  She was transfused 2 units of PRBCs A [facility] hospital with a hemoglobin 8.8.  His immobility volume overload from heart failure  ?  Patient hasn't been to the doctor in years she hasn't taken any medications nor does she take good care of herself.  [place] she is unable to consistently take meds.  She likes to use and asked to [employer] wood to heat her house.    ?  After a few  days of Lasix patient states that she was breathing much better she is 60% better per her suggestion.  I had a long talk with the family about the disease course why she was getting short of breath and discussion of heart failure.  They understand that she is given a need aortic valve replacement she wishes to have tissue valve I do think she is a better surgical candidate and have her given her age and risk.  ?  She has very poor dentition    POD #1  Patient has  been stable on balloon 1:2   Methylene blue last night SVR much better   Coronary perfusion good   Today I am managing acute heart failure with management of the IABP.    POD #2  removal of the intra-aortic balloon pump renal ultrasound completed showing minimal flow to the kidneys  patient still making urine  POD #3 CRRT started  POD #4 CRRT running acid base more stable pressures much better off NEO and LEVOPHED improved making urine  POD#5 CRRT continues   Patient plt 25   Will check for DVT   Postoperative day #6 CRRT continues platelets now up to 32 treating for heparin-induced from cytopenia  Postoperative day #7 Trialysis Cath replaced secondary to malfunction.  On agatroban still hemodynamics improving oxygen requirements lower.  Postoperative day #8 making transition to hemodialysis   Postoperative day #9 +12 liters still  Will need HD likely     POD #10:  11.8 L positive. Planning hemodialysis today.  remains in A. Fib on the amiodarone protocol.  POD#11: 8 liters positive.  No other changes        Subjective: making progress    Assessment & Plan:     Active Hospital Problems    Diagnosis SNOMED CT(R) Date Noted   . Heparin induced thrombocytopenia (CMS/HCC) HEPARIN-INDUCED THROMBOCYTOPENIA 02/02/2016   . S/P AVR (aortic valve replacement) HISTORY OF AORTIC VALVE REPLACEMENT 01/27/2016   . Respiratory failure (CMS/HCC) RESPIRATORY FAILURE 01/23/2016   . Aortic stenosis, severe AORTIC VALVE STENOSIS 01/22/2016   . NSTEMI (non-ST elevated myocardial  infarction) (CMS/HCC) ACUTE NON-ST SEGMENT ELEVATION MYOCARDIAL INFARCTION 01/21/2016   . Demand ischemia of myocardium (CMS/HCC) ACUTE ISCHEMIC HEART DISEASE 01/21/2016   . Iron deficiency anemia IRON DEFICIENCY ANEMIA 01/21/2016   . Obesity OBESITY 01/21/2016     Neurologic: No issues sedated     Cardiac: Pre-op EF:   EF 15% prior to surgery   Post op 30% with good valve function 23mm valve     1. Inotropes off   2. Pressors: Levophed back up to 6 while on propofol sdation          3. Rhythm: AFIB rates better controlled on amiodarone   4. Aspirin: holding    5. Beta-Blocker: hold   6. Statin: hold   7. ACE/ARB: hold        Respiratory:    Smoker none   Pulmonary toilet    Xray: no issues      Gastrointestinal: no BM    Genitourinary: creat: pre op 0.9   Now 1.16 >2.6 up to 3.4>3.26>1.85>1.37>1.25>2.16>2.76>3.15  OFF CRRT 8/25 in AM    Infectious Disease:  WBC: 17>18> 19.4>16.8>13.5>17.7>17.3>21.5    Hematology: HGB 9.8>8.6>7.7>7.5>8.6 >8.2  PLTS 180> 91>64>32>25>32>31>32>98>172  Checked hitt  ++++   Stopped all heparin and started agatroban      Endocrine: Glucose management   Electrolytes: replacing Mg/K/Ca  Prophylaxis Agatroban PPI/H2 Blocker     Scheduled Medications:   . albuterol  2.5 mg Nebulization Q4H   . insulin aspart   Subcutaneous 6 times per day   . levofloxacin (LEVAQUIN) IV  500 mg Intravenous Q48H   . pantoprazole  40 mg Intravenous 2 times per day   . phenylephrine       . phenylephrine       . sodium chloride 0.9 % (NS) syringe  10 mL Intravenous Q8H SCH   . sodium chloride 0.9 % (NS) syringe  5-10 mL Intravenous Daily  Infusions:   . amiodarone in D5W 0.5 mg/min (02/07/16 1535)   . amiodarone in D5W Stopped (02/06/16 1547)   . amiodarone in D5W Stopped (02/06/16 1938)   . argatroban 0.8 mcg/kg/min (02/07/16 1257)   . clevidipine Stopped (02/02/16 0800)   . dexmedetomidine Stopped (02/05/16 1402)   . dextrose 10% (D10W)     . dextrose 5% lactated ringers Stopped (01/29/16 1829)   .  DOBUTamine (DOBUTREX) 250 mg in D5W 250 mL (1000 mcg/mL) infusion     . fentanyl Stopped (02/05/16 1402)   . midazolam Stopped (01/31/16 0900)   . milrinone Stopped (02/02/16 0736)   . nitroglycerin     . norepinephrine Stopped (02/05/16 1203)   . phenylephrine (NEO-SYNEPHRINE) 40 mg in NaCl 0.9 % 500 mL (80 mcg/mL) infusion **DOUBLE CONCENTRATION** Stopped (01/31/16 0515)   . propofol Stopped (02/05/16 0910)   . vasopressin (PITRESSIN) 50 units in NaCl 0.9 % 250 mL (0.2 units/mL) infusion **ADULT SHOCK** Stopped (01/28/16 1711)     Recent Labs:  Lab Results   Component Value Date    NA 135 02/07/2016    K 4.1 02/07/2016    K 3.69 01/23/2016    CL 100 02/07/2016    CL 104.1 01/23/2016    CO2 21.3 02/07/2016    CO2 20 (L) 01/23/2016    GLU 102 (H) 02/07/2016    GLU 85 01/23/2016    BUN 89 (H) 02/07/2016    CREATININES 3.16 (H) 02/07/2016    CALCIUM 9.6 02/07/2016    AST 26 02/04/2016    ALT 10 02/04/2016    ALKPHOS 121 (H) 02/04/2016    BILITOT 0.90 01/21/2016    ALBUMIN 2.2 (L) 02/07/2016    GLOB 3.0 02/04/2016    AGRATIO 0.9 (L) 02/04/2016    ANIONGAP 13.7 (H) 02/07/2016    ANIONGAP 21.0 (H) 01/23/2016    LABGLOM 15 (L) 02/07/2016    LABGLOM >60.0 01/23/2016    WHITEBLOODCE 19.1 (H) 02/07/2016    LABPLAT 247 02/07/2016    HGB 7.1 (L) 02/07/2016    PROTIME 22.3 (H) 02/03/2016    INR 2.0 (H) 02/03/2016    APTT 55 (H) 02/07/2016          Vital Signs:      ? Cardiac output   ? Cardiac Index   Temp: 37.2 ?C (99 ?F) BP: (!) 89/64 Pulse: (!) 127 Resp: 20 SpO2: 98 % on O2 Flow Rate (L/min): 40 L/min Heated high flow humidified nasal cannula   Intake/Output:     CT OUTPUT: discontinued yesterday    URINE OUTPUT: 3000    Exam:  Neuro: Intubated but follows commands  Cardiac: Atrial fibrillation irregular no murmurs  Lungs;clear  ABDOMEN:abdomen is soft without significant tenderness, masses, organomegaly or guarding  EXTREMITIES 2+ edema in the lower extremities toes appear much better digital ischemia much improved    PLAN:    Continue slow progress with diet  Needs ROM  PT  Renal situation improved  Allow MAP >75  Nutritional poor     Otho Bellows

## 2016-02-07 NOTE — Treatment Summary (Signed)
IP PT TREATMENT NOTE     [name] / 2001/2001-01 DOB:  08-Jul-1955 / 60 y.o. Date:  02/07/2016     Precautions  Specific mobility precautions: Sternal precautions  Sternal precautions: Standard     ASSESSMENT     Summary:  Pt more alert today, more active participation in therapy but still tires quickly. Able to sit EOB 7-8 min, performing ankle pumps, LAQ and reaching with UE's. SBA for sitting balance, with occasional CGA for posterior lean. Feet remain painful, standing may be very difficult due to ischemic changes. Discussed having nursing transfer pt to CAD chair later this evening. DC recs TBD (SNF vs [name])     Activity tolerance: Tolerates 10 - 20 min activity  Comments: Fatigued after sitting     Precaution Awareness: Decreased awareness of precaution(s)  Deficit Awareness: Decreased awareness of deficits  Correction of Errors: Self-corrects with cues  Safety Judgment: Decreased awareness of need for assistance       Discharge recommendations  Mobility Equipment needs: To be determined later   Primary recommendation : Patient would benefit from SNF/Nursing Home/Other Skilled Facility.  Recommended transport method:  (TBD)  Secondary recommendation: Patient would benefit from intensive rehabilitation and is expected to be able to participate in at least 3 hrs of treatment daily  Justification: Below baseline mobility with dec endurance; weakness and debility      PLAN     Treatment Plan Frequency    Plan: Continue per established POC.  Pt. demonstrates limitations which require skilled PT intervention.  1x (5 days/week).  Frequency will be increased as pt's condition or discharge plan indicates.     Brandy [name] will remain on the Physical Therapy schedule until goals are met, or there has been a change in the Plan of Care.          Treatment Diagnosis Surgery / # Days Post-op (if applicable)    Primary Dx: Resp failure  Treatment Dx: altered mobility  Procedure(s) (LRB):  AORTIC VALVE  REPLACEMENT; INTRA AORTIC BALLOON INSERTION (N/A) / 11 Days Post-Op       SUBJECTIVE   Pt agreeable to work with therapy.   RN agrees to sit, dangle EOB. Present throughout session.     Pain Level  Current status: Pain does not limit pt's ability to participate in therapy  Pain at start of session: 0 - No Pain  Pain at end of session: 2  Pain with activity: 6  Pain location: Foot (Bilateral when touched)  Pain descriptors: Sore;Sharp     OBJECTIVE   Max/dep to move to sit EOB. Once positioned able to maintain balance SBA, intermittent CGA. LE ex's in sitting, reaching with BUE although movements are weak. Dep transfer back to supine, repositioned to L side at nsg request. Call light in reach. Tolerated sitting 7-8 min.     Mental status  General Demeanor: Pleasant;Cooperative  LOC: Alert  Orientation: Oriented to:  Oriented to: Person;Place;Situation  Directions: Follows one-step commands  --> Follows multi-step commands: With repetition  --> Follows one-step commands: Consistently  Attention span: Attends, but requires cues to redirect  Memory: Decreased recall of precautions;Decreased recall of current events   Medical appliances  Medical Appliances: Oxygen;Heated high flow nasal cannula;IV;PICC line;Arterial line - radial;Central line;Catheter;Rectal Tube/[employer];ECG;BP monitor;SPO2 monitor   Vitals    HR fluctuating 120-135. Once in supine, HR spike to 145 briefly.       TREATMENT  Bed Mobility/Transfers  Bed mobility  Rolling / logroll         Transition to sitting up Supine to Sit: Max Assist;Dependent;Tech required for balance/safety   To resting position      Transition to sitting down       Transition to supine Max Assist;Dependent;Tech required for balance/safety     Scooting     Transfers      Transition to standing       Method       Ther. Ex.  LE exercises: Ankle pumps;Seated long arc quads;Quad sets  Ankle pumps: 10x2  Quad sets: 10  Short arc quads: 2x5    Balance  Balance - Basic  Static  sitting balance: Assist level  Assist level: SBA;CGA;Verbal cues  Position: Feet - supported  Time: 7-8 min   Training/Education  Trainee(s): Primary Learner  Primary Learner: Patient  Primary Learner - Barriers to Learning?: Yes  Primary Learner - Specific barriers: Physical;Cognitive  Training provided: Safety, need for sitting up  Response to training: receptive   Safety/Room Set-up   Call button accessible?: Yes  Phone accessible?: No  Oxygen reconnected?: No, not disconnected during therapy  Position on arrival: In bed  Position on departure (bed): 4 bedrails up, as per patient status on presentation*  Comments: Back to bed, positioned on L side, Call light in reach, sister present     GOALS     Patient/Caregiver goals reviewed and integrated with rehab treatment plan:     Multidisciplinary Problems (Active)        Problem: Mobility: Ambulation    Goal Priority Disciplines Outcome   Gait (with precautions)     PT    Description:  Pt. to ambulate 50 feet with one person min assist, using FWW, following any pertinent precautions.    Gait Soil scientist)     PT    Description:               Problem: Mobility: Transfers    Goal Priority Disciplines Outcome   Supine to/from sitting (with precautions)     PT    Description:  Pt. to transfer to/from sitting with one person min assist,  following any pertinent precautions.    Sitting to/from standing (with precautions)     PT    Description:  Pt. to transfer sitting to/from standing with one person min assist, following any pertinent precautions.    Bed to/from chair (with precautions)     PT    Description:  Pt. to transfer bed to/from chair with one person min assist, following any pertinent precautions.           Problem: Patient/Family Goal    Goal Priority Disciplines Outcome   Normal Status     PT    Description:  Pt. wants to return to previous level of function.     Mobility     PT    Description:  Patient wants to walk again.           Problem: Strength    Goal  Priority Disciplines Outcome   Home Exercise Program (with precautions)     PT    Description:  Pt. to perform basic HEP independently following any pertinent precautions.                       First session Second session (if applicable)    Start/Stop times: 1302 - 1335 Start/Stop times:   - Stop Time:     Total time: 52  minutes  Total time:   minutes     This note to serve as a Discharge Summary if Aidel Davisson is discharged from the hospital or from therapy services.     Therapist:  Penelope Coop, PT

## 2016-02-07 NOTE — Treatment Summary (Signed)
SPEECH LANGUAGE PATHOLOGY DAILY NOTE - INPATIENT    Patient name: Brandy [name]  Room Number: 2001  Date: 02/07/2016    ASSESSMENT   Activity Tolerance: good activity tolerance. Pt appears to have improved since yesterday. Her spouse confirmed that her thinking was better. She appeared to potentially be appropriate for diet advancement to dysphagia puree and nectar thick liquids. However, she did demonstrate an initial wet vocal quality at baseline, and her voice remains very weak and hoarse. Recommend that she participate in FEES prior to advancing diet to better determine her risk for aspiration.   Solid Diet Level: NPO  Liquid Diet Level: NPO  Medication Administration: IV  Compensatory Swallowing Strategies: oral care per policy, monitor temperature and lung sounds         PLAN   Skilled services of an SLP required to improve: swallowing function. Plan for next session: FEES Frequency of Treatment: 5x/week. Discharge Recommendations: to be determined        SUBJECTIVE   Behavior/Cognition: alert;cooperative;fatigued Patient Positioning: upright in bed Respiratory Status: high flow/Vasotherm nasal cannula Precautions/Complicating Factors: supplemental oxygen, generalized weakness, decreased endurance, aspiration risk        Family/Caregiver Present: yes - spouse/significant other    Pain Limitations: pain does not limit patient's ability to participate in therapy              OBJECTIVE   Session Activities: dysphagia.        Banesa made the following progress toward goals today:  Goals Objective Data   Goal 1: Pt will tolerate least restrictive p.o. diet consistencies with swallow WFL for safe and adequate nutrition/hydration Pt consumed 1 ice chip, 3 tsp's of water, approx 2 oz of nectar thick cranberry juice, and approx 6 tsp's of applesauce. She exhibited a wet vocal quality following water PO trials only. She tolerated sips of nectar juice and applesauce with no overt clinical s/s of aspiration.     Goal 2:  Patient will perform swallowing exercises with >80% accuracy with min cues/models.  Not addressed             Speech Language Pathologist: Edrick Kins, CCC-SLP    Start/Stop Time: 0920 - 1011       Total Time: 51 minutes  SLP - Next Appointment: 02/07/16  Donora SLP Treatment Charges  $Swallow Treatment [phone]): 38-52 mins

## 2016-02-07 NOTE — Progress Notes (Signed)
END OF SHIFT SUMMARY:      SENSORIUM:  Alert following commands on bipap   BS (and any changes with tx):  Diminished   TREATMENTS (or changes in treatment):  Q4 Albuterol   SUCTIONING (amount, color, tenacity):  none  ABG: pH: 7.41   CO2:33  PO2:122   HCO3: 21 BE:   O2 Sat: 98%  BiPAP CHANGES:  None   SBT/WEANING (settings, length of trial, toleration):  none  SKIN INTEGRITY: Intact a little red on nose   GOALS/PLAN:  Bipap, Q4 Albuterol, Abg's Prn   ADVERSE EVENTS:    COMMENTS:

## 2016-02-07 NOTE — Progress Notes (Signed)
Nutrition Follow-Up    Brandy [name] is a 60 y.o. female, DOB 1956-01-30    Subjective:  Pt was extubated 8/23 has failed SLP  Day 5 may need to restart nutrition support     Admit Anthropometric Measurements:   Ht: 167.6 cm (66)        Admit Wt: 101.4 kg (224 lbs)  IBW: 59.09 kg (130 lbs)   %IBW: 172%  BMI: 36.1 (no edema)   Ht/wt status:obese class 2              Nutrition-Focused Physical Findings:  Patient appears well-nourished and well-hydrated   Poor dentition per physician note   Nutrition risk factors: Current morbidity   Risk for nutritional deficit:high d/t current intubation       Food and Nutritional Intake:  Diet Orders:   Procedures   . Diet, NPO     PO Intake is inadequate     Other Allergies: Heparin and Sulfa (sulfonamide antibiotics)     Client History:  Admission Diagnosis: Respiratory failure (CMS/HCC) [J96.90]    Diagnosis:     SNOMED CT(R)   1. Heparin induced thrombocytopenia (CMS/HCC)  HEPARIN-INDUCED THROMBOCYTOPENIA   2. Aortic stenosis, severe  AORTIC VALVE STENOSIS   3. Acute systolic heart failure (CMS/HCC)  ACUTE SYSTOLIC HEART FAILURE   4. Demand ischemia of myocardium (CMS/HCC)  ACUTE ISCHEMIC HEART DISEASE   5. Non-ischemic cardiomyopathy (CMS/HCC)  CARDIOMYOPATHY   6. Iron deficiency anemia due to chronic blood loss  IRON DEFICIENCY ANEMIA   7. NSTEMI (non-ST elevated myocardial infarction) (CMS/HCC)  ACUTE NON-ST SEGMENT ELEVATION MYOCARDIAL INFARCTION   8. Obesity due to excess calories, unspecified obesity severity  SIMPLE OBESITY   9. S/P AVR (aortic valve replacement)  HISTORY OF AORTIC VALVE REPLACEMENT   10. Elevated brain natriuretic peptide (BNP) level  HORMONE LEVEL - FINDING   11. Tachycardia  TACHYCARDIA   12. Pleural effusion  PLEURAL EFFUSION   13. Acute respiratory failure with hypoxia (CMS/HCC)  ACUTE RESPIRATORY FAILURE   14. Thrombocytosis (CMS/HCC)  THROMBOCYTOSIS   15. Weight loss  WEIGHT DECREASED   16. Family history of cancer  FAMILY HISTORY OF CANCER      17. Cardiomegaly  CARDIOMEGALY   18. Mediastinal lymphadenopathy  MEDIASTINAL LYMPHADENOPATHY   19. Hilar lymphadenopathy- bilataeral   HILAR LYMPHADENOPATHY   20. Dyspnea and respiratory abnormality  DYSPNEA   21. Abnormal EKG  ELECTROCARDIOGRAM ABNORMAL   22. Opacity of lung on imaging study  ABNORMAL FINDINGS ON DIAGNOSTIC IMAGING OF LUNG   23. Liver lesion- small hypodense lesion around falciform lig  LESION OF LIVER   24. Respiratory failure, unspecified chronicity, unspecified whether with hypoxia or hypercapnia (CMS/HCC)  RESPIRATORY FAILURE     Past Medical History:   Diagnosis Date   . History of blood transfusion      Past Surgical History:   Procedure Laterality Date   . PROCEDURE N/A 01/27/2016    Procedure: AORTIC VALVE REPLACEMENT; INTRA AORTIC BALLOON INSERTION;  Surgeon: Otho Bellows, MD;  Location: [facility] OR;  Service: Cardiology;  Laterality: N/A;   . TONSILLECTOMY     . TUBAL LIGATION     . TUBAL LIGATION  1980       Usual meds include: Reviewed   Current meds include: Pepcid, lasix, insulin, zofran, potassium chloride      Biochemical Data, Medical Tests and Procedures:  Lab Results   Component Value Date    NA 135 02/07/2016    K 4.1 02/07/2016  CL 100 02/07/2016    CO2 21.3 02/07/2016    GLU 102 (H) 02/07/2016    BUN 89 (H) 02/07/2016    CREATININES 3.16 (H) 02/07/2016    LABCREA 77 01/28/2016    MG 2.1 02/06/2016    PHOS 5.2 (H) 02/07/2016   Phos ,BUN and creatinines elevated renal failure      Nutrition Diagnosis (Problem, Etiology, Signs and Symptoms)  Inadequate protein-energy intake related to inability to consume nutrients via oral route as evidenced by mechanical ventilation and lack of nutrition support.   (Progress:  Progressing.)    Nutrition Intervention:   Admit Wt: 101.4 kg (224 lbs)  IBW: 59.09 kg (130 lbs)    Estimated needs:  11-14 kcals/kg actual wt or 1115 - 1420 kcals  2g protein/kg IBW or 118g protein (1.2g protein/kg actual wt)    TF recommendation would be for:  Peptamen Intense VHP @ goal rate of 26mL/hr providing 1320 kcals, 121g protein, free water. Additional free water per physician.     Monitoring and Evaluation:   Will follow and monitor status, labs, and status per nutrition protocol.      Follow Up: 1-3 days      [facility], RD, LD   02/07/2016  1:24 PM  [facility]

## 2016-02-08 ENCOUNTER — Inpatient Hospital Stay: Admit: 2016-02-08 | Payer: MEDICAID

## 2016-02-08 LAB — RENAL FUNCTION PANEL
Albumin: 2.3 g/dL — ABNORMAL LOW (ref 3.5–5.0)
Anion Gap: 12.8 mmol/L — ABNORMAL HIGH (ref 3.0–11.0)
BUN: 83 mg/dL — ABNORMAL HIGH (ref 6–23)
CO2 - Carbon Dioxide: 22.2 mmol/L (ref 21.0–31.0)
Calcium: 9.5 mg/dL (ref 8.6–10.3)
Chloride: 103 mmol/L (ref 98–111)
Creatinine: 2.99 mg/dL — ABNORMAL HIGH (ref 0.55–1.10)
GFR Estimate: 16 mL/min/{1.73_m2} — ABNORMAL LOW (ref >=60)
Glucose: 98 mg/dL (ref 80–99)
Phosphorus: 4.9 mg/dL — ABNORMAL HIGH (ref 2.5–4.5)
Potassium: 3.6 mmol/L (ref 3.5–5.1)
Sodium: 138 mmol/L (ref 135–143)

## 2016-02-08 LAB — CBC WITH AUTO DIFFERENTIAL
Bands %: 2 % (ref 0–10)
Bands, Absolute: 0.4 10*3/ÂµL (ref 0.0–1.2)
Basophils %: 0 % (ref 0–2)
Basophils, Absolute: 0 10*3/ÂµL (ref 0.0–0.2)
Eosinophils %: 0 % (ref 0–7)
Eosinophils, Absolute: 0 10*3/ÂµL (ref 0.0–0.7)
HCT: 22.5 % — ABNORMAL LOW (ref 37.0–48.0)
Hemoglobin: 7.1 g/dL — ABNORMAL LOW (ref 12.0–16.0)
Lymphocytes %: 9 % — ABNORMAL LOW (ref 25–45)
Lymphocytes, Absolute: 1.7 10*3/ÂµL (ref 1.1–4.3)
MCH: 22.7 pg — ABNORMAL LOW (ref 27.0–34.0)
MCHC: 31.5 g/dL — ABNORMAL LOW (ref 32.0–36.0)
MCV: 72 fL — ABNORMAL LOW (ref 81.0–99.0)
MPV: 7.9 fL (ref 7.4–10.4)
Metamyelocytes %: 1 % — ABNORMAL HIGH
Metamyelocytes, Absolute: 0.2 10*3/ÂµL — ABNORMAL HIGH
Monocytes %: 10 % (ref 0–12)
Monocytes, Absolute: 1.9 10*3/ÂµL — ABNORMAL HIGH (ref 0.0–1.2)
Neutrophils %: 78 % — ABNORMAL HIGH (ref 35–70)
Neutrophils, Absolute: 14.5 10*3/ÂµL — ABNORMAL HIGH (ref 1.6–7.3)
Platelet Count: 404 10*3/ÂµL — ABNORMAL HIGH (ref 150–400)
Platelet Estimate: INCREASED — AB
RBC: 3.13 10*6/ÂµL — ABNORMAL LOW (ref 4.20–5.40)
RDW: 26.9 % — ABNORMAL HIGH (ref 11.5–14.5)
WBC: 18.6 10*3/ÂµL — ABNORMAL HIGH (ref 4.8–10.8)

## 2016-02-08 LAB — MAGNESIUM
Magnesium: 1.9 mg/dL (ref 1.6–2.4)
Magnesium: 2 mg/dL (ref 1.6–2.4)

## 2016-02-08 LAB — POCT GLUCOSE
POC Glucose: 105 mg/dL — ABNORMAL HIGH (ref 80–99)
POC Glucose: 107 mg/dL — ABNORMAL HIGH (ref 80–99)
POC Glucose: 135 mg/dL — ABNORMAL HIGH (ref 80–99)
POC Glucose: 95 mg/dL (ref 80–99)
POC Glucose: 97 mg/dL (ref 80–99)
POC Glucose: 97 mg/dL (ref 80–99)
POC Glucose: 98 mg/dL (ref 80–99)

## 2016-02-08 LAB — APTT: APTT: 50 Seconds — ABNORMAL HIGH (ref 23–36)

## 2016-02-08 LAB — POTASSIUM: Potassium: 3.8 mmol/L (ref 3.5–5.1)

## 2016-02-08 LAB — IONIZED CALCIUM: Ionized Calcium: 0.8 mmol/L — ABNORMAL LOW (ref 1.12–1.30)

## 2016-02-08 MED ORDER — ferric gluconate (FERRLECIT) 125 mg in sodium chloride 0.9 % (NS) 110 mL IVPB
62.5 | Freq: Every day | INTRAVENOUS | Status: AC
Start: 2016-02-08 — End: 2016-02-15
  Administered 2016-02-08 – 2016-02-15 (×16): via INTRAVENOUS

## 2016-02-08 MED ORDER — carvedilol (COREG) tablet 3.125 mg
3.125 | Freq: Two times a day (BID) | ORAL | Status: DC
Start: 2016-02-08 — End: 2016-02-10
  Administered 2016-02-08 – 2016-02-10 (×3): 3.125 mg via ORAL

## 2016-02-08 MED ORDER — potassium chloride 40 mEq in sterile water 100 mL IVPB premix **FOR CENTRAL LINE USE ONLY**
40 | Freq: Once | INTRAVENOUS | Status: AC
Start: 2016-02-08 — End: 2016-02-08
  Administered 2016-02-08: 15:00:00 40100 meq via INTRAVENOUS
  Administered 2016-02-08: 13:00:00 40 meq via INTRAVENOUS

## 2016-02-08 MED ORDER — levofloxacin (LEVAQUIN) 750 mg in D5W IVPB Premix
750 | INTRAVENOUS | Status: AC
Start: 2016-02-08 — End: 2016-02-10
  Administered 2016-02-10: 20:00:00 750150 mg via INTRAVENOUS
  Administered 2016-02-10: 19:00:00 750 mg via INTRAVENOUS

## 2016-02-08 MED ORDER — potassium chloride 40 mEq in sterile water 100 mL IVPB premix **FOR CENTRAL LINE USE ONLY**
40 | Freq: Once | INTRAVENOUS | Status: AC
Start: 2016-02-08 — End: 2016-02-08
  Administered 2016-02-08: 11:00:00 40100 meq via INTRAVENOUS
  Administered 2016-02-08: 06:00:00 40 meq via INTRAVENOUS

## 2016-02-08 MED ORDER — atorvastatin (LIPITOR) tablet 20 mg
20 | Freq: Every evening | ORAL | Status: DC
Start: 2016-02-08 — End: 2016-02-17
  Administered 2016-02-09 – 2016-02-17 (×9): 20 mg via ORAL

## 2016-02-08 MED ORDER — levofloxacin (LEVAQUIN) 250 mg in D5W IVPB Premix
250 | Freq: Once | INTRAVENOUS | Status: AC
Start: 2016-02-08 — End: 2016-02-08
  Administered 2016-02-08: 23:00:00 250 mg via INTRAVENOUS
  Administered 2016-02-08: 25050 mg via INTRAVENOUS

## 2016-02-08 MED FILL — FERRLECIT 62.5 MG/5 ML INTRAVENOUS SOLUTION: 62.5 | INTRAVENOUS | Qty: 10

## 2016-02-08 MED FILL — LEVOFLOXACIN IN D5W 500 MG/100 ML IVPB PREMIX: 500 mg/dL | INTRAVENOUS | Qty: 100

## 2016-02-08 MED FILL — LEVOFLOXACIN IN D5W 250 MG/50 ML IVPB PREMIX: 250 mg/50 mL | INTRAVENOUS | Qty: 50

## 2016-02-08 MED FILL — POTASSIUM CHLORIDE 40 MEQ/100 ML IN STERILE WATER INTRAVENOUS PIGGYBACK: 40 meq/dL | INTRAVENOUS | Qty: 100

## 2016-02-08 MED FILL — ARGATROBAN 50 MG/50 ML (1 MG/ML) IN SODIUM CHLORIDE (ISO-OSMOTIC) IV: 50 mg/50 mL (1 mg/mL) | INTRAVENOUS | Qty: 50

## 2016-02-08 MED FILL — PROTONIX 40 MG INTRAVENOUS SOLUTION: 40 mg | INTRAVENOUS | Qty: 40

## 2016-02-08 MED FILL — NEXTERONE 360 MG/200 ML (1.8 MG/ML) INTRAVENOUS SOLUTION: 360 mg/200 mL (1.8 mg/mL) | INTRAVENOUS | Qty: 360

## 2016-02-08 MED FILL — MAGNESIUM SULFATE 1 GRAM/100 ML IN DEXTROSE 5 % INTRAVENOUS PIGGYBACK: 1 gram/100 mL | INTRAVENOUS | Qty: 100

## 2016-02-08 MED FILL — CALCIUM GLUCONATE 1 GRAM/50 ML IN DEXTROSE 5% IV: 1 gram/50 mL | INTRAVENOUS | Qty: 50

## 2016-02-08 MED FILL — CARVEDILOL 3.125 MG TABLET: 3.125 mg | ORAL | Qty: 1

## 2016-02-08 NOTE — Progress Notes (Signed)
END OF SHIFT SUMMARY:      SENSORIUM:  Awake, alert and follows commands  BS (and any changes with tx):  Diminished and clear throughout with increased aeration post tx  TREATMENTS (or changes in treatment):  Albuterol q4  SUCTIONING (amount, color, tenacity):  Patient self suctiones  XRAY:  Not interpreted this shift  VENTILATOR/BiPAP CHANGES:    SBT/WEANING (settings, length of trial, toleration):    PROCEDURES:    TRANSPORTS:    ETT ADVANCED/RETRACTED/SECURE:    SKIN INTEGRITY:  GOALS/PLAN:    ADVERSE EVENTS:    COMMENTS:  Patient was titrated from 40 L/min 40% to room air with SpO2 at 94-97%. Will continue to monitor

## 2016-02-08 NOTE — Treatment Summary (Addendum)
SPEECH LANGUAGE PATHOLOGY DAILY NOTE - INPATIENT    Patient name: Brandy [name]  Room #: 2001  Date: 02/08/2016    ASSESSMENT   Activity Tolerance: good activity tolerance. Pt denied difficulties with swallowing today. She is now breathing on room air. She was still fatigued and presented with prolonged mastication of advanced solid. SLP spoke with RN who reported that pt has not appeared to have difficulty with swallowing. She appeared appropriate for diet advancement to dysphagia minced. SLP instructed pt to alternate bites and sips during PO intake, and pt agreed to implement this strategy. Per chart, pt has not exhibited any recent temperature spikes.    Solid Diet Level: Dysphagia Minced  Liquid Diet Level: thin/regular  Medication Administration: crushed, give with puree  Compensatory Swallowing Strategies: upright as possible for all oral intake, full supervision with meals, one to one assist with meals, small bites/sips, single sips only, monitor for fatigue, avoid thick/sticky foods, avoid dry, crumbly foods, moisten food with sauces, gravy, etc., oral care per policy, monitor temperature and lung sounds         PLAN   Skilled services of an SLP required to improve: swallowing function. Plan for next session: Continue swallow exercises and reassess diet tolerance Frequency of Treatment: 5x/week. Discharge Recommendations: to be determined        SUBJECTIVE   Behavior/Cognition: alert;cooperative;pleasant mood;hardworking Patient Positioning: upright in bed Respiratory Status: room air Precautions/Complicating Factors: fall risk, supplemental oxygen, generalized weakness, decreased endurance, aspiration risk        Family/Caregiver Present: no      Pain Limitations: pain does not limit patient's ability to participate in therapy              OBJECTIVE   Session Activities: dysphagia.        [place] made the following progress toward goals today:  Goals Objective Data   Goal 1: Pt will tolerate least  restrictive p.o. diet consistencies with swallow [facility] for safe and adequate nutrition/hydration Pt tolerated approx 2 oz of water via straw, 3 tsp's of applesauce, and ~1/2 of a graham cracker with no overt clinical s/s of aspiration.     Goal 2: Patient will perform swallowing exercises with >80% accuracy with min cues/models.  -Oropharyngeal exercises (lip protrusion with resistance and hard swallows) 5-10 reps each with ~80% acc and min cues from SLP.               Speech Language Pathologist: Edrick Kins, CCC-SLP    Start/Stop Time: [phone]       Total Time: 36 minutes  SLP - Next Appointment: 02/09/16  [facility] SLP Treatment Charges  $Swallow Treatment [phone]): 23-37 mins

## 2016-02-08 NOTE — Progress Notes (Signed)
Daily Progress Note     Name: Brandy [name]  DOB: Apr 14, 1956 60 y.o.  MRN: [id number]  CSN: [id number]    Assessment & Plan:     Active Hospital Problems    Diagnosis SNOMED CT(R) Date Noted   . Heparin induced thrombocytopenia (CMS/HCC) HEPARIN-INDUCED THROMBOCYTOPENIA 02/02/2016   . S/P AVR (aortic valve replacement) HISTORY OF AORTIC VALVE REPLACEMENT 01/27/2016   . Respiratory failure (CMS/HCC) RESPIRATORY FAILURE 01/23/2016   . Aortic stenosis, severe AORTIC VALVE STENOSIS 01/22/2016   . NSTEMI (non-ST elevated myocardial infarction) (CMS/HCC) ACUTE NON-ST SEGMENT ELEVATION MYOCARDIAL INFARCTION 01/21/2016   . Demand ischemia of myocardium (CMS/HCC) ACUTE ISCHEMIC HEART DISEASE 01/21/2016   . Iron deficiency anemia IRON DEFICIENCY ANEMIA 01/21/2016   . Obesity OBESITY 01/21/2016     --AKI: ATN secondary to cardiogenic shock and possible sepsis from nosocomial pneumonia.  CRRT discontinued 8/25.   -Now making urine with creatinine falling!  -Continues with good urinary output      --Pneumonia: On Levaquin, now afebrile.    Persistent leukocytosis slowly improving  --S/P AVR:  Complicated by cardiogenic shock.  Hemodynamics improved    --A-fib with RVR: Despite amiodarone.   --Heparin Induced Thrombcytopenia. 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  --Potentially remove dialysis catheter tomorrow   --Medications reviewed  --Intravenous iron for microcytic anemia  --Following closely    Subjective:     Brandy [name] is a 60 y.o. female patient. Patient was admitted with critical AS s/p AVR complicated by cardiogenic shock/AKI.  CRRT was required and then discontinued 8/25 due to high return pressures. Patient has now not been dialyzed for the last several days with signs of improving urinary output.   Remains on amiodarone for A-fib.  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   Remains extubated and off pressors.    Patient  remains alert and interactive although voice is hoarse. She has some dysphagia and is working with speech therapy.  She seems upbeat and denies chest pain or trouble breathing.    Urine output yesterday 2 L without Lasix. Creatinine appears to have peaked at 3.1 since dialysis discontinued and 2.9 today.        Scheduled Medications:   . albuterol  2.5 mg Nebulization Q4H   . insulin aspart   Subcutaneous 6 times per day   . levofloxacin (LEVAQUIN) IV  500 mg Intravenous Q48H   . pantoprazole  40 mg Intravenous 2 times per day   . phenylephrine       . phenylephrine       . sodium chloride 0.9 % (NS) syringe  10 mL Intravenous Q8H SCH   . sodium chloride 0.9 % (NS) syringe  5-10 mL Intravenous Daily     Infusions:   . amiodarone in D5W 0.5 mg/min (02/07/16 1535)   . amiodarone in D5W Stopped (02/06/16 1547)   . amiodarone in D5W 0.5 mg/min (02/08/16 0330)   . argatroban 0.8 mcg/kg/min (02/08/16 0606)   . clevidipine Stopped (02/02/16 0800)   . dexmedetomidine Stopped (02/05/16 1402)   . dextrose 10% (D10W)     . dextrose 5% lactated ringers Stopped (01/29/16 1829)   . DOBUTamine (DOBUTREX) 250 mg in D5W 250 mL (1000 mcg/mL) infusion     . midazolam Stopped (01/31/16 0900)   . milrinone Stopped (02/02/16 0736)   . nitroglycerin     . norepinephrine Stopped (02/05/16  1203)   . phenylephrine (NEO-SYNEPHRINE) 40 mg in NaCl 0.9 % 500 mL (80 mcg/mL) infusion **DOUBLE CONCENTRATION** Stopped (01/31/16 0515)   . propofol Stopped (02/05/16 0910)   . vasopressin (PITRESSIN) 50 units in NaCl 0.9 % 250 mL (0.2 units/mL) infusion **ADULT SHOCK** Stopped (01/28/16 1711)     PRN Medications: acetaminophen (TYLENOL) suppository, acetaminophen, amiodarone in D5W, amiodarone in D5W, anticoagulant citrate dextrose solution A, calcium gluconate IVPB, clevidipine, dextrose 5% lactated ringers, dextrose, DOBUTamine (DOBUTREX) 250 mg in D5W 250 mL (1000 mcg/mL) infusion, EPINEPHrine, hydrALAZINE, HYDROcodone-acetaminophen, magnesium  sulfate IVPB, magnesium sulfate IVPB, midazolam, milrinone, morphine, nitroglycerin, norepinephrine, oxyCODONE, phenylephrine (NEO-SYNEPHRINE) 40 mg in NaCl 0.9 % 500 mL (80 mcg/mL) infusion **DOUBLE CONCENTRATION**, propofol, sodium chloride, sodium chloride 0.9 % (NS) syringe, sodium chloride 0.9 % (NS) syringe    Objective:     Vital Signs:    Vital Sign Ranges for Last 24 Hours:  BP  Min: 85/56  Max: 125/74  Temp  Min: 36.2 ?C (97.2 ?F)  Max: 37.2 ?C (99 ?F)  Pulse  Min: 110  Max: 139  Resp  Min: 14  Max: 28  SpO2  Min: 82 %  Max: 99 %    Most Recent Vitals  BP: 116/93  Pulse: 127  Resp: 20  Temp: 36.2 ?C (97.2 ?F)  SpO2: 97 % (08/29 0800)    Intake/Ouput:    Intake/Output Summary (Last 24 hours) at 02/08/16 0834  Last data filed at 02/08/16 0557   Gross per 24 hour   Intake            830.9 ml   Output             1755 ml   Net           -924.1 ml        Exam:  Alert and oriented and interactive  RIJ CVC  Coarse UA BS  Atrial fibrillation rate 115  Abd: Obese, nontender  Ext: 2+ edema      Recent Labs:  Results for orders placed or performed during the hospital encounter of 01/23/16 (from the past 24 hour(s))   POCT Glucose -Next Routine    Collection Time: 02/07/16  8:37 AM   Result Value Ref Range    POC Glucose 88 80 - 99 mg/dL   POCT Glucose -Next Routine    Collection Time: 02/07/16 12:21 PM   Result Value Ref Range    POC Glucose 96 80 - 99 mg/dL   APTT -Timed    Collection Time: 02/07/16  1:25 PM   Result Value Ref Range    APTT 55 (H) 23 - 36 Seconds   POCT Glucose -Next Routine    Collection Time: 02/07/16  5:25 PM   Result Value Ref Range    POC Glucose 105 (H) 80 - 99 mg/dL   Magnesium -Next Routine    Collection Time: 02/07/16  6:29 PM   Result Value Ref Range    Magnesium 1.9 1.6 - 2.4 mg/dL   Potassium -Next Routine    Collection Time: 02/07/16  6:29 PM   Result Value Ref Range    Potassium 3.8 3.5 - 5.1 mmol/L   Ionized Calcium -Next Routine    Collection Time: 02/07/16  6:29 PM   Result Value  Ref Range    Ionized Calcium 0.80 (L) 1.12 - 1.30 mmol/L   POCT Glucose -Next Routine    Collection Time: 02/07/16  8:02 PM   Result Value Ref  Range    POC Glucose 97 80 - 99 mg/dL   POCT Glucose -Next Routine    Collection Time: 02/08/16 12:42 AM   Result Value Ref Range    POC Glucose 98 80 - 99 mg/dL   CBC with Auto Differential -Daily    Collection Time: 02/08/16  4:16 AM   Result Value Ref Range    WBC 18.6 (H) 4.8 - 10.8 10*3/?L    RBC 3.13 (L) 4.20 - 5.40 10*6/?L    Hemoglobin 7.1 (L) 12.0 - 16.0 g/dL    HCT 91.4 (L) 78.2 - 48.0 %    MCV 72.0 (L) 81.0 - 99.0 fL    MCH 22.7 (L) 27.0 - 34.0 pg    MCHC 31.5 (L) 32.0 - 36.0 g/dL    RDW 95.6 (H) 21.3 - 14.5 %    Platelet Count 404 (H) 150 - 400 10*3/?L    MPV 7.9 7.4 - 10.4 fL    Neutrophils % 78 (H) 35 - 70 %    Bands % 2 0 - 10 %    Lymphocytes % 9 (L) 25 - 45 %    Monocytes % 10 0 - 12 %    Eosinophils % 0 0 - 7 %    Basophils % 0 0 - 2 %    Metamyelocytes % 1 (H) 0 %    Neutrophils, Absolute 14.5 (H) 1.6 - 7.3 10*3/?L    Bands, Absolute 0.4 0.0 - 1.2 10*3/?L    Lymphocytes, Absolute 1.7 1.1 - 4.3 10*3/?L    Monocytes, Absolute 1.9 (H) 0.0 - 1.2 10*3/?L    Eosinophils, Absolute 0.0 0.0 - 0.7 10*3/?L    Basophils, Absolute 0.0 0.0 - 0.2 10*3/?L    Metamyelocytes, Absolute 0.2 (H) 0.0 10*3/?L    Differential Type Manual Differential     Platelet Estimate Increased (A) Normal    Anisocytosis 3+ (A) (none)    Hypochromasia 2+ (A) (none)    Microcytes 2+ (A) (none)    Polychromasia 1+ (A) (none)   POCT Glucose -Next Routine    Collection Time: 02/08/16  4:16 AM   Result Value Ref Range    POC Glucose 95 80 - 99 mg/dL   APTT -Daily    Collection Time: 02/08/16  4:17 AM   Result Value Ref Range    APTT 50 (H) 23 - 36 Seconds   Renal Function Panel -Daily    Collection Time: 02/08/16  4:17 AM   Result Value Ref Range    Albumin 2.3 (L) 3.5 - 5.0 g/dL    Calcium 9.5 8.6 - 08.6 mg/dL    Phosphorus 4.9 (H) 2.5 - 4.5 mg/dL    BUN 83 (H) 6 - 23 mg/dL    Creatinine 5.78 (H)  0.55 - 1.10 mg/dL    Sodium 469 629 - 528 mmol/L    Glucose 98 80 - 99 mg/dL    Potassium 3.6 3.5 - 5.1 mmol/L    Chloride 103 98 - 111 mmol/L    CO2 - Carbon Dioxide 22.2 21.0 - 31.0 mmol/L    Anion Gap 12.8 (H) 3.0 - 11.0 mmol/L    GFR Estimate 16 (L) >=60 mL/min/1.46m*2    GFR Additional Info     Magnesium -ASAP    Collection Time: 02/08/16  4:17 AM   Result Value Ref Range    Magnesium 2.0 1.6 - 2.4 mg/dL     Pending Labs     Order Current Status  Blood Gas-Arterial -See Comments for Frequency In process    Extra Tubes (ASA) -Next Routine In process    Red [place] -Once In process    Blood Culture -x 2 Preliminary result    Blood Culture -x 2 Preliminary result    Respiratory culture -Next Routine Preliminary result        Recent Imaging:  No results found.     LOS: 16 days     Darrel Hoover  02/08/2016  8:34 AM

## 2016-02-08 NOTE — Progress Notes (Signed)
CARDIOTHORACIC SURGERY CRITICAL CARE NOTE     Name: Brandy [name]  DOB: 07-17-1955 60 y.o.  MRN: [id number]  CSN: [id number]    Brandy [name] is a 60 y.o. female patient. Patient was admitted with Respiratory failure (CMS/HCC)  Who had Procedure(s):  AORTIC VALVE REPLACEMENT; INTRA AORTIC BALLOON INSERTION.  12 Days Post-Op    HPI: The patient is a 60 y.o. female with the below medical co-morbid conditions.    ?  1. Severe symptomatic aortic stenosis                        Ejection fraction 30%     Found on TEE intra-op to be 15%                        Normal coronaries                        Predominant symptom dyspnea                     2. Severe Pulmonary Hypertension  3. Mild to Moderate Mitral regurgitation  4. Morbid obesity                        Weight loss 50 pounds in the last one year  5. respiratory insufficiency  6. Non-ST elevation MI                        The demand ischemia  7. Iron deficiency anemia                        CT chest abdomen and pelvis reviewed no abdominal masses                        Likely AVM secondary to aortic stenosis not a good candidate for sedation and EGD or colonoscopy well aortic stenosis is so critical  ?  Patient's a 60 year old female who presented the [facility] falls in respiratory distress unable to breathe.  There are her troponin was elevated she underwent oxygen therapy and was given Lasix.  Cardiac workup showed normal coronary arteries with depressed LV function on echo and severe aortic stenosis valve area less than 0.4 velocity 4.4 m/s with an EF 30%.  She was transfused 2 units of PRBCs [facility] hospital with a hemoglobin 8.8.  His immobility volume overload from heart failure  ?  Patient hasn't been to the doctor in years she hasn't taken any medications nor does she take good care of herself.  [place] she is unable to consistently take meds.  She likes to use and asked to [employer] wood to heat her house.    ?  After a few  days of Lasix patient states that she was breathing much better she is 60% better per her suggestion.  I had a long talk with the family about the disease course why she was getting short of breath and discussion of heart failure.  They understand that she is given a need aortic valve replacement she wishes to have tissue valve I do think she is a better surgical candidate and have her given her age and risk.  ?  She has very poor dentition    POD #1  Patient has  been stable on balloon 1:2   Methylene blue last night SVR much better   Coronary perfusion good   Today I am managing acute heart failure with management of the IABP.    POD #2  removal of the intra-aortic balloon pump renal ultrasound completed showing minimal flow to the kidneys  patient still making urine  POD #3 CRRT started  POD #4 CRRT running acid base more stable pressures much better off NEO and LEVOPHED improved making urine  POD#5 CRRT continues   Patient plt 25   Will check for DVT   Postoperative day #6 CRRT continues platelets now up to 32 treating for heparin-induced from cytopenia  Postoperative day #7 Trialysis Cath replaced secondary to malfunction.  On agatroban still hemodynamics improving oxygen requirements lower.  Postoperative day #8 making transition to hemodialysis   Postoperative day #9 +12 liters still  Will need HD likely     POD #10:  11.8 L positive. Planning hemodialysis today.  remains in A. Fib on the amiodarone protocol.  POD#11: 8 liters positive.  No other changes  POD#12: still in AFIB rates 110-130   Multiple amiodarone loads without success...digoxin not good option secondary to renal failure      Subjective: making progress    Assessment & Plan:     Active Hospital Problems    Diagnosis SNOMED CT(R) Date Noted   . Heparin induced thrombocytopenia (CMS/HCC) HEPARIN-INDUCED THROMBOCYTOPENIA 02/02/2016   . S/P AVR (aortic valve replacement) HISTORY OF AORTIC VALVE REPLACEMENT 01/27/2016   . Respiratory failure  (CMS/HCC) RESPIRATORY FAILURE 01/23/2016   . Aortic stenosis, severe AORTIC VALVE STENOSIS 01/22/2016   . NSTEMI (non-ST elevated myocardial infarction) (CMS/HCC) ACUTE NON-ST SEGMENT ELEVATION MYOCARDIAL INFARCTION 01/21/2016   . Demand ischemia of myocardium (CMS/HCC) ACUTE ISCHEMIC HEART DISEASE 01/21/2016   . Iron deficiency anemia IRON DEFICIENCY ANEMIA 01/21/2016   . Obesity OBESITY 01/21/2016     Neurologic: No issues sedated     Cardiac: Pre-op EF:   EF 15% prior to surgery   Post op 30% with good valve function 23mm valve     1. Inotropes off   2. Pressors: off         3. Rhythm: AFIB    4. Aspirin: holding    5. Beta-Blocker: carvedilol    6. Statin: atorvastatin    7. ACE/ARB: hold        Respiratory:    Smoker none   Pulmonary toilet    Xray: no issues      Gastrointestinal: no BM    Genitourinary: creat: pre op 0.9   Now 1.16 >2.6 up to 3.4>3.26>1.85>1.37>1.25>2.16>2.76>3.15  OFF CRRT 8/25 in AM    Infectious Disease:  WBC: 17>18> 19.4>16.8>13.5>17.7>17.3>21.5>18     Hematology: HGB 9.8>8.6>7.7>7.5>8.6 >8.2>7.1  PLTS 180> 91>64>32>25>32>31>32>98>172>404        Checked hitt  ++++   Stopped all heparin and started agatroban      Endocrine: Glucose management   Electrolytes: replacing Mg/K/Ca  Prophylaxis Agatroban PPI/H2 Blocker     Scheduled Medications:   . [START ON 02/09/2016] albuterol  2.5 mg Nebulization 4x daily   . atorvastatin  20 mg Oral Nightly   . carvedilol  3.125 mg Oral BID with meals   . ferric gluconate (FERRLECIT) IVPB  125 mg Intravenous Daily   . insulin aspart   Subcutaneous 6 times per day   . [START ON 02/10/2016] levofloxacin (LEVAQUIN) IV  750 mg Intravenous Q48H   . pantoprazole  40 mg  Intravenous 2 times per day   . phenylephrine       . phenylephrine       . sodium chloride 0.9 % (NS) syringe  10 mL Intravenous Q8H SCH   . sodium chloride 0.9 % (NS) syringe  5-10 mL Intravenous Daily     Infusions:   . amiodarone in D5W Stopped (02/08/16 1508)   . amiodarone in D5W Stopped  (02/06/16 1547)   . amiodarone in D5W 0.5 mg/min (02/08/16 1509)   . argatroban 0.8 mcg/kg/min (02/08/16 1951)   . clevidipine Stopped (02/02/16 0800)   . dexmedetomidine Stopped (02/05/16 1402)   . dextrose 10% (D10W)     . dextrose 5% lactated ringers Stopped (01/29/16 1829)   . DOBUTamine (DOBUTREX) 250 mg in D5W 250 mL (1000 mcg/mL) infusion     . midazolam Stopped (01/31/16 0900)   . milrinone Stopped (02/02/16 0736)   . nitroglycerin     . norepinephrine Stopped (02/05/16 1203)   . phenylephrine (NEO-SYNEPHRINE) 40 mg in NaCl 0.9 % 500 mL (80 mcg/mL) infusion **DOUBLE CONCENTRATION** Stopped (01/31/16 0515)   . propofol Stopped (02/05/16 0910)   . vasopressin (PITRESSIN) 50 units in NaCl 0.9 % 250 mL (0.2 units/mL) infusion **ADULT SHOCK** Stopped (01/28/16 1711)     Recent Labs:  Lab Results   Component Value Date    NA 138 02/08/2016    K 3.4 (L) 02/08/2016    K 3.69 01/23/2016    CL 103 02/08/2016    CL 104.1 01/23/2016    CO2 22.2 02/08/2016    CO2 20 (L) 01/23/2016    GLU 98 02/08/2016    GLU 85 01/23/2016    BUN 83 (H) 02/08/2016    CREATININES 2.99 (H) 02/08/2016    CALCIUM 9.5 02/08/2016    AST 26 02/04/2016    ALT 10 02/04/2016    ALKPHOS 121 (H) 02/04/2016    BILITOT 0.90 01/21/2016    ALBUMIN 2.3 (L) 02/08/2016    GLOB 3.0 02/04/2016    AGRATIO 0.9 (L) 02/04/2016    ANIONGAP 12.8 (H) 02/08/2016    ANIONGAP 21.0 (H) 01/23/2016    LABGLOM 16 (L) 02/08/2016    LABGLOM >60.0 01/23/2016    WHITEBLOODCE 18.6 (H) 02/08/2016    LABPLAT 404 (H) 02/08/2016    HGB 7.1 (L) 02/08/2016    PROTIME 22.3 (H) 02/03/2016    INR 2.0 (H) 02/03/2016    APTT 50 (H) 02/08/2016          Vital Signs:      ? Cardiac output   ? Cardiac Index   Temp: 36.4 ?C (97.5 ?F) BP: (!) 96/50 Pulse: (!) 116 Resp: 22 SpO2: 96 % on O2 Flow Rate (L/min): 25 L/min None (Room air)   Intake/Output:     CT OUTPUT: discontinued yesterday    URINE OUTPUT: 2000  +8 literss    Exam:  Neuro: Intubated but follows commands  Cardiac: Atrial  fibrillation irregular no murmurs  Lungs;clear  ABDOMEN:abdomen is soft without significant tenderness, masses, organomegaly or guarding  EXTREMITIES 2+ edema in the lower extremities toes appear much better digital ischemia much improved    PLAN:   Improving slowly   Very weak  Working with PT  DVT and HITT on agatroban   AFIB considering cardioversion discussed with Dr. Truddie Hidden he will evaluated tomorrow and consider electrical therapy        Otho Bellows

## 2016-02-08 NOTE — Progress Notes (Signed)
Critical Care Progress Note:    Subjective: The patient is a 60 year non smoking female with BMI 40 and?progressive dyspnea since June.   ??  8/11 Admitted to [facility] ?with respiratory distress, pulmonary edema, NSTEMIC, anemia requiring 2 units PRBCs. ECHO showed severe aortic stenosis, EF 30%. Heart cath demonstrated no significant coronary occlusion. ?  ??  8/13 Transferred to [facility]   ??  8/17 Dr. Debroah Baller performed aortic valve replacement and right femoral IABP placement. Intraoperative EF was noted to be 15%. The patient was brought to the CCU post operatively. She required multiple vasopressors and inotropes and also received methylene blue. Nebulized flolan for pHTN.   ??  8/18 IABP was removed.   ??  8/19 Swan clotted and was removed. Levaquin started. Flolan stopped.   ??  8/20 Temp HD catheter was placed on and CRRT initiated. Bronchoscopy performed due to fever. Foley removed, new foley placed.   ??  8/21 Still?intubated. Milrinone 0.375 mcg/kg/min,?levophed at 10 mcg/min. CRRT. Due to ischemic appearing digits right radial aline removed, left radial aline placed. No purposeful response during sedation holiday. Enteral feeds started.   ??  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 [place] confirmed RLE DVT. Patient remained?intubated on levophed, milrinone with?CRRT running. She received IV amiodarone for afib. IV hydrocortisone started given persistence of circulatory shock (requiring 9 mcg/min levophed plus milrinone).  ??  8/23 Patient awakened with sedation holiday. Spontaneous breathing attempted, appeared weak for extubation. ?Milrinone has been weaned off, levophed is down to 3 mcg/min. ECHO: EF30-35%, AV prosthesis OK, moderate mitral regurgitation, evidence of RV pressure or volume overload. ?.   ??  8/24 Patient awakened, followed commands. Left IJ dialysis catheter stopped?functioning. LUE?PICC placed, right IJ TLC removed, ew right IJ temporary HD cath placed, left IJ temp HD cath removed.  ??  ??  8/25 Still appeared too weak for extubation.   ??  8/26 The patient passed some bloody stools.Extubation. ?Post extubation she required bipap alternating with heat high flow NC O2. Reported pain in her feet.   ??  8/27: had good response to lasix. As urine output is improving, no plans for dialysis. Respiratory status appears improved. Failed SLP.  ?  8/28: Continues on amiodarone infusion with afib and vent rates of about 120bpm. Worked with PT this morning. 'Breathing fine'. Strong productive cough. Passed FEES study for pureed diet.    This morning she is on room air!!  Continues to make good urine with improving creatinine.    Medications: Reviewed    Objective:    Physical Exam:  Vitals:    02/08/16 0915   BP:    Pulse: (!) 127   Resp: 16   Temp:    SpO2: 95%     Gen: 60 y.o.yr old ?female?resting comfortably in bed.  HEENT: Atraumatic, room air  Neck: Supple  Chest: sternotomy  Lungs: clear to ausculation  Cardiac: afib with variable ventricular rate.  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  ??  Plan:    Neurologic: PT/OT. Analgesia as required.   ??  Pulmonary: Pulmonary status has improved with diuresis. Room air.   ??  Cardiac: Plan per CT surgery. Consider adding beta blocker?    ID: Levaquin day #11. WBC 18. Afebrile. D/C Levaquin after 3 more doses. If she develops fever and/or progressive leukocytosis, blood cultures should be  obtained.    Renal: Continue foley for precise monitoring of urine output and to protect skin integrity in this morbidly obese, moribund critically ill patient with renal failure, circulatory overload and continued requirement for diuresis. HD on hold as renal recovery expected. Possibly remove HD catheter tomorrow.  ??  GI: Pureed diet per SLP - strict aspiration precautions. Given her iron deficiency anemia on admission, blood per rectum during hospitalization, she will need colonoscopy at some point when more clinically stable. She will need GI consult and urgent endoscopy if her H/H drops more precipitously.   ??  Endocrine: Insulin sliding scale as needed to maintain euglycemia.   ??  Heme: H/H 7.1????Platelets 404. ?Argatroban for HIT. Thrombocytopenia resolved. Famotidine switched to BID IV pantoprazole on 8/26 given GI bleeding.   ??  Code Status: full  ?  Critical care time spent: 30 mins    Kaydie Petsch Camillo Flaming

## 2016-02-08 NOTE — Progress Notes (Signed)
RESPIRATORY ASSESSMENT    SUBJECTIVE: patient states breathing good        OBJECTIVE:   Current Vital Signs:    Pre and post C/S: clear and diminished   HR:  124   RR: 20               Cough: strong productive cough white light yellow   SpO2:  Room air 95%            ASSESSMENT:  Patient benefits from treatments increased aeration productive cough       PLAN: rt to administer qid 2.5mg  albuterol       GOAL(S):  Medication:   Mobilize secretions    Decrease work of breathing    Maximize airflow      Pulmonary/Bronchial Hygiene:  Improve mucocilliary clearance  Prevent or correct atelectasis      Supplemental Oxygen:   Maintain SpO2 >90   %  Normalize and maintain an adequate SpO2/PaO2 appropriate for patient clinical situation    Per PDP # 102 and MD orders    E.S.  Horald Pollen, RRT

## 2016-02-08 NOTE — Progress Notes (Addendum)
PHARMACIST CRITICAL CARE PROGRESS NOTE     Consult Per P&T Protocol: Critical Care Physician Service, Antimicrobial Interventions, Renal Dosing, IV to PO         Summary: Brandy [name] is a 59 y.o. Female admitted on 01/23/2016 with a NSTEMI s/p R/L heart craterization. PICC line placed 8/25. CRRT from 8/20-8/25. 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 GI bleed on PPI BID.    Allergies: Heparin and Sulfa (sulfonamide antibiotics)    PMH:  has a past medical history of History of blood transfusion.     Surgical history:  has a past surgical history that includes Tonsillectomy; Tubal ligation; Tubal ligation (1980); and ocedure (N/A, 01/27/2016).    Active problems:  ? Aortic Valve replacement  ? Acute systolic heart failure  ? NSTEMI  ? 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 Diet Dysphagia Minced; No - patient to receive automatic trays       Height: 167.6 cm (66) Admit weight: 101.4 kg (223 lb 8.7 oz) Last charted weight: 107.3 kg (236 lb 8.9 oz) IBW: 59.3 kg Body mass index is 38.18 kg/(m^2).    I/O last 3 completed shifts:  In: 1136.7 [I.V.:1106.7; Other:30]  Out: 3475 [Urine:3455; Stool:20]    Recent Labs      02/06/16   0343  02/06/16   1950  02/07/16   0305  02/07/16   1829  02/08/16   0417   NA  134*  135  135   --   138   K  4.7  4.3  4.1  3.8  3.6   CL  101  101  100   --   103   CO2  19.9*  21.0  21.3   --   22.2   GLU  109*  103*  102*   --   98   BUN  84*  86*  89*   --   83*   CREATININES  2.76*  2.99*  3.16*   --   2.99*   CALCIUM  9.7  9.7  9.6   --   9.5   ALBUMIN  2.3*   --   2.2*   --   2.3*     Recent Labs      02/05/16   1816  02/07/16   1829   CAION  1.34*  0.80*     Recent Labs      02/06/16   0343  02/06/16   1221  02/07/16   0305  02/07/16   1829  02/08/16   0417   MG  1.9  2.1   --   1.9  2.0   PHOS  4.5   --   5.2*   --   4.9*     Recent Labs      02/07/16   1725  02/07/16   2002  02/08/16   0042  02/08/16   0416  02/08/16   0913  02/08/16   1411   POCGLU  105*  97  98  95  97  135*     Recent Labs      02/06/16   0343   02/06/16   1641  02/07/16   0305  02/08/16   0416   WHITEBLOODCE  21.5*   < >  22.8*  19.1*  18.6*   NEUTROABS  19.6*   --    --  17.4*  14.5*   BANDSABS   --    --    --   0.2  0.4   HGB  8.2*   < >  7.8*  7.1*  7.1*   HCT  26.8*   < >  25.0*  23.1*  22.5*   LABPLAT  172   < >  207  247  404*    < > = values in this interval not displayed.       Temp (24hrs), Avg:36.4 ?C (97.5 ?F), Min:36.2 ?C (97.2 ?F), Max:36.6 ?C (97.9 ?F)      Relevant Microbiology:  Date: Source: Results:  Status:   8/20 Bronc Wash  Viral/Fungal Viral panel negative; fungal in process  No bacterial growth (final) Preliminary   8/20 BAL NGTD Final   8/19 Blood x 4 NGTD Final   8/19 ET Asp NGTD Final     ASSESSMENT:     Estimated CrCl: 24.8 mL/min (CG AdjBW) per [employer]  BUN/SCr Ratio: 27.8     CRRT 8/20-5/25    Critical Care Prophylaxis:  VAP: Peridex 0.12% 15 mL BID   VTE: HIT positive   Argatroban infusion continues   SUP: Protonix 40 mg IV BID  GI bleed 8/26     CBG Monitoring:   ? CBG goal in critically ill: 140-180  ? CBG in past 24H: 96-135    Antimicrobial Monitoring:   Indication for antimicrobials: Sepsis of unknown origin (per Intensivist 8/19)  Possible PNA   Current antimicrobials: Levaquin 750 mg IV Q48H (8/29-8/31)   Relevant previous antimicrobials: Levaquin 500 mg IV Q48H (8/27-8/28)  Levaquin 750 mg IV Q24H (8/21-8/25)  Levaquin 750 mg IV Q48H (8/19)     # of days of therapy directed at source of infection or pathogen: Day 11 of 14   Comments about duration? Dependent on source  General sepsis: 7-10 days (Surviving Sepsis Campaign)   Notes about antimicrobial therapy or microbiology data?      Argatroban infusion:  ? S/p surgery, aortic valve replacement, NSTEMI, atrial fibrillation  ? Thrombosis in a lower extremity  ? Argatroban at 0.8 mcg/Kg/min (4.84ml/hr)  ? APTT = 50    PLAN:     Critical  Care Prophylaxis:  ? Prophylactic medications ordered    ? PLT > 150K  ? Argatroban continues  ? Monitor for s/sx of bleeding    Antimicrobial Interventions:   ? Levaquin continues for possible PNA?  ? Intensivist wants total 14 days, stop date entered     Renal Dosing:   ? No new renal dose adjustments at the moment      IV to PO:   ? Per Intensivist note 8/29: strict aspiration precautions    Signed by: Carylon Perches, PharmD, BCPS, BCCCP

## 2016-02-09 ENCOUNTER — Inpatient Hospital Stay: Admit: 2016-02-09 | Payer: MEDICAID

## 2016-02-09 LAB — RENAL FUNCTION PANEL
Albumin: 2.5 g/dL — ABNORMAL LOW (ref 3.5–5.0)
Anion Gap: 12.4 mmol/L — ABNORMAL HIGH (ref 3.0–11.0)
BUN: 73 mg/dL — ABNORMAL HIGH (ref 6–23)
CO2 - Carbon Dioxide: 22.6 mmol/L (ref 21.0–31.0)
Calcium: 9.3 mg/dL (ref 8.6–10.3)
Chloride: 105 mmol/L (ref 98–111)
Creatinine: 2.4 mg/dL — ABNORMAL HIGH (ref 0.55–1.10)
GFR Estimate: 21 mL/min/{1.73_m2} — ABNORMAL LOW (ref >=60)
Glucose: 102 mg/dL — ABNORMAL HIGH (ref 80–99)
Phosphorus: 3.5 mg/dL (ref 2.5–4.5)
Potassium: 3.3 mmol/L — ABNORMAL LOW (ref 3.5–5.1)
Sodium: 140 mmol/L (ref 135–143)

## 2016-02-09 LAB — CBC WITH AUTO DIFFERENTIAL
Bands %: 1 % (ref 0–10)
Bands, Absolute: 0.2 10*3/ÂµL (ref 0.0–1.2)
Basophils %: 0 % (ref 0–2)
Basophils, Absolute: 0 10*3/ÂµL (ref 0.0–0.2)
Eosinophils %: 1 % (ref 0–7)
Eosinophils, Absolute: 0.2 10*3/ÂµL (ref 0.0–0.7)
HCT: 22.9 % — ABNORMAL LOW (ref 37.0–48.0)
Hemoglobin: 7.1 g/dL — ABNORMAL LOW (ref 12.0–16.0)
Lymphocytes %: 8 % — ABNORMAL LOW (ref 25–45)
Lymphocytes, Absolute: 1.2 10*3/ÂµL (ref 1.1–4.3)
MCH: 22.5 pg — ABNORMAL LOW (ref 27.0–34.0)
MCHC: 31.1 g/dL — ABNORMAL LOW (ref 32.0–36.0)
MCV: 72.2 fL — ABNORMAL LOW (ref 81.0–99.0)
MPV: 7.8 fL (ref 7.4–10.4)
Metamyelocytes %: 1 % — ABNORMAL HIGH
Metamyelocytes, Absolute: 0.2 10*3/ÂµL — ABNORMAL HIGH
Monocytes %: 4 % (ref 0–12)
Monocytes, Absolute: 0.6 10*3/ÂµL (ref 0.0–1.2)
Neutrophils %: 85 % — ABNORMAL HIGH (ref 35–70)
Neutrophils, Absolute: 13.2 10*3/ÂµL — ABNORMAL HIGH (ref 1.6–7.3)
Nucleated Red Blood Cells %: 1 % — ABNORMAL HIGH (ref <1.0)
Platelet Count: 426 10*3/ÂµL — ABNORMAL HIGH (ref 150–400)
Platelet Estimate: DECREASED — AB
RBC: 3.17 10*6/ÂµL — ABNORMAL LOW (ref 4.20–5.40)
RDW: 26.8 % — ABNORMAL HIGH (ref 11.5–14.5)
WBC: 15.5 10*3/ÂµL — ABNORMAL HIGH (ref 4.8–10.8)

## 2016-02-09 LAB — POCT GLUCOSE
POC Glucose: 88 mg/dL (ref 80–99)
POC Glucose: 105 mg/dL — ABNORMAL HIGH (ref 80–99)
POC Glucose: 105 mg/dL — ABNORMAL HIGH (ref 80–99)
POC Glucose: 96 mg/dL (ref 80–99)
POC Glucose: 105 mg/dL — ABNORMAL HIGH (ref 80–99)

## 2016-02-09 LAB — MAGNESIUM
Magnesium: 1.8 mg/dL (ref 1.6–2.4)
Magnesium: 1.8 mg/dL (ref 1.6–2.4)
Magnesium: 1.9 mg/dL (ref 1.6–2.4)

## 2016-02-09 LAB — POTASSIUM
Potassium: 3.4 mmol/L — ABNORMAL LOW (ref 3.5–5.1)
Potassium: 3.5 mmol/L (ref 3.5–5.1)

## 2016-02-09 LAB — APTT: APTT: 58 Seconds — ABNORMAL HIGH (ref 23–36)

## 2016-02-09 MED ORDER — albuterol (PROVENTIL) nebulizer solution 2.5 mg
2.5 | Freq: Four times a day (QID) | RESPIRATORY_TRACT | Status: DC
Start: 2016-02-09 — End: 2016-02-14
  Administered 2016-02-09 – 2016-02-14 (×23): 2.5 mg via RESPIRATORY_TRACT

## 2016-02-09 MED ORDER — potassium chloride 40 mEq in sterile water 100 mL IVPB premix **FOR CENTRAL LINE USE ONLY**
40 | Freq: Once | INTRAVENOUS | Status: AC
Start: 2016-02-09 — End: 2016-02-09
  Administered 2016-02-09 (×3): 40100 meq via INTRAVENOUS
  Administered 2016-02-09: 13:00:00 40 meq via INTRAVENOUS
  Administered 2016-02-09: 17:00:00 40100 meq via INTRAVENOUS

## 2016-02-09 MED ORDER — potassium chloride 40 mEq in sterile water 100 mL IVPB premix **FOR CENTRAL LINE USE ONLY**
40 | Freq: Once | INTRAVENOUS | Status: AC
Start: 2016-02-09 — End: 2016-02-09
  Administered 2016-02-09: 07:00:00 40100 meq via INTRAVENOUS
  Administered 2016-02-09: 05:00:00 40 meq via INTRAVENOUS

## 2016-02-09 MED ORDER — nystatin (MYCOSTATIN) powder
100000 | Freq: Two times a day (BID) | TOPICAL | Status: DC
Start: 2016-02-09 — End: 2016-02-15
  Administered 2016-02-09 – 2016-02-15 (×12): via TOPICAL

## 2016-02-09 MED ORDER — propofol (DIPRIVAN) injection
10 | INTRAVENOUS | Status: AC | PRN
Start: 2016-02-09 — End: 2016-02-09
  Administered 2016-02-09 (×2): 10 mg/mL via INTRAVENOUS

## 2016-02-09 MED ORDER — potassium chloride SA (K-DUR,KLOR-CON) CR tablet 40 mEq
10 | Freq: Once | ORAL | Status: AC
Start: 2016-02-09 — End: 2016-02-09
  Administered 2016-02-09: 10 meq via ORAL

## 2016-02-09 MED FILL — KLOR-CON M10 MEQ TABLET,EXTENDED RELEASE: 10 meq | ORAL | Qty: 4

## 2016-02-09 MED FILL — ARGATROBAN 50 MG/50 ML (1 MG/ML) IN SODIUM CHLORIDE (ISO-OSMOTIC) IV: 50 mg/50 mL (1 mg/mL) | INTRAVENOUS | Qty: 50

## 2016-02-09 MED FILL — PROTONIX 40 MG INTRAVENOUS SOLUTION: 40 mg | INTRAVENOUS | Qty: 40

## 2016-02-09 MED FILL — ALBUTEROL SULFATE 2.5 MG/3 ML (0.083 %) SOLUTION FOR NEBULIZATION: 2.5 mg/3 mL (0.083 %) | RESPIRATORY_TRACT | Qty: 3

## 2016-02-09 MED FILL — NYSTOP 100,000 UNIT/GRAM TOPICAL POWDER: 100000 [IU]/g | TOPICAL | Qty: 15

## 2016-02-09 MED FILL — POTASSIUM CHLORIDE 40 MEQ/100 ML IN STERILE WATER INTRAVENOUS PIGGYBACK: 40 meq/dL | INTRAVENOUS | Qty: 100

## 2016-02-09 MED FILL — NEXTERONE 360 MG/200 ML (1.8 MG/ML) INTRAVENOUS SOLUTION: 360 mg/200 mL (1.8 mg/mL) | INTRAVENOUS | Qty: 360

## 2016-02-09 MED FILL — FERRLECIT 62.5 MG/5 ML INTRAVENOUS SOLUTION: 62.5 | INTRAVENOUS | Qty: 10

## 2016-02-09 MED FILL — MAGNESIUM SULFATE 1 GRAM/100 ML IN DEXTROSE 5 % INTRAVENOUS PIGGYBACK: 1 gram/100 mL | INTRAVENOUS | Qty: 100

## 2016-02-09 MED FILL — ATORVASTATIN 20 MG TABLET: 20 mg | ORAL | Qty: 1

## 2016-02-09 MED FILL — CARVEDILOL 3.125 MG TABLET: 3.125 mg | ORAL | Qty: 1

## 2016-02-09 MED FILL — DIPRIVAN 10 MG/ML INTRAVENOUS EMULSION: 10 mg/mL | INTRAVENOUS | Qty: 100

## 2016-02-09 NOTE — Other (Signed)
IP PT TREATMENT NOTE (NOT SEEN)    [name] DOB:  Sep 23, 1955 / 60 y.o. Date:  02/09/2016     ASSESSMENT     No treatment provided.  Brandy [name] was not seen for the following reason:    Procedure/unavailable: Pt awaiting cardioversion.  Heart rate 120's 130's at rest.  Will check back in pm     PLAN     Brandy [name] will remain on the Physical Therapy schedule until goals are met, or there has been a change in the Plan of Care.          Treatment Diagnosis Surgery / # Days Post-op (if applicable)    Primary Dx: Resp failure  Treatment Dx: altered mobility  Procedure(s) (LRB):  AORTIC VALVE REPLACEMENT; INTRA AORTIC BALLOON INSERTION (N/A) / 13 Days Post-Op       SUBJECTIVE        OBJECTIVE   NA          Session times   Start/Stop Times:   -     Total Time:   minutes      This note to serve as a Discharge Summary if Brandy [name] is discharged from the hospital or from therapy services.     Therapist:  Humphrey Rolls, PTA

## 2016-02-09 NOTE — Progress Notes (Signed)
Nutrition Follow-Up    Brandy [name] is a 60 y.o. female, DOB 02-01-1956    Subjective:  Diet advanced by SLP to Dys. Minced diet. Recent intake values reflect: 25-50% meals. Pt reports she is tolerating meals currently. Pt is enjoying fruit. Pt likes the strawberry shakes she is being sent and would like to continue these with protein supplementation.     Admit Anthropometric Measurements:   Ht: 167.6 cm (66)        Admit Wt: 101.4 kg (224 lbs)  IBW: 59.09 kg (130 lbs)   %IBW: 172%  BMI: 36.1 (no edema)   Ht/wt status:obese class 2              Nutrition-Focused Physical Findings:  Patient appears well-nourished and well-hydrated   Poor dentition per physician note   Nutrition risk factors: Current morbidity   Risk for nutritional deficit:high       Food and Nutritional Intake:  Diet Orders:   Procedures   . Diet Dysphagia Minced; No - patient to receive automatic trays     PO Intake is inadequate     Other Allergies: Heparin and Sulfa (sulfonamide antibiotics)     Client History:  Admission Diagnosis: Respiratory failure (CMS/HCC) [J96.90]    Diagnosis:     SNOMED CT(R)   1. Heparin induced thrombocytopenia (CMS/HCC)  HEPARIN-INDUCED THROMBOCYTOPENIA   2. Aortic stenosis, severe  AORTIC VALVE STENOSIS   3. Acute systolic heart failure (CMS/HCC)  ACUTE SYSTOLIC HEART FAILURE   4. Demand ischemia of myocardium (CMS/HCC)  ACUTE ISCHEMIC HEART DISEASE   5. Non-ischemic cardiomyopathy (CMS/HCC)  CARDIOMYOPATHY   6. Iron deficiency anemia due to chronic blood loss  IRON DEFICIENCY ANEMIA   7. NSTEMI (non-ST elevated myocardial infarction) (CMS/HCC)  ACUTE NON-ST SEGMENT ELEVATION MYOCARDIAL INFARCTION   8. Obesity due to excess calories, unspecified obesity severity  SIMPLE OBESITY   9. S/P AVR (aortic valve replacement)  HISTORY OF AORTIC VALVE REPLACEMENT   10. Elevated brain natriuretic peptide (BNP) level  HORMONE LEVEL - FINDING   11. Tachycardia  TACHYCARDIA   12. Pleural effusion  PLEURAL EFFUSION   13. Acute  respiratory failure with hypoxia (CMS/HCC)  ACUTE RESPIRATORY FAILURE   14. Thrombocytosis (CMS/HCC)  THROMBOCYTOSIS   15. Weight loss  WEIGHT DECREASED   16. Family history of cancer  FAMILY HISTORY OF CANCER   17. Cardiomegaly  CARDIOMEGALY   18. Mediastinal lymphadenopathy  MEDIASTINAL LYMPHADENOPATHY   19. Hilar lymphadenopathy- bilataeral   HILAR LYMPHADENOPATHY   20. Dyspnea and respiratory abnormality  DYSPNEA   21. Abnormal EKG  ELECTROCARDIOGRAM ABNORMAL   22. Opacity of lung on imaging study  ABNORMAL FINDINGS ON DIAGNOSTIC IMAGING OF LUNG   23. Liver lesion- small hypodense lesion around falciform lig  LESION OF LIVER   24. Respiratory failure, unspecified chronicity, unspecified whether with hypoxia or hypercapnia (CMS/HCC)  RESPIRATORY FAILURE     Past Medical History:   Diagnosis Date   . History of blood transfusion      Past Surgical History:   Procedure Laterality Date   . PROCEDURE N/A 01/27/2016    Procedure: AORTIC VALVE REPLACEMENT; INTRA AORTIC BALLOON INSERTION;  Surgeon: Otho Bellows, MD;  Location: [facility] OR;  Service: Cardiology;  Laterality: N/A;   . TONSILLECTOMY     . TUBAL LIGATION     . TUBAL LIGATION  1980       Usual meds include: Reviewed   Current meds include: Pepcid, lasix, insulin, zofran, potassium chloride  Biochemical Data, Medical Tests and Procedures:  Lab Results   Component Value Date    NA 140 02/09/2016    K 3.3 (L) 02/09/2016    CL 105 02/09/2016    CO2 22.6 02/09/2016    GLU 102 (H) 02/09/2016    BUN 73 (H) 02/09/2016    CREATININES 2.40 (H) 02/09/2016    LABCREA 77 01/28/2016    MG 1.8 02/09/2016    PHOS 3.5 02/09/2016   BUN and creatinine elevated renal failure      Nutrition Diagnosis (Problem, Etiology, Signs and Symptoms)  Inadequate protein-energy intake related to inability to consume nutrients via oral route as evidenced by mechanical ventilation and lack of nutrition support.   (Progress:  Progressing.)    Nutrition Intervention:   -Continue current  diet per SLP; will follow for PO adequacy  -Will order strawberry smoothie with Beneprotein BID     Monitoring and Evaluation:   Will follow and monitor status, labs, and status per nutrition protocol.      Follow Up: 1-3 days      Laqueta Due  02/09/2016  1:54 PM  [facility]

## 2016-02-09 NOTE — Progress Notes (Signed)
PHARMACIST CRITICAL CARE PROGRESS NOTE     Consult Per P&T Protocol: Critical Care Physician Service, Antimicrobial Interventions, Renal Dosing, IV to PO         Summary: Brandy [name] is [name] admitted on 01/23/2016 with a NSTEMI s/p R/L heart craterization. PICC line placed 8/25. CRRT from 8/20-8/25. 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 GI bleed on PPI BID.    Allergies: Heparin and Sulfa (sulfonamide antibiotics)    PMH:  has a past medical history of History of blood transfusion.     Surgical history:  has a past surgical history that includes Tonsillectomy; Tubal ligation; Tubal ligation (1980); and ocedure (N/A, 01/27/2016).    Active problems:  ? Aortic Valve replacement  ? Acute systolic heart failure  ? NSTEMI  ? 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 Diet Dysphagia Minced; No - patient to receive automatic trays       Height: 167.6 cm (66) Admit weight: 101.4 kg (223 lb 8.7 oz) Last charted weight: 106.1 kg (233 lb 14.5 oz) IBW: 59.3 kg Body mass index is 37.75 kg/(m^2).    I/O last 3 completed shifts:  In: 1717.2 [P.O.:440; I.V.:1147.2; Other:30; IV Piggyback:100]  Out: 2935 [Urine:2935]    Recent Labs      02/07/16   0305   02/08/16   0417  02/08/16   2024  02/09/16   0356   NA  135   --   138   --   140   K  4.1   < >  3.6  3.4*  3.3*   CL  100   --   103   --   105   CO2  21.3   --   22.2   --   22.6   GLU  102*   --   98   --   102*   BUN  89*   --   83*   --   73*   CREATININES  3.16*   --   2.99*   --   2.40*   CALCIUM  9.6   --   9.5   --   9.3   ALBUMIN  2.2*   --   2.3*   --   2.5*    < > = values in this interval not displayed.     Recent Labs      02/07/16   1829   CAION  0.80*     Recent Labs      02/07/16   0305   02/08/16   0417  02/08/16   2024  02/09/16   0356   MG   --    < >  2.0  1.8  1.8   PHOS  5.2*   --   4.9*   --   3.5    < > = values in this interval not displayed.          Recent Labs      02/08/16   1411  02/08/16   1611  02/08/16   2005  02/08/16   2312  02/09/16   0837  02/09/16   1158   POCGLU  135*  107*  88  105*  105*  96     Recent Labs      02/07/16   0305  02/08/16  0416  02/09/16   0355   WHITEBLOODCE  19.1*  18.6*  15.5*   NEUTROABS  17.4*  14.5*  13.2*   BANDSABS  0.2  0.4  0.2   HGB  7.1*  7.1*  7.1*   HCT  23.1*  22.5*  22.9*   LABPLAT  247  404*  426*       Temp (24hrs), Avg:36.6 ?C (97.9 ?F), Min:36.4 ?C (97.5 ?F), Max:36.8 ?C (98.2 ?F)      Relevant Microbiology:  Date: Source: Results:  Status:   8/26 Sputum 1+ yeast, not further work-up Final   8/20 Bronc Wash  Viral/Fungal Viral panel negative; fungal in process  No bacterial growth (final) Preliminary   8/20 BAL NGTD Final   8/19 Blood x 4 NGTD Final   8/19 ET Asp NGTD Final     ASSESSMENT:     Estimated CrCl: 30.7 mL/min (CG AdjBW) per [employer] [facility]  BUN/SCr Ratio:  30.4    CRRT 8/20-5/25    Critical Care Prophylaxis:  VAP: Peridex 0.12% 15 mL BID   VTE: HIT positive   Argatroban infusion continues   SUP: Protonix 40 mg IV BID  GI bleed 8/26     CBG Monitoring:   ? CBG goal in critically ill: 140-180  ? CBG in past 24H: 88-135    Antimicrobial Monitoring:   Indication for antimicrobials: Sepsis of unknown origin (per Intensivist 8/19)  Possible PNA   Current antimicrobials: Levaquin 750 mg IV Q48H (8/29-8/31)   Relevant previous antimicrobials: Levaquin 500 mg IV Q48H (8/27-8/28)  Levaquin 750 mg IV Q24H (8/21-8/25)  Levaquin 750 mg IV Q48H (8/19)     # of days of therapy directed at source of infection or pathogen: Day 12 of 14   Comments about duration? Dependent on source  General sepsis: 7-10 days (Surviving Sepsis Campaign)   Notes about antimicrobial therapy or microbiology data?      Argatroban infusion:  ? S/p surgery, aortic valve replacement, NSTEMI, atrial fibrillation  ? Thrombosis in a lower extremity  ? Argatroban at 0.8 mcg/kg/min   ? APTT = 58    PLAN:     Critical Care  Prophylaxis:  ? Prophylactic medications ordered    ? PLT > 150K  ? Argatroban continues (HIT positive with RLE DVT)  ? Monitor for s/sx of bleeding    Antimicrobial Interventions:   ? Levaquin continues for possible PNA?  ? Intensivist wants total 14 days, stop date entered     Renal Dosing:   ? No new renal dose adjustments at the moment      IV to PO:   ? Per Intensivist note 8/29: strict aspiration precautions    Signed by: Carylon Perches, PharmD, BCPS, BCCCP

## 2016-02-09 NOTE — Treatment Summary (Signed)
IP PT TREATMENT NOTE     [name] / [birth year] DOB:  1956-04-03 / 60 y.o. Date:  02/09/2016     Precautions  Specific mobility precautions: Sternal precautions  Sternal precautions: Standard  Required Braces/Orthotics: None     ASSESSMENT     Summary:  Pt did well, feet not as sore, no obvious discoloration of ball of foot, only partial toes, demarcating. Pt very motivated.  Continues to improve daily, discussed back to bed with hoyer for safety.       Activity tolerance: Tolerates 30 min activity     Precaution Awareness: Fully aware of precaution(s)  Deficit Awareness: Decreased awareness of deficits  Correction of Errors: Self-corrects with cues  Safety Judgment: Decreased awareness of need for assistance     Consult recommendations      Discharge recommendations  Mobility Equipment needs: To be determined later   Primary recommendation : Patient would benefit from SNF/Nursing Home/Other Skilled Facility.  Recommended transport method: Wheelchair  Secondary recommendation: Patient would benefit from intensive rehabilitation and is expected to be able to participate in at least 3 hrs of treatment daily  Justification: far from previous baseline, medically complex      PLAN     Treatment Plan Frequency    Plan: Continue per established POC.  Pt. demonstrates limitations which require skilled PT intervention.  1x (5 days/week).  Frequency will be increased as pt's condition or discharge plan indicates.     Brandy [name] will remain on the Physical Therapy schedule until goals are met, or there has been a change in the Plan of Care.          Treatment Diagnosis Surgery / # Days Post-op (if applicable)    Primary Dx: Resp failure  Treatment Dx: altered mobility  Procedure(s) (LRB):  AORTIC VALVE REPLACEMENT; INTRA AORTIC BALLOON INSERTION (N/A) / 13 Days Post-Op       SUBJECTIVE        Pain Level  Current status: Pain somewhat limits pt's ability to participate in therapy  Pain at start of  session: 2  Pain at end of session: 4  Pain location: Foot;Perineum  Pain descriptors: Sore     OBJECTIVE        Mental status  General Demeanor: Pleasant;Cooperative  LOC: Alert  Orientation: Oriented x 4  Directions: Follows one-step commands  --> Follows one-step commands: Consistently  Attention span: Attends, but requires cues to redirect  Memory: Decreased recall of precautions   Medical appliances  Medical Appliances: Oxygen;Rectal Tube/DigniCare;Telemetry;ECG;BP monitor;SPO2 monitor   Vitals    stable   Other         TREATMENT  Bed Mobility/Transfers  Bed mobility       Rolling / logroll         Transition to sitting up Supine to Sit: Mod Assist   To resting position      Transition to sitting down       Transition to supine       Scooting     Transfers      Transition to standing From bed: Min Assist;Verbal cues;Tactile cues;Visual cues;Tech required for balance/safety     Method Stand pivot: Mod Assist;Tech required for balance/safety   pt did few small steps  Gait/Stairs       Ther. Ex.  LE exercises: Ankle pumps;Heelslides;Leg press;Seated long arc quads;AAROM;PROM  Addit. Exercises: trunk ext/abd sets, deep breathing    Balance  Balance - Basic  Static  sitting balance: Assist level  Assist level: SBA  Position: Feet - supported  Time: 5 min   Training/Education  Trainee(s): Primary Learner  Primary Learner: Patient  Primary Learner - Barriers to Learning?: Yes  Primary Learner - Specific barriers: Physical  Co-learner: Spouse / Significant other  Training provided: sternal precautions  Response to training: receptive   Safety/Room Set-up   Call button accessible?: Yes  Phone accessible?: No  Oxygen reconnected?: No, not disconnected during therapy  Position on arrival: In bed  Position on departure (upright): In regular chair (hoyer under for ease/safety  back to bed)     GOALS     Patient/Caregiver goals reviewed and integrated with rehab treatment plan:     Multidisciplinary Problems (Active)         Problem: Mobility: Ambulation    Goal Priority Disciplines Outcome   Gait (with precautions)     PT    Description:  Pt. to ambulate 50 feet with one person min assist, using FWW, following any pertinent precautions.    Gait Soil scientist)     PT    Description:               Problem: Mobility: Transfers    Goal Priority Disciplines Outcome   Supine to/from sitting (with precautions)     PT    Description:  Pt. to transfer to/from sitting with one person min assist,  following any pertinent precautions.    Sitting to/from standing (with precautions)     PT    Description:  Pt. to transfer sitting to/from standing with one person min assist, following any pertinent precautions.    Bed to/from chair (with precautions)     PT    Description:  Pt. to transfer bed to/from chair with one person min assist, following any pertinent precautions.           Problem: Patient/Family Goal    Goal Priority Disciplines Outcome   Normal Status     PT    Description:  Pt. wants to return to previous level of function.     Mobility     PT    Description:  Patient wants to walk again.           Problem: Strength    Goal Priority Disciplines Outcome   Home Exercise Program (with precautions)     PT    Description:  Pt. to perform basic HEP independently following any pertinent precautions.                   G-Codes/PSFS/FIM Scores: (if applicable)          First session Second session (if applicable)    Start/Stop times: 1300 - 1335 Start/Stop times:   - Stop Time:     Total time: 35 minutes  Total time:   minutes     This note to serve as a Discharge Summary if Brandy [name] is discharged from the hospital or from therapy services.     Therapist:  Humphrey Rolls, PTA

## 2016-02-09 NOTE — Treatment Summary (Signed)
SPEECH LANGUAGE PATHOLOGY DAILY NOTE - INPATIENT    Patient name: Brandy [name]  Room #: 2007  Date: 02/09/2016    ASSESSMENT   Activity Tolerance: good activity tolerance. Pt tolerated all PO trials with no overt clinical s/s of aspiration. However, her PO intake was minimal as she disliked meal and also stated that the food was difficult to masticate (pt already on dysphagia minced diet). She requested fruit, and SLP ordered her fruit plate with cottage cheese that should be delivered to pt's room later this afternoon, 02/09/2016. Pt spoke in a whisper today and was nearly aphonic throughout therapy. SLP cued her to produce voicing, and pt stated that she could not do so. 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 Per chart, pt has not exhibited any recent temperature spikes. Per labs, pt's WBC was elevated today though it has been trending down compared to previous labs.    Solid Diet Level: Dysphagia Minced  Liquid Diet Level: thin/regular  Medication Administration: crushed, give with puree  Compensatory Swallowing Strategies: upright as possible for all oral intake, full supervision with meals, one to one assist with meals, small bites/sips, single sips only, monitor for fatigue, avoid thick/sticky foods, avoid dry, crumbly foods, moisten food with sauces, gravy, etc., oral care per policy, monitor temperature and lung sounds         PLAN   Skilled services of an SLP required to improve: swallowing function. Plan for next session: Reassess diet tolerance Frequency of Treatment: 5x/week. Discharge Recommendations: to be determined        SUBJECTIVE   Behavior/Cognition: alert;cooperative;pleasant mood Patient Positioning: upright in chair Respiratory Status: room air Precautions/Complicating Factors: fall risk, supplemental oxygen, generalized weakness, decreased endurance, aspiration risk        Family/Caregiver Present: yes - spouse/significant other;other  visitor(s)    Pain Limitations: pain does not limit patient's ability to participate in therapy                OBJECTIVE   Session Activities: dysphagia.        Itxel made the following progress toward goals today:  Goals Objective Data   Goal 1: Pt will tolerate least restrictive p.o. diet consistencies with swallow WFL for safe and adequate nutrition/hydration Pt tolerated a bite of pasta, a bite of chicken noodle soup, and approx 1 oz of protein shake via straw with no overt clinical s/s of aspiration.     Goal 2: Patient will perform swallowing exercises with >80% accuracy with min cues/models.  Not addressed             Speech Language Pathologist: Edrick Kins, CCC-SLP    Start/Stop Time: 1328 - 1359       Total Time: 31 minutes  SLP - Next Appointment: 02/10/16  [place] SLP Treatment Charges  $Swallow Treatment [phone]): 23-37 mins

## 2016-02-09 NOTE — Progress Notes (Signed)
Critical Care Progress Note:    Subjective: The patient is a 60 year non smoking female with BMI 40 and?progressive dyspnea since June.   ??  8/11 Admitted to [facility] ?with respiratory distress, pulmonary edema, NSTEMIC, anemia requiring 2 units PRBCs. ECHO showed severe aortic stenosis, EF 30%. Heart cath demonstrated no significant coronary occlusion. ?  ??  8/13 Transferred to [facility].   ??  8/17 Dr. Debroah Baller performed aortic valve replacement and right femoral IABP placement. Intraoperative EF was noted to be 15%. The patient was brought to the CCU post operatively. She required multiple vasopressors and inotropes and also received methylene blue. Nebulized flolan for pHTN.   ??  8/18 IABP was removed.   ??  8/19 Swan clotted and was removed. Levaquin started. Flolan stopped.   ??  8/20 Temp HD catheter was placed on and CRRT initiated. Bronchoscopy performed due to fever. Foley removed, new foley placed.   ??  8/21 Still?intubated. Milrinone 0.375 mcg/kg/min,?levophed at 10 mcg/min. CRRT. Due to ischemic appearing digits right radial aline removed, left radial aline placed. No purposeful response during sedation holiday. Enteral feeds started.   ??  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 [place] confirmed RLE DVT. Patient remained?intubated on levophed, milrinone with?CRRT running. She received IV amiodarone for afib. IV hydrocortisone started given persistence of circulatory shock (requiring 9 mcg/min levophed plus milrinone).  ??  8/23 Patient awakened with sedation holiday. Spontaneous breathing attempted, appeared weak for extubation. ?Milrinone has been weaned off, levophed is down to 3 mcg/min. ECHO: EF30-35%, AV prosthesis OK, moderate mitral regurgitation, evidence of RV pressure or volume overload. ?.   ??  8/24 Patient awakened, followed commands. Left IJ dialysis catheter stopped?functioning. LUE?PICC placed, right IJ TLC removed, ew right IJ temporary HD cath placed, left IJ temp HD cath removed.  ??  ??  8/25 Still appeared too weak for extubation.   ??  8/26 The patient passed some bloody stools.Extubation. ?Post extubation she required bipap alternating with heat high flow NC O2. Reported pain in her feet.   ??  8/27:?had good response to lasix. As urine output is improving, no plans for dialysis. Respiratory status appears improved. Failed SLP.  ??  8/28: Continues on amiodarone infusion with afib and vent rates of about 120bpm. Worked with PT this morning. 'Breathing fine'. Strong productive cough. Passed FEES study for pureed diet.  ?  8/29: she was weaned off supplemental oxygen.  Continued to make good urine with improving creatinine.    This morning she continues to be in afib with variable vent rate between 120-130bpm. Cardioversion (electrical) is planned.  Diuresis continues - Cr even better this morning.    Medications: Reviewed    Objective:    Physical Exam:  Vitals:    02/09/16 0900   BP: 111/60   Pulse: (!) 125   Resp: 18   Temp:    SpO2: 100%     Exam unchanged.  Gen: ?60 y.o.yr old ?female?resting comfortably in bed.  HEENT: Atraumatic, room air  Neck: Supple  Chest: sternotomy  Lungs: clear to ausculation  Cardiac: afib with variable ventricular rate.  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  ??  Plan:    Neurologic: PT/OT. Analgesia as required.   ??  Pulmonary: Pulmonary status has improved with diuresis. Room air.   ??  Cardiac: Plan  per CT surgery. Electrical cardioversion this morning.    ID: Levaquin day #12. WBC 15. Afebrile. D/C Levaquin after 2 more doses. If she develops fever and/or progressive leukocytosis, blood cultures should be obtained.    Renal: Continue foley for precise monitoring of urine output and to protect skin integrity in this morbidly obese, moribund critically ill patient with renal failure, circulatory overload. HD catheter to be removed. Cr improving.  ??  GI: Pureed diet per SLP - strict aspiration precautions. Given her iron deficiency anemia on admission, blood per rectum during hospitalization, she will need colonoscopy at some point when more clinically stable. She will need GI consult and urgent endoscopy if her H/H drops more precipitously.   ??  Endocrine: Insulin sliding scale as needed to maintain euglycemia.   ??  Heme: H/H 7.1????Platelets 426. ?Argatroban for HIT. Thrombocytopenia resolved. Famotidine switched to BID IV pantoprazole on 8/26 given GI bleeding.   ??  Code Status: full  ??  Critical care time spent: 30 mins  Brandy [name] Camillo Flaming

## 2016-02-09 NOTE — Treatment Summary (Signed)
IP OT TREATMENT NOTE    [name] / 2007/2007-01 DOB:  10-01-1955 / 60 y.o. Date:  02/09/2016     Precautions  Specific mobility precautions: Sternal precautions  Sternal Precautions: Standard     ASSESSMENT     Summary:  Pt sitting upright in chair upon arrival. Pt fatigued from sitting in chair and hoyer lift with 3 person assist utilized to transfer pt from chair back to bed. Pt required max A of 2 and cues for bed mobility of rolling to L and R. Pt demonstrated improvements with B UE strength, though continues to display <3/5 B UE strength with only partial ROM against gravity and slow movements with exercises. Pt also displaying bilateral UE/LE edematous and decreased FM coordination with UE's. Will continue with OT and assess for d/c recommendations.      Activity tolerance: Tolerates 30 min activity with multiple rests  Unable to Tolerate Activity: Excessive muscle fatigue  Comments: Pt has significant weakness and decreased activity tolerance.      Correction of Errors: Self-corrects with cues     Consult recommendations      Discharge recommendations  Mobility Equipment needs: To be determined later   Primary recommendation : Patient would benefit from SNF/Nursing Home/Other Skilled Facility.  Secondary recommendation: Patient would benefit from intensive rehabilitation and is expected to be able to participate in at least 3 hrs of treatment daily.  Justification: SNF vs [facility]        PLAN     Treatment Plan Frequency    Continue per established POC.  Pt. demonstrates limitations which require skilled OT intervention.  1x (5 days/week) Frequency will be increased as pt's condition or discharge plan indicates.     Brandy [name] will remain on the Occupational Therapy schedule until goals are met, or there has been a change in the Plan of Care.          Treatment Diagnosis Surgery / # Days Post-op (if applicable)    Primary Dx: Respiratory failure  Procedure(s) (LRB):  AORTIC VALVE  REPLACEMENT; INTRA AORTIC BALLOON INSERTION (N/A) / 13 Days Post-Op       SUBJECTIVE   Pt agreeable to OT treatment. Spouse and sister present during beginning of therapy session.      Pain Level  Current status: Pain somewhat limits pt's ability to participate in therapy  Pain location: Foot (Bilateral feet)  Wong-Baker FACES: 4 - Hurts little more     OBJECTIVE   ADL's of functional transfers and bed mobility. Self-care's of washing face in supine with assistance. AROM with B UE's.      Mental status  Mental Status  General Demeanor: Pleasant;Cooperative  LOC: Alert  Directions: Follows one-step commands  --> Follows one-step commands: Consistently  Attention span: Appears intact  Memory: Appears intact in social/therapy situations  Cognition  Initiation: Appears intact   TREATMENT  Bed Mobility/Transfers  Bed mobility       Rolling / logroll  Roll L: Max Assist;Verbal cues;Tactile cues (Assist of 2)  Roll R: Max Assist;Verbal cues;Tactile cues (Assist of 2)      Transition to sitting up     To resting position      Transition to sitting down       Transition to supine       Scooting     Transfers      Transition to standing       Method     Pt transfered back  to bed from chair with hoyer lift.   ADLs  Where assessed: Supine  Grooming: Mod Assist;Increased time to complete (Washing face in supine. )   Strength  Upper extremities: < 3/5 bilaterally  Narrative: B UE strength increased since Monday, though continues to be significantly decreased and below baseline.    Training/Education  Trainee(s): Producer, television/film/video Learner: Patient  Primary Learner - Barriers to Learning?: Yes  Primary Learner - Specific barriers: Physical  Training provided: Functional transfers, B UE exercises, ADL's  Response to training: Receptive   Safety/Room set-up   Call button accessible?: Yes  Position on arrival: In regular chair  Position on departure (bed): Bed alarm on;Bed in lowest position     GOALS     Patient/Caregiver  goals reviewed and integrated with rehab treatment plan:     Multidisciplinary Problems (Active)        Problem: ADLs    Goal Priority Disciplines Outcome   ADL Activity Tolerance for Self Care     OT    Description:  Pt  to tolerate 15  minutes of self care activity in seated position    Caregiver     OT    Description:  Caregiver will demonstrate the ability to assist with patient's care, as needed.           Problem: Edema    Goal Priority Disciplines Outcome   Stiffness/Joint mobility     OT    Description:  Pt's edema will be managed to prevent long term stiffness and loss of joint mobility with ROM, elevation, etc.             Problem: IP Post Cardiac Goal List    Goal Priority Disciplines Outcome   Self-Care UE     OT    Description:  Patient to perform  upper self-care, following sternal precautions with min assistance.    Self-Care LE     OT    Description:  Patient to perform lower body self-care with ,min assistance, except TED hose.      Toilet Transfers     OT    Description:  Patient to be able to complete modified toilet transfers with min assistance.     Bath/Shower Transfers     OT    Description:  Patient to perform  modified shower transfers with  min assist.    Precautions     OT    Description:  Patient to follow sternal  precautions, while performing ADLs.           Problem: ROM    Goal Priority Disciplines Outcome   AROM     OT    Description:  Patient to increase movement in B UE joint(s) to [facility].             Problem: Safety    Goal Priority Disciplines Outcome   Safety DME     OT    Description:  Pt/Caregiver to demonstrate awareness of DME needs for safety upon discharge.           Problem: Strength    Goal Priority Disciplines Outcome   Functional Strength     OT    Description:  Patient will use both UEs bilaterally, in self- care/ADLs.      Fine Motor Tasks     OT    Description:  Pt. to be able to use upper extremities for fine motor tasks.  G-Codes/PSFS/FIM Scores: (if  applicable)          First session Second session (if applicable)    Start/Stop times: 1510 - 1555 Start/Stop times:   - Stop Time:     Total time: 45 minutes  Total time:   minutes     This note to serve as a Discharge Summary if Brandy [name] is discharged from the hospital or from therapy services.     Therapist:  Werner Lean, OT

## 2016-02-09 NOTE — Plan of Care (Signed)
Coccyx is red and folds in abdomen are excoriated. Turn q 2 hours. Nystatin powder applied to folds.

## 2016-02-09 NOTE — Procedures (Signed)
CARDIOVERSION  REFERRING PHYSICIAN: Otho Bellows, MD    INDICATION: Severe symptomatic atrial fibrillation with rapid ventricular response    PROCEDURE DETAILS: The patient was counseled as to the risk and benefits of the procedure and wished to proceed. She was connected to monitor equipment in her ICU room. Defibrillator pads were placed in the anterior-posterior fashion. She was given 75 mg total of propofol intervenously until adequate sedation was achieved. After a second shock using 200 J (first attempt unsuccessful at 120 J), the patient was successfully cardioverted into sinus rhythm at 99 beats per minute with blood pressure of 87/40. She tolerated the procedure with no apparent complications.    CONCLUSIONS  1. Successful cardioversion.  2. Continue amiodarone oral loading    Sharma Covert, MD

## 2016-02-09 NOTE — Procedures (Signed)
01/23/2016    CARDIOVERSION    REFERRING PHYSICIAN: Otho Bellows, MD    INDICATION: Severe symptomatic atrial fibrillation with rapid ventricular   response    PROCEDURE DETAILS: The patient was counseled as to the risk and benefits of the   procedure and wished to proceed. She was connected to monitor equipment in her   ICU room. Defibrillator pads were placed in the anterior-posterior fashion. She   was given 75?mg total of propofol intervenously until adequate sedation was   achieved. After a second shock using 200 J (first attempt unsuccessful at 120   J), the patient was successfully cardioverted into sinus rhythm at 99?beats per   minute with blood pressure of 87/40. She tolerated the procedure with no   apparent complications.    CONCLUSIONS  1. Successful cardioversion.  2. Continue amiodarone oral loading.        Sharma Covert, MD    JL/lw  D:  02/09/2016  11:42 A   T:  02/09/2016  11:55 A   TID:  [id number]  RECEIPT:    [id number]

## 2016-02-09 NOTE — Progress Notes (Addendum)
Daily Progress Note     Name: Brandy [name]  DOB: September 22, 1955 60 y.o.  MRN: [id number]  CSN: [id number]    Assessment & Plan:     Active Hospital Problems    Diagnosis SNOMED CT(R) Date Noted   . Heparin induced thrombocytopenia (CMS/HCC) HEPARIN-INDUCED THROMBOCYTOPENIA 02/02/2016   . S/P AVR (aortic valve replacement) HISTORY OF AORTIC VALVE REPLACEMENT 01/27/2016   . Respiratory failure (CMS/HCC) RESPIRATORY FAILURE 01/23/2016   . Aortic stenosis, severe AORTIC VALVE STENOSIS 01/22/2016   . NSTEMI (non-ST elevated myocardial infarction) (CMS/HCC) ACUTE NON-ST SEGMENT ELEVATION MYOCARDIAL INFARCTION 01/21/2016   . Demand ischemia of myocardium (CMS/HCC) ACUTE ISCHEMIC HEART DISEASE 01/21/2016   . Iron deficiency anemia IRON DEFICIENCY ANEMIA 01/21/2016   . Obesity OBESITY 01/21/2016     --AKI: ATN secondary to cardiogenic shock and possible sepsis from nosocomial pneumonia.  CRRT discontinued 8/25.     Continues with signs of improving kidney function     Hypokalemia noted    --Pneumonia: On Levaquin, now afebrile.      Persistent leukocytosis continues to improve  --S/P AVR:  Complicated by cardiogenic shock.     --A-fib with RVR: Despite amiodarone.   --Heparin Induced Thrombcytopenia. Resolved  --DVT:  On agatroban  --Microcytic iron deficient anemia    Started intravenous iron yesterday    Plan:   --Remove dialysis catheter  --Continue to observe for renal recovery.   --Medications reviewed  --Intravenous Ferrlecit 125 mg daily for microcytic anemia x 8 doses  --Potassium replacement given  --We'll follow peripherally    Subjective:     Brandy [name] is a 60 y.o. female patient. Patient was admitted with critical AS s/p AVR complicated by cardiogenic shock/AKI.  CRRT was required and then discontinued 8/25 due to high return pressures. Patient has now not been dialyzed for the last several days with signs of improving urinary output.   Remains on amiodarone for A-fib.  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     Patient continues in CCU but overall improving trend continues. She is feeling reasonable well.  She is working with her dysphagia limits and has a suction catheter in her hand frequently.  2 L urinary output yesterday.  Renal function continues to gradually improve; creatinine 2.9-2.4. Mild hypokalemia noted.        Scheduled Medications:   . albuterol  2.5 mg Nebulization 4x daily   . atorvastatin  20 mg Oral Nightly   . carvedilol  3.125 mg Oral BID with meals   . ferric gluconate (FERRLECIT) IVPB  125 mg Intravenous Daily   . insulin aspart   Subcutaneous 6 times per day   . [START ON 02/10/2016] levofloxacin (LEVAQUIN) IV  750 mg Intravenous Q48H   . pantoprazole  40 mg Intravenous 2 times per day   . phenylephrine       . phenylephrine       . potassium chloride  40 mEq Intravenous Once   . sodium chloride 0.9 % (NS) syringe  10 mL Intravenous Q8H SCH   . sodium chloride 0.9 % (NS) syringe  5-10 mL Intravenous Daily     Infusions:   . amiodarone in D5W Stopped (02/08/16 1508)   . amiodarone in D5W Stopped (02/06/16 1547)   . amiodarone in D5W 0.5 mg/min (02/09/16 0812)   . argatroban 0.8 mcg/kg/min (02/09/16 0712)   . clevidipine Stopped (02/02/16 0800)   . dexmedetomidine Stopped (02/05/16 1402)   . dextrose 10% (D10W)     .  dextrose 5% lactated ringers Stopped (01/29/16 1829)   . DOBUTamine (DOBUTREX) 250 mg in D5W 250 mL (1000 mcg/mL) infusion     . midazolam Stopped (01/31/16 0900)   . milrinone Stopped (02/02/16 0736)   . nitroglycerin     . norepinephrine Stopped (02/05/16 1203)   . phenylephrine (NEO-SYNEPHRINE) 40 mg in NaCl 0.9 % 500 mL (80 mcg/mL) infusion **DOUBLE CONCENTRATION** Stopped (01/31/16 0515)   . propofol Stopped (02/05/16 0910)   . vasopressin (PITRESSIN) 50 units in NaCl 0.9 % 250 mL (0.2 units/mL) infusion **ADULT SHOCK** Stopped (01/28/16 1711)     PRN Medications: acetaminophen (TYLENOL) suppository, acetaminophen, amiodarone in D5W, amiodarone in D5W, anticoagulant  citrate dextrose solution A, calcium gluconate IVPB, clevidipine, dextrose 5% lactated ringers, dextrose, DOBUTamine (DOBUTREX) 250 mg in D5W 250 mL (1000 mcg/mL) infusion, EPINEPHrine, hydrALAZINE, HYDROcodone-acetaminophen, magnesium sulfate IVPB, magnesium sulfate IVPB, midazolam, milrinone, morphine, nitroglycerin, norepinephrine, oxyCODONE, phenylephrine (NEO-SYNEPHRINE) 40 mg in NaCl 0.9 % 500 mL (80 mcg/mL) infusion **DOUBLE CONCENTRATION**, propofol, sodium chloride, sodium chloride 0.9 % (NS) syringe, sodium chloride 0.9 % (NS) syringe    Objective:     Vital Signs:    Vital Sign Ranges for Last 24 Hours:  BP  Min: 96/50  Max: 137/91  Temp  Min: 36.4 ?C (97.5 ?F)  Max: 36.8 ?C (98.2 ?F)  Pulse  Min: 111  Max: 141  Resp  Min: 15  Max: 27  SpO2  Min: 91 %  Max: 100 %    Most Recent Vitals  BP: 103/61  Pulse: 111  Resp: 15  Temp: 36.5 ?C (97.7 ?F)  SpO2: 100 % (08/29 2000-08/30 0806)    Intake/Ouput:    Intake/Output Summary (Last 24 hours) at 02/09/16 0838  Last data filed at 02/09/16 0648   Gross per 24 hour   Intake           1044.3 ml   Output             1865 ml   Net           -820.7 ml        Exam:  Alert and oriented and interactive  RIJ CVC  Coarse UA BS  Atrial fibrillation rate 115  Abd: Obese, nontender  Ext: 1-2 + edema      Recent Labs:  Results for orders placed or performed during the hospital encounter of 01/23/16 (from the past 24 hour(s))   POCT Glucose -Next Routine    Collection Time: 02/08/16  9:13 AM   Result Value Ref Range    POC Glucose 97 80 - 99 mg/dL   POCT Glucose -Next Routine    Collection Time: 02/08/16  2:11 PM   Result Value Ref Range    POC Glucose 135 (H) 80 - 99 mg/dL   POCT Glucose -Next Routine    Collection Time: 02/08/16  4:11 PM   Result Value Ref Range    POC Glucose 107 (H) 80 - 99 mg/dL   POCT Glucose -Next Routine    Collection Time: 02/08/16  8:05 PM   Result Value Ref Range    POC Glucose 88 80 - 99 mg/dL   Magnesium -Conditional    Collection Time: 02/08/16   8:24 PM   Result Value Ref Range    Magnesium 1.8 1.6 - 2.4 mg/dL   Potassium -ASAP    Collection Time: 02/08/16  8:24 PM   Result Value Ref Range    Potassium 3.4 (L) 3.5 - 5.1  mmol/L   POCT Glucose -Next Routine    Collection Time: 02/08/16 11:12 PM   Result Value Ref Range    POC Glucose 105 (H) 80 - 99 mg/dL   CBC with Auto Differential -Daily    Collection Time: 02/09/16  3:55 AM   Result Value Ref Range    WBC 15.5 (H) 4.8 - 10.8 10*3/?L    RBC 3.17 (L) 4.20 - 5.40 10*6/?L    Hemoglobin 7.1 (L) 12.0 - 16.0 g/dL    HCT 16.1 (L) 09.6 - 48.0 %    MCV 72.2 (L) 81.0 - 99.0 fL    MCH 22.5 (L) 27.0 - 34.0 pg    MCHC 31.1 (L) 32.0 - 36.0 g/dL    RDW 04.5 (H) 40.9 - 14.5 %    Platelet Count 426 (H) 150 - 400 10*3/?L    MPV 7.8 7.4 - 10.4 fL    Neutrophils % 85 (H) 35 - 70 %    Bands % 1 0 - 10 %    Lymphocytes % 8 (L) 25 - 45 %    Monocytes % 4 0 - 12 %    Eosinophils % 1 0 - 7 %    Basophils % 0 0 - 2 %    Metamyelocytes % 1 (H) 0 %    Nucleated Red Blood Cells % 1.0 (H) <1.0 %    Neutrophils, Absolute 13.2 (H) 1.6 - 7.3 10*3/?L    Bands, Absolute 0.2 0.0 - 1.2 10*3/?L    Lymphocytes, Absolute 1.2 1.1 - 4.3 10*3/?L    Monocytes, Absolute 0.6 0.0 - 1.2 10*3/?L    Eosinophils, Absolute 0.2 0.0 - 0.7 10*3/?L    Basophils, Absolute 0.0 0.0 - 0.2 10*3/?L    Metamyelocytes, Absolute 0.2 (H) 0.0 10*3/?L    Differential Type Manual Differential     Platelet Estimate Decreased (A) Normal    Anisocytosis 3+ (A) (none)    Hypochromasia 1+ (A) (none)    Microcytes 2+ (A) (none)    Ovalocytes 1+ (A) (none)    Polychromasia 1+ (A) (none)   APTT -Daily    Collection Time: 02/09/16  3:56 AM   Result Value Ref Range    APTT 58 (H) 23 - 36 Seconds   Renal Function Panel -Daily    Collection Time: 02/09/16  3:56 AM   Result Value Ref Range    Albumin 2.5 (L) 3.5 - 5.0 g/dL    Calcium 9.3 8.6 - 81.1 mg/dL    Phosphorus 3.5 2.5 - 4.5 mg/dL    BUN 73 (H) 6 - 23 mg/dL    Creatinine 9.14 (H) 0.55 - 1.10 mg/dL    Sodium 782 956 - 213 mmol/L       Glucose 102 (H) 80 - 99 mg/dL    Potassium 3.3 (L) 3.5 - 5.1 mmol/L    Chloride 105 98 - 111 mmol/L    CO2 - Carbon Dioxide 22.6 21.0 - 31.0 mmol/L    Anion Gap 12.4 (H) 3.0 - 11.0 mmol/L    GFR Estimate 21 (L) >=60 mL/min/1.64m*2    GFR Additional Info     Magnesium -ASAP    Collection Time: 02/09/16  3:56 AM   Result Value Ref Range    Magnesium 1.8 1.6 - 2.4 mg/dL     Pending Labs     Order Current Status    Blood Gas-Arterial -See Comments for Frequency In process    Extra Tubes (ASA) -Next Routine In process  Red Top -Once In process    Blood Culture -x 2 Preliminary result    Blood Culture -x 2 Preliminary result    Respiratory culture -Next Routine Preliminary result        Recent Imaging:  No results found.     LOS: 17 days     Darrel Hoover  02/09/2016  8:38 AM

## 2016-02-09 NOTE — Progress Notes (Addendum)
CARDIOTHORACIC SURGERY CRITICAL CARE NOTE     Name: Brandy [name]  DOB: 10-15-1955 60 y.o.  MRN: [id number]  CSN: [id number]    Brandy [name] is a 60 y.o. female patient. Patient was admitted with Respiratory failure (CMS/HCC)  Who had Procedure(s):  AORTIC VALVE REPLACEMENT; INTRA AORTIC BALLOON INSERTION.  13 Days Post-Op    HPI: The patient is a 60 y.o. female with the below medical co-morbid conditions.    ?  1. Severe symptomatic aortic stenosis                        Ejection fraction 30%     Found on TEE intra-op to be 15%                        Normal coronaries                        Predominant symptom dyspnea                     2. Severe Pulmonary Hypertension  3. Mild to Moderate Mitral regurgitation  4. Morbid obesity                        Weight loss 50 pounds in the last one year  5. respiratory insufficiency  6. Non-ST elevation MI                        The demand ischemia  7. Iron deficiency anemia                        CT chest abdomen and pelvis reviewed no abdominal masses                        Likely AVM secondary to aortic stenosis not a good candidate for sedation and EGD or colonoscopy well aortic stenosis is so critical  ?  Patient's a 60 year old female who presented the [facility] falls in respiratory distress unable to breathe.  There are her troponin was elevated she underwent oxygen therapy and was given Lasix.  Cardiac workup showed normal coronary arteries with depressed LV function on echo and severe aortic stenosis valve area less than 0.4 velocity 4.4 m/s with an EF 30%.  She was transfused 2 units of PRBCs A [facility] hospital with a hemoglobin 8.8.  His immobility volume overload from heart failure  ?  Patient hasn't been to the doctor in years she hasn't taken any medications nor does she take good care of herself.  [place] she is unable to consistently take meds.  She likes to use and asked to [employer] wood to heat her house.    ?  After a few  days of Lasix patient states that she was breathing much better she is 60% better per her suggestion.  I had a long talk with the family about the disease course why she was getting short of breath and discussion of heart failure.  They understand that she is given a need aortic valve replacement she wishes to have tissue valve I do think she is a better surgical candidate and have her given her age and risk.  ?  She has very poor dentition    POD #1  Patient has  been stable on balloon 1:2   Methylene blue last night SVR much better   Coronary perfusion good   Today I am managing acute heart failure with management of the IABP.    POD #2  removal of the intra-aortic balloon pump renal ultrasound completed showing minimal flow to the kidneys  patient still making urine  POD #3 CRRT started  POD #4 CRRT running acid base more stable pressures much better off NEO and LEVOPHED improved making urine  POD#5 CRRT continues   Patient plt 25   Will check for DVT   Postoperative day #6 CRRT continues platelets now up to 32 treating for heparin-induced from cytopenia  Postoperative day #7 Trialysis Cath replaced secondary to malfunction.  On agatroban still hemodynamics improving oxygen requirements lower.  Postoperative day #8 making transition to hemodialysis   Postoperative day #9 +12 liters still  Will need HD likely     POD #10:  11.8 L positive. Planning hemodialysis today.  remains in A. Fib on the amiodarone protocol.  POD#11: 8 liters positive.  No other changes  POD#12: still in AFIB rates 110-130   Multiple amiodarone loads without success...digoxin not good option secondary to renal failure  POD#13 doing well     Subjective: making progress    Assessment & Plan:     Active Hospital Problems    Diagnosis SNOMED CT(R) Date Noted   . Heparin induced thrombocytopenia (CMS/HCC) HEPARIN-INDUCED THROMBOCYTOPENIA 02/02/2016   . S/P AVR (aortic valve replacement) HISTORY OF AORTIC VALVE REPLACEMENT 01/27/2016   .  Respiratory failure (CMS/HCC) RESPIRATORY FAILURE 01/23/2016   . Aortic stenosis, severe AORTIC VALVE STENOSIS 01/22/2016   . NSTEMI (non-ST elevated myocardial infarction) (CMS/HCC) ACUTE NON-ST SEGMENT ELEVATION MYOCARDIAL INFARCTION 01/21/2016   . Demand ischemia of myocardium (CMS/HCC) ACUTE ISCHEMIC HEART DISEASE 01/21/2016   . Iron deficiency anemia IRON DEFICIENCY ANEMIA 01/21/2016   . Obesity OBESITY 01/21/2016     Neurologic: No issues sedated     Cardiac: Pre-op EF:   EF 15% prior to surgery   Post op 30% with good valve function 23mm valve     1. Inotropes off   2. Pressors: off         3. Rhythm: AFIB    4. Aspirin: holding    5. Beta-Blocker: carvedilol    6. Statin: atorvastatin    7. ACE/ARB: hold        Respiratory:    Smoker none   Pulmonary toilet    Xray: no issues      Gastrointestinal: no BM    Genitourinary: creat: pre op 0.9   Now 1.16 >2.6 up to 3.4>3.26>1.85>1.37>1.25>2.16>2.76>3.15>2.4  OFF CRRT 8/25 in AM    Infectious Disease:  WBC: 17>18> 19.4>16.8>13.5>17.7>17.3>21.5>18>15    Hematology: HGB 9.8>8.6>7.7>7.5>8.6 >8.2>7.1>7.1  PLTS 180> 91>64>32>25>32>31>32>98>172>404        Checked hitt  ++++   Stopped all heparin and started agatroban      Endocrine: Glucose management   Electrolytes: replacing Mg/K/Ca  Prophylaxis Agatroban PPI/H2 Blocker     Scheduled Medications:   . albuterol  2.5 mg Nebulization 4x daily   . atorvastatin  20 mg Oral Nightly   . carvedilol  3.125 mg Oral BID with meals   . ferric gluconate (FERRLECIT) IVPB  125 mg Intravenous Daily   . insulin aspart   Subcutaneous 6 times per day   . [START ON 02/10/2016] levofloxacin (LEVAQUIN) IV  750 mg Intravenous Q48H   . pantoprazole  40 mg Intravenous  2 times per day   . phenylephrine       . phenylephrine       . potassium chloride  40 mEq Intravenous Once   . sodium chloride 0.9 % (NS) syringe  10 mL Intravenous Q8H SCH   . sodium chloride 0.9 % (NS) syringe  5-10 mL Intravenous Daily     Infusions:   . amiodarone in D5W  Stopped (02/08/16 1508)   . amiodarone in D5W Stopped (02/06/16 1547)   . amiodarone in D5W 0.5 mg/min (02/09/16 0230)   . argatroban 0.8 mcg/kg/min (02/09/16 0312)   . clevidipine Stopped (02/02/16 0800)   . dexmedetomidine Stopped (02/05/16 1402)   . dextrose 10% (D10W)     . dextrose 5% lactated ringers Stopped (01/29/16 1829)   . DOBUTamine (DOBUTREX) 250 mg in D5W 250 mL (1000 mcg/mL) infusion     . midazolam Stopped (01/31/16 0900)   . milrinone Stopped (02/02/16 0736)   . nitroglycerin     . norepinephrine Stopped (02/05/16 1203)   . phenylephrine (NEO-SYNEPHRINE) 40 mg in NaCl 0.9 % 500 mL (80 mcg/mL) infusion **DOUBLE CONCENTRATION** Stopped (01/31/16 0515)   . propofol Stopped (02/05/16 0910)   . vasopressin (PITRESSIN) 50 units in NaCl 0.9 % 250 mL (0.2 units/mL) infusion **ADULT SHOCK** Stopped (01/28/16 1711)     Recent Labs:  Lab Results   Component Value Date    NA 140 02/09/2016    K 3.3 (L) 02/09/2016    K 3.69 01/23/2016    CL 105 02/09/2016    CL 104.1 01/23/2016    CO2 22.6 02/09/2016    CO2 20 (L) 01/23/2016    GLU 102 (H) 02/09/2016    GLU 85 01/23/2016    BUN 73 (H) 02/09/2016    CREATININES 2.40 (H) 02/09/2016    CALCIUM 9.3 02/09/2016    AST 26 02/04/2016    ALT 10 02/04/2016    ALKPHOS 121 (H) 02/04/2016    BILITOT 0.90 01/21/2016    ALBUMIN 2.5 (L) 02/09/2016    GLOB 3.0 02/04/2016    AGRATIO 0.9 (L) 02/04/2016    ANIONGAP 12.4 (H) 02/09/2016    ANIONGAP 21.0 (H) 01/23/2016    LABGLOM 21 (L) 02/09/2016    LABGLOM >60.0 01/23/2016    WHITEBLOODCE 15.5 (H) 02/09/2016    LABPLAT 426 (H) 02/09/2016    HGB 7.1 (L) 02/09/2016    PROTIME 22.3 (H) 02/03/2016    INR 2.0 (H) 02/03/2016    APTT 58 (H) 02/09/2016          Vital Signs:      ? Cardiac output   ? Cardiac Index   Temp: 36.5 ?C (97.7 ?F) BP: 103/61 Pulse: (!) 111 Resp: 15 SpO2: 94 % on O2 Flow Rate (L/min): 25 L/min None (Room air)   Intake/Output:     CT OUTPUT: removed     URINE OUTPUT: 2000  +7 literss    Exam:  Neuro: extubated and  normal neuro exam  Cardiac: Atrial fibrillation irregular no murmurs  Lungs;clear  ABDOMEN:abdomen is soft without significant tenderness, masses, organomegaly or guarding  EXTREMITIES 2+ edema in the lower extremities toes appear much better digital ischemia much improved    PLAN:   Dr. Truddie Hidden to see today he is excellent and will decide on cardioversion if possible          Otho Bellows

## 2016-02-10 ENCOUNTER — Inpatient Hospital Stay: Admit: 2016-02-10 | Payer: MEDICAID

## 2016-02-10 LAB — POCT GLUCOSE
POC Glucose: 96 mg/dL (ref 80–99)
POC Glucose: 96 mg/dL (ref 80–99)
POC Glucose: 83 mg/dL (ref 80–99)
POC Glucose: 102 mg/dL — ABNORMAL HIGH (ref 80–99)
POC Glucose: 162 mg/dL — ABNORMAL HIGH (ref 80–99)
POC Glucose: 91 mg/dL (ref 80–99)

## 2016-02-10 LAB — CBC WITH AUTO DIFFERENTIAL
Basophils %: 1 % (ref 0–2)
Basophils, Absolute: 0.2 10*3/ÂµL (ref 0.0–0.2)
Eosinophils %: 1 % (ref 0–7)
Eosinophils, Absolute: 0.2 10*3/ÂµL (ref 0.0–0.7)
HCT: 23.3 % — ABNORMAL LOW (ref 37.0–48.0)
Hemoglobin: 7 g/dL — ABNORMAL LOW (ref 12.0–16.0)
Lymphocytes %: 4 % — ABNORMAL LOW (ref 25–45)
Lymphocytes, Absolute: 0.6 10*3/ÂµL — ABNORMAL LOW (ref 1.1–4.3)
MCH: 22.1 pg — ABNORMAL LOW (ref 27.0–34.0)
MCHC: 30.1 g/dL — ABNORMAL LOW (ref 32.0–36.0)
MCV: 73.6 fL — ABNORMAL LOW (ref 81.0–99.0)
MPV: 7.8 fL (ref 7.4–10.4)
Metamyelocytes %: 1 % — ABNORMAL HIGH
Metamyelocytes, Absolute: 0.2 10*3/ÂµL — ABNORMAL HIGH
Monocytes %: 2 % (ref 0–12)
Monocytes, Absolute: 0.3 10*3/ÂµL (ref 0.0–1.2)
Myelocytes %: 1 % — ABNORMAL HIGH
Myelocytes, Absolute: 0.2 10*3/ÂµL — ABNORMAL HIGH
Neutrophils %: 90 % — ABNORMAL HIGH (ref 35–70)
Neutrophils, Absolute: 14.3 10*3/ÂµL — ABNORMAL HIGH (ref 1.6–7.3)
Platelet Count: 466 10*3/ÂµL — ABNORMAL HIGH (ref 150–400)
Platelet Estimate: INCREASED — AB
RBC: 3.17 10*6/ÂµL — ABNORMAL LOW (ref 4.20–5.40)
RDW: 27 % — ABNORMAL HIGH (ref 11.5–14.5)
WBC: 15.9 10*3/ÂµL — ABNORMAL HIGH (ref 4.8–10.8)

## 2016-02-10 LAB — RENAL FUNCTION PANEL
Albumin: 2.5 g/dL — ABNORMAL LOW (ref 3.5–5.0)
Anion Gap: 10.1 mmol/L (ref 3.0–11.0)
BUN: 62 mg/dL — ABNORMAL HIGH (ref 6–23)
CO2 - Carbon Dioxide: 23.9 mmol/L (ref 21.0–31.0)
Calcium: 9.7 mg/dL (ref 8.6–10.3)
Chloride: 107 mmol/L (ref 98–111)
Creatinine: 1.96 mg/dL — ABNORMAL HIGH (ref 0.55–1.10)
GFR Estimate: 26 mL/min/{1.73_m2} — ABNORMAL LOW (ref >=60)
Glucose: 96 mg/dL (ref 80–99)
Phosphorus: 2.4 mg/dL — ABNORMAL LOW (ref 2.5–4.5)
Potassium: 3.5 mmol/L (ref 3.5–5.1)
Sodium: 141 mmol/L (ref 135–143)

## 2016-02-10 LAB — MAGNESIUM
Magnesium: 2 mg/dL (ref 1.6–2.4)
Magnesium: 1.8 mg/dL (ref 1.6–2.4)

## 2016-02-10 LAB — PROTIME-INR
INR: 2.9 — ABNORMAL HIGH (ref 0.9–1.2)
Protime: 32.2 Seconds — ABNORMAL HIGH (ref 10.0–13.0)

## 2016-02-10 LAB — APTT: APTT: 61 Seconds — ABNORMAL HIGH (ref 23–36)

## 2016-02-10 LAB — POTASSIUM: Potassium: 3.9 mmol/L (ref 3.5–5.1)

## 2016-02-10 MED ORDER — potassium chloride SA (K-DUR,KLOR-CON) CR tablet 40 mEq
10 | Freq: Once | ORAL | Status: AC
Start: 2016-02-10 — End: 2016-02-10
  Administered 2016-02-10: 21:00:00 10 meq via ORAL

## 2016-02-10 MED ORDER — carvedilol (COREG) tablet 6.25 mg
6.25 | Freq: Two times a day (BID) | ORAL | Status: DC
Start: 2016-02-10 — End: 2016-02-17
  Administered 2016-02-10 – 2016-02-17 (×15): 6.25 mg via ORAL

## 2016-02-10 MED ORDER — warfarin (COUMADIN) tablet 3 mg
3 | Freq: Every day | ORAL | Status: DC
Start: 2016-02-10 — End: 2016-02-12
  Administered 2016-02-11 – 2016-02-12 (×2): 3 mg via ORAL

## 2016-02-10 MED FILL — ARGATROBAN 50 MG/50 ML (1 MG/ML) IN SODIUM CHLORIDE (ISO-OSMOTIC) IV: 50 mg/50 mL (1 mg/mL) | INTRAVENOUS | Qty: 50

## 2016-02-10 MED FILL — COUMADIN 3 MG TABLET: 3 mg | ORAL | Qty: 1

## 2016-02-10 MED FILL — LEVOFLOXACIN IN D5W 750 MG/150 ML IVPB PREMIX: 750 mg/150 mL | INTRAVENOUS | Qty: 150

## 2016-02-10 MED FILL — NEXTERONE 360 MG/200 ML (1.8 MG/ML) INTRAVENOUS SOLUTION: 360 mg/200 mL (1.8 mg/mL) | INTRAVENOUS | Qty: 360

## 2016-02-10 MED FILL — PROTONIX 40 MG INTRAVENOUS SOLUTION: 40 mg | INTRAVENOUS | Qty: 40

## 2016-02-10 MED FILL — CARVEDILOL 3.125 MG TABLET: 3.125 mg | ORAL | Qty: 2

## 2016-02-10 MED FILL — CARVEDILOL 3.125 MG TABLET: 3.125 mg | ORAL | Qty: 1

## 2016-02-10 MED FILL — ATORVASTATIN 20 MG TABLET: 20 mg | ORAL | Qty: 1

## 2016-02-10 MED FILL — KLOR-CON M10 MEQ TABLET,EXTENDED RELEASE: 10 meq | ORAL | Qty: 4

## 2016-02-10 MED FILL — FERRLECIT 62.5 MG/5 ML INTRAVENOUS SOLUTION: 62.5 | INTRAVENOUS | Qty: 10

## 2016-02-10 NOTE — Progress Notes (Signed)
PHARMACIST CRITICAL CARE PROGRESS NOTE     Consult Per P&T Protocol: Critical Care Physician Service, Antimicrobial Interventions, Renal Dosing, IV to PO         Summary: Brandy [name] is [name] admitted on 01/23/2016 with a NSTEMI s/p R&L heart catheterization. PICC line placed 8/25. CRRT from 8/20-8/25. 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 GI bleed on 8/26, currently on PPI BID.    Allergies: Heparin and Sulfa (sulfonamide antibiotics)    PMH:  has a past medical history of History of blood transfusion.     Surgical history:  has a past surgical history that includes Tonsillectomy; Tubal ligation; Tubal ligation (1980); and ocedure (N/A, 01/27/2016).    Active problems:   Patient Active Problem List    Diagnosis SNOMED CT(R) Date Noted   . Heparin induced thrombocytopenia (CMS/HCC) HEPARIN-INDUCED THROMBOCYTOPENIA 02/02/2016   . S/P AVR (aortic valve replacement) HISTORY OF AORTIC VALVE REPLACEMENT 01/27/2016   . Non-ischemic cardiomyopathy (CMS/HCC) CARDIOMYOPATHY 01/23/2016   . Respiratory failure (CMS/HCC) RESPIRATORY FAILURE 01/23/2016   . Aortic stenosis, severe AORTIC VALVE STENOSIS 01/22/2016   . Acute systolic heart failure (CMS/HCC) ACUTE SYSTOLIC HEART FAILURE 01/22/2016   . Heart failure (CMS/HCC) HEART FAILURE 01/21/2016   . Elevated brain natriuretic peptide (BNP) level HORMONE LEVEL - FINDING 01/21/2016   . NSTEMI (non-ST elevated myocardial infarction) (CMS/HCC) ACUTE NON-ST SEGMENT ELEVATION MYOCARDIAL INFARCTION 01/21/2016   . Demand ischemia of myocardium (CMS/HCC) ACUTE ISCHEMIC HEART DISEASE 01/21/2016   . Tachycardia TACHYCARDIA 01/21/2016   . Pleural effusion PLEURAL EFFUSION 01/21/2016   . Obesity OBESITY 01/21/2016   . Acute respiratory failure with hypoxia (CMS/HCC) ACUTE RESPIRATORY FAILURE 01/21/2016   . Hypotension LOW BLOOD PRESSURE 01/21/2016   . Iron deficiency anemia IRON DEFICIENCY ANEMIA 01/21/2016   . Thrombocytosis (CMS/HCC) THROMBOCYTOSIS  01/21/2016   . Weight loss WEIGHT DECREASED 01/21/2016   . Family history of cancer FAMILY HISTORY OF CANCER 01/21/2016   . Cardiomegaly CARDIOMEGALY 01/21/2016   . Mediastinal lymphadenopathy MEDIASTINAL LYMPHADENOPATHY 01/21/2016   . Hilar lymphadenopathy- bilataeral  HILAR LYMPHADENOPATHY 01/21/2016   . Chest pain CHEST PAIN 01/21/2016   . Dyspnea and respiratory abnormality DYSPNEA 01/21/2016   . Abnormal EKG ELECTROCARDIOGRAM ABNORMAL 01/21/2016   . Opacity of lung on imaging study ABNORMAL FINDINGS ON DIAGNOSTIC IMAGING OF LUNG 01/21/2016   . Liver lesion- small hypodense lesion around falciform lig LESION OF LIVER 01/21/2016      OBJECTIVE:     Diet Orders:   Procedures   . Diet Dysphagia Minced; No - patient to receive automatic trays     Height: 167.6 cm (66) Admit weight: 101.4 kg (223 lb 8.7 oz) Last charted weight: 107 kg (235 lb 14.3 oz) IBW: 59.3 kg Body mass index is 38.07 kg/(m^2).    I/O last 3 completed shifts:  In: 3200.6 [P.O.:1740; I.V.:1190.6; Other:50; IV Piggyback:220]  Out: 3485 [Urine:3435; Stool:50]    Recent Labs      02/08/16   0417   02/09/16   0356  02/09/16   1446  02/09/16   2325  02/10/16   0550   NA  138   --   140   --    --   141   K  3.6   < >  3.3*  3.5  3.9  3.5   CL  103   --   105   --    --   107   CO2  22.2   --  22.6   --    --   23.9   GLU  98   --   102*   --    --   96   BUN  83*   --   73*   --    --   62*   CREATININES  2.99*   --   2.40*   --    --   1.96*   CALCIUM  9.5   --   9.3   --    --   9.7   ALBUMIN  2.3*   --   2.5*   --    --   2.5*    < > = values in this interval not displayed.     Recent Labs      02/07/16   1829   CAION  0.80*     Recent Labs      02/08/16   0417   02/09/16   0356  02/09/16   1446  02/09/16   2325  02/10/16   0550   MG  2.0   < >  1.8  1.9  2.0  1.8   PHOS  4.9*   --   3.5   --    --   2.4*    < > = values in this interval not displayed.     Recent Labs      02/09/16   1158  02/09/16   1624  02/09/16   2051  02/10/16   0032   02/10/16   0550  02/10/16   0757   POCGLU  96  105*  96  96  83  102*     Recent Labs      02/08/16   0416  02/09/16   0355  02/10/16   0550   WHITEBLOODCE  18.6*  15.5*  15.9*   NEUTROABS  14.5*  13.2*  14.3*   BANDSABS  0.4  0.2   --    HGB  7.1*  7.1*  7.0*   HCT  22.5*  22.9*  23.3*   LABPLAT  404*  426*  466*     Temp (24hrs), Avg:36.7 ?C (98 ?F), Min:36.4 ?C (97.5 ?F), Max:37 ?C (98.6 ?F)    Relevant Microbiology:  Date: Source: Results:  Status:   8/26 Sputum 1+ yeast, not further work-up Final   8/20 Bronc Wash  Viral/Fungal Viral panel negative; fungal negative  No bacterial growth (final) Final   8/20 BAL No Growth Final   8/19 Blood x 4 No Growth Final   8/19 ET Asp No Growth Final     Warfarin Dosing Record:  Date: 8/31   INR: 2.9   Warfarin: 3 mg     ASSESSMENT:     Estimated CrCl: 38 mL/min (CG AdjBW) per [employer] [facility]  BUN/SCr Ratio: 32    CRRT 8/20-5/25    Critical Care Prophylaxis:  VAP: None, not intubated   VTE: HIT positive   Argatroban infusion continues   SUP: Protonix 40 mg IV BID  GI bleed 8/26     CBG Monitoring:   ? CBG goal in critically ill: 140-180  ? CBG in past 24H: 86-162    Antimicrobial Monitoring:   Indication for antimicrobials: Sepsis of unknown origin (per Intensivist 8/19)  Possible PNA   Current antimicrobials: Levaquin 750 mg IV Q48H (8/29-8/31)   Relevant previous antimicrobials: Levaquin 250 mg IV x 1 (8/29)  Levaquin 500 mg IV  Q48H (8/27-8/28)  Levaquin 750 mg IV Q24H (8/21-8/25)  Levaquin 750 mg IV Q48H (8/19)   # of days of therapy directed at source of infection or pathogen: Day 13 of 14   Comments about duration? Dependent on source  General sepsis: 7-10 days (Surviving Sepsis Campaign)   Notes about antimicrobial therapy or microbiology data?      Argatroban infusion:  ? 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 Thrombosis in right lower extremity on 8/22  ? Argatroban at 0.8 mcg/kg/min   ? APTT = 61  ? Day 1 overlap with  warfarin    Recent vitamin K administration: None noted in Epic   Warfarin Indication:  Afib, thrombosis in RLE on 8/22   Goal INR: 2-3 (4-6 while on argatroban)   Today's INR: Subtherapeutic   Home warfarin dose: N/A   CHA2DS2-VASc score: 4 (Prior thromboembolism: 2; NSTEMI: 1; Female: 1)   Additional anticoagulant/antiplatelet:  Argatroban 0.8 mcg/kg/min   Comments about potential drug, disease or dietary interactions? Amiodarone, acetaminophen, levofloxacin, and pantoprazole may increase risk of bleeding   Warfarin education status: Not given     PLAN:     Critical Care Prophylaxis:  ? Prophylactic medications ordered    ? PLT > 150K  ? Argatroban continues (HIT positive with RLE DVT), warfarin initiated  ? Monitor for s/sx of bleeding    Antimicrobial Interventions:   ? Levaquin continues for possible PNA.  ? Intensivist wants total 14 days, stop date entered.    Renal Dosing:   ? No new renal dose adjustments at this time.   ? Pharmacy will continue to follow      IV to PO:   ? Per Intensivist note 8/29: strict aspiration precautions     Warfarin:  ? Due to multiple risk factors for increased warfarin sensitivity, including amiodarone, recent valve replacement, recent GI bleed, will start lower initial dose.  ? Start warfarin 3 mg.  ? Pharmacy will continue to follow daily INR    Signed by: Dannielle Karvonen, [facility], PharmD  Pharmacy Resident

## 2016-02-10 NOTE — Progress Notes (Signed)
LUE TLC PICC dressing changed per protocol-pt tol well

## 2016-02-10 NOTE — Treatment Summary (Signed)
IP PT TREATMENT NOTE     [name] / [birth year] DOB:  [birth year] / 60 y.o. Date:  02/10/2016     Precautions  Specific mobility precautions: Sternal precautions  Sternal precautions: Standard  Required Braces/Orthotics: None     ASSESSMENT     Summary:  Pt is very motivated, continues to be profoundly weak but strength is improving. Feet very sore (ischemic changes in toes) as well as peri area.      Activity tolerance: Tolerates 30 min activity     Precaution Awareness: Fully aware of precaution(s)  Deficit Awareness: Decreased awareness of deficits  Correction of Errors: Self-corrects with cues  Safety Judgment: Decreased awareness of need for assistance     Consult recommendations      Discharge recommendations  Mobility Equipment needs: To be determined later   Primary recommendation : Patient would benefit from SNF/Nursing Home/Other Skilled Facility.  Recommended transport method: Wheelchair  Secondary recommendation: Patient would benefit from intensive rehabilitation and is expected to be able to participate in at least 3 hrs of treatment daily  Justification: far from previous baseline, medically complex, motivated      PLAN     Treatment Plan Frequency    Plan: Continue per established POC.  Pt. demonstrates limitations which require skilled PT intervention.  1x (5 days/week).  Frequency will be increased as pt's condition or discharge plan indicates.     Jerriyah Louis will remain on the Physical Therapy schedule until goals are met, or there has been a change in the Plan of Care.          Treatment Diagnosis Surgery / # Days Post-op (if applicable)    Primary Dx: Resp failure  Treatment Dx: altered mobility  Procedure(s) (LRB):  AORTIC VALVE REPLACEMENT; INTRA AORTIC BALLOON INSERTION (N/A) / 14 Days Post-Op       SUBJECTIVE        Pain Level  Current status: Pain somewhat limits pt's ability to participate in therapy  Pain at start of session: 2  Pain at end of session: 6  Pain at  rest: 4  Pain with activity: 6  Pain location: Foot;Perineum  Pain descriptors: Sore     OBJECTIVE        Mental status  General Demeanor: Pleasant;Cooperative  LOC: Alert  Orientation: Oriented x 4  Directions: Follows one-step commands  --> Follows one-step commands: Consistently  Attention span: Appears intact  Memory: Appears intact in social/therapy situations   Medical appliances  Medical Appliances: Oxygen;Rectal Tube/[employer];Telemetry;ECG;BP monitor;SPO2 monitor   Vitals       Other         TREATMENT  Bed Mobility/Transfers  Bed mobility       Rolling / logroll         Transition to sitting up Supine to Sit: NA   To resting position      Transition to sitting down NA     Transition to supine NA     Scooting     Transfers      Transition to standing From bed: NA  From chair: Max Assist;Verbal cues;Tactile cues;Visual cues;Tech required for balance/safety     Method     Pt states she did pivot to chiar with nursing  Gait/Stairs       Ther. Ex.  LE exercises: Ankle pumps;Leg press;Gluteal sets;Hip abd/adduction;Seated long arc quads  Addit. Exercises: trunk ext/abd sets, deep breathing    Balance  Balance - Basic  Static  sitting balance: Assist level;Position  Assist level: SBA;CGA  Position: Feet - supported  Time: 5 min   Training/Education  Trainee(s): Primary Learner  Primary Learner: Patient  Primary Learner - Barriers to Learning?: Yes  Primary Learner - Specific barriers: Physical  Training provided: exercises she can do during day  Response to training: receptive   Safety/Room Set-up   Call button accessible?: Yes  Phone accessible?: No  Oxygen reconnected?: No, on room air  Position on arrival: In regular chair  Position on departure (upright): In regular chair (hoyer under for ease/safety  back to bed)     GOALS     Patient/Caregiver goals reviewed and integrated with rehab treatment plan:     Multidisciplinary Problems (Active)        Problem: Mobility: Ambulation    Goal Priority Disciplines  Outcome   Gait (with precautions)     PT    Description:  Pt. to ambulate 50 feet with one person min assist, using FWW, following any pertinent precautions.    Gait Soil scientist)     PT    Description:               Problem: Mobility: Transfers    Goal Priority Disciplines Outcome   Supine to/from sitting (with precautions)     PT    Description:  Pt. to transfer to/from sitting with one person min assist,  following any pertinent precautions.    Sitting to/from standing (with precautions)     PT    Description:  Pt. to transfer sitting to/from standing with one person min assist, following any pertinent precautions.    Bed to/from chair (with precautions)     PT    Description:  Pt. to transfer bed to/from chair with one person min assist, following any pertinent precautions.           Problem: Patient/Family Goal    Goal Priority Disciplines Outcome   Normal Status     PT    Description:  Pt. wants to return to previous level of function.     Mobility     PT    Description:  Patient wants to walk again.           Problem: Strength    Goal Priority Disciplines Outcome   Home Exercise Program (with precautions)     PT    Description:  Pt. to perform basic HEP independently following any pertinent precautions.                   G-Codes/PSFS/FIM Scores: (if applicable)          First session Second session (if applicable)    Start/Stop times: 1000 - 1030 Start/Stop times:   - Stop Time:     Total time: 30 minutes  Total time:   minutes     This note to serve as a Discharge Summary if Abaigeal Moomaw is discharged from the hospital or from therapy services.     Therapist:  Humphrey Rolls, PTA

## 2016-02-10 NOTE — Progress Notes (Signed)
CARDIOTHORACIC SURGERY CRITICAL CARE NOTE     Name: Brandy [name]  DOB: 1956-06-04 60 y.o.  MRN: [id number]  CSN: [id number]    Brandy [name] is a 60 y.o. female patient. Patient was admitted with Respiratory failure (CMS/HCC)  Who had Procedure(s):  AORTIC VALVE REPLACEMENT; INTRA AORTIC BALLOON INSERTION.  14 Days Post-Op    HPI: The patient is a 60 y.o. female with the below medical co-morbid conditions.    ?  1. Severe symptomatic aortic stenosis                        Ejection fraction 30%     Found on TEE intra-op to be 15%                        Normal coronaries                        Predominant symptom dyspnea                     2. Severe Pulmonary Hypertension  3. Mild to Moderate Mitral regurgitation  4. Morbid obesity                        Weight loss 50 pounds in the last one year  5. respiratory insufficiency  6. Non-ST elevation MI                        The demand ischemia  7. Iron deficiency anemia                        CT chest abdomen and pelvis reviewed no abdominal masses                        Likely AVM secondary to aortic stenosis not a good candidate for sedation and EGD or colonoscopy well aortic stenosis is so critical  ?  Patient's a 60 year old female who presented the [facility] falls in respiratory distress unable to breathe.  There are her troponin was elevated she underwent oxygen therapy and was given Lasix.  Cardiac workup showed normal coronary arteries with depressed LV function on echo and severe aortic stenosis valve area less than 0.4 velocity 4.4 m/s with an EF 30%.  She was transfused 2 units of PRBCs A [facility] hospital with a hemoglobin 8.8.  His immobility volume overload from heart failure  ?  Patient hasn't been to the doctor in years she hasn't taken any medications nor does she take good care of herself.  [place] she is unable to consistently take meds.  She likes to use and asked to [employer] wood to heat her house.    ?  After a few  days of Lasix patient states that she was breathing much better she is 60% better per her suggestion.  I had a long talk with the family about the disease course why she was getting short of breath and discussion of heart failure.  They understand that she is given a need aortic valve replacement she wishes to have tissue valve I do think she is a better surgical candidate and have her given her age and risk.  ?  She has very poor dentition    POD #1  Patient has  been stable on balloon 1:2   Methylene blue last night SVR much better   Coronary perfusion good   Today I am managing acute heart failure with management of the IABP.    POD #2  removal of the intra-aortic balloon pump renal ultrasound completed showing minimal flow to the kidneys  patient still making urine  POD #3 CRRT started  POD #4 CRRT running acid base more stable pressures much better off NEO and LEVOPHED improved making urine  POD#5 CRRT continues   Patient plt 25   Will check for DVT   Postoperative day #6 CRRT continues platelets now up to 32 treating for heparin-induced from cytopenia  Postoperative day #7 Trialysis Cath replaced secondary to malfunction.  On agatroban still hemodynamics improving oxygen requirements lower.  Postoperative day #8 making transition to hemodialysis   Postoperative day #9 +12 liters still  Will need HD likely     POD #10:  11.8 L positive. Planning hemodialysis today.  remains in A. Fib on the amiodarone protocol.  POD#11: 8 liters positive.  No other changes  POD#12: still in AFIB rates 110-130   Multiple amiodarone loads without success...digoxin not good option secondary to renal failure  POD#13 doing well  Cardioversion by Dr. Truddie Hidden  POD#14 no major issues   In NSR    Subjective: making progress    Assessment & Plan:     Active Hospital Problems    Diagnosis SNOMED CT(R) Date Noted   . Heparin induced thrombocytopenia (CMS/HCC) HEPARIN-INDUCED THROMBOCYTOPENIA 02/02/2016   . S/P AVR (aortic valve replacement)  HISTORY OF AORTIC VALVE REPLACEMENT 01/27/2016   . Respiratory failure (CMS/HCC) RESPIRATORY FAILURE 01/23/2016   . Aortic stenosis, severe AORTIC VALVE STENOSIS 01/22/2016   . NSTEMI (non-ST elevated myocardial infarction) (CMS/HCC) ACUTE NON-ST SEGMENT ELEVATION MYOCARDIAL INFARCTION 01/21/2016   . Demand ischemia of myocardium (CMS/HCC) ACUTE ISCHEMIC HEART DISEASE 01/21/2016   . Iron deficiency anemia IRON DEFICIENCY ANEMIA 01/21/2016   . Obesity OBESITY 01/21/2016       Scheduled Medications:   . albuterol  2.5 mg Nebulization 4x daily   . atorvastatin  20 mg Oral Nightly   . carvedilol  3.125 mg Oral BID with meals   . ferric gluconate (FERRLECIT) IVPB  125 mg Intravenous Daily   . insulin aspart   Subcutaneous 6 times per day   . levofloxacin (LEVAQUIN) IV  750 mg Intravenous Q48H   . nystatin   Topical 2 times per day   . pantoprazole  40 mg Intravenous 2 times per day   . sodium chloride 0.9 % (NS) syringe  10 mL Intravenous Q8H SCH   . sodium chloride 0.9 % (NS) syringe  5-10 mL Intravenous Daily     Infusions:   . amiodarone in D5W 0.5 mg/min (02/10/16 0051)   . argatroban 0.8 mcg/kg/min (02/09/16 2055)   . clevidipine Stopped (02/02/16 0800)   . dexmedetomidine Stopped (02/05/16 1402)   . dextrose 10% (D10W)     . dextrose 5% lactated ringers Stopped (01/29/16 1829)   . DOBUTamine (DOBUTREX) 250 mg in D5W 250 mL (1000 mcg/mL) infusion     . midazolam Stopped (01/31/16 0900)   . milrinone Stopped (02/02/16 0736)   . nitroglycerin     . norepinephrine Stopped (02/05/16 1203)   . phenylephrine (NEO-SYNEPHRINE) 40 mg in NaCl 0.9 % 500 mL (80 mcg/mL) infusion **DOUBLE CONCENTRATION** Stopped (01/31/16 0515)   . propofol Stopped (02/05/16 0910)   . vasopressin (PITRESSIN) 50 units in  NaCl 0.9 % 250 mL (0.2 units/mL) infusion **ADULT SHOCK** Stopped (01/28/16 1711)     Recent Labs:  Lab Results   Component Value Date    NA 140 02/09/2016    K 3.9 02/09/2016    K 3.69 01/23/2016    CL 105 02/09/2016    CL 104.1  01/23/2016    CO2 22.6 02/09/2016    CO2 20 (L) 01/23/2016    GLU 102 (H) 02/09/2016    GLU 85 01/23/2016    BUN 73 (H) 02/09/2016    CREATININES 2.40 (H) 02/09/2016    CALCIUM 9.3 02/09/2016    AST 26 02/04/2016    ALT 10 02/04/2016    ALKPHOS 121 (H) 02/04/2016    BILITOT 0.90 01/21/2016    ALBUMIN 2.5 (L) 02/09/2016    GLOB 3.0 02/04/2016    AGRATIO 0.9 (L) 02/04/2016    ANIONGAP 12.4 (H) 02/09/2016    ANIONGAP 21.0 (H) 01/23/2016    LABGLOM 21 (L) 02/09/2016    LABGLOM >60.0 01/23/2016    WHITEBLOODCE 15.5 (H) 02/09/2016    LABPLAT 426 (H) 02/09/2016    HGB 7.1 (L) 02/09/2016    PROTIME 22.3 (H) 02/03/2016    INR 2.0 (H) 02/03/2016    APTT 58 (H) 02/09/2016          Vital Signs:      ? Cardiac output   ? Cardiac Index   Temp: 36.7 ?C (98.1 ?F) BP: 106/67 Pulse: 100 Resp: (!) 26 SpO2: 96 % on O2 Flow Rate (L/min): 2 L/min None (Room air)   Intake/Output:     CT OUTPUT: removed     URINE OUTPUT: 2000  +7 literss    Exam:  Neuro: extubated and normal neuro exam  Cardiac: Atrial fibrillation irregular no murmurs  Lungs;clear  ABDOMEN:abdomen is soft without significant tenderness, masses, organomegaly or guarding  EXTREMITIES 2+ edema in the lower extremities toes appear much better digital ischemia much improved    PLAN:   Neurologic: No issues sedated     Cardiac: Pre-op EF:   EF 15% prior to surgery   Post op 30% with good valve function 23mm valve     1. Inotropes off   2. Pressors: off   3. Rhythm: NSR   AMIODARONE   4. Aspirin: holding    5. Beta-Blocker: carvedilol 6.25    6. Statin: atorvastatin    7. ACE/ARB: hold        Respiratory:    Smoker none   Pulmonary toilet    Xray: no issues  On levaquin still     Gastrointestinal:   GI bleeding suspected prior to surgery   Anemic and received 2 units PRBC in [place]   Had melena post op       GI CONSULT NEEDED WHEN STABLE     Genitourinary: creat: pre op 0.9   1.25>2.16>2.76>3.15>2.4  OFF CRRT 8/25 in AM    Infectious Disease:  WBC: 18>15    Hematology: HGB 7.1   PLTS 404         hitt  ++++   Stopped all heparin and started agatroban  Thrombocytopenia resolved     Endocrine: Glucose management   Electrolytes: replacing Mg/K/Ca  Prophylaxis Agatroban PPI/H2 Blocker          [employer]

## 2016-02-10 NOTE — Progress Notes (Signed)
Intensivist notified of updated labs: K+ 3.9, Mg+ 2.0. No need to replace per MD. Will check morning labs and continue to monitor. Elane Fritz, RN

## 2016-02-10 NOTE — Progress Notes (Signed)
Critical Care Progress Note:    Subjective: The patient is a 60 year non smoking female with BMI 40 and?progressive dyspnea since June.   ??  8/11 Admitted to [facility] ?with respiratory distress, pulmonary edema, NSTEMIC, anemia requiring 2 units PRBCs. ECHO showed severe aortic stenosis, EF 30%. Heart cath demonstrated no significant coronary occlusion. ?  ??  8/13 Transferred to [facility].   ??  8/17 Dr. Debroah Baller performed aortic valve replacement and right femoral IABP placement. Intraoperative EF was noted to be 15%. The patient was brought to the CCU post operatively. She required multiple vasopressors and inotropes and also received methylene blue. Nebulized flolan for pHTN.   ??  8/18 IABP was removed.   ??  8/19 Swan clotted and was removed. Levaquin started. Flolan stopped.   ??  8/20 Temp HD catheter was placed on and CRRT initiated. Bronchoscopy performed due to fever. Foley removed, new foley placed.   ??  8/21 Still?intubated. Milrinone 0.375 mcg/kg/min,?levophed at 10 mcg/min. CRRT. Due to ischemic appearing digits right radial aline removed, left radial aline placed. No purposeful response during sedation holiday. Enteral feeds started.   ??  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 [place] confirmed RLE DVT. Patient remained?intubated on levophed, milrinone with?CRRT running. She received IV amiodarone for afib. IV hydrocortisone started given persistence of circulatory shock (requiring 9 mcg/min levophed plus milrinone).  ??  8/23 Patient awakened with sedation holiday. Spontaneous breathing attempted, appeared weak for extubation. ?Milrinone has been weaned off, levophed is down to 3 mcg/min. ECHO: EF30-35%, AV prosthesis OK, moderate mitral regurgitation, evidence of RV pressure or volume overload. ?.   ??  8/24 Patient awakened, followed commands. Left IJ dialysis catheter stopped?functioning. LUE?PICC placed, right IJ TLC removed, ew right IJ temporary HD cath placed, left IJ temp HD cath removed.  ??  ??  8/25 Still appeared too weak for extubation.   ??  8/26 The patient passed some bloody stools.Extubation. ?Post extubation she required bipap alternating with heat high flow NC O2. Reported pain in her feet.   ??  8/27:?had good response to lasix. As urine output is improving, no plans for dialysis. Respiratory status appears improved. Failed SLP.  ??  8/28: Continues on amiodarone infusion with afib and vent rates of about 120bpm. Worked with PT this morning. 'Breathing fine'. Strong productive cough. Passed FEES study for pureed diet.  ??  8/29: she was weaned off supplemental oxygen.  Continued to make good urine with improving creatinine.  ?  8/30: Underwent successful DC cardioversion.  Diuresis continues - Cr even better this morning. Remains in sinus rhythm.    Medications: Reviewed    Objective:    Physical Exam:  Vitals:    02/10/16 0900   BP:    Pulse: 108   Resp: (!) 28   Temp:    SpO2: 97%     Gen: ?60 y.o.yr old ?female?resting comfortably in bed.  HEENT: Atraumatic, room air  Neck: Supple  Chest: sternotomy  Lungs: clear to ausculation  Cardiac: sinus tach  Abdm: Soft, non-tender  Ext: ischemic digits right hand/toes, pedal edema  Neuro: Awake, oriented, generalized weakness improving    Labs: reviewed    Imaging: reviewed personally and independently    Assessment:  ?  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  ??  Plan:    Neurologic: PT/OT. Analgesia as required.   ??  Pulmonary: Pulmonary status has?improved?with diuresis. Room air.?  ??  Cardiac: Plan per CT surgery. Electrical cardioversion succeessful. Continue Amio.    ID: Stop Levaquin.  If she develops fever and/or progressive leukocytosis, blood cultures should be obtained.    Renal: Remove Foley. HD catheter removed. Cr improving.   ??  GI: dysphagia minced diet per SLP - strict aspiration precautions. Given her iron deficiency anemia on admission, blood per rectum during hospitalization, she will need colonoscopy at some point when more clinically stable. She will need GI consult and urgent endoscopy if her H/H drops more precipitously.   ??  Endocrine: Insulin sliding scale as needed to maintain euglycemia.   ??  Heme: H/H 7.0????Platelets 466. ?Argatroban for HIT. Famotidine switched to BID IV pantoprazole on 8/26 given GI bleeding.   ??  Code Status: full  ?  Critical care time spent: 30 mins    [place] Camillo [place]

## 2016-02-10 NOTE — Treatment Summary (Signed)
SPEECH LANGUAGE PATHOLOGY DAILY NOTE - INPATIENT    Patient name: Brandy [name]  Room #: 2007  Date: 02/10/2016    ASSESSMENT   Activity Tolerance: good activity tolerance. Pt /c improved voicing. Pt educated to use increased breath support for loudness. She was able to have conversation /c guests sitting approx 5-6 feet away /c min difficulty. 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  Pt would like ice chips at bedside. Please give pt a sip of liquid before PO intake.   Solid Diet Level: Dysphagia Minced  Liquid Diet Level: thin/regular  Medication Administration: crushed, give with puree  Compensatory Swallowing Strategies: upright as possible for all oral intake, do not eat or drink less than 2 hours before bedtime, set-up required, assist as needed with meals, oral care per policy, monitor temperature and lung sounds, moisten food with sauces, gravy, etc., avoid dry, crumbly foods, avoid thick/sticky foods, monitor for fatigue, start meal with sip of liquid for moisture/lubrication, alternate bites and sips, effortful swallow, small bites/sips, straws safe         PLAN   Skilled services of an SLP required to improve: swallowing function. Plan for next session: Reassess diet tolerance   Frequency of Treatment: 5x/week. Discharge Recommendations: to be determined        SUBJECTIVE   Behavior/Cognition: alert;cooperative;pleasant mood;hardworking Patient Positioning: upright in chair Respiratory Status: room air Precautions/Complicating Factors: fall risk, supplemental oxygen, generalized weakness, decreased endurance, aspiration risk        Family/Caregiver Present: yes - spouse/significant other;other visitor(s)    Pain Limitations: pain does not limit patient's ability to participate in therapy   Pain Score: 1 Pain Location: legs               OBJECTIVE    . Treatment Objective: improve swallow;improve diet tolerance/intake               Vrinda made the following progress toward goals today:  Goals Objective  Data   Goal 1: Pt will tolerate least restrictive p.o. diet consistencies with swallow WFL for safe and adequate nutrition/hydration Pt tol approx 4 oz of smoothie.  No wet voice present    Goal 2: Patient will perform swallowing exercises with >80% accuracy with min cues/models.  Pt trained in hard swallow.  Pt demonstrated /c rare verbal cue. Demonstrated good lip seal over straw              Speech Language Pathologist: Teresa Pelton, CCC-SLP    Start/Stop Time: 1100 - 1124       Total Time: 24 minutes  SLP - Next Appointment: 02/11/16  [place] SLP Treatment Charges  $Swallow Treatment [phone]): 23-37 mins

## 2016-02-10 NOTE — Discharge Planning (AHS/AVS) (Signed)
Received t/c from [employer] from [facility]. Please call him with discharge disposition [phone].

## 2016-02-10 NOTE — Progress Notes (Signed)
Improving trends continue. Continues with good urinary output improving kidney function. Creatinine steadily better now 2.4-1.9.  She has significant iron deficiency anemia and I have ordered 8 doses of intravenous Ferrlecit. She has been tolerating this without difficulty.    Dialysis catheter removed yesterday.    Will sign off please call nephrology if needed.

## 2016-02-10 NOTE — Progress Notes (Signed)
Nursing Critical Care End of Shift Note      SUBJECTIVE:    ? Important trends/observation and significant events: Up to chair when mod-max assist of 2. Minimal to no PVC's.    ? General Progress for shift (include pain management if applicable): Pt slept well during the shift. Up to chair for about 1hr for xray. Pain to coccyx, no breakdown noted.       PATIENT/FAMILY:    ? Patient preferences: Laying on side for comfort.     ? Family needs and concerns:  N/A    ? Recommendations for next shift: Pulmonary push, PT push. Needs to really improve strength.

## 2016-02-10 NOTE — Treatment Summary (Signed)
IP OT TREATMENT NOTE    [name] / 2007/2007-01 DOB:  02-Mar-1956 / 60 y.o. Date:  02/10/2016     Precautions  Specific mobility precautions: Sternal precautions  Sternal Precautions: Standard     ASSESSMENT     Summary:  Pt tolerated bilateral UE AROM/AAROM exercises with no resistance. Bilateral shoulder flex and AB duction <90 degrees, R<L. Pt instructed on elevation and use of therafoam for ROM for edema with bilateral UE's. Pt displaying significant weakness and decreased activity tolerance.      Activity tolerance: Tolerates 30 min activity with multiple rests  Unable to Tolerate Activity: Excessive muscle fatigue  Comments: Pt has significant weakness and decreased activity tolerance.      Correction of Errors: Self-corrects with cues     Consult recommendations      Discharge recommendations  Mobility Equipment needs: To be determined later   Primary recommendation : Patient would benefit from SNF/Nursing Home/Other Skilled Facility.  Secondary recommendation: Patient would benefit from intensive rehabilitation and is expected to be able to participate in at least 3 hrs of treatment daily.  Justification: SNF vs [name] pending pt progress.         PLAN     Treatment Plan Frequency    Continue per established POC.  Pt. demonstrates limitations which require skilled OT intervention.  1x (5 days/week) Frequency will be increased as pt's condition or discharge plan indicates.     Brandy [name] will remain on the Occupational Therapy schedule until goals are met, or there has been a change in the Plan of Care.          Treatment Diagnosis Surgery / # Days Post-op (if applicable)    Primary Dx: Respiratory failure  Procedure(s) (LRB):  AORTIC VALVE REPLACEMENT; INTRA AORTIC BALLOON INSERTION (N/A) / 14 Days Post-Op       SUBJECTIVE   Pt agreeable to OT treatment.      Pain Level  Current status: Pain does not limit pt's ability to participate in therapy  Pain at start of session: 0 - No Pain  Pain  at end of session: 0 - No Pain     OBJECTIVE   B UE AROM/AAROM, increased AROM, though shoulder flexion and AB duction <90 degrees. Therafoam and elevation for UE edema.      Mental status  Mental Status  General Demeanor: Pleasant;Cooperative  LOC: Alert  Directions: Follows one-step commands  --> Follows one-step commands: Consistently  Attention span: Appears intact  Memory: Appears intact in social/therapy situations  Cognition  Initiation: Appears intact   TREATMENT  Strength  Upper extremities: < 3/5 bilaterally  Narrative: Continuing to display increased AROM, far below baseline. Bilateral UE shoulder flex and AB duction AROM <90 degrees with R < L.    Training/Education  Trainee(s): Primary Learner;Co-Learner  Primary Learner: Patient  Primary Learner - Barriers to Learning?: Yes  Primary Learner - Specific barriers: Physical  Co-Learner: Spouse/Significant other;Family (Sister and spouse present)  Co-Learner - Barriers to Learning?: No  Training provided: B UE exercises.   Response to training: Receptive   Safety/Room set-up   Call button accessible?: Yes  Position on arrival: In bed  Position on departure (bed): Bed alarm on;Bed in lowest position;Sitter/Family present     GOALS     Patient/Caregiver goals reviewed and integrated with rehab treatment plan:     Multidisciplinary Problems (Active)        Problem: ADLs    Goal Priority  Disciplines Outcome   ADL Activity Tolerance for Self Care     OT    Description:  Pt  to tolerate 15  minutes of self care activity in seated position    Caregiver     OT    Description:  Caregiver will demonstrate the ability to assist with patient's care, as needed.           Problem: Edema    Goal Priority Disciplines Outcome   Stiffness/Joint mobility     OT    Description:  Pt's edema will be managed to prevent long term stiffness and loss of joint mobility with ROM, elevation, etc.             Problem: IP Post Cardiac Goal List    Goal Priority Disciplines Outcome      Self-Care UE     OT    Description:  Patient to perform  upper self-care, following sternal precautions with min assistance.    Self-Care LE     OT    Description:  Patient to perform lower body self-care with ,min assistance, except TED hose.      Toilet Transfers     OT    Description:  Patient to be able to complete modified toilet transfers with min assistance.     Bath/Shower Transfers     OT    Description:  Patient to perform  modified shower transfers with  min assist.    Precautions     OT    Description:  Patient to follow sternal  precautions, while performing ADLs.           Problem: ROM    Goal Priority Disciplines Outcome   AROM     OT    Description:  Patient to increase movement in B UE joint(s) to [facility].             Problem: Safety    Goal Priority Disciplines Outcome   Safety DME     OT    Description:  Pt/Caregiver to demonstrate awareness of DME needs for safety upon discharge.           Problem: Strength    Goal Priority Disciplines Outcome   Functional Strength     OT    Description:  Patient will use both UEs bilaterally, in self- care/ADLs.      Fine Motor Tasks     OT    Description:  Pt. to be able to use upper extremities for fine motor tasks.                G-Codes/PSFS/FIM Scores: (if applicable)          First session Second session (if applicable)    Start/Stop times: 1332 - 1414 Start/Stop times:   - Stop Time:     Total time: 42 minutes  Total time:   minutes     This note to serve as a Discharge Summary if Rihana Kiddy is discharged from the hospital or from therapy services.     Therapist:  Werner Lean, OT

## 2016-02-11 ENCOUNTER — Inpatient Hospital Stay: Admit: 2016-02-11 | Payer: MEDICAID

## 2016-02-11 LAB — POCT GLUCOSE: POC Glucose: 118 mg/dL — ABNORMAL HIGH (ref 80–99)

## 2016-02-11 LAB — CBC WITH AUTO DIFFERENTIAL
Basophils %: 0 % (ref 0–2)
Basophils, Absolute: 0 10*3/ÂµL (ref 0.0–0.2)
Eosinophils %: 2 % (ref 0–7)
Eosinophils, Absolute: 0.3 10*3/ÂµL (ref 0.0–0.7)
HCT: 23.5 % — ABNORMAL LOW (ref 37.0–48.0)
Hemoglobin: 7.2 g/dL — ABNORMAL LOW (ref 12.0–16.0)
Lymphocytes %: 2 % — ABNORMAL LOW (ref 25–45)
Lymphocytes, Absolute: 0.3 10*3/ÂµL — ABNORMAL LOW (ref 1.1–4.3)
MCH: 22.7 pg — ABNORMAL LOW (ref 27.0–34.0)
MCHC: 30.8 g/dL — ABNORMAL LOW (ref 32.0–36.0)
MCV: 73.8 fL — ABNORMAL LOW (ref 81.0–99.0)
MPV: 7.8 fL (ref 7.4–10.4)
Monocytes %: 4 % (ref 0–12)
Monocytes, Absolute: 0.7 10*3/ÂµL (ref 0.0–1.2)
Neutrophils %: 92 % — ABNORMAL HIGH (ref 35–70)
Neutrophils, Absolute: 15.6 10*3/ÂµL — ABNORMAL HIGH (ref 1.6–7.3)
Platelet Count: 461 10*3/ÂµL — ABNORMAL HIGH (ref 150–400)
Platelet Estimate: INCREASED — AB
RBC: 3.19 10*6/ÂµL — ABNORMAL LOW (ref 4.20–5.40)
RDW: 26.5 % — ABNORMAL HIGH (ref 11.5–14.5)
WBC: 17 10*3/ÂµL — ABNORMAL HIGH (ref 4.8–10.8)

## 2016-02-11 LAB — RENAL FUNCTION PANEL
Albumin: 2.5 g/dL — ABNORMAL LOW (ref 3.5–5.0)
Anion Gap: 8.9 mmol/L (ref 3.0–11.0)
BUN: 47 mg/dL — ABNORMAL HIGH (ref 6–23)
CO2 - Carbon Dioxide: 22.1 mmol/L (ref 21.0–31.0)
Calcium: 9.4 mg/dL (ref 8.6–10.3)
Chloride: 108 mmol/L (ref 98–111)
Creatinine: 1.78 mg/dL — ABNORMAL HIGH (ref 0.55–1.10)
GFR Estimate: 29 mL/min/{1.73_m2} — ABNORMAL LOW (ref >=60)
Glucose: 115 mg/dL — ABNORMAL HIGH (ref 80–99)
Phosphorus: 1.8 mg/dL — ABNORMAL LOW (ref 2.5–4.5)
Potassium: 3.7 mmol/L (ref 3.5–5.1)
Sodium: 139 mmol/L (ref 135–143)

## 2016-02-11 LAB — APTT: APTT: 64 Seconds — ABNORMAL HIGH (ref 23–36)

## 2016-02-11 LAB — PROTIME-INR
INR: 3 — ABNORMAL HIGH (ref 0.9–1.2)
Protime: 34 Seconds — ABNORMAL HIGH (ref 10.0–13.0)

## 2016-02-11 LAB — POTASSIUM: Potassium: 3.4 mmol/L — ABNORMAL LOW (ref 3.5–5.1)

## 2016-02-11 MED ORDER — amiodarone (PACERONE) tablet 400 mg
200 | Freq: Two times a day (BID) | ORAL | Status: DC
Start: 2016-02-11 — End: 2016-02-17
  Administered 2016-02-11 – 2016-02-17 (×13): 200 mg via ORAL

## 2016-02-11 MED ORDER — potassium chloride SA (K-DUR,KLOR-CON) CR tablet 40 mEq
10 | Freq: Once | ORAL | Status: AC
Start: 2016-02-11 — End: 2016-02-10
  Administered 2016-02-11: 05:00:00 10 meq via ORAL

## 2016-02-11 MED FILL — ATORVASTATIN 20 MG TABLET: 20 mg | ORAL | Qty: 1

## 2016-02-11 MED FILL — AMIODARONE 200 MG TABLET: 200 mg | ORAL | Qty: 1

## 2016-02-11 MED FILL — COUMADIN 3 MG TABLET: 3 mg | ORAL | Qty: 1

## 2016-02-11 MED FILL — KLOR-CON M10 MEQ TABLET,EXTENDED RELEASE: 10 meq | ORAL | Qty: 4

## 2016-02-11 MED FILL — CARVEDILOL 3.125 MG TABLET: 3.125 mg | ORAL | Qty: 2

## 2016-02-11 MED FILL — PROTONIX 40 MG INTRAVENOUS SOLUTION: 40 mg | INTRAVENOUS | Qty: 40

## 2016-02-11 MED FILL — ARGATROBAN 50 MG/50 ML (1 MG/ML) IN SODIUM CHLORIDE (ISO-OSMOTIC) IV: 50 mg/50 mL (1 mg/mL) | INTRAVENOUS | Qty: 50

## 2016-02-11 MED FILL — AMIODARONE 200 MG TABLET: 200 mg | ORAL | Qty: 2

## 2016-02-11 MED FILL — SODIUM FERRIC GLUCONATE COMPLEX IN SUCROSE 62.5 MG/5 ML INTRAVENOUS: 62.5 | INTRAVENOUS | Qty: 10

## 2016-02-11 MED FILL — NEXTERONE 360 MG/200 ML (1.8 MG/ML) INTRAVENOUS SOLUTION: 360 mg/200 mL (1.8 mg/mL) | INTRAVENOUS | Qty: 360

## 2016-02-11 NOTE — Treatment Summary (Signed)
IP PT TREATMENT NOTE     [name] / 2007/2007-01 DOB:  11/21/1955 / 60 y.o. Date:  02/11/2016     Precautions  Specific mobility precautions: Sternal precautions  Sternal precautions: Standard  Required Braces/Orthotics: None     ASSESSMENT     Summary:  Pt improving slowly, able to take steps in place + static stand.  Hemodynamically stable although heart rate increases to 108 standing.      Activity tolerance: Tolerates 30 min activity     Precaution Awareness: Fully aware of precaution(s)  Deficit Awareness: Decreased awareness of deficits  Correction of Errors: Self-corrects with cues  Safety Judgment: Decreased awareness of need for assistance     Consult recommendations      Discharge recommendations  Mobility Equipment needs: To be determined later   Primary recommendation : Patient would benefit from SNF/Nursing Home/Other Skilled Facility.  Recommended transport method: Wheelchair  Secondary recommendation: Patient would benefit from intensive rehabilitation and is expected to be able to participate in at least 3 hrs of treatment daily  Justification: far from previous baseline, medically complex, motivated      PLAN     Treatment Plan Frequency    Plan: Continue per established POC.  Pt. demonstrates limitations which require skilled PT intervention.  1x (5 days/week).  Frequency will be increased as pt's condition or discharge plan indicates.     Brandy [name] will remain on the Physical Therapy schedule until goals are met, or there has been a change in the Plan of Care.          Treatment Diagnosis Surgery / # Days Post-op (if applicable)    Primary Dx: Resp failure  Treatment Dx: altered mobility  Procedure(s) (LRB):  AORTIC VALVE REPLACEMENT; INTRA AORTIC BALLOON INSERTION (N/A) / 15 Days Post-Op       SUBJECTIVE        Pain Level  Current status: Pain somewhat limits pt's ability to participate in therapy  Pain at start of session: 3  Pain at end of session: 4  Pain at rest:  3  Pain with activity: 4  Pain location: Foot;Perineum  Pain descriptors: Sore     OBJECTIVE        Mental status  General Demeanor: Pleasant;Cooperative  LOC: Alert  Orientation: Oriented x 4  Directions: Follows one-step commands  --> Follows one-step commands: Consistently  Attention span: Appears intact  Memory: Appears intact in social/therapy situations   Medical appliances  Medical Appliances: Oxygen;IV   Vitals       Other         TREATMENT  Bed Mobility/Transfers  Bed mobility       Rolling / logroll         Transition to sitting up Supine to Sit: NA   To resting position      Transition to sitting down Min Assist     Transition to supine NA     Scooting NA   Transfers      Transition to standing From bed: NA  From chair: Min Assist     Method Stand pivot: Min Assist;Tech required for balance/safety      Gait/Stairs       Ther. Ex.  LE exercises: Ankle pumps;Heelslides;Quad sets;Seated long arc quads  Addit. Exercises: trunk ext/abd sets, deep breathing    Balance  Balance - Basic  Static sitting balance: Assist level;Position  Assist level: SBA;CGA  Position: Feet - supported   Training/Education  Trainee(s): Primary Learner  Primary Learner: Patient  Primary Learner - Barriers to Learning?: Yes  Primary Learner - Specific barriers: Physical  Training provided: sternal precautions  Response to training: receptive   Safety/Room Set-up   Call button accessible?: Yes  Phone accessible?: No  Oxygen reconnected?: No, on room air  Position on arrival: In regular chair  Position on departure (upright): In regular chair     GOALS     Patient/Caregiver goals reviewed and integrated with rehab treatment plan:     Multidisciplinary Problems (Active)        Problem: Mobility: Ambulation    Goal Priority Disciplines Outcome   Gait (with precautions)     PT    Description:  Pt. to ambulate 50 feet with one person min assist, using FWW, following any pertinent precautions.    Gait Soil scientist)     PT    Description:                Problem: Mobility: Transfers    Goal Priority Disciplines Outcome   Supine to/from sitting (with precautions)     PT    Description:  Pt. to transfer to/from sitting with one person min assist,  following any pertinent precautions.    Sitting to/from standing (with precautions)     PT    Description:  Pt. to transfer sitting to/from standing with one person min assist, following any pertinent precautions.    Bed to/from chair (with precautions)     PT    Description:  Pt. to transfer bed to/from chair with one person min assist, following any pertinent precautions.           Problem: Patient/Family Goal    Goal Priority Disciplines Outcome   Normal Status     PT    Description:  Pt. wants to return to previous level of function.     Mobility     PT    Description:  Patient wants to walk again.           Problem: Strength    Goal Priority Disciplines Outcome   Home Exercise Program (with precautions)     PT    Description:  Pt. to perform basic HEP independently following any pertinent precautions.                   G-Codes/PSFS/FIM Scores: (if applicable)          First session Second session (if applicable)    Start/Stop times: 1530 - 1615 Start/Stop times:   - Stop Time:     Total time: 45 minutes  Total time:   minutes     This note to serve as a Discharge Summary if Brandy [name] is discharged from the hospital or from therapy services.     Therapist:  Humphrey Rolls, PTA

## 2016-02-11 NOTE — Progress Notes (Signed)
Nutrition Follow-Up    Brandy [name] is a 60 y.o. female, DOB Apr 09, 1956    Subjective:  Recent intake values reflect: 0-50% meals. Today is day #7 of poor PO intake without nutrition support. Spoke with pt and family in room. Pt and family in room state that pt's appetite has been poor and she has not been eating well. Pt tells me that her appetite has been poor for the last few months. Pt has not had any unplanned wt loss, but does have edema which is likely masking wt loss. Pt has been drinking smoothies and does like fruit. Pt drank a milkshake brought in by family yesterday. Pt would be willing to try milkshake once per day and continue smoothie BID. Pt also enjoys fruit and sprite. Pt does not wish to receive snacks between meals. There is no food that sounds good to pt.       Admit Anthropometric Measurements:   Ht: 167.6 cm (66)        Admit Wt: 101.4 kg (224 lbs)  IBW: 59.09 kg (130 lbs)   %IBW: 172%  BMI: 36.1 (no edema)   Ht/wt status:obese class 2     No appreciable wt loss since admission likely d/t volume status.               Nutrition-Focused Physical Findings:  Patient appears well-nourished and well-hydrated   1+ generalized edema, 1+ bilateral UE edema, 2+ bilateral LE edema, and 2+ bilateral pedal edema noted per RN assessment yesterday (8/31).  Poor dentition per physician note   Nutrition risk factors: Current morbidity   Risk for nutritional deficit:high       Food and Nutritional Intake:  Diet Orders:   Procedures   . Diet Dysphagia Minced; No - patient to receive automatic trays     PO Intake is inadequate; pt Is eating 0-50% of meals.     Other Allergies: Heparin and Sulfa (sulfonamide antibiotics)     Client History:  Admission Diagnosis: Respiratory failure (CMS/HCC) [J96.90]    Diagnosis:     SNOMED CT(R)   1. Heparin induced thrombocytopenia (CMS/HCC)  HEPARIN-INDUCED THROMBOCYTOPENIA   2. Aortic stenosis, severe  AORTIC VALVE STENOSIS   3. Acute systolic heart failure (CMS/HCC)   ACUTE SYSTOLIC HEART FAILURE   4. Demand ischemia of myocardium (CMS/HCC)  ACUTE ISCHEMIC HEART DISEASE   5. Non-ischemic cardiomyopathy (CMS/HCC)  CARDIOMYOPATHY   6. Iron deficiency anemia due to chronic blood loss  IRON DEFICIENCY ANEMIA   7. NSTEMI (non-ST elevated myocardial infarction) (CMS/HCC)  ACUTE NON-ST SEGMENT ELEVATION MYOCARDIAL INFARCTION   8. Obesity due to excess calories, unspecified obesity severity  SIMPLE OBESITY   9. S/P AVR (aortic valve replacement)  HISTORY OF AORTIC VALVE REPLACEMENT   10. Elevated brain natriuretic peptide (BNP) level  HORMONE LEVEL - FINDING   11. Tachycardia  TACHYCARDIA   12. Pleural effusion  PLEURAL EFFUSION   13. Acute respiratory failure with hypoxia (CMS/HCC)  ACUTE RESPIRATORY FAILURE   14. Thrombocytosis (CMS/HCC)  THROMBOCYTOSIS   15. Weight loss  WEIGHT DECREASED   16. Family history of cancer  FAMILY HISTORY OF CANCER   17. Cardiomegaly  CARDIOMEGALY   18. Mediastinal lymphadenopathy  MEDIASTINAL LYMPHADENOPATHY   19. Hilar lymphadenopathy- bilataeral   HILAR LYMPHADENOPATHY   20. Dyspnea and respiratory abnormality  DYSPNEA   21. Abnormal EKG  ELECTROCARDIOGRAM ABNORMAL   22. Opacity of lung on imaging study  ABNORMAL FINDINGS ON DIAGNOSTIC IMAGING OF LUNG   23.  Liver lesion- Terressa Evola hypodense lesion around falciform lig  LESION OF LIVER   24. Respiratory failure, unspecified chronicity, unspecified whether with hypoxia or hypercapnia (CMS/HCC)  RESPIRATORY FAILURE     Past Medical History:   Diagnosis Date   . History of blood transfusion      Past Surgical History:   Procedure Laterality Date   . PROCEDURE N/A 01/27/2016    Procedure: AORTIC VALVE REPLACEMENT; INTRA AORTIC BALLOON INSERTION;  Surgeon: Otho Bellows, MD;  Location: [facility] OR;  Service: Cardiology;  Laterality: N/A;   . TONSILLECTOMY     . TUBAL LIGATION     . TUBAL LIGATION  1980       Usual meds include: Reviewed   Current meds include: Pepcid, lasix, insulin, zofran, potassium  chloride      Biochemical Data, Medical Tests and Procedures:  Lab Results   Component Value Date    NA 139 02/11/2016    K 3.7 02/11/2016    CL 108 02/11/2016    CO2 22.1 02/11/2016    GLU 115 (H) 02/11/2016    BUN 47 (H) 02/11/2016    CREATININES 1.78 (H) 02/11/2016    LABCREA 77 01/28/2016    MG 1.8 02/10/2016    PHOS 1.8 (L) 02/11/2016   Phos is BNL - replace     Nutrition Diagnosis (Problem, Etiology, Signs and Symptoms)  Inadequate protein-energy intake related to poor appetite as evidenced by PO intake of 0-50% of meals and pt report of poor appetite.   (Progress:  Not applicable.)    Nutrition Intervention:   - Continue current diet per SLP; will follow for PO adequacy.  - Will continue to strawberry smoothie with Beneprotein BID.  - Will send 12 oz chocolate milkshake with benecal and beneprotein once per day. Each milkshake will provide 725 kcals and 27g protein.  - Will send fruit with every tray.       Monitoring and Evaluation:   Will continue to follow and monitor PO intake and labs per nutrition protocol.  PO goal: >70% of meals/supplements.      Follow Up: 1-3 days      Teola Bradley, RD  02/11/2016  8:28 AM  [facility]

## 2016-02-11 NOTE — Progress Notes (Signed)
PHARMACIST CRITICAL CARE PROGRESS NOTE     Consult Per P&T Protocol: Critical Care Physician Service, Antimicrobial Interventions, Renal Dosing, IV to PO         Summary: Nasirah LEONA DANN is [name] admitted on 01/23/2016 with a NSTEMI s/p R&L heart catheterization. PICC line placed 8/25. CRRT from 8/20-8/25. 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 GI bleed on 8/26, currently on PPI BID.    Allergies: Heparin and Sulfa (sulfonamide antibiotics)    PMH:  has a past medical history of History of blood transfusion.     Surgical history:  has a past surgical history that includes Tonsillectomy; Tubal ligation; Tubal ligation (1980); and ocedure (N/A, 01/27/2016).    Active problems:   Patient Active Problem List    Diagnosis SNOMED CT(R) Date Noted   . Heparin induced thrombocytopenia (CMS/HCC) HEPARIN-INDUCED THROMBOCYTOPENIA 02/02/2016   . S/P AVR (aortic valve replacement) HISTORY OF AORTIC VALVE REPLACEMENT 01/27/2016   . Non-ischemic cardiomyopathy (CMS/HCC) CARDIOMYOPATHY 01/23/2016   . Respiratory failure (CMS/HCC) RESPIRATORY FAILURE 01/23/2016   . Aortic stenosis, severe AORTIC VALVE STENOSIS 01/22/2016   . Acute systolic heart failure (CMS/HCC) ACUTE SYSTOLIC HEART FAILURE 01/22/2016   . Heart failure (CMS/HCC) HEART FAILURE 01/21/2016   . Elevated brain natriuretic peptide (BNP) level HORMONE LEVEL - FINDING 01/21/2016   . NSTEMI (non-ST elevated myocardial infarction) (CMS/HCC) ACUTE NON-ST SEGMENT ELEVATION MYOCARDIAL INFARCTION 01/21/2016   . Demand ischemia of myocardium (CMS/HCC) ACUTE ISCHEMIC HEART DISEASE 01/21/2016   . Tachycardia TACHYCARDIA 01/21/2016   . Pleural effusion PLEURAL EFFUSION 01/21/2016   . Obesity OBESITY 01/21/2016   . Acute respiratory failure with hypoxia (CMS/HCC) ACUTE RESPIRATORY FAILURE 01/21/2016   . Hypotension LOW BLOOD PRESSURE 01/21/2016   . Iron deficiency anemia IRON DEFICIENCY ANEMIA 01/21/2016   . Thrombocytosis (CMS/HCC) THROMBOCYTOSIS  01/21/2016   . Weight loss WEIGHT DECREASED 01/21/2016   . Family history of cancer FAMILY HISTORY OF CANCER 01/21/2016   . Cardiomegaly CARDIOMEGALY 01/21/2016   . Mediastinal lymphadenopathy MEDIASTINAL LYMPHADENOPATHY 01/21/2016   . Hilar lymphadenopathy- bilataeral  HILAR LYMPHADENOPATHY 01/21/2016   . Chest pain CHEST PAIN 01/21/2016   . Dyspnea and respiratory abnormality DYSPNEA 01/21/2016   . Abnormal EKG ELECTROCARDIOGRAM ABNORMAL 01/21/2016   . Opacity of lung on imaging study ABNORMAL FINDINGS ON DIAGNOSTIC IMAGING OF LUNG 01/21/2016   . Liver lesion- small hypodense lesion around falciform lig LESION OF LIVER 01/21/2016      OBJECTIVE:     Diet Orders:   Procedures   . Diet Dysphagia Minced; No - patient to receive automatic trays     Height: 167.6 cm (66) Admit weight: 101.4 kg (223 lb 8.7 oz) Last charted weight: 108 kg (238 lb 1.6 oz) IBW: 59.3 kg Body mass index is 38.43 kg/(m^2).    I/O last 3 completed shifts:  In: 2731.3 [P.O.:1775; I.V.:621.3; Other:50; IV Piggyback:285]  Out: 2825 [Urine:2775; Stool:50]    Recent Labs      02/09/16   0356   02/10/16   0550  02/10/16   1948  02/11/16   0549   NA  140   --   141   --   139   K  3.3*   < >  3.5  3.4*  3.7   CL  105   --   107   --   108   CO2  22.6   --   23.9   --   22.1   GLU  102*   --  96   --   115*   BUN  73*   --   62*   --   47*   CREATININES  2.40*   --   1.96*   --   1.78*   CALCIUM  9.3   --   9.7   --   9.4   ALBUMIN  2.5*   --   2.5*   --   2.5*    < > = values in this interval not displayed.     No results for input(s): CAION in the last 72 hours.  Recent Labs      02/09/16   0356  02/09/16   1446  02/09/16   2325  02/10/16   0550  02/11/16   0549   MG  1.8  1.9  2.0  1.8   --    PHOS  3.5   --    --   2.4*  1.8*     Recent Labs      02/10/16   0032  02/10/16   0550  02/10/16   0757  02/10/16   1217  02/10/16   1605  02/10/16   1944   POCGLU  96  83  102*  162*  91  118*     Recent Labs      02/09/16   0355  02/10/16   0550   02/11/16   0549   WHITEBLOODCE  15.5*  15.9*  17.0*   NEUTROABS  13.2*  14.3*  15.6*   BANDSABS  0.2   --    --    HGB  7.1*  7.0*  7.2*   HCT  22.9*  23.3*  23.5*   LABPLAT  426*  466*  461*     Temp (24hrs), Avg:36.4 ?C (97.5 ?F), Min:36.2 ?C (97.2 ?F), Max:36.6 ?C (97.9 ?F)    Warfarin Dosing Record:  Date: 8/31 9/1   INR: 2.9 3   Warfarin: 3 mg 3 mg     ASSESSMENT:     Estimated CrCl: 42 mL/min (CG AdjBW) per [employer] [facility]  BUN/SCr Ratio: 26    CRRT 8/20-8/25    Critical Care Prophylaxis:  VAP: None, not intubated   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 Warfarin initiated 8/31   SUP: Protonix 40 mg IV BID  GI bleed 8/26     CBG Monitoring:   ? CBG goal in critically ill: 140-180  ? CBG in past 24H: 115-162    Argatroban infusion:  ? 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 Thrombosis in right lower extremity on 8/22  ? Argatroban at 0.8 mcg/kg/min   ? APTT = 64  ? Day 2 overlap with warfarin    Recent vitamin K administration: None noted in Epic   Warfarin Indication:  Afib, thrombosis in RLE on 8/22   Goal INR: 2-3 (4-6 while on argatroban)   Today's INR: Subtherapeutic   Home warfarin dose: N/A   CHA2DS2-VASc score: 4 (Prior thromboembolism: 2; NSTEMI: 1; Female: 1)   Additional anticoagulant/antiplatelet:  Argatroban 0.8 mcg/kg/min   Comments about potential drug, disease or dietary interactions? Amiodarone, argatroban, acetaminophen, and pantoprazole may increase risk of bleeding   Warfarin education status: Not given     PLAN:     Critical Care Prophylaxis:  ? Prophylactic medications ordered    ? PLT > 150K  ? Argatroban continues (HIT positive with RLE DVT), warfarin overlap  initiated  ? Monitor for s/sx of bleeding    Renal Dosing:   ? No new renal dose adjustments at this time.   ? Pharmacy will continue to follow      IV to PO:   ? Per Intensivist note 8/29: strict aspiration precautions     Warfarin:  ? Continue warfarin 3 mg.  ? Pharmacy  will continue to follow daily INR    Signed by: Dannielle Karvonen, [facility], PharmD  Pharmacy Resident

## 2016-02-11 NOTE — Other (Signed)
IP PT TREATMENT NOTE (NOT SEEN)    [name] DOB:  10-14-1955 / 60 y.o. Date:  02/11/2016     ASSESSMENT     No treatment provided.  Brandy [name] was not seen for the following reason:    Refusal Very tired, didn't sleep, has been up to commode/chair, wants to defer until after she visits with husband.      PLAN     Rhoda Waldvogel will remain on the Physical Therapy schedule until goals are met, or there has been a change in the Plan of Care.          Treatment Diagnosis Surgery / # Days Post-op (if applicable)    Primary Dx: Resp failure  Treatment Dx: altered mobility  Procedure(s) (LRB):  AORTIC VALVE REPLACEMENT; INTRA AORTIC BALLOON INSERTION (N/A) / 15 Days Post-Op       SUBJECTIVE        OBJECTIVE   NA          Session times   Start/Stop Times:   -     Total Time:   minutes      This note to serve as a Discharge Summary if Aiya Keach is discharged from the hospital or from therapy services.     Therapist:  Humphrey Rolls, PTA

## 2016-02-11 NOTE — Progress Notes (Signed)
Critical Care Progress Note:    Subjective: The patient is a 60 year non smoking female with BMI 40 and?progressive dyspnea since June.   ??  8/11 Admitted to [facility] ?with respiratory distress, pulmonary edema, NSTEMIC, anemia requiring 2 units PRBCs. ECHO showed severe aortic stenosis, EF 30%. Heart cath demonstrated no significant coronary occlusion. ?  ??  8/13 Transferred to [facility].   ??  8/17 Dr. Debroah Baller performed aortic valve replacement and right femoral IABP placement. Intraoperative EF was noted to be 15%. The patient was brought to the CCU post operatively. She required multiple vasopressors and inotropes and also received methylene blue. Nebulized flolan for pHTN.   ??  8/18 IABP was removed.   ??  8/19 Swan clotted and was removed. Levaquin started. Flolan stopped.   ??  8/20 Temp HD catheter was placed on and CRRT initiated. Bronchoscopy performed due to fever. Foley removed, new foley placed.   ??  8/21 Still?intubated. Milrinone 0.375 mcg/kg/min,?levophed at 10 mcg/min. CRRT. Due to ischemic appearing digits right radial aline removed, left radial aline placed. No purposeful response during sedation holiday. Enteral feeds started.   ??  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 [place] confirmed RLE DVT. Patient remained?intubated on levophed, milrinone with?CRRT running. She received IV amiodarone for afib. IV hydrocortisone started given persistence of circulatory shock (requiring 9 mcg/min levophed plus milrinone).  ??  8/23 Patient awakened with sedation holiday. Spontaneous breathing attempted, appeared weak for extubation. ?Milrinone has been weaned off, levophed is down to 3 mcg/min. ECHO: EF30-35%, AV prosthesis OK, moderate mitral regurgitation, evidence of RV pressure or volume overload. ?.   ??  8/24 Patient awakened, followed commands. Left IJ dialysis catheter stopped?functioning. LUE?PICC placed, right IJ TLC removed, ew right IJ temporary HD cath placed, left IJ temp HD cath removed.  ??  ??  8/25 Still appeared too weak for extubation.   ??  8/26 The patient passed some bloody stools.Extubation. ?Post extubation she required bipap alternating with heat high flow NC O2. Reported pain in her feet.   ??  8/27:?had good response to lasix. As urine output is improving, no plans for dialysis. Respiratory status appears improved. Failed SLP.  ??  8/28: Continues on amiodarone infusion with afib and vent rates of about 120bpm. Worked with PT this morning. 'Breathing fine'. Strong productive cough. Passed FEES study for pureed diet.  ??  8/29: she was weaned off supplemental oxygen.  Continued?to make good urine with improving creatinine.  ??  8/30: Underwent successful DC cardioversion.    8/31: Diuresis continues - Cr even better this morning. Remains in sinus rhythm.    Sitting up in chair with family vsiting.    Medications: Reviewed    Objective:    Physical Exam:  Vitals:    02/11/16 0600   BP: (!) 101/58   Pulse: 95   Resp: 21   Temp:    SpO2: 95%     Gen: ?60 y.o.yr old ?female?resting comfortably in her chair.  HEENT: Atraumatic, room air  Neck: Supple  Chest: sternotomy  Lungs: clear to ausculation  Cardiac: NSR  Abdm: Soft, non-tender  Ext: ischemic digits right hand/toes, pedal edema  Neuro: Awake, oriented, generalized weakness improving    Labs: reviewed    Imaging: reviewed personally and independently    Assessment:  ??  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  ??  Plan:    Neurologic: PT/OT. Analgesia as required.   ??  Pulmonary: Pulmonary status has?improved?with diuresis. Room air.?  ??  Cardiac: Plan per CT surgery. Electrical  cardioversion succeessful. Continue Amio, aspirin, statin, beta blocker.    ID: Stopped Levaquin. If she develops fever and/or progressive leukocytosis, blood cultures should be obtained. Monitor leukocytosis.    Renal: Removed Foley. HD catheter removed. Cr improving.   ??  GI: dysphagia minced diet per SLP - strict aspiration precautions. Given her iron deficiency anemia on admission, blood per rectum during hospitalization, she will need colonoscopy at some point when more clinically stable. She will need GI consult and urgent endoscopy if her H/H drops more precipitously.   ??  Endocrine: Insulin sliding scale as needed to maintain euglycemia.   ??  Heme: H/H 7.2??Platelets 461. ?Argatroban for HIT. Famotidine switched to BID IV pantoprazole on 8/26 given GI bleeding. INR 3.0  ??  Code Status: full  ??  Critical care time spent: 30 mins    Brandy [name]

## 2016-02-11 NOTE — Treatment Summary (Signed)
IP OT TREATMENT NOTE    [name] / 2007/2007-01 DOB:  June 08, 1956 / 60 y.o. Date:  02/11/2016     Precautions  Specific mobility precautions: Sternal precautions  Sternal Precautions: Standard     ASSESSMENT     Summary:  Pt sitting in chair upon arrival and stating she is fatigued today for not getting much sleep last night. Pt willing to work with OT. Pt completed grooming tasks of washing face and combing hair with mod A for tasks due to decreased bilateral UE strength for tasks. Pt completed therapeutic activities for FM coordination with various sizes of caps for in-hand manipulation skills. Pt continues to display decreased activity tolerance and weakness, though progressing and making improvements daily.      Activity tolerance: Tolerates 30 min activity with multiple rests  Unable to Tolerate Activity: Excessive muscle fatigue     Deficit Awareness: Fully aware of deficits  Correction of Errors: Self-corrects with cues     Consult recommendations      Discharge recommendations  Mobility Equipment needs: To be determined later   Primary recommendation : Patient would benefit from SNF/Nursing Home/Other Skilled Facility.  Secondary recommendation: Patient would benefit from intensive rehabilitation and is expected to be able to participate in at least 3 hrs of treatment daily.  Justification: SNF vs [name] pending pt progress.         PLAN     Treatment Plan Frequency    Continue per established POC.  Pt. demonstrates limitations which require skilled OT intervention.  1x (5 days/week) Frequency will be increased as pt's condition or discharge plan indicates.     Brandy [name] will remain on the Occupational Therapy schedule until goals are met, or there has been a change in the Plan of Care.          Treatment Diagnosis Surgery / # Days Post-op (if applicable)    Primary Dx: Respiratory failure  Procedure(s) (LRB):  AORTIC VALVE REPLACEMENT; INTRA AORTIC BALLOON INSERTION (N/A) / 15 Days  Post-Op       SUBJECTIVE   Pt agreeable to OT treatment.      Pain Level  Current status: Pain somewhat limits pt's ability to participate in therapy  Pain at start of session: 0 - No Pain  Pain at end of session: 0 - No Pain  Pain location: Foot (Bilateral feet. )  Wong-Baker FACES: 2 - Hurts little bit     OBJECTIVE   ADL's of self-care tasks of washing face and combing hair with mod A. FM tasks of finger to hand and hand to finger translation, rotation with various sizes of lids for facilitating of hand arch as well as instructed on use of therafoam for tip pinch, pad pinch and lateral pinch. Pt continues to display decreased FM coordination, especially with R hand due to necrotic pointer finger.      Mental status  Mental Status  General Demeanor: Pleasant;Cooperative  LOC: Alert  Directions: Follows one-step commands  --> Follows one-step commands: Consistently  Attention span: Appears intact  Memory: Appears intact in social/therapy situations  Cognition  Initiation: Appears intact   ADLs  Where assessed: Chair  Self Feeding: Independent with Set-up  Grooming: Mod Assist   Training/Education  Trainee(s): Primary Learner  Primary Learner: Patient  Primary Learner - Barriers to Learning?: Yes  Primary Learner - Specific barriers: Physical  Training provided: ADL's, FM coordination skills  Response to training: Receptive   Safety/Room set-up  Call button accessible?: Yes  Position on arrival: In regular chair  Comments: Pt left as upon arrival.      GOALS     Patient/Caregiver goals reviewed and integrated with rehab treatment plan:     Multidisciplinary Problems (Active)        Problem: ADLs    Goal Priority Disciplines Outcome   ADL Activity Tolerance for Self Care     OT    Description:  Pt  to tolerate 15  minutes of self care activity in seated position    Caregiver     OT    Description:  Caregiver will demonstrate the ability to assist with patient's care, as needed.           Problem: Edema    Goal  Priority Disciplines Outcome   Stiffness/Joint mobility     OT    Description:  Pt's edema will be managed to prevent long term stiffness and loss of joint mobility with ROM, elevation, etc.             Problem: IP Post Cardiac Goal List    Goal Priority Disciplines Outcome   Self-Care UE     OT    Description:  Patient to perform  upper self-care, following sternal precautions with min assistance.    Self-Care LE     OT    Description:  Patient to perform lower body self-care with ,min assistance, except TED hose.      Toilet Transfers     OT    Description:  Patient to be able to complete modified toilet transfers with min assistance.     Bath/Shower Transfers     OT    Description:  Patient to perform  modified shower transfers with  min assist.    Precautions     OT    Description:  Patient to follow sternal  precautions, while performing ADLs.           Problem: ROM    Goal Priority Disciplines Outcome   AROM     OT    Description:  Patient to increase movement in B UE joint(s) to [facility].             Problem: Safety    Goal Priority Disciplines Outcome   Safety DME     OT    Description:  Pt/Caregiver to demonstrate awareness of DME needs for safety upon discharge.           Problem: Strength    Goal Priority Disciplines Outcome   Functional Strength     OT    Description:  Patient will use both UEs bilaterally, in self- care/ADLs.      Fine Motor Tasks     OT    Description:  Pt. to be able to use upper extremities for fine motor tasks.                G-Codes/PSFS/FIM Scores: (if applicable)          First session Second session (if applicable)    Start/Stop times: 0945 - 1024 Start/Stop times:   - Stop Time:     Total time: 39 minutes  Total time:   minutes     This note to serve as a Discharge Summary if Brandy [name] is discharged from the hospital or from therapy services.     Therapist:  Werner Lean, OT

## 2016-02-11 NOTE — Progress Notes (Signed)
RESPIRATORY ASSESSMENT    SUBJECTIVE: Pt reports that she is feeling better       OBJECTIVE:   Current Vital Signs:    Pre and post C/S: slight crackles in RLL   HR:  88   RR:  24              Cough: moderate productive   SpO2:   96 on room air    ABG Results: none  Vent/Bipap Settings: none   CXR: 9/1 0554 IMPRESSION:    ?  Stable hazy bibasilar opacities, probable mild hydrostatic edema, cardiomegaly,   and probable small LEFT pleural effusion.  ?  Stable support equipment.    ASSESSMENT:  60  Year old female pt resting right side, no obvious resp distress, moderate productive cough.       PLAN: encourage DB/C and IS, continue QID [place]       GOAL(S):  Medication:   Maximize airflow    Pulmonary/Bronchial Hygiene:  Improve mucocilliary clearance  Prevent or correct atelectasis      Supplemental Oxygen:   Maintain SpO2 >92   %  Normalize and maintain an adequate SpO2/PaO2 appropriate for patient clinical situation    Per PDP # 102 and MD orders    E.S.  Lin Landsman, RRT

## 2016-02-11 NOTE — Other (Signed)
SPEECH LANGUAGE PATHOLOGY BRIEF NOTE - INPATIENT     Patient name: Brandy [name]  Room #: 2007    Pt declined any PO trials .She stated she had one strawberry for lunch and appeared to drink about 30% of her chocolate shake for dinner.    Progress has been very slow d/t pt decreased appetite. She states she is not hungry and has no cravings.     Speech Language Pathologist: Teresa Pelton, CCC-SLP  Date: 02/11/2016  Time:  6:54 PM    SLP - Next Appointment: 02/15/16

## 2016-02-11 NOTE — Progress Notes (Signed)
CARDIOTHORACIC SURGERY CRITICAL CARE NOTE     Name: Brandy [name]  DOB: 1956-02-26 60 y.o.  MRN: [id number]  CSN: [id number]    Brandy [name] is a 60 y.o. female patient. Patient was admitted with Respiratory failure (CMS/HCC)  Who had Procedure(s):  AORTIC VALVE REPLACEMENT; INTRA AORTIC BALLOON INSERTION.  15 Days Post-Op    HPI: The patient is a 60 y.o. female with the below medical co-morbid conditions.    ?  1. Severe symptomatic aortic stenosis                        Ejection fraction 30%     Found on TEE intra-op to be 15%                        Normal coronaries                        Predominant symptom dyspnea                     2. Severe Pulmonary Hypertension  3. Mild to Moderate Mitral regurgitation  4. Morbid obesity                        Weight loss 50 pounds in the last one year  5. respiratory insufficiency  6. Non-ST elevation MI                        The demand ischemia  7. Iron deficiency anemia                        CT chest abdomen and pelvis reviewed no abdominal masses                        Likely AVM secondary to aortic stenosis not a good candidate for sedation and EGD or colonoscopy well aortic stenosis is so critical  ?  Patient's a 60 year old female who presented [facility] falls in respiratory distress unable to breathe.  There are her troponin was elevated she underwent oxygen therapy and was given Lasix.  Cardiac workup showed normal coronary arteries with depressed LV function on echo and severe aortic stenosis valve area less than 0.4 velocity 4.4 m/s with an EF 30%.  She was transfused 2 units of PRBCs A [facility] hospital with a hemoglobin 8.8.  His immobility volume overload from heart failure  ?  Patient hasn't been to the doctor in years she hasn't taken any medications nor does she take good care of herself.  [place] she is unable to consistently take meds.  She likes to use and asked to [employer] wood to heat her house.    ?  After a few  days of Lasix patient states that she was breathing much better she is 60% better per her suggestion.  I had a long talk with the family about the disease course why she was getting short of breath and discussion of heart failure.  They understand that she is given a need aortic valve replacement she wishes to have tissue valve I do think she is a better surgical candidate and have her given her age and risk.  ?  She has very poor dentition    POD #1  Patient has  been stable on balloon 1:2   Methylene blue last night SVR much better   Coronary perfusion good   Today I am managing acute heart failure with management of the IABP.    POD #2  removal of the intra-aortic balloon pump renal ultrasound completed showing minimal flow to the kidneys  patient still making urine  POD #3 CRRT started  POD #4 CRRT running acid base more stable pressures much better off NEO and LEVOPHED improved making urine  POD#5 CRRT continues   Patient plt 25   Will check for DVT   Postoperative day #6 CRRT continues platelets now up to 32 treating for heparin-induced from cytopenia  Postoperative day #7 Trialysis Cath replaced secondary to malfunction.  On agatroban still hemodynamics improving oxygen requirements lower.  Postoperative day #8 making transition to hemodialysis   Postoperative day #9 +12 liters still  Will need HD likely     POD #10:  11.8 L positive. Planning hemodialysis today.  remains in A. Fib on the amiodarone protocol.  POD#11: 8 liters positive.  No other changes  POD#12: still in AFIB rates 110-130   Multiple amiodarone loads without success...digoxin not good option secondary to renal failure  POD#13 doing well  Cardioversion by Dr. Truddie Hidden  POD#14 no major issues   In NSR  POD#15 NSR doing well needs rehab     Subjective: making progress    Assessment & Plan:     Active Hospital Problems    Diagnosis SNOMED CT(R) Date Noted   . Heparin induced thrombocytopenia (CMS/HCC) HEPARIN-INDUCED THROMBOCYTOPENIA 02/02/2016   .  S/P AVR (aortic valve replacement) HISTORY OF AORTIC VALVE REPLACEMENT 01/27/2016   . Respiratory failure (CMS/HCC) RESPIRATORY FAILURE 01/23/2016   . Aortic stenosis, severe AORTIC VALVE STENOSIS 01/22/2016   . NSTEMI (non-ST elevated myocardial infarction) (CMS/HCC) ACUTE NON-ST SEGMENT ELEVATION MYOCARDIAL INFARCTION 01/21/2016   . Demand ischemia of myocardium (CMS/HCC) ACUTE ISCHEMIC HEART DISEASE 01/21/2016   . Iron deficiency anemia IRON DEFICIENCY ANEMIA 01/21/2016   . Obesity OBESITY 01/21/2016       Scheduled Medications:   . albuterol  2.5 mg Nebulization 4x daily   . amiodarone  400 mg Oral 2 times per day   . atorvastatin  20 mg Oral Nightly   . carvedilol  6.25 mg Oral BID with meals   . ferric gluconate (FERRLECIT) IVPB  125 mg Intravenous Daily   . nystatin   Topical 2 times per day   . pantoprazole  40 mg Intravenous 2 times per day   . sodium chloride 0.9 % (NS) syringe  10 mL Intravenous Q8H SCH   . sodium chloride 0.9 % (NS) syringe  5-10 mL Intravenous Daily   . warfarin  3 mg Oral Daily     Infusions:   . amiodarone in D5W Stopped (02/11/16 1235)   . argatroban 0.8 mcg/kg/min (02/11/16 0844)   . clevidipine Stopped (02/02/16 0800)   . dexmedetomidine Stopped (02/05/16 1402)   . dextrose 10% (D10W)     . dextrose 5% lactated ringers Stopped (01/29/16 1829)   . DOBUTamine (DOBUTREX) 250 mg in D5W 250 mL (1000 mcg/mL) infusion     . milrinone Stopped (02/02/16 0736)   . nitroglycerin     . norepinephrine Stopped (02/05/16 1203)   . phenylephrine (NEO-SYNEPHRINE) 40 mg in NaCl 0.9 % 500 mL (80 mcg/mL) infusion **DOUBLE CONCENTRATION** Stopped (01/31/16 0515)   . vasopressin (PITRESSIN) 50 units in NaCl 0.9 % 250 mL (0.2 units/mL) infusion **ADULT  SHOCK** Stopped (01/28/16 1711)     Recent Labs:  Lab Results   Component Value Date    NA 139 02/11/2016    K 3.7 02/11/2016    K 3.69 01/23/2016    CL 108 02/11/2016    CL 104.1 01/23/2016    CO2 22.1 02/11/2016    CO2 20 (L) 01/23/2016    GLU 115 (H)  02/11/2016    GLU 85 01/23/2016    BUN 47 (H) 02/11/2016    CREATININES 1.78 (H) 02/11/2016    CALCIUM 9.4 02/11/2016    AST 26 02/04/2016    ALT 10 02/04/2016    ALKPHOS 121 (H) 02/04/2016    BILITOT 0.90 01/21/2016    ALBUMIN 2.5 (L) 02/11/2016    GLOB 3.0 02/04/2016    AGRATIO 0.9 (L) 02/04/2016    ANIONGAP 8.9 02/11/2016    ANIONGAP 21.0 (H) 01/23/2016    LABGLOM 29 (L) 02/11/2016    LABGLOM >60.0 01/23/2016    WHITEBLOODCE 17.0 (H) 02/11/2016    LABPLAT 461 (H) 02/11/2016    HGB 7.2 (L) 02/11/2016    PROTIME 34.0 (H) 02/11/2016    INR 3.0 (H) 02/11/2016    APTT 64 (H) 02/11/2016          Vital Signs:      ? Cardiac output   ? Cardiac Index   Temp: 36.4 ?C (97.5 ?F) BP: 99/64 Pulse: 84 Resp: (!) 26 SpO2: 96 % on O2 Flow Rate (L/min): 2 L/min None (Room air)   Intake/Output:     CT OUTPUT: removed     URINE OUTPUT: 2000  +7 literss    Exam:  Neuro: extubated and normal neuro exam  Cardiac: Atrial fibrillation irregular no murmurs  Lungs;clear  ABDOMEN:abdomen is soft without significant tenderness, masses, organomegaly or guarding  EXTREMITIES 2+ edema in the lower extremities toes appear much better digital ischemia much improved    PLAN:   Neurologic: No issues sedated     Cardiac: Pre-op EF:   EF 15% prior to surgery   Post op 30% with good valve function 23mm valve     1. Inotropes off   2. Pressors: off   3. Rhythm: NSR   AMIODARONE   4. Aspirin: holding    5. Beta-Blocker: carvedilol 6.25    6. Statin: atorvastatin    7. ACE/ARB: hold        Respiratory:    Smoker none   Pulmonary toilet    Xray: no issues  On levaquin still     Gastrointestinal:   GI bleeding suspected prior to surgery   Anemic and received 2 units PRBC in [place]   Had melena post op       GI CONSULT NEEDED WHEN STABLE     Genitourinary: creat: pre op 0.9   1.25>2.16>2.76>3.15>2.4>1.78  OFF CRRT 8/25 in AM    Infectious Disease:  WBC: 18>15>17    Hematology: HGB 7.1>7.2  PLTS 404 >461        hitt  ++++   Stopped all heparin and started  agatroban  Thrombocytopenia resolved     Endocrine: Glucose management   Electrolytes: replacing Mg/K/Ca  Prophylaxis Agatroban PPI/H2 Blocker          [employer]

## 2016-02-12 ENCOUNTER — Inpatient Hospital Stay: Admit: 2016-02-12 | Payer: MEDICAID

## 2016-02-12 LAB — CBC WITH AUTO DIFFERENTIAL
Bands %: 1 % (ref 0–10)
Bands, Absolute: 0.2 10*3/ÂµL (ref 0.0–1.2)
Basophils %: 1 % (ref 0–2)
Basophils, Absolute: 0.2 10*3/ÂµL (ref 0.0–0.2)
Eosinophils %: 0 % (ref 0–7)
Eosinophils, Absolute: 0 10*3/ÂµL (ref 0.0–0.7)
HCT: 21.9 % — ABNORMAL LOW (ref 37.0–48.0)
Hemoglobin: 6.5 g/dL — ABNORMAL LOW (ref 12.0–16.0)
Lymphocytes %: 9 % — ABNORMAL LOW (ref 25–45)
Lymphocytes, Absolute: 1.4 10*3/ÂµL (ref 1.1–4.3)
MCH: 22.4 pg — ABNORMAL LOW (ref 27.0–34.0)
MCHC: 29.7 g/dL — ABNORMAL LOW (ref 32.0–36.0)
MCV: 75.5 fL — ABNORMAL LOW (ref 81.0–99.0)
MPV: 7.3 fL — ABNORMAL LOW (ref 7.4–10.4)
Monocytes %: 2 % (ref 0–12)
Monocytes, Absolute: 0.3 10*3/ÂµL (ref 0.0–1.2)
Neutrophils %: 87 % — ABNORMAL HIGH (ref 35–70)
Neutrophils, Absolute: 13.7 10*3/ÂµL — ABNORMAL HIGH (ref 1.6–7.3)
Platelet Count: 425 10*3/ÂµL — ABNORMAL HIGH (ref 150–400)
Platelet Estimate: INCREASED — AB
RBC: 2.91 10*6/ÂµL — ABNORMAL LOW (ref 4.20–5.40)
RDW: 26.9 % — ABNORMAL HIGH (ref 11.5–14.5)
WBC: 15.8 10*3/ÂµL — ABNORMAL HIGH (ref 4.8–10.8)

## 2016-02-12 LAB — RENAL FUNCTION PANEL
Albumin: 2.2 g/dL — ABNORMAL LOW (ref 3.5–5.0)
Anion Gap: 5 mmol/L (ref 3.0–11.0)
BUN: 38 mg/dL — ABNORMAL HIGH (ref 6–23)
CO2 - Carbon Dioxide: 22 mmol/L (ref 21.0–31.0)
Calcium: 8.8 mg/dL (ref 8.6–10.3)
Chloride: 112 mmol/L — ABNORMAL HIGH (ref 98–111)
Creatinine: 1.49 mg/dL — ABNORMAL HIGH (ref 0.55–1.10)
GFR Estimate: 36 mL/min/{1.73_m2} — ABNORMAL LOW (ref >=60)
Glucose: 104 mg/dL — ABNORMAL HIGH (ref 80–99)
Phosphorus: 1.8 mg/dL — ABNORMAL LOW (ref 2.5–4.5)
Potassium: 3.5 mmol/L (ref 3.5–5.1)
Sodium: 139 mmol/L (ref 135–143)

## 2016-02-12 LAB — APTT: APTT: 60 Seconds — ABNORMAL HIGH (ref 23–36)

## 2016-02-12 LAB — PROTIME-INR
INR: 4.1 — ABNORMAL HIGH (ref 0.9–1.2)
Protime: 47 Seconds — ABNORMAL HIGH (ref 10.0–13.0)

## 2016-02-12 LAB — OCCULT BLOOD, STOOL DIAGNOSTIC: Occult Blood, Stool: POSITIVE — AB

## 2016-02-12 MED ORDER — warfarin (COUMADIN) tablet 2.5 mg
2.5 | Freq: Every day | ORAL | Status: DC
Start: 2016-02-12 — End: 2016-02-13
  Administered 2016-02-13: 03:00:00 2.5 mg via ORAL

## 2016-02-12 MED ORDER — furosemide (LASIX) tablet 20 mg
20 | Freq: Every day | ORAL | Status: DC
Start: 2016-02-12 — End: 2016-02-15
  Administered 2016-02-12 – 2016-02-14 (×3): 20 mg via ORAL

## 2016-02-12 MED FILL — ARGATROBAN 50 MG/50 ML (1 MG/ML) IN SODIUM CHLORIDE (ISO-OSMOTIC) IV: 50 mg/50 mL (1 mg/mL) | INTRAVENOUS | Qty: 50

## 2016-02-12 MED FILL — PROTONIX 40 MG INTRAVENOUS SOLUTION: 40 mg | INTRAVENOUS | Qty: 40

## 2016-02-12 MED FILL — AMIODARONE 200 MG TABLET: 200 mg | ORAL | Qty: 2

## 2016-02-12 MED FILL — CARVEDILOL 3.125 MG TABLET: 3.125 mg | ORAL | Qty: 2

## 2016-02-12 MED FILL — ATORVASTATIN 20 MG TABLET: 20 mg | ORAL | Qty: 1

## 2016-02-12 MED FILL — SODIUM FERRIC GLUCONATE COMPLEX IN SUCROSE 62.5 MG/5 ML INTRAVENOUS: 62.5 | INTRAVENOUS | Qty: 10

## 2016-02-12 MED FILL — COUMADIN 2.5 MG TABLET: 2.5 mg | ORAL | Qty: 1

## 2016-02-12 MED FILL — FUROSEMIDE 20 MG TABLET: 20 mg | ORAL | Qty: 1

## 2016-02-12 NOTE — Progress Notes (Signed)
Critical Care Progress Note:    Subjective: The patient is a 60 year non smoking female with BMI 40 and?progressive dyspnea since June.   ??  8/11 Admitted to [facility] ?with respiratory distress, pulmonary edema, NSTEMIC, anemia requiring 2 units PRBCs. ECHO showed severe aortic stenosis, EF 30%. Heart cath demonstrated no significant coronary occlusion. ?  ??  8/13 Transferred to [facility].   ??  8/17 Dr. Debroah Baller performed aortic valve replacement and right femoral IABP placement. Intraoperative EF was noted to be 15%. The patient was brought to the CCU post operatively. She required multiple vasopressors and inotropes and also received methylene blue. Nebulized flolan for pHTN.   ??  8/18 IABP was removed.   ??  8/19 Swan clotted and was removed. Levaquin started. Flolan stopped.   ??  8/20 Temp HD catheter was placed on and CRRT initiated. Bronchoscopy performed due to fever. Foley removed, new foley placed.   ??  8/21 Still?intubated. Milrinone 0.375 mcg/kg/min,?levophed at 10 mcg/min. CRRT. Due to ischemic appearing digits right radial aline removed, left radial aline placed. No purposeful response during sedation holiday. Enteral feeds started.   ??  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 [place] confirmed RLE DVT. Patient remained?intubated on levophed, milrinone with?CRRT running. She received IV amiodarone for afib. IV hydrocortisone started given persistence of circulatory shock (requiring 9 mcg/min levophed plus milrinone).  ??  8/23 Patient awakened with sedation holiday. Spontaneous breathing attempted, appeared weak for extubation. ?Milrinone has been weaned off, levophed is down to 3 mcg/min. ECHO: EF30-35%, AV prosthesis OK, moderate mitral regurgitation, evidence of RV pressure or volume overload. ?.   ??  8/24 Patient awakened, followed commands. Left IJ dialysis catheter stopped?functioning. LUE?PICC placed, right IJ TLC removed, ew right IJ temporary HD cath placed, left IJ temp HD cath removed.  ??  ??  8/25 Still appeared too weak for extubation.   ??  8/26 The patient passed some bloody stools.Extubation. ?Post extubation she required bipap alternating with heat high flow NC O2. Reported pain in her feet.   ??  8/27:?had good response to lasix. As urine output is improving, no plans for dialysis. Respiratory status appears improved. Failed SLP.  ??  8/28: Continues on amiodarone infusion with afib and vent rates of about 120bpm. Worked with PT this morning. 'Breathing fine'. Strong productive cough. Passed FEES study for pureed diet.  ??  8/29: she was weaned off supplemental oxygen.  Continued?to make good urine with improving creatinine.  ??  8/30: Underwent successful DC cardioversion.  ?  8/31: Diuresis continues - Cr even better this morning. Remains in sinus rhythm.  ?  9/1: uneventful - gradually improving strength and renal faunction.    Cr continues to improve and digital ischemia better.    Medications: Reviewed    Objective:    Physical Exam:  Vitals:    02/12/16 0945   BP:    Pulse: 87   Resp: (!) 28   Temp:    SpO2: 97%     Gen: ?60 y.o.yr old ?female?resting comfortably in her chair.  HEENT: Atraumatic, room air  Neck: Supple  Chest: sternotomy  Lungs: clear to ausculation  Cardiac: NSR  Abdm: Soft, non-tender  Ext: ischemic digits right hand/toes improving, pedal edema improving  Neuro: Awake, oriented, generalized weakness improving    Labs: reviewed    Imaging: reviewed personally and independently    Assessment:  ??  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  ??  Plan:    Neurologic: PT/OT. Analgesia as required.   ??  Pulmonary: Pulmonary  status has?improved?with diuresis. Room air.?  ??  Cardiac: Plan per CT surgery. Electrical cardioversion succeessful. Continue Amio, aspirin, statin, beta blocker.    ID: Stopped Levaquin. If she develops fever and/or progressive leukocytosis, blood cultures should be obtained. Monitor leukocytosis.    Renal: Removed Foley. HD catheter removed. Cr improving. Lasix started.  ??  GI: dysphagia minced diet?per SLP - strict aspiration precautions. Given her iron deficiency anemia on admission, blood per rectum during hospitalization, she will need colonoscopy at some point when more clinically stable. She will need GI consult and urgent endoscopy if her H/H drops more precipitously.   ??  Endocrine: Insulin sliding scale as needed to maintain euglycemia.   ??  Heme: H/H 6.5??Platelets 425. ?Argatroban for HIT with 5 days coumadin bridge. INR 4.1.  Famotidine switched to BID IV pantoprazole on 8/26 given GI bleeding.   ??  Code Status: full  ??  Critical care time spent: 30 mins  ?  Arnetra Terris Camillo Flaming

## 2016-02-12 NOTE — Progress Notes (Signed)
picc dressing and site wnl

## 2016-02-12 NOTE — Treatment Summary (Signed)
IP PT TREATMENT NOTE     [name] / 2007/2007-01 DOB:  1955/11/14 / 60 y.o. Date:  02/12/2016     Precautions  Specific mobility precautions: Sternal precautions  Sternal precautions: Standard  Required Braces/Orthotics: None     ASSESSMENT     Summary:  Pt improving daily, better stepping, but fatigues quickly, ? HR decreasing with gait training?       Activity tolerance: Tolerates 30 min activity     Precaution Awareness: Fully aware of precaution(s)  Deficit Awareness: Decreased awareness of deficits  Correction of Errors: Self-corrects with cues  Safety Judgment: Decreased awareness of need for assistance     Consult recommendations      Discharge recommendations  Mobility Equipment needs: To be determined later   Primary recommendation : Patient would benefit from intensive rehabilitation and is expected to be able to participate in at least 3 hrs of treatment daily  Recommended transport method: Wheelchair  Secondary recommendation: Patient would benefit from SNF/Nursing Home/Other Skilled Facility.  Recommended transport method: Wheelchair  Justification: far from previous baseline, medically complex, motivated      PLAN     Treatment Plan Frequency    Plan: Continue per established POC.  Pt. demonstrates limitations which require skilled PT intervention.  1x (5 days/week).  Frequency will be increased as pt's condition or discharge plan indicates.     Brandy [name] will remain on the Physical Therapy schedule until goals are met, or there has been a change in the Plan of Care.          Treatment Diagnosis Surgery / # Days Post-op (if applicable)    Primary Dx: Resp failure  Treatment Dx: altered mobility  Procedure(s) (LRB):  AORTIC VALVE REPLACEMENT; INTRA AORTIC BALLOON INSERTION (N/A) / 16 Days Post-Op       SUBJECTIVE        Pain Level  Current status: Pain somewhat limits pt's ability to participate in therapy  Pain at start of session: 3  Pain at end of session: 4  Pain location:  Foot  Pain descriptors: Sore     OBJECTIVE        Mental status  General Demeanor: Pleasant;Cooperative  LOC: Alert  Orientation: Oriented x 4  Directions: Follows one-step commands  --> Follows one-step commands: Consistently  Attention span: Appears intact  Memory: Appears intact in social/therapy situations   Medical appliances  Medical Appliances: IV;Telemetry;ECG;BP monitor;SPO2 monitor;HR monitor;Resp monitor   Vitals       Other         TREATMENT  Bed Mobility/Transfers  Bed mobility       Rolling / logroll         Transition to sitting up Supine to Sit: NA   To resting position      Transition to sitting down Min Assist     Transition to supine NA     Scooting NA   Transfers      Transition to standing From bed: NA  From chair: NA;Min Assist     Method Stand pivot: Min Assist      Gait/Stairs  Mobility - Gait (Level)  Gait (distance): 6 feet  Gait (assist level): Min Assist  Assistive device: Front wheeled walker (FWW)    Ther. Ex.  LE exercises: Ankle pumps;Heelslides;Quad sets    Balance  Balance - Basic  Static sitting balance: Assist level;Position  Assist level: Min assist  Position: Feet - supported   Training/Education  Trainee(s): Primary Learner  Primary Learner: Patient  Primary Learner - Barriers to Learning?: Yes  Primary Learner - Specific barriers: Physical  Training provided: sternal precautions  Response to training: receptive   Safety/Room Set-up   Call button accessible?: Yes  Phone accessible?: Yes  Oxygen reconnected?: No, on room air  Position on arrival: In regular chair  Position on departure (upright): In regular chair     GOALS     Patient/Caregiver goals reviewed and integrated with rehab treatment plan:     Multidisciplinary Problems (Active)        Problem: Mobility: Ambulation    Goal Priority Disciplines Outcome   Gait (with precautions)     PT    Description:  Pt. to ambulate 50 feet with one person min assist, using FWW, following any pertinent precautions.    Gait Soil scientist)      PT    Description:               Problem: Mobility: Transfers    Goal Priority Disciplines Outcome   Supine to/from sitting (with precautions)     PT    Description:  Pt. to transfer to/from sitting with one person min assist,  following any pertinent precautions.    Sitting to/from standing (with precautions)     PT    Description:  Pt. to transfer sitting to/from standing with one person min assist, following any pertinent precautions.    Bed to/from chair (with precautions)     PT    Description:  Pt. to transfer bed to/from chair with one person min assist, following any pertinent precautions.           Problem: Patient/Family Goal    Goal Priority Disciplines Outcome   Normal Status     PT    Description:  Pt. wants to return to previous level of function.     Mobility     PT    Description:  Patient wants to walk again.           Problem: Strength    Goal Priority Disciplines Outcome   Home Exercise Program (with precautions)     PT    Description:  Pt. to perform basic HEP independently following any pertinent precautions.                   G-Codes/PSFS/FIM Scores: (if applicable)          First session Second session (if applicable)    Start/Stop times: 1325 - 1400 Start/Stop times:   - Stop Time:     Total time: 35 minutes  Total time:   minutes     This note to serve as a Discharge Summary if Brandy [name] is discharged from the hospital or from therapy services.     Therapist:  Humphrey Rolls, PTA

## 2016-02-12 NOTE — Progress Notes (Signed)
PHARMACIST CRITICAL CARE PROGRESS NOTE     Consult Per P&T Protocol: Critical Care Physician Service, Antimicrobial Interventions, Renal Dosing, IV to PO    Physician Consult Request for Pharmacy:  Pharmacy to dose Warfarin and Argatroban for H.I.T. positive patient         Summary: Brandy [name] is [name] admitted on 01/23/2016 with a NSTEMI s/p R&L heart catheterization. PICC line placed 8/25. CRRT from 8/20-8/25. 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 GI bleed on 8/26, currently on PPI BID.    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  I explained why we were using the aforementioned drug therapy and how we were handling the monitoring of it.  She was happy to hear what we are doing because she wants to get better, move out the the floor and get back to normal quickly.  I mentioned that I too had a valve replacement and mentioned that she should feel better then she felt  before the surgery but, she should not expect to be back to normal for a few months.      Allergies: Heparin and Sulfa (sulfonamide antibiotics)    PMH:  has a past medical history of History of blood transfusion.     Surgical history:     Active problems:   ? Heparin induced thrombocytopenia  ? S/P Aortic valve replacement       OBJECTIVE:       Height: 167.6 cm (66) Admit weight: 101.4 kg (223 lb 8.7 oz) Last charted weight: 106 kg (233 lb 11 oz) IBW: 59.3 kg Body mass index is 37.72 kg/(m^2).    I/O last 3 completed shifts:  In: 1558 [P.O.:1360; I.V.:198]  Out: 3000 [Urine:3000]    Recent Labs      02/10/16   0550  02/10/16   1948  02/11/16   0549  02/12/16   0525   NA  141   --   139  139   K  3.5  3.4*  3.7  3.5   CL  107   --   108  112*   CO2  23.9   --   22.1  22.0   GLU  96   --   115*  104*   BUN  62*   --   47*  38*   CREATININES  1.96*   --   1.78*  1.49*   CALCIUM  9.7   --   9.4   8.8   ALBUMIN  2.5*   --   2.5*  2.2*     No results for input(s): CAION in the last 72 hours.  Recent Labs      02/09/16   1446  02/09/16   2325  02/10/16   0550  02/11/16   0549  02/12/16   0525   MG  1.9  2.0  1.8   --    --    PHOS   --    --   2.4*  1.8*  1.8*     Recent Labs      02/10/16   0032  02/10/16   0550  02/10/16   0757  02/10/16   1217  02/10/16   1605  02/10/16   1944   POCGLU  96  83  102*  162*  91  118*  Recent Labs      02/10/16   0550  02/11/16   0549  02/12/16   0525   WHITEBLOODCE  15.9*  17.0*  15.8*   NEUTROABS  14.3*  15.6*  13.7*   BANDSABS   --    --   0.2   HGB  7.0*  7.2*  6.5*   HCT  23.3*  23.5*  21.9*   LABPLAT  466*  461*  425*     Temp (24hrs), Avg:36.7 ?C (98 ?F), Min:36.4 ?C (97.5 ?F), Max:37 ?C (98.6 ?F)    Warfarin Dosing Record:  Date: 8/31 9/1 9/2   INR: 2.9 3 4.1   Warfarin: 3 mg 3 mg      ASSESSMENT:     Estimated CrCl: 42 mL/min (CG AdjBW) per [employer] [facility]  BUN/SCr Ratio: 26    CRRT 8/20-8/25    Critical Care Prophylaxis:  VAP: None, not intubated   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 Warfarin initiated 8/31   SUP: Protonix 40 mg IV BID  GI bleed 8/26     CBG Monitoring:   ? CBG goal in critically ill: 140-180  ? CBG in past 24H: 115-162    Argatroban infusion:  ? 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 Thrombosis in right lower extremity on 8/22  ? Argatroban at 0.8 mcg/kg/min   ? APTT = 64  ? Day 2/5 overlap with warfarin.  Day 1/2 of being in goal range    Recent vitamin K administration: None noted in Epic   Warfarin Indication:  Afib, thrombosis in RLE on 8/22   Goal INR: 2-3 (4-6 while on argatroban)   Today's INR: Subtherapeutic   Home warfarin dose: N/A   CHA2DS2-VASc score: 4 (Prior thromboembolism: 2; NSTEMI: 1; Female: 1)   Additional anticoagulant/antiplatelet:  Argatroban 0.8 mcg/kg/min   Comments about potential drug, disease or dietary interactions? Amiodarone, argatroban, acetaminophen,  and pantoprazole may increase risk of bleeding   Warfarin education status: Not given     PLAN:     Brandy [name] is able to verbalize an understanding of my care plan:    Critical Care Prophylaxis:  ? Prophylactic medications ordered    ? PLT > 150K  ? Argatroban continues (HIT positive with RLE DVT), warfarin overlap initiated on 8/31  ? Monitor for s/sx of bleeding    Renal Dosing:   ? No new renal dose adjustments at this time.   ? Pharmacy will continue to follow      IV to PO:   ? Per Intensivist note 8/29: strict aspiration precautions     Warfarin:  ? Argatroban had been briefly held for two hours this morning over concern of the elevated INR.  I asked the nurse to restart the argatroban and then alerted Brayton Caves (on call for Dr. Debroah Baller).  Dr. Dillard Cannon was present and agreed with my assessment that bridge therapy should be continued. Guidelines recommend 5 days of bridge therapy with 2 days of INR being in a range of 4-6.    ? Although Warfarin is just within range, there was a big jump from 3 to 4.1 after two days of therapy. There is the expected interaction issue between warfarin and the argatroban and a very big interaction between amiodarone and warfarin.  As such, I am going to decrease the warfarin dose to 2.5mg  Qday.  ? Pharmacy will continue to follow daily INR  Iona Beard, RPh., PharmD.

## 2016-02-12 NOTE — Progress Notes (Signed)
CARDIOTHORACIC SURGERY CRITICAL CARE NOTE     Name: Brandy [name]  DOB: 1955/11/15 60 y.o.  MRN: [id number]  CSN: [id number]    Brandy [name] is a 60 y.o. female patient. Patient was admitted with Respiratory failure (CMS/HCC)  Who had Procedure(s):  AORTIC VALVE REPLACEMENT; INTRA AORTIC BALLOON INSERTION.  16 Days Post-Op    HPI: The patient is a 60 y.o. female with the below medical co-morbid conditions.    ?  1. Severe symptomatic aortic stenosis                        Ejection fraction 30%     Found on TEE intra-op to be 15%                        Normal coronaries                        Predominant symptom dyspnea                     2. Severe Pulmonary Hypertension  3. Mild to Moderate Mitral regurgitation  4. Morbid obesity                        Weight loss 50 pounds in the last one year  5. respiratory insufficiency  6. Non-ST elevation MI                        The demand ischemia  7. Iron deficiency anemia                        CT chest abdomen and pelvis reviewed no abdominal masses                        Likely AVM secondary to aortic stenosis not a good candidate for sedation and EGD or colonoscopy well aortic stenosis is so critical  ?  Patient's a 60 year old female who presented the [facility] falls in respiratory distress unable to breathe.  There are her troponin was elevated she underwent oxygen therapy and was given Lasix.  Cardiac workup showed normal coronary arteries with depressed LV function on echo and severe aortic stenosis valve area less than 0.4 velocity 4.4 m/s with an EF 30%.  She was transfused 2 units of PRBCs A [facility] hospital with a hemoglobin 8.8.  His immobility volume overload from heart failure  ?  Patient hasn't been to the doctor in years she hasn't taken any medications nor does she take good care of herself.  [place] she is unable to consistently take meds.  She likes to use and asked to [employer] wood to heat her house.    ?  After a few  days of Lasix patient states that she was breathing much better she is 60% better per her suggestion.  I had a long talk with the family about the disease course why she was getting short of breath and discussion of heart failure.  They understand that she is given a need aortic valve replacement she wishes to have tissue valve I do think she is a better surgical candidate and have her given her age and risk.  ?  She has very poor dentition    POD #1  Patient has  been stable on balloon 1:2   Methylene blue last night SVR much better   Coronary perfusion good   Today I am managing acute heart failure with management of the IABP.    POD #2  removal of the intra-aortic balloon pump renal ultrasound completed showing minimal flow to the kidneys  patient still making urine  POD #3 CRRT started  POD #4 CRRT running acid base more stable pressures much better off NEO and LEVOPHED improved making urine  POD#5 CRRT continues   Patient plt 25   Will check for DVT   Postoperative day #6 CRRT continues platelets now up to 32 treating for heparin-induced from cytopenia  Postoperative day #7 Trialysis Cath replaced secondary to malfunction.  On agatroban still hemodynamics improving oxygen requirements lower.  Postoperative day #8 making transition to hemodialysis   Postoperative day #9 +12 liters still  Will need HD likely     POD #10:  11.8 L positive. Planning hemodialysis today.  remains in A. Fib on the amiodarone protocol.  POD#11: 8 liters positive.  No other changes  POD#12: still in AFIB rates 110-130   Multiple amiodarone loads without success...digoxin not good option secondary to renal failure  POD#13 doing well  Cardioversion by Dr. Truddie Hidden  POD#14 no major issues   In NSR  POD#15 NSR doing well needs rehab   POD#16 NSR looked good with PT yesterday getting stronger ischemic toes feel much better     Subjective: making progress    Assessment & Plan:     Active Hospital Problems    Diagnosis SNOMED CT(R) Date Noted   .  Heparin induced thrombocytopenia (CMS/HCC) HEPARIN-INDUCED THROMBOCYTOPENIA 02/02/2016   . S/P AVR (aortic valve replacement) HISTORY OF AORTIC VALVE REPLACEMENT 01/27/2016   . Respiratory failure (CMS/HCC) RESPIRATORY FAILURE 01/23/2016   . Aortic stenosis, severe AORTIC VALVE STENOSIS 01/22/2016   . NSTEMI (non-ST elevated myocardial infarction) (CMS/HCC) ACUTE NON-ST SEGMENT ELEVATION MYOCARDIAL INFARCTION 01/21/2016   . Demand ischemia of myocardium (CMS/HCC) ACUTE ISCHEMIC HEART DISEASE 01/21/2016   . Iron deficiency anemia IRON DEFICIENCY ANEMIA 01/21/2016   . Obesity OBESITY 01/21/2016       Scheduled Medications:   . albuterol  2.5 mg Nebulization 4x daily   . amiodarone  400 mg Oral 2 times per day   . atorvastatin  20 mg Oral Nightly   . carvedilol  6.25 mg Oral BID with meals   . ferric gluconate (FERRLECIT) IVPB  125 mg Intravenous Daily   . nystatin   Topical 2 times per day   . pantoprazole  40 mg Intravenous 2 times per day   . sodium chloride 0.9 % (NS) syringe  10 mL Intravenous Q8H SCH   . sodium chloride 0.9 % (NS) syringe  5-10 mL Intravenous Daily   . warfarin  3 mg Oral Daily     Infusions:   . amiodarone in D5W Stopped (02/11/16 0900)   . argatroban Stopped (02/12/16 0981)   . clevidipine Stopped (02/02/16 0800)   . dexmedetomidine Stopped (02/05/16 1402)   . dextrose 10% (D10W)     . dextrose 5% lactated ringers Stopped (01/29/16 1829)   . DOBUTamine (DOBUTREX) 250 mg in D5W 250 mL (1000 mcg/mL) infusion     . milrinone Stopped (02/02/16 0736)   . nitroglycerin     . norepinephrine Stopped (02/05/16 1203)   . phenylephrine (NEO-SYNEPHRINE) 40 mg in NaCl 0.9 % 500 mL (80 mcg/mL) infusion **DOUBLE CONCENTRATION** Stopped (01/31/16 0515)   .  vasopressin (PITRESSIN) 50 units in NaCl 0.9 % 250 mL (0.2 units/mL) infusion **ADULT SHOCK** Stopped (01/28/16 1711)     Recent Labs:  Lab Results   Component Value Date    NA 139 02/12/2016    K 3.5 02/12/2016    K 3.69 01/23/2016    CL 112 (H) 02/12/2016      CL 104.1 01/23/2016    CO2 22.0 02/12/2016    CO2 20 (L) 01/23/2016    GLU 104 (H) 02/12/2016    GLU 85 01/23/2016    BUN 38 (H) 02/12/2016    CREATININES 1.49 (H) 02/12/2016    CALCIUM 8.8 02/12/2016    AST 26 02/04/2016    ALT 10 02/04/2016    ALKPHOS 121 (H) 02/04/2016    BILITOT 0.90 01/21/2016    ALBUMIN 2.2 (L) 02/12/2016    GLOB 3.0 02/04/2016    AGRATIO 0.9 (L) 02/04/2016    ANIONGAP 5.0 02/12/2016    ANIONGAP 21.0 (H) 01/23/2016    LABGLOM 36 (L) 02/12/2016    LABGLOM >60.0 01/23/2016    WHITEBLOODCE 15.8 (H) 02/12/2016    LABPLAT 425 (H) 02/12/2016    HGB 6.5 (LCrit) 02/12/2016    PROTIME 47.0 (H) 02/12/2016    INR 4.1 (H) 02/12/2016    APTT 60 (H) 02/12/2016          Vital Signs:      ? Cardiac output   ? Cardiac Index   Temp: 36.6 ?C (97.9 ?F) BP: 110/72 Pulse: 91 Resp: 24 SpO2: 96 % on O2 Flow Rate (L/min): 2 L/min None (Room air)   Intake/Output:     CT OUTPUT: removed     URINE OUTPUT: 2100  +6 liters    Exam:  Neuro: movine all extremities equal strength  Cardiac: NSR no murmurs  Lungs;clear  ABDOMEN:abdomen is soft without significant tenderness, masses, organomegaly or guarding  EXTREMITIES 2+ edema in the lower extremities toes appear much better digital ischemia much improved    PLAN:   Neurologic: No issues      Cardiac: Pre-op EF:   EF 15% prior to surgery   Post op 30% with good valve function 23mm valve     1. Inotropes off   2. Pressors: off   3. Rhythm: NSR   AMIODARONE   4. Aspirin: holding    5. Beta-Blocker: carvedilol 6.25    6. Statin: atorvastatin    7. ACE/ARB: hold        Respiratory:    Smoker none   Pulmonary toilet    Xray: wet   ABX stopped     Gastrointestinal:   GI bleeding suspected prior to surgery   Anemic and received 2 units PRBC in [place]   Had melena post op       GI CONSULT NEEDED WHEN STABLE     Genitourinary: creat: pre op 0.9   1.25>2.16>2.76>3.15>2.4>1.78>1.49 starting lasix once per day   OFF CRRT 8/25 in AM    Infectious Disease:  WBC:  18>15>17>15    Hematology: HGB 7.1>7.2>6.5  PLTS 404 >461>425       hitt  ++++    started agatroban   Thrombocytopenia resolved on coumadin INR 4.1 today     Endocrine: Glucose management   Electrolytes: replacing Mg/K/Ca  Prophylaxis Argatroban PPI/H2 Blocker      SUMMARY    INR now 4.1 discuss with pharmacy if they are ready to stop argatroban  Severe anemia hold on blood today unless she becomes  symptomatic   Lasix once per day +6 liters kidney function improving      [employer]

## 2016-02-13 ENCOUNTER — Inpatient Hospital Stay: Admit: 2016-02-13 | Payer: MEDICAID

## 2016-02-13 LAB — CBC WITH AUTO DIFFERENTIAL
HCT: 22.1 % — ABNORMAL LOW (ref 37.0–48.0)
Hemoglobin: 6.7 g/dL — ABNORMAL LOW (ref 12.0–16.0)
MCH: 23 pg — ABNORMAL LOW (ref 27.0–34.0)
MCHC: 30.2 g/dL — ABNORMAL LOW (ref 32.0–36.0)
MCV: 76 fL — ABNORMAL LOW (ref 81.0–99.0)
MPV: 7.6 fL (ref 7.4–10.4)
Platelet Count: 398 10*3/ÂµL (ref 150–400)
RBC: 2.91 10*6/ÂµL — ABNORMAL LOW (ref 4.20–5.40)
RDW: 27.7 % — ABNORMAL HIGH (ref 11.5–14.5)
WBC: 15.3 10*3/ÂµL — ABNORMAL HIGH (ref 4.8–10.8)

## 2016-02-13 LAB — RENAL FUNCTION PANEL
Albumin: 2.3 g/dL — ABNORMAL LOW (ref 3.5–5.0)
Anion Gap: 7.5 mmol/L (ref 3.0–11.0)
BUN: 30 mg/dL — ABNORMAL HIGH (ref 6–23)
CO2 - Carbon Dioxide: 22.5 mmol/L (ref 21.0–31.0)
Calcium: 8.7 mg/dL (ref 8.6–10.3)
Chloride: 112 mmol/L — ABNORMAL HIGH (ref 98–111)
Creatinine: 1.2 mg/dL — ABNORMAL HIGH (ref 0.55–1.10)
GFR Estimate: 46 mL/min/{1.73_m2} — ABNORMAL LOW (ref >=60)
Glucose: 101 mg/dL — ABNORMAL HIGH (ref 80–99)
Phosphorus: 1.9 mg/dL — ABNORMAL LOW (ref 2.5–4.5)
Potassium: 3.4 mmol/L — ABNORMAL LOW (ref 3.5–5.1)
Sodium: 142 mmol/L (ref 135–143)

## 2016-02-13 LAB — PROTIME-INR
INR: 2.8 — ABNORMAL HIGH (ref 0.9–1.2)
Protime: 32 Seconds — ABNORMAL HIGH (ref 10.0–13.0)

## 2016-02-13 LAB — APTT: APTT: 58 Seconds — ABNORMAL HIGH (ref 23–36)

## 2016-02-13 MED ORDER — warfarin (COUMADIN) tablet 3 mg
3 | Freq: Every day | ORAL | Status: DC
Start: 2016-02-13 — End: 2016-02-14
  Administered 2016-02-14: 3 mg via ORAL

## 2016-02-13 MED ORDER — famotidine (PEPCID) tablet 20 mg
20 | Freq: Two times a day (BID) | ORAL | Status: DC
Start: 2016-02-13 — End: 2016-02-17
  Administered 2016-02-13 – 2016-02-17 (×10): 20 mg via ORAL

## 2016-02-13 MED ORDER — ondansetron (ZOFRAN) 4 mg/2 mL injection
4 | INTRAMUSCULAR | Status: DC
Start: 2016-02-13 — End: 2016-02-17

## 2016-02-13 MED FILL — ARGATROBAN 50 MG/50 ML (1 MG/ML) IN SODIUM CHLORIDE (ISO-OSMOTIC) IV: 50 mg/50 mL (1 mg/mL) | INTRAVENOUS | Qty: 50

## 2016-02-13 MED FILL — ONDANSETRON HCL (PF) 4 MG/2 ML INJECTION SOLUTION: 4 | INTRAMUSCULAR | Qty: 2

## 2016-02-13 MED FILL — CARVEDILOL 3.125 MG TABLET: 3.125 mg | ORAL | Qty: 2

## 2016-02-13 MED FILL — ATORVASTATIN 20 MG TABLET: 20 mg | ORAL | Qty: 1

## 2016-02-13 MED FILL — FAMOTIDINE 20 MG TABLET: 20 mg | ORAL | Qty: 1

## 2016-02-13 MED FILL — COUMADIN 3 MG TABLET: 3 mg | ORAL | Qty: 1

## 2016-02-13 MED FILL — AMIODARONE 200 MG TABLET: 200 mg | ORAL | Qty: 2

## 2016-02-13 MED FILL — NYSTOP 100,000 UNIT/GRAM TOPICAL POWDER: 100000 [IU]/g | TOPICAL | Qty: 15

## 2016-02-13 MED FILL — FUROSEMIDE 20 MG TABLET: 20 mg | ORAL | Qty: 1

## 2016-02-13 MED FILL — SODIUM FERRIC GLUCONATE COMPLEX IN SUCROSE 62.5 MG/5 ML INTRAVENOUS: 62.5 | INTRAVENOUS | Qty: 10

## 2016-02-13 NOTE — Treatment Summary (Signed)
IP PT TREATMENT NOTE     [name] / [birth year]01 DOB:  1955-11-12 / 60 y.o. Date:  02/13/2016     Precautions  Specific mobility precautions: Sternal precautions  Sternal precautions: Standard  Required Braces/Orthotics: None     ASSESSMENT     Summary:  Pt improving daily, motivated.  Still quite weak, Vitals stable HR 115 during ambulation.     Activity tolerance: Tolerates 30 min activity     Precaution Awareness: Fully aware of precaution(s)  Deficit Awareness: Decreased awareness of deficits  Correction of Errors: Self-corrects with cues  Safety Judgment: Good awareness of safety     Consult recommendations      Discharge recommendations  Mobility Equipment needs: To be determined later   Primary recommendation : Patient would benefit from intensive rehabilitation and is expected to be able to participate in at least 3 hrs of treatment daily  Recommended transport method: Wheelchair  Secondary recommendation: Patient would benefit from SNF/Nursing Home/Other Skilled Facility.  Recommended transport method: Wheelchair  Justification: far from previous baseline, medically complex, motivated      PLAN     Treatment Plan Frequency    Plan: Continue per established POC.  Pt. demonstrates limitations which require skilled PT intervention.  1x (5 days/week).  Frequency will be increased as pt's condition or discharge plan indicates.     Brandy [name] will remain on the Physical Therapy schedule until goals are met, or there has been a change in the Plan of Care.          Treatment Diagnosis Surgery / # Days Post-op (if applicable)    Primary Dx: Resp failure  Treatment Dx: altered mobility  Procedure(s) (LRB):  AORTIC VALVE REPLACEMENT; INTRA AORTIC BALLOON INSERTION (N/A) / 17 Days Post-Op       SUBJECTIVE        Pain Level  Current status: Pain somewhat limits pt's ability to participate in therapy  Pain at start of session: 4  Pain at end of session: 5  Pain location: Foot  Pain descriptors: Sore      OBJECTIVE        Mental status  General Demeanor: Pleasant;Cooperative  LOC: Alert  Orientation: Oriented x 4  Directions: Follows one-step commands  --> Follows one-step commands: Consistently  Attention span: Appears intact  Memory: Appears intact in social/therapy situations   Medical appliances  Medical Appliances: IV;Telemetry;ECG;BP monitor;SPO2 monitor;HR monitor;Resp monitor   Vitals       Other         TREATMENT  Bed Mobility/Transfers  Bed mobility       Rolling / logroll  Rolling R: Min Assist      Transition to sitting up Supine to Sit: Min Assist   To resting position      Transition to sitting down Min Assist     Transition to supine Mod Assist     Scooting Min Assist   Transfers      Transition to standing From bed: Min Assist  From chair: Min Assist     Method Stand pivot: Min Assist;Tech required for balance/safety      Gait/Stairs  Mobility - Gait (Level)  Gait (distance): 8 feet  Gait (assist level): Min Assist  Assistive device: Front wheeled walker (FWW)    Ther. Ex.  LE exercises: Ankle pumps;Heelslides;Quad sets  Addit. Exercises: trunk ext/abd sets, deep breathing    Balance  Balance - Basic  Static sitting balance: Assist level;Position  Assist level: Min assist  Position: Feet - supported   Training/Education  Trainee(s): Primary Learner  Primary Learner: Patient  Primary Learner - Barriers to Learning?: Yes  Primary Learner - Specific barriers: Physical  Training provided: sternal precautions  Response to training: receptive   Safety/Room Set-up   Call button accessible?: Yes  Phone accessible?: Yes  Oxygen reconnected?: No, on room air  Position on arrival: In regular chair  Position on departure (upright): In regular chair     GOALS     Patient/Caregiver goals reviewed and integrated with rehab treatment plan:     Multidisciplinary Problems (Active)        Problem: Mobility: Ambulation    Goal Priority Disciplines Outcome   Gait (with precautions)     PT    Description:  Pt. to  ambulate 50 feet with one person min assist, using FWW, following any pertinent precautions.    Gait Soil scientist)     PT    Description:               Problem: Mobility: Transfers    Goal Priority Disciplines Outcome   Supine to/from sitting (with precautions)     PT    Description:  Pt. to transfer to/from sitting with one person min assist,  following any pertinent precautions.    Sitting to/from standing (with precautions)     PT    Description:  Pt. to transfer sitting to/from standing with one person min assist, following any pertinent precautions.    Bed to/from chair (with precautions)     PT    Description:  Pt. to transfer bed to/from chair with one person min assist, following any pertinent precautions.           Problem: Patient/Family Goal    Goal Priority Disciplines Outcome   Normal Status     PT    Description:  Pt. wants to return to previous level of function.     Mobility     PT    Description:  Patient wants to walk again.           Problem: Strength    Goal Priority Disciplines Outcome   Home Exercise Program (with precautions)     PT    Description:  Pt. to perform basic HEP independently following any pertinent precautions.                   G-Codes/PSFS/FIM Scores: (if applicable)          First session Second session (if applicable)    Start/Stop times: 0850 - 0920 Start/Stop times:   - Stop Time:     Total time: 30 minutes  Total time:   minutes     This note to serve as a Discharge Summary if Kyran Whittier is discharged from the hospital or from therapy services.     Therapist:  Humphrey Rolls, PTA

## 2016-02-13 NOTE — Progress Notes (Signed)
PHARMACIST CRITICAL CARE PROGRESS NOTE     Consult Per P&T Protocol: Critical Care Physician Service, Antimicrobial Interventions, Renal Dosing, IV to PO    Physician Consult Request for Pharmacy:  Pharmacy to dose Warfarin and Argatroban for H.I.T. positive patient         Summary: Brandy [name] is [name] admitted on 01/23/2016 with a NSTEMI s/p R&L heart catheterization. PICC line placed 8/25. CRRT from 8/20-8/25. 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 GI bleed on 8/26, currently on PPI BID.    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  I reviewed with her the current status of her labs and how they relate to her anticoagulation regimen today.    Allergies: Heparin and Sulfa (sulfonamide antibiotics)    PMH:  has a past medical history of History of blood transfusion.     Surgical history:     Active problems:   ? Heparin induced thrombocytopenia  ? S/P Aortic valve replacement       OBJECTIVE:       Height: 167.6 cm (66) Admit weight: 101.4 kg (223 lb 8.7 oz) Last charted weight: 108 kg (238 lb 1.6 oz) IBW: 59.3 kg Body mass index is 38.43 kg/(m^2).    I/O last 3 completed shifts:  In: 2025 [P.O.:1840; I.V.:185]  Out: 3000 [Urine:3000]    Recent Labs      02/11/16   0549  02/12/16   0525  02/13/16   0448   NA  139  139  142   K  3.7  3.5  3.4*   CL  108  112*  112*   CO2  22.1  22.0  22.5   GLU  115*  104*  101*   BUN  47*  38*  30*   CREATININES  1.78*  1.49*  1.20*   CALCIUM  9.4  8.8  8.7   ALBUMIN  2.5*  2.2*  2.3*     No results for input(s): CAION in the last 72 hours.  Recent Labs      02/11/16   0549  02/12/16   0525  02/13/16   0448   PHOS  1.8*  1.8*  1.9*     Recent Labs      02/10/16   0757  02/10/16   1217  02/10/16   1605  02/10/16   1944   POCGLU  102*  162*  91  118*     Recent Labs      02/11/16   0549  02/12/16   0525  02/13/16   0448      WHITEBLOODCE  17.0*  15.8*  15.3*   NEUTROABS  15.6*  13.7*   --    BANDSABS   --   0.2   --    HGB  7.2*  6.5*  6.7*   HCT  23.5*  21.9*  22.1*   LABPLAT  461*  425*  398     Temp (24hrs), Avg:37 ?C (98.6 ?F), Min:36.6 ?C (97.9 ?F), Max:37.4 ?C (99.3 ?F)    Warfarin Dosing Record:  Date: 8/31 9/1 9/2 9.3   INR: 2.9 3 4.1 2.8   Warfarin: 3 mg 3 mg 2.5      ASSESSMENT:     Estimated CrCl: 62 mL/min (CG AdjBW) per [employer] [facility]  BUN/SCr Ratio: 30/1.2=25    CRRT  8/20-8/25    Critical Care Prophylaxis:  VAP: None, not intubated   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 Warfarin bridge initiated 8/31   SUP: Protonix 40 mg IV BID     PLATE=398  GI bleed 8/26     CBG Monitoring:   ? CBG goal in critically ill: 140-180  ? CBG <180    Argatroban infusion:  ? 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 Thrombosis in right lower extremity on 8/22  ? Argatroban at 0.8 mcg/kg/min   ? APTT = 58  ? Day 4/5 overlap with warfarin.  INR fell below target goal this morning (9/3).  Need 2 consecutive days at goal before attempting to DC the Argatroban    Recent vitamin K administration: None noted in Epic   Warfarin Indication:  Afib, thrombosis in RLE on 8/22   Goal INR: 2-3 (4-6 while on argatroban)   Today's INR: Subtherapeutic   Home warfarin dose: N/A   CHA2DS2-VASc score: 4 (Prior thromboembolism: 2; NSTEMI: 1; Female: 1)   Additional anticoagulant/antiplatelet:  Argatroban 0.8 mcg/kg/min   Comments about potential drug, disease or dietary interactions? Amiodarone, argatroban, acetaminophen, and pantoprazole may increase risk of bleeding   Warfarin education status: Not given     PLAN:     Brandy [name] is able to verbalize an understanding of my care plan:    Critical Care Prophylaxis:  ? Prophylactic medications ordered    ? PLT > 150K  ? Argatroban continues (HIT positive with RLE DVT), warfarin overlap initiated on 8/31  ? Monitor for s/sx of bleeding    Renal Dosing:    ? No new renal dose adjustments at this time.   ? Pharmacy will continue to follow      IV to PO:   ? Per Intensivist note 8/29: strict aspiration precautions     Warfarin:  9/2:  ? Argatroban had been briefly held for two hours this morning over concern of the elevated INR.  I asked the nurse to restart the argatroban and then alerted Brayton Caves (on call for Dr. Debroah Baller).  Dr. Dillard Cannon was present and agreed with my assessment that bridge therapy should be continued. Guidelines recommend 5 days of bridge therapy with 2 days of INR being in a range of 4-6.    ? Although Warfarin is just within range, there was a big jump from 3 to 4.1 after two days of therapy. There is the expected interaction issue between warfarin and the argatroban and a very big interaction between amiodarone and warfarin.  As such, I am going to decrease the warfarin dose to 2.5mg  Qday.    9/3:  ? Day 4/5 overlap with warfarin.  INR fell below target goal this morning (9/3). Yesterday, I had been expecting that the INR would continue to climb  Need 2 consecutive days at goal before attempting to DC the Argatroban (see protocol for guidelines).  ? Amiodarone 400mg  BID continues but Levaquin DC'd yesterday.  ? I have increased the Warfarin back to the 3mg  Qday regimen.  ? Pharmacy will continue to follow daily INR    Iona Beard, RPh., PharmD.

## 2016-02-13 NOTE — Progress Notes (Signed)
Critical Care Progress Note:    Subjective: The patient is a 60 year non smoking female with BMI 40 and?progressive dyspnea since June.   ??  8/11 Admitted to [facility] ?with respiratory distress, pulmonary edema, NSTEMIC, anemia requiring 2 units PRBCs. ECHO showed severe aortic stenosis, EF 30%. Heart cath demonstrated no significant coronary occlusion. ?  ??  8/13 Transferred to [facility].   ??  8/17 Dr. Debroah Baller performed aortic valve replacement and right femoral IABP placement. Intraoperative EF was noted to be 15%. The patient was brought to the CCU post operatively. She required multiple vasopressors and inotropes and also received methylene blue. Nebulized flolan for pHTN.   ??  8/18 IABP was removed.   ??  8/19 Swan clotted and was removed. Levaquin started. Flolan stopped.   ??  8/20 Temp HD catheter was placed on and CRRT initiated. Bronchoscopy performed due to fever. Foley removed, new foley placed.   ??  8/21 Still?intubated. Milrinone 0.375 mcg/kg/min,?levophed at 10 mcg/min. CRRT. Due to ischemic appearing digits right radial aline removed, left radial aline placed. No purposeful response during sedation holiday. Enteral feeds started.   ??  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 [place] confirmed RLE DVT. Patient remained?intubated on levophed, milrinone with?CRRT running. She received IV amiodarone for afib. IV hydrocortisone started given persistence of circulatory shock (requiring 9 mcg/min levophed plus milrinone).  ??  8/23 Patient awakened with sedation holiday. Spontaneous breathing attempted, appeared weak for extubation. ?Milrinone has been weaned off, levophed is down to 3 mcg/min. ECHO: EF30-35%, AV prosthesis OK, moderate mitral regurgitation, evidence of RV pressure or volume overload. ?.   ??  8/24 Patient awakened, followed commands. Left IJ dialysis catheter stopped?functioning. LUE?PICC placed, right IJ TLC removed, ew right IJ temporary HD cath placed, left IJ temp HD cath removed.  ??  ??  8/25 Still appeared too weak for extubation.   ??  8/26 The patient passed some bloody stools.Extubation. ?Post extubation she required bipap alternating with heat high flow NC O2. Reported pain in her feet.   ??  8/27:?had good response to lasix. As urine output is improving, no plans for dialysis. Respiratory status appears improved. Failed SLP.  ??  8/28: Continues on amiodarone infusion with afib and vent rates of about 120bpm. Worked with PT this morning. 'Breathing fine'. Strong productive cough. Passed FEES study for pureed diet.  ??  8/29: she was weaned off supplemental oxygen.  Continued?to make good urine with improving creatinine.  ??  8/30: Underwent successful DC cardioversion.  ??  8/31: Diuresis continues - Cr even better this morning. Remains in sinus rhythm.  ??  9/1: uneventful - gradually improving strength and renal faunction.  ?  9/2: Cr continues to improve and digital ischemia better.    Worked with PT this morning - motivated. Hb drifted to 6.7. Renal recovery continues.    Medications: Reviewed    Objective:    Physical Exam:  Vitals:    02/13/16 0900   BP:    Pulse: 98   Resp: (!) 32   Temp:    SpO2: 97%     Unchanged exam.  Gen: ?60 y.o.yr old ?female?resting comfortably in her chair.  HEENT: Atraumatic, room air  Neck: Supple  Chest: sternotomy  Lungs: clear to ausculation  Cardiac: NSR  Abdm: Soft, non-tender  Ext: ischemic digits right hand/toes improving, pedal edema improving  Neuro: Awake, oriented, generalized weakness improving    Labs: reviewed    Imaging: reviewed personally and independently  Assessment:    Bioprosthetic aortic valve replacement 8/17   ECHO 8/23 EF 30-35%, moderate mitral regurgitation, AV prosthesis OK  Acute hypoxemic respiratory failure  Afib with RVR  Acute kidney injury with circulatory overload  HIT with?proximal RLE DVT  Ischemic digits  Iron deficiency anemia on admission requiring transfusion, suspected occult GI blood loss  Hematochezia on  8/26  Weakness/deconditioning?  ??  Plan:    Neurologic: PT/OT. Analgesia as required.   ??  Pulmonary: Pulmonary status has?improved?with diuresis. Room air.?  ??  Cardiac: Plan per CT surgery. Electrical cardioversion succeessful. Continue Amio, aspirin, statin, beta blocker.    ID: Stopped?Levaquin. If she develops fever and/or progressive leukocytosis, blood cultures should be obtained. Monitor leukocytosis.    Renal: Removed?Foley. HD catheter removed. Cr improving. Lasix started with good response..  ??  GI: dysphagia minced diet?per SLP - strict aspiration precautions. Given her iron deficiency anemia on admission, blood per rectum during hospitalization, she will need colonoscopy at some point when more clinically stable. She will need GI consult and urgent endoscopy if her H/H drops more precipitously.   ??  Endocrine: Insulin sliding scale as needed to maintain euglycemia.   ??  Heme: H/H 6.7??Platelets 425. ?Argatroban for HIT with 5 days coumadin bridge. INR 2.8.  Famotidine switched to BID IV pantoprazole on 8/26 given GI bleeding.   ??  Code Status: full  ??  Will sign off. Please call as needed.    Critical care time spent: 30 mins    Sanjuanita Condrey Camillo Flaming

## 2016-02-13 NOTE — Spiritual Care Consult (Signed)
Spiritual Assessment  02/13/2016    Brandy [name] is a 60 y.o. year-old female who was admitted on 01/23/2016    Spiritual Assessment  Attempt / Visit #: Second attempt (Pt Sound asleep)  Ministry To:: Patient  Patient Referred By: Rounds  Follow up: Will follow up as requested    Chaplain note:

## 2016-02-13 NOTE — Progress Notes (Signed)
CARDIOTHORACIC SURGERY CRITICAL CARE NOTE     Name: Brandy [name]  DOB: 12-Jan-1956 60 y.o.  MRN: [id number]  CSN: [id number]    Brandy [name] is a 60 y.o. female patient. Patient was admitted with Respiratory failure (CMS/HCC)  Who had Procedure(s):  AORTIC VALVE REPLACEMENT; INTRA AORTIC BALLOON INSERTION.  17 Days Post-Op    HPI: The patient is a 60 y.o. female with the below medical co-morbid conditions.    ?  1. Severe symptomatic aortic stenosis                        Ejection fraction 30%     Found on TEE intra-op to be 15%                        Normal coronaries                        Predominant symptom dyspnea                     2. Severe Pulmonary Hypertension  3. Mild to Moderate Mitral regurgitation  4. Morbid obesity                        Weight loss 50 pounds in the last one year  5. respiratory insufficiency  6. Non-ST elevation MI                        The demand ischemia  7. Iron deficiency anemia                        CT chest abdomen and pelvis reviewed no abdominal masses                        Likely AVM secondary to aortic stenosis not a good candidate for sedation and EGD or colonoscopy well aortic stenosis is so critical  ?  Patient's a 60 year old female who presented the [facility] falls in respiratory distress unable to breathe.  There are her troponin was elevated she underwent oxygen therapy and was given Lasix.  Cardiac workup showed normal coronary arteries with depressed LV function on echo and severe aortic stenosis valve area less than 0.4 velocity 4.4 m/s with an EF 30%.  She was transfused 2 units of PRBCs [facility] hospital with a hemoglobin 8.8.  His immobility volume overload from heart failure  ?  Patient hasn't been to the doctor in years she hasn't taken any medications nor does she take good care of herself.  [place] she is unable to consistently take meds.  She likes to use and asked to [employer] wood to heat her house.    ?  After a few  days of Lasix patient states that she was breathing much better she is 60% better per her suggestion.  I had a long talk with the family about the disease course why she was getting short of breath and discussion of heart failure.  They understand that she is given a need aortic valve replacement she wishes to have tissue valve I do think she is a better surgical candidate and have her given her age and risk.  ?  She has very poor dentition    POD #1  Patient has  been stable on balloon 1:2   Methylene blue last night SVR much better   Coronary perfusion good   Today I am managing acute heart failure with management of the IABP.    POD #2  removal of the intra-aortic balloon pump renal ultrasound completed showing minimal flow to the kidneys  patient still making urine  POD #3 CRRT started  POD #4 CRRT running acid base more stable pressures much better off NEO and LEVOPHED improved making urine  POD#5 CRRT continues   Patient plt 25   Will check for DVT   Postoperative day #6 CRRT continues platelets now up to 32 treating for heparin-induced from cytopenia  Postoperative day #7 Trialysis Cath replaced secondary to malfunction.  On agatroban still hemodynamics improving oxygen requirements lower.  Postoperative day #8 making transition to hemodialysis   Postoperative day #9 +12 liters still  Will need HD likely     POD #10:  11.8 L positive. Planning hemodialysis today.  remains in A. Fib on the amiodarone protocol.  POD#11: 8 liters positive.  No other changes  POD#12: still in AFIB rates 110-130   Multiple amiodarone loads without success...digoxin not good option secondary to renal failure  POD#13 doing well  Cardioversion by Dr. Truddie Hidden  POD#14 no major issues   In NSR  POD#15 NSR doing well needs rehab   POD#16 NSR looked good with PT yesterday getting stronger ischemic toes feel much better   POD#17 NSR continues on argatroban INR 2.8    Subjective: making progress    Assessment & Plan:     Active Hospital Problems     Diagnosis SNOMED CT(R) Date Noted   . Heparin induced thrombocytopenia (CMS/HCC) HEPARIN-INDUCED THROMBOCYTOPENIA 02/02/2016   . S/P AVR (aortic valve replacement) HISTORY OF AORTIC VALVE REPLACEMENT 01/27/2016   . Respiratory failure (CMS/HCC) RESPIRATORY FAILURE 01/23/2016   . Aortic stenosis, severe AORTIC VALVE STENOSIS 01/22/2016   . NSTEMI (non-ST elevated myocardial infarction) (CMS/HCC) ACUTE NON-ST SEGMENT ELEVATION MYOCARDIAL INFARCTION 01/21/2016   . Demand ischemia of myocardium (CMS/HCC) ACUTE ISCHEMIC HEART DISEASE 01/21/2016   . Iron deficiency anemia IRON DEFICIENCY ANEMIA 01/21/2016   . Obesity OBESITY 01/21/2016       Scheduled Medications:   . albuterol  2.5 mg Nebulization 4x daily   . amiodarone  400 mg Oral 2 times per day   . atorvastatin  20 mg Oral Nightly   . carvedilol  6.25 mg Oral BID with meals   . famotidine  20 mg Oral 2 times per day   . ferric gluconate (FERRLECIT) IVPB  125 mg Intravenous Daily   . furosemide  20 mg Oral Daily   . nystatin   Topical 2 times per day   . sodium chloride 0.9 % (NS) syringe  10 mL Intravenous Q8H SCH   . sodium chloride 0.9 % (NS) syringe  5-10 mL Intravenous Daily   . warfarin  2.5 mg Oral Daily     Infusions:   . amiodarone in D5W Stopped (02/11/16 0900)   . argatroban 0.8 mcg/kg/min (02/13/16 0455)   . clevidipine Stopped (02/02/16 0800)   . dexmedetomidine Stopped (02/05/16 1402)   . dextrose 10% (D10W)     . dextrose 5% lactated ringers Stopped (01/29/16 1829)   . DOBUTamine (DOBUTREX) 250 mg in D5W 250 mL (1000 mcg/mL) infusion     . milrinone Stopped (02/02/16 0736)   . nitroglycerin     . norepinephrine Stopped (02/05/16 1203)   . phenylephrine (NEO-SYNEPHRINE)  40 mg in NaCl 0.9 % 500 mL (80 mcg/mL) infusion **DOUBLE CONCENTRATION** Stopped (01/31/16 0515)   . vasopressin (PITRESSIN) 50 units in NaCl 0.9 % 250 mL (0.2 units/mL) infusion **ADULT SHOCK** Stopped (01/28/16 1711)     Recent Labs:  Lab Results   Component Value Date    NA 142  02/13/2016    K 3.4 (L) 02/13/2016    K 3.69 01/23/2016    CL 112 (H) 02/13/2016    CL 104.1 01/23/2016    CO2 22.5 02/13/2016    CO2 20 (L) 01/23/2016    GLU 101 (H) 02/13/2016    GLU 85 01/23/2016    BUN 30 (H) 02/13/2016    CREATININES 1.20 (H) 02/13/2016    CALCIUM 8.7 02/13/2016    AST 26 02/04/2016    ALT 10 02/04/2016    ALKPHOS 121 (H) 02/04/2016    BILITOT 0.90 01/21/2016    ALBUMIN 2.3 (L) 02/13/2016    GLOB 3.0 02/04/2016    AGRATIO 0.9 (L) 02/04/2016    ANIONGAP 7.5 02/13/2016    ANIONGAP 21.0 (H) 01/23/2016    LABGLOM 46 (L) 02/13/2016    LABGLOM >60.0 01/23/2016    WHITEBLOODCE 15.3 (H) 02/13/2016    LABPLAT 398 02/13/2016    HGB 6.7 (LCrit) 02/13/2016    PROTIME 32.0 (H) 02/13/2016    INR 2.8 (H) 02/13/2016    APTT 58 (H) 02/13/2016          Vital Signs:      ? Cardiac output   ? Cardiac Index   Temp: 37.1 ?C (98.8 ?F) BP: (!) 100/56 Pulse: 84 Resp: 20 SpO2: 98 % on O2 Flow Rate (L/min): 2 L/min None (Room air)   Intake/Output:     CT OUTPUT: removed     URINE OUTPUT: 2100  +6 liters    Exam:  Neuro: movine all extremities equal strength  Cardiac: NSR no murmurs  Lungs;clear  ABDOMEN:abdomen is soft without significant tenderness, masses, organomegaly or guarding  EXTREMITIES 2+ edema in the lower extremities toes appear much better digital ischemia much improved    PLAN:   Neurologic: No issues      Cardiac: Pre-op EF:   EF 15% prior to surgery   Post op 30% with good valve function 23mm valve     1. Inotropes off   2. Pressors: off   3. Rhythm: NSR   AMIODARONE   4. Aspirin: holding    5. Beta-Blocker: carvedilol 6.25    6. Statin: atorvastatin    7. ACE/ARB: hold        Respiratory:    Smoker none   Pulmonary toilet    Xray: wet but improved  ABX stopped     Gastrointestinal:   GI bleeding suspected prior to surgery   Anemic and received 2 units PRBC in [place]   Had melena post op       GI CONSULT NEEDED WHEN STABLE     Genitourinary: creat: pre op 0.9   1.25>2.16>2.76>3.15>2.4>1.78>1.49>1.2  OFF  CRRT 8/25 in AM    Infectious Disease:  WBC: 18>15>17>15>15    Hematology: HGB 7.1>7.2>6.7  PLTS 404 >461>425>398      hitt  ++++    started agatroban   Thrombocytopenia resolved on coumadin INR 2.8 today continue agatroban    Endocrine: Glucose management   Electrolytes: replacing Mg/K/Ca  Prophylaxis Argatroban PPI/H2 Blocker      SUMMARY    INR now 2.8 continue argatroban   Severe anemia  secondary to blood loss from surgery and chronic disease  Lasix once per day +6 liters kidney function improving      [employer]

## 2016-02-14 ENCOUNTER — Inpatient Hospital Stay: Admit: 2016-02-14 | Payer: MEDICAID

## 2016-02-14 LAB — CBC WITH AUTO DIFFERENTIAL
Bands %: 3 % (ref 0–10)
Bands, Absolute: 0.4 10*3/ÂµL (ref 0.0–1.2)
Basophils %: 1 % (ref 0–2)
Basophils, Absolute: 0.1 10*3/ÂµL (ref 0.0–0.2)
Eosinophils %: 3 % (ref 0–7)
Eosinophils, Absolute: 0.4 10*3/ÂµL (ref 0.0–0.7)
HCT: 22.6 % — ABNORMAL LOW (ref 37.0–48.0)
Hemoglobin: 6.6 g/dL — ABNORMAL LOW (ref 12.0–16.0)
Lymphocytes %: 7 % — ABNORMAL LOW (ref 25–45)
Lymphocytes, Absolute: 1 10*3/ÂµL — ABNORMAL LOW (ref 1.1–4.3)
MCH: 22.6 pg — ABNORMAL LOW (ref 27.0–34.0)
MCHC: 29.4 g/dL — ABNORMAL LOW (ref 32.0–36.0)
MCV: 77.1 fL — ABNORMAL LOW (ref 81.0–99.0)
MPV: 7.3 fL — ABNORMAL LOW (ref 7.4–10.4)
Metamyelocytes %: 2 % — ABNORMAL HIGH
Metamyelocytes, Absolute: 0.3 10*3/ÂµL — ABNORMAL HIGH
Monocytes %: 2 % (ref 0–12)
Monocytes, Absolute: 0.3 10*3/ÂµL (ref 0.0–1.2)
Myelocytes %: 1 % — ABNORMAL HIGH
Myelocytes, Absolute: 0.1 10*3/ÂµL — ABNORMAL HIGH
Neutrophils %: 81 % — ABNORMAL HIGH (ref 35–70)
Neutrophils, Absolute: 11.2 10*3/ÂµL — ABNORMAL HIGH (ref 1.6–7.3)
Platelet Count: 370 10*3/ÂµL (ref 150–400)
Platelet Estimate: INCREASED — AB
RBC: 2.92 10*6/ÂµL — ABNORMAL LOW (ref 4.20–5.40)
RDW: 28.4 % — ABNORMAL HIGH (ref 11.5–14.5)
WBC: 13.8 10*3/ÂµL — ABNORMAL HIGH (ref 4.8–10.8)

## 2016-02-14 LAB — RENAL FUNCTION PANEL
Albumin: 2.3 g/dL — ABNORMAL LOW (ref 3.5–5.0)
Anion Gap: 6.9 mmol/L (ref 3.0–11.0)
BUN: 22 mg/dL (ref 6–23)
CO2 - Carbon Dioxide: 22.1 mmol/L (ref 21.0–31.0)
Calcium: 8.6 mg/dL (ref 8.6–10.3)
Chloride: 111 mmol/L (ref 98–111)
Creatinine: 0.99 mg/dL (ref 0.55–1.10)
GFR Estimate: 57 mL/min/{1.73_m2} — ABNORMAL LOW (ref >=60)
Glucose: 88 mg/dL (ref 80–99)
Phosphorus: 2 mg/dL — ABNORMAL LOW (ref 2.5–4.5)
Potassium: 3.5 mmol/L (ref 3.5–5.1)
Sodium: 140 mmol/L (ref 135–143)

## 2016-02-14 LAB — APTT
APTT: 44 Seconds — ABNORMAL HIGH (ref 23–36)
APTT: 61 Seconds — ABNORMAL HIGH (ref 23–36)

## 2016-02-14 LAB — PROTIME-INR
INR: 3.2 — ABNORMAL HIGH (ref 0.9–1.2)
Protime: 36.7 Seconds — ABNORMAL HIGH (ref 10.0–13.0)

## 2016-02-14 LAB — ABO/RH (HCLL): Rh (D): POSITIVE

## 2016-02-14 LAB — ANTIBODY SCREEN (HCLL): Antibody Screen: NEGATIVE

## 2016-02-14 MED ORDER — warfarin (COUMADIN) tablet 5 mg
5 | Freq: Every day | ORAL | Status: DC
Start: 2016-02-14 — End: 2016-02-16
  Administered 2016-02-15 – 2016-02-16 (×2): 5 mg via ORAL

## 2016-02-14 MED ORDER — Normal saline infusion for medication/blood product administration for nursing
0.9 | INTRAVENOUS | Status: DC | PRN
Start: 2016-02-14 — End: 2016-02-15

## 2016-02-14 MED FILL — CARVEDILOL 3.125 MG TABLET: 3.125 mg | ORAL | Qty: 2

## 2016-02-14 MED FILL — SODIUM FERRIC GLUCONATE COMPLEX IN SUCROSE 62.5 MG/5 ML INTRAVENOUS: 62.5 | INTRAVENOUS | Qty: 10

## 2016-02-14 MED FILL — AMIODARONE 200 MG TABLET: 200 mg | ORAL | Qty: 2

## 2016-02-14 MED FILL — ARGATROBAN 50 MG/50 ML (1 MG/ML) IN SODIUM CHLORIDE (ISO-OSMOTIC) IV: 50 mg/50 mL (1 mg/mL) | INTRAVENOUS | Qty: 50

## 2016-02-14 MED FILL — ATORVASTATIN 20 MG TABLET: 20 mg | ORAL | Qty: 1

## 2016-02-14 MED FILL — FAMOTIDINE 20 MG TABLET: 20 mg | ORAL | Qty: 1

## 2016-02-14 MED FILL — COUMADIN 3 MG TABLET: 3 mg | ORAL | Qty: 1

## 2016-02-14 MED FILL — FUROSEMIDE 20 MG TABLET: 20 mg | ORAL | Qty: 1

## 2016-02-14 NOTE — Progress Notes (Signed)
Nutrition Follow-Up    Brandy [name] is a 60 y.o. female, DOB 1956-02-16    Subjective:  Recent intake values reflect: 50-75% meals. Pt reports she feels she is starting to eat more. Pt states she likes the strawberry smoothies with Beneprotein and chocolate CIB shakes and would like to continue these. Ordered some lunch items for pt.     Admit Anthropometric Measurements:   Ht: 167.6 cm (66)        Admit Wt: 101.4 kg (224 lbs)  IBW: 59.09 kg (130 lbs)   %IBW: 172%  BMI: 36.1 (no edema)   Ht/wt status:obese class 2     No appreciable wt loss since admission likely d/t volume status.               Nutrition-Focused Physical Findings:  Patient appears well-nourished and well-hydrated   1+ generalized edema, 1+ bilateral UE edema, 2+ bilateral LE edema, and 2+ bilateral pedal edema noted per RN assessment yesterday on 8/31  Poor dentition per physician note   Nutrition risk factors: Current morbidity   Risk for nutritional deficit:moderate       Food and Nutritional Intake:  Diet Orders:   Procedures   . Diet Dysphagia Minced; No - patient to receive automatic trays     PO Intake is improving; pt Is eating 50-75% of meals    Other Allergies: Heparin and Sulfa (sulfonamide antibiotics)     Client History:  Admission Diagnosis: Respiratory failure (CMS/HCC) [J96.90]    Diagnosis:     SNOMED CT(R)   1. Heparin induced thrombocytopenia (CMS/HCC)  HEPARIN-INDUCED THROMBOCYTOPENIA   2. Aortic stenosis, severe  AORTIC VALVE STENOSIS   3. Acute systolic heart failure (CMS/HCC)  ACUTE SYSTOLIC HEART FAILURE   4. Demand ischemia of myocardium (CMS/HCC)  ACUTE ISCHEMIC HEART DISEASE   5. Non-ischemic cardiomyopathy (CMS/HCC)  CARDIOMYOPATHY   6. Iron deficiency anemia due to chronic blood loss  IRON DEFICIENCY ANEMIA   7. NSTEMI (non-ST elevated myocardial infarction) (CMS/HCC)  ACUTE NON-ST SEGMENT ELEVATION MYOCARDIAL INFARCTION   8. Obesity due to excess calories, unspecified obesity severity  SIMPLE OBESITY   9. S/P AVR  (aortic valve replacement)  HISTORY OF AORTIC VALVE REPLACEMENT   10. Elevated brain natriuretic peptide (BNP) level  HORMONE LEVEL - FINDING   11. Tachycardia  TACHYCARDIA   12. Pleural effusion  PLEURAL EFFUSION   13. Acute respiratory failure with hypoxia (CMS/HCC)  ACUTE RESPIRATORY FAILURE   14. Thrombocytosis (CMS/HCC)  THROMBOCYTOSIS   15. Weight loss  WEIGHT DECREASED   16. Family history of cancer  FAMILY HISTORY OF CANCER   17. Cardiomegaly  CARDIOMEGALY   18. Mediastinal lymphadenopathy  MEDIASTINAL LYMPHADENOPATHY   19. Hilar lymphadenopathy- bilataeral   HILAR LYMPHADENOPATHY   20. Dyspnea and respiratory abnormality  DYSPNEA   21. Abnormal EKG  ELECTROCARDIOGRAM ABNORMAL   22. Opacity of lung on imaging study  ABNORMAL FINDINGS ON DIAGNOSTIC IMAGING OF LUNG   23. Liver lesion- small hypodense lesion around falciform lig  LESION OF LIVER   24. Respiratory failure, unspecified chronicity, unspecified whether with hypoxia or hypercapnia (CMS/HCC)  RESPIRATORY FAILURE     Past Medical History:   Diagnosis Date   . History of blood transfusion      Past Surgical History:   Procedure Laterality Date   . PROCEDURE N/A 01/27/2016    Procedure: AORTIC VALVE REPLACEMENT; INTRA AORTIC BALLOON INSERTION;  Surgeon: Otho Bellows, MD;  Location: [facility] OR;  Service: Cardiology;  Laterality: N/A;   .  TONSILLECTOMY     . TUBAL LIGATION     . TUBAL LIGATION  1980       Usual meds include: Reviewed   Current meds include: Pepcid, lasix, insulin, zofran, potassium chloride      Biochemical Data, Medical Tests and Procedures:  Lab Results   Component Value Date    NA 140 02/14/2016    K 3.5 02/14/2016    CL 111 02/14/2016    CO2 22.1 02/14/2016    GLU 88 02/14/2016    BUN 22 02/14/2016    CREATININES 0.99 02/14/2016    LABCREA 77 01/28/2016    MG 1.8 02/10/2016    PHOS 2.0 (L) 02/14/2016   Phos is BNL - replace     Nutrition Diagnosis (Problem, Etiology, Signs and Symptoms)  Inadequate protein-energy intake related to  poor appetite as evidenced by PO intake of 0-50% of meals and pt report of poor appetite.   (Progress:  Not applicable.)    Nutrition Intervention:   - Continue current diet per SLP; will follow for PO adequacy.  - Will continue to strawberry smoothie with Beneprotein BID.  - Will continue 12 oz chocolate milkshake with benecal and beneprotein once per day. Each milkshake will provide 725 kcals and 27g protein.  - Will send fruit with every tray.       Monitoring and Evaluation:   Will continue to follow and monitor PO intake and labs per nutrition protocol.  PO goal: >70% of meals/supplements.      Follow Up: 4-8 days      Laqueta Due, RD  02/14/2016  10:37 AM  [facility]

## 2016-02-14 NOTE — Treatment Summary (Signed)
IP PT TREATMENT NOTE     [name] / [birth year] DOB:  1955-10-20 / 60 y.o. Date:  02/14/2016     Precautions  Specific mobility precautions: Sternal precautions  Sternal precautions: Standard  Required Braces/Orthotics: None     ASSESSMENT     Summary:  Pt improving daily.  KNees buckle a bit with fatigue.  OK up with staff for short walks     Activity tolerance: Tolerates 30 min activity     Precaution Awareness: Fully aware of precaution(s)  Deficit Awareness: Decreased awareness of deficits  Correction of Errors: Self-corrects with cues  Safety Judgment: Good awareness of safety     Consult recommendations      Discharge recommendations  Mobility Equipment needs: To be determined later   Primary recommendation : Patient would benefit from intensive rehabilitation and is expected to be able to participate in at least 3 hrs of treatment daily  Recommended transport method: Wheelchair  Secondary recommendation: Patient would benefit from SNF/Nursing Home/Other Skilled Facility.  Recommended transport method: Wheelchair  Justification: far from previous baseline, medically complex, motivated      PLAN     Treatment Plan Frequency    Plan: Continue per established POC.  Pt. demonstrates limitations which require skilled PT intervention.  1x (5 days/week).  Frequency will be increased as pt's condition or discharge plan indicates.     Chaunda Vandergriff will remain on the Physical Therapy schedule until goals are met, or there has been a change in the Plan of Care.          Treatment Diagnosis Surgery / # Days Post-op (if applicable)    Primary Dx: Resp failure  Treatment Dx: altered mobility  Procedure(s) (LRB):  AORTIC VALVE REPLACEMENT; INTRA AORTIC BALLOON INSERTION (N/A) / 18 Days Post-Op       SUBJECTIVE        Pain Level  Current status:  (no compaint of pain)     OBJECTIVE        Mental status  General Demeanor: Pleasant;Cooperative  LOC: Alert  Orientation: Oriented x 4  Directions: Follows  one-step commands  --> Follows one-step commands: Consistently  Attention span: Appears intact  Memory: Appears intact in social/therapy situations   Medical appliances  Medical Appliances: IV;Telemetry;BP monitor;SPO2 monitor;HR monitor   Vitals       Other         TREATMENT  Bed Mobility/Transfers  Bed mobility       Rolling / logroll  Rolling R: NA      Transition to sitting up Supine to Sit: NA   To resting position      Transition to sitting down CGA     Transition to supine NA     Scooting NA   Transfers      Transition to standing From commode: Min Assist     Method Stand pivot: Min Assist      Gait/Stairs  Mobility - Gait (Level)  Gait (distance): 30 feet (2 x with standing rests)  Gait (assist level): CGA  Assistive device: Front wheeled walker (FWW)  Pace: slow  Pattern: Downward gaze    Ther. Ex.  LE exercises: Ankle pumps;Seated long arc quads  Addit. Exercises: trunk ext/abd sets, deep breathing    Balance  Balance - Basic  Static sitting balance: Assist level;Position  Assist level: SBA  Position: Feet - supported   Training/Education  Trainee(s): Primary Learner  Primary Learner: Patient  Primary Learner -  Barriers to Learning?: No  Training provided: sternal precautions  Response to training: receptive   Safety/Room Set-up   Call button accessible?: Yes  Phone accessible?: Yes  Oxygen reconnected?: No, on room air  Position on arrival: On bedside commode  Position on departure (upright): In regular chair     GOALS     Patient/Caregiver goals reviewed and integrated with rehab treatment plan:     Multidisciplinary Problems (Active)        Problem: Mobility: Ambulation    Goal Priority Disciplines Outcome   Gait (with precautions)     PT    Description:  Pt. to ambulate 50 feet with one person min assist, using FWW, following any pertinent precautions.    Gait Soil scientist)     PT    Description:               Problem: Mobility: Transfers    Goal Priority Disciplines Outcome   Supine to/from sitting  (with precautions)     PT    Description:  Pt. to transfer to/from sitting with one person min assist,  following any pertinent precautions.    Sitting to/from standing (with precautions)     PT    Description:  Pt. to transfer sitting to/from standing with one person min assist, following any pertinent precautions.    Bed to/from chair (with precautions)     PT    Description:  Pt. to transfer bed to/from chair with one person min assist, following any pertinent precautions.           Problem: Patient/Family Goal    Goal Priority Disciplines Outcome   Normal Status     PT    Description:  Pt. wants to return to previous level of function.     Mobility     PT    Description:  Patient wants to walk again.           Problem: Strength    Goal Priority Disciplines Outcome   Home Exercise Program (with precautions)     PT    Description:  Pt. to perform basic HEP independently following any pertinent precautions.                   G-Codes/PSFS/FIM Scores: (if applicable)          First session Second session (if applicable)    Start/Stop times: 0830 - 0855 Start/Stop times:   - Stop Time:     Total time: 25 minutes  Total time:   minutes     This note to serve as a Discharge Summary if Brean Carberry is discharged from the hospital or from therapy services.     Therapist:  Humphrey Rolls, PTA

## 2016-02-14 NOTE — Progress Notes (Signed)
RESPIRATORY ASSESSMENT    SUBJECTIVE:    Patient states her breathing has improved     OBJECTIVE:   Current Vital Signs:    Pre and post C/S: diminished    HR:  82   RR:  20              Cough: strong, productive    SpO2:   96% RA   CXR: FINDINGS:  The cardiac silhouette and mediastinal contours are unchanged   compared to prior. No pneumothorax or significant effusions are seen. There are   persistent perihilar and bibasilar opacities. There is a left-sided PICC line   in place.        ASSESSMENT:     Patient condition appears to be improving. Patient has less secretions and improved aeration bilaterally.     PLAN:    BID+ PRN albuterol MN, Acapella as needed encourage IS use and DB&C    GOAL(S):  Medication:   Mobilize secretions  Decrease work of breathing  Minimize  adventitious breath sounds  Maximize airflow      Pulmonary/Bronchial Hygiene:  Improve mucocilliary clearance  correct atelectasis    Supplemental Oxygen:   Maintain SpO2 >  90 %  Normalize and maintain an adequate SpO2/PaO2 appropriate for patient clinical situation    Per PDP # 102 and MD orders    E.S.  Roselie Skinner, RT

## 2016-02-14 NOTE — Progress Notes (Addendum)
PHARMACIST CRITICAL CARE PROGRESS NOTE     Consult Per P&T Protocol: Critical Care Physician Service, Antimicrobial Interventions, Renal Dosing, IV to PO    Physician Consult Request for Pharmacy:  Pharmacy to dose Warfarin and Argatroban for HIT         Summary: Caral MICHELYN SCULLIN is [name] admitted on 01/23/2016 with a NSTEMI s/p R&L heart catheterization. PICC line placed 8/25. CRRT from 8/20-8/25. 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 Ongoing melena w/ last stool 9/2 occult blood positive.    Subject:    Allergies: Heparin and Sulfa (sulfonamide antibiotics)    PMH:  has a past medical history of History of blood transfusion.     Surgical history:     Active problems:   ? Heparin induced thrombocytopenia  ? S/P Aortic valve replacement       OBJECTIVE:       Height: 167.6 cm (66) Admit weight: 101.4 kg (223 lb 8.7 oz) Last charted weight: 107.5 kg (236 lb 15.9 oz) IBW: 59.3 kg Body mass index is 38.25 kg/(m^2).      Recent Labs      02/12/16   0525  02/13/16   0448  02/14/16   0539   NA  139  142  140   K  3.5  3.4*  3.5   CL  112*  112*  111   CO2  22.0  22.5  22.1   GLU  104*  101*  88   BUN  38*  30*  22   CREATININES  1.49*  1.20*  0.99   CALCIUM  8.8  8.7  8.6   ALBUMIN  2.2*  2.3*  2.3*     No results for input(s): POCGLU in the last 72 hours.  Recent Labs      02/12/16   0525  02/13/16   0448  02/14/16   0539   WHITEBLOODCE  15.8*  15.3*  13.8*   NEUTROABS  13.7*   --   11.2*   BANDSABS  0.2   --   0.4   HGB  6.5*  6.7*  6.6*   HCT  21.9*  22.1*  22.6*   LABPLAT  425*  398  370     Warfarin Dosing Record:  Date: 8/31 9/1 9/2 9.3   INR: 2.9 3 4.1 2.8   Warfarin: 3 mg 3 mg 2.5      ASSESSMENT:     Estimated CrCl: 75 mL/min (CG AdjBW) per [employer]    Argatroban infusion:  ? 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 Thrombosis in right lower extremity on 8/22  ? Argatroban at 0.8 mcg/kg/min   ? APTT = 44 (goal 50 - 90); INR 3.2  (goal 4 - 6 w/ concurrent warfarin + argatroban)  ? Day 5 overlap with warfarin    Recent vitamin K administration: None noted in Epic   Warfarin Indication:  HIT w/ thrombosis in RLE on 8/22; AFib   Goal INR: 2-3 (4-6 while on argatroban)   Today's INR: Subtherapeutic   Home warfarin dose: N/A   Additional anticoagulant/antiplatelet:  Argatroban 0.8 mcg/kg/min   Comments about potential drug, disease or dietary interactions? Amiodarone, argatroban, acetaminophen   Warfarin education status: Not given     PLAN:     Warfarin:  Given INR below target (target 4 - 6 w/ argatroban+warfarin) - increase warfarin 5 mg po daily  Daily INR  and transition off argatroban when INR therapeutic per protocol

## 2016-02-14 NOTE — Progress Notes (Signed)
CARDIOTHORACIC SURGERY CRITICAL CARE NOTE     Name: Brandy [name]  DOB: 1955-12-01 60 y.o.  MRN: [id number]  CSN: [id number]    Brandy [name] is a 60 y.o. female patient. Patient was admitted with Respiratory failure (CMS/HCC)  Who had Procedure(s):  AORTIC VALVE REPLACEMENT; INTRA AORTIC BALLOON INSERTION.  18 Days Post-Op    HPI: The patient is a 60 y.o. female with the below medical co-morbid conditions.    ?  1. Severe symptomatic aortic stenosis                        Ejection fraction 30%     Found on TEE intra-op to be 15%                        Normal coronaries                        Predominant symptom dyspnea                     2. Severe Pulmonary Hypertension  3. Mild to Moderate Mitral regurgitation  4. Morbid obesity                        Weight loss 50 pounds in the last one year  5. respiratory insufficiency  6. Non-ST elevation MI                        The demand ischemia  7. Iron deficiency anemia                        CT chest abdomen and pelvis reviewed no abdominal masses                        Likely AVM secondary to aortic stenosis not a good candidate for sedation and EGD or colonoscopy well aortic stenosis is so critical  ?  Patient's a 60 year old female who presented the [facility] falls in respiratory distress unable to breathe.  There are her troponin was elevated she underwent oxygen therapy and was given Lasix.  Cardiac workup showed normal coronary arteries with depressed LV function on echo and severe aortic stenosis valve area less than 0.4 velocity 4.4 m/s with an EF 30%.  She was transfused 2 units of PRBCs A [facility] with a hemoglobin 8.8.  His immobility volume overload from heart failure  ?  Patient hasn't been to the doctor in years she hasn't taken any medications nor does she take good care of herself.  [place] she is unable to consistently take meds.  She likes to use and asked to [employer] wood to heat her house.    ?  After a few  days of Lasix patient states that she was breathing much better she is 60% better per her suggestion.  I had a long talk with the family about the disease course why she was getting short of breath and discussion of heart failure.  They understand that she is given a need aortic valve replacement she wishes to have tissue valve I do think she is a better surgical candidate and have her given her age and risk.  ?  She has very poor dentition    POD #1  Patient has  been stable on balloon 1:2   Methylene blue last night SVR much better   Coronary perfusion good   Today I am managing acute heart failure with management of the IABP.    POD #2  removal of the intra-aortic balloon pump renal ultrasound completed showing minimal flow to the kidneys  patient still making urine  POD #3 CRRT started  POD #4 CRRT running acid base more stable pressures much better off NEO and LEVOPHED improved making urine  POD#5 CRRT continues   Patient plt 25   Will check for DVT   Postoperative day #6 CRRT continues platelets now up to 32 treating for heparin-induced from cytopenia  Postoperative day #7 Trialysis Cath replaced secondary to malfunction.  On agatroban still hemodynamics improving oxygen requirements lower.  Postoperative day #8 making transition to hemodialysis   Postoperative day #9 +12 liters still  Will need HD likely     POD #10:  11.8 L positive. Planning hemodialysis today.  remains in A. Fib on the amiodarone protocol.  POD#11: 8 liters positive.  No other changes  POD#12: still in AFIB rates 110-130   Multiple amiodarone loads without success...digoxin not good option secondary to renal failure  POD#13 doing well  Cardioversion by Dr. Truddie Hidden  POD#14 no major issues   In NSR  POD#15 NSR doing well needs rehab   POD#16 NSR looked good with PT yesterday getting stronger ischemic toes feel much better   POD#17 NSR continues on argatroban INR 2.8  POD#18 NSR  INR 3.2    Subjective: making progress    Assessment & Plan:          Active Hospital Problems    Diagnosis SNOMED CT(R) Date Noted   . Acute renal failure with tubular necrosis (CMS/HCC) ACUTE RENAL FAILURE SYNDROME 02/13/2016   . Heparin induced thrombocytopenia (CMS/HCC) HEPARIN-INDUCED THROMBOCYTOPENIA 02/02/2016   . S/P AVR (aortic valve replacement) HISTORY OF AORTIC VALVE REPLACEMENT 01/27/2016   . Respiratory failure (CMS/HCC) RESPIRATORY FAILURE 01/23/2016   . Aortic stenosis, severe AORTIC VALVE STENOSIS 01/22/2016   . NSTEMI (non-ST elevated myocardial infarction) (CMS/HCC) ACUTE NON-ST SEGMENT ELEVATION MYOCARDIAL INFARCTION 01/21/2016   . Demand ischemia of myocardium (CMS/HCC) ACUTE ISCHEMIC HEART DISEASE 01/21/2016   . Iron deficiency anemia IRON DEFICIENCY ANEMIA 01/21/2016   . Obesity OBESITY 01/21/2016       Scheduled Medications:   . albuterol  2.5 mg Nebulization 4x daily   . amiodarone  400 mg Oral 2 times per day   . atorvastatin  20 mg Oral Nightly   . carvedilol  6.25 mg Oral BID with meals   . famotidine  20 mg Oral 2 times per day   . ferric gluconate (FERRLECIT) IVPB  125 mg Intravenous Daily   . furosemide  20 mg Oral Daily   . nystatin   Topical 2 times per day   . ondansetron       . sodium chloride 0.9 % (NS) syringe  10 mL Intravenous Q8H SCH   . sodium chloride 0.9 % (NS) syringe  5-10 mL Intravenous Daily   . warfarin  3 mg Oral Daily     Infusions:   . argatroban 0.8 mcg/kg/min (02/14/16 0257)   . milrinone Stopped (02/02/16 0736)     Recent Labs:  Lab Results   Component Value Date    NA 140 02/14/2016    K 3.5 02/14/2016    K 3.69 01/23/2016    CL 111 02/14/2016    CL  104.1 01/23/2016    CO2 22.1 02/14/2016    CO2 20 (L) 01/23/2016    GLU 88 02/14/2016    GLU 85 01/23/2016    BUN 22 02/14/2016    CREATININES 0.99 02/14/2016    CALCIUM 8.6 02/14/2016    AST 26 02/04/2016    ALT 10 02/04/2016    ALKPHOS 121 (H) 02/04/2016    BILITOT 0.90 01/21/2016    ALBUMIN 2.3 (L) 02/14/2016    GLOB 3.0 02/04/2016    AGRATIO 0.9 (L) 02/04/2016    ANIONGAP 6.9  02/14/2016    ANIONGAP 21.0 (H) 01/23/2016    LABGLOM 57 (L) 02/14/2016    LABGLOM >60.0 01/23/2016    WHITEBLOODCE 13.8 (H) 02/14/2016    LABPLAT 370 02/14/2016    HGB 6.6 (LCrit) 02/14/2016    PROTIME 36.7 (H) 02/14/2016    INR 3.2 (H) 02/14/2016    APTT 44 (H) 02/14/2016          Vital Signs:      ? Cardiac output   ? Cardiac Index   Temp: 36.4 ?C (97.5 ?F) BP: 114/66 Pulse: 80 Resp: 19 SpO2: 99 % on O2 Flow Rate (L/min): 2 L/min None (Room air)   Intake/Output:     CT OUTPUT: removed     URINE OUTPUT: 2100  +5 liters    Exam:  Neuro: movine all extremities equal strength  Cardiac: NSR no murmurs  Lungs;clear  ABDOMEN:abdomen is soft without significant tenderness, masses, organomegaly or guarding  EXTREMITIES 2+ edema in the lower extremities toes appear much better digital ischemia much improved    PLAN:   Neurologic: No issues      Cardiac: Pre-op EF:   EF 15% prior to surgery   Post op 30% with good valve function 23mm valve     1. Inotropes off   2. Pressors: off   3. Rhythm: NSR   AMIODARONE   4. Aspirin: holding    5. Beta-Blocker: carvedilol 6.25    6. Statin: atorvastatin    7. ACE/ARB: hold        Respiratory:    Smoker none   Pulmonary toilet    Xray: wet but improved  ABX stopped     Gastrointestinal:   Nutritional status not great     GI bleeding suspected prior to surgery   Anemic and received 2 units PRBC in [place]   Had melena post op       GI CONSULT NEEDED WHEN STABLE     Genitourinary: creat: pre op 0.9   1.25>2.16>2.76>3.15>2.4>1.78>1.49>1.2>0.99  OFF CRRT 8/25 in AM    Infectious Disease:  WBC: 18>15>17>15>15>13.8     Hematology: HGB 7.1>7.2>6.7>6.6  PLTS 404 >461>425>398>390       hitt  ++++    started agatroban   Thrombocytopenia resolved on coumadin INR 3.2 today continue agatroban    Endocrine: Glucose management   Electrolytes: replacing Mg/K/Ca  Prophylaxis Argatroban PPI/H2 Blocker      SUMMARY    INR now 3.2 continue argatroban   Severe anemia secondary to blood loss from surgery and  chronic disease  Lasix once per day +5 liters kidney function improving  2 units blood today     Otho Bellows

## 2016-02-15 ENCOUNTER — Inpatient Hospital Stay: Admit: 2016-02-15 | Payer: MEDICAID

## 2016-02-15 LAB — RENAL FUNCTION PANEL
Albumin: 2.6 g/dL — ABNORMAL LOW (ref 3.5–5.0)
Anion Gap: 9 mmol/L (ref 3.0–11.0)
BUN: 16 mg/dL (ref 6–23)
CO2 - Carbon Dioxide: 23 mmol/L (ref 21.0–31.0)
Calcium: 8.6 mg/dL (ref 8.6–10.3)
Chloride: 109 mmol/L (ref 98–111)
Creatinine: 0.92 mg/dL (ref 0.55–1.10)
GFR Estimate: >60 mL/min/{1.73_m2} (ref >=60)
Glucose: 100 mg/dL — ABNORMAL HIGH (ref 80–99)
Phosphorus: 1.8 mg/dL — ABNORMAL LOW (ref 2.5–4.5)
Potassium: 3.6 mmol/L (ref 3.5–5.1)
Sodium: 141 mmol/L (ref 135–143)

## 2016-02-15 LAB — PROTIME-INR
INR: 4 — ABNORMAL HIGH (ref 0.9–1.2)
Protime: 45.4 Seconds — ABNORMAL HIGH (ref 10.0–13.0)

## 2016-02-15 LAB — CBC WITH AUTO DIFFERENTIAL
Bands %: 1 % (ref 0–10)
Bands, Absolute: 0.1 10*3/ÂµL (ref 0.0–1.2)
Basophils %: 0 % (ref 0–2)
Basophils, Absolute: 0 10*3/ÂµL (ref 0.0–0.2)
Eosinophils %: 0 % (ref 0–7)
Eosinophils, Absolute: 0 10*3/ÂµL (ref 0.0–0.7)
HCT: 28.2 % — ABNORMAL LOW (ref 37.0–48.0)
Hemoglobin: 8.9 g/dL — ABNORMAL LOW (ref 12.0–16.0)
Lymphocytes %: 12 % — ABNORMAL LOW (ref 25–45)
Lymphocytes, Absolute: 1.7 10*3/ÂµL (ref 1.1–4.3)
MCH: 24.8 pg — ABNORMAL LOW (ref 27.0–34.0)
MCHC: 31.5 g/dL — ABNORMAL LOW (ref 32.0–36.0)
MCV: 78.5 fL — ABNORMAL LOW (ref 81.0–99.0)
MPV: 7.4 fL (ref 7.4–10.4)
Metamyelocytes %: 2 % — ABNORMAL HIGH
Metamyelocytes, Absolute: 0.3 10*3/ÂµL — ABNORMAL HIGH
Monocytes %: 2 % (ref 0–12)
Monocytes, Absolute: 0.3 10*3/ÂµL (ref 0.0–1.2)
Neutrophils %: 83 % — ABNORMAL HIGH (ref 35–70)
Neutrophils, Absolute: 11.6 10*3/ÂµL — ABNORMAL HIGH (ref 1.6–7.3)
Platelet Count: 345 10*3/ÂµL (ref 150–400)
Platelet Estimate: NORMAL
RBC: 3.59 10*6/ÂµL — ABNORMAL LOW (ref 4.20–5.40)
RDW: 28.1 % — ABNORMAL HIGH (ref 11.5–14.5)
WBC: 14 10*3/ÂµL — ABNORMAL HIGH (ref 4.8–10.8)

## 2016-02-15 LAB — POCT GLUCOSE: POC Glucose: 111 mg/dL — ABNORMAL HIGH (ref 80–99)

## 2016-02-15 LAB — APTT: APTT: 63 Seconds — ABNORMAL HIGH (ref 23–36)

## 2016-02-15 MED ORDER — polyethylene glycol (MIRALAX) packet 17 g
17 | Freq: Three times a day (TID) | ORAL | Status: DC | PRN
Start: 2016-02-15 — End: 2016-02-17

## 2016-02-15 MED ORDER — hydrALAZINE (APRESOLINE) injection 10 mg
20 | INTRAMUSCULAR | Status: DC | PRN
Start: 2016-02-15 — End: 2016-02-17

## 2016-02-15 MED ORDER — HYDROcodone-acetaminophen (NORCO) 5-325 mg per tablet 1-2 tablet
5-325 | ORAL | Status: DC | PRN
Start: 2016-02-15 — End: 2016-02-17

## 2016-02-15 MED ORDER — ascorbic acid (vitamin C) (VITAMIN C) tablet 1,000 mg
500 | Freq: Every day | ORAL | Status: DC
Start: 2016-02-15 — End: 2016-02-17
  Administered 2016-02-16 – 2016-02-17 (×3): 500 mg via ORAL

## 2016-02-15 MED ORDER — magnesium hydroxide (MILK OF MAGNESIA) 400 mg/5 mL oral suspension 30 mL
400 | Freq: Every day | ORAL | Status: DC | PRN
Start: 2016-02-15 — End: 2016-02-17

## 2016-02-15 MED ORDER — morphine injection 2-4 mg
2 | INTRAVENOUS | Status: DC | PRN
Start: 2016-02-15 — End: 2016-02-17

## 2016-02-15 MED ORDER — acetaminophen (TYLENOL) tablet 650 mg
325 | ORAL | Status: DC | PRN
Start: 2016-02-15 — End: 2016-02-17

## 2016-02-15 MED ORDER — docusate calcium (SURFAK) capsule 240 mg
240 | Freq: Two times a day (BID) | ORAL | Status: DC
Start: 2016-02-15 — End: 2016-02-17
  Administered 2016-02-16 – 2016-02-17 (×4): 240 mg via ORAL

## 2016-02-15 MED ORDER — furosemide (LASIX) tablet 40 mg
40 | Freq: Every day | ORAL | Status: DC
Start: 2016-02-15 — End: 2016-02-17
  Administered 2016-02-15 – 2016-02-17 (×3): 40 mg via ORAL

## 2016-02-15 MED ORDER — morphine injection 2-8 mg
2 | INTRAVENOUS | Status: DC | PRN
Start: 2016-02-15 — End: 2016-02-17

## 2016-02-15 MED ORDER — albuterol (PROVENTIL) nebulizer solution 2.5 mg
2.5 | Freq: Two times a day (BID) | RESPIRATORY_TRACT | Status: DC
Start: 2016-02-15 — End: 2016-02-16
  Administered 2016-02-15 – 2016-02-16 (×3): 2.5 mg via RESPIRATORY_TRACT

## 2016-02-15 MED ORDER — ALPRAZolam (XANAX) tablet 0.25 mg
0.25 | Freq: Four times a day (QID) | ORAL | Status: DC | PRN
Start: 2016-02-15 — End: 2016-02-17

## 2016-02-15 MED ORDER — oxyCODONE (ROXICODONE) tablet 5-10 mg
5 | ORAL | Status: DC | PRN
Start: 2016-02-15 — End: 2016-02-17

## 2016-02-15 MED ORDER — magnesium sulfate 2 g in Water IVPB Premix
2 | INTRAVENOUS | Status: DC | PRN
Start: 2016-02-15 — End: 2016-02-17

## 2016-02-15 MED ORDER — folic acid (FOLVITE) tablet 1 mg
1 | Freq: Every day | ORAL | Status: DC
Start: 2016-02-15 — End: 2016-02-17
  Administered 2016-02-16 – 2016-02-17 (×3): 1 mg via ORAL

## 2016-02-15 MED ORDER — simethicone (MYLICON) chewable tablet 80 mg
80 | Freq: Four times a day (QID) | ORAL | Status: DC | PRN
Start: 2016-02-15 — End: 2016-02-17

## 2016-02-15 MED ORDER — albuterol (PROVENTIL) nebulizer solution 2.5 mg
2.5 | RESPIRATORY_TRACT | Status: DC | PRN
Start: 2016-02-15 — End: 2016-02-17

## 2016-02-15 MED ORDER — zolpidem (AMBIEN) tablet 5 mg
5 | Freq: Every evening | ORAL | Status: DC | PRN
Start: 2016-02-15 — End: 2016-02-17

## 2016-02-15 MED FILL — MILRINONE 20 MG/100 ML(200 MCG/ML) IN 5 % DEXTROSE INTRAVENOUS PIGGYBK: 200 ug/mL | INTRAVENOUS | Qty: 100

## 2016-02-15 MED FILL — COUMADIN 5 MG TABLET: 5 mg | ORAL | Qty: 1

## 2016-02-15 MED FILL — ALBUTEROL SULFATE 2.5 MG/3 ML (0.083 %) SOLUTION FOR NEBULIZATION: 2.5 mg/3 mL (0.083 %) | RESPIRATORY_TRACT | Qty: 3

## 2016-02-15 MED FILL — CARVEDILOL 3.125 MG TABLET: 3.125 mg | ORAL | Qty: 2

## 2016-02-15 MED FILL — AMIODARONE 200 MG TABLET: 200 mg | ORAL | Qty: 2

## 2016-02-15 MED FILL — FAMOTIDINE 20 MG TABLET: 20 mg | ORAL | Qty: 1

## 2016-02-15 MED FILL — ARGATROBAN 50 MG/50 ML (1 MG/ML) IN SODIUM CHLORIDE (ISO-OSMOTIC) IV: 50 mg/50 mL (1 mg/mL) | INTRAVENOUS | Qty: 50

## 2016-02-15 MED FILL — ATORVASTATIN 20 MG TABLET: 20 mg | ORAL | Qty: 1

## 2016-02-15 MED FILL — SODIUM FERRIC GLUCONATE COMPLEX IN SUCROSE 62.5 MG/5 ML INTRAVENOUS: 62.5 | INTRAVENOUS | Qty: 10

## 2016-02-15 MED FILL — FUROSEMIDE 40 MG TABLET: 40 mg | ORAL | Qty: 1

## 2016-02-15 NOTE — Treatment Summary (Signed)
IP OT TREATMENT NOTE    [name] / [id number] DOB:  [birth year] / 60 y.o. Date:  02/15/2016     Precautions  Specific mobility precautions: Sternal precautions  Sternal Precautions: Standard     ASSESSMENT     Summary:  Pt participated well in OT today. Pt continues to require max A for LB dressing of donning/doffing socks. Pt requiring mod-max A for bed mobility to transfer supine to EOB. Pt completed self-care tasks standing at sink with CGA and use of FWW. Pt SOB ambulating to bathroom and completing tasks in standing, required sitting resting break. Pt requiring assistance for toileting tasks. Pt required min A sit to stand from bed and mod A and v/c's sit to stand from toilet to adhere to sternal precautions. Pt demonstrating improvements, though requiring continued skilled rehab to address decreased activity tolerance, decreased ADL's and decreased strength.      Activity tolerance: Tolerates 30 min activity with multiple rests  Unable to Tolerate Activity: Excessive muscle fatigue  Comments: Pt making progress, though continues to have decreased activity tolerance.      Correction of Errors: Self-corrects with cues     Consult recommendations      Discharge recommendations  Mobility Equipment needs: To be determined later   Primary recommendation : Patient would benefit from intensive rehabilitation and is expected to be able to participate in at least 3 hrs of treatment daily  Secondary recommendation: Patient would benefit from SNF/Nursing Home/Other Skilled Facility.  Justification: Recommending [name] upon d/c as pt was independent PLOF. Pt is highly motivated.         PLAN     Treatment Plan Frequency    Continue per established POC.  Pt. demonstrates limitations which require skilled OT intervention.  1x (5 days/week) Frequency will be increased as pt's condition or discharge plan indicates.     Makiyah Zentz will remain on the Occupational Therapy schedule until goals are met, or there  has been a change in the Plan of Care.          Treatment Diagnosis Surgery / # Days Post-op (if applicable)    Primary Dx: Respiratory failure  Procedure(s) (LRB):  AORTIC VALVE REPLACEMENT; INTRA AORTIC BALLOON INSERTION (N/A) / 19 Days Post-Op       SUBJECTIVE   Pt agreeable to OT treatment. Family present at bedside.      Pain Level  Current status: Pain does not limit pt's ability to participate in therapy  Pain at start of session: 0 - No Pain  Pain at end of session: 0 - No Pain     OBJECTIVE   ADL's standing at sink and in bathroom. Standing tolerance. Functional transfers and bed mobility.      Mental status  Mental Status  General Demeanor: Pleasant;Cooperative  LOC: Alert  Directions: Follows one-step commands  --> Follows one-step commands: Consistently  Attention span: Appears intact  Memory: Appears intact in social/therapy situations  Cognition  Initiation: Appears intact   TREATMENT  Bed Mobility/Transfers  Bed mobility       Rolling / logroll         Transition to sitting up Supine to Sit: Mod Assist;Max Assist;Verbal cues   To resting position      Transition to sitting down CGA;Verbal cues     Transition to supine       Scooting     Transfers      Transition to standing From bed: Min Assist;Verbal  cues (with bed slightly elevated)  From toilet: Mod Assist;Verbal cues     Method        ADLs  Where assessed: Standing at the sink  Grooming: CGA;Needs guidance for safety  Toileting: Mod Assist  Lower body dress: Max Assist   Training/Education  Trainee(s): Producer, television/film/video Learner: Patient  Engineer, production - Barriers to Learning?: Yes  Primary Learner - Specific barriers: Physical  Training provided: ADL's, activity tolerance, functional transfers  Response to training: Receptive and appreciatve   Safety/Room set-up   Call button accessible?: Yes  Position on arrival: In bed  Position on departure (upright): In regular chair;Sitter/Family present (Nsg aware)     GOALS     Patient/Caregiver  goals reviewed and integrated with rehab treatment plan:     Multidisciplinary Problems (Active)        Problem: ADLs    Goal Priority Disciplines Outcome   ADL Activity Tolerance for Self Care     OT    Description:  Pt  to tolerate 15  minutes of self care activity in seated position    Caregiver     OT    Description:  Caregiver will demonstrate the ability to assist with patient's care, as needed.           Problem: Edema    Goal Priority Disciplines Outcome   Stiffness/Joint mobility     OT    Description:  Pt's edema will be managed to prevent long term stiffness and loss of joint mobility with ROM, elevation, etc.             Problem: IP Post Cardiac Goal List    Goal Priority Disciplines Outcome   Self-Care UE     OT    Description:  Patient to perform  upper self-care, following sternal precautions with min assistance.    Self-Care LE     OT    Description:  Patient to perform lower body self-care with ,min assistance, except TED hose.      Toilet Transfers     OT    Description:  Patient to be able to complete modified toilet transfers with min assistance.     Bath/Shower Transfers     OT    Description:  Patient to perform  modified shower transfers with  min assist.    Precautions     OT    Description:  Patient to follow sternal  precautions, while performing ADLs.           Problem: ROM    Goal Priority Disciplines Outcome   AROM     OT    Description:  Patient to increase movement in B UE joint(s) to [facility].             Problem: Safety    Goal Priority Disciplines Outcome   Safety DME     OT    Description:  Pt/Caregiver to demonstrate awareness of DME needs for safety upon discharge.           Problem: Strength    Goal Priority Disciplines Outcome   Functional Strength     OT    Description:  Patient will use both UEs bilaterally, in self- care/ADLs.      Fine Motor Tasks     OT    Description:  Pt. to be able to use upper extremities for fine motor tasks.                G-Codes/PSFS/FIM  Scores: (if  applicable)          First session Second session (if applicable)    Start/Stop times: 1315 - 1357 Start/Stop times:   - Stop Time:     Total time: 42 minutes  Total time:   minutes     This note to serve as a Discharge Summary if Danyell Shader is discharged from the hospital or from therapy services.     Therapist:  Werner Lean, OT

## 2016-02-15 NOTE — Progress Notes (Signed)
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 admitted on 01/23/2016 with a NSTEMI s/p R&L heart catheterization. PICC line placed 8/25. CRRT from 8/20-8/25. 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     Subject:  I spoke with the patient and explained pharmacist care.  Her husband and sister were also in the room when we discussed her INR and warfarin dosing strategy.  She is unfamiliar with the medication, a copy of the [facility] warfarin patient information and a Coumadin pamphlet were left for the patient and her family to read prior to formal education tomorrow.  She is eating well, no issues identified with bleeding.  I discussed the patient's argatroban and warfarin therapy with Hilda Blades PA.  Today is day 6 of warfarin/argatroban and the INR is at 4.  When making rounds tomorrow, if the INR remains >4 they will have the argatroban drip stopped and a repeat INR done 4-6 hours later.  If the INR remains >2 the argatroban drip can be discontinued - see full recommendations at bottom of note.    Allergies: Heparin and Sulfa (sulfonamide antibiotics)  PMH:  has a past medical history of History of blood transfusion.   Active problems:   ? Heparin induced thrombocytopenia  ? S/P Aortic valve replacement     OBJECTIVE:     Height: 167.6 cm (66) Admit weight: 101.4 kg (223 lb 8.7 oz) Last charted weight: 108 kg (238 lb 1.6 oz) IBW: 59.3 kg Body mass index is 38.43 kg/(m^2).    Recent Labs      02/13/16   0448  02/14/16   0539  02/15/16   0445   NA  142  140  141   K  3.4*  3.5  3.6   CL  112*  111  109   CO2  22.5  22.1  23.0   GLU  101*  88  100*   BUN  30*  22  16   CREATININES  1.20*  0.99  0.92   CALCIUM  8.7  8.6  8.6   ALBUMIN  2.3*  2.3*  2.6*     Recent Labs      02/13/16   0448  02/14/16   0539  02/15/16   0445   WHITEBLOODCE  15.3*  13.8*  14.0*   NEUTROABS    --   11.2*  11.6*   BANDSABS   --   0.4  0.1   HGB  6.7*  6.6*  8.9*   HCT  22.1*  22.6*  28.2*   LABPLAT  398  370  345     Warfarin Dosing Record:  Date: 8/31 9/1 9/2 9/3 9/4 9/5   INR: 2.9 3 4.1 2.8 3.2 4.0   Warfarin: 3 mg 3 mg 2.5 3 mg 5 mg      ASSESSMENT:     Estimated CrCl: 81 mL/min (CG AdjBW) per [facility]      Argatroban infusion:  ? 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 Thrombosis in right lower extremity on 8/22  ? Argatroban at 0.9 mcg/kg/min   ? APTT = 63 (goal 50 - 90); INR 4.0 (goal 4 - 6 w/ concurrent warfarin + argatroban)  ? Day 6 overlap with warfarin  Recent vitamin K administration: None noted in Epic   Warfarin Indication:  HIT w/ thrombosis in RLE on 8/22; Atrial fibrillation   Goal INR: 2-3 (4-6 while on argatroban)   Today's INR: Therapeutic   Home warfarin dose: N/A   Additional anticoagulant/antiplatelet:  Argatroban 0.9 mcg/kg/min   Comments about potential drug, disease or dietary interactions? Amiodarone, argatroban, acetaminophen (0 mg/24 hrs)   Warfarin education status: 02/15/16 - left educational materials for patient to read     PLAN:     Warfarin:  ? Continue with warfarin 5 mg po tonight  ? Daily INR/PT ordered  ? Pharmacy to monitor and adjust dosing as needed    Argatroban:  ? If INR >4 with 9/6 labs, stop argatroban and get repeat INR 4-6 hours later  ? If repeat INR is <2, restart the argatroban and repeat the procedure daily until desired INR on warfarin alone is obtained  ? If repeat INR is 2-3, stop argatroban and continue with warfarin dosing  ? If repeat INR is >3, stop argatroban and consider warfarin dose adjustment    Signed:  Achille Rich, [facility]

## 2016-02-15 NOTE — Consults (Signed)
Physical Medicine Consult      Name: Brandy [name]  DOB: [birth year] 60 y.o.  MRN: [id number]  CSN: [id number]      Physical Medicine and Rehabilitation Physician Consultation was requested by Dr. Debroah [name] for assessment of rehabilitation needs.    History:   History of Present Illness  The patient is a 60 year-old woman who was admitted with acute cardio-pulmonary failure, was found to have severe aortic stenosis for which she required aortic valve replacement.  Required intra-aortic baloon pump initially.  She had a intra-operative ejection fraction of only 15% (but now estimated at 30-35%).  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  She did have hematochezia on 8/26.      Per surgical note: Patient's a 60 year old female who presented [facility] in respiratory distress unable to breathe, troponin was elevated, she underwent oxygen therapy and was given Lasix. ?Cardiac workup showed normal coronary arteries with depressed LV function on echo and severe aortic stenosis valve area less than 0.4 velocity 4.4 m/s with an EF 30%. ?She was transfused 2 units of PRBCs [facility] with a hemoglobin 8.8.   Patient hasn't been to the doctor in years she hasn't taken any medications nor does she take good care of herself. [place] she is unable to consistently take meds.  Functionally she is now able to walk about 30 feet with rest brakes, contact-guard assist. Worked well with OT but was quite impaired with transfers. Nevertheless, OT recs [name].   RT notes this am reveals improving pulmonary function and clearing.   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  Today she is supratherapeutic on coumadin.   Had renal failure, was on CRRT, now improved.    She does  have ischemic digits, probably from levofed and circulatory collapse.    Has received amiodarone for atrial fib, and also had successful cardioversion.   Also CRRT.  Now her renal function is normalized.  Has persistently elevated WBC count.  Her anemia is better status-post blood transfusion.     Had a FEES swallow evaluation, she did have some episodes of aspiration with thin liquids and some persistent residual in vallecula.  She is being cautiously advanced to dysphagia-pureed diet with think liquids, but careful monitoring.      WOCN notes reviewed as well, she has ischemic right index finger and right 3 rd toe as well as several pads on her toes.               Past Medical History:  Past Medical History:   Diagnosis Date   . History of blood transfusion      Past Surgical History:  Past Surgical History:   Procedure Laterality Date   . PROCEDURE N/A 01/27/2016    Procedure: AORTIC VALVE REPLACEMENT; INTRA AORTIC BALLOON INSERTION;  Surgeon: Otho Bellows, MD;  Location: [facility] OR;  Service: Cardiology;  Laterality: N/A;   . TONSILLECTOMY     . TUBAL LIGATION     . TUBAL LIGATION  1980     Current Medications:  Scheduled Meds:  . albuterol  2.5 mg Nebulization 2 times daily   . amiodarone  400 mg Oral 2 times per day   . atorvastatin  20 mg Oral Nightly   . carvedilol  6.25 mg Oral BID with meals   . famotidine  20 mg Oral 2 times per day   . ferric gluconate (FERRLECIT) IVPB  125 mg Intravenous Daily   . furosemide  40 mg Oral Daily   . nystatin   Topical 2 times per day   . ondansetron       . sodium chloride 0.9 % (NS) syringe  10 mL Intravenous Q8H SCH   . sodium chloride 0.9 % (NS) syringe  5-10 mL Intravenous Daily   . warfarin  5 mg Oral Daily     Continuous Infusions:  . argatroban 0.9 mcg/kg/min (02/15/16 0500)   . milrinone Stopped (02/02/16 0736)     PRN Meds:.acetaminophen, calcium gluconate IVPB, dextrose, hydrALAZINE, HYDROcodone-acetaminophen, magnesium sulfate IVPB, magnesium sulfate IVPB,  milrinone, morphine, sodium chloride 0.9% (NS), oxyCODONE, sodium chloride 0.9 % (NS) syringe   Prior to Admission medications    Medication Sig Start Date End Date Taking? Authorizing Provider   naproxen sodium (ALEVE) 220 MG tablet Take 220 mg by mouth daily as needed.    Historical Provider, MD     Allergies:  Heparin and Sulfa (sulfonamide antibiotics)    Family History:  Family History   Problem Relation Age of Onset   . Cancer Mother    . Heart disease Father    . Heart disease Sister    . Cancer Brother    . Cancer Maternal Aunt    . Heart disease Maternal Aunt    . Cancer Maternal Uncle    . Heart disease Maternal Uncle      Social History:  Lives with: Spouse      Prior LOF  Prior ambulatory status: Walked without assistive device  Home/Community: Pt. was independent in home & community   Home environment  Living Situation: House  Home style: One level  Number of stairs to enter home: 5  Tub/Shower: Yes  ADA Height Toilet: No   Home ADL equipment  Mobility Equipment: Manual wheelchair;Front wheeled walker (FWW) (From mother using items )  ADL Equipment: Shower chair;Shower grab bars   ?    Social History   Substance Use Topics   . Smoking status: Never Smoker   . Smokeless tobacco: Never Used   . Alcohol use No     Review of Systems:  Pertinent items are noted in HPI.    Physical Exam:     Vital Signs: Vital Sign Ranges for Last 24 Hours:  BP  Min: 98/59  Max: 127/77  Temp  Min: 36.6 ?C (97.9 ?F)  Max: 36.8 ?C (98.2 ?F)  Pulse  Min: 77  Max: 106  Resp  Min: 13  Max: 37  SpO2  Min: 89 %  Max: 100 %      Examination:  An alert 60 year-old woman, no distress  She is sitting up in chair, speech clear, mentation intact  Very slight conversational dyspnea is noted  Face symmetric, cranial nerves are intact  Sternotomy incision noted  There is a dressing over epigastric area from a drain site  Upper limbs:  The ecchymosis of several finger tips noted  She has mild, general weakness of both upper limbs, no focal  deficits.   Lower limbs:  There is marked edema of both legs, from feet to distal thighs.  The areas of darkness and ecchymosis noted on both feet, mainly the toes.  The plantar (weightbearing) surfaces are without discoloration.  She has grossly intact sensory function.  Motor function  shows mild, general weakness, no focal deficit.     Data:     Labs:  Results for orders placed or performed during the hospital encounter of 01/23/16 (from the past 24 hour(s))   APTT -Next Routine    Collection Time: 02/14/16  4:29 PM   Result Value Ref Range    APTT 61 (H) 23 - 36 Seconds   APTT -Daily    Collection Time: 02/15/16  4:45 AM   Result Value Ref Range    APTT 63 (H) 23 - 36 Seconds   CBC with Auto Differential -Daily    Collection Time: 02/15/16  4:45 AM   Result Value Ref Range    WBC 14.0 (H) 4.8 - 10.8 10*3/?L    RBC 3.59 (L) 4.20 - 5.40 10*6/?L    Hemoglobin 8.9 (L) 12.0 - 16.0 g/dL    HCT 16.1 (L) 09.6 - 48.0 %    MCV 78.5 (L) 81.0 - 99.0 fL    MCH 24.8 (L) 27.0 - 34.0 pg    MCHC 31.5 (L) 32.0 - 36.0 g/dL    RDW 04.5 (H) 40.9 - 14.5 %    Platelet Count 345 150 - 400 10*3/?L    MPV 7.4 7.4 - 10.4 fL    Neutrophils % 83 (H) 35 - 70 %    Bands % 1 0 - 10 %    Lymphocytes % 12 (L) 25 - 45 %    Monocytes % 2 0 - 12 %    Eosinophils % 0 0 - 7 %    Basophils % 0 0 - 2 %    Metamyelocytes % 2 (H) 0 %    Neutrophils, Absolute 11.6 (H) 1.6 - 7.3 10*3/?L    Bands, Absolute 0.1 0.0 - 1.2 10*3/?L    Lymphocytes, Absolute 1.7 1.1 - 4.3 10*3/?L    Monocytes, Absolute 0.3 0.0 - 1.2 10*3/?L    Eosinophils, Absolute 0.0 0.0 - 0.7 10*3/?L    Basophils, Absolute 0.0 0.0 - 0.2 10*3/?L    Metamyelocytes, Absolute 0.3 (H) 0.0 10*3/?L    Differential Type Manual Differential     Platelet Estimate Normal Normal    Anisocytosis 3+ (A) (none)    Hypochromasia 1+ (A) (none)    Macrocytes 1+ (A) (none)    Microcytes 1+ (A) (none)    Poikilocytosis 1+ (A) (none)   Renal Function Panel -Daily    Collection Time: 02/15/16  4:45 AM   Result Value  Ref Range    Albumin 2.6 (L) 3.5 - 5.0 g/dL    Calcium 8.6 8.6 - 81.1 mg/dL    Phosphorus 1.8 (L) 2.5 - 4.5 mg/dL    BUN 16 6 - 23 mg/dL    Creatinine 9.14 7.82 - 1.10 mg/dL    Sodium 956 213 - 086 mmol/L    Glucose 100 (H) 80 - 99 mg/dL    Potassium 3.6 3.5 - 5.1 mmol/L    Chloride 109 98 - 111 mmol/L    CO2 - Carbon Dioxide 23.0 21.0 - 31.0 mmol/L    Anion Gap 9.0 3.0 - 11.0 mmol/L    GFR Estimate >60 >=60 mL/min/1.50m*2    GFR Additional Info     Protime Panel -Now & Q AM    Collection Time: 02/15/16  4:45 AM   Result Value Ref Range    Protime 45.4 (H) 10.0 - 13.0 Seconds    INR 4.0 (H) 0.9 - 1.2  Imaging  CXR from 9/5:  FINDINGS:  Stable mild cardiomegaly. Intact sternotomy wires. Cardiac valve   prosthesis. No pneumothorax. Persistent vascular congestion probably   representing pulmonary venous hypertension. Probable subsegmental atelectasis   of lung bases. No definite pleural effusion. Unchanged LEFT PICC          Assessment:   This is a highly-complex 60 year-old woman who has had bioprosthetic aortic valve replacement.  Her hospitalization characterized by multiple severe issues including HIT, atrial fib, ARF, CHF, Non-STEMI, severe blood-loss anemia (thought to be from GI source), dysphagia, persistent pulmonary insufficiency, and general weakness and deconditioning.  Continues with marked edema in both lower limbs.  Has multiple areas of ischemic changes in fingers and toes, overall of moderate severity, no evidence of infection at this time.              Active Hospital Problems    Diagnosis SNOMED CT(R) Date Noted   . Acute renal failure with tubular necrosis (CMS/HCC) ACUTE RENAL FAILURE SYNDROME 02/13/2016   . Heparin induced thrombocytopenia (CMS/HCC) HEPARIN-INDUCED THROMBOCYTOPENIA 02/02/2016   . S/P AVR (aortic valve replacement) HISTORY OF AORTIC VALVE REPLACEMENT 01/27/2016   . Respiratory failure (CMS/HCC) RESPIRATORY FAILURE 01/23/2016   . Aortic stenosis, severe AORTIC VALVE STENOSIS  01/22/2016   . NSTEMI (non-ST elevated myocardial infarction) (CMS/HCC) ACUTE NON-ST SEGMENT ELEVATION MYOCARDIAL INFARCTION 01/21/2016   . Demand ischemia of myocardium (CMS/HCC) ACUTE ISCHEMIC HEART DISEASE 01/21/2016   . Iron deficiency anemia IRON DEFICIENCY ANEMIA 01/21/2016   . Obesity OBESITY 01/21/2016       Recommendations   Certainly she would be appropriate for intensive rehab given the level of complexity and significant compromise of multiple organ-systems.  From a medical standpoint she really does require close monitoring of a number of the above-described issues.  Mainly needs monitoring of H+H, renal function, fluid status, HR, etc.  She has significant functional impairments including mobility, self-care, and swallowing, and would benefit from intensive rehabilitation program.   Her goal is to go home with her husband, this seems like a reasonable goal within about 3 weeks.      Hansel Feinstein  02/15/2016  9:53 AM

## 2016-02-15 NOTE — Plan of Care (Addendum)
End of Shift Report     Significant Events Transferred from CCU.      Relevant   Assessment Findings INR 4.0.       Patient/Family  Questions/Concerns None verbalized.     DC Barriers Medical clearance.

## 2016-02-15 NOTE — Discharge Planning (AHS/AVS) (Signed)
[facility] referral received, reviewed chart.  Appears may be good candidate for [name], improving daily with PT, would like to review patient's progress with OT tomorrow to assess level of endurance for intensive therapies. There are no beds available on today on [name], but anticipate bed avail in 1-2 days, will need insurance approval.   Discussed with DC planner Hilda Lias, will follow up tomorrow am.

## 2016-02-15 NOTE — Progress Notes (Signed)
RESPIRATORY ASSESSMENT    SUBJECTIVE:   Patient states her reathing  seem to keep improving daily. Patient states she is a life long non smoker. Patient states she does not use oxygen, CPAP or any respiratory medications at home.     OBJECTIVE:   Current Vital Signs:    Pre and post C/S: Diminished/clear with no change post treatment   HR:  83   RR:  27              Cough:strong productive cough of small amount of thick white   SpO2:   93% on room air   ABG Results: no recent results     CXR: IMPRESSION:    No significant interval change.    ASSESSMENT:    Bronchodilator therapy and Acapella seem to help with mucociliary clearance.     PLAN:   2.5 mg Albuterol f/b Acapella BID and prn     GOAL(S):  Medication:   Mobilize secretions  Maximize airflow      Pulmonary/Bronchial Hygiene:  Improve mucocilliary clearance          Supplemental Oxygen:   Maintain SpO2 >  92 %  Normalize and maintain an adequate SpO2/PaO2 appropriate for patient clinical situation    Per PDP # 102 and MD orders    E.S.  Margretta Ditty, RRT

## 2016-02-15 NOTE — Progress Notes (Signed)
Wound care follow up for ischemic right index finger and right 3 rd toe as well as several pads on her toes.  Will plan to continue to paint them using iodine daily by staff.  Patient helped from the chair to bed to change out her chair so her lower extremities can be elevated.  Patient then transferred to chair using 2 people.  Standard precautions observed.  Coordinated care with patient, family and RN    No billed items

## 2016-02-15 NOTE — Treatment Summary (Signed)
IP PT TREATMENT NOTE     [name] / [id number] DOB:  03-11-1956 / 60 y.o. Date:  02/15/2016     Precautions  Specific mobility precautions: Sternal precautions  Sternal precautions: Standard  Required Braces/Orthotics: None     ASSESSMENT     Summary:  Pt improving daily, motivated, works hard.  (pt couldn't stand less than a week ago).  Edema is limiting somewhat in legs, consider tubi grip stockings)     Activity tolerance: Tolerates 30 min activity     Precaution Awareness: Fully aware of precaution(s)  Deficit Awareness: Fully aware of deficits  Correction of Errors: Self-corrects with cues  Safety Judgment: Good awareness of safety     Consult recommendations      Discharge recommendations  Mobility Equipment needs: To be determined later   Primary recommendation : Patient would benefit from intensive rehabilitation and is expected to be able to participate in at least 3 hrs of treatment daily  Recommended transport method: Wheelchair  Secondary recommendation: Patient would benefit from SNF/Nursing Home/Other Skilled Facility.  Recommended transport method: Wheelchair  Justification: medically complex, far from previous baseline      PLAN     Treatment Plan Frequency    Plan: Continue per established POC.  Pt. demonstrates limitations which require skilled PT intervention.  1x (5 days/week).  Frequency will be increased as pt's condition or discharge plan indicates.     Brandy [name] will remain on the Physical Therapy schedule until goals are met, or there has been a change in the Plan of Care.          Treatment Diagnosis Surgery / # Days Post-op (if applicable)    Primary Dx: Resp failure  Treatment Dx: altered mobility  Procedure(s) (LRB):  AORTIC VALVE REPLACEMENT; INTRA AORTIC BALLOON INSERTION (N/A) / 19 Days Post-Op       SUBJECTIVE        Pain Level  Current status: Pain somewhat limits pt's ability to participate in therapy  Pain at start of session: 3  Pain at end of session:  4  Pain at rest: 3  Pain with activity: 4  Pain location: Foot  Pain descriptors: Sore     OBJECTIVE        Mental status  General Demeanor: Pleasant;Cooperative  LOC: Alert  Orientation: Oriented x 4  Directions: Follows multi-step commands  --> Follows one-step commands: Consistently  Attention span: Appears intact  Memory: Appears intact in social/therapy situations   Medical appliances  Medical Appliances: IV;Telemetry   Vitals       Other         TREATMENT  Bed Mobility/Transfers  Bed mobility       Rolling / logroll  Rolling R: NA      Transition to sitting up Supine to Sit: NA   To resting position      Transition to sitting down CGA     Transition to supine NA     Scooting NA   Transfers      Transition to standing From bed: Min Assist  From chair: Min Assist  From commode: Min Assist     Method Stand pivot: Min Assist      Gait/Stairs  Mobility - Gait (Level)  Gait (distance): 60 feet  Gait (assist level): CGA  Assistive device: Front wheeled walker (FWW)  Pace: steady  Pattern: Downward gaze    Ther. Ex.  LE exercises: Ankle pumps;Seated long arc quads;Standing mini  squats  Addit. Exercises: trunk ext/abd sets, deep breathing    Balance      Training/Education      Safety/Room Set-up   Call button accessible?: Yes  Phone accessible?: Yes  Oxygen reconnected?: No, on room air  Position on arrival: In regular chair  Position on departure (upright): In regular chair     GOALS     Patient/Caregiver goals reviewed and integrated with rehab treatment plan:     Multidisciplinary Problems (Active)        Problem: Mobility: Ambulation    Goal Priority Disciplines Outcome   Gait (with precautions)     PT    Description:  Pt. to ambulate 50 feet with one person min assist, using FWW, following any pertinent precautions.    Gait Soil scientist)     PT    Description:               Problem: Mobility: Transfers    Goal Priority Disciplines Outcome   Supine to/from sitting (with precautions)     PT    Description:  Pt. to  transfer to/from sitting with one person min assist,  following any pertinent precautions.    Sitting to/from standing (with precautions)     PT    Description:  Pt. to transfer sitting to/from standing with one person min assist, following any pertinent precautions.    Bed to/from chair (with precautions)     PT    Description:  Pt. to transfer bed to/from chair with one person min assist, following any pertinent precautions.           Problem: Patient/Family Goal    Goal Priority Disciplines Outcome   Normal Status     PT    Description:  Pt. wants to return to previous level of function.     Mobility     PT    Description:  Patient wants to walk again.           Problem: Strength    Goal Priority Disciplines Outcome   Home Exercise Program (with precautions)     PT    Description:  Pt. to perform basic HEP independently following any pertinent precautions.                   G-Codes/PSFS/FIM Scores: (if applicable)          First session Second session (if applicable)    Start/Stop times: 1500 - 1530 Start/Stop times:   - Stop Time:     Total time: 30 minutes  Total time:   minutes     This note to serve as a Discharge Summary if Brandy [name] is discharged from the hospital or from therapy services.     Therapist:  Humphrey Rolls, PTA

## 2016-02-15 NOTE — Progress Notes (Signed)
Pt transferred to 3T. Report called to Marisue Humble, Charity fundraiser. All belongings transferred with the patient. At time of transfer pt in NAD.

## 2016-02-15 NOTE — Progress Notes (Signed)
CARDIOTHORACIC SURGERY CRITICAL CARE NOTE     Name: Brandy [name]  DOB: Jan 16, 1956 60 y.o.  MRN: [id number]  CSN: [id number]    Brandy [name] is a 60 y.o. female patient. Patient was admitted with Respiratory failure (CMS/HCC)  Who had Procedure(s):  AORTIC VALVE REPLACEMENT; INTRA AORTIC BALLOON INSERTION.  19 Days Post-Op    HPI: The patient is a 60 y.o. female with the below medical co-morbid conditions.    ?  1. Severe symptomatic aortic stenosis                        Ejection fraction 30%     Found on TEE intra-op to be 15%                        Normal coronaries                        Predominant symptom dyspnea                     2. Severe Pulmonary Hypertension  3. Mild to Moderate Mitral regurgitation  4. Morbid obesity                        Weight loss 50 pounds in the last one year  5. respiratory insufficiency  6. Non-ST elevation MI                        The demand ischemia  7. Iron deficiency anemia                        CT chest abdomen and pelvis reviewed no abdominal masses                        Likely AVM secondary to aortic stenosis not a good candidate for sedation and EGD or colonoscopy well aortic stenosis is so critical  ?  Patient's a 60 year old female who presented the [facility] falls in respiratory distress unable to breathe.  There are her troponin was elevated she underwent oxygen therapy and was given Lasix.  Cardiac workup showed normal coronary arteries with depressed LV function on echo and severe aortic stenosis valve area less than 0.4 velocity 4.4 m/s with an EF 30%.  She was transfused 2 units of PRBCs A [facility] hospital with a hemoglobin 8.8.  His immobility volume overload from heart failure  ?  Patient hasn't been to the doctor in years she hasn't taken any medications nor does she take good care of herself.  [place] she is unable to consistently take meds.  She likes to use and asked to [employer] wood to heat her house.    ?  After a few  days of Lasix patient states that she was breathing much better she is 60% better per her suggestion.  I had a long talk with the family about the disease course why she was getting short of breath and discussion of heart failure.  They understand that she is given a need aortic valve replacement she wishes to have tissue valve I do think she is a better surgical candidate and have her given her age and risk.  ?  She has very poor dentition    POD #1  Patient has  been stable on balloon 1:2   Methylene blue last night SVR much better   Coronary perfusion good   Today I am managing acute heart failure with management of the IABP.    POD #2  removal of the intra-aortic balloon pump renal ultrasound completed showing minimal flow to the kidneys  patient still making urine  POD #3 CRRT started  POD #4 CRRT running acid base more stable pressures much better off NEO and LEVOPHED improved making urine  POD#5 CRRT continues   Patient plt 25   Will check for DVT   Postoperative day #6 CRRT continues platelets now up to 32 treating for heparin-induced from cytopenia  Postoperative day #7 Trialysis Cath replaced secondary to malfunction.  On agatroban still hemodynamics improving oxygen requirements lower.  Postoperative day #8 making transition to hemodialysis   Postoperative day #9 +12 liters still  Will need HD likely     POD #10:  11.8 L positive. Planning hemodialysis today.  remains in A. Fib on the amiodarone protocol.  POD#11: 8 liters positive.  No other changes  POD#12: still in AFIB rates 110-130   Multiple amiodarone loads without success...digoxin not good option secondary to renal failure  POD#13 doing well  Cardioversion by Dr. Truddie Hidden  POD#14 no major issues   In NSR  POD#15 NSR doing well needs rehab   POD#16 NSR looked good with PT yesterday getting stronger ischemic toes feel much better   POD#17 NSR continues on argatroban INR 2.8  POD#18 NSR  INR 3.2  POD#19 NSR  INR 4.0     Subjective: making  progress    Assessment & Plan:     Active Hospital Problems    Diagnosis SNOMED CT(R) Date Noted   . Acute renal failure with tubular necrosis (CMS/HCC) ACUTE RENAL FAILURE SYNDROME 02/13/2016   . Heparin induced thrombocytopenia (CMS/HCC) HEPARIN-INDUCED THROMBOCYTOPENIA 02/02/2016   . S/P AVR (aortic valve replacement) HISTORY OF AORTIC VALVE REPLACEMENT 01/27/2016   . Respiratory failure (CMS/HCC) RESPIRATORY FAILURE 01/23/2016   . Aortic stenosis, severe AORTIC VALVE STENOSIS 01/22/2016   . NSTEMI (non-ST elevated myocardial infarction) (CMS/HCC) ACUTE NON-ST SEGMENT ELEVATION MYOCARDIAL INFARCTION 01/21/2016   . Demand ischemia of myocardium (CMS/HCC) ACUTE ISCHEMIC HEART DISEASE 01/21/2016   . Iron deficiency anemia IRON DEFICIENCY ANEMIA 01/21/2016   . Obesity OBESITY 01/21/2016       Scheduled Medications:   . albuterol  2.5 mg Nebulization 2 times daily   . amiodarone  400 mg Oral 2 times per day   . atorvastatin  20 mg Oral Nightly   . carvedilol  6.25 mg Oral BID with meals   . famotidine  20 mg Oral 2 times per day   . ferric gluconate (FERRLECIT) IVPB  125 mg Intravenous Daily   . furosemide  20 mg Oral Daily   . nystatin   Topical 2 times per day   . ondansetron       . sodium chloride 0.9 % (NS) syringe  10 mL Intravenous Q8H SCH   . sodium chloride 0.9 % (NS) syringe  5-10 mL Intravenous Daily   . warfarin  5 mg Oral Daily     Infusions:   . argatroban 0.9 mcg/kg/min (02/15/16 0500)   . milrinone Stopped (02/02/16 0736)     Recent Labs:  Lab Results   Component Value Date    NA 141 02/15/2016    K 3.6 02/15/2016    K 3.69 01/23/2016    CL 109 02/15/2016  CL 104.1 01/23/2016    CO2 23.0 02/15/2016    CO2 20 (L) 01/23/2016    GLU 100 (H) 02/15/2016    GLU 85 01/23/2016    BUN 16 02/15/2016    CREATININES 0.92 02/15/2016    CALCIUM 8.6 02/15/2016    AST 26 02/04/2016    ALT 10 02/04/2016    ALKPHOS 121 (H) 02/04/2016    BILITOT 0.90 01/21/2016    ALBUMIN 2.6 (L) 02/15/2016    GLOB 3.0 02/04/2016     AGRATIO 0.9 (L) 02/04/2016    ANIONGAP 9.0 02/15/2016    ANIONGAP 21.0 (H) 01/23/2016    LABGLOM >60 02/15/2016    LABGLOM >60.0 01/23/2016    WHITEBLOODCE 14.0 (H) 02/15/2016    LABPLAT 345 02/15/2016    HGB 8.9 (L) 02/15/2016    PROTIME 45.4 (H) 02/15/2016    INR 4.0 (H) 02/15/2016    APTT 63 (H) 02/15/2016          Vital Signs:      ? Cardiac output   ? Cardiac Index   Temp: 36.6 ?C (97.9 ?F) BP: 123/81 Pulse: 86 Resp: (!) 31 SpO2: 97 % on O2 Flow Rate (L/min): 2 L/min None (Room air)   Intake/Output:     CT OUTPUT: removed     URINE OUTPUT: 2100  +5 liters    Exam:  Neuro: movine all extremities equal strength  Cardiac: NSR no murmurs  Lungs;clear  ABDOMEN:abdomen is soft without significant tenderness, masses, organomegaly or guarding  EXTREMITIES 2+ edema in the lower extremities toes appear much better digital ischemia much improved    PLAN:   Neurologic: No issues      Cardiac: Pre-op EF:   EF 15% prior to surgery   Post op 30% with good valve function 23mm valve     1. Inotropes off   2. Pressors: off   3. Rhythm: NSR   AMIODARONE   4. Aspirin: holding    5. Beta-Blocker: carvedilol 6.25    6. Statin: atorvastatin    7. ACE/ARB: hold        Respiratory:    Smoker none   Pulmonary toilet    Xray: wet but improved  ABX stopped     Gastrointestinal:   Nutritional status not great       GI CONSULT NEEDED as outpatient     Genitourinary: creat: pre op 0.9   1.25>2.16>2.76>3.15>2.4>1.78>1.49>1.2>0.99>0.92  OFF CRRT 8/25 in AM    Infectious Disease:  WBC: 18>15>17>15>15>13.8>14    Hematology: HGB 7.1>7.2>6.7>6.6>8.9  PLTS 404 >461>425>398>390       hitt  ++++    started agatroban   Thrombocytopenia resolved on coumadin INR 3.2 today continue agatroban    Endocrine: Glucose management   Electrolytes: replacing Mg/K/Ca  Prophylaxis Argatroban PPI/H2 Blocker      SUMMARY    INR now 4.0 continue argatroban need two days of INR >4   Severe anemia secondary to blood loss from surgery and chronic disease  Lasix once per  day +5 liters kidney function improving    [employer]

## 2016-02-16 ENCOUNTER — Inpatient Hospital Stay: Admit: 2016-02-16 | Payer: MEDICAID

## 2016-02-16 LAB — RENAL FUNCTION PANEL
Albumin: 2.5 g/dL — ABNORMAL LOW (ref 3.5–5.0)
Anion Gap: 5.5 mmol/L (ref 3.0–11.0)
BUN: 13 mg/dL (ref 6–23)
CO2 - Carbon Dioxide: 24.5 mmol/L (ref 21.0–31.0)
Calcium: 8.6 mg/dL (ref 8.6–10.3)
Chloride: 112 mmol/L — ABNORMAL HIGH (ref 98–111)
Creatinine: 0.92 mg/dL (ref 0.55–1.10)
GFR Estimate: >60 mL/min/{1.73_m2} (ref >=60)
Glucose: 86 mg/dL (ref 80–99)
Phosphorus: 2.2 mg/dL — ABNORMAL LOW (ref 2.5–4.5)
Potassium: 3.3 mmol/L — ABNORMAL LOW (ref 3.5–5.1)
Sodium: 142 mmol/L (ref 135–143)

## 2016-02-16 LAB — POTASSIUM: Potassium: 4 mmol/L (ref 3.5–5.1)

## 2016-02-16 LAB — CBC WITH AUTO DIFFERENTIAL
Basophils %: 2 % (ref 0–2)
Basophils, Absolute: 0.2 10*3/ÂµL (ref 0.0–0.2)
Eosinophils %: 2 % (ref 0–7)
Eosinophils, Absolute: 0.2 10*3/ÂµL (ref 0.0–0.7)
HCT: 27.5 % — ABNORMAL LOW (ref 37.0–48.0)
Hemoglobin: 8.6 g/dL — ABNORMAL LOW (ref 12.0–16.0)
Lymphocytes %: 13 % — ABNORMAL LOW (ref 25–45)
Lymphocytes, Absolute: 1.1 10*3/ÂµL (ref 1.1–4.3)
MCH: 24.9 pg — ABNORMAL LOW (ref 27.0–34.0)
MCHC: 31.3 g/dL — ABNORMAL LOW (ref 32.0–36.0)
MCV: 79.5 fL — ABNORMAL LOW (ref 81.0–99.0)
MPV: 7.2 fL — ABNORMAL LOW (ref 7.4–10.4)
Monocytes %: 6 % (ref 0–12)
Monocytes, Absolute: 0.6 10*3/ÂµL (ref 0.0–1.2)
Neutrophils %: 77 % — ABNORMAL HIGH (ref 35–70)
Neutrophils, Absolute: 6.8 10*3/ÂµL (ref 1.6–7.3)
Platelet Count: 302 10*3/ÂµL (ref 150–400)
Platelet Estimate: NORMAL
RBC: 3.47 10*6/ÂµL — ABNORMAL LOW (ref 4.20–5.40)
RDW: 29.2 % — ABNORMAL HIGH (ref 11.5–14.5)
WBC: 8.9 10*3/ÂµL (ref 4.8–10.8)

## 2016-02-16 LAB — PROTIME-INR
INR: 4.2 — ABNORMAL HIGH (ref 0.9–1.2)
INR: 3.6 — ABNORMAL HIGH (ref 0.9–1.2)
INR: 3.8 — ABNORMAL HIGH (ref 0.9–1.2)
Protime: 48.4 Seconds — ABNORMAL HIGH (ref 10.0–13.0)
Protime: 41.1 Seconds — ABNORMAL HIGH (ref 10.0–13.0)
Protime: 43.5 Seconds — ABNORMAL HIGH (ref 10.0–13.0)

## 2016-02-16 LAB — APTT
APTT: 56 Seconds — ABNORMAL HIGH (ref 23–36)
APTT: 43 Seconds — ABNORMAL HIGH (ref 23–36)

## 2016-02-16 MED ORDER — potassium chloride SA (K-DUR,KLOR-CON) CR tablet 40 mEq
10 | Freq: Once | ORAL | Status: AC
Start: 2016-02-16 — End: 2016-02-16
  Administered 2016-02-16: 14:00:00 10 meq via ORAL

## 2016-02-16 MED ORDER — potassium chloride SA (K-DUR,KLOR-CON) CR tablet 20 mEq
10 | Freq: Once | ORAL | Status: AC
Start: 2016-02-16 — End: 2016-02-16
  Administered 2016-02-16: 18:00:00 10 meq via ORAL

## 2016-02-16 MED ORDER — warfarin on hold
ORAL | Status: DC
Start: 2016-02-16 — End: 2016-02-17

## 2016-02-16 MED FILL — FOLIC ACID 1 MG TABLET: 1 mg | ORAL | Qty: 1

## 2016-02-16 MED FILL — CARVEDILOL 6.25 MG TABLET: 6.25 mg | ORAL | Qty: 1

## 2016-02-16 MED FILL — AMIODARONE 200 MG TABLET: 200 mg | ORAL | Qty: 1

## 2016-02-16 MED FILL — ARGATROBAN 50 MG/50 ML (1 MG/ML) IN SODIUM CHLORIDE (ISO-OSMOTIC) IV: 50 mg/50 mL (1 mg/mL) | INTRAVENOUS | Qty: 50

## 2016-02-16 MED FILL — FAMOTIDINE 20 MG TABLET: 20 mg | ORAL | Qty: 1

## 2016-02-16 MED FILL — FUROSEMIDE 40 MG TABLET: 40 mg | ORAL | Qty: 1

## 2016-02-16 MED FILL — MIRALAX 17 GRAM ORAL POWDER PACKET: 17 g | ORAL | Qty: 1

## 2016-02-16 MED FILL — STOOL SOFTENER (DOCUSATE CALCIUM) 240 MG CAPSULE: 240 mg | ORAL | Qty: 1

## 2016-02-16 MED FILL — ATORVASTATIN 20 MG TABLET: 20 mg | ORAL | Qty: 1

## 2016-02-16 MED FILL — COUMADIN 5 MG TABLET: 5 mg | ORAL | Qty: 1

## 2016-02-16 MED FILL — VITAMIN C 500 MG TABLET: 500 mg | ORAL | Qty: 2

## 2016-02-16 MED FILL — KLOR-CON M10 MEQ TABLET,EXTENDED RELEASE: 10 meq | ORAL | Qty: 2

## 2016-02-16 MED FILL — AMIODARONE 200 MG TABLET: 200 mg | ORAL | Qty: 2

## 2016-02-16 MED FILL — KLOR-CON M10 MEQ TABLET,EXTENDED RELEASE: 10 meq | ORAL | Qty: 4

## 2016-02-16 NOTE — Progress Notes (Signed)
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 admitted on 01/23/2016 with a NSTEMI s/p R&L heart catheterization. PICC line placed 8/25. CRRT from 8/20-8/25. 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     Subject:  I spoke with the patient and explained pharmacist care.  Her husband and sister were also in the room when we discussed her INR and warfarin dosing strategy.  We went over the [facility] warfarin information and Coumadin pamphlet in detail, all questions were answered to their satisfaction.  She lives in [place] and had multiple questions about how to get a doctor (she has no PCP at present) and where to get medications.  Discharge planners have talked with them already and I re-emphasized their message.  She said that she is eating well, no bleeding issues identified.    Allergies: Heparin and Sulfa (sulfonamide antibiotics)  PMH:  has a past medical history of History of blood transfusion.   Active problems:   ? Heparin induced thrombocytopenia  ? S/P Aortic valve replacement     OBJECTIVE:     Height: 167.6 cm (66) Admit weight: 101.4 kg (223 lb 8.7 oz) Last charted weight: 107.7 kg (237 lb 8 oz) IBW: 59.3 kg Body mass index is 38.33 kg/(m^2).    Recent Labs      02/14/16   0539  02/15/16   0445  02/16/16   0517   NA  140  141  142   K  3.5  3.6  3.3*   CL  111  109  112*   CO2  22.1  23.0  24.5   GLU  88  100*  86   BUN  22  16  13    CREATININES  0.99  0.92  0.92   CALCIUM  8.6  8.6  8.6   ALBUMIN  2.3*  2.6*  2.5*     Recent Labs      02/14/16   0539  02/15/16   0445  02/16/16   0517   WHITEBLOODCE  13.8*  14.0*  8.9   NEUTROABS  11.2*  11.6*   --    BANDSABS  0.4  0.1   --    HGB  6.6*  8.9*  8.6*   HCT  22.6*  28.2*  27.5*   LABPLAT  370  345  302     Warfarin Dosing Record:  Date: 8/31 9/1 9/2 9/3 9/4 9/5 9/6   INR:3.6 2.9 3 4.1 2.8  3.2 4.0 4.2 @ 0517  3.6 @ 1055  3.8 @ 1530   Warfarin: 3 mg 3 mg 2.5 3 mg 5 mg 5 mg Hold     ASSESSMENT:     Estimated CrCl: 81 mL/min (CG AdjBW) per [employer]      Argatroban infusion:  Discontinued on 02/16/16  ? 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 Thrombosis in right lower extremity on 8/22  ? Argatroban at 0.9 mcg/kg/min - discontinued on 9/6 @ 0640  ? APTT = 63 (goal 50 - 90); INR 4.0 (goal 4 - 6 w/ concurrent warfarin + argatroban)  ? Warfarin and agratrogan overlapped for 7 days    Recent vitamin K administration: None noted in Epic   Warfarin Indication:  HIT  w/ thrombosis in RLE on 8/22; Atrial fibrillation   Goal INR: 2-3 (4-6 while on argatroban)   Today's INR: Therapeutic   Home warfarin dose: N/A   Additional anticoagulant/antiplatelet:  Argatroban 0.9 mcg/kg/min   Comments about potential drug, disease or dietary interactions? Amiodarone, argatroban, acetaminophen (0 mg/24 hrs)   Warfarin education status: Completed on 02/16/16     The patient was offered the [place] Coumadin Education Video; or the Spanish version. The patient was provide written warfarin (Coumadin) education materials to supplement verbal and video education.    The patient or representative verbalized understanding of the following:    1. Warfarin is used to prevent or treat blood clots by decreasing the clotting power of the blood.    2. Generic warfarin and brand Coumadin are the same drug. Patients may respond differently to brand & generic, so increased monitoring is required if swapped.    3. Warfarin is taken to prevent clotting associated with prosthetic heart valves, deep vein thrombosis, atrial fibrillation, or other indications.    4. Warfarin is taken once daily. Use a calendar or pill box to help remember each dose. The dosing regimen may vary from day to day. It is important to take the drug at the same time every day to maintain consistency for checking of  INR.    5. If a dose of warfarin is forgotten, but remembered within two to three hours it can be taken. If longer don't take the warfarin. Take the next dose when it is due and tell the doctor or laboratory.    6. It is importance to keeping a list of all current medications to allow complete and accurate information for health care providers.    7. Frequent blood checks (INR's) are necessary to make sure the correct dose is taken and to determine if dose adjustments are necessary.    8. The target INR level.      9. Warfarin can cause bleeding because it prevents the blood from clotting.    10. Significant signs of bleeding. Obvious bleeding - cuts, nosebleed, bleeding gums. Less obvious bleeding - urine, feces, vomit and coughing. Importance of seeking medical attention for serious signs of bleeding.    11. Warfarin may interact with many drugs including over the counter medications, vitamins and herbals. Notify a healthcare provider prior to taking any new prescription medications, over the counter medications and/or herbals.    12. Call a healthcare provider if any of the following symptoms are detected: diarrhea, vomiting, infection or fever, pain, swelling or discomfort. These may change the effectiveness of warfarin.    13. Foods, vitamins, herbals and alcohol may affect Coumadin therapy. Vitamin K directly opposes the effects of warfarin. It is important to stabilize intake of Vitamin K.  This includes green leafy vegetables. Maintain a well balanced and consistent diet - avoid crash dieting and binge eating. If taking vitamin or herbal supplements discuss with healthcare provider. Only drink alcohol in moderation.    14. It is important to seeking medical attention for serious falls, especially if the fall injures the head. Head injuries may cause bleeding in the brain.    15. Avoid contact sports and activities that may cause serious injury, which may lead to bleeding.    16. Contact a healthcare provider  if there are planned surgeries or other medical and dental procedures so arrangements can be made to adjust or stop the warfarin, and/or to take other precautions to prevent bleeding, and/or determine how  and when to restart after the surgery or procedure.    17. Carrying either an ID card or wearing an ID bracelet is recommended to alert healthcare workers that you are on warfarin in the event of an accident when you may not be able to speak for yourself.    18. Special arrangements may need to be made for blood monitoring during extended travel. All extended travel plans should be shared early with the healthcare provider.    PLAN:     Warfarin:  ? Holding warfarin tonight, INR remains supratherapeutic  ? Daily INR/PT ordered  ? Pharmacy to monitor and adjust dosing as needed    Signed:  Achille Rich, [facility]

## 2016-02-16 NOTE — Discharge Planning (AHS/AVS) (Signed)
[name] pre admission assessment completed, patient is working hard, agree good candidate for [employer].  Bed available on [name] as soon as tomorrow, once medically ready, working on English as a second language teacher.

## 2016-02-16 NOTE — Progress Notes (Signed)
End of Shift Report     Significant Events Pitting edema +2 bi lateral LE      Relevant   Assessment Findings No pain report, SR, +2 pitting edema, assist of 1 to bedside commode       Patient/Family  Questions/Concerns No concerns verbalized      DC Barriers Generalized weakness, plan, rehab

## 2016-02-16 NOTE — Treatment Summary (Signed)
IP PT TREATMENT NOTE     [name] / [id number] DOB:  1956-01-03 / 60 y.o. Date:  02/16/2016     Precautions  Specific mobility precautions: Sternal precautions  Sternal precautions: Standard  Required Braces/Orthotics: None  Other: .     ASSESSMENT     Summary:  Pt progressing well with therapy. Improving activity tolerance. CGA/min assist for transfers and gait. Should be an excellent candidate for [name]. Is very motivated and eager to improve her functional mobility.     Activity tolerance: Tolerates 30 min activity with multiple rests  Comments: SOB with exertion, cued for pursed lipped breathing     Precaution Awareness: Fully aware of precaution(s)  Deficit Awareness: Fully aware of deficits  Correction of Errors: Self-corrects with cues  Safety Judgment: Good awareness of safety       Discharge recommendations  Mobility Equipment needs: To be determined later   Primary recommendation : Patient would benefit from intensive rehabilitation and is expected to be able to participate in at least 3 hrs of treatment daily  Recommended transport method: Wheelchair  Secondary recommendation: Patient would benefit from SNF/Nursing Home/Other Skilled Facility.  Recommended transport method: Wheelchair  Justification: medically complex, far from previous baseline      PLAN     Treatment Plan Frequency    Plan: Continue per established POC.  Pt. demonstrates limitations which require skilled PT intervention.  1x (5 days/week).  Frequency will be increased as pt's condition or discharge plan indicates.     Rayana Geurin will remain on the Physical Therapy schedule until goals are met, or there has been a change in the Plan of Care.          Treatment Diagnosis Surgery / # Days Post-op (if applicable)    Primary Dx: Resp failure  Treatment Dx: altered mobility  Procedure(s) (LRB):  AORTIC VALVE REPLACEMENT; INTRA AORTIC BALLOON INSERTION (N/A) / 20 Days Post-Op       SUBJECTIVE   I want to get up and walk      Pain Level  Current status: Pain does not limit pt's ability to participate in therapy  Pain at start of session: 0 - No Pain  Pain at end of session: 3  Pain location: Foot  Pain descriptors: Sore     OBJECTIVE   Seen for OOB transfers and gait. Lab tech here for blood draw and pt sat EOB ~10 min prior to amb in hall. CGAamb with 4ww 150'. SOB with gait, but improved with standing rest break. Back to bed at end of session. Family in room     Mental status  General Demeanor: Pleasant;Cooperative  LOC: Alert  Orientation: Oriented x 4  Oriented to: Person;Place;Situation  Directions: Follows multi-step commands  --> Follows multi-step commands: With cues  --> Follows one-step commands: Consistently  Attention span: Appears intact  Memory: Appears intact in social/therapy situations   Medical appliances  Medical Appliances: IV;Telemetry       TREATMENT  Bed Mobility/Transfers  Bed mobility       Rolling / logroll  Rolling R: CGA      Transition to sitting up Sidelying to Sit: Mod Assist   To resting position      Transition to sitting down CGA     Transition to supine Mod Assist     Scooting     Transfers      Transition to standing From bed: Min Assist     Method  Gait/Stairs  Mobility - Gait (Level)  Gait (distance): 150 feet  Gait (assist level): CGA  Assistive device: Four wheeled walker  Endurance: fair; SOB with exertion  Pace: slow but steady  Pattern: Downward gaze;Wide BOS;Decreased step length L;Decreased step length R    Ther. Ex.  LE exercises: Ankle pumps;Heelslides;Quad sets;Seated long arc quads  Ankle pumps: 10  Heelslides: 5  Quad sets: 5  Seated long arc quads: 10  Addit. Exercises: seated hip flexion x10    Balance  Balance - Basic  Static sitting balance: Assist level;Position  Assist level: SBA  Position: Feet - supported  Time: 10  Static standing balance: Assist level  Assist level: SBA;CGA  Dynamic standing balance: Assist level  Assist level with weight shifts: Min assist      Training/Education  Trainee(s): Primary Learner  Primary Learner: Patient  Primary Learner - Barriers to Learning?: No  Primary Learner - Specific barriers: Physical  Training provided: sternal precautions, HEP; safety  Response to training: receptive   Safety/Room Set-up   Call button accessible?: Yes  Phone accessible?: Yes  Oxygen reconnected?: No, on room air  Position on arrival: In bed  Position on departure (bed): Bed alarm on;2 bedrails up  Comments: Back to bed with alarm     GOALS     Patient/Caregiver goals reviewed and integrated with rehab treatment plan:     Multidisciplinary Problems (Active)        Problem: Mobility: Ambulation    Goal Priority Disciplines Outcome   Gait (with precautions)     PT    Description:  Pt. to ambulate 50 feet with one person min assist, using FWW, following any pertinent precautions.    Gait Soil scientist)     PT    Description:               Problem: Mobility: Transfers    Goal Priority Disciplines Outcome   Supine to/from sitting (with precautions)     PT    Description:  Pt. to transfer to/from sitting with one person min assist,  following any pertinent precautions.    Sitting to/from standing (with precautions)     PT    Description:  Pt. to transfer sitting to/from standing with one person min assist, following any pertinent precautions.    Bed to/from chair (with precautions)     PT    Description:  Pt. to transfer bed to/from chair with one person min assist, following any pertinent precautions.           Problem: Patient/Family Goal    Goal Priority Disciplines Outcome   Normal Status     PT    Description:  Pt. wants to return to previous level of function.     Mobility     PT    Description:  Patient wants to walk again.           Problem: Strength    Goal Priority Disciplines Outcome   Home Exercise Program (with precautions)     PT    Description:  Pt. to perform basic HEP independently following any pertinent precautions.                   G-Codes/PSFS/FIM  Scores: (if applicable)          First session Second session (if applicable)    Start/Stop times: 1520 - 1557 Start/Stop times:   - Stop Time:     Total time: 27 minutes  Total time:  minutes     This note to serve as a Discharge Summary if Brandy [name] is discharged from the hospital or from therapy services.     Therapist:  Penelope Coop, PT

## 2016-02-16 NOTE — Treatment Summary (Signed)
SPEECH LANGUAGE PATHOLOGY DAILY NOTE - INPATIENT    Patient name: Brandy [name]  Room #: 1610-96  Date: 02/16/2016    ASSESSMENT   Activity Tolerance: good activity tolerance. Pt tolerating reg diet. She hd no throat pain.  Pt feels her swallow is at baseline. Intakes are very good.  Cognition was not addressed as SLP order was for swallow.   Solid Diet Level: regular texture, with self ordering (Diet advanced per physician)  Liquid Diet Level: thin/regular  Medication Administration: crushed, give with puree  Compensatory Swallowing Strategies: upright as possible for all oral intake, do not eat or drink less than 2 hours before bedtime, set-up required, assist as needed with meals, oral care per policy, monitor temperature and lung sounds, moisten food with sauces, gravy, etc., avoid dry, crumbly foods, avoid thick/sticky foods, monitor for fatigue, start meal with sip of liquid for moisture/lubrication, alternate bites and sips, effortful swallow, small bites/sips, straws safe         PLAN   No further SLP intervention indicated. Will discontinue SLP therapy. Should change in condition warrant reassessment, please order. Discharge Recommendations: no skilled SLP services expected at time of discharge (Cognitive function not assessed as order was for swallow)        SUBJECTIVE   Behavior/Cognition: alert;cooperative;pleasant mood;hardworking Patient Positioning: upright in bed Respiratory Status: room air Precautions/Complicating Factors: decreased endurance, generalized weakness        Family/Caregiver Present: no          Pain Score: 0 - No Pain       Bed alarm: continued         OBJECTIVE    . Treatment Objective: improve swallow               [name] made the following progress toward goals today:  Goals Objective Data   Goal 1: Pt will tolerate least restrictive p.o. diet consistencies with swallow WFL for safe and adequate nutrition/hydration Goal Met: Pt tolerated 2 saltines /c PB. She had no overt s/s of  aspiration, no oral residue and no voice change.     Goal 2: Patient will perform swallowing exercises with >80% accuracy with min cues/models.  Swallow at baseline per pt              Speech Language Pathologist: Brandy [name], CCC-SLP    Start/Stop Time: 1245 - 1315       Total Time: 30 minutes      SLP Treatment Charges  $Swallow Treatment ([id number]): 23-37 mins

## 2016-02-16 NOTE — Progress Notes (Signed)
CARDIOTHORACIC SURGERY CRITICAL CARE NOTE     Name: Brandy [name]  DOB: 05/19/1956 60 y.o.  MRN: [id number]  CSN: [id number]    Brandy [name] is a 60 y.o. female patient. Patient was admitted with Respiratory failure (CMS/HCC)  Who had Procedure(s):  AORTIC VALVE REPLACEMENT; INTRA AORTIC BALLOON INSERTION.  20 Days Post-Op    HPI: The patient is a 60 y.o. female with the below medical co-morbid conditions.    ?  1. Severe symptomatic aortic stenosis                        Ejection fraction 30%     Found on TEE intra-op to be 15%                        Normal coronaries                        Predominant symptom dyspnea                     2. Severe Pulmonary Hypertension  3. Mild to Moderate Mitral regurgitation  4. Morbid obesity                        Weight loss 50 pounds in the last one year  5. respiratory insufficiency  6. Non-ST elevation MI                        The demand ischemia  7. Iron deficiency anemia                        CT chest abdomen and pelvis reviewed no abdominal masses                        Likely AVM secondary to aortic stenosis not a good candidate for sedation and EGD or colonoscopy well aortic stenosis is so critical  ?  Patient's a 60 year old female who presented the [facility] in respiratory distress unable to breathe.  There are her troponin was elevated she underwent oxygen therapy and was given Lasix.  Cardiac workup showed normal coronary arteries with depressed LV function on echo and severe aortic stenosis valve area less than 0.4 velocity 4.4 m/s with an EF 30%.  She was transfused 2 units of PRBCs A [facility] hospital with a hemoglobin 8.8.  His immobility volume overload from heart failure  ?  Patient hasn't been to the doctor in years she hasn't taken any medications nor does she take good care of herself.  [place] she is unable to consistently take meds.  She likes to use and asked to [employer] wood to heat her house.    ?  After a few  days of Lasix patient states that she was breathing much better she is 60% better per her suggestion.  I had a long talk with the family about the disease course why she was getting short of breath and discussion of heart failure.  They understand that she is given a need aortic valve replacement she wishes to have tissue valve I do think she is a better surgical candidate and have her given her age and risk.  ?  She has very poor dentition    POD #1  Patient has  been stable on balloon 1:2   Methylene blue last night SVR much better   Coronary perfusion good   Today I am managing acute heart failure with management of the IABP.    POD #2  removal of the intra-aortic balloon pump renal ultrasound completed showing minimal flow to the kidneys  patient still making urine  POD #3 CRRT started  POD #4 CRRT running acid base more stable pressures much better off NEO and LEVOPHED improved making urine  POD#5 CRRT continues   Patient plt 25   Will check for DVT   Postoperative day #6 CRRT continues platelets now up to 32 treating for heparin-induced from cytopenia  Postoperative day #7 Trialysis Cath replaced secondary to malfunction.  On agatroban still hemodynamics improving oxygen requirements lower.  Postoperative day #8 making transition to hemodialysis   Postoperative day #9 +12 liters still  Will need HD likely     POD #10:  11.8 L positive. Planning hemodialysis today.  remains in A. Fib on the amiodarone protocol.  POD#11: 8 liters positive.  No other changes  POD#12: still in AFIB rates 110-130   Multiple amiodarone loads without success...digoxin not good option secondary to renal failure  POD#13 doing well  Cardioversion by Dr. Truddie Hidden  POD#14 no major issues   In NSR  POD#15 NSR doing well needs rehab   POD#16 NSR looked good with PT yesterday getting stronger ischemic toes feel much better   POD#17 NSR continues on argatroban INR 2.8  POD#18 NSR  INR 3.2  POD#19 NSR  INR 4.0   POD #20 sinus rhythm, INR 4.2;  hypokalemic 3.3; 24 hour urine 2475    Subjective: making progress    Assessment & Plan:     Active Hospital Problems    Diagnosis SNOMED CT(R) Date Noted   . Acute renal failure with tubular necrosis (CMS/HCC) ACUTE RENAL FAILURE SYNDROME 02/13/2016   . Heparin induced thrombocytopenia (CMS/HCC) HEPARIN-INDUCED THROMBOCYTOPENIA 02/02/2016   . S/P AVR (aortic valve replacement) HISTORY OF AORTIC VALVE REPLACEMENT 01/27/2016   . Respiratory failure (CMS/HCC) RESPIRATORY FAILURE 01/23/2016   . Aortic stenosis, severe AORTIC VALVE STENOSIS 01/22/2016   . NSTEMI (non-ST elevated myocardial infarction) (CMS/HCC) ACUTE NON-ST SEGMENT ELEVATION MYOCARDIAL INFARCTION 01/21/2016   . Demand ischemia of myocardium (CMS/HCC) ACUTE ISCHEMIC HEART DISEASE 01/21/2016   . Iron deficiency anemia IRON DEFICIENCY ANEMIA 01/21/2016   . Obesity OBESITY 01/21/2016       Scheduled Medications:   . albuterol  2.5 mg Nebulization 2 times daily   . amiodarone  400 mg Oral 2 times per day   . ascorbic acid (vitamin C)  1,000 mg Oral Daily   . atorvastatin  20 mg Oral Nightly   . carvedilol  6.25 mg Oral BID with meals   . docusate calcium  240 mg Oral 2 times per day   . famotidine  20 mg Oral 2 times per day   . folic acid  1 mg Oral Daily   . furosemide  40 mg Oral Daily   . ondansetron       . potassium chloride SA  20 mEq Oral Once   . warfarin  5 mg Oral Daily     Infusions:   . argatroban Stopped (02/16/16 0981)     Recent Labs:  Lab Results   Component Value Date    NA 142 02/16/2016    K 3.3 (L) 02/16/2016    K 3.69 01/23/2016    CL 112 (H)  02/16/2016    CL 104.1 01/23/2016    CO2 24.5 02/16/2016    CO2 20 (L) 01/23/2016    GLU 86 02/16/2016    GLU 85 01/23/2016    BUN 13 02/16/2016    CREATININES 0.92 02/16/2016    CALCIUM 8.6 02/16/2016    AST 26 02/04/2016    ALT 10 02/04/2016    ALKPHOS 121 (H) 02/04/2016    BILITOT 0.90 01/21/2016    ALBUMIN 2.5 (L) 02/16/2016    GLOB 3.0 02/04/2016    AGRATIO 0.9 (L) 02/04/2016    ANIONGAP 5.5  02/16/2016    ANIONGAP 21.0 (H) 01/23/2016    LABGLOM >60 02/16/2016    LABGLOM >60.0 01/23/2016    WHITEBLOODCE 8.9 02/16/2016    LABPLAT 302 02/16/2016    HGB 8.6 (L) 02/16/2016    PROTIME 48.4 (H) 02/16/2016    INR 4.2 (H) 02/16/2016    APTT 56 (H) 02/16/2016          Vital Signs:      ? Cardiac output   ? Cardiac Index   Temp: 37.1 ?C (98.7 ?F) BP: 128/72 Pulse: 90 Resp: 15 SpO2: 95 % on O2 Flow Rate (L/min): 2 L/min None (Room air)   Intake/Output:     CT OUTPUT: removed     URINE OUTPUT: 2100>2475  +5 liters    Exam:  Neuro: movine all extremities equal strength  Cardiac: NSR no murmurs  Lungs;clear  ABDOMEN:abdomen is soft without significant tenderness, masses, organomegaly or guarding  EXTREMITIES 2-3+ edema in the lower extremities toes appear much better digital ischemia much improved    PLAN:   Neurologic: No issues      Cardiac: Pre-op EF:   EF 15% prior to surgery   Post op 30% with good valve function 23mm valve     1. Inotropes off   2. Pressors: off   3. Rhythm: NSR   AMIODARONE   4. Aspirin: holding    5. Beta-Blocker: carvedilol 6.25    6. Statin: atorvastatin    7. ACE/ARB: hold        Respiratory:    Smoker none   Pulmonary toilet    Xray: wet but improved  ABX stopped     Gastrointestinal:   Nutritional status not great       GI CONSULT NEEDED as outpatient     Genitourinary: creat: pre op 0.9   1.25>2.16>2.76>3.15>2.4>1.78>1.49>1.2>0.99>0.92  OFF CRRT 8/25 in AM    Infectious Disease:  WBC: 18>15>17>15>15>13.8>14>8.9    Hematology: HGB 7.1>7.2>6.7>6.6>8.9>   PLTS 404 >461>425>398>345>302       hitt  ++++    started agatroban   Thrombocytopenia resolved   coumadin INR 3.2>4.0>4.2  discontuing agatroban today 02/16/16    Endocrine: Glucose management   Electrolytes: replacing Mg/K/Ca  Prophylaxis Argatroban PPI/H2 Blocker      SUMMARY    argatroban needed two days of INR >4   DC argatroban today  Severe anemia secondary to blood loss from surgery and chronic disease  Continue Lasix once per day +5  liters kidney function improving (0.92)  Remains 4 L +; 3+ LE edema; sat too long in chair yesterday  hypokalemic, 3.3      Brayton Caves, [place]

## 2016-02-16 NOTE — Discharge Planning (AHS/AVS) (Signed)
...    DISCHARGE PLANNING ASSESSMENT     Primary Care Physician: None Confirmed Pt's PCP and Contact info:   None     Plan:[name]    Patient/Family Goal:[name]  Family or Caregiver who is to be included in care planning: Husband, [name]  ASSESSMENT   Goals of Care conversation:   Full       Current General Observations/Barriers:Middle aged woman sitting up in bed, alert and oriented and wants to work with PT. Patient with severity of illness and length of stay, long post op recovery with continued anemia and low EF.     Prior Level of Function  Potential Risk Factors: Change in care needs, Severity of injury/illness  Mobility: Ambulatory, Independent     Support Systems/Services (Current)  Living Arrangements: Spouse/significant other  Type of Residence: Private residence  Support Systems: Spouse/significant other, Children, Family members, Friends/neighbors           INTERVENTIONS   Anticipated additional equipment needs: Environmental consultant or other as per [facility]  Do you anticipate patient will need medical transport at discharge?:No       Additional Information:Met with patient and husband, sister at bedside also. They are all in agreement with [name]. Husband is staying in [place] with sister. [name] looking to accept patient when medically ready.      Vassie Moselle, RN

## 2016-02-16 NOTE — Progress Notes (Addendum)
RESPIRATORY ASSESSMENT    SUBJECTIVE:   Patient states no oxygen, CPAP or any respiratory medications at home. Patient states her lungs  are  doing good Patient states she occasionally feels mucous in her chest but uses her pickle and then can get it up. Patient states she is going to rehab today.  OBJECTIVE:   Current Vital Signs:    Pre and post C/S: Diminished / clear with no change post treatment   HR:80     RR:  18              Cough:strong productive cough of small amount of thin white sputum   SpO2:   98% on room air   ABG Results: no recent results     CXR: FINDINGS:  The patient has had prior sternotomy. The heart is upper limits of   normal. Small pleural effusions and basal atelectasis are present. No   pneumothorax. Support lines and tubes are stable.  ?  ?  IMPRESSION:    Sternotomy and valve replacement.  ?  Stable chest findings.  ?    ASSESSMENT:    Feel bronchodilator therapy is no longer needed     PLAN:   2.5 mg Albuterol prn, push Acapella and I/S As needed    GOAL(S):  Medication:   Mobilize secretions if needed        Pulmonary/Bronchial Hygiene:  Improve mucocilliary clearance  Prevent or correct atelectasis        Supplemental Oxygen:   Maintain SpO2 > 92  %  Normalize and maintain an adequate SpO2/PaO2 appropriate for patient clinical situation    Per PDP # 102 and MD orders    E.S.  Margretta Ditty, RRT

## 2016-02-16 NOTE — Telephone Encounter (Signed)
PREADMISSION ASSESSMENT    Demographics  Brandy [name] Name: Brandy [name]  Date of Birth: 03-Nov-1955  Age: 60 y.o.   Gender: female    Referral Information:   Current Referral Location: [facility]     Room #: 3372  Date of Admit: 01/23/2016      Facility Contact Name: Lissa Merlin RN  Reason for Admit/Primary Medical Diagnosis: Respiratory failure, severe symptomatic aortic stenosis  Expected Reason for Rehab: Weakness, cardiac  Referring Physician: Dr. Otho Bellows  PCP: NO FAMILY PHYSICIAN,    Insurance Information:    Payor: MEDICAID / Plan: MEDICAID PLUS / Product Type: *No Product type* /      Prior Living Situation:   Lives with: spouse in [place]       Level: one      Steps: 4-5 steps with double railing  Employment Status: Not working  Contact Name: Tonishia Steffy    Relationship: Spouse    Phone Number: [phone]  Contact Name: Rozetta Nunnery    Relationship:  Sister   Phone Number: [phone]    Prior Level of Function:  Activity Level:    Prior ambulatory status: Walked without assistive device  Home/Community: Pt. was independent in home & community  Needed assist with : NA        Equipment:   Mobility Equipment: Front wheeled walker (FWW);Manual wheelchair   Fall History: None known    Chiropractor Indicators     Prior Current [name] Goal   Eating Indep   SBA Indep   Grooming Indep CGA   Indep   Bathing Indep Dep   Min   Dressing UE Indep     Max Min   Dressing LE Indep   Dep Min   Toileting Indep   Max Min   Transfer B/C Indep   Min Mod I   Transfer/Toilet Indep   Min Mod I   Walk Indep   CGA Mod I   W/C NA   NT NA   Stairs Indep   NT SBA       Bladder Continent   Bowel Bowel meds   Cognition A/O x4   Swallowing Improved, advanced to regular diet   Communication No impairment     Current Therapies in Progress and Status  Physical Therapy:  Functional Impairments: Decreased mobility, decreased strength and endurance  Participation/ Progress: Per PTA progress note of  02/16/2016:  Summary:  Pt improving daily, motivated, works hard.  (pt couldn't stand less than a week ago).  Edema is limiting somewhat in legs, consider tubi grip stockings)  Primary recommendation : Brandy [name] would benefit from intensive rehabilitation and is expected to be able to participate in at least 3 hrs of treatment daily  Remaining needs in [facility]: transfers, gait, sternal precautions, strength and endurance, Brandy [name]/ caregiver training    Occupational Therapy:  Functional Impairments: Decreased self care, decreased strength and endurance  Participation/ Progress: Per OT progress note of 02/16/2016:  Summary:  Pt fatigued from events from the day, though willing to work with OT and remains highly motivated. Pt required mod A supine <> EOB with v/c's and min A with cues to adhere to sternal precautions with sit <> stand transfers from bed and toilet. Pt continues to require sitting resting break after functional mobility to bathroom and completing ADL's standing at sink. Pt currently requiring max A for toileting. Pt completed B UE exercises in standing reaching for target and shoulder height, x10 each UE twice  with sitting resting break between sets. Continuing to recommend [facility] for increased strength, endurance and independence with ADL's and daily tasks.?  Primary recommendation : Brandy [name] would benefit from intensive rehabilitation and is expected to be able to participate in at least 3 hrs of treatment daily  Justification: Continuing to recommend [facility] for intensive rehab to address decreased strength, endurance and functional performace.   Remaining needs in [facility]: ADL training, functional mobility, sternal precautions, strengthening and endurance, Brandy [name]/ caregiver training    Speech Therapy:  Functional Impairments: Dysphagia, improved  Participation/ Progress: Per SLP progress note of 02/11/2016:  Pt declined any PO trials .She stated she had one strawberry for lunch and appeared to drink about 30%  of her chocolate shake for dinner.?  Progress has been very slow d/t pt decreased appetite. She states she is not hungry and has no cravings.  Brandy [name] was subsequently upgraded to regular diet by physician.   Remaining needs in [facility]: Re eval swallowing status if indicated    Current Equipment: Other: FWW  Current Precautions: Fall and Sternal Precaut.  Diet: Regular, Sodium 3 Gm, Heart Healthy  Isolation: No    History of Present Illness Resulting in Need for Rehab:   Per Dr. Tinnie Gens Solomon's consult of 02/15/2016:  The Brandy [name] is a 60 year-old Brandy [name] who was admitted with acute cardio-pulmonary failure, was found to have severe aortic stenosis for which she required aortic valve replacement.  Required intra-aortic baloon pump initially.  She had a intra-operative ejection fraction of only 15% (but now estimated at 30-35%).  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  She did have hematochezia on 8/26.    Per surgical note: Brandy [name]'s a 60 year old female who presented the [facility] falls in respiratory distress unable to breathe, troponin was elevated, she underwent oxygen therapy and was given Lasix. ?Cardiac workup showed normal coronary arteries with depressed LV function on echo and severe aortic stenosis valve area less than 0.4 velocity 4.4 m/s with an EF 30%. ?She was transfused 2 units of PRBCs A [facility] hospital with a hemoglobin 8.8.   Brandy [name] hasn't been to the doctor in years she hasn't taken any medications nor does she take good care of herself. [place] part of [place] she is unable to consistently take meds.  Functionally she is now able to walk about 30 feet with rest brakes, contact-guard assist. Worked well with OT but was quite impaired with transfers. Nevertheless, OT recs [name].   RT notes this am  reveals improving pulmonary function and clearing.   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  Today she is supratherapeutic on coumadin.   Had renal failure, was on CRRT, now improved.    She does have ischemic digits, probably from levofed and circulatory collapse.    Has received amiodarone for atrial fib, and also had successful cardioversion.   Also CRRT.  Now her renal function is normalized.  Has persistently elevated WBC count.  Her anemia is better status-post blood transfusion. ?  Had a FEES swallow evaluation, she did have some episodes of aspiration with thin liquids and some persistent residual in vallecula.  She is being cautiously advanced to dysphagia-pureed diet with think liquids, but careful monitoring.  ?  WOCN notes reviewed as well, she has ischemic right index finger and right 3  rd toe as well as several pads on her toes.     Recent Surgical Procedures:   . PROCEDURE N/A 01/27/2016   ? Procedure: AORTIC VALVE REPLACEMENT; INTRA AORTIC BALLOON INSERTION;  Surgeon: Otho Bellows, MD;  Location: [facility] OR;  Service: Cardiology;  Laterality: N/A;     Past Medical History:   Past Medical History:   Diagnosis Date   . History of blood transfusion      Past Surgical History:   . TONSILLECTOMY ? ?   . TUBAL LIGATION ? ?   . TUBAL LIGATION ? 1980   ?  Refer to physiatrist consult: Dr. Georga Bora dated: 02/15/2016    CURRENT CONDITIONS REQUIRING INPATIENT REHABILITATION (Current medical/functional conditions that require frequent, direct, ongoing, medically necessary direction from a rehab physician to ensure safe return of the Brandy [name] to the community.)    Medical Condition:   Active Hospital Problems   ? Diagnosis SNOMED CT(R) Date Noted   . Acute renal failure with tubular necrosis (CMS/HCC) ACUTE RENAL FAILURE SYNDROME 02/13/2016   . Heparin induced thrombocytopenia (CMS/HCC) HEPARIN-INDUCED THROMBOCYTOPENIA 02/02/2016   . S/P AVR (aortic valve  replacement) HISTORY OF AORTIC VALVE REPLACEMENT 01/27/2016   . Respiratory failure (CMS/HCC) RESPIRATORY FAILURE 01/23/2016   . Aortic stenosis, severe AORTIC VALVE STENOSIS 01/22/2016   . NSTEMI (non-ST elevated myocardial infarction) (CMS/HCC) ACUTE NON-ST SEGMENT ELEVATION MYOCARDIAL INFARCTION 01/21/2016   . Demand ischemia of myocardium (CMS/HCC) ACUTE ISCHEMIC HEART DISEASE 01/21/2016   . Iron deficiency anemia IRON DEFICIENCY ANEMIA 01/21/2016   . Obesity OBESITY 01/21/2016   ?  Interventions Required: Needs close medical supervision of cardiopulmonary status, heart rate/ rhythm, monitoring of labs, renal function, fluid status, skin integrity, pain control, anticoagulation mgmt    Potential Risk for Medical/Clinical Complications: Risk of cardiopulmonary compromise, abnormal heart rate/ rhythm, worsening anemia, worsening renal function, fluid/ electrolyte imbalance, pain, skin breakdown, DVT/ PE, falls and injury    Intensive Rehabilitation Nursing Needs  ADL, Bowel Mgmt, Disease Mgmt, Family Training/Education, Medication Administration, Pain Mgmt, Brandy [name] Education, Positioning, Respiratory/Airway Mgmt, Safety, Skin Integrity, Transfers and Wound Care  Comments:  Needs close monitoring of vital signs, O2 sats, cardiac and respiratory status, circulation, edema, labs, pain control, skin integrity, bowel mgmt.  Reinforce sternal precautions, safety and fall prevention, and mobility and self care training program to build strength and endurance.    Barriers to Discharge: Impaired mobility and self care, decreased strength and endurance, 5 steps into home  Anticipated Discharge Destination: Home with spouse  Anticipation Post-Discharge Needs: Follow up home health PT/OT/RN, DME as indicated    Justification:   Medically stable   Functional deficits require intensive program of rehabilitative therapy consisting of:    PT hrs/day 1.5, days/week 5   OT hrs/day 1.5, days/week 5  Requires interdisciplinary  coordination of care   Is expected to be able to participate in intensive therapy, with rest breaks  Reasonable likelihood of successful functional gains within 21 days    Recommendation:   60 year old female admitted on 08/13 with respiratory failure and found to have severe symptomatic aortic stenosis, she is status post aortic valve replacement on 08/17 with multiple medical complications resulting in prolonged hospital course.  She has weakness and deconditioning, decreased mobility and self care.  Will benefit from intensive PT and OT to improve strength, endurance and independence with mobility and self care, for functional mobility training, ADL training, stair training, Brandy [name]/ caregiver training.  Needs close medical supervision of cardiopulmonary status,  heart rate/ rhythm, monitoring of labs, renal function, fluid status, skin integrity, pain control, anticoagulation mgmt.  24 hour rehab nursing for close monitoring of vital signs, O2 sats, cardiac and respiratory status, circulation, edema, labs, pain control, skin integrity, bowel mgmt, and for carryover of sternal precautions, safety and fall prevention, and mobility and self care training program to build strength and endurance.    Accept for comprehensive rehabilitation    Discussed with Dr. Georga Bora on 02/16/2016.   Admissions Coordinator: Carlton Adam RN [facility]     Physician Review & Recommendations:           Reviewed and agreed    Address:  1. Expectation of measurable improvement that is of practical value in terms of functional capacity or adaptation to impairments.  2. Assessment of ability to participate (endurance/motivation).  3. Need for rehabilitation physician  4. Need for coordinated care plan and coordinated, team approach to care planning.  5. Need for intensive services in a rehab setting.  6. Expected LOS.

## 2016-02-16 NOTE — Treatment Summary (Signed)
IP OT TREATMENT NOTE    [name] / [id number] DOB:  1956-02-01 / 60 y.o. Date:  02/16/2016     Precautions  Specific mobility precautions: Sternal precautions  Sternal Precautions: Standard     ASSESSMENT     Summary:  Pt fatigued from events from the day, though willing to work with OT and remains highly motivated. Pt required mod A supine <> EOB with v/c's and min A with cues to adhere to sternal precautions with sit <> stand transfers from bed and toilet. Pt continues to require sitting resting break after functional mobility to bathroom and completing ADL's standing at sink. Pt currently requiring max A for toileting. Pt completed B UE exercises in standing reaching for target and shoulder height, x10 each UE twice with sitting resting break between sets. Continuing to recommend [name] for increased strength, endurance and independence with ADL's and daily tasks.      Activity tolerance: Tolerates 30 min activity with multiple rests  Unable to Tolerate Activity: Excessive muscle fatigue     Correction of Errors: Self-corrects with cues  Safety Judgment: Good awareness of safety     Consult recommendations      Discharge recommendations  Mobility Equipment needs: To be determined later   Primary recommendation : Patient would benefit from intensive rehabilitation and is expected to be able to participate in at least 3 hrs of treatment daily  Justification: Continuing to recommend [name] for intensive rehab to address decreased strength, endurance and functional performace.         PLAN     Treatment Plan Frequency    Continue per established POC.  Pt. demonstrates limitations which require skilled OT intervention.  1x (5 days/week) Frequency will be increased as pt's condition or discharge plan indicates.     Brandy [name] will remain on the Occupational Therapy schedule until goals are met, or there has been a change in the Plan of Care.          Treatment Diagnosis Surgery / # Days Post-op (if  applicable)    Primary Dx: Respiratory failure  Procedure(s) (LRB):  AORTIC VALVE REPLACEMENT; INTRA AORTIC BALLOON INSERTION (N/A) / 20 Days Post-Op       SUBJECTIVE   Pt agreeable to OT treatment. Pt's sister and spouse present at bedside. Pt had several questions of what [name] would look like, pt appreciative of answers to questions. Pt stating she was fatigued from series of events from the day, but willing to work with therapy and highly motivated.      Pain Level  Current status: Pain somewhat limits pt's ability to participate in therapy  Pain location: Buttocks;Back  Wong-Baker FACES: 2 - Hurts little bit     OBJECTIVE   ADL's with focus on increased independence and activity tolerance for tasks.      Mental status  Mental Status  General Demeanor: Pleasant;Cooperative  LOC: Alert  Directions: Follows one-step commands  --> Follows one-step commands: Consistently  Attention span: Appears intact  Memory: Appears intact in social/therapy situations  Cognition  Initiation: Appears intact   TREATMENT  Bed Mobility/Transfers  Bed mobility       Rolling / logroll         Transition to sitting up Supine to Sit: Mod Assist   To resting position      Transition to sitting down CGA     Transition to supine Mod Assist     Scooting  Transfers      Transition to standing From bed: Min Assist;Verbal cues  From toilet: Min Assist;Verbal cues     Method        ADLs  Where assessed: Standing at the sink;Other (comment) (Bathroom )  Grooming: CGA;Needs guidance for safety  Toileting: Max Assist  Lower body dress: Dependent (Dependent to don tubigrip)   Training/Education  Trainee(s): Primary Learner  Primary Learner: Patient  Primary Learner - Barriers to Learning?: Yes  Primary Learner - Specific barriers: Physical  Training provided: ADL's activity tolerance, d/c of [name]  Response to training: Receptive    Safety/Room set-up   Call button accessible?: Yes  Position on arrival: In bed  Position on departure (bed): Bed alarm  on;Bed in lowest position;Sitter/Family present     GOALS     Patient/Caregiver goals reviewed and integrated with rehab treatment plan:     Multidisciplinary Problems (Active)        Problem: ADLs    Goal Priority Disciplines Outcome   ADL Activity Tolerance for Self Care     OT    Description:  Pt  to tolerate 15  minutes of self care activity in seated position    Caregiver     OT    Description:  Caregiver will demonstrate the ability to assist with patient's care, as needed.           Problem: Edema    Goal Priority Disciplines Outcome   Stiffness/Joint mobility     OT    Description:  Pt's edema will be managed to prevent long term stiffness and loss of joint mobility with ROM, elevation, etc.             Problem: IP Post Cardiac Goal List    Goal Priority Disciplines Outcome   Self-Care UE     OT    Description:  Patient to perform  upper self-care, following sternal precautions with min assistance.    Self-Care LE     OT    Description:  Patient to perform lower body self-care with ,min assistance, except TED hose.      Toilet Transfers     OT    Description:  Patient to be able to complete modified toilet transfers with min assistance.     Bath/Shower Transfers     OT    Description:  Patient to perform  modified shower transfers with  min assist.    Precautions     OT    Description:  Patient to follow sternal  precautions, while performing ADLs.           Problem: ROM    Goal Priority Disciplines Outcome   AROM     OT    Description:  Patient to increase movement in B UE joint(s) to [facility].             Problem: Safety    Goal Priority Disciplines Outcome   Safety DME     OT    Description:  Pt/Caregiver to demonstrate awareness of DME needs for safety upon discharge.           Problem: Strength    Goal Priority Disciplines Outcome   Functional Strength     OT    Description:  Patient will use both UEs bilaterally, in self- care/ADLs.      Fine Motor Tasks     OT    Description:  Pt. to be able to use upper  extremities for fine motor  tasks.                G-Codes/PSFS/FIM Scores: (if applicable)          First session Second session (if applicable)    Start/Stop times: 1342 - 1434 Start/Stop times:   - Stop Time:     Total time: 52 minutes  Total time:   minutes     This note to serve as a Discharge Summary if Doneta Bayman is discharged from the hospital or from therapy services.     Therapist:  Werner Lean, OT

## 2016-02-16 NOTE — Other (Signed)
IP PT TREATMENT NOTE (NOT SEEN)    [name] DOB:  05/09/1956 / 60 y.o. Date:  02/16/2016     ASSESSMENT     No treatment provided.  Brandy [name] was not seen for the following reason:    Procedure/unavailable: Cardiac teaching.  Will see pt in pm.       PLAN     Brandy [name] will remain on the Physical Therapy schedule until goals are met, or there has been a change in the Plan of Care.          Treatment Diagnosis Surgery / # Days Post-op (if applicable)    Primary Dx: Resp failure  Treatment Dx: altered mobility  Procedure(s) (LRB):  AORTIC VALVE REPLACEMENT; INTRA AORTIC BALLOON INSERTION (N/A) / 20 Days Post-Op       SUBJECTIVE        OBJECTIVE   NA          Session times   Start/Stop Times:   -     Total Time:   minutes      This note to serve as a Discharge Summary if Brandy [name] is discharged from the hospital or from therapy services.     Therapist:  Humphrey Rolls, PTA

## 2016-02-16 NOTE — Progress Notes (Signed)
Post AVR education was provided for [facility] and her husband and sister. All participated in the group class and demonstrated a solid understanding of the material.

## 2016-02-17 ENCOUNTER — Inpatient Hospital Stay
Admit: 2016-02-17 | Discharge: 2016-02-26 | Disposition: A | Discharge status: Home / Self Care | Payer: MEDICAID | Source: Other Acute Inpatient Hospital | Attending: DO | Admitting: DO

## 2016-02-17 DIAGNOSIS — R2689 Other abnormalities of gait and mobility: Secondary | ICD-10-CM

## 2016-02-17 LAB — CBC WITH AUTO DIFFERENTIAL
Basophils %: 0 % (ref 0–2)
Basophils, Absolute: 0 10*3/ÂµL (ref 0.0–0.2)
Eosinophils %: 1 % (ref 0–7)
Eosinophils, Absolute: 0.1 10*3/ÂµL (ref 0.0–0.7)
HCT: 26.9 % — ABNORMAL LOW (ref 37.0–48.0)
Hemoglobin: 8.7 g/dL — ABNORMAL LOW (ref 12.0–16.0)
Lymphocytes %: 13 % — ABNORMAL LOW (ref 25–45)
Lymphocytes, Absolute: 1 10*3/ÂµL — ABNORMAL LOW (ref 1.1–4.3)
MCH: 26 pg — ABNORMAL LOW (ref 27.0–34.0)
MCHC: 32.5 g/dL (ref 32.0–36.0)
MCV: 79.8 fL — ABNORMAL LOW (ref 81.0–99.0)
MPV: 7.4 fL (ref 7.4–10.4)
Monocytes %: 5 % (ref 0–12)
Monocytes, Absolute: 0.4 10*3/ÂµL (ref 0.0–1.2)
Neutrophils %: 81 % — ABNORMAL HIGH (ref 35–70)
Neutrophils, Absolute: 6.1 10*3/ÂµL (ref 1.6–7.3)
Platelet Count: 240 10*3/ÂµL (ref 150–400)
Platelet Estimate: NORMAL
RBC: 3.37 10*6/ÂµL — ABNORMAL LOW (ref 4.20–5.40)
RDW: 30.7 % — ABNORMAL HIGH (ref 11.5–14.5)
WBC: 7.5 10*3/ÂµL (ref 4.8–10.8)

## 2016-02-17 LAB — COMPREHENSIVE METABOLIC PANEL
ALT - Alanine Aminotransferase: 20 IU/L (ref 7–52)
AST - Aspartate Aminotransferase: 25 IU/L (ref 8–39)
Albumin/Globulin Ratio: 0.8 — ABNORMAL LOW (ref >0.9)
Albumin: 2.3 g/dL — ABNORMAL LOW (ref 3.5–5.0)
Alkaline Phosphatase: 88 IU/L (ref 34–104)
Anion Gap: 7.4 mmol/L (ref 3.0–11.0)
BUN: 10 mg/dL (ref 6–23)
Bilirubin Total: 0.7 mg/dL (ref 0.3–1.2)
CO2 - Carbon Dioxide: 22.6 mmol/L (ref 21.0–31.0)
Calcium: 8.5 mg/dL — ABNORMAL LOW (ref 8.6–10.3)
Chloride: 111 mmol/L (ref 98–111)
Creatinine: 0.79 mg/dL (ref 0.55–1.10)
GFR Estimate: >60 mL/min/{1.73_m2} (ref >=60)
Globulin: 2.9 g/dL (ref 2.2–3.7)
Glucose: 111 mg/dL — ABNORMAL HIGH (ref 80–99)
Potassium: 3.4 mmol/L — ABNORMAL LOW (ref 3.5–5.1)
Protein Total: 5.2 g/dL — ABNORMAL LOW (ref 6.0–8.0)
Sodium: 141 mmol/L (ref 135–143)

## 2016-02-17 LAB — RENAL FUNCTION PANEL
Albumin: 2.3 g/dL — ABNORMAL LOW (ref 3.5–5.0)
Anion Gap: 7.4 mmol/L (ref 3.0–11.0)
BUN: 10 mg/dL (ref 6–23)
CO2 - Carbon Dioxide: 22.6 mmol/L (ref 21.0–31.0)
Calcium: 8.5 mg/dL — ABNORMAL LOW (ref 8.6–10.3)
Chloride: 111 mmol/L (ref 98–111)
Creatinine: 0.79 mg/dL (ref 0.55–1.10)
GFR Estimate: >60 mL/min/{1.73_m2} (ref >=60)
Glucose: 111 mg/dL — ABNORMAL HIGH (ref 80–99)
Phosphorus: 1.8 mg/dL — ABNORMAL LOW (ref 2.5–4.5)
Potassium: 3.4 mmol/L — ABNORMAL LOW (ref 3.5–5.1)
Sodium: 141 mmol/L (ref 135–143)

## 2016-02-17 LAB — APTT: APTT: 38 Seconds — ABNORMAL HIGH (ref 23–36)

## 2016-02-17 LAB — POTASSIUM: Potassium: 4 mmol/L (ref 3.5–5.1)

## 2016-02-17 LAB — PROTIME-INR
INR: 2.8 — ABNORMAL HIGH (ref 0.9–1.2)
Protime: 31.5 Seconds — ABNORMAL HIGH (ref 10.0–13.0)

## 2016-02-17 MED ORDER — famotidine (PEPCID) tablet 20 mg
20 | Freq: Two times a day (BID) | ORAL | Status: DC
Start: 2016-02-17 — End: 2016-02-26
  Administered 2016-02-18 – 2016-02-26 (×18): 20 mg via ORAL

## 2016-02-17 MED ORDER — acetaminophen (TYLENOL) tablet 650 mg
325 | ORAL | Status: DC | PRN
Start: 2016-02-17 — End: 2016-02-17

## 2016-02-17 MED ORDER — bisacodyl (DULCOLAX) suppository 10 mg
10 | Freq: Every day | RECTAL | Status: DC | PRN
Start: 2016-02-17 — End: 2016-02-26

## 2016-02-17 MED ORDER — potassium chloride SA (K-DUR,KLOR-CON) CR tablet 40 mEq
10 | Freq: Once | ORAL | Status: AC
Start: 2016-02-17 — End: 2016-02-17
  Administered 2016-02-17: 14:00:00 10 meq via ORAL

## 2016-02-17 MED ORDER — carvedilol (COREG) tablet 6.25 mg
3.125 | Freq: Two times a day (BID) | ORAL | Status: DC
Start: 2016-02-17 — End: 2016-02-26
  Administered 2016-02-18 – 2016-02-26 (×18): 3.125 mg via ORAL

## 2016-02-17 MED ORDER — atorvastatin (LIPITOR) tablet 20 mg
20 | Freq: Every evening | ORAL | Status: DC
Start: 2016-02-17 — End: 2016-02-26
  Administered 2016-02-18 – 2016-02-26 (×9): 20 mg via ORAL

## 2016-02-17 MED ORDER — amiodarone (PACERONE) tablet 400 mg
200 | Freq: Two times a day (BID) | ORAL | Status: DC
Start: 2016-02-17 — End: 2016-02-19
  Administered 2016-02-18 – 2016-02-19 (×3): 200 mg via ORAL

## 2016-02-17 MED ORDER — oxyCODONE (ROXICODONE) tablet 5-10 mg
5 | ORAL | Status: DC | PRN
Start: 2016-02-17 — End: 2016-02-26

## 2016-02-17 MED ORDER — potassium chloride SA (K-DUR,KLOR-CON) CR tablet 20 mEq
10 | Freq: Every day | ORAL | Status: DC
Start: 2016-02-17 — End: 2016-02-26
  Administered 2016-02-18 – 2016-02-26 (×9): 10 meq via ORAL

## 2016-02-17 MED ORDER — simethicone (MYLICON) chewable tablet 80 mg
80 | Freq: Four times a day (QID) | ORAL | Status: DC | PRN
Start: 2016-02-17 — End: 2016-02-26

## 2016-02-17 MED ORDER — acetaminophen (TYLENOL) tablet 650 mg
325 | ORAL | Status: DC | PRN
Start: 2016-02-17 — End: 2016-02-26
  Administered 2016-02-23 – 2016-02-25 (×3): 325 mg via ORAL

## 2016-02-17 MED ORDER — polyethylene glycol (MIRALAX) packet 17 g
17 | Freq: Every day | ORAL | Status: DC | PRN
Start: 2016-02-17 — End: 2016-02-26

## 2016-02-17 MED ORDER — bisacodyl (DULCOLAX) EC tablet 10 mg
5 | Freq: Every day | ORAL | Status: DC | PRN
Start: 2016-02-17 — End: 2016-02-26
  Administered 2016-02-22: 08:00:00 5 mg via ORAL

## 2016-02-17 MED ORDER — warfarin (COUMADIN) tablet 4 mg
2 | Freq: Every day | ORAL | Status: DC
Start: 2016-02-17 — End: 2016-02-17

## 2016-02-17 MED ORDER — potassium chloride SA (K-DUR,KLOR-CON) CR tablet 20 mEq
10 | Freq: Once | ORAL | Status: AC
Start: 2016-02-17 — End: 2016-02-17
  Administered 2016-02-17: 16:00:00 10 meq via ORAL

## 2016-02-17 MED ORDER — warfarin (COUMADIN) tablet 3 mg
2 | Freq: Every day | ORAL | Status: DC
Start: 2016-02-17 — End: 2016-02-18
  Administered 2016-02-18: 01:00:00 1 mg via ORAL

## 2016-02-17 MED ORDER — aspirin EC tablet 81 mg
81 | Freq: Every day | ORAL | Status: DC
Start: 2016-02-17 — End: 2016-02-26
  Administered 2016-02-18 – 2016-02-26 (×9): 81 mg via ORAL

## 2016-02-17 MED ORDER — furosemide (LASIX) tablet 40 mg
40 | Freq: Every day | ORAL | Status: DC
Start: 2016-02-17 — End: 2016-02-26
  Administered 2016-02-18 – 2016-02-26 (×9): 40 mg via ORAL

## 2016-02-17 MED ORDER — albuterol (PROVENTIL) nebulizer solution 2.5 mg
2.5 | RESPIRATORY_TRACT | Status: DC | PRN
Start: 2016-02-17 — End: 2016-02-18

## 2016-02-17 MED FILL — FAMOTIDINE 20 MG TABLET: 20 mg | ORAL | Qty: 1

## 2016-02-17 MED FILL — ATORVASTATIN 20 MG TABLET: 20 mg | ORAL | Qty: 1

## 2016-02-17 MED FILL — FUROSEMIDE 40 MG TABLET: 40 mg | ORAL | Qty: 1

## 2016-02-17 MED FILL — AMIODARONE 200 MG TABLET: 200 mg | ORAL | Qty: 2

## 2016-02-17 MED FILL — VITAMIN C 500 MG TABLET: 500 mg | ORAL | Qty: 2

## 2016-02-17 MED FILL — FOLIC ACID 1 MG TABLET: 1 mg | ORAL | Qty: 1

## 2016-02-17 MED FILL — CARVEDILOL 6.25 MG TABLET: 6.25 mg | ORAL | Qty: 1

## 2016-02-17 MED FILL — STOOL SOFTENER (DOCUSATE CALCIUM) 240 MG CAPSULE: 240 mg | ORAL | Qty: 1

## 2016-02-17 MED FILL — KLOR-CON M10 MEQ TABLET,EXTENDED RELEASE: 10 meq | ORAL | Qty: 4

## 2016-02-17 MED FILL — KLOR-CON M10 MEQ TABLET,EXTENDED RELEASE: 10 meq | ORAL | Qty: 2

## 2016-02-17 NOTE — Progress Notes (Signed)
Patient transferred to [facility]. VSS; SR 80's with First degree AV block this morning.   Report given to Dorene Sorrow, RN at [facility].  Patient's belongings were sent with the patient and her husband to [facility].

## 2016-02-17 NOTE — Progress Notes (Signed)
Pt not referred to cardiac rehab due to distance from home.

## 2016-02-17 NOTE — Progress Notes (Addendum)
End of Shift Report     Significant Events Potassium 3.4 this am. Replaced; redraw at 1100     Relevant   Assessment Findings No pain. NSR. 2+ BLE.       Patient/Family  Questions/Concerns No concerns this shift     DC Barriers None; [name] bed ready today; needs medical clearance, pacing wires out.      Hourly Rounding  ??  The health care team including RN, CNAll rounded per policy including:   ? safety checks  ? patient satisfaction  ??  This ensured patient expectations for excellent care, appropriate goals were set and assessed then updated.  ??  Purposeful Rounding included: bedside RN handoff report, medication pass and education, pain assessment, potty check, position/comfort check. General Safety of patient/room/bed/rest room/environmental. Plan of Care to include: labs,tests,discharge plan and follow up.    RELATE -- Reassure, Explain, Listen, Answer, Take Action, Express appreciation.

## 2016-02-17 NOTE — Progress Notes (Signed)
Nutrition Follow-Up    Brandy [name] is a 60 y.o. female, DOB 1955-06-25    AORTIC VALVE REPLACEMENT; INTRA AORTIC BALLOON INSERTION.  21 Days Post-Op    Subjective:  9/7:  Per PA noted dated 9/7, pt is feeling well and making good but slow progress.  Plan to move to [facility] bed.    9/4:  Recent intake values reflect: 50-75% meals. Pt reports she feels she is starting to eat more. Pt states she likes the strawberry smoothies with Beneprotein and chocolate CIB shakes and would like to continue these. Ordered some lunch items for pt.     Admit Anthropometric Measurements:   Ht: 167.6 cm (66)        Admit Wt: 101.4 kg (224 lbs)  IBW: 59.09 kg (130 lbs)   %IBW: 172%  BMI: 36.1 (no edema)   Ht/wt status:obese class 2   Documented EXTREMITIES 2-3+ edema in the lower extremities toes appear much better digital ischemia much improved    Today's Weight:  103.4 kg (228 lb) Body mass index is 36.8 kg/(m^2).  Weight Change:  Gin of 4 lbs per Documented Weights (last 14 days) since Admit Weight on 01/23/16              Nutrition-Focused Physical Findings:  Patient appears well-nourished and well-hydrated     Poor dentition per physician note   Nutrition risk factors: Current morbidity   Risk for nutritional deficit:moderate     Food and Nutritional Intake:  Diet Orders:   Procedures   . Diet Regular; Yes - patient to order food; Sodium 3 GM; Heart Healthy     PO Intake is adequate; last 4 meals 75-100% of meals    Other Allergies: Heparin and Sulfa (sulfonamide antibiotics)     Client History:  Admission Diagnosis: Respiratory failure (CMS/HCC) [J96.90]    Diagnosis:     SNOMED CT(R)   1. Heparin induced thrombocytopenia (CMS/HCC)  HEPARIN-INDUCED THROMBOCYTOPENIA   2. Aortic stenosis, severe  AORTIC VALVE STENOSIS   3. Acute systolic heart failure (CMS/HCC)  ACUTE SYSTOLIC HEART FAILURE   4. Demand ischemia of myocardium (CMS/HCC)  ACUTE ISCHEMIC HEART DISEASE   5. Non-ischemic cardiomyopathy (CMS/HCC)  CARDIOMYOPATHY   6. Iron  deficiency anemia due to chronic blood loss  IRON DEFICIENCY ANEMIA   7. NSTEMI (non-ST elevated myocardial infarction) (CMS/HCC)  ACUTE NON-ST SEGMENT ELEVATION MYOCARDIAL INFARCTION   8. Obesity due to excess calories, unspecified obesity severity  SIMPLE OBESITY   9. S/P AVR (aortic valve replacement)  HISTORY OF AORTIC VALVE REPLACEMENT   10. Elevated brain natriuretic peptide (BNP) level  HORMONE LEVEL - FINDING   11. Tachycardia  TACHYCARDIA   12. Pleural effusion  PLEURAL EFFUSION   13. Acute respiratory failure with hypoxia (CMS/HCC)  ACUTE RESPIRATORY FAILURE   14. Thrombocytosis (CMS/HCC)  THROMBOCYTOSIS   15. Weight loss  WEIGHT DECREASED   16. Family history of cancer  FAMILY HISTORY OF CANCER   17. Cardiomegaly  CARDIOMEGALY   18. Mediastinal lymphadenopathy  MEDIASTINAL LYMPHADENOPATHY   19. Hilar lymphadenopathy- bilataeral   HILAR LYMPHADENOPATHY   20. Dyspnea and respiratory abnormality  DYSPNEA   21. Abnormal EKG  ELECTROCARDIOGRAM ABNORMAL   22. Opacity of lung on imaging study  ABNORMAL FINDINGS ON DIAGNOSTIC IMAGING OF LUNG   23. Liver lesion- small hypodense lesion around falciform lig  LESION OF LIVER   24. Respiratory failure, unspecified chronicity, unspecified whether with hypoxia or hypercapnia (CMS/HCC)  RESPIRATORY FAILURE  Usual meds include: Reviewed   Current meds include: Pepcid, lasix, insulin, zofran, potassium chloride    Biochemical Data, Medical Tests and Procedures:  Lab Results   Component Value Date    NA 141 02/17/2016    NA 141 02/17/2016    K 3.4 (L) 02/17/2016    K 3.4 (L) 02/17/2016    CL 111 02/17/2016    CL 111 02/17/2016    CO2 22.6 02/17/2016    CO2 22.6 02/17/2016    GLU 111 (H) 02/17/2016    GLU 111 (H) 02/17/2016    BUN 10 02/17/2016    BUN 10 02/17/2016    CREATININES 0.79 02/17/2016    CREATININES 0.79 02/17/2016    LABCREA 77 01/28/2016    MG 1.8 02/10/2016    PHOS 1.8 (L) 02/17/2016   K and Phos is BNL - replace     Nutrition Diagnosis (Problem, Etiology,  Signs and Symptoms)  Inadequate protein-energy intake related to poor appetite as evidenced by PO intake of 0-50% of meals and pt report of poor appetite.   (Progress:  Progressing.)    Nutrition Intervention:   - Continue current diet per SLP; will follow for PO adequacy.  - Will continue to strawberry smoothie with Beneprotein BID.  - Will continue 12 oz chocolate milkshake with benecal and beneprotein once per day. Each milkshake will provide 725 kcals and 27g protein.  - Will send fruit with every tray.     Monitoring and Evaluation:   Will continue to follow and monitor PO intake and labs per nutrition protocol.  PO goal: >70% of meals/supplements. Goal Achieved    Follow Up: 4-8 days    Donnal Debar, MS, RD, LD, CDE  02/17/2016  7:54 AM  [facility]

## 2016-02-17 NOTE — H&P (Signed)
Post-Admission Physician Evaluation:  This patient is seen and evaluated following arrival to the inpatient rehabilitation center.  There are no significant changes from the pre-admission screen.  Has high level of medical complexity requiring close supervision and possible interventions, also marked weakness and functional impairments though able to participate in intensive rehab at this time.       [facility]      Name: Brandy [name]  DOB: [birth year] 60 y.o.  MRN: [id number]  CSN: [id number]    History:     History of Present Illness  The patient is a 60 year-old woman who was admitted with acute cardio-pulmonary failure, was found to have severe aortic stenosis for which she required aortic valve replacement.  Required intra-aortic baloon pump initially.  She had a intra-operative ejection fraction of only 15% (but now estimated at 30-35%).  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  She did have hematochezia on 8/26.    ?  Per surgical note: Patient's a 60 year old female who presented the [facility] falls in respiratory distress unable to breathe, troponin was elevated, she underwent oxygen therapy and was given Lasix. ?Cardiac workup showed normal coronary arteries with depressed LV function on echo and severe aortic stenosis valve area less than 0.4 velocity 4.4 m/s with an EF 30%. ?She was transfused 2 units of PRBCs A [facility] hospital with a hemoglobin 8.8.   Patient hasn't been to the doctor in years she hasn't taken any medications nor does she take good care of herself. [place] she is unable to consistently take meds.  Functionally she is now able to walk about 30 feet with rest brakes, contact-guard assist. Worked well with OT but was quite impaired with transfers.  Nevertheless, OT recs [name].   RT notes this am reveals improving pulmonary function and clearing.   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  Today she is supratherapeutic on coumadin.   Had renal failure, was on CRRT, now improved.    She does have ischemic digits, probably from levofed and circulatory collapse.    Has received amiodarone for atrial fib, and also had successful cardioversion.   Also CRRT.  Now her renal function is normalized.  Has persistently elevated WBC count.  Her anemia is better status-post blood transfusion.   ?  Had a FEES swallow evaluation, she did have some episodes of aspiration with thin liquids and some persistent residual in vallecula.  She is being cautiously advanced to dysphagia-pureed diet with think liquids, but careful monitoring.    ?  WOCN notes reviewed as well, she has ischemic right index finger and right 3 rd toe as well as several pads on her toes.     Per cardiothorac surgery discharge summary:    PREOPERATIVE DIAGNOSES: ?  1. Severe symptomatic aortic stenosis  ??????????????????????Ejection fraction 15%  ??????????????????????Normal coronaries  ??????????????????????Predominant symptom dyspnea???????????????????  2. Severe Pulmonary Hypertension  3. Mild to Moderate Mitral regurgitation  4. Morbid obesity  ??????????????????????Weight loss 50 pounds in the last one year  5. respiratory insufficiency  6.?Non-ST elevation MI  ??????????????????????The demand ischemia  7. Iron deficiency anemia  ??????????????????????CT chest abdomen and pelvis reviewed no abdominal masses  ??????????????????????Likely AVM secondary to aortic stenosis not a good candidate for sedation and EGD or colonoscopy  well aortic stenosis is so critical  Current Function:  Past Medical History:  Past Medical History:   Diagnosis Date   . History of blood transfusion      Past Surgical History:  Past Surgical History:   Procedure Laterality Date   . PROCEDURE  N/A 01/27/2016    Procedure: AORTIC VALVE REPLACEMENT; INTRA AORTIC BALLOON INSERTION;  Surgeon: Otho Bellows, MD;  Location: [facility] OR;  Service: Cardiology;  Laterality: N/A;   . TONSILLECTOMY     . TUBAL LIGATION     . TUBAL LIGATION  1980     Current Medications:  Scheduled Meds:  . amiodarone  400 mg Oral 2 times per day   . aspirin  81 mg Oral Daily   . atorvastatin  20 mg Oral Nightly   . carvedilol  6.25 mg Oral BID with meals   . famotidine  20 mg Oral 2 times per day   . [START ON 02/18/2016] furosemide  40 mg Oral Daily   . potassium chloride SA  20 mEq Oral Daily   . warfarin  4 mg Oral Daily     Continuous Infusions:   PRN Meds:.acetaminophen, albuterol, bisacodyl **OR** bisacodyl, oxyCODONE, polyethylene glycol, simethicone     Allergies:  Heparin and Sulfa (sulfonamide antibiotics)    Family History:  Family History   Problem Relation Age of Onset   . Cancer Mother    . Heart disease Father    . Heart disease Sister    . Cancer Brother    . Cancer Maternal Aunt    . Heart disease Maternal Aunt    . Cancer Maternal Uncle    . Heart disease Maternal Uncle      Social History:      Social History   Substance Use Topics   . Smoking status: Never Smoker   . Smokeless tobacco: Never Used   . Alcohol use No     Review of Systems:  Pertinent items are noted in HPI.    Physical Exam:     Vital Signs:  Vital Sign Ranges for Last 24 Hours:  BP  Min: 107/64  Max: 146/80  Temp  Min: 36.2 ?C (97.2 ?F)  Max: 36.7 ?C (98.1 ?F)  Pulse  Min: 79  Max: 94  Resp  Min: 17  Max: 18  SpO2  Min: 95 %  Max: 97 %        Exam:  An alert 60 year-old woman, no distress  She is sitting up in chair, speech clear, mentation intact  Very slight conversational dyspnea is noted  Face symmetric, cranial nerves are intact  Sternotomy incision noted  Her heart is regular rate and rhythm  Lungs are clear  There is a dressing over epigastric area from a drain site - this is removed and inspected, no infection  Upper limbs:  The ecchymosis of  several finger tips noted  She has mild, general weakness of both upper limbs, no focal deficits.   Lower limbs:  There is marked edema of both legs, from feet to distal thighs.  The areas of darkness and ecchymosis noted on both feet, mainly the toes.  The plantar (weightbearing) surfaces are without discoloration.  She has grossly intact sensory function.  Motor function shows mild, general weakness, no focal deficit.   ?    Data:     Results for orders placed or performed during the hospital encounter of 01/23/16 (from the past 24 hour(s))   APTT -Next  Routine   ? Collection Time: 02/14/16  4:29 PM   Result Value Ref Range   ? APTT 61 (H) 23 - 36 Seconds   APTT -Daily   ? Collection Time: 02/15/16  4:45 AM   Result Value Ref Range   ? APTT 63 (H) 23 - 36 Seconds   CBC with Auto Differential -Daily   ? Collection Time: 02/15/16  4:45 AM   Result Value Ref Range   ? WBC 14.0 (H) 4.8 - 10.8 10*3/?L   ? RBC 3.59 (L) 4.20 - 5.40 10*6/?L   ? Hemoglobin 8.9 (L) 12.0 - 16.0 g/dL   ? HCT 28.2 (L) 37.0 - 48.0 %   ? MCV 78.5 (L) 81.0 - 99.0 fL   ? MCH 24.8 (L) 27.0 - 34.0 pg   ? MCHC 31.5 (L) 32.0 - 36.0 g/dL   ? RDW 28.1 (H) 11.5 - 14.5 %   ? Platelet Count 345 150 - 400 10*3/?L   ? MPV 7.4 7.4 - 10.4 fL   ? Neutrophils % 83 (H) 35 - 70 %   ? Bands % 1 0 - 10 %   ? Lymphocytes % 12 (L) 25 - 45 %   ? Monocytes % 2 0 - 12 %   ? Eosinophils % 0 0 - 7 %   ? Basophils % 0 0 - 2 %   ? Metamyelocytes % 2 (H) 0 %   ? Neutrophils, Absolute 11.6 (H) 1.6 - 7.3 10*3/?L   ? Bands, Absolute 0.1 0.0 - 1.2 10*3/?L   ? Lymphocytes, Absolute 1.7 1.1 - 4.3 10*3/?L   ? Monocytes, Absolute 0.3 0.0 - 1.2 10*3/?L   ? Eosinophils, Absolute 0.0 0.0 - 0.7 10*3/?L   ? Basophils, Absolute 0.0 0.0 - 0.2 10*3/?L   ? Metamyelocytes, Absolute 0.3 (H) 0.0 10*3/?L   ? Differential Type Manual Differential ?   ? Platelet Estimate Normal Normal   ? Anisocytosis 3+ (A) (none)   ? Hypochromasia 1+ (A) (none)   ? Macrocytes 1+ (A) (none)   ? Microcytes 1+ (A)  (none)   ? Poikilocytosis 1+ (A) (none)   Renal Function Panel -Daily   ? Collection Time: 02/15/16  4:45 AM   Result Value Ref Range   ? Albumin 2.6 (L) 3.5 - 5.0 g/dL   ? Calcium 8.6 8.6 - 10.3 mg/dL   ? Phosphorus 1.8 (L) 2.5 - 4.5 mg/dL   ? BUN 16 6 - 23 mg/dL   ? Creatinine 0.92 0.55 - 1.10 mg/dL   ? Sodium 141 135 - 143 mmol/L   ? Glucose 100 (H) 80 - 99 mg/dL   ? Potassium 3.6 3.5 - 5.1 mmol/L   ? Chloride 109 98 - 111 mmol/L   ? CO2 - Carbon Dioxide 23.0 21.0 - 31.0 mmol/L   ? Anion Gap 9.0 3.0 - 11.0 mmol/L   ? GFR Estimate >60 >=60 mL/min/1.58m*2   ? GFR Additional Info ? ?   Protime Panel -Now & Q AM   ? Collection Time: 02/15/16  4:45 AM   Result Value Ref Range   ? Protime 45.4 (H) 10.0 - 13.0 Seconds   ? INR 4.0 (H) 0.9 - 1.2   ?  ?  ??  Imaging  CXR from 9/5:  FINDINGS: ?Stable mild cardiomegaly. Intact sternotomy wires. Cardiac valve   prosthesis. No pneumothorax. Persistent vascular congestion probably   representing pulmonary venous hypertension. Probable subsegmental atelectasis   of lung bases. No  definite pleural effusion. Unchanged LEFT PICC  ?  ?  ?  ?    Assessment and Plan  This is a highly-complex 60 year-old woman who has had bioprosthetic aortic valve replacement.  Her hospitalization characterized by multiple severe issues including HIT, atrial fib, ARF, CHF, Non-STEMI, severe blood-loss anemia (thought to be from GI source), dysphagia, persistent pulmonary insufficiency, and general weakness and deconditioning.  Continues with marked edema in both lower limbs.  Has multiple areas of ischemic changes in fingers and toes, overall of moderate severity, no evidence of infection at this time.    . Impaired gait and mobility: begin intensive PT, anticipate improving gait and balance.    . Impaired activities of daily living:  Begin intensive OT, anticipate mod-I with ADL   . Peripheral edema:  Rather marked in lower limbs.  Continue gentle diuresis, caution to not over-diurese or allow  hypokalemia, dehydration, etc.    . Ischemia of digits of hands and feet:  Continues to demarcated, WOCN following, will monitor.  This is due to pressors and shock   . Blood loss anemia: moderate to marked, stable, will continue to monitor.    . Hypokalemia:  Replace, in better range now, monitor.    . Heparin induced thrombocytopenia:  Continue coumadin, currently in therapeutic range   . S/P AVR (aortic valve replacement):  As above. Maintain and reinforce sternotomy precautions   . Non-ischemic cardiomyopathy:  Continue with coreg.  She's also on amiodarone, will wean as directed by CT surgery service.    . Demand ischemia of myocardium:  Resolved at this time, continue coreg and aspirin.            Hansel Feinstein  02/17/2016  1:40 PM

## 2016-02-17 NOTE — Progress Notes (Signed)
Patient to room @ 1210 in a w/c. Alert, coop, pleasant with staff. One person assist with transfer from w/c to bed. Husband in room at this time. Restful in bed. Kathy Breach, RN.

## 2016-02-17 NOTE — Progress Notes (Signed)
CARDIOTHORACIC SURGERY CRITICAL CARE NOTE     Name: Brandy [name]  DOB: [birth year] 60 y.o.  MRN: [id number]  CSN: [id number]    Brandy [name] is a 60 y.o. female patient. Patient was admitted with Respiratory failure (CMS/HCC)  Who had Procedure(s):  AORTIC VALVE REPLACEMENT; INTRA AORTIC BALLOON INSERTION.  21 Days Post-Op    HPI: The patient is a 60 y.o. female with the below medical co-morbid conditions.    ?  1. Severe symptomatic aortic stenosis                        Ejection fraction 30%     Found on TEE intra-op to be 15%                        Normal coronaries                        Predominant symptom dyspnea                     2. Severe Pulmonary Hypertension  3. Mild to Moderate Mitral regurgitation  4. Morbid obesity                        Weight loss 50 pounds in the last one year  5. respiratory insufficiency  6. Non-ST elevation MI                        The demand ischemia  7. Iron deficiency anemia                        CT chest abdomen and pelvis reviewed no abdominal masses                        Likely AVM secondary to aortic stenosis not a good candidate for sedation and EGD or colonoscopy well aortic stenosis is so critical  ?  Patient's a 60 year old female who presented the [facility] falls in respiratory distress unable to breathe.  There are her troponin was elevated she underwent oxygen therapy and was given Lasix.  Cardiac workup showed normal coronary arteries with depressed LV function on echo and severe aortic stenosis valve area less than 0.4 velocity 4.4 m/s with an EF 30%.  She was transfused 2 units of PRBCs A [facility] hospital with a hemoglobin 8.8.  His immobility volume overload from heart failure  ?  Patient hasn't been to the doctor in years she hasn't taken any medications nor does she take good care of herself.  [place] of [place] she is unable to consistently take meds.  She likes to use and asked to [employer] wood to heat her house.    ?  After a few  days of Lasix patient states that she was breathing much better she is 60% better per her suggestion.  I had a long talk with the family about the disease course why she was getting short of breath and discussion of heart failure.  They understand that she is given a need aortic valve replacement she wishes to have tissue valve I do think she is a better surgical candidate and have her given her age and risk.  ?  She has very poor dentition    POD #1  Patient has  been stable on balloon 1:2   Methylene blue last night SVR much better   Coronary perfusion good   Today I am managing acute heart failure with management of the IABP.    POD #2  removal of the intra-aortic balloon pump renal ultrasound completed showing minimal flow to the kidneys  patient still making urine  POD #3 CRRT started  POD #4 CRRT running acid base more stable pressures much better off NEO and LEVOPHED improved making urine  POD#5 CRRT continues   Patient plt 25   Will check for DVT   Postoperative day #6 CRRT continues platelets now up to 32 treating for heparin-induced from cytopenia  Postoperative day #7 Trialysis Cath replaced secondary to malfunction.  On agatroban still hemodynamics improving oxygen requirements lower.  Postoperative day #8 making transition to hemodialysis   Postoperative day #9 +12 liters still  Will need HD likely     POD #10:  11.8 L positive. Planning hemodialysis today.  remains in A. Fib on the amiodarone protocol.  POD#11: 8 liters positive.  No other changes  POD#12: still in AFIB rates 110-130   Multiple amiodarone loads without success...digoxin not good option secondary to renal failure  POD#13 doing well  Cardioversion by Dr. Truddie Hidden  POD#14 no major issues   In NSR  POD#15 NSR doing well needs rehab   POD#16 NSR looked good with PT yesterday getting stronger ischemic toes feel much better   POD#17 NSR continues on argatroban INR 2.8  POD#18 NSR  INR 3.2  POD#19 NSR  INR 4.0   POD #20 sinus rhythm, INR 4.2;  hypokalemic 3.3; 24 hour urine 2475  POD #21:  NSR, INR 2.8, feeling well and making good but slow progress    Subjective: making progress    Assessment & Plan:     Active Hospital Problems    Diagnosis SNOMED CT(R) Date Noted   . Acute renal failure with tubular necrosis (CMS/HCC) ACUTE RENAL FAILURE SYNDROME 02/13/2016   . Heparin induced thrombocytopenia (CMS/HCC) HEPARIN-INDUCED THROMBOCYTOPENIA 02/02/2016   . S/P AVR (aortic valve replacement) HISTORY OF AORTIC VALVE REPLACEMENT 01/27/2016   . Respiratory failure (CMS/HCC) RESPIRATORY FAILURE 01/23/2016   . Aortic stenosis, severe AORTIC VALVE STENOSIS 01/22/2016   . NSTEMI (non-ST elevated myocardial infarction) (CMS/HCC) ACUTE NON-ST SEGMENT ELEVATION MYOCARDIAL INFARCTION 01/21/2016   . Demand ischemia of myocardium (CMS/HCC) ACUTE ISCHEMIC HEART DISEASE 01/21/2016   . Iron deficiency anemia IRON DEFICIENCY ANEMIA 01/21/2016   . Obesity OBESITY 01/21/2016       Scheduled Medications:   . amiodarone  400 mg Oral 2 times per day   . ascorbic acid (vitamin C)  1,000 mg Oral Daily   . atorvastatin  20 mg Oral Nightly   . carvedilol  6.25 mg Oral BID with meals   . docusate calcium  240 mg Oral 2 times per day   . famotidine  20 mg Oral 2 times per day   . folic acid  1 mg Oral Daily   . furosemide  40 mg Oral Daily   . ondansetron       . potassium chloride SA  20 mEq Oral Once     Infusions:   . argatroban Stopped (02/16/16 1610)   . warfarin on hold       Recent Labs:  Lab Results   Component Value Date    NA 141 02/17/2016    NA 141 02/17/2016    K 3.4 (L) 02/17/2016  K 3.4 (L) 02/17/2016    K 3.69 01/23/2016    CL 111 02/17/2016    CL 111 02/17/2016    CL 104.1 01/23/2016    CO2 22.6 02/17/2016    CO2 22.6 02/17/2016    CO2 20 (L) 01/23/2016    GLU 111 (H) 02/17/2016    GLU 111 (H) 02/17/2016    GLU 85 01/23/2016    BUN 10 02/17/2016    BUN 10 02/17/2016    CREATININES 0.79 02/17/2016    CREATININES 0.79 02/17/2016    CALCIUM 8.5 (L) 02/17/2016    CALCIUM  8.5 (L) 02/17/2016    AST 25 02/17/2016    ALT 20 02/17/2016    ALKPHOS 88 02/17/2016    BILITOT 0.90 01/21/2016    ALBUMIN 2.3 (L) 02/17/2016    ALBUMIN 2.3 (L) 02/17/2016    GLOB 2.9 02/17/2016    AGRATIO 0.8 (L) 02/17/2016    ANIONGAP 7.4 02/17/2016    ANIONGAP 7.4 02/17/2016    ANIONGAP 21.0 (H) 01/23/2016    LABGLOM >60 02/17/2016    LABGLOM >60 02/17/2016    LABGLOM >60.0 01/23/2016    WHITEBLOODCE 7.5 02/17/2016    LABPLAT 240 02/17/2016    HGB 8.7 (L) 02/17/2016    PROTIME 31.5 (H) 02/17/2016    INR 2.8 (H) 02/17/2016    APTT 38 (H) 02/17/2016          Vital Signs:        Temp: 36.7 ?C (98 ?F) BP: 138/70 Pulse: 79 Resp: 17 SpO2: 97 % on O2 Flow Rate (L/min): 2 L/min None (Room air)   Intake/Output:     CT OUTPUT: removed     URINE OUTPUT: 2100>2475  +5 liters    Exam:  Neuro: movine all extremities equal strength  Cardiac: NSR no murmurs  Lungs;clear  ABDOMEN:abdomen is soft without significant tenderness, masses, organomegaly or guarding  EXTREMITIES 2-3+ edema in the lower extremities toes appear much better digital ischemia much improved    PLAN:   Neurologic: No issues      Cardiac: Pre-op EF:   EF 15% prior to surgery   Post op 30% with good valve function 23mm valve     1. Inotropes off   2. Pressors: off   3. Rhythm: NSR   AMIODARONE 400 mg BID   4. Aspirin: holding    5. Beta-Blocker: carvedilol 6.25 BID   6. Statin: atorvastatin 20mg    7. ACE/ARB: hold        Respiratory:    Smoker none   Pulmonary toilet    Xray: wet but improved  ABX stopped     Gastrointestinal:   Nutritional status not great       GI CONSULT NEEDED as outpatient     Genitourinary: creat: pre op 0.9   1.25>2.16>2.76>3.15>2.4>1.78>1.49>1.2>0.99>0.92  OFF CRRT 8/25 in AM    Infectious Disease:  WBC: 18>15>17>15>15>13.8>14>8.9    Hematology: HGB 7.1>7.2>6.7>6.6>8.9>   PLTS 404 >461>425>398>345>302       hitt  ++++    started agatroban   Thrombocytopenia resolved   coumadin INR 3.2>4.0>4.2  discontuing agatroban today  02/16/16    Endocrine: Glucose management   Electrolytes: replacing Mg/K/Ca  Prophylaxis Argatroban PPI/H2 Blocker      SUMMARY    Pending bed and insurance approval for admittance to [facility]  D/c pacing wires (cut d/t therapeutic INR)  Severe anemia secondary to blood loss from surgery and chronic disease  Continue Lasix once per day +3 liters  kidney function improving (0.79)  Remains 3 L +; 3+ LE edema; sat too long in chair yesterday  hypokalemic, 3.4, replace per protocol      Eligio Angert, PA

## 2016-02-17 NOTE — Progress Notes (Signed)
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 admitted on 02/17/2016 with a NSTEMI s/p R&L heart catheterization. PICC line placed 8/25. CRRT from 8/20-8/25. 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     Allergies: Heparin and Sulfa (sulfonamide antibiotics)  PMH:  has a past medical history of History of blood transfusion.   Active problems:   ? Heparin induced thrombocytopenia  ? S/P Aortic valve replacement     OBJECTIVE:     Height:   Admit weight: 102.1 kg (225 lb 1.4 oz) Last charted weight: 102.1 kg (225 lb 1.4 oz) IBW:   kg Body mass index is 36.33 kg/(m^2).    Recent Labs      02/15/16   0445  02/16/16   0517  02/16/16   1530  02/17/16   0433  02/17/16   1136   NA  141  142   --   141  141   --    K  3.6  3.3*  4.0  3.4*  3.4*  4.0   CL  109  112*   --   111  111   --    CO2  23.0  24.5   --   22.6  22.6   --    GLU  100*  86   --   111*  111*   --    BUN  16  13   --   10  10   --    CREATININES  0.92  0.92   --   0.79  0.79   --    CALCIUM  8.6  8.6   --   8.5*  8.5*   --    AST   --    --    --   25   --    ALT   --    --    --   20   --    ALBUMIN  2.6*  2.5*   --   2.3*  2.3*   --      Recent Labs      02/15/16   0445  02/16/16   0517  02/17/16   0433   WHITEBLOODCE  14.0*  8.9  7.5   NEUTROABS  11.6*  6.8  6.1   BANDSABS  0.1   --    --    HGB  8.9*  8.6*  8.7*   HCT  28.2*  27.5*  26.9*   LABPLAT  345  302  240     Warfarin Dosing Record:  Date: 8/31 9/1 9/2 9/3 9/4 9/5 9/6 9/7   INR:3.6 2.9 3 4.1 2.8 3.2 4.0 4.2 @ 0517  3.6 @ 1055  3.8 @ 1530 2.8   Warfarin: 3 mg 3 mg 2.5 3 mg 5 mg 5 mg Hold      ASSESSMENT:     Estimated CrCl: 81 mL/min (CG AdjBW) per [employer]      Argatroban infusion:  Discontinued on 02/16/16  ? 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 Thrombosis in  right lower extremity on 8/22  ? Argatroban at 0.9 mcg/kg/min - discontinued on 9/6 @ 0640  ? APTT = 63 (goal 50 - 90);  INR 4.0 (goal 4 - 6 w/ concurrent warfarin + argatroban)  ? Warfarin and agratrogan overlapped for 7 days    Recent vitamin K administration: None noted in Epic   Warfarin Indication:  HIT w/ thrombosis in RLE on 8/22; Atrial fibrillation   Goal INR: 2-3 (4-6 while on argatroban)   Today's INR: Therapeutic   Home warfarin dose: N/A   Additional anticoagulant/antiplatelet:  Argatroban 0.9 mcg/kg/min   Comments about potential drug, disease or dietary interactions? Amiodarone, argatroban, acetaminophen (0 mg/24 hrs)   Warfarin education status: Completed on 02/16/16       PLAN:     Warfarin:  ? INR is now in the goal range of 2-3  ? Start warfarin 3 mg po daily   ? Daily INR/PT ordered  ? Pharmacy to monitor and adjust dosing as needed    Signed:  Darrick Penna, [facility]

## 2016-02-17 NOTE — Other (Signed)
[name] PT EVALUATION     [name] / 2303/2303-01 DOB:  [birth year] / 60 y.o. Date:  02/17/2016     Precautions  Specific mobility precautions: Sternal precautions  Sternal precautions: Standard  Other: Very sore (R) 3rd toe; Ejection fraction estimated to be 30-35%     ASSESSMENT     Summary:  Patient limited by moderate+ fatigue this session,reports having had a busy morning and felt worn out from being transferred from medical unit to the [facility]. Currently requiring min/mod assist for logroll technique supine to/from sitting and for sit to/from stand from bed and wheelchair. Seems to have a good/fair understanding of sternal precautions, but will require ongoing education and reminders for safety. Gait distances limited to 40-65 ft today, primarily due to fatigue and hip tightness. She will certainly benefit from intensive rehabilitation with the ultimate goal of discharging home at a modified independent level for transfers and gait using a 4WW. Cleared for mobility in room with nursing staff using gait belt and 4WW. In wheelchair for all meals and pt encouraged to eat in dining room for socialization purposes.      Quality Indicator goal:  PT QI goal category / rating: Chair/bed-to-chair transfer  Chair/bed-to-chair: 6    Activity Tolerance:   Activity tolerance: Tolerates 30 min activity with multiple rests  Comments: quick to fatigue this session; required several rest breaks   Rehab Potential:   Rehab Potential: Good   Safety Awareness:    Precaution Awareness: Fully aware of precaution(s)  Deficit Awareness: Fully aware of deficits  Correction of Errors: Self-corrects with cues  Safety Judgment: Good awareness of safety   Problems:   Decreased strength;Decreased mobility;Decreased balance   DME Needs:    DME Needs: 0JW     PLAN     Treatment Plan   Ardyce will benefit from Ther Ex;Bed Mobility training;Transfer training;Gait training;Stair training;ADL training;Therapeutic Activity  training;Pt/caregiver training;Muscle Re-Education;Modalities as appropriate;Community re-entry;Wheelchair mobility training;DME needs assessment.      Intensity/Frequency:  Duration:   Intensity/frequency: 90 minutes daily, 5x/week  14 days      Continue intensive level of Physical therapy to address progressive mobility training, caregiver training and DME needs.          Treatment Diagnosis Surgery (if applicable)   Primary Dx: Acute cardio-pulmonary failure; severe aortic stenosis which required aortic valve replacement  Treatment Dx: Impaired mobility  Procedure/Date: 01/27/2016: Aortic valve replacement       SUBJECTIVE     Joined OT for co-evaluation lasting 55 minutes of 90 minute total session. Co-evaluation was deemed necessary as pt was very fatigued this afternoon relating to transfer from Acute floor to [facility], and it was felt that she would otherwise be unable to perform 3 total hours of activities this afternoon. Pt denies pain and is very happy to be on the inpatient rehab unit. She wants to work hard to gain strength and stamina in order to return to her home.      Pain Level  Current status: Pain does not limit pt's ability to participate in therapy  Pain at start of session: 0 - No Pain  Pain at end of session: 0 - No Pain   Hx of current problem  Long hospital course including aortic valve replacement (via median sternotomy) and required CRRT due to severe illness. Please refer to medical record for specifics.    Social history   Lives with: Spouse ([place], [place])       Prior  LOF  Prior ambulatory status: Walked without assistive device  Home/Community: Pt. was independent in home & community   Home environment  Living situation: Mobile/Manufactured home  Home style: One level  Number of stairs to enter home: 5-6 (described as short height)  Railings into home: Double railing  Number of stairs inside home: 0   Home equipment  Mobility Equipment: None (pt will ask husband to look; unsure)          OBJECTIVE        H & P Reviewed.   Mental status:   General Demeanor: Pleasant;Cooperative  LOC: Alert  Orientation: Oriented x 4  --> Follows one-step commands: Consistently  Attention span: Appears intact  Memory: Appears intact in social/therapy situations   Medical appliances:   Medical Appliances  Medical Appliances: None   Strength:  Strength  Lower extremities: Grossly 4/5 bilaterally       TREATMENT    Therapeutic Activities:  35 minutes spent on intensive education regarding activity guidelines, safety considerations,  importance of utilizing rest breaks, gait instruction, transfer techniques, sternal precautions, logroll technique, etc. (Billed for 2 units of therapeutic activities.)     Bed Mobility/Transfers  Bed mobility       Rolling / logroll  Roll L: Min Assist;Mod Assist;Verbal cues      Transition to sitting up Sidelying to Sit: Min Assist;Mod Assist;Verbal cues   To resting position      Transition to sitting down CGA;Min Assist;Verbal cues     Transition to supine Min Assist;Mod Assist;Verbal cues (via logroll technique)     Scooting     Transfers      Transition to standing From bed: Min Assist;Mod Assist;Verbal cues  From wheelchair: Min Assist;Mod Assist;Verbal cues     Method Stand pivot: Min Assist;Mod Assist (with [id number])      Gait/Stairs:  Mobility - Gait (Level)  Gait (distance): 40 feet (3 trials of 40-65 ft each)  Gait (assist level): CGA;Min Assist;Verbal cues  Assistive device: Four wheeled walker  Endurance: fair/poor; limited currently  Pace: slow  Pattern: Downward gaze;Wide BOS;Decreased step length L;Decreased step length R;Excessive trunk flexion  Surface: Level tile  Mobility - Gait (Stairs/curbs)  Number of Stairs: 0 (too fatigued to attempt stairs this session)    Wheelchair mobility:  Deferred due to pt's level of fatigue this afternoon   Balance:   Balance - Basic  Assist level: SBA  Position: Feet - supported;Surface - Level  Time: 10+ min   Training/Education:  Primary  Learner: Patient  Training provided: PT plan of care; Introduction to Inpatient Rehab; Activity guidelines; Safety considerations; Review of sternal precautions and logroll technique  Response to training: Receptive   Safety/Room set-up:     Call button accessible?: Yes  Phone accessible?: Yes  Oxygen reconnected?: No, on room air  Position on arrival: In bed  Position on departure (bed): Bed alarm on;Bed in lowest position;Standard bed;2 bedrails up     GOALS     Patient/Caregiver goals reviewed and integrated with rehab treatment plan:     Multidisciplinary Problems (Active)        Problem: Caregiver Training    Goal Priority Disciplines Outcome   Caregivers to demonstrate safe patient assist, as needed     PT           Problem: DME    Goal Priority Disciplines Outcome   Durable Medical Equipment needs to be met     PT  Problem: Mobility: Ambulation    Goal Priority Disciplines Outcome   Gait (with precautions)     PT    Description:  Pt. to ambulate 150 feet at modified independent level, following sternal precautions, using 4WW.    Stairs (with precautions)     PT    Description:  Pt. to ascend/descend 6 stairs with SBA, using (B) rails, following sternal precautions.            Problem: Mobility: Transfers    Goal Priority Disciplines Outcome   Supine to/from sitting (with precautions)     PT    Description:  Pt. to transfer to/from sitting at modified independent level, following sternal precautions.    Bed to/from chair (with precautions)     PT    Description:  Pt. to transfer bed to/from chair at modified independent level, following sternal precautions.           Problem: Patient/Family Goal    Goal Priority Disciplines Outcome   Home     PT    Description:  Pt. wants to return to previous living situation.           Problem: Precautions    Goal Priority Disciplines Outcome   Application of Precautions     PT    Description:  Pt. to verbally recall and follow all pertinent precautions, including  sternal precautions.                  First session    Start/Stop times: 1405 - 1535    Total time: 90 minutes       Therapist:  Jannifer Hick, PT

## 2016-02-17 NOTE — Other (Signed)
[name] OT EVALUATION    [name] / 2303/2303-01 DOB:  Jun 26, 1955 / 60 y.o. Date:  02/17/2016     Precautions  Specific mobility precautions: Sternal precautions  Sternal Precautions: Standard     ASSESSMENT     Summary:  Pt is pleasant lady who was admitted with respiratory distress and NSTEMI s/p AVR. Pt reports being independent PLOF with ADL's and IADL's and active. Pt lives in single level mobile home with spouse and reports 5 steps with railings to entrance. Pt stated she did not use any AE PTA, though stated she does have some equipment from previous family member such as FWW and shower grab bars (pt reports these were installed incorrectly and may not be the safest option to use). Pt is unsure if she has a shower chair available. Pt reported no grab bars by toilet and regular height toilet seat.     Pt demonstrated 3+/5 strength in B UE's within sternal precautions and AROM WFL. Pt demonstrated fair+ B UE coordination with nose to finger and thumb opposition, though decreased FM manipulation with smaller items such as toothpaste cap. Pt completed therapeutic activity of finger-palm and palm-finger translation as well as tip pinch.     Pt is currently requiring min A with v/c's and HOB elevated for supine to sitting EOB transfers. Pt completed multiple sit <> stand transfers with min-mod A and v/c's for safety of locking 4WW brakes and to adhere to sternal precautions. Sit <> stand tranfers were from various surfaces including bed, toilet, w/c and 4WW. Pt also required frequent sitting resting breaks during functional mobility (please see PT notes for further details).     Pt is currently requiring max A for LB dressing and toileting tasks, min A UB dressing and CGA for grooming. Pt is good candidate for [name] as pt was independent PLOF without use of AE. Pt is currently demonstrating decreased strength, endurance, FM manipulaiton and decreased functional performance with ADL's. Pt would also  benefit from AE/DME upon d/c and CG education with spouse.      Quality Indicator goal:    Activity Tolerance  Activity tolerance: Tolerates 30 min activity with multiple rests  Unable to Tolerate Activity: Excessive general fatigue  Comments: Pt requires resting breaks throughout therapy session.   Rehab Potential  Good   Safety Awareness   Precaution Awareness: Decreased awareness of precaution(s) (Requires verbal cues with transfers to adhere to sternal pre)  Deficit Awareness: Fully aware of deficits  Correction of Errors: Self-corrects with cues  Safety Judgment: Good awareness of safety   Problems  Requires assistance for self-care;Decreased functional [place] of bilateral UEs;Decrease functional mobility;Decreased activity tolerance   DME Needs   Mobility Equipment needs: To be determined later   Recommendations        PLAN     Treatment Plan     Crystallynn will benefit from Treatment Plan: Continue per established POC.  Pt. demonstrates limitations which require skilled OT intervention.;self-care instruction;instruction/review of precautions;safety training;ADL training;strengthening;therapeutic activities;therapeutic exercises;functional mobility training;energy conservation training;patient/caregiver training;DME needs assessment.     Frequency Duration:   Intensity/Frequency: 90 minutes daily, 5x/week Duration : 14 days       [name] will remain on the Occupational Therapy schedule until goals are met, or there has been a change in the Plan of Care.          Treatment Diagnosis Surgery (if applicable)    Primary Dx: Respriatory failure, s/p AVR  Pertinent Past Medical Hx:   Please see medical chart.      SUBJECTIVE   Pt agreeable to OT treatment. Co-tx with PT for 55 minutes justification; pt excessively fatigued this afternoon and would otherwise be unable to complete three hours of therapy evaluations. Pt appreciative of therapy, though required affirmation throughout therapy session.      Pain  Level  Current status: Pain does not limit pt's ability to participate in therapy  Pain at start of session: 0 - No Pain  Pain at end of session: 0 - No Pain  Pain location: Foot;Buttocks;Back;Finger (Comment which one) (Right foot. Right pointer finger)  Wong-Baker FACES: 2 - Hurts little bit   Hx of onset  List general symptoms: Weakness   Hand dominance  Hand Dominance: Ambidextrous (Writes with right hand)   Social history  Lives with: Spouse  Support systems: Good support system from spouse and sister   Prior LOF  Prior ambulatory status: Walked without assistive device  Home/Community: Pt. was independent in home & community   Home environment  Living Situation: House  Home style: One level  Number of stairs to enter home: 5  Railings into home: Double railing  Tub/Shower: Yes  Stall Shower: No  ADA Height Toilet: No   Home ADL equipment  Mobility Equipment: Front wheeled walker (FWW)  ADL Equipment: Scientist, physiological bars;Shower chair;Toilet grab bars [employer] installed incorrectly. Possibly has chair)     OBJECTIVE   Reviewed chart, completed OT evaluation.    H & P Reviewed.   Mental status  General Demeanor: Pleasant;Cooperative  LOC: Alert  Orientation: Oriented x 4  Directions: Follows one-step commands  --> Follows one-step commands: Consistently  Attention span: Appears intact  Memory: Appears intact in social/therapy situations  Safety judgment: Decreased awareness of mobility safety and/or fall prevention safety (Requires cues to adhere to sternal precautions)   Hearing  Hearing Aides: No   Visual perception   Specifics: Corrective Lenses  Corrective Lenses: Wears glasses for distance, Wears glasses for reading (Wears glasses for distance when driving)   Strength   Strength  Upper extremities: Grossly 3 to 4-/5 bilaterally  Narrative: Within sternal precautions. Pt has generalized muscle weakness in B UE's.      ROM  Upper extremities: [facility] bilaterally   Sensation  Upper extremities: Grossly intact  bilaterally      TREATMENT  Transfers/Bed Mobility  Transition  Assist Level/Comments   Rolling      --> Sitting up Supine to Sit: Min Assist;Verbal cues   --> Standing From bed: Min Assist;Verbal cues;Mod Assist  From toilet: Min Assist;Verbal cues;Mod Assist  From wheelchair: Min Assist;Verbal cues;Mod Assist   Pivot Transfer     --> Sitting down     --> Supine     Scooting up in bed     ADLs/Transfers     ADL   Assist Level/Comments   Eating (Meal prep, self feeding)     Grooming CGA;Needs guidance for safety   Upper Body Dressing Min Assist   Lower Body Dressing Max Assist   Bathing     Toileting Max Assist   Toilet Transfer Min Assist;Verbal cues;Mod Assist   Tub or Shower Transfer     Where assessed: Standing at the sink;Edge of bed  Neuro  Sensation  Upper extremities: Grossly intact bilaterally  Coordination  UEs - General: Bilateral movements are reasonably coordinated  Finger to Nose L: Grossly intact  Finger to Nose R: Grossly intact  Thumb to Fingers L: Grossly intact  Thumb to Fingers R: Grossly intact  Narrative: Pt demonstrating grossly intact FM coordination skills, though continues to display difficulties with FM manipulation with small objects.     Training/Education  Trainee(s): Producer, television/film/video Learner: Patient  Primary Learner - Barriers to Learning?: Yes  Primary Learner - Specific barriers: Physical;Emotional  Training provided: OT evaluation, POC, functional transfers/mobility, FM manipulation with small objects  Response to training: Receptive   Safety/Room Set-up     Call button accessible?: No, as pt. is with staff.  Position on arrival: In bed  Position on departure (upright): Up at edge of bed (Pt left sitting upright at EOB working with PT)     GOALS     Patient/Caregiver goals reviewed and integrated with rehab treatment plan:     Multidisciplinary Problems (Active)        Problem: ADLs    Goal Priority Disciplines Outcome   ADL Activity Tolerance for Self Care     OT       Description:  Pt  to tolerate 10  minutes of self care activity in standing position    ADL Grooming     OT    Description:  Patient to be able to perform grooming with mod I assist.    ADL Toilet Transfers     OT    Description:  Patient to be able to perform toilet transfers with SBA.    ADL Tub Transfers     OT    Description:  Patient to be able to perform tub/shower transfers with SBA.    ADL Upper Body Bathing     OT    Description:  Patient to be able to perform upper body bathing with mod I assist.    ADL Lower Body Bathing     OT    Description:  Patient to be able to perform lower body bathing with min assist.    ADL Upper Body Dressing     OT    Description:  Patient to be able to perform upper body dressing with mod I assist.    ADL Lower Body Dressing     OT    Description:  Patient to be able to perform lower body dressing with min assist.    ADL Toileting     OT    Description:  Patient to be able to perform toilet hygiene with SBA.    Caregiver     OT    Description:  Caregiver will demonstrate the ability to assist with patient's care, as needed.           Problem: IP Post Cardiac Goal List    Goal Priority Disciplines Outcome   Precautions     OT    Description:  Patient to follow sternal  precautions, while performing ADLs.           Problem: Safety    Goal Priority Disciplines Outcome   Safety DME     OT    Description:  Pt/Caregiver to demonstrate awareness of DME needs for safety upon discharge.                  First session Second session (if applicable)    Start/Stop times: 1330 - 1500 Start/Stop times:   - Stop Time:     Total time: 90 minutes  Total time:   minutes     Therapist:  Werner Lean, OT

## 2016-02-17 NOTE — Plan of Care (Signed)
Individualized Overall Plan of Care:      Brandy [name],     Expected discharge disposition and length of stay: 14 days, then DC to home with husband      Rehabilitation Nursing:    Will continue to assess and assist to develop a workable bowel and bladder program     Will reinforce the self-care, safety, and mobility aspects of the patient's daily care, as set by the rehabilitation team.         Mobility/Physical Therapy:    We will continue to deliver physical therapy for 90 minutes daily, 5x/week for 14 days.  The focus will be on: Ther Ex, Bed Mobility training, Transfer training, Gait training, Stair training, ADL training, Therapeutic Activity training, Pt/caregiver training, Muscle Re-Education, Modalities as appropriate, Community re-entry, Wheelchair mobility training, DME needs assessment   Expected outcomes: Anticipate pt will achieve modified independent status with bed to/from chair transfers using 4WW     Activities of Daily Living/Occupational Therapy: Continue Frequency:    We will continue to deliver occupational therapy for 90 minutes daily, 5x/week for 14 days   The focus will be on: Continue per established POC.  Pt. demonstrates limitations which require skilled OT intervention., self-care instruction, instruction/review of precautions, safety training, ADL training, strengthening, therapeutic activities, therapeutic exercises, functional mobility training, energy conservation training, patient/caregiver training, DME needs assessment   Expected outcomes: Pt will complete LB dressing with min A, toileting tasks with SBA and UB dressing and grooming tasks with modified independent A.      Medical conditions and expected outcomes:  The following are the key medical issues being treated/monitored, along with expected outcomes:  This is a highly-complex 60 year-old woman who has had bioprosthetic aortic valve replacement.  Her hospitalization characterized by multiple severe issues including HIT,  atrial fib, ARF, CHF, Non-STEMI, severe blood-loss anemia (thought to be from GI source), dysphagia, persistent pulmonary insufficiency, and general weakness and deconditioning.  Continues with marked edema in both lower limbs.  Has multiple areas of ischemic changes in fingers and toes, overall of moderate severity, no evidence of infection at this time.    . Impaired gait and mobility: begin intensive PT, anticipate improving gait and balance.    . Impaired activities of daily living:  Begin intensive OT, anticipate mod-I with ADL   . Peripheral edema:  Rather marked in lower limbs.  Continue gentle diuresis, caution to not over-diurese or allow hypokalemia, dehydration, etc.    . Ischemia of digits of hands and feet:  Continues to demarcated, WOCN following, will monitor.  This is due to pressors and shock   . Blood loss anemia: moderate to marked, stable, will continue to monitor.    . Hypokalemia:  Replace, in better range now, monitor.    . Heparin induced thrombocytopenia:  Continue coumadin, currently in therapeutic range   . S/P AVR (aortic valve replacement):  As above. Maintain and reinforce sternotomy precautions   . Non-ischemic cardiomyopathy:  Continue with coreg.  She's also on amiodarone, will wean as directed by CT surgery service.    . Demand ischemia of myocardium:  Resolved at this time, continue coreg and aspirin.        Brandy [name]  02/18/2016  11:40 AM

## 2016-02-17 NOTE — Discharge Summary (Signed)
Cardiovascular and Thoracic Surgery  [facility]  [address]  [place], [place] [address]  [phone]  Discharge Summary    Patient ID:  Name: Brandy [name]  DOB: 12/18/1955 60 y.o.  MRN: [id number]  CSN: [id number]    Admit date: 01/23/2016    Discharge date: 02/17/2016    Admission Diagnosis: Respiratory failure (CMS/HCC) [J96.90]     History of present illness:  Clyda Smyth is a 60 year old female who initially presented to the [facility] falls respiratory distress and unable to breathe. Her troponin there was elevated and she was given Lasix and oxygen and eventually began to feel better. Cardiac workup showed normal coronaries with depressed LV function of 30-35% with severe aortic stenosis with a valve area less than 0.4, velocity 4.4 m/s. She was transfused with 2 units of packed red blood cells in [place]. She was transferred to [facility] and referred for aortic valve replacement.  It should be noted that she apparently lives remotely and has not seen a doctor in many years, does not take medications and has poor dentition.    PREOPERATIVE DIAGNOSES:    1. Severe symptomatic aortic stenosis  ??????????????????????Ejection fraction 15%  ??????????????????????Normal coronaries  ??????????????????????Predominant symptom dyspnea???????????????????  2. Severe Pulmonary Hypertension  3. Mild to Moderate Mitral regurgitation  4. Morbid obesity  ??????????????????????Weight loss 50 pounds in the last one year  5. respiratory insufficiency  6.?Non-ST elevation MI  ??????????????????????The demand ischemia  7. Iron deficiency anemia  ??????????????????????CT chest abdomen and pelvis reviewed no abdominal masses  ??????????????????????Likely AVM secondary to aortic stenosis not a good candidate for sedation and EGD or colonoscopy well aortic stenosis is so critical    Discharge Diagnoses:   Active Hospital Problems    Diagnosis SNOMED CT(R) Date Noted   . Aortic  valve disorder AORTIC VALVE DISORDER 02/17/2016   . Acute renal failure with tubular necrosis (CMS/HCC) ACUTE RENAL FAILURE SYNDROME 02/13/2016   . Heparin induced thrombocytopenia (CMS/HCC) HEPARIN-INDUCED THROMBOCYTOPENIA 02/02/2016   . S/P AVR (aortic valve replacement) HISTORY OF AORTIC VALVE REPLACEMENT 01/27/2016   . Respiratory failure (CMS/HCC) RESPIRATORY FAILURE 01/23/2016   . Aortic stenosis, severe AORTIC VALVE STENOSIS 01/22/2016   . NSTEMI (non-ST elevated myocardial infarction) (CMS/HCC) ACUTE NON-ST SEGMENT ELEVATION MYOCARDIAL INFARCTION 01/21/2016   . Demand ischemia of myocardium (CMS/HCC) ACUTE ISCHEMIC HEART DISEASE 01/21/2016   . Iron deficiency anemia IRON DEFICIENCY ANEMIA 01/21/2016   . Obesity OBESITY 01/21/2016       Procedures/Treatments: Procedure(s):  AORTIC VALVE REPLACEMENT; INTRA AORTIC BALLOON INSERTION    01/27/2016    OPERATIVE PROCEDURE:  1.Aortic Valve Replacement   2.Intra-Aortic Balloon Pump insertion                        Right femoral  3.Pleural and Pericardial Drains  4.Ventricular and Atrial Temporary Pacing wires  5.TEE GUIDED IABP  6.BIOMET STERNAL PLATES   IMPLANTS:  23mm MAGNA Edwards Pericardial Tissue  40cc IABP  Ejection fraction:  Preoperative EF 15%; same immediately postoperatively  Please refer to the full dictated operative note by Dr. Otho Bellows    02/09/2016  Procedure:  Cardioversion for severe symptomatic atrial fibrillation with RVR  Dr. Abbey Chatters Course: Below is a summary of her postoperative course:  POD #1  Patient has been stable on IABP 1:2   Methylene blue last night SVR much better   Coronary perfusion good   Today I  am managing acute heart failure with management of the IABP.    POD #2  removal of IABP, renal ultrasound completed showing minimal flow to the kidneys  patient still making urine  POD #3 CRRT started  POD #4 CRRT running acid base more stable pressures much better off NEO and LEVOPHED improved making urine  POD#5  CRRT continues   Patient plt 25   Will check for DVT   Postoperative day #6 CRRT continues platelets now up to 32 treating for heparin-induced from thrombocytopenia  Postoperative day #7 Trialysis Cath replaced secondary to malfunction.  On argatroban still hemodynamics improving oxygen requirements lower.  Postoperative day #8 making transition to hemodialysis   Postoperative day #9 +12 liters still  Will need HD likely     POD #10:  11.8 L positive. Planning hemodialysis today.  remains in A. Fib on the amiodarone protocol.  POD#11: 8 liters positive.  No other changes  POD#12: still in AFIB rates 110-130   Multiple amiodarone loads without success...digoxin not good option secondary to renal failure  POD#13 doing well  Cardioversion by Dr. Truddie Hidden  POD#14 no major issues   In NSR  POD#15 NSR doing well needs rehab   POD#16 NSR looked good with PT yesterday getting stronger ischemic toes feel much better   POD#17 NSR continues on argatroban INR 2.8  POD#18 NSR  INR 3.2  POD#19 NSR  INR 4.0   POD #20 sinus rhythm, INR 4.2; hypokalemic 3.3; 24 hour urine 2475  POD #21:  NSR, INR 2.8, feeling well and making good but slow progress;       Physical Exam:    ? General: Alert and Oriented x 3    ? CV: RRR    ? Lungs: CTA     ? Abdomen: soft non tender     ? Incision: Chest wound: Clean dry and intact     ? Neuro: Cranial nerves II-XII intact    ? Extremities: 1+ Edema     ? Distal pulses intact         Labs:  Results for orders placed or performed during the hospital encounter of 01/23/16 (from the past 24 hour(s))   Potassium -Timed    Collection Time: 02/16/16  3:30 PM   Result Value Ref Range    Potassium 4.0 3.5 - 5.1 mmol/L   Protime Panel -Now & Q AM    Collection Time: 02/16/16  3:30 PM   Result Value Ref Range    Protime 43.5 (H) 10.0 - 13.0 Seconds    INR 3.8 (H) 0.9 - 1.2   APTT -Daily    Collection Time: 02/17/16  4:33 AM   Result Value Ref Range    APTT 38 (H) 23 - 36 Seconds   Renal Function Panel -Daily     Collection Time: 02/17/16  4:33 AM   Result Value Ref Range    Albumin 2.3 (L) 3.5 - 5.0 g/dL    Calcium 8.5 (L) 8.6 - 10.3 mg/dL    Phosphorus 1.8 (L) 2.5 - 4.5 mg/dL    BUN 10 6 - 23 mg/dL    Creatinine 4.54 0.98 - 1.10 mg/dL    Sodium 119 147 - 829 mmol/L    Glucose 111 (H) 80 - 99 mg/dL    Potassium 3.4 (L) 3.5 - 5.1 mmol/L    Chloride 111 98 - 111 mmol/L    CO2 - Carbon Dioxide 22.6 21.0 - 31.0 mmol/L    Anion Gap  7.4 3.0 - 11.0 mmol/L    GFR Estimate >60 >=60 mL/min/1.73m*2    GFR Additional Info     Protime Panel -Now & Q AM    Collection Time: 02/17/16  4:33 AM   Result Value Ref Range    Protime 31.5 (H) 10.0 - 13.0 Seconds    INR 2.8 (H) 0.9 - 1.2   Comprehensive Metabolic Panel -AM Draw    Collection Time: 02/17/16  4:33 AM   Result Value Ref Range    Sodium 141 135 - 143 mmol/L    Potassium 3.4 (L) 3.5 - 5.1 mmol/L    Chloride 111 98 - 111 mmol/L    CO2 - Carbon Dioxide 22.6 21.0 - 31.0 mmol/L    Glucose 111 (H) 80 - 99 mg/dL    BUN 10 6 - 23 mg/dL    Creatinine 1.47 8.29 - 1.10 mg/dL    Calcium 8.5 (L) 8.6 - 10.3 mg/dL    AST - Aspartate Aminotransferase 25 8 - 39 IU/L    ALT - Alanine Amino transferase 20 7 - 52 IU/L    Alkaline Phosphatase 88 34 - 104 IU/L    Bilirubin Total 0.7 0.3 - 1.2 mg/dL    Protein Total 5.2 (L) 6.0 - 8.0 g/dL    Albumin 2.3 (L) 3.5 - 5.0 g/dL    Globulin 2.9 2.2 - 3.7 g/dL    Albumin/Globulin Ratio 0.8 (L) >0.9    Anion Gap 7.4 3.0 - 11.0 mmol/L    GFR Estimate >60 >=60 mL/min/1.51m*2    GFR Additional Info     CBC with Auto Differential -AM Draw    Collection Time: 02/17/16  4:33 AM   Result Value Ref Range    WBC 7.5 4.8 - 10.8 10*3/?L    RBC 3.37 (L) 4.20 - 5.40 10*6/?L    Hemoglobin 8.7 (L) 12.0 - 16.0 g/dL    HCT 56.2 (L) 13.0 - 48.0 %    MCV 79.8 (L) 81.0 - 99.0 fL    MCH 26.0 (L) 27.0 - 34.0 pg    MCHC 32.5 32.0 - 36.0 g/dL    RDW 86.5 (H) 78.4 - 14.5 %    Platelet Count 240 150 - 400 10*3/?L    MPV 7.4 7.4 - 10.4 fL    Neutrophils % 81 (H) 35 - 70 %    Lymphocytes % 13  (L) 25 - 45 %    Monocytes % 5 0 - 12 %    Eosinophils % 1 0 - 7 %    Basophils % 0 0 - 2 %    Neutrophils, Absolute 6.1 1.6 - 7.3 10*3/?L    Lymphocytes, Absolute 1.0 (L) 1.1 - 4.3 10*3/?L    Monocytes, Absolute 0.4 0.0 - 1.2 10*3/?L    Eosinophils, Absolute 0.1 0.0 - 0.7 10*3/?L    Basophils, Absolute 0.0 0.0 - 0.2 10*3/?L    Differential Type Manual Differential     Platelet Estimate Normal Normal    Anisocytosis 3+ (A) (none)    Microcytes 1+ (A) (none)       Discharge Medications:     Patient's Discharge Medication List      STOP taking these medications          ALEVE 220 MG tablet   Generic drug:  naproxen sodium           Discharge orders/labs:  Latest echocardiogram on 02/02/16 showed estimated EF at 30-35% with 2+ mitral regurg. Due to her severe  pulmonary hypertension, morbid obesity, end STEMI from demand ischemia, iron deficiency anemia, preoperative diminished ventricular function of 15% she required IABP and has had a challenging postoperative course, as noted above. Subsequently she has been making slow but steady progress and was evaluated by Dr. Myrtie Neither from [facility] and accepted for continued physical therapy.   She is not to have any heparin or Lovenox.   We have restarted her on Coumadin anticoagulation (currently 4 mg daily) for her prosthetic valve with the therapeutic INR to be between 2.0 and 3.0 and had a restarted and 81 mg enteric-coated baby aspirin daily. Her INR should be checked at least twice a week for the first couple of weeks.  We will also continue diuresis with 40 mg by mouth Lasix daily and 20 mEq of potassium. She'll probably have weekly BMPs to monitor electrolytes and renal function. Should continue Coreg 6.25 twice a day. And continue a tapering dose of amiodarone.  She has follow-up appointment with PA and lateral CXR, BMP, and CBC in approximately 2-3 weeks.      Patient Instructions/Follow-up Appointments:      1. Patient is advised to observe sternal precautions and  not lift more than 10 pounds for another 6 to 8 weeks.  2. They are advised to avoid driving until they're 1 month out from their surgery.  3.  They may shower when home, but should avoid immersing their surgical wounds under water until 1 month out from the surgery.  4. They're encouraged to undertake some form of reconditioning exercises, mostly in the form of walking and remaining as active as possible on a daily basis.  5.  I would like to see the patient back in my office in 2 to 3 weeks for followup.    I have reviewed all discharge instructions with the patient and answered all of their questions.  They appear to understand and agree to proceed in this manner. If they have any questions or concerns prior to their followup visit they will call our office.    Diet: Regular heart healthy  Discharge Disposition: To facility: [facility]  Follow-up with CVT Surgery in 2-3 weeks.     LOS: 25 days     Signed:  Brayton Caves, PA  02/17/2016  11:09 AM

## 2016-02-17 NOTE — Discharge Planning (AHS/AVS) (Signed)
Call from [facility] admission coordinator Misty Stanley. They have insurance auth and a bed, awaiting pacing wores cut and discharge orders. Bedside RN and PA informed.

## 2016-02-18 LAB — BASIC METABOLIC PANEL
Anion Gap: 6.7 mmol/L (ref 3.0–11.0)
BUN: 9 mg/dL (ref 6–23)
CO2 - Carbon Dioxide: 24.3 mmol/L (ref 21.0–31.0)
Calcium: 9.1 mg/dL (ref 8.6–10.3)
Chloride: 111 mmol/L (ref 98–111)
Creatinine: 0.75 mg/dL (ref 0.55–1.10)
GFR Estimate: >60 mL/min/{1.73_m2} (ref >=60)
Glucose: 88 mg/dL (ref 80–99)
Potassium: 3.9 mmol/L (ref 3.5–5.1)
Sodium: 142 mmol/L (ref 135–143)

## 2016-02-18 LAB — PROTIME-INR
INR: 3.1 — ABNORMAL HIGH (ref 0.9–1.2)
Protime: 34.7 Seconds — ABNORMAL HIGH (ref 10.0–13.0)

## 2016-02-18 MED ORDER — sodium chloride 0.9 % (NS) syringe 5-10 mL
INTRAMUSCULAR | Status: DC | PRN
Start: 2016-02-18 — End: 2016-02-21

## 2016-02-18 MED ORDER — sodium chloride 0.9 % (NS) syringe 5-10 mL
Freq: Every day | INTRAMUSCULAR | Status: DC
Start: 2016-02-18 — End: 2016-02-22
  Administered 2016-02-18 – 2016-02-20 (×3): via INTRAVENOUS

## 2016-02-18 MED ORDER — warfarin on hold
ORAL | Status: DC
Start: 2016-02-18 — End: 2016-02-19

## 2016-02-18 MED FILL — CARVEDILOL 3.125 MG TABLET: 3.125 mg | ORAL | Qty: 2

## 2016-02-18 MED FILL — AMIODARONE 200 MG TABLET: 200 mg | ORAL | Qty: 2

## 2016-02-18 MED FILL — ASPIRIN 81 MG TABLET,ENTERIC COATED: 81 mg | ORAL | Qty: 1

## 2016-02-18 MED FILL — FAMOTIDINE 20 MG TABLET: 20 mg | ORAL | Qty: 1

## 2016-02-18 MED FILL — COUMADIN 2 MG TABLET: 2 mg | ORAL | Qty: 1

## 2016-02-18 MED FILL — KLOR-CON M10 MEQ TABLET,EXTENDED RELEASE: 10 meq | ORAL | Qty: 2

## 2016-02-18 MED FILL — ATORVASTATIN 20 MG TABLET: 20 mg | ORAL | Qty: 1

## 2016-02-18 MED FILL — COUMADIN 1 MG TABLET: 1 mg | ORAL | Qty: 1

## 2016-02-18 MED FILL — FUROSEMIDE 40 MG TABLET: 40 mg | ORAL | Qty: 1

## 2016-02-18 NOTE — Discharge Planning (AHS/AVS) (Addendum)
DISCHARGE PLANNING ASSESSMENT      Primary Care Physician: NO FAMILY PHYSICIAN,   Brandy [name] agrees to allow [place] to get her a [place] APP PCP   Confirmed patients contact info and PCP: No PCP.  PO [address]OX, no actual physical address, [address], 20 miles out of [place].  Home Health will need to plot a route.     Plan: Home    Patient/Family Goal: Home  Who is to be included in care planning?: Patient and spouse  ASSESSMENT   Goals of Care Conversation:          General Observations/Barriers:  Impaired mobility and self care, decreased strength and endurance, 5 steps into home  No PCP  No consistent use of medications    Prior Level of Function: Independent  Potential Risk Factors: Change in care needs, Severity of injury/illness, Inadequate/lacking support  Home Environment: Single level, Entry railing  Number of Steps: 4-5 bilateral rails  Mentation: Oriented  Mobility: Independent     Support Systems/Services (Current)  Living Arrangements: Spouse/significant other  Type of Residence: Private residence  Support Systems: Spouse/significant other, Family members, Friends/neighbors                      INTERVENTIONS   Anticipated additional equipment needs: TBD   Do you anticipate patient will need medical transport at discharge?: No       Additional information: Brandy [name] agrees to using a [place] APP PCP. Needs a TTB, will try to obtain through Medicaid.      Discharge Planning :Brandy Noel, RN

## 2016-02-18 NOTE — Progress Notes (Signed)
Vascular access consult. TLC PICC has been in for 16 days. ? Change to midline

## 2016-02-18 NOTE — Progress Notes (Signed)
RESPIRATORY ASSESSMENT    SUBJECTIVE: Patient sitting in bed no respiratory complaints does not use bronchodilators, 02 or cpap at home        OBJECTIVE:   Current Vital Signs:    Pre and post C/S: clear   HR:  80   RR:  18              Cough: fnpc   SpO2:   97% on Ra               IS: .                 CXR: 9/6FINDINGS:  The patient has had prior sternotomy. The heart is upper limits of   normal. Small pleural effusions and basal atelectasis are present. No   pneumothorax. Support lines and tubes are stable.  ?  ?  IMPRESSION:    Sternotomy and valve replacement.  ?  Stable chest findings.  ?  ?     ASSESSMENT:  Bronchodilators not indicated, patient is using IS and Acapella on her own       PLAN: DC prn [place] Albuterol reassess as needed       GOAL(S):            Supplemental Oxygen:   Maintain SpO2 >  90 %  Normalize and maintain an adequate SpO2/PaO2 appropriate for patient clinical situation    Per PDP # 102 and MD orders    E.S.  Elvera Lennox, RRT

## 2016-02-18 NOTE — Progress Notes (Signed)
PMR Physician Daily Note     General and Subjective:   Feels very well this am.  Excellent participation so far.       Observations and Exam:  Vital Sign Ranges for Last 24 Hours:  BP  Min: 107/64  Max: 154/84  Temp  Min: 36.5 ?C (97.7 ?F)  Max: 36.7 ?C (98 ?F)  Pulse  Min: 80  Max: 91  Resp  Min: 18  Max: 18  SpO2  Min: 96 %  Max: 97 %    Alert, no distress  Speech clear  Neck supple  Heart is RRR  Lungs are clear  Abdomen is obese, soft, non-tender  Still has considerable bilateral lower limb edema, worse on left, but overall less.    Areas of ecchymosis on fingers and toes unchanged.       Recent Glucose Monitoring  Lab Results   Component Value Date    POCGLU 111 (H) 02/15/2016    POCGLU 118 (H) 02/10/2016    POCGLU 91 02/10/2016    POCGLU 162 (H) 02/10/2016    POCGLU 102 (H) 02/10/2016           Intake/Output Summary (Last 24 hours) at 02/18/16 1140  Last data filed at 02/18/16 1100   Gross per 24 hour   Intake             1010 ml   Output             1775 ml   Net             -765 ml       Recent Labs  Results for orders placed or performed during the hospital encounter of 02/17/16 (from the past 24 hour(s))   Protime Panel -AM Draw    Collection Time: 02/18/16  7:34 AM   Result Value Ref Range    Protime 34.7 (H) 10.0 - 13.0 Seconds    INR 3.1 (H) 0.9 - 1.2   Basic Metabolic Panel -AM Draw    Collection Time: 02/18/16  7:34 AM   Result Value Ref Range    Sodium 142 135 - 143 mmol/L    Potassium 3.9 3.5 - 5.1 mmol/L    Chloride 111 98 - 111 mmol/L    CO2 - Carbon Dioxide 24.3 21.0 - 31.0 mmol/L    Glucose 88 80 - 99 mg/dL    BUN 9 6 - 23 mg/dL    Creatinine 1.61 0.96 - 1.10 mg/dL    Calcium 9.1 8.6 - 04.5 mg/dL    Anion Gap 6.7 3.0 - 11.0 mmol/L    GFR Estimate >60 >=60 mL/min/1.42m*2    GFR Additional Info         [x]     I have reviewed the Rehabilitation  Interdisciplinary Notes.   Medical/Function Problems:   Patient Active Problem List    Diagnosis SNOMED CT(R) Date Noted   . Aortic valve disorder  AORTIC VALVE DISORDER 02/17/2016   . Impaired gait and mobility ABNORMAL GAIT 02/17/2016   . Impaired activities of daily living ACTIVITY OF DAILY LIVING (ADL) ALTERATION 02/17/2016   . Peripheral edema PERIPHERAL EDEMA 02/17/2016   . Ischemia of digits of hands and feet UPPER LIMB ISCHEMIA 02/17/2016   . Blood loss anemia ANEMIA DUE TO BLOOD LOSS 02/17/2016   . Hypokalemia HYPOKALEMIA 02/17/2016   . Acute renal failure with tubular necrosis (CMS/HCC) ACUTE RENAL FAILURE SYNDROME 02/13/2016   . Heparin induced thrombocytopenia (CMS/HCC) HEPARIN-INDUCED THROMBOCYTOPENIA 02/02/2016   .  S/P AVR (aortic valve replacement) HISTORY OF AORTIC VALVE REPLACEMENT 01/27/2016   . Non-ischemic cardiomyopathy (CMS/HCC) CARDIOMYOPATHY 01/23/2016   . Respiratory failure (CMS/HCC) RESPIRATORY FAILURE 01/23/2016   . Aortic stenosis, severe AORTIC VALVE STENOSIS 01/22/2016   . Acute systolic heart failure (CMS/HCC) ACUTE SYSTOLIC HEART FAILURE 01/22/2016   . Heart failure (CMS/HCC) HEART FAILURE 01/21/2016   . Elevated brain natriuretic peptide (BNP) level HORMONE LEVEL - FINDING 01/21/2016   . NSTEMI (non-ST elevated myocardial infarction) (CMS/HCC) ACUTE NON-ST SEGMENT ELEVATION MYOCARDIAL INFARCTION 01/21/2016   . Demand ischemia of myocardium (CMS/HCC) ACUTE ISCHEMIC HEART DISEASE 01/21/2016   . Tachycardia TACHYCARDIA 01/21/2016   . Pleural effusion PLEURAL EFFUSION 01/21/2016   . Obesity OBESITY 01/21/2016   . Acute respiratory failure with hypoxia (CMS/HCC) ACUTE RESPIRATORY FAILURE 01/21/2016   . Hypotension LOW BLOOD PRESSURE 01/21/2016   . Iron deficiency anemia IRON DEFICIENCY ANEMIA 01/21/2016   . Thrombocytosis (CMS/HCC) THROMBOCYTOSIS 01/21/2016   . Weight loss WEIGHT DECREASED 01/21/2016   . Family history of cancer FAMILY HISTORY OF CANCER 01/21/2016   . Cardiomegaly CARDIOMEGALY 01/21/2016   . Mediastinal lymphadenopathy MEDIASTINAL LYMPHADENOPATHY 01/21/2016   . Hilar lymphadenopathy- bilataeral  HILAR  LYMPHADENOPATHY 01/21/2016   . Chest pain CHEST PAIN 01/21/2016   . Dyspnea and respiratory abnormality DYSPNEA 01/21/2016   . Abnormal EKG ELECTROCARDIOGRAM ABNORMAL 01/21/2016   . Opacity of lung on imaging study ABNORMAL FINDINGS ON DIAGNOSTIC IMAGING OF LUNG 01/21/2016   . Liver lesion- small hypodense lesion around falciform lig LESION OF LIVER 01/21/2016       Impression and Plan:    60 year-old woman who has had bioprosthetic aortic valve replacement. ?Her hospitalization characterized by multiple severe issues including HIT, atrial fib, ARF, CHF, Non-STEMI, severe blood-loss anemia (thought to be from GI source), dysphagia, persistent pulmonary insufficiency, and general weakness and deconditioning. ?Continues with marked edema in both lower limbs, though improving. ?Has multiple areas of ischemic changes in fingers and toes, overall of moderate severity, no evidence of infection at this time. ?  . Peripheral edema:  Rather marked in lower limbs.  Continue gentle diuresis, caution to not over-diurese or allow hypokalemia, dehydration, etc.  Labs stable this am, continue lasix   . Ischemia of digits of hands and feet:  Continues to demarcated, WOCN following, will monitor.  This is due to pressors and shock   . Blood loss anemia: moderate to marked, stable, will continue to monitor.    . Hypokalemia:  Replace, in better range now, monitor.    . Heparin induced thrombocytopenia:  Continue coumadin, currently in supra-therapeutic range   . S/P AVR (aortic valve replacement):  As above. Maintain and reinforce sternotomy precautions   . Non-ischemic cardiomyopathy:  Continue with coreg.  She's also on amiodarone, will wean as directed by CT surgery service.    . Demand ischemia of myocardium:  Resolved at this time, continue coreg and aspirin.              [x]     Patient remains appropriate for inpatient rehabilitation facility level of care, continue present therapies and medical treatment and monitoring.    []      See orders for any treatment modifications     Hansel Feinstein  02/18/2016 11:40 AM

## 2016-02-18 NOTE — Treatment Summary (Signed)
[facility] PT TREATMENT NOTE    [name] / 2303/2303-01 DOB:  1956/04/15 / 60 y.o. Date:  02/18/2016     Precautions  Specific mobility precautions: Sternal precautions  Sternal precautions: Standard  Other: Very sore (R) 3rd toe; Ejection fraction estimated to be 30-35%     ASSESSMENT     Summary:  Noted for good improvement in stamina for functional tasks today compared to yesterday's evaluation. With training and practice, pt was able to perform sit <==> stand with CGA/SBA after earlier requiring min/mod assist. Improved gait distances today, with trials ranging from 101-230 ft using 4WW (min A to bring w/c along for seated rest breaks). Anticipate pt will continue to make good overall progress in this intensive therapy setting.      Activity tolerance: Tolerates 30 min activity  Comments: noted for mild fatigue today; certainly improved compared to yesterday     Precaution Awareness: Fully aware of precaution(s)  Deficit Awareness: Fully aware of deficits  Correction of Errors: Self-corrects with cues  Safety Judgment: Good awareness of safety     DME Needs:   DME Needs: 1OX     PLAN     Intensity/Frequency:  Duration:   Intensity/frequency: 90 minutes daily, 5x/week  14 days      Continue intensive level of Physical therapy to address progressive mobility training, caregiver training and DME needs.          Treatment Diagnosis Surgery (if applicable)   Primary Dx: Acute cardio-pulmonary failure; severe aortic stenosis which required aortic valve replacement  Treatment Dx: Impaired mobility  Procedure/Date: 01/27/2016: Aortic valve replacement       SUBJECTIVE     Resting comfortably in bed on my arrival (for both sessions); denies pain and eager to participate. Slept pretty well last night and is very pleased to be on the rehab unit.      Pain Level  Current status: Pain does not limit pt's ability to participate in therapy  Pain at start of session: 0 - No Pain  Pain at end of session: 0 - No Pain          OBJECTIVE        Mental status:   General Demeanor: Pleasant, Cooperative  LOC: Alert  Orientation: Oriented x 4  --> Follows one-step commands: Consistently  Attention span: Appears intact  Memory: Appears intact in social/therapy situations   Medical appliances:   Medical Appliances  Medical Appliances: None       TREATMENT    Bed Mobility/Transfers  Bed mobility       Rolling / logroll  Roll L: Min Assist;Mod Assist      Transition to sitting up Sidelying to Sit: Min Assist;Mod Assist (with bed flat, uses rail slightly)   To resting position      Transition to sitting down CGA;Verbal cues     Transition to supine Min Assist;Mod Assist;Verbal cues (via logroll technique)     Scooting     Transfers      Transition to standing From bed: Min Assist;Mod Assist;Verbal cues (though progresses to CGA with education)  From wheelchair: Min Assist;Mod Assist;Verbal cues (though progresses to CGA with education)     Method Stand pivot: Min Assist;Verbal cues (using [id number])     Transfer training:  Repetitive sit <==> stand training in order to decrease external assistance for this transfer. Initially pt required min/mod assist but was able to progress to CGA/SBA by end of training. Instructed on technique  of placing hands on knees, rocking trunk forward with nose over toes and utilizing forward momentum of trunk to achieve standing position.    Gait Jackie Plum training:   Mobility - Gait (Level)  Gait (distance): 101 feet (trials of 191 ft, 101 ft and 230 ft)  Gait (assist level): CGA;Verbal cues;Min Assist (min A to bring w/c)  Assistive device: Four wheeled walker  Endurance: fair; improved compared to yesterday  Pace: moderate  Pattern: Downward gaze;Wide BOS;Decreased step length L;Decreased step length R  Surface: Level tile  Mobility - Gait (Stairs/curbs)  Number of Stairs: 4  Step height: 6  Pattern: Step to  Assist level: Min assist;Verbal cues  Assistive device: Hand railing bilaterally    Wheelchair  mobility:  Propulsion method: Manual  Manual: Bilateral LE's  Propulsion distance: several trials of 20-45 ft each (limited by burning in hamstrings; LE fatigue)  Assist level: SBA (to occasional min A)  Surface: Level interior;Tile   Ther. Ex.:   Heelslides: 10x bil with minimal assist  Short arc quads: 15x bilat  Addit. Exercises: NuStep x 7 minutes and 5 minutes (1-2 minute rest period between trials); LEs only, L1 resistance     Balance training:   Balance - Basic  Assist level: Distant Supervision  Position: Feet - supported;Surface - Level  Time: 5+ min   Training/Education:  Primary Learner: Patient  Training provided: Improved strategy for sit ==> stand transfer using hands on knees; Manual w/c mobility using feet for propulsion  Response to training: Very receptive   Safety/Room set-up:     Call button accessible?: Yes  Phone accessible?: Yes  Oxygen reconnected?: No, on room air  Patient mobility in room: Able to be up with staff (x 1).  Position on arrival: In bed  Position on departure (bed): Bed alarm on;Bed in lowest position;Standard bed;3 bedrails up     GOALS     Patient/Caregiver goals reviewed and integrated with rehab treatment plan:     Multidisciplinary Problems (Active)        Problem: Caregiver Training    Goal Priority Disciplines Outcome   Caregivers to demonstrate safe patient assist, as needed     PT           Problem: DME    Goal Priority Disciplines Outcome   Durable Medical Equipment needs to be met     PT           Problem: Mobility: Ambulation    Goal Priority Disciplines Outcome   Gait (with precautions)     PT    Description:  Pt. to ambulate 150 feet at modified independent level, following sternal precautions, using 4WW.    Stairs (with precautions)     PT    Description:  Pt. to ascend/descend 6 stairs with SBA, using (B) rails, following sternal precautions.            Problem: Mobility: Transfers    Goal Priority Disciplines Outcome   Supine to/from sitting (with  precautions)     PT    Description:  Pt. to transfer to/from sitting at modified independent level, following sternal precautions.    Bed to/from chair (with precautions)     PT    Description:  Pt. to transfer bed to/from chair at modified independent level, following sternal precautions.           Problem: Patient/Family Goal    Goal Priority Disciplines Outcome   Home     PT    Description:  Pt. wants to return to previous living situation.           Problem: Precautions    Goal Priority Disciplines Outcome   Application of Precautions     PT    Description:  Pt. to verbally recall and follow all pertinent precautions, including sternal precautions.                  First session Second session (if applicable)   Start/Stop times: 0915 - 1000 Start/Stop times: 1100 - 1145   Total time: 45 minutes  Total time: 45 minutes      Therapist:  Jannifer Hick, PT

## 2016-02-18 NOTE — Progress Notes (Signed)
Wound care referral for right toes and right index finger eschar.  All eschar was painted using iodine and open to air to dry.  There is a partially deflated blister on the dorsum close to the base of her toes and this was also painted using iodine.   The patient states all of the black areas are getting lighter in color and even new pink areas especially on her toes.  Coordinated care with Burman Foster.  Instructed just for today to go ahead and paint her finger and blister on dorsum of foot again today.                    No billed items.

## 2016-02-18 NOTE — Treatment Summary (Signed)
[facility] OT TREATMENT NOTE    [name] / 2303/2303-01 DOB:  08-Dec-1955 / 60 y.o. Date:  02/18/2016     Precautions  Specific mobility precautions: Sternal precautions  Sternal Precautions: Standard     ASSESSMENT     Summary:  Pt works very hard.  She has decreased/poor activity tolerance and difficulty with doing sit to stands.  She is on sternal precautions and not using her arms impedes her sit to stands.  She does not do any tasks with lower extremeties: I.e.: bathing, dressing, pulling up pants; she did better with these tasks with encouragement.  A lot of edema with LE's; pt reports being unable to get shoes on at this time due to swelling.       Activity Tolerance:    Activity tolerance: Tolerates 30 min activity with multiple rests  Comments: Pt quite fatigued   Safety Awareness:     Precaution Awareness: Decreased awareness of precaution(s)  Deficit Awareness: Fully aware of deficits  Safety Judgment: Good awareness of safety     PLAN     Treatment Plan Frequency     Brandy [name] will benefit from Treatment Plan: Continue per established POC.  Pt. demonstrates limitations which require skilled OT intervention.. Intensity/Frequency: 90 minutes daily, 5x/week     Brandy [name] will remain on the Occupational Therapy schedule until goals are met, or there has been a change in the Plan of Care.          Treatment Diagnosis Surgery (if applicable)    Primary Dx: Respriatory failure, s/p AVR       SUBJECTIVE   Pt states she cannot put shoes on due to the swelling in her feet.     Pain Level  Current status: Pain does not limit pt's ability to participate in therapy  Pain at start of session: 0 - No Pain  Pain at end of session: 0 - No Pain     OBJECTIVE   Improve pt's independence with ADL's, with focus on UB and LB dressing, shower trf, bathing. Provided pt with clean G tubigrip for LE's. Covered with acquaguard PICC line and wound on her right forearm before bathing     Mental status  General Demeanor:  Pleasant;Cooperative  LOC: Alert  Directions: Follows one-step commands  --> Follows one-step commands: Consistently  Attention span: Appears intact  Memory: Appears intact in social/therapy situations  Safety judgment: Good awareness of safety precautions   Medical appliances   Medical Appliances: PICC line                                                                 Observation     Significant swelling in LE's.        TREATMENT  ADLs/Transfers     ADL   Assist Level/Comments   Eating (Meal prep, self feeding)     Grooming     Upper Body Dressing  (set up assist)   Lower Body Dressing Mod Assist (training with AE; assisted with socks; did not use shoes)   Bathing Mod Assist   Toileting     Toilet Transfer     Tub or Shower Transfer  Moderate assist        Training/Education  Trainee(s): Primary  Learner  Primary Learner: Patient  Training provided: ADL retraining with focus on using reacher for LB dressing  Response to training: Very receptive; liked the reacher and used it well for LB dressing; pt also pulled her sweat pants up herself when encouraged to do so   Safety/Room set-up   Call button accessible?: Yes  Phone accessible?: Yes  Position on arrival: In bed  Position on departure (bed): Bed alarm on;Bed in lowest position;Standard bed     GOALS     Patient/Caregiver goals reviewed and integrated with rehab treatment plan:     Multidisciplinary Problems (Active)        Problem: ADLs    Goal Priority Disciplines Outcome   ADL Activity Tolerance for Self Care     OT    Description:  Pt  to tolerate 10  minutes of self care activity in standing position    ADL Grooming     OT    Description:  Patient to be able to perform grooming with mod I assist.    ADL Toilet Transfers     OT    Description:  Patient to be able to perform toilet transfers with SBA.    ADL Tub Transfers     OT    Description:  Patient to be able to perform tub/shower transfers with SBA.    ADL Upper Body Bathing     OT    Description:   Patient to be able to perform upper body bathing with mod I assist.    ADL Lower Body Bathing     OT    Description:  Patient to be able to perform lower body bathing with min assist.    ADL Upper Body Dressing     OT    Description:  Patient to be able to perform upper body dressing with mod I assist.    ADL Lower Body Dressing     OT    Description:  Patient to be able to perform lower body dressing with min assist.    ADL Toileting     OT    Description:  Patient to be able to perform toilet hygiene with SBA.    Caregiver     OT    Description:  Caregiver will demonstrate the ability to assist with patient's care, as needed.           Problem: IP Post Cardiac Goal List    Goal Priority Disciplines Outcome   Precautions     OT    Description:  Patient to follow sternal  precautions, while performing ADLs.           Problem: Safety    Goal Priority Disciplines Outcome   Safety DME     OT    Description:  Pt/Caregiver to demonstrate awareness of DME needs for safety upon discharge.                  First session Second session (if applicable)    Start/Stop times: 1330 - 1500 Start/Stop times:   - Stop Time:     Total time: 90 minutes  Total time:   minutes     Therapist:  Marilynn Latino, OT

## 2016-02-18 NOTE — Progress Notes (Signed)
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 admitted on 02/17/2016 with a NSTEMI s/p R&L heart catheterization. PICC line placed 8/25. CRRT from 8/20-8/25. 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     Allergies: Heparin and Sulfa (sulfonamide antibiotics)  PMH:  has a past medical history of History of blood transfusion.   Active problems:   ? Heparin induced thrombocytopenia  ? S/P Aortic valve replacement     OBJECTIVE:     Height:   Admit weight: 102.1 kg (225 lb 1.4 oz) Last charted weight: 102.1 kg (225 lb 1.4 oz) IBW:   kg Body mass index is 36.33 kg/(m^2).    Recent Labs      02/16/16   0517   02/17/16   0433  02/17/16   1136  02/18/16   0734   NA  142   --   141  141   --   142   K  3.3*   < >  3.4*  3.4*  4.0  3.9   CL  112*   --   111  111   --   111   CO2  24.5   --   22.6  22.6   --   24.3   GLU  86   --   111*  111*   --   88   BUN  13   --   10  10   --   9   CREATININES  0.92   --   0.79  0.79   --   0.75   CALCIUM  8.6   --   8.5*  8.5*   --   9.1   AST   --    --   25   --    --    ALT   --    --   20   --    --    ALBUMIN  2.5*   --   2.3*  2.3*   --    --     < > = values in this interval not displayed.     Recent Labs      02/16/16   0517  02/17/16   0433   WHITEBLOODCE  8.9  7.5   NEUTROABS  6.8  6.1   HGB  8.6*  8.7*   HCT  27.5*  26.9*   LABPLAT  302  240     Warfarin Dosing Record:  Date: 8/31 9/1 9/2 9/3 9/4 9/5 9/6 9/7 9/8   INR:3.6 2.9 3 4.1 2.8 3.2 4.0 4.2 @ 0517  3.6 @ 1055  3.8 @ 1530 2.8 3.1   Warfarin: 3 mg 3 mg 2.5 3 mg 5 mg 5 mg Hold 3mg       ASSESSMENT:       Argatroban infusion:  Discontinued on 02/16/16  ? 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 Thrombosis in right lower extremity on 8/22  ? Argatroban at 0.9 mcg/kg/min - discontinued on 9/6 @  0640  ? APTT = 63 (goal 50 - 90); INR 4.0 (goal 4 - 6 w/ concurrent warfarin + argatroban)  ? Warfarin and agratrogan overlapped for 7 days    Recent vitamin  K administration: None noted in Epic   Warfarin Indication:  HIT w/ thrombosis in RLE on 8/22; Atrial fibrillation   Goal INR: 2-3 (4-6 while on argatroban)   Today's INR: Supratherapeutic   Home warfarin dose: N/A   Additional anticoagulant/antiplatelet:  Argatroban 0.9 mcg/kg/min   Comments about potential drug, disease or dietary interactions? Amiodarone, argatroban, acetaminophen (0 mg/24 hrs)   Warfarin education status: Completed on 02/16/16       PLAN:     Warfarin:  ? INR increased overnight from 2.8 to 3.1  ? Will hold warfarin today   ? Daily INR/PT ordered  ? Pharmacy to monitor and adjust dosing as needed    Signed:  Darrick Penna, [facility]

## 2016-02-19 LAB — PROTIME-INR
INR: 2 — ABNORMAL HIGH (ref 0.9–1.2)
Protime: 21.9 Seconds — ABNORMAL HIGH (ref 10.0–13.0)

## 2016-02-19 MED ORDER — amiodarone (PACERONE) tablet 200 mg
200 | Freq: Two times a day (BID) | ORAL | Status: DC
Start: 2016-02-19 — End: 2016-02-26
  Administered 2016-02-23 – 2016-02-26 (×8): 200 mg via ORAL

## 2016-02-19 MED ORDER — amiodarone (PACERONE) tablet 200 mg
200 | Freq: Every day | ORAL | Status: DC
Start: 2016-02-19 — End: 2016-02-26

## 2016-02-19 MED ORDER — amiodarone (PACERONE) tablet 400 mg
200 | Freq: Two times a day (BID) | ORAL | Status: AC
Start: 2016-02-19 — End: 2016-02-22
  Administered 2016-02-19 – 2016-02-22 (×7): 200 mg via ORAL

## 2016-02-19 MED ORDER — warfarin (COUMADIN) tablet 3 mg
1 | Freq: Every day | ORAL | Status: DC
Start: 2016-02-19 — End: 2016-02-22
  Administered 2016-02-20 – 2016-02-22 (×3): 1 mg via ORAL

## 2016-02-19 MED FILL — AMIODARONE 200 MG TABLET: 200 mg | ORAL | Qty: 2

## 2016-02-19 MED FILL — ASPIRIN 81 MG TABLET,ENTERIC COATED: 81 mg | ORAL | Qty: 1

## 2016-02-19 MED FILL — FAMOTIDINE 20 MG TABLET: 20 mg | ORAL | Qty: 1

## 2016-02-19 MED FILL — CARVEDILOL 3.125 MG TABLET: 3.125 mg | ORAL | Qty: 2

## 2016-02-19 MED FILL — FUROSEMIDE 40 MG TABLET: 40 mg | ORAL | Qty: 1

## 2016-02-19 MED FILL — ATORVASTATIN 20 MG TABLET: 20 mg | ORAL | Qty: 1

## 2016-02-19 MED FILL — KLOR-CON M10 MEQ TABLET,EXTENDED RELEASE: 10 meq | ORAL | Qty: 2

## 2016-02-19 NOTE — Treatment Summary (Signed)
[facility] OT TREATMENT NOTE    [name] / 2303/2303-01 DOB:  1955-12-15 / 60 y.o. Date:  02/19/2016     Precautions  Specific mobility precautions: Sternal precautions  Sternal Precautions: Standard     ASSESSMENT     Summary:  Pt is making good progress. She has progressed to stand by assist with toileting and is using a toilet aid well for toilet hygiene.  Demonstrated ability to use sock aid. Pt is getting stronger, with sit to stands much more stable and consistent. Occasional cues continue to be needed for sternal precautions. Overall pt demonstrates poor activity tolerance. Pt very hard worker.      Activity Tolerance:    Activity tolerance: Tolerates 30 min activity with multiple rests   Safety Awareness:     Precaution Awareness: Decreased awareness of precaution(s) (occasional verbal reminders for sternal precautions)  Safety Judgment: Good awareness of safety     PLAN     Treatment Plan Frequency     [name] will benefit from Treatment Plan: Continue per established POC.  Pt. demonstrates limitations which require skilled OT intervention.. Intensity/Frequency: 90 minutes daily, 5x/week     [name] will remain on the Occupational Therapy schedule until goals are met, or there has been a change in the Plan of Care.          Treatment Diagnosis Surgery (if applicable)    Primary Dx: Respriatory failure, s/p AVR       SUBJECTIVE   Pt states her husband won't let her do anything for 6 months after she gets home.      Pain Level  Current status: Pain does not limit pt's ability to participate in therapy  Pain at start of session: 0 - No Pain  Pain at end of session: 0 - No Pain     OBJECTIVE   Pt seen for ADL retraining, improving activity tolerance through sit to stands.      Mental status  General Demeanor: Pleasant;Cooperative  LOC: Alert  Directions: Follows one-step commands  --> Follows one-step commands: Consistently  Attention span: Appears intact  Memory: Appears intact in social/therapy  situations  Safety judgment: Good awareness of safety precautions   Medical appliances   Medical Appliances: PICC line                                                                     TREATMENT  Transfers/Bed Mobility  Transition  Assist Level/Comments   Rolling      --> Sitting up     --> Standing     Pivot Transfer     --> Sitting down     --> Supine Mod Assist   Scooting up in bed     ADLs/Transfers     ADL   Assist Level/Comments   Eating (Meal prep, self feeding)     Grooming SBA (in standing)   Upper Body Dressing     Lower Body Dressing     Bathing     Toileting SBA   Toilet Transfer  Stand by assist   Tub or Shower Transfer          Therapeutic activities   Sit to stands x's 5 with no difficulty  Training/Education  Trainee(s): Producer, television/film/video Learner: Patient  Training provided: ADL retraining; pt training on using a toilet aid for toilet aid and a sock aid for putting on socks  Response to training: Pt was able to use the toilet aid well and also demonstrated the ability to use the sock aid with very little effort   Safety/Room set-up   Call button accessible?: Yes  Phone accessible?: Yes  Position on arrival: In bed  Position on departure (upright): In wheelchair     GOALS     Patient/Caregiver goals reviewed and integrated with rehab treatment plan:     Multidisciplinary Problems (Active)        Problem: ADLs    Goal Priority Disciplines Outcome   ADL Activity Tolerance for Self Care     OT    Description:  Pt  to tolerate 10  minutes of self care activity in standing position    ADL Grooming     OT    Description:  Patient to be able to perform grooming with mod I assist.    ADL Toilet Transfers     OT    Description:  Patient to be able to perform toilet transfers with SBA.    ADL Tub Transfers     OT    Description:  Patient to be able to perform tub/shower transfers with SBA.    ADL Upper Body Bathing     OT    Description:  Patient to be able to perform upper body bathing with mod I  assist.    ADL Lower Body Bathing     OT    Description:  Patient to be able to perform lower body bathing with min assist.    ADL Upper Body Dressing     OT    Description:  Patient to be able to perform upper body dressing with mod I assist.    ADL Lower Body Dressing     OT    Description:  Patient to be able to perform lower body dressing with min assist.    ADL Toileting     OT    Description:  Patient to be able to perform toilet hygiene with SBA.    Caregiver     OT    Description:  Caregiver will demonstrate the ability to assist with patient's care, as needed.           Problem: IP Post Cardiac Goal List    Goal Priority Disciplines Outcome   Precautions     OT    Description:  Patient to follow sternal  precautions, while performing ADLs.           Problem: Safety    Goal Priority Disciplines Outcome   Safety DME     OT    Description:  Pt/Caregiver to demonstrate awareness of DME needs for safety upon discharge.                  First session Second session (if applicable)    Start/Stop times: 1000 - 1030 Start/Stop times: 1200 - Stop Time: 1230   Total time: 30 minutes  Total time: 30 minutes     Therapist:  Marilynn Latino, OT

## 2016-02-19 NOTE — Treatment Summary (Signed)
[facility] PT TREATMENT NOTE    [name] / [id number] DOB:  [birth year] / 60 y.o. Date:  02/19/2016     Precautions  Specific mobility precautions: Sternal precautions  Sternal precautions: Standard  Required Braces/Orthotics: None  Other: Very sore (R) 3rd toe; Ejection fraction estimated to be 30-35%     ASSESSMENT     Summary:  Steady progress towards goals. Improving activity tolerance, and gait distance. SBA/CGA for most mobility. Difficulty with step negotiation due to edema BLE with increased heaviness of limb, dec ROM. Beginning training of dynamic standing activities without AD. Requires min/CGA. Occasional cue for sternal precautions. Pt remains very motivated and pleasant to work with.     Activity tolerance: Tolerates 30 min activity with multiple rests     Precaution Awareness: Fully aware of precaution(s)  Deficit Awareness: Fully aware of deficits  Correction of Errors: Self-corrects with cues  Safety Judgment: Good awareness of safety     DME Needs:   DME Needs: 1OX     PLAN     Intensity/Frequency:  Duration:   Intensity/frequency: 90 minutes daily, 5x/week  14 days      Continue intensive level of Physical therapy to address progressive mobility training, caregiver training and DME needs.          Treatment Diagnosis Surgery (if applicable)   Primary Dx: Acute cardio-pulmonary failure; severe aortic stenosis which required aortic valve replacement  Treatment Dx: Impaired mobility  Procedure/Date: 01/27/2016: Aortic valve replacement       SUBJECTIVE   I know I need to get stronger, I want to do all I can     Pain Level  Current status: Pain does not limit pt's ability to participate in therapy  Pain at start of session: 0 - No Pain  Pain at end of session: 0 - No Pain     OBJECTIVE   Seen for transfers, gait and activity tolerance. amb SBA/vc with 4ww 180+ft. Standing balance in // bars, amb fwd and back without UE support, tap ups on step (alternating for wt shift), sidestepping.  BTB  at end of session, resting prior to OT.     Mental status:   General Demeanor: Pleasant, Cooperative  LOC: Alert  Orientation: Oriented x 4  Directions: Follows multi-step commands  --> Follows multi-step commands: With cues  --> Follows one-step commands: Consistently  Attention span: Appears intact  Memory: Appears intact in social/therapy situations   Medical appliances:   Medical Appliances  Medical Appliances: None         TREATMENT    Bed Mobility/Transfers  Bed mobility       Rolling / logroll  Roll L: SBA;Verbal cues      Transition to sitting up Sidelying to Sit: CGA   To resting position      Transition to sitting down CGA     Transition to supine Min Assist     Scooting Verbal cues;SBA   Transfers      Transition to standing From bed: CGA  From wheelchair: CGA     Method Stand pivot: CGA          Gait /Stair training:   Mobility - Gait (Level)  Gait (distance): 186 feet (Additional 118')  Gait (assist level): CGA;SBA  Assistive device: Four wheeled walker  Endurance: improving  Pace: moderate  Pattern: Downward gaze;Wide BOS;Decreased ankle DF L;Decreased ankle DF R;Decreased step length L;Decreased step length R  Surface: Level tile  Mobility - Gait (  Stairs/curbs)  Number of Stairs: 5 (5x2)  Step height: 6  Pattern: Step to  Assist level: Min assist;Verbal cues  Assistive device: Hand railing bilaterally      Ther. Ex.:     Biodex recumbent elliptical machine x 10 minutes, level 1 resistance, flat profile, bilateral UE/LE for interlimb coordination and conditioning.   Balance training:   Balance - Basic  Static sitting balance: Assist level;Position  Assist level: Distant Supervision  Position: Feet - supported  Static standing balance: Assist level  Assist level: SBA;CGA  Dynamic standing balance: Assist level  Assist level with weight shifts: CGA  Balance - Specific  Gait balance activities: Sidestepping;Walking backwards  Independence Level: CGA;Min Assist     Training/Education:  Trainee(s): Garment/textile technologist Learner: Patient  Engineer, production - Barriers to Learning?: No  Training provided: safety; pursed lipped breathing; pacing activities  Response to training: Very receptive   Safety/Room set-up:     Call button accessible?: Yes  Phone accessible?: Yes  Oxygen reconnected?: No, on room air  Patient mobility in room: Able to be up with staff (x 1).  Position on arrival: In wheelchair  Position on departure (bed): Bed alarm on;2 bedrails up  Comments: BAck to bed due to fatigue and OT to follow     GOALS     Patient/Caregiver goals reviewed and integrated with rehab treatment plan:     Multidisciplinary Problems (Active)        Problem: Caregiver Training    Goal Priority Disciplines Outcome   Caregivers to demonstrate safe patient assist, as needed     PT           Problem: DME    Goal Priority Disciplines Outcome   Durable Medical Equipment needs to be met     PT           Problem: Mobility: Ambulation    Goal Priority Disciplines Outcome   Gait (with precautions)     PT    Description:  Pt. to ambulate 150 feet at modified independent level, following sternal precautions, using 4WW.    Stairs (with precautions)     PT    Description:  Pt. to ascend/descend 6 stairs with SBA, using (B) rails, following sternal precautions.            Problem: Mobility: Transfers    Goal Priority Disciplines Outcome   Supine to/from sitting (with precautions)     PT    Description:  Pt. to transfer to/from sitting at modified independent level, following sternal precautions.    Bed to/from chair (with precautions)     PT    Description:  Pt. to transfer bed to/from chair at modified independent level, following sternal precautions.           Problem: Patient/Family Goal    Goal Priority Disciplines Outcome   Home     PT    Description:  Pt. wants to return to previous living situation.           Problem: Precautions    Goal Priority Disciplines Outcome   Application of Precautions     PT    Description:  Pt. to verbally  recall and follow all pertinent precautions, including sternal precautions.                  First session Second session (if applicable)   Start/Stop times: 0830 - 0915 Start/Stop times: 1100 - 1130   Total time: 45 minutes  Total time: 30 minutes      Therapist:  Penelope Coop, PT

## 2016-02-19 NOTE — Progress Notes (Signed)
Nutrition Weekend Screening      Pt does not meet weekend screening criteria. The RD team looks forward to completing a comprehensive assessment. Assessments will be completed on Monday or Tuesday depending on degree of nutrition risk.      [place] Policy:  [id number]; Nutritional Screening and Assessment ([place])

## 2016-02-19 NOTE — Plan of Care (Signed)
Genitourinary - Adult    . Absence of urinary retention Progressing        Musculoskeletal - Adult    . Return mobility to safest level of function Progressing          Pt has slept the majority of the shift. Called to use the bedside commode. Pitting edema noted in lower extremities. LLE slightly larger than right. Negative homans sign. Denies pain.

## 2016-02-19 NOTE — Progress Notes (Signed)
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 admitted on 02/17/2016 with a NSTEMI s/p R&L heart catheterization. PICC line placed 8/25. CRRT from 8/20-8/25. 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     Allergies: Heparin and Sulfa (sulfonamide antibiotics)  PMH:  has a past medical history of History of blood transfusion.   Active problems:   ? Heparin induced thrombocytopenia  ? S/P Aortic valve replacement     OBJECTIVE:     Height:   Admit weight: 102.1 kg (225 lb 1.4 oz) Last charted weight: 102.1 kg (225 lb 1.4 oz) IBW:   kg Body mass index is 36.33 kg/(m^2).    Recent Labs      02/17/16   0433  02/17/16   1136  02/18/16   0734   NA  141  141   --   142   K  3.4*  3.4*  4.0  3.9   CL  111  111   --   111   CO2  22.6  22.6   --   24.3   GLU  111*  111*   --   88   BUN  10  10   --   9   CREATININES  0.79  0.79   --   0.75   CALCIUM  8.5*  8.5*   --   9.1   AST  25   --    --    ALT  20   --    --    ALBUMIN  2.3*  2.3*   --    --      Recent Labs      02/17/16   0433   WHITEBLOODCE  7.5   NEUTROABS  6.1   HGB  8.7*   HCT  26.9*   LABPLAT  240     Warfarin Dosing Record:  Date: 8/31 9/1 9/2 9/3 9/4 9/5 9/6 9/7 9/8 9/9   INR: 2.9 3 4.1 2.8 3.2 4.0 4.2 @ 0517  3.6 @ 1055  3.8 @ 1530 2.8 3.1 2   Warfarin: 3 mg 3 mg 2.5 3 mg 5 mg 5 mg Hold 3mg  Hold      ASSESSMENT:     Argatroban infusion:  Discontinued on 02/16/16  ? S/p surgery, aortic valve replacement, NSTEMI, atrial fibrillation, HIT Ab positive 8/22, Thrombosis in right lower extremity on 8/22  ? Argatroban discontinued on 9/6 @ 0640  ? Warfarin and agratrogan overlapped for 7 days    Recent vitamin K administration: None noted in Epic   Warfarin Indication:  HIT w/ thrombosis in RLE on 8/22; Atrial fibrillation   Goal INR: 2-3 (4-6 while on argatroban, stopped on 9/6)   Today's INR:  Supratherapeutic   Home warfarin dose: N/A   Additional anticoagulant/antiplatelet:  None   Comments about potential drug, disease or dietary interactions? Amiodarone (currently being tapered down), acetaminophen (no use)   Warfarin education status: Completed on 02/16/16       PLAN:     Warfarin:  ? Restart warfarin at 3 mg daily  ? Daily INR/PT ordered  ? Pharmacy to monitor and adjust dosing as needed    Signed:  Luna Fuse, [facility]

## 2016-02-20 LAB — PROTIME-INR
INR: 1.2 (ref 0.9–1.2)
Protime: 13.5 Seconds — ABNORMAL HIGH (ref 10.0–13.0)

## 2016-02-20 MED ORDER — fondaparinux (ARIXTRA) syringe 10 mg
10 | Freq: Every day | SUBCUTANEOUS | Status: DC
Start: 2016-02-20 — End: 2016-02-24
  Administered 2016-02-20 – 2016-02-24 (×5): 10 mg via SUBCUTANEOUS

## 2016-02-20 MED FILL — COUMADIN 2 MG TABLET: 2 mg | ORAL | Qty: 1

## 2016-02-20 MED FILL — FAMOTIDINE 20 MG TABLET: 20 mg | ORAL | Qty: 1

## 2016-02-20 MED FILL — FONDAPARINUX 10 MG/0.8 ML SUBCUTANEOUS SOLUTION SYRINGE: 10 | SUBCUTANEOUS | Qty: 0.8

## 2016-02-20 MED FILL — ASPIRIN 81 MG TABLET,ENTERIC COATED: 81 mg | ORAL | Qty: 1

## 2016-02-20 MED FILL — AMIODARONE 200 MG TABLET: 200 mg | ORAL | Qty: 2

## 2016-02-20 MED FILL — CARVEDILOL 3.125 MG TABLET: 3.125 mg | ORAL | Qty: 2

## 2016-02-20 MED FILL — FUROSEMIDE 40 MG TABLET: 40 mg | ORAL | Qty: 1

## 2016-02-20 MED FILL — ATORVASTATIN 20 MG TABLET: 20 mg | ORAL | Qty: 1

## 2016-02-20 MED FILL — KLOR-CON M10 MEQ TABLET,EXTENDED RELEASE: 10 meq | ORAL | Qty: 2

## 2016-02-20 MED FILL — COUMADIN 1 MG TABLET: 1 mg | ORAL | Qty: 1

## 2016-02-20 NOTE — Progress Notes (Signed)
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 admitted on 02/17/2016 with a NSTEMI s/p R&L heart catheterization. PICC line placed 8/25. CRRT from 8/20-8/25. 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     Allergies: Heparin and Sulfa (sulfonamide antibiotics)  PMH:  has a past medical history of History of blood transfusion.   Active problems:   ? Heparin induced thrombocytopenia  ? S/P Aortic valve replacement     OBJECTIVE:     Height:   Admit weight: 102.1 kg (225 lb 1.4 oz) Last charted weight: 102.1 kg (225 lb 1.4 oz) IBW:   kg Body mass index is 36.33 kg/(m^2).    Recent Labs      02/17/16   1136  02/18/16   0734   NA   --   142   K  4.0  3.9   CL   --   111   CO2   --   24.3   GLU   --   88   BUN   --   9   CREATININES   --   0.75   CALCIUM   --   9.1     No results for input(s): WHITEBLOODCE, NEUTROABS, BANDSABS, SEGSABS, HGB, HCT, LABPLAT in the last 72 hours.  Warfarin Dosing Record:  Date: 8/31 9/1 9/2 9/3 9/4 9/5 9/6 9/7 9/8 9/9 9/10   INR: 2.9 3 4.1 2.8 3.2 4.0 4.2 @ 0517  3.6 @ 1055  3.8 @ 1530 2.8 3.1 2 1.2   Warfarin: 3 mg 3 mg 2.5 3 mg 5 mg 5 mg Hold 3mg  Hold 3 mg 3 mg     ASSESSMENT:     Recent vitamin K administration: None noted in Epic   Warfarin Indication:  HIT w/ thrombosis in RLE on 8/22; Atrial fibrillation   Goal INR: 2-3 (4-6 while on argatroban, stopped on 9/6)   Today's INR: Supratherapeutic   Home warfarin dose: N/A   Additional anticoagulant/antiplatelet:  Fondaparinux   Comments about potential drug, disease or dietary interactions? Amiodarone (currently being tapered down), acetaminophen (no use)   Warfarin education status: Completed on 02/16/16     PLAN:     Warfarin:  ? Continue warfarin at 3 mg daily for today. I expect this drastic decrease in INR is from recent holding of warfarin on both 9/6 and 9/8. May need  to increase dose further, but I would like at least 2 days on the same dose to assess pt's actual dosing requirements accurately.   ? Since pt is at risk for thrombosis with recent HIT diagnosis, MD agreed to add on fondaparinux 10 mg SQ daily (wt >100 kg) until INR >2 for 48 h. No renal adjustments necessary at this time.   ? Daily INR/PT ordered  ? Pharmacy to monitor and adjust dosing as needed    Signed:  Luna Fuse, [facility]

## 2016-02-21 LAB — BASIC METABOLIC PANEL
Anion Gap: 8.4 mmol/L (ref 3.0–11.0)
BUN: 8 mg/dL (ref 6–23)
CO2 - Carbon Dioxide: 23.6 mmol/L (ref 21.0–31.0)
Calcium: 9.3 mg/dL (ref 8.6–10.3)
Chloride: 110 mmol/L (ref 98–111)
Creatinine: 0.8 mg/dL (ref 0.55–1.10)
GFR Estimate: >60 mL/min/{1.73_m2} (ref >=60)
Glucose: 93 mg/dL (ref 80–99)
Potassium: 3.7 mmol/L (ref 3.5–5.1)
Sodium: 142 mmol/L (ref 135–143)

## 2016-02-21 LAB — CBC WITH AUTO DIFFERENTIAL
Basophils %: 2 % (ref 0–2)
Basophils, Absolute: 0.1 10*3/ÂµL (ref 0.0–0.2)
Eosinophils %: 4 % (ref 0–7)
Eosinophils, Absolute: 0.2 10*3/ÂµL (ref 0.0–0.7)
HCT: 29.9 % — ABNORMAL LOW (ref 37.0–48.0)
Hemoglobin: 9.4 g/dL — ABNORMAL LOW (ref 12.0–16.0)
Lymphocytes %: 25 % (ref 25–45)
Lymphocytes, Absolute: 1.3 10*3/ÂµL (ref 1.1–4.3)
MCH: 25.9 pg — ABNORMAL LOW (ref 27.0–34.0)
MCHC: 31.6 g/dL — ABNORMAL LOW (ref 32.0–36.0)
MCV: 82 fL (ref 81.0–99.0)
MPV: 7.4 fL (ref 7.4–10.4)
Monocytes %: 8 % (ref 0–12)
Monocytes, Absolute: 0.4 10*3/ÂµL (ref 0.0–1.2)
Neutrophils %: 61 % (ref 35–70)
Neutrophils, Absolute: 3.3 10*3/ÂµL (ref 1.6–7.3)
Platelet Count: 225 10*3/ÂµL (ref 150–400)
RBC: 3.64 10*6/ÂµL — ABNORMAL LOW (ref 4.20–5.40)
RDW: 29.8 % — ABNORMAL HIGH (ref 11.5–14.5)
WBC: 5.4 10*3/ÂµL (ref 4.8–10.8)

## 2016-02-21 LAB — PROTIME-INR
INR: 1.8 — ABNORMAL HIGH (ref 0.9–1.2)
Protime: 19.5 Seconds — ABNORMAL HIGH (ref 10.0–13.0)

## 2016-02-21 MED FILL — ATORVASTATIN 20 MG TABLET: 20 mg | ORAL | Qty: 1

## 2016-02-21 MED FILL — FONDAPARINUX 10 MG/0.8 ML SUBCUTANEOUS SOLUTION SYRINGE: 10 | SUBCUTANEOUS | Qty: 0.8

## 2016-02-21 MED FILL — AMIODARONE 200 MG TABLET: 200 mg | ORAL | Qty: 2

## 2016-02-21 MED FILL — KLOR-CON M10 MEQ TABLET,EXTENDED RELEASE: 10 meq | ORAL | Qty: 2

## 2016-02-21 MED FILL — ASPIRIN 81 MG TABLET,ENTERIC COATED: 81 mg | ORAL | Qty: 1

## 2016-02-21 MED FILL — CARVEDILOL 3.125 MG TABLET: 3.125 mg | ORAL | Qty: 2

## 2016-02-21 MED FILL — FAMOTIDINE 20 MG TABLET: 20 mg | ORAL | Qty: 1

## 2016-02-21 MED FILL — FUROSEMIDE 40 MG TABLET: 40 mg | ORAL | Qty: 1

## 2016-02-21 NOTE — Progress Notes (Signed)
02/21/16 Pt not receiving IV meds or infusions. Recommend removal of PICC(inserted 8/24). Consulted with Airline pilot. She will contact Dr Fredrich Birks. kf

## 2016-02-21 NOTE — Progress Notes (Signed)
PMR Physician Daily Note     General and Subjective:   Brandy [name] is doing quite well today.  Denies any shortness of breath or chest pain.  Participating very well in all therapy sessions      Observations and Exam:  Vital Sign Ranges for Last 24 Hours:  BP  Min: 140/73  Max: 147/75  Temp  Min: 36.5 ?C (97.7 ?F)  Max: 36.7 ?C (98.1 ?F)  Pulse  Min: 79  Max: 82  Resp  Min: 18  Max: 24  SpO2  Min: 96 %  Max: 96 %    Alert, oriented  No distress  Sternal incision is clean, intact  Heart is RRR  Lungs clear  Legs:  Still with bilateral peripheral leg swelling but less so  Fingers and toes examined, the toes and finger with eschar are all stable.  Some improvement in right foot sensation. No signs of infection.           Recent Glucose Monitoring  Lab Results   Component Value Date    POCGLU 111 (H) 02/15/2016    POCGLU 118 (H) 02/10/2016    POCGLU 91 02/10/2016    POCGLU 162 (H) 02/10/2016    POCGLU 102 (H) 02/10/2016           Intake/Output Summary (Last 24 hours) at 02/21/16 1324  Last data filed at 02/21/16 1300   Gross per 24 hour   Intake              886 ml   Output             1100 ml   Net             -214 ml       Recent Labs  Results for orders placed or performed during the hospital encounter of 02/17/16 (from the past 24 hour(s))   Protime Panel -Now & Q AM    Collection Time: 02/21/16  6:51 AM   Result Value Ref Range    Protime 19.5 (H) 10.0 - 13.0 Seconds    INR 1.8 (H) 0.9 - 1.2   CBC with Auto Differential -Every Monday & Thursday    Collection Time: 02/21/16  6:51 AM   Result Value Ref Range    WBC 5.4 4.8 - 10.8 10*3/?L    RBC 3.64 (L) 4.20 - 5.40 10*6/?L    Hemoglobin 9.4 (L) 12.0 - 16.0 g/dL    HCT 16.1 (L) 09.6 - 48.0 %    MCV 82.0 81.0 - 99.0 fL    MCH 25.9 (L) 27.0 - 34.0 pg    MCHC 31.6 (L) 32.0 - 36.0 g/dL    RDW 04.5 (H) 40.9 - 14.5 %    Platelet Count 225 150 - 400 10*3/?L    MPV 7.4 7.4 - 10.4 fL    Neutrophils % 61 35 - 70 %    Lymphocytes % 25 25 - 45 %    Monocytes % 8 0 - 12 %    Eosinophils % 4 0 - 7 %    Basophils % 2 0 - 2 %    Neutrophils, Absolute 3.3 1.6 - 7.3 10*3/?L    Lymphocytes, Absolute 1.3 1.1 - 4.3 10*3/?L    Monocytes, Absolute 0.4 0.0 - 1.2 10*3/?L    Eosinophils, Absolute 0.2 0.0 - 0.7 10*3/?L    Basophils, Absolute 0.1 0.0 - 0.2 10*3/?L    Differential Type Automated Differential    Basic Metabolic Panel -Every  Monday & Thursday    Collection Time: 02/21/16  6:51 AM   Result Value Ref Range    Sodium 142 135 - 143 mmol/L    Potassium 3.7 3.5 - 5.1 mmol/L    Chloride 110 98 - 111 mmol/L    CO2 - Carbon Dioxide 23.6 21.0 - 31.0 mmol/L    Glucose 93 80 - 99 mg/dL    BUN 8 6 - 23 mg/dL    Creatinine 1.61 0.96 - 1.10 mg/dL    Calcium 9.3 8.6 - 04.5 mg/dL    Anion Gap 8.4 3.0 - 11.0 mmol/L    GFR Estimate >60 >=60 mL/min/1.61m*2    GFR Additional Info         [x]     I have reviewed the Rehabilitation  Interdisciplinary Notes.   Medical/Function Problems:   Patient Active Problem List    Diagnosis SNOMED CT(R) Date Noted   . Aortic valve disorder AORTIC VALVE DISORDER 02/17/2016   . Impaired gait and mobility ABNORMAL GAIT 02/17/2016   . Impaired activities of daily living ACTIVITY OF DAILY LIVING (ADL) ALTERATION 02/17/2016   . Peripheral edema PERIPHERAL EDEMA 02/17/2016   . Ischemia of digits of hands and feet UPPER LIMB ISCHEMIA 02/17/2016   . Blood loss anemia ANEMIA DUE TO BLOOD LOSS 02/17/2016   . Hypokalemia HYPOKALEMIA 02/17/2016   . Acute renal failure with tubular necrosis (CMS/HCC) ACUTE RENAL FAILURE SYNDROME 02/13/2016   . Heparin induced thrombocytopenia (CMS/HCC) HEPARIN-INDUCED THROMBOCYTOPENIA 02/02/2016   . S/P AVR (aortic valve replacement) HISTORY OF AORTIC VALVE REPLACEMENT 01/27/2016   . Non-ischemic cardiomyopathy (CMS/HCC) CARDIOMYOPATHY 01/23/2016   . Respiratory failure (CMS/HCC) RESPIRATORY FAILURE 01/23/2016   . Aortic stenosis, severe AORTIC VALVE STENOSIS 01/22/2016   . Acute systolic heart failure (CMS/HCC) ACUTE SYSTOLIC HEART FAILURE 01/22/2016      . Heart failure (CMS/HCC) HEART FAILURE 01/21/2016   . Elevated brain natriuretic peptide (BNP) level HORMONE LEVEL - FINDING 01/21/2016   . NSTEMI (non-ST elevated myocardial infarction) (CMS/HCC) ACUTE NON-ST SEGMENT ELEVATION MYOCARDIAL INFARCTION 01/21/2016   . Demand ischemia of myocardium (CMS/HCC) ACUTE ISCHEMIC HEART DISEASE 01/21/2016   . Tachycardia TACHYCARDIA 01/21/2016   . Pleural effusion PLEURAL EFFUSION 01/21/2016   . Obesity OBESITY 01/21/2016   . Acute respiratory failure with hypoxia (CMS/HCC) ACUTE RESPIRATORY FAILURE 01/21/2016   . Hypotension LOW BLOOD PRESSURE 01/21/2016   . Iron deficiency anemia IRON DEFICIENCY ANEMIA 01/21/2016   . Thrombocytosis (CMS/HCC) THROMBOCYTOSIS 01/21/2016   . Weight loss WEIGHT DECREASED 01/21/2016   . Family history of cancer FAMILY HISTORY OF CANCER 01/21/2016   . Cardiomegaly CARDIOMEGALY 01/21/2016   . Mediastinal lymphadenopathy MEDIASTINAL LYMPHADENOPATHY 01/21/2016   . Hilar lymphadenopathy- bilataeral  HILAR LYMPHADENOPATHY 01/21/2016   . Chest pain CHEST PAIN 01/21/2016   . Dyspnea and respiratory abnormality DYSPNEA 01/21/2016   . Abnormal EKG ELECTROCARDIOGRAM ABNORMAL 01/21/2016   . Opacity of lung on imaging study ABNORMAL FINDINGS ON DIAGNOSTIC IMAGING OF LUNG 01/21/2016   . Liver lesion- small hypodense lesion around falciform lig LESION OF LIVER 01/21/2016       Impression and Plan:    60 year-old woman who has had bioprosthetic aortic valve replacement. ?Her hospitalization characterized by multiple severe issues including HIT, atrial fib, ARF, CHF, Non-STEMI, severe blood-loss anemia (thought to be from GI source), dysphagia, persistent pulmonary insufficiency, and general weakness and deconditioning. ?Continues with marked edema in both lower limbs, though improving. ?Has multiple areas of ischemic changes in fingers and toes, overall of moderate  severity, no evidence of infection at this time. ?  . Peripheral edema: improving.  Continues with  lasix, overall resolving.  Discussed importance of adequate nutrition and protein as well.    . Ischemia of digits of hands and feet: ?Continues to demarcated, WOCN following, will monitor. ?This is due to pressors and shock.  Looks somewhat better today   . Blood loss anemia: improving.    . Hypokalemia: ?resolved but needs monitoring.    . Heparin induced thrombocytopenia: ?Continue coumadin, currently in sub-therapeutic range, on bridge therapy with arixtra   . S/P AVR (aortic valve replacement): ?As above. Maintain and reinforce sternotomy precautions   . Non-ischemic cardiomyopathy: ?Continue with coreg. ?She's also on amiodarone, will wean as directed by CT surgery service.            [x]     Patient remains appropriate for inpatient rehabilitation facility level of care, continue present therapies and medical treatment and monitoring.    []     See orders for any treatment modifications     Hansel Feinstein  02/21/2016 1:24 PM

## 2016-02-21 NOTE — Other (Signed)
[facility] Rehab Team Conference Note for 02/22/2016    Patient name: Brandy [name]    Date of Birth: 11-28-1955 Date: 02/22/2016   Gender: female  MRN: [id number]   Room/Bed: 2303/2303-01 Time: 11:05 AM   Payor Info: Payor: MEDICAID / Plan: MEDICAID PLUS / Product Type: *No Product type* /  Admit Date/Time:  02/17/2016 12:08 PM     Admitting Diagnosis:   Patient Active Problem List   Diagnosis SNOMED CT(R)   . Heart failure (CMS/HCC) HEART FAILURE   . Elevated brain natriuretic peptide (BNP) level HORMONE LEVEL - FINDING   . NSTEMI (non-ST elevated myocardial infarction) (CMS/HCC) ACUTE NON-ST SEGMENT ELEVATION MYOCARDIAL INFARCTION   . Demand ischemia of myocardium (CMS/HCC) ACUTE ISCHEMIC HEART DISEASE   . Tachycardia TACHYCARDIA   . Pleural effusion PLEURAL EFFUSION   . Obesity OBESITY   . Acute respiratory failure with hypoxia (CMS/HCC) ACUTE RESPIRATORY FAILURE   . Hypotension LOW BLOOD PRESSURE   . Iron deficiency anemia IRON DEFICIENCY ANEMIA   . Thrombocytosis (CMS/HCC) THROMBOCYTOSIS   . Weight loss WEIGHT DECREASED   . Family history of cancer FAMILY HISTORY OF CANCER   . Cardiomegaly CARDIOMEGALY   . Mediastinal lymphadenopathy MEDIASTINAL LYMPHADENOPATHY   . Hilar lymphadenopathy- bilataeral  HILAR LYMPHADENOPATHY   . Chest pain CHEST PAIN   . Dyspnea and respiratory abnormality DYSPNEA   . Abnormal EKG ELECTROCARDIOGRAM ABNORMAL   . Opacity of lung on imaging study ABNORMAL FINDINGS ON DIAGNOSTIC IMAGING OF LUNG   . Liver lesion- small hypodense lesion around falciform lig LESION OF LIVER   . Aortic stenosis, severe AORTIC VALVE STENOSIS   . Acute systolic heart failure (CMS/HCC) ACUTE SYSTOLIC HEART FAILURE   . Non-ischemic cardiomyopathy (CMS/HCC) CARDIOMYOPATHY   . Respiratory failure (CMS/HCC) RESPIRATORY FAILURE   . S/P AVR (aortic valve replacement) HISTORY OF AORTIC VALVE REPLACEMENT   . 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 Acute renal failure with tubular  necrosis (CMS/HCC) ACUTE RENAL FAILURE SYNDROME   . Aortic valve disorder AORTIC VALVE DISORDER   . Impaired gait and mobility ABNORMAL GAIT   . Impaired activities of daily living ACTIVITY OF DAILY LIVING (ADL) ALTERATION   . Peripheral edema PERIPHERAL EDEMA   . 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 Blood loss anemia ANEMIA DUE TO BLOOD LOSS   . Hypokalemia HYPOKALEMIA     Impairment Group: Cardiac    Team Members Present:  Physician Team Member: Dr. Thereasa Solo  Neuropsychologist: Dr. Thurmon Fair  Nurse: Ardis Hughs, RN  Case Manager: Rigoberto Noel, RN  Physical Therapist: Kavin Leech, PT  Occupational Therapist: Hulen Luster, OT  Speech Language Pathologist: Lorina Rabon, SLP  Dietician: Laqueta Due, RD    Goal D/C Goal Current Status Still Valid   Mobility Ambulate 150 feet at modified independent level, following sternal precautions, using 4WW.   Acend/descend 6 stairs with SBA, using (B) rails, following sternal precautions.   Transfer to/from sitting bed and to/from chairat modified independent level, following sternal precautions.    SBA for sup <> sit and sit <> stand, needs occasional VC's for sternal precautions. CGA stand pivot transfers.  Amb 180 ft SBA with 4WW  Ascend/descends steps x 5 CGA- min A with BHR Yes   ADL's - Grooming mod I  - Dressing mod I- UB and LB  - Bathing UB mod I, LB SBA  - Toileting and toileting transfer mod A  - Shower transfer- SBA -  Grooming SBA with use of 4WW, standing and sitting  - Dressing- UB SBA, LB SBA  - Bathing mod A  - Toileting and toileting transfer- SBA via 1YN  - Shower transfer- mod A Yes   Bowel and Bladder Continent of Bowel and Bladder Continent of bowel and bladder Yes   Caregiver Training Caregiver provides safe patient assist None to date, therapists to contact spouse re: setting up training schedule Yes   Skin Integrity Wounds healed; Performs adequate pressure relief techniques; Independent in wound management  Sternal  Right upper extremity: index finger,  ischemic eschar, treating with iodine improving. Forearm, ischemia r/t IV infiltrate, treating with iodine  Right lower extremity, toes, ischemic eschar, treating with iodine, improving  Left lower extremity, toes, ischemic eschar, treating with iodine, improving  All care coordinated with WOCN Yes   Medical Status  Peripheral edema: improving.  Continues with lasix, overall resolving.  Discussed importance of adequate nutrition and protein as well. Wearing compressive stockings during the day.   Ischemia of digits of hands and feet: ?Continues to demarcated, WOCN following, will monitor. ?This is due to pressors and shock.  Looks somewhat better today   Blood loss anemia: improving.    Hypokalemia: ?resolved but needs monitoring.    Heparin induced thrombocytopenia: ?Continue coumadin, currently in sub-therapeutic range, on bridge therapy with arixtra    Yes     Barriers to Goal Achievement and Action Plan:   None       Plan of Care changes required:  No  If Yes:                                      Team Discharge Planning:  Anticipated discharge date: 02/26/2016  Caregiver:  Spouse  Anticipated Disposition:  Manufactured home with 5 steps with bilateral rails  Anticipated Follow-up Services: Home Health RN, PT, OT, bath aide, Dr Debroah Baller, PMR, [facility] staff will find a [place] APP PCP for [facility].  Anticipated Equipment Needs:  TBD    Plan of Care reviewed/changes made reflecting current Progress Toward Goals as appropriate.    I (Dr. Lynnea Ferrier) led the interdisciplinary rehabilitation team meeting and personally examined the patient today, reviewed all pertinent labs, vitals, nursing, and therapy data and reports.  I am in agreement with above-noted reports and plans of action.

## 2016-02-21 NOTE — Treatment Summary (Signed)
[facility] PT TREATMENT NOTE    [name] / 2303/2303-01 DOB:  06-Jul-1955 / 60 y.o. Date:  02/21/2016     Precautions  Specific mobility precautions: Sternal precautions  Sternal precautions: Standard  Required Braces/Orthotics: None  Other: Very sore (R) 3rd toe; Ejection fraction estimated to be 30-35%     ASSESSMENT     Summary:  Progressing well with activity tolerance, gait and balance activities. SBA amb from her room to gym with 4ww. Short gait trials (in // bars for safety) for fwd and backwards gait with no AD, additional sidestepping activities for balance (CGA); use of standing on balance pad, reaching for objects; also tapping up onto 2 inch step from softer surface. Tolerates therapy well, very eager and motivated to participate. Needs encouragement to take breaks, pace activities and not overextend.        Activity tolerance: Tolerates 30 min activity with multiple rests     Precaution Awareness: Fully aware of precaution(s)  Deficit Awareness: Fully aware of deficits  Correction of Errors: Self-corrects with cues  Safety Judgment: Good awareness of safety     DME Needs:   DME Needs: 1OX     PLAN     Intensity/Frequency:  Duration:   Intensity/frequency: 90 minutes daily, 5x/week  14 days      Continue intensive level of Physical therapy to address progressive mobility training, caregiver training and DME needs.          Treatment Diagnosis Surgery (if applicable)   Primary Dx: Acute cardio-pulmonary failure; severe aortic stenosis which required aortic valve replacement  Treatment Dx: Impaired mobility  Procedure/Date: 01/27/2016: Aortic valve replacement       SUBJECTIVE   I want to do well     Pain Level  Current status: Pain does not limit pt's ability to participate in therapy  Pain at start of session: 0 - No Pain  Pain at end of session: 3  Pain location: Hip (R)  Pain descriptors: Aching     OBJECTIVE   Multiple gait trials with 4ww; additionally in // bars no UE support for fwd and  backwards gait.   Step negotiation leading with RLE with step to pattern then leading with LLE use of BLE SBA/CGA.   Biodex x 12 min.      Mental status:   General Demeanor: Pleasant, Cooperative  LOC: Alert  Orientation: Oriented x 4  Oriented to: Person, Place, Situation  Directions: Follows multi-step commands  --> Follows multi-step commands: Consistently  --> Follows one-step commands: Consistently  Attention span: Appears intact  Memory: Appears intact in social/therapy situations   Medical appliances:   Medical Appliances  Medical Appliances: PICC line         TREATMENT    Bed Mobility/Transfers  Bed mobility       Rolling / logroll  Roll L: SBA      Transition to sitting up Sidelying to Sit: SBA   To resting position      Transition to sitting down SBA;Verbal cues     Transition to supine SBA     Scooting     Transfers      Transition to standing From bed: SBA;Verbal cues  From wheelchair: SBA;Verbal cues     Method Stand pivot: CGA          Gait /Stair training:   Mobility - Gait (Level)  Gait (distance): 186 feet (2 more trials ~150)  Gait (assist level): SBA  Assistive device:  Four wheeled walker  Endurance: improving  Pace: moderate  Pattern: Downward gaze;Wide BOS;Decreased step length L;Decreased step length R  Surface: Level tile  Mobility - Gait (Stairs/curbs)  Number of Stairs: 5 x4  Step height: 6  Pattern: Step to  Assist level: Min assist  Assistive device: Hand railing bilaterally    Wheelchair mobility:  Propulsion method: Manual  Manual: Bilateral LE's  Propulsion distance: 150+  Assist level: SBA  Surface: Level interior   Ther. Ex.:     Biodex recumbent elliptical machine x 12 minutes, level 1 resistance, flat profile, bilateral UE/LE for interlimb coordination and conditioning.   Balance training:   Balance - Basic  Static sitting balance: Assist level;Position  Assist level: Distant Supervision  Position: Feet - supported  Static standing balance: Assist level  Assist level:  SBA;CGA  Dynamic standing balance: Assist level  Assist level with weight shifts: CGA  Balance - Specific  Gait balance activities: Walking backwards;Sidestepping  Independence Level: CGA     Training/Education:  Trainee(s): Primary Learner  Primary Learner: Patient  Primary Learner - Barriers to Learning?: No  Training provided: Safety; HEP  Response to training: receptive   Safety/Room set-up:     Call button accessible?: Yes  Phone accessible?: Yes  Oxygen reconnected?: No, on room air  Patient mobility in room: Able to be up with staff (x 1).  Position on arrival: In bed  Position on departure (bed): Bed alarm on;2 bedrails up  Comments: Back to bed at end of therapy     GOALS     Patient/Caregiver goals reviewed and integrated with rehab treatment plan:     Multidisciplinary Problems (Active)        Problem: Caregiver Training    Goal Priority Disciplines Outcome   Caregivers to demonstrate safe patient assist, as needed     PT           Problem: DME    Goal Priority Disciplines Outcome   Durable Medical Equipment needs to be met     PT           Problem: Mobility: Ambulation    Goal Priority Disciplines Outcome   Gait (with precautions)     PT    Description:  Pt. to ambulate 150 feet at modified independent level, following sternal precautions, using 4WW.    Stairs (with precautions)     PT    Description:  Pt. to ascend/descend 6 stairs with SBA, using (B) rails, following sternal precautions.            Problem: Mobility: Transfers    Goal Priority Disciplines Outcome   Supine to/from sitting (with precautions)     PT    Description:  Pt. to transfer to/from sitting at modified independent level, following sternal precautions.    Bed to/from chair (with precautions)     PT    Description:  Pt. to transfer bed to/from chair at modified independent level, following sternal precautions.           Problem: Patient/Family Goal    Goal Priority Disciplines Outcome   Home     PT    Description:  Pt. wants to  return to previous living situation.           Problem: Precautions    Goal Priority Disciplines Outcome   Application of Precautions     PT    Description:  Pt. to verbally recall and follow all pertinent precautions, including sternal precautions.  First session Second session (if applicable)   Start/Stop times: 0830 - 0915 Start/Stop times: 1100 - 1145   Total time: 45 minutes  Total time: 45 minutes      Therapist:  Penelope Coop, PT

## 2016-02-21 NOTE — Progress Notes (Signed)
Nutrition Follow-Up    Brandy [name] is a 60 y.o. female, DOB 1956-05-15    Subjective:  Recent intake values reflect adequate oral intake of: 75-100% meals. This is much improved from acute floor.     Admit Anthropometric Measurements:   Ht:          Admit Wt: 101.4 kg (224 lbs)  IBW: 59.09 kg (130 lbs)   %IBW: 172%  BMI: 36.1 (no edema)   Ht/wt status:obese class 2     Today's Weight:  102.1 kg (225 lb 1.4 oz) Body mass index is 36.33 kg/(m^2).              Nutrition-Focused Physical Findings:  Patient appears well-nourished and well-hydrated     Poor dentition per physician note   Nutrition risk factors: Current morbidity   Risk for nutritional deficit:moderate     Food and Nutritional Intake:  Diet Orders:   Procedures   . Diet Regular; No - patient to receive automatic trays     PO Intake is adequate    Other Allergies: Heparin and Sulfa (sulfonamide antibiotics)     Client History:  Admission Diagnosis: weakness    Diagnosis:   No diagnosis found.  Usual meds include: Reviewed   Current meds include: Pepcid, lasix, insulin, zofran, potassium chloride    Biochemical Data, Medical Tests and Procedures:  Lab Results   Component Value Date    NA 142 02/21/2016    K 3.7 02/21/2016    CL 110 02/21/2016    CO2 23.6 02/21/2016    GLU 93 02/21/2016    BUN 8 02/21/2016    CREATININES 0.80 02/21/2016    LABCREA 77 01/28/2016    MG 1.8 02/10/2016    PHOS 1.8 (L) 02/17/2016   Phos is BNL - not replaced or rechecked    Nutrition Diagnosis (Problem, Etiology, Signs and Symptoms)  Inadequate protein-energy intake related to poor appetite as evidenced by PO intake of 0-50% of meals and pt report of poor appetite.   (Progress:  Progressing.)    Nutrition Intervention:   - Continue current diet per SLP; will follow for PO adequacy.  - Will discontinue ONS as pt is eating well    Monitoring and Evaluation:   Will continue to follow and monitor PO intake and labs per nutrition protocol.  PO goal: >70% of meals/supplements.          Follow Up: 4-8 days    Laqueta Due  02/21/2016  3:13 PM   Inpatient Nutrition

## 2016-02-21 NOTE — Treatment Summary (Addendum)
[facility] OT TREATMENT NOTE    [name] / 2303/2303-01 DOB:  01/24/1956 / 60 y.o. Date:  02/21/2016     Precautions  Specific mobility precautions: Sternal precautions  Sternal Precautions: Standard     ASSESSMENT     Summary:  Overall improvement noted by OT and pt in pt's capacity to perform self care tasks with increased endurance, and independence especially with getting self in/out of bed.  Pt instructed in and used energy conservation and pacing strategies throughout session, as well as use of sternal precautions.  - Simulated home setting with use of flat bed, and self care tasks in room.  Supine <> sit SBA without use of bed rail after training (initially close SBA with mild difficulty, and pt attempting to use bedrail).  B/s <> toilet via 4WW SBA.  Toileting SBA.  Grooming while standing at sink SBA.  Pt required sitting rest break after 3 min's of standing.  - Grip strength:  R 25#... L 38#.  Pt is R dominant.  - Pt lives in a mobile home-- pt performed household distances, kitchen maneuvers and light kitchen tasks with use of 4WW.  Pt instructed in use of 4WW in order to assist with household tasks such as carrying personal items, laundry, etc.  Pt also instructed in positioning and use of 4WW for energy conservation during ADL's.  - Pt also performed functional activities while standing with 4WW-- Pt was able to tolerate 3 min's x 2 with sitting break-- OT goal of 10 min's in order to complete ADL's.  - Fair overall endurance, excellent participation.     Activity Tolerance:    Activity tolerance: Tolerates 30 min activity with multiple rests  Comments: Pt requires use of energy conservation and pacing strategies throughout session.   Safety Awareness:     Precaution Awareness: Decreased awareness of precaution(s) (Occasional sternal precautions reminders)  Safety Judgment: Good awareness of safety     PLAN     Treatment Plan Frequency     Dlisa will benefit from Treatment Plan: Continue per  established POC.  Pt. demonstrates limitations which require skilled OT intervention.;self-care instruction;safety training;instruction/review of precautions;ADL training;therapeutic activities;functional mobility training;energy conservation training;patient/caregiver training;DME needs assessment. Intensity/Frequency: 90 minutes daily, 5x/week     [name] will remain on the Occupational Therapy schedule until goals are met, or there has been a change in the Plan of Care.          Treatment Diagnosis Surgery (if applicable)    Primary Dx: Respriatory failure, s/p AVR       SUBJECTIVE   Pt agreeable to OT.     Pain Level  Current status: Pain does not limit pt's ability to participate in therapy  Pain at start of session: 0 - No Pain  Pain at end of session: 0 - No Pain     OBJECTIVE    ADL training with focus on self care skills, and TA's with focus on standing tolerance, and pre-cooking tasks and kitchen maneuvers with 4WW-- all tasks performed while observing sternal precautions     Mental status  General Demeanor: Pleasant;Cooperative  LOC: Alert  Orientation: Oriented x 4  Directions: Follows one-step commands  --> Follows one-step commands: Consistently  Attention span: Appears intact  Memory: Appears intact in social/therapy situations  Safety judgment: Good awareness of safety precautions   Medical appliances   Medical Appliances: PICC line (Physician notified and addressed PICC)  Observation     in bed.   Other         TREATMENT  Transfers/Bed Mobility  Transition  Assist Level/Comments   Rolling Roll L: Modified Independent    --> Sitting up Sidelying to Sit: SBA   --> Standing From bed: SBA;Verbal cues  From commode: SBA;Verbal cues  From toilet: SBA;Verbal cues  From wheelchair: SBA;Verbal cues   Pivot Transfer Stand pivot: SBA (via 4WW)   --> Sitting down     --> Supine SBA;Verbal cues   Scooting up in bed     ADLs/Transfers     ADL    Assist Level/Comments   Eating (Meal prep, self feeding)     Grooming SBA (while standing at sink)   Upper Body Dressing     Lower Body Dressing     Bathing     Toileting SBA (with use of toilet tongs)   Toilet Transfer SBA;Verbal cues   Tub or Shower Transfer     Where assessed: Standing at the sink;Sitting at the sink;Edge of bed;Wheelchair;Supine    Training/Education  Trainee(s): Producer, television/film/video Learner: Patient  Engineer, production - Barriers to Learning?: No  Training provided: ADL training with focus on self care skills, and TA's with focus on standing tolerance, and pre-cooking tasks and kitchen maneuvers with 4WW-- all tasks performed while observing sternal precautions  Response to training: Pt eager and receptive.  Pt able to demo understanding of all instructions via return demo and verbalizations   Safety/Room set-up   Call button accessible?: Yes  Phone accessible?: Yes  Position on arrival: In bed  Position on departure (bed): Bed alarm on;Standard bed;3 bedrails up;Bed in lowest position     GOALS     Patient/Caregiver goals reviewed and integrated with rehab treatment plan:     Multidisciplinary Problems (Active)        Problem: ADLs    Goal Priority Disciplines Outcome   ADL Activity Tolerance for Self Care     OT    Description:  Pt  to tolerate 10  minutes of self care activity in standing position    ADL Grooming     OT    Description:  Patient to be able to perform grooming with mod I assist.    ADL Toilet Transfers     OT    Description:  Patient to be able to perform toilet transfers with modified independence..     ADL Tub Transfers     OT    Description:  Patient to be able to perform tub/shower transfers with SBA.    ADL Upper Body Bathing     OT    Description:  Patient to be able to perform upper body bathing with mod I assist.    ADL Lower Body Bathing     OT    Description:  Patient to be able to perform lower body bathing with stand by assist.      ADL Upper Body Dressing      OT    Description:  Patient to be able to perform upper body dressing with mod I assist.    ADL Lower Body Dressing     OT    Description:  Patient to be able to perform lower body dressing with modified independence. .     ADL Toileting     OT    Description:  Patient to be able to perform toilet hygiene and clothes management with modified independence.  Caregiver     OT    Description:  Caregiver will demonstrate the ability to assist with patient's care, as needed.           Problem: IP Post Cardiac Goal List    Goal Priority Disciplines Outcome   Precautions     OT    Description:  Patient to follow sternal  precautions, while performing ADLs.           Problem: Safety    Goal Priority Disciplines Outcome   Safety DME     OT    Description:  Pt/Caregiver to demonstrate awareness of DME needs for safety upon discharge.                  First session Second session (if applicable)    Start/Stop times: 0930 - 1100 Start/Stop times:   - Stop Time:     Total time: 90 minutes  Total time:   minutes     Therapist:  Matt Holmes, OT

## 2016-02-21 NOTE — Progress Notes (Signed)
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 admitted on 02/17/2016 with a NSTEMI s/p R&L heart catheterization. PICC line placed 8/25. CRRT from 8/20-8/25. 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     Allergies: Heparin and Sulfa (sulfonamide antibiotics)  PMH:  has a past medical history of History of blood transfusion.   Active problems:   ? Heparin induced thrombocytopenia  ? S/P Aortic valve replacement     OBJECTIVE:     Height:   Admit weight: 102.1 kg (225 lb 1.4 oz) Last charted weight: 102.1 kg (225 lb 1.4 oz) IBW:   kg Body mass index is 36.33 kg/(m^2).    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 Amiodarone (currently being tapered down), acetaminophen (no use)   Warfarin education status: Completed on 02/16/16     PLAN:     Warfarin:  ? Continue warfarin at 3 mg daily for today. I expect the drastic decrease in INR is from recent holding of warfarin on both 9/6 and 9/8.   ? Since pt is at risk for thrombosis with recent HIT  diagnosis, MD agreed to add on fondaparinux 10 mg SQ daily (wt >100 kg) until INR >2 for 48 h. No renal adjustments necessary at this time.   ? Daily INR/PT ordered  ? Pharmacy to monitor and adjust dosing as needed    Signed:  Vershawn Westrup L. Aminata [place], [facility]

## 2016-02-22 LAB — PROTIME-INR
INR: 1.9 — ABNORMAL HIGH (ref 0.9–1.2)
Protime: 20.6 Seconds — ABNORMAL HIGH (ref 10.0–13.0)

## 2016-02-22 MED ORDER — warfarin (COUMADIN) tablet 4 mg
2 | Freq: Every day | ORAL | Status: DC
Start: 2016-02-22 — End: 2016-02-23
  Administered 2016-02-23: 01:00:00 2 mg via ORAL

## 2016-02-22 MED FILL — FONDAPARINUX 10 MG/0.8 ML SUBCUTANEOUS SOLUTION SYRINGE: 10 | SUBCUTANEOUS | Qty: 0.8

## 2016-02-22 MED FILL — CARVEDILOL 3.125 MG TABLET: 3.125 mg | ORAL | Qty: 2

## 2016-02-22 MED FILL — COUMADIN 2 MG TABLET: 2 mg | ORAL | Qty: 1

## 2016-02-22 MED FILL — FUROSEMIDE 40 MG TABLET: 40 mg | ORAL | Qty: 1

## 2016-02-22 MED FILL — AMIODARONE 200 MG TABLET: 200 mg | ORAL | Qty: 2

## 2016-02-22 MED FILL — ATORVASTATIN 20 MG TABLET: 20 mg | ORAL | Qty: 1

## 2016-02-22 MED FILL — COUMADIN 1 MG TABLET: 1 mg | ORAL | Qty: 1

## 2016-02-22 MED FILL — FAMOTIDINE 20 MG TABLET: 20 mg | ORAL | Qty: 1

## 2016-02-22 MED FILL — BISACODYL 5 MG TABLET,DELAYED RELEASE: 5 mg | ORAL | Qty: 2

## 2016-02-22 MED FILL — ASPIRIN 81 MG TABLET,ENTERIC COATED: 81 mg | ORAL | Qty: 1

## 2016-02-22 MED FILL — KLOR-CON M10 MEQ TABLET,EXTENDED RELEASE: 10 meq | ORAL | Qty: 2

## 2016-02-22 NOTE — Treatment Summary (Signed)
[facility] OT TREATMENT NOTE    [name] / 2303/2303-01 DOB:  10-14-1955 / 60 y.o. Date:  02/22/2016     Precautions  Specific mobility precautions: Sternal precautions  Sternal Precautions: Standard     ASSESSMENT     Summary:  Pt. Demonstrates appropriate sit to stand within sternal precautions.  She verbalizes undertanding of DME tub transfer bench and 4ww, my benefit from compression sock aid for spouse to assist, and appropriate footwear for outside mobility due to B. Foot edema. Provided written information regarding ACCESS and style of shoe/boot to accomodate swelling.  Pt. Demonstrated set up and supervision only for toileting, shower, hand hygiene at the sink, UB & LB dressing.  She will benefit from further energy conservation techniques with ADL/iADLs, practice with tub transfers with simulated tub lip.  Cont. OT per POC.        Activity Tolerance:    Activity tolerance: Tolerates 30 min activity with multiple rests   Safety Awareness:     Precaution Awareness: Decreased awareness of precaution(s)  Safety Judgment: Good awareness of safety     PLAN     Treatment Plan Frequency   .  Karletta will benefit from Treatment Plan: Continue per established POC.  Pt. demonstrates limitations which require skilled OT intervention.. Intensity/Frequency: 90 minutes daily, 5x/week     [name] will remain on the Occupational Therapy schedule until goals are met, or there has been a change in the Plan of Care.          Treatment Diagnosis Surgery (if applicable)    Primary Dx: Respriatory failure, s/p AVR  .See H & P     SUBJECTIVE   Pt. Agreeable to OT treatments.     Pain Level  Current status: Pain does not limit pt's ability to participate in therapy  Pain at start of session: 0 - No Pain  Pain at end of session: 0 - No Pain     OBJECTIVE   OT focused on ADLs including safety awareness, energy conservation techniques review of DME & adaptive equipment, cues at times to lock brakes on 4ww, and  activity tolerance during self cares including shower, grooming, UB/LB dressing.     Mental status  General Demeanor: Pleasant;Cooperative  LOC: Alert  Orientation: Oriented x 4  Directions: Follows one-step commands  --> Follows one-step commands: Consistently  Attention span: Appears intact  Memory: Appears intact in social/therapy situations  Awareness of errors: Good awareness of errors  Safety judgment: Good awareness of safety precautions       TREATMENT  Transfers/Bed Mobility  Transition  Assist Level/Comments   Rolling      --> Sitting up     --> Standing From bed: SBA   Pivot Transfer     --> Sitting down     --> Supine     Scooting up in bed     ADLs/Transfers     ADL   Assist Level/Comments   Eating (Meal prep, self feeding)     Grooming     Upper Body Dressing     Lower Body Dressing Modified Independence (to don socks)   Bathing     Toileting     Toilet Transfer     Tub or Engineer, structural     Where assessed: Wheelchair    Training/Education  Trainee(s): Producer, television/film/video Learner: Patient  Engineer, production - Barriers to Learning?: No  Training provided: ADL retraining, DME education  Response to  training: receptive   Safety/Room set-up   Call button accessible?: Yes  Position on departure (upright): In wheelchair     GOALS     Patient/Caregiver goals reviewed and integrated with rehab treatment plan:     Multidisciplinary Problems (Active)        Problem: ADLs    Goal Priority Disciplines Outcome   ADL Activity Tolerance for Self Care     OT    Description:  Pt  to tolerate 10  minutes of self care activity in standing position    ADL Grooming     OT    Description:  Patient to be able to perform grooming with mod I assist.    ADL Toilet Transfers     OT    Description:  Patient to be able to perform toilet transfers with modified independence..     ADL Tub Transfers     OT    Description:  Patient to be able to perform tub/shower transfers with SBA.    ADL Upper Body Bathing     OT       Description:  Patient to be able to perform upper body bathing with mod I assist.    ADL Lower Body Bathing     OT    Description:  Patient to be able to perform lower body bathing with stand by assist.      ADL Upper Body Dressing     OT    Description:  Patient to be able to perform upper body dressing with mod I assist.    ADL Lower Body Dressing     OT    Description:  Patient to be able to perform lower body dressing with modified independence. .     ADL Toileting     OT    Description:  Patient to be able to perform toilet hygiene and clothes management with modified independence.     Caregiver     OT    Description:  Caregiver will demonstrate the ability to assist with patient's care, as needed.           Problem: IP Post Cardiac Goal List    Goal Priority Disciplines Outcome   Precautions     OT    Description:  Patient to follow sternal  precautions, while performing ADLs.           Problem: Safety    Goal Priority Disciplines Outcome   Safety DME     OT    Description:  Pt/Caregiver to demonstrate awareness of DME needs for safety upon discharge.                  First session Second session (if applicable)    Start/Stop times: 0830 - 0915 Start/Stop times: 1150 - Stop Time: 1230   Total time: 45 minutes  Total time: 40 minutes     Therapist:  Deshondra Worst, OT

## 2016-02-22 NOTE — Telephone Encounter (Signed)
Patient is scheduled for a New TCM with Dr. Sudie Bailey on Friday 9/22.  Patient will be discharging on 9/16 per Dr. Lynnea Ferrier.    Melissa from [facility] [address] scheduled the appointment and will provide patient with all appointment information.

## 2016-02-22 NOTE — Discharge Instructions (Signed)
Medications:  Dr Solomon orders your first 30 days of any new medication post discharge  with a no refill policy because your Primary Care Physician is in charge of all your future medications. Your pharmacy should use your primary physician for all future medication refills.    Dr Solomon expects that you will see your physician soon after leaving the hospital.  We normally make that appointment for you and you will find it on this summary.    Dr Solomon's office is not the contact for any post discharge medication needs.  If there is an issue that needs attention immediately after your hospital discharge,   please refer it to [phone].    Follow up appointments:  We have listed providers and follow up appointments on this   After Visit Discharge Summary for you.  We can not over emphasize the importance of keeping these appointments.  Physicians listed will provide vital information, monitoring and medication refills.  Should you need to change an appointment, please contact that provider as soon as possible so you may reschedule close to the original follow up date.

## 2016-02-22 NOTE — Discharge Planning (AHS/AVS) (Addendum)
Spoke with patient to update on [employer].  DC date Saturday 9/16 and follow ups discussed.  Patient agreed.  She is trying to get a hold of her husband to arrive for CGT on Wed 9/13 at 11AM.    [facility] Intake, Zollie Scale, face sheet and notes.  There is no actual physical address and it appears to be 10 miles outside the coverage area of [facility].  Zollie Scale is requesting her Administrator make an exception.  If not, Zollie Scale was so kind and gave me the [facility] number   [phone].  They declined admit at 1530.  We will need to arrange [facility] and [facility].    [facility] in [place] has a 4WW and TTB, requested orders asap from therapists.    1700 Spoke with [facility] about no Home Health service in her area.  We will set up Coumadin Clinic and Outpatient Therapies.  She has no PCP so she must see a physician at the [facility] TCM office next week and the appts for those hospital follow ups are early morning appts.  She thinks she will stay at her sisters to make the 0730.  We discussed the importance of this visit as that office will set her up with a new PCP who will follow her Coumadin.  Dr Lynnea Ferrier has graciously offered to do this but must hand it off to a primary physician.

## 2016-02-22 NOTE — Progress Notes (Signed)
PMR Physician Daily Note     Patient seen and examined today and full rehab team meeting and team rounds were held, please see separate report.       General and Subjective:   Feels well this am, no new complaints, continues to work hard      Observations and Exam:  Vital Sign Ranges for Last 24 Hours:  BP  Min: 134/81  Max: 136/83  Temp  Min: 36.3 ?C (97.3 ?F)  Max: 36.4 ?C (97.5 ?F)  Pulse  Min: 78  Max: 80  Resp  Min: 18  Max: 22  SpO2  Min: 96 %  Max: 98 %    Alert, no distress  Speech clear  Neck supple, no jVD  Heart is regular  Lungs clear  Legs with slightly improved edema but still present.   Necrotic areas on hands/feet noted, stable.         Recent Glucose Monitoring  Lab Results   Component Value Date    POCGLU 111 (H) 02/15/2016    POCGLU 118 (H) 02/10/2016    POCGLU 91 02/10/2016    POCGLU 162 (H) 02/10/2016    POCGLU 102 (H) 02/10/2016           Intake/Output Summary (Last 24 hours) at 02/22/16 0851  Last data filed at 02/22/16 6578   Gross per 24 hour   Intake              740 ml   Output             2375 ml   Net            -1635 ml       Recent Labs  Results for orders placed or performed during the hospital encounter of 02/17/16 (from the past 24 hour(s))   Protime Panel -Now & Q AM    Collection Time: 02/22/16  6:25 AM   Result Value Ref Range    Protime 20.6 (H) 10.0 - 13.0 Seconds    INR 1.9 (H) 0.9 - 1.2       [x]     I have reviewed the Rehabilitation  Interdisciplinary Notes.   Medical/Function Problems:   Patient Active Problem List    Diagnosis SNOMED CT(R) Date Noted   . Aortic valve disorder AORTIC VALVE DISORDER 02/17/2016   . Impaired gait and mobility ABNORMAL GAIT 02/17/2016   . Impaired activities of daily living ACTIVITY OF DAILY LIVING (ADL) ALTERATION 02/17/2016   . Peripheral edema PERIPHERAL EDEMA 02/17/2016   . Ischemia of digits of hands and feet UPPER LIMB ISCHEMIA 02/17/2016   . Blood loss anemia ANEMIA DUE TO BLOOD LOSS 02/17/2016   . Hypokalemia HYPOKALEMIA 02/17/2016   .  Acute renal failure with tubular necrosis (CMS/HCC) ACUTE RENAL FAILURE SYNDROME 02/13/2016   . Heparin induced thrombocytopenia (CMS/HCC) HEPARIN-INDUCED THROMBOCYTOPENIA 02/02/2016   . S/P AVR (aortic valve replacement) HISTORY OF AORTIC VALVE REPLACEMENT 01/27/2016   . Non-ischemic cardiomyopathy (CMS/HCC) CARDIOMYOPATHY 01/23/2016   . Respiratory failure (CMS/HCC) RESPIRATORY FAILURE 01/23/2016   . Aortic stenosis, severe AORTIC VALVE STENOSIS 01/22/2016   . Acute systolic heart failure (CMS/HCC) ACUTE SYSTOLIC HEART FAILURE 01/22/2016   . Heart failure (CMS/HCC) HEART FAILURE 01/21/2016   . Elevated brain natriuretic peptide (BNP) level HORMONE LEVEL - FINDING 01/21/2016   . NSTEMI (non-ST elevated myocardial infarction) (CMS/HCC) ACUTE NON-ST SEGMENT ELEVATION MYOCARDIAL INFARCTION 01/21/2016   . Demand ischemia of myocardium (CMS/HCC) ACUTE ISCHEMIC HEART DISEASE 01/21/2016   .  Tachycardia TACHYCARDIA 01/21/2016   . Pleural effusion PLEURAL EFFUSION 01/21/2016   . Obesity OBESITY 01/21/2016   . Acute respiratory failure with hypoxia (CMS/HCC) ACUTE RESPIRATORY FAILURE 01/21/2016   . Hypotension LOW BLOOD PRESSURE 01/21/2016   . Iron deficiency anemia IRON DEFICIENCY ANEMIA 01/21/2016   . Thrombocytosis (CMS/HCC) THROMBOCYTOSIS 01/21/2016   . Weight loss WEIGHT DECREASED 01/21/2016   . Family history of cancer FAMILY HISTORY OF CANCER 01/21/2016   . Cardiomegaly CARDIOMEGALY 01/21/2016   . Mediastinal lymphadenopathy MEDIASTINAL LYMPHADENOPATHY 01/21/2016   . Hilar lymphadenopathy- bilataeral  HILAR LYMPHADENOPATHY 01/21/2016   . Chest pain CHEST PAIN 01/21/2016   . Dyspnea and respiratory abnormality DYSPNEA 01/21/2016   . Abnormal EKG ELECTROCARDIOGRAM ABNORMAL 01/21/2016   . Opacity of lung on imaging study ABNORMAL FINDINGS ON DIAGNOSTIC IMAGING OF LUNG 01/21/2016   . Liver lesion- small hypodense lesion around falciform lig LESION OF LIVER 01/21/2016       Impression and Plan:    60 year-old woman who has  had bioprosthetic aortic valve replacement. ?Her hospitalization characterized by multiple severe issues including HIT, atrial fib, ARF, CHF, Non-STEMI, severe blood-loss anemia (thought to be from GI source), dysphagia, persistent pulmonary insufficiency, and general weakness and deconditioning. ?Continues with marked edema in both lower limbs, though improving. ?Has multiple areas of ischemic changes in fingers and toes, overall of moderate severity, no evidence of infection at this time. ?  . Peripheral edema: improving.  Continues with lasix, overall resolving.  Discussed importance of adequate nutrition and protein as well.  Will monitor daily weights   . Ischemia of digits of hands and feet: ?Continues to demarcated, WOCN following, will monitor. ?This is due to pressors and shock.     . Blood loss anemia: improving.    . Hypokalemia: ?resolved but needs monitoring.    . Heparin induced thrombocytopenia: ?Continue coumadin, currently in sub-therapeutic range, on bridge therapy with arixtra, INR up to 1.9 today so anticipating can DC arixtra tomorrow.    . S/P AVR (aortic valve replacement): ?As above. Maintain and reinforce sternotomy precautions   . Non-ischemic cardiomyopathy: ?Continue with coreg. ?She's also on amiodarone, will wean as directed by CT surgery service.              [x]     Patient remains appropriate for inpatient rehabilitation facility level of care, continue present therapies and medical treatment and monitoring.    []     See orders for any treatment modifications     Hansel Feinstein  02/22/2016 8:51 AM

## 2016-02-22 NOTE — Progress Notes (Signed)
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 admitted on 02/17/2016 with a NSTEMI s/p R&L heart catheterization. PICC line placed 8/25. CRRT from 8/20-8/25. 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     Allergies: Heparin and Sulfa (sulfonamide antibiotics)  PMH:  has a past medical history of History of blood transfusion.   Active problems:   ? Heparin induced thrombocytopenia  ? S/P Aortic valve replacement     OBJECTIVE:     Height:   Admit weight: 102.1 kg (225 lb 1.4 oz) Last charted weight: 102.1 kg (225 lb 1.4 oz) IBW:   kg Body mass index is 36.33 kg/(m^2).    Recent Labs      02/21/16   0651   NA  142   K  3.7   CL  110   CO2  23.6   GLU  93   BUN  8   CREATININES  0.80   CALCIUM  9.3     Recent Labs      02/21/16   0651   WHITEBLOODCE  5.4   NEUTROABS  3.3   HGB  9.4*   HCT  29.9*   LABPLAT  225     Warfarin Dosing Record:  Date: 8/31 9/1 9/2 9/3 9/4 9/5 9/6 9/7 9/8 9/9 9/10 9/11   INR: 2.9 3 4.1 2.8 3.2 4.0 4.2 @ 0517  3.6 @ 1055  3.8 @ 1530 2.8 3.1 2 1.2 1.8   Warfarin: 3 mg 3 mg 2.5 3 mg 5 mg 5 mg Hold 3mg  Hold 3 mg 3 mg 3 mg     Date: 9/12              INR: 1.9              Warfarin:                   ASSESSMENT:     Recent vitamin K administration: None noted in Epic   Warfarin Indication:  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 Amiodarone (currently being tapered down), acetaminophen (no use)   Warfarin education status: Completed on 02/16/16     PLAN:     Warfarin:  ? Increase warfarin to 4 mg daily.   ? Since pt is at risk for thrombosis with recent HIT diagnosis, MD agreed to add on  fondaparinux 10 mg SQ daily (wt >100 kg) until INR >2 for 48 h. No renal adjustments necessary at this time.   ? Daily INR/PT ordered  ? Pharmacy to monitor and adjust dosing as needed    Signed:  Gayl Ivanoff L. Bartley Vuolo, [facility]

## 2016-02-22 NOTE — Treatment Summary (Addendum)
[facility] PT TREATMENT NOTE    [name] / 2303/2303-01 DOB:  [birth year] / 60 y.o. Date:  02/22/2016     Precautions  Specific mobility precautions: Sternal precautions  Sternal precautions: Standard  Required Braces/Orthotics: None  Other: Very sore (R) 3rd toe; Ejection fraction estimated to be 30-35%     ASSESSMENT     Summary:  Pt progressing well with therapy. Improving activity tolerance daily, able to amb 180 ft with SBA 4ww. Negotiates steps with BHR CGA. Occasional vc for sternal precautions. Continues with limitations in balance due to ischemic issues with LE's. CG training with husband set for tomorrow. Very motivated and pleasant to work with, tries very hard.     Activity tolerance: Tolerates 30 min activity with multiple rests  Comments: progressing     Precaution Awareness: Fully aware of precaution(s)  Deficit Awareness: Fully aware of deficits  Correction of Errors: Self-corrects with cues  Safety Judgment: Good awareness of safety     DME Needs:   DME Needs: 1OX     PLAN     Intensity/Frequency:  Duration:   Intensity/frequency: 90 minutes daily, 5x/week  14 days      Continue intensive level of Physical therapy to address progressive mobility training, caregiver training and DME needs.          Treatment Diagnosis Surgery (if applicable)   Primary Dx: Acute cardio-pulmonary failure; severe aortic stenosis which required aortic valve replacement  Treatment Dx: Impaired mobility  Procedure/Date: 01/27/2016: Aortic valve replacement       SUBJECTIVE   I can tell I'm getting better     Pain Level  Current status: Pain does not limit pt's ability to participate in therapy  Pain at start of session: 0 - No Pain  Pain at end of session: 4  Pain with activity: 4  Pain location: Hip  Pain descriptors: Aching     OBJECTIVE   Standing balance activities with balance pad: step up and down fwd and backwards.  Sidestepping and backwards gait with and without UE support. Short distance ambulation no  AD (pt demo'd increased lateral trunk sway and increased fatigue).   Step negotiation 5x4 with BHR CGA and single 6 curb step with 4ww CGABack to bed at end of each session. Call light in reach.     Mental status:   General Demeanor: Pleasant, Cooperative  LOC: Alert  Orientation: Oriented x 4  Oriented to: Person, Place, Situation  Directions: Follows multi-step commands  --> Follows multi-step commands: Consistently  --> Follows one-step commands: Consistently  Attention span: Appears intact  Memory: Appears intact in social/therapy situations   Medical appliances:   Medical Appliances  Medical Appliances: PICC line         TREATMENT    Bed Mobility/Transfers  Bed mobility       Rolling / logroll  Roll L: SBA      Transition to sitting up Sidelying to Sit: SBA   To resting position      Transition to sitting down       Transition to supine SBA     Scooting     Transfers      Transition to standing From bed: SBA  From wheelchair: SBA     Method Stand pivot: SBA;CGA          Gait Jackie Plum training:   Mobility - Gait (Level)  Gait (distance): 180 feet (multiple bouts)  Gait (assist level): SBA  Assistive device: Four  wheeled walker  Endurance: improving  Pace: moderate, cued to slow at times  Pattern: Downward gaze;Wide BOS;Decreased step length L;Decreased step length R  Surface: Level tile  Mobility - Gait (Stairs/curbs)  Number of Stairs: 5x4  Step height: 6  Curb height: 4  Pattern: Step to  Assist level: SBA;CGA (CGA for curb step with 4ww)  Assistive device: Four wheeled walker;Hand railing bilaterally      Ther. Ex.:   LE exercises: Standing hip abd/adduction;Standing hip flexion;Standing knee flexion;Standing mini squats  Standing hip abd/adduction: 10  Standing hip flexion: 10  Standing knee flexion: 10  Mini squats: 2x10  Addit. Exercises: biodex Biodex recumbent elliptical machine x 10 minutes, level 1 resistance, flat profile, bilateral UE/LE for interlimb coordination and conditioning.     Balance  training:   Balance - Basic  Static sitting balance: Assist level;Position  Assist level: Modified Independence  Position: Feet - supported  Static standing balance: Assist level  Assist level: SBA  Dynamic standing balance: Assist level  Assist level with weight shifts: SBA;CGA  Balance - Specific  Gait balance activities: Sidestepping;Walking backwards  Independence Level: CGA     Training/Education:  Trainee(s): Primary Learner  Primary Learner: Patient  Primary Learner - Barriers to Learning?: No  Training provided: Safety; step neogtiation; sternal precautions  Response to training: receptive   Safety/Room set-up:     Call button accessible?: Yes  Phone accessible?: Yes  Oxygen reconnected?: No, on room air  Patient mobility in room: Able to be up with staff (x 1).  Position on arrival: In bed  Position on departure (bed): Bed alarm on;2 bedrails up  Comments: Back to bed      GOALS     Patient/Caregiver goals reviewed and integrated with rehab treatment plan:     Multidisciplinary Problems (Active)        Problem: Caregiver Training    Goal Priority Disciplines Outcome   Caregivers to demonstrate safe patient assist, as needed     PT           Problem: DME    Goal Priority Disciplines Outcome   Durable Medical Equipment needs to be met     PT           Problem: Mobility: Ambulation    Goal Priority Disciplines Outcome   Gait (with precautions)     PT    Description:  Pt. to ambulate 150 feet at modified independent level, following sternal precautions, using 4WW.    Stairs (with precautions)     PT    Description:  Pt. to ascend/descend 6 stairs with SBA, using (B) rails, following sternal precautions.            Problem: Mobility: Transfers    Goal Priority Disciplines Outcome   Supine to/from sitting (with precautions)     PT    Description:  Pt. to transfer to/from sitting at modified independent level, following sternal precautions.    Bed to/from chair (with precautions)     PT    Description:  Pt. to  transfer bed to/from chair at modified independent level, following sternal precautions.           Problem: Patient/Family Goal    Goal Priority Disciplines Outcome   Home     PT    Description:  Pt. wants to return to previous living situation.           Problem: Precautions    Goal Priority Disciplines Outcome   Application  of Precautions     PT    Description:  Pt. to verbally recall and follow all pertinent precautions, including sternal precautions.                  First session Second session (if applicable)   Start/Stop times: 1000 - 1045 Start/Stop times: 1330 - 1415   Total time: 45 minutes  Total time: 45 minutes      Therapist:  Penelope Coop, PT

## 2016-02-23 LAB — PROTIME-INR
INR: 1.8 — ABNORMAL HIGH (ref 0.9–1.2)
Protime: 20.2 Seconds — ABNORMAL HIGH (ref 10.0–13.0)

## 2016-02-23 MED ORDER — warfarin (COUMADIN) tablet 5 mg
5 | Freq: Every day | ORAL | Status: DC
Start: 2016-02-23 — End: 2016-02-25
  Administered 2016-02-24 – 2016-02-25 (×2): 5 mg via ORAL

## 2016-02-23 MED FILL — ACETAMINOPHEN 325 MG TABLET: 325 mg | ORAL | Qty: 2

## 2016-02-23 MED FILL — ATORVASTATIN 20 MG TABLET: 20 mg | ORAL | Qty: 1

## 2016-02-23 MED FILL — FONDAPARINUX 10 MG/0.8 ML SUBCUTANEOUS SOLUTION SYRINGE: 10 | SUBCUTANEOUS | Qty: 0.8

## 2016-02-23 MED FILL — FAMOTIDINE 20 MG TABLET: 20 mg | ORAL | Qty: 1

## 2016-02-23 MED FILL — CARVEDILOL 3.125 MG TABLET: 3.125 mg | ORAL | Qty: 2

## 2016-02-23 MED FILL — AMIODARONE 200 MG TABLET: 200 mg | ORAL | Qty: 1

## 2016-02-23 MED FILL — COUMADIN 2 MG TABLET: 2 mg | ORAL | Qty: 2

## 2016-02-23 MED FILL — KLOR-CON M10 MEQ TABLET,EXTENDED RELEASE: 10 meq | ORAL | Qty: 2

## 2016-02-23 MED FILL — ASPIRIN 81 MG TABLET,ENTERIC COATED: 81 mg | ORAL | Qty: 1

## 2016-02-23 MED FILL — FUROSEMIDE 40 MG TABLET: 40 mg | ORAL | Qty: 1

## 2016-02-23 NOTE — Other (Signed)
Mobility Equipment Prescription    Patient name:  [name] DOB:  [birth year] Date:  02/23/2016      Anticipated D/C date:  02/26/16  Length of need:  Lifetime     Ht Readings from Last 1 Encounters:   01/25/16 167.6 cm (66)     Wt Readings from Last 1 Encounters:   02/23/16 90.5 kg (199 lb 8.3 oz)     Diagnosis:    Patient Active Problem List   Diagnosis SNOMED CT(R)   . Heart failure (CMS/HCC) HEART FAILURE   . Elevated brain natriuretic peptide (BNP) level HORMONE LEVEL - FINDING   . NSTEMI (non-ST elevated myocardial infarction) (CMS/HCC) ACUTE NON-ST SEGMENT ELEVATION MYOCARDIAL INFARCTION   . Demand ischemia of myocardium (CMS/HCC) ACUTE ISCHEMIC HEART DISEASE   . Tachycardia TACHYCARDIA   . Pleural effusion PLEURAL EFFUSION   . Obesity OBESITY   . Acute respiratory failure with hypoxia (CMS/HCC) ACUTE RESPIRATORY FAILURE   . Hypotension LOW BLOOD PRESSURE   . Iron deficiency anemia IRON DEFICIENCY ANEMIA   . Thrombocytosis (CMS/HCC) THROMBOCYTOSIS   . Weight loss WEIGHT DECREASED   . Family history of cancer FAMILY HISTORY OF CANCER   . Cardiomegaly CARDIOMEGALY   . Mediastinal lymphadenopathy MEDIASTINAL LYMPHADENOPATHY   . Hilar lymphadenopathy- bilataeral  HILAR LYMPHADENOPATHY   . Chest pain CHEST PAIN   . Dyspnea and respiratory abnormality DYSPNEA   . Abnormal EKG ELECTROCARDIOGRAM ABNORMAL   . Opacity of lung on imaging study ABNORMAL FINDINGS ON DIAGNOSTIC IMAGING OF LUNG   . Liver lesion- small hypodense lesion around falciform lig LESION OF LIVER   . Aortic stenosis, severe AORTIC VALVE STENOSIS   . Acute systolic heart failure (CMS/HCC) ACUTE SYSTOLIC HEART FAILURE   . Non-ischemic cardiomyopathy (CMS/HCC) CARDIOMYOPATHY   . Respiratory failure (CMS/HCC) RESPIRATORY FAILURE   . S/P AVR (aortic valve replacement) HISTORY OF AORTIC VALVE REPLACEMENT   . 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 Acute renal failure with tubular necrosis (CMS/HCC) ACUTE RENAL  FAILURE SYNDROME   . Aortic valve disorder AORTIC VALVE DISORDER   . Impaired gait and mobility ABNORMAL GAIT   . Impaired activities of daily living ACTIVITY OF DAILY LIVING (ADL) ALTERATION   . Peripheral edema PERIPHERAL EDEMA   . 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 Blood loss anemia ANEMIA DUE TO BLOOD LOSS   . Hypokalemia HYPOKALEMIA     Gait:  Four wheeled walker (1OX)    Therapist name:   Abe People, PT    I have examined the patient and concur with the above recommendations of the therapist.    Authorized and  Signed.    _________________________________________________________  Physician name:  Eugenio Hoes. Lynnea Ferrier, DO ; NPI #: [id number]

## 2016-02-23 NOTE — Progress Notes (Signed)
PHARMACIST  PROGRESS NOTE     Physician Consult Request for Pharmacy:  Pharmacy to dose Warfarin and Argatroban for HIT         Summary: Brandy MITSUKO [name] is [name] admitted on 02/17/2016 with a NSTEMI s/p R&L heart catheterization. PICC line placed 8/25. CRRT from 8/20-8/25. 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     Allergies: Heparin and Sulfa (sulfonamide antibiotics)  PMH:  has a past medical history of History of blood transfusion.   Active problems:   ? Heparin induced thrombocytopenia  ? S/P Aortic valve replacement     OBJECTIVE:     Height:   Admit weight: 102.1 kg (225 lb 1.4 oz) Last charted weight: 90.5 kg (199 lb 8.3 oz) IBW:   kg Body mass index is 32.2 kg/(m^2).    Recent Labs      02/21/16   0651   NA  142   K  3.7   CL  110   CO2  23.6   GLU  93   BUN  8   CREATININES  0.80   CALCIUM  9.3     Recent Labs      02/21/16   0651   WHITEBLOODCE  5.4   NEUTROABS  3.3   HGB  9.4*   HCT  29.9*   LABPLAT  225     Warfarin Dosing Record:  Date: 8/31 9/1 9/2 9/3 9/4 9/5 9/6 9/7 9/8 9/9 9/10 9/11   INR: 2.9 3 4.1 2.8 3.2 4.0 4.2 @ 0517  3.6 @ 1055  3.8 @ 1530 2.8 3.1 2 1.2 1.8   Warfarin: 3 mg 3 mg 2.5 3 mg 5 mg 5 mg Hold 3mg  Hold 3 mg 3 mg 3 mg     Date: 9/12 9/13             INR: 1.9 1.8             Warfarin: 4 mg                  ASSESSMENT:     Recent vitamin K administration: None noted in Epic   Warfarin Indication:  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 Amiodarone (currently being tapered down), acetaminophen (no use)   Warfarin education status: Completed on 02/16/16     PLAN:     Warfarin:  ? Increase warfarin to 5 mg daily.   ? Since pt is at risk for thrombosis with recent HIT diagnosis, MD agreed to  add on fondaparinux 10 mg SQ daily (wt >100 kg) until INR >2 for 48 h. No renal adjustments necessary at this time.   ? Daily INR/PT ordered  ? Pharmacy to monitor and adjust dosing as needed    Signed:  Mackenzie Lia L. Philisha Weinel, [facility]

## 2016-02-23 NOTE — Progress Notes (Signed)
PMR Physician Daily Note     General and Subjective: doing well, no new complaints or issues, slept well      Observations and Exam:  Vital Sign Ranges for Last 24 Hours:  BP  Min: 103/65  Max: 134/81  Temp  Min: 36.6 ?C (97.8 ?F)  Max: 36.6 ?C (97.8 ?F)  Pulse  Min: 78  Max: 82  Resp  Min: 18  Max: 18  SpO2  Min: 95 %  Max: 96 %    Alert, no distress  Speech clear  Neck supple, no JVD  Heart is regualr  Lungs clear  Leg edema considerably improved  Ischemic digits:  Stable, no signs of active infections.           Recent Glucose Monitoring  Lab Results   Component Value Date    POCGLU 111 (H) 02/15/2016    POCGLU 118 (H) 02/10/2016    POCGLU 91 02/10/2016    POCGLU 162 (H) 02/10/2016    POCGLU 102 (H) 02/10/2016           Intake/Output Summary (Last 24 hours) at 02/23/16 0731  Last data filed at 02/22/16 1947   Gross per 24 hour   Intake             1910 ml   Output              200 ml   Net             1710 ml       Recent Labs  Results for orders placed or performed during the hospital encounter of 02/17/16 (from the past 24 hour(s))   Protime Panel -Now & Q AM    Collection Time: 02/23/16  6:24 AM   Result Value Ref Range    Protime 20.2 (H) 10.0 - 13.0 Seconds    INR 1.8 (H) 0.9 - 1.2       [x]     I have reviewed the Rehabilitation  Interdisciplinary Notes.   Medical/Function Problems:   Patient Active Problem List    Diagnosis SNOMED CT(R) Date Noted   . Aortic valve disorder AORTIC VALVE DISORDER 02/17/2016   . Impaired gait and mobility ABNORMAL GAIT 02/17/2016   . Impaired activities of daily living ACTIVITY OF DAILY LIVING (ADL) ALTERATION 02/17/2016   . Peripheral edema PERIPHERAL EDEMA 02/17/2016   . Ischemia of digits of hands and feet UPPER LIMB ISCHEMIA 02/17/2016   . Blood loss anemia ANEMIA DUE TO BLOOD LOSS 02/17/2016   . Hypokalemia HYPOKALEMIA 02/17/2016   . Acute renal failure with tubular necrosis (CMS/HCC) ACUTE RENAL FAILURE SYNDROME 02/13/2016   . Heparin induced thrombocytopenia  (CMS/HCC) HEPARIN-INDUCED THROMBOCYTOPENIA 02/02/2016   . S/P AVR (aortic valve replacement) HISTORY OF AORTIC VALVE REPLACEMENT 01/27/2016   . Non-ischemic cardiomyopathy (CMS/HCC) CARDIOMYOPATHY 01/23/2016   . Respiratory failure (CMS/HCC) RESPIRATORY FAILURE 01/23/2016   . Aortic stenosis, severe AORTIC VALVE STENOSIS 01/22/2016   . Acute systolic heart failure (CMS/HCC) ACUTE SYSTOLIC HEART FAILURE 01/22/2016   . Heart failure (CMS/HCC) HEART FAILURE 01/21/2016   . Elevated brain natriuretic peptide (BNP) level HORMONE LEVEL - FINDING 01/21/2016   . NSTEMI (non-ST elevated myocardial infarction) (CMS/HCC) ACUTE NON-ST SEGMENT ELEVATION MYOCARDIAL INFARCTION 01/21/2016   . Demand ischemia of myocardium (CMS/HCC) ACUTE ISCHEMIC HEART DISEASE 01/21/2016   . Tachycardia TACHYCARDIA 01/21/2016   . Pleural effusion PLEURAL EFFUSION 01/21/2016   . Obesity OBESITY 01/21/2016   . Acute respiratory failure with hypoxia (CMS/HCC) ACUTE RESPIRATORY  FAILURE 01/21/2016   . Hypotension LOW BLOOD PRESSURE 01/21/2016   . Iron deficiency anemia IRON DEFICIENCY ANEMIA 01/21/2016   . Thrombocytosis (CMS/HCC) THROMBOCYTOSIS 01/21/2016   . Weight loss WEIGHT DECREASED 01/21/2016   . Family history of cancer FAMILY HISTORY OF CANCER 01/21/2016   . Cardiomegaly CARDIOMEGALY 01/21/2016   . Mediastinal lymphadenopathy MEDIASTINAL LYMPHADENOPATHY 01/21/2016   . Hilar lymphadenopathy- bilataeral  HILAR LYMPHADENOPATHY 01/21/2016   . Chest pain CHEST PAIN 01/21/2016   . Dyspnea and respiratory abnormality DYSPNEA 01/21/2016   . Abnormal EKG ELECTROCARDIOGRAM ABNORMAL 01/21/2016   . Opacity of lung on imaging study ABNORMAL FINDINGS ON DIAGNOSTIC IMAGING OF LUNG 01/21/2016   . Liver lesion- small hypodense lesion around falciform lig LESION OF LIVER 01/21/2016       Impression and Plan:    Overall doing well, improving  H&H, K+, and vitals are stable  Her diuresis is going well, HR in excellent range  For HIT, her INR remains subtherapeutic,  continues with arixtra as bridge  Amiodarone weaning going very well  Continue rehab plan of care.       [x]     Patient remains appropriate for inpatient rehabilitation facility level of care, continue present therapies and medical treatment and monitoring.    []     See orders for any treatment modifications     Hansel Feinstein  02/23/2016 7:31 AM

## 2016-02-23 NOTE — Treatment Summary (Signed)
[facility] PT TREATMENT NOTE    [name] / [id number] DOB:  09-Sep-1955 / 60 y.o. Date:  02/23/2016     Precautions  Specific mobility precautions: Sternal precautions  Sternal precautions: Standard  Required Braces/Orthotics: None  Other: Very sore (R) 3rd toe; Ejection fraction estimated to be 30-35%     ASSESSMENT     Summary:  Excellent gains toward therapy goals.  Husband unavailable for caregiver training today. Patient stated that he more than likely will not be coming from their home in [place], [place]. Sister, Brandy [name],  stated she will be available to transport patient to [place] and stay for a week or longer as needed.   Performed caregiver training with sister on car transfer technique. Training successful, both patient and sister demonstrated safe transfer from w/c<=> car with 4WW adhering to sternal precautions. Sister will be available for tomorrows 9:45 session for caregiver training in functional transfers, stair negotiation and safe gait training.  Primary PT notified of pt's new situation for discharge home.   Continues to work hard and demonstrate safety with all mobility tasks. Able to state and adhere to sternal precautions 100% of the time.   On track for discharge 02/26/16.       Activity tolerance: Tolerates 30 min activity with multiple rests  Comments: Improving daily      Precaution Awareness: Fully aware of precaution(s)  Deficit Awareness: Fully aware of deficits  Correction of Errors: Self-corrects with cues  Safety Judgment: Good awareness of safety     DME Needs:   DME Needs: 1OX     PLAN     Intensity/Frequency:  Duration:   Intensity/frequency: 90 minutes daily, 5x/week  14 days      Continue intensive level of Physical therapy to address progressive mobility training, caregiver training and DME needs.          Treatment Diagnosis Surgery (if applicable)   Primary Dx: Acute cardio-pulmonary failure; severe aortic stenosis which required aortic valve replacement  Treatment  Dx: Impaired mobility  Procedure/Date: 01/27/2016: Aortic valve replacement       SUBJECTIVE   First session supine in bed, agreeable to PT.   Second session supine in bed with sister, Brandy [name] present, agreeable to PT.      Pain Level  Current status: Pain does not limit pt's ability to participate in therapy  Pain at start of session: 0 - No Pain  Pain at end of session: 0 - No Pain     OBJECTIVE   Husband, [name], wasn't present first session for CGT. Patient will call and remind him. If present second session will have CGT for car transfer, stair negotiation.   Husband, not available for CGT.   CGT with sister Brandy [name] for car transfer. Will continue training with sister in tomorrows session at 9:45.      Mental status:   General Demeanor: Pleasant, Cooperative  LOC: Alert  Orientation: Oriented x 4  Oriented to: Person, Place, Situation  Directions: Follows multi-step commands  --> Follows multi-step commands: Consistently  --> Follows one-step commands: Consistently  Attention span: Appears intact  Memory: Appears intact in social/therapy situations   Medical appliances:   Medical Appliances  Medical Appliances: PICC line     Other:    Vitals  1: Position;Activity;BP;HR;SPO2  Position: Sitting  Activity: Rest  BP: 105/54  HR: 89  SPO2: 98%      TREATMENT  Bed Mobility/Transfers  Bed mobility       Rolling /  logroll  Roll L: Modified Independent      Transition to sitting up Sidelying to Sit: Modified Independent   To resting position      Transition to sitting down SBA;Verbal cues     Transition to supine Modified Independent     Scooting     Transfers      Transition to standing From bed: SBA  From wheelchair: SBA;Verbal cues     Method Stand pivot: SBA        Gait Jackie Plum training:   Mobility - Gait (Level)  Gait (distance): 180 feet (multiple trials )  Gait (assist level): SBA  Assistive device: Four wheeled walker  Endurance: improving   Pace: moderate, VC's to slow pace at times   Pattern: Wide BOS;Downward  gaze  Surface: Level tile;Carpet;Outdoors;Uneven terrain  Mobility - Gait (Stairs/curbs)  Number of Stairs: 5x4 trials; 6 platform step   Step height: 6  Pattern: Step to  Assist level: SBA;CGA  Assistive device: Four wheeled walker;Hand railing bilaterally (0NU for platform step to simulate home environment. )    Ther. Ex.:   Addit. Exercises: biodex Biodex recumbent elliptical machine x 10 minutes, level 1resistance, flat profile, bilateral LE only to adhere to sternal precautions, for interlimb coordination and conditioning.     Balance training:   Balance - Basic  Static sitting balance: Assist level;Position  Assist level: Modified Independence  Position: Feet - supported  Static standing balance: Assist level  Assist level: SBA  Dynamic standing balance: Assist level  Assist level with weight shifts: SBA;CGA   Training/Education:  Trainee(s): Primary Learner  Primary Learner: Patient  Primary Learner - Barriers to Learning?: No  Co-learner: Family member (Sister Best boy )  Co-Learner - Barriers to Learning?: No  Training provided: Conservator, museum/gallery   Response to training: receptive   Safety/Room set-up:     Call button accessible?: Yes  Phone accessible?: Yes  Oxygen reconnected?: No, on room air  Patient mobility in room: Able to be up with Nursing Team (x 1).  Position on arrival: In bed  Position on departure (bed): Standard bed  Comments: Left supine in bed with HOB elevated with sister present, needs met.      GOALS     Patient/Caregiver goals reviewed and integrated with rehab treatment plan:     Multidisciplinary Problems (Active)        Problem: Caregiver Training    Goal Priority Disciplines Outcome   Caregivers to demonstrate safe patient assist, as needed     PT           Problem: DME    Goal Priority Disciplines Outcome   Durable Medical Equipment needs to be met     PT           Problem: Mobility: Ambulation    Goal Priority Disciplines Outcome   Gait (with precautions)     PT    Description:  Pt. to  ambulate 150 feet at modified independent level, following sternal precautions, using 4WW.    Stairs (with precautions)     PT    Description:  Pt. to ascend/descend 6 stairs with SBA, using (B) rails, following sternal precautions.            Problem: Mobility: Transfers    Goal Priority Disciplines Outcome   Supine to/from sitting (with precautions)     PT    Description:  Pt. to transfer to/from sitting at modified independent level, following sternal precautions.    Bed to/from  chair (with precautions)     PT    Description:  Pt. to transfer bed to/from chair at modified independent level, following sternal precautions.           Problem: Patient/Family Goal    Goal Priority Disciplines Outcome   Home     PT    Description:  Pt. wants to return to previous living situation.           Problem: Precautions    Goal Priority Disciplines Outcome   Application of Precautions     PT    Description:  Pt. to verbally recall and follow all pertinent precautions, including sternal precautions.                  First session Second session (if applicable)   Start/Stop times: 1000 - 1045 Start/Stop times: 1400 - 1445   Total time: 45 minutes  Total time: 45 minutes      Therapist:  Fenton Malling, PTA

## 2016-02-23 NOTE — Other (Signed)
Mobility Equipment Prescription    Patient name:  [name] DOB:  1955-10-16 Date:  02/23/2016      Anticipated D/C date: February 19, 2016  Length of need:  99 years     Ht Readings from Last 1 Encounters:   01/25/16 167.6 cm (66)     Wt Readings from Last 1 Encounters:   02/23/16 90.5 kg (199 lb 8.3 oz)     Diagnosis:    Patient Active Problem List   Diagnosis SNOMED CT(R)   . Heart failure (CMS/HCC) HEART FAILURE   . Elevated brain natriuretic peptide (BNP) level HORMONE LEVEL - FINDING   . NSTEMI (non-ST elevated myocardial infarction) (CMS/HCC) ACUTE NON-ST SEGMENT ELEVATION MYOCARDIAL INFARCTION   . Demand ischemia of myocardium (CMS/HCC) ACUTE ISCHEMIC HEART DISEASE   . Tachycardia TACHYCARDIA   . Pleural effusion PLEURAL EFFUSION   . Obesity OBESITY   . Acute respiratory failure with hypoxia (CMS/HCC) ACUTE RESPIRATORY FAILURE   . Hypotension LOW BLOOD PRESSURE   . Iron deficiency anemia IRON DEFICIENCY ANEMIA   . Thrombocytosis (CMS/HCC) THROMBOCYTOSIS   . Weight loss WEIGHT DECREASED   . Family history of cancer FAMILY HISTORY OF CANCER   . Cardiomegaly CARDIOMEGALY   . Mediastinal lymphadenopathy MEDIASTINAL LYMPHADENOPATHY   . Hilar lymphadenopathy- bilataeral  HILAR LYMPHADENOPATHY   . Chest pain CHEST PAIN   . Dyspnea and respiratory abnormality DYSPNEA   . Abnormal EKG ELECTROCARDIOGRAM ABNORMAL   . Opacity of lung on imaging study ABNORMAL FINDINGS ON DIAGNOSTIC IMAGING OF LUNG   . Liver lesion- small hypodense lesion around falciform lig LESION OF LIVER   . Aortic stenosis, severe AORTIC VALVE STENOSIS   . Acute systolic heart failure (CMS/HCC) ACUTE SYSTOLIC HEART FAILURE   . Non-ischemic cardiomyopathy (CMS/HCC) CARDIOMYOPATHY   . Respiratory failure (CMS/HCC) RESPIRATORY FAILURE   . S/P AVR (aortic valve replacement) HISTORY OF AORTIC VALVE REPLACEMENT   . 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 Acute renal failure with tubular necrosis (CMS/HCC)  ACUTE RENAL FAILURE SYNDROME   . Aortic valve disorder AORTIC VALVE DISORDER   . Impaired gait and mobility ABNORMAL GAIT   . Impaired activities of daily living ACTIVITY OF DAILY LIVING (ADL) ALTERATION   . Peripheral edema PERIPHERAL EDEMA   . 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 Blood loss anemia ANEMIA DUE TO BLOOD LOSS   . Hypokalemia HYPOKALEMIA     Bathing:  Tub transfer bench         Therapist name:   Marilynn Latino, OT    I have examined the patient and concur with the above recommendations of the therapist.    Authorized and electronically signed.        Physician name:  Eugenio Hoes. Lynnea Ferrier, DO ; NPI #: [id number]

## 2016-02-23 NOTE — Discharge Planning (AHS/AVS) (Addendum)
Returned info to [employer] via email.  Regarding Life Vest, DC 9/16.  Will wear and no other concerns at this time.    4UJ and TTB orders to [employer].

## 2016-02-23 NOTE — Treatment Summary (Signed)
[facility] OT TREATMENT NOTE    [name] / [id number]/2303-01 DOB:  06-05-1956 / 60 y.o. Date:  02/23/2016     Precautions  Specific mobility precautions: Sternal precautions  Sternal Precautions: Standard     ASSESSMENT     Summary: Morning OT: Pt. Demonstrates appropriate safety awareness with some cues for locking her brakes on 4ww with light meal activity.  Pt. Demonstrated safe use of stove and maintained sternal precautions to obtain items to boil an egg and place it safely in a bowl.  She benefits from cues to take rest periods and tends to want to push it. Pt. Was made independent for mobility in her room today by this therapist. RN/CNA notified.  Second Session OT:  Spouse was not present for planned family training, sister was there and was involved in training.  Pt. Demonstrated safe shower transfer with TTB but currently would need some assistance to lift her leg over the tub lip in<>out.  Pt./family also educated to how to cut shower liner to keep water off the floor outside of tub.  Pt. Demonstrates min. Assist with use of compression stocking aid to don stockinette for LB compression foot to below knee.  Cont. OT to progress goals.     Activity Tolerance:     progressing.   Safety Awareness:      distant supervision.     PLAN     Treatment Plan Frequency   .  Bernardine will benefit from  . Intensity/Frequency: 90 minutes daily, 5x/week     [name] will remain on the Occupational Therapy schedule until goals are met, or there has been a change in the Plan of Care.          Treatment Diagnosis Surgery (if applicable)    Primary Dx: Respriatory failure, s/p AVR  See H & P     SUBJECTIVE   Pt. Reports her spouse has a 2.5 hour drive and should be here by now.  Sister in the room and willing to participate in family training and be available as needed.     Pain Level   Hip  5/10     OBJECTIVE   OT treatment focused on safety with self cares within sternal precautions while using A/E and DME for  energy conservation techniques.  Pt/family educated to tub transfers with TTB and safety during showers.  Pt. Verbalized understanding.     Mental status   A & O x 4       TREATMENT  Transfers/Bed Mobility  Transition  Assist Level/Comments   Rolling      --> Sitting up     --> Standing     Pivot Transfer  modified independence with 4ww to bathroom.   --> Sitting down     --> Supine     Scooting up in bed     ADLs/Transfers     ADL   Assist Level/Comments   Eating (Meal prep, self feeding)     Grooming     Upper Body Dressing     Lower Body Dressing     Bathing     Toileting  modified independence.   Statistician  modified independence with use of 4ww   Tub or Doctor, general practice   Pt/sister educated to safe shower transfers and use of TTB for DME.    Safety/Room set-up    safety awareness     GOALS  Patient/Caregiver goals reviewed and integrated with rehab treatment plan:     Multidisciplinary Problems (Active)        Problem: ADLs    Goal Priority Disciplines Outcome   ADL Activity Tolerance for Self Care     OT    Description:  Pt  to tolerate 10  minutes of self care activity in standing position    ADL Grooming     OT    Description:  Patient to be able to perform grooming with mod I assist.    ADL Toilet Transfers     OT    Description:  Patient to be able to perform toilet transfers with modified independence..     ADL Tub Transfers     OT    Description:  Patient to be able to perform tub/shower transfers with SBA.    ADL Upper Body Bathing     OT    Description:  Patient to be able to perform upper body bathing with mod I assist.    ADL Lower Body Bathing     OT    Description:  Patient to be able to perform lower body bathing with stand by assist.      ADL Upper Body Dressing     OT    Description:  Patient to be able to perform upper body dressing with mod I assist.    ADL Lower Body Dressing     OT    Description:  Patient to be able to perform lower body dressing with  modified independence. .     ADL Toileting     OT    Description:  Patient to be able to perform toilet hygiene and clothes management with modified independence.     Caregiver     OT    Description:  Caregiver will demonstrate the ability to assist with patient's care, as needed.           Problem: IP Post Cardiac Goal List    Goal Priority Disciplines Outcome   Precautions     OT    Description:  Patient to follow sternal  precautions, while performing ADLs.           Problem: Safety    Goal Priority Disciplines Outcome   Safety DME     OT    Description:  Pt/Caregiver to demonstrate awareness of DME needs for safety upon discharge.                  First session Second session (if applicable)    Start/Stop times:   -  08:30-9:15 Start/Stop times:   - Stop Time:  1145-12:30   Total time:   minutes 45 Total time:   minutes45     Therapist:  Nikiah Goin, OT

## 2016-02-23 NOTE — Plan of Care (Signed)
End of Shift Report     Significant Events EDU provided on CHF - daily wts. & tracking, diet, contacting MD post D/C, zones, etc; discussed life vest     Relevant   Assessment Findings VSS, betadine on right index finger, right and left toes d/t acrocyanosis; sternal incision healed, independent w/ ambulation in room - 4WW,         Patient/Family  Questions/Concerns Pt feels as though it may be difficult to adhere to a heart healthy diet, staff to continue education.     DC Barriers Generalized weakness     Hourly Rounding;  ??  The health care team, RN, Therapy team, CNAll team rounded per policy;  ??  This ensured a high level of quality, safety checks, and satisfaction.   ??  This ensured patient expectations for excellent care, appropriate goals were set and assessed then updated.  ??  Purposeful Rounding included; Medication Pass and education, Pain assessment, Potty check, Position/Comfort check, Meal - food and diet check, General Safety of patient/room/bed/rest room/environmental, Plan of Care to include labs,tests,discharge plan and follow up.  RELATE -- Reassure, Explain, Listen, Answer, Take Action, Express appreciation.

## 2016-02-24 LAB — CBC WITH AUTO DIFFERENTIAL
Bands %: 2 % (ref 0–10)
Bands, Absolute: 0.1 10*3/ÂµL (ref 0.0–1.2)
Basophils %: 0 % (ref 0–2)
Basophils, Absolute: 0 10*3/ÂµL (ref 0.0–0.2)
Eosinophils %: 2 % (ref 0–7)
Eosinophils, Absolute: 0.1 10*3/ÂµL (ref 0.0–0.7)
HCT: 31.8 % — ABNORMAL LOW (ref 37.0–48.0)
Hemoglobin: 10.3 g/dL — ABNORMAL LOW (ref 12.0–16.0)
Lymphocytes %: 21 % — ABNORMAL LOW (ref 25–45)
Lymphocytes, Absolute: 1.3 10*3/ÂµL (ref 1.1–4.3)
MCH: 26.8 pg — ABNORMAL LOW (ref 27.0–34.0)
MCHC: 32.3 g/dL (ref 32.0–36.0)
MCV: 83.2 fL (ref 81.0–99.0)
MPV: 7.9 fL (ref 7.4–10.4)
Monocytes %: 4 % (ref 0–12)
Monocytes, Absolute: 0.2 10*3/ÂµL (ref 0.0–1.2)
Neutrophils %: 71 % — ABNORMAL HIGH (ref 35–70)
Neutrophils, Absolute: 4.3 10*3/ÂµL (ref 1.6–7.3)
Platelet Count: 256 10*3/ÂµL (ref 150–400)
Platelet Estimate: NORMAL
RBC: 3.82 10*6/ÂµL — ABNORMAL LOW (ref 4.20–5.40)
RDW: 29.1 % — ABNORMAL HIGH (ref 11.5–14.5)
WBC: 6 10*3/ÂµL (ref 4.8–10.8)

## 2016-02-24 LAB — BASIC METABOLIC PANEL
Anion Gap: 7 mmol/L (ref 3.0–11.0)
BUN: 9 mg/dL (ref 6–23)
CO2 - Carbon Dioxide: 24 mmol/L (ref 21.0–31.0)
Calcium: 10 mg/dL (ref 8.6–10.3)
Chloride: 109 mmol/L (ref 98–111)
Creatinine: 0.8 mg/dL (ref 0.55–1.10)
GFR Estimate: >60 mL/min/{1.73_m2} (ref >=60)
Glucose: 92 mg/dL (ref 80–99)
Potassium: 3.7 mmol/L (ref 3.5–5.1)
Sodium: 140 mmol/L (ref 135–143)

## 2016-02-24 LAB — PROTIME-INR
INR: 2 — ABNORMAL HIGH (ref 0.9–1.2)
Protime: 22.1 Seconds — ABNORMAL HIGH (ref 10.0–13.0)

## 2016-02-24 MED ORDER — nitroglycerin (NITROSTAT) 2 % ointment 0.1 g
2 | Freq: Three times a day (TID) | TRANSDERMAL | Status: DC
Start: 2016-02-24 — End: 2016-02-26
  Administered 2016-02-24 – 2016-02-26 (×6): 2 [in_us] via TOPICAL

## 2016-02-24 MED FILL — NITRO-BID 2 % TRANSDERMAL OINTMENT: 2 % | TRANSDERMAL | Qty: 1

## 2016-02-24 MED FILL — AMIODARONE 200 MG TABLET: 200 mg | ORAL | Qty: 1

## 2016-02-24 MED FILL — FAMOTIDINE 20 MG TABLET: 20 mg | ORAL | Qty: 1

## 2016-02-24 MED FILL — CARVEDILOL 3.125 MG TABLET: 3.125 mg | ORAL | Qty: 2

## 2016-02-24 MED FILL — KLOR-CON M10 MEQ TABLET,EXTENDED RELEASE: 10 meq | ORAL | Qty: 2

## 2016-02-24 MED FILL — ASPIRIN 81 MG TABLET,ENTERIC COATED: 81 mg | ORAL | Qty: 1

## 2016-02-24 MED FILL — COUMADIN 5 MG TABLET: 5 mg | ORAL | Qty: 1

## 2016-02-24 MED FILL — ATORVASTATIN 20 MG TABLET: 20 mg | ORAL | Qty: 1

## 2016-02-24 MED FILL — ACETAMINOPHEN 325 MG TABLET: 325 mg | ORAL | Qty: 2

## 2016-02-24 MED FILL — FONDAPARINUX 10 MG/0.8 ML SUBCUTANEOUS SOLUTION SYRINGE: 10 | SUBCUTANEOUS | Qty: 0.8

## 2016-02-24 MED FILL — FUROSEMIDE 40 MG TABLET: 40 mg | ORAL | Qty: 1

## 2016-02-24 NOTE — Progress Notes (Signed)
Talked to the patient about getting her Flu shot and she is thinking about it. She feels that the nitro paste has helped her appendages.

## 2016-02-24 NOTE — Discharge Planning (AHS/AVS) (Signed)
Charmayne's friend will pick up the $ww and TTB on Friday at [facility].    Genise extremely worried about what would happen if she has an emergency.  Her sister Baird Lyons is here and she is very involoved in [facility].    We placed Casey's contact info in [facility] phone with the header ICE  has Med List  We put Avalyn's allergies and current med list in note section of her phone, e mailed it to [place] who will put it in hers and Mr Bleich's phone.

## 2016-02-24 NOTE — Progress Notes (Signed)
PMR Physician Daily Note     General and Subjective:   Feels very well      Observations and Exam:  Vital Sign Ranges for Last 24 Hours:  BP  Min: 105/54  Max: 136/75  Temp  Min: 36.9 ?C (98.5 ?F)  Max: 37 ?C (98.6 ?F)  Pulse  Min: 81  Max: 89  Resp  Min: 18  Max: 18  SpO2  Min: 94 %  Max: 98 %    Alert, no distress  Heart is RRR  Lungs are clear  Hands and feet inspected        Recent Glucose Monitoring  Lab Results   Component Value Date    POCGLU 111 (H) 02/15/2016    POCGLU 118 (H) 02/10/2016    POCGLU 91 02/10/2016    POCGLU 162 (H) 02/10/2016    POCGLU 102 (H) 02/10/2016           Intake/Output Summary (Last 24 hours) at 02/24/16 1251  Last data filed at 02/24/16 0900   Gross per 24 hour   Intake             1642 ml   Output             1350 ml   Net              292 ml       Recent Labs  Results for orders placed or performed during the hospital encounter of 02/17/16 (from the past 24 hour(s))   Protime Panel -Now & Q AM    Collection Time: 02/24/16  6:26 AM   Result Value Ref Range    Protime 22.1 (H) 10.0 - 13.0 Seconds    INR 2.0 (H) 0.9 - 1.2   CBC with Auto Differential -Every Monday & Thursday    Collection Time: 02/24/16  6:26 AM   Result Value Ref Range    WBC 6.0 4.8 - 10.8 10*3/?L    RBC 3.82 (L) 4.20 - 5.40 10*6/?L    Hemoglobin 10.3 (L) 12.0 - 16.0 g/dL    HCT 13.0 (L) 86.5 - 48.0 %    MCV 83.2 81.0 - 99.0 fL    MCH 26.8 (L) 27.0 - 34.0 pg    MCHC 32.3 32.0 - 36.0 g/dL    RDW 78.4 (H) 69.6 - 14.5 %    Platelet Count 256 150 - 400 10*3/?L    MPV 7.9 7.4 - 10.4 fL    Neutrophils % 71 (H) 35 - 70 %    Bands % 2 0 - 10 %    Lymphocytes % 21 (L) 25 - 45 %    Monocytes % 4 0 - 12 %    Eosinophils % 2 0 - 7 %    Basophils % 0 0 - 2 %    Neutrophils, Absolute 4.3 1.6 - 7.3 10*3/?L    Bands, Absolute 0.1 0.0 - 1.2 10*3/?L    Lymphocytes, Absolute 1.3 1.1 - 4.3 10*3/?L    Monocytes, Absolute 0.2 0.0 - 1.2 10*3/?L    Eosinophils, Absolute 0.1 0.0 - 0.7 10*3/?L    Basophils, Absolute 0.0 0.0 - 0.2 10*3/?L      Differential Type Manual Differential     Platelet Estimate Normal Normal    Anisocytosis 1+ (A) (none)   Basic Metabolic Panel -Every Monday & Thursday    Collection Time: 02/24/16  6:26 AM   Result Value Ref Range    Sodium 140 135 -  143 mmol/L    Potassium 3.7 3.5 - 5.1 mmol/L    Chloride 109 98 - 111 mmol/L    CO2 - Carbon Dioxide 24.0 21.0 - 31.0 mmol/L    Glucose 92 80 - 99 mg/dL    BUN 9 6 - 23 mg/dL    Creatinine 5.18 8.41 - 1.10 mg/dL    Calcium 66.0 8.6 - 63.0 mg/dL    Anion Gap 7.0 3.0 - 11.0 mmol/L    GFR Estimate >60 >=60 mL/min/1.37m*2    GFR Additional Info         [x]     I have reviewed the Rehabilitation  Interdisciplinary Notes.   Medical/Function Problems:   Patient Active Problem List    Diagnosis SNOMED CT(R) Date Noted   . Aortic valve disorder AORTIC VALVE DISORDER 02/17/2016   . Impaired gait and mobility ABNORMAL GAIT 02/17/2016   . Impaired activities of daily living ACTIVITY OF DAILY LIVING (ADL) ALTERATION 02/17/2016   . Peripheral edema PERIPHERAL EDEMA 02/17/2016   . Ischemia of digits of hands and feet UPPER LIMB ISCHEMIA 02/17/2016   . Blood loss anemia ANEMIA DUE TO BLOOD LOSS 02/17/2016   . Hypokalemia HYPOKALEMIA 02/17/2016   . Acute renal failure with tubular necrosis (CMS/HCC) ACUTE RENAL FAILURE SYNDROME 02/13/2016   . Heparin induced thrombocytopenia (CMS/HCC) HEPARIN-INDUCED THROMBOCYTOPENIA 02/02/2016   . S/P AVR (aortic valve replacement) HISTORY OF AORTIC VALVE REPLACEMENT 01/27/2016   . Non-ischemic cardiomyopathy (CMS/HCC) CARDIOMYOPATHY 01/23/2016   . Respiratory failure (CMS/HCC) RESPIRATORY FAILURE 01/23/2016   . Aortic stenosis, severe AORTIC VALVE STENOSIS 01/22/2016   . Acute systolic heart failure (CMS/HCC) ACUTE SYSTOLIC HEART FAILURE 01/22/2016   . Heart failure (CMS/HCC) HEART FAILURE 01/21/2016   . Elevated brain natriuretic peptide (BNP) level HORMONE LEVEL - FINDING 01/21/2016   . NSTEMI (non-ST elevated myocardial infarction) (CMS/HCC) ACUTE NON-ST  SEGMENT ELEVATION MYOCARDIAL INFARCTION 01/21/2016   . Demand ischemia of myocardium (CMS/HCC) ACUTE ISCHEMIC HEART DISEASE 01/21/2016   . Tachycardia TACHYCARDIA 01/21/2016   . Pleural effusion PLEURAL EFFUSION 01/21/2016   . Obesity OBESITY 01/21/2016   . Acute respiratory failure with hypoxia (CMS/HCC) ACUTE RESPIRATORY FAILURE 01/21/2016   . Hypotension LOW BLOOD PRESSURE 01/21/2016   . Iron deficiency anemia IRON DEFICIENCY ANEMIA 01/21/2016   . Thrombocytosis (CMS/HCC) THROMBOCYTOSIS 01/21/2016   . Weight loss WEIGHT DECREASED 01/21/2016   . Family history of cancer FAMILY HISTORY OF CANCER 01/21/2016   . Cardiomegaly CARDIOMEGALY 01/21/2016   . Mediastinal lymphadenopathy MEDIASTINAL LYMPHADENOPATHY 01/21/2016   . Hilar lymphadenopathy- bilataeral  HILAR LYMPHADENOPATHY 01/21/2016   . Chest pain CHEST PAIN 01/21/2016   . Dyspnea and respiratory abnormality DYSPNEA 01/21/2016   . Abnormal EKG ELECTROCARDIOGRAM ABNORMAL 01/21/2016   . Opacity of lung on imaging study ABNORMAL FINDINGS ON DIAGNOSTIC IMAGING OF LUNG 01/21/2016   . Liver lesion- small hypodense lesion around falciform lig LESION OF LIVER 01/21/2016       Impression and Plan:    1.  INR therapeutic, will DC arixtra but continue to monitor  2.  Other labs looking well  3.  Ischemic digits:  Will try small amount nitro ointment to ischemic areas to try to improve blood flow, guard against hypotension.  Discussed with patient who is in agreement.         [x]     Patient remains appropriate for inpatient rehabilitation facility level of care, continue present therapies and medical treatment and monitoring.    []     See orders for any treatment  modifications     Hansel Feinstein  02/24/2016 12:51 PM

## 2016-02-24 NOTE — Progress Notes (Addendum)
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 admitted on 02/17/2016 with a NSTEMI s/p R&L heart catheterization. PICC line placed 8/25. CRRT from 8/20-8/25. 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     Allergies: Heparin and Sulfa (sulfonamide antibiotics)  PMH:  has a past medical history of History of blood transfusion.   Active problems:   ? Heparin induced thrombocytopenia  ? S/P Aortic valve replacement     OBJECTIVE:     Height:   Admit weight: 102.1 kg (225 lb 1.4 oz) Last charted weight: 89.9 kg (198 lb 3.1 oz) IBW:   kg Body mass index is 31.99 kg/(m^2).    Recent Labs      02/24/16   0626   NA  140   K  3.7   CL  109   CO2  24.0   GLU  92   BUN  9   CREATININES  0.80   CALCIUM  10.0     Recent Labs      02/24/16   0626   WHITEBLOODCE  6.0   NEUTROABS  4.3   BANDSABS  0.1   HGB  10.3*   HCT  31.8*   LABPLAT  256     Warfarin Dosing Record:  Date: 8/31 9/1 9/2 9/3 9/4 9/5 9/6 9/7 9/8 9/9 9/10 9/11   INR: 2.9 3 4.1 2.8 3.2 4.0 4.2 @ 0517  3.6 @ 1055  3.8 @ 1530 2.8 3.1 2 1.2 1.8   Warfarin: 3 mg 3 mg 2.5 3 mg 5 mg 5 mg Hold 3mg  Hold 3 mg 3 mg 3 mg     Date: 9/12 9/13 9/14            INR: 1.9 1.8 2.0            Warfarin: 4 mg 5 mg                 ASSESSMENT:     Recent vitamin K administration: None noted in Epic   Warfarin Indication:  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 Amiodarone (currently being tapered down)   Warfarin education status: Completed on 02/16/16     PLAN:     Warfarin:  ? Continue warfarin to 5 mg daily.   ? Since pt is at risk for thrombosis with recent HIT diagnosis, MD  agreed to add on fondaparinux 10 mg SQ daily (wt >100 kg) until INR >2 for 48 h. No renal adjustments necessary at this time.   ? Daily INR/PT ordered  ? Pharmacy to monitor and adjust dosing as needed    Signed:  Comfort Iversen L. Cap[facility] Veals, [facility]

## 2016-02-24 NOTE — Treatment Summary (Signed)
[facility] PT TREATMENT NOTE    [name] / 2303/2303-01 DOB:  August 28, 1955 / 60 y.o. Date:  02/24/2016     Precautions  Specific mobility precautions: Sternal precautions  Sternal precautions: Standard  Required Braces/Orthotics: None  Other: Very sore (R) 3rd toe; Ejection fraction estimated to be 30-35%     ASSESSMENT     Summary:  Pt has made excellent progress in therapy. Pt tolerating ambulating to/from room and gym with PT and OT. Currently mod indep/SBA for mobility, amb with 4ww. Cont to have decreased balance but is improving daily. CG training with sister for gait, transfers and step negotiation. All performed safety and pt well able to direct her care. HEP reveiwed and will issue handout for pt to use when dc as she will not have any therapy once she is discharged. Very swee,t pleasant, and cooperative, works very hard.     Activity tolerance: Tolerates 30 min activity with multiple rests  Comments: Improving daily     Precaution Awareness: Fully aware of precaution(s)  Deficit Awareness: Fully aware of deficits  Correction of Errors: Self-corrects with cues  Safety Judgment: Good awareness of safety     DME Needs:   DME Needs: 1OX     PLAN     Intensity/Frequency:  Duration:   Intensity/frequency: 90 minutes daily, 5x/week  14 days      Continue intensive level of Physical therapy to address progressive mobility training, caregiver training and DME needs.          Treatment Diagnosis Surgery (if applicable)   Primary Dx: Acute cardio-pulmonary failure; severe aortic stenosis which required aortic valve replacement  Treatment Dx: Impaired mobility  Procedure/Date: 01/27/2016: Aortic valve replacement       SUBJECTIVE   I am ready to go home. I am worried about the long drive from my house to my doctors appts     Pain Level  Current status: Pain does not limit pt's ability to participate in therapy  Pain at start of session: 0 - No Pain  Pain at end of session: 4  Pain location: Hip  Pain  descriptors: Aching     OBJECTIVE   CG training with sister: gait, transfers and step with 4ww. HEP with standing ex's BLE.   Biodex x 10 min       Mental status:   General Demeanor: Pleasant, Cooperative  LOC: Alert  Orientation: Oriented x 4  Oriented to: Person, Place, Situation  Directions: Follows multi-step commands  --> Follows multi-step commands: Consistently  --> Follows one-step commands: Consistently  Attention span: Appears intact  Memory: Appears intact in social/therapy situations   Medical appliances:   Medical Appliances  Medical Appliances: None         TREATMENT    Bed Mobility/Transfers  Bed mobility       Rolling / logroll  Roll L: Modified Independent      Transition to sitting up Sidelying to Sit: Modified Independent   To resting position      Transition to sitting down Modified Independent     Transition to supine Modified Independent     Scooting Distant Supervision;Verbal cues   Transfers      Transition to standing From bed: Modified Independent  From wheelchair: Modified Independent  From mat: Modified Independent     Method Stand pivot: Distant Supervision          Gait Jackie Plum training:   Mobility - Gait (Level)  Gait (distance): 180  feet (x2 in am and again in pm)  Gait (assist level): SBA  Assistive device: Front wheeled walker (FWW)  Endurance: fair+  Pace: cued to slow at times  Pattern: Wide BOS;Decreased foot clearance L;Decreased foot clearance R;Decreased step length L;Decreased step length R  Surface: Level tile  Mobility - Gait (Stairs/curbs)  Number of Stairs: 5x4  Step height: 6  Curb height: 4  Pattern: Step to  Assist level: SBA  Assistive device: Front wheeled walker (FWW);Hand railing bilaterally (4ww for curb)      Ther. Ex.:   LE exercises: Seated long arc quads;Standing hip abd/adduction;Standing hip flexion;Standing knee flexion;Standing mini squats  Ankle pumps: x20  Seated long arc quads: 10x2  Standing hip abd/adduction: 10x2  Standing hip flexion:  10x2  Standing knee flexion: 10x2  Mini squats: 10x2  Addit. Exercises: Biodex  Biodex recumbent elliptical machine x 10 minutes, level 1 resistance, flat profile, bilateral UE for interlimb coordination and conditioning.   Balance training:   Balance - Basic  Static sitting balance: Assist level  Assist level: Modified Independence  Position: Feet - supported  Static standing balance: Assist level  Assist level: SBA  Dynamic standing balance: Assist level  Assist level with weight shifts: SBA   Standardized Tests:         Training/Education:  Trainee(s): Primary Learner;Co-Learner  Primary Learner: Patient  Primary Learner - Barriers to Learning?: No  Co-learner: Family member (sister)  Co-Learner - Barriers to Learning?: No  Training provided: GAit, transfers, step and curb negotiation  Response to training: Sister able to safely assist pt   Safety/Room set-up:     Call button accessible?: Yes  Phone accessible?: Yes  Oxygen reconnected?: No, on room air  Patient mobility in room: Able to be up with Nursing Team (x 1).  Position on arrival: In bed  Position on departure (bed): 2 bedrails up  Comments: Returned to supine, call light in reach     GOALS     Patient/Caregiver goals reviewed and integrated with rehab treatment plan:     Multidisciplinary Problems (Active)        Problem: Caregiver Training    Goal Priority Disciplines Outcome   Caregivers to demonstrate safe patient assist, as needed     PT           Problem: DME    Goal Priority Disciplines Outcome   Durable Medical Equipment needs to be met     PT           Problem: Mobility: Ambulation    Goal Priority Disciplines Outcome   Gait (with precautions)     PT    Description:  Pt. to ambulate 150 feet at modified independent level, following sternal precautions, using 4WW.    Stairs (with precautions)     PT    Description:  Pt. to ascend/descend 6 stairs with SBA, using (B) rails, following sternal precautions.            Problem: Mobility: Transfers     Goal Priority Disciplines Outcome   Supine to/from sitting (with precautions)     PT    Description:  Pt. to transfer to/from sitting at modified independent level, following sternal precautions.    Bed to/from chair (with precautions)     PT    Description:  Pt. to transfer bed to/from chair at modified independent level, following sternal precautions.           Problem: Patient/Family Goal    Goal  Priority Disciplines Outcome   Home     PT    Description:  Pt. wants to return to previous living situation.           Problem: Precautions    Goal Priority Disciplines Outcome   Application of Precautions     PT    Description:  Pt. to verbally recall and follow all pertinent precautions, including sternal precautions.                  First session Second session (if applicable)   Start/Stop times: 0945 - 1030 Start/Stop times: 1400 - 1445   Total time: 45 minutes  Total time: 45 minutes      Therapist:  Penelope Coop, PT

## 2016-02-24 NOTE — Other (Signed)
Please use the Life Vest as instructed.

## 2016-02-24 NOTE — Discharge Planning (AHS/AVS) (Signed)
E Mailed Rob Veterinary surgeon.  In regards to the [employer].  Please call the Cardiac Nurse Navigator to confirm need with Dr Truddie Hidden or Debroah Baller.

## 2016-02-24 NOTE — Progress Notes (Signed)
Pt is managing her independence in room without problems. Noted that pt was unable to return to sleep after using the bathroom tonight. She denied any pain or discomfort. Pt states her eagerness to go home on Saturday. [place] tea offered and was able to fall back to sleep.

## 2016-02-24 NOTE — Treatment Summary (Signed)
[facility] OT TREATMENT NOTE    [name] / [id number] DOB:  1956-01-02 / 60 y.o. Date:  02/24/2016     Precautions  Specific mobility precautions: Sternal precautions  Sternal Precautions: Standard     ASSESSMENT     Summary:  Pt. Demonstrates modified independence including obtaining all items about her room for UB/LB dressing (min. Assist only to don B. LE compression sleeves).  Pt. Was provided another set of LE compression sleeves to switch out as needed.  Pt. Demonstrated modified independence with in room mobility with 4ww with minimal cues to lock her 4ww prior to reaching for items.  Modified independence with oral hygiene while standing at the sink.  Second OT session: Reviewed TTB and tub transfers with pt. And sister provided leg lifter for modified independent tub transfers.  Reviewed pt.'s DME and plan for meals at home. Pt. Reports she will perform seated cooking and demonstrates understanding of need for rest breaks.     Activity Tolerance:    Activity tolerance: Tolerates 30 min activity with multiple rests   Safety Awareness:     Precaution Awareness: Fully aware of precaution(s)  Safety Judgment: Good awareness of safety     PLAN     Treatment Plan Frequency   .  Lataisha will benefit from Treatment Plan: Continue per established POC.  Pt. demonstrates limitations which require skilled OT intervention.. Intensity/Frequency: 90 minutes daily, 5x/week     [name] will remain on the Occupational Therapy schedule until goals are met, or there has been a change in the Plan of Care.          Treatment Diagnosis Surgery (if applicable)    Primary Dx: Respiratory failure, s/p AVR  See H & P     SUBJECTIVE   Pt. Agreeable to OT treatment.     Pain Level  Current status: Pain does not limit pt's ability to participate in therapy     OBJECTIVE   OT treatment focus on safety and appropriate compliance with sternal precautions and energy conservation techniques for self dressing and oral  hygiene.  Provided a second set of LE compression sleeves for hygiene.       Mental status  General Demeanor: Pleasant;Cooperative  LOC: Alert  Orientation: Oriented x 4  --> Follows one-step commands: Consistently  Attention span: Appears intact  Awareness of errors: Good awareness of errors  Safety judgment: Good awareness of safety precautions       TREATMENT  Transfers/Bed Mobility  Transition  Assist Level/Comments   Rolling      --> Sitting up     --> Standing From bed: Distant Supervision   Pivot Transfer     --> Sitting down     --> Supine     Scooting up in bed     ADLs/Transfers     ADL   Assist Level/Comments   Eating (Meal prep, self feeding)     Grooming Modified Independence   Upper Body Dressing Modified Independence   Lower Body Dressing Modified Independence (with use of A/E(except min. assist for compression sleeveLEs)   Bathing     Toileting  Modified independence   Toilet Transfer  Modified independence   Tub or Shower Transfer  Modified independence with use of leg lifter provided.   Where assessed: Standing at the sink;Edge of bed    Training/Education  Trainee(s): Primary Learner  Primary Learner: Patient  Primary Learner - Barriers to Learning?: No  Training provided: ADL's  within precautions w/ energy conservation techniques  Response to training: demonstrates understanding with 1-2 cues. (in 45 min. self cares OT treatment)   Safety/Room set-up   Call button accessible?: Yes  Phone accessible?: Yes  Position on departure (bed): Standard bed  Position on departure (upright): In wheelchair     GOALS     Patient/Caregiver goals reviewed and integrated with rehab treatment plan:     Multidisciplinary Problems (Active)        Problem: ADLs    Goal Priority Disciplines Outcome   ADL Activity Tolerance for Self Care     OT    Description:  Pt  to tolerate 10  minutes of self care activity in standing position    ADL Grooming     OT    Description:  Patient to be able to perform grooming with mod I  assist.    ADL Toilet Transfers     OT    Description:  Patient to be able to perform toilet transfers with modified independence..     ADL Tub Transfers     OT    Description:  Patient to be able to perform tub/shower transfers with SBA.    ADL Upper Body Bathing     OT    Description:  Patient to be able to perform upper body bathing with mod I assist.    ADL Lower Body Bathing     OT    Description:  Patient to be able to perform lower body bathing with stand by assist.      ADL Upper Body Dressing     OT    Description:  Patient to be able to perform upper body dressing with mod I assist.    ADL Lower Body Dressing     OT    Description:  Patient to be able to perform lower body dressing with modified independence. .     ADL Toileting     OT    Description:  Patient to be able to perform toilet hygiene and clothes management with modified independence.     Caregiver     OT    Description:  Caregiver will demonstrate the ability to assist with patient's care, as needed.           Problem: IP Post Cardiac Goal List    Goal Priority Disciplines Outcome   Precautions     OT    Description:  Patient to follow sternal  precautions, while performing ADLs.           Problem: Safety    Goal Priority Disciplines Outcome   Safety DME     OT    Description:  Pt/Caregiver to demonstrate awareness of DME needs for safety upon discharge.                  First session Second session (if applicable)    Start/Stop times: 0830 - 0915 Start/Stop times:   - Stop Time:  11:45-12:30   Total time: 45 minutes  Total time:   minutes45     Therapist:  Jeidy Hoerner, OT

## 2016-02-24 NOTE — Progress Notes (Signed)
Nutrition Follow-Up    Brandy [name] is a 60 y.o. female, DOB 11-Oct-1955    Subjective:  Recent intake values reflect adequate oral intake of: 50-100% meals. Pt reports a good appetite, tolerating current diet. Had some questions on discharge diet. Very appreciative of care.     Admit Anthropometric Measurements:   Ht:          Admit Wt: 101.4 kg (224 lbs)  IBW: 59.09 kg (130 lbs)   %IBW: 172%  BMI: 36.1 (no edema)   Ht/wt status:obese class 2     Today's Weight:  89.9 kg (198 lb 3.1 oz) Body mass index is 31.99 kg/(m^2).              Nutrition-Focused Physical Findings:  Patient appears well-nourished and well-hydrated     Poor dentition per physician note   Nutrition risk factors: Current morbidity   Risk for nutritional deficit:moderate     Food and Nutritional Intake:  Diet Orders:   Procedures   . Diet Regular; No - patient to receive automatic trays     PO Intake is adequate    Other Allergies: Heparin and Sulfa (sulfonamide antibiotics)     Client History:  Admission Diagnosis: weakness    Diagnosis:   No diagnosis found.  Usual meds include: Reviewed   Current meds include: Pepcid, lasix, insulin, zofran, potassium chloride    Biochemical Data, Medical Tests and Procedures:  Lab Results   Component Value Date    NA 140 02/24/2016    K 3.7 02/24/2016    CL 109 02/24/2016    CO2 24.0 02/24/2016    GLU 92 02/24/2016    BUN 9 02/24/2016    CREATININES 0.80 02/24/2016    LABCREA 77 01/28/2016    MG 1.8 02/10/2016    PHOS 1.8 (L) 02/17/2016   Phos is BNL - not replaced or rechecked    Nutrition Diagnosis (Problem, Etiology, Signs and Symptoms)  Inadequate protein-energy intake related to poor appetite as evidenced by PO intake of 0-50% of meals and pt report of poor appetite.   (Progress:  Progressing.)    Nutrition Intervention:   - Continue current diet per SLP; will follow for PO adequacy.    Monitoring and Evaluation:   Will continue to follow and monitor PO intake and labs per nutrition protocol.  PO goal:  >70% of meals/supplements.     Follow Up: 4-8 days    Laqueta Due  02/24/2016  2:57 PM  [facility]

## 2016-02-25 LAB — PROTIME-INR
INR: 2.8 — ABNORMAL HIGH (ref 0.9–1.2)
Protime: 31.3 Seconds — ABNORMAL HIGH (ref 10.0–13.0)

## 2016-02-25 MED ORDER — furosemide (LASIX) 40 MG tablet
40 | ORAL_TABLET | Freq: Every day | ORAL | 0 refills | Status: DC
Start: 2016-02-25 — End: 2016-03-12

## 2016-02-25 MED ORDER — warfarin (COUMADIN) tablet 3 mg
2 | Freq: Every day | ORAL | Status: DC
Start: 2016-02-25 — End: 2016-02-26
  Administered 2016-02-26: 1 mg via ORAL

## 2016-02-25 MED ORDER — potassium chloride SA (K-DUR,KLOR-CON) 20 MEQ tablet
20 | ORAL_TABLET | Freq: Every day | ORAL | 0 refills | 30.00000 days | Status: DC
Start: 2016-02-25 — End: 2016-03-12

## 2016-02-25 MED ORDER — carvedilol (COREG) 6.25 MG tablet
6.25 | ORAL_TABLET | Freq: Two times a day (BID) | ORAL | 1 refills | 90.00000 days | Status: DC
Start: 2016-02-25 — End: 2016-03-29

## 2016-02-25 MED ORDER — warfarin (COUMADIN) 5 MG tablet
5 | ORAL_TABLET | Freq: Every day | ORAL | 1 refills | 62.00000 days | Status: DC
Start: 2016-02-25 — End: 2016-02-26

## 2016-02-25 MED ORDER — nitroglycerin (NITROSTAT) 2 % ointment
2 | PACK | Freq: Three times a day (TID) | TRANSDERMAL | 0 refills | 10.00000 days | Status: DC
Start: 2016-02-25 — End: 2016-03-13

## 2016-02-25 MED ORDER — atorvastatin (LIPITOR) 20 MG tablet
20 | ORAL_TABLET | Freq: Every evening | ORAL | 1 refills | 90.00000 days | Status: DC
Start: 2016-02-25 — End: 2016-03-29

## 2016-02-25 MED ORDER — flu vaccine qs (36 mos+)(PF) 2017-2018 (FLUZONE,FLUARIX) syringe 1 Syringe
60 | INTRAMUSCULAR | Status: AC
Start: 2016-02-25 — End: 2016-02-25
  Administered 2016-02-25: 20:00:00 60 via INTRAMUSCULAR

## 2016-02-25 MED ORDER — aspirin 81 MG EC tablet
81 | Freq: Every day | ORAL | Status: DC
Start: 2016-02-25 — End: 2016-05-31

## 2016-02-25 MED FILL — FAMOTIDINE 20 MG TABLET: 20 mg | ORAL | Qty: 1

## 2016-02-25 MED FILL — FLUZONE QUAD 2017-18(PF) 60 MCG(15 MCGX4)/0.5 ML INTRAMUSCULAR SYRINGE: 60 mcg (15 mcg x 4)/0.5 mL | INTRAMUSCULAR | Qty: 1

## 2016-02-25 MED FILL — ASPIRIN 81 MG TABLET,ENTERIC COATED: 81 mg | ORAL | Qty: 1

## 2016-02-25 MED FILL — NITRO-BID 2 % TRANSDERMAL OINTMENT: 2 % | TRANSDERMAL | Qty: 1

## 2016-02-25 MED FILL — ATORVASTATIN 20 MG TABLET: 20 mg | ORAL | Qty: 1

## 2016-02-25 MED FILL — AMIODARONE 200 MG TABLET: 200 mg | ORAL | Qty: 1

## 2016-02-25 MED FILL — FUROSEMIDE 40 MG TABLET: 40 mg | ORAL | Qty: 1

## 2016-02-25 MED FILL — ACETAMINOPHEN 325 MG TABLET: 325 mg | ORAL | Qty: 2

## 2016-02-25 MED FILL — CARVEDILOL 3.125 MG TABLET: 3.125 mg | ORAL | Qty: 2

## 2016-02-25 MED FILL — KLOR-CON M10 MEQ TABLET,EXTENDED RELEASE: 10 meq | ORAL | Qty: 2

## 2016-02-25 NOTE — Plan of Care (Signed)
Patient continues to improve. Mod I in room with 4 wheel walker. Index finger to right hand remains darkened. Toes to feet bilat remain darkened. Patient is A&O x4. Pain is well controlled with Tyl as needed. Restful in room at this time. Kathy Breach, RN.

## 2016-02-25 NOTE — Progress Notes (Signed)
End of Shift Report     Significant Events Uneventful.     Relevant   Assessment Findings Chest surgical incision healed. Black right index finger and cyanotic toes. Nitro paste applied.   Denies pain or other discomfort      Patient/Family  Questions/Concerns Pt's sister, Jonathon Bellows would like to be called when pt is about ready to be discharged. She is providing transportation home for the pt.    Pt would like to know if she can have a friend of hers to help with care giving and get her some compensation. Note left for social worker/DCP     DC Barriers None. DC 9/16

## 2016-02-25 NOTE — Progress Notes (Signed)
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 admitted on 02/17/2016 with a NSTEMI s/p R&L heart catheterization. PICC line placed 8/25. CRRT from 8/20-8/25. 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     Allergies: Heparin and Sulfa (sulfonamide antibiotics)  PMH:  has a past medical history of History of blood transfusion.   Active problems:   ? Heparin induced thrombocytopenia  ? S/P Aortic valve replacement     OBJECTIVE:     Height:   Admit weight: 102.1 kg (225 lb 1.4 oz) Last charted weight: 89.9 kg (198 lb 3.1 oz) IBW:   kg Body mass index is 31.99 kg/(m^2).    Recent Labs      02/24/16   0626   NA  140   K  3.7   CL  109   CO2  24.0   GLU  92   BUN  9   CREATININES  0.80   CALCIUM  10.0     Recent Labs      02/24/16   0626   WHITEBLOODCE  6.0   NEUTROABS  4.3   BANDSABS  0.1   HGB  10.3*   HCT  31.8*   LABPLAT  256     Warfarin Dosing Record:  Date: 8/31 9/1 9/2 9/3 9/4 9/5 9/6 9/7 9/8 9/9 9/10 9/11   INR: 2.9 3 4.1 2.8 3.2 4.0 4.2 @ 0517  3.6 @ 1055  3.8 @ 1530 2.8 3.1 2 1.2 1.8   Warfarin: 3 mg 3 mg 2.5 3 mg 5 mg 5 mg Hold 3mg  Hold 3 mg 3 mg 3 mg     Date: 9/12 9/13 9/14 9/15           INR: 1.9 1.8 2.0 2.8           Warfarin: 4 mg 5 mg 5 mg 3               ASSESSMENT:     Recent vitamin K administration: None noted in Epic   Warfarin Indication:  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 Amiodarone (currently being tapered down)   Warfarin education status: Completed on 02/16/16     PLAN:     Warfarin:  ? Decrease warfarin to 3 mg daily.   ? Daily INR/PT ordered. Pt discharging tomorrow and then first outpatient INR will be  on Monday 9/18.  ? Pharmacy to monitor and adjust dosing as needed    Signed:  Luna Fuse, [facility]

## 2016-02-25 NOTE — Treatment Summary (Signed)
[name] OT DISCHARGE SUMMARY    [name] / 2303/2303-01 DOB:  1955/08/03 / 60 y.o. Date:  02/25/2016     Precautions  Specific mobility precautions: Sternal precautions  Sternal Precautions: Standard     ASSESSMENT     Summary:  Pt. Demonstrates safety awareness and compliance with sternal precautions during self cares.  Pt. Has all DME including tub transfer bench at home now.  Pt/sister verbalize understanding of pt. Set up and needs for self care.  She will need assist with B. Feet for bathing, drying and placing medication on (nursing addressing) and min. Assist with B. LE compression stockings.  Pt. Verbalizes understanding of need to continue to take appropriate rest breaks upon return to home.  Sister will be at home with pt. As needed, as spouse reportedly had a set back with drinking beer.     PLAN     Discharge Recommendations: None at present. Already addressed with pt/Sister        DME Recommendations:   To be determined later          Treatment Diagnosis   Primary Dx: Respiratory failure, s/p AVR      SUBJECTIVE   Pt. Agreeable to OT treatment for final OT session.     Pain level   0/10   Social history  Lives with: Spouse  Support systems: good support from sister. Spouse reportedly will assist once pt. arrives home.  Sister reports she can stay a week or so if needed to assist.   Prior LOF  Prior ambulatory status: Walked without assistive device  Home/Community: Pt. was independent in home & community   Home environment  Living Situation: House  Home style: One level  Number of stairs to enter home: 5  Railings into home: Double railing  Tub/Shower: Yes  Stall Shower: No  ADA Height Toilet: No     OBJECTIVE   OT treatment focused on safety and compliance with precautions this self care session of shower, grooming, dressing and func. Mobility.     Mental status  General Demeanor: Pleasant;Cooperative  LOC: Alert  Orientation: Oriented x 4  Directions: Follows one-step commands  -->  Follows one-step commands: Consistently  Attention span: Appears intact  Memory: Appears intact in social/therapy situations  Awareness of errors: Good awareness of errors  Safety judgment: Good awareness of safety precautions     Other   Vision - Oculomotor  Specifics: Corrective Lenses  Corrective Lenses: Wears glasses for distance;Wears glasses for reading     TREATMENT  Transfers/Bed Mobility  Transition  Assist Level/Comments   Rolling      --> Sitting up     --> Standing  Mod. I   Pivot Transfer     --> Sitting down     --> Supine     Scooting up in bed       ADLs/Transfers     ADL   Assist Level/Comments   Eating (Meal prep, self feeding)     Grooming  Mod. I   Upper Body Dressing  Mod. I   Lower Body Dressing  supervision, assist only with compression sleeves B. LE's   Bathing  min. Assist to wash/dry toes.   Toileting  Mod. I   Patent attorney. I   Tub or Licensed conveyancer. I        Therapeutic activities   reviewed precautions with Sister and safety at home with pt/sister.   Training/Education  Trainee(s):  Primary Learner  Primary Learner: Patient  Primary Learner - Barriers to Learning?: No  Training provided: Reviewed goals, sternal precautions, DME          Admisssion Discharge   Eating (Meal prep, self feeding)       Grooming CGA, Needs guidance for safety     Bathing Mod Assist Mod Assist   Tub or Shower Transfer       Upper Body Dressing Min Assist Modified Independence   Lower Body Dressing Max Assist Modified Independence (with use of A/E(except min. assist for compression sleeveLEs)   Toileting Max Assist SBA (with use of toilet tongs)   Toilet Transfer Min Assist, Verbal cues, Mod Assist SBA, Verbal cues     GOALS     Patient/Caregiver goals reviewed and integrated with rehab treatment plan:     Multidisciplinary Problems (Active)     Not on file      Multidisciplinary Problems (Resolved)        Problem: ADLs    Goal Priority Disciplines Outcome   ADL Activity Tolerance for Self Care      (Resolved)     OT Adequate for Discharge   Description:  Pt  to tolerate 10  minutes of self care activity in standing position    ADL Grooming   (Resolved)     OT Completed   Description:  Patient to be able to perform grooming with mod I assist.    ADL Toilet Transfers   (Resolved)     OT Completed   Description:  Patient to be able to perform toilet transfers with modified independence..     ADL Tub Transfers   (Resolved)     OT Completed   Description:  Patient to be able to perform tub/shower transfers with SBA.    ADL Upper Body Bathing   (Resolved)     OT Completed   Description:  Patient to be able to perform upper body bathing with mod I assist.    ADL Lower Body Bathing   (Resolved)     OT Adequate for Discharge   Description:  Patient to be able to perform lower body bathing with stand by assist.      ADL Upper Body Dressing   (Resolved)     OT Completed   Description:  Patient to be able to perform upper body dressing with mod I assist.    ADL Lower Body Dressing   (Resolved)     OT Adequate for Discharge   Description:  Patient to be able to perform lower body dressing with modified independence. .     ADL Toileting   (Resolved)     OT Completed   Description:  Patient to be able to perform toilet hygiene and clothes management with modified independence.     Caregiver   (Resolved)     OT Completed   Description:  Caregiver will demonstrate the ability to assist with patient's care, as needed.           Problem: IP Post Cardiac Goal List    Goal Priority Disciplines Outcome   Precautions   (Resolved)     OT Completed   Description:  Patient to follow sternal  precautions, while performing ADLs.           Problem: Safety    Goal Priority Disciplines Outcome   Safety DME   (Resolved)     OT Completed   Description:  Pt/Caregiver to demonstrate awareness of DME needs for  safety upon discharge.                  First session Second session (if applicable)    Start/Stop times: 1100 - 1230 Start/Stop times:    - Stop Time:     Total time: 90 minutes  Total time:   minutes     Therapist:  Azuree Minish, OT

## 2016-02-25 NOTE — Progress Notes (Signed)
PMR Physician Daily Note     General and Subjective:   Feels very well, excited to go home tomorrow. Denies any shortness of breath.        Observations and Exam:  Vital Sign Ranges for Last 24 Hours:  BP  Min: 104/63  Max: 154/80  Temp  Min: 36.3 ?C (97.4 ?F)  Max: 36.8 ?C (98.2 ?F)  Pulse  Min: 80  Max: 86  Resp  Min: 18  Max: 18  SpO2  Min: 97 %  Max: 100 %    Alert, oriented  Lungs clear  Heart regular rate and rhythm  Fingers and toes, eschar areas very slightly improving  Edema in right > left leg still present but less        Recent Glucose Monitoring  Lab Results   Component Value Date    POCGLU 111 (H) 02/15/2016    POCGLU 118 (H) 02/10/2016    POCGLU 91 02/10/2016    POCGLU 162 (H) 02/10/2016    POCGLU 102 (H) 02/10/2016           Intake/Output Summary (Last 24 hours) at 02/25/16 0923  Last data filed at 02/25/16 1610   Gross per 24 hour   Intake              708 ml   Output             1200 ml   Net             -492 ml       Recent Labs  Results for orders placed or performed during the hospital encounter of 02/17/16 (from the past 24 hour(s))   Protime Panel -Now & Q AM    Collection Time: 02/25/16  6:46 AM   Result Value Ref Range    Protime 31.3 (H) 10.0 - 13.0 Seconds    INR 2.8 (H) 0.9 - 1.2       [x]     I have reviewed the Rehabilitation  Interdisciplinary Notes.   Medical/Function Problems:   Patient Active Problem List    Diagnosis SNOMED CT(R) Date Noted   . Aortic valve disorders AORTIC VALVE DISORDER 02/24/2016   . Aortic valve disorder AORTIC VALVE DISORDER 02/17/2016   . Impaired gait and mobility ABNORMAL GAIT 02/17/2016   . Impaired activities of daily living ACTIVITY OF DAILY LIVING (ADL) ALTERATION 02/17/2016   . Peripheral edema PERIPHERAL EDEMA 02/17/2016   . Ischemia of digits of hands and feet UPPER LIMB ISCHEMIA 02/17/2016   . Blood loss anemia ANEMIA DUE TO BLOOD LOSS 02/17/2016   . Hypokalemia HYPOKALEMIA 02/17/2016   . Acute renal failure with tubular necrosis (CMS/HCC) ACUTE  RENAL FAILURE SYNDROME 02/13/2016   . Heparin induced thrombocytopenia (CMS/HCC) HEPARIN-INDUCED THROMBOCYTOPENIA 02/02/2016   . S/P AVR (aortic valve replacement) HISTORY OF AORTIC VALVE REPLACEMENT 01/27/2016   . Non-ischemic cardiomyopathy (CMS/HCC) CARDIOMYOPATHY 01/23/2016   . Respiratory failure (CMS/HCC) RESPIRATORY FAILURE 01/23/2016   . Aortic stenosis, severe AORTIC VALVE STENOSIS 01/22/2016   . Acute systolic heart failure (CMS/HCC) ACUTE SYSTOLIC HEART FAILURE 01/22/2016   . Heart failure (CMS/HCC) HEART FAILURE 01/21/2016   . Elevated brain natriuretic peptide (BNP) level HORMONE LEVEL - FINDING 01/21/2016   . NSTEMI (non-ST elevated myocardial infarction) (CMS/HCC) ACUTE NON-ST SEGMENT ELEVATION MYOCARDIAL INFARCTION 01/21/2016   . Demand ischemia of myocardium (CMS/HCC) ACUTE ISCHEMIC HEART DISEASE 01/21/2016   . Tachycardia TACHYCARDIA 01/21/2016   . Pleural effusion PLEURAL EFFUSION 01/21/2016   . Obesity  OBESITY 01/21/2016   . Acute respiratory failure with hypoxia (CMS/HCC) ACUTE RESPIRATORY FAILURE 01/21/2016   . Hypotension LOW BLOOD PRESSURE 01/21/2016   . Iron deficiency anemia IRON DEFICIENCY ANEMIA 01/21/2016   . Thrombocytosis (CMS/HCC) THROMBOCYTOSIS 01/21/2016   . Weight loss WEIGHT DECREASED 01/21/2016   . Family history of cancer FAMILY HISTORY OF CANCER 01/21/2016   . Cardiomegaly CARDIOMEGALY 01/21/2016   . Mediastinal lymphadenopathy MEDIASTINAL LYMPHADENOPATHY 01/21/2016   . Hilar lymphadenopathy- bilataeral  HILAR LYMPHADENOPATHY 01/21/2016   . Chest pain CHEST PAIN 01/21/2016   . Dyspnea and respiratory abnormality DYSPNEA 01/21/2016   . Abnormal EKG ELECTROCARDIOGRAM ABNORMAL 01/21/2016   . Opacity of lung on imaging study ABNORMAL FINDINGS ON DIAGNOSTIC IMAGING OF LUNG 01/21/2016   . Liver lesion- small hypodense lesion around falciform lig LESION OF LIVER 01/21/2016       Impression and Plan:    Doing quite well, anticipate stable for discharge tomorrow.         [x]     Patient  remains appropriate for inpatient rehabilitation facility level of care, continue present therapies and medical treatment and monitoring.    []     See orders for any treatment modifications     Hansel Feinstein  02/25/2016 9:23 AM

## 2016-02-25 NOTE — Treatment Summary (Signed)
[facility] PT TREATMENT NOTE    [name] / 2303/[id number] DOB:  [birth year] / 60 y.o. Date:  02/25/2016     Precautions  Specific mobility precautions: Sternal precautions  Sternal precautions: Standard  Required Braces/Orthotics: None  Other: Very sore (R) 3rd toe; Ejection fraction estimated to be 30-35%     ASSESSMENT     Summary:  Pt has made excellent gains in therapy. Has met most goals, mod indep/SBA for mobility and gait. She is much improved in activity tolerance, able to stand for several minutes to perform BLE ex's, ambulating >180 several times a day. Pt's sister present last few days for CG training and both exhibit safe behaviors for mobility and gait. Pt mostly indep with sternal precautions, needs very occasional reminder for UE positioning. Pt is appropriate for dc home tomorrow with her husband and sister for assistance. She has her own 4ww. Pt will not be receiving any f/u therapy services due to her remote living situation, but was indep with HEP and handout given.     Activity tolerance: Tolerates 30 min activity with multiple rests  Comments: Improving daily     Precaution Awareness: Fully aware of precaution(s)  Deficit Awareness: Fully aware of deficits  Correction of Errors: Self-corrects with cues  Safety Judgment: Good awareness of safety     DME Needs:   DME Needs: 2XB     PLAN     Intensity/Frequency:  Duration:   Intensity/frequency: 90 minutes daily, 5x/week  14 days      Continue intensive level of Physical therapy to address progressive mobility training, caregiver training and DME needs.          Treatment Diagnosis Surgery (if applicable)   Primary Dx: Acute cardio-pulmonary failure; severe aortic stenosis which required aortic valve replacement  Treatment Dx: Impaired mobility  Procedure/Date: 01/27/2016: Aortic valve replacement       SUBJECTIVE   I think I'm doing well, I think I'm ready to go home     Pain Level  Current status: Pain does not limit pt's ability to  participate in therapy  Pain at start of session: 2  Pain at end of session: 2  Pain location: Leg (BLE)  Pain descriptors: Sore     OBJECTIVE   Seen for gait, transfers and step negotiation. Also, issued HEP and reviewed with pt able to perform indep. Biodex x 15 min.     Mental status:   General Demeanor: Pleasant, Cooperative  LOC: Alert  Orientation: Oriented x 4  Oriented to: Person, Place, Situation  Directions: Follows multi-step commands  --> Follows multi-step commands: Consistently  --> Follows one-step commands: Consistently  Attention span: Appears intact  Memory: Appears intact in social/therapy situations   Medical appliances:   Medical Appliances  Medical Appliances: None     Observation:   Observation  Narrative: Eschar to toes and R forefinger     Other:    Coordination  LEs - General: Bilateral movements are reasonably coordinated  Tone  Baseline: Normal      TREATMENT    Bed Mobility/Transfers  Bed mobility       Rolling / logroll  Roll L: Modified Independent  Rolling R: Modified Independent      Transition to sitting up Sidelying to Sit: Modified Independent   To resting position      Transition to sitting down Modified Independent     Transition to supine Modified Independent     Scooting Distant Supervision  Transfers      Transition to standing From bed: Modified Independent  From wheelchair: Modified Independent  From mat: Modified Independent     Method Stand pivot: Modified Independent          Gait /Stair training:   Mobility - Gait (Level)  Gait (distance): 180 feet (x4 through course of day)  Gait (assist level): Distant Supervision  Assistive device: Front wheeled walker (FWW)  Endurance: fair+  Pace: moderate, WFL  Pattern: Wide BOS;Decreased foot clearance L;Decreased foot clearance R;Decreased step length L;Decreased step length R;Decreased L;Decreased R  Surface: Level tile  Mobility - Gait (Stairs/curbs)  Number of Stairs: 5x4  Step height: 6  Curb height: 6  Pattern: Step  to  Assist level: Modified Independence;SBA;Verbal cues  Assistive device: Front wheeled walker (FWW);Hand railing bilaterally      Ther. Ex.:   LE exercises: Seated long arc quads;Standing hip abd/adduction;Standing hip flexion;Standing knee flexion;Standing mini squats;Ankle pumps  Ankle pumps: 20  Seated long arc quads: 10  Standing hip abd/adduction: 10  Standing hip flexion: 10  Standing knee flexion: 10  Mini squats: 10  Addit. Exercises: sit <> stand 3 x5; Biodex    Biodex recumbent elliptical machine x 16 minutes, level 1 resistance, flat profile, bilateral UE/LE for interlimb coordination and conditioning.   Balance training:   Balance - Basic  Static sitting balance: Assist level;Position  Assist level: Modified Independence  Dynamic sitting balance: Assist level  Assist level: Modified Independence  Static standing balance: Assist level  Assist level: Distant Supervision  Dynamic standing balance: Assist level  Assist level with weight shifts: SBA     Training/Education:  Trainee(s): Primary Learner;Co-Learner  Primary Learner: Patient;Family member  Engineer, production - Barriers to Learning?: No  Co-learner: Family member (sister)  Co-Learner - Barriers to Learning?: No  Training provided: Safety; handout for HEP  Response to training: receptive; demonstrated HEP   Safety/Room set-up:     Call button accessible?: Yes  Phone accessible?: Yes  Oxygen reconnected?: No, on room air  Patient mobility in room: Able to be up in room independently.  Position on arrival: In bed  Position on departure (bed): 2 bedrails up  Comments: In bed at end of session     GOALS     Patient/Caregiver goals reviewed and integrated with rehab treatment plan:     Multidisciplinary Problems (Active)        Problem: Caregiver Training    Goal Priority Disciplines Outcome   Caregivers to demonstrate safe patient assist, as needed     PT           Problem: DME    Goal Priority Disciplines Outcome   Durable Medical Equipment needs to be  met     PT           Problem: Mobility: Ambulation    Goal Priority Disciplines Outcome   Gait (with precautions)     PT    Description:  Pt. to ambulate 150 feet at modified independent level, following sternal precautions, using 4WW.    Stairs (with precautions)     PT    Description:  Pt. to ascend/descend 6 stairs with SBA, using (B) rails, following sternal precautions.            Problem: Mobility: Transfers    Goal Priority Disciplines Outcome   Supine to/from sitting (with precautions)     PT    Description:  Pt. to transfer to/from sitting at modified independent level,  following sternal precautions.    Bed to/from chair (with precautions)     PT    Description:  Pt. to transfer bed to/from chair at modified independent level, following sternal precautions.           Problem: Patient/Family Goal    Goal Priority Disciplines Outcome   Home     PT    Description:  Pt. wants to return to previous living situation.           Problem: Precautions    Goal Priority Disciplines Outcome   Application of Precautions     PT    Description:  Pt. to verbally recall and follow all pertinent precautions, including sternal precautions.                  First session Second session (if applicable)   Start/Stop times: 0930 - 1015 Start/Stop times: 1330 - 1420   Total time: 45 minutes  Total time: 50 minutes      Therapist:  Penelope Coop, PT

## 2016-02-25 NOTE — Discharge Summary (Signed)
Inpatient Rehabilitation Physician Discharge Summary     Patient ID:  Name: Brandy [name]  DOB: June 21, 1955 60 y.o.  MRN: [id number]  CSN: [id number]    Admit date: 02/17/2016    Discharge date: 02/26/2016  1:22 PM    Admission Diagnosis: weakness    Discharge Diagnoses:    Patient Active Problem List    Diagnosis SNOMED CT(R) Date Noted   . Aortic valve disorders AORTIC VALVE DISORDER 02/24/2016   . Aortic valve disorder AORTIC VALVE DISORDER 02/17/2016   . Impaired gait and mobility ABNORMAL GAIT 02/17/2016   . Impaired activities of daily living ACTIVITY OF DAILY LIVING (ADL) ALTERATION 02/17/2016   . Peripheral edema PERIPHERAL EDEMA 02/17/2016   . Ischemia of digits of hands and feet UPPER LIMB ISCHEMIA 02/17/2016   . Blood loss anemia ANEMIA DUE TO BLOOD LOSS 02/17/2016   . Hypokalemia HYPOKALEMIA 02/17/2016   . Acute renal failure with tubular necrosis (CMS/HCC) ACUTE RENAL FAILURE SYNDROME 02/13/2016   . Heparin induced thrombocytopenia (CMS/HCC) HEPARIN-INDUCED THROMBOCYTOPENIA 02/02/2016   . S/P AVR (aortic valve replacement) HISTORY OF AORTIC VALVE REPLACEMENT 01/27/2016   . Non-ischemic cardiomyopathy (CMS/HCC) CARDIOMYOPATHY 01/23/2016   . Respiratory failure (CMS/HCC) RESPIRATORY FAILURE 01/23/2016   . Aortic stenosis, severe AORTIC VALVE STENOSIS 01/22/2016   . Acute systolic heart failure (CMS/HCC) ACUTE SYSTOLIC HEART FAILURE 01/22/2016   . Heart failure (CMS/HCC) HEART FAILURE 01/21/2016   . Elevated brain natriuretic peptide (BNP) level HORMONE LEVEL - FINDING 01/21/2016   . NSTEMI (non-ST elevated myocardial infarction) (CMS/HCC) ACUTE NON-ST SEGMENT ELEVATION MYOCARDIAL INFARCTION 01/21/2016   . Demand ischemia of myocardium (CMS/HCC) ACUTE ISCHEMIC HEART DISEASE 01/21/2016   . Tachycardia TACHYCARDIA 01/21/2016   . Pleural effusion PLEURAL EFFUSION 01/21/2016   . Obesity OBESITY 01/21/2016   . Acute respiratory failure with hypoxia (CMS/HCC) ACUTE RESPIRATORY FAILURE 01/21/2016   . Hypotension  LOW BLOOD PRESSURE 01/21/2016   . Iron deficiency anemia IRON DEFICIENCY ANEMIA 01/21/2016   . Thrombocytosis (CMS/HCC) THROMBOCYTOSIS 01/21/2016   . Weight loss WEIGHT DECREASED 01/21/2016   . Family history of cancer FAMILY HISTORY OF CANCER 01/21/2016   . Cardiomegaly CARDIOMEGALY 01/21/2016   . Mediastinal lymphadenopathy MEDIASTINAL LYMPHADENOPATHY 01/21/2016   . Hilar lymphadenopathy- bilataeral  HILAR LYMPHADENOPATHY 01/21/2016   . Chest pain CHEST PAIN 01/21/2016   . Dyspnea and respiratory abnormality DYSPNEA 01/21/2016   . Abnormal EKG ELECTROCARDIOGRAM ABNORMAL 01/21/2016   . Opacity of lung on imaging study ABNORMAL FINDINGS ON DIAGNOSTIC IMAGING OF LUNG 01/21/2016   . Liver lesion- small hypodense lesion around falciform lig LESION OF LIVER 01/21/2016       Significant Diagnostic Studies:  No results found for this visit on 02/17/16 (from the past 48 hour(s)).    No orders to display       Consults:   None    Vital Sign Ranges for Last 24 Hours:  BP  Min: 104/63  Max: 154/80  Temp  Min: 36.3 ?C (97.4 ?F)  Max: 36.8 ?C (98.2 ?F)  Pulse  Min: 80  Max: 86  Resp  Min: 18  Max: 18  SpO2  Min: 97 %  Max: 100 %          Course on Rehabilitation Unit and Functional Gains:  This is a highly-complex 60 year-old woman who has had bioprosthetic aortic valve replacement. ?Her hospitalization characterized by multiple severe issues including HIT, atrial fib, ARF, CHF, Non-STEMI, severe blood-loss anemia (thought to be from GI source), dysphagia, persistent pulmonary insufficiency,  and general weakness and deconditioning. ?Continues with marked edema in both lower limbs. ?Has multiple areas of ischemic changes in fingers and toes.  She was transferred to inpatient rehab center where she did very well. Remained fully stable medically.   We tried some nitrogycerin ointment on her necrotic digits to improve blood flow.  This was well tolerated and we decided to continue this at home since it may help preserve some  tissue.  She was instructed in safety aspects of this technique.   No acute medical issues, she met all of her goals and was discharged home in good condition.   Her fluid overload and peripheral edema, with which she was admitted to [facility], improved considerably though she still did have some edema in legs.   The following tables indicate her functional improvements.     ?   Admisssion Discharge   Eating (Meal prep, self feeding)       Grooming CGA, Needs guidance for safety     Bathing Mod Assist Mod Assist   Tub or Shower Transfer       Upper Body Dressing Min Assist Modified Independence   Lower Body Dressing Max Assist Modified Independence (with use of A/E(except min. assist for compression sleeveLEs)   Toileting Max Assist SBA (with use of toilet tongs)   Toilet Transfer Min Assist, Verbal cues, Mod Assist SBA, Verbal cues   ?      Discharge Medications:     Patient's Discharge Medication List      TAKE these medications       Indications of Use    aspirin 81 MG EC tablet   Take 1 tablet by mouth daily.        atorvastatin 20 MG tablet   Quantity:  30 tablet   Commonly known as:  LIPITOR   Take 1 tablet by mouth every night at bedtime.        carvedilol 6.25 MG tablet   Quantity:  60 tablet   Commonly known as:  COREG   Take 1 tablet by mouth 2 times daily with meals.        furosemide 40 MG tablet   Quantity:  30 tablet   Commonly known as:  LASIX   Take 1 tablet by mouth daily.        nitroglycerin 2 % ointment   Quantity:  10 packet   Commonly known as:  NITROSTAT   Place 0.1 inches onto the skin 3 times daily.        potassium chloride SA 20 MEQ tablet   Quantity:  30 tablet   Commonly known as:  K-DUR,KLOR-CON   Take 1 tablet by mouth daily.        warfarin 3 MG tablet   Quantity:  30 tablet   Commonly known as:  COUMADIN   Take 1 tablet by mouth daily.              Patient Instructions/Follow-up Appointments:     Activity: as tolerated, maintain sternal precautions  Diet: heart healthy  Discharge  Disposition:  Home with family  Follow-ups:  Follow up with [name], DO On 03/03/2016.      Specialty: Family Medicine   Why: Check in at 7:30am. These are the only visits times for follow up appts, once seen, the office will assign you a permanent physician and you can pick better times for appointments.   Contact information:   [address]   [address]  [place] [address]   [phone]         Follow up with Otho Bellows, MD On 03/08/2016.   Specialties: Cardiothoracic Surgery, Radiology   Why: Check in for a 10:15 AM appointment with Rosilyn Mings NP. Please bring the following with you: Insurance card and ID. Current medication list. Any labs that have been done since your discharge.    Contact information:   [address]   [place] [address]   [phone]         Follow up with [facility].   Why: FYI: 4ON and Tub bench vendor   Contact information:   [address] Franne Grip   [place]   [phone]      Follow up with [facility] On 02/28/2016.   Why: Arrive at the Clinic on Monday 9/18 for an 11:15 AM ?blood test for your Coumadin dosing.   Contact information:   [address]   [facility]   [phone]      Follow up with [facility].   Why: Physical and Occupational Therapists, we faxed orders, please call day after discharge to make contact.   Contact information:   [address]   [facility]   [phone]      Follow up with Outpatient Care Management RN.   Why: FYI: This is a program that may be able to provide a nurse coming to see you on occassion, she can not draw labs but would be able to assist you with things like transportation to local appointments and would be eyes on you to be sure you are well.   Contact information:   [phone]   ?            LOS: 9 days       Signed:  Hansel Feinstein  02/28/2016  9:53 AM

## 2016-02-25 NOTE — Discharge Planning (AHS/AVS) (Signed)
Transitional Care Plan     Name:Brandy [name] MRN#: [id number]   Date of Birth:1955-07-15 PCP:NO FAMILY PHYSICIAN,   Admit Date: 02/17/2016 Discharge Date: 02/25/2016   Admitting Physician: Hansel Feinstein, DO Admitting Diagnosis:weakness   Readmission Risk: 13     Risk Factors:Potential Risk Factors: Change in care needs, Severity of injury/illness, Inadequate/lacking support      Emergency Contacts:  Extended Emergency Contact Information  Primary Emergency Contact: Wesch,[name]  Address: [address]           [place], [place] [address] [place]  Home Phone: [phone]  Relation: Spouse  Secondary Emergency Contact: Thompson,Casey   [employer] Phone: [phone]  Relation: Sister Follow ups:   [name], DO  [address]  [address]  [place] [address]  [phone]    On 03/03/2016  Check in at 7:30am. These are the only visits times for follow up appts, once seen, the office will assign you a permanent physician and you can pick better times for appointments.    Otho Bellows, MD  [address], [address]  [place] [address]  [phone]    On 03/08/2016  Check in for a 10:15 AM appointment with Rosilyn Mings NP. Please bring the following with you: Insurance card and ID. Current medication list. Any labs that have been done since your discharge.     [facility]  [address]  [place]  [phone]    FYI: [phone] and Tub bench vendor    [facility]  [address]  [place]  [phone]  On 02/28/2016  Arrive at the Clinic on Monday 9/18 for an 11:15 AM  blood test for your Coumadin dosing.    [facility]  [address]  [facility]  [phone]    Physical and Occupational Therapists, we faxed orders, please call day after discharge to make contact.    Outpatient Care Management RN  [phone]    FYI: This is a program that may be able to provide a nurse coming to see you on occassion, she  can not draw labs but would be able to assist you with things like transportation to local appointments and would be eyes on you to be sure you are well.           DIAGNOSIS     Discharge Diagnosis: Impaired gait, mobility and self care S/P AVR on 8/17    INTERVENTIONS   Discharge Disposition: Home or Self Care (No HH services her area, will use O/P and Coumadin Clinic)           DME Equipment Provided: 4WWalker, Tub Transfer Bench ([employer], TTB) and Life Vest   Type: Rehab, Other (Comment) (PT/OT and Coumadin Clinic)    SOCIAL WORK      POLST:     ADDITIONAL INFORMATION      [facility]  [address]athane[address] Canary  [place] [address]  Discharge Orders and Meds Brandy [name]  MRN: [id number]  DOB: 09-16-1955, Sex: F  Adm: 02/17/2016, D/C:    Receiving Agency/Facility    ?    Home Health Agency    Hospice Agency    SNF - Receiving Facility     ICF - Receiving Facility     ** Attending Contacted Accepting ** ?(For SNF)    ALF/FH - Alternative  Living Facility    Selected Agency (Home Infusions)    Receiving Facility (Acute Rehab/LTAC/VA)   ?                   ?   Hospital Diagnoses    ?          Codes   ? Demand ischemia of myocardium (CMS/HCC) ICD-9-CM: 411.89  ICD-10-CM: I24.8   ? Non-ischemic cardiomyopathy (CMS/HCC) ICD-9-CM: 425.4  ICD-10-CM: I42.8   ? S/P AVR (aortic valve replacement) ICD-9-CM: V43.3  ICD-10-CM: Z95.2   ? Heparin induced thrombocytopenia (CMS/HCC) ICD-9-CM: 289.84  ICD-10-CM: D75.82   ? Impaired gait and mobility ICD-9-CM: 781.2  ICD-10-CM: R26.89   ? Impaired activities of daily living ICD-9-CM: 799.3  ICD-10-CM: R53.81   ? Peripheral edema ICD-9-CM: 782.3  ICD-10-CM: R60.9   ? Ischemia of digits of hands and feet ICD-9-CM: 459.9  ICD-10-CM: I99.8   ? Blood loss anemia ICD-9-CM: 280.0  ICD-10-CM: D50.0   ? Hypokalemia ICD-9-CM: 276.8  ICD-10-CM: E87.6   ?   Code Status Information    ? Code Status   ? Full Code   ?   Discharge Orders  Comment??Expand??Hide   ?    ?  Last edited by  on ?at    ? Start       Ordered   ? 02/25/16 0930  Discharge to Home / Self Care Once ?    02/25/16 0930   ?   Rehab Potential  Comment   ?    ? Last edited by  on ?at    ? None   ?   Diet Instructions    ? Diet - General    ? Diet Type / Consistency: Regular      ?    ?   Activity Instructions    ? Activity as tolerated    ?    ?    ?   Follow-up Information    ? Follow up with [name], DO On 03/03/2016.   ? Specialty: Family Medicine   ? Why: Check in at 7:30am. These are the only visits times for follow up appts, once seen, the office will assign you a permanent physician and you can pick better times for appointments.   ? Contact information:   ? [address]   [address]   [place] [address]   [phone]      ?    ? Follow up with Otho Bellows, MD On 03/08/2016.   ? Specialties: Cardiothoracic Surgery, Radiology   ? Why: Check in for a 10:15 AM appointment with Rosilyn Mings NP. Please bring the following with you: Insurance card and ID. Current medication list. Any labs that have been done since your discharge.    ? Contact information:   ? [address]   [place] [address]   [phone]      ?    ? Follow up with [facility].   ? Why: FYI: 4ON and Tub bench vendor   ? Contact information:   ? [address]   [place]   [phone]   ?    ? Follow up with [facility] On 02/28/2016.   ? Why: Arrive at the Clinic on Monday 9/18 for an 11:15 AM ?blood test for your Coumadin dosing.   ? Contact information:   ? [address] [facility]   [phone]   ?    ?  Follow up with [facility].   ? Why: Physical and Occupational Therapists, we faxed orders, please call day after discharge to make contact.   ? Contact information:   ? [address]   [address]   [facility]   [phone]   ?    ? Follow up with Outpatient Care Management RN.   ? Why: FYI: This is a program that may be able to provide a nurse coming  to see you on occassion, she can not draw labs but would be able to assist you with things like transportation to local appointments and would be eyes on you to be sure you are well.   ? Contact information:   ? [phone]   ?   Durable Medical Equipment    ? Please use the Life Vest as instructed.      ?   You are allergic to the following  Date Reviewed: 02/25/2016   ? Allergen Reactions   ? Heparin Anaphylaxis   ? Possible HIT   ?     ? Sulfa (Sulfonamide Antibiotics) Rash   ? In ICU for 3 days post dose     ?   ? Discharge Medication List   ?   ?   Your medication list   ?   START taking these medications    ?    Indications of Use Last Dose Given Next Dose Due   ? aspirin 81 MG EC tablet   Take 1 tablet by mouth daily.   ?         ? atorvastatin 20 MG tablet   Quantity: 30 tablet   Commonly known as: LIPITOR   Take 1 tablet by mouth every night at bedtime.   ?         ? carvedilol 6.25 MG tablet   Quantity: 60 tablet   Commonly known as: COREG   Take 1 tablet by mouth 2 times daily with meals.   ?         ? furosemide 40 MG tablet   Quantity: 30 tablet   Commonly known as: LASIX   Take 1 tablet by mouth daily.   ?         ? nitroglycerin 2 % ointment   Quantity: 10 packet   Commonly known as: NITROSTAT   Place 0.1 inches onto the skin 3 times daily.   ?         ? potassium chloride SA 20 MEQ tablet   Quantity: 30 tablet   Commonly known as: K-DUR,KLOR-CON   Take 1 tablet by mouth daily.   ?         ? warfarin 5 MG tablet   Quantity: 30 tablet   Commonly known as: COUMADIN   Take 1 tablet by mouth daily.   ?         ?   ?   Where to Get Your Medications   ?   These medications were sent to [facility] [place] - [address]  [address] [place] [address]   ?  Phone: [phone]   ?      atorvastatin 20 MG tablet        carvedilol 6.25 MG tablet        furosemide 40 MG tablet        nitroglycerin 2 % ointment        potassium chloride SA  20 MEQ tablet        warfarin 5 MG  tablet    ?   You can get these medications from any pharmacy    ? You don't need a prescription for these medications   ?      aspirin 81 MG EC tablet    ?      ?   Discharge Signing Provider    ?   Discharge to Home / Self Care [[id number]]    ? Electronically signed by: Hansel Feinstein, DO on 02/25/16 0930 Status: Active   ? Ordering user: Hansel Feinstein, DO 02/25/16 0930 Ordering provider: Hansel Feinstein, DO   ?            A Copy of this note was routed to:  N/A.  Going to TCM on 9/22, chart available to APP Physicians

## 2016-02-26 LAB — PROTIME-INR
INR: 2.8 — ABNORMAL HIGH (ref 0.9–1.2)
Protime: 31.9 Seconds — ABNORMAL HIGH (ref 10.0–13.0)

## 2016-02-26 MED ORDER — warfarin (COUMADIN) 3 MG tablet
3 | ORAL_TABLET | Freq: Every day | ORAL | 1 refills | 62.00000 days | Status: DC
Start: 2016-02-26 — End: 2016-03-06

## 2016-02-26 MED FILL — NITRO-BID 2 % TRANSDERMAL OINTMENT: 2 % | TRANSDERMAL | Qty: 1

## 2016-02-26 MED FILL — CARVEDILOL 3.125 MG TABLET: 3.125 mg | ORAL | Qty: 2

## 2016-02-26 MED FILL — FUROSEMIDE 40 MG TABLET: 40 mg | ORAL | Qty: 1

## 2016-02-26 MED FILL — AMIODARONE 200 MG TABLET: 200 mg | ORAL | Qty: 1

## 2016-02-26 MED FILL — ATORVASTATIN 20 MG TABLET: 20 mg | ORAL | Qty: 1

## 2016-02-26 MED FILL — ASPIRIN 81 MG TABLET,ENTERIC COATED: 81 mg | ORAL | Qty: 1

## 2016-02-26 MED FILL — FAMOTIDINE 20 MG TABLET: 20 mg | ORAL | Qty: 1

## 2016-02-26 MED FILL — COUMADIN 2 MG TABLET: 2 mg | ORAL | Qty: 1

## 2016-02-26 MED FILL — KLOR-CON M10 MEQ TABLET,EXTENDED RELEASE: 10 meq | ORAL | Qty: 2

## 2016-02-26 MED FILL — COUMADIN 1 MG TABLET: 1 mg | ORAL | Qty: 1

## 2016-02-26 NOTE — Progress Notes (Signed)
Pt slept all night with no complaints. Very happy and pleasant this morning. Toes and R index finger painted with nitro paste. Denies any pain. DC today.

## 2016-02-26 NOTE — Progress Notes (Signed)
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 admitted on 02/17/2016 with a NSTEMI s/p R&L heart catheterization. PICC line placed 8/25. CRRT from 8/20-8/25. 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     Allergies: Heparin and Sulfa (sulfonamide antibiotics)  PMH:  has a past medical history of History of blood transfusion.   Active problems:   ? Heparin induced thrombocytopenia  ? S/P Aortic valve replacement     OBJECTIVE:     Height:   Admit weight: 102.1 kg (225 lb 1.4 oz) Last charted weight: 90.3 kg (199 lb) IBW:   kg Body mass index is 32.12 kg/(m^2).    Recent Labs      02/24/16   0626   NA  140   K  3.7   CL  109   CO2  24.0   GLU  92   BUN  9   CREATININES  0.80   CALCIUM  10.0     Recent Labs      02/24/16   0626   WHITEBLOODCE  6.0   NEUTROABS  4.3   BANDSABS  0.1   HGB  10.3*   HCT  31.8*   LABPLAT  256     Warfarin Dosing Record:  Date: 8/31 9/1 9/2 9/3 9/4 9/5 9/6 9/7 9/8 9/9 9/10 9/11   INR: 2.9 3 4.1 2.8 3.2 4.0 4.2 @ 0517  3.6 @ 1055  3.8 @ 1530 2.8 3.1 2 1.2 1.8   Warfarin: 3 mg 3 mg 2.5 3 mg 5 mg 5 mg Hold 3mg  Hold 3 mg 3 mg 3 mg     Date: 9/12 9/13 9/14 9/15 9/16          INR: 1.9 1.8 2.0 2.8 2.8          Warfarin: 4 mg 5 mg 5 mg 3 mg               ASSESSMENT:     Recent vitamin K administration: None noted in Epic   Warfarin Indication:  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 Amiodarone (currently being tapered down)   Warfarin education status: Completed on 02/16/16     PLAN:     Warfarin:  ? Decrease warfarin to 3 mg daily.   ? Daily INR/PT ordered. Pt discharging todayand then first outpatient INR will be  on Monday 9/18.  ? Pharmacy to monitor and adjust dosing as needed    Signed:  Luna Fuse, [facility]

## 2016-02-26 NOTE — Progress Notes (Signed)
Pt ready for DC home with sister.  They plan to pick up supplies and Rxs in [facility], then head back to [place] where the sister lives.  Info given for meds, med times, and follow-up appointments.  Pt very appreciative of all the care she has received here.  Pt also rec'd instructions re: Life Vest which she must wear at all times (except for bathing)--instructed by Pearson Grippe, RN, cardiac specialist for these vests.

## 2016-02-28 ENCOUNTER — Ambulatory Visit: Admit: 2016-02-28 | Discharge: 2016-02-28

## 2016-02-28 ENCOUNTER — Ambulatory Visit

## 2016-02-28 DIAGNOSIS — Z5181 Encounter for therapeutic drug level monitoring: Secondary | ICD-10-CM

## 2016-02-28 LAB — PROTIME-INR: INR: 1.5 — AB (ref <=1.1)

## 2016-02-28 LAB — POCT INR
POC INR: 1.5 — ABNORMAL HIGH (ref 0.8–1.2)
POC PT: 17.5 s — ABNORMAL HIGH (ref 9.6–17)

## 2016-02-28 NOTE — Progress Notes (Signed)
INR RANGE: (2.0-3.0)    Here for initial anticoagulation visit. Titration of warfarin for aoritc valve disorder. Warfarin started 8/12. Have dosing history starting 8/31.    Subtherapuetic POC INR 1.5.   Patient has taken warfarin 26mg  weekly in last 7 days.   Change warfarin to 30mg  weekly(3mg  daily except 6mg  Mon/Wed/Fri) and RTC and check INR in 3 days on 9/21@8 :30.    [facility] Coumadin Flowsheet INR   Latest Ref Rng & Units 0.9 - 1.2   02/26/2016 2.8 (H)   02/25/2016 2.8 (H)   02/24/2016 2.0 (H)   02/23/2016 1.8 (H)   02/22/2016 1.9 (H)   02/21/2016 1.8 (H)   02/20/2016 1.2   02/19/2016 2.0 (H)   02/18/2016 3.1 (H)   02/17/2016 2.8 (H)   02/16/2016 3.8 (H)   02/16/2016 3.6 (H)         Warfarin Counseling:    Anticoagulation Counseling: Accepted Counseling    1. Patient has been educated why warfarin was prescribed, how it works and the importance of taking warfarin as instructed: Yes    2. Patient has been educated on the side effects of warfarin including risk of bleeding, signs of bleeding and what to do if they are having any bleeding: Yes    3. Patient has been educated on the importance of regular PT/INR monitoring and what his/her target INR will be. Diet and medication can affect the PT/INR level: Yes    4. Patient has been instructed that warfarin has many drug interactions and that he/she should notify their provider or pharmacist who is managing warfarin prior to taking any new medications or discontinuing any mediations (including OTC drugs and herbal products): Yes    5. Patient has been educated on the effect of vitamin K on warfarin, about foods that are high in vitamin K and the importance of a diet that contains a consistent amount of vitamin K and not to avoid foods with vitamin K. Avoid major changed in dietary habits, or notify health professionals before changing habits: Yes    6. Women of childbearing age are informed that warfarin is contraindicated in pregnancy: No    7. Patient has been instructed to take  warfarin at the same time every day and what to do if he/she misses a dose: Yes.    8. Patient has been instructed to notify their healthcare provider managing their warfarin if he/she has diarrhea, fever, lack of appetite, or infection that lasts more than 48 hours: Yes.    9. Patient has been instructed to seek medical attention for serious falls, especially if an injury is sustained to the head: Yes    10. Patient has been educated that he or she should carry identification or wear a bracelet that says he/she is taking warfarin: Yes    11. Patient has been given written information to supplement verbal education (Handouts: Basic Coumadin Information and Coumadin Therapy and Diet): Yes

## 2016-03-01 NOTE — Telephone Encounter (Signed)
PATIENT INFORMATION   Hospital: [facility]  Disposition: Home or Self Care  Patient was admitted for:   Aortic valve disorders AORTIC VALVE DISORDER 02/24/2016    . Aortic valve disorder AORTIC VALVE DISORDER 02/17/2016   . Impaired gait and mobility ABNORMAL GAIT 02/17/2016   . Impaired activities of daily living ACTIVITY OF DAILY LIVING (ADL) ALTERATION 02/17/2016   . Peripheral edema PERIPHERAL EDEMA 02/17/2016   . Ischemia of digits of hands and feet UPPER LIMB ISCHEMIA 02/17/2016   . Blood loss anemia ANEMIA DUE TO BLOOD LOSS 02/17/2016   . Hypokalemia HYPOKALEMIA 02/17/2016   . Acute renal failure with tubular necrosis (CMS/HCC) ACUTE RENAL FAILURE SYNDROME 02/13/2016   . Heparin induced thrombocytopenia (CMS/HCC) HEPARIN-INDUCED THROMBOCYTOPENIA 02/02/2016   . S/P AVR (aortic valve replacement) HISTORY OF AORTIC VALVE REPLACEMENT 01/27/2016   . Non-ischemic cardiomyopathy (CMS/HCC) CARDIOMYOPATHY 01/23/2016   . Respiratory failure (CMS/HCC) RESPIRATORY FAILURE 01/23/2016   . Aortic stenosis, severe AORTIC VALVE STENOSIS 01/22/2016   . Acute systolic heart failure (CMS/HCC) ACUTE SYSTOLIC HEART FAILURE 01/22/2016   . Heart failure (CMS/HCC) HEART FAILURE 01/21/2016   . Elevated brain natriuretic peptide (BNP) level HORMONE LEVEL - FINDING 01/21/2016   . NSTEMI (non-ST elevated myocardial infarction) (CMS/HCC) ACUTE NON-ST SEGMENT ELEVATION MYOCARDIAL INFARCTION 01/21/2016   . Demand ischemia of myocardium (CMS/HCC) ACUTE ISCHEMIC HEART DISEASE 01/21/2016   . Tachycardia TACHYCARDIA 01/21/2016   . Pleural effusion PLEURAL EFFUSION 01/21/2016   . Obesity OBESITY 01/21/2016   . Acute respiratory failure with hypoxia (CMS/HCC) ACUTE RESPIRATORY FAILURE 01/21/2016   . Hypotension LOW BLOOD PRESSURE 01/21/2016   . Iron deficiency anemia IRON DEFICIENCY ANEMIA 01/21/2016   . Thrombocytosis (CMS/HCC) THROMBOCYTOSIS 01/21/2016   . Weight loss WEIGHT DECREASED 01/21/2016   . Family history of  cancer FAMILY HISTORY OF CANCER 01/21/2016   . Cardiomegaly CARDIOMEGALY 01/21/2016   . Mediastinal lymphadenopathy MEDIASTINAL LYMPHADENOPATHY 01/21/2016   . Hilar lymphadenopathy- bilataeral  HILAR LYMPHADENOPATHY 01/21/2016   . Chest pain CHEST PAIN 01/21/2016   . Dyspnea and respiratory abnormality DYSPNEA 01/21/2016   . Abnormal EKG ELECTROCARDIOGRAM ABNORMAL 01/21/2016   . Opacity of lung on imaging study ABNORMAL FINDINGS ON DIAGNOSTIC IMAGING OF LUNG 01/21/2016   . Liver lesion- small hypodense lesion around falciform lig     Contact made on 03/01/2016 by Lajean Saver.    PATIENT ASSESSMENT   How is the patient feeling since being discharged from the hospital?: Patient reported she feels she is doing well at this time. Pain is well managed. She stated she is using Nitroglycerin oint on R third toe and R index finger as instructed and she has noticed a return in color.  She stated she has R knee discomfort mainly at night- she feels this may be arthritic. She takes Tylenol 1 tab po qhs and this is effective. Denies warmth, swelling, or pain in LE. She stated there is an intermittent ache in R Knee.   Were discharge instructions gone over with you prior to discharge?: Yes     Does the patient have the support needed at home?: Yes, patient has support at home.  Comments: Sister and husband have been helping her at home.   Was the patient seen for one of the following conditions?: AVR    APP POST VISIT ADDL QUESTIONS 03/01/2016   Has the patient had any change in peripheral edema? Yes   Comment Mild LE edema. Elevating LE prn.    Does the patient weigh  themselves daily? Yes, weights daily. No weight gain noted.   Does the patient have any difficulty with breathing and/or chest tightness? No   Comments She is speaking clearly and easily understandable.    Comment Per patient she was instructed to wear LifeVest at all times. She confirmed she is wearing this.    Is the patient on blood thinner?  Yes   Who is  managing? [facility].    Comment She has an OV tomorrow w/[facility]. Denies s/s of bleeding. She stated she had bruising to [facility] from IV during hospital. Bruising has resolved.    How is the patient's incision healing? Pt stated incision site looks excellent. Denies s/s of infection, n/v or fever. Pt voiced understanding to contiung to monitor for s/s of infection.      MEDICATIONS   Does patient understand what medication they are supposed to be taking?: Yes  Has patient filled all medications prescribed at discharge?: Yes, all medication prescribed at [facility] were filled.     Is the patient on Coumadin?: Yes  Comments: Refer to above message.   Any questions about medications and/or side effects?: Patient denies medication questions and/or side effects.       FOLLOW UP   Does patient have follow up appointment with PCP?: Yes     Is the follow up with PCP within 3-5 days of discharge?: Within 5 days      Future Appointments  Date Time Provider Department Center   03/02/2016 8:30 AM [facility] ANTICOAG/[facility] ROOM 2 [facility]   03/03/2016 8:00 AM [name], DO [id number] Clin[facility]   03/08/2016 10:15 AM Osvaldo Angst, NP [facility] APP Clinics     Does patient have follow up with a specialist?: Yes   Please refer to above appt list.   Comments: Reviewed AVS w/patient for f/u appts.   Does patient need help with tranpsportation to appointments?: Denies transportation needs at this time.      If home care services were ordered at the time of discharge, have they made contact with the patient?: No  Comments: Patient lives to far out for [facility] to visit home.     COMPLETION OF CALL   Are there any further questions or concerns the patient has prior to completing the call?: Denies any questions or concerns at this time.  Comments: She voiced understanding to return call with any additional questions or concerns. Confirmed with patient she is aware of our location, hrs of operation and phone number.

## 2016-03-01 NOTE — Telephone Encounter (Signed)
PSR ACTION:     PSR please confirm upcoming TCM appt then transfer to [id number]. Thank you!      Left message for patient to return call, if patient returns call please transfer to [id number].

## 2016-03-01 NOTE — Telephone Encounter (Signed)
Confirmed upcoming appointment. Patient is returning clinic's call. Attempted to reach clinic, no answer. Please call back at [phone]

## 2016-03-02 ENCOUNTER — Ambulatory Visit: Admit: 2016-03-02 | Discharge: 2016-03-02

## 2016-03-02 DIAGNOSIS — Z5181 Encounter for therapeutic drug level monitoring: Secondary | ICD-10-CM

## 2016-03-02 LAB — POCT INR
POC INR: 1.7 — ABNORMAL HIGH (ref 0.8–1.2)
POC PT: 20.7 s — ABNORMAL HIGH (ref 9.6–17)

## 2016-03-02 LAB — PROTIME-INR: INR: 1.7 — AB (ref <=1.1)

## 2016-03-02 NOTE — Progress Notes (Signed)
INR RANGE: (2.0-3.0)    Subtherapeutic POC INR 1.7 during titration.  Denies missed doses.  Increase warfarin 10% to 33mg  weekly (6mg  daily except 3mg  M/W/F).   RTC and check INR in 4 days on 9/25@1 :30.    [facility] Coumadin Flowsheet INR   Latest Ref Rng & Units 0.9 - 1.1   02/28/2016 1.5 (External Abn)   02/26/2016 2.8 (H)   02/25/2016 2.8 (H)   02/24/2016 2.0 (H)   02/23/2016 1.8 (H)   02/22/2016 1.9 (H)   02/21/2016 1.8 (H)   02/20/2016 1.2   02/19/2016 2.0 (H)   02/18/2016 3.1 (H)   02/17/2016 2.8 (H)   02/16/2016 3.8 (H)   02/16/2016 3.6 (H)   02/16/2016 4.2 (H)

## 2016-03-03 ENCOUNTER — Ambulatory Visit: Admit: 2016-03-03 | Discharge: 2016-03-03 | Payer: MEDICAID | Attending: DO

## 2016-03-03 DIAGNOSIS — I359 Nonrheumatic aortic valve disorder, unspecified: Secondary | ICD-10-CM

## 2016-03-03 NOTE — Progress Notes (Signed)
Subjective   Chief Complaint:  Chief Complaint   Patient presents with   . Transition Care Management (Tcm)     multiple severe issues including HIT, atrial fib, ARF, CHF, Non-STEMI, severe blood-loss anemia (thought to be from GI source), dysphagia, persistent pulmonary insufficiency, and general weakness and deconditioning.           History of Present Illness:  Hospital Follow-Up: Brandy [name] is a 60 y.o. female who is here today after being discharged from [facility]. She was discharged 6 days  ago. The main problem requiring admission was Aortic valve replacement, heparin-induced thrombocytopenia, atrial fibrillation, acute renal failure, CHF and non-STEMI MI. The Discharge summary and/or Transition of Care Management document was reviewed. Medication reconciliation has been performed as indicated below. The day 2 post Discharge Transitional Care Management encounter was reviewed.     Post discharge course:  60 year old female recently discharged from hospital following aortic valve replacement. Patient's hospitalization complicated by heparin-induced thrombocytopenia with atrial fibrillation acute renal failure heart failure and non-STEMI. Patient does have outpatient cardiac follow-up. She currently has a external heart monitor and defibrillation device. Has done fairly well since discharge. She is currently compliant with all of her medications (aspirin, Lipitor, Coreg, Lasix, nitroglycerin ointment and Coumadin.)  She also had complication of ischemic toes right foot and right hand. She is given a wound care referral today. Instructed to continue nitroglycerin paste as instructions at discharge.  States that her appetite has returned. Denies chest pains and shortness of breath. States bowel habits are normal.       Objective   Vitals:    03/03/16 0810   BP: 112/90   Pulse: 99   Temp: 37.2 ?C (98.9 ?F)   SpO2: 97%     Body mass index is 32.44 kg/(m^2).  History   Smoking Status   . Never Smoker   Smokeless  Tobacco   . Never Used       Current Outpatient Prescriptions:   .  acetaminophen (TYLENOL) 650 MG CR tablet, Take 650 mg by mouth every 8 hours as needed for Pain., Disp: , Rfl:   .  aspirin 81 MG EC tablet, Take 1 tablet by mouth daily., Disp: , Rfl:   .  atorvastatin (LIPITOR) 20 MG tablet, Take 1 tablet by mouth every night at bedtime., Disp: 30 tablet, Rfl: 1  .  carvedilol (COREG) 6.25 MG tablet, Take 1 tablet by mouth 2 times daily with meals., Disp: 60 tablet, Rfl: 1  .  furosemide (LASIX) 40 MG tablet, Take 1 tablet by mouth daily., Disp: 30 tablet, Rfl: 0  .  nitroglycerin (NITROSTAT) 2 % ointment, Place 0.1 inches onto the skin 3 times daily., Disp: 10 packet, Rfl: 0  .  potassium chloride SA (K-DUR,KLOR-CON) 20 MEQ tablet, Take 1 tablet by mouth daily., Disp: 30 tablet, Rfl: 0  .  warfarin (COUMADIN) 3 MG tablet, Take 1 tablet by mouth daily., Disp: 30 tablet, Rfl: 1  Review of Systems   Constitutional: Negative for fever, malaise/fatigue and weight loss.   HENT: Negative for congestion, ear pain and sore throat.    Eyes: Negative for blurred vision.   Respiratory: Negative for cough and shortness of breath.    Cardiovascular: Negative for chest pain, palpitations and leg swelling.   Gastrointestinal: Negative for abdominal pain.   Genitourinary: Negative for dysuria.   Musculoskeletal: Negative for back pain, falls and myalgias.   Skin: Negative for rash.   Neurological: Negative for dizziness, tingling,  tremors, focal weakness and headaches.     Physical Exam   Constitutional: She is oriented to person, place, and time and well-developed, well-nourished, and in no distress.   HENT:   Head: Normocephalic.   Eyes: Conjunctivae and EOM are normal. Pupils are equal, round, and reactive to light. Right eye exhibits no discharge. Left eye exhibits no discharge.   Neck: Normal range of motion. No thyromegaly present.   Cardiovascular: Normal rate, regular rhythm, normal heart sounds and intact distal pulses.     No murmur heard.  Ischemic demarcation third toe right foot, second distal right finger.   Pulmonary/Chest: No respiratory distress. She has no wheezes. She has no rales. She exhibits no tenderness.   Anterior chest wall surgical scar well-healed   Abdominal: Soft. Bowel sounds are normal. There is no tenderness.   Musculoskeletal: Normal range of motion. She exhibits edema.   Lymphadenopathy:     She has no cervical adenopathy.   Neurological: She is alert and oriented to person, place, and time. No cranial nerve deficit. Gait normal.   Skin: Skin is warm and dry. She is not diaphoretic.   Psychiatric: Affect normal.          Assessment   1. Aortic valve disorders  Patient currently fully anticoagulated. She will continue Coumadin as directed at Coumadin clinic. She does have cardiac follow-up in 2-3 weeks.  - CBC with Auto Differential -Routine; Future  - Comprehensive Metabolic Panel -Routine; Future  - Coronary Risk Lipid Panel w/reflex Direct LDL if triglycerides >400 mg/dL -Routine; Future    2. S/P AVR (aortic valve replacement)  See # 1  - CBC with Auto Differential -Routine; Future    3. Ischemia of digits of hand    - Referral to Wound Clinic ([place])  -Continue nitroglycerin paste to digits daily  4. 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 NSTEMI (non-ST elevated myocardial infarction) (CMS/HCC)  - Comprehensive Metabolic Panel -Routine; Future  - Coronary Risk Lipid Panel w/reflex Direct LDL if triglycerides >400 mg/dL -Routine; Future  -Stable on beta blocker and statin.  6. HIT (heparin-induced thrombocytopenia) (CMS/HCC)    - CBC with Auto Differential -Routine; Future    7. Peripheral edema  Continue Lasix 40 mg daily    8. Blood loss anemia  Will be notified of laboratory results when available  - CBC with Auto Differential -Routine; Future    9. Acute renal failure with tubular necrosis (CMS/HCC)  Will  be notified of laboratory results when available  - Comprehensive Metabolic Panel -Routine; Future    10. Passive smoke exposure    11. BMI 32.0-32.9,adult  Dash diet recommended  Medical Decision Making:  61 year old female recently discharged from hospital following aortic valve replacement. Patient's hospitalization complicated by heparin-induced thrombocytopenia with atrial fibrillation acute renal failure heart failure and non-STEMI. Patient does have outpatient cardiac follow-up. She currently has a external heart monitor and defibrillation device. Has done fairly well since discharge. She is currently compliant with all of her medications (aspirin, Lipitor, Coreg, Lasix, nitroglycerin ointment and Coumadin.)  She also had complication of ischemic toes right foot and right hand. She is given a wound care referral today. Instructed to continue nitroglycerin paste as instructions at discharge.  States that her appetite has returned. Denies chest pains and shortness of breath. States bowel habits are normal.   She is instructed to keep  the wound care follow-up. Continue medication instructions as per discharge summary. Continue outpatient Coumadin monitoring at clinic. Keep cardiac follow-up. Be notified of laboratory results ordered today. Follow-up 3 months or as needed.         Follow up: 3 months or as needed, keep outpatient cardiac follow-up 2-3 weeks.       TRANSITIONAL CARE MANAGEMENT CERTIFICATION:  I certify the following are true:  1. Communication and/or attempts to communicate with the patient was made within 2 business days of discharge.  2. Complexity of Medical decision making is high.  3. Face to face visit occurred within 6 days of discharge.

## 2016-03-03 NOTE — Patient Instructions (Signed)
Keep cardiology follow-up    Will be notified of laboratory results when available    Keep follow-up with wound care    Return to clinic 3 months or as needed.

## 2016-03-06 ENCOUNTER — Ambulatory Visit: Admit: 2016-03-06 | Discharge: 2016-03-06

## 2016-03-06 ENCOUNTER — Inpatient Hospital Stay: Admit: 2016-03-06 | Attending: Surgical

## 2016-03-06 ENCOUNTER — Other Ambulatory Visit: Admit: 2016-03-06

## 2016-03-06 DIAGNOSIS — Z5181 Encounter for therapeutic drug level monitoring: Secondary | ICD-10-CM

## 2016-03-06 DIAGNOSIS — Z952 Presence of prosthetic heart valve: Secondary | ICD-10-CM

## 2016-03-06 LAB — CELLAVISION DIFFERENTIAL
Basophils %: 3 % — ABNORMAL HIGH (ref 0–2)
Basophils, Absolute: 0.2 10*3/ÂµL — ABNORMAL HIGH (ref 0.0–0.1)
Eosinophils %: 4 % (ref 0–5)
Eosinophils, Absolute: 0.3 10*3/ÂµL (ref 0.0–0.4)
Lymphocytes %: 37 % (ref 20–40)
Lymphocytes, Absolute: 2.5 10*3/ÂµL (ref 1.0–4.0)
Monocytes %: 5 % (ref 2–12)
Monocytes, Absolute: 0.3 10*3/ÂµL (ref 0.2–1.0)
Neutrophils %: 52 % (ref 50–74)
Neutrophils, Absolute: 3.5 10*3/ÂµL (ref 2.0–7.4)
Platelet Estimate: ADEQUATE
Total Cells Counted: 100
WBC: 6.7 10*3/ÂµL (ref 4.5–11.0)

## 2016-03-06 LAB — CBC WITH AUTO DIFFERENTIAL
HCT: 37.5 % (ref 35.0–48.0)
Hemoglobin: 12.1 g/dL (ref 11.7–16.5)
MCH: 28.3 pg (ref 28.3–33.3)
MCHC: 32.3 g/dL — ABNORMAL LOW (ref 32.5–36.0)
MCV: 87.7 fL (ref 81.0–100.0)
MPV: 7.8 fL (ref 6.9–10.0)
Platelet Count: 305 10*3/ÂµL (ref 150–405)
RBC: 4.28 10*6/ÂµL (ref 3.80–5.60)
RDW: 25.2 % — ABNORMAL HIGH (ref 11.7–16.1)
WBC: 6.7 10*3/ÂµL (ref 4.5–11.0)

## 2016-03-06 LAB — BASIC METABOLIC PANEL
Anion Gap: 18 mmol/L (ref 12.0–20.0)
BUN / Creatinine Ratio: 19.5 (ref 12.0–20.0)
BUN: 15 mg/dL (ref 8–20)
CO2 - Carbon Dioxide: 22 mmol/L (ref 17–27)
Calcium: 11.1 mg/dL — ABNORMAL HIGH (ref 8.6–10.6)
Chloride: 104 mmol/L (ref 101.0–111.0)
Creatinine: 0.77 mg/dL (ref 0.60–1.30)
Glomerular Filtration Rate Estimate: >60 mL/min/{1.73_m2} (ref >=60.0)
Glucose: 98 mg/dL (ref 74–106)
Osmolality Calculation: 280 mOsm/kg (ref 275.0–300.0)
Potassium: 4 mmol/L (ref 3.50–5.10)
Sodium: 140 mmol/L (ref 135–145)

## 2016-03-06 LAB — POCT INR
POC INR: 1.9 — ABNORMAL HIGH (ref 0.8–1.2)
POC PT: 23.3 s — ABNORMAL HIGH (ref 9.6–17)

## 2016-03-06 LAB — PROTIME-INR: INR: 1.9 — AB (ref <=1.1)

## 2016-03-06 MED ORDER — warfarin (COUMADIN) 3 MG tablet
3 | ORAL_TABLET | Freq: Every day | ORAL | 1 refills | 62.00000 days | Status: DC
Start: 2016-03-06 — End: 2016-03-12

## 2016-03-06 NOTE — Telephone Encounter (Signed)
-----   Message from [name], DO sent at 03/06/2016  3:31 PM PDT -----  Results H and H improved, WBC 6.7 .Please notify patient.

## 2016-03-06 NOTE — Telephone Encounter (Signed)
-----   Message from [name], DO sent at 03/06/2016  3:45 PM PDT -----  Results eGFR normal  .Please notify patient.

## 2016-03-06 NOTE — Progress Notes (Signed)
INR RANGE: (2.0-3.0)    Subtherapeutic POC INR 1.9 during titration.  Denies missed doses.   Patient has had 36mg  in the last week.  Increase warfarin to 39mg  weekly (6mg  daily except 3mg  Wed).   RTC and check INR in 7 on 10/2@1 :30.      [facility] Coumadin Flowsheet INR   Latest Ref Rng & Units 0.9 - 1.1   03/02/2016 1.7 (External Abn)   02/28/2016 1.5 (External Abn)   02/26/2016 2.8 (H)   02/25/2016 2.8 (H)   02/24/2016 2.0 (H)   02/23/2016 1.8 (H)   02/22/2016 1.9 (H)   02/21/2016 1.8 (H)   02/20/2016 1.2   02/19/2016 2.0 (H)   02/18/2016 3.1 (H)   02/17/2016 2.8 (H)   02/16/2016 3.8 (H)   02/16/2016 3.6 (H)   02/16/2016 4.2 (H)   02/15/2016 4.0 (H)

## 2016-03-06 NOTE — Telephone Encounter (Signed)
Notified patient of results below.

## 2016-03-08 ENCOUNTER — Encounter: Admit: 2016-03-08 | Discharge: 2016-03-08 | Payer: MEDICAID | Attending: Family

## 2016-03-08 ENCOUNTER — Inpatient Hospital Stay: Admit: 2016-03-08 | Payer: MEDICAID

## 2016-03-08 ENCOUNTER — Inpatient Hospital Stay
Admit: 2016-03-08 | Discharge: 2016-03-13 | Disposition: A | Discharge status: Home / Self Care | Payer: MEDICAID | Source: Ambulatory Visit | Attending: MD | Admitting: MD

## 2016-03-08 DIAGNOSIS — I96 Gangrene, not elsewhere classified: Secondary | ICD-10-CM

## 2016-03-08 LAB — VRE CULTURE

## 2016-03-08 LAB — MRSA BY PCR: MRSA DNA Result: NOT DETECTED

## 2016-03-08 LAB — BLOOD CULTURE
Blood Culture Result: NO GROWTH
Blood Culture Result: NO GROWTH

## 2016-03-08 MED ORDER — aspirin 81 MG chewable tablet 81 mg
81 | Freq: Every day | ORAL | Status: DC
Start: 2016-03-08 — End: 2016-03-10
  Administered 2016-03-09: 17:00:00 81 mg via ORAL

## 2016-03-08 MED ORDER — ondansetron (ZOFRAN) injection 4 mg
4 | Freq: Four times a day (QID) | INTRAMUSCULAR | Status: DC | PRN
Start: 2016-03-08 — End: 2016-03-12

## 2016-03-08 MED ORDER — acetaminophen (TYLENOL) tablet 325 mg
325 | ORAL | Status: DC | PRN
Start: 2016-03-08 — End: 2016-03-11
  Administered 2016-03-11: 03:00:00 325 mg via ORAL

## 2016-03-08 MED ORDER — levofloxacin (LEVAQUIN) 500 mg in D5W IVPB Premix
500 | INTRAVENOUS | Status: DC
Start: 2016-03-08 — End: 2016-03-10
  Administered 2016-03-08: 23:00:00 500 mg via INTRAVENOUS
  Administered 2016-03-09: 500100 mg via INTRAVENOUS
  Administered 2016-03-09: 23:00:00 500 mg via INTRAVENOUS
  Administered 2016-03-10: 01:00:00 500100 mg via INTRAVENOUS

## 2016-03-08 MED ORDER — atorvastatin (LIPITOR) tablet 20 mg
20 | Freq: Every evening | ORAL | Status: DC
Start: 2016-03-08 — End: 2016-03-12
  Administered 2016-03-09 – 2016-03-12 (×4): 20 mg via ORAL

## 2016-03-08 MED ORDER — famotidine (PF) (PEPCID) 20 mg in 0.9% NaCl IVPB Premix
20 | Freq: Two times a day (BID) | INTRAVENOUS | Status: DC
Start: 2016-03-08 — End: 2016-03-10
  Administered 2016-03-09: 18:00:00 2050 mg via INTRAVENOUS
  Administered 2016-03-09: 17:00:00 20 mg via INTRAVENOUS
  Administered 2016-03-09: 06:00:00 2050 mg via INTRAVENOUS
  Administered 2016-03-09: 05:00:00 20 mg via INTRAVENOUS
  Administered 2016-03-10: 05:00:00 2050 mg via INTRAVENOUS
  Administered 2016-03-10 (×2): 20 mg via INTRAVENOUS
  Administered 2016-03-10: 20:00:00 2050 mg via INTRAVENOUS

## 2016-03-08 MED ORDER — docusate sodium (COLACE) capsule 250 mg
250 | Freq: Every day | ORAL | Status: DC
Start: 2016-03-08 — End: 2016-03-12
  Administered 2016-03-08 – 2016-03-12 (×4): 250 mg via ORAL

## 2016-03-08 MED FILL — LEVOFLOXACIN IN D5W 500 MG/100 ML IVPB PREMIX: 500 mg/dL | INTRAVENOUS | Qty: 100

## 2016-03-08 MED FILL — ASPIRIN 81 MG CHEWABLE TABLET: 81 mg | ORAL | Qty: 1

## 2016-03-08 MED FILL — STOOL SOFTENER 250 MG CAPSULE: 250 mg | ORAL | Qty: 1

## 2016-03-08 NOTE — H&P (Signed)
CARDIOTHORACIC SURGERY ADMISSION    REASON FOR ADMISSION: Toe infection     CHIEF COMPLAINT: toe pain and foul smell    HISTORY OF PRESENT ILLNESS:    The patient is a 60 y.o. female with the below medical co-morbid conditions.      History of present illness:  Patient is well known to me she underwent AVR with 23mm tissue valve.  She had a very long post op course.  She required intra-aortic balloon pump to disconnect from cardiopulmonary bypass.  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  She had necrosis of her digits.  She was discharged after many weeks to rehab and did well.  She has had autonecrosis of the finger but her middle right toe appears to be progressing to wet gangrene.    Patient complains of more pain. 5/10 severity with walking better with rest.  There is now a foul odor which is worse in past few days.  She denies fever or chills.  Given new valve the risk is high.  She is admitted for workup and removal of the toe is likely needed.  ?  REVIEW OF SYSTEMS  Review of Systems   Constitution: Negative for chills, weakness and malaise/fatigue.   HENT: Negative for hoarse voice.    Eyes: Negative for photophobia.   Cardiovascular: Negative for chest pain and leg swelling.   Respiratory: Negative for shortness of breath and wheezing.    Endocrine: Negative for polydipsia.   Skin: Positive for suspicious lesions.   Musculoskeletal: Positive for joint pain.   Gastrointestinal: Negative for abdominal pain.   Genitourinary: Negative for dysuria.   Psychiatric/Behavioral: Negative for depression. The patient does not have insomnia.    All other systems reviewed and are negative.      PAST MEDICAL HISTORY  Past Medical History:   Diagnosis Date   . History of blood transfusion    . 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 aspirin  81 mg Oral Daily   . atorvastatin  20 mg Oral Nightly   . docusate sodium  250 mg Oral Daily   . famotidine  20 mg Intravenous 2 times  per day   . levofloxacin (LEVAQUIN) IV  500 mg Intravenous Q24H       ALLERGIES  Heparin and Sulfa (sulfonamide antibiotics)    PAST SURGICAL HISTORY  Past Surgical History:   Procedure Laterality Date   . AORTIC VALVE REPLACEMENT     . PROCEDURE N/A 01/27/2016    Procedure: AORTIC VALVE REPLACEMENT; INTRA AORTIC BALLOON INSERTION;  Surgeon: Otho Bellows, MD;  Location: [facility] OR;  Service: Cardiology;  Laterality: N/A;   . TONSILLECTOMY     . TUBAL LIGATION  1980       FAMILY HISTORY  Family History   Problem Relation Age of Onset   . Cancer Mother    . Heart disease Father    . Heart disease Sister    . Cancer Brother    . Cancer Maternal Aunt    . Heart disease Maternal Aunt    . Cancer Maternal Uncle    . Heart disease Maternal Uncle        SOCIAL HISTORY  Second hand smoke exposure positive    Social History     Social History Main Topics   . Smoking status: Passive Smoke Exposure - Never Smoker   . Smokeless tobacco: Never Used   . Alcohol use No   .  Drug use: No   . Sexual activity: Not Currently     Partners: Male     Social History   . Marital status: Married     Spouse name: N/A   . Number of children: N/A   . Years of education: N/A     Other Topics Concern   . None       Physical Examination:    Pulse 97  Temp 37.1 ?C (98.7 ?F) (Oral)   Resp 16  Ht 5' 6 (1.676 m)  SpO2 98% No flowsheet data found.  GENERAL:  Alert, oriented, conversant, female  HEENT:  Extraocular movements are intact.  Pupils are reactive.  Oropharyngeal membranes are pink and moist.   NECK:  No adenopathy.  No bruits  LUNGS:  cta  CARDIAC:  Regular rate and rhythm.  No murmur  VASCULAR:  Radial pulses are symmetric.  There are no carotid bruits.  ABDOMEN:  Soft, nontender.  No gross organomegaly.    EXTREMITIES:  Middle digit right foot with erythema and wet necrosis   NEUROLOGIC:  No lateralizing weakness, sensory loss to gross observation and her is stable and symmetric  PSYCHIATRIC:  Mood, memory, and cognitive function  appears normal to gross observation and discussion.  ENDOCRINE:  No polyuria or polydipsia.  No goiter is noted.    Diagnostic Studies:    1. Echocardiogram order      5. Labs   Lab Results   Component Value Date    NA 140 03/06/2016    K 4.00 03/06/2016    CL 104.0 03/06/2016    CO2 22 03/06/2016    GLU 98 03/06/2016    BUN 15 03/06/2016    CREATININES 0.77 03/06/2016    CALCIUM 11.1 (H) 03/06/2016    AST 25 02/17/2016    ALT 20 02/17/2016    ALKPHOS 88 02/17/2016    BILITOT 0.90 01/21/2016    ALBUMIN 2.3 (L) 02/17/2016    ALBUMIN 2.3 (L) 02/17/2016    GLOB 2.9 02/17/2016    AGRATIO 0.8 (L) 02/17/2016    ANIONGAP 18.0 03/06/2016    LABGLOM >60.0 03/06/2016    WHITEBLOODCE 6.7 03/06/2016    WHITEBLOODCE 6.7 03/06/2016    LABPLAT 305 03/06/2016    HGB 12.1 03/06/2016    PROTIME 31.9 (H) 02/26/2016    INR 1.9 (External Abn) 03/06/2016    APTT 38 (H) 02/17/2016         6. CT SCAN/XRAY none    Impression: 59yo with infected middle digit right foot    Decision Making:needs workup     Plan:  Xray   Blood cultures  Echo  abx    Podiatry consult   Discussed case with Kathi Ludwig MD  Cardiothoracic Surgery

## 2016-03-08 NOTE — Progress Notes (Signed)
Wound Care Note:  New referral for necrotic toes, finger, and extravasation site   Patient name: Brandy [name] Room: 6484/6484-01 Time: 3:05 PM   Date of Birth: Aug 21, 1955  Date: 03/08/2016     Gender: female   MRN: [id number]   Attending: Otho Bellows, MD     Admitting Dx: Gangrene of Foot;Gangrene of foot (CMS/HCC);Gangrene of foo*         History/Wound or Surgery History /Comorbidities: chart reviewed. Patient underwent an aortic valve replacement on 01/27/16 with Dr. Debroah Baller. She developed heparin induced thrombocytopenia with necrosis of toes and fingers. She had an extravasation injury during the previous admission.       Hospital Problem List as of 03/08/2016        Hospital    * (Principal)Gangrene of foot (CMS/HCC)      Non-Hospital Problem List as of 03/08/2016        Non-Hospital    Heart failure (CMS/HCC)    Elevated brain natriuretic peptide (BNP) level    NSTEMI (non-ST elevated myocardial infarction) (CMS/HCC)    Demand ischemia of myocardium (CMS/HCC)    Tachycardia    Pleural effusion    Obesity    Acute respiratory failure with hypoxia (CMS/HCC)    Hypotension    Iron deficiency anemia    Thrombocytosis (CMS/HCC)    Weight loss    Family history of cancer    Overview     Ovarian and colon cancer          Cardiomegaly    Mediastinal lymphadenopathy    Hilar lymphadenopathy- bilataeral     Chest pain    Dyspnea and respiratory abnormality    Abnormal EKG    Opacity of lung on imaging study    Liver lesion- small hypodense lesion around falciform lig    Aortic stenosis, severe    Acute systolic heart failure (CMS/HCC)    Non-ischemic cardiomyopathy (CMS/HCC)    Respiratory failure (CMS/HCC)    S/P AVR (aortic valve replacement)    Overview     23mm MAGNA edwards         Heparin induced thrombocytopenia (CMS/HCC)    Acute renal failure with tubular necrosis (CMS/HCC)    Overview     Required dialysis          Aortic valve disorder    Impaired gait and mobility    Impaired activities of daily living     Peripheral edema    Ischemia of digits of hands and feet    Blood loss anemia    Hypokalemia    Aortic valve disorders    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 Heparin Anaphylaxis     Possible HIT   . Sulfa (Sulfonamide Antibiotics) Rash     In ICU for 3 days post dose         Braden Scale:      On appropriate surface? Yes, able to reposition well    Labs:   Lab Results   Component Value Date    WHITEBLOODCE 6.7 03/06/2016    WHITEBLOODCE 6.7 03/06/2016    WHITEBLOODCE 6.0 02/24/2016    HGB 12.1 03/06/2016    HGB 10.3 (L) 02/24/2016    HGB 9.4 (L) 02/21/2016    HCT 37.5 03/06/2016    HCT 31.8 (L) 02/24/2016  HCT 29.9 (L) 02/21/2016    MCV 87.7 03/06/2016    MCV 83.2 02/24/2016    MCV 82.0 02/21/2016    LABPLAT 305 03/06/2016    LABPLAT 256 02/24/2016    LABPLAT 225 02/21/2016       No results for input(s): SEDRATE in the last 72 hours.  No results for input(s): ALBUMIN in the last 72 hours.  No results for input(s): CRP in the last 72 hours.  There is no height or weight on file to calculate BMI.  Nutritional Status:  High BMI    Neuro Assessment: awake, alert, cooperative with care    Fall Risk: Score: 40  Fall Risk: Medium  Precautions Observed     Isolation Status:  standard precautions observed    Pain - Prior: 3  During: 5  After: 3    Family Present: husband    Last dressing change: n/a     03/08/16 1400   Wounds and Other Ulcers 03/08/16 Extravasation Arm Right forearm   Date First Assessed/Time First Assessed: 03/08/16 1400   Wound Type: Extravasation  Location: Arm  Wound Location Orientation: Right  Wound Description (Comments): forearm  Pre-existing: Yes   State of Healing Non-healing   Wound Assessment Eschar   Length (longest length head to toe) 6.0   Width (widest width perpendicular to length) 3.0   Edges / Margins attached   Peri-wound Assessment intact   Drainage Amount None   Treatments Debrided: Lambert Mody;Pharmaceutical  agent   Dressing Povidone-iodine, 10%   Wounds and Other Ulcers 03/08/16 Arterial/Ischemic Toe (Comment  which one) Right;Distal toes   Date First Assessed/Time First Assessed: 03/08/16 1454   Wound Type: Arterial/Ischemic  Location: Toe (Comment  which one)  Wound Location Orientation: Right;Distal  Wound Description (Comments): toes  Pre-existing: Yes   State of Healing Non-healing   Wound Assessment Eschar  (loose epithelium)   Length (longest length head to toe) 3.0  (total approximate area on toes)   Width (widest width perpendicular to length) 6.0   Depth (deepest part of visible wound) 0.2   Edges / Margins not attached   Peri-wound Assessment erythemic  (2nd toe erythemic but others intact)   Drainage Amount Scant   Drainage Description Serous   Treatments Cleansed;Debrided: Sharp;Pharmaceutical agent   Dressing Povidone-iodine, 10%;Alginate   Dressing Status Dressing Changed   Wounds and Other Ulcers 03/08/16 Arterial/Ischemic Finger (Comment which one) Right index   Date First Assessed/Time First Assessed: 03/08/16 1456   Wound Type: Arterial/Ischemic  Location: Finger (Comment which one)  Wound Location Orientation: Right  Wound Description (Comments): index  Pre-existing: Yes   State of Healing Non-healing   Wound Assessment Eschar   Length (longest length head to toe) 2.0   Width (widest width perpendicular to length) 2.0   Edges / Margins attached   Peri-wound Assessment intact   Drainage Amount None   Treatments Debrided: Sharp;Pharmaceutical agent   Dressing Povidone-iodine, 10%     Debridement:   Conservative sharp debridement using:  scissors was used to debride necrotic eschar and loose non-viable epithelium from all wounds without bleeding.    Treatment/Rationale: All loose epithelium and necrotic tissue removed. Treated with betadine swaps. The toes received Kaltostat between the toes to absorb and decrease any drainage.       Education:    Who: patient, family and care provider      What  reviewed: S&S of infection and Wound Care    Pt understanding: good  Coordination of  Care with: nurse    Impression: forearm and finger eschars are intact and stable. Second toe is draining and unstable. 3rd toe is mostly stable but moist on medial side. 4th toe is moist and draining. 5th toe appears stable and intact eschar.    Recommendations:   Dressing changes to be done daily by nursing staff.  Wound Care team to follow-up 1-2x week.                    Billable Items: Kaltostat

## 2016-03-08 NOTE — Progress Notes (Signed)
PHARMACIST PROGRESS NOTE     The following pharmacy services were provided: anti-infective interventions    SUBJECTIVE / OBJECTIVE     Brandy [name] is [name] admitted on 03/08/2016.    No interview was performed today.    I have reviewed available relevant labs, vitals and flowsheets today.    ASSESSMENT       Anti-infective Interventions  Indication(s):  Right middle toe infection pending podiatrist evaluation and possible amputation 9/28. In setting of recent AVR.   Current anti-infective(s):  Levofloxacin 500mg  IV daily.  Relevant previous anti-infective(s):  Cipro as an outpatient.  Days of therapy appropriate for indication:  Day #1  Duration per literature:  Amputation:  Pathogen specific therapy for 24-48 hours post amputation.  If sepsis syndrome present or bacteremia defined, treat longer and follow recommendations in that pathogen's module.  If not all infected bone removed, then treat 4-6 weeks of pathogen directed therapy  Reference(s):  [facility]       Anti-infective Interventions  Monitor cultures.  Continue current dosage of levofloxacin.           Signed by: Drucie Ip, RPh

## 2016-03-08 NOTE — Progress Notes (Signed)
RESPIRATORY ASSESSMENT    SUBJECTIVE: States currently not SOB, nonsmoker, no resp disease history       OBJECTIVE:   Current Vital Signs:    Pre and post C/S: CLear   HR:  97   RR:  16              Cough: States she has strong productive cough prior to admit   SpO2:   98% on RA   ABG Results: Ph:   , CO2:     ,PO2:      , HCO3:   Vent/Bipap Settings:    CXR: 03/06/16 [place]. CONCLUSION: Satisfactory postoperative appearance of the chest.  Minimal residual RIGHT infrahilar atelectasis.    ASSESSMENT:  Pt is laying in high fowler position, responds appropriately, no resp distress noticed.        PLAN: No resp intervention needed at this time.        GOAL(S):  Medication:   Mobilize secretions  Decrease airway inflammation  Decrease work of breathing  Minimize or eliminate adventitious breath sounds  Maximize airflow  Maintain home routine    Pulmonary/Bronchial Hygiene:  Improve mucocilliary clearance  Prevent or correct atelectasis    CMV/NIV:  Maintain a patent airway  Normalize and maintain adequate ABG values for patient clinical situation  Prevent or correct atelectasis   Minimize risk of VAP              Maintain Home CMV/NIV routine    Supplemental Oxygen:   Maintain SpO2 >   90%  Normalize and maintain an adequate SpO2/PaO2 appropriate for patient clinical situation    Per PDP # 102 and MD orders    E.S.  Carolynn Sayers, RRT

## 2016-03-08 NOTE — Progress Notes (Signed)
Subjective:  The patient is a 60 y.o. female seen for followup post Aortic Valve replacement with a 23 mm magna Edwards pericardial tissue valve with placement of an intra-aortic balloon pump on January 27, 2016.  Balloon pump was discontinued on postop day 2.   Her hospitalization was characterized by multiple severe medical issues including HIT, atrial fibrillation, Q renal failure, CHF, non-STEMI, severe blood loss anemia (thought to be from a GI source), dysphagia, persistent pulmonary insufficiency, and general weakness and deconditioning.  She ultimately had a 21 day stay postoperative stay became quite deconditioned and was discharged to inpatient rehabilitation.  He tolerated her stay on inpatient rehabilitation very well. She was there for approximately 8 days. She met all of her goals and was then discharged home. Today is her first follow-up visit.   Patient is seen today in the office coming by her husband. She reports that she's been doing very well since her discharge from inpatient rehabilitation. He is feeling much better. Her only complaint is still her toes on her right foot are quite sore and that's the only thing that is limiting her mobility at this point. Is still trying to exercise every day. He feels that she's getting better every day. And feeling well overall. She's not using any pain medications at this point. Her appetite is good. Her bowels are working well.                Objective:  Ht 5' 6 (1.676 m) No flowsheet data found.    Physical Exam:  GENERAL: she appears alert and oriented and in no apparent distress  LUNGS: Clear to auscultation bilaterally; no wheezes heard  HEART: Exam reveals a regular rate and rhythm  SKIN: Her surgical wound appears warm and dry without evidence of infection, inflammation or fluid collection.  Sternum is stable  EXTREMITIES: There is mild edema present.  The tip of Index finger on her right hand is black with dry gangrene.  Digital digit on her right  toe appears necrotic with poossibly wet gangrene.   great toe and the fourth toe are hyperemic but not necrotic.      Diagnostics:    WBC 4.5 - 11.0 10*3/?L 6.7   RBC 3.80 - 5.60 10*6/?L 4.28   Hemoglobin 11.7 - 16.5 g/dL 16.1   HCT 09.6 - 04.5 % 37.5   MCV 81.0 - 100.0 fL 87.7   MCH 28.3 - 33.3 pg 28.3   MCHC 32.5 - 36.0 g/dL 40.9 (L)   RDW 81.1 - 91.4 % 25.2 (H)   Platelet Count 150 - 405 10*3/?L 305   MPV 6.9 - 10.0 fL 7.8     Sodium 135 - 145 mmol/L 140   Potassium 3.50 - 5.10 mmol/L 4.00   Chloride 101.0 - 111.0 mmol/L 104.0   CO2 - Carbon Dioxide 17 - 27 mmol/L 22   Creatinine 0.60 - 1.30 mg/dL 7.82   BUN 8 - 20 mg/dL 15   BUN / Creatinine Ratio 12.0 - 20.0 19.5   Glucose 74 - 106 mg/dL 98   Calcium 8.6 - 95.6 mg/dL 21.3 (H)   Anion Gap 08.6 - 20.0 mmol/L 18.0   Glomerular Filtration Rate Estimate >=60.0 mL/min/1.3m*2 >60.0         CXR 03/06/16:    No acute process identified. When compared to prior x-ray of 02/16/2016 left-sided basilar atelectasis and small pleural effusion appears to have resolved.  Current Outpatient Prescriptions   Medication Sig Dispense Refill   . acetaminophen (TYLENOL) 650 MG CR tablet Take 650 mg by mouth every 8 hours as needed for Pain.     Marland Kitchen aspirin 81 MG EC tablet Take 1 tablet by mouth daily.     Marland Kitchen atorvastatin (LIPITOR) 20 MG tablet Take 1 tablet by mouth every night at bedtime. 30 tablet 1   . carvedilol (COREG) 6.25 MG tablet Take 1 tablet by mouth 2 times daily with meals. 60 tablet 1   . furosemide (LASIX) 40 MG tablet Take 1 tablet by mouth daily. 30 tablet 0   . nitroglycerin (NITROSTAT) 2 % ointment Place 0.1 inches onto the skin 3 times daily. 10 packet 0   . potassium chloride SA (K-DUR,KLOR-CON) 20 MEQ tablet Take 1 tablet by mouth daily. 30 tablet 0   . warfarin (COUMADIN) 3 MG tablet Take 2 tablets by mouth daily. Except one tablet on Wednesday or as directed as SLAC 60 tablet 1     No current facility-administered medications for this visit.          Impression:    Satisfactory progress status post Aortic Valve replacement.  Acute renal failure resolved   HIT  Possible wet gangrenous middle toe on her right foot         Recomendations:      I would like patient to continue to observe sternal precautions not lift more 10 pounds for another 4-6 weeks.  She Is encouraged to continue with her exercise reconditioning program  She may begin driving when she's a month out from her surgery.  I would like her to follow-up with her cardiologist in 3-4 weeks.  Advised her to soak her right foot and a Betadine solution twice a day for 15 minutes at a time.  We'll try to arrange immediate follow-up with a podiatrist for amputation of her right middle toe. We have a high level of concern as it appears like the toe may possibly be developing wet gangrene set in that this could infect her new aortic bioprosthesis.      After speaking with a podiatrist it was decided that we would admit the patient from the office today have her evaluated by podiatry with likely right middle toe amputation tomorrow.  Start her on by mouth Cipro she'll be evaluated later this afternoon.

## 2016-03-09 LAB — PROTIME-INR
INR: 2 — ABNORMAL HIGH (ref 0.9–1.2)
Protime: 22.1 Seconds — ABNORMAL HIGH (ref 10.0–13.0)

## 2016-03-09 MED FILL — FAMOTIDINE (PF) IN 0.9% NACL 20 MG/50 ML IVPB PREMIX: 20 mg/50 mL | INTRAVENOUS | Qty: 50

## 2016-03-09 MED FILL — ASPIRIN 81 MG CHEWABLE TABLET: 81 mg | ORAL | Qty: 1

## 2016-03-09 MED FILL — ATORVASTATIN 20 MG TABLET: 20 mg | ORAL | Qty: 1

## 2016-03-09 MED FILL — LEVOFLOXACIN IN D5W 500 MG/100 ML IVPB PREMIX: 500 mg/dL | INTRAVENOUS | Qty: 100

## 2016-03-09 MED FILL — STOOL SOFTENER 250 MG CAPSULE: 250 mg | ORAL | Qty: 1

## 2016-03-09 NOTE — Consults (Signed)
History and Physical      Name: Brandy [name]  DOB: [birth year] 60 y.o.  MRN: [id number]  CSN: [id number]    History:     Chief Complaint:  Gangrene of Foot    History of Present Illness:  Brandy [name] is a 60 y.o. female who presents for evaluation of gangrene right 3rd toe.  She states the she has had multiple areas of dead tissue after aortic valve replacement and Heparin Induced Thrombocytopenia causing her toes and one of her fingers to turn black.  Her right 3rd toe is painful and is the most affected. She denies any fever, chills, N/V.  She denies any redness to the right foot.    Past Medical History:  Past Medical History:   Diagnosis Date   . History of blood transfusion    . 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 AORTIC VALVE REPLACEMENT     . PROCEDURE N/A 01/27/2016    Procedure: AORTIC VALVE REPLACEMENT; INTRA AORTIC BALLOON INSERTION;  Surgeon: Otho Bellows, MD;  Location: [facility] OR;  Service: Cardiology;  Laterality: N/A;   . TONSILLECTOMY     . TUBAL LIGATION  1980     Current Medications:  Prior to Admission Medications    Medication Dose & Frequency   acetaminophen (TYLENOL) 650 MG CR tablet Take 650 mg by mouth every 8 hours as needed for Pain.   aspirin 81 MG EC tablet Take 1 tablet by mouth daily.   atorvastatin (LIPITOR) 20 MG tablet Take 1 tablet by mouth every night at bedtime.   carvedilol (COREG) 6.25 MG tablet Take 1 tablet by mouth 2 times daily with meals.   furosemide (LASIX) 40 MG tablet Take 1 tablet by mouth daily.   nitroglycerin (NITROSTAT) 2 % ointment Place 0.1 inches onto the skin 3 times daily.   potassium chloride SA (K-DUR,KLOR-CON) 20 MEQ tablet Take 1 tablet by mouth daily.   warfarin (COUMADIN) 3 MG tablet Take 2 tablets by mouth daily. Except one tablet on Wednesday or as directed as SLAC  Patient taking differently: Take 3 mg by mouth daily. Except one tablet on  Wednesday or as directed as SLAC        Allergies:  Heparin and Sulfa (sulfonamide antibiotics)    Family History:  Family History   Problem Relation Age of Onset   . Cancer Mother    . Heart disease Father    . Heart disease Sister    . Cancer Brother    . Cancer Maternal Aunt    . Heart disease Maternal Aunt    . Cancer Maternal Uncle    . Heart disease Maternal Uncle      Social History:  Social History   Substance Use Topics   . Smoking status: Passive Smoke Exposure - Never Smoker   . Smokeless tobacco: Never Used   . Alcohol use No     Immunization:  Immunization History   Administered Date(s) Administered   . Flu Quadrivalent Injectable (PF) 02/25/2016     Review of Systems:  No past foot problems or foot surgery.    Physical Exam:     Vital Signs:  Initial Vitals   BP 03/08/16 1328 128/80   Pulse 03/08/16 1328 97   Resp 03/08/16 1328 16   Temp 03/08/16 1328 37.1 ?C (98.7 ?F)   SpO2 03/08/16 1328 98 %  Exam:  Right 3rd toe has 50% dry gangrene demarcated at about the PIP joint.  It is painful with movement and has mild erythema, mild edema.  There are thin eschars on the tips of right toes 1, 2, 4, 5 and left toes 1, 2, 3, 4, 5.     Vasc: DP, PT-2/4 bilateral  Neuro: Light touch -WNL bilateral feet.    Data:     Labs:  Results for orders placed or performed during the hospital encounter of 03/08/16 (from the past 24 hour(s))   Protime Panel -AM Draw    Collection Time: 03/09/16  6:08 AM   Result Value Ref Range    Protime 22.1 (H) 10.0 - 13.0 Seconds    INR 2.0 (H) 0.9 - 1.2          Imaging for last 24 hours:  X-rays are negative for Osteomyelitis right foot/toes        Assessment:     Diagnoses:  Active Hospital Problems    Diagnosis SNOMED CT(R) Date Noted   . Gangrene of foot (CMS/HCC) GANGRENE OF FOOT 03/08/2016       Plan:   1. I gave her the option to keep the toe dry with Betadine daily or to surgically remove the toe and she wanted to have the toe removed.  I agreed with this and will see if it  can be done tomorrow.  2. NPO per midnight tonight.  3. Consent signed and in chart.  INR is 2.0 which is OK for what I have to do.   4. I will check tomorrow for scheduled time or add on case.  5. I don't see any reason for her to stay after the surgery unless there are other medical issues needing to be addressed.    [name]  03/09/2016  6:44 PM

## 2016-03-09 NOTE — Progress Notes (Signed)
End of Shift Report     Significant Events      Relevant   Assessment Findings Scheduled amputation of the right toe. Wounds painted with iodine and Kalto stat applied.       Patient/Family  Questions/Concerns      DC Barriers Amputation, IV abx.    General Medicine Patients       Is the patient voiding or having normal catheter output? Yes      Is the patient eating/drinking adequately? Yes      Last bowel movement?   03/09/2016      What is the stability of patient?  VSS      How is pain managed and controlled? (last dose) Pt denied pain        Calls made to patient care provider   MD paged with pt questions about plan of care.       New orders related to changes in patient condition?         Miscellaneous information from the shift    Dialysis Patients        When was last dialysis and were there any issues? NA      When is next dialysis planned any special instructions? NA

## 2016-03-09 NOTE — Progress Notes (Signed)
Brandy [name] is a 60 y.o. female patient.    Hospital Day: 2  * No surgery found *  * No surgery found *    Subjective:  Toe pain and foul odor    Objective:  Vitals:    03/09/16 0413   BP: 114/65   Pulse: 78   Resp: 18   Temp: 36.9 ?C (98.5 ?F)   SpO2: 98%     LABS:  Results for orders placed or performed during the hospital encounter of 03/08/16 (from the past 24 hour(s))   Blood Culture -x 2    Collection Time: 03/08/16  2:21 PM   Result Value Ref Range    Blood Culture Result No Growth to Date    Blood Culture -x 2    Collection Time: 03/08/16  2:21 PM   Result Value Ref Range    Blood Culture Result No Growth to Date    MRSA (Methicillin Resistant Staph aureus) by PCR -Next Routine    Collection Time: 03/08/16  2:29 PM   Result Value Ref Range    MRSA DNA Result Not Detected Not Detected       Physical exam:  A&O  Cardiovascular: RRR no murmur  Lung: CTA no wheezes, or ronchi  Abdomen: soft nt/nd/bsx4Q  Wound healing WNL    Assessment:  Active Hospital Problems    Diagnosis SNOMED CT(R) Date Noted   . Gangrene of foot (CMS/HCC) GANGRENE OF FOOT 03/08/2016       Plan:    Patient stable  No growth to date on cultures  Echo unremarkable  Podiatry consult already placed    Brandy [name]  03/09/2016

## 2016-03-09 NOTE — Progress Notes (Signed)
End of Shift Report     Significant Events   Uneventful night.     Relevant   Assessment Findings   Pt slept for most of the night. Foot wound dressing intact. Complains of pain on the said part but didn't need pain medication.    Tele: NSR      Patient/Family  Questions/Concerns   Date and time of possible toe amputation.     DC Barriers    General Medicine Patients       Is the patient voiding or having normal catheter output?   Yes, uses the commode      Is the patient eating/drinking adequately?   Yes      Last bowel movement?     03/07/16      What is the stability of patient?    MEWS and VSS      How is pain managed and controlled? (last dose)   Didn't need pain med for the toe pain       Calls made to patient care provider           New orders related to changes in patient condition?         Miscellaneous information from the shift

## 2016-03-09 NOTE — Progress Notes (Signed)
Nutrition Assessment     Brandy [name] is a 60 y.o. female, DOB Oct 23, 1955    Reason for assessment: admit screen    Subjective:  Non-healing wound noted per admit screen. Recent intake value reflect: 75% bfast this am. Per wt graph, pt has lost 55lb since past 1.5 months (28% wt loss). However after discussion with pt, unclear if this was fluid. Pt not able to recall UBW. Pt reports a good appetite, tolerating current diet. Denies N/V/D/C. Denies difficulty chewing/swallowing.     Anthropometric Measurements:   Ht: 167.6 cm (66)        Admit Wt: 90.6kg  IBW: 59.1kg  %IBW: 153%  BMI: 32.3  Ht/wt status:obese class 1              Nutrition-Focused Physical Findings:  Patient appears well-nourished and well-hydrated  Nutrition risk factors: recent admission with complicated course, infected middle digit right foot  Risk for nutritional deficit:moderate      Food and Nutritional Intake:  Diet Orders:   Procedures   . Diet Regular; No - patient to receive automatic trays     PO Intake is adequate    Allergies:Heparin and Sulfa (sulfonamide antibiotics)     Client History:  Admission Diagnosis: Gangrene of Foot  Gangrene of foot (CMS/HCC)  Gangrene of foot (CMS/HCC)    Diagnosis:     SNOMED CT(R)   1. Gangrene of foot (CMS/HCC)  GANGRENE OF FOOT     Past Medical History:   Diagnosis Date   . History of blood transfusion    . 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 AORTIC VALVE REPLACEMENT     . PROCEDURE N/A 01/27/2016    Procedure: AORTIC VALVE REPLACEMENT; INTRA AORTIC BALLOON INSERTION;  Surgeon: Otho Bellows, MD;  Location: [facility] OR;  Service: Cardiology;  Laterality: N/A;   . TONSILLECTOMY     . TUBAL LIGATION  1980       Usual meds include: lipitor, lasix, coumadin  Current meds include: lipitor, colace, pepcid, zofran    Biochemical Data, Medical Tests and Procedures:  Lab Results   Component Value Date    NA 140 03/06/2016    K 4.00  03/06/2016    CL 104.0 03/06/2016    CO2 22 03/06/2016    GLU 98 03/06/2016    BUN 15 03/06/2016    CREATININES 0.77 03/06/2016    LABCREA 77 01/28/2016    MG 1.8 02/10/2016    PHOS 1.8 (L) 02/17/2016       Nutrition Diagnosis (Problem, Etiology, Signs and Symptoms)  No nutrition diagnosis at this time.    Nutrition Intervention:   -Continue current diet; will follow for PO adequacy     Monitoring and Evaluation:   Monitor intake, labs, changes or improvements in nutritional status per nutrition protocol     Follow Up: 4-8 days      Laqueta Due,  03/09/2016  1:39 PM  [facility]

## 2016-03-10 LAB — TISSUE EXAM

## 2016-03-10 MED ORDER — fentaNYL (PF) (SUBLIMAZE) 50 mcg/mL injection
50 | INTRAMUSCULAR | Status: DC | PRN
Start: 2016-03-10 — End: 2016-03-10
  Administered 2016-03-10 (×4): 50 mcg/mL via INTRAVENOUS

## 2016-03-10 MED ORDER — lidocaine 20 mg/mL (2 %) injection
20 | INTRAMUSCULAR | Status: AC
Start: 2016-03-10 — End: ?

## 2016-03-10 MED ORDER — aspirin EC tablet 81 mg
81 | Freq: Every day | ORAL | Status: DC
Start: 2016-03-10 — End: 2016-03-12
  Administered 2016-03-11 – 2016-03-12 (×3): 81 mg via ORAL

## 2016-03-10 MED ORDER — HYDROmorphone (DILAUDID) syringe 0.4-0.8 mg
1 | INTRAMUSCULAR | Status: DC | PRN
Start: 2016-03-10 — End: 2016-03-10

## 2016-03-10 MED ORDER — levofloxacin (LEVAQUIN) 500 mg in D5W IVPB Premix
500 | INTRAVENOUS | Status: DC
Start: 2016-03-10 — End: 2016-03-10
  Administered 2016-03-10: 22:00:00 500 mg via INTRAVENOUS

## 2016-03-10 MED ORDER — HYDROmorphone (DILAUDID) syringe 0.2-0.4 mg
1 | INTRAMUSCULAR | Status: DC | PRN
Start: 2016-03-10 — End: 2016-03-10

## 2016-03-10 MED ORDER — polyvinyl alcohol (LIQUIFILM TEARS) 1.4 % ophthalmic solution 2 drop
1.4 | OPHTHALMIC | Status: DC | PRN
Start: 2016-03-10 — End: 2016-03-12

## 2016-03-10 MED ORDER — phenylephrine (NEO-SYNEPHRINE) injection
10 | INTRAMUSCULAR | Status: DC | PRN
Start: 2016-03-10 — End: 2016-03-10
  Administered 2016-03-10 (×6): 10 mg/mL via INTRAVENOUS

## 2016-03-10 MED ORDER — diphenhydrAMINE (BENADRYL) injection 25 mg
50 | INTRAMUSCULAR | Status: DC | PRN
Start: 2016-03-10 — End: 2016-03-10

## 2016-03-10 MED ORDER — fentaNYL (PF) (SUBLIMAZE) 50 mcg/mL injection 25-50 mcg
50 | INTRAMUSCULAR | Status: DC | PRN
Start: 2016-03-10 — End: 2016-03-10

## 2016-03-10 MED ORDER — lidocaine 20 mg/mL (2 %) injection
20 | INTRAMUSCULAR | Status: DC | PRN
Start: 2016-03-10 — End: 2016-03-10
  Administered 2016-03-10: 22:00:00 20 mg/mL (2 %) via SUBCUTANEOUS

## 2016-03-10 MED ORDER — fentaNYL (PF) (SUBLIMAZE) 50 mcg/mL injection
50 | INTRAMUSCULAR | Status: AC
Start: 2016-03-10 — End: ?

## 2016-03-10 MED ORDER — ondansetron (ZOFRAN) injection
4 | INTRAMUSCULAR | Status: DC | PRN
Start: 2016-03-10 — End: 2016-03-10
  Administered 2016-03-10: 22:00:00 4 mg/2 mL via INTRAVENOUS

## 2016-03-10 MED ORDER — levoFLOXacin (LEVAQUIN) tablet 750 mg
500 | Freq: Every day | ORAL | Status: DC
Start: 2016-03-10 — End: 2016-03-12
  Administered 2016-03-11 – 2016-03-12 (×2): 500 mg via ORAL

## 2016-03-10 MED ORDER — bupivacaine (MARCAINE) 0.5 % (5 mg/mL) injection
0.5 | INTRAMUSCULAR | Status: DC | PRN
Start: 2016-03-10 — End: 2016-03-10
  Administered 2016-03-10: 22:00:00 0.5 % (5 mg/mL) via SUBCUTANEOUS

## 2016-03-10 MED ORDER — metoclopramide HCl (REGLAN) tablet 10 mg
10 | ORAL | Status: DC | PRN
Start: 2016-03-10 — End: 2016-03-12

## 2016-03-10 MED ORDER — metoclopramide HCl (REGLAN) injection 10 mg
5 | Freq: Once | INTRAMUSCULAR | Status: DC | PRN
Start: 2016-03-10 — End: 2016-03-10

## 2016-03-10 MED ORDER — sodium chloride (OCEAN) 0.65 % nasal spray 2 spray
0.65 | NASAL | Status: DC | PRN
Start: 2016-03-10 — End: 2016-03-12

## 2016-03-10 MED ORDER — bupivacaine (MARCAINE) 0.5 % (5 mg/mL) injection
0.5 | INTRAMUSCULAR | Status: AC
Start: 2016-03-10 — End: ?

## 2016-03-10 MED ORDER — naloxone (NARCAN) injection 0.08 mg
0.4 | INTRAMUSCULAR | Status: DC | PRN
Start: 2016-03-10 — End: 2016-03-12

## 2016-03-10 MED ORDER — ipratropium-albuterol (DUO-NEB) 0.5 mg-3 mg(2.5 mg base)/3 mL nebulizer solution 3 mL
0.5 | RESPIRATORY_TRACT | Status: DC | PRN
Start: 2016-03-10 — End: 2016-03-10

## 2016-03-10 MED ORDER — propofol (DIPRIVAN) injection
10 | INTRAVENOUS | Status: DC | PRN
Start: 2016-03-10 — End: 2016-03-10
  Administered 2016-03-10: 21:00:00 10 mg/mL via INTRAVENOUS

## 2016-03-10 MED ORDER — meperidine (PF) (DEMEROL) syringe 12.5 mg
50 | INTRAMUSCULAR | Status: DC | PRN
Start: 2016-03-10 — End: 2016-03-10

## 2016-03-10 MED ORDER — fentaNYL (PF) (SUBLIMAZE) 50 mcg/mL injection 50-100 mcg
50 | INTRAMUSCULAR | Status: DC | PRN
Start: 2016-03-10 — End: 2016-03-10

## 2016-03-10 MED ORDER — benzocaine-menthol (CEPACOL) lozenge 1 lozenge
15-2.6 | Status: DC | PRN
Start: 2016-03-10 — End: 2016-03-12

## 2016-03-10 MED ORDER — prochlorperazine (COMPAZINE) injection 10 mg
10 | Freq: Four times a day (QID) | INTRAMUSCULAR | Status: DC | PRN
Start: 2016-03-10 — End: 2016-03-10

## 2016-03-10 MED ORDER — dexamethasone sodium phos (PF) (DECADRON) 10 mg/mL injection
10 | INTRAMUSCULAR | Status: DC | PRN
Start: 2016-03-10 — End: 2016-03-10
  Administered 2016-03-10: 22:00:00 10 mg/mL via INTRAVENOUS

## 2016-03-10 MED ORDER — lidocaine 20 mg/mL (2 %) injection
20 | INTRAMUSCULAR | Status: DC | PRN
Start: 2016-03-10 — End: 2016-03-10
  Administered 2016-03-10: 21:00:00 20 mg/mL (2 %) via INTRAVENOUS

## 2016-03-10 MED ORDER — haloperidol lactate (HALDOL) injection 0.5-2 mg
5 | Freq: Once | INTRAMUSCULAR | Status: DC | PRN
Start: 2016-03-10 — End: 2016-03-10

## 2016-03-10 MED ORDER — omeprazole (PriLOSEC) capsule 20 mg
20 | Freq: Every day | ORAL | Status: DC
Start: 2016-03-10 — End: 2016-03-12
  Administered 2016-03-11 – 2016-03-12 (×3): 20 mg via ORAL

## 2016-03-10 MED ORDER — ondansetron (ZOFRAN) injection 4 mg
4 | Freq: Four times a day (QID) | INTRAMUSCULAR | Status: DC | PRN
Start: 2016-03-10 — End: 2016-03-10

## 2016-03-10 MED FILL — SENSORCAINE 0.5 % (5 MG/ML) INJECTION SOLUTION: 0.5 % (5 mg/mL) | INTRAMUSCULAR | Qty: 1

## 2016-03-10 MED FILL — FENTANYL (PF) 50 MCG/ML INJECTION SOLUTION: 50 ug/mL | INTRAMUSCULAR | Qty: 2

## 2016-03-10 MED FILL — FAMOTIDINE (PF) IN 0.9% NACL 20 MG/50 ML IVPB PREMIX: 20 mg/50 mL | INTRAVENOUS | Qty: 50

## 2016-03-10 MED FILL — ATORVASTATIN 20 MG TABLET: 20 mg | ORAL | Qty: 1

## 2016-03-10 MED FILL — LEVOFLOXACIN IN D5W 500 MG/100 ML IVPB PREMIX: 500 mg/dL | INTRAVENOUS | Qty: 100

## 2016-03-10 MED FILL — LIDOCAINE HCL 20 MG/ML (2 %) INJECTION SOLUTION: 20 mg/mL (2 %) | INTRAMUSCULAR | Qty: 50

## 2016-03-10 NOTE — Anesthesia Pre-Procedure Evaluation (Signed)
Anesthesia Evaluation     Patient summary reviewed and Nursing notes reviewed    Airway   Dental      Pulmonary    Cardiovascular   (+) valvular problems/murmurs (s/p AVR),     ROS comment: Echo 9/27  CONCLUSIONS  1.  Sinus rhythm.  2.  Normotension.  3.  Moderate concentric left ventricular hypertrophy (LVH).  Normal left   ventricular (LV) size.  Below normal function.  Ejection fraction (EF) 55-60%.  4.  Normal right ventricular chamber size and function.  No evidence of   pulmonary arterial hypertension but the inferior vena cava (IVC) appears   enlarged, suggesting a central venous pressure (CVP) of 10 mmHg.  5.  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  It was not well enough displaced to   rule out endocarditis.  Consider transesophageal echocardiogram (TEE) if   clinically indicated.  No significant aortic valve regurgitation.  Forward flow   velocity is appropriate for a 23 mm bioprosthetic valve.  6.  No significant pericardial or pleural effusion.  7.  Compared to prior echo from February 02, 2016, left ventricular (LV) function   has improved. Right ventricular (RV) chamber size and function nearly   normalized.    Neuro/Psych      GI/Hepatic/Renal      Endo/Other     Musculoskeletal                    Anesthesia Plan    ASA 2     general   (This is a 60 year old female who is s/p AVR in 01/2016--her post op course was complicated by difficulty weaning from CPB necessitating IABP. 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 Her ARF has resolved and she is no longer on dialysis. She has not had any chest pain. Labs reviewed.  Plan: LMA)  intravenous induction   Anesthetic Plan, Alternatives, Risks and Questions discussed with patient.    Pre-Anesthesia Evaluation Completed at:  03/10/2016 2:18 PM

## 2016-03-10 NOTE — Plan of Care (Signed)
End of Shift Report     Significant Events Toe amputation on right foot.     Relevant   Assessment Findings Eschar of right index finger, right forearm, toes on both feet.      Patient/Family  Questions/Concerns None voiced     DC Barriers unknown   General Medicine Patients       Is the patient voiding or having normal catheter output? voiding      Is the patient eating/drinking adequately? NPO for most of shift, eating dinner at end of shift.      Last bowel movement?   03/09/16      What is the stability of patient?        How is pain managed and controlled? (last dose) Denies pain       Calls made to patient care provider   no      New orders related to changes in patient condition?         Miscellaneous information from the shift

## 2016-03-10 NOTE — Progress Notes (Signed)
Brandy [name] is a 60 y.o. female patient.    Hospital Day: 3  * No surgery found *  * No surgery found *    03/10/16:  Patient comfortably resting in bed with husband in room. Feeling quite well and optimistic despite problems with toe. Hopeful for surgery today to amputate. No other complaints at this time.    Subjective:  Toe pain and foul odor    Objective:  Vitals:    03/10/16 0503   BP: 142/81   Pulse: 93   Resp: 18   Temp: 36.7 ?C (98.1 ?F)   SpO2: 96%     LABS:  No results found for this visit on 03/08/16 (from the past 24 hour(s)).    Physical exam:  A&O  Cardiovascular: RRR no murmur  Lung: CTA no wheezes, or ronchi  Abdomen: soft nt/nd/bsx4Q  Wound healing WNL    Assessment:  Active Hospital Problems    Diagnosis SNOMED CT(R) Date Noted   . Gangrene of foot (CMS/HCC) GANGRENE OF FOOT 03/08/2016       Plan:    Patient stable  No growth to date on cultures  Echo unremarkable  Podiatry consult yesterday, working to get patient to OR today for amputation.    Rogelio Waynick  03/10/2016

## 2016-03-10 NOTE — Op Note (Signed)
DATE OF PROCEDURE:  03/10/2016    PREOPERATIVE DIAGNOSIS:  Dry gangrene, right 3rd digit.    POSTOPERATIVE DIAGNOSIS:  Dry gangrene, right 3rd digit.    PROCEDURE:  Amputation, right 3rd digit.    ANESTHESIOLOGIST:  Hubbard Hartshorn, MD    ANESTHESIA:  General with right 3rd digital block.    ESTIMATED BLOOD LOSS:  None.    DESCRIPTION OF PROCEDURE:  The patient was brought to the OR and placed on the   OR table in the supine position.  General anesthesia was administered per Dr.   Lonna Cobb.  The right foot was sterilely prepped and draped in the usual sterile   manner.  A right sterile ankle tourniquet was applied, the foot elevated above   heart level, an Esmarch bandage wrapped from toes to the ankle, and tourniquet   inflated to 250 mmHg.  The right 3rd digit was then anesthetized with a 1:1   mixture of 2% lidocaine plain and 0.5% Marcaine plain.    Attention was then directed to the right 3rd digit where a circumferential   incision was made around the base of the toe proximally at the level of the mid   proximal phalanx.  Incision was made at the demarcation of the gangrenous tissue  and a cushion of viable tissue.  Incision was deepened down to bone.  The toe   was disarticulated at the proximal interphalangeal joint and removed in toto,   sent to pathology for examination.  The head of the proximal phalanx was removed  with a bone cutter.  There were no signs of any infection, no purulence, and no   other necrotic tissue in the wound.  The wound was flushed with sterile saline   solution.  The skin edges were reapproximated and closed with 4-0 nylon.  The   tourniquet was released.  Good blood flow was noted to all remaining digits of   the right foot.  The wound was then dressed with Xeroform gauze, 4 x 4's,   Kerlix, Coban.    The patient tolerated the procedure and anesthesia well and left the OR in   apparent satisfactory condition with vital signs stable and vascular status   intact to all remaining  digits and the 3rd digital stump.  The patient was   discharged back to her room recovered.    Daryel November, DPM    ECM/lw  D:  03/10/2016  3:03 P   T:  03/11/2016  10:18 A   TID:  [id number]  RECEIPT:    [id number]

## 2016-03-10 NOTE — Progress Notes (Addendum)
End of Shift Report     Significant Events Pt slept well last night.     Relevant   Assessment Findings For amputation of the right toe today. 1 out of 2 CHG bath done with full bed change.     Eschar - right finger and right toes.      Patient/Family  Questions/Concerns Surgery explained by MD last night to pt and family     DC Barriers Surgical procedure   General Medicine Patients       Is the patient voiding or having normal catheter output? Yes. Ambulating to bathroom with 1 assist      Is the patient eating/drinking adequately? NPO since midnight      Last bowel movement?   03/09/2016      What is the stability of patient?  VSS      How is pain managed and controlled? (last dose) No c/o of pain during shift       Calls made to patient care provider   none      New orders related to changes in patient condition?  none       Miscellaneous information from the shift none   Dialysis Patients        When was last dialysis and were there any issues? n/a      When is next dialysis planned any special instructions? n/a     Hourly Rounding:  ??  The health care team, including the RN and CNA, performed nightly hourly rounding every 1-2 hours in order to ensure a high level of quality care, safety checks, and patient satisfaction.   ?  Patient's expectations and goals were addressed and patient's needs were attended throughout the night.

## 2016-03-10 NOTE — Brief Op Note (Signed)
Brief Operative Note    Procedure(s) (LRB):  AMPUTATION TOE (Right)     Preoperative Diagnoses:   gangrenous toe    Postoperative Diagnoses:  * No Diagnosis Codes entered *     Surgeons: Surgeon(s) and Role:     * Chrystie Nose, DPM - Primary    Assistant(s): * No surgical staff found *     Anesthesia Provider: Anesthesiologist: Hubbard Hartshorn, MD    Anesthesia Type: General    Height & Weight:167.6 cm (66) & 90.6 kg (199 lb 12.8 oz)     Specimens:   ID Type Source Tests Collected by Time Destination   A : right 3rd toe Tissue Toe TISSUE EXAM Chrystie Nose, DPM 03/10/2016 1416               Implants:none    Antibiotics (admin):   Recent Abx Admin                   levofloxacin (LEVAQUIN) 500 mg in D5W IVPB Premix (mg) 500 mg Given 03/10/16 1434    levofloxacin (LEVAQUIN) 500 mg in D5W IVPB Premix (mg) 500 mg New Bag 03/09/16 1556     500 mg New Bag 03/08/16 1610                Incision Time:   Anes Incision Time     Date Time Event    03/10/2016 1440 Incision / Procedure Start           Timeout Completed:  Timeouts     Burlene Arnt, RN at [facility] Mar 10, 2016 1440     Timeout Details     Timeout type Pre-incision                Burlene Arnt, RN at [facility] Mar 10, 2016 1451     Timeout Details     Timeout type Sign-out                       Estimated Blood Loss: * none - 03/10/2016  2:40 PM to 03/10/2016  2:59 PM *    Urethral Catheter:           Findings: none    Complications: None     Disposition: PACU - hemodynamically stable.    [name]

## 2016-03-10 NOTE — Progress Notes (Signed)
RESPIRATORY ASSESSMENT    SUBJECTIVE:   Pt reports  No sob  No wob  No home meds  No home o2  No home cpap   Non smoker     OBJECTIVE:   Current Vital Signs:    Pre and post C/S: Clear   HR:  81   RR:  20              Cough: None   SpO2:   94 RA     ASSESSMENT:    No respiratory distress at this time     PLAN:   Rt to reevaluate if needed.     GOAL(S):  Supplemental Oxygen:   Maintain SpO2 > 90  %  Normalize and maintain an adequate SpO2/PaO2 appropriate for patient clinical situation    Per PDP # 102 and MD orders    E.S.  Sheran Fava, RRT

## 2016-03-11 MED ORDER — HYDROcodone-acetaminophen (NORCO) 5-325 mg per tablet 1 tablet
5-325 | ORAL | Status: DC | PRN
Start: 2016-03-11 — End: 2016-03-12

## 2016-03-11 MED ORDER — warfarin (COUMADIN) tablet 6 mg
3 | Freq: Every day | ORAL | Status: DC
Start: 2016-03-11 — End: 2016-03-12
  Administered 2016-03-12: 01:00:00 3 mg via ORAL

## 2016-03-11 MED ORDER — acetaminophen (TYLENOL) tablet 650 mg
325 | ORAL | Status: DC | PRN
Start: 2016-03-11 — End: 2016-03-12
  Administered 2016-03-12: 23:00:00 325 mg via ORAL

## 2016-03-11 MED FILL — ATORVASTATIN 20 MG TABLET: 20 mg | ORAL | Qty: 1

## 2016-03-11 MED FILL — STOOL SOFTENER 250 MG CAPSULE: 250 mg | ORAL | Qty: 1

## 2016-03-11 MED FILL — ASPIRIN 81 MG TABLET,ENTERIC COATED: 81 mg | ORAL | Qty: 1

## 2016-03-11 MED FILL — LEVOFLOXACIN 500 MG TABLET: 500 mg | ORAL | Qty: 2

## 2016-03-11 MED FILL — ACETAMINOPHEN 325 MG TABLET: 325 mg | ORAL | Qty: 1

## 2016-03-11 MED FILL — OMEPRAZOLE 20 MG CAPSULE,DELAYED RELEASE: 20 mg | ORAL | Qty: 1

## 2016-03-11 NOTE — Other (Signed)
IP PT TREATMENT NOTE (NOT SEEN)    [name] DOB:  1955-10-21 / 60 y.o. Date:  03/11/2016     ASSESSMENT     No treatment provided.  Brandy [name] was not seen for the following reason:    Requested nursing to order post op shoe.  Will see Pt once shoe available. Will evaluate Pt tomorrow morning.      PLAN     Brandy [name] will remain on the Physical Therapy schedule until goals are met, or there has been a change in the Plan of Care.          Treatment Diagnosis Surgery / # Days Post-op (if applicable)       Procedure(s) (LRB):  AMPUTATION TOE (Right) / 1 Day Post-Op               OBJECTIVE   NA          Session times   Start/Stop Times:   -     Total Time:   minutes      This note to serve as a Discharge Summary if Brandy [name] is discharged from the hospital or from therapy services.     Therapist:  Thomes Dinning, PT

## 2016-03-11 NOTE — Progress Notes (Signed)
PHARMACIST PROGRESS NOTE     The following pharmacy services were provided: anti-infective interventions and warfarin dosing    SUBJECTIVE / OBJECTIVE     Brandy [name] is [name] admitted on 03/08/2016.    No interview was performed today.    I have reviewed available relevant labs, vitals and flowsheets today.  The following are especially relevant:  Last INR on 9/28 = 2    ASSESSMENT       Warfarin Dosing  Warfarin indication:  Valve replacement  Goal INR:  2 - 3    Items that may affect INR:  Drug-drug interaction(s)  The following significant drug interaction(s) were identified that may affect warfarin sensitivity:  On levofloxacin which can have significant interaction with warfarin, often delayed a few days    Home warfarin regimen:  6 mg daily except 3 mg on Weds  Warfarin held for toe amputation on 9/29    Anti-infective Interventions  Indication(s):  Right middle toe infection pending podiatrist evaluation and amputation 9/28. In setting of recent AVR.   Current anti-infective(s):  Levofloxacin 500mg  IV daily 9/27 - 9/29. Changed to 750mg  PO daily on 9/30.   Relevant previous anti-infective(s):  Cipro as an outpatient.  Days of therapy appropriate for indication:  Day #4; day #1 post-op  Duration per literature:  Amputation:  Pathogen specific therapy for 24-48 hours post amputation.  If sepsis syndrome present or bacteremia defined, treat longer and follow recommendations in that pathogen's module.  If not all infected bone removed, then treat 4-6 weeks of pathogen directed therapy  Reference(s):  [facility]       Warfarin Dosing  Continue current warfarin dosing.    INR daily  Resume home warfarin dosage. Note on levofloxacin s/p toe amputation for gangrene on 9/29. May consider dosage reduction pending INR which should be followed closely.  Note: Embolization of cholesterol crystals (cholesterol microembolism) is a rare complication of anticoagulation with warfarin. Typically,  blue toe syndrome occurs after several weeks of therapy and may present as a dark, purplish, mottled discoloration of the plantar and lateral surfaces of the lower extremities.     Anti-infective Interventions  Monitor cultures; now post-op amputation day #1  Continue current dosage of levofloxacin.           Signed by: Drucie Ip, RPh

## 2016-03-11 NOTE — Progress Notes (Signed)
End of Shift Report     Significant Events none     Relevant   Assessment Findings Right foot dressing clean, dry, intact. Pt complained of pain x1 that was controlled using 325mg  tylenol. Open eschar sites painted with betadine.       Patient/Family  Questions/Concerns none     DC Barriers mobility

## 2016-03-11 NOTE — Plan of Care (Signed)
End of Shift Report     Significant Events None     Relevant   Assessment Findings Bandage on right foot still CDI. Other wounds painted with betadine and are open to air.      Patient/Family  Questions/Concerns Pt has been given pen and paper to write questions and concerns down on. RN tried to address some of the concerns.     DC Barriers Possible DC tomorrow.   General Medicine Patients       Is the patient voiding or having normal catheter output? voiding      Is the patient eating/drinking adequately?       Last bowel movement?   03/09/16. Will pass on to [facility] RN as it is 1839 right now.      What is the stability of patient?        How is pain managed and controlled? (last dose) Denied pain for most of shift, declined pain medication when stated mild pain.       Calls made to patient care provider   No      New orders related to changes in patient condition?  See MAR       Miscellaneous information from the shift

## 2016-03-11 NOTE — Progress Notes (Addendum)
CARDIOTHORACIC SURGERY CRITICAL CARE NOTE     Name: Brandy [name]  DOB: [birth year] 60 y.o.  MRN: [id number]  CSN: [id number]    Brandy [name] is a 60 y.o. female patient. Patient was admitted with Gangrene of foot (CMS/HCC)  Who had Procedure(s):  AMPUTATION TOE.  1 Day Post-Op    HPI: The patient is a 60 y.o. female with the below medical co-morbid conditions.    ?  History of present illness:  Patient is well known to me she underwent AVR with 23mm tissue valve.  She had a very long post op course.  She required intra-aortic balloon pump to disconnect from cardiopulmonary bypass.  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  She had necrosis of her digits.  She was discharged after many weeks to rehab and did well.  She has had autonecrosis of the finger but her middle right toe appears to be progressing to wet gangrene.  ?  Patient complains of more pain. 5/10 severity with walking better with rest.  There is now a foul odor which is worse in past few days.  She denies fever or chills.  Given new valve the risk is high.  She is admitted for workup and removal of the toe is likely needed.    Subjective:feels good    Assessment & Plan:     Active Hospital Problems    Diagnosis SNOMED CT(R) Date Noted   . Gangrene of foot (CMS/HCC) GANGRENE OF FOOT 03/08/2016       Scheduled Medications:   . aspirin  81 mg Oral Daily   . atorvastatin  20 mg Oral Nightly   . docusate sodium  250 mg Oral Daily   . levoFLOXacin  750 mg Oral Daily   . omeprazole  20 mg Oral Daily     Infusions:    Recent Labs:  Lab Results   Component Value Date    NA 140 03/06/2016    K 4.00 03/06/2016    CL 104.0 03/06/2016    CO2 22 03/06/2016    GLU 98 03/06/2016    BUN 15 03/06/2016    CREATININES 0.77 03/06/2016    CALCIUM 11.1 (H) 03/06/2016    AST 25 02/17/2016    ALT 20 02/17/2016    ALKPHOS 88 02/17/2016    BILITOT 0.90 01/21/2016    ALBUMIN 2.3 (L) 02/17/2016    ALBUMIN 2.3 (L) 02/17/2016    GLOB 2.9 02/17/2016    AGRATIO 0.8 (L) 02/17/2016    ANIONGAP 18.0 03/06/2016    LABGLOM >60.0 03/06/2016    WHITEBLOODCE 6.7 03/06/2016    WHITEBLOODCE 6.7 03/06/2016    LABPLAT 305 03/06/2016    HGB 12.1 03/06/2016    PROTIME 22.1 (H) 03/09/2016    INR 2.0 (H) 03/09/2016    INR 1.9 (External Abn) 03/06/2016    APTT 38 (H) 02/17/2016          Vital Signs:      ? Cardiac output      ? Cardiac Index    Temp: 36.4 ?C (97.6 ?F) BP: 128/80 Pulse: 91 Resp: 18 SpO2: 96 % on   None (Room air)   Intake/Output:         Exam:  Right foot bandaged  No murmur  Breathing fine    PLAN:  Neurologic:   No issues  Cardiac: Pre-op EF: 55%   New tissue valve      Respiratory:    Smoker non   Pulmonary  toilet        Gastrointestinal:    Genitourinary: creat: 0.77    Infectious Disease:   Last day of levaquin for foot      Hematology: HGB 12.5   PLTS 305  Endocrine: Glucose management   Electrolytes: replacing Mg/K/Ca  Prophylaxis: no heparin or lovenox secondary to HITT    TED and ICD  PT for boot for ambulation   Home tomorrow     Otho Bellows

## 2016-03-12 LAB — PROTIME-INR
INR: 1.2 (ref 0.9–1.2)
INR: 1.2 (ref 0.9–1.2)
Protime: 12.9 Seconds (ref 10.0–13.0)
Protime: 13.3 Seconds — ABNORMAL HIGH (ref 10.0–13.0)

## 2016-03-12 MED ORDER — acetaminophen (TYLENOL) 650 MG CR tablet
650 | ORAL_TABLET | Freq: Three times a day (TID) | ORAL | 0 refills | 5.00000 days | Status: DC | PRN
Start: 2016-03-12 — End: 2019-09-03

## 2016-03-12 MED ORDER — levoFLOXacin (LEVAQUIN) 750 MG tablet
750 | ORAL_TABLET | Freq: Every day | ORAL | 0 refills | Status: AC
Start: 2016-03-12 — End: 2016-03-21

## 2016-03-12 MED ORDER — HYDROcodone-acetaminophen (NORCO) 5-325 mg per tablet
5-325 | ORAL_TABLET | Freq: Four times a day (QID) | ORAL | 0 refills | 30.00000 days | Status: DC | PRN
Start: 2016-03-12 — End: 2016-05-31

## 2016-03-12 MED ORDER — warfarin (COUMADIN) 6 MG tablet
6 | ORAL_TABLET | Freq: Every day | ORAL | 2 refills | 62.00000 days | Status: DC
Start: 2016-03-12 — End: 2016-03-13

## 2016-03-12 MED FILL — OMEPRAZOLE 20 MG CAPSULE,DELAYED RELEASE: 20 mg | ORAL | Qty: 1

## 2016-03-12 MED FILL — ASPIRIN 81 MG TABLET,ENTERIC COATED: 81 mg | ORAL | Qty: 1

## 2016-03-12 MED FILL — STOOL SOFTENER 250 MG CAPSULE: 250 mg | ORAL | Qty: 1

## 2016-03-12 MED FILL — ATORVASTATIN 20 MG TABLET: 20 mg | ORAL | Qty: 1

## 2016-03-12 MED FILL — COUMADIN 3 MG TABLET: 3 mg | ORAL | Qty: 2

## 2016-03-12 MED FILL — LEVOFLOXACIN 500 MG TABLET: 500 mg | ORAL | Qty: 2

## 2016-03-12 MED FILL — ACETAMINOPHEN 325 MG TABLET: 325 mg | ORAL | Qty: 2

## 2016-03-12 NOTE — Other (Signed)
IP PT TREATMENT NOTE (NOT SEEN)    [name] DOB:  [birth year] / 60 y.o. Date:  03/12/2016     ASSESSMENT     No treatment provided.  Brandy [name] was not seen for the following reason:    Pt still did not have her post op shoe.  I placed a stat order for the shoe.  Waiting for Pt to receive to do transfer training with the spouse.      PLAN     Brandy [name] will remain on the Physical Therapy schedule until goals are met, or there has been a change in the Plan of Care.          Treatment Diagnosis Surgery / # Days Post-op (if applicable)       Procedure(s) (LRB):  AMPUTATION TOE (Right) / 2 Days Post-Op                Session times   Start/Stop Times:   -     Total Time:   minutes      This note to serve as a Discharge Summary if Brandy [name] is discharged from the hospital or from therapy services.     Therapist:  Thomes Dinning, PT

## 2016-03-12 NOTE — Progress Notes (Signed)
PHARMACIST PROGRESS NOTE     The following pharmacy services were provided: anti-infective interventions and warfarin dosing    SUBJECTIVE / OBJECTIVE     Brandy [name] is [name] admitted on 03/08/2016.    No interview was performed today.    I have reviewed available relevant labs, vitals and flowsheets today.    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 In setting of recent AVR.   Current anti-infective(s):  Levofloxacin 500mg  IV daily 9/27 - 9/29. Changed to 750mg  PO daily on 9/30.   Relevant previous anti-infective(s):  Cipro as an outpatient.  Days of therapy appropriate for indication:  Day #5; day #1 post-op  Duration per literature:  Amputation:  Pathogen specific therapy for 24-48 hours post amputation.  If sepsis syndrome present or bacteremia defined, treat longer and follow recommendations in that pathogen's module.  If not all infected bone removed, then treat 4-6 weeks of pathogen directed therapy  Reference(s):  [facility]       Warfarin Dosing  Continue current warfarin dosing.    INR daily    Anti-infective Interventions  Monitor cultures; now post-op amputation day #1  Continue current dosage of levofloxacin.           Signed by: Drucie Ip, RPh

## 2016-03-12 NOTE — Anesthesia Post-Procedure Evaluation (Signed)
Patient Name: Arbie Reisz  Procedures performed: Procedure(s):  AMPUTATION TOE    Last Vitals:   Vitals:    03/12/16 0735   BP: 150/84   Pulse: 86   Resp: 18   Temp: 36.4 ?C   SpO2: 95%       Planned Anesthesia Type: general  Final Anesthesia Type: general  Patients Current Location: PACU  Level of Consciousness: awake, alert, responds appropriately and oriented  planned opioid use   Post Procedure Pain:adequate analgesia  Airway: Patent  Respiratory Status: nasal cannula  Cardio Status: hemodynamically stable  Hydration: adequately hydrated  PONV prophylaxis Ordered  PONV? NO  no anesthesia complication,       The patient was able to participate in the post op evaluation    Comments:The patient was evaluated in the PACU on 03/10/2016 prior to PACU discharge.      Hubbard Hartshorn  8:33 AM

## 2016-03-12 NOTE — Progress Notes (Signed)
VSS, A&Ox4. Discharge instructions went over with pt and spouse. Verbalized understanding. All questions answered. Pt went home with post op shoe. Transport wheeled pt to exit where spouse to transport pt to home.

## 2016-03-12 NOTE — Other (Signed)
IP PT EVALUATION / INITIAL TREATMENT (BRIEF)    [name] / [id number] DOB:  23-Aug-1955 / 60 y.o. Date:  03/12/2016     Precautions   Specific mobility precautions: Weight Bearing precautions;Sternal precautions  Weight Bearing precautions: Left LE - Partial WB 50%  Sternal precautions: Standard     ASSESSMENT     Summary:  Pt is a 60 year old female admitted with gangrene of foot and previous cardiac surgery.  Pt presently is on sternal precautions and PWB on the right LE with surgical shoe.  Pt was able to go from supine to sit to stand with min to mod assistance adhereing to precautions.  Pt able to pivot transfer with use of FWW for balance and min assistance.  Spouse present during session and had no question regarding how to transfer Pt. Pt ambulation limited to a couple steps/pivot secondary to sternal and PWB precautions.  Pt would benefit from continued skilled PT to progress mobility, strength and endurance and reinforce precautions.      Pt. mobility:  Able to be up with Nursing Team (x 1).     Activity tolerance: Tolerates 30 min activity with multiple rests  Unable to tolerate activity: Excessive general fatigue     Rehab Potential: Good      Precaution Awareness: Fully aware of precaution(s)  Deficit Awareness: Fully aware of deficits  Correction of Errors: Self-corrects with cues  Safety Judgment: Good awareness of safety     Consult recommendations  Pt. would benefit from Discharge Planner consult.   Discharge recommendations  Mobility Equipment needs: None   Primary recommendation : Patient would benefit from Home Health PT.  Justification: Pt below baseline for mobility, strength and endurance.        PLAN     Treatment Plan Frequency    Plan: Ther Ex, Bed mobility training, Transfer training, Muscle Re-Education, Pt/caregiver training, Therapeutic Activity training, ADL training  1x (5 days/week).  Frequency will be increased as pt's condition or discharge plan indicates.          Jasia Hiltunen will remain on the Physical Therapy schedule until goals are met, or there has been a change in the Plan of Care.          Treatment Diagnosis Surgery / # Days Post-op (if applicable)    Primary Dx: Gangrene of foot   Treatment Dx: Altered Mobility   Procedure(s) (LRB):  AMPUTATION TOE (Right) / 2 Days Post-Op     Pertinent Past Medical Hx:   Past Medical History:   Diagnosis Date   . History of blood transfusion    . 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 AORTIC VALVE REPLACEMENT     . PROCEDURE N/A 01/27/2016    Procedure: AORTIC VALVE REPLACEMENT; INTRA AORTIC BALLOON INSERTION;  Surgeon: Otho Bellows, MD;  Location: [facility] OR;  Service: Cardiology;  Laterality: N/A;   . TONSILLECTOMY     . TUBAL LIGATION  1980          SUBJECTIVE   Pt happy to be in chair     Pain Level  Current status: Pain does not limit pt's ability to participate in therapy  Pain at start of session: 0 - No Pain  Pain at end of session: 0 - No Pain   Hx of current problem  Chief complaint: pain with hanging foot down.  General symptoms: Pain;Swelling  Social history  Lives with: Spouse  Support Systems: spouse able to assist  Social Activities: Reading/watching TV/craftwork;Walking/gardening/housework/occasional exercise   Prior LOF  Prior ambulatory status: Walked with single point cane(s);Walked with front wheeled walker (FWW)  Home/Community: Pt. needed assist in home and community   Home environment  Living situation: House  Home style: One level  Number of stairs to enter home: 6  Railings into home: No railing   Home equipment  Mobility Equipment: Front wheeled walker (FWW);Four wheeled walker     OBJECTIVE   Chart review complete .  Nursing cleared Pt for therapy. Pt evaluated for general mobility and safety.      Mental status  General Demeanor: Pleasant;Cooperative  LOC: Alert  Orientation: Oriented x 4  Directions: Follows multi-step  commands  --> Follows multi-step commands: Consistently  Attention span: Appears intact  Memory: Appears intact in social/therapy situations   Medical appliances  Medical Appliances: IV   Strength  Lower extremities: Grossly 3 to 4-/5 bilaterally   ROM  ROM  Lower extremities: WFL bilaterally   Other         TREATMENT  Bed Mobility/Transfers  Bed mobility       Rolling / logroll        Transition to sitting up Supine to Sit: Min Assist;Mod Assist   To resting position      Transition to sitting down Min Assist;Verbal cues     Transition to supine       Scooting     Transfers      Transition to standing From bed: Mod Assist;Verbal cues     Method Stand pivot: Min Assist;Verbal cues (FWW)      Gait/Stairs        Ther. Ex.       Safety/Room Set-up   Call button accessible?: Yes  Phone accessible?: No  Oxygen reconnected?: No, on room air  Patient mobility in room: Able to be up with Nursing Team (x 1).  Position on arrival: In bed  Position on departure (upright): In regular chair (spouse present )     GOALS     Patient/Caregiver goals reviewed and integrated with rehab treatment plan:        Multidisciplinary Problems (Active)        Problem: IP General Goal List - 1    Goal Priority Disciplines Outcome   Bed Mobility     PT    Description:  Pt. to perform bed mobility independently.    Home Exercise Program     PT    Description:  Pt. to perform basic HEP independently.    Caregiver Training     PT    Description:  Patient/caregiver training will be provided, as needed to achieve goals.             Problem: IP General Goal List - 2    Goal Priority Disciplines Outcome   Transfers     PT    Description:  Pt. to perform all functional transfers with min. assist.           Problem: Patient/Family Goal    Goal Priority Disciplines Outcome   Normal Status     PT    Description:  Pt. wants to return to previous level of function.     Home     PT    Description:  Pt. wants to return to previous living situation.    Mobility      PT  Description:  Patient wants to walk again.    Pain     PT    Description:  Patient wants to be in less pain.                 First session Second session (if applicable)    Start/Stop times: 0915 - 0932 Start/Stop times: 1115 - Stop Time: 1207   Total time: 17 minutes  Total time: 52 minutes     This note to serve as a Discharge Summary if Brayla Pat is discharged from the hospital or from therapy services.     Therapist:  Thomes Dinning, PT

## 2016-03-12 NOTE — Progress Notes (Signed)
End of Shift Report     Significant Events Received Pt at 2300. Pt rested through the night.     Relevant   Assessment Findings Pt comfortable, no new findings.      Patient/Family  Questions/Concerns Pt anticipating going home today. No BM since 9/27.      DC Barriers

## 2016-03-12 NOTE — Discharge Instructions (Signed)
Gangrene  Gangrene is a medical condition caused by a lack of blood supply to an area of your body. Oxygen travels through your blood, so less blood means less oxygen. Without oxygen, your body tissue will start to die.   Gangrene can affect many parts of your body. Gangrene is most common on the skin (external gangrene), but it can also affect internal parts of the body (internal gangrene).   Gangrene requires emergency treatment.  CAUSES   Any condition that cuts off blood supply can cause gangrene. Common causes include:  ? Injuries.  ? Blood vessel disease.  ? Diabetes.  ? Infections.  RISK FACTORS  You may be at increased risk for gangrene if you have:  ? A serious injury.  ? Recent surgery.  ? Diabetes.  ? A weak body defense system (immune system).  ? Narrowing of your arteries (arteriosclerosis).  ? An immune system disease that causes your arteries to constrict (Raynaud disease).  ? A history of IV drug use.  SIGNS AND SYMPTOMS  The signs and symptoms depend on what part of your body is affected. If you have external gangrene, you may have:  ? Severe pain followed by loss of feeling.  ? Redness and swelling.  ? Crackling sounds when you press on a swollen area of skin.  ? A wound with bad-smelling drainage.  ? Changes in the color of your skin. It may turn red, blue, black, or white.  ? Fever and chills.  If you have internal gangrene, you may have:  ? Fever and chills.  ? Confusion.  ? Dizziness.  ? Severe pain.  ? Rapid heartbeat and breathing.  ? Loss of appetite.  ? Nausea or vomiting.  DIAGNOSIS   To diagnose gangrene, your health care provider will take your medical history and do a physical exam. Tests may also be done to help in making the diagnosis. These may include:  ? X-rays of your blood vessels after injection of a dye (arteriogram).  ? Blood tests to look for an infection in your blood.  ? Imaging studies such as a CT scan or MRI.  ? Taking a swab from a draining wound to look for bacteria in  the laboratory (culture).  ? Taking a piece of tissue (biopsy) to look for cell death in the laboratory.  ? A surgical procedure to look for gangrene inside your body.  TREATMENT   Treatment for gangrene depends on the cause and the area of your body that is affected. Gangrene is usually treated in the hospital. It is very important to start treatment early before gangrene spreads. Treatment may include the following:  ? Medicine. You may get high doses of antibiotic medicine for gangrene caused by infection. The antibiotics may be given directly into a vein through an IV tube.  ? Surgery. Surgical options may include:    Debridement. This is surgery to remove dead tissue. You may need to have debridement several times.    Bypass or angioplasty. This surgery improves the blood supply to an affected area.    Amputation. Sometimes it is necessary to remove a body part.  ? Oxygen therapy. This involves treatment in a chamber designed to provide high levels of oxygen (hyperbaric oxygen therapy). It can improve the oxygen supply to an affected area.  ? Supportive care. This may include:    IV fluids and nutrients.    Pain medicines.    Blood thinners to prevent blood clots.  HOME CARE   INSTRUCTIONS  ? Take medicines only as directed by your health care provider.  ? If you were prescribed an antibiotic medicine, finish it all even if you start to feel better.  ? Clean any cuts or scratches with an antiseptic.  ? Cover any open wounds.  ? Check wounds for signs of infection. Watch for redness or swelling.  ? Do not use any tobacco products, including cigarettes, chewing tobacco, or electronic cigarettes. If you need help quitting, ask your health care provider.  ? Limit alcohol intake to no more than 1 drink per day for nonpregnant women and 2 drinks per day for men. One drink equals 12 ounces of beer, 5 ounces of wine, or 1? ounces of hard liquor.  ? Eat a healthy diet and maintain a healthy weight.  ? Get some exercise on  most days of the week. Ask your health care provider to suggest activities that are safe for you.  ? If you have diabetes or another blood vessel disease, check your feet often for signs of injury or infection.  ? After any surgery you have, follow your health care provider's instructions carefully.  ? Keep all follow-up visits as directed by your health care provider. This is important.  SEEK MEDICAL CARE IF:  ? You have a wound or sore that is not healing.  ? You have color changes (white, red, blue, or black) in an area of your skin.  ? You have a wound or sore with smelly drainage.  SEEK IMMEDIATE MEDICAL CARE IF:   ? You have rapidly worsening pain at the site of a skin infection or wound.  ? You lose sensation at the site of a skin infection or wound.  ? You have unexplained:  ? Fever.  ? Chills.  ? Confusion.  ? Fainting.  MAKE SURE YOU:   ? Understand these instructions.  ? Will watch your condition.  ? Will get help right away if you are not doing well or get worse.     This information is not intended to replace advice given to you by your health care provider. Make sure you discuss any questions you have with your health care provider.     Document Released: 03/30/2004 Document Revised: 06/19/2014 Document Reviewed: 07/23/2013  Elsevier Interactive Patient Education ?2017 [employer]

## 2016-03-12 NOTE — Discharge Summary (Signed)
Cardiovascular and Thoracic Surgery  [facility]  [address], [address]  [place], [place] [address]  [phone]  Discharge Summary    Patient ID:  Name: Brandy [name]  DOB: August 02, 1955 60 y.o.  MRN: [id number]  CSN: [id number]    Admit date: 03/08/2016    Discharge date: 03/12/2016    Admission Diagnosis: Gangrene of foot (CMS/HCC) [I96]    Discharge Diagnoses:   Active Hospital Problems    Diagnosis SNOMED CT(R) Date Noted   . Gangrene of foot (CMS/HCC) GANGRENE OF FOOT 03/08/2016       Procedures/Treatments: Procedure(s):  AMPUTATION TOE    Please refer to the full dictated operative note by Dr. Margot Ables, [facility] Course:      Patient is well known to our service, she underwent AVR with 23mm tissue valve.  She had a very long post op course.  She required intra-aortic balloon pump to disconnect from cardiopulmonary bypass. 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  She had necrosis of her digits. She was discharged after many weeks to rehab and did well.  She has had autonecrosis of the finger but her middle right toe appears to be progressing to wet gangrene. Patient presented to clinic complains of more pain. 5/10 severity with walking better with rest.  There is now a foul odor which is worse in past few days.  She denies fever or chills.  Given new valve the risk is high.  She is admitted for workup and removal of the toe is likely needed. Amputation of R 3rd digit performed by [name] DPM on 03/10/16 without incident. Patient's pain well controlled. Her breathing is not labored. Dr. Margot Ables initiated the patient on a 10 day course of Levaquin. She has resumed her Coumadin. She needs PT to provide a walking boot to use post amputation at home. Patient instructed to finish her Levaquin prescription for 8 more days. Patient has appointment tomorrow to loop back in with Coumadin clinic at [facility], encouraged to make appointment. No Lovenox as HITT + recently.  Will discharge on 6 mg Coumadin and close follow up at [facility].    Physical Exam:    ? General: Alert and Oriented x 3    ? CV: RRR    ? Lungs: CTA     ? Abdomen: soft non tender     ? Incision: Chest wound: Clean dry and intact     ? Neuro: Cranial nerves II-XII intact    ? Extremities: Edema mild bilateral     ? Distal pulses intact         Labs:  Results for orders placed or performed during the hospital encounter of 03/08/16 (from the past 24 hour(s))   Protime Panel -Now & Q AM    Collection Time: 03/11/16  6:47 PM   Result Value Ref Range    Protime 12.9 10.0 - 13.0 Seconds    INR 1.2 0.9 - 1.2   Protime Panel -Now & Q AM    Collection Time: 03/12/16  5:50 AM   Result Value Ref Range    Protime 13.3 (H) 10.0 - 13.0 Seconds    INR 1.2 0.9 - 1.2         Discharge Medications:     Patient's Discharge Medication List      TAKE these medications       Indications of Use    acetaminophen 650 MG CR tablet   Quantity:  30 tablet   Commonly known as:  TYLENOL   Take 1 tablet by mouth every 8 hours as needed for Pain.        aspirin 81 MG EC tablet   Take 1 tablet by mouth daily.        atorvastatin 20 MG tablet   Quantity:  30 tablet   Commonly known as:  LIPITOR   Take 1 tablet by mouth every night at bedtime.        carvedilol 6.25 MG tablet   Quantity:  60 tablet   Commonly known as:  COREG   Take 1 tablet by mouth 2 times daily with meals.        HYDROcodone-acetaminophen 5-325 mg per tablet   Quantity:  30 tablet   Commonly known as:  NORCO   Take 1 tablet by mouth every 6 hours as needed for Pain.        levoFLOXacin 750 MG tablet   Quantity:  8 tablet   Commonly known as:  LEVAQUIN   Take 1 tablet by mouth daily for 10 days.        nitroglycerin 2 % ointment   Quantity:  10 packet   Commonly known as:  NITROSTAT   Place 0.1 inches onto the skin 3 times daily.        warfarin 6 MG tablet   Quantity:  30 tablet   What changed:    - medication strength  - additional instructions   Commonly known as:  COUMADIN      Take 1 tablet by mouth daily.          STOP taking these medications          furosemide 40 MG tablet   Commonly known as:  LASIX       potassium chloride SA 20 MEQ tablet   Commonly known as:  K-DUR,KLOR-CON             Discharge orders/labs:      Patient Instructions/Follow-up Appointments:      1. Patient is advised to observe sternal precautions and not lift more than 10 pounds for another 6 to 8 weeks.  2. They are advised to avoid driving until they're 1 month out from their surgery.  3.  They may shower when home, but should avoid immersing their surgical wounds under water until 1 month out from the surgery.  4. They're encouraged to undertake some form of reconditioning exercises, mostly in the form of walking and remaining as active as possible on a daily basis.  5.  I would like to see the patient back in my office in 2 to 3 weeks for followup.    I have reviewed all discharge instructions with the patient and answered all of their questions.  They appear to understand and agree to proceed in this manner. If they have any questions or concerns prior to their followup visit they will call our office.    Diet: Regular heart healthy  Discharge Disposition: To home on home health: physical therapy  Follow-up with [facility] Surgery in 2-3 weeks.     LOS: 4 days     Signed:  Maiah Sinning  03/12/2016  3:12 PM

## 2016-03-13 ENCOUNTER — Ambulatory Visit: Admit: 2016-03-13 | Discharge: 2016-03-13

## 2016-03-13 DIAGNOSIS — Z5181 Encounter for therapeutic drug level monitoring: Secondary | ICD-10-CM

## 2016-03-13 LAB — POCT INR
POC INR: 1.3 — ABNORMAL HIGH (ref 0.8–1.2)
POC PT: 15.1 s (ref 9.6–17)

## 2016-03-13 LAB — PROTIME-INR: INR: 1.3 — AB (ref <=1.1)

## 2016-03-13 MED ORDER — warfarin (COUMADIN) 6 MG tablet
6 | ORAL_TABLET | Freq: Every day | ORAL | 1 refills | 62.00000 days | Status: DC
Start: 2016-03-13 — End: 2016-05-31

## 2016-03-13 NOTE — Telephone Encounter (Signed)
-----   Message from Rob Hickman sent at 03/13/2016  8:14 AM PDT -----  Spoke with [facility] for wound care as patient has an address of [place].  They state they only do in-patient care and Daviana would benefit from Home Health due to her address.  [facility] goes to [place] and that would be a great option.  If patient lives local, please let me know and I will change referral to [place].  If not, please see if Dr. Sudie Bailey would be willing to order home health?

## 2016-03-13 NOTE — Telephone Encounter (Addendum)
Result note mistakenly placed in phone note and removed.

## 2016-03-13 NOTE — Telephone Encounter (Signed)
PATIENT INFORMATION   Hospital: [facility]  Disposition: Home or Self Care    Contact made on 03/13/2016 by Analee Montee L. RUPPEL.    PATIENT ASSESSMENT   How is the patient feeling since being discharged from the hospital?: Doing okay.   Were discharge instructions gone over with you prior to discharge?: Yes     Does the patient have the support needed at home?: Yes, patient has support at home.          APP POST VISIT ADDL QUESTIONS 03/01/2016   Has the patient had any change in peripheral edema? Yes   Comment Mild LE edema. Elevating LE prn.    Does the patient weigh themselves daily? Yes, weights daily. No weight gain noted.   Does the patient have any difficulty with breathing and/or chest tightness? No   Comments She is speaking clearly and easily understandable.    Comment Per patient she was instructed to wear LifeVest at all times. She confirmed she is wearing this.    Is the patient on blood thinner?  Yes   Who is managing? Coumadin Clinic.    Comment She has an OV tomorrow w/Coumadin Clinic. Denies s/s of bleeding. She stated she had bruising to [facility] from IV during hospital. Bruising has resolved.    How is the patient's incision healing? Pt stated incision site looks excellent. Denies s/s of infection, n/v or fever. Pt voiced understanding to contiung to monitor for s/s of infection.        MEDICATIONS   Does patient understand what medication they are supposed to be taking?: yes   Has patient filled all medications prescribed at discharge?: No  Comments: Will pick them up today  Is the patient on Coumadin?: Yes  Comments: INR will be drawn today. Managed by [facility]  Any questions about medications and/or side effects?: Patient denies medication questions and/or side effects.       FOLLOW UP   Does patient have follow up appointment with PCP?: No  Comments: She is in need of follow up with Dr. Sudie Bailey Patient is in need of additional follow up. Clinic contact person  notified.         Does patient have follow up with a specialist?: No  Comments: She is in need of follow up with [facility], she states she is going to call to schedule  Does patient need help with tranpsportation to appointments?: Denies transportation needs at this time.     If home care services were ordered at the time of discharge, have they made contact with the patient?: N/A       COMPLETION OF CALL   Are there any further questions or concerns the patient has prior to completing the call?: Denies any questions or concerns at this time.    Copy of note routed to PCP clinical contact person.

## 2016-03-13 NOTE — Telephone Encounter (Signed)
LM for [facility] [phone] to call.  Pt doesn't think they go out to where she lives.  She is 20 miles outside of [place].  When they call back please verify if they service her address.  Her physical address is in pt reg.

## 2016-03-13 NOTE — Progress Notes (Signed)
INR RANGE: (2.0-3.0)    Subtherapeutic POC INR 1.3. Held 9/29 for toe amputation.    Patient is being started on Levaquin today for 10 days. I'm hesitant to increase warfarin too rapidly due to Levaquin.     Give 9 mg today and then increase warfarin 7.7% to 42 mg weekly (6 mg daily).   RTC and check INR in 5 days on 10/6@1 :45.    [facility] Coumadin Flowsheet INR   Latest Ref Rng & Units 0.9 - 1.1   03/13/2016 1.3 (External Abn)   03/12/2016 1.2   03/11/2016 1.2   03/09/2016 2.0 (H)   03/06/2016 1.9 (External Abn)   03/02/2016 1.7 (External Abn)   02/28/2016 1.5 (External Abn)   02/26/2016 2.8 (H)   02/25/2016 2.8 (H)   02/24/2016 2.0 (H)   02/23/2016 1.8 (H)   02/22/2016 1.9 (H)

## 2016-03-13 NOTE — Telephone Encounter (Signed)
[place] verified that pt is to far out.  Pt wants to be referred to [facility] wound care as she comes here for other appt.      Thanks!

## 2016-03-13 NOTE — Telephone Encounter (Signed)
Patient is scheduled for TCM   Future Appointments  Date Time Provider Department Center   03/16/2016 1:20 PM [name], DO AP[facility]M1 APP Clinics   03/17/2016 1:45 PM [facility] ANTICOAG/MTM ROOM 2 [facility]   03/29/2016 10:30 AM Brayton Caves, PA AP CVTS APP Clinics   05/24/2016 2:00 PM [name], DO [facility] APP Clinics

## 2016-03-13 NOTE — Telephone Encounter (Signed)
Patient notified of results.    ----- Message from [name], DO sent at 03/13/2016  3:21 PM PDT -----  Results blood culture no growth .Please notify patient.

## 2016-03-14 NOTE — Telephone Encounter (Signed)
Changed order to [facility].

## 2016-03-16 ENCOUNTER — Encounter: Admit: 2016-03-16 | Discharge: 2016-03-16 | Payer: MEDICAID | Attending: DO

## 2016-03-16 DIAGNOSIS — L97529 Non-pressure chronic ulcer of other part of left foot with unspecified severity: Secondary | ICD-10-CM

## 2016-03-16 LAB — POCT INR: INR: 1.9 — AB (ref <=1.1)

## 2016-03-16 NOTE — Progress Notes (Signed)
Fleet Contras in Referral's has this patient's referral to the Wound Clinic at [facility] in process.

## 2016-03-16 NOTE — Progress Notes (Signed)
Subjective   Chief Complaint:  Chief Complaint   Patient presents with   . Transition Care Management (Tcm)     gangrene of middle right toe, amputation of 3rd right digit, levaquin         History of Present Illness:  Hospital Follow-Up: Brandy [name] is a 60 y.o. female who is here today after being discharged from [facility]. She was discharged 8 days  ago. The main problem requiring admission was amputation third toe right foot secondary to autonecrosis with history heparin-induced thrombocytopenia.The Discharge summary and/or Transition of Care Management document was reviewed. Medication reconciliation has been performed as indicated below. The day 2 post Discharge Transitional Care Management encounter was reviewed.     Post discharge course:  60 year old female recently discharged from hospital following an amputation third toe right foot.  Developed  autonecrosis of toes following a bout of heparin-induced thrombocytopenia. Patient was originally admitted for aortic valve replacement and placed on heparin. She developed acute renal failure with HIT and gangrenous changes of her toes and feet. Third toe right foot developed into wet gangrene and was recently amputated. Since discharge patient has done well. She rarely uses Norco for pain. She is following weight lifting precautions (no more than 10 pounds) following the thoracotomy and aortic valve replacement surgery. She is compliant with Lipitor 20 mg daily, Coreg 6.25 mg twice daily, Lasix 40 mg daily and is currently taking Levaquin 750 mg daily. Also taking  Coumadin 6 mg daily. Today's POC INR is 1.9.  She does have outpatient follow-up for wound care and cardiology follow-up.     Objective   Vitals:    03/16/16 1257   BP: 120/82   Pulse: 103   Temp: 36.5 ?C (97.7 ?F)   SpO2: 96%     Body mass index is 31.8 kg/(m^2).  History   Smoking Status   . Passive Smoke Exposure - Never Smoker   Smokeless Tobacco   . Never Used       Current Outpatient  Prescriptions:   .  acetaminophen (TYLENOL) 650 MG CR tablet, Take 1 tablet by mouth every 8 hours as needed for Pain., Disp: 30 tablet, Rfl: 0  .  aspirin 81 MG EC tablet, Take 1 tablet by mouth daily., Disp: , Rfl:   .  atorvastatin (LIPITOR) 20 MG tablet, Take 1 tablet by mouth every night at bedtime., Disp: 30 tablet, Rfl: 1  .  carvedilol (COREG) 6.25 MG tablet, Take 1 tablet by mouth 2 times daily with meals., Disp: 60 tablet, Rfl: 1  .  HYDROcodone-acetaminophen (NORCO) 5-325 mg per tablet, Take 1 tablet by mouth every 6 hours as needed for Pain., Disp: 30 tablet, Rfl: 0  .  levoFLOXacin (LEVAQUIN) 750 MG tablet, Take 1 tablet by mouth daily for 10 days., Disp: 8 tablet, Rfl: 0  .  warfarin (COUMADIN) 6 MG tablet, Take 1 tablet by mouth daily. Or as directed by [employer], Disp: 90 tablet, Rfl: 1  Review of Systems   Constitutional: Negative for chills and fever.   HENT: Negative for congestion.    Eyes: Negative for blurred vision.   Respiratory: Negative for shortness of breath.    Cardiovascular: Negative for chest pain, palpitations, orthopnea, claudication and leg swelling.   Gastrointestinal: Negative for abdominal pain.   Genitourinary: Negative for dysuria.   Musculoskeletal: Negative for back pain.   Neurological: Negative for focal weakness and headaches.   Psychiatric/Behavioral: Negative for depression.     Physical Exam  Constitutional: She is oriented to person, place, and time and well-developed, well-nourished, and in no distress.   HENT:   Head: Normocephalic.   Right Ear: External ear normal.   Left Ear: External ear normal.   Nose: Nose normal.   Mouth/Throat: Oropharynx is clear and moist.   Eyes: Conjunctivae and EOM are normal. Pupils are equal, round, and reactive to light. Right eye exhibits no discharge. Left eye exhibits no discharge.   Neck: Normal range of motion. No thyromegaly present.   Cardiovascular: Normal rate, regular rhythm, normal heart sounds and intact distal pulses.    No  murmur heard.  Necrotic fingertips right hand  Status post third digit amputation right foot  Necrotic toe  tips right foot   Pulmonary/Chest: No respiratory distress. She has no wheezes. She has no rales. She exhibits no tenderness.   Abdominal: Soft. Bowel sounds are normal. She exhibits no mass. There is no tenderness. There is no rebound.   Musculoskeletal: Normal range of motion. She exhibits no edema.   Lymphadenopathy:     She has no cervical adenopathy.   Neurological: She is alert and oriented to person, place, and time. No cranial nerve deficit. Gait normal.   Skin: Skin is warm and dry.   Psychiatric: Affect normal.          Assessment   1. 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 Ischemia of digits of hand  Keep wound care follow-up  Finish Levaquin 750 mg daily  3. S/P AVR (aortic valve replacement)  Doing well, keep cardiology follow-up later this month.  4. NSTEMI (non-ST elevated myocardial infarction) (CMS/HCC)  Continue beta blocker and aspirin and statin  5. 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 Acute renal failure with tubular necrosis (CMS/HCC)  Resolved  7. Passive smoke exposure  8. BMI 31.0-31.9,adult  Medical Decision Making:  60 year old female postop third right toe amputation secondary to autonecrosis secondary to HIT.  Patient had aortic valve replacement surgery about one month ago complicated by non-STEMI,CHF, ARF and AFIB. She does have outpatient cardiac follow-up. States compliance with listed medications as above. She is givien a wound care referral for continued care of her ischemic digits. She is instructed to stay on Coumadin at 6 mg daily. POC INR today of 1.9.  Will repeat INR check tomorrow.  She will continue all of her medications as listed above. Given follow-up here in about one month.    Follow up:  ONE Month.      TRANSITIONAL CARE MANAGEMENT CERTIFICATION:  I certify the following are  true:  1. Communication and/or attempts to communicate with the patient was made within 2 business days of discharge.  2. Complexity of Medical decision making is high.  3. Face to face visit occurred within 8 days of discharge.

## 2016-03-16 NOTE — Patient Instructions (Signed)
Current Outpatient Prescriptions:   .  acetaminophen (TYLENOL) 650 MG CR tablet, Take 1 tablet by mouth every 8 hours as needed for Pain., Disp: 30 tablet, Rfl: 0  .  aspirin 81 MG EC tablet, Take 1 tablet by mouth daily., Disp: , Rfl:   .  atorvastatin (LIPITOR) 20 MG tablet, Take 1 tablet by mouth every night at bedtime., Disp: 30 tablet, Rfl: 1  .  carvedilol (COREG) 6.25 MG tablet, Take 1 tablet by mouth 2 times daily with meals., Disp: 60 tablet, Rfl: 1  .  HYDROcodone-acetaminophen (NORCO) 5-325 mg per tablet, Take 1 tablet by mouth every 6 hours as needed for Pain., Disp: 30 tablet, Rfl: 0  .  levoFLOXacin (LEVAQUIN) 750 MG tablet, Take 1 tablet by mouth daily for 10 days., Disp: 8 tablet, Rfl: 0  .  warfarin (COUMADIN) 6 MG tablet, Take 1 tablet by mouth daily. Or as directed by [facility], Disp: 90 tablet, Rfl: 1

## 2016-03-17 ENCOUNTER — Ambulatory Visit: Admit: 2016-03-17 | Discharge: 2016-03-17

## 2016-03-17 DIAGNOSIS — Z5181 Encounter for therapeutic drug level monitoring: Secondary | ICD-10-CM

## 2016-03-17 LAB — PROTIME-INR: INR: 1.9 — AB (ref <=1.1)

## 2016-03-17 LAB — POCT INR
POC INR: 1.9 — ABNORMAL HIGH (ref 0.8–1.2)
POC PT: 23.4 s — ABNORMAL HIGH (ref 9.6–17)

## 2016-03-17 NOTE — Progress Notes (Signed)
INR RANGE: (2.0-3.0)    Subtherapeutic POC INR 1.9 during titration.  Denies missed doses.  Increase warfarin 14.3% to 48mg  weekly (6mg  daily except 9mg  Mon/Fri).   RTC and check INR in 9 days on 10/17@1 :45.    [facility] Coumadin Flowsheet INR   Latest Ref Rng & Units 0.9 - 1.1   03/16/2016 1.9 (A)   03/13/2016 1.3 (External Abn)   03/12/2016 1.2   03/11/2016 1.2   03/09/2016 2.0 (H)   03/06/2016 1.9 (External Abn)   03/02/2016 1.7 (External Abn)   02/28/2016 1.5 (External Abn)   02/26/2016 2.8 (H)   02/25/2016 2.8 (H)   02/24/2016 2.0 (H)   02/23/2016 1.8 (H)   02/22/2016 1.9 (H)   02/21/2016 1.8 (H)

## 2016-03-23 NOTE — Telephone Encounter (Signed)
Recheck INR in 2 weeks thanks

## 2016-03-23 NOTE — Telephone Encounter (Signed)
[place] FOLLOW UP CALL    Patient Information     Follow up call (time since discharge)?  2nd week    How is the patient feeling since last call?  Patient stated she is doing pretty good. She is continuing to wear life-vest 24/7. Patient reported she is continuing to have chest tenderness where AVR procedure occurred, she feels this is musculoskeletal discomfort. Denies SOB, chest discomfort, n  /v, fever or dizziness/lightheadednesss.     R index finger-   Patient stated she is experiencing numbness/tingling at index finger distal knuckle. She stated it is difficult to bend. She stated when she bends index finger she has discomfort radiatin  g up wrist. She stated she does not experience pain it is more discomfort. She stated when opening water bottle she has discomfort in this finger/hand.     She is continuing to apply iodine on toes and finger as instructed.     Gangrene of foot   She   is continuing to participate in wound care. She stated she has NOT removed bandage as instructed by Dr Sudie Bailey on toe. She stated she is letting wound care address this.     She reported she is using IS or deep breathing exercises daily. She is usin  g heart pillow when she is transfer or coughing. She does not monitor BP or HR at home.       Does the patient have the support needed at home?  Yes, patient has support at home    Was the patient seen for any of the following conditions?  AVR  Heart Failure    Additional Comments:  Patient resides with husband. Patient due to their home location they do not qualify for [facility].       Heart Failure     Is the patient a heart failure navigator patient? No      Has the patient had any change in peripheral edema? No, denies any changes     Does the patient weigh themselves daily? Yes, weighs daily. No weight gain noted     Does the patient have any difficulty with breathing and/or chest tightness? No     Additional Comments:  If on feet for a long period of time she has increased R foot  swelling. She is bilaterally elevating feet prn throughout the day.   03/23/2016- 198.2 lbs  03/22/2016- 196.4 lbs  Patient is speaking clearly and easily understandable. Patient is eating   a low sodium diet. Patient stated yesterday she ate at [facility]. She ate soup which had increased salt. She feels this may contribute to increase in weight today. She is speaking clearly and easily understandable.       and   Aortic Valve Replacement     Is the patient a heart failure navigator patient? No      Has the patient had any change in peripheral edema? No, denies any changes     Does the patient weigh themselves daily? Yes, weighs daily. No weight gain noted     Does the patient have any difficulty with breathing and/or chest tightness? No     How is patient's incision healing?       Additional Comments:  Coumadin is managed by coumadin clinic. She states she is complaint with medication.     Incision site (AVR) is healing well. Denies s/s of infection. She is continuing to have tenderness at incision site. This is improving.  Completion of Call     Are there any further questions or concerns the patient has prior to completing the call?Denies any questions or concerns at this time    Does the patient agree to additional follow up call(s)?  Yes     Additional Comments:  Please refer to above message regarding R hand discomfort.     03/28/2016 1:45 PM    [facility] ANTICOAG/MTM ROOM 2    [facility]        [facility]  03/29/2016 10:30 AM   Brayton Caves, PA          AP CVTS        [facility] Clinics  03/29/2016 2:50 PM    Charleston Ropes  m, FNP           RR WOUND       [facility] NI  04/17/2016  2:40 PM    [name], DO         [id number]         [facility] Clin[facility]  05/24/2016 2:00 PM    [name], DO         [email or web address]         APP Clinics

## 2016-03-23 NOTE — Telephone Encounter (Signed)
Agree with listed plan thanks

## 2016-03-24 NOTE — Telephone Encounter (Signed)
Discussed with Dr Sudie Bailey- patient should continue to watch R index finger. She is to notify our clinic with any s/s of infection or worsening/new sxs.     Pt is aware of the above information. Patient denied s/s of infection. She voiced understanding to return call with ANY additional questions or concerns.

## 2016-03-28 ENCOUNTER — Ambulatory Visit: Admit: 2016-03-28 | Discharge: 2016-03-28

## 2016-03-28 DIAGNOSIS — Z5181 Encounter for therapeutic drug level monitoring: Secondary | ICD-10-CM

## 2016-03-28 LAB — PROTIME-INR: INR: 2.9 — AB (ref <=1.1)

## 2016-03-28 LAB — POCT INR
POC INR: 2.9 — ABNORMAL HIGH (ref 0.8–1.2)
POC PT: 34.3 s — ABNORMAL HIGH (ref 9.6–17)

## 2016-03-28 NOTE — Progress Notes (Signed)
INR RANGE: (2-3)    Therapeutic POC INR 2.9 during titration.   Continue warfarin 48mg  weekly (6mg  daily except 9mg  every Mon/Fri).   Check INR and RTC in 10/31 on 10/31@12 :45.    [facility] Coumadin Flowsheet INR   Latest Ref Rng & Units 0.9 - 1.1   03/17/2016 1.9 (External Abn)   03/16/2016 1.9 (A)   03/13/2016 1.3 (External Abn)   03/12/2016 1.2   03/11/2016 1.2   03/09/2016 2.0 (H)   03/06/2016 1.9 (External Abn)   03/02/2016 1.7 (External Abn)   02/28/2016 1.5 (External Abn)   02/26/2016 2.8 (H)   02/25/2016 2.8 (H)

## 2016-03-29 ENCOUNTER — Institutional Professional Consult (permissible substitution): Admit: 2016-03-29 | Discharge: 2016-03-29 | Payer: MEDICAID | Attending: Family

## 2016-03-29 ENCOUNTER — Encounter: Admit: 2016-03-29 | Discharge: 2016-03-29 | Payer: MEDICAID | Attending: Surgical

## 2016-03-29 DIAGNOSIS — L97522 Non-pressure chronic ulcer of other part of left foot with fat layer exposed: Secondary | ICD-10-CM

## 2016-03-29 DIAGNOSIS — Z951 Presence of aortocoronary bypass graft: Secondary | ICD-10-CM

## 2016-03-29 MED ORDER — carvedilol (COREG) 6.25 MG tablet
6.25 | ORAL_TABLET | Freq: Two times a day (BID) | ORAL | 1 refills | 90.00000 days | Status: DC
Start: 2016-03-29 — End: 2016-04-25

## 2016-03-29 MED ORDER — atorvastatin (LIPITOR) 20 MG tablet
20 | ORAL_TABLET | Freq: Every evening | ORAL | 1 refills | 90.00000 days | Status: DC
Start: 2016-03-29 — End: 2016-04-25

## 2016-03-29 NOTE — Progress Notes (Signed)
Subjective:  Brandy [name] is a 60 y.o. female who on 01/27/2016 underwent an aortic valve replacement with a 23 mm magna Edwards pericardial tissue valve as well as insertion of intra-aortic balloon pump through the right femoral approach due to decreased ventricular ejection fraction preoperatively of 15 to 25%.   Unfortunately she had a long hospitalization involving multiple medical issues including H IT, non-STEMI, atrial fibrillation, renal failure, CHF, severe blood loss anemia, dysphagia, pulmonary insufficiency, and generalized weakness and deconditioning. She was ultimately discharged on her 21st day postoperatively to inpatient rehabilitation.  She was there for approximately 8 days and was subsequently discharged home and return to our office for her first follow-up appointment on 02/16/16. At that time her main complaint was her toes on her right foot they were quite sore and limiting her mobility.  Subsequently her middle right toe became gangrenous requiring readmission to the hospital and eventual amputation by Dr. [name] on 03/10/16. Afterwards she worked with physical therapy and was able to be discharged home again on 03/08/16.    Objective:  BP 112/62 (BP Location: Right arm, Patient Position: Sitting)  Pulse 84  Temp 36.9 ?C (98.4 ?F)  Resp 22  Ht 5' 6 (1.676 m)  Wt 200 lb 12.8 oz (91.1 kg)  SpO2 100%  BMI 32.41 kg/m2 No flowsheet data found.    Physical Exam:  GENERAL: she appears alert and oriented and in no apparent distress  LUNGS: Clear to auscultation bilaterally; no wheezes heard  HEART: Exam reveals a regular rate and rhythm  SKIN: Her sternal surgical wound appears warm and dry without evidence of infection, inflammation or fluid collection.  EXTREMITIES: There is mild edema in the right lower extremity and only trace in the left. She has persisting swelling of her right foot that seems to worsen when her legs are down for any period of time.      Diagnostics:  POCT INR  03/28/16  Order: [id number]   Status:  Final result ??Visible to patient:  No (Not Released) Next appt:  Today at 02:50 PM in Wound Care Bubba Hales, FNP)      Ref Range & Units 1d ago     POC INR 0.8 - 1.2 ? 2.9 (H)   POC PT 9.6 - 17 SEC 34.3 (H)                 Current Outpatient Prescriptions   Medication Sig Dispense Refill   . acetaminophen (TYLENOL) 650 MG CR tablet Take 1 tablet by mouth every 8 hours as needed for Pain. 30 tablet 0   . aspirin 81 MG EC tablet Take 1 tablet by mouth daily.     Marland Kitchen atorvastatin (LIPITOR) 20 MG tablet Take 1 tablet by mouth every night at bedtime. 30 tablet 1   . carvedilol (COREG) 6.25 MG tablet Take 1 tablet by mouth 2 times daily with meals. 60 tablet 1   . furosemide (LASIX) 40 MG tablet Take 20 mg by mouth daily. Change dosage to 20 mg tabs; one daily     . HYDROcodone-acetaminophen (NORCO) 5-325 mg per tablet Take 1 tablet by mouth every 6 hours as needed for Pain. 30 tablet 0   . warfarin (COUMADIN) 6 MG tablet Take 1 tablet by mouth daily. Or as directed by [facility] (Patient taking differently: Take 6 mg by mouth daily. Or as directed by [facility] & Fri take 9mg ) 90 tablet 1     No current facility-administered medications for  this visit.         Impression:  Satisfactory progress status post:    SNOMED CT(R)   1. S/P CABG (coronary artery bypass graft)  HISTORY OF CORONARY ARTERY BYPASS GRAFTING        recomendations:  Karlynn Furrow returns to the office today company by her husband stating that she feels very good and is very thankful and has no specific concerns or complaints. She has been recovering from her amputation of her right third digit and is due to see Dr. Margot Ables again this afternoon.  From a cardiac perspective she is doing quite well and has been very compliant in wearing her [employer] life vest. Dr. Debroah Baller saw her briefly as well this morning and decided she no longer needs to wear the LifeVest as her heart function is normal now. Her vital signs look good, heart  sounds normal, sternum intact, chest incision looks good. Her last INR was 2.9 taking Coumadin 6 mg daily except Monday and Friday when she takes 9.  I reduced her Lasix prescription to 20 mg daily. She has not been taking any potassium I therefore ordered a BMP for next week and we'll try to get her into see her cardiologist Dr. Harlen Labs within the next couple of weeks as she is likely behind schedule for her cardiology follow-up appointment. We have like to see her again in a couple of months. I've encouraged her to continue to monitor her weight and blood pressure and to elevate her legs as best as possible. She will see Dr. Margot Ables this afternoon to further evaluate her right toes. I've encouraged her to call us with any questions or concerns.

## 2016-03-29 NOTE — Progress Notes (Signed)
HISTORY AND ASSESSMENT:  Brandy [name] is a 60 y.o. female here for Follow Up Care Of Ulcer.  Patient is accompanied by spouse. No  Dressing .  Patient reporting decreased pain.  Oral pain medication taken by patient pre-visit? No.     Initial Nursing Assessment & Intervention   ADVANCED DIRECTIVES: No  POLST: No  Making health Care Decisions booklet given? No   MOBILITY:   Transfers: Independent.  Falls within 3 months: No  Physical Aids used in mobility: Walker  Fall Risk and interventions: Low risk/Standard fall prevention   PAIN ASSESSMENT: Teach patient/family the following principles:  Patient's Personal Pain Management Comfort Goal (0-10 scale): 3  Do You Currently Have  Pain/Discomfort?  mild.  knife-like right foot/feet.  Treatments: Tylenol.    NUTRITION SCREEN  Weight change >10lb in 3 months? No  High risk Dx: Pneumonia and CHF.  Psycho-Social: weather  Risk Determination/Intervention: High Risk (>1 indicator)  Request Diabetic Educator Consult? No  Pt given nutritional assessment for N/A  Other interventions:  No    SOCIAL WORK SERVICES  Domestic violence screen for patients 65 years old and older ask this question in total privacy: Are you currently, or have you ever been in a situation where you were physically hurt, threatened, or made to feel afraid?: No  Referral needed/ordered: No   LEARNING SCREEN  Primary Language: English  Needs Interpreter? Yes  Learning Readiness: Receptive  Barriers:  none , can read and write adequately.   OTHER         See these sections of Medical Record for additional information related to this encounter:    PAST MEDICAL HISTORY  PAST SURGICAL HISTORY  SOCIAL HISTORY  ALLERGIES  MEDICATIONS  FAMILY HISTORY  VITAL SIGNS FLOW SHEET  POC GLUCOSE  OUTPATIENT WOUND ASSESSMENT FLOW SHEET  ENCOUNTER DIAGNOSIS  PROBLEM LIST  ORDERS    TREATMENT:  Patient assisted with undressing and redressing? Yes  Patient prepped and positioned for care? Yes  Patient required  repositioning for additional care? No  1:1 nursing staff required for wound care and positioning patient.  Cleansed periwound with normal saline.   Poor hygiene required need for extensive cleansing?  No    COORDINATION OF CARE:  Orders given at the time of visit to patient and family  Referrals sent to N/A.  Visit information sent to N/A.    PATIENT EDUCATON:  Instructed verbally regarding wound care procedure  Reinforced self care measures for N/A.  Patient had no questions and is in agreement with this plan of care.    The patient and family verbalizes understanding of instruction.    See these sections of Medical Record for additional information related to this encounter:    PATIENT INSTRUCTIONS  FOLLOWUP  SUPPLIES SENT HOME WITH PATIENT FLOW SHEET

## 2016-03-29 NOTE — Progress Notes (Signed)
Dear Brandy Berthold, Do  [address]  [address]  [place], [place] [address],    Thank you for referring Brandy [name] to the Advanced Wound Center at [facility]  It was my pleasure to evaluate her. Please see my full consultation note below and do not hesitate to contact me with any questions or concerns regarding [facility].     Once again, thank you for the opportunity to participate in [facility] care.    Sincerely,    Bubba Hales, FNP  Advanced Wound Center  [facility]    PRIMARY CARE PROVIDER:  [name], DO    REFERRING PROVIDER: [name], DO    REASON FOR REFERRAL: Follow Up Care Of Ulcer    HISTORY OF PRESENT ILLNESS:  Date of Presentation to wound center: 03/29/2016  Date of Occurrence: 01/28/16  Wound Location: right second and fourth toes, right pointer finger  Wound Cause:  HITT  Admissions or Prior Evaluations: Dr Margot Ables, took off the right third toe. While in hospital. [facility] is instructed to follow up with him for the right third toe.  Topical Treatments & ABX: betadine  Related LABS/CULTURES of wound: denies  Related IMAGING:   denies  Off Loading: NA  Compression: NA  Prior Lower Extrem Vascular Surgery denies  LAST Tetanus Vaccine:  Close to 10 years. She is instructed to discuss with Dr. Sudie Bailey  DME Ordered: none today  Social/HH: no home health. Lives outside of [place], about 20 miles lives with her husband, 2 llamas, dogs, chickens    LABS:    Recent Results (from the past 168 hour(s))   Protime Panel -Routine    Collection Time: 03/28/16 12:00 AM   Result Value Ref Range    INR 2.9 (External Abn) 0.9 - 1.1   POCT INR    Collection Time: 03/28/16  1:34 PM   Result Value Ref Range    POC INR 2.9 (H) 0.8 - 1.2      POC PT 34.3 (H) 9.6 - 17 SEC       At this time patient's impression is that recently the wound has gotten :  better    Patient is protecting wound with water proof dressing in shower (instructions importance of keeping wound protected  given)    Previous chronic wounds: denies  denies travel out of the country in temporal relation to wound  Ambulatory Status: no assistive devices    ADV DIRECTIVE:  FULL CODE    Past Medical History:   Diagnosis Date   . History of blood transfusion    . 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 AORTIC VALVE REPLACEMENT     . PROCEDURE N/A 01/27/2016    Procedure: AORTIC VALVE REPLACEMENT; INTRA AORTIC BALLOON INSERTION;  Surgeon: Otho Bellows, MD;  Location: [facility] OR;  Service: Cardiology;  Laterality: N/A;   . PROCEDURE Right 03/10/2016    Procedure: AMPUTATION TOE;  Surgeon: Chrystie Nose, DPM;  Location: [facility] OR;  Service: Podiatry;  Laterality: Right;   . TONSILLECTOMY     . TUBAL LIGATION  1980       Current Outpatient Prescriptions   Medication Sig Dispense Refill   . acetaminophen (TYLENOL) 650 MG CR tablet Take 1 tablet by mouth every 8 hours as needed for Pain. 30 tablet 0   . aspirin 81 MG EC tablet Take 1 tablet by mouth  daily.     . atorvastatin (LIPITOR) 20 MG tablet Take 1 tablet by mouth every night at bedtime. 30 tablet 1   . carvedilol (COREG) 6.25 MG tablet Take 1 tablet by mouth 2 times daily with meals. 60 tablet 1   . furosemide (LASIX) 40 MG tablet Take 20 mg by mouth daily. Change dosage to 20 mg tabs; one daily     . HYDROcodone-acetaminophen (NORCO) 5-325 mg per tablet Take 1 tablet by mouth every 6 hours as needed for Pain. 30 tablet 0   . warfarin (COUMADIN) 6 MG tablet Take 1 tablet by mouth daily. Or as directed by [facility] (Patient taking differently: Take 6 mg by mouth daily. Or as directed by Marijean Heath & Fri take 9mg ) 90 tablet 1     No current facility-administered medications for this visit.        Allergies   Allergen Reactions   . Heparin Anaphylaxis     Possible HIT   . Sulfa (Sulfonamide Antibiotics) Rash     In ICU for 3 days post dose         Family History   Problem Relation Age of Onset   . Cancer  Mother    . Heart disease Father    . Heart disease Sister    . Cancer Brother    . Cancer Maternal Aunt    . Heart disease Maternal Aunt    . Cancer Maternal Uncle    . Heart disease Maternal Uncle        Social History     Social History   . Marital status: Married     Spouse name: N/A   . Number of children: N/A   . Years of education: N/A     Occupational History   . Not on file.     Social History Main Topics   . Smoking status: Passive Smoke Exposure - Never Smoker   . Smokeless tobacco: Never Used   . Alcohol use No   . Drug use: No   . Sexual activity: Not Currently     Partners: Male     Other Topics Concern   . Not on file     Social History Narrative       ROS    PHYSICAL EXAM:   VITAL SIGNS:  BP: 118/66  Pulse: 69  Respirations: 18  Temperature: 36.8 ?C (98.2 ?F)  Oxygen Saturation: 97 %  O2 Device: None (Room air)                              GENERAL:  Patient is awake alert acting appropriately and in no distress with clear well articulated speech.  CARDIOVASCULAR: Regular rhythm.  No murmurs.  PULMONARY:  Lungs clear to ascultation bilaterally.  No increased work of breathing.  ABDOMEN:  Soft, nontender, without guarding.   NEURO:  MAE, Nonfocal    LOWER EXTREMITIES:      (Y=Present  N=Negative or not present  Empty field=not assessed)  (Edema Scale:0=None  1=Mild   2=Moderate  3=Severe)        DERMAL APPENDAGES:   Nails: are not Thickened discolored:  There is Hair growth below knee: Bilaterally    CIRCULATION-VENOUS-LYMPH:   Right Left Bilateral   DP Pulse triphasic     PT Pulse biphasic     Digit Refill (<sec) 2     Edema (0-3+) no  Hemosid no     Varicosities no     Palor/Cyanosis no     Distal Temp  warm       SENSATION:    Light touch: is  normal  Bilaterally        MOTOR:    Foot Deformities: none noted. 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  Age of wound at [facility]: 8.71 number of weeks.  Treatment  Week #: 0. Measures 1.5 cm X 1.4 cm X 0 cm.                                                                                    Wound Area: 2.1 cm2           Margins: Attached   Tissue Type: subcutaneous, eschar   Tissue Quality: necrotic, dry                   Drainage Amount: None       Wound is:: improved   Peri-wound Assessment: mild erythema   Peri-wound is:: stable   Pain since last visit: 1      Wound Number: 2 Right  Fourth Toe.  Age of wound at [facility]: 8.71 number of weeks.  Treatment Week #: 0.  Measures 1.2 cm X 0.8 cm X 0 cm.                                                                                    Wound Area: 0.96 cm2           Margins: Attached   Tissue Type: subcutaneous, eschar   Tissue Quality: necrotic, dry                   Drainage Amount: None       Wound is:: improved   Peri-wound Assessment: mild erythema   Peri-wound is:: stable   Pain since last visit: 2    Wound Number: 3 Right  Second Finger.  Age of wound at [facility]: 8.71 number of weeks.  Treatment Week #: 0.  Measures 1 cm X 1 cm X 0 cm.                                                                                    Wound Area: 1 cm2           Margins: Attached  Tissue Type: subcutaneous, eschar   Tissue Quality: necrotic, dry                   Drainage Amount: None       Wound is:: improved   Peri-wound Assessment: mild erythema   Peri-wound is:: stable   Pain since last visit: 0    Wound Number: 1  Second Toe  Right   Wound treatment orders: Orders for new wound Plan of Care initiated, treatment ordered as performed below.   Peri-wound cleansed with: Betadine   Wound cleansed with: Betadine and     Lidocaine applied: no               Wound will be redressed with                          First Layer: Betadine                                                elastomol    Wound Number: 2  Fourth Toe  Right   Wound treatment orders: Orders for new wound Plan of Care initiated, treatment ordered as performed below.   Peri-wound cleansed  with: Betadine   Wound cleansed with: Betadine and     Lidocaine applied: no               Wound will be redressed with                          First Layer: Betadine                                                elastomol    Wound Number: 3  Second Finger  Right   Wound treatment orders: Orders for new wound Plan of Care initiated, treatment ordered as performed below.   Peri-wound cleansed with: Betadine   Wound cleansed with: Betadine and     Lidocaine applied: no               Wound will be redressed with                          First Layer: Betadine                                                elastomol      DIAGNOSIS:  The following is the patient's primary diagnosis and any associated comorbidities that effect or could slow patient's healing   Encounter Diagnoses   Name Primary?   . 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 Impaired activities of daily living    . S/P AVR (aortic valve replacement)    . Heparin induced thrombocytopenia (CMS/HCC)    . 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 Recent AVR so she will need antibiotics prior to [place] doing any extensive debridement. These three thrombus wounds caused by HITT appear to be healing well. No cellulitis. Will have her return Q 2 weeks for conservative debridement and following the wound.      Patient wound appears:  ischemic wounds caused by thrombus associated with HITT  Patient had no questions and is in agreement with this plan of care.  New concerns with Circulation/Hypoxia   no  Requiring debridement (sharp, enzymatic or autolytic employed)  yes  Signs or symptoms of Infection or is inflammation a problem   no  Is edema a concern:   no  Is moisture balance in wound a current problem   no  Is off loading, shear or repetitive trauma an issue   no   Diagnostics/ Referrals ordered     No orders of the defined types were placed in this encounter.    Advaced Therapies/ RXs ordered    New Prescriptions    No medications on file     Systemic Issues/Education  Patient education and self care measures reinforced today regarding: wound care procedure and skin care    All patient's questions were answered and plan fully discussed with patient    FOLLOW UP:  Return in about 2 weeks (around 04/12/2016).    This chart was produced with the EPIC system using voice recognition software, manual transcription, or both. Despite concurrent proofreading, please note that transcription errors are common and may not reflect the true intent of reporter.

## 2016-04-04 NOTE — Telephone Encounter (Addendum)
Please request for furosemide 40 mg refill (' no refills' on bottle). [facility] [phone]. Her caridiology/ Dr. Crissie Figures appointment  is on 04/19/16. Labs will be taken on 10/31/1 BMP at [facility] per Jonny Ruiz.  Addendum;  Clarified with Braxton that lasix and kcl were to be stopped per the discharge summary on 03/13/16.  She states that she was told to stop kcl but not lasix, so she has been taking 40 mg daily.  She denies any lower extremity edema or sob.  Advised her that we would not be refilling the lasix and instructed her to continue daily weights same scale/time, to call our office if wt increases 2 lbs overnight or 3-5 lbs in 5-7 days, any increased sob or other concerns.  She voiced understanding and agreement.

## 2016-04-05 NOTE — Telephone Encounter (Signed)
Verbally confirmed below message with Dr Sudie Bailey. Patient is aware of the below message. Patient read back instructions to ensure understanding. Patient had no additional questions or concerns. Patient verbally stated she is doing well. Denies SOB or chest pain.      Future Appointments  Date Time Provider Department Center   04/11/2016 12:45 PM [facility] ANTICOAG/MTM ROOM 2 [facility]   04/13/2016 1:50 PM Bubba Hales, FNP RR WOUND [facility]   04/17/2016 2:40 PM [name], DO [facility]   04/27/2016 1:30 PM Bubba Hales, FNP RR WOUND [facility]   05/24/2016 2:00 PM [name], DO [facility]   05/31/2016 1:30 PM Otho Bellows, MD AP CVTS [facility]

## 2016-04-05 NOTE — Telephone Encounter (Signed)
Dr Sudie Bailey reviewed chart and recent chart notes. Will await lab results next week.

## 2016-04-05 NOTE — Telephone Encounter (Signed)
Per telephone note 04/04/2016 patient was instructed to stop lasix and K+ and this would not be re-filled as discharge instructions from [facility] stated to stop taking K+ and Lasix. However, during OV with Brayton Caves, PA 03/29/2016 I reduced her Lasix prescription to 20 mg daily. She has not been taking any potassium I therefore ordered a BMP for next week and we'll try to get her into see her cardiologist Dr. Harlen Labs within the next couple of weeks as she is likely behind schedule for her cardiology follow-up appointment.     Per Dr Sudie Bailey patient should be taking lasix 20 mg qd and K+ 10 mEq qd. Patient should have BNP and BMP completed next week. Patient is being seen by Dr Sudie Bailey 04/17/2016.     Contacted [facility]: Ian Malkin MD  Spoke w/Patsi who stated patient is scheduled 04/19/2016 1345.    Patient stated at last OV life vest was discontinued by Dr Debroah Baller, MD and Margit Hanks, PA. She stated she returned life vest.

## 2016-04-05 NOTE — Telephone Encounter (Signed)
Please have patient take Lasix 20 mg daily with potassium supplement 10 mEq daily  Will obtain basic metabolic panel along with BMP prior to next visit.  Otherwise good with plan thanks

## 2016-04-05 NOTE — Telephone Encounter (Signed)
[place] FOLLOW UP CALL    Patient Information     Follow up call (time since discharge)?  3rd week    How is the patient feeling since last call?  Patient stated she is very confused regarding lasix rx. She remembers she was informed to reduce lasix and then was told to stop taking lasix yesterday. Denies SOB, chest discomfort or n/v. She is speaking clearly and easily understandable. She state  d she is continuing to have mild swelling and inability to bend R index finger. Denies s/s of infection.     Does the patient have the support needed at home?  Yes, patient has support at home    Was the patient seen for any of the following conditions?  Heart Failure    Completion of Call     Are there any further questions or concerns the patient has prior to completing the call?  Denies any questions or concerns at this time    Does the patient agree to additional follow up call(s)?  Yes     Additional Comments:  1. Could she have nerve damage in finger/foot causing pain and tingling? If yes, would this resolve over time?     04/11/2016 12:45 PM   [facility] ANTICOAG/MTM ROOM 2    [facility]        [facility]  04/13/2016  1:50 PM    Bubba Hales, FNP           RR WOUND         [facility]  04/17/2016  2:40 PM    [name], DO         [email or web address]         [facility]  04/27/2016 1:30 PM    Bubba Hales, FNP           RR WOUND       [facility]  05/24/2016 2:00 PM    [name], DO         [email or web address]         [facility]  12/20/2  017 1:30 PM    Otho Bellows, MD      AP CVTS        [facility]

## 2016-04-11 ENCOUNTER — Other Ambulatory Visit: Admit: 2016-04-11

## 2016-04-11 ENCOUNTER — Ambulatory Visit: Admit: 2016-04-11 | Discharge: 2016-04-11

## 2016-04-11 DIAGNOSIS — Z5181 Encounter for therapeutic drug level monitoring: Secondary | ICD-10-CM

## 2016-04-11 DIAGNOSIS — Z951 Presence of aortocoronary bypass graft: Secondary | ICD-10-CM

## 2016-04-11 DIAGNOSIS — I998 Other disorder of circulatory system: Secondary | ICD-10-CM

## 2016-04-11 LAB — PRO B-TYPE NATRIURETIC PEPTIDE (SLM): Pro B-Type Natriuretic Peptide: 623 pg/mL (ref 0–900)

## 2016-04-11 LAB — BASIC METABOLIC PANEL
Anion Gap: 15 mmol/L (ref 12.0–20.0)
BUN / Creatinine Ratio: 17.7 (ref 12.0–20.0)
BUN: 11 mg/dL (ref 8–20)
CO2 - Carbon Dioxide: 25 mmol/L (ref 17–27)
Calcium: 10.8 mg/dL — ABNORMAL HIGH (ref 8.6–10.6)
Chloride: 104 mmol/L (ref 101.0–111.0)
Creatinine: 0.62 mg/dL (ref 0.60–1.30)
Glomerular Filtration Rate Estimate (Female): >60 mL/min/{1.73_m2} (ref >=60.0)
Glucose: 78 mg/dL (ref 74–106)
Osmolality Calculation: 278 mOsm/kg (ref 275.0–300.0)
Potassium: 3.7 mmol/L (ref 3.50–5.10)
Sodium: 140 mmol/L (ref 135–145)

## 2016-04-11 LAB — POCT INR
POC INR: 1.4 — ABNORMAL HIGH (ref 0.8–1.2)
POC PT: 16.4 s (ref 9.6–17)

## 2016-04-11 LAB — PROTIME-INR: INR: 1.4 — AB (ref <=1.1)

## 2016-04-11 MED ORDER — furosemide (LASIX) 40 MG tablet
40 | ORAL_TABLET | Freq: Every day | ORAL | 2 refills | 30.00000 days | Status: DC
Start: 2016-04-11 — End: 2016-04-12

## 2016-04-11 NOTE — Progress Notes (Signed)
INR RANGE: (2-3)    Subtherapeutic POC INR 1.4 during titration.  Denies missed doses. Rapid drop (from 2.9 to 1.4) most likely due to fluid loss, increased overall oral intake, and increased mobility. Patient is waiting to find out it lasix and potassium are to be discontinued. She is still swollen in the right leg and right hand, but wants to stop the medications.    I let her know if her swelling returns to let me know because it can cause a rapid increase in INR.    Increase warfarin 12.5% to 54mg  weekly (9mg  daily except 6mg  Mon/Wed/Fri).   RTC and check INR in 14 days on 11/14@2 :15.    [facility] Coumadin Flowsheet POC INR Hematocrit Hemoglobin   Latest Ref Rng & Units 0.8 - 1.2 35.0 - 48.0 % 11.7 - 16.5 g/dL   16/03/9603 2.9 (H)     03/17/2016 1.9 (H)     03/16/2016      03/13/2016 1.3 (H)     03/12/2016      03/11/2016      03/09/2016      03/06/2016 1.9 (H) 37.5 12.1   03/02/2016 1.7 (H)     02/28/2016 1.5 (H)

## 2016-04-11 NOTE — Telephone Encounter (Signed)
Pt wants to know if she is to continue taking lasix.  If so, she needs a refill.    LV 03/16/16, NV 04/17/16.

## 2016-04-11 NOTE — Telephone Encounter (Signed)
Medication Being Requested:  furosemide (LASIX) 40 MG tablet    Send To Pharmacy     [facility] [address] [place], [place] - [address]  [address]  [facility] [place] [address]  Phone: [phone] Fax: [phone]      How many Days Remaining of medication: 0    Additional Information:     Patient is caling to see if she needs to continue this medication she is out and if she does need to continue, she needs a new medication called in. Please call back at  [phone]    **Informed patient that it may take up to 48-72 hours to fill prescription.**    Future Appointments:  Future Appointments  Date Time Provider Department Center   04/11/2016 12:45 PM [facility] ANTICOAG/MTM ROOM 2 [facility]   04/13/2016 1:50 PM Bubba Hales, FNP RR WOUND [facility]   04/17/2016 2:40 PM [name], DO [facility]   04/27/2016 1:30 PM Bubba Hales, FNP RR WOUND [facility]   05/24/2016 2:00 PM [name], DO [email or web address]   05/31/2016 1:30 PM Otho Bellows, MD AP CVTS [facility]

## 2016-04-12 MED ORDER — furosemide (LASIX) 20 MG tablet
20 | ORAL_TABLET | Freq: Every day | ORAL | 2 refills | 30.00000 days | Status: DC
Start: 2016-04-12 — End: 2016-04-17

## 2016-04-12 NOTE — Telephone Encounter (Signed)
-----   Message from [name], DO sent at 04/11/2016  3:37 PM PDT -----  Results BNP 623 much improved .Please notify patient.

## 2016-04-12 NOTE — Telephone Encounter (Signed)
Notified patient of results below and sent in refill of lasix per Dr. Sudie Bailey.

## 2016-04-13 ENCOUNTER — Institutional Professional Consult (permissible substitution): Admit: 2016-04-13 | Discharge: 2016-04-13 | Payer: MEDICAID | Attending: Family

## 2016-04-13 DIAGNOSIS — L97512 Non-pressure chronic ulcer of other part of right foot with fat layer exposed: Secondary | ICD-10-CM

## 2016-04-13 NOTE — Progress Notes (Signed)
PRIMARY CARE PROVIDER:  [name], DO    REFERRING PROVIDER: [name], DO    REASON FOR REFERRAL: Follow Up Care Of Wound    HISTORY OF PRESENT ILLNESS:  Date of Presentation to wound center: 04/13/2016  Date of Occurrence: 01/28/16  Wound Location: right second and fourth toes, right pointer finger  Wound Cause:  HITT  Admissions or Prior Evaluations: Dr Margot Ables, took off the right third toe. While in hospital. [facility] is instructed to follow up with him for the right third toe.  Topical Treatments & ABX: betadine  Related LABS/CULTURES of wound: denies  Related IMAGING:   denies  Off Loading: NA  Compression: NA  Prior Lower Extrem Vascular Surgery denies  LAST Tetanus Vaccine:  Close to 10 years. She is instructed to discuss with Dr. Sudie Bailey  DME Ordered: none today  Social/HH: no home health. Lives outside of [place], about 20 miles lives with her husband, 2 llamas, dogs, chickens    LABS:    Recent Results (from the past 168 hour(s))   Protime Panel -Routine    Collection Time: 04/11/16 12:00 AM   Result Value Ref Range    INR 1.4 (External Abn) 0.9 - 1.1   POCT INR    Collection Time: 04/11/16 12:37 PM   Result Value Ref Range    POC INR 1.4 (H) 0.8 - 1.2      POC PT 16.4 9.6 - 17 SEC   Basic Metabolic Panel -Routine    Collection Time: 04/11/16  1:03 PM   Result Value Ref Range    Sodium 140 135 - 145 mmol/L    Potassium 3.70 3.50 - 5.10 mmol/L    Chloride 104.0 101.0 - 111.0 mmol/L    CO2 - Carbon Dioxide 25 17 - 27 mmol/L    Creatinine 0.62 0.60 - 1.30 mg/dL    BUN 11 8 - 20 mg/dL    BUN / Creatinine Ratio 17.7 12.0 - 20.0    Glucose 78 74 - 106 mg/dL    Calcium 32.3 (H) 8.6 - 10.6 mg/dL    Anion Gap 55.7 32.2 - 20.0 mmol/L    Glomerular Filtration Rate Estimate (Female) >60.0 >=60.0 mL/min/1.68m*2    GFR Additional Info      Osmolality Calculation 278.0 275.0 - 300.0 mOsm/kg   Pro BNP (SLM) -Routine    Collection Time: 04/11/16  1:24 PM   Result Value Ref Range    Pro B-Type Natriuretic Peptide  623 0 - 900 pg/mL       SUBJECTIVE:  Patient reports decreased pain, no odor, no drainage feeling well, no new concerns and no problems with wound.   Dressing changes performed by patient since last visit: painting with betadine daily  Patient is accompanied by patient and spouse.   Patient's impression is that the wound is: improved.    History of Extremity arterial disease, extremity bypass or extremity stent: no  History of Heart Valve replacement or disease requiring endocarditis prophalaxis: yes      Patient is protecting wound with water proof dressing in shower (instructions importance of keeping wound protected given)    Previous chronic wounds: denies  denies travel out of the country in temporal relation to wound  Ambulatory Status: no assistive devices    ADV DIRECTIVE:  FULL CODE    Past Medical History:   Diagnosis Date   . History of blood transfusion    . 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 AORTIC VALVE REPLACEMENT     . PROCEDURE N/A 01/27/2016    Procedure: AORTIC VALVE REPLACEMENT; INTRA AORTIC BALLOON INSERTION;  Surgeon: Otho Bellows, MD;  Location: [facility] OR;  Service: Cardiology;  Laterality: N/A;   . PROCEDURE Right 03/10/2016    Procedure: AMPUTATION TOE;  Surgeon: Chrystie Nose, DPM;  Location: [facility] OR;  Service: Podiatry;  Laterality: Right;   . TONSILLECTOMY     . TUBAL LIGATION  1980       Current Outpatient Prescriptions   Medication Sig Dispense Refill   . acetaminophen (TYLENOL) 650 MG CR tablet Take 1 tablet by mouth every 8 hours as needed for Pain. 30 tablet 0   . aspirin 81 MG EC tablet Take 1 tablet by mouth daily.     Marland Kitchen atorvastatin (LIPITOR) 20 MG tablet Take 1 tablet by mouth every night at bedtime. 30 tablet 1   . carvedilol (COREG) 6.25 MG tablet Take 1 tablet by mouth 2 times daily with meals. 60 tablet 1   . furosemide (LASIX) 20 MG tablet Take 1 tablet by mouth daily. Please fill ASAP and call  when filled for patient to pick up today. 30 tablet 2   . HYDROcodone-acetaminophen (NORCO) 5-325 mg per tablet Take 1 tablet by mouth every 6 hours as needed for Pain. 30 tablet 0   . potassium chloride SA (K-DUR,KLOR-CON) 20 MEQ tablet Take 10 mEq by mouth daily.     Marland Kitchen warfarin (COUMADIN) 6 MG tablet Take 1 tablet by mouth daily. Or as directed by [facility] (Patient taking differently: Take 6 mg by mouth daily. Or as directed by [facility] & Fri take 9mg ) 90 tablet 1     No current facility-administered medications for this visit.        Allergies   Allergen Reactions   . Heparin Anaphylaxis     Possible HIT   . Sulfa (Sulfonamide Antibiotics) Rash     In ICU for 3 days post dose         Family History   Problem Relation Age of Onset   . Cancer Mother    . Heart disease Father    . Heart disease Sister    . Cancer Brother    . Cancer Maternal Aunt    . Heart disease Maternal Aunt    . Cancer Maternal Uncle    . Heart disease Maternal Uncle        Social History     Social History   . Marital status: Married     Spouse name: N/A   . Number of children: N/A   . Years of education: N/A     Occupational History   . Not on file.     Social History Main Topics   . Smoking status: Passive Smoke Exposure - Never Smoker   . Smokeless tobacco: Never Used   . Alcohol use No   . Drug use: No   . Sexual activity: Not Currently     Partners: Male     Other Topics Concern   . Not on file     Social History Narrative       Review of Systems   General/Constitutional: Negative for fever and chills.   Gastrointestinal: Negative for nausea and vomiting.       PHYSICAL EXAM:   VITAL SIGNS:  GENERAL:  Patient is awake alert acting appropriately and in no distress with clear well articulated speech.  PULMONARY:    No increased work of breathing.  ABDOMEN:  Soft, nontender, without guarding.   NEURO:  MAE, Nonfocal    LOWER EXTREMITIES:      (Y=Present  N=Negative or not present  Empty field=not  assessed)  (Edema Scale:0=None  1=Mild   2=Moderate  3=Severe)        DERMAL APPENDAGES:   Nails: are not Thickened discolored:  There is Hair growth below knee: Bilaterally    CIRCULATION-VENOUS-LYMPH:   Right Left Bilateral   DP Pulse triphasic     PT Pulse biphasic     Digit Refill (<sec) 2     Edema (0-3+) no     Hemosid no     Varicosities no     Palor/Cyanosis no     Distal Temp  warm       SENSATION:    Light touch: is  normal  Bilaterally        MOTOR:    Foot Deformities: none noted. 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  Age of wound at [facility]: 8.71 number of weeks.  Treatment Week #: 2.14. Measures 1 cm X 0.8 cm X  .                                                                                    Wound Area: 0.8 cm2           Margins: Attached   Tissue Type: subcutaneous, eschar   Tissue Quality: necrotic, dry                   Drainage Amount: None       Wound is:: improved   Peri-wound Assessment: clear   Peri-wound is:: improved   Pain since last visit: 0 (only painful to touch)      Wound Number: 2 Right  Fourth Toe.  Age of wound at [facility]: 8.71 number of weeks.  Treatment Week #: 2.14.  Measures 0.8 cm X 0.7 cm X  .                                                                                    Wound Area: 0.56 cm2           Margins: Attached   Tissue Type: subcutaneous, eschar   Tissue Quality: necrotic, dry                   Drainage Amount: None       Wound is:: improved   Peri-wound Assessment: clear   Peri-wound is:: improved   Pain since last visit:  0 (only painful to touch)    Wound Number: 3 Right  Second Finger.  Age of wound at [facility]: 8.71 number of weeks.  Treatment Week #: 2.14.  Measures 0.8 cm X 0.6 cm X  .                                                                                    Wound Area: 0.48 cm2           Margins: Attached   Tissue Type: subcutaneous, eschar   Tissue Quality:  necrotic, dry                   Drainage Amount: None       Wound is:: improved   Peri-wound Assessment: mild erythema   Peri-wound is:: stable   Pain since last visit: 0 (only painful to touch)    Wound Number: 1  Second Toe  Right   Wound treatment orders: treatment ordered as performed below.   Peri-wound cleansed with: 3N1 Foam     and     Lidocaine applied: no               Wound will be redressed with                          First Layer: Betadine                                                    Wound Number: 2  Fourth Toe  Right   Wound treatment orders: treatment ordered as performed below.   Peri-wound cleansed with: 3N1 Foam     and     Lidocaine applied: no               Wound will be redressed with                          First Layer: Betadine                                                    Wound Number: 3  Second Finger  Right   Wound treatment orders: treatment ordered as performed below.   Peri-wound cleansed with: 3N1 Foam     and     Lidocaine applied: no               Wound will be redressed with                          First Layer: Betadine  DEBRIDEMENT PROCEDURE NOTE    PARQ Risks discussed with patient including bleeding, pain infection and damage to deeper tissues.  All questions were answered and patient wanted to proceed    Based on my assessment the patient required sharp debridement today.  Indications for debridement  _x__ Nonviable tissue/wound debris--to leave healthier wound base  ___ Undermined margin-to leave more attached  wound margins  ___ Rolled margin-to leave more attached and/or beveled margins     ___ To bevel wound margins  ___ Callus--To leave more pliable skin   _x__ To trim back Loose edge of Fibrotic Cap or Eschar  ___ Trim Hyperkeratotic Epithelium from wound edge  ___ Removed Rolled Edge  ___ To obtain tissue culture    Wound Number: 1  Second Toe  Right   Peri-wound cleansed with: 3N1 Foam          Lidocaine  applied: no   Wound did not require topical anesthesia.   Excisional Debridement By Provider: epidermal/dermal eschar   Percent of wound debrided: 50 %   Tissue debrided using: Tissue nippers   Post debridement cleansed with:: Betadine  Gauze                                       First Layer: Betadine                                                   Pain after wound care: controlled   Dressing changes: daily       Wound Number: 2  Fourth Toe  Right   Peri-wound cleansed with: 3N1 Foam          Lidocaine applied: no   Wound did not require topical anesthesia.   Excisional Debridement By Provider: epidermal/dermal eschar   Percent of wound debrided: 30 %   Tissue debrided using: Tissue nippers   Post debridement cleansed with:: Betadine  Gauze                                       First Layer: Betadine                                                   Pain after wound care: controlled   Dressing changes: daily      Wound Number: 3  Second Finger  Right   Peri-wound cleansed with: 3N1 Foam          Lidocaine applied: no   Wound did not require topical anesthesia.   Excisional Debridement By Provider: epidermal/dermal eschar   Percent of wound debrided: 40 %   Tissue debrided using: Tissue nippers, Sanding bur   Post debridement cleansed with:: Betadine  Gauze                                       First Layer: Betadine  Pain after wound care: controlled   Dressing changes: daily      Patient tolerated the procedure well with less than 0 ML blood loss.      Debridement was successful and post Debridement the site showed a healthier, more viable base.             DIAGNOSIS:  The following is the patient's primary diagnosis and any associated comorbidities that effect or could slow patient's healing   Encounter Diagnoses   Name Primary?   . 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 Impaired activities of daily living    . S/P AVR (aortic valve replacement)    .  Heparin induced thrombocytopenia (CMS/HCC)    . 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 Wounds are much smaller, right first finger nail is growing in normally.     Conservative sharp debridement. Recent AVR so she will need antibiotics prior to [place] doing any extensive debridement. These three thrombus wounds caused by HITT appear to be healing well. No cellulitis. Will have her return Q 2 weeks for conservative debridement and following the wound.      Patient wound appears:  ischemic wounds caused by thrombus associated with HITT  Patient had no questions and is in agreement with this plan of care.  New concerns with Circulation/Hypoxia   no  Requiring debridement (sharp, enzymatic or autolytic employed)  yes  Signs or symptoms of Infection or is inflammation a problem   no  Is edema a concern:   no  Is moisture balance in wound a current problem   no  Is off loading, shear or repetitive trauma an issue   no   Diagnostics/ Referrals ordered    No orders of the defined types were placed in this encounter.    Advaced Therapies/ RXs ordered    New Prescriptions    No medications on file     Systemic Issues/Education  Patient education and self care measures reinforced today regarding: wound care procedure and skin care    All patient's questions were answered and plan fully discussed with patient    FOLLOW UP:  Return in about 1 week (around 04/20/2016).    This chart was produced with the EPIC system using voice recognition software, manual transcription, or both. Despite concurrent proofreading, please note that transcription errors are common and may not reflect the true intent of reporter.

## 2016-04-13 NOTE — Progress Notes (Signed)
HISTORY AND ASSESSMENT:  Brandy [name] is a 60 y.o. female here for Follow Up Care Of Wound.  Patient is accompanied by spouse.  Report no pain, no drainage feeling well, no new concerns and no problems with wound.  Dressing changed by N/A.  Oral pain medication taken by patient pre-visit? No.     Medications: No changes since last visit.  Allergies: No changes since last visit.    MD/Provider visits since last [facility] visit: None  Seeing podiatrist this afternoon.  Labs/Tests/Procedures performed since last visit: BMP, INR and BNP    Falls since last visit: No  Ambulatory Aids: None  Mobility: Independent    Primary language: English    See these sections of Medical Record for additional information related to this encounter:    PAST MEDICAL HISTORY  PAST SURGICAL HISTORY  SOCIAL HISTORY  ALLERGIES  MEDICATIONS  FAMILY HISTORY  VITAL SIGNS FLOW SHEET  POC GLUCOSE  OUTPATIENT WOUND ASSESSMENT FLOW SHEET  ENCOUNTER DIAGNOSIS  PROBLEM LIST  ORDERS    TREATMENT:  Patient assisted with undressing and redressing? Yes  Patient prepped and positioned for care? Yes  Patient required repositioning for additional care? No  1:1 nursing staff required for wound care and positioning patient.  Cleansed periwound with 3in1 foam.   Poor hygiene required need for extensive cleansing?  No    COORDINATION OF CARE:  Orders given at the time of visit to patient and family  Referrals sent to N/A.  Visit information sent to N/A.    PATIENT EDUCATON:  Instructed verbally regarding wound care procedure  Reinforced self care measures for N/A.  Patient had no questions and is in agreement with this plan of care.    The patient and family verbalize understanding of instruction.    See these sections of Medical Record for additional information related to this encounter:    PATIENT INSTRUCTIONS  FOLLOWUP  SUPPLIES SENT HOME WITH PATIENT FLOW SHEET

## 2016-04-17 ENCOUNTER — Ambulatory Visit: Admit: 2016-04-17 | Discharge: 2016-04-17 | Payer: MEDICAID | Attending: DO

## 2016-04-17 DIAGNOSIS — I359 Nonrheumatic aortic valve disorder, unspecified: Secondary | ICD-10-CM

## 2016-04-17 NOTE — Progress Notes (Signed)
Brandy [name] is a 60 y.o. female.  No chief complaint on file.      SUBJECTIVE:    60 year old female here for follow-up aortic valve replacement. Patient's postop course complicated by heparin-induced thrombocytopenia. She developed  necrosis of her fingers and toes due to HIT.  Has follow-up with wound care and cardiology. She currently is fully anticoagulated on Coumadin. She states that she is compliant with the aspirin, Lipitor, Coreg and as needed Tylenol. She denies fever, chest pains and shortness of breath. Appetite has returned to normal. States that she feels well.    The following portions of the patient's history were reviewed and updated as appropriate: allergies, current medications, past family history, past medical history, past surgical histroy and problem list.      Allergies   Allergen Reactions   . Heparin Anaphylaxis     Possible HIT   . Sulfa (Sulfonamide Antibiotics) Rash     In ICU for 3 days post dose       Patient Active Problem List   Diagnosis SNOMED CT(R)   . Heart failure (CMS/HCC) HEART FAILURE   . Elevated brain natriuretic peptide (BNP) level HORMONE LEVEL - FINDING   . NSTEMI (non-ST elevated myocardial infarction) (CMS/HCC) ACUTE NON-ST SEGMENT ELEVATION MYOCARDIAL INFARCTION   . Demand ischemia of myocardium (CMS/HCC) ACUTE ISCHEMIC HEART DISEASE   . Tachycardia TACHYCARDIA   . Pleural effusion PLEURAL EFFUSION   . Obesity OBESITY   . Acute respiratory failure with hypoxia (CMS/HCC) ACUTE RESPIRATORY FAILURE   . Hypotension LOW BLOOD PRESSURE   . Iron deficiency anemia IRON DEFICIENCY ANEMIA   . Thrombocytosis (CMS/HCC) THROMBOCYTOSIS   . Weight loss WEIGHT LOSS   . Family history of cancer FAMILY HISTORY OF CANCER   . Cardiomegaly CARDIOMEGALY   . Mediastinal lymphadenopathy MEDIASTINAL LYMPHADENOPATHY   . Hilar lymphadenopathy- bilataeral  HILAR LYMPHADENOPATHY   . Chest pain CHEST PAIN   . Dyspnea and respiratory abnormality DYSPNEA   . Abnormal EKG ELECTROCARDIOGRAM  ABNORMAL   . Opacity of lung on imaging study ABNORMAL FINDINGS ON DIAGNOSTIC IMAGING OF LUNG   . Liver lesion- small hypodense lesion around falciform lig LESION OF LIVER   . Aortic stenosis, severe AORTIC VALVE STENOSIS   . Acute systolic heart failure (CMS/HCC) ACUTE SYSTOLIC HEART FAILURE   . Non-ischemic cardiomyopathy (CMS/HCC) CARDIOMYOPATHY   . Respiratory failure (CMS/HCC) RESPIRATORY FAILURE   . S/P AVR (aortic valve replacement) HISTORY OF AORTIC VALVE REPLACEMENT   . 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 Acute renal failure with tubular necrosis (CMS/HCC) ACUTE RENAL FAILURE SYNDROME   . Aortic valve disorder AORTIC VALVE DISORDER   . Impaired gait and mobility ABNORMAL GAIT   . Impaired activities of daily living ACTIVITY OF DAILY LIVING (ADL) ALTERATION   . Peripheral edema PERIPHERAL EDEMA   . 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 Blood loss anemia ANEMIA DUE TO BLOOD LOSS   . Hypokalemia HYPOKALEMIA   . Aortic valve disorders AORTIC VALVE DISORDER   . 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 Gangrene of foot (CMS/HCC) GANGRENE OF FOOT     Current Outpatient Prescriptions   Medication Sig Dispense Refill   . acetaminophen (TYLENOL) 650 MG CR tablet Take 1 tablet by mouth every 8 hours as needed for Pain. 30 tablet 0   . aspirin 81 MG EC tablet Take 1 tablet by mouth daily.     Marland Kitchen  atorvastatin (LIPITOR) 20 MG tablet Take 1 tablet by mouth every night at bedtime. 30 tablet 1   . carvedilol (COREG) 6.25 MG tablet Take 1 tablet by mouth 2 times daily with meals. 60 tablet 1   . furosemide (LASIX) 20 MG tablet Take 1 tablet by mouth daily. Please fill ASAP and call when filled for patient to pick up today. 30 tablet 2   . HYDROcodone-acetaminophen (NORCO) 5-325 mg per tablet Take 1 tablet by mouth every 6 hours as needed for Pain. 30 tablet 0   . potassium chloride SA (K-DUR,KLOR-CON) 20 MEQ tablet Take 10 mEq by  mouth daily.     Marland Kitchen warfarin (COUMADIN) 6 MG tablet Take 1 tablet by mouth daily. Or as directed by [facility] (Patient taking differently: Take 6 mg by mouth daily. Or as directed by [facility] & Fri take 9mg ) 90 tablet 1     No current facility-administered medications for this visit.      Social History     Social History Main Topics   . Smoking status: Passive Smoke Exposure - Never Smoker   . Smokeless tobacco: Never Used   . Alcohol use No   . Drug use: No   . Sexual activity: Not Currently     Partners: Male     Social History   . Marital status: Married     Spouse name: N/A   . Number of children: N/A   . Years of education: N/A     Other Topics Concern   . Not on file     Past Surgical History:   Procedure Laterality Date   . AORTIC VALVE REPLACEMENT     . PROCEDURE N/A 01/27/2016    Procedure: AORTIC VALVE REPLACEMENT; INTRA AORTIC BALLOON INSERTION;  Surgeon: Otho Bellows, MD;  Location: [facility] OR;  Service: Cardiology;  Laterality: N/A;   . PROCEDURE Right 03/10/2016    Procedure: AMPUTATION TOE;  Surgeon: Chrystie Nose, DPM;  Location: [facility] OR;  Service: Podiatry;  Laterality: Right;   . TONSILLECTOMY     . TUBAL LIGATION  1980     Family History   Problem Relation Age of Onset   . Cancer Mother    . Heart disease Father    . Heart disease Sister    . Cancer Brother    . Cancer Maternal Aunt    . Heart disease Maternal Aunt    . Cancer Maternal Uncle    . Heart disease Maternal Uncle          Review of Systems   Constitutional: Negative for fever, malaise/fatigue and weight loss.   HENT: Negative for congestion, ear pain and sore throat.    Eyes: Negative for blurred vision.   Respiratory: Negative for cough and shortness of breath.    Cardiovascular: Negative for chest pain, palpitations and leg swelling.   Gastrointestinal: Negative for abdominal pain.   Genitourinary: Negative for dysuria.   Musculoskeletal: Negative for back pain, falls and myalgias.   Skin: Negative for rash.   Neurological: Negative  for dizziness, tingling, tremors, focal weakness and headaches.       OBJECTIVE:  BP 102/72  Pulse (!) 143  Ht 5' 6 (1.676 m)  Wt 202 lb (91.6 kg)  SpO2 98%  BMI 32.6 kg/m2  Body mass index is 32.6 kg/(m^2).  Physical Exam  Gen: no acute distress, well nourished  Head normocephalic. PERRLA. Posterior pharynx clear.EOM's intact. Tympanic membranes clear.  Neck: supple without nodes,  JVD, thyromegaly  Resp: good air movement, no retractions, symetrical breath sounds, without rales or wheezing  CV: RRR, normal S1, S2; no murmur or gallop  Abdomen is soft.Bowel sounds are normoactive.No abdominal tenderness noted.  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 Aortic valve disorders 424.1 I35.9    2. 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 Instructed to continue aspirin 81 mg daily, Coumadin 6 mg alternating with 9 mg as per Coumadin clinic, Lipitor 20 mg daily, Coreg 6.25 mg twice daily. May stop Lasix and potassium. Keep follow-up with cardiology in December.?  Continue follow-up with wound care.  This note was transcribed using speech recognition software. Please contact us for clarification if any questions arise relating to the wording of this document.

## 2016-04-25 ENCOUNTER — Ambulatory Visit: Admit: 2016-04-25 | Discharge: 2016-04-25

## 2016-04-25 DIAGNOSIS — Z5181 Encounter for therapeutic drug level monitoring: Secondary | ICD-10-CM

## 2016-04-25 LAB — POCT INR
POC INR: 2.2 — ABNORMAL HIGH (ref 0.8–1.2)
POC PT: 26.5 s — ABNORMAL HIGH (ref 9.6–17)

## 2016-04-25 LAB — PROTIME-INR: INR: 2.2 — AB (ref <=1.1)

## 2016-04-25 MED ORDER — carvedilol (COREG) 6.25 MG tablet
6.25 | ORAL_TABLET | Freq: Two times a day (BID) | ORAL | 3 refills | 90.00000 days | Status: DC
Start: 2016-04-25 — End: 2016-10-27

## 2016-04-25 MED ORDER — atorvastatin (LIPITOR) 20 MG tablet
20 | ORAL_TABLET | Freq: Every evening | ORAL | 3 refills | Status: DC
Start: 2016-04-25 — End: 2016-10-27

## 2016-04-25 NOTE — Telephone Encounter (Signed)
Medication Being Requested:  atorvastatin (LIPITOR) 20 MG tablet, carvedilol (COREG) 6.25 MG tablet    Send To Pharmacy     [facility] [address] [place], [place] - [address]  [address] [place] [address]  Phone: [phone] Fax: [phone]      How many Days Remaining of medication: 2 days    Additional Information:     Requesting refills on these medications. Please call back at  [phone] (home)     **Informed patient that it may take up to 48-72 hours to fill prescription.**    Future Appointments:  Future Appointments  Date Time Provider Department Center   04/25/2016 2:15 PM [facility] ANTICOAG/MTM ROOM 2 [facility]   04/27/2016 1:30 PM Bubba Hales, FNP RR WOUND [facility]   05/11/2016 2:10 PM Bubba Hales, FNP RR WOUND [facility]   05/31/2016 1:30 PM Otho Bellows, MD AP CVTS [facility]   05/31/2016 2:40 PM [name], DO APPFM1 [facility]

## 2016-04-25 NOTE — Progress Notes (Signed)
INR RANGE: (2-3)    Therapeutic POC INR 2.2 during titration.   Continue warfarin 54mg  weekly (9mg  daily except 6mg  every Mon/Wed/Fri).   Check INR and RTC in 3 weeks on 12/5@2 .    [facility] Coumadin Flowsheet POC INR Hematocrit Hemoglobin   Latest Ref Rng & Units 0.8 - 1.2 35.0 - 48.0 % 11.7 - 16.5 g/dL   29/52/8413 1.4 (H)     03/28/2016 2.9 (H)     03/17/2016 1.9 (H)     03/16/2016      03/13/2016 1.3 (H)     03/12/2016      03/11/2016      03/09/2016      03/06/2016 1.9 (H) 37.5 12.1   03/02/2016 1.7 (H)     02/28/2016 1.5 (H)

## 2016-04-25 NOTE — Telephone Encounter (Signed)
LV 04/17/16, NV 05/31/16.

## 2016-04-27 ENCOUNTER — Institutional Professional Consult (permissible substitution): Admit: 2016-04-27 | Discharge: 2016-04-27 | Payer: MEDICAID | Attending: Family

## 2016-04-27 DIAGNOSIS — L97513 Non-pressure chronic ulcer of other part of right foot with necrosis of muscle: Secondary | ICD-10-CM

## 2016-04-27 NOTE — Progress Notes (Signed)
PRIMARY CARE PROVIDER:  [name], DO    REFERRING PROVIDER: [name], DO    REASON FOR REFERRAL: Follow Up Care Of Mulitiple Ulcers    HISTORY OF PRESENT ILLNESS:  Date of Presentation to wound center: 04/27/2016  Date of Occurrence: 01/28/16  Wound Location: right second and fourth toes, right pointer finger  Wound Cause:  HITT  Admissions or Prior Evaluations: Dr Margot Ables, took off the right third toe. While in hospital. [facility] is instructed to follow up with him for the right third toe.  Topical Treatments & ABX: betadine  Related LABS/CULTURES of wound: denies  Related IMAGING:   denies  Off Loading: NA  Compression: NA  Prior Lower Extrem Vascular Surgery denies  LAST Tetanus Vaccine:  Close to 10 years. She is instructed to discuss with Dr. Sudie Bailey  DME Ordered: none today  Social/HH: no home health. Lives outside of [place], [place] 20 miles lives with her husband, 2 llamas, dogs, chickens    LABS:    Recent Results (from the past 168 hour(s))   Protime Panel -Routine    Collection Time: 04/25/16 12:00 AM   Result Value Ref Range    INR 2.2 (External Abn) 0.9 - 1.1   POCT INR    Collection Time: 04/25/16  2:08 PM   Result Value Ref Range    POC INR 2.2 (H) 0.8 - 1.2      POC PT 26.5 (H) 9.6 - 17 SEC       SUBJECTIVE:  Patient reports decreased pain, no odor, no drainage feeling well, no new concerns and no problems with wound.   Dressing changes performed by patient since last visit: painting with betadine daily - cleaned it off prior to visit here so we can evaluate. Less pain over the past two weeks, edema is reducing also.   Patient is accompanied by patient and spouse.   Patient's impression is that the wound is: improved.    History of Extremity arterial disease, extremity bypass or extremity stent: no  History of Heart Valve replacement or disease requiring endocarditis prophalaxis: yes      Patient is protecting wound with water proof dressing in shower (instructions importance of keeping  wound protected given)    Previous chronic wounds: denies  denies travel out of the country in temporal relation to wound  Ambulatory Status: no assistive devices    ADV DIRECTIVE:  FULL CODE    Past Medical History:   Diagnosis Date   . History of blood transfusion    . 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 AORTIC VALVE REPLACEMENT     . PROCEDURE N/A 01/27/2016    Procedure: AORTIC VALVE REPLACEMENT; INTRA AORTIC BALLOON INSERTION;  Surgeon: Otho Bellows, MD;  Location: [facility] OR;  Service: Cardiology;  Laterality: N/A;   . PROCEDURE Right 03/10/2016    Procedure: AMPUTATION TOE;  Surgeon: Chrystie Nose, DPM;  Location: [facility] OR;  Service: Podiatry;  Laterality: Right;   . TONSILLECTOMY     . TUBAL LIGATION  1980       Current Outpatient Prescriptions   Medication Sig Dispense Refill   . acetaminophen (TYLENOL) 650 MG CR tablet Take 1 tablet by mouth every 8 hours as needed for Pain. 30 tablet 0   . aspirin 81 MG EC tablet Take 1 tablet by mouth daily.     Marland Kitchen atorvastatin (LIPITOR) 20 MG tablet Take  1 tablet by mouth every night at bedtime. 90 tablet 3   . carvedilol (COREG) 6.25 MG tablet Take 1 tablet by mouth 2 times daily with meals. 180 tablet 3   . HYDROcodone-acetaminophen (NORCO) 5-325 mg per tablet Take 1 tablet by mouth every 6 hours as needed for Pain. 30 tablet 0   . warfarin (COUMADIN) 6 MG tablet Take 1 tablet by mouth daily. Or as directed by [facility] (Patient taking differently: Take 6 mg by mouth daily. Or as directed by [facility] & Fri take 9mg ) 90 tablet 1     No current facility-administered medications for this visit.        Allergies   Allergen Reactions   . Heparin Anaphylaxis     Possible HIT   . Sulfa (Sulfonamide Antibiotics) Rash     In ICU for 3 days post dose         Family History   Problem Relation Age of Onset   . Cancer Mother    . Heart disease Father    . Heart disease Sister    . Cancer Brother    . Cancer  Maternal Aunt    . Heart disease Maternal Aunt    . Cancer Maternal Uncle    . Heart disease Maternal Uncle        Social History     Social History   . Marital status: Married     Spouse name: N/A   . Number of children: N/A   . Years of education: N/A     Occupational History   . Not on file.     Social History Main Topics   . Smoking status: Passive Smoke Exposure - Never Smoker   . Smokeless tobacco: Never Used   . Alcohol use No   . Drug use: No   . Sexual activity: Not Currently     Partners: Male     Other Topics Concern   . Not on file     Social History Narrative   . No narrative on file       Review of Systems   General/Constitutional: Negative for fever and chills.   Gastrointestinal: Negative for nausea and vomiting.       PHYSICAL EXAM:   VITAL SIGNS:                                                GENERAL:  Patient is awake alert acting appropriately and in no distress with clear well articulated speech.  PULMONARY:    No increased work of breathing.  ABDOMEN:  Soft, nontender, without guarding.   NEURO:  MAE, Nonfocal    LOWER EXTREMITIES:      (Y=Present  N=Negative or not present  Empty field=not assessed)  (Edema Scale:0=None  1=Mild   2=Moderate  3=Severe)        DERMAL APPENDAGES:   Nails: are not Thickened discolored:  There is Hair growth below knee: Bilaterally    CIRCULATION-VENOUS-LYMPH:   Right Left Bilateral   DP Pulse triphasic     PT Pulse biphasic     Digit Refill (<sec) 2     Edema (0-3+) no     Hemosid no     Varicosities no     Palor/Cyanosis no     Distal Temp  warm  SENSATION:    Light touch: is  normal  Bilaterally        MOTOR:    Foot Deformities: none noted. 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  Age of wound at [facility]: 8.71 number of weeks.  Treatment Week #: 4.14. Measures 1.2 cm X 0.7 cm X 0 cm.                                                                                     Wound Area: 0.84 cm2           Margins: Attached   Tissue Type: subcutaneous   Tissue Quality: necrotic, dry                   Drainage Amount: None       Wound is:: improved   Peri-wound Assessment: clear   Peri-wound is:: stable   Pain since last visit: 2    Wound Number: 2 Right  Fourth Toe.  Age of wound at [facility]: 8.71 number of weeks.  Treatment Week #: 4.14.  Measures 0.8 cm X 0.6 cm X 0 cm.                                                                                    Wound Area: 0.48 cm2           Margins: Attached   Tissue Type: subcutaneous   Tissue Quality: necrotic, dry                   Drainage Amount: None       Wound is:: improved   Peri-wound Assessment: clear   Peri-wound is:: stable   Pain since last visit: 0    Wound Number: 3 Right  Second Finger.  Age of wound at [facility]: 8.71 number of weeks.  Treatment Week #: 4.14.  Measures 1 cm X 1 cm X 0 cm.                                                                                    Wound Area: 1 cm2           Margins: Attached   Tissue Type: subcutaneous   Tissue Quality: necrotic, dry                   Drainage Amount: None  Wound is:: improved   Peri-wound Assessment: mild erythema   Peri-wound is:: improved   Pain since last visit: 3          DEBRIDEMENT PROCEDURE NOTE    PARQ Risks discussed with patient including bleeding, pain infection and damage to deeper tissues.  All questions were answered and patient wanted to proceed    Based on my assessment the patient required sharp debridement today.  Indications for debridement  _x__ Nonviable tissue/wound debris--to leave healthier wound base  ___ Undermined margin-to leave more attached  wound margins  ___ Rolled margin-to leave more attached and/or beveled margins     ___ To bevel wound margins  ___ Callus--To leave more pliable skin   _x__ To trim back Loose edge of Fibrotic Cap or Eschar  ___ Trim Hyperkeratotic Epithelium from wound edge  ___ Removed Rolled Edge  ___ To obtain tissue  culture    Wound Number: 1  Second Toe  Right              Lidocaine applied: no   Wound did not require topical anesthesia.   Excisional Debridement By Provider: epidermal/dermal eschar   Percent of wound debrided: 100 %   Tissue debrided using: Tissue nippers   Post debridement cleansed with:: Betadine  Gauze                                       First Layer: Betadine                                                   Pain after wound care: controlled   Dressing changes: daily       Wound Number: 2  Fourth Toe  Right              Lidocaine applied: no   Wound did not require topical anesthesia.   Excisional Debridement By Provider: epidermal/dermal eschar   Percent of wound debrided: 100 %   Tissue debrided using: Tissue nippers   Post debridement cleansed with:: Betadine  Gauze                                       First Layer: Betadine                                                   Pain after wound care: controlled   Dressing changes: daily      Wound Number: 3  Second Finger  Right              Lidocaine applied: no   Wound did not require topical anesthesia.   Excisional Debridement By Provider: epidermal/dermal eschar   Percent of wound debrided: 100 %   Tissue debrided using: Tissue nippers   Post debridement cleansed with:: Betadine  Gauze  First Layer: Betadine                                                   Pain after wound care: controlled   Dressing changes: daily      Patient tolerated the procedure well with less than 0 ML blood loss.      Debridement was successful and post Debridement the site showed a healthier, more viable base.     TREATMENT:    Wound Number: 1  Second Toe  Right   Wound treatment orders: treatment ordered as performed below.         and     Lidocaine applied: no               Wound will be redressed with                          First Layer: Betadine                                                    Wound Number: 2  Fourth Toe  Right    Wound treatment orders: treatment ordered as performed below.         and     Lidocaine applied: no               Wound will be redressed with                          First Layer: Betadine                                                    Wound Number: 3  Second Finger  Right   Wound treatment orders: treatment ordered as performed below.         and     Lidocaine applied: no               Wound will be redressed with                          First Layer: Betadine                                                          DIAGNOSIS:  The following is the patient's primary diagnosis and any associated comorbidities that effect or could slow patient's healing   Encounter Diagnoses   Name Primary?   . 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 Impaired activities of daily living    . S/P AVR (aortic valve replacement)    . Heparin induced thrombocytopenia (CMS/HCC)    . 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 Wounds continue to improve. No issues or concerns. Continue with current plan.     Conservative sharp debridement. Recent AVR so she will need antibiotics prior to [place] doing any extensive debridement. These three thrombus wounds caused by HITT appear to be healing well. No cellulitis. Will have her return Q 2 weeks for conservative debridement and following the wound.      Patient wound appears:  ischemic wounds caused by thrombus associated with HITT  Patient had no questions and is in agreement with this plan of care.  New concerns with Circulation/Hypoxia   no  Requiring debridement (sharp, enzymatic or autolytic employed)  yes  Signs or symptoms of Infection or is inflammation a problem   no  Is edema a concern:   no  Is moisture balance in wound a current problem   no  Is off loading, shear or repetitive trauma an issue   no   Diagnostics/  Referrals ordered    No orders of the defined types were placed in this encounter.    Advaced Therapies/ RXs ordered    New Prescriptions    No medications on file     Systemic Issues/Education  Patient education and self care measures reinforced today regarding: wound care procedure and skin care    All patient's questions were answered and plan fully discussed with patient    FOLLOW UP:  No Follow-up on file.    This chart was produced with the EPIC system using voice recognition software, manual transcription, or both. Despite concurrent proofreading, please note that transcription errors are common and may not reflect the true intent of reporter.

## 2016-04-27 NOTE — Progress Notes (Signed)
HISTORY AND ASSESSMENT:  Brandy [name] is a 60 y.o. female here for Follow Up Care Of Mulitiple Ulcers.  Patient is accompanied by spouse.  Report increased pain, no drainage feeling well, no new concerns and no problems with wound.  Betadine applied daily by patient.  Oral pain medication taken by patient pre-visit? No.     Medications: No changes since last visit.  Allergies: No changes since last visit.    MD/Provider visits since last [facility] visit: Podiatrist 04/13/16 removed stitches  Labs/Tests/Procedures performed since last visit: INR   Falls since last visit: No  Ambulatory Aids: None  Mobility: Independent    Primary language: English    See these sections of Medical Record for additional information related to this encounter:    PAST MEDICAL HISTORY  PAST SURGICAL HISTORY  SOCIAL HISTORY  ALLERGIES  MEDICATIONS  FAMILY HISTORY  VITAL SIGNS FLOW SHEET  POC GLUCOSE  OUTPATIENT WOUND ASSESSMENT FLOW SHEET  ENCOUNTER DIAGNOSIS  PROBLEM LIST  ORDERS    TREATMENT:  Patient assisted with undressing and redressing? Yes  Patient prepped and positioned for care? Yes  Patient required repositioning for additional care? No  1:1 nursing staff required for wound care and positioning patient.  Cleansed periwound with 3in1 foam.   Poor hygiene required need for extensive cleansing?  No    COORDINATION OF CARE:  Orders given at the time of visit to patient and family  Referrals sent to N/A.  Visit information sent to N/A.    PATIENT EDUCATON:  Instructed verbally regarding wound care procedure  Reinforced self care measures for N/A.  Patient had no questions and is in agreement with this plan of care.    The patient and family verbalize understanding of instruction.    See these sections of Medical Record for additional information related to this encounter:    PATIENT INSTRUCTIONS  FOLLOWUP  SUPPLIES SENT HOME WITH PATIENT FLOW SHEET

## 2016-05-10 ENCOUNTER — Institutional Professional Consult (permissible substitution): Admit: 2016-05-10 | Discharge: 2016-05-10 | Payer: MEDICAID | Attending: Family

## 2016-05-10 DIAGNOSIS — L97521 Non-pressure chronic ulcer of other part of left foot limited to breakdown of skin: Secondary | ICD-10-CM

## 2016-05-10 NOTE — Progress Notes (Signed)
PRIMARY CARE PROVIDER:  [name], DO    REFERRING PROVIDER: [name], DO    REASON FOR REFERRAL: Follow Up Care Of Ulcer    HISTORY OF PRESENT ILLNESS:  Date of Presentation to wound center: 05/10/2016  Date of Occurrence: 01/28/16  Wound Location: right second and fourth toes, right pointer finger  Wound Cause:  HITT  Admissions or Prior Evaluations: Dr Margot Ables, took off the right third toe. While in hospital. [employer] is instructed to follow up with him for the right third toe.  Topical Treatments & ABX: betadine  Related LABS/CULTURES of wound: denies  Related IMAGING:   denies  Off Loading: NA  Compression: NA  Prior Lower Extrem Vascular Surgery denies  LAST Tetanus Vaccine:  Close to 10 years. She is instructed to discuss with Dr. Sudie Bailey  DME Ordered: none today  Social/HH: no home health. Lives outside of [place], about 20 miles lives with her husband, 2 llamas, dogs, chickens    LABS:    No results found for this or any previous visit (from the past 168 hour(s)).    SUBJECTIVE:  Patient reports decreased pain, no odor, no drainage feeling well, no new concerns and no problems with wound.   Dressing changes performed by patient since last visit: painting with betadine daily - cleaned it off prior to visit here so we can evaluate. Trying to do exercises of the fingers to reduce swelling and increase usage. Still limited in use of the finger and right hand.  Patient is accompanied by patient and spouse.   Patient's impression is that the wound is: improved.    History of Extremity arterial disease, extremity bypass or extremity stent: no  History of Heart Valve replacement or disease requiring endocarditis prophalaxis: yes      Patient is protecting wound with water proof dressing in shower (instructions importance of keeping wound protected given)    Previous chronic wounds: denies  denies travel out of the country in temporal relation to wound  Ambulatory Status: no assistive devices    ADV  DIRECTIVE:  FULL CODE    Past Medical History:   Diagnosis Date   . History of blood transfusion    . 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 AORTIC VALVE REPLACEMENT     . PROCEDURE N/A 01/27/2016    Procedure: AORTIC VALVE REPLACEMENT; INTRA AORTIC BALLOON INSERTION;  Surgeon: Otho Bellows, MD;  Location: [facility] OR;  Service: Cardiology;  Laterality: N/A;   . PROCEDURE Right 03/10/2016    Procedure: AMPUTATION TOE;  Surgeon: Chrystie Nose, DPM;  Location: [facility] OR;  Service: Podiatry;  Laterality: Right;   . TONSILLECTOMY     . TUBAL LIGATION  1980       Current Outpatient Prescriptions   Medication Sig Dispense Refill   . acetaminophen (TYLENOL) 650 MG CR tablet Take 1 tablet by mouth every 8 hours as needed for Pain. 30 tablet 0   . aspirin 81 MG EC tablet Take 1 tablet by mouth daily.     Marland Kitchen atorvastatin (LIPITOR) 20 MG tablet Take 1 tablet by mouth every night at bedtime. 90 tablet 3   . carvedilol (COREG) 6.25 MG tablet Take 1 tablet by mouth 2 times daily with meals. 180 tablet 3   . HYDROcodone-acetaminophen (NORCO) 5-325 mg per tablet Take 1 tablet by mouth every 6 hours as needed for Pain. 30 tablet 0   .  warfarin (COUMADIN) 6 MG tablet Take 1 tablet by mouth daily. Or as directed by [facility] (Patient taking differently: Take 6 mg by mouth daily. Or as directed by Marijean Heath & Fri take 9mg ) 90 tablet 1     No current facility-administered medications for this visit.        Allergies   Allergen Reactions   . Heparin Anaphylaxis     Possible HIT   . Sulfa (Sulfonamide Antibiotics) Rash     In ICU for 3 days post dose         Family History   Problem Relation Age of Onset   . Cancer Mother    . Heart disease Father    . Heart disease Sister    . Cancer Brother    . Cancer Maternal Aunt    . Heart disease Maternal Aunt    . Cancer Maternal Uncle    . Heart disease Maternal Uncle        Social History     Social History   . Marital status:  Married     Spouse name: N/A   . Number of children: N/A   . Years of education: N/A     Occupational History   . Not on file.     Social History Main Topics   . Smoking status: Passive Smoke Exposure - Never Smoker   . Smokeless tobacco: Never Used   . Alcohol use No   . Drug use: No   . Sexual activity: Not Currently     Partners: Male     Other Topics Concern   . Not on file     Social History Narrative   . No narrative on file       Review of Systems   General/Constitutional: Negative for fever and chills.   Gastrointestinal: Negative for nausea and vomiting.       PHYSICAL EXAM:   VITAL SIGNS:                                                GENERAL:  Patient is awake alert acting appropriately and in no distress with clear well articulated speech.  PULMONARY:    No increased work of breathing.  ABDOMEN:  Soft, nontender, without guarding.   NEURO:  MAE, Nonfocal    LOWER EXTREMITIES:      (Y=Present  N=Negative or not present  Empty field=not assessed)  (Edema Scale:0=None  1=Mild   2=Moderate  3=Severe)        DERMAL APPENDAGES:   Nails: are not Thickened discolored:  There is Hair growth below knee: Bilaterally    CIRCULATION-VENOUS-LYMPH:   Right Left Bilateral   DP Pulse triphasic     PT Pulse biphasic     Digit Refill (<sec) 2     Edema (0-3+) no     Hemosid no     Varicosities no     Palor/Cyanosis no     Distal Temp  warm       SENSATION:    Light touch: is  normal  Bilaterally        MOTOR:    Foot Deformities: none noted. 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  Age of wound at [facility]: 8.71 number of weeks.  Treatment Week #: 6. Measures   X   X  .                                                                                                Margins: Hyperkeratotic   Tissue Type:  (callous)   Tissue Quality: dry                   Drainage Amount: None       Wound is:: improved   Peri-wound Assessment: callous    Peri-wound is:: improved        Wound Number: 2 Right  Fourth Toe.  Age of wound at [facility]: 8.71 number of weeks.  Treatment Week #: 6.  Measures   X   X  .                                                                                                    Tissue Type: intact epithelium                       Drainage Amount: None       Wound is:: improved   Peri-wound Assessment: clear   Peri-wound is:: improved        Wound Number: 3 Right  Second Finger.  Age of wound at [facility]: 8.71 number of weeks.  Treatment Week #: 6.  Measures 0.8 cm X 0.5 cm X  .                                                                                    Wound Area: 0.4 cm2               Tissue Type: eschar   Tissue Quality: dry                   Drainage Amount: None       Wound is:: improved   Peri-wound Assessment: swollen, mild erythema   Peri-wound is:: improved              DEBRIDEMENT PROCEDURE NOTE    PARQ Risks discussed with patient including bleeding, pain infection and damage to deeper tissues.  All questions were answered and patient wanted to proceed    Based on my assessment the patient  required sharp debridement today.  Indications for debridement  _x__ Nonviable tissue/wound debris--to leave healthier wound base  ___ Undermined margin-to leave more attached  wound margins  ___ Rolled margin-to leave more attached and/or beveled margins     ___ To bevel wound margins  ___ Callus--To leave more pliable skin   _x__ To trim back Loose edge of Fibrotic Cap or Eschar  ___ Trim Hyperkeratotic Epithelium from wound edge  ___ Removed Rolled Edge  ___ To obtain tissue culture    Wound Number: 1  Second Toe  Right              Lidocaine applied: no   Wound did not require topical anesthesia.   Excisional Debridement By Provider: epidermal/dermal eschar   Percent of wound debrided: 100 %   Tissue debrided using: Tissue nippers                                              First Layer: Betadine                                                    Pain after wound care: controlled   Dressing changes: daily       Wound Number: 2  Fourth Toe  Right              Lidocaine applied: no   Wound did not require topical anesthesia.   Excisional Debridement By Provider: epidermal/dermal eschar   Percent of wound debrided: 100 %   Tissue debrided using: Tissue nippers                                                                                                  Pain after wound care: controlled          Wound Number: 3  Second Finger  Right              Lidocaine applied: no   Wound did not require topical anesthesia.   Excisional Debridement By Provider: epidermal/dermal eschar   Percent of wound debrided: 100 %   Tissue debrided using: Tissue nippers   Post debridement cleansed with:: Betadine                                          First Layer: Betadine                                                   Pain after wound care: controlled   Dressing changes: daily      Patient tolerated the procedure well  with less than 0 ML blood loss.      Debridement was successful and post Debridement the site showed a healthier, more viable base.     TREATMENT:    Wound Number: 1  Second Toe  Right   Wound treatment orders: treatment ordered as performed below.         and     Lidocaine applied: no               Wound will be redressed with                          First Layer: Betadine                                                    Wound Number: 2  Fourth Toe  Right   Wound treatment orders: treatment ordered as performed below.         and     Lidocaine applied: no               Wound will be redressed with                                                                               Wound Number: 3  Second Finger  Right   Wound treatment orders: treatment ordered as performed below.         and     Lidocaine applied: no               Wound will be redressed with                          First Layer: Betadine                                                                     DIAGNOSIS:  The following is the patient's primary diagnosis and any associated comorbidities that effect or could slow patient's healing   Encounter Diagnoses   Name Primary?   . 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 Impaired activities of daily living    . S/P AVR (aortic valve replacement)    . Heparin induced thrombocytopenia (CMS/HCC)    . 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 Dry gangrene (CMS/HCC)        DISCUSSION/ PLAN/ INSTRUCTIONS:     Debridement required again today. Right second toe is almost healed. Right fourth toe is healed. Right first finger is improved.  Will see her again in 2 weeks and then back after the holiday. 2-4 weeks ok. Recommend that she  continued to her hand and finger exercises several times each day. Elevate her hand as tolerated. She can apply a little sock or mittens on her fingers in order to reduce swelling on the first finger.    Conservative sharp debridement. Recent AVR so she will need antibiotics prior to [place] doing any extensive debridement. These three thrombus wounds caused by HITT appear to be healing well. No cellulitis. Will have her return Q 2 weeks for conservative debridement and following the wound.      Patient wound appears:  ischemic wounds caused by thrombus associated with HITT  Patient had no questions and is in agreement with this plan of care.  New concerns with Circulation/Hypoxia   no  Requiring debridement (sharp, enzymatic or autolytic employed)  yes  Signs or symptoms of Infection or is inflammation a problem   no  Is edema a concern:   no  Is moisture balance in wound a current problem   no  Is off loading, shear or repetitive trauma an issue   no   Diagnostics/ Referrals ordered    No orders of the defined types were placed in this encounter.    Advaced Therapies/ RXs ordered    New Prescriptions    No medications on file     Systemic  Issues/Education  Patient education and self care measures reinforced today regarding: wound care procedure and skin care    All patient's questions were answered and plan fully discussed with patient    FOLLOW UP:  No Follow-up on file.    This chart was produced with the EPIC system using voice recognition software, manual transcription, or both. Despite concurrent proofreading, please note that transcription errors are common and may not reflect the true intent of reporter.

## 2016-05-10 NOTE — Progress Notes (Signed)
HISTORY AND ASSESSMENT:  Brandy [name] is a 60 y.o. female here for Follow Up Care Of Ulcer.  Patient is accompanied by spouse.  Report decreased pain, no drainage feeling well, no new concerns and no problems with wound.  Betadine applied daily by patient.  Oral pain medication taken by patient pre-visit? No.     Medications: No changes since last visit.  Allergies: No changes since last visit.    MD/Provider visits since last [facility] visit: None     Labs/Tests/Procedures performed since last visit: None   Falls since last visit: No  Ambulatory Aids: None  Mobility: Independent    Primary language: English    See these sections of Medical Record for additional information related to this encounter:    PAST MEDICAL HISTORY  PAST SURGICAL HISTORY  SOCIAL HISTORY  ALLERGIES  MEDICATIONS  FAMILY HISTORY  VITAL SIGNS FLOW SHEET  POC GLUCOSE  OUTPATIENT WOUND ASSESSMENT FLOW SHEET  ENCOUNTER DIAGNOSIS  PROBLEM LIST  ORDERS    TREATMENT:  Patient assisted with undressing and redressing? Yes  Patient prepped and positioned for care? Yes  Patient required repositioning for additional care? No  1:1 nursing staff required for wound care and positioning patient.  Cleansed periwound with 3in1 foam.   Poor hygiene required need for extensive cleansing?  No    COORDINATION OF CARE:  Orders given at the time of visit to patient and family  Referrals sent to N/A.  Visit information sent to N/A.    PATIENT EDUCATON:  Instructed verbally regarding wound care procedure  Reinforced self care measures for N/A.  Patient had no questions and is in agreement with this plan of care.    The patient and family verbalize understanding of instruction.    See these sections of Medical Record for additional information related to this encounter:    PATIENT INSTRUCTIONS  FOLLOWUP  SUPPLIES SENT HOME WITH PATIENT FLOW SHEET

## 2016-05-11 ENCOUNTER — Ambulatory Visit: Payer: MEDICAID | Attending: Family

## 2016-05-16 ENCOUNTER — Ambulatory Visit: Admit: 2016-05-16 | Discharge: 2016-05-16

## 2016-05-16 DIAGNOSIS — Z5181 Encounter for therapeutic drug level monitoring: Secondary | ICD-10-CM

## 2016-05-16 LAB — PROTIME-INR: INR: 2.1 — AB (ref <=1.1)

## 2016-05-16 LAB — POCT INR
POC INR: 2.1 — ABNORMAL HIGH (ref 0.8–1.2)
POC PT: 24.6 s — ABNORMAL HIGH (ref 9.6–17)

## 2016-05-16 NOTE — Progress Notes (Signed)
INR RANGE: (2-3)    Therapeutic POC INR 2.1.   Continue warfarin 54mg  weekly (9mg  daily except 6mg  Mon/Wed/Fri).   Check INR and RTC in 1 month on 1/2@12 :45.    [facility] Coumadin Flowsheet INR   Latest Ref Rng & Units 0.9 - 1.1   04/25/2016 2.2 (External Abn)   04/11/2016 1.4 (External Abn)   03/28/2016 2.9 (External Abn)   03/17/2016 1.9 (External Abn)   03/16/2016 1.9 (A)   03/13/2016 1.3 (External Abn)   03/12/2016 1.2   03/11/2016 1.2   03/09/2016 2.0 (H)   03/06/2016 1.9 (External Abn)   03/02/2016 1.7 (External Abn)   02/28/2016 1.5 (External Abn)

## 2016-05-25 ENCOUNTER — Institutional Professional Consult (permissible substitution): Admit: 2016-05-25 | Discharge: 2016-05-25 | Payer: MEDICAID | Attending: Family

## 2016-05-25 DIAGNOSIS — L97521 Non-pressure chronic ulcer of other part of left foot limited to breakdown of skin: Secondary | ICD-10-CM

## 2016-05-25 NOTE — Progress Notes (Signed)
HISTORY AND ASSESSMENT:  Brandy [name] is a 60 y.o. female here for Follow Up Care Of Mulitiple Ulcers.  Patient is accompanied by spouse.  Report pain unchanged, decreased pain, no drainage feeling well, no new concerns and no problems with wound.  Betadine applied daily by patient.  Oral pain medication taken by patient pre-visit? No.     Medications: No changes since last visit.  Allergies: No changes since last visit.    MD/Provider visits since last [facility] visit: None     Labs/Tests/Procedures performed since last visit: None   Falls since last visit: No  Ambulatory Aids: None  Mobility: Independent    Primary language: English    See these sections of Medical Record for additional information related to this encounter:    PAST MEDICAL HISTORY  PAST SURGICAL HISTORY  SOCIAL HISTORY  ALLERGIES  MEDICATIONS  FAMILY HISTORY  VITAL SIGNS FLOW SHEET  POC GLUCOSE  OUTPATIENT WOUND ASSESSMENT FLOW SHEET  ENCOUNTER DIAGNOSIS  PROBLEM LIST  ORDERS    TREATMENT:  Patient assisted with undressing and redressing? Yes  Patient prepped and positioned for care? Yes  Patient required repositioning for additional care? No  1:1 nursing staff required for wound care and positioning patient.  Cleansed periwound with 3in1 foam.   Poor hygiene required need for extensive cleansing?  No    COORDINATION OF CARE:  Orders given at the time of visit to patient and family  Referrals sent to N/A.  Visit information sent to N/A.    PATIENT EDUCATON:  Instructed verbally regarding wound care procedure  Reinforced self care measures for N/A.  Patient had no questions and is in agreement with this plan of care.    The patient and family verbalize understanding of instruction.    See these sections of Medical Record for additional information related to this encounter:    PATIENT INSTRUCTIONS  FOLLOWUP  SUPPLIES SENT HOME WITH PATIENT FLOW SHEET

## 2016-05-25 NOTE — Progress Notes (Signed)
PRIMARY CARE PROVIDER:  [name], DO    REFERRING PROVIDER: [name], DO    REASON FOR REFERRAL: Follow Up Care Of Mulitiple Ulcers    HISTORY OF PRESENT ILLNESS:  Date of Presentation to wound center: 05/25/2016  Date of Occurrence: 01/28/16  Wound Location: right second and fourth toes, right pointer finger  Wound Cause:  HITT  Admissions or Prior Evaluations: Dr Margot Ables, took off the right third toe. While in hospital. [facility] is instructed to follow up with him for the right third toe.  Topical Treatments & ABX: betadine  Related LABS/CULTURES of wound: denies  Related IMAGING:   denies  Off Loading: NA  Compression: NA  Prior Lower Extrem Vascular Surgery denies  LAST Tetanus Vaccine:  Close to 10 years. She is instructed to discuss with Dr. Sudie Bailey  DME Ordered: none today  Social/HH: no home health. Lives outside of [place], about 20 miles lives with her husband, 2 llamas, dogs, chickens    LABS:    No results found for this or any previous visit (from the past 168 hour(s)).    SUBJECTIVE:  Patient reports decreased pain, no odor, no drainage feeling well, no new concerns and no problems with wound.   Dressing changes performed by patient since last visit: painting with betadine daily - cleaned it off prior to visit here so we can evaluate. Trying to do exercises of the fingers to reduce swelling and increase usage. Still limited in use of the finger and right hand and right foot.  Patient is accompanied by patient and spouse.   Patient's impression is that the wound is: improved.    History of Extremity arterial disease, extremity bypass or extremity stent: no  History of Heart Valve replacement or disease requiring endocarditis prophalaxis: yes      Patient is protecting wound with water proof dressing in shower (instructions importance of keeping wound protected given)    Previous chronic wounds: denies  denies travel out of the country in temporal relation to wound  Ambulatory Status: no  assistive devices    ADV DIRECTIVE:  FULL CODE    Past Medical History:   Diagnosis Date   . History of blood transfusion    . 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 AORTIC VALVE REPLACEMENT     . PROCEDURE N/A 01/27/2016    Procedure: AORTIC VALVE REPLACEMENT; INTRA AORTIC BALLOON INSERTION;  Surgeon: Otho Bellows, MD;  Location: [facility] OR;  Service: Cardiology;  Laterality: N/A;   . PROCEDURE Right 03/10/2016    Procedure: AMPUTATION TOE;  Surgeon: Chrystie Nose, DPM;  Location: [facility] OR;  Service: Podiatry;  Laterality: Right;   . TONSILLECTOMY     . TUBAL LIGATION  1980       Current Outpatient Prescriptions   Medication Sig Dispense Refill   . acetaminophen (TYLENOL) 650 MG CR tablet Take 1 tablet by mouth every 8 hours as needed for Pain. 30 tablet 0   . aspirin 81 MG EC tablet Take 1 tablet by mouth daily.     Marland Kitchen atorvastatin (LIPITOR) 20 MG tablet Take 1 tablet by mouth every night at bedtime. 90 tablet 3   . carvedilol (COREG) 6.25 MG tablet Take 1 tablet by mouth 2 times daily with meals. 180 tablet 3   . HYDROcodone-acetaminophen (NORCO) 5-325 mg per tablet Take 1 tablet by mouth every 6 hours as needed for Pain.  30 tablet 0   . warfarin (COUMADIN) 6 MG tablet Take 1 tablet by mouth daily. Or as directed by [facility] (Patient taking differently: Take 6 mg by mouth daily. Or as directed by Marijean Heath & Fri take 9mg ) 90 tablet 1     No current facility-administered medications for this visit.        Allergies   Allergen Reactions   . Heparin Anaphylaxis     Possible HIT   . Sulfa (Sulfonamide Antibiotics) Rash     In ICU for 3 days post dose         Family History   Problem Relation Age of Onset   . Cancer Mother    . Heart disease Father    . Heart disease Sister    . Cancer Brother    . Cancer Maternal Aunt    . Heart disease Maternal Aunt    . Cancer Maternal Uncle    . Heart disease Maternal Uncle        Social History     Social  History   . Marital status: Married     Spouse name: N/A   . Number of children: N/A   . Years of education: N/A     Occupational History   . Not on file.     Social History Main Topics   . Smoking status: Passive Smoke Exposure - Never Smoker   . Smokeless tobacco: Never Used   . Alcohol use No   . Drug use: No   . Sexual activity: Not Currently     Partners: Male     Other Topics Concern   . Not on file     Social History Narrative   . No narrative on file       Review of Systems   General/Constitutional: Negative for fever and chills.   Gastrointestinal: Negative for nausea and vomiting.       PHYSICAL EXAM:   VITAL SIGNS:                                                GENERAL:  Patient is awake alert acting appropriately and in no distress with clear well articulated speech.  PULMONARY:    No increased work of breathing.  ABDOMEN:   without guarding.   NEURO:  MAE, Nonfocal    LOWER EXTREMITIES:      (Y=Present  N=Negative or not present  Empty field=not assessed)  (Edema Scale:0=None  1=Mild   2=Moderate  3=Severe)        DERMAL APPENDAGES:   Nails: are not Thickened discolored:  There is Hair growth below knee: Bilaterally    CIRCULATION-VENOUS-LYMPH:   Right Left Bilateral   DP Pulse triphasic     PT Pulse biphasic     Digit Refill (<sec) 2     Edema (0-3+) no     Hemosid no     Varicosities no     Palor/Cyanosis no     Distal Temp  warm       SENSATION:    Light touch: is  normal  Bilaterally        MOTOR:    Foot Deformities: none noted. 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  Age of wound at [facility]: 8.71 number of weeks.  Treatment Week #: 8.14. Measures   X   X  .                                                                                                    Tissue Type: intact epithelium                       Drainage Amount: None       Wound is:: stable   Peri-wound Assessment: clear   Peri-wound is:: stable    Pain since last visit: 0    Wound Number: 3 Right  Second Finger.  Age of wound at [facility]: 8.71 number of weeks.  Treatment Week #: 8.14.  Measures   X   X  .                                                                                                    Tissue Type: eschar   Tissue Quality: dry                   Drainage Amount: None       Wound is:: stable   Peri-wound Assessment: crusty   Peri-wound is:: stable                DEBRIDEMENT PROCEDURE NOTE    PARQ Risks discussed with patient including bleeding, pain infection and damage to deeper tissues.  All questions were answered and patient wanted to proceed    Based on my assessment the patient required sharp debridement today.  Indications for debridement  _x__ Nonviable tissue/wound debris--to leave healthier wound base  ___ Undermined margin-to leave more attached  wound margins  ___ Rolled margin-to leave more attached and/or beveled margins     ___ To bevel wound margins  ___ Callus--To leave more pliable skin   _x__ To trim back Loose edge of Fibrotic Cap or Eschar  ___ Trim Hyperkeratotic Epithelium from wound edge  ___ Removed Rolled Edge  ___ To obtain tissue culture    Wound Number: 1  Second Toe  Right   Peri-wound cleansed with: 3N1 Foam          Lidocaine applied: no   Wound did not require topical anesthesia.   Excisional Debridement By Provider: hyperkeratotic tissue   Percent of wound debrided: 100 %   Tissue debrided using: Tissue nippers  Pain after wound care: controlled           Wound Number: 3  Second Finger  Right   Peri-wound cleansed with: 3N1 Foam          Lidocaine applied: no   Wound did not require topical anesthesia.   Excisional Debridement By Provider: nonviable epithelial tissue, nonviable dermal tissue   Percent of wound debrided: 100 %   Tissue debrided using: Tissue nippers   Post debridement cleansed with:: Betadine                                           First Layer: Betadine                                                   Pain after wound care: controlled   Dressing changes: daily    Patient tolerated the procedure well with less than 0 ML blood loss.      Debridement was successful and post Debridement the site showed a healthier, more viable base.     TREATMENT:    Wound Number: 1  Second Toe  Right   Wound treatment orders: treatment ordered as performed below.   Peri-wound cleansed with: 3N1 Foam     and     Lidocaine applied: no               Wound will be redressed with                                                                               Wound Number: 3  Second Finger  Right   Wound treatment orders: treatment ordered as performed below.   Peri-wound cleansed with: 3N1 Foam     and     Lidocaine applied: no               Wound will be redressed with                          First Layer: Betadine                                                        DIAGNOSIS:  The following is the patient's primary diagnosis and any associated comorbidities that effect or could slow patient's healing   Encounter Diagnoses   Name Primary?   . 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 Impaired activities of daily living    . S/P AVR (aortic valve replacement)    . Heparin induced thrombocytopenia (CMS/HCC)    . 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  Dry gangrene (CMS/HCC)        DISCUSSION/ PLAN/ INSTRUCTIONS:     Debridement required again today. Right second and right fourth toes are healed. Right finger is again improved, I do see new nail growth coming in at the base of the nail which is a good sign. There is still moderate swelling at the tip of the finger but the erythema is significantly reduced. She will continue to do her hand and foot exercises as recommended. I did offer to order her formal physical therapy but she declines at  this time. We'll see her back again in about 3 weeks to reevaluate the finger.      Conservative sharp debridement. Recent AVR so she will need antibiotics prior to [place] doing any extensive debridement. These three thrombus wounds caused by HITT appear to be healing well. No cellulitis. Will have her return Q 2 weeks for conservative debridement and following the wound.      Patient wound appears:  improved  Patient had no questions and is in agreement with this plan of care.  New concerns with Circulation/Hypoxia   no  Requiring debridement (sharp, enzymatic or autolytic employed)  yes  Signs or symptoms of Infection or is inflammation a problem   no  Is edema a concern:   no  Is moisture balance in wound a current problem   no  Is off loading, shear or repetitive trauma an issue   no   Diagnostics/ Referrals ordered    No orders of the defined types were placed in this encounter.    Advaced Therapies/ RXs ordered    New Prescriptions    No medications on file     Systemic Issues/Education  Patient education and self care measures reinforced today regarding: wound care procedure and skin care    All patient's questions were answered and plan fully discussed with patient    FOLLOW UP:  No Follow-up on file.    This chart was produced with the EPIC system using voice recognition software, manual transcription, or both. Despite concurrent proofreading, please note that transcription errors are common and may not reflect the true intent of reporter.

## 2016-05-31 ENCOUNTER — Ambulatory Visit: Admit: 2016-05-31 | Discharge: 2016-05-31 | Payer: MEDICAID | Attending: DO

## 2016-05-31 ENCOUNTER — Encounter: Admit: 2016-05-31 | Discharge: 2016-05-31 | Payer: MEDICAID | Attending: MD

## 2016-05-31 DIAGNOSIS — I359 Nonrheumatic aortic valve disorder, unspecified: Secondary | ICD-10-CM

## 2016-05-31 NOTE — Progress Notes (Signed)
Brandy [name] is a 60 y.o. female.  Chief Complaint   Patient presents with   . Chronic Disease Management     Aortic valve disorder, ischemia of digits       SUBJECTIVE:    60 year old female seen today for the following problems:    1. Aortic valve disorders  Has seen cardiologist recently. No longer taking Coumadin. Only on aspirin 325 mg daily and Coreg 6.25 mg daily. No complaints of palpitations chest pain shortness of breath.    2. S/P AVR (aortic valve replacement)  See # 1    3. 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  Distal gangrene has resolved.    5. NSTEMI (non-ST elevated myocardial infarction) (CMS/HCC)  Stable on beta blocker and statin    6. Screening for breast cancer  Declines mammography screening at this time    She is up-to-date on flu vaccine this year.  The following portions of the patient's history were reviewed and updated as appropriate: allergies, current medications, past family history, past medical history, past surgical histroy and problem list.      Allergies   Allergen Reactions   . Heparin Anaphylaxis     Possible HIT   . Sulfa (Sulfonamide Antibiotics) Rash     In ICU for 3 days post dose       Patient Active Problem List   Diagnosis SNOMED CT(R)   . Heart failure (CMS/HCC) HEART FAILURE   . NSTEMI (non-ST elevated myocardial infarction) (CMS/HCC) ACUTE NON-ST SEGMENT ELEVATION MYOCARDIAL INFARCTION   . Demand ischemia of myocardium (CMS/HCC) ACUTE ISCHEMIC HEART DISEASE   . Pleural effusion PLEURAL EFFUSION   . Obesity OBESITY   . Iron deficiency anemia IRON DEFICIENCY ANEMIA   . Thrombocytosis (CMS/HCC) THROMBOCYTOSIS   . Weight loss WEIGHT LOSS   . Family history of cancer FAMILY HISTORY OF CANCER   . Cardiomegaly CARDIOMEGALY   . Hilar lymphadenopathy- bilataeral  HILAR LYMPHADENOPATHY   . Liver lesion- small hypodense  lesion around falciform lig LESION OF LIVER   . Aortic stenosis, severe AORTIC VALVE STENOSIS   . Non-ischemic cardiomyopathy (CMS/HCC) CARDIOMYOPATHY   . S/P AVR (aortic valve replacement) HISTORY OF AORTIC VALVE REPLACEMENT   . 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 Aortic valve disorder AORTIC VALVE DISORDER   . 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 Aortic valve disorders AORTIC VALVE DISORDER   . 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 acetaminophen (TYLENOL) 650 MG CR tablet Take 1 tablet by mouth every 8 hours as needed for Pain. 30 tablet 0   . aspirin 325 MG EC tablet Take 325 mg by mouth daily.     Marland Kitchen atorvastatin (LIPITOR) 20 MG tablet Take 1 tablet by mouth every night at bedtime. 90 tablet 3   . carvedilol (COREG) 6.25 MG tablet Take 1 tablet by mouth 2 times daily with meals. 180 tablet 3     No current facility-administered medications for this visit.      Social History     Social History Main Topics   . Smoking status: Passive Smoke Exposure - Never Smoker   . Smokeless tobacco: Never Used   . Alcohol use  No   . Drug use: No   . Sexual activity: Not Currently     Partners: Male     Social History   . Marital status: Married     Spouse name: N/A   . Number of children: N/A   . Years of education: N/A     Other Topics Concern   . Not on file     Past Surgical History:   Procedure Laterality Date   . AORTIC VALVE REPLACEMENT     . PROCEDURE N/A 01/27/2016    Procedure: AORTIC VALVE REPLACEMENT; INTRA AORTIC BALLOON INSERTION;  Surgeon: Otho Bellows, MD;  Location: [facility] OR;  Service: Cardiology;  Laterality: N/A;   . PROCEDURE Right 03/10/2016    Procedure: AMPUTATION TOE;  Surgeon: Chrystie Nose, DPM;  Location: [facility] OR;  Service: Podiatry;  Laterality: Right;   . TONSILLECTOMY     . TUBAL LIGATION  1980     Family History   Problem  Relation Age of Onset   . Cancer Mother    . Heart disease Father    . Heart disease Sister    . Cancer Brother    . Cancer Maternal Aunt    . Heart disease Maternal Aunt    . Cancer Maternal Uncle    . Heart disease Maternal Uncle          ROS  General: no fatigue or malaise.  Eyes: no vision change.  ENT: no rhinorrhea or sore throat.  Respiratory: no cough or dyspnea.  Cardiovascular: no chest pain or palpitations.  GI: no nausea, vomiting, diarrhea, constipation, melena or hematochezia.  GU: no dysuria or hematuria.  MSK: no joint pain or swelling.  Neuro: No new headache, visual disturbance, new muscle weakness, imbalance, seizure, history of stroke.   Hematologic: No easy bruising, easy/excessive bleeding.  Psychiatric: no agitation, no behavioral changes,no depression or anxiety.   Skin: no rash or changing skin lesions.       OBJECTIVE:  BP 138/74   Pulse 97   Ht 5' 5.98 (1.676 m)   Wt 217 lb (98.4 kg)   SpO2 98%   BMI 35.05 kg/m?   Body mass index is 35.05 kg/m?Marland Kitchen  Physical Exam  Gen: no acute distress, well nourished  Head normocephalic. PERRLA. Posterior pharynx clear.EOM's intact. Tympanic membranes clear.  Neck: supple without nodes, JVD, thyromegaly  Resp: good air movement, no retractions, symetrical breath sounds, without rales or wheezing  CV: RRR, normal III/VI systolic murmur no gallop  Abdomen is soft.Bowel sounds are normoactive.No abdominal tenderness noted.  Ext: no cyanosis or edema  MSK: normal gait  Psych: normal affect        ASSESSMENT & PLAN:         ICD-9-CM ICD-10-CM    1. Aortic valve disorders 424.1 I35.9    2. S/P AVR (aortic valve replacement) V43.3 Z95.2    3. Ischemia of digits of hands and feet 459.9 I99.8    4. 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 NSTEMI (non-ST elevated myocardial infarction) (CMS/HCC) 410.70 I21.4    6. Screening for breast cancer V76.10 Z12.31    Continue aspirin 325 mg daily.  Continue Lipitor 20 mg  daily.  Continue Coreg 6.25 mg one tablet twice daily.  Repeat CBC, chem panel and lipid panel prior to next visit.  Return to clinic 3 months or as needed.  ?  This note was  transcribed using speech recognition software. Please contact us for clarification if any questions arise relating to the wording of this document.

## 2016-05-31 NOTE — Progress Notes (Addendum)
CARDIOTHORACIC SURGERY FOLLOW UP CLINIC NOTE    Subjective: Patient doing well status post aortic valve replacement.    Multiple postop complications please review discharge summary.    Underwent toe amputation over a month ago. This is healing up well. Her breathing is great, no signs and symptoms of infection.      Objective:    BP 108/72 (BP Location: Left arm, Patient Position: Sitting)   Pulse 94   Temp 36.2 ?C (97.2 ?F) (Temporal)   Resp 16   Ht 5' 5.98 (1.676 m)   Wt 213 lb (96.6 kg)   SpO2 98%   BMI 34.40 kg/m?  No flowsheet data found.    GENERAL: she appears alert and oriented and in no apparent distress  LUNGS: Clear to auscultation bilaterally; no wheezes heard  HEART: Exam reveals a regular rate and rhythm  SKIN: Her surgical wound appears warm and dry without evidence of infection, inflammation or fluid collection.  EXTREMITIES: There is no edema present.    Diagnostics:      Outpatient Prescriptions Marked as Taking for the 05/31/16 encounter (Post-Op) with Otho Bellows, MD   Medication Sig Dispense Refill   . acetaminophen (TYLENOL) 650 MG CR tablet Take 1 tablet by mouth every 8 hours as needed for Pain. 30 tablet 0   . atorvastatin (LIPITOR) 20 MG tablet Take 1 tablet by mouth every night at bedtime. 90 tablet 3   . carvedilol (COREG) 6.25 MG tablet Take 1 tablet by mouth 2 times daily with meals. 180 tablet 3   . [DISCONTINUED] aspirin 81 MG EC tablet Take 1 tablet by mouth daily. (Patient not taking: Reported on 05/31/2016)     . [DISCONTINUED] warfarin (COUMADIN) 6 MG tablet Take 1 tablet by mouth daily. Or as directed by [facility] (Patient not taking: Reported on 05/31/2016) 90 tablet 1       LABS:  Lab Results   Component Value Date    NA 140 04/11/2016    K 3.70 04/11/2016    CL 104.0 04/11/2016    CO2 25 04/11/2016    GLU 78 04/11/2016    BUN 11 04/11/2016    CREATININES 0.62 04/11/2016    CALCIUM 10.8 (H) 04/11/2016    AST 25 02/17/2016    ALT 20 02/17/2016    ALKPHOS 88 02/17/2016      BILITOT 0.90 01/21/2016    ALBUMIN 2.3 (L) 02/17/2016    ALBUMIN 2.3 (L) 02/17/2016    GLOB 2.9 02/17/2016    AGRATIO 0.8 (L) 02/17/2016    ANIONGAP 15.0 04/11/2016    LABGLOM >60.0 04/11/2016    LABGLOM >60.0 03/06/2016    WHITEBLOODCE 6.7 03/06/2016    WHITEBLOODCE 6.7 03/06/2016    LABPLAT 305 03/06/2016    HGB 12.1 03/06/2016    PROTIME 13.3 (H) 03/12/2016    INR 2.1 (External Abn) 05/16/2016    APTT 38 (H) 02/17/2016       Impression:  Patient is status post aortic valve replacement. she did have heparin-induced thrombocytopenia with thrombosis as a complication and had brief dialysis runs for support.    Recommendations:    Okay to stop Coumadin for cardiac surgery  Tissue valve only requires 3 months anticoagulation      This note was transcribed using voice recognition software. Please contact us for clarification if any questions arise relating to the wording of this document.     Otho Bellows MD  Cardiothoracic Surgery

## 2016-06-08 ENCOUNTER — Encounter: Payer: MEDICAID | Attending: Family

## 2016-06-13 ENCOUNTER — Encounter

## 2016-06-22 ENCOUNTER — Institutional Professional Consult (permissible substitution): Admit: 2016-06-22 | Discharge: 2016-06-22 | Payer: MEDICAID | Attending: Family

## 2016-06-22 DIAGNOSIS — L97511 Non-pressure chronic ulcer of other part of right foot limited to breakdown of skin: Secondary | ICD-10-CM

## 2016-06-22 MED ORDER — mupirocin (BACTROBAN) 2 % ointment 1-5 g
2 | TOPICAL | Status: DC | PRN
Start: 2016-06-22 — End: 2016-09-20

## 2016-06-22 NOTE — Progress Notes (Signed)
HISTORY AND ASSESSMENT:  Brandy [name] is a 60 y.o. female here for Follow Up Care Of Ulcer.  Patient is accompanied by spouse.  Report decreased pain, no drainage feeling well, no new concerns and no problems with wound.  Betadine applied daily by patient.  Oral pain medication taken by patient pre-visit? No.     Medications: No changes since last visit.  Allergies: No changes since last visit.    MD/Provider visits since last AWC visit: None     Labs/Tests/Procedures performed since last visit: None   Falls since last visit: No  Ambulatory Aids: None  Mobility: Independent    Primary language: English    See these sections of Medical Record for additional information related to this encounter:    PAST MEDICAL HISTORY  PAST SURGICAL HISTORY  SOCIAL HISTORY  ALLERGIES  MEDICATIONS  FAMILY HISTORY  VITAL SIGNS FLOW SHEET  POC GLUCOSE  OUTPATIENT WOUND ASSESSMENT FLOW SHEET  ENCOUNTER DIAGNOSIS  PROBLEM LIST  ORDERS    TREATMENT:  Patient assisted with undressing and redressing? Yes  Patient prepped and positioned for care? Yes  Patient required repositioning for additional care? No  1:1 nursing staff required for wound care and positioning patient.  Cleansed periwound with 3in1 foam.   Poor hygiene required need for extensive cleansing?  No    COORDINATION OF CARE:  Orders given at the time of visit to patient and family  Referrals sent to N/A.  Visit information sent to N/A.    PATIENT EDUCATON:  Instructed verbally regarding wound care procedure  Reinforced self care measures for N/A.  Patient had no questions and is in agreement with this plan of care.    The patient and family verbalize understanding of instruction.    See these sections of Medical Record for additional information related to this encounter:    PATIENT INSTRUCTIONS  FOLLOWUP  SUPPLIES SENT HOME WITH PATIENT FLOW SHEET

## 2016-06-22 NOTE — Progress Notes (Signed)
PRIMARY CARE PROVIDER:  [name], DO    REFERRING PROVIDER: [name], DO    REASON FOR REFERRAL: Follow Up Care Of Ulcer    HISTORY OF PRESENT ILLNESS:  Date of Presentation to wound center: 06/22/2016  Date of Occurrence: 01/28/16  Wound Location: right second and fourth toes, right pointer finger  Wound Cause:  HITT  Admissions or Prior Evaluations: Dr Margot Ables, took off the right third toe. While in hospital. [facility] is instructed to follow up with him for the right third toe.  Topical Treatments & ABX: betadine  Related LABS/CULTURES of wound: denies  Related IMAGING:   denies  Off Loading: NA  Compression: NA  Prior Lower Extrem Vascular Surgery denies  LAST Tetanus Vaccine:  Close to 10 years. She is instructed to discuss with Dr. Sudie Bailey  DME Ordered: none today  Social/HH: no home health. Lives outside of [place], about 20 miles lives with her husband, 2 llamas, dogs, chickens    LABS:    No results found for this or any previous visit (from the past 168 hour(s)).    SUBJECTIVE:  Patient reports decreased pain, no odor, no drainage feeling well, no new concerns and no problems with wound.   Feels wounds are almost resolved. She states the only thing she still has now is the necrotic tip of the first right finger and the necrotic nail.  Patient is accompanied by patient and spouse.   Patient's impression is that the wound is: improved.    History of Extremity arterial disease, extremity bypass or extremity stent: no  History of Heart Valve replacement or disease requiring endocarditis prophalaxis: yes      Patient is protecting wound with water proof dressing in shower (instructions importance of keeping wound protected given)    Previous chronic wounds: denies  denies travel out of the country in temporal relation to wound  Ambulatory Status: no assistive devices    ADV DIRECTIVE:  FULL CODE    Past Medical History:   Diagnosis Date   . History of blood transfusion    . 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 AORTIC VALVE REPLACEMENT     . PROCEDURE N/A 01/27/2016    Procedure: AORTIC VALVE REPLACEMENT; INTRA AORTIC BALLOON INSERTION;  Surgeon: Otho Bellows, MD;  Location: [facility] OR;  Service: Cardiology;  Laterality: N/A;   . PROCEDURE Right 03/10/2016    Procedure: AMPUTATION TOE;  Surgeon: Chrystie Nose, DPM;  Location: [facility] OR;  Service: Podiatry;  Laterality: Right;   . TONSILLECTOMY     . TUBAL LIGATION  1980       Current Outpatient Prescriptions   Medication Sig Dispense Refill   . acetaminophen (TYLENOL) 650 MG CR tablet Take 1 tablet by mouth every 8 hours as needed for Pain. 30 tablet 0   . aspirin 325 MG EC tablet Take 325 mg by mouth daily.     Marland Kitchen atorvastatin (LIPITOR) 20 MG tablet Take 1 tablet by mouth every night at bedtime. 90 tablet 3   . carvedilol (COREG) 6.25 MG tablet Take 1 tablet by mouth 2 times daily with meals. 180 tablet 3     Current Facility-Administered Medications   Medication Dose Route Frequency Provider Last Rate Last Dose   . mupirocin (BACTROBAN) 2 % ointment 1-5 g  1-5 g Topical PRN Bubba Hales, FNP  Allergies   Allergen Reactions   . Heparin Anaphylaxis     Possible HIT   . Sulfa (Sulfonamide Antibiotics) Rash     In ICU for 3 days post dose         Family History   Problem Relation Age of Onset   . Cancer Mother    . Heart disease Father    . Heart disease Sister    . Cancer Brother    . Cancer Maternal Aunt    . Heart disease Maternal Aunt    . Cancer Maternal Uncle    . Heart disease Maternal Uncle        Social History     Social History   . Marital status: Married     Spouse name: N/A   . Number of children: N/A   . Years of education: N/A     Occupational History   . Not on file.     Social History Main Topics   . Smoking status: Passive Smoke Exposure - Never Smoker   . Smokeless tobacco: Never Used   . Alcohol use No   . Drug use: No   . Sexual activity: Not Currently      Partners: Male     Other Topics Concern   . Not on file     Social History Narrative   . No narrative on file       Review of Systems   General/Constitutional: Negative for fever and chills.   Gastrointestinal: Negative for nausea and vomiting.       PHYSICAL EXAM:   VITAL SIGNS:                                                GENERAL:  Patient is awake alert acting appropriately and in no distress with clear well articulated speech.  PULMONARY:    No increased work of breathing.  ABDOMEN:   without guarding.   NEURO:  MAE, Nonfocal    LOWER EXTREMITIES:      (Y=Present  N=Negative or not present  Empty field=not assessed)  (Edema Scale:0=None  1=Mild   2=Moderate  3=Severe)        DERMAL APPENDAGES:   Nails: are not Thickened discolored:  There is Hair growth below knee: Bilaterally    CIRCULATION-VENOUS-LYMPH:   Right Left Bilateral   DP Pulse triphasic     PT Pulse biphasic     Digit Refill (<sec) 2     Edema (0-3+) no     Hemosid no     Varicosities no     Palor/Cyanosis no     Distal Temp  warm       SENSATION:    Light touch: is  normal  Bilaterally        MOTOR:    Foot Deformities: none noted. 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 User may not have seen previous data.) Right  Second Toe.  Age of wound at [facility]: 8.71 number of weeks.  Treatment Week #: 12.14. Measures   X   X  .  Tissue Type: intact epithelium                       Drainage Amount: None       Wound is:: stable   Peri-wound Assessment: clear   Peri-wound is:: stable   Pain since last visit: 0    Wound Number: 3 (Simultaneous filing. User may not have seen previous data.) Right  Second Finger.  Age of wound at [facility]: 60.86 number of weeks.  Treatment Week #: -40.  Measures   X   X  .                                                                                                     Tissue Type: eschar   Tissue Quality: dry                   Drainage Amount: None       Wound is:: stable   Peri-wound Assessment: clear   Peri-wound is:: stable   Pain since last visit:  (numbness except when bump then painful)            DEBRIDEMENT PROCEDURE NOTE    PARQ Risks discussed with patient including bleeding, pain infection and damage to deeper tissues.  All questions were answered and patient wanted to proceed    Based on my assessment the patient required sharp debridement today.  Indications for debridement  _x__ Nonviable tissue/wound debris--to leave healthier wound base  ___ Undermined margin-to leave more attached  wound margins  ___ Rolled margin-to leave more attached and/or beveled margins     ___ To bevel wound margins  ___ Callus--To leave more pliable skin   _x__ To trim back Loose edge of Fibrotic Cap or Eschar  ___ Trim Hyperkeratotic Epithelium from wound edge  ___ Removed Rolled Edge  ___ To obtain tissue culture      Wound Number: 3 (Simultaneous filing. User may not have seen previous data.)  Second Finger  Right   Peri-wound cleansed with: 3N1 Foam          Lidocaine applied: no   Wound did not require topical anesthesia.   Excisional Debridement By Provider:  (nail removed)   Percent of wound debrided: 100 %   Tissue debrided using: Tissue nippers   Post debridement cleansed with:: Saline  Gauze                                       First Layer: Mupirocin 2% (ointment)   Second Layer: Mepilex Border Lite 1.6x2                                               Pain after wound care: controlled   Dressing changes: daily    Patient tolerated the procedure well with less than 0 ML blood loss.  Debridement was successful and post Debridement the site showed a healthier, more viable base.     TREATMENT:    Wound Number: 1 (Simultaneous filing. User may not have seen previous data.)  Second Toe  Right   Wound treatment orders: treatment ordered as performed below.    Peri-wound cleansed with: 3N1 Foam     and     Lidocaine applied: no               Wound will be redressed with                          First Layer: Leave open to air                                                    Wound Number: 3 (Simultaneous filing. User may not have seen previous data.)  Second Finger  Right   Wound treatment orders: treatment ordered as performed below.   Peri-wound cleansed with: 3N1 Foam     and     Lidocaine applied: no               Wound will be redressed with                          First Layer: Mupirocin 2% (ointment)   Second Layer: Mepilex Border Lite 1.6x2                                                    DIAGNOSIS:  The following is the patient's primary diagnosis and any associated comorbidities that effect or could slow patient's healing   Encounter Diagnoses   Name Primary?   . 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 Impaired activities of daily living    . S/P AVR (aortic valve replacement)    . Heparin induced thrombocytopenia (CMS/HCC)    . 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 Dry gangrene (CMS/HCC)        DISCUSSION/ PLAN/ INSTRUCTIONS:     Debridement required at the right first finger today. The nail is removed and hyperkeratotic and necrotic tissue is removed. The nail bed appears intact and a new nail is seen growing in the base. She did lose part of that finger, the tip, but it should not affect her motility. This should not affect the nail. All wounds now are completely healed. I will go ahead and discharge her today. She is to contact us for any additional issues or concerns.    Patient wound appears:  Resolved  Patient had no questions and is in agreement with this plan of care.  New concerns with Circulation/Hypoxia   no  Requiring debridement (sharp, enzymatic or autolytic employed)  yes  Signs or symptoms of Infection or is inflammation a problem    no  Is edema a concern:   no  Is moisture balance in wound a current problem   no  Is off loading, shear or  repetitive trauma an issue   no   Diagnostics/ Referrals ordered    No orders of the defined types were placed in this encounter.    Advaced Therapies/ RXs ordered    New Prescriptions    No medications on file     Systemic Issues/Education  Patient education and self care measures reinforced today regarding: wound care procedure and skin care    All patient's questions were answered and plan fully discussed with patient    FOLLOW UP:  No Follow-up on file.    This chart was produced with the EPIC system using voice recognition software, manual transcription, or both. Despite concurrent proofreading, please note that transcription errors are common and may not reflect the true intent of reporter.    CC: Brandy [name]

## 2016-07-20 NOTE — Telephone Encounter (Signed)
Opened in error

## 2016-08-04 NOTE — Telephone Encounter (Signed)
Patient called to cancel her upcoming appointment with Dr. Sudie Bailey due to a change in her insurance.  Patient stated that atorvastatin (LIPITOR) 20 MG tablet was not approved by her insurance and patient could not afford to pay out of pocket so she does not have this medication.  Patient wants to know if this is a medication that she absolutely needs to take.  Patient also wanted to thank Dr. Sudie Bailey and staff for all of their help. Please call patient at [phone] (home)

## 2016-08-04 NOTE — Telephone Encounter (Signed)
Pt is no longer on an open plan and has to go to a doctor on the [facility].  Pt will make appt to establish with provider in [place].  Advised pt that in the meantime the lipitor is aprx 10.00 with [facility].

## 2016-08-29 ENCOUNTER — Encounter: Payer: MEDICAID | Attending: DO

## 2016-09-06 ENCOUNTER — Ambulatory Visit: Admit: 2016-09-06 | Discharge: 2016-09-06

## 2016-09-06 DIAGNOSIS — E039 Hypothyroidism, unspecified: Secondary | ICD-10-CM

## 2016-09-06 LAB — LIPID PANEL W/ REFLEX DIRECT LDL
Chol/HDL Ratio: 3.3 (ref 0.0–5.0)
Cholesterol, HDL: 40 mg/dL — ABNORMAL LOW (ref >=60)
Cholesterol: 133 mg/dL (ref <=200)
LDL Calculated: 69 mg/dL (ref 0–129)
Triglyceride: 118 mg/dL (ref <=150.0)
VLDL Cholesterol Calculation: 23.6 mg/dL (ref 7.0–32.0)

## 2016-09-06 LAB — HEPATITIS C ANTIBODY SCREEN W/RNA PCR REFLEX: Hepatitis C Antibody Screen: NEGATIVE

## 2016-09-06 LAB — TSH: TSH - Thyroid Stimulating Hormone: 2.54 u[IU]/mL (ref 0.34–5.60)

## 2016-09-06 NOTE — Progress Notes (Signed)
Subjective     HPI:  Brandy [name] is a 61 y.o. female with h/o HFrEF, severe AS s/p AVR with complicated hospital course (HIT, ARF, dialysis, need for intra-aortic balloon pump) who presents today to establish care. No prior PCP.    Denies chest pain, palpitations, shortness of breath, PND, orthopnea, lower extremity edema, weight gain. No sudden vision loss, dysphagia, dysarthria, focal weakness or focal sensory    Sees Dr. Crissie Figures at [facility] (last 08/21/16, next August 2018)    Patient reports history of hypothyroidism and being on what sounds like synthroid in the distant past. Endorsing hair loss today.    Additional HPI documented directly in the problem list, as copied below.     ROS:  See HPI    History:   Problem list, past medical history, family history, and social history were reviewed and updated where appropriate as below.    Non-Hospital Problem List as of 09/06/2016       Non-Hospital    Demand ischemia (CMS/HCC)    Overview     ? 01/2016 [facility] admission found to have trop ~0.08 with chest pain. Clean cath by Dr. Crissie Figures         Pleural effusion    Obesity    Iron deficiency anemia    Weight loss    Family history of cancer    Overview     Ovarian and colon cancer          Hilar lymphadenopathy- bilataeral     Liver lesion- small hypodense lesion around falciform lig    Non-ischemic cardiomyopathy (CMS/HCC)    Overview     ? 02/2016 TTE: Sinus rhythm. Moderate concentric left ventricular hypertrophy (LVH).  Normal left ventricular (LV) size.  Below normal function.  Ejection fraction (EF) 55-60%. Normal right ventricular chamber size and function.  No evidence of pulmonary arterial hypertension but the inferior vena cava (IVC) appears enlarged, suggesting a central venous pressure (CVP) of 10 mmHg. 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 No significant pericardial or pleural effusion. Compared to prior echo from February 02, 2016, left ventricular (LV) function has improved. Right ventricular (RV) chamber  size and function nearly normalized.  ? 01/2016 cath: Functionally Normal Coronary Anatomy  and Aortic Valve Disease by Dr. Crissie Figures  ? Medical management: ASA, lipitor, coreg, ?ace         Heparin induced thrombocytopenia (CMS/HCC)    Aortic valve disorder    Overview     ? S/p AVR 23mm MAGNA edwards  ? Complicated by HIT and subsequent necrosis of distal digits, need for intra-aortic balloon pump to get off cardiopulmonary bypass, and ARF requiring period of dialysis  ? On anticoagulation. 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 History of blood transfusion    . 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 AORTIC VALVE REPLACEMENT     . PROCEDURE N/A 01/27/2016    Procedure: AORTIC VALVE REPLACEMENT; INTRA AORTIC BALLOON INSERTION;  Surgeon: Otho Bellows, MD;  Location: [facility] OR;  Service: Cardiology;  Laterality: N/A;   . PROCEDURE  Right 03/10/2016    Procedure: AMPUTATION TOE;  Surgeon: Chrystie Nose, DPM;  Location: [facility] OR;  Service: Podiatry;  Laterality: Right;   . TONSILLECTOMY     . TUBAL LIGATION  1980     Social History   Substance Use Topics   . Smoking status: Passive Smoke Exposure - Never Smoker   . Smokeless tobacco: Never Used   . Alcohol use No     Family History   Problem Relation Age of Onset   . Cancer Mother    . Heart disease Father    . Heart disease Sister    . Cancer Brother    . Cancer Maternal Aunt    . Heart disease Maternal Aunt    . Cancer Maternal Uncle    . Heart disease Maternal Uncle      Current Outpatient Prescriptions on File Prior to Visit   Medication Sig Dispense Refill   . acetaminophen (TYLENOL) 650 MG CR tablet Take 1 tablet by mouth every 8 hours as needed for Pain. 30 tablet 0   . aspirin  325 MG EC tablet Take 325 mg by mouth daily.     Marland Kitchen atorvastatin (LIPITOR) 20 MG tablet Take 1 tablet by mouth every night at bedtime. 90 tablet 3   . carvedilol (COREG) 6.25 MG tablet Take 1 tablet by mouth 2 times daily with meals. 180 tablet 3     Current Facility-Administered Medications on File Prior to Visit   Medication Dose Route Frequency Provider Last Rate Last Dose   . mupirocin (BACTROBAN) 2 % ointment 1-5 g  1-5 g Topical PRN Bubba Hales, FNP         Allergies   Allergen Reactions   . Heparin Anaphylaxis     Possible HIT   . Sulfa (Sulfonamide Antibiotics) Rash     In ICU for 3 days post dose       Objective     Vitals:  Vitals:    09/06/16 1419   BP: 128/80   BP Location: Left arm   Patient Position: Sitting   Pulse: 88   Resp: 20   Temp: 37.6 ?C (99.6 ?F)   TempSrc: Temporal   SpO2: 92%   Weight: 227 lb 8 oz (103.2 kg)   Height: 5' 3.5 (1.613 m)     Physical Examination:  Physical Exam   Constitutional: She is oriented to person, place, and time and well-developed, well-nourished, and in no distress. No distress.   HENT:   Head: Normocephalic and atraumatic.   Mouth/Throat: Oropharynx is clear and moist.   Eyes: Conjunctivae and EOM are normal.   Neck: Normal range of motion. Neck supple. No thyromegaly present.   Cardiovascular: Normal rate and regular rhythm.    Murmur (2/6 systolic ejection murmur) heard.  Pulmonary/Chest: Effort normal. She has no wheezes. She has no rales.   Abdominal: Soft.   Musculoskeletal: Normal range of motion. She exhibits no edema.   Lymphadenopathy:     She has no cervical adenopathy.   Neurological: She is alert and oriented to person, place, and time.   Skin: Skin is warm and dry. No rash noted. She is not diaphoretic.   Psychiatric: Mood, memory, affect and judgment normal.     Assessment / Plan     Brandy [name] is a 61 y.o. female with h/o HFrEF, severe AS s/p AVR with complicated hospital course (HIT, ARF, dialysis, need for intra-aortic balloon pump) who  presents today to establish  care.    Chronic conditions stable. Continue ASA, statin, coreg. Not on ACE.     Obtain records from [facility].     Repeat lipid panel. Hep C screen. TSH    Robina Ade, MD  09/06/2016 1:21 PM    Patient was discussed with Dr. Jarold Motto, who agrees with the above assessment and plan.

## 2016-09-06 NOTE — Progress Notes (Signed)
My findings concur with Dr. Robina Ade, MD's evaluation/plan of care and documentation including the following. Please see their note for details.    HPI: Brandy [name] is a 61 y.o. female here to establish care. History of AS s/p valve replacement, history of possible NSTEMI but had non-obstructive cath (likely demand ischemia), also had AKI on dialysis for a time, had balloon pump as well, and systolic CHF in 2017 with subsequent normal ECHO. Follows closely with Dr. Crissie Figures. On ASA, statin, coreg. Not on ACEI/ARB. Feeling well, no current complaints. No CP, dyspnea, palpitations, syncope, orthopnea, LE edema, weight gain, or other complaints. Taking her medications. Has never had pap, mammogram. In the last year had negative fecal occult blood test. Had normal A1c in the past year. Was on thyroid replacement in the past, but not for years. Notes hair loss recently.       SNOMED CT(R)   1. Hypothyroidism, unspecified type  HYPOTHYROIDISM   2. Aortic valve disorder  AORTIC VALVE DISORDER   3. 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 Atrial fibrillation, unspecified type (CMS/HCC)  ATRIAL FIBRILLATION   5. Demand ischemia (CMS/HCC)  ACUTE ISCHEMIC HEART DISEASE   6. Non-ischemic cardiomyopathy (CMS/HCC)  CARDIOMYOPATHY       EXAM: BP 128/80 (BP Location: Left arm, Patient Position: Sitting)   Pulse 88   Temp 37.6 ?C (99.6 ?F) (Temporal)   Resp 20   Ht 5' 3.5 (1.613 m)   Wt 227 lb 8 oz (103.2 kg)   SpO2 92%   BMI 39.67 kg/m?    See resident exam.  2/6 SEM, regular, lungs CTAB, no LE edema, intact distal pulses, no focal neuro deficits.     A/P: Brandy [name] is a 61 y.o. 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

## 2016-09-06 NOTE — Addendum Note (Signed)
Addended by: Robina Ade on: 09/06/2016 03:18 PM     Modules accepted: Level of Service

## 2016-09-06 NOTE — Progress Notes (Signed)
Chief Complaint   Patient presents with   . Establish Care     Pt reports no questions or concerns at this time.        Vitals:    09/06/16 1419   BP: 128/80   BP Location: Left arm   Patient Position: Sitting   Pulse: 88   Resp: 20   Temp: 37.6 ?C (99.6 ?F)   TempSrc: Temporal   SpO2: 92%   Weight: 227 lb 8 oz (103.2 kg)   Height: 5' 3.5 (1.613 m)       Smoking Counseling given: Yes      Annual Screening:        Annual screening not completed at this visit .                 Health Maintenance Due   Topic Date Due   . CERVICAL CANCER SCREENING  03/31/1976

## 2016-10-04 ENCOUNTER — Other Ambulatory Visit: Admit: 2016-10-05

## 2016-10-04 ENCOUNTER — Ambulatory Visit: Admit: 2016-10-04 | Discharge: 2016-10-04

## 2016-10-04 DIAGNOSIS — Z124 Encounter for screening for malignant neoplasm of cervix: Secondary | ICD-10-CM

## 2016-10-04 DIAGNOSIS — Z01419 Encounter for gynecological examination (general) (routine) without abnormal findings: Secondary | ICD-10-CM

## 2016-10-04 LAB — (INACTIVE) SLM AP PAP
General Category: NEGATIVE
Molecular Results: NEGATIVE

## 2016-10-04 NOTE — Progress Notes (Signed)
Brandy [name] 10/04/16    Discussion and examination with Dr. pace. This patient has presented for a well woman examination. She can to healthcare for the 1st time in decades in 2017 with severe aortic stenosis requiring a percutaneous valve replacement. She unfortunately had a postoperative complication of cardiogenic shock and required amputation of necrosis toes but has done well otherwise since then. She has a family history of both breast and ovarian cancer, no one affected is available for genetic testing.    In the context of a normal breast and pelvic examination, this patient's 1st Pap smear ever is taken and she's referred for a mammogram. She has a distant history result of a positive FIT screen in 2016, she will be referred for a colonoscopy. Screening laboratories appear appropriate and agree with assessment, note and plan for these and consideration of the genetics referral.    Susanne Greenhouse MD FAAFP

## 2016-10-04 NOTE — Progress Notes (Signed)
Chief Complaint   Patient presents with   . Physical Exam, Gynecologic     pap and breast exam   . Rash     under right breast       Vitals:    10/04/16 1315   BP: 130/78   BP Location: Left arm   Patient Position: Sitting   Pulse: 76   Resp: 16   Temp: 36.4 ?C (97.6 ?F)   TempSrc: Temporal   SpO2: 99%   Weight: 229 lb (103.9 kg)       Smoking Counseling given: No      Annual Screening:        Annual screening not completed at this visit .                 Health Maintenance Due   Topic Date Due   . CERVICAL CANCER SCREENING  03/31/1976

## 2016-10-04 NOTE — Progress Notes (Signed)
Subjective     CC: Well Woman Exam    HPI:  MENSTRUAL HISTORY:  Menarche: unknown  Length of flow: n/a  Intermenstrual length:  n/a  Recent change in menstrual cycle: menopause at age 61  Bowel/bladder issues: no    PAP HISTORY:    History of abnormal pap: no  Maternal DES exposure: unknown    SEXUAL HISTORY:  Currently sexually active? Yes  What age sexual activity initiated?  unknown  Partners are: female  Number of sexual partners: unknown   Contraception: none, post-menopausal  Methods of contraception used in the past: OCP    STD HISTORY:  Past STDs: never    FAMILY HISTORY:  Breast cancer: ?maternal aunt  Ovarian cancer: mother in 74s, maternal aunt age 37  Uterine cancer: n/a  Colon cancer: mother diagnosed 48s  Prostate cancer: uncle  Stomach cancer: no    SCREENING HISTORY:  Last mammogram: None  Last pap: Distant history  Last colonoscopy: 03/01/16 positive fecal occult blood    SOCIAL:  Smoking: never  Ca++ intake: daily dairy and cheese  Caffeine: tea  Other: no drug use    Objective     Vitals:  Vitals:    10/04/16 1315   BP: 130/78   Pulse: 76   Resp: 16   Temp: 36.4 ?C (97.6 ?F)   SpO2: 99%       Physical Examination:  Pleasant 61 y.o. female in NAD  HEENT:  Grossly normal  Thyroid:  No masses or thyromegaly, supple  Heart:  RRR without M/R/G  Lungs:  Normal inspiratory and expiratory effort, CTA bilaterally  Breasts: no palpable masses, no lymphadenopathy, no nipple discharge  Abdomen:  Soft, nontender, no masses. No rebound or guarding   Vulva: normal in appearance, no lesions  Vagina:  nl in appearance no discharge or lesions  Urethral meatus: nl in size and location,  no lesions, no prolapse  Cervix:  nl in appearance no lesions or discharge, no cervical motion tenderness,  Uterus:   non tender, mobile, nomasses,  Adnexae: no masses, non-tender,   Bladder: no masses, non-tender  Anus and perineum: nl  Extremities:  No cords, non-tender  Skin:  no lesions  Psych: good eye contact, appropriate  questions and affect, well groomed   Neurologic exam:  grossly intact with normal gait and strength     Assessment / Plan     Suraya Vidrine is a 61 y.o. female who presents for well woman exam.     Patient counseled and anticipatory guidance provided on the following wellness topics:  1. Ordering a mammogram  2. Pap sent with co-testing  3. Referral for colonoscopy.     Robina Ade, MD  10/04/2016 1:26 PM    Patient was seen by Dr. Noel Gerold, who agrees with the above assessment and plan.

## 2016-10-17 ENCOUNTER — Inpatient Hospital Stay: Admit: 2016-10-17

## 2016-10-17 DIAGNOSIS — Z1231 Encounter for screening mammogram for malignant neoplasm of breast: Secondary | ICD-10-CM

## 2016-10-20 ENCOUNTER — Inpatient Hospital Stay: Admit: 2016-10-20 | Discharge: 2016-11-01

## 2016-10-20 DIAGNOSIS — R928 Other abnormal and inconclusive findings on diagnostic imaging of breast: Secondary | ICD-10-CM

## 2016-10-27 NOTE — Telephone Encounter (Signed)
Pt called in to request a refill of: atorvastatin (LIPITOR) 20 MG tablet , carvedilol (COREG) 6.25 MG tablet           [facility]: [facility]  Callback number: [phone]

## 2016-10-27 NOTE — Telephone Encounter (Signed)
Patient is requesting a refill of atorvastatin (LIPITOR) 20 MG tablet , carvedilol (COREG) 6.25 MG tablet , last seen by [facility] on 10/04/16, and no FYI restrictions.  Routed pended order to Robina Ade, MD for review.     Future Appointments  Date Time Provider Department Center   11/15/2016 10:00 AM Gailen Shelter, MD [facility]        BP Readings from Last 3 Encounters:   10/04/16 130/78   09/06/16 128/80   05/31/16 138/74     Pulse Readings from Last 3 Encounters:   10/04/16 76   09/06/16 88   05/31/16 97       Last Labs:    LDL Calculated   Date Value Ref Range Status   09/06/2016 69 0 - 129 mg/dL Final     Triglyceride   Date Value Ref Range Status   09/06/2016 118.0 <=150.0 mg/dL Final     Sodium   Date Value Ref Range Status   04/11/2016 140 135 - 145 mmol/L Final     Potassium   Date Value Ref Range Status   04/11/2016 3.70 3.50 - 5.10 mmol/L Final     AST - Aspartate Aminotransferase   Date Value Ref Range Status   02/17/2016 25 8 - 39 IU/L Final     ALT - Alanine Aminotransferase   Date Value Ref Range Status   02/17/2016 20 7 - 52 IU/L Final     BUN   Date Value Ref Range Status   04/11/2016 11 8 - 20 mg/dL Final     No results found for: HGBA1C, MICROALBUR, MALB24HUR  Glomerular Filtration Rate Estimate   Date Value Ref Range Status   03/06/2016 >60.0 >=60.0 mL/min/1.12m*2 Final     Glomerular Filtration Rate Estimate (Female)   Date Value Ref Range Status   04/11/2016 >60.0 >=60.0 mL/min/1.36m*2 Final

## 2016-10-31 MED ORDER — atorvastatin (LIPITOR) 20 MG tablet
20 | ORAL_TABLET | Freq: Every evening | ORAL | 0 refills | Status: DC
Start: 2016-10-31 — End: 2017-04-03

## 2016-10-31 MED ORDER — carvedilol (COREG) 6.25 MG tablet
6.25 | ORAL_TABLET | Freq: Two times a day (BID) | ORAL | 0 refills | 90.00000 days | Status: DC
Start: 2016-10-31 — End: 2017-02-05

## 2016-11-15 ENCOUNTER — Encounter: Admit: 2016-11-16 | Discharge: 2016-11-16

## 2016-11-15 ENCOUNTER — Ambulatory Visit: Admit: 2016-11-15 | Discharge: 2016-11-15 | Attending: MD

## 2016-11-15 DIAGNOSIS — Z01818 Encounter for other preprocedural examination: Secondary | ICD-10-CM

## 2016-11-15 MED ORDER — sodium sulfate-potassium sulfate-magnesium sulfate (SUPREP BOWEL PREP KIT) solution
17.5-3.13-1.6 | ORAL | 0 refills | Status: DC
Start: 2016-11-15 — End: 2016-11-28

## 2016-11-15 NOTE — Telephone Encounter (Signed)
Pt called in wanting to know if she can take the rx that was prescribed today sodium sulfate-potassium sulfate-magnesium sulfate (SUPREP BOWEL PREP KIT) solution She states she allergic to sulfate and does not know if she should take it. Pt would like a call once we have an answer for her.

## 2016-11-15 NOTE — Progress Notes (Signed)
Chief Complaint   Patient presents with   . Consultation     Colon       Vitals:    11/15/16 1013   Weight: 235 lb 8 oz (106.8 kg)       Smoking Counseling given: Not Answered      Annual Screening:        Annual screening not completed at this visit .                 There are no preventive care reminders to display for this patient.

## 2016-11-15 NOTE — Procedures (Signed)
[facility] PRE-OP INSTRUCTIONS      Date of Surgery: Tuesday November 28, 2016    Arrive at the Surgery Entrance at: 10AM    Do not eat or drink anything after midnight the night prior to your surgery.  Do not chew gum or suck on hard candies after midnight.    *Please follow your surgeon's instructions about fluids if they differ from these.    If you develop a sore throat, fever or other illness please call your doctor.     Please arrange for an adult to drive you home and be with you for 24 hours after your surgery.    Before leaving for the hospital:    . Take a soapy shower or bath and brush teeth.  . Please do not wear eye make-up.  Marland Kitchen Please wear NO jewelry and remove all piercings.   . Please wear casual, non-restrictive clothing.  . Do not bring credit cards, cash, medication, jewelry, or other valuables.    Additional instructions; Dr.Herr will be your Anesthesiologist and would like you to take the following medications with a small sip of water prior to coming to the hospital Carvedilol.    Pre-op Clinic phone: [phone]  [facility] RN

## 2016-11-15 NOTE — Telephone Encounter (Signed)
The sulfa she is allergic to is different. Sulfa is short for sulphonamides which are a class of antibiotic. Sulfer, sulfates, sulfites are completely different chemicals from sulfonamides. There is a distinction between sulfa drugs and other sulfur-containing drugs and additives, such as sulfates and sulfites, which are chemically unrelated to the sulfonamide group, and do not cause the same hypersensitivity reactions seen with sulfonamides. Suprep will not cause a reaction like sulfonamides -it contains sulfates which are salts.  Let call her and tell her she will be fine.

## 2016-11-15 NOTE — Telephone Encounter (Signed)
Called and spoke to patient and per providers note informed her that the bowel prep kit would be fine to do. Pt states that she understands

## 2016-11-15 NOTE — Progress Notes (Signed)
CHIEF COMPLAINT:   Screening colonoscopy history and physical and  consultation.    [phone] (home)     PATIENT REFERRED BY: Robina Ade, MD     FACE-TO-FACE TIME:  Was 30 minutes.  Over 50% of the visit (20 minutes) was spent counseling patient in regard to the screening endoscopic procedure.    HISTORY OF PRESENT ILLNESS: screen, never had    CODE STATUS:  Full code.    ALLERGIES:  Allergies   Allergen Reactions   . Heparin Anaphylaxis     Possible HIT   . Sulfa (Sulfonamide Antibiotics) Rash     In ICU for 3 days post dose         ACTIVE MEDICAL PROBLEMS:   Patient Active Problem List   Diagnosis SNOMED CT(R)   . Demand ischemia (CMS/HCC) ACUTE ISCHEMIC HEART DISEASE   . Pleural effusion PLEURAL EFFUSION   . Obesity OBESITY   . Iron deficiency anemia IRON DEFICIENCY ANEMIA   . Weight loss WEIGHT LOSS   . Family history of cancer FAMILY HISTORY OF CANCER   . Hilar lymphadenopathy- bilataeral  HILAR LYMPHADENOPATHY   . Liver lesion- small hypodense lesion around falciform lig LESION OF LIVER   . Non-ischemic cardiomyopathy (CMS/HCC) CARDIOMYOPATHY   . 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 Aortic valve disorder AORTIC VALVE DISORDER   . 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 Screening for colon cancer SCREENING STATUS       SURGICAL HISTORY:  Past Surgical History:   Procedure Laterality Date   . AORTIC VALVE REPLACEMENT     . PROCEDURE N/A 01/27/2016    Procedure: AORTIC VALVE REPLACEMENT; INTRA AORTIC BALLOON INSERTION;  Surgeon: Otho Bellows, MD;  Location: [facility] OR;  Service: Cardiology;  Laterality: N/A;   . PROCEDURE Right 03/10/2016    Procedure: AMPUTATION TOE;  Surgeon: Chrystie Nose, DPM;  Location: [facility] OR;  Service: Podiatry;  Laterality: Right;   . TONSILLECTOMY     . TUBAL LIGATION  1980       ACTIVE MEDICATIONS:  Current Outpatient Prescriptions:   .  acetaminophen (TYLENOL) 650 MG CR tablet, Take 1 tablet by mouth  every 8 hours as needed for Pain., Disp: 30 tablet, Rfl: 0  .  aspirin 325 MG EC tablet, Take 325 mg by mouth daily., Disp: , Rfl:   .  atorvastatin (LIPITOR) 20 MG tablet, Take 1 tablet by mouth every night at bedtime., Disp: 90 tablet, Rfl: 0  .  carvedilol (COREG) 6.25 MG tablet, Take 1 tablet by mouth 2 times daily with meals., Disp: 180 tablet, Rfl: 0    FAMILY HISTORY:   Family History   Problem Relation Age of Onset   . Cancer Mother      colon cancer   . Heart disease Father    . Heart disease Sister    . Cancer Brother    . Cancer Maternal Aunt    . Heart disease Maternal Aunt    . Cancer Maternal Uncle    . Heart disease Maternal Uncle    . Breast cancer Paternal Aunt        SOCIAL HISTORY AND HABITS:    Social History     Social History   . Marital status: Married     Spouse name: N/A   . Number of children: N/A   . Years of education: N/A     Occupational History   .  Not on file.     Social History Main Topics   . Smoking status: Passive Smoke Exposure - Never Smoker   . Smokeless tobacco: Never Used   . Alcohol use No   . Drug use: No   . Sexual activity: Not Currently     Partners: Male     Other Topics Concern   . Not on file     Social History Narrative   . No narrative on file       REVIEW OF SYSTEMS:  A complete and comprehensive ROS is negative, or per the HPI, or as follows; ROS: Pertinent items are noted in HPI.    PHYSICAL EXAMINATION:General: alert, no distress  Abdomen: Soft without significant tenderness, disention, masses, organomegaly or guarding  Soft, NT, ND, no HSM, no masses  Extremities: Pink, perfused with no edema    ASSESSMENT:  Screening colonoscopy history and physical and consult.    PLAN:  PARQ provided for colonoscopy.  The risks, benefits, alternatives and procedure were described.  A written handout was provided and verbal counseling ensued.  We described the chances of a complication occurring secondary to the procedural sedation and analgesia or the procedure itself (see  chart below for breakdown).  PSAA complications include hypotension, arrhythmia induction, anaphylactic response, allergic reaction or respiratory suppression, or depression.  These complications could result in assisted ventilation or intubation, hospital admission for observation for arrhythmia resolution, or for arrhythmia reversal, fluid resuscitation or medical intervention for anaphylaxis.  Other complications that could occur secondary to the procedure are hemorrhage or perforation (1:2-3,000 for colonoscopy and 1:5,000 for EGD) secondary to scope trauma or biopsy trauma or splenic injury (fracture, bleeding necessitating surgical repair or removal).  In a worse case scenario perforation could lead to peritonitis and death, though this was noted to be a rare event.  These complications could necessitate hospital admission for observation, antibiotics, blood transfusion, or consultation with a surgeon for corrective measures.    The patient understands the risks and benefits of the procedure and would like to go ahead with colonoscopy.  Bowel prep was reviewed with the patient and the patient is scheduled.    06/1998  for all colonoscopies  06/1248 for all colonoscopies with polypectomy  06/2498 for all colonoscopies withOUT polypectomy  06/1248 for all colonoscopies with polypectomy  06/3331 for all colonoscopies for screening purposes  1/769 for all colonoscopies with any symptoms    For EGD: range of rate of complications is 1:2,500 to 1:11,000

## 2016-11-15 NOTE — Patient Instructions (Addendum)
Instructions to patients getting colonoscopy with SUPREP prep      Preparation #2: Two day prep. Do at 6 pm and 4 hours before arrival time to hospital    - Be sure to let your doctor know if you are taking coumadin or warfarin or require antibiotics prior to invasive procedures.    - Fill the prescription for SUPREP. This is the laxative that will clear out your colon. Use it exactly as described below.      1)  The day before your procedure, (On Monday June 18th   ) eat and drink only clear liquids (any liquid you can see light through), no solid food or liquids such as milk.  Strained fruit juices without pulp  Water  Clear broth or bouillon  Coffee, tea, (no milk, creamer, or cream)  Soft drinks, Gatorade  Jello, popsicles  Kool-Aid, or other fruit flavored drinks    2)  At ( 6 am   ) , the day before your procedure  (On Monday June 18th    ), complete the following steps.    1) Pour one 6 oz bottle of Suprep into the cup given with the kit    2) Fill the cup to the 16 oz line near the top with water (cool or cold water)    3) Drink ALL the liquid    4) You must then drink two (2) more 16oz containers of water over the next one (1) hour.    5) Do the same prep again at ( 5 am  ) on (Tuesday June 19th       )    3)   No food or drink within 2 hours of your procedure.    4) Arrange for a ride home from the hospital. Arrive at the hospital on time.    5) Show at [facility] (across from [facility]) on Tuesday June 19th at 1030 am      Colonoscopy  A colonoscopy is an exam to look at the entire large intestine (colon). This exam can help find problems such as tumors, polyps, inflammation, and areas of bleeding. The exam takes about 1 hour.   LET [facility] CARE PROVIDER KNOW ABOUT:   ? Any allergies you have.  ? All medicines you are taking, including vitamins, herbs, eye drops, creams, and over-the-counter medicines.  ? Previous problems you or members of your family have  had with the use of anesthetics.  ? Any blood disorders you have.  ? Previous surgeries you have had.  ? Medical conditions you have.  RISKS AND COMPLICATIONS   Generally, this is a safe procedure. However, as with any procedure, complications can occur. Possible complications include:  ? Bleeding.  ? Tearing or rupture of the colon wall.  ? Reaction to medicines given during the exam.  ? Infection (rare).  BEFORE THE PROCEDURE   ? Ask your health care provider about changing or stopping your regular medicines.  ? You may be prescribed an oral bowel prep. This involves drinking a large amount of medicated liquid, starting the day before your procedure. The liquid will cause you to have multiple loose stools until your stool is almost clear or light green. This cleans out your colon in preparation for the procedure.  ? Do not eat or drink anything else once you have started the bowel prep, unless your health care provider tells you it is safe to do so.  ?  Arrange for someone to drive you home after the procedure.  PROCEDURE   ? You will be given medicine to help you relax (sedative).  ? You will lie on your side with your knees bent.  ? A long, flexible tube with a light and camera on the end (colonoscope) will be inserted through the rectum and into the colon. The camera sends video back to a computer screen as it moves through the colon. The colonoscope also releases carbon dioxide gas to inflate the colon. This helps your health care provider see the area better.  ? During the exam, your health care provider may take a small tissue sample (biopsy) to be examined under a microscope if any abnormalities are found.  ? The exam is finished when the entire colon has been viewed.  AFTER THE PROCEDURE   ? Do not drive for 24 hours after the exam.  ? You may have a small amount of blood in your stool.  ? You may pass moderate amounts of gas and have mild abdominal cramping or bloating. This is caused by the gas used to  inflate your colon during the exam.  ? Ask when your test results will be ready and how you will get your results. Make sure you get your test results.     This information is not intended to replace advice given to you by your health care provider. Make sure you discuss any questions you have with your health care provider.     Document Released: 05/26/2000 Document Revised: 09/20/2015 Document Reviewed: 02/03/2013  Elsevier Interactive Patient Education ?2017 [employer]

## 2016-11-16 ENCOUNTER — Ambulatory Visit: Admit: 2016-11-17 | Discharge: 2016-11-17

## 2016-11-16 DIAGNOSIS — S39012A Strain of muscle, fascia and tendon of lower back, initial encounter: Secondary | ICD-10-CM

## 2016-11-16 NOTE — Progress Notes (Signed)
Subjective     HPI:  Brandy [name] is a 61 y.o. female with h/o AS s/p AVR, valvular cardiomyopathy here with Acute low back pain. She was leaning into a freezer and had sudden onset of right lower lumbar back pain. The pain is to the right just off the midline. It does not radiate. It is worse with standing up straight and some other movements. Unable to identify alleviating factors. No numbness or tingling of the lower extremity. No saddle anesthesia or bladder or bowel incontinence. No fevers or chills. The pain does not wake her from night. No other recent history of trauma to the back.    ROS:  See hpi    History:  Problem list, past medical history, family history, and social history were reviewed and updated where appropriate.    Objective     Vitals:  BP: 122/84 / Pulse: 82 / SpO2: 93 % (RA) / Temp: 36.8 ?C (98.2 ?F) / Resp: 16    Physical Examination:  Physical Exam   Constitutional: She is well-developed, well-nourished, and in no distress. No distress.   HENT:   Mouth/Throat: Oropharynx is clear and moist.   Eyes: Conjunctivae and EOM are normal.   Cardiovascular: Normal rate.    No murmur heard.  Pulmonary/Chest: Effort normal. She has no wheezes. She has no rales.   Musculoskeletal:   R-side upper lumbar paraspinal tenderness to palpation over a focal area  No spinous process tenderness  Strength full in proximal and distal lower extremities  Walking with a stooped gait   Neurological:   Sensation full in the lower extremities  Patella reflex 2+ b/l   Skin: She is not diaphoretic.     Assessment / Plan       SNOMED CT(R)    1. Strain of muscle, fascia and tendon of lower back, initial encounter  LOW BACK STRAIN cyclobenzaprine (FLEXERIL) 5 MG tablet     Brandy [name] is a 61 y.o. female here with acute low back pain consistent with muscle strain. The pain is worse with trunk extension although without other symptoms and extremely low suspicion for central canal stenosis. No neuropathic  symptoms suggestive of radiculopathy. Discussed home exercises, potential benefit from physical therapy, over-the-counter analgesia with Tylenol, topical treatments, and heat. She has tried much of this without any benefit. I will add on Flexeril 5 mg 3 times a day when necessary. Discussed the risks of this medicine and that it is not to be used prior to operating a vehicle as well as its sedating effects. She will stop it if she has any adverse effects or if it is not effective. Follow-up in one month if no improvement. At that time repeat an exam and consider imaging.    Brandy Ade, MD  11/16/2016 6:03 PM    Patient was discussed with Dr. Jeanella Craze, who agrees with the above assessment and plan.

## 2016-11-16 NOTE — Progress Notes (Signed)
Sashay was discussed and reviewed with resident at the time of visit    My findings concur with resident's evaluation/plan of care and documentation including the following. Please see their note for details.    HPI: Brandy [name]  is a 61 y.o.  Here for   Chief Complaint   Patient presents with   . Back Pain     radiating to hips-patient thinks its a pinched nerve x 1 week         Back pain x1 week - right lower.  Started with a bending and twisting motion.  No radiation, worse when standing straight.  No red flag symptoms.  No trauma history.    See resident exam - no bony tenderness, paraspinal tenderness on the right.    ASSESSMENT AND PLAN:  Right low back strain - Tylenol, heat, topical analgesics, Flexeril p.r.n.  The patient declined home exercises and PT.     PIECAR:FOXS                      Jon Gills, MD  D:  11/16/2016 20:30:53            T:  11/20/2016 10:23:07            V/D:  [id number]              E:  /                                    EXAM:  BP 122/84 (BP Location: Left arm, Patient Position: Sitting)   Pulse 82   Temp 36.8 ?C (98.2 ?F) (Temporal)   Resp 16   Wt 233 lb (105.7 kg)   LMP  (LMP Unknown)   SpO2 93% Comment: RA  BMI 40.63 kg/m?   See resident exam

## 2016-11-16 NOTE — Progress Notes (Signed)
Chief Complaint   Patient presents with   . Back Pain     radiating to hips-patient thinks its a pinched nerve x 1 week        Vitals:    11/16/16 1746   BP: 122/84   BP Location: Left arm   Patient Position: Sitting   Pulse: 82   Resp: 16   Temp: 36.8 ?C (98.2 ?F)   TempSrc: Temporal   SpO2: 93%   Weight: 233 lb (105.7 kg)       Smoking Counseling given: Not Answered      Annual Screening:        Annual screening not completed at this visit .                 There are no preventive care reminders to display for this patient.

## 2016-11-17 MED ORDER — cyclobenzaprine (FLEXERIL) 5 MG tablet
5 | ORAL_TABLET | Freq: Three times a day (TID) | ORAL | 0 refills | 30.00000 days | Status: DC | PRN
Start: 2016-11-17 — End: 2018-03-08

## 2016-11-22 NOTE — Telephone Encounter (Signed)
Per Dr Arita Miss: Pt states one of her medications isn't covered and she can't afford. Please help inquire. She isn't sure which one. She thinks it might be atorvastatin or coreg but both are on the formulary

## 2016-11-22 NOTE — Telephone Encounter (Signed)
I called and left a message for the patient regarding the below note.

## 2016-11-22 NOTE — Telephone Encounter (Signed)
Patient called back and was not home during time of call. She is not sure which medication is not covered. She believes it is Coreg or Lipitor. She uses [facility].     According to [facility] all medications were picked up and appeared to have been covered by insurance. She states there are refills on the medications. [facility] had to dispense 40 mg tablets of Lipitor last fill because they were out of 20 mg. Patient was instructed to take a half of a tablet. I will let patient know and have her call if she has any issues at next fill.

## 2016-11-22 NOTE — Telephone Encounter (Signed)
I attempted to call patient in regards to Dr Pace's note. No answer, message left.

## 2016-11-23 IMAGING — CT CT ABDOMEN AND PELVIS WITH CONTRAST
2 of 3 series · 15 of 46 positions shown, 17 images · IV contrast (ISOVUE 300)
Comparison: There are no previous exams available for comparison.

CT ABDOMEN AND PELVIS WITH CONTRAST, 11/23/2016 [DATE]: 
CLINICAL INDICATION:  15 pound weight loss over the last year. Abdominal pain 
and nausea. Current smoker for over 20 years. Previous appendectomy and 
cholecystectomy. 
A search for DICOM formatted images was conducted for prior CT imaging studies 
completed at a non-affiliated media free facility.
TECHNIQUE: The abdomen and pelvis were scanned from lung bases through the 
pubic rami with 100 cc's of Isovue 300 injected intravenously on a 
high-resolution Ct scanner using dose reduction techniques.  Routine MPR 
reconstructions were performed.

[Series 4: abd/pel ax w · axial · 0.75mm/px · z∈[-444,-21]mm · 12 of 163 slices shown, 14 images]
[im 11/163  soft-tissue]
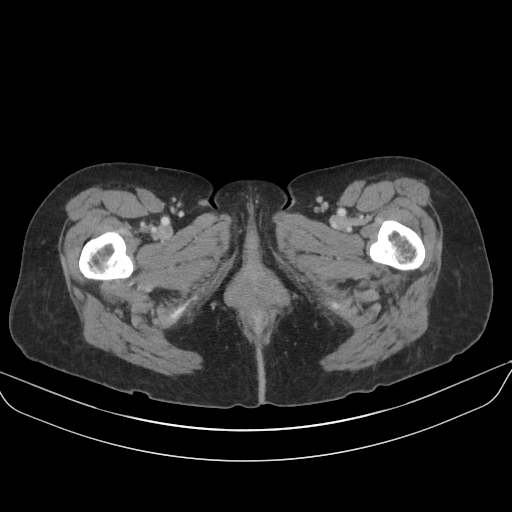
[im 11/163  bone]
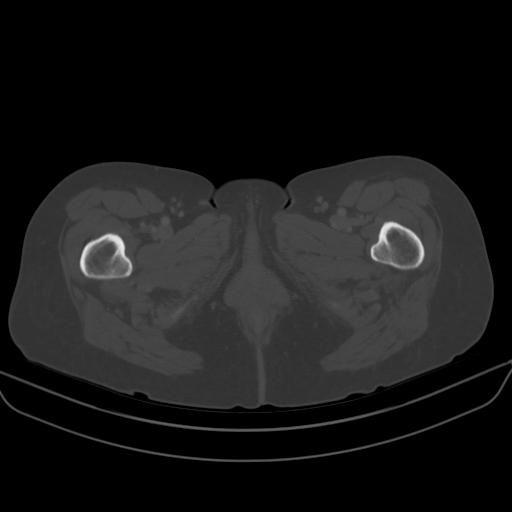
[im 21/163  soft-tissue]
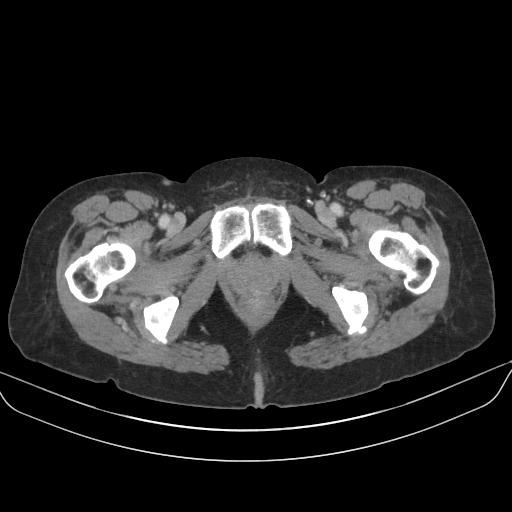
[im 37/163  soft-tissue]
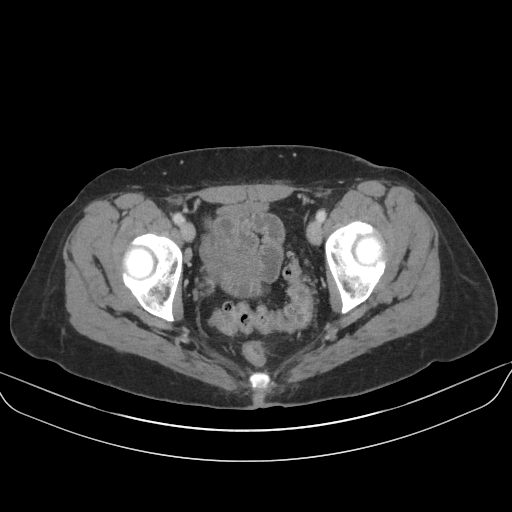
[im 48/163  soft-tissue]
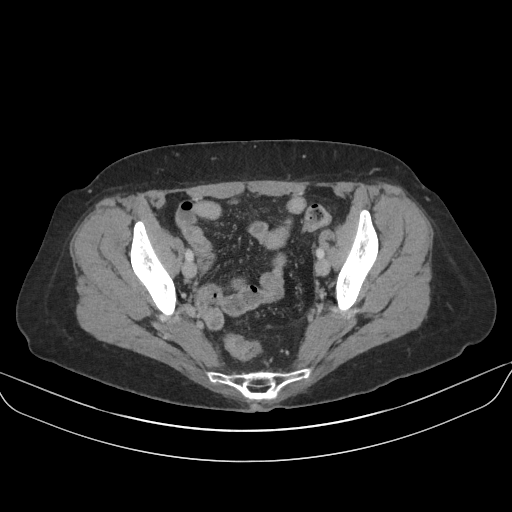
[im 63/163  soft-tissue]
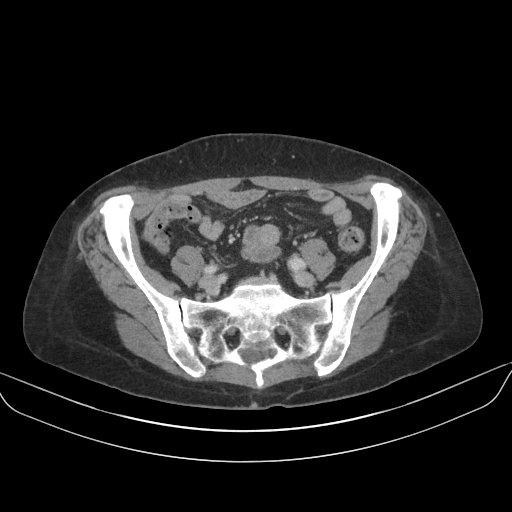
[im 74/163  soft-tissue]
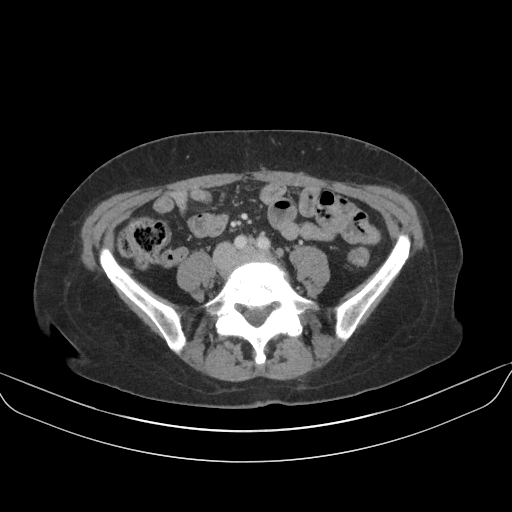
[im 89/163  soft-tissue]
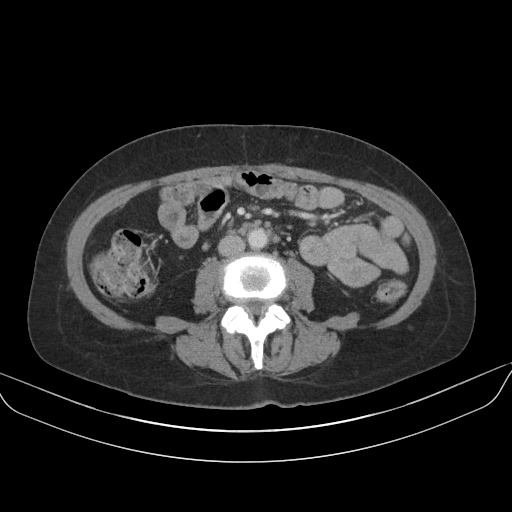
[im 100/163  soft-tissue]
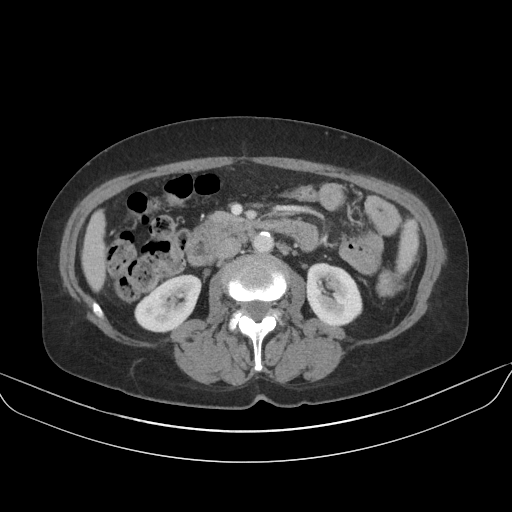
[im 115/163  soft-tissue]
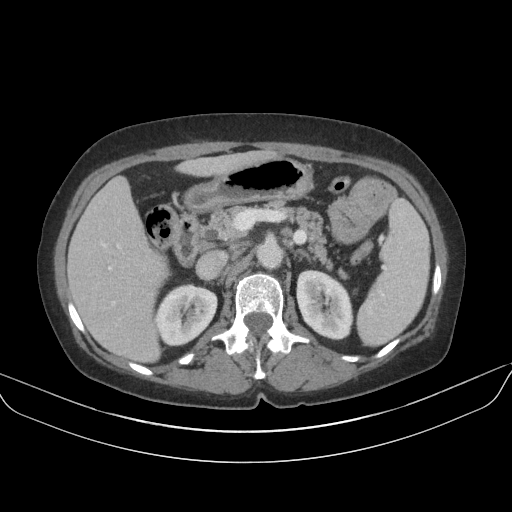
[im 115/163  bone]
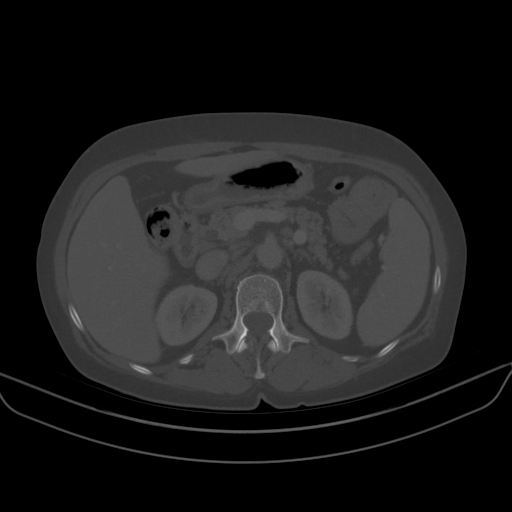
[im 126/163  soft-tissue]
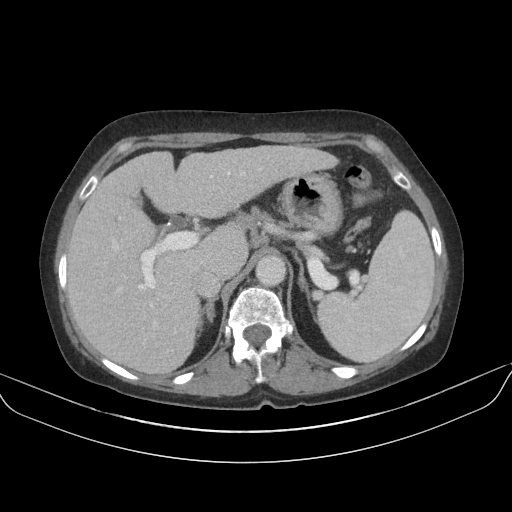
[im 142/163  soft-tissue]
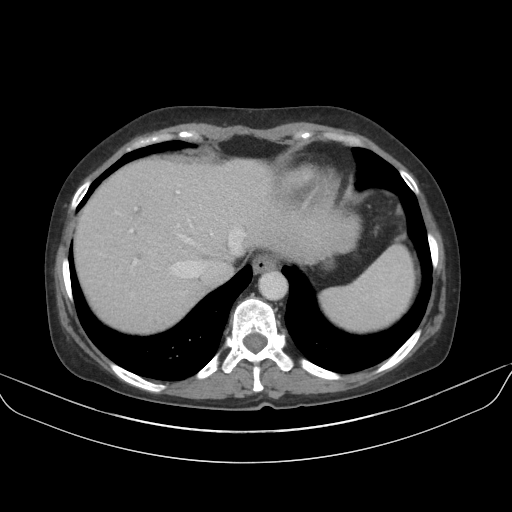
[im 152/163  soft-tissue]
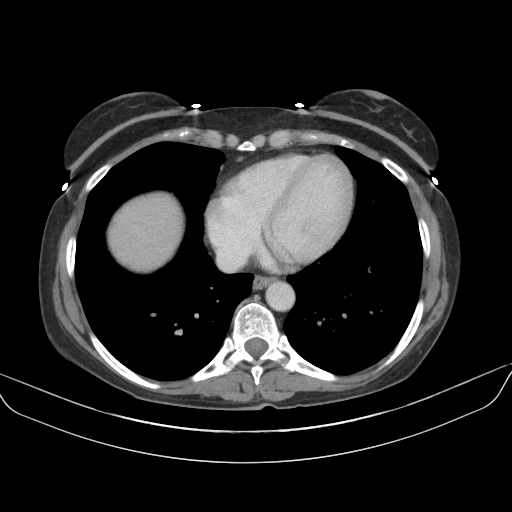

[Series 5: abd/pel cor w · coronal · 0.69mm/px · 3 of 151 slices shown]
[im 51/151  soft-tissue]
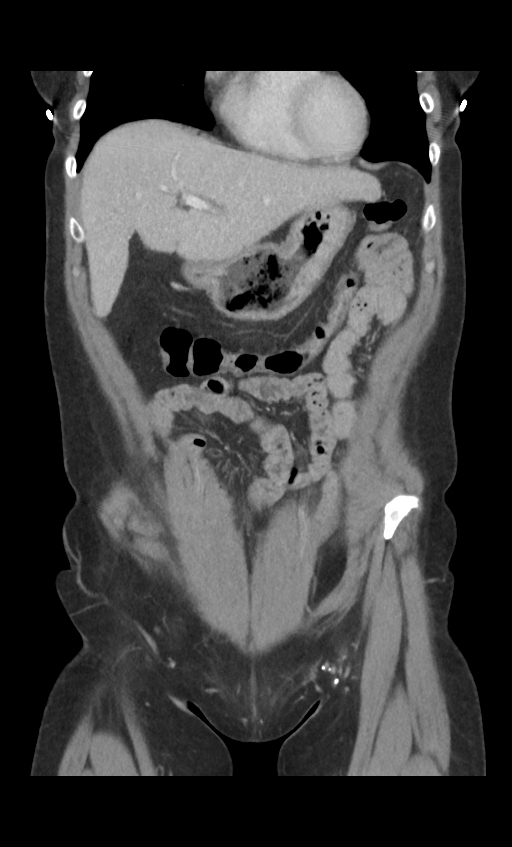
[im 67/151  soft-tissue]
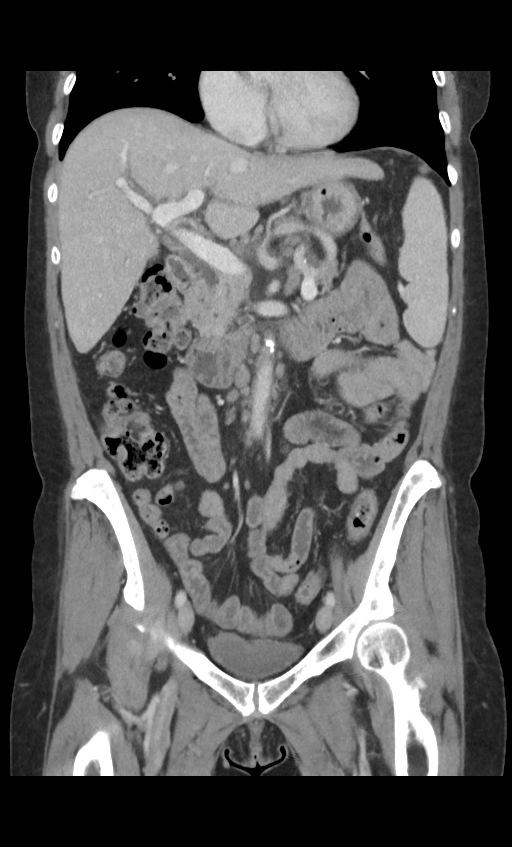
[im 84/151  soft-tissue]
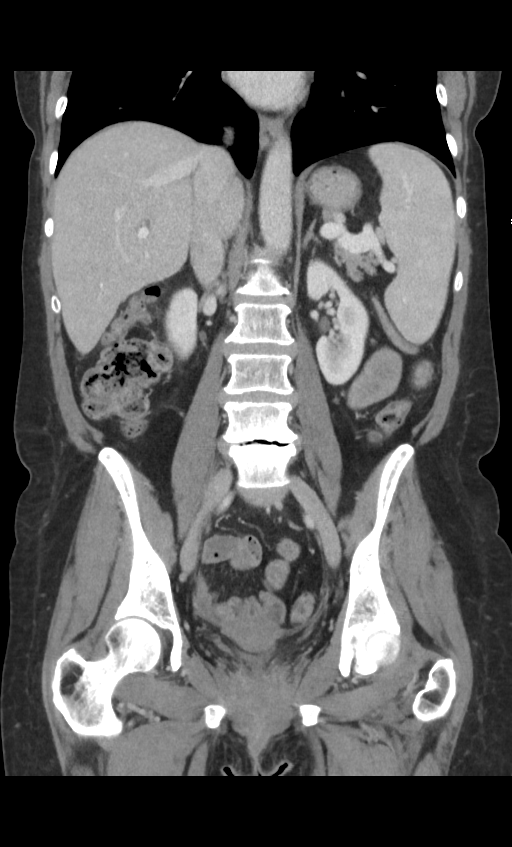

[15 of 46 positions shown; findings below may reference images not displayed]

FINDINGS: The lung bases are clear. The spleen is mildly prominent at 12.5 cm 
in length on the coronal view. Patient's had a previous cholecystectomy. The 
liver, pancreas, adrenal glands and kidneys are normal in appearance. There 
are 
atherosclerotic changes and there are degenerative changes. There are some 
shotty retroperitoneal lymph nodes primarily in the aortocaval region 
significance uncertain. Largest is on image 76 at 11 x 7 mm. I do not see 
significant adenopathy in the pelvis. No free fluid. The bladder is 
decompressed. No suspicious osseous lesion seen.
IMPRESSION: Mild splenomegaly and shotty retroperitoneal lymph nodes. Significance 
uncertain. No suspicious mass or inflammatory changes seen elsewhere. 
Atherosclerotic changes and degenerative changes. 
RADIATION DOSE REDUCTION: All CT scans are performed using radiation dose 
reduction techniques, when applicable.  Technical factors are evaluated and 
adjusted to ensure appropriate moderation of exposure.  Automated dose 
management technology is applied to adjust the radiation doses to minimize 
exposure while achieving diagnostic quality images.

## 2016-11-23 NOTE — Telephone Encounter (Signed)
Fax came into the clinic from [facility].  They are not covering the medication cyclboenzaprine 5 mg.  This is not an approved medication on their formulary.

## 2016-11-27 NOTE — H&P (Signed)
CHIEF COMPLAINT:   Screening colonoscopy history and physical and  consultation.  ?  [phone] (home)   ?  PATIENT REFERRED BY: Robina Ade, MD   ?  FACE-TO-FACE TIME:  Was 30 minutes.  Over 50% of the visit (20 minutes) was spent counseling patient in regard to the screening endoscopic procedure.  ?  HISTORY OF PRESENT ILLNESS: screen, never had  ?  CODE STATUS:  Full code.  ?  ALLERGIES:        Allergies   Allergen Reactions   . Heparin Anaphylaxis   ? ? Possible HIT   . Sulfa (Sulfonamide Antibiotics) Rash   ? ? In ICU for 3 days post dose  ?   ?  ?  ACTIVE MEDICAL PROBLEMS:        Patient Active Problem List   Diagnosis SNOMED CT(R)   . Demand ischemia (CMS/HCC) ACUTE ISCHEMIC HEART DISEASE   . Pleural effusion PLEURAL EFFUSION   . Obesity OBESITY   . Iron deficiency anemia IRON DEFICIENCY ANEMIA   . Weight loss WEIGHT LOSS   . Family history of cancer FAMILY HISTORY OF CANCER   . Hilar lymphadenopathy- bilataeral  HILAR LYMPHADENOPATHY   . Liver lesion- small hypodense lesion around falciform lig LESION OF LIVER   . Non-ischemic cardiomyopathy (CMS/HCC) CARDIOMYOPATHY   . 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 Aortic valve disorder AORTIC VALVE DISORDER   . 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 Screening for colon cancer SCREENING STATUS   ?  ?  SURGICAL HISTORY:        Past Surgical History:   Procedure Laterality Date   . AORTIC VALVE REPLACEMENT ? ?   . PROCEDURE N/A 01/27/2016   ? Procedure: AORTIC VALVE REPLACEMENT; INTRA AORTIC BALLOON INSERTION;  Surgeon: Otho Bellows, MD;  Location: [facility] OR;  Service: Cardiology;  Laterality: N/A;   . PROCEDURE Right 03/10/2016   ? Procedure: AMPUTATION TOE;  Surgeon: Chrystie Nose, DPM;  Location: [facility] OR;  Service: Podiatry;  Laterality: Right;   . TONSILLECTOMY ? ?   . TUBAL LIGATION ? 1980   ?  ?  ACTIVE MEDICATIONS:  Current Outpatient Prescriptions:   .  acetaminophen (TYLENOL)  650 MG CR tablet, Take 1 tablet by mouth every 8 hours as needed for Pain., Disp: 30 tablet, Rfl: 0  .  aspirin 325 MG EC tablet, Take 325 mg by mouth daily., Disp: , Rfl:   .  atorvastatin (LIPITOR) 20 MG tablet, Take 1 tablet by mouth every night at bedtime., Disp: 90 tablet, Rfl: 0  .  carvedilol (COREG) 6.25 MG tablet, Take 1 tablet by mouth 2 times daily with meals., Disp: 180 tablet, Rfl: 0  ?  FAMILY HISTORY:          Family History   Problem Relation Age of Onset   . Cancer Mother ?   ? ? colon cancer   . Heart disease Father ?   Marland Kitchen Heart disease Sister ?   Marland Kitchen Cancer Brother ?   Marland Kitchen Cancer Maternal Aunt ?   Marland Kitchen Heart disease Maternal Aunt ?   Marland Kitchen Cancer Maternal Uncle ?   Marland Kitchen Heart disease Maternal Uncle ?   Marland Kitchen Breast cancer Paternal Aunt ?   ?  ?  SOCIAL HISTORY AND HABITS:    Social?History   Social History   ?        Social History   .  Marital status: Married   ? ? Spouse name: N/A   . Number of children: N/A   . Years of education: N/A   ?      Occupational History   . Not on file.   ?        Social History Main Topics   . Smoking status: Passive Smoke Exposure - Never Smoker   . Smokeless tobacco: Never Used   . Alcohol use No   . Drug use: No   . Sexual activity: Not Currently   ? ? Partners: Male   ?       Other Topics Concern   . Not on file   ?      Social History Narrative   . No narrative on file      ?  ?  REVIEW OF SYSTEMS:  A complete and comprehensive ROS is negative, or per the HPI, or as follows; ROS: Pertinent items are noted in HPI.  ?  PHYSICAL EXAMINATION:General: alert, no distress  Abdomen: Soft without significant tenderness, disention, masses, organomegaly or guarding  Soft, NT, ND, no HSM, no masses  Extremities: Pink, perfused with no edema  ?  ASSESSMENT:  Screening colonoscopy history and physical and consult.  ?  PLAN:  PARQ provided for colonoscopy.  The risks, benefits, alternatives and procedure were described.  A written handout was provided and verbal counseling ensued.  We described  the chances of a complication occurring secondary to the procedural sedation and analgesia or the procedure itself (see chart below for breakdown).  PSAA complications include hypotension, arrhythmia induction, anaphylactic response, allergic reaction or respiratory suppression, or depression.  These complications could result in assisted ventilation or intubation, hospital admission for observation for arrhythmia resolution, or for arrhythmia reversal, fluid resuscitation or medical intervention for anaphylaxis.  Other complications that could occur secondary to the procedure are hemorrhage or perforation (1:2-3,000 for colonoscopy and 1:5,000 for EGD) secondary to scope trauma or biopsy trauma or splenic injury (fracture, bleeding necessitating surgical repair or removal).  In a worse case scenario perforation could lead to peritonitis and death, though this was noted to be a rare event.  These complications could necessitate hospital admission for observation, antibiotics, blood transfusion, or consultation with a surgeon for corrective measures.  ?  The patient understands the risks and benefits of the procedure and would like to go ahead with colonoscopy.  Bowel prep was reviewed with the patient and the patient is scheduled.  ?  06/1998  for all colonoscopies  06/1248 for all colonoscopies with polypectomy  06/2498 for all colonoscopies withOUT polypectomy  06/1248 for all colonoscopies with polypectomy  06/3331 for all colonoscopies for screening purposes  1/769 for all colonoscopies with any symptoms  ?  For EGD: range of rate of complications is 1:2,500 to 1:11,000  ?

## 2016-11-28 LAB — TISSUE EXAM

## 2016-11-28 MED ORDER — propofol (DIPRIVAN) injection
10 | INTRAVENOUS | Status: DC | PRN
Start: 2016-11-28 — End: 2016-11-28
  Administered 2016-11-28: 18:00:00 10 mg/mL via INTRAVENOUS

## 2016-11-28 MED ORDER — propofol (DIPRIVAN) 10 mg/mL injection
10 | INTRAVENOUS | Status: AC
Start: 2016-11-28 — End: ?

## 2016-11-28 MED ORDER — sodium chloride 0.9 % infusion
INTRAVENOUS | Status: DC
Start: 2016-11-28 — End: 2016-11-28
  Administered 2016-11-28: 18:00:00 via INTRAVENOUS

## 2016-11-28 NOTE — Discharge Summary (Signed)
See op note

## 2016-11-28 NOTE — Op Note (Addendum)
DATE OF PROCEDURE:  11/28/16      PREOPERATIVE DIAGNOSIS: Screening Colonoscopy    POSTOPERATIVE DIAGNOSIS:   1) Colonoscopy with 3/4 circumference apple core lesion at full insertion of the colonoscope but likely distal to the Hepatic flexure  2) Hemorrhoids: mild internal  3) Mass: hot forceps biopsied times two without complications and tattooed times two proximal to the mass    PROCEDURE PERFORMED: Colonoscopy.    ENDOSCOPIST: Dr. Gailen Shelter    ASSISTANT: None    INSTRUMENT USED: High Definition Olympus colonoscope.    ANESTHESIA: Propofol, under supervision of anesthesiologist - see separate note.    EXTENT OF EXAM: ?Hepatic flexure?    PREPARATION: Adequate but solid matter present (corn and vegetables)    LIMITATIONS: None.    COMPLICATIONS: none    BLOOD LOSS: <1 cc    PROCEDURE IN DETAIL: PARQ and a physical examination was performed in clinic. All questions answered and H and P updated prior to the procedure. The major risks and benefits associated with the procedure were explained to the patient in detail. The patient verbalized understanding and agreement with the same. A time out was performed. The patient was connected to the appropriate monitoring devices and an IV was started. Continuous oxygen was provided via nasal cannula and intravenous sedation (propofol) was administered by anesthesia, who was present throughout the procedure.    After adequate sedation was achieved, the patient was placed in the left lateral decubitus position and a digital rectal exam was performed. Examination was normal and there were no masses noted. A well-lubricated colonoscope was then inserted into the rectum and advanced under direct visualization to the level of the hepatic flexure by visual landmarks but I had scope fully inserted.  Upon insertion of the scope the following was found: a 3/4 circumference apple core lesion at full insertion of the colonoscope but likely distal to the Hepatic flexure, this mass  was hot forceps biopsied times two without complications and tattooed times two proximal to the mass.    The scope was then fully withdrawn while examining the color, texture, anatomy and integrity of the mucosa to the anal canal. See time print for withdrawal time length (generally a minimum of 7 minutes).  The findings were consistent with normal colonic mucosa.  Retroflexion of the scope was NOT done in the rectum.  Any remaining fluid and air was then removed and the scope removed.  Patient tolerated the procedure well and was then transferred to the recovery room in stable condition.    Plan:  1) today no driving   2) resume a regular diet  3) referral to surgery    RECOMMENDATIONS and FOLLOW UP:     Outpatient Final Progress Note   Final diagnoses: see above operative note   Condition: Good. Stable condition.   Disposition: Patient will be discharged to home. Warning signs and home plans are discussed.   Follow-up care: see above operative note

## 2016-11-28 NOTE — Anesthesia Post-Procedure Evaluation (Signed)
Patient Name: Brandy [name]  Procedures performed: Procedure(s) with comments:  COLONOSCOPY; BIOPSY - ink spot marking apple core mass proximal lesion distal to hepatic flexure    Last Vitals:        11/28/16 1008   BP: (!) 149/96          11/28/16 1008   Pulse: 104          11/28/16 1008   Resp: 18          11/28/16 1008   SpO2: 97%          11/28/16 1008   Temp: 36.2 ?C       Planned Anesthesia Type: general  Final Anesthesia Type: general  Patients Current Location: PACU  Level of Consciousness: awake  planned opioid use   Post Procedure Pain:adequate analgesia  Airway: Patent  Respiratory Status: nasal cannula and spontaneous ventilation  Cardio Status: hemodynamically stable  Hydration: adequately hydrated  PONV prophylaxis Ordered  PONV? NO  no anesthesia complication,       The patient was able to participate in the post op evaluation    Comments:      Jearld Adjutant Henrine Hayter  11:16 AM

## 2016-11-28 NOTE — OR Nursing (Signed)
PT INTERVIEWED BY T Heavin Sebree, RN. ALLERGIES, NPO STATUS AND PROCEDURE REVIEWED WITH PT. PT TO ENDO ROOM. MONITORS AND O2 APPLIED. PT POSITIONED SELF ON LEFT SIDE FOR PROCEDURE.

## 2016-11-28 NOTE — Anesthesia Pre-Procedure Evaluation (Signed)
Anesthesia Evaluation     Patient summary reviewed and Nursing notes reviewed    No history of anesthetic complications     Airway   Mallampati: I  Dental - normal exam     Pulmonary - negative ROS and normal exam    breath sounds clear to auscultation  Cardiovascular - normal exam  (+) CAD, CHF,     Rhythm: regular  Rate: normal    Neuro/Psych      GI/Hepatic/Renal - negative ROS     Endo/Other - negative ROS    Musculoskeletal - negative ROS                   Anesthesia Plan    ASA 2     general     intravenous induction   Anesthetic Plan, Alternatives, Risks and Questions discussed with patient.    Pre-Anesthesia Evaluation Completed at:  11/28/2016 10:33 AM

## 2016-11-28 NOTE — OR Nursing (Signed)
PT TOLERATED PROCEDURE WELL BACK TO DSU WITHOUT INCIDENT. ACCOMPANIED BY ANES AND RN.

## 2016-11-28 NOTE — OR Nursing (Signed)
1245 Dr. Georgeanna Lea at bedside to review results with patient and husband. Discharge instructions reviewed with patient and family.

## 2016-11-28 NOTE — Discharge Instructions (Signed)
Patient Education     Findings:  POSTOPERATIVE DIAGNOSIS 11/28/16 colonoscopy:   1) Colonoscopy with full circumference apple core lesion at full insertion of the colonoscopy but likely proximal to the Hepatic flexure  2) Hemorrhoids: mild internal  3) Mass: hot forceps biopsied times two without complications and tattooed times two proximal to the mass    Plan for today:  1) no driving  2) resume a regular diet  3) I will refer to surgery    Colonoscopy, Care After  Refer to this sheet in the next few weeks. These instructions provide you with information on caring for yourself after your procedure. Your health care provider may also give you more specific instructions. Your treatment has been planned according to current medical practices, but problems sometimes occur. Call your health care provider if you have any problems or questions after your procedure.  WHAT TO EXPECT AFTER THE PROCEDURE   After your procedure, it is typical to have the following:  ? A small amount of blood in your stool.  ? Moderate amounts of gas and mild abdominal cramping or bloating.  HOME CARE INSTRUCTIONS  ? Do not drive, operate machinery, or sign important documents for 24 hours.  ? You may shower and resume your regular physical activities, but move at a slower pace for the first 24 hours.  ? Take frequent rest periods for the first 24 hours.  ? Walk around or put a warm pack on your abdomen to help reduce abdominal cramping and bloating.  ? Drink enough fluids to keep your urine clear or pale yellow.  ? You may resume your normal diet as instructed by your health care provider. Avoid heavy or fried foods that are hard to digest.  ? Avoid drinking alcohol for 24 hours or as instructed by your health care provider.  ? Only take over-the-counter or prescription medicines as directed by your health care provider.  ? If a tissue sample (biopsy) was taken during your procedure:    Do not take aspirin or blood thinners for 7 days, or as  instructed by your health care provider.    Do not drink alcohol for 7 days, or as instructed by your health care provider.    Eat soft foods for the first 24 hours.  SEEK MEDICAL CARE IF:  You have persistent spotting of blood in your stool 2-3 days after the procedure.  SEEK IMMEDIATE MEDICAL CARE IF:  ? You have more than a small spotting of blood in your stool.  ? You pass large blood clots in your stool.  ? Your abdomen is swollen (distended).  ? You have nausea or vomiting.  ? You have a fever.  ? You have increasing abdominal pain that is not relieved with medicine.     This information is not intended to replace advice given to you by your health care provider. Make sure you discuss any questions you have with your health care provider.     Document Released: 01/11/2004 Document Revised: 03/19/2013 Document Reviewed: 02/03/2013  Elsevier Interactive Patient Education ?2017 [employer]

## 2016-12-01 NOTE — Telephone Encounter (Signed)
POSTOPERATIVE DIAGNOSIS 11/28/16 colonoscopy:   1) Colonoscopy with 3/4 circumference apple core lesion at full insertion of the colonoscope but likely distal to the Hepatic flexure  2) Hemorrhoids: mild internal  3) Mass: hot forceps biopsied times two without complications and tattooed times two proximal to the mass    Biopsy suspicious for intramucosal adenocarcinoma.  Referral to Dr Oran Rein done and she has an appointment [phone]

## 2016-12-04 NOTE — Progress Notes (Signed)
Patient has already been scheduled and will be seen by Dr. Oran Rein this Wednesday in clinic.

## 2016-12-06 ENCOUNTER — Other Ambulatory Visit: Admit: 2016-12-06

## 2016-12-06 ENCOUNTER — Ambulatory Visit: Admit: 2016-12-06 | Discharge: 2016-12-06 | Attending: MD

## 2016-12-06 DIAGNOSIS — D374 Neoplasm of uncertain behavior of colon: Secondary | ICD-10-CM

## 2016-12-06 DIAGNOSIS — C183 Malignant neoplasm of hepatic flexure: Secondary | ICD-10-CM

## 2016-12-06 LAB — CREATININE
Creatinine: 0.7 mg/dL (ref 0.60–1.30)
Glomerular Filtration Rate Estimate (Female): >60 mL/min/{1.73_m2} (ref >=60.0)

## 2016-12-06 LAB — BUN: BUN: 15 mg/dL (ref 8–20)

## 2016-12-06 MED ORDER — sodium sulfate-potassium sulfate-magnesium sulfate (SUPREP BOWEL PREP KIT) solution
17.5-3.13-1.6 | Freq: Once | ORAL | 0 refills | Status: AC
Start: 2016-12-06 — End: 2016-12-07

## 2016-12-06 NOTE — Progress Notes (Signed)
Brandy [name] is a 61 y.o. female new patient being seen for colonic mass.     Notes from Dr. Georgeanna Lea:  POSTOPERATIVE DIAGNOSIS 11/28/16 colonoscopy:   1) Colonoscopy with 3/4 circumference apple core lesion at full insertion of the colonoscope but likely distal to the Hepatic flexure   2) Hemorrhoids: mild internal   3) Mass: hot forceps biopsied times two without complications and tattooed times two proximal to the mass     Biopsy suspicious for intramucosal adenocarcinoma. RM

## 2016-12-06 NOTE — H&P (View-Only) (Signed)
HISTORY OF PRESENT ILLNESS:    Brandy [name] is a 61 y.o. female referred by Gailen Shelter, MD for surgical evaluation after identifying a circumferential fungating tumor at the proximal colon on colonoscopy. Patient denies any recent unintentional weight loss, fevers, or chills. She has occasional constipation but denies any nausea or vomiting or symptoms of obstruction. She has had no visible blood in her stool and reports no abdominal or rectal pain. The patient is a nonsmoker. Patient reports that her mother had been diagnosed with colon cancer at a late age.      MEDICAL / SURGICAL HISTORY:  Past Medical History:   Diagnosis Date   . CHF (congestive heart failure) (CMS/HCC)    . History of blood transfusion    . 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 AORTIC VALVE REPLACEMENT     . CARDIAC SURGERY  01/23/2016    Valve replacement   . PROCEDURE N/A 01/27/2016    Procedure: AORTIC VALVE REPLACEMENT; INTRA AORTIC BALLOON INSERTION;  Surgeon: Otho Bellows, MD;  Location: [facility] OR;  Service: Cardiology;  Laterality: N/A;   . PROCEDURE Right 03/10/2016    Procedure: AMPUTATION TOE;  Surgeon: Chrystie Nose, DPM;  Location: [facility] OR;  Service: Podiatry;  Laterality: Right;   . PROCEDURE N/A 11/28/2016    Procedure: COLONOSCOPY; BIOPSY;  Surgeon: Gailen Shelter, MD;  Location: [facility] OR;  Service: [facility] Procedures;  Laterality: N/A;  ink spot marking apple core mass proximal lesion distal to hepatic flexure   . TONSILLECTOMY     . TUBAL LIGATION  1980        CURRENT MEDS:  Current Outpatient Prescriptions   Medication Sig Dispense Refill   . acetaminophen (TYLENOL) 650 MG CR tablet Take 1 tablet by mouth every 8 hours as needed for Pain. 30 tablet 0   . aspirin 325 MG EC tablet Take 325 mg by mouth daily.     Marland Kitchen atorvastatin (LIPITOR) 20 MG tablet Take 1 tablet by mouth every night at bedtime. 90 tablet 0   . carvedilol (COREG) 6.25 MG tablet  Take 1 tablet by mouth 2 times daily with meals. 180 tablet 0   . cyclobenzaprine (FLEXERIL) 5 MG tablet Take 1 tablet by mouth 3 times daily as needed for Muscle spasms. 30 tablet 0     No current facility-administered medications for this visit.         ALLERGIES:  Heparin and Sulfa (sulfonamide antibiotics)      Family History   Problem Relation Age of Onset   . Cancer Mother         colon cancer   . Heart disease Father    . Heart disease Sister    . Cancer Brother    . Cancer Maternal Aunt    . Heart disease Maternal Aunt    . Cancer Maternal Uncle    . Heart disease Maternal Uncle    . Breast cancer Paternal Aunt         Social History     Social History Main Topics   . Smoking status: Passive Smoke Exposure - Never Smoker   . Smokeless tobacco: Never Used   . Alcohol use No   . Drug use: No   . Sexual activity: Not Currently     Partners: Male     Social History   . Marital status: Married  Spouse name: N/A   . Number of children: N/A   . Years of education: N/A     Other Topics Concern   . Not on file        ROS  All systems reviewed, pertinent positives and negatives discussed in HPI.        PHYSICAL EXAM  Vitals:    12/06/16 1500   BP: (!) 142/94   BP Location: Left arm   Patient Position: Sitting   Pulse: 93   Resp: 16   Temp: 37 ?C (98.6 ?F)   TempSrc: Temporal   SpO2: 95%   Weight: 238 lb 3.2 oz (108 kg)   Height: 5' 3 (1.6 m)      Body mass index is 42.2 kg/m?Marland Kitchen    General: no acute distress, alert and oriented x3   HEENT: mucous membranes moist, no scleral icterus   Heme/Lymphatic: No cervical lymphadenopathy.  Heart: regular rate  Lungs: clear bilaterally  Abdomen: soft, non tender, non distended  Musculoskeletal/extremities: well perfused bilaterally, no edema  Neurologic: no focal deficits.    Pathology:    Final Pathologic Diagnosis   A. Colon, apple core mass, biopsy:  Suspicious for intramucosal adenocarcinoma (see comment).     Operative note by Dr. Georgeanna Lea (11/28/2016) identifies a 3/4  circumference apple core lesion at the hepatic flexure which was biopsied.     Biopsy in this case demonstrates adenomatous dysplasia with regional areas of glandular complexity in which glands demonstrate early fusion with each other and cytologic features suggestive of high-grade dysplasia. ?These findings are suspicious for intramucosal adenocarcinoma. ?The biopsy, however, is small and the features are somewhat subjective. ?Given the clinical findings, the case will be sent to the [facility] for a second/confirmatory opinion for which results will be reported as an addendum.      ASSESSMENT & PLAN      ICD-10-CM    1. 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 This had the gross appearance of a circumferential colon cancer and should be treated as such until proven otherwise. CT imaging will be obtained for further characterization and assessment for metastases. I explained the anticipated operation of a right hemicolectomy. The inherent risks, benefits, alternatives, and utility of performing this operation were discussed with the patient including bleeding, infection, cancer recurrence, anastomotic leak, and need for additional surgery or procedures. The patient expressed a clear understanding after all questions were answered. We'll schedule the operation and review of CT images once available. Thank you for allowing me to participate in the care of this patient.

## 2016-12-06 NOTE — Progress Notes (Signed)
HISTORY OF PRESENT ILLNESS:    Brandy [name] is a 60 y.o. female referred by Robert Jackman, MD for surgical evaluation after identifying a circumferential fungating tumor at the proximal colon on colonoscopy. Patient denies any recent unintentional weight loss, fevers, or chills. She has occasional constipation but denies any nausea or vomiting or symptoms of obstruction. She has had no visible blood in her stool and reports no abdominal or rectal pain. The patient is a nonsmoker. Patient reports that her mother had been diagnosed with colon cancer at a late age.      MEDICAL / SURGICAL HISTORY:  Past Medical History:   Diagnosis Date   . CHF (congestive heart failure) (CMS/HCC)    . History of blood transfusion    . 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 AORTIC VALVE REPLACEMENT     . CARDIAC SURGERY  01/23/2016    Valve replacement   . PROCEDURE N/A 01/27/2016    Procedure: AORTIC VALVE REPLACEMENT; INTRA AORTIC BALLOON INSERTION;  Surgeon: Nicholas Engstrom, MD;  Location: [facility] OR;  Service: Cardiology;  Laterality: N/A;   . PROCEDURE Right 03/10/2016    Procedure: AMPUTATION TOE;  Surgeon: Evan Chard Merrill, DPM;  Location: [facility] OR;  Service: Podiatry;  Laterality: Right;   . PROCEDURE N/A 11/28/2016    Procedure: COLONOSCOPY; BIOPSY;  Surgeon: Robert Jackman, MD;  Location: [facility] OR;  Service: [facility] Procedures;  Laterality: N/A;  ink spot marking apple core mass proximal lesion distal to hepatic flexure   . TONSILLECTOMY     . TUBAL LIGATION  1980        CURRENT MEDS:  Current Outpatient Prescriptions   Medication Sig Dispense Refill   . acetaminophen (TYLENOL) 650 MG CR tablet Take 1 tablet by mouth every 8 hours as needed for Pain. 30 tablet 0   . aspirin 325 MG EC tablet Take 325 mg by mouth daily.     . atorvastatin (LIPITOR) 20 MG tablet Take 1 tablet by mouth every night at bedtime. 90 tablet 0   . carvedilol (COREG) 6.25 MG tablet  Take 1 tablet by mouth 2 times daily with meals. 180 tablet 0   . cyclobenzaprine (FLEXERIL) 5 MG tablet Take 1 tablet by mouth 3 times daily as needed for Muscle spasms. 30 tablet 0     No current facility-administered medications for this visit.         ALLERGIES:  Heparin and Sulfa (sulfonamide antibiotics)      Family History   Problem Relation Age of Onset   . Cancer Mother         colon cancer   . Heart disease Father    . Heart disease Sister    . Cancer Brother    . Cancer Maternal Aunt    . Heart disease Maternal Aunt    . Cancer Maternal Uncle    . Heart disease Maternal Uncle    . Breast cancer Paternal Aunt         Social History     Social History Main Topics   . Smoking status: Passive Smoke Exposure - Never Smoker   . Smokeless tobacco: Never Used   . Alcohol use No   . Drug use: No   . Sexual activity: Not Currently     Partners: Male     Social History   . Marital status: Married       Spouse name: N/A   . Number of children: N/A   . Years of education: N/A     Other Topics Concern   . Not on file        ROS  All systems reviewed, pertinent positives and negatives discussed in HPI.        PHYSICAL EXAM  Vitals:    12/06/16 1500   BP: (!) 142/94   BP Location: Left arm   Patient Position: Sitting   Pulse: 93   Resp: 16   Temp: 37 ?C (98.6 ?F)   TempSrc: Temporal   SpO2: 95%   Weight: 238 lb 3.2 oz (108 kg)   Height: 5' 3 (1.6 m)      Body mass index is 42.2 kg/m?.    General: no acute distress, alert and oriented x3   HEENT: mucous membranes moist, no scleral icterus   Heme/Lymphatic: No cervical lymphadenopathy.  Heart: regular rate  Lungs: clear bilaterally  Abdomen: soft, non tender, non distended  Musculoskeletal/extremities: well perfused bilaterally, no edema  Neurologic: no focal deficits.    Pathology:    Final Pathologic Diagnosis   A. Colon, apple core mass, biopsy:  Suspicious for intramucosal adenocarcinoma (see comment).     Operative note by Dr. Jackman (11/28/2016) identifies a 3/4  circumference apple core lesion at the hepatic flexure which was biopsied.     Biopsy in this case demonstrates adenomatous dysplasia with regional areas of glandular complexity in which glands demonstrate early fusion with each other and cytologic features suggestive of high-grade dysplasia. ?These findings are suspicious for intramucosal adenocarcinoma. ?The biopsy, however, is small and the features are somewhat subjective. ?Given the clinical findings, the case will be sent to the [facility] for a second/confirmatory opinion for which results will be reported as an addendum.      ASSESSMENT & PLAN      ICD-10-CM    1. 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 This had the gross appearance of a circumferential colon cancer and should be treated as such until proven otherwise. CT imaging will be obtained for further characterization and assessment for metastases. I explained the anticipated operation of a right hemicolectomy. The inherent risks, benefits, alternatives, and utility of performing this operation were discussed with the patient including bleeding, infection, cancer recurrence, anastomotic leak, and need for additional surgery or procedures. The patient expressed a clear understanding after all questions were answered. We'll schedule the operation and review of CT images once available. Thank you for allowing me to participate in the care of this patient.

## 2016-12-14 DIAGNOSIS — C183 Malignant neoplasm of hepatic flexure: Secondary | ICD-10-CM

## 2016-12-15 ENCOUNTER — Inpatient Hospital Stay: Admit: 2016-12-15 | Discharge: 2016-12-18 | Attending: MD

## 2016-12-15 MED ORDER — iopamidol (ISOVUE-300) 61 % injection 100 mL
300 | Freq: Once | INTRAVENOUS | Status: AC
Start: 2016-12-15 — End: 2016-12-14
  Administered 2016-12-15: 01:00:00 300 mL via INTRAVENOUS

## 2016-12-19 ENCOUNTER — Encounter

## 2016-12-19 NOTE — Telephone Encounter (Signed)
Left message for patient to call back for results. Brandy [name]

## 2016-12-19 NOTE — Telephone Encounter (Signed)
Left message for patient stating that she can take heart or blood pressure medication the day of procedure, with a small sip of water. Other than that, if she can hold off on other medications until after surgery, that would be best. Reminded her that aspirin and NSAIDS should be held for 5-7 days prior to surgery. RM

## 2016-12-19 NOTE — Telephone Encounter (Signed)
MESSAGE FROM VM 12/19/16 2:27 PM: Patient stated that she missed a call about results and will wait for [place] to call back.

## 2016-12-19 NOTE — Telephone Encounter (Signed)
MESSAGE FROM VM 12/19/16 1:35 PM: Patient left a message stating that she missed a call and would like a call back.

## 2016-12-19 NOTE — Telephone Encounter (Signed)
Patient called asking if she is ok to take all her pills the day of her procedure except for Asprin. Patient would like a call back.

## 2016-12-19 NOTE — Telephone Encounter (Signed)
-----   Message from Truitt Merle, MD sent at 12/18/2016  9:25 PM PDT -----  Please contact patient and inform her of the CT results particularly that there is a small pulmonary nodule that requires follow-up CT scan in 3-6 months for surveillance.  The colon mass was identified on CT and we will proceed as planned for partial colectomy.  Thanks. -j

## 2016-12-20 ENCOUNTER — Ambulatory Visit: Admit: 2016-12-20 | Discharge: 2016-12-20

## 2016-12-20 DIAGNOSIS — Z01810 Encounter for preprocedural cardiovascular examination: Secondary | ICD-10-CM

## 2016-12-20 LAB — CBC WITHOUT DIFFERENTIAL
HCT: 28.7 % — ABNORMAL LOW (ref 35.0–48.0)
Hemoglobin: 8.9 g/dL — ABNORMAL LOW (ref 11.7–16.5)
MCH: 22.7 pg — ABNORMAL LOW (ref 28.3–33.3)
MCHC: 31.2 g/dL — ABNORMAL LOW (ref 32.5–36.0)
MCV: 72.8 fL — ABNORMAL LOW (ref 81.0–100.0)
MPV: 7.5 fL (ref 6.9–10.0)
Platelet Count: 366 10*3/ÂµL (ref 150–405)
RBC: 3.94 10*6/ÂµL (ref 3.80–5.60)
RDW: 17.4 % — ABNORMAL HIGH (ref 11.7–16.1)
WBC: 6.6 10*3/ÂµL (ref 4.5–11.0)

## 2016-12-20 LAB — COMPREHENSIVE METABOLIC PANEL
ALT - Alanine Aminotransferase: 13 IU/L (ref 5–33)
AST - Aspartate Aminotransferase: 15 IU/L (ref 5–32)
Albumin/Globulin Ratio: 1.1 — ABNORMAL LOW (ref 1.2–2.2)
Albumin: 3.8 g/dL (ref 3.5–5.0)
Alkaline Phosphatase: 131 IU/L — ABNORMAL HIGH (ref 35–105)
Anion Gap: 14.5 mmol/L (ref 12.0–20.0)
BUN / Creatinine Ratio: 21.6 — ABNORMAL HIGH (ref 12.0–20.0)
BUN: 19 mg/dL (ref 8–20)
Bilirubin Total: <0.2 mg/dL (ref 0.10–1.70)
CO2 - Carbon Dioxide: 24 mmol/L (ref 17–27)
Calcium: 10.5 mg/dL (ref 8.6–10.6)
Chloride: 108.9 mmol/L (ref 101.0–111.0)
Creatinine: 0.88 mg/dL (ref 0.60–1.30)
Glomerular Filtration Rate Estimate (Female): >60 mL/min/{1.73_m2} (ref >=60.0)
Glucose: 89 mg/dL (ref 74–106)
Osmolality Calculation: 286.7 mOsm/kg (ref 275.0–300.0)
Potassium: 4.42 mmol/L (ref 3.50–5.10)
Protein Total: 7.2 g/dL (ref 6.4–8.2)
Sodium: 143 mmol/L (ref 135–145)

## 2016-12-20 NOTE — Discharge Instructions (Signed)
[facility] PRE-OP INSTRUCTIONS    Date of Surgery: 12/28/2016    Arrive at the Surgery Entrance at:     Your surgeon's office will contact you with your arrival time the working day prior to your procedure.    Do not eat or drink anything after midnight the night prior to your surgery.  Do not chew gum, tobacco or suck on hard candies after midnight.    *Please follow your surgeon's instructions about fluids if they differ from these.    Do not smoke the day of your surgery, if you smoke. Please limit your smoking in the days leading up to your surgery. [facility] is a tobacco free campus.    If you develop a sore throat, fever or other illness please call your doctor.    If you are going home the same day as surgery, please arrange for an adult to drive you home and be with you for 24 hours after your surgery.    Before leaving for the hospital:    . Take a soapy shower or bath and brush teeth.  . Please do not wear eye make-up.  Marland Kitchen Please wear NO jewelry and remove all piercings.   . Please wear casual, non-restrictive clothing.  . Do not bring credit cards, cash, medication, jewelry, or other valuables.    What To Bring With You --  If you will be staying at least one night     ? please have your family bring your overnight bag once you reach your room on the post-surgical unit.    ? Please bring your glasses, hearing aids and a case for these items.    ? Children are encouraged to bring their favorite blanket or small toy from home.    ? Please bring the following to the hospital: Comfortable loose fitting shirt and shorts     Additional instructions    Your Anesthesiologist is, Dr. Kristopher Glee  and he would like you to take these medications the morning of surgery with a small amount of water: Carvedilol      * Do not take any Vitamins or Herbs _3_ Days prior to Surgery    * Do not take any anti-inflammatories such as  -- Ibuprofen, Motrin, Aleve, Naproxen, Aspirin, Excedrin _7_ Days prior to   Surgery    If you have elected to use [facility] pharmacy for discharge medications, your co-pay should be the same as your home pharmacy.     Pre-op Clinic phone: [phone]    Herbert Seta, RN

## 2016-12-20 NOTE — Telephone Encounter (Signed)
MESSAGE FROM VM 12/20/16 4:09 PM: Patient left a message saying she missed a call and would like a call back.

## 2016-12-20 NOTE — Progress Notes (Signed)
EKG COMPLETED BY Calix Heinbaugh 12/20/2016

## 2016-12-20 NOTE — Telephone Encounter (Signed)
Left another message for patient to call back. RM

## 2016-12-20 NOTE — Progress Notes (Signed)
HOME MEDICATION LIST REVIEWED BY PHARMACIST        Prior To Admission Home Medication list in Epic was prepared during this inpatient visit by a team member other than a pharmacist in a best attempt to document medications that the patient takes in the outpatient setting:      Home Medications     Med List Status:  Pharmacist Review Complete Set By: Mariane Baumgarten at 12/19/2016  4:15 PM                acetaminophen (TYLENOL) 650 MG CR tablet     Take 1 tablet by mouth every 8 hours as needed for Pain.     aspirin 325 MG EC tablet     Take 325 mg by mouth every morning.      atorvastatin (LIPITOR) 20 MG tablet     Take 1 tablet by mouth every night at bedtime.     carvedilol (COREG) 6.25 MG tablet     Take 1 tablet by mouth 2 times daily with meals.     cyclobenzaprine (FLEXERIL) 5 MG tablet     Take 1 tablet by mouth 3 times daily as needed for Muscle spasms.     Patient taking differently:  Take 5 mg by mouth daily as needed for Muscle spasms.           The pharmacist has reviewed the list, without direct patient communication, and has not identified any concerns that are anticipated to cause harm in a typical patient.      Inaccuracies may still exist, and the home medication list has not been clinically evaluated for appropriate indications; please use clinical judgment when reconciling the list.    Signed by: Mariane Baumgarten, Pharmacy Resident

## 2016-12-20 NOTE — Telephone Encounter (Signed)
Heather from Pre-Op called to let us know that patient's H&H came back at 8.9 and 28. They believe that patient should have a type & screen done on the day of surgery. I have put in an order for this.     Dr. Oran Rein - would you like anything else to be done? RM

## 2016-12-21 NOTE — Telephone Encounter (Signed)
I called patient and gave her the results below. She said she understood and confirmed her surgery for next week.    In regards to the pulmonary nodule, she asked if that could be removed at the same time next week. I let her know that we do not operate on that part of the body and that Dr. Oran Rein is not recommending that the nodule be removed at this time. She said that she does not want to worry and wonder about it in the future.     Dr. Oran Rein - do you have anything else to add? RM

## 2016-12-25 NOTE — Telephone Encounter (Signed)
Pt called and said she missed a call from [place]. I let her know that I would see if that was our CMA and have her call back.

## 2016-12-28 ENCOUNTER — Inpatient Hospital Stay
Admit: 2016-12-28 | Discharge: 2016-12-31 | Disposition: A | Discharge status: Home / Self Care | Source: Ambulatory Visit | Attending: MD | Admitting: MD

## 2016-12-28 DIAGNOSIS — C183 Malignant neoplasm of hepatic flexure: Principal | ICD-10-CM

## 2016-12-28 LAB — ABO/RH (HCLL): Rh (D): POSITIVE

## 2016-12-28 LAB — TISSUE EXAM

## 2016-12-28 LAB — HEMOGLOBIN AND HEMATOCRIT, BLOOD
HCT: 31.7 % — ABNORMAL LOW (ref 35.0–48.0)
Hemoglobin: 10.1 g/dL — ABNORMAL LOW (ref 11.7–16.5)

## 2016-12-28 LAB — ANTIBODY SCREEN (HCLL): Antibody Screen: NEGATIVE

## 2016-12-28 MED ORDER — EPINEPHrine (ADRENALIN) 1 mg/mL injection
1 | INTRAMUSCULAR | Status: DC | PRN
Start: 2016-12-28 — End: 2016-12-28
  Administered 2016-12-28: 23:00:00 1 mg/mL via SUBCUTANEOUS

## 2016-12-28 MED ORDER — ondansetron (ZOFRAN) 4 mg/2 mL injection
4 | INTRAMUSCULAR | Status: AC
Start: 2016-12-28 — End: ?

## 2016-12-28 MED ORDER — fentaNYL (PF) (SUBLIMAZE) injection
50 | INTRAMUSCULAR | Status: DC | PRN
Start: 2016-12-28 — End: 2016-12-28
  Administered 2016-12-28 (×2): 50 mcg/mL via INTRAVENOUS

## 2016-12-28 MED ORDER — EPINEPHrine (ADRENALIN) 1 mg/mL injection
1 | INTRAMUSCULAR | Status: AC
Start: 2016-12-28 — End: ?

## 2016-12-28 MED ORDER — HYDROmorphone (DILAUDID) 0.5 mg/0.5 mL injection
0.5 | INTRAMUSCULAR | Status: AC
Start: 2016-12-28 — End: ?

## 2016-12-28 MED ORDER — glycopyrrolate (ROBINUL) injection
0.2 | INTRAMUSCULAR | Status: DC | PRN
Start: 2016-12-28 — End: 2016-12-28
  Administered 2016-12-28: 22:00:00 0.2 mg/mL via INTRAVENOUS

## 2016-12-28 MED ORDER — propofol (DIPRIVAN) injection
10 | INTRAVENOUS | Status: DC | PRN
Start: 2016-12-28 — End: 2016-12-28
  Administered 2016-12-28 (×3): 10 mg/mL via INTRAVENOUS

## 2016-12-28 MED ORDER — ondansetron (ZOFRAN) injection
4 | INTRAMUSCULAR | Status: DC | PRN
Start: 2016-12-28 — End: 2016-12-28
  Administered 2016-12-28: 21:00:00 4 mg/2 mL via INTRAVENOUS

## 2016-12-28 MED ORDER — meperidine (PF) (DEMEROL) injection 12.5 mg
50 | INTRAMUSCULAR | Status: DC | PRN
Start: 2016-12-28 — End: 2016-12-28

## 2016-12-28 MED ORDER — glycopyrrolate (PF) in water (ROBINUL) 1 mg/5 mL (0.2 mg/mL) syringe
1 | INTRAVENOUS | Status: AC
Start: 2016-12-28 — End: ?

## 2016-12-28 MED ORDER — ondansetron (ZOFRAN) injection 4 mg
4 | INTRAMUSCULAR | Status: DC | PRN
Start: 2016-12-28 — End: 2016-12-28
  Administered 2016-12-28: 4 mg via INTRAVENOUS

## 2016-12-28 MED ORDER — ceFAZolin (ANCEF) in D5W IVPB Premix
2 | INTRAVENOUS | Status: DC | PRN
Start: 2016-12-28 — End: 2016-12-28
  Administered 2016-12-28: 19:00:00 2 gram/50 mL via INTRAVENOUS

## 2016-12-28 MED ORDER — phenylephrine (NEO-SYNEPHRINE) injection
10 | INTRAMUSCULAR | Status: DC | PRN
Start: 2016-12-28 — End: 2016-12-28
  Administered 2016-12-28 (×4): 10 mg/mL via INTRAVENOUS

## 2016-12-28 MED ORDER — ePHEDrine sulfate in NS (PF) 5 mg/mL injection 5-10 mg
5 | INTRAVENOUS | Status: DC | PRN
Start: 2016-12-28 — End: 2016-12-28

## 2016-12-28 MED ORDER — glycopyrrolate (PF) in water (ROBINUL) 1 mg/5 mL (0.2 mg/mL) syringe 0.2 mg
1 | INTRAVENOUS | Status: DC | PRN
Start: 2016-12-28 — End: 2016-12-28

## 2016-12-28 MED ORDER — neostigmine (PROSTIGMINE) injection
3 | INTRAVENOUS | Status: DC | PRN
Start: 2016-12-28 — End: 2016-12-28
  Administered 2016-12-28: 22:00:00 3 mg/ mL (1 mg/mL) via INTRAVENOUS

## 2016-12-28 MED ORDER — succinylcholine (ANECTINE) injection
20 | INTRAMUSCULAR | Status: DC | PRN
Start: 2016-12-28 — End: 2016-12-28
  Administered 2016-12-28: 19:00:00 20 mg/mL via INTRAVENOUS

## 2016-12-28 MED ORDER — dexamethasone sodium phos (PF) (DECADRON) 10 mg/mL injection
10 | INTRAMUSCULAR | Status: AC
Start: 2016-12-28 — End: ?

## 2016-12-28 MED ORDER — propofol (DIPRIVAN) 10 mg/mL injection
10 | INTRAVENOUS | Status: AC
Start: 2016-12-28 — End: ?

## 2016-12-28 MED ORDER — HYDROmorphone (DILAUDID) syringe
1 | INTRAMUSCULAR | Status: DC | PRN
Start: 2016-12-28 — End: 2016-12-28
  Administered 2016-12-28 (×2): 1 mg/mL via INTRAVENOUS

## 2016-12-28 MED ORDER — fentaNYL (PF) (SUBLIMAZE) 50 mcg/mL injection
50 | INTRAMUSCULAR | Status: AC
Start: 2016-12-28 — End: ?

## 2016-12-28 MED ORDER — ceFAZolin (ANCEF) 2 g in D5W IVPB Premix
2 | INTRAVENOUS | Status: AC | PRN
Start: 2016-12-28 — End: 2016-12-28

## 2016-12-28 MED ORDER — famotidine (PEPCID) tablet 20 mg
20 | Freq: Once | ORAL | Status: AC
Start: 2016-12-28 — End: 2016-12-28
  Administered 2016-12-28: 18:00:00 20 mg via ORAL

## 2016-12-28 MED ORDER — diphenhydrAMINE (BENADRYL) injection 12.5-25 mg
50 | INTRAMUSCULAR | Status: DC | PRN
Start: 2016-12-28 — End: 2016-12-28

## 2016-12-28 MED ORDER — midazolam (PF) (VERSED) injection 0.5-2 mg
1 | INTRAMUSCULAR | Status: DC | PRN
Start: 2016-12-28 — End: 2016-12-28
  Administered 2016-12-28: 19:00:00 1 mg via INTRAVENOUS

## 2016-12-28 MED ORDER — sodium chloride 0.9 % infusion
INTRAVENOUS | Status: DC
Start: 2016-12-28 — End: 2016-12-28
  Administered 2016-12-28 – 2016-12-29 (×3): via INTRAVENOUS

## 2016-12-28 MED ORDER — fentaNYL (PF) (SUBLIMAZE) injection 25-50 mcg
50 | INTRAMUSCULAR | Status: DC | PRN
Start: 2016-12-28 — End: 2016-12-28
  Administered 2016-12-28 (×5): 50 ug via INTRAVENOUS

## 2016-12-28 MED ORDER — metroNIDAZOLE (FLAGYL) 500 mg in 0.9% NaCl IVPB Premix
500 | INTRAVENOUS | Status: AC | PRN
Start: 2016-12-28 — End: 2016-12-28
  Administered 2016-12-28: 18:00:00 500 mg via INTRAVENOUS
  Administered 2016-12-29: 01:00:00 500100 mg via INTRAVENOUS

## 2016-12-28 MED ORDER — hydrALAZINE (APRESOLINE) injection 10 mg
20 | INTRAMUSCULAR | Status: DC | PRN
Start: 2016-12-28 — End: 2016-12-28

## 2016-12-28 MED ORDER — phenylephrine HCl in 0.9% NaCl (NEO-SYNEPHRINE) 1 mg/10 mL (100 mcg/mL) injection syringe
1 | INTRAVENOUS | Status: AC
Start: 2016-12-28 — End: ?

## 2016-12-28 MED ORDER — gabapentin (NEURONTIN) capsule 300 mg
300 | Freq: Once | ORAL | Status: AC
Start: 2016-12-28 — End: 2016-12-28
  Administered 2016-12-28: 18:00:00 300 mg via ORAL

## 2016-12-28 MED ORDER — morphine (PF) injection 2-4 mg
10 | INTRAVENOUS | Status: DC | PRN
Start: 2016-12-28 — End: 2016-12-28

## 2016-12-28 MED ORDER — rocuronium (ZEMURON) 50 mg/5 mL (10 mg/mL) injection
50 | INTRAVENOUS | Status: AC
Start: 2016-12-28 — End: ?

## 2016-12-28 MED ORDER — oxyCODONE (OxyCONTIN) 12 hr tablet 10 mg
10 | Freq: Once | ORAL | Status: AC
Start: 2016-12-28 — End: 2016-12-28
  Administered 2016-12-28: 18:00:00 10 mg via ORAL

## 2016-12-28 MED ORDER — acetaminophen (TYLENOL) tablet 1,000 mg
500 | Freq: Once | ORAL | Status: AC
Start: 2016-12-28 — End: 2016-12-28
  Administered 2016-12-28: 18:00:00 500 mg via ORAL

## 2016-12-28 MED ORDER — labetalol (NORMODYNE, TRANDATE) syringe 5-10 mg
20 | INTRAVENOUS | Status: DC | PRN
Start: 2016-12-28 — End: 2016-12-28

## 2016-12-28 MED ORDER — succinylcholine (QUELICIN) 200 mg/10 mL (20 mg/mL) syringe
200 | INTRAVENOUS | Status: AC
Start: 2016-12-28 — End: ?

## 2016-12-28 MED ORDER — neostigmine (PROSTIGMINE) 3 mg/3 mL (1 mg/mL) injection
3 | INTRAVENOUS | Status: AC
Start: 2016-12-28 — End: ?

## 2016-12-28 MED ORDER — dexamethasone sodium phos (PF) (DECADRON) injection
10 | INTRAMUSCULAR | Status: DC | PRN
Start: 2016-12-28 — End: 2016-12-28
  Administered 2016-12-28: 19:00:00 10 mg/mL via INTRAVENOUS

## 2016-12-28 MED ORDER — famotidine (PF) (PEPCID) 20 mg in 0.9% NaCl IVPB Premix
20 | Freq: Once | INTRAVENOUS | Status: AC
Start: 2016-12-28 — End: 2016-12-28

## 2016-12-28 MED ORDER — rocuronium (ZEMURON) injection
10 | INTRAVENOUS | Status: DC | PRN
Start: 2016-12-28 — End: 2016-12-28
  Administered 2016-12-28 (×3): 10 mg/mL via INTRAVENOUS

## 2016-12-28 MED ORDER — propofol (DIPRIVAN) injection
10 | INTRAVENOUS | Status: DC | PRN
Start: 2016-12-28 — End: 2016-12-28
  Administered 2016-12-28 (×2): 10 mg/mL via INTRAVENOUS

## 2016-12-28 MED ORDER — bupivacaine (PF) (MARCAINE) 0.25 % (2.5 mg/mL) injection
0.25 | INTRAMUSCULAR | Status: AC
Start: 2016-12-28 — End: ?

## 2016-12-28 MED ORDER — bupivacaine (PF) (MARCAINE) 0.25 % (2.5 mg/mL) injection
0.25 | INTRAMUSCULAR | Status: DC | PRN
Start: 2016-12-28 — End: 2016-12-28
  Administered 2016-12-28: 23:00:00 0.25 % (2.5 mg/mL)

## 2016-12-28 NOTE — Progress Notes (Signed)
First assessment complete.  A&Ox4, pain 10/10, self report, medicated, see MAR.  Repositioned for comfort.  Safety maintained, call light within reach, will continue to monitor.

## 2016-12-28 NOTE — Op Note (Signed)
Surgeon: Truitt Merle, MD    Assistant: Vic Blackbird, FA    Anesthesiologist: Ophelia Charter, MD     Anesthesia: General and local    Preoperative Diagnosis: Colon cancer    Postoperative Diagnosis: Same    Operation: Laparoscopic converted to open right hemicolectomy    INDICATIONS: This is a 61 year old female who underwent recent screening colonoscopy and was found to have a circumferential constricting lesion at the hepatic flexure.  Biopsies demonstrated tubular adenoma with suspicion for intramucosal adenocarcinoma.    FINDINGS: Hepatic flexure mass resected within right hemicolectomy specimen.  Primary anastomosis without complication.      DESCRIPTION OF PROCEDURE:    After patient was made comfortable with anesthesia her abdomen was prepped and draped in sterile fashion.  A surgical pause was performed to confirm patient procedure.  Local anesthetic was infiltrated into the skin and subcutaneous tissues site just above the umbilicus and a small incision made a 5 mm optical trocar was then used into the abdominal cavity with care to avoid underlying visceral injury.  Pneumoperitoneum was established.  2 more 5 mm ports were then placed in the epigastrium and another in the right midabdomen.  The abdominal contents was explored.  Tattooing was evident at the hepatic flexure just distal to an area of colonic narrowing.  The liver was visualized and there were no masses or suspicious lesions.  The peritoneum was absence of any implants or suspicious masses.  The rest of the abdominal contents was relatively unremarkable.  I proceeded with dissection of the right colon in a lateral to medial orientation taking down the lateral peritoneal reflection from the cecum up to the hepatic flexure.  The hepatic flexure was subsequently mobilized using LigaSure.  There was some adhesions to the otherwise unremarkable appearing gallbladder.  The colon was mobilized to the extent of the mid transverse colon.     At this  point I placed a hand port at the upper midline.  I was able to palpate the colon tumor and mobilized further reflecting the entire right colon medially.  I then removed all laparoscopic instrumentation extending the hand port site another 3 cm allowing placement of a Bookwalter retractor.  With adequate visualization the right colon and terminal ileum was mobilized with dissection of the mesentery.  This was wedged out taking the blood supply with silk ligature.  A linear stapler was used to divide the transverse colon and terminal ileum.  Once the specimen had been completely removed from the surgical field a side to side anastomosis was created without tension extending from the transverse colon to the terminal ileum.  The anastomosis was widely patent and without evidence of leak.  A crotch stitch was placed and the mesenteric defect closed using a running 2-0 Vicryl suture.  The abdominal cavity was irrigated extensively.  There was a small venous bleed from the mesentery that was ligated.  Exposed mesentery near the mid upper abdomen had a slight bloody ooze to which Surgicel was placed.  Complete hemostasis was appreciated.    The peritoneum was reapproximated using a running 2-0 Vicryl suture.  The fascia was subsequently closed with 0 PDS suture.  The subcutaneous tissues were irrigated and padded dry.  Skin staples were then used to close all skin incisions.  Local anesthetic was infiltrated into all incision sites.  Padded dressing was applied.  The patient tolerated the procedure well.  There were no comp occasions.  Sponge, needle, and instrument counts were correct.  The  patient was awakened and taken to the postoperative recovery unit in stable condition.    Disposition: She will be admitted to the floor for continued monitoring and support.

## 2016-12-28 NOTE — Progress Notes (Signed)
Brandy [name]  1955/11/05  [id number]    Consulting Therapist: Truman Hayward    * Assessment/Eval.: Inital      Subjective:   Pt in no respiratory distress at this time. Pt does not use any home respiratory medications, is not a smoker, and denies any hx of lung disease. Pt does not use home oxygen, and does not use a CPAP/BIPAP at home.     Objective:  Respiratory Pattern/WOB: Regular, Unlabored  Chest Assessment: Chest expansion symmetrical    B/S: Clear bilaterally           O2 Device: Nasal cannula  SpO2: 99 %  O2 Flow Rate (L/min): 2.5 L/min                       Incentive Spirometry Achieved (mL): 1600 mL         Assessment:    Bronchodilators not indicated    Plan:    Maintain SpO2 >90%, I.S at bedside.

## 2016-12-28 NOTE — Anesthesia Pre-Procedure Evaluation (Addendum)
Anesthesia Evaluation     Patient summary reviewed and Nursing notes reviewed      Airway   Mallampati: III  TM distance: 6.5 - 8 cm  Neck ROM: fullobesity  TMJ problem    Dental - normal exam   (+) poor dentition    Pulmonary - negative ROS and normal exam    breath sounds clear to auscultation  Cardiovascular - normal exam  (+) valvular problems/murmurs, dysrhythmias, CHF,     Rhythm: regular  Rate: normal  ROS comment: s/p AVR    Neuro/Psych - negative ROS     GI/Hepatic/Renal    (+) obesity    Endo/Other  (+) , cancer    Comments: anemia   Musculoskeletal - negative ROS                   Anesthesia Plan    ASA 3     general     intravenous induction   Anesthetic Plan, Alternatives, Risks and Questions discussed with patient.    Pre-Anesthesia Evaluation Completed at:  12/28/2016 10:39 AM

## 2016-12-28 NOTE — OR Nursing (Signed)
Medical hx, allergies, NPO status and procedure confirmed with pt and family.  All questions answered.

## 2016-12-28 NOTE — OR Nursing (Signed)
Report to [employer].  VSS.  On 2 liters NC.  Pt got nauseated when we tried to leave the recovery room.  Dressing to abdomen CDI.  Transferred to room 203-1

## 2016-12-28 NOTE — Anesthesia Post-Procedure Evaluation (Signed)
Patient Name: Brandy [name]  Procedures performed: Procedure(s) with comments:  LAPAROPSCOPIC CONVERTED TO OPEN HAND ASSISTED RIGHT HEMI COLECTOMY - Dr. Oran Rein wants 3 hours    Last Vitals:        12/28/16 1544   BP: 145/86          12/28/16 1544   Pulse: 83          12/28/16 1544   Resp: 19          12/28/16 1544   SpO2: 100%          12/28/16 1544   Temp: 36.9 ?C       Planned Anesthesia Type: general  Final Anesthesia Type: general  Patients Current Location: PACU  Level of Consciousness: responds to stimulation and sedated  planned opioid use   Post Procedure Pain:adequate analgesia  Airway: Patent  Respiratory Status: face mask  Cardio Status: hemodynamically stable  Hydration: adequately hydrated  PONV prophylaxis Ordered  PONV? NO  no anesthesia complication,       The patient was able to participate in the post op evaluation    Comments:Report to nurse, patient in stable condition and care assumed by PACU RN      Basil Dess  3:48 PM

## 2016-12-28 NOTE — Interval H&P Note (Signed)
I have assessed the patient.    No interval change in H&P.    Inpatient Patient Class Confirmed.

## 2016-12-29 LAB — MAGNESIUM: Magnesium: 1.7 mg/dL (ref 1.3–2.5)

## 2016-12-29 LAB — BASIC METABOLIC PANEL
Anion Gap: 16.4 mmol/L (ref 12.0–20.0)
BUN / Creatinine Ratio: 23.2 — ABNORMAL HIGH (ref 12.0–20.0)
BUN: 16 mg/dL (ref 8–20)
CO2 - Carbon Dioxide: 20 mmol/L (ref 17–27)
Calcium: 9.8 mg/dL (ref 8.6–10.6)
Chloride: 105.9 mmol/L (ref 101.0–111.0)
Creatinine: 0.69 mg/dL (ref 0.60–1.30)
Glomerular Filtration Rate Estimate (Female): >60 mL/min/{1.73_m2} (ref >=60.0)
Glucose: 119 mg/dL — ABNORMAL HIGH (ref 74–106)
Osmolality Calculation: 278 mOsm/kg (ref 275.0–300.0)
Potassium: 4.31 mmol/L (ref 3.50–5.10)
Sodium: 138 mmol/L (ref 135–145)

## 2016-12-29 LAB — CBC WITH AUTO DIFFERENTIAL
Basophils %: 0 % (ref 0–2)
Basophils, Absolute: 0 10*3/ÂµL (ref 0.0–0.1)
Eosinophils %: 0 % (ref 0–5)
Eosinophils, Absolute: 0 10*3/ÂµL (ref 0.0–0.4)
HCT: 26.3 % — ABNORMAL LOW (ref 35.0–48.0)
Hemoglobin: 7.8 g/dL — ABNORMAL LOW (ref 11.7–16.5)
Lymphocytes %: 11 % — ABNORMAL LOW (ref 20–40)
Lymphocytes, Absolute: 1.2 10*3/ÂµL (ref 1.0–4.0)
MCH: 22.8 pg — ABNORMAL LOW (ref 28.3–33.3)
MCHC: 29.7 g/dL — ABNORMAL LOW (ref 32.5–36.0)
MCV: 76.9 fL — ABNORMAL LOW (ref 81.0–100.0)
MPV: 7.1 fL (ref 6.9–10.0)
Monocytes %: 7 % (ref 2–12)
Monocytes, Absolute: 0.7 10*3/ÂµL (ref 0.2–1.0)
Neutrophils %: 82 % — ABNORMAL HIGH (ref 50–74)
Neutrophils, Absolute: 8.8 10*3/ÂµL — ABNORMAL HIGH (ref 2.0–7.4)
Platelet Count: 287 10*3/ÂµL (ref 150–405)
RBC: 3.42 10*6/ÂµL — ABNORMAL LOW (ref 3.80–5.60)
RDW: 17.5 % — ABNORMAL HIGH (ref 11.7–16.1)
WBC: 10.7 10*3/ÂµL (ref 4.5–11.0)

## 2016-12-29 MED ORDER — ondansetron (ZOFRAN) injection 2-4 mg
4 | INTRAMUSCULAR | Status: DC | PRN
Start: 2016-12-29 — End: 2016-12-31

## 2016-12-29 MED ORDER — HYDROcodone-acetaminophen (NORCO) 7.5-325 mg per tablet 1 tablet
7.5-325 | ORAL | Status: DC | PRN
Start: 2016-12-29 — End: 2016-12-31
  Administered 2016-12-29 – 2016-12-31 (×9): via ORAL

## 2016-12-29 MED ORDER — carvedilol (COREG) tablet 6.25 mg
6.25 | Freq: Two times a day (BID) | ORAL | Status: DC
Start: 2016-12-29 — End: 2016-12-31
  Administered 2016-12-29 – 2016-12-31 (×6): 6.25 mg via ORAL

## 2016-12-29 MED ORDER — ampicillin-sulbactam (UNASYN) 3 g in 100 mL 0.9 % NaCl SNAP IVPB
Freq: Four times a day (QID) | INTRAMUSCULAR | Status: AC
Start: 2016-12-29 — End: 2016-12-29
  Administered 2016-12-29 (×4): via INTRAVENOUS

## 2016-12-29 MED ORDER — HYDROmorphone (DILAUDID) injection 0.5-1 mg
0.5 | INTRAMUSCULAR | Status: DC | PRN
Start: 2016-12-29 — End: 2016-12-28

## 2016-12-29 MED ORDER — ipratropium-albuterol (DUO-NEB) 0.5 mg-3 mg(2.5 mg base)/3 mL nebulizer solution 3 mL
0.5 | Freq: Once | RESPIRATORY_TRACT | Status: DC | PRN
Start: 2016-12-29 — End: 2016-12-28

## 2016-12-29 MED ORDER — lactated Ringers infusion
INTRAVENOUS | Status: DC
Start: 2016-12-29 — End: 2016-12-31
  Administered 2016-12-29 – 2016-12-31 (×7): via INTRAVENOUS

## 2016-12-29 MED ORDER — albuterol (PROVENTIL) nebulizer solution 2.5 mg
2.5 | Freq: Once | RESPIRATORY_TRACT | Status: DC | PRN
Start: 2016-12-29 — End: 2016-12-28

## 2016-12-29 MED ORDER — HYDROcodone-acetaminophen (NORCO) 5-325 mg per tablet 1 tablet
5-325 | ORAL | Status: DC | PRN
Start: 2016-12-29 — End: 2016-12-31

## 2016-12-29 MED ORDER — racepinephrine (VAPONEFRIN) 2.25 % nebulizer solution 0.5 mL
2.25 | Freq: Once | RESPIRATORY_TRACT | Status: DC | PRN
Start: 2016-12-29 — End: 2016-12-28

## 2016-12-29 MED ORDER — diphenhydrAMINE (BENADRYL) capsule 25 mg
25 | Freq: Four times a day (QID) | ORAL | Status: DC | PRN
Start: 2016-12-29 — End: 2016-12-31

## 2016-12-29 MED ORDER — acetaminophen (TYLENOL) suppository 650 mg
650 | RECTAL | Status: DC | PRN
Start: 2016-12-29 — End: 2016-12-31

## 2016-12-29 MED ORDER — acetaminophen (TYLENOL) tablet 650 mg
325 | ORAL | Status: DC | PRN
Start: 2016-12-29 — End: 2016-12-31

## 2016-12-29 MED ORDER — morphine injection 1-3 mg
2 | INTRAVENOUS | Status: DC | PRN
Start: 2016-12-29 — End: 2016-12-31
  Administered 2016-12-29 (×4): 2 mg via INTRAVENOUS

## 2016-12-29 MED ORDER — ketorolac (TORADOL) 15 mg/mL injection 15 mg
15 | Freq: Four times a day (QID) | INTRAMUSCULAR | Status: AC
Start: 2016-12-29 — End: 2016-12-30
  Administered 2016-12-29 – 2016-12-30 (×8): 15 mg via INTRAVENOUS

## 2016-12-29 MED ORDER — naloxone (NARCAN) syringe 0.1 mg
1 | INTRAMUSCULAR | Status: DC | PRN
Start: 2016-12-29 — End: 2016-12-31

## 2016-12-29 MED ORDER — atorvastatin (LIPITOR) tablet 20 mg
20 | Freq: Every evening | ORAL | Status: DC
Start: 2016-12-29 — End: 2016-12-31
  Administered 2016-12-29 – 2016-12-31 (×3): 20 mg via ORAL

## 2016-12-29 MED ORDER — HYDROcodone-acetaminophen (NORCO) 10-325 mg per tablet 1 tablet
10-325 | ORAL | Status: DC | PRN
Start: 2016-12-29 — End: 2016-12-31
  Administered 2016-12-30: 03:00:00 10-325 via ORAL

## 2016-12-29 MED ORDER — cyclobenzaprine (FLEXERIL) tablet 5 mg
10 | Freq: Three times a day (TID) | ORAL | Status: DC | PRN
Start: 2016-12-29 — End: 2016-12-31

## 2016-12-29 MED ORDER — diphenhydrAMINE (BENADRYL) injection 25 mg
50 | Freq: Four times a day (QID) | INTRAMUSCULAR | Status: DC | PRN
Start: 2016-12-29 — End: 2016-12-31

## 2016-12-29 NOTE — Plan of Care (Signed)
Problem: [facility] Braden Scale Score 18 & Lower  Goal: For scores 18 & lower:  For scores 18 & lower:   Outcome: Progressing  Pt ambulatory. Pt. Able to turn self. No skin breakdown noted. Continue to monitor.    Problem: Pain  Goal: Patient's pain/discomfort is manageable  Assess and monitor patient's pain using appropriate pain scale. Collaborate with interdisciplinary team and initiate plan and interventions as ordered. Re-assess patient's pain level 30 - 60 minutes after pain management intervention.    Outcome: Progressing  Patient reports adequate pain relief with current medication regimen. Will continue to encourage patient to report pain before it becomes severe and implement interventions as appropriate.

## 2016-12-29 NOTE — Progress Notes (Signed)
Progress Note  Name: Brandy [name]    DOB: [birth year] 61 y.o.    MRN: [id number]    CSN: [id number]    Chief Complaint: Status post right hemicolectomy for malignancy    Post Op Day: 1    Patient reports that abdominal pain is controlled.  She has been ambulating but denies any flatus or bowel movement yet.    Vital Sign Ranges for Last 24 Hours:  BP  Min: 117/73  Max: 155/83  Pulse  Min: 70  Max: 96  Resp  Min: 6  Max: 22  Temp  Min: 36.2 ?C (97.1 ?F)  Max: 36.9 ?C (98.4 ?F)  SpO2  Min: 85 %  Max: 100 %    Intake/Output Brief:    Intake/Output Summary (Last 24 hours) at 12/29/16 0915  Last data filed at 12/29/16 0439   Gross per 24 hour   Intake             4200 ml   Output             1025 ml   Net             3175 ml       General: Alert and oriented ?3, no acute distress    Abdomen: soft and nondistended    Incision: Well approximated without surrounding erythema or fluctuance    Labs:  Results for orders placed or performed during the hospital encounter of 12/28/16 (from the past 24 hour(s))   Hgb & Hct -STAT    Collection Time: 12/28/16 10:47 AM   Result Value Ref Range    Hemoglobin 10.1 (L) 11.7 - 16.5 g/dL    HCT 95.6 (L) 21.3 - 48.0 %   ABO/Rh -Once    Collection Time: 12/28/16 10:47 AM   Result Value Ref Range    ABO O     Rh (D) Positive    Antibody Screen -Once    Collection Time: 12/28/16 10:47 AM   Result Value Ref Range    Antibody Screen Negative    Basic Metabolic Panel -AM Draw    Collection Time: 12/29/16  4:50 AM   Result Value Ref Range    Sodium 138 135 - 145 mmol/L    Potassium 4.31 3.50 - 5.10 mmol/L    Chloride 105.9 101.0 - 111.0 mmol/L    CO2 - Carbon Dioxide 20 17 - 27 mmol/L    BUN 16 8 - 20 mg/dL    Creatinine 0.86 5.78 - 1.30 mg/dL    Glucose 469 (H) 74 - 106 mg/dL    Calcium 9.8 8.6 - 62.9 mg/dL    Anion Gap 52.8 41.3 - 20.0 mmol/L    Glomerular Filtration Rate Estimate (Female) >60.0 >=60.0 mL/min/1.45m*2    GFR Additional Info      BUN / Creatinine Ratio 23.2 (H) 12.0 - 20.0    Osmolality Calculation 278.0 275.0 - 300.0 mOsm/kg   Magnesium -AM Draw    Collection Time: 12/29/16  4:50 AM   Result Value Ref Range    Magnesium 1.7 1.3 - 2.5 mg/dL   CBC with Auto Differential -AM Draw    Collection Time: 12/29/16  4:50 AM   Result Value Ref Range    WBC 10.7 4.5 - 11.0 10*3/?L    RBC 3.42 (L) 3.80 - 5.60 10*6/?L    Hemoglobin 7.8 (L) 11.7 - 16.5 g/dL    HCT 24.4 (L) 01.0 - 48.0 %    MCV 76.9 (L)  81.0 - 100.0 fL    MCH 22.8 (L) 28.3 - 33.3 pg    MCHC 29.7 (L) 32.5 - 36.0 g/dL    RDW 54.0 (H) 98.1 - 16.1 %    Platelet Count 287 150 - 405 10*3/?L    MPV 7.1 6.9 - 10.0 fL    Neutrophils % 82 (H) 50 - 74 %    Lymphocytes % 11 (L) 20 - 40 %    Monocytes % 7 2 - 12 %    Eosinophils % 0 0 - 5 %    Basophils % 0 0 - 2 %    Neutrophils, Absolute 8.8 (H) 2.0 - 7.4 10*3/?L    Lymphocytes, Absolute 1.2 1.0 - 4.0 10*3/?L    Monocytes, Absolute 0.7 0.2 - 1.0 10*3/?L    Eosinophils, Absolute 0.0 0.0 - 0.4 10*3/?L    Basophils, Absolute 0.0 0.0 - 0.1 10*3/?L       Plan:   Patient is doing well postop day 1.  Continue with ambulation and incentive spirometry use.  Remove Foley catheter.  Await return of bowel function.  Supportive care.     LOS: 1 day     Truitt Merle  12/29/2016  9:15 AM

## 2016-12-29 NOTE — Plan of Care (Signed)
Problem: [facility] Braden Scale Score 18 & Lower  Goal: For scores 18 & lower:  For scores 18 & lower:   Outcome: Progressing  Patient increased ambulation satisfactorily this shift, will continue to monitor skin integrity    Problem: Pain  Goal: Patient's pain/discomfort is manageable  Assess and monitor patient's pain using appropriate pain scale. Collaborate with interdisciplinary team and initiate plan and interventions as ordered. Re-assess patient's pain level 30 - 60 minutes after pain management intervention.    Outcome: Progressing  Pain controlled fairly well with PRN pain medication as ordered, will continue to monitor

## 2016-12-29 NOTE — Progress Notes (Signed)
First assessment complete.  A&Ox4, pain 5/10, self report.  Repositioned for comfort, ambulated in hall with minimal supervision.  Safety maintained, call light within reach, will continue to monitor.

## 2016-12-30 LAB — BASIC METABOLIC PANEL
Anion Gap: 13.7 mmol/L (ref 12.0–20.0)
BUN / Creatinine Ratio: 17.3 (ref 12.0–20.0)
BUN: 13 mg/dL (ref 8–20)
CO2 - Carbon Dioxide: 25 mmol/L (ref 17–27)
Calcium: 9.6 mg/dL (ref 8.6–10.6)
Chloride: 104.9 mmol/L (ref 101.0–111.0)
Creatinine: 0.75 mg/dL (ref 0.60–1.30)
Glomerular Filtration Rate Estimate (Female): >60 mL/min/{1.73_m2} (ref >=60.0)
Glucose: 91 mg/dL (ref 74–106)
Osmolality Calculation: 279.1 mOsm/kg (ref 275.0–300.0)
Potassium: 3.64 mmol/L (ref 3.50–5.10)
Sodium: 140 mmol/L (ref 135–145)

## 2016-12-30 LAB — CBC WITH AUTO DIFFERENTIAL
Basophils %: 1 % (ref 0–2)
Basophils, Absolute: 0.1 10*3/ÂµL (ref 0.0–0.1)
Eosinophils %: 1 % (ref 0–5)
Eosinophils, Absolute: 0.1 10*3/ÂµL (ref 0.0–0.4)
HCT: 22.3 % — ABNORMAL LOW (ref 35.0–48.0)
Hemoglobin: 7.1 g/dL — ABNORMAL LOW (ref 11.7–16.5)
Lymphocytes %: 26 % (ref 20–40)
Lymphocytes, Absolute: 2.2 10*3/ÂµL (ref 1.0–4.0)
MCH: 23 pg — ABNORMAL LOW (ref 28.3–33.3)
MCHC: 31.9 g/dL — ABNORMAL LOW (ref 32.5–36.0)
MCV: 72.1 fL — ABNORMAL LOW (ref 81.0–100.0)
MPV: 7.3 fL (ref 6.9–10.0)
Monocytes %: 7 % (ref 2–12)
Monocytes, Absolute: 0.6 10*3/ÂµL (ref 0.2–1.0)
Neutrophils %: 65 % (ref 50–74)
Neutrophils, Absolute: 5.4 10*3/ÂµL (ref 2.0–7.4)
Platelet Count: 259 10*3/ÂµL (ref 150–405)
RBC: 3.09 10*6/ÂµL — ABNORMAL LOW (ref 3.80–5.60)
RDW: 17 % — ABNORMAL HIGH (ref 11.7–16.1)
WBC: 8.4 10*3/ÂµL (ref 4.5–11.0)

## 2016-12-30 MED ORDER — potassium chloride 10 mEq in sodium chloride 0.9 % (NS) 100 mL infusion
2 | Freq: Once | INTRAVENOUS | Status: AC
Start: 2016-12-30 — End: 2016-12-30
  Administered 2016-12-30 – 2016-12-31 (×2): via INTRAVENOUS

## 2016-12-30 MED ORDER — polyethylene glycol (MIRALAX) packet 17 g
17 | Freq: Every day | ORAL | Status: DC | PRN
Start: 2016-12-30 — End: 2016-12-31

## 2016-12-30 MED ORDER — senna-docusate (PERICOLACE) 8.6-50 mg per tablet 1 tablet
8.6-50 | Freq: Two times a day (BID) | ORAL | Status: DC
Start: 2016-12-30 — End: 2016-12-31
  Administered 2016-12-31 (×2): via ORAL

## 2016-12-30 MED ORDER — potassium chloride 10 mEq in sodium chloride 0.9 % (NS) 100 mL infusion
2 | Freq: Once | INTRAVENOUS | Status: AC
Start: 2016-12-30 — End: 2016-12-30
  Administered 2016-12-30 (×2): via INTRAVENOUS

## 2016-12-30 MED ORDER — docusate sodium (COLACE) capsule 100 mg
100 | Freq: Two times a day (BID) | ORAL | Status: DC
Start: 2016-12-30 — End: 2016-12-31
  Administered 2016-12-31 (×2): 100 mg via ORAL

## 2016-12-30 NOTE — Plan of Care (Signed)
Problem: [facility] Braden Scale Score 18 & Lower  Goal: For scores 18 & lower:  For scores 18 & lower:   Outcome: Progressing  Patient is mobilizing with minimal supervision in hallway, skin integrity is maintained, will continue to monitor     Problem: Pain  Goal: Patient's pain/discomfort is manageable  Assess and monitor patient's pain using appropriate pain scale. Collaborate with interdisciplinary team and initiate plan and interventions as ordered. Re-assess patient's pain level 30 - 60 minutes after pain management intervention.    Outcome: Progressing  Pain is well controlled on PO Norco as well as IV Toradol at this time, see MAR, will continue to monitor

## 2016-12-30 NOTE — Progress Notes (Signed)
First assessment complete.  A&Ox4, pain 2/10, self report, denies need for pain interventions.  Repositioned for comfort.  Safety maintained, call light within reach, will continue to monitor.

## 2016-12-30 NOTE — Progress Notes (Signed)
Progress Note  Name: Brandy [name]    DOB: [birth year] 61 y.o.    MRN: [id number]    CSN: [id number]    Chief Complaint: Post right hemicolectomy    Post Op Day: 2    Patient reports feeling better today.  Her pain is well controlled and she is ambulating without issue.  She has had no problems with urination but denies any flatus or bowel movement yet.  She has had no nausea and her appetite is returning.    Vital Sign Ranges for Last 24 Hours:  BP  Min: 100/61  Max: 114/72  Pulse  Min: 86  Max: 89  Resp  Min: 16  Max: 18  Temp  Min: 36.2 ?C (97.2 ?F)  Max: 37 ?C (98.6 ?F)  SpO2  Min: 88 %  Max: 94 %    Intake/Output Brief:    Intake/Output Summary (Last 24 hours) at 12/30/16 1337  Last data filed at 12/30/16 1010   Gross per 24 hour   Intake             3532 ml   Output             2425 ml   Net             1107 ml       General: Alert and oriented ?3, no acute distress    Abdomen: Soft, nontender, nondistended.    Incision: Well approximated and without erythema or other signs of infection.    Labs:  Results for orders placed or performed during the hospital encounter of 12/28/16 (from the past 24 hour(s))   CBC with Auto Differential -AM Draw    Collection Time: 12/30/16  6:04 AM   Result Value Ref Range    WBC 8.4 4.5 - 11.0 10*3/?L    RBC 3.09 (L) 3.80 - 5.60 10*6/?L    Hemoglobin 7.1 (L) 11.7 - 16.5 g/dL    HCT 95.6 (L) 21.3 - 48.0 %    MCV 72.1 (L) 81.0 - 100.0 fL    MCH 23.0 (L) 28.3 - 33.3 pg    MCHC 31.9 (L) 32.5 - 36.0 g/dL    RDW 08.6 (H) 57.8 - 16.1 %    Platelet Count 259 150 - 405 10*3/?L    MPV 7.3 6.9 - 10.0 fL    Neutrophils % 65 50 - 74 %    Lymphocytes % 26 20 - 40 %    Monocytes % 7 2 - 12 %    Eosinophils % 1 0 - 5 %    Basophils % 1 0 - 2 %    Neutrophils, Absolute 5.4 2.0 - 7.4 10*3/?L    Lymphocytes, Absolute 2.2 1.0 - 4.0 10*3/?L    Monocytes, Absolute 0.6 0.2 - 1.0 10*3/?L    Eosinophils, Absolute 0.1 0.0 - 0.4 10*3/?L    Basophils, Absolute 0.1 0.0 - 0.1 10*3/?L   Basic Metabolic  Panel -AM Draw    Collection Time: 12/30/16  6:04 AM   Result Value Ref Range    Sodium 140 135 - 145 mmol/L    Potassium 3.64 3.50 - 5.10 mmol/L    Chloride 104.9 101.0 - 111.0 mmol/L    CO2 - Carbon Dioxide 25 17 - 27 mmol/L    BUN 13 8 - 20 mg/dL    Creatinine 4.69 6.29 - 1.30 mg/dL    Glucose 91 74 - 528 mg/dL    Calcium 9.6 8.6 - 41.3 mg/dL  Anion Gap 13.7 12.0 - 20.0 mmol/L    Glomerular Filtration Rate Estimate (Female) >60.0 >=60.0 mL/min/1.27m*2    GFR Additional Info      BUN / Creatinine Ratio 17.3 12.0 - 20.0    Osmolality Calculation 279.1 275.0 - 300.0 mOsm/kg       Plan:   Patient is doing well and making progress appropriately.  Await return of bowel function.  Start clear liquids and advance slowly.  Encouraged increased ambulation and activity out of bed.  Okay to shower.  A.m. labs, follow hemoglobin.  Hold transfusion unless symptomatic or further decrease in H&H.       LOS: 2 days     Truitt Merle  12/30/2016  1:37 PM

## 2016-12-30 NOTE — Plan of Care (Signed)
Problem: Pain  Goal: Patient's pain/discomfort is manageable  Assess and monitor patient's pain using appropriate pain scale. Collaborate with interdisciplinary team and initiate plan and interventions as ordered. Re-assess patient's pain level 30 - 60 minutes after pain management intervention.    The pt. Will ask for analgesics as needed. Up and walked the halls.

## 2016-12-31 LAB — CELLAVISION DIFFERENTIAL
Basophils %: 0 % (ref 0–2)
Basophils, Absolute: 0 10*3/ÂµL (ref 0.0–0.1)
Eosinophils %: 1 % (ref 0–5)
Eosinophils, Absolute: 0.1 10*3/ÂµL (ref 0.0–0.4)
Lymphocytes %: 27 % (ref 20–40)
Lymphocytes, Absolute: 1.8 10*3/ÂµL (ref 1.0–4.0)
Monocytes %: 5 % (ref 2–12)
Monocytes, Absolute: 0.3 10*3/ÂµL (ref 0.2–1.0)
Neutrophils %: 67 % (ref 50–74)
Neutrophils, Absolute: 4.5 10*3/ÂµL (ref 2.0–7.4)
Platelet Estimate: ADEQUATE
Total Cells Counted: 100
WBC: 6.7 10*3/ÂµL (ref 4.5–11.0)

## 2016-12-31 LAB — CBC WITH AUTO DIFFERENTIAL
HCT: 22.7 % — ABNORMAL LOW (ref 35.0–48.0)
Hemoglobin: 7.2 g/dL — ABNORMAL LOW (ref 11.7–16.5)
MCH: 22.8 pg — ABNORMAL LOW (ref 28.3–33.3)
MCHC: 31.8 g/dL — ABNORMAL LOW (ref 32.5–36.0)
MCV: 71.8 fL — ABNORMAL LOW (ref 81.0–100.0)
MPV: 7.3 fL (ref 6.9–10.0)
Platelet Count: 272 10*3/ÂµL (ref 150–405)
RBC: 3.16 10*6/ÂµL — ABNORMAL LOW (ref 3.80–5.60)
RDW: 17.2 % — ABNORMAL HIGH (ref 11.7–16.1)
WBC: 6.7 10*3/ÂµL (ref 4.5–11.0)

## 2016-12-31 LAB — MAGNESIUM: Magnesium: 1.7 mg/dL (ref 1.3–2.5)

## 2016-12-31 LAB — BASIC METABOLIC PANEL
Anion Gap: 16 mmol/L (ref 12.0–20.0)
BUN / Creatinine Ratio: 12.5 (ref 12.0–20.0)
BUN: 7 mg/dL — ABNORMAL LOW (ref 8–20)
CO2 - Carbon Dioxide: 24 mmol/L (ref 17–27)
Calcium: 9.5 mg/dL (ref 8.6–10.6)
Chloride: 103.8 mmol/L (ref 101.0–111.0)
Creatinine: 0.56 mg/dL — ABNORMAL LOW (ref 0.60–1.30)
Glomerular Filtration Rate Estimate (Female): >60 mL/min/{1.73_m2} (ref >=60.0)
Glucose: 92 mg/dL (ref 74–106)
Osmolality Calculation: 277 mOsm/kg (ref 275.0–300.0)
Potassium: 3.76 mmol/L (ref 3.50–5.10)
Sodium: 140 mmol/L (ref 135–145)

## 2016-12-31 LAB — POTASSIUM: Potassium: 3.86 mmol/L (ref 3.50–5.10)

## 2016-12-31 LAB — PHOSPHORUS: Phosphorus: 2.2 mg/dL — ABNORMAL LOW (ref 2.5–4.7)

## 2016-12-31 MED ORDER — senna-docusate (PERICOLACE) 8.6-50 mg per tablet
8.6-50 | ORAL_TABLET | Freq: Two times a day (BID) | ORAL | 11 refills | 27.50000 days | Status: AC
Start: 2016-12-31 — End: 2017-12-31

## 2016-12-31 MED ORDER — docusate sodium (COLACE) 100 MG capsule
100 | Freq: Two times a day (BID) | ORAL | 0.00 refills | 30.00000 days | Status: DC
Start: 2016-12-31 — End: 2019-09-03

## 2016-12-31 MED ORDER — polyethylene glycol (MIRALAX) 17 gram packet
17 | Freq: Every day | ORAL | 0 refills | 15.00000 days | Status: AC | PRN
Start: 2016-12-31 — End: 2017-01-03

## 2016-12-31 MED ORDER — ondansetron (ZOFRAN-ODT) 4 MG disintegrating tablet
4 | ORAL_TABLET | Freq: Three times a day (TID) | ORAL | 0 refills | Status: AC | PRN
Start: 2016-12-31 — End: 2017-01-07

## 2016-12-31 MED ORDER — HYDROcodone-acetaminophen (NORCO) 5-325 mg per tablet
5-325 | ORAL_TABLET | ORAL | 0 refills | 30.00000 days | Status: DC | PRN
Start: 2016-12-31 — End: 2018-01-01

## 2016-12-31 NOTE — Plan of Care (Signed)
Problem: [facility] Braden Scale Score 18 & Lower  Goal: For scores 18 & lower:  For scores 18 & lower:   Outcome: Progressing  Patient has full mobility and is up ad lib, Braden scale score about 18 currently, will continue to monitor for skin integrity    Problem: Pain  Goal: Patient's pain/discomfort is manageable  Assess and monitor patient's pain using appropriate pain scale. Collaborate with interdisciplinary team and initiate plan and interventions as ordered. Re-assess patient's pain level 30 - 60 minutes after pain management intervention.    Outcome: Progressing  Patient tolerating pain levels very well, PRN pain medication effective, will continue to monitor

## 2016-12-31 NOTE — Discharge Summary (Signed)
Physician Discharge Summary     Patient ID:  Name: Brandy [name]  DOB: December 09, 1955 61 y.o.  MRN: [id number]    Admit date: 12/28/2016    Discharge date:  12/31/16    Diagnosis Requiring Admission: Right hemicolectomy for colon cancer    Additional Diagnoses:    Past Medical History:   Diagnosis Date   . Arrhythmia    . Cancer (CMS/HCC)     cancerous polyp from colonoscopy   . CHF (congestive heart failure) (CMS/HCC)    . Colon cancer (CMS/HCC) 12/28/2016   . Congestive heart failure (CHF) (CMS/HCC)    . History of blood transfusion    . 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 AORTIC VALVE REPLACEMENT     . CARDIAC SURGERY  01/23/2016    Valve replacement   . PROCEDURE N/A 01/27/2016    Procedure: AORTIC VALVE REPLACEMENT; INTRA AORTIC BALLOON INSERTION;  Surgeon: Otho Bellows, MD;  Location: [facility] OR;  Service: Cardiology;  Laterality: N/A;   . PROCEDURE Right 03/10/2016    Procedure: AMPUTATION TOE;  Surgeon: Chrystie Nose, DPM;  Location: [facility] OR;  Service: Podiatry;  Laterality: Right;   . PROCEDURE N/A 11/28/2016    Procedure: COLONOSCOPY; BIOPSY;  Surgeon: Gailen Shelter, MD;  Location: [facility] OR;  Service: [facility] Procedures;  Laterality: N/A;  ink spot marking apple core mass proximal lesion distal to hepatic flexure   . PROCEDURE Right 12/28/2016    Procedure: LAPAROPSCOPIC CONVERTED TO OPEN HAND ASSISTED RIGHT HEMI COLECTOMY;  Surgeon: Truitt Merle, MD;  Location: [facility] OR;  Service: [facility] Procedures;  Laterality: Right;  Dr. Oran Rein wants 3 hours   . TOE AMPUTATION Right     middle toe amputated d/t heparin reaction   . TONSILLECTOMY     . TUBAL LIGATION  1980       Significant Diagnostic Studies:   Results for orders placed or performed during the hospital encounter of 12/28/16 (from the past 24 hour(s))   Potassium -Timed    Collection Time: 12/30/16  6:30 PM   Result Value Ref Range    Potassium 3.86 3.50 - 5.10 mmol/L   CBC with Auto Differential  -AM Draw    Collection Time: 12/31/16  4:57 AM   Result Value Ref Range    WBC 6.7 4.5 - 11.0 10*3/?L    RBC 3.16 (L) 3.80 - 5.60 10*6/?L    Hemoglobin 7.2 (L) 11.7 - 16.5 g/dL    HCT 40.9 (L) 81.1 - 48.0 %    MCV 71.8 (L) 81.0 - 100.0 fL    MCH 22.8 (L) 28.3 - 33.3 pg    MCHC 31.8 (L) 32.5 - 36.0 g/dL    RDW 91.4 (H) 78.2 - 16.1 %    Platelet Count 272 150 - 405 10*3/?L    MPV 7.3 6.9 - 10.0 fL   Magnesium -AM Draw    Collection Time: 12/31/16  4:57 AM   Result Value Ref Range    Magnesium 1.7 1.3 - 2.5 mg/dL   Phosphorus -AM Draw    Collection Time: 12/31/16  4:57 AM   Result Value Ref Range    Phosphorus 2.2 (L) 2.5 - 4.7 mg/dL   Basic Metabolic Panel -AM Draw    Collection Time: 12/31/16  4:57 AM   Result Value Ref Range    Sodium 140 135 - 145 mmol/L    Potassium 3.76 3.50 - 5.10 mmol/L  Chloride 103.8 101.0 - 111.0 mmol/L    CO2 - Carbon Dioxide 24 17 - 27 mmol/L    BUN 7 (L) 8 - 20 mg/dL    Creatinine 1.61 (L) 0.60 - 1.30 mg/dL    Glucose 92 74 - 096 mg/dL    Calcium 9.5 8.6 - 04.5 mg/dL    Anion Gap 40.9 81.1 - 20.0 mmol/L    Glomerular Filtration Rate Estimate (Female) >60.0 >=60.0 mL/min/1.27m*2    GFR Additional Info      BUN / Creatinine Ratio 12.5 12.0 - 20.0    Osmolality Calculation 277.0 275.0 - 300.0 mOsm/kg   Cell[employer]ision Differential -Next Routine    Collection Time: 12/31/16  4:57 AM   Result Value Ref Range    WBC 6.7 4.5 - 11.0 10*3/?L    Neutrophils % 67 50 - 74 %    Lymphocytes % 27 20 - 40 %    Monocytes % 5 2 - 12 %    Eosinophils % 1 0 - 5 %    Basophils % 0 0 - 2 %    Neutrophils, Absolute 4.5 2.0 - 7.4 10*3/?L    Lymphocytes, Absolute 1.8 1.0 - 4.0 10*3/?L    Monocytes, Absolute 0.3 0.2 - 1.0 10*3/?L    Eosinophils, Absolute 0.1 0.0 - 0.4 10*3/?L    Basophils, Absolute 0.0 0.0 - 0.1 10*3/?L    Total Cells Counted 100     Platelet Estimate Adequate Adequate    Anisocytosis 1+     Microcytes 1+      Ct Chest Abdomen Pelvis With Iv Contrast    Result Date: 12/15/2016  EXAMINATION: CT CHEST  ABDOMEN PELVIS W IV CONTRAST, [id number] HISTORY: newly diagnosed proximal colon cancer COMPARISON: Chest CT 01/21/2016. TECHNIQUE: 2.5 mm CT images were obtained of the chest, abdomen, and pelvis with narrow collimation after the injection of IV nonionic contrast.  Coronal MIP reformatted images were obtained. Automated exposure control based on patient size was utilized as a dose reduction technique. FINDINGS: CHEST: LUNGS: 1.4 cm region of small pulmonary nodules in tree-in-bud formation.  Differential diagnosis includes, but is not limited to, metastatic disease, primary lung neoplasm, and infectious/inflammatory change.  Recommend follow-up CT scan in 3-6 months.  Linear subpleural scarring in the right upper lobe laterally. PULMONARY ARTERIES/ VASCULATURE: No pulmonary emboli seen.Pulmonary vascularity is within normal limits. THORACIC AORTA: No aneurysm or dissection seen. PLEURA: No pneumothorax or effusion. HEART / PERICARDIUM: No cardiomegaly.No pericardial effusion.  Aortic valve replacement is seen. MEDIASTINUM/ HILA: No discrete mass or bulky adenopathy. CHEST WALL: No significant chest wall mass or fluid collection.  No bulky axillary adenopathy.  No displaced fracture or focal osseous lesion.  However, the exam is not formatted for evaluation of bony structures, which limits assessment for osseous abnormalities.Sternal wires are seen in place, without obvious displaced wire fragment. OTHER: None. ABDOMEN: LIVER: No enlargement, atrophy, abnormal density, or significant focal mass. GALLBLADDER: No radiopaque gallstones.  4.9 mm partially calcified polyp of the gallbladder fundus. BILE DUCTS: No evidence of intra or extrahepatic biliary ductal dilation or calcification. SPLEEN: No enlargement or obvious focal lesion. KIDNEYS: No solid mass, or complex cystic lesion. No hydronephrosis. No renal calculi seen. ADRENALS: No mass or enlargement. PANCREAS: No mass, fluid collection, ductal dilatation, atrophy or  peripancreatic fat stranding. RETROPERITONEUM: No mass, or bulky adenopathy. AORTA/VASCULAR: No abdominal aortic aneurysm or dissection. ABDOMINAL WALL: No mass or hernia. PELVIS: URINARY BLADDER:  No visible focal wall thickening, lesion, or calculus. PELVIC  NODES:  No bulky adenopathy. PELVIC ORGANS:  No visible mass.  Pelvic organs appropriate for patient age. INGUINAL REGIONS: No evidence of inguinal hernia or bulky adenopathy. BOWEL/MESENTERY: 4.7 cm circumferential mass in the hepatic flexure of the colon with transmural extension of the mass into the surrounding mesenteric fat.  This is consistent with colon carcinoma.  No mesenteric adenopathy showing abnormal enhancement or abnormal size. PERITONEUM:No free intraperitoneal air.No intra-abdominal ascites. BONES: No obvious fracture or osseous lesion.  Submitted images are not formatted in bone algorithm. Diffuse degenerative changes to the spine. OTHER:  Multiple small phleboliths in the pelvis which limits assessment for distal ureteral calculi.     IMPRESSION: 1. 1.4 cm nonspecific region of small pulmonary nodules in tree-in-bud formation, and adjacent small satellite nodules.  Differential diagnosis includes, but is not limited to, metastatic disease, primary lung neoplasm, and infectious/inflammatory change.  Recommend follow-up CT scan in 3-6 months. 2. 4.7 cm circumferential mass in the hepatic flexure of the colon with transmural extension of the mass into the surrounding mesenteric fat. This is consistent with colon carcinoma. Dictated by: Junious Dresser Electronically Signed by: Junious Dresser on 12/15/2016 1:21 PM Report ID#: [id number] Reading Location: [id number]      Operations/Procedures: Right hemicolectomy    Consults:   None    Hospital Course: This is a 61 year old female with newly diagnosed right-sided hepatic flexure colon cancer identified on screening colonoscopy.  Patient underwent laparoscopic converted to open right hemicolectomy without  complication.  Postoperatively she was monitored on the floor demonstrated satisfactory improvement.  Her diet was advanced and bowel function returned.  She was found to be appropriate for release on postop day 3.  Please see medical record for further details.    Exam:   Vital signs within normal limits  General: No acute distress, clinically improved overall.    Disposition: Discharged to home    Follow Up: General surgery clinic in 10-14 days for wound check and staple removal    Discharge Medications:     Patient's Discharge Medication List      TAKE these medications      Indications of Use   acetaminophen 650 MG CR tablet  Quantity:  30 tablet  Commonly known as:  TYLENOL  Take 1 tablet by mouth every 8 hours as needed for Pain.      aspirin 325 MG EC tablet  Take 325 mg by mouth every morning.      atorvastatin 20 MG tablet  Quantity:  90 tablet  Commonly known as:  LIPITOR  Take 1 tablet by mouth every night at bedtime.      carvedilol 6.25 MG tablet  Quantity:  180 tablet  Commonly known as:  COREG  Take 1 tablet by mouth 2 times daily with meals.      cyclobenzaprine 5 MG tablet  Quantity:  30 tablet  What changed:  when to take this  Commonly known as:  FLEXERIL  Take 1 tablet by mouth 3 times daily as needed for Muscle spasms.      docusate sodium 100 MG capsule  Commonly known as:  COLACE  Take 1 capsule by mouth 2 times daily.      HYDROcodone-acetaminophen 5-325 mg per tablet  Quantity:  30 tablet  Commonly known as:  NORCO  Take 1-2 tablets by mouth every 4 hours as needed for Pain.      ondansetron 4 MG disintegrating tablet  Quantity:  20 tablet  Commonly known as:  ZOFRAN-ODT  Take 1 tablet by mouth every 8 hours as needed for Nausea for up to 7 days.      polyethylene glycol 17 gram packet  Quantity:  14 each  Commonly known as:  MIRALAX  Take 1 packet by mouth daily as needed for up to 3 days.      senna-docusate 8.6-50 mg per tablet  Quantity:  60 tablet  Commonly known as:  PERICOLACE  Take 1  tablet by mouth every 12 hours.           Diet:  Regular, soft diet    Activity: No heavy lifting greater than 20 pounds, avoid strenuous activity or constipation.  No twisting, bending, or squatting.    A total of 30 minutes or less was spent on this discharge summary.    Truitt Merle MD  12/31/2016  9:54 AM

## 2016-12-31 NOTE — Progress Notes (Signed)
The pt. Was given discharge instructions, she offered no additional questions. Are taking their prescriptions with them. Have all of their belongings.

## 2017-01-02 NOTE — Telephone Encounter (Addendum)
Hospital Follow up Care Coordination Phone Call    ZOX:WRUEAV Pace, MD    Date of Admission:12/28/2016  Reason for Admission: malignant neoplasm of the colon, right hemi colectomy  HX:CHF  Date of Discharge: 12/31/16  Discharged to: home      Telephone call to patient to follow up recent hospitalization. Spoke with patient,Brandy [name] .    Overall, how are you (or the patient) doing now?   pretty darn good,a little sore  Pt denies nausea,vomiting,fever and chest pain.  Pt is passing gas but has not had bowel movement.  Pt reports eating a little but not pushing it.  Pt reports her incision  looks good. Pt reports decreasing pain.  Pt states she is a little sob but thinks it is due to breathing a little shallowly when in discomfort.  Encouraged pt to take pain medication as needed.    Were there any changes or new medications added during hospitalization or when you were discharged?    YES  miralax,peri colace,Norco and zofran prn  Do you have all of your medications? Have you started taking them?    Pt has medications and is taking as directed.    Do you have any concerns about being able to take care of your needs?  Not at this time.  Pt reports her husband is assisting her as needed.  Reminded pt that she should restrict lifting to less than 20lbs.  Pt denies transportation issues.  Any other questions or concerns?   Not at this time.  Pt reminded that [facility] has same day apts for urgent needs and a provider on call for questions or concerns when the clinic is closed.  Follow up appointment, (patient reminded to bring discharge paperwork and all medication bottles/packages to appointment):   Future Appointments  Date Time Provider Department Center   01/16/2017 2:45 PM Truitt Merle, MD [facility]          Assessment/Plan (any follow up labs, tests or referrals?):     Pt scheduled for post op follow up and staple removal with surgeon, Dr.Ogao 01/16/2017 2:45,pt aware and reminded by phone  Pt reports she also has  follow up apt with [facility]

## 2017-01-03 ENCOUNTER — Ambulatory Visit

## 2017-01-03 NOTE — Telephone Encounter (Signed)
Patient being followed by Dr Oran Rein.

## 2017-01-16 ENCOUNTER — Ambulatory Visit: Admit: 2017-01-16 | Discharge: 2017-01-16 | Attending: MD

## 2017-01-16 DIAGNOSIS — C183 Malignant neoplasm of hepatic flexure: Secondary | ICD-10-CM

## 2017-01-16 NOTE — Progress Notes (Signed)
Brandy [name] is a 61 y.o. female here for post op - right hemicolectomy. Surgery date: 12/28/16.  RM

## 2017-01-16 NOTE — Progress Notes (Signed)
SUBJECTIVE:  Patient is here for follow-up after undergoing right hemicolectomy for a hepatic flexure tumor.  Patient reports recovering well from surgery describing no fevers, nausea, or bowel dysfunction.  She is tolerating a regular diet and feels that her energy levels are returning.    OBJECTIVE:  Abdomen is soft, nontender, nondistended.  Midline surgical scar is healing well and without evidence of infection.    Pathology:    Final Pathologic Diagnosis   A. Proximal colon and distal ileum, right hemicolectomy:  Tumor Site: Right (ascending) colon.  Tumor Size: Greatest dimension: 3.5 cm               Additional dimensions: 3.0 x 0.6 cm  Histologic Type: Adenocarcinoma  Histologic Grade: G1: Well Differentiated  Tumor Extension: Tumor invades through the muscularis propria into pericolorectal tissue  Margins: All margins are uninvolved by invasive carcinoma, high-grade dysplasia, intramucosal adenocarcinoma, and adenoma               Margins examined: Proximal and Distal               Distance of invasive carcinoma from closest margin: 4.0 cm               Specify closest margin: Distal Colonic  Treatment Effect: No known presurgical therapy  Lymphovascular Invasion: Not identified  Perineural Invasion: Not identified  Tumor Budding: Number of tumor buds in 1 hotspot field: Five (5): Intermediate score  Tumor Deposits: Not identified  Regional Lymph Nodes:               Number of Lymph Nodes Involved: Zero (0)               Number of Lymph Nodes Examined: Twenty-nine (29)  Pathologic Stage Classification (pTNM, AJCC 8th Edition):               Primary Tumor: pT3: Tumor invades through the muscularis propria into pericolorectal tissues               Regional Lymh Nodes: pN0: No regional lymph node metastasis  Additional Pathologic Findings: Appendix negative for significant pathologic changes.         ASSESSMENT/PLAN:    SNOMED CT(R)    1. 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  Skin staples are removed.  She was found to have a T3N0 tumor and the pathology report was reviewed with the patient.  She will be referred to medical oncology for further surveillance management.  I do not see that a CEA was ever drawn preop and thus this will be ordered.  A repeat CT of the chest to follow-up on the left lung lesion will need to be obtained in 3-6 months per radiology.  This may be done concurrently with her CT abdomen and pelvis as recommended by NCCN guidelines.  I advised on abstaining from any strenuous activity or heavy lifting for another 5-6 weeks.  All questions were answered.  Will plan on surveillance colonoscopy in 1 year.

## 2017-02-01 ENCOUNTER — Other Ambulatory Visit: Admit: 2017-02-01

## 2017-02-01 ENCOUNTER — Ambulatory Visit: Admit: 2017-02-01 | Discharge: 2017-03-01

## 2017-02-01 DIAGNOSIS — C183 Malignant neoplasm of hepatic flexure: Secondary | ICD-10-CM

## 2017-02-01 LAB — CELLAVISION DIFFERENTIAL
Basophils %: 0 % (ref 0–2)
Basophils, Absolute: 0 10*3/ÂµL (ref 0.0–0.1)
Eosinophils %: 2 % (ref 0–5)
Eosinophils, Absolute: 0.2 10*3/ÂµL (ref 0.0–0.4)
Lymphocytes %: 34 % (ref 20–40)
Lymphocytes, Absolute: 2.7 10*3/ÂµL (ref 1.0–4.0)
Monocytes %: 6 % (ref 2–12)
Monocytes, Absolute: 0.5 10*3/ÂµL (ref 0.2–1.0)
Myelocytes %: 1 %
Myelocytes, Absolute: 0.1 10*3/ÂµL — ABNORMAL HIGH
Neutrophils %: 57 % (ref 50–74)
Neutrophils, Absolute: 4.5 10*3/ÂµL (ref 2.0–7.4)
Platelet Estimate: ADEQUATE
Total Cells Counted: 100
WBC: 7.8 10*3/ÂµL (ref 4.5–11.0)

## 2017-02-01 LAB — CBC WITH AUTO DIFFERENTIAL
HCT: 32.5 % — ABNORMAL LOW (ref 35.0–48.0)
Hemoglobin: 9.9 g/dL — ABNORMAL LOW (ref 11.7–16.5)
MCH: 21.3 pg — ABNORMAL LOW (ref 28.3–33.3)
MCHC: 30.3 g/dL — ABNORMAL LOW (ref 32.5–36.0)
MCV: 70.2 fL — ABNORMAL LOW (ref 81.0–100.0)
MPV: 7.7 fL (ref 6.9–10.0)
Platelet Count: 419 10*3/ÂµL — ABNORMAL HIGH (ref 150–405)
RBC: 4.63 10*6/ÂµL (ref 3.80–5.60)
RDW: 18.3 % — ABNORMAL HIGH (ref 11.7–16.1)
WBC: 7.8 10*3/ÂµL (ref 4.5–11.0)

## 2017-02-01 NOTE — Consults (Signed)
[facility]  [place], [place]  MEDICAL ONCOLOGY CONSULTATION    [name], Brandy [name]  CHART#:  [id number] E  ACCT#:  [id number]  PATIENT DOB:  21-Mar-1956    DATE OF SERVICE:  02/01/2017.      Brandy [name] is a 61 year old lady who underwent aortic valve repair in [place] 1 year ago.  Patient came to see her family physician, Robina Ade, MD, and was discovered to be anemic.  She was referred to Fortunato Curling, MD, who found a circumferential apple core-like lesion in hepatic flexure.  Biopsy showed a tubular adenoma, and the patient was taken to surgery by Montel Clock, MD.  Patient, at the time of surgery, underwent a right hemicolectomy for a hepatic flexure tumor.  The tumor was 3.5 cm in greatest size.  It was a well-differentiated tumor.  It was T3.  It penetrated through the wall into the perirectal tissues, but there was no lymph node involvement or any extra colonic implants.  Patient is now several weeks postop.    PAST MEDICAL HISTORY:  In 11/2016, the patient's hemoglobin was markedly depressed at 7.2 with a hematocrit of 21%.  Her MCV and MCHC were both depressed, indicative of iron deficiency anemia.  Patient's white count was 6.7.  Her platelet count was 272,000.  Her stools were positive for occult blood.  Patient subsequently had an evaluation and was found to have the colon cancer.  One year ago, the patient grew very short of breath and was found to have a bicuspid aortic valve and underwent aortic valve replacement over in [place], [place].  Postoperatively, she was on Coumadin for several months and then switched to a large aspirin 325, 1 a day, and has been on that in the intervening 9 months.  This may well be the cause of her severe iron deficiency anemia in conjunction with her colon cancer.  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  Patient is also allergic to SULFA.    SOCIAL HISTORY:  Patient is of  [place] extraction.  She was born and raised in a [place], [place], and then moved to [place], [place].  She has been married to the same man now for many years.  She has 3 grown sons, and they are in their 35s, and they live in [place], [place] and [place].  They are all married and working.  Her husband is a retired Music therapist, and they live 20 miles [place] of [address] along [address].  Patient is a never smoker, a never drinker.    FAMILY HISTORY:  Her mother had colon cancer.  Her mother also had ovarian cancer.  Her father had heart disease and died of that.  She had 1 brother with cancer of type unknown.  She had another aunt who had uterine cancer.  She had another uncle, a maternal uncle and a maternal aunt both had cancer.  In the past, the father's side of the family, she states has, by history, some cancer on that side of the family, but she does not know that history very well or who had it or what kind of cancers they had.    REVIEW OF SYSTEMS:  Patient is tired.  She states the CAT scan showed a nodule in her lungs, and she is quite nervous about that nodule.  She was wondering if it was metastatic cancer.  She has no heartburn.  She has no  swallowing difficulties, no GI complaints.  She has had a tubal ligation, but she still has her uterus.  She has not had any postmenopausal bleeding.  She is somewhat overweight and inactive.  She does have some dyspnea when walking longer distances which would be after a couple of blocks.    PHYSICAL EXAMINATION:  GENERAL:  Reveals a 61 year old lady who appears her stated age, in no acute distress.  The patient is 5 feet 3 inches.  She is overweight at 245 pounds.  VITAL SIGNS:  Patient's O2 sats are 98% on room air.  Her pulse is 88 and regular.  Her temperature is 97.  Blood pressure is 142/82.    HEENT:  Patient has a pale complexion, blue eyes, long gray-brown hair in a ponytail.  She has some high color on both cheeks.  NECK:  Without  adenopathy.  CHEST:  Clear.  BREASTS:  Large and symmetrical, without masses.    AXILLAE:  There is no axillary adenopathy.  HEART:  She has a soft systolic murmur, 1/2.  She has a regular rhythm.  ABDOMEN:  Soft, obese without organomegaly.  EXTREMITIES:  Well-developed without lesions.    IMPRESSION:  1.  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  2.  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  3.  Fairly strong family history of various cancers.  4.  Exogenous obesity.    PLAN:  There is no evidence that stage II colon cancers without lymph node involvement benefit from adjuvant therapy.  Patient has a 1.4 cm, small pulmonary nodule that was present on her CT scan of the chest done last month.  The radiologist recommended repeating that scan in 3 months.  We are going to go ahead and begin the patient on oral iron 1 tablet 325 mg 1 a day because of her severe iron deficiency anemia that resulted in the discovery of her colon cancer.  Patient, at the present time, still has some dyspnea on exertion, and she continues to take 1 whole aspirin a day as an anticoagulant and perhaps this is aggravating some gastrointestinal bleeding.  We will check a CBC today, and she will return to see [place] in 2 months and we will check a CBC at that time, a CEA and a CMP.  We will follow the patient expectantly and watch for tumor recurrence.  The chance the patient is cured by the surgery probably is about 75%, but there is a risk of about 25% of tumor recurrence.    SWEMA:FOXS                       Rhona Raider Markell Schrier, MD  D:  02/01/2017 12:15:14            T:  02/01/2017 14:50:01            V/D:  [id number]               E:  ANDER/02/02/2017 08:47:40      C:  Robina Ade MD      Montel Clock MD      Fortunato Curling MD               1

## 2017-02-05 NOTE — Telephone Encounter (Signed)
Patient is requesting a refill of carvedilol, last seen by [facility] on 11/16/16, and no FYI restrictions.  Routed pended order to Robina Ade, MD for review.     No future appointments.     BP Readings from Last 3 Encounters:   12/31/16 150/86   12/20/16 141/84   12/06/16 (!) 142/94     Pulse Readings from Last 3 Encounters:   12/31/16 89   12/20/16 90   12/06/16 93       Last Labs:    LDL Calculated   Date Value Ref Range Status   09/06/2016 69 0 - 129 mg/dL Final     Triglyceride   Date Value Ref Range Status   09/06/2016 118.0 <=150.0 mg/dL Final     Sodium   Date Value Ref Range Status   12/31/2016 140 135 - 145 mmol/L Final     Potassium   Date Value Ref Range Status   12/31/2016 3.76 3.50 - 5.10 mmol/L Final     AST - Aspartate Aminotransferase   Date Value Ref Range Status   12/20/2016 15 5 - 32 IU/L Final     ALT - Alanine Aminotransferase   Date Value Ref Range Status   12/20/2016 13 5 - 33 IU/L Final     BUN   Date Value Ref Range Status   12/31/2016 7 (L) 8 - 20 mg/dL Final     No results found for: HGBA1C, MICROALBUR, MALB24HUR  Glomerular Filtration Rate Estimate   Date Value Ref Range Status   03/06/2016 >60.0 >=60.0 mL/min/1.54m*2 Final     Glomerular Filtration Rate Estimate (Female)   Date Value Ref Range Status   12/31/2016 >60.0 >=60.0 mL/min/1.77m*2 Final

## 2017-02-05 NOTE — Telephone Encounter (Signed)
Pt called in to request a refill of: carvedilol (COREG) 6.25 MG tablet  [facility]  Callback number: [phone]

## 2017-02-06 MED ORDER — carvedilol (COREG) 6.25 MG tablet
6.25 | ORAL_TABLET | Freq: Two times a day (BID) | ORAL | 1 refills | 90.00000 days | Status: DC
Start: 2017-02-06 — End: 2017-10-29

## 2017-04-03 ENCOUNTER — Other Ambulatory Visit: Admit: 2017-04-03

## 2017-04-03 ENCOUNTER — Ambulatory Visit: Admit: 2017-04-03 | Discharge: 2017-04-20

## 2017-04-03 DIAGNOSIS — D509 Iron deficiency anemia, unspecified: Secondary | ICD-10-CM

## 2017-04-03 DIAGNOSIS — C183 Malignant neoplasm of hepatic flexure: Secondary | ICD-10-CM

## 2017-04-03 LAB — COMPREHENSIVE METABOLIC PANEL
ALT - Alanine Aminotransferase: 14 IU/L (ref 5–33)
AST - Aspartate Aminotransferase: 16 IU/L (ref 5–32)
Albumin/Globulin Ratio: 1.2 (ref 1.2–2.2)
Albumin: 3.8 g/dL (ref 3.5–5.0)
Alkaline Phosphatase: 128 IU/L — ABNORMAL HIGH (ref 35–105)
Anion Gap: 15.6 mmol/L (ref 12.0–20.0)
BUN / Creatinine Ratio: 16.4 (ref 12.0–20.0)
BUN: 11 mg/dL (ref 8–20)
Bilirubin Total: 0.3 mg/dL (ref 0.10–1.70)
CO2 - Carbon Dioxide: 23 mmol/L (ref 17–27)
Calcium: 10.6 mg/dL (ref 8.6–10.6)
Chloride: 103.8 mmol/L (ref 101.0–111.0)
Creatinine: 0.67 mg/dL (ref 0.60–1.30)
Glomerular Filtration Rate Estimate (Female): >60 mL/min/{1.73_m2} (ref >=60.0)
Glucose: 98 mg/dL (ref 74–106)
Osmolality Calculation: 276.9 mOsm/kg (ref 275.0–300.0)
Potassium: 3.4 mmol/L — ABNORMAL LOW (ref 3.50–5.10)
Protein Total: 7.1 g/dL (ref 6.4–8.2)
Sodium: 139 mmol/L (ref 135–145)

## 2017-04-03 LAB — CELLAVISION DIFFERENTIAL
Basophils %: 0 % (ref 0–2)
Basophils, Absolute: 0 10*3/ÂµL (ref 0.0–0.1)
Eosinophils %: 3 % (ref 0–5)
Eosinophils, Absolute: 0.2 10*3/ÂµL (ref 0.0–0.4)
Lymphocytes %: 32 % (ref 20–40)
Lymphocytes, Absolute: 2 10*3/ÂµL (ref 1.0–4.0)
Monocytes %: 3 % (ref 2–12)
Monocytes, Absolute: 0.2 10*3/ÂµL (ref 0.2–1.0)
Neutrophils %: 63 % (ref 50–74)
Neutrophils, Absolute: 3.8 10*3/ÂµL (ref 2.0–7.4)
Platelet Estimate: ADEQUATE
Total Cells Counted: 100
WBC: 6.1 10*3/ÂµL (ref 4.5–11.0)

## 2017-04-03 LAB — CBC WITH AUTO DIFFERENTIAL
HCT: 38.9 % (ref 35.0–48.0)
Hemoglobin: 12.7 g/dL (ref 11.7–16.5)
MCH: 25.9 pg — ABNORMAL LOW (ref 28.3–33.3)
MCHC: 32.7 g/dL (ref 32.5–36.0)
MCV: 79.3 fL — ABNORMAL LOW (ref 81.0–100.0)
MPV: 7.4 fL (ref 6.9–10.0)
Platelet Count: 252 10*3/ÂµL (ref 150–405)
RBC: 4.9 10*6/ÂµL (ref 3.80–5.60)
RDW: 24.9 % — ABNORMAL HIGH (ref 11.7–16.1)
WBC: 6.1 10*3/ÂµL (ref 4.5–11.0)

## 2017-04-03 NOTE — Telephone Encounter (Signed)
Pt called in to request a refill of: atorvastatin (LIPITOR) 20 MG tablet  [facility]  Callback number: [phone]

## 2017-04-03 NOTE — Telephone Encounter (Signed)
Patient is requesting a refill of atorvastatin, last seen by [facility] on 11/15/2016, and no FYI restrictions.  Routed pended order to Robina Ade, MD for review.     Future Appointments  Date Time Provider Department Center   04/03/2017 1:30 PM [facility]ED[facility]        BP Readings from Last 3 Encounters:   12/31/16 150/86   12/20/16 141/84   12/06/16 (!) 142/94     Pulse Readings from Last 3 Encounters:   12/31/16 89   12/20/16 90   12/06/16 93       Last Labs:    LDL Calculated   Date Value Ref Range Status   09/06/2016 69 0 - 129 mg/dL Final     Triglyceride   Date Value Ref Range Status   09/06/2016 118.0 <=150.0 mg/dL Final     Sodium   Date Value Ref Range Status   12/31/2016 140 135 - 145 mmol/L Final     Potassium   Date Value Ref Range Status   12/31/2016 3.76 3.50 - 5.10 mmol/L Final     AST - Aspartate Aminotransferase   Date Value Ref Range Status   12/20/2016 15 5 - 32 IU/L Final     ALT - Alanine Aminotransferase   Date Value Ref Range Status   12/20/2016 13 5 - 33 IU/L Final     BUN   Date Value Ref Range Status   12/31/2016 7 (L) 8 - 20 mg/dL Final     No results found for: HGBA1C, MICROALBUR, MALB24HUR  Glomerular Filtration Rate Estimate   Date Value Ref Range Status   03/06/2016 >60.0 >=60.0 mL/min/1.67m*2 Final     Glomerular Filtration Rate Estimate (Female)   Date Value Ref Range Status   12/31/2016 >60.0 >=60.0 mL/min/1.27m*2 Final

## 2017-04-03 NOTE — Progress Notes (Signed)
[facility]  [place], [place]  MEDICAL ONCOLOGY PROGRESS NOTE    [name]:  Brandy [name], Brandy [name]  CHART#:  [id number] E  ACCT#:  [id number]  [name] DOB:  1955/12/03    DATE OF SERVICE:  04/03/2017.    CC: anemia & colon cancer    Brandy [name] is a 61 year old [name] of Montel Clock, MD, who was diagnosed as having colon cancer several months ago.  Back in 12/2016, when her diagnosis was made, Brandy [name] was markedly anemic with a hemoglobin of 7.2, a depressed MCV and a depressed MCHC.  In Brandy intervening several months, she has been on oral iron, and she is feeling well.  Her blood count today has come from 7.2 to 12.7.  Her hematocrit is 38.9.  Her MCV has come up to 79.3.  Her MCH has come up from 22 to 30.  Her platelet count is normal.  Her differential is normal.  Her electrolytes show a potassium that is depressed at 3.4.  Her BUN is 11.  Her creatinine is 0.67.  Her albumin is 3.8.  Her AST and ALT are normal.  Her bilirubin is normal.  Her alk phos is elevated at 128.    PHYSICAL EXAMINATION:  GENERAL:  Brandy [name] is 5 feet 4 inches.  She weighs 259 pounds.  She is a pleasant-appearing elderly lady who is overweight. Alert and conversive. In no acute distress.   VITAL SIGNS:  Her blood pressure is 138/78.  She is afebrile.  Her pulse is 76.  SKIN:  She has fair skin.  She has a few sores, 1 on her chin and 1 on her right cheek.    HEENT: Pupils equal, round, and reactive. Oropharynx is clear without lesions. Lips without lesions.   NECK:  Without adenopathy.  CHEST:  Clear to auscultation.  CARDIOVASCULAR:  She has a regular rate and rhythm without murmurs.  ABDOMEN:  Soft, obese, without organomegaly. Bowel sounds are normoactive    IMPRESSION:  1.  Colon cancer, stage II - no positive nodes, but a primary that was 3.5 cm in Brandy hepatic flexure, s/p Right hemicolectomy  2.  Iron deficiency anemia - secondary to #1.  3.  A 1.4 cm region in Brandy lung with multiple nodules - exact etiology  unknown.    PLAN:    1.  [name] has 3 sons and her mother had colon cancer, so there is definitely a family history of colon cancer, and I advised [name] last visit and on this visit to contact her sons so they could have a colonoscopy when they get in their 67s.  One son is 44, and Brandy others are in their late-30s.    2.  I think Brandy [name] can decrease her oral iron to 1 a day.  3.  No therapy recommended at this time, we will continue to monitor. She will return to see [place] in 4 months for reevaluation.  We will check a CBC, a CMP and a CEA at that time.    SWEMA:FOXS                       Brandy Raider Deklynn Charlet, MD  D:  04/03/2017 14:17:27            T:  04/03/2017 17:55:15            V/D:  [id number]               E:  ANDER/04/04/2017  08:35:59      C:  Montel Clock MD               1

## 2017-04-04 MED ORDER — atorvastatin (LIPITOR) 20 MG tablet
20 | ORAL_TABLET | Freq: Every evening | ORAL | 1 refills | Status: DC
Start: 2017-04-04 — End: 2017-09-30

## 2017-07-18 ENCOUNTER — Other Ambulatory Visit: Admit: 2017-07-18

## 2017-07-18 ENCOUNTER — Ambulatory Visit: Admit: 2017-07-18 | Discharge: 2017-08-31

## 2017-07-18 DIAGNOSIS — Z08 Encounter for follow-up examination after completed treatment for malignant neoplasm: Secondary | ICD-10-CM

## 2017-07-18 DIAGNOSIS — C183 Malignant neoplasm of hepatic flexure: Secondary | ICD-10-CM

## 2017-07-18 LAB — CBC WITH AUTO DIFFERENTIAL
Basophils %: 1 % (ref 0–2)
Basophils, Absolute: 0 10*3/ÂµL (ref 0.0–0.1)
Eosinophils %: 2 % (ref 0–5)
Eosinophils, Absolute: 0.1 10*3/ÂµL (ref 0.0–0.4)
HCT: 44.5 % (ref 35.0–48.0)
Hemoglobin: 14.7 g/dL (ref 11.7–16.5)
Lymphocytes %: 31 % (ref 20–40)
Lymphocytes, Absolute: 2.1 10*3/ÂµL (ref 1.0–4.0)
MCH: 30.1 pg (ref 28.3–33.3)
MCHC: 33.1 g/dL (ref 32.5–36.0)
MCV: 90.7 fL (ref 81.0–100.0)
MPV: 7.5 fL (ref 6.9–10.0)
Monocytes %: 8 % (ref 2–12)
Monocytes, Absolute: 0.5 10*3/ÂµL (ref 0.2–1.0)
Neutrophils %: 59 % (ref 50–74)
Neutrophils, Absolute: 4 10*3/ÂµL (ref 2.0–7.4)
Platelet Count: 233 10*3/ÂµL (ref 150–405)
RBC: 4.9 10*6/ÂµL (ref 3.80–5.60)
RDW: 15.4 % (ref 11.7–16.1)
WBC: 6.8 10*3/ÂµL (ref 4.5–11.0)

## 2017-07-18 LAB — COMPREHENSIVE METABOLIC PANEL
ALT - Alanine Aminotransferase: 20 IU/L (ref 5–33)
AST - Aspartate Aminotransferase: 21 IU/L (ref 5–32)
Albumin/Globulin Ratio: 1.2 (ref 1.2–2.2)
Albumin: 4.3 g/dL (ref 3.5–5.0)
Alkaline Phosphatase: 144 IU/L — ABNORMAL HIGH (ref 35–105)
Anion Gap: 13.8 mmol/L (ref 12.0–20.0)
BUN / Creatinine Ratio: 17.1 (ref 12.0–20.0)
BUN: 13 mg/dL (ref 8–20)
Bilirubin Total: 0.4 mg/dL (ref 0.10–1.70)
CO2 - Carbon Dioxide: 23 mmol/L (ref 17–27)
Calcium: 11 mg/dL — ABNORMAL HIGH (ref 8.6–10.6)
Chloride: 107 mmol/L (ref 101.0–111.0)
Creatinine: 0.76 mg/dL (ref 0.60–1.30)
Glomerular Filtration Rate Estimate (Female): >60 mL/min/{1.73_m2} (ref >=60.0)
Glucose: 79 mg/dL (ref 74–106)
Osmolality Calculation: 278.4 mOsm/kg (ref 275.0–300.0)
Potassium: 3.78 mmol/L (ref 3.50–5.10)
Protein Total: 7.8 g/dL (ref 6.4–8.2)
Sodium: 140 mmol/L (ref 135–145)

## 2017-07-18 LAB — CEA: CEA: 1.1 ng/mL (ref 0.0–5.0)

## 2017-07-18 NOTE — Progress Notes (Signed)
[facility]  [place], [place]  MEDICAL ONCOLOGY PROGRESS NOTE    [name], Brandy [name]  CHART#:  [id number] E  ACCT#:  [id number]  PATIENT DOB:  04-03-1956    DATE OF SERVICE:  07/18/2017.    CC: back pain, hx of colon cancer & anemia    HISTORY OF PRESENT ILLNESS:  Brandy [name] states that she has developed a pain in her back.  She feels it over the thoracic spine.  It is about probably around T11.  There is no vertebral compression tenderness, and it is there at night, it is there during the day.  She thinks that maybe carrying wood in for the stove probably makes it a little bit worse, but she is not sure.  She is on various medications and her doctor told her not to take Aleve or any blood thinners.  She is not taking Coumadin.  From looking at the patient's medicines, she is on atorvastatin, carvedilol, baby aspirin.  I think she could take Aleve, but if her doctor told her not to, we will just hold off on that.  The patient thinks the pain in her back has been present for at least 5 or 6 weeks and seems to be getting worse.    REVIEW OF SYSTEMS:  As in HPI    PHYSICAL EXAMINATION:    GENERAL:  The patient is 5 feet 3-1/2 inches.  She weighs 261 pounds.  When last seen, she was 258, so her weight is up 3 pounds.  She is alert and conversive.  VITAL SIGNS:  Her blood pressure is 137/79.  Her temperature is 97.  Her pulse is 76 and regular.    HEENT: Head is normocephalic.  The patient has long hair.  It is in a ponytail, somewhat graying. Eyes non icteric.  She has fair skin. Lips without lesions.   NECK:  Without adenopathy.  LUNGS:  Clear.  BACK:  She has no vertebral body compression tenderness.    CARDIOVASCULAR:  She has a regular rate and rhythm without murmurs.   AXILLAE:  Without adenopathy.  BREASTS:  Large and symmetrical.  ABDOMEN:  Soft, obese without organomegaly.  EXTREMITIES:  Well-developed, without lesions.    LABORATORIES, IMAGING AND ELECTRONIC RECORD:  Patient's white count  is 6.8.  Her hemoglobin is 14.7.  She has 59% granulocytes.  Her platelet count is 233,000.  Her electrolytes are normal.  Her calcium is elevated at 11.  Her BUN is normal at 13.  Her creatinine is 0.76.  Her albumin is 4.3.  Her total protein is 7.8.  Her liver function tests are all normal except for an alk phos that is elevated at 144, upper limits of normal I think about 95.    IMPRESSION:    1.  Cancer of the colon, T3 tumor, penetrating through the wall into the pericolonic tissues - there was no lymph node involvement or extracolonic implants.  Patient is now 5 months post-surgery.  2.  Iron deficiency anemia secondary to #1 - resolved.  3.  Post-porcine aortic valve transplant 1-1/2 years ago.    PLAN:  Get a CT scan of the chest, abdomen and pelvis, and she will return to see [place] in 2 weeks.    SWEMA:FOXS                       Brandy Raider Dejaun Vidrio, MD  D:  07/18/2017 15:11:41  T:  07/18/2017 17:50:13            V/D:  [id number]               E:  ANDER/07/19/2017 08:43:26      C:  Robina Ade MD      Montel Clock MD      Fortunato Curling MD               1

## 2017-07-27 ENCOUNTER — Ambulatory Visit

## 2017-08-02 ENCOUNTER — Other Ambulatory Visit: Admit: 2017-08-02

## 2017-08-02 DIAGNOSIS — C183 Malignant neoplasm of hepatic flexure: Secondary | ICD-10-CM

## 2017-08-02 LAB — COMPREHENSIVE METABOLIC PANEL
ALT - Alanine Aminotransferase: 21 IU/L (ref 5–33)
AST - Aspartate Aminotransferase: 20 IU/L (ref 5–32)
Albumin/Globulin Ratio: 1.2 (ref 1.2–2.2)
Albumin: 4.4 g/dL (ref 3.5–5.0)
Alkaline Phosphatase: 156 IU/L — ABNORMAL HIGH (ref 35–105)
Anion Gap: 16 mmol/L (ref 12.0–20.0)
BUN / Creatinine Ratio: 18.1 (ref 12.0–20.0)
BUN: 13 mg/dL (ref 8–20)
Bilirubin Total: 0.6 mg/dL (ref 0.10–1.70)
CO2 - Carbon Dioxide: 24 mmol/L (ref 17–27)
Calcium: 11.2 mg/dL — ABNORMAL HIGH (ref 8.6–10.6)
Chloride: 106 mmol/L (ref 101.0–111.0)
Creatinine: 0.72 mg/dL (ref 0.60–1.30)
Glomerular Filtration Rate Estimate (Female): >60 mL/min/{1.73_m2} (ref >=60.0)
Glucose: 90 mg/dL (ref 74–106)
Osmolality Calculation: 282.8 mOsm/kg (ref 275.0–300.0)
Potassium: 4.02 mmol/L (ref 3.50–5.10)
Protein Total: 8.2 g/dL (ref 6.4–8.2)
Sodium: 142 mmol/L (ref 135–145)

## 2017-08-02 LAB — CBC WITH AUTO DIFFERENTIAL
Basophils %: 1 % (ref 0–2)
Basophils, Absolute: 0.1 10*3/ÂµL (ref 0.0–0.1)
Eosinophils %: 1 % (ref 0–5)
Eosinophils, Absolute: 0.1 10*3/ÂµL (ref 0.0–0.4)
HCT: 44.7 % (ref 35.0–48.0)
Hemoglobin: 15 g/dL (ref 11.7–16.5)
Lymphocytes %: 33 % (ref 20–40)
Lymphocytes, Absolute: 2.3 10*3/ÂµL (ref 1.0–4.0)
MCH: 30.3 pg (ref 28.3–33.3)
MCHC: 33.4 g/dL (ref 32.5–36.0)
MCV: 90.5 fL (ref 81.0–100.0)
MPV: 7.6 fL (ref 6.9–10.0)
Monocytes %: 7 % (ref 2–12)
Monocytes, Absolute: 0.5 10*3/ÂµL (ref 0.2–1.0)
Neutrophils %: 58 % (ref 50–74)
Neutrophils, Absolute: 4 10*3/ÂµL (ref 2.0–7.4)
Platelet Count: 274 10*3/ÂµL (ref 150–405)
RBC: 4.94 10*6/ÂµL (ref 3.80–5.60)
RDW: 15.1 % (ref 11.7–16.1)
WBC: 6.9 10*3/ÂµL (ref 4.5–11.0)

## 2017-08-03 DIAGNOSIS — Z85038 Personal history of other malignant neoplasm of large intestine: Secondary | ICD-10-CM

## 2017-08-04 ENCOUNTER — Inpatient Hospital Stay: Admit: 2017-08-04 | Discharge: 2017-08-16 | Attending: Hematology & Oncology

## 2017-08-04 MED ORDER — iopamidol (ISOVUE-300) 61 % injection 100 mL
300 | Freq: Once | INTRAVENOUS | Status: AC
Start: 2017-08-04 — End: 2017-08-03
  Administered 2017-08-04: 01:00:00 300 mL via INTRAVENOUS

## 2017-08-14 ENCOUNTER — Ambulatory Visit: Admit: 2017-08-14 | Discharge: 2017-09-04

## 2017-08-14 DIAGNOSIS — Z08 Encounter for follow-up examination after completed treatment for malignant neoplasm: Secondary | ICD-10-CM

## 2017-08-14 NOTE — Progress Notes (Signed)
[facility]  [place], [place]  MEDICAL ONCOLOGY PROGRESS NOTE    [name], Brandy [name]  CHART#:  [id number] E  ACCT#:  [id number]  PATIENT DOB:  [birth year]    DATE OF SERVICE:  08/14/2017.    CC: Back pain, hx of colon cancer     HISTORY OF PRESENT ILLNESS:  Brandy [name] came in to see [place] because of severe back pain.  We did a CT scan of the chest, abdomen and pelvis.  There is no evidence of recurrent colon cancer.  Patient continues to have the back pain.  She states she had whiplash as a young girl, but that was up in her neck and this pain is felt over her thoracic spine and she also has some over her lower lumbar area.  The CT scan of the chest was completely normal.  There was an area in the left upper lobe (a nodular area) that is stable compared with 12/14/2016 and suggests post-inflammatory changes.  The heart, the pericardium, the mediastinum, the chest wall and the pulmonary parenchyma are all normal.  Patient has a 3.6 cm hiatal hernia.  Her right colon was resected for her colon cancer.  There were no abnormalities within the abdomen.  In the peritoneum there is no ascites or free air.  The liver is without lesions.  The gallbladder is without gallstones.  The spleen is normal.  The kidneys are normal.  The retroperitoneum and abdominal wall are normal as is the pelvis.  The patient states that the pain bothers her and she takes Tylenol with minimal relief.  She has not been able to do much work in the ensuing 3 weeks.  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  They have lived out there for about 20 years.  Patient states that almost any movement or working aggravates the pain, so she has been taking the Tylenol but still she feels it. She is very appreciative that she does not have recurrent colon cancer but she is still disappointed that we have not found out what is causing her significant back pain.      REVIEW OF  SYSTEMS:  As in HPI    PHYSICAL EXAMINATION:  VITAL SIGNS:  Her blood pressure is 137/79, her temperature is 97, her pulse is 73 and regular.  The patient is 5 feet 3 inches.  She is 261 pounds.  She appears her stated age of 79 years.  SKIN:  Her skin is quite fair and flushed.  NECK:  Without adenopathy.  CHEST:  Clear.  There is point tenderness from the T9 to the T12 area, and this is actually point tenderness.  The patient has some discomfort at the L3 and L2 area to palpation.    AXILLAE:  The axillae are without adenopathy.    BREASTS:  The breasts are full without masses.    ABDOMEN:  Her abdomen is obese without organomegaly.      LABORATORIES, IMAGING AND ELECTRONIC RECORD:  Again, the CT scan of the chest, abdomen and pelvis was negative.  There is no evidence of any type of disease.  Her white count is normal.  Her hemoglobin is normal.  Her electrolytes are normal.  Her calcium is elevated at 11.2.      IMPRESSION:    1. Continued back pain- I think she may well have primary hypercalcemia or primary hyperparathyroidism.  Her alk phos is elevated.  2. Colon cancer- NED    PLAN:  We will go ahead and get x-rays of the lumbar spine.  We will get a DEXA scan to evaluate her for osteoporosis.  We will check a calcium and parathormone level at the same time.  She will return to see [place] in 2 weeks.  We will also give her a prescription to take calcium and vitamin D.  We will give her a prescription for tramadol 50 mg 1 t.i.d. for the pain.    SWEMA:LESLD                      Brandy Raider Daija Routson, MD  D:  08/14/2017 10:57:17            T:  08/15/2017 07:56:36            V/D:  [id number]               E:  ANDER/08/15/2017 10:57:42                     1

## 2017-08-15 ENCOUNTER — Inpatient Hospital Stay: Admit: 2017-08-15 | Discharge: 2017-08-29 | Attending: Hematology & Oncology

## 2017-08-15 ENCOUNTER — Other Ambulatory Visit: Admit: 2017-08-15

## 2017-08-15 ENCOUNTER — Inpatient Hospital Stay: Admit: 2017-08-15 | Attending: Hematology & Oncology

## 2017-08-15 DIAGNOSIS — C183 Malignant neoplasm of hepatic flexure: Secondary | ICD-10-CM

## 2017-08-15 DIAGNOSIS — Z780 Asymptomatic menopausal state: Secondary | ICD-10-CM

## 2017-08-15 LAB — COMPREHENSIVE METABOLIC PANEL
ALT - Alanine Aminotransferase: 18 IU/L (ref 5–33)
AST - Aspartate Aminotransferase: 19 IU/L (ref 5–32)
Albumin/Globulin Ratio: 1.2 (ref 1.2–2.2)
Albumin: 4.3 g/dL (ref 3.5–5.0)
Alkaline Phosphatase: 148 IU/L — ABNORMAL HIGH (ref 35–105)
Anion Gap: 16.7 mmol/L (ref 12.0–20.0)
BUN / Creatinine Ratio: 20.3 — ABNORMAL HIGH (ref 12.0–20.0)
BUN: 15 mg/dL (ref 8–20)
Bilirubin Total: 0.5 mg/dL (ref 0.10–1.70)
CO2 - Carbon Dioxide: 22 mmol/L (ref 17–27)
Calcium: 10.9 mg/dL — ABNORMAL HIGH (ref 8.6–10.6)
Chloride: 107 mmol/L (ref 101.0–111.0)
Creatinine: 0.74 mg/dL (ref 0.60–1.30)
Glomerular Filtration Rate Estimate (Female): >60 mL/min/{1.73_m2} (ref >=60.0)
Glucose: 86 mg/dL (ref 74–106)
Osmolality Calculation: 283.3 mOsm/kg (ref 275.0–300.0)
Potassium: 3.7 mmol/L (ref 3.50–5.10)
Protein Total: 7.9 g/dL (ref 6.4–8.2)
Sodium: 142 mmol/L (ref 135–145)

## 2017-08-15 LAB — PTH, INTACT: PTH: 223 pg/mL — ABNORMAL HIGH (ref 12.0–88.0)

## 2017-08-29 NOTE — Progress Notes (Signed)
[facility]  [facility], [place]  MEDICAL ONCOLOGY PROGRESS NOTE    [name], Brandy [name]  CHART#:  [id number] E  ACCT#:  [id number]  PATIENT DOB:  [birth year]    DATE OF SERVICE:  08/29/2017.    CC: colon cancer and anemia    HISTORY OF PRESENT ILLNESS:  Brandy [name] is a 62 year old lady who we are following for anemia and she is postop for stage II colon cancer.  Patient has been on oral iron since 12/2016 when she was diagnosed with colon cancer and was markedly anemic with a hemoglobin of 7.  Patient's hemoglobin today has risen to 15 and I think it will be reasonable to discontinue the oral iron.  Her electrolytes are normal.  Her calcium has been for the last several months elevated at 11, 11.2 and 10.9; upper limits of normal for our lab being 10.6.  With a calcium of 10.9 the patient's parathormone level was elevated at 223.  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  She has not only had the elevated parathormone level she also has an elevated alk phos and this has been up for at least 5 months.  Patient's DEXA scan shows that she has mostly normal bone mineral density.  She is very active chopping wood, fixing fences and her weight is very much elevated at 264 pounds at a height of 5 feet 3 inches.  Patient states she has always worked hard usually outside and also inside.    REVIEW OF SYSTEMS:  Negative for fever, chills, chest pain, shortness of breath.     PHYSICAL EXAMINATION:  VITAL SIGNS:  Blood pressure is 139/76, her temperature is 97, pulse is 76 and regular and her height is 5 feet 3 inches and she is 264 pounds.    HEENT:  Head is normocephalic. She has gray hair in a ponytail.  Eye non icteric. She has some rosacea on her cheeks bilaterally with some telangiectasias.  NECK:  Without adenopathy.  I can feel no abnormalities in the neck.   CHEST:  Clear to auscultation.   AXILLAE:  The axillae are without adenopathy.    ABDOMEN:  Soft, obese and without organomegaly.  Bowel sounds are normoactive.     LABORATORIES, IMAGING AND ELECTRONIC RECORD:  Her electrolytes are normal.  Her calcium is elevated.  Her BUN is 15, creatinine 0.74.  Her total protein is normal.  Her albumin is 4.3.  Her liver function tests are all normal except for her alk phos, which is elevated, which I think might be just from the bone.  Her parathormone is elevated.  Her creatinine clearance is greater than 60 mL per minute.  Her bone mineral density is within normal limits.    IMPRESSION:  1.  Colon cancer, well-differentiated, negative nodes, T3 lesion, stage 2, post-hemicolectomy.  2.  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  3.  Exogenous obesity.  4.  Suspect primary hyperparathyroidism with elevated calcium, elevated parathormone levels and elevated alkaline phosphatase with some back pain.      PLAN:  See the patient back in 4 months for reevaluation.  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    SWEMA:LESLD                      Rhona Raider Kiernan Atkerson, MD  D:  08/29/2017 40:98:11  T:  08/29/2017 13:49:23            V/D:  [id number]               E:  ANDER/08/30/2017 09:04:03      C:  Robina Ade MD               1

## 2017-10-01 NOTE — Telephone Encounter (Signed)
Patient is calling in to check on this request, she states she is all out.

## 2017-10-03 ENCOUNTER — Other Ambulatory Visit: Admit: 2017-10-03

## 2017-10-03 DIAGNOSIS — E212 Other hyperparathyroidism: Secondary | ICD-10-CM

## 2017-10-03 LAB — COMPREHENSIVE METABOLIC PANEL
ALT - Alanine Aminotransferase: 24 IU/L (ref 5–33)
AST - Aspartate Aminotransferase: 24 IU/L (ref 5–32)
Albumin/Globulin Ratio: 1.1 — ABNORMAL LOW (ref 1.2–2.2)
Albumin: 4.2 g/dL (ref 3.5–5.0)
Alkaline Phosphatase: 159 IU/L — ABNORMAL HIGH (ref 35–105)
Anion Gap: 14.1 mmol/L (ref 12.0–20.0)
BUN / Creatinine Ratio: 16.7 (ref 12.0–20.0)
BUN: 13 mg/dL (ref 8–20)
Bilirubin Total: 0.5 mg/dL (ref 0.10–1.70)
CO2 - Carbon Dioxide: 25 mmol/L (ref 17–27)
Calcium: 10.9 mg/dL — ABNORMAL HIGH (ref 8.6–10.6)
Chloride: 103.7 mmol/L (ref 101.0–111.0)
Creatinine: 0.78 mg/dL (ref 0.60–1.30)
Glomerular Filtration Rate Estimate (Female): >60 mL/min/{1.73_m2} (ref >=60.0)
Glucose: 95 mg/dL (ref 74–106)
Osmolality Calculation: 277.5 mOsm/kg (ref 275.0–300.0)
Potassium: 3.78 mmol/L (ref 3.50–5.10)
Protein Total: 8 g/dL (ref 6.4–8.2)
Sodium: 139 mmol/L (ref 135–145)

## 2017-10-03 LAB — 25-HYDROXYVITAMIN D, LC/MS/MS (MAYO)
25-Hydroxy D Total: 14 ng/mL — ABNORMAL LOW
25-Hydroxy D2: <4 ng/mL
25-Hydroxy D3: 14 ng/mL

## 2017-10-03 LAB — PTH, INTACT: PTH: 191.6 pg/mL — ABNORMAL HIGH (ref 12.0–88.0)

## 2017-10-03 LAB — IONIZED CALCIUM: Calcium, Ionized: 6 mg/dL (ref 4.50–5.30)

## 2017-10-03 LAB — PHOSPHORUS: Phosphorus: 2.4 mg/dL — ABNORMAL LOW (ref 2.5–4.7)

## 2017-10-04 ENCOUNTER — Other Ambulatory Visit: Admit: 2017-10-04

## 2017-10-04 DIAGNOSIS — E212 Other hyperparathyroidism: Secondary | ICD-10-CM

## 2017-10-04 LAB — CALCIUM, URINE, 24 HOUR
Calcium 24 Hour Urine: 85.2 mg/24 Hours — ABNORMAL LOW (ref 100.0–300.0)
Collection Duration Urine: 24 Hours
Raw Urine Calcium: 21.3 mg/dL
Volume, Urine, 24 Hour: 400 mL/24 hours

## 2017-10-04 NOTE — Telephone Encounter (Signed)
Patient called in checking on status of this request. Pt states she is all out.

## 2017-10-04 NOTE — Telephone Encounter (Signed)
Patient is requesting a refill of atorvastatin, last seen by [facility] on 11/16/2016, and no FYI restrictions.  Routed pended order to Robina Ade, MD for review.   No future appointments.     BP Readings from Last 3 Encounters:   12/31/16 150/86   12/20/16 141/84   12/06/16 (!) 142/94     Pulse Readings from Last 3 Encounters:   12/31/16 89   12/20/16 90   12/06/16 93       Last Labs:    LDL Calculated   Date Value Ref Range Status   09/06/2016 69 0 - 129 mg/dL Final     Triglyceride   Date Value Ref Range Status   09/06/2016 118.0 <=150.0 mg/dL Final     Sodium   Date Value Ref Range Status   10/03/2017 139 135 - 145 mmol/L Final     Potassium   Date Value Ref Range Status   10/03/2017 3.78 3.50 - 5.10 mmol/L Final     AST - Aspartate Aminotransferase   Date Value Ref Range Status   10/03/2017 24 5 - 32 IU/L Final     ALT - Alanine Aminotransferase   Date Value Ref Range Status   10/03/2017 24 5 - 33 IU/L Final     BUN   Date Value Ref Range Status   10/03/2017 13 8 - 20 mg/dL Final     No results found for: HGBA1C, MICROALBUR, MALB24HUR  Glomerular Filtration Rate Estimate   Date Value Ref Range Status   03/06/2016 >60.0 >=60.0 mL/min/1.53m*2 Final     Glomerular Filtration Rate Estimate (Female)   Date Value Ref Range Status   10/03/2017 >60.0 >=60.0 mL/min/1.71m*2 Final

## 2017-10-29 NOTE — Telephone Encounter (Signed)
Patient is requesting a refill of carvedilol, last seen by [facility] on 11/16/2016 , and no FYI restrictions.  Routed pended order to Robina Ade, MD for review.   No future appointments.     BP Readings from Last 3 Encounters:   12/31/16 150/86   12/20/16 141/84   12/06/16 (!) 142/94     Pulse Readings from Last 3 Encounters:   12/31/16 89   12/20/16 90   12/06/16 93       Last Labs:    LDL Calculated   Date Value Ref Range Status   09/06/2016 69 0 - 129 mg/dL Final     Triglyceride   Date Value Ref Range Status   09/06/2016 118.0 <=150.0 mg/dL Final     Sodium   Date Value Ref Range Status   10/03/2017 139 135 - 145 mmol/L Final     Potassium   Date Value Ref Range Status   10/03/2017 3.78 3.50 - 5.10 mmol/L Final     AST - Aspartate Aminotransferase   Date Value Ref Range Status   10/03/2017 24 5 - 32 IU/L Final     ALT - Alanine Aminotransferase   Date Value Ref Range Status   10/03/2017 24 5 - 33 IU/L Final     BUN   Date Value Ref Range Status   10/03/2017 13 8 - 20 mg/dL Final     No results found for: HGBA1C, MICROALBUR, MALB24HUR  Glomerular Filtration Rate Estimate   Date Value Ref Range Status   03/06/2016 >60.0 >=60.0 mL/min/1.68m*2 Final     Glomerular Filtration Rate Estimate (Female)   Date Value Ref Range Status   10/03/2017 >60.0 >=60.0 mL/min/1.53m*2 Final

## 2017-10-29 NOTE — Telephone Encounter (Signed)
Pt called in to request a refill of: carvedilol (COREG) 6.25 MG tablet  Pharmacy: [id number] - [place], [place] - [address]  Callback number: [phone]

## 2017-10-31 MED ORDER — carvedilol (COREG) 6.25 MG tablet
6.25 | ORAL_TABLET | Freq: Two times a day (BID) | ORAL | 0 refills | 90.00000 days | Status: DC
Start: 2017-10-31 — End: 2018-01-01

## 2017-11-21 ENCOUNTER — Other Ambulatory Visit: Admit: 2017-11-21

## 2017-11-21 DIAGNOSIS — E210 Primary hyperparathyroidism: Secondary | ICD-10-CM

## 2017-11-21 LAB — IONIZED CALCIUM: Calcium, Ionized: 6.1 mg/dL (ref 4.50–5.30)

## 2017-11-21 LAB — COMPREHENSIVE METABOLIC PANEL
ALT - Alanine Aminotransferase: 20 IU/L (ref 5–33)
AST - Aspartate Aminotransferase: 22 IU/L (ref 5–32)
Albumin/Globulin Ratio: 1.1 — ABNORMAL LOW (ref 1.2–2.2)
Albumin: 4.2 g/dL (ref 3.5–5.0)
Alkaline Phosphatase: 144 IU/L — ABNORMAL HIGH (ref 35–105)
Anion Gap: 20.4 mmol/L — ABNORMAL HIGH (ref 12.0–20.0)
BUN / Creatinine Ratio: 18.4 (ref 12.0–20.0)
BUN: 16 mg/dL (ref 8–20)
Bilirubin Total: 0.5 mg/dL (ref 0.10–1.70)
CO2 - Carbon Dioxide: 21 mmol/L (ref 17–27)
Calcium: 11.4 mg/dL — ABNORMAL HIGH (ref 8.6–10.6)
Chloride: 107.9 mmol/L (ref 101.0–111.0)
Creatinine: 0.87 mg/dL (ref 0.60–1.30)
Glomerular Filtration Rate Estimate (Female): >60 mL/min/{1.73_m2} (ref >=60.0)
Glucose: 85 mg/dL (ref 74–106)
Osmolality Calculation: 289.1 mOsm/kg (ref 275.0–300.0)
Potassium: 4.25 mmol/L (ref 3.50–5.10)
Protein Total: 8.1 g/dL (ref 6.4–8.2)
Sodium: 145 mmol/L (ref 135–145)

## 2017-11-21 LAB — PTH, INTACT: PTH: 157.6 pg/mL — ABNORMAL HIGH (ref 12.0–88.0)

## 2017-11-21 LAB — 25-HYDROXYVITAMIN D, IMMUNOASSAY (SLM): Vitamin D, 25-OH Total: 13.4 ng/mL — ABNORMAL LOW

## 2017-11-21 LAB — PHOSPHORUS: Phosphorus: 2.7 mg/dL (ref 2.5–4.7)

## 2017-11-21 LAB — TSH: TSH - Thyroid Stimulating Hormone: 2.28 u[IU]/mL (ref 0.34–5.60)

## 2017-11-22 ENCOUNTER — Other Ambulatory Visit: Admit: 2017-11-22

## 2017-11-22 DIAGNOSIS — E210 Primary hyperparathyroidism: Secondary | ICD-10-CM

## 2017-11-22 LAB — CALCIUM, URINE, 24 HOUR
Calcium 24 Hour Urine: 248.3 mg/24 Hours (ref 100.0–300.0)
Collection Duration Urine: 24 Hours
Raw Urine Calcium: 33.1 mg/dL
Volume, Urine, 24 Hour: 750 mL/24 hours

## 2017-12-04 ENCOUNTER — Ambulatory Visit: Admit: 2017-12-04 | Discharge: 2018-01-16

## 2017-12-04 ENCOUNTER — Other Ambulatory Visit: Admit: 2017-12-04

## 2017-12-04 DIAGNOSIS — C183 Malignant neoplasm of hepatic flexure: Secondary | ICD-10-CM

## 2017-12-04 DIAGNOSIS — C189 Malignant neoplasm of colon, unspecified: Secondary | ICD-10-CM

## 2017-12-04 LAB — COMPREHENSIVE METABOLIC PANEL
ALT - Alanine Aminotransferase: 19 IU/L (ref 5–33)
AST - Aspartate Aminotransferase: 21 IU/L (ref 5–32)
Albumin/Globulin Ratio: 1.2 (ref 1.2–2.2)
Albumin: 4 g/dL (ref 3.5–5.0)
Alkaline Phosphatase: 149 IU/L — ABNORMAL HIGH (ref 35–105)
Anion Gap: 15.9 mmol/L (ref 12.0–20.0)
BUN / Creatinine Ratio: 13 (ref 12.0–20.0)
BUN: 10 mg/dL (ref 8–20)
Bilirubin Total: 0.3 mg/dL (ref 0.10–1.70)
CO2 - Carbon Dioxide: 23 mmol/L (ref 17–27)
Calcium: 11.3 mg/dL — ABNORMAL HIGH (ref 8.6–10.6)
Chloride: 104.7 mmol/L (ref 101.0–111.0)
Creatinine: 0.77 mg/dL (ref 0.60–1.30)
Glomerular Filtration Rate Estimate (Female): >60 mL/min/{1.73_m2} (ref >=60.0)
Glucose: 123 mg/dL — ABNORMAL HIGH (ref 74–106)
Osmolality Calculation: 279.8 mOsm/kg (ref 275.0–300.0)
Potassium: 3.57 mmol/L (ref 3.50–5.10)
Protein Total: 7.4 g/dL (ref 6.4–8.2)
Sodium: 140 mmol/L (ref 135–145)

## 2017-12-04 LAB — CBC WITH AUTO DIFFERENTIAL
Basophils %: 1 % (ref 0–2)
Basophils, Absolute: 0 10*3/ÂµL (ref 0.0–0.1)
Eosinophils %: 2 % (ref 0–5)
Eosinophils, Absolute: 0.1 10*3/ÂµL (ref 0.0–0.4)
HCT: 40.8 % (ref 35.0–48.0)
Hemoglobin: 13.6 g/dL (ref 11.7–16.5)
Lymphocytes %: 42 % — ABNORMAL HIGH (ref 20–40)
Lymphocytes, Absolute: 2.3 10*3/ÂµL (ref 1.0–4.0)
MCH: 31.6 pg (ref 28.3–33.3)
MCHC: 33.4 g/dL (ref 32.5–36.0)
MCV: 94.7 fL (ref 81.0–100.0)
MPV: 7.6 fL (ref 6.9–10.0)
Monocytes %: 7 % (ref 2–12)
Monocytes, Absolute: 0.4 10*3/ÂµL (ref 0.2–1.0)
Neutrophils %: 49 % — ABNORMAL LOW (ref 50–74)
Neutrophils, Absolute: 2.7 10*3/ÂµL (ref 2.0–7.4)
Platelet Count: 226 10*3/ÂµL (ref 150–405)
RBC: 4.31 10*6/ÂµL (ref 3.80–5.60)
RDW: 15.4 % (ref 11.7–16.1)
WBC: 5.5 10*3/ÂµL (ref 4.5–11.0)

## 2017-12-04 LAB — CEA: CEA: 1.5 ng/mL (ref 0.0–5.0)

## 2017-12-04 NOTE — Progress Notes (Signed)
[facility]  [place], [place]  MEDICAL ONCOLOGY PROGRESS NOTE    PATIENT:  Brandy, [name]  CHART#:  [id number]  ACCT#:  [id number]  PATIENT DOB:  1956/03/07    DATE OF SERVICE:  12/04/2017.    CC: Hx of colon cancer    HISTORY OF PRESENT ILLNESS:   The patient saw P. Larae Grooms, MD, in [place] for her hyperparathyroidism.  They did an ultrasound and this revealed a mass in the inferior pole of the right thyroid.  The patient will be 1-year post-surgical resection for colon cancer with negative nodes next month.  She will need a screening colonoscopy done in the first part of 01/2018.  Fortunato Curling, MD, did her first colonoscopy and discovered the cancer and we will refer the patient back to see Fortunato Curling, MD.  Brandy [name] is doing well.  She is busy working outside and indoors as well.  She was encouraged to wear a wide-brimmed hat and long sleeves when working outdoors.      REVIEW OF SYSTEMS: No new complaints.     PHYSICAL EXAMINATION:    VITAL SIGNS:  The patient is 5 feet 3 inches.  Her weight is stable at 263.  She is asymptomatic.  Her blood pressure is 134/78, her temperature is 97, her pulse is 70 and regular, her O2 sats are 96% on room air.    HEENT:  Her face has some erythema on both cheeks.  NECK:  Without adenopathy.  CHEST:  Clear.  BREASTS:  Large and symmetrical, without masses.  AXILLAE:  Without adenopathy.  CARDIOVASCULAR:  She has a regular rate and rhythm without murmurs.  ABDOMEN:  Soft, without organomegaly or tenderness.    LABORATORIES, IMAGING AND ELECTRONIC RECORD:  Patient's calcium today is elevated at 11.3.  Her BUN is 10, her creatinine is 0.7.  Her electrolytes are normal.  Her hemoglobin is 13.6, her hematocrit is 40.8.  Her MCV and MCH are normal.  Her albumin is 4, total protein 7.4/normal.  Her AST, ALT and bilirubin are normal but her alk phos is elevated at 149.    IMPRESSION:  1.  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  2.  Iron deficiency anemia, resolved.  3.  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      PLAN:  The patient will return to see [place] in 4 months for reevaluation.  She is doing quite well.  She is going to see P. Larae Grooms, MD, in another week or 2 and probably have the nodule in the thyroid resected.  We will see her in the fall of 2019.    SWEMA:LESLD                      Rhona Raider Fleet Higham, MD  D:  12/04/2017 15:10:40            T:  12/04/2017 16:22:07            V/D:  [id number]               E:  ANDER/12/05/2017 08:24:04      C:  Robina Ade MD      P. Larae Grooms MD      Fortunato Curling MD               1

## 2017-12-20 MED ORDER — sodium sulfate-potassium sulfate-magnesium sulfate (SUPREP BOWEL PREP KIT) solution
17.5-3.13-1.6 | Freq: Once | ORAL | 0 refills | Status: AC
Start: 2017-12-20 — End: 2017-12-21

## 2017-12-20 NOTE — Telephone Encounter (Signed)
I called patient back and she has agreed to have procedure done on 01/15/18. Bowel prep will be send to [facility]. Address was confirmed for her written set of instructions to be mailed to. RM

## 2017-12-20 NOTE — Telephone Encounter (Signed)
Patient called to schedule colonoscopy I let her know that our CMA was on the phone with another patient and she would call her back. Patient was understanding.

## 2017-12-20 NOTE — Addendum Note (Signed)
Addended by: Torrie Mayers on: 12/20/2017 04:37 PM     Modules accepted: Orders

## 2017-12-20 NOTE — Telephone Encounter (Signed)
Left message for patient to call back to schedule her 1-year surveillance colonoscopy. RM

## 2017-12-31 NOTE — Telephone Encounter (Signed)
Patient is requesting a refill of atorvastatin, last seen by [facility] on 11/16/2016, and no FYI restrictions.  Patient is due for an appointment.  Routed to the front desk to contact patient and schedule an appointment.  Routed pended order to Acquanetta Chain, DO for review.   No future appointments.     BP Readings from Last 3 Encounters:   12/31/16 150/86   12/20/16 141/84   12/06/16 (!) 142/94     Pulse Readings from Last 3 Encounters:   12/31/16 89   12/20/16 90   12/06/16 93       Last Labs:    LDL Calculated   Date Value Ref Range Status   09/06/2016 69 0 - 129 mg/dL Final     Triglyceride   Date Value Ref Range Status   09/06/2016 118.0 <=150.0 mg/dL Final     Sodium   Date Value Ref Range Status   12/04/2017 140 135 - 145 mmol/L Final     Potassium   Date Value Ref Range Status   12/04/2017 3.57 3.50 - 5.10 mmol/L Final     AST - Aspartate Aminotransferase   Date Value Ref Range Status   12/04/2017 21 5 - 32 IU/L Final     ALT - Alanine Aminotransferase   Date Value Ref Range Status   12/04/2017 19 5 - 33 IU/L Final     BUN   Date Value Ref Range Status   12/04/2017 10 8 - 20 mg/dL Final     No results found for: HGBA1C, MICROALBUR, MALB24HUR  Glomerular Filtration Rate Estimate   Date Value Ref Range Status   03/06/2016 >60.0 >=60.0 mL/min/1.50m*2 Final     Glomerular Filtration Rate Estimate (Female)   Date Value Ref Range Status   12/04/2017 >60.0 >=60.0 mL/min/1.52m*2 Final

## 2017-12-31 NOTE — Telephone Encounter (Signed)
Patient called in checking on status of this request.

## 2018-01-01 ENCOUNTER — Encounter: Admit: 2018-01-02 | Discharge: 2018-01-02

## 2018-01-01 NOTE — Procedures (Signed)
[facility] PRE-OP INSTRUCTIONS      Date of Surgery: 01/15/18    Your surgeon will contact you with your arrival time the working day prior to your procedure.    Do not eat or drink anything after midnight the night prior to your surgery.  Do not chew gum, tobacco or suck on hard candies after midnight.    *Please follow your surgeon's instructions about fluids if they differ from these.    Do not smoke the day of your surgery. Please limit your smoking in the days leading up to your surgery. [facility] is a tobacco free campus.    If you develop a sore throat, fever or other illness please call your doctor.     Please arrange for an adult to drive you home and be with you for 24 hours after your surgery.    Before leaving for the hospital:    . Take a soapy shower or bath and brush teeth.  . Please do not wear eye make-up.  Marland Kitchen Please wear NO jewelry and remove all piercings.   . Please wear casual, non-restrictive clothing.  . Do not bring credit cards, cash, medication, jewelry, or other valuables.    Do Not Wear Contacts  :    Additional instructions; Dr.  will be your Anesthesiologist and would like you to take the following medications with a small sip of water prior to coming to the hospital: Carvedilol       Pre-op Clinic phone: [phone]  [facility]

## 2018-01-01 NOTE — Telephone Encounter (Signed)
Patient is requesting a refill of carvedilol, last seen by [facility] on 11/16/2016, and no FYI restrictions.  Routed pended order to Acquanetta Chain, DO for review.   No future appointments.     BP Readings from Last 3 Encounters:   12/31/16 150/86   12/20/16 141/84   12/06/16 (!) 142/94     Pulse Readings from Last 3 Encounters:   12/31/16 89   12/20/16 90   12/06/16 93       Last Labs:    LDL Calculated   Date Value Ref Range Status   09/06/2016 69 0 - 129 mg/dL Final     Triglyceride   Date Value Ref Range Status   09/06/2016 118.0 <=150.0 mg/dL Final     Sodium   Date Value Ref Range Status   12/04/2017 140 135 - 145 mmol/L Final     Potassium   Date Value Ref Range Status   12/04/2017 3.57 3.50 - 5.10 mmol/L Final     AST - Aspartate Aminotransferase   Date Value Ref Range Status   12/04/2017 21 5 - 32 IU/L Final     ALT - Alanine Aminotransferase   Date Value Ref Range Status   12/04/2017 19 5 - 33 IU/L Final     BUN   Date Value Ref Range Status   12/04/2017 10 8 - 20 mg/dL Final     No results found for: HGBA1C, MICROALBUR, MALB24HUR  Glomerular Filtration Rate Estimate   Date Value Ref Range Status   03/06/2016 >60.0 >=60.0 mL/min/1.34m*2 Final     Glomerular Filtration Rate Estimate (Female)   Date Value Ref Range Status   12/04/2017 >60.0 >=60.0 mL/min/1.10m*2 Final

## 2018-01-11 ENCOUNTER — Other Ambulatory Visit: Admit: 2018-01-11

## 2018-01-11 DIAGNOSIS — E210 Primary hyperparathyroidism: Secondary | ICD-10-CM

## 2018-01-11 LAB — COMPREHENSIVE METABOLIC PANEL
ALT - Alanine Aminotransferase: 19 IU/L (ref 5–33)
AST - Aspartate Aminotransferase: 23 IU/L (ref 5–32)
Albumin/Globulin Ratio: 1 — ABNORMAL LOW (ref 1.2–2.2)
Albumin: 4 g/dL (ref 3.5–5.0)
Alkaline Phosphatase: 136 IU/L — ABNORMAL HIGH (ref 35–105)
Anion Gap: 14.3 mmol/L (ref 12.0–20.0)
BUN / Creatinine Ratio: 17.1 (ref 12.0–20.0)
BUN: 14 mg/dL (ref 8–20)
Bilirubin Total: 0.6 mg/dL (ref 0.10–1.70)
CO2 - Carbon Dioxide: 24 mmol/L (ref 17–27)
Calcium: 11.3 mg/dL — ABNORMAL HIGH (ref 8.6–10.6)
Chloride: 106 mmol/L (ref 101.0–111.0)
Creatinine: 0.82 mg/dL (ref 0.60–1.30)
Glomerular Filtration Rate Estimate (Female): >60 mL/min/{1.73_m2} (ref >=60.0)
Glucose: 97 mg/dL (ref 74–106)
Osmolality Calculation: 279.8 mOsm/kg (ref 275.0–300.0)
Potassium: 4.26 mmol/L (ref 3.50–5.10)
Protein Total: 8 g/dL (ref 6.4–8.2)
Sodium: 140 mmol/L (ref 135–145)

## 2018-01-11 LAB — 25-HYDROXYVITAMIN D, LC/MS/MS (MAYO)
25-Hydroxy D Total: 24 ng/mL
25-Hydroxy D2: <4 ng/mL
25-Hydroxy D3: 24 ng/mL

## 2018-01-14 NOTE — Telephone Encounter (Signed)
I spoke with patient and confirmed their procedure for 01/15/18 with a check in time of 10:30am. They have already picked up their bowel prep and do not have any questions about how to use it. They are aware of all clear liquids the day before - including NO breakfast, lunch, or dinner and nothing to snack on the day before procedure. They understand that they are able to drink approved clear liquids until 2 hours prior to their check in time. They have arranged a ride home afterwards when leaving the hospital.

## 2018-01-15 MED ORDER — propofol (DIPRIVAN) injection
10 | INTRAVENOUS | Status: DC | PRN
Start: 2018-01-15 — End: 2018-01-15
  Administered 2018-01-15 (×2): 10 mg/mL via INTRAVENOUS

## 2018-01-15 MED ORDER — propofol (DIPRIVAN) injection
10 | INTRAVENOUS | Status: DC | PRN
Start: 2018-01-15 — End: 2018-01-15
  Administered 2018-01-15 (×3): 10 mg/mL via INTRAVENOUS

## 2018-01-15 MED ORDER — propofol (DIPRIVAN) 10 mg/mL injection
10 | INTRAVENOUS | Status: AC
Start: 2018-01-15 — End: ?

## 2018-01-15 MED ORDER — sodium chloride 0.9 % infusion
INTRAVENOUS | Status: DC
Start: 2018-01-15 — End: 2018-01-15
  Administered 2018-01-15: 18:00:00 via INTRAVENOUS

## 2018-01-15 NOTE — Op Note (Signed)
SURGEON: Truitt Merle, MD    ANESTHESIOLOGIST: Maximino Sarin, MD    ANESTHESIA: MAC    PREOPERATIVE DIAGNOSIS: Colon cancer surveillance, history of colon cancer, status post right hemicolectomy    POSTOPERATIVE DIAGNOSIS: Same    OPERATION: Surveillance colonoscopy    FINDINGS: Grossly normal colonoscopy, no issues with ileocolic anastomosis.      DESCRIPTION OF PROCEDURE:       Informed consent was obtained, after which point the patient was taken to the endoscopy suite and transitioned to the left lateral decubitus position.  Appropriate anesthetic monitoring devices were placed and moderate sedation was pursued by the anesthesia team. An unremarkable digital rectal exam was performed. A lubricated colonoscope was then passed to the ileocolic anastomosis which demonstrated no mucosal abnormalities, growths, or stricture.  The anastomosis was widely patent and viable.  The scope was then slowly withdrawn with care to scrutinize the colonic mucosa circumferentially. The transverse, descending, and sigmoid colon were all examined and no polyps, masses, inflammatory changes or other lesions were appreciated.  The patient had an adequate bowel prep. Within the rectum no masses, lesions or polyps were identified. The colonoscope was retroflexed in the rectum noting no other concerning gross pathology. Insufflation was suctioned and colonoscope was then completely removed. Overall the patient tolerated the procedure well.  There were no complications.  The patient was taken to the recovery room in stable condition.       Disposition: Patient is discharged to home after all criteria met in the PACU.     Final Diagnosis: Colon cancer surveillance, history of colon cancer, status post right hemicolectomy    Follow up: Patient is to follow up with me as directed on the AVS. Recommend surveillance colonoscopy in 3-5 years, sooner as indicated.    Condition of patient: Stable

## 2018-01-15 NOTE — OR Nursing (Signed)
PT TOLERATED PROCEDURE WELL BACK TO DSU WITHOUT INCIDENT. ACCOMPANIED BY ANES AND RN.

## 2018-01-15 NOTE — Discharge Instructions (Signed)
Patient Education   Patient Education     General Anesthesia, Adult, Care After  This sheet gives you information about how to care for yourself after your procedure. Your health care provider may also give you more specific instructions. If you have problems or questions, contact your health care provider.  What can I expect after the procedure?  After the procedure, the following side effects are common:  ? Pain or discomfort at the IV site.  ? Nausea.  ? Vomiting.  ? Sore throat.  ? Trouble concentrating.  ? Feeling cold or chills.  ? Weak or tired.  ? Sleepiness and fatigue.  ? Soreness and body aches. These side effects can affect parts of the body that were not involved in surgery.  Follow these instructions at home:    For at least 24 hours after the procedure:  ? Have a responsible adult stay with you. It is important to have someone help care for you until you are awake and alert.  ? Rest as needed.  ? Do not:  ? Participate in activities in which you could fall or become injured.  ? Drive.  ? Use heavy machinery.  ? Drink alcohol.  ? Take sleeping pills or medicines that cause drowsiness.  ? Make important decisions or sign legal documents.  ? Take care of children on your own.  Eating and drinking  ? Follow any instructions from your health care provider about eating or drinking restrictions.  ? When you feel hungry, start by eating small amounts of foods that are soft and easy to digest (bland), such as toast. Gradually return to your regular diet.  ? Drink enough fluid to keep your urine pale yellow.  ? If you vomit, rehydrate by drinking water, juice, or clear broth.  General instructions  ? If you have sleep apnea, surgery and certain medicines can increase your risk for breathing problems. Follow instructions from your health care provider about wearing your sleep device:  ? Anytime you are sleeping, including during daytime naps.  ? While taking prescription pain medicines, sleeping medicines, or  medicines that make you drowsy.  ? Return to your normal activities as told by your health care provider. Ask your health care provider what activities are safe for you.  ? Take over-the-counter and prescription medicines only as told by your health care provider.  ? If you smoke, do not smoke without supervision.  ? Keep all follow-up visits as told by your health care provider. This is important.  Contact a health care provider if:  ? You have nausea or vomiting that does not get better with medicine.  ? You cannot eat or drink without vomiting.  ? You have pain that does not get better with medicine.  ? You are unable to pass urine.  ? You develop a skin rash.  ? You have a fever.  ? You have redness around your IV site that gets worse.  Get help right away if:  ? You have difficulty breathing.  ? You have chest pain.  ? You have blood in your urine or stool, or you vomit blood.  Summary  ? After the procedure, it is common to have a sore throat or nausea. It is also common to feel tired.  ? Have a responsible adult stay with you for the first 24 hours after general anesthesia. It is important to have someone help care for you until you are awake and alert.  ? When you   feel hungry, start by eating small amounts of foods that are soft and easy to digest (bland), such as toast. Gradually return to your regular diet.  ? Drink enough fluid to keep your urine pale yellow.  ? Return to your normal activities as told by your health care provider. Ask your health care provider what activities are safe for you.  This information is not intended to replace advice given to you by your health care provider. Make sure you discuss any questions you have with your health care provider.  Document Released: 09/04/2000 Document Revised: 01/12/2017 Document Reviewed: 01/12/2017  Elsevier Interactive Patient Education ? 2019 Elsevier Inc.       Colonoscopy, Adult, Care After  This sheet gives you information about how to care for  yourself after your procedure. Your health care provider may also give you more specific instructions. If you have problems or questions, contact your health care provider.  What can I expect after the procedure?  After the procedure, it is common to have:  ? A small amount of blood in your stool for 24 hours after the procedure.  ? Some gas.  ? Mild abdominal cramping or bloating.  Follow these instructions at home:  General instructions  ? For the first 24 hours after the procedure:  ? Do not drive or use machinery.  ? Do not sign important documents.  ? Do not drink alcohol.  ? Do your regular daily activities at a slower pace than normal.  ? Eat soft, easy-to-digest foods.  ? Take over-the-counter or prescription medicines only as told by your health care provider.  Relieving cramping and bloating    ? Try walking around when you have cramps or feel bloated.  ? Apply heat to your abdomen as told by your health care provider. Use a heat source that your health care provider recommends, such as a moist heat pack or a heating pad.  ? Place a towel between your skin and the heat source.  ? Leave the heat on for 20-30 minutes.  ? Remove the heat if your skin turns bright red. This is especially important if you are unable to feel pain, heat, or cold. You may have a greater risk of getting burned.  Eating and drinking    ? Drink enough fluid to keep your urine pale yellow.  ? Resume your normal diet as instructed by your health care provider. Avoid heavy or fried foods that are hard to digest.  ? Avoid drinking alcohol for as long as instructed by your health care provider.  Contact a health care provider if:  ? You have blood in your stool 2-3 days after the procedure.  Get help right away if:  ? You have more than a small spotting of blood in your stool.  ? You pass large blood clots in your stool.  ? Your abdomen is swollen.  ? You have nausea or vomiting.  ? You have a fever.  ? You have increasing abdominal pain  that is not relieved with medicine.  Summary  ? After the procedure, it is common to have a small amount of blood in your stool. You may also have mild abdominal cramping and bloating.  ? For the first 24 hours after the procedure, do not drive or use machinery, sign important documents, or drink alcohol.  ? Contact your health care provider if you have a lot of blood in your stool, nausea or vomiting, a fever, or increased abdominal pain.    This information is not intended to replace advice given to you by your health care provider. Make sure you discuss any questions you have with your health care provider.  Document Released: 01/11/2004 Document Revised: 03/21/2017 Document Reviewed: 08/10/2015  Elsevier Interactive Patient Education ? 2019 Elsevier Inc.

## 2018-01-15 NOTE — Anesthesia Pre-Procedure Evaluation (Signed)
Anesthesia Evaluation     Patient summary reviewed and Nursing notes reviewed      Airway   Mallampati: III  TM distance: 6.5 - 8 cm  Neck ROM: fullobesity    Dental - normal exam   (+) poor dentition    Pulmonary - negative ROS and normal exam    breath sounds clear to auscultation  Cardiovascular - normal exam  (+) valvular problems/murmurs ( s/p AVR), CHF,     ECG reviewed  Rhythm: regular  Rate: normal  ROS comment:  ECHO 2017 - Mod LVH o/w unremarkable   CATH 2017 - CLEAN    Neuro/Psych - negative ROS     GI/Hepatic/Renal    (+) obesity ( MORBID)    Endo/Other  (+) , cancer   Musculoskeletal - negative ROS                     Anesthesia Plan    ASA 3     MAC     intravenous induction         Anesthetic plan, risks and benefits discussed with patient.  Consenting person understands and agrees to proceed. anesthesia consent form used. PARQ.  Pre-Anesthesia Evaluation Completed at:  01/15/2018 11:19 AM

## 2018-01-15 NOTE — H&P (Signed)
HISTORY OF PRESENT ILLNESS:    Brandy [name] is a 62 y.o. female referred by Acquanetta Chain, DO for surveillance colonoscopy. Patient is known to me from previous right hemicolectomy for a right-sided colon cancer.  She reports no issues with bowel dysfunction. She has had no visible blood in stool.     MEDICAL / SURGICAL HISTORY:  Past Medical History:   Diagnosis Date   . Arrhythmia    . Cancer [facility])     cancerous polyp from colonoscopy   . CHF (congestive heart failure) (HCC)    . Colon cancer (HCC) 12/28/2016   . Congestive heart failure (CHF) (HCC)    . History of blood transfusion    . 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 AORTIC VALVE REPLACEMENT     . CARDIAC SURGERY  01/23/2016    Valve replacement   . CORONARY ANGIOPLASTY     . PROCEDURE N/A 01/27/2016    Procedure: AORTIC VALVE REPLACEMENT; INTRA AORTIC BALLOON INSERTION;  Surgeon: Otho Bellows, MD;  Location: [facility] OR;  Service: Cardiology;  Laterality: N/A;   . PROCEDURE Right 03/10/2016    Procedure: AMPUTATION TOE;  Surgeon: Chrystie Nose, DPM;  Location: [facility] OR;  Service: Podiatry;  Laterality: Right;   . PROCEDURE N/A 11/28/2016    Procedure: COLONOSCOPY; BIOPSY;  Surgeon: Gailen Shelter, MD;  Location: [facility] OR;  Service: [facility] Procedures;  Laterality: N/A;  ink spot marking apple core mass proximal lesion distal to hepatic flexure   . PROCEDURE Right 12/28/2016    Procedure: LAPAROPSCOPIC CONVERTED TO OPEN HAND ASSISTED RIGHT HEMI COLECTOMY;  Surgeon: Truitt Merle, MD;  Location: [facility] OR;  Service: [facility] Procedures;  Laterality: Right;  Dr. Oran Rein wants 3 hours   . TOE AMPUTATION Right     middle toe amputated d/t heparin reaction   . TONSILLECTOMY     . TUBAL LIGATION  1980        CURRENT MEDS:  No current facility-administered medications for this encounter.         ALLERGIES:  Heparin and Sulfa (sulfonamide antibiotics)      Family History   Problem Relation Age of Onset   .  Cancer Mother         colon cancer   . Heart disease Father    . Heart disease Sister    . Cancer Brother    . Cancer Maternal Aunt    . Heart disease Maternal Aunt    . Cancer Maternal Uncle    . Heart disease Maternal Uncle    . Breast cancer Paternal Aunt         Social History     Socioeconomic History   . Marital status: Married     Spouse name: Not on file   . Number of children: Not on file   . Years of education: Not on file   . Highest education level: Not on file   Tobacco Use   . Smoking status: Passive Smoke Exposure - Never Smoker   . Smokeless tobacco: Never Used   . Tobacco comment: father hx of smoking   Substance and Sexual Activity   . Alcohol use: No   . Drug use: No   . Sexual activity: Not Currently     Partners: Male   Other Topics Concern   . Not on file        ROS  All systems reviewed, pertinent positives and negatives discussed in HPI.        PHYSICAL EXAM  Vitals:    01/01/18 1734   Weight: 267 lb (121.1 kg)   Height: 5' 3 (1.6 m)      Body mass index is 47.3 kg/m?Marland Kitchen    General: no acute distress, alert and oriented x3   HEENT: mucous membranes moist, no scleral icterus   Heme/Lymphatic: No cervical lymphadenopathy.  Abdomen: soft, non tender, non distended  Musculoskeletal/extremities: well perfused bilaterally, no edema  Neurologic: no focal deficits.      ASSESSMENT & PLAN    History of colon cancer status post right hemicolectomy    I reviewed the utility, risks, benefits, and alternatives of colonoscopy and possible biopsy/polypectomy with the patient including bleeding, injury to colon or other internal organs, missed polyp or other lesions, and potential need for additional procedures/operations. The procedure was described in detail and pre/post procedure expectations reviewed. Bowel prep prescription and instructions were provided and all questions answered. A detailed PARQ conference was completed and informed consent was obtained.  We will schedule for the next available. Thank  you for allowing me to participate in the care of this patient.

## 2018-01-15 NOTE — OR Nursing (Signed)
avs verified,  Discharge instructions reviewed with pt and family. Iv dcd.  Vital signs stable.  Denies pain, denies nausea.  Questions answered.  Dr Oran Rein at bedside to talk with familly.  Dc to home

## 2018-01-15 NOTE — OR Nursing (Signed)
PT INTERVIEWED BY S STEWART, RN. ALLERGIES, NPO STATUS AND PROCEDURE REVIEWED WITH PT. PT TO ENDO ROOM. MONITORS AND O2 APPLIED. PT POSITIONED SELF ON LEFT SIDE FOR PROCEDURE.

## 2018-01-15 NOTE — Anesthesia Post-Procedure Evaluation (Signed)
Patient Name: Brandy [name]  Procedures performed: Procedure(s):  COLONOSCOPY; W/ POSSIBLE BIOPSY & POLYPECTOMY    Last Vitals:   Vitals Value Taken Time   BP 91/54 01/15/2018 11:57 AM   Pulse 71 01/15/2018 12:05 PM   Resp 16 01/15/2018 11:57 AM   SpO2 99 % 01/15/2018 12:05 PM   Temp 36.1 ?C 01/15/2018 11:57 AM       Planned Anesthesia Type: MAC  Final Anesthesia Type: MAC  Patients Current Location: Other (comment)  Level of Consciousness: awake  Post Procedure Pain:adequate analgesia  Airway: Patent  Respiratory Status: face mask  Cardio Status: hemodynamically stable  Hydration: adequately hydrated  PONV prophylaxis Ordered  PONV? NO  no anesthesia complication,       planned opioid use           The patient was able to participate in the post op evaluation    Comments:      Barrie Dunker, MD  12:09 PM

## 2018-01-22 NOTE — Telephone Encounter (Signed)
Pt called in to request a refill of: carvedilol (COREG) 6.25 MG tablet   Pharmacy: [facility] [address] [place], [place] - [address] [phone] (Phone)  [phone] (Fax)   Callback number: [phone].

## 2018-01-22 NOTE — Telephone Encounter (Signed)
Spoke to Theadore Nan, at [facility] to inquire if pharmacy received new rx for carvedilol written on 01/01/2018 for a 90 day supply. She states rx was received and they will fill rx for patient. Spoke to patient to inform her [facility] is filling her prescription who verbalize an understanding.

## 2018-02-06 NOTE — Telephone Encounter (Signed)
Per Dr. Valeda Malm instruction, I called patient to check on her and see if her parathyroid surgery has been done yet. She said that the surgery is scheduled for 03/19/18 in [place] with Dr. Jenelle Mages. She said that she feels caught in the middle of all this and just knows that there is a tumor in her throat that needs to be taken care of. I let her know that Dr. Oran Rein just wanted to check in on her and make sure she was taken care of. She said she appreciates this and all her has done for her. She will call us in the future if she needs anything else. RM

## 2018-02-08 ENCOUNTER — Other Ambulatory Visit: Admit: 2018-02-08

## 2018-02-08 DIAGNOSIS — I248 Other forms of acute ischemic heart disease: Secondary | ICD-10-CM

## 2018-02-08 LAB — LIPID PANEL
Chol/HDL Ratio: 4.1 (ref 0.0–5.0)
Cholesterol, HDL: 30 mg/dL — ABNORMAL LOW (ref >=60)
Cholesterol: 123 mg/dL (ref <=200)
LDL Calculated: 55 mg/dL (ref 0–129)
Triglyceride: 192 mg/dL — ABNORMAL HIGH (ref <=150.0)
VLDL Cholesterol Calculation: 38.4 mg/dL — ABNORMAL HIGH (ref 7.0–32.0)

## 2018-02-08 NOTE — Progress Notes (Signed)
Ordered lipid panel as pt is at the lab

## 2018-02-08 NOTE — Telephone Encounter (Signed)
Trey Paula called and stated patient is now at the lab needing a lipid panel.     Looked in patients chart and I do not see a note stating pt needs repeat labs.  HDL from a year ago was not WNL.     Informed Kirckpatrick, PA-C who stated she would put the order in.

## 2018-02-13 ENCOUNTER — Ambulatory Visit: Admit: 2018-02-13 | Discharge: 2018-02-13

## 2018-02-13 DIAGNOSIS — E213 Hyperparathyroidism, unspecified: Secondary | ICD-10-CM

## 2018-02-13 MED ORDER — traMADol (ULTRAM) 50 mg tablet
50 | ORAL_TABLET | Freq: Four times a day (QID) | ORAL | 0 refills | 30.00000 days | Status: DC | PRN
Start: 2018-02-13 — End: 2018-03-20

## 2018-02-13 NOTE — Telephone Encounter (Signed)
Faxed rx for Ultram dated 02/13/18 to [facility]. JG

## 2018-02-13 NOTE — Progress Notes (Signed)
Chief Complaint   Patient presents with   . Annual Exam     no concerns, parathyroid removal in October in [place]       Vitals:    02/13/18 1113   BP: 132/90   BP Location: Left arm   Patient Position: Sitting   Pulse: 82   Resp: 18   Temp: 36.5 ?C (97.7 ?F)   TempSrc: Temporal   SpO2: 92%   Weight: 261 lb 9.6 oz (118.7 kg)       Smoking Counseling given: Yes  Comment: SBIRT 02/13/2018 [facility]      Annual Screening:        results are negative, no review necessary .     No exam data present        Allergies Reviewed  Allergies reviewed with patient or care provider: Yes                Health Maintenance Due   Topic Date Due   . HIV SCREENING  03/31/1973   . INFLUENZA VACCINE  03/12/2018

## 2018-02-13 NOTE — Progress Notes (Signed)
History of Present Illness  Brandy [name] is a 62 y.o. female with history of aortic valve replacement, colon cancer s/p resection with anastomosis, and hyperparathyroidism with nodule who presents today to establish care.    1. Hyperparathyroidism: Nodule on parathyroid gland. Surg set for oct 8 with Dr. Psychologist, educational in [place].     2. Low Back Pain: Previous xray shows scoliosis of thoracic spine. DEXA scan 08/2017 with no signs of osteoporosis/penia. Has had multiple mechanical falls working [place] from fighting off a rooster, which she did successfully, and running after a bull, which got away. She alternates between taking Tylenol and tramadol for pain.  She states she takes these sparingly throughout the week.    UTD on health maintenance     Reviewed Problem List, Past Medical History, Surgical History, Family History, Social history and Allergies.       Review of Systems:  Review of Systems   HENT: Negative for trouble swallowing and voice change.    Respiratory: Negative for shortness of breath.    Cardiovascular: Negative for chest pain and leg swelling.   Gastrointestinal: Negative for abdominal pain.    as documented in HPI.  Outpatient Medications Marked as Taking for the 02/13/18 encounter (Office Visit) with Acquanetta Chain, DO   Medication Sig Dispense Refill   . acetaminophen (TYLENOL) 650 MG CR tablet Take 1 tablet by mouth every 8 hours as needed for Pain. 30 tablet 0   . aspirin 81 MG chewable tablet Chew 81 mg by mouth daily.     Marland Kitchen atorvastatin (LIPITOR) 20 MG tablet Take 1 tablet by mouth every night at bedtime. 90 tablet 0   . calcium carbonate (TUMS) 200 mg calcium (500 mg) chewable tablet Chew 1 tablet by mouth daily.     . carvedilol (COREG) 6.25 MG tablet Take 1 tablet by mouth 2 times daily with meals. 180 tablet 0   . cholecalciferol, vitamin D3, (VITAMIN D3) 1,000 unit tablet Take 4,000 Units by mouth daily.     Marland Kitchen docusate sodium (COLACE) 100 MG capsule Take 1 capsule by mouth 2 times  daily.     . traMADol (ULTRAM) 50 mg tablet Take 1 tablet by mouth every 6 hours as needed for Pain. 30 tablet 0   . [DISCONTINUED] traMADol (ULTRAM) 50 mg tablet Take 50 mg by mouth every 6 hours as needed for Pain.         Physical Exam:  Vitals:    02/13/18 1113   BP: 132/90   BP Location: Left arm   Patient Position: Sitting   Pulse: 82   Resp: 18   Temp: 36.5 ?C (97.7 ?F)   TempSrc: Temporal   SpO2: 92%   Weight: 261 lb 9.6 oz (118.7 kg)   Body mass index is 46.34 kg/m?Marland Kitchen   Physical Exam   Constitutional: She is oriented to person, place, and time. She appears well-developed.   Cardiovascular: Normal rate. Exam reveals gallop and S3.   Midline scar   Pulmonary/Chest: Effort normal and breath sounds normal.   Abdominal:   Midline scar   Musculoskeletal: She exhibits no edema.   Absent distal right 2nd finger  Paraspinal tenderness of thoracic spine from T3-T9 bilaterally   Neurological: She is oriented to person, place, and time.         Assessment and Plan:   1. Hyperparathyroidism [facility])  Has surgery scheduled on October 8 for parathyroid gland removal.    2. Chronic bilateral thoracic back  pain  Chronic back pain in the setting of what appears to be scoliosis based on previous thoracic spine x-ray in March 2019.  She has recent falls but states that pain has not changed after these falls thus I do not feel new x-rays are necessary.  She has been taking Tylenol and tramadol intermittently for pain.  Due to patient's recent health problems including her colon cancer, valve replacements, and hyperparathyroidism nodules she has been unable to pursue treatment for her back pain.  Will have patient follow-up in 6 months after she has successfully recovered from her surgeries and approach treatment options for back pain.    No problem-specific Assessment & Plan notes found for this encounter.      Follow Up:  Return in about 6 months (around 08/14/2018).    THIS PATIENT WAS DISCUSSED WITH:  Dr. Vinson Moselle, who agrees with  the plan and did see the patient.    Acquanetta Chain, DO    Please note: this document was created with the assistance of voice-to-text technology. Effort has been made to minimize transcription errors. Please allow for homonyms and other similar transcription errors, such as she in place of he and vice versa.

## 2018-02-13 NOTE — Progress Notes (Signed)
HQI:ONGE Brandy [name] is a 62 y.o. female with      SNOMED CT(R)   1. Hyperparathyroidism (HCC)  HYPERPARATHYROIDISM       EXAM:  BP 132/90 (BP Location: Left arm, Patient Position: Sitting) Comment: lg cuff  Pulse 82 Comment: regular  Temp 36.5 ?C (97.7 ?F) (Temporal)   Resp 18   Wt 261 lb 9.6 oz (118.7 kg)   LMP  (LMP Unknown)   SpO2 92% Comment: RA  Breastfeeding? No   BMI 46.34 kg/m?     A/P: Brandy [name] is a 62 y.o. female here with      SNOMED CT(R)    1. Hyperparathyroidism (HCC)  HYPERPARATHYROIDISM        I have discussed the case with Dr. Acquanetta Chain, DO    I was physically present during the key portions of the visit/procedure    I have reviewed the resident's exam findings, assessment and agree with the plan of care.

## 2018-02-27 ENCOUNTER — Other Ambulatory Visit: Admit: 2018-02-27

## 2018-02-27 DIAGNOSIS — E559 Vitamin D deficiency, unspecified: Secondary | ICD-10-CM

## 2018-02-27 LAB — 25-HYDROXYVITAMIN D, IMMUNOASSAY (SLM): Vitamin D, 25-OH Total: 21.3 ng/mL — ABNORMAL LOW

## 2018-03-08 ENCOUNTER — Ambulatory Visit: Admit: 2018-03-08 | Discharge: 2018-03-08 | Payer: PRIVATE HEALTH INSURANCE

## 2018-03-08 DIAGNOSIS — Z01818 Encounter for other preprocedural examination: Secondary | ICD-10-CM

## 2018-03-08 LAB — COMPREHENSIVE METABOLIC PANEL
ALT - Alanine Aminotransferase: 25 IU/L (ref 7–52)
AST - Aspartate Aminotransferase: 21 IU/L (ref 8–39)
Albumin/Globulin Ratio: 1.3 (ref >0.9)
Albumin: 4.3 g/dL (ref 3.5–5.0)
Alkaline Phosphatase: 139 IU/L — ABNORMAL HIGH (ref 34–104)
Anion Gap: 10 mmol/L (ref 4–13)
BUN: 18 mg/dL (ref 6–23)
Bilirubin Total: 0.5 mg/dL (ref 0.3–1.2)
CO2 - Carbon Dioxide: 23 mmol/L (ref 21–31)
Calcium: 11.6 mg/dL — ABNORMAL HIGH (ref 8.6–10.3)
Chloride: 107 mmol/L (ref 98–111)
Creatinine: 0.78 mg/dL (ref 0.55–1.10)
GFR Estimated Female: >60 mL/min/{1.73_m2} (ref >=60)
Globulin: 3.3 g/dL (ref 2.2–3.7)
Glucose: 84 mg/dL (ref 80–99)
Potassium: 4.2 mmol/L (ref 3.5–5.1)
Protein Total: 7.6 g/dL (ref 6.0–8.0)
Sodium: 140 mmol/L (ref 135–143)

## 2018-03-08 LAB — CBC WITH AUTO DIFFERENTIAL
Basophils %: 1 % (ref 0–2)
Basophils, Absolute: 0.1 10*3/ÂµL (ref 0.0–0.2)
Eosinophils %: 1 % (ref 0–7)
Eosinophils, Absolute: 0.1 10*3/ÂµL (ref 0.0–0.7)
HCT: 41.4 % (ref 37.0–48.0)
Hemoglobin: 14.2 g/dL (ref 12.0–16.0)
Lymphocytes %: 34 % (ref 25–45)
Lymphocytes, Absolute: 2.2 10*3/ÂµL (ref 1.1–4.3)
MCH: 31.3 pg (ref 27.0–34.0)
MCHC: 34.1 g/dL (ref 32.0–36.0)
MCV: 91.7 fL (ref 81.0–99.0)
MPV: 7.5 fL (ref 7.4–10.4)
Monocytes %: 8 % (ref 0–12)
Monocytes, Absolute: 0.5 10*3/ÂµL (ref 0.0–1.2)
Neutrophils %: 57 % (ref 35–70)
Neutrophils, Absolute: 3.7 10*3/ÂµL (ref 1.6–7.3)
Platelet Count: 239 10*3/ÂµL (ref 150–400)
RBC: 4.52 10*6/ÂµL (ref 4.20–5.40)
RDW: 14.7 % — ABNORMAL HIGH (ref 11.5–14.5)
WBC: 6.5 10*3/ÂµL (ref 4.8–10.8)

## 2018-03-08 NOTE — Consults (Signed)
Preoperative Clinic Consult      Name: Brandy [name]  DOB: [birth year] 62 y.o.  MRN: [id number]  CSN: [id number]    HISTORY:     Chief Complaint:    Case:  [id number] Date/Time:  03/19/18 1350    Procedure:  PARATHYROIDECTOMY; RECURRENT LARYNGEAL NERVE MONITORING, INTRAOPERATIVE PARATHYROID HORMONE (N/A Neck)    Anesthesia type:  General    Diagnosis:       Primary hyperparathyroidism (HCC) [E21.0]      Hypercalcemia [E83.52]    Pre-op diagnosis:       Primary hyperparathyroidism (HCC) [E21.0]      Hypercalcemia [E83.52]    Location:  [facility] OR 05 / [facility] OR    Surgeon:  Edmund Hilda, MD          History of Present Illness:  Brandy [name] is a 62 y.o. morbidly obese female with a history of aortic valve replacement and nonischemic cardiomyopathy, heparin-induced thrombocytopenia, colon cancer status post hemicolectomy who is seen in anticipation of upcoming parathyroidectomy with recurrent laryngeal nerve intraoperative parathyroid hormone monitoring with Dr. Jenelle Mages.    Very pleasant patient who presents to clinic with her husband.  She has had no major anesthetic complications.  She had her aortic valve replaced and has done well from a cardiac perspective.  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    She has had no history of strokes.  No major cardiopulmonary symptoms.  She is obese but denies any obstructive sleep apnea.    Past Medical History:  Past Medical History:   Diagnosis Date   . Arrhythmia    . CHF (congestive heart failure) (HCC)    . Colon cancer (HCC) 12/28/2016   . Colonic mass 11/28/2016    POSTOPERATIVE DIAGNOSIS 11/28/16 colonoscopy:  1) Colonoscopy with 3/4 circumference apple core lesion at full insertion of the colonoscope but likely distal to the Hepatic flexure 2) Hemorrhoids: mild internal 3) Mass: hot forceps biopsied times two without complications and tattooed times two proximal to the mass  Biopsy  suspicious for intramucosal adenocarcinoma. Referral to Dr Oran Rein done and she   . Congestive heart failure (CHF) (HCC)    . Encounter for screening colonoscopy 12/05/2017   . History of blood transfusion    . HIT (heparin-induced thrombocytopenia) (HCC)    . Hyperlipidemia    . Screening for colon cancer 10/04/2016    POSTOPERATIVE DIAGNOSIS 11/28/16 colonoscopy:  1) Colonoscopy with 3/4 circumference apple core lesion at full insertion of the colonoscope but likely distal to the Hepatic flexure 2) Hemorrhoids: mild internal 3) Mass: hot forceps biopsied times two without complications and tattooed times two proximal to the mass  Biopsy suspicious for intramucosal adenocarcinoma. Referral to Dr Oran Rein done and she   . Thyroid disease     Hyperparathyroid     Problem List:  Patient Active Problem List   Diagnosis SNOMED CT(R)   . Demand ischemia (HCC) MYOCARDIAL ISCHEMIA   . Pleural effusion PLEURAL EFFUSION   . Obesity OBESITY   . Iron deficiency anemia IRON DEFICIENCY ANEMIA   . Hilar lymphadenopathy- bilataeral  HILAR LYMPHADENOPATHY   . Liver lesion- small hypodense lesion around falciform lig LESION OF LIVER   . Non-ischemic cardiomyopathy (HCC) CARDIOMYOPATHY   . Heparin induced thrombocytopenia (HCC) HEPARIN-INDUCED THROMBOCYTOPENIA   . Aortic valve disorder AORTIC VALVE DISORDER   . Ischemic ulcer of  toes on both feet (HCC) ISCHEMIC ULCER OF TOE   . Colon cancer (HCC) MALIGNANT TUMOR OF COLON   . Hyperparathyroidism (HCC) HYPERPARATHYROIDISM   . Back pain, thoracic THORACIC BACK PAIN     Past Surgical History:  Past Surgical History:   Procedure Laterality Date   . CARDIAC SURGERY  01/23/2016    AVR   . COLON SURGERY  2018    Hemicolectomy   . CORONARY ANGIOPLASTY     . PROCEDURE N/A 01/27/2016    Procedure: AORTIC VALVE REPLACEMENT; INTRA AORTIC BALLOON INSERTION;  Surgeon: Otho Bellows, MD;  Location: [facility] OR;  Service: Cardiology;  Laterality: N/A;   . PROCEDURE Right 03/10/2016    Procedure: AMPUTATION TOE;   Surgeon: Chrystie Nose, DPM;  Location: [facility] OR;  Service: Podiatry;  Laterality: Right;   . PROCEDURE N/A 11/28/2016    Procedure: COLONOSCOPY; BIOPSY;  Surgeon: Gailen Shelter, MD;  Location: [facility] OR;  Service: [facility] Procedures;  Laterality: N/A;  ink spot marking apple core mass proximal lesion distal to hepatic flexure   . PROCEDURE Right 12/28/2016    Procedure: LAPAROPSCOPIC CONVERTED TO OPEN HAND ASSISTED RIGHT HEMI COLECTOMY;  Surgeon: Truitt Merle, MD;  Location: [facility] OR;  Service: [facility] Procedures;  Laterality: Right;  Dr. Oran Rein wants 3 hours   . PROCEDURE N/A 01/15/2018    Procedure: COLONOSCOPY; W/ POSSIBLE BIOPSY & POLYPECTOMY;  Surgeon: Truitt Merle, MD;  Location: [facility] OR;  Service: [facility] Procedures;  Laterality: N/A;   . TONSILLECTOMY     . TUBAL LIGATION  1980     Current Medications:  Prior to Admission medications    Medication Sig Start Date End Date Taking? Authorizing Provider   acetaminophen (TYLENOL) 650 MG CR tablet Take 1 tablet by mouth every 8 hours as needed for Pain. 03/12/16   Roque Cash, PA   aspirin 81 MG chewable tablet Chew 81 mg by mouth daily.    Historical Provider, MD   atorvastatin (LIPITOR) 20 MG tablet Take 1 tablet by mouth every night at bedtime. 01/01/18   Charlie Price, DO   calcium carbonate (TUMS) 200 mg calcium (500 mg) chewable tablet Chew 1 tablet by mouth daily.    Historical Provider, MD   carvedilol (COREG) 6.25 MG tablet Take 1 tablet by mouth 2 times daily with meals. 01/01/18   Charlie Price, DO   cholecalciferol, vitamin D3, (VITAMIN D3) 1,000 unit tablet Take 4,000 Units by mouth daily.    Historical Provider, MD   cyclobenzaprine (FLEXERIL) 5 MG tablet Take 1 tablet by mouth 3 times daily as needed for Muscle spasms.  Patient taking differently: Take 5 mg by mouth daily as needed for Muscle spasms.  11/16/16   Robina Ade, MD   docusate sodium (COLACE) 100 MG capsule Take 1 capsule by mouth 2 times daily. 12/31/16   Truitt Merle, MD   traMADol (ULTRAM) 50 mg tablet Take 1  tablet by mouth every 6 hours as needed for Pain. 02/13/18   Charlie Price, DO     Allergies:  Heparin and Sulfa (sulfonamide antibiotics)  Family History:  Family History   Problem Relation Age of Onset   . Cancer Mother         colon cancer   . Heart disease Father    . Heart disease Sister    . Cancer Brother    . Cancer Maternal Aunt    . Heart disease Maternal Aunt    . Cancer Maternal Uncle    .  Heart disease Maternal Uncle    . Breast cancer Paternal Aunt      Social History:  Social History     Tobacco Use   . Smoking status: Passive Smoke Exposure - Never Smoker   . Smokeless tobacco: Never Used   . Tobacco comment: SBIRT 02/13/2018 [facility]   Substance Use Topics   . Alcohol use: No   . Drug use: No     Review of Systems:  Review of Systems   Constitutional: Negative.    HENT: Negative.    Eyes: Negative.    Respiratory: Negative.    Cardiovascular: Negative.    Gastrointestinal: Negative.    Genitourinary: Negative.    Musculoskeletal: Positive for back pain.   Skin: Negative.    Neurological: Negative.    Endo/Heme/Allergies: Bruises/bleeds easily.   Psychiatric/Behavioral: Negative.        PHYSICAL EXAM:     Vital Signs:   Vitals:    03/08/18 1324 03/08/18 1334   BP:  132/90   BP Location:  Left arm   Patient Position:  Sitting   Pulse:  86   Resp:  18   Temp:  36.8 ?C   TempSrc:  Oral   SpO2:  96%   Weight: 119.7 kg (264 lb) 119.7 kg (264 lb)   Height: 160 cm (63) 160 cm (63)     Body mass index is 46.77 kg/m?Marland Kitchen    Exam:  Physical Exam   Constitutional: She appears well-developed and well-nourished.   HENT:   Head: Normocephalic and atraumatic.   MP 3  Upper partial.  Poor dentition   Neck: Normal range of motion.   Cardiovascular: Normal rate, regular rhythm, normal heart sounds and intact distal pulses.   Pulmonary/Chest: Effort normal and breath sounds normal.   Musculoskeletal: Normal range of motion. She exhibits no edema.   Skin: Skin is warm and dry.   Psychiatric: She has a normal mood and affect.  Her behavior is normal. Judgment and thought content normal.   Nursing note and vitals reviewed.    FINDINGS:     EKG:   Sinus. RBBB  Labs:  WNL: See epic  Imaging:  ECHO 02/2016  CONCLUSIONS  1.  Sinus rhythm.  2.  Normotension.  3.  Moderate concentric left ventricular hypertrophy (LVH).  Normal left   ventricular (LV) size.  Below normal function.  Ejection fraction (EF) 55-60%.  4.  Normal right ventricular chamber size and function.  No evidence of   pulmonary arterial hypertension but the inferior vena cava (IVC) appears   enlarged, suggesting a central venous pressure (CVP) of 10 mmHg.  5.  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  It was not well enough displaced to   rule out endocarditis.  Consider transesophageal echocardiogram (TEE) if   clinically indicated.  No significant aortic valve regurgitation.  Forward flow   velocity is appropriate for a 23 mm bioprosthetic valve.  6.  No significant pericardial or pleural effusion.  7.  Compared to prior echo from February 02, 2016, left ventricular (LV) function   has improved. Right ventricular (RV) chamber size and function nearly   normalized.     ASSESSMENT:   Brandy [name] is a 62 y.o. morbidly obese female with a history of aortic valve replacement and nonischemic cardiomyopathy, heparin-induced thrombocytopenia, colon cancer status post hemicolectomy who is seen in anticipation of upcoming parathyroidectomy with recurrent laryngeal nerve intraoperative parathyroid hormone monitoring with Dr. Jenelle Mages.    No major  anesthetic concerns.  Body habitus may necessitate a glide scope intubation.  In fact a NIMS tube will need to be used; therefore, a video laryngoscope intubation may verify tube placement for recurrent laryngeal nerve monitoring.    No cardiopulmonary symptoms.  Recent echocardiogram is relatively normal with well-functioning valve.      PLAN:   1. Discussed general anesthesia with standard ASA monitors.  Possible A-line placement for PTH lab  draws  2. Hyperlipidemia: Continue atorvastatin  3. Hypertension: Continue carvedilol  4. Final anesthetic plan per anesthesiologist on day of surgery         Blondell Reveal, MD  03/08/2018  9:07 AM

## 2018-03-08 NOTE — Discharge Instructions (Signed)
Welcome to [facility])      Pre-Surgery Patient Instructions    FOOD:  Absolutely nothing to eat after midnight (night before surgery). This includes chewing gum, mints, candy, and cough drops.    TOBACCO:  No smoking or chewing tobacco on the day of surgery.    DRINK: Clear liquids until 7:30AM.  Clear liquids include:  water, black coffee, plain tea, clear juice, plain jello.    *FAILURE TO FOLLOW THESE INSTRUCTIONS COULD RESULT IN SURGICAL DELAY OR CANCELLATION.                             DAY OF YOUR SURGERY        Check In:  Arrive at Patient Registration waiting area on the main (1st) floor.     ARRIVAL DATE:  03/19/2018      ARRIVAL TIME:  11:45AM      *Please note: we realize you may have received a different time from your surgeon's office; the surgeons' schedules often change, and this is the current arrival time based on our most recent surgery schedule. Thank you for understanding.    Bring:  CPAP machine, crutches, and assistive devices, if applicable. Eye drops and rescue inhalers only; all other medications should remain at home.   What to wear:  Wear loose fitting, comfortable clothing to the hospital. Contact lenses, glasses, and dentures will need to be removed before surgery. Please leave valuables and jewelry at home, including wedding rings.   Transportation:  You may not drive or use public transportation the day of surgery. Please have a responsible adult available to drive you home. We also recommend that a friend or family member be available at home to assist with postoperative home care.    MEDICATIONS:  Take only the following medications indicated below the morning of your surgery with a sip of water. Do not take any herbals, vitamins, or minerals until after your surgery.      Rosalie, [place]   Prior to Surgery Medication Instructions WNU:[id number]    Printed on:03/08/18 1304   Medication Information Take Morning of Surgery Special Instructions           acetaminophen (TYLENOL) 650 MG CR tablet  (acetaminophen)  Take 1 tablet by mouth every 8 hours as needed for Pain.   Yes if needed     ascorbic acid (VITAMIN C ORAL)  (ascorbic acid)  Take by mouth daily.    STOP NOW    aspirin 81 MG chewable tablet  (aspirin)  Chew 81 mg by mouth daily.    Per your surgeon's instructions    atorvastatin (LIPITOR) 20 MG tablet  (atorvastatin calcium)  Take 1 tablet by mouth every night at bedtime.    Ok night before    carvedilol (COREG) 6.25 MG tablet  (carvedilol)  Take 1 tablet by mouth 2 times daily with meals.   Yes     cholecalciferol, vitamin D3, (VITAMIN D3) 1,000 unit tablet  (cholecalciferol (vitamin D3))  Take 4,000 Units by mouth daily.    STOP NOW    docusate sodium (COLACE) 100 MG capsule  (docusate sodium)  Take 1 capsule by mouth 2 times daily.   NO     traMADol (ULTRAM) 50 mg tablet  (tramadol HCl)  Take 1 tablet by mouth every 6 hours as needed for Pain.   Yes if needed  Prior to Surgery    ? Remember to wear loose, comfortable clothes the day of your procedure  ? Leave all jewelry and valuables at home, if possible.  ? Bring cases and any necessary solutions for the following:  -Glasses  -Contact lenses  -Dentures  -Hearing aids  -CPAP machines  ? If you will be going home on the same day as the surgery, you must arrange for a responsible adult to drive you home and care for you for the first 12 to 24 hours.  ? If you will be traveling from an outlying area, plan ahead!  We recommend bringing pillows, blankets, water, food, and medications (including pain medications) for the ride home.  ? If your surgeon gave you a prescription pre-operatively, have it filled and picked up from the pharmacy prior to surgery.  The [facility] pharmacy is unable to fill outpatient prescriptions.    ? Bring a copy of your Durable Power of Attorney, Living Will, Advanced Directive, or POLST with you to the hospital on your day of surgery.    Day of Surgery    Where Do I  Go?  ? On the day of the procedure, use the hospitals [address] Main Entrance (facing [place]).  ? For your convenience, complementary valet parking is available at the [facility], Monday through Friday, 7 a.m.to 5:30 p.m.  ? Please check in at the Patient Registration Desk.  ? A map of the [name] campus can be found using the following link:  [email or web address]    In The Waiting Area    Services for Your Loved Ones  ? Free Wi-Fi access via the [employer].  ? Complimentary coffee and tea.  ? Easy access to bathrooms and the [employer].  ? [facility] volunteers available to answer questions and provide assistance.    Committing To Your Health    Taking a Proactive Approach  ? Read and understand the instructions you have been given so that you know what you need to do before and after the surgery.  ? Ask questions!  Your healthcare team is committed to providing you with excellent care.  ? Understand what your healthcare team is doing to protect you.  Patient safety is our priority.   -You will be asked several times to confirm your name and date of birth.   -You will be asked several times to confirm your surgery and what side is being operated on, if applicable.  ? Schedule and attend all follow-up visits with your doctor.  ? Protect yourself and others:   -Wash your hands regularly and thoroughly.   -If you must cough, cough into your elbow/sleeve; do not cough into your hands.  ? Keep a current list of medications, including over-the-counter and herbal medicines.  ? Know what you are allergic to and be prepared to share this information with your healthcare team.    For Your Recovery    After You Go Home  ? Your surgeon can be reached day and night.  Call if you experience any of the following symptoms:   -Chills or a fever greater than 100 degrees F.   -Redness, red streaks, drainage, swelling, or increased pain at the incision  site.   -Increased bleeding.   -Vomiting.   -Inability to urinate.   -Increased dizziness, sweating, or unusual weakness.  ? Read and understand the more detailed post-op instructions you will receive when you are discharged.    ? For your  convenience, a copy of your detailed post-op instructions will be available to you on your MyChart account:  [email or web address]

## 2018-03-08 NOTE — Procedures (Signed)
Pt instructed to arrive @ 1145 for 1350 surgery on 03/19/18.  Call [phone] for changes.  Per pt per surgeon last dose ASA 02/26/18.

## 2018-03-18 NOTE — H&P (Signed)
This H&P Note was originally documented in a third party system and entered into Epic by Raynelle Dick on behalf of Doristine Mango, MD.      History of Present Illness   Referral source: Larae Grooms, MD  History from: patient  Reason for visit: Parathyroid consult  Chief Complaint: Parathyroid consult  Type of surgery: Hyperparathyroidism  History of Present Illness: Pt here for consult for parathyroid surgery. She has been monitoring her calcium and vitamin D levels with Dr Jackolyn Confer. Her vitamin D level is improved. She c/o occasional swelling or feeling of constriction in her neck and lower throat. Occasional difficulty swallowing food. ........Marland KitchenRaynelle Dick, MA.    62 year old woman, kindly referred by Dr. Jackolyn Confer, reports that her serum calcium level was elevated a year or 2 ago and has continued to increase.      Reports difficulties with fatigue that has not been progressive.  Memory and concentration OK.      She has chronic back pain but denies any pain in the long bones of her extremities.  She has lost 3 inches in height over the years.  No recent fractures.  She reports bone density was OK on testing performed over in [place].    No kidney stones.      Reports occasional hoarseness and voice breaks, as well as throat tightness.  These are not chronic complaints.   Denies a sore throat.    Nonsmoker.    Was operated on for colon cancer last year and reports her most recent folloowup last week demonstrated no evidence of recurrent disease.    Family history negative for parathyroid disease.    Transition of care medication reconciliation was done today.    Current Allergies (reviewed today):  HEPARIN (Critical)  SULFA (Critical)    Active Medications (including medications started today):   VITAMIN D3 2000 UNIT ORAL TABLET (CHOLECALCIFEROL)   DOCUSATE SODIUM CAPSULE   CARVEDILOL TABLET   ATORVASTATIN CALCIUM TABLET   ASPIRIN 81 MG ORAL TABLET DELAYED RELEASE (ASPIRIN)   TYLENOL CAPSULE   CALCIUM 500  MG ORAL TABLET (CALCIUM)   TRAMADOL HCL TABLET       Past Medical History:     Reviewed history from 01/24/2018 and no changes required:        Primary hyperparathyroidism        Cancer:  Colon        Congestive Heart Failure        Valvular heart disease        Broken bones:     Past Surgical History:     Reviewed history from 01/24/2018 and no changes required:        Cardiac surgery        Colon cancer surgery        Tonsillectomy        Notes prior complications from surgery:     Family History  1) Other family member - Arthritis - Entered on 01/28/2018  2) Other family member - Breast Cancer - Entered on 01/28/2018  3) Other family member - Other Cancer - Entered on 01/28/2018  4) Other family member - Diabetes - Entered on 01/28/2018  5) Other family member - Heart Disease - Entered on 01/28/2018  6) Other family member - Thyroid Disorder - Entered on 01/28/2018    Social History    Lives With: spouse  Marital Status: married      Risk Factors  Tobacco Use: never smoker    Alcohol Use: yes  Comments: rarely      Review of Systems   General:  Denies chills, fevers, sweats, fatigue, weight loss  Eyes:  Complains of wears glasses/contacts          Denies loss of vision, double vision, blurred vision, eye pain  Ears/Nose/Throat:  Denies hearing loss right, hearing loss left, hearing loss bilateral, hearing aids right, hearing aids left, hearing aids bilateral, ear pain right, ear pain left, ear pain bilateral, ear drainage right, ear drainage left, ear drainage bilateral, ringing in ear right, ringing in ear left, ringing in ear bilateral, nosebleeds right, nosebleeds left, nosebleed bilateral, snoring, nasal congestion, nasal drainage/discharge, loss of smell, dental pain/problems, mouth sores, sore throat, hoarseness/voice change, difficulty swallowing  Cardiovascular:  Denies chest pain/discomfort, shortness of breath with exertion, palpitations, swelling hands/feet  Respiratory:  Denies cough, shortness of breath,  coughing blood, wheezing  Gastrointestinal:  Denies heartburn/reflux, vomiting blood, nausea/vomiting, abdominal pain  Genitourinary:  Denies trouble starting/stopping urination, painful urination, bloody urine, pregnant  Musculoskeletal:  Denies arthritis, arm pain, back pain, leg pain, joint swelling, joint pain  Skin:  Denies suspicious lesions, poor wound healing,skin cancer, rash  Neurologic:  Denies sensation of room spinning, poor balance, headaches, loss of coordination, numbness, speech difficulty, falling down, seizures, fainting  Psychiatric:  Denies anxiety, depression,mental problems, suicidal thoughts  Endocrine:  Denies cold intolerance,heat intolerance,excessive hunger,excessive thirst  Heme/Lymphatic:  Denies easy bleeding or bruising, blood transfusion, cancer, anemia  Allergic/Immunologic:  Denies seasonal allergies, hives or rash, food allergies, HIV exposure  VITAL SIGNS     Height: 63 in.   Weight: 262 lbs.   BMI: 46.5  Weight Management discussed  Resp rt:  16 / minute  Pulse:  80 / minute  Blood Pressure: 114/73 mm Hg  - taken by: Raynelle Dick        PHYSICAL EXAM   General: No apparent distress. The patient is breathing easily. Voice and speech are normal.   Ears: Both external auditory canals are clear and tympanic membranes are normal.   Nose: External nose is within normal limits. Septum is mildly deviated into each nasal cavity. Mucosa is moist and healthy appearing. There are no polyps and there is no mucopus. There are no lesions or masses.   Mouth and oropharynx: Clear without lesions or masses. Soft palate and uvula are normal.  She has had her tonsils removed.    Neck: Nontender without masses or adenopathy. Thyroid gland is palpably normal.  No palpable masses low anterior neck.  Lymphatics: See neck examination.   Salivary glands: Parotid and submandibular glands are palpably normal.   Skin: Exposed skin of the head and neck is without focal lesions.   Eyes: There are no  periorbital changes. Conjunctivae are healthy-appearing.   Neurologic: Cranial nerves II through XII are grossly intact. Gait is normal. Mood and affect seem appropriate.  No tremor.  Lungs: Clear with good breath sounds in all lung fields. There is no stridor.   Heart: Regular rate and rhythm without murmur.     Fiberoptic Laryngoscopy:  Topical anesthetic/decongestant applied, flexible scope was passed transnasally. Nasopharynx clear without lesions or masses and the eustachian tube orifice was clear on each side. Inspection of the oropharynx demonstrated no abnormalities. Protrusion of the tongue demonstrated a normal tongue base and  vallecula. Hypopharynx opened with Valsalva noted to be normal. Inspection of the larynx demonstrated no lesions or masses. True vocal cord motion was normal bilaterally.    US THYROID performed in office  today:  An 11 x 8 mm hypoechoic ovoid lesion resting just deep to the inferior pole of the right thyroid lobe consistent with Dr. Margo Common findings.  Substance of the parathyroid unremarkable.        STUDIES   Lab results reviewed.    Lab comments:  11/21/2017:  Vitamin D 13.4, PTH 157.6, calcium 11.4, TSH 2.28.  11/22/2017:  24-hour urine calcium 248.3  10/03/2017:  PTH 191.6, calcium 10.9  Referring provider's chart notes were reviewed.  Thyroid ultrasound was reviewed.     Thyroid Ultrasound Comments:  Neck / Thyroid US performed 10/09/2017 by Dr. 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  The thyroid appears normal without nodules.      Impression & Recommendations:    Problem # 1:  Primary hyperparathyroidism (ICD-252.01) (ICD10-E21.0)  We had a long discussion today about treatment options. After doing so, the patient has opted for surgery. The surgical plan is for a minimally invasive parathyroidectomy  with gamma probe. The potential need for a bilateral exploration and possible subtotal parathyroidectomy was discussed.  A full and extended PARQ session was held. Risks discussed included, but were not limited to, bleeding, infection, hoarseness or voice difficulties, diminished vocal range and strength when singing or speaking, respiratory difficulties, swallowing difficulties, neck pain, hypoparathyroidism and persistent hyperparathyroidism potentially requiring additional surgeries.. I did discuss with the patient the very rare risk of bilateral true vocal cord paralysis requiring tracheotomy. The fact that most of these complications are transient was discussed, but the possibility that these complications can be permanent was made clear.  Potential need for laryngoscopy post op.  Routine use of the intraoperative recurrent laryngeal nerve monitor and the intraoperative parathyroid hormone assay were discussed. The expected perioperative course was discussed in detail. All questions were answered and written consent was obtained.    Given her cardiac difficulties and the fact she lives  in [place], I have told her I will admit her overnight for observation.      Problem # 2:  Valve replacement (ICD-V43.3) (ICD10-Z95.2)  She is taking a baby aspirin a day and should be off of this before parathyroid surgery for at least 2 weeks.  She will need clearance from a Cardiologist.  She informs me that heparin or derivatives are not good options due to the fact she developed gangrenous toes and finger the last time she received it.          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  Recheck vitamin D level in a few weeks, prior to her parathyroid surgery.    Thank you for the opportunity to assist you in the care of Mrs. Momon.    This note was generated with the assistance of a medical scribe.  Syntax differences may exist.       Other Orders:  CMS 68  Documentation of Current Medications in Medical Record [phone])            CC:   PCP:   Sabino Dick  Referring:  Larae Grooms, MD              Zacarias Pontes, MD, Cardiologist in [name]        This note was generated with voice recognition software. Some words may be incorrect because they  sound alike. Please notify our office with any possible mistakes you notice.

## 2018-03-18 NOTE — H&P (Deleted)
This H&P Note was originally documented in a third party system and entered into Epic by Raynelle Dick on behalf of Doristine Mango, MD.      History of Present Illness   Referral source: Larae Grooms, MD  History from: patient  Reason for visit: Parathyroid consult  Chief Complaint: Parathyroid consult  Type of surgery: Hyperparathyroidism  History of Present Illness: Pt here for consult for parathyroid surgery. She has been monitoring her calcium and vitamin D levels with Dr Jackolyn Confer. Her vitamin D level is improved. She c/o occasional swelling or feeling of constriction in her neck and lower throat. Occasional difficulty swallowing food. ........Marland KitchenRaynelle Dick, MA     62 year old woman, kindly referred by Dr. Jackolyn Confer, reports that her serum calcium level was elevated a year or 2 ago and has continued to increase.      Reports difficulties with fatigue that has not been progressive.  Memory and concentration OK.      She has chronic back pain but denies any pain in the long bones of her extremities.  She has lost 3 inches in height over the years.  No recent fractures.  She reports bone density was OK on testing performed over in [place].    No kidney stones.      Reports occasional hoarseness and voice breaks, as well as throat tightness.  These are not chronic complaints.   Denies a sore throat.    Nonsmoker.    Was operated on for colon cancer last year and reports her most recent folloowup last week demonstrated no evidence of recurrent disease.    Family history negative for parathyroid disease.    Transition of care medication reconciliation was done today.    Current Allergies (reviewed today):  HEPARIN (Critical)  SULFA (Critical)    Active Medications (including medications started today):   VITAMIN D3 2000 UNIT ORAL TABLET (CHOLECALCIFEROL)   DOCUSATE SODIUM CAPSULE   CARVEDILOL TABLET   ATORVASTATIN CALCIUM TABLET   ASPIRIN 81 MG ORAL TABLET DELAYED RELEASE (ASPIRIN)   TYLENOL CAPSULE   CALCIUM 500  MG ORAL TABLET (CALCIUM)   TRAMADOL HCL TABLET       Past Medical History:     Reviewed history from 01/24/2018 and no changes required:        Primary hyperparathyroidism        Cancer:  Colon        Congestive Heart Failure        Valvular heart disease        Broken bones:     Past Surgical History:     Reviewed history from 01/24/2018 and no changes required:        Cardiac surgery        Colon cancer surgery        Tonsillectomy        Notes prior complications from surgery:     Family History  1) Other family member - Arthritis - Entered on 01/28/2018  2) Other family member - Breast Cancer - Entered on 01/28/2018  3) Other family member - Other Cancer - Entered on 01/28/2018  4) Other family member - Diabetes - Entered on 01/28/2018  5) Other family member - Heart Disease - Entered on 01/28/2018  6) Other family member - Thyroid Disorder - Entered on 01/28/2018    Social History    Lives With: spouse  Marital Status: married      Risk Factors  Tobacco Use: never smoker    Alcohol Use:  yes  Comments: rarely      Review of Systems   General:  Denies chills, fevers, sweats, fatigue, weight loss  Eyes:  Complains of wears glasses/contacts          Denies loss of vision, double vision, blurred vision, eye pain  Ears/Nose/Throat:  Denies hearing loss right, hearing loss left, hearing loss bilateral, hearing aids right, hearing aids left, hearing aids bilateral, ear pain right, ear pain left, ear pain bilateral, ear drainage right, ear drainage left, ear drainage bilateral, ringing in ear right, ringing in ear left, ringing in ear bilateral, nosebleeds right, nosebleeds left, nosebleed bilateral, snoring, nasal congestion, nasal drainage/discharge, loss of smell, dental pain/problems, mouth sores, sore throat, hoarseness/voice change, difficulty swallowing  Cardiovascular:  Denies chest pain/discomfort, shortness of breath with exertion, palpitations, swelling hands/feet  Respiratory:  Denies cough, shortness of breath,  coughing blood, wheezing  Gastrointestinal:  Denies heartburn/reflux, vomiting blood, nausea/vomiting, abdominal pain  Genitourinary:  Denies trouble starting/stopping urination, painful urination, bloody urine, pregnant  Musculoskeletal:  Denies arthritis, arm pain, back pain, leg pain, joint swelling, joint pain  Skin:  Denies suspicious lesions, poor wound healing,skin cancer, rash  Neurologic:  Denies sensation of room spinning, poor balance, headaches, loss of coordination, numbness, speech difficulty, falling down, seizures, fainting  Psychiatric:  Denies anxiety, depression,mental problems, suicidal thoughts  Endocrine:  Denies cold intolerance,heat intolerance,excessive hunger,excessive thirst  Heme/Lymphatic:  Denies easy bleeding or bruising, blood transfusion, cancer, anemia  Allergic/Immunologic:  Denies seasonal allergies, hives or rash, food allergies, HIV exposure  VITAL SIGNS     Height: 63 in.   Weight: 262 lbs.   BMI: 46.5  Weight Management discussed  Resp rt:  16 / minute  Pulse:  80 / minute  Blood Pressure: 114/73 mm Hg  - taken by: Raynelle Dick        PHYSICAL EXAM   General: No apparent distress. The patient is breathing easily. Voice and speech are normal.   Ears: Both external auditory canals are clear and tympanic membranes are normal.   Nose: External nose is within normal limits. Septum is mildly deviated into each nasal cavity. Mucosa is moist and healthy appearing. There are no polyps and there is no mucopus. There are no lesions or masses.   Mouth and oropharynx: Clear without lesions or masses. Soft palate and uvula are normal.  She has had her tonsils removed.    Neck: Nontender without masses or adenopathy. Thyroid gland is palpably normal.  No palpable masses low anterior neck.  Lymphatics: See neck examination.   Salivary glands: Parotid and submandibular glands are palpably normal.   Skin: Exposed skin of the head and neck is without focal lesions.   Eyes: There are no  periorbital changes. Conjunctivae are healthy-appearing.   Neurologic: Cranial nerves II through XII are grossly intact. Gait is normal. Mood and affect seem appropriate.  No tremor.  Lungs: Clear with good breath sounds in all lung fields. There is no stridor.   Heart: Regular rate and rhythm without murmur.     Fiberoptic Laryngoscopy:  Topical anesthetic/decongestant applied, flexible scope was passed transnasally. Nasopharynx clear without lesions or masses and the eustachian tube orifice was clear on each side. Inspection of the oropharynx demonstrated no abnormalities. Protrusion of the tongue demonstrated a normal tongue base and  vallecula. Hypopharynx opened with Valsalva noted to be normal. Inspection of the larynx demonstrated no lesions or masses. True vocal cord motion was normal bilaterally.    US THYROID performed  in office today:  An 11 x 8 mm hypoechoic ovoid lesion resting just deep to the inferior pole of the right thyroid lobe consistent with Dr. Margo Common findings.  Substance of the parathyroid unremarkable.        STUDIES   Lab results reviewed.    Lab comments:  11/21/2017:  Vitamin D 13.4, PTH 157.6, calcium 11.4, TSH 2.28.  11/22/2017:  24-hour urine calcium 248.3  10/03/2017:  PTH 191.6, calcium 10.9  Referring provider's chart notes were reviewed.  Thyroid ultrasound was reviewed.     Thyroid Ultrasound Comments:  Neck / Thyroid US performed 10/09/2017 by Dr. 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  The thyroid appears normal without nodules.      Impression & Recommendations:    Problem # 1:  Primary hyperparathyroidism (ICD-252.01) (ICD10-E21.0)  We had a long discussion today about treatment options. After doing so, the patient has opted for surgery. The surgical plan is for a minimally invasive parathyroidectomy  with gamma probe. The potential need for a bilateral exploration and possible subtotal parathyroidectomy was discussed.  A full and extended PARQ session was held. Risks discussed included, but were not limited to, bleeding, infection, hoarseness or voice difficulties, diminished vocal range and strength when singing or speaking, respiratory difficulties, swallowing difficulties, neck pain, hypoparathyroidism and persistent hyperparathyroidism potentially requiring additional surgeries.. I did discuss with the patient the very rare risk of bilateral true vocal cord paralysis requiring tracheotomy. The fact that most of these complications are transient was discussed, but the possibility that these complications can be permanent was made clear.  Potential need for laryngoscopy post op.  Routine use of the intraoperative recurrent laryngeal nerve monitor and the intraoperative parathyroid hormone assay were discussed. The expected perioperative course was discussed in detail. All questions were answered and written consent was obtained.    Given her cardiac difficulties and the fact she lives  in [place], I have told her I will admit her overnight for observation.      Problem # 2:  Valve replacement (ICD-V43.3) (ICD10-Z95.2)  She is taking a baby aspirin a day and should be off of this before parathyroid surgery for at least 2 weeks.  She will need clearance from a Cardiologist.  She informs me that heparin or derivatives are not good options due to the fact she developed gangrenous toes and finger the last time she received it.          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  Recheck vitamin D level in a few weeks, prior to her parathyroid surgery.    Thank you for the opportunity to assist you in the care of Mrs. Whitehorn.    This note was generated with the assistance of a medical scribe.  Syntax differences may exist.       Other Orders:  CMS 68  Documentation of Current Medications in Medical Record [id number])            CC:   PCP:   Sabino Dick  Referring:  Larae Grooms, MD              Zacarias Pontes, MD, Cardiologist in [facility]        This note was generated with voice recognition software. Some words may be incorrect  because they sound alike. Please notify our office with any possible mistakes you notice.       Electronically signed by Edmund Hilda, MD, FACS     ________________________________________________________________________

## 2018-03-19 ENCOUNTER — Inpatient Hospital Stay: Admit: 2018-03-19 | Payer: PRIVATE HEALTH INSURANCE | Attending: MD

## 2018-03-19 DIAGNOSIS — E210 Primary hyperparathyroidism: Secondary | ICD-10-CM

## 2018-03-19 LAB — POST-EXCISION RAPID PTH
PTH % Reduction: 65 % (ref >=50)
PTH % Reduction: 81 % (ref >=50)
PTH % Reduction: 84 % (ref >=50)
PTH Pre-op: 249 pg/mL — ABNORMAL HIGH (ref 12–88)
PTH Pre-op: 249 pg/mL — ABNORMAL HIGH (ref 12–88)
PTH Pre-op: 249 pg/mL — ABNORMAL HIGH (ref 12–88)
Post-Excision Rapid PTH Minutes: 10 Minutes
Post-Excision Rapid PTH Minutes: 15 Minutes
Post-Excision Rapid PTH Minutes: 5 Minutes
Post-Excision Rapid PTH: 39 pg/mL (ref 12–88)
Post-Excision Rapid PTH: 48 pg/mL (ref 12–88)
Post-Excision Rapid PTH: 87 pg/mL (ref 12–88)

## 2018-03-19 LAB — TISSUE EXAM

## 2018-03-19 LAB — PTH, INTACT: PTH: 176.8 pg/mL — ABNORMAL HIGH (ref 12.0–88.0)

## 2018-03-19 LAB — PRE-OP BASELINE RAPID PTH: PTH Pre-op: 249 pg/mL — ABNORMAL HIGH (ref 12–88)

## 2018-03-19 MED ORDER — acetaminophen (OFIRMEV) 1,000 mg/100 mL (10 mg/mL) IV
1000 | INTRAVENOUS | Status: AC
Start: 2018-03-19 — End: ?

## 2018-03-19 MED ORDER — ondansetron (ZOFRAN) injection 4 mg
4 | Freq: Four times a day (QID) | INTRAMUSCULAR | Status: DC | PRN
Start: 2018-03-19 — End: 2018-03-19

## 2018-03-19 MED ORDER — HYDROmorphone (PF) (DILAUDID) injection 0.2-0.4 mg
0.5 | INTRAMUSCULAR | Status: DC | PRN
Start: 2018-03-19 — End: 2018-03-19

## 2018-03-19 MED ORDER — HYDROmorphone (PF) (DILAUDID) 0.5 mg/0.5 mL injection
0.5 | INTRAMUSCULAR | Status: AC
Start: 2018-03-19 — End: ?

## 2018-03-19 MED ORDER — dexAMETHasone sodium phos (PF) (DECADRON) injection
10 | INTRAMUSCULAR | Status: DC | PRN
Start: 2018-03-19 — End: 2018-03-19
  Administered 2018-03-19: 22:00:00 10 mg/mL via INTRAVENOUS

## 2018-03-19 MED ORDER — technetium TC 99M sestamibi (CARDIOLITE) injection 25.1 millicurie
Freq: Once | Status: AC
Start: 2018-03-19 — End: 2018-03-19
  Administered 2018-03-19: 22:00:00 via INTRAVENOUS

## 2018-03-19 MED ORDER — succinylcholine (ANECTINE) injection
20 | INTRAMUSCULAR | Status: DC | PRN
Start: 2018-03-19 — End: 2018-03-19
  Administered 2018-03-19: 22:00:00 20 mg/mL via INTRAVENOUS

## 2018-03-19 MED ORDER — HYDROmorphone (PF) (DILAUDID) injection 0.5-0.8 mg
0.5 | INTRAMUSCULAR | Status: DC | PRN
Start: 2018-03-19 — End: 2018-03-19

## 2018-03-19 MED ORDER — fentaNYL (PF) (SUBLIMAZE) injection 25-50 mcg
50 | INTRAMUSCULAR | Status: DC | PRN
Start: 2018-03-19 — End: 2018-03-19

## 2018-03-19 MED ORDER — HYDROmorphone (DILAUDID) syringe
1 | INTRAMUSCULAR | Status: DC | PRN
Start: 2018-03-19 — End: 2018-03-19
  Administered 2018-03-19: 23:00:00 1 mg/mL via INTRAVENOUS

## 2018-03-19 MED ORDER — albuterol (PROVENTIL) nebulizer solution 2.5 mg
2.5 | Freq: Once | RESPIRATORY_TRACT | Status: DC | PRN
Start: 2018-03-19 — End: 2018-03-19

## 2018-03-19 MED ORDER — fentaNYL (PF) (SUBLIMAZE) injection
50 | INTRAMUSCULAR | Status: DC | PRN
Start: 2018-03-19 — End: 2018-03-19
  Administered 2018-03-19: 22:00:00 50 mcg/mL via INTRAVENOUS

## 2018-03-19 MED ORDER — lidocaine-EPINEPHrine 1 %-1:200,000 injection
1 | INTRAMUSCULAR | Status: DC | PRN
Start: 2018-03-19 — End: 2018-03-19
  Administered 2018-03-19: 23:00:00 1 %-:200,000 via SUBCUTANEOUS

## 2018-03-19 MED ORDER — ipratropium-albuterol (DUO-NEB) 0.5 mg-3 mg(2.5 mg base)/3 mL nebulizer solution 3 mL
0.5 | RESPIRATORY_TRACT | Status: DC | PRN
Start: 2018-03-19 — End: 2018-03-19

## 2018-03-19 MED ORDER — propofol (DIPRIVAN) injection
10 | INTRAVENOUS | Status: DC | PRN
Start: 2018-03-19 — End: 2018-03-19
  Administered 2018-03-19: 22:00:00 10 mg/mL via INTRAVENOUS

## 2018-03-19 MED ORDER — ceFAZolin (ANCEF) injection
1 | INTRAMUSCULAR | Status: DC | PRN
Start: 2018-03-19 — End: 2018-03-19
  Administered 2018-03-19: 23:00:00 1 gram via INTRAVENOUS

## 2018-03-19 MED ORDER — diphenhydrAMINE (BENADRYL) injection 25 mg
50 | INTRAMUSCULAR | Status: DC | PRN
Start: 2018-03-19 — End: 2018-03-19

## 2018-03-19 MED ORDER — fentaNYL (PF) (SUBLIMAZE) injection 50-100 mcg
50 | INTRAMUSCULAR | Status: DC | PRN
Start: 2018-03-19 — End: 2018-03-19

## 2018-03-19 MED ORDER — lactated Ringers infusion
INTRAVENOUS | Status: DC
Start: 2018-03-19 — End: 2018-03-19
  Administered 2018-03-19 – 2018-03-20 (×3): via INTRAVENOUS

## 2018-03-19 MED ORDER — fentaNYL (PF) (SUBLIMAZE) 50 mcg/mL injection
50 | INTRAMUSCULAR | Status: AC
Start: 2018-03-19 — End: ?

## 2018-03-19 MED ORDER — meperidine (PF) (DEMEROL) injection 12.5 mg
50 | INTRAMUSCULAR | Status: DC | PRN
Start: 2018-03-19 — End: 2018-03-19

## 2018-03-19 MED ORDER — ondansetron (ZOFRAN) injection
4 | INTRAMUSCULAR | Status: DC | PRN
Start: 2018-03-19 — End: 2018-03-19
  Administered 2018-03-19: 22:00:00 4 mg/2 mL via INTRAVENOUS

## 2018-03-19 MED ORDER — rocuronium (ZEMURON) injection
10 | INTRAVENOUS | Status: DC | PRN
Start: 2018-03-19 — End: 2018-03-19
  Administered 2018-03-19: 22:00:00 10 mg/mL via INTRAVENOUS

## 2018-03-19 MED FILL — OFIRMEV 1,000 MG/100 ML (10 MG/ML) INTRAVENOUS SOLUTION: 1000 mg/100 mL (10 mg/mL) | INTRAVENOUS | Qty: 100

## 2018-03-19 MED FILL — FENTANYL (PF) 50 MCG/ML INJECTION SOLUTION: 50 ug/mL | INTRAMUSCULAR | Qty: 5

## 2018-03-19 MED FILL — DILAUDID (PF) 0.5 MG/0.5 ML INJECTION SYRINGE: 0.5 | INTRAMUSCULAR | Qty: 1

## 2018-03-19 NOTE — Op Note (Signed)
DATE OF PROCEDURE:  03/19/2018    PREOPERATIVE DIAGNOSIS:  Hyperparathyroidism.    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    PROCEDURES:  1.  Minimally invasive right inferior parathyroidectomy.  2.  Intraoperative recurrent laryngeal nerve monitoring.    SURGEON:  Edmund Hilda, MD.    ASSISTANT:  Judithann Sauger, MD.    ANESTHESIA:  Endotracheal tube, general anesthetic.    ANESTHESIOLOGIST:  Ophelia Shoulder, MD.    INDICATIONS:  The patient is a 62 year old woman with hyperparathyroidism from   which she is growing increasingly symptomatic.  Dr. Jackolyn Confer, her   endocrinologist, performed an ultrasound demonstrating a lesion in the region of  the inferior pole of the right thyroid lobe consistent with parathyroid adenoma.   After reviewing treatment options, the patient has opted for surgery.    FINDINGS:  Preoperative ultrasound performed in the operating room demonstrated   a 1.5 cm hypoechoic lesion abutting the inferior pole of the right thyroid lobe.   No other findings suggestive of a parathyroid abnormality evident.    Intraoperative findings demonstrated a 1.5 cm well-circumscribed and ovoid   lesion resting adjacent to the inferior pole of the right thyroid lobe.  This   was removed using capsular dissection.  Frozen section confirmed hypercellular   parathyroid tissue.  The right recurrent laryngeal nerve was identified early on  during the course of the dissection and preserved.  I did identify a   normal-appearing right superior parathyroid gland just lateral to the right   recurrent laryngeal nerve as the nerve entered the larynx.  This normal right   superior parathyroid gland was marked using a double Hemoclip along its most   distal aspect to preserve its blood supply.    Pre-incision parathyroid hormone level was 249.  At 5 minutes post-excision, the  parathyroid hormone level dropped to 87 for a reduction of 65%.  At 10 minutes      post-excision, the parathyroid hormone level dropped further to 48 for a   reduction of 81% and, at 15 minutes post-excision, the parathyroid hormone level  dropped to 39 for a total reduction of 84% from her pre-incision parathyroid   hormone level.  Given the significant drop and the identification of a normal   right superior parathyroid gland, left parathyroid exploration was not   undertaken.    The right recurrent laryngeal nerve was noted to stimulate well at 0.5 milliamps  at its most inferior aspect at the completion of the dissection.    DESCRIPTION OF PROCEDURE:  The patient was taken to the operating room and   placed in a supine position.  General anesthesia was induced without incident.    Proper positioning of monitoring endotracheal tube was confirmed by direct   visualization and the recurrent laryngeal nerve monitor was set up and confirmed  to be functioning well.  She was carefully positioned and padded.  Pre-incision   ultrasound was performed with findings as outlined above.    Her neck was then prepped and draped in a sterile manner and pre-incision   timeout was held.    A horizontal incision was then sharply made at the low anterior neck within a   natural skin crease that had been marked preoperatively using a surgical marker   while the patient was in the holding area in a standing position.  Sharp   dissection continued down onto the strap muscles, which were separated at the  midline.  The right-sided strap muscles were reflected off the underlying right   thyroid lobe.  Inspection and palpation of the base of the wound demonstrated   the lesion just below the inferior pole of the right thyroid lobe.  The right   middle thyroid vein was divided and ligated and the right thyroid lobe was   rolled medially.  The right recurrent laryngeal nerve was identified at its   expected anatomic position and preserved.  The abnormal right inferior   parathyroid gland was removed using capsular  dissection, and this lesion was   submitted to the pathologist with findings as outlined above.  As noted, her   post-excision parathyroid hormone level dropped 84% at 15 minutes after the   adenoma had been removed.    The normal-appearing right superior parathyroid gland was identified and marked   as outlined above.    The wound was then irrigated with copious amounts of sterile saline solution and  hemostasis was obtained using judicious bipolar electrocautery at all times well  away from the right recurrent laryngeal nerve.  The right recurrent laryngeal   nerve was noted to stimulate well at 0.5 milliamps at its most inferior aspect   in the neck.  The patient did receive sestamibi preoperatively.  Gamma probe   readings directed at the right inferior parathyroid, both in vivo and ex vivo,   were in the 1100-1200 range.  This is in contrast to a field reading of 300-400.   I did pass the gamma probe on both sides of the neck and there was no area of   increased activity after the right inferior parathyroid gland adenoma had been   removed.    The right thyroid lobe was returned to its anatomic position and the strap   muscles were reapproximated using a simple, muscular stitch with absorbable   suture material.  The platysma was reapproximated in the same manner and final   skin closure consisted of a running, subcuticular stitch with absorbable suture   material.  A sterile Dermabond dressing was applied and the procedure was ended.   The patient was returned to the recovery room in good condition.  Final counts   were correct.  There were no complications.  Estimated blood loss was minimal.    Edmund Hilda, MD    SJT/nlrowemt  D:  03/19/2018  7:28 P   T:  03/19/2018  7:49 P   TID:  [id number]  RECEIPT:    [id number]  cc:  Crawford Givens, MD

## 2018-03-19 NOTE — Anesthesia Post-Procedure Evaluation (Signed)
Patient Name: Brandy [name]  Procedures performed: Procedure(s):  PARATHYROIDECTOMY; RECURRENT LARYNGEAL NERVE MONITORING, INTRAOPERATIVE PARATHYROID HORMONE    Last Vitals:   Vitals Value Taken Time   BP     Pulse 78 03/19/2018  5:24 PM   Resp     SpO2 99 % 03/19/2018  5:00 PM   Temp     Vitals shown include unvalidated device data.    Planned Anesthesia Type: general  Final Anesthesia Type: general  Patients Current Location: PACU  Level of Consciousness: awake and responds appropriately  Post Procedure Pain:adequate analgesia  Airway: Patent  Respiratory Status: nasal cannula  Cardio Status: hemodynamically stable  Hydration: addequetly hydrated  PONV prophylaxis Ordered  PONV? NO  no anesthesia complication,       planned opioid use           The patient was able to participate in the post op evaluation    Comments:      Laurin Coder, MD  5:24 PM

## 2018-03-19 NOTE — Anesthesia Procedure Notes (Signed)
Arterial Line:    An arterial line was placed using ultrasound guidance, in the OR for the following indication(s): continuous blood pressure monitoring.    A 20 gauge (size), 1 and 3/4 inch (length), Arrow (type) catheter was placed, Seldinger technique used, into the left radial artery, secured by tape.    Events:  patient tolerated procedure well with no complications.  General Information and Staff    Anesthesiologist: Cresta Riden Shepard Sequan Auxier, MD  Performed: anesthesiologist

## 2018-03-19 NOTE — Anesthesia Pre-Procedure Evaluation (Signed)
Anesthesia Evaluation     Patient summary reviewed    No history of anesthetic complications     Airway   Dental      Pulmonary    Cardiovascular   (+) valvular problems/murmurs (S/P AVR), CHF,     ECG reviewed  ROS comment: Echo:    CONCLUSIONS  1.  Sinus rhythm.  2.  Normotension.  3.  Moderate concentric left ventricular hypertrophy (LVH).  Normal left   ventricular (LV) size.  Below normal function.  Ejection fraction (EF) 55-60%.  4.  Normal right ventricular chamber size and function.  No evidence of   pulmonary arterial hypertension but the inferior vena cava (IVC) appears   enlarged, suggesting a central venous pressure (CVP) of 10 mmHg.  5.  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  It was not well enough displaced to   rule out endocarditis.  Consider transesophageal echocardiogram (TEE) if   clinically indicated.  No significant aortic valve regurgitation.  Forward flow   velocity is appropriate for a 23 mm bioprosthetic valve.  6.  No significant pericardial or pleural effusion.  7.  Compared to prior echo from February 02, 2016, left ventricular (LV) function   has improved. Right ventricular (RV) chamber size and function nearly   normalized.    Neuro/Psych      GI/Hepatic/Renal      Endo/Other  (+) , cancer    Comments: H/O heparin induced thrombocytopenia (AVR)    Hyperparathyroidism   Musculoskeletal                    Anesthesia Plan    ASA 3     general     intravenous induction     In addition to Standard ASA monitors the following may be placed arterial line

## 2018-03-20 LAB — CALCIUM
Calcium: 9.8 mg/dL (ref 8.6–10.3)
Calcium: 9.7 mg/dL (ref 8.6–10.3)
Calcium: 9.7 mg/dL (ref 8.6–10.3)

## 2018-03-20 MED ORDER — HYDROcodone-acetaminophen (NORCO) 5-325 mg per tablet
5-325 | ORAL_TABLET | ORAL | 0 refills | 30.00000 days | Status: AC | PRN
Start: 2018-03-20 — End: 2018-03-27

## 2018-03-20 MED ORDER — calcium-vitamin D 500 mg-200 unit per tablet
500 | ORAL_TABLET | Freq: Three times a day (TID) | ORAL | 1 refills | Status: DC
Start: 2018-03-20 — End: 2021-03-18

## 2018-03-20 MED ORDER — acetaminophen (TYLENOL) CR tablet 650 mg
650 | Freq: Three times a day (TID) | ORAL | Status: DC | PRN
Start: 2018-03-20 — End: 2018-03-19

## 2018-03-20 MED ORDER — acetaminophen (TYLENOL) tablet 650 mg
325 | Freq: Three times a day (TID) | ORAL | Status: DC | PRN
Start: 2018-03-20 — End: 2018-03-20

## 2018-03-20 MED ORDER — cholecalciferol (VITAMIN D3) tablet 4,000 Units
25 | Freq: Every day | ORAL | Status: DC
Start: 2018-03-20 — End: 2018-03-20
  Administered 2018-03-20: 15:00:00 25 [IU] via ORAL

## 2018-03-20 MED ORDER — atorvastatin (LIPITOR) tablet 20 mg
20 | Freq: Every evening | ORAL | Status: DC
Start: 2018-03-20 — End: 2018-03-20
  Administered 2018-03-20: 03:00:00 20 mg via ORAL

## 2018-03-20 MED ORDER — cephalexin (KEFLEX) 500 MG capsule
500 | ORAL_CAPSULE | Freq: Four times a day (QID) | ORAL | 0 refills | 7.00000 days | Status: AC
Start: 2018-03-20 — End: 2018-03-23

## 2018-03-20 MED ORDER — acetaminophen (OFIRMEV) IV 1,000 mg
1000 | Freq: Once | INTRAVENOUS | Status: AC
Start: 2018-03-20 — End: 2018-03-19
  Administered 2018-03-20: 02:00:00 1000 mg via INTRAVENOUS
  Administered 2018-03-20: via INTRAVENOUS

## 2018-03-20 MED ORDER — sodium chloride 0.9 % (NS) syringe 10 mL
INTRAMUSCULAR | Status: DC | PRN
Start: 2018-03-20 — End: 2018-03-20

## 2018-03-20 MED ORDER — sodium chloride 0.9 % (NS) syringe 10 mL
INTRAMUSCULAR | Status: DC
Start: 2018-03-20 — End: 2018-03-20

## 2018-03-20 MED ORDER — calcium gluconate 3 g in sodium chloride 0.9 % (NS) 50 mL IVPB
100 | INTRAVENOUS | Status: DC | PRN
Start: 2018-03-20 — End: 2018-03-20

## 2018-03-20 MED ORDER — docusate sodium (COLACE) capsule 250 mg
250 | Freq: Every day | ORAL | Status: DC
Start: 2018-03-20 — End: 2018-03-20
  Administered 2018-03-20: 15:00:00 250 mg via ORAL

## 2018-03-20 MED ORDER — ceFAZolin (ANCEF) 2 g in sodium chloride 0.9 % (NS) 70 mL IVPB
10 | Freq: Three times a day (TID) | INTRAMUSCULAR | Status: AC
Start: 2018-03-20 — End: 2018-03-20
  Administered 2018-03-20 (×4): via INTRAVENOUS

## 2018-03-20 MED ORDER — flu vaccine QS (6 mos+)(PF) 2019-20 (FLULAVAL QUAD/FLUARIX QUAD) syringe 0.5 mL
60 | INTRAMUSCULAR | Status: AC
Start: 2018-03-20 — End: 2018-03-20
  Administered 2018-03-20: 18:00:00 60 via INTRAMUSCULAR

## 2018-03-20 MED ORDER — lactated Ringers infusion
INTRAVENOUS | Status: DC
Start: 2018-03-20 — End: 2018-03-20
  Administered 2018-03-20 (×3): via INTRAVENOUS

## 2018-03-20 MED ORDER — carvedilol (COREG) tablet 6.25 mg
6.25 | Freq: Two times a day (BID) | ORAL | Status: DC
Start: 2018-03-20 — End: 2018-03-20
  Administered 2018-03-20 (×2): 6.25 mg via ORAL

## 2018-03-20 MED ORDER — morphine injection 1-4 mg
2 | INTRAVENOUS | Status: DC | PRN
Start: 2018-03-20 — End: 2018-03-20

## 2018-03-20 MED ORDER — ceFAZolin (ANCEF) 2 g in sodium chloride 0.9 % (NS) 70 mL IVPB
10 | INTRAVENOUS | Status: DC
Start: 2018-03-20 — End: 2018-03-20

## 2018-03-20 MED ORDER — HYDROcodone-acetaminophen (NORCO) 5-325 mg per tablet 1-2 tablet
5-325 | ORAL | Status: DC | PRN
Start: 2018-03-20 — End: 2018-03-20

## 2018-03-20 MED ORDER — calcium-vitamin D 500 mg-200 unit per tablet 2 tablet
500 | Freq: Three times a day (TID) | ORAL | Status: DC
Start: 2018-03-20 — End: 2018-03-20
  Administered 2018-03-20 (×2): 500 via ORAL

## 2018-03-20 MED FILL — VITAMIN D3 25 MCG (1,000 UNIT) TABLET: 25 mcg (1,000 unit) | ORAL | Qty: 3

## 2018-03-20 MED FILL — CALCIUM 500 MG (AS CARBONATE)-VITAMIN D3 5 MCG (200 UNIT) TABLET: 500 mg-200 unit | ORAL | Qty: 2

## 2018-03-20 MED FILL — CEFAZOLIN 10 GRAM SOLUTION FOR INJECTION: 10 g | INTRAMUSCULAR | Qty: 20

## 2018-03-20 MED FILL — ATORVASTATIN 20 MG TABLET: 20 mg | ORAL | Qty: 1

## 2018-03-20 MED FILL — FLULAVAL QUAD 2019-2020 (PF) 60 MCG (15 MCG X 4)/0.5 ML IM SYRINGE: 60 mcg (15 mcg x 4)/0.5 mL | INTRAMUSCULAR | Qty: 1

## 2018-03-20 MED FILL — CALCIUM GLUCONATE 100 MG/ML (10 %) IV VIAL: 100 mg/mL (10%) | INTRAVENOUS | Qty: 30

## 2018-03-20 MED FILL — CARVEDILOL 6.25 MG TABLET: 6.25 mg | ORAL | Qty: 1

## 2018-03-20 MED FILL — VITAMIN D3 25 MCG (1,000 UNIT) TABLET: 25 mcg (1,000 unit) | ORAL | Qty: 1

## 2018-03-20 MED FILL — DOCUSATE SODIUM 250 MG CAPSULE: 250 mg | ORAL | Qty: 1

## 2018-03-20 NOTE — Plan of Care (Signed)
End of Shift Report     Significant Events Post Parathyroidectomy.      0450 Ca draw 9.7.        Relevant   Assessment Findings Transverse wound on neck. Steri strip. Intact    Denies numbness or tingling.     Ambulating to bathroom. Voiding,     Tolerating diet.     Bowels active and passing gas.        Patient/Family  Questions/Concerns None      DC Barriers Medical clearance      Pain Management None

## 2018-03-20 NOTE — Progress Notes (Signed)
Discharge AVS instructions printed and reviewed with patient  Reviewed home incision/wound care  States understanding of the discharge instructions and has paper copy to refer to as needed.    States has all belongings gathered for discharge.  Prescription status if applicable: Given to pt   Transport method out of hospital: Ambulatory. Spouse to drive home

## 2018-03-29 NOTE — Telephone Encounter (Signed)
Chief Complaint/Symptoms:  Rash/blister going all over body    1. Are you currently having this? Yes     2. How long has this been going on?       3. Have you had these symptoms previously?   No    4. Phone number verified:  [phone]     Call transferred to  [facility] sent to triage pool because:  No sd

## 2018-03-29 NOTE — Telephone Encounter (Signed)
Reason for call: Triage and Rash      Current Symptoms: itchy rash all over body,   Denies that this is painful. No open areas, no fever. Denies starting on any new medications.     Length of Symptoms: since last night    Home Care Measures previously tried/used: none    Care Advice: Advised to take a cool bath with baking soda, apply hydrocortisone cream, and take benadryl as indicated on the bottle.     Advised to go to the ER if   Facial swelling, rash becomes painful, fever develops  Advised to call 911 if difficulty breathing.     Disposition: Advised to call on Monday for appt if nothing is helping. Advised to call on call doctor if concerns over the weekend.     Future Appointment: No future appointments.     Jari Sportsman RN

## 2018-04-04 NOTE — Telephone Encounter (Signed)
Pt called in to request a refill of: atorvastatin (LIPITOR) 20 MG tablet  Pharmacy: [facility] [address] [place], [place] - [address]  Callback number: [phone]

## 2018-04-04 NOTE — Telephone Encounter (Signed)
Patient is requesting a refill of atorvastatin, last seen by [facility] on 02/13/2018 , and no FYI restrictions.  Routed pended order to Acquanetta Chain, DO for review.   No future appointments.     BP Readings from Last 3 Encounters:   03/20/18 92/59   03/08/18 132/90   02/13/18 132/90     Pulse Readings from Last 3 Encounters:   03/20/18 86   03/08/18 86   02/13/18 82       Last Labs:    LDL Calculated   Date Value Ref Range Status   02/08/2018 55 0 - 129 mg/dL Final     Triglyceride   Date Value Ref Range Status   02/08/2018 192.0 (H) <=150.0 mg/dL Final     Sodium   Date Value Ref Range Status   03/08/2018 140 135 - 143 mmol/L Final     Potassium   Date Value Ref Range Status   03/08/2018 4.2 3.5 - 5.1 mmol/L Final     AST - Aspartate Aminotransferase   Date Value Ref Range Status   03/08/2018 21 8 - 39 IU/L Final     ALT - Alanine Aminotransferase   Date Value Ref Range Status   03/08/2018 25 7 - 52 IU/L Final     BUN   Date Value Ref Range Status   03/08/2018 18 6 - 23 mg/dL Final     No results found for: HGBA1C, MICROALBUR, [id number]  Glomerular Filtration Rate Estimate   Date Value Ref Range Status   03/06/2016 >60.0 >=60.0 mL/min/1.61m*2 Final     Glomerular Filtration Rate Estimate (Female)   Date Value Ref Range Status   01/11/2018 >60.0 >=60.0 mL/min/1.5m*2 Final

## 2018-04-05 ENCOUNTER — Ambulatory Visit: Admit: 2018-04-05 | Discharge: 2018-05-22

## 2018-04-05 ENCOUNTER — Other Ambulatory Visit: Admit: 2018-04-05

## 2018-04-05 DIAGNOSIS — C183 Malignant neoplasm of hepatic flexure: Secondary | ICD-10-CM

## 2018-04-05 DIAGNOSIS — C189 Malignant neoplasm of colon, unspecified: Secondary | ICD-10-CM

## 2018-04-05 LAB — CBC WITH AUTO DIFFERENTIAL
Basophils %: 1 % (ref 0–2)
Basophils, Absolute: 0.1 10*3/ÂµL (ref 0.0–0.1)
Eosinophils %: 2 % (ref 0–5)
Eosinophils, Absolute: 0.1 10*3/ÂµL (ref 0.0–0.4)
HCT: 39 % (ref 35.0–48.0)
Hemoglobin: 12.9 g/dL (ref 11.7–16.5)
Lymphocytes %: 33 % (ref 20–40)
Lymphocytes, Absolute: 1.8 10*3/ÂµL (ref 1.0–4.0)
MCH: 30.4 pg (ref 28.3–33.3)
MCHC: 33.1 g/dL (ref 32.5–36.0)
MCV: 91.8 fL (ref 81.0–100.0)
MPV: 7.3 fL (ref 6.9–10.0)
Monocytes %: 7 % (ref 2–12)
Monocytes, Absolute: 0.4 10*3/ÂµL (ref 0.2–1.0)
Neutrophils %: 58 % (ref 50–74)
Neutrophils, Absolute: 3.2 10*3/ÂµL (ref 2.0–7.4)
Platelet Count: 279 10*3/ÂµL (ref 150–405)
RBC: 4.24 10*6/ÂµL (ref 3.80–5.60)
RDW: 14.4 % (ref 11.7–16.1)
WBC: 5.5 10*3/ÂµL (ref 4.5–11.0)

## 2018-04-05 LAB — COMPREHENSIVE METABOLIC PANEL
ALT - Alanine Aminotransferase: 14 IU/L (ref 5–33)
AST - Aspartate Aminotransferase: 18 IU/L (ref 5–32)
Albumin/Globulin Ratio: 1.2 (ref 1.2–2.2)
Albumin: 4.1 g/dL (ref 3.5–5.0)
Alkaline Phosphatase: 125 IU/L — ABNORMAL HIGH (ref 35–105)
Anion Gap: 15.1 mmol/L (ref 9.0–18.0)
BUN / Creatinine Ratio: 18.5 (ref 12.0–20.0)
BUN: 15 mg/dL (ref 8–20)
Bilirubin Total: 0.3 mg/dL (ref 0.10–1.70)
CO2 - Carbon Dioxide: 22 mmol/L (ref 17–27)
Calcium: 9.2 mg/dL (ref 8.6–10.6)
Chloride: 102.9 mmol/L (ref 101.0–111.0)
Creatinine: 0.81 mg/dL (ref 0.60–1.30)
Glomerular Filtration Rate Estimate (Female): >60 mL/min/{1.73_m2} (ref >=60.0)
Glucose: 97 mg/dL (ref 74–106)
Osmolality Calculation: 280.1 mOsm/kg (ref 275.0–300.0)
Potassium: 4.06 mmol/L (ref 3.50–5.10)
Protein Total: 7.6 g/dL (ref 6.4–8.2)
Sodium: 140 mmol/L (ref 135–145)

## 2018-04-05 MED ORDER — atorvastatin (LIPITOR) 20 MG tablet
20 | ORAL_TABLET | Freq: Every evening | ORAL | 3 refills | Status: DC
Start: 2018-04-05 — End: 2018-09-20

## 2018-04-05 NOTE — Progress Notes (Signed)
[facility]  [place], [place]  MEDICAL ONCOLOGY PROGRESS NOTE    [name], Brandy [name]  CHART#:  [id number]  ACCT#:  [id number]  PATIENT DOB:  Jul 18, 1955    DATE OF SERVICE:  04/05/2018.    CC:     HISTORY OF PRESENT ILLNESS:  Brandy [name] is doing well.  She had her colonoscopy with Montel Clock, MD, and there were no abnormalities on the colonoscopy. Patient had a well-differentiated, stage II colon cancer, and this was back in 01/2017.  The patient is now 1-1/2 years out and she is doing quite well.  Patient's mother had colon cancer.  Her mother also had ovarian cancer.  She had an aunt who had uterine cancer, another uncle, a maternal uncle and a maternal aunt had cancer, and there were also histories of cancer on her family side.   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  Preincision parathyroid hormone level was 249.  Fifteen minutes post-excision the parathormone level had dropped to 39 for a total reduction of 84%.  Given the significant drop in the identification of a normal right superior parathyroid gland, a left parathyroid exploration was not undertaken.    REVIEW OF SYSTEMS:  Negative for fever, chills, shortness of breath, angina, changes in GI or GU habits.     PHYSICAL EXAMINATION:  NECK:  Well-healed.  There is just a little incision over the right inferior part of the thyroid gland.  The neck is without adenopathy.  CHEST:  Clear.  BREASTS:  Without masses.  AXILLAE:  Without adenopathy.  ABDOMEN:  Soft, obese, without organomegaly.  EXTREMITIES:  Well-developed, without lesions.    LABORATORIES, IMAGING AND ELECTRONIC RECORD:  Blood work today shows that patient's calcium is normal at 9.2.  Her BUN is normal at 15.  Her creatinine is normal at 0.81.  Her electrolytes are normal.  Her albumin is normal.  Her  total protein is normal.  Patient's liver function tests are all normal except for the alk phos that is mildly elevated at 125.  Patient's CBC shows a white count of 5.5, a hemoglobin of 12.9 and hematocrit of 39%.    IMPRESSION:  1. Stage II colon cancer.  2. Post-porcine aortic valve transplant in 2017.  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    PLAN: She will return to see [place] in 7 months and we will check a CBC, a CMP and a CEA.          SWEMA:FOXS                       Rhona Raider Antwine Agosto, MD  D:  04/05/2018 15:33:15            T:  04/08/2018 08:16:01            V/D:  [id number]               E:  ANDER/04/08/2018 10:19:09      C:  Robina Ade MD               1

## 2018-05-03 MED ORDER — carvedilol (COREG) 6.25 MG tablet
6.25 | ORAL_TABLET | Freq: Two times a day (BID) | ORAL | 1 refills | 90.00000 days | Status: DC
Start: 2018-05-03 — End: 2018-09-20

## 2018-05-03 NOTE — Telephone Encounter (Signed)
Patient is requesting a refill of carvedilol, last seen by [facility] on 02/13/2018 , and no FYI restrictions.  Routed pended order to Acquanetta Chain, DO for review.     Future Appointments   Date Time Provider Department Center   05/21/2018  3:20 PM Acquanetta Chain, DO [facility]        BP Readings from Last 3 Encounters:   03/20/18 92/59   03/08/18 132/90   02/13/18 132/90     Pulse Readings from Last 3 Encounters:   03/20/18 86   03/08/18 86   02/13/18 82       Last Labs:    LDL Calculated   Date Value Ref Range Status   02/08/2018 55 0 - 129 mg/dL Final     Triglyceride   Date Value Ref Range Status   02/08/2018 192.0 (H) <=150.0 mg/dL Final     Sodium   Date Value Ref Range Status   04/05/2018 140 135 - 145 mmol/L Final     Potassium   Date Value Ref Range Status   04/05/2018 4.06 3.50 - 5.10 mmol/L Final     AST - Aspartate Aminotransferase   Date Value Ref Range Status   04/05/2018 18 5 - 32 IU/L Final     ALT - Alanine Aminotransferase   Date Value Ref Range Status   04/05/2018 14 5 - 33 IU/L Final     BUN   Date Value Ref Range Status   04/05/2018 15 8 - 20 mg/dL Final     No results found for: HGBA1C, MICROALBUR, MALB24HUR  Glomerular Filtration Rate Estimate   Date Value Ref Range Status   03/06/2016 >60.0 >=60.0 mL/min/1.2m*2 Final     Glomerular Filtration Rate Estimate (Female)   Date Value Ref Range Status   04/05/2018 >60.0 >=60.0 mL/min/1.24m*2 Final

## 2018-05-03 NOTE — Telephone Encounter (Signed)
Pt called in to request a refill of: carvedilol (COREG) 6.25 MG tablet  Pharmacy: [facility] [address] [place], [place] - [address]  Callback number: [phone]

## 2018-05-21 ENCOUNTER — Ambulatory Visit: Admit: 2018-05-21 | Discharge: 2018-05-22

## 2018-05-21 DIAGNOSIS — E213 Hyperparathyroidism, unspecified: Secondary | ICD-10-CM

## 2018-05-21 NOTE — Progress Notes (Signed)
I have seen and examined the patient. In addition to this summary note, I agree with Dr. Kandice Hams separate note from 05/21/2018.    Summary History:  Brandy [name] is 62 y.o. year old patient with significant past medical history of colon cancer, s/p parathyroidectomy, aortic valve replacement presenting for dental pain and back pain.    HPI-Pertinent  Follow up for parathyroidectomy 03/2018    Colon cancer  Last colonoscopy clear. Patient with no complaints of     Doing well, follow up labs. Taking vitamin D and calcium supplement. Slight tenderness at site of surgery, but doing well.     History of valve replacement in 2017. Currently on ASA. Some dyspnea on exertion, but stable    Dentition  No history of dental care since 1970's. Currently on [employer]. Patient reports increased pain.    Active Ambulatory Problems     Diagnosis Date Noted   . Demand ischemia (HCC) 01/21/2016   . Pleural effusion 01/21/2016   . Obesity 01/21/2016   . Iron deficiency anemia 01/21/2016   . Hilar lymphadenopathy- bilataeral  01/21/2016   . Liver lesion- small hypodense lesion around falciform lig 01/21/2016   . Non-ischemic cardiomyopathy (HCC) 01/23/2016   . 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 Aortic valve disorder 02/17/2016   . 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 Colon cancer (HCC) 12/28/2016   . Hyperparathyroidism (HCC) 02/12/2018   . Back pain, thoracic 02/13/2018     Resolved Ambulatory Problems     Diagnosis Date Noted   . Elevated brain natriuretic peptide (BNP) level 01/21/2016   . Tachycardia 01/21/2016   . Acute respiratory failure with hypoxia (HCC) 01/21/2016   . Hypotension 01/21/2016   . Mediastinal lymphadenopathy 01/21/2016   . Chest pain 01/21/2016   . Dyspnea and respiratory abnormality 01/21/2016   . Elevated troponin 01/21/2016   . Abnormal EKG 01/21/2016   . Opacity of lung on imaging study 01/21/2016   . Acute systolic heart failure (HCC) 01/22/2016   . Critical  aortic valve stenosis 01/23/2016   . Respiratory failure (HCC) 01/23/2016   . Acute renal failure with tubular necrosis (HCC) 02/13/2016   . Impaired gait and mobility 02/17/2016   . Impaired activities of daily living 02/17/2016   . Peripheral edema 02/17/2016   . Blood loss anemia 02/17/2016   . Hypokalemia 02/17/2016   . Gangrene of foot (HCC) 03/08/2016   . Screening for colon cancer 10/04/2016   . Colonic mass 11/28/2016   . Encounter for screening colonoscopy 12/05/2017     Past Medical History:   Diagnosis Date   . Arrhythmia    . CHF (congestive heart failure) (HCC)    . History of blood transfusion    . HIT (heparin-induced thrombocytopenia) (HCC)    . Hyperlipidemia    . Thyroid disease         Current Outpatient Medications on File Prior to Visit   Medication Sig Dispense Refill   . acetaminophen (TYLENOL) 650 MG CR tablet Take 1 tablet by mouth every 8 hours as needed for Pain. 30 tablet 0   . ascorbic acid (VITAMIN C ORAL) Take by mouth daily.     Marland Kitchen atorvastatin (LIPITOR) 20 MG tablet Take 1 tablet by mouth every night at bedtime. 90 tablet 3   . calcium-vitamin D 500 mg-200 unit per tablet Take 2 tablets by mouth every 8 hours. After 2 days, decrease to 1 tablet every  8 hours. 200 tablet 1   . carvedilol (COREG) 6.25 MG tablet Take 1 tablet by mouth 2 times daily with meals. 180 tablet 1   . cholecalciferol, vitamin D3, (VITAMIN D3) 1,000 unit tablet Take 4,000 Units by mouth daily.     Marland Kitchen docusate sodium (COLACE) 100 MG capsule Take 1 capsule by mouth 2 times daily.     . traMADol (ULTRAM) 50 mg tablet TAKE 1 TABLET BY MOUTH THREE TIMES DAILY AS NEEDED FOR PAIN  1     No current facility-administered medications on file prior to visit.         Relevant Subjective:  Complete ROS negative except for HPI    Relevant Objective:  Vitals:    05/21/18 1508   BP: 152/86   BP Location: Left arm   Patient Position: Sitting   Pulse: 72   Resp: 18   Temp: 36.7 ?C (98 ?F)   TempSrc: Temporal   SpO2: 93%  Comment:  Room air   Weight: 261 lb (118.4 kg)      Dental: Multiple cracked teeth, poor  Card: S3 present heart murmur  Lungs: Clear  Abd: Benign    Assessment:  Brandy [name] is 62 y.o. year old patient with significant past medical history of colon cancer, s/p parathyroidectomy, aortic valve replacement presenting for dental pain and back pain.    Plan:  Parathyroidectomy  - Follow up with general surgery    Aortic valve replacement  - Follow up with cards    Dental care  - Referral to [facility] approved clinic    History of colon cancer  - Last colonoscopy clear, follow up with next colonoscopy    Back pain   - Stretches given to patient  - Offered PT, but due to distance, patient deferring

## 2018-05-21 NOTE — Patient Instructions (Signed)
Low Back Sprain Rehab  Ask your health care provider which exercises are safe for you. Do exercises exactly as told by your health care provider and adjust them as directed. It is normal to feel mild stretching, pulling, tightness, or discomfort as you do these exercises, but you should stop right away if you feel sudden pain or your pain gets worse. Do not begin these exercises until told by your health care provider.  Stretching and range of motion exercises  These exercises warm up your muscles and joints and improve the movement and flexibility of your back. These exercises also help to relieve pain, numbness, and tingling.  Exercise A: Lumbar rotation    1. Lie on your back on a firm surface and bend your knees.  2. Straighten your arms out to your sides so each arm forms an "L" shape with a side of your body (a 90 degree angle).  3. Slowly move both of your knees to one side of your body until you feel a stretch in your lower back. Try not to let your shoulders move off of the floor.  4. Hold for __________ seconds.  5. Tense your abdominal muscles and slowly move your knees back to the starting position.  6. Repeat this exercise on the other side of your body.  Repeat __________ times. Complete this exercise __________ times a day.  Exercise B: Prone extension on elbows    1. Lie on your abdomen on a firm surface.  2. Prop yourself up on your elbows.  3. Use your arms to help lift your chest up until you feel a gentle stretch in your abdomen and your lower back.  ? This will place some of your body weight on your elbows. If this is uncomfortable, try stacking pillows under your chest.  ? Your hips should stay down, against the surface that you are lying on. Keep your hip and back muscles relaxed.  4. Hold for __________ seconds.  5. Slowly relax your upper body and return to the starting position.  Repeat __________ times. Complete this exercise __________ times a day.  Strengthening exercises  These  exercises build strength and endurance in your back. Endurance is the ability to use your muscles for a long time, even after they get tired.  Exercise C: Pelvic tilt  1. Lie on your back on a firm surface. Bend your knees and keep your feet flat.  2. Tense your abdominal muscles. Tip your pelvis up toward the ceiling and flatten your lower back into the floor.  ? To help with this exercise, you may place a small towel under your lower back and try to push your back into the towel.  3. Hold for __________ seconds.  4. Let your muscles relax completely before you repeat this exercise.  Repeat __________ times. Complete this exercise __________ times a day.  Exercise D: Alternating arm and leg raises    1. Get on your hands and knees on a firm surface. If you are on a hard floor, you may want to use padding to cushion your knees, such as an exercise mat.  2. Line up your arms and legs. Your hands should be below your shoulders, and your knees should be below your hips.  3. Lift your left leg behind you. At the same time, raise your right arm and straighten it in front of you.  ? Do not lift your leg higher than your hip.  ? Do not lift your arm   higher than your shoulder.  ? Keep your abdominal and back muscles tight.  ? Keep your hips facing the ground.  ? Do not arch your back.  ? Keep your balance carefully, and do not hold your breath.  4. Hold for __________ seconds.  5. Slowly return to the starting position and repeat with your right leg and your left arm.  Repeat __________ times. Complete this exercise __________ times a day.  Exercise E: Abdominal set with straight leg raise    1. Lie on your back on a firm surface.  2. Bend one of your knees and keep your other leg straight.  3. Tense your abdominal muscles and lift your straight leg up, 4-6 inches (10-15 cm) off the ground.  4. Keep your abdominal muscles tight and hold for __________ seconds.  ? Do not hold your breath.  ? Do not arch your back. Keep it  flat against the ground.  5. Keep your abdominal muscles tense as you slowly lower your leg back to the starting position.  6. Repeat with your other leg.  Repeat __________ times. Complete this exercise __________ times a day.  Posture and body mechanics    Body mechanics refers to the movements and positions of your body while you do your daily activities. Posture is part of body mechanics. Good posture and healthy body mechanics can help to relieve stress in your body's tissues and joints. Good posture means that your spine is in its natural S-curve position (your spine is neutral), your shoulders are pulled back slightly, and your head is not tipped forward. The following are general guidelines for applying improved posture and body mechanics to your everyday activities.  Standing    · When standing, keep your spine neutral and your feet about hip-width apart. Keep a slight bend in your knees. Your ears, shoulders, and hips should line up.  · When you do a task in which you stand in one place for a long time, place one foot up on a stable object that is 2-4 inches (5-10 cm) high, such as a footstool. This helps keep your spine neutral.  Sitting    · When sitting, keep your spine neutral and keep your feet flat on the floor. Use a footrest, if necessary, and keep your thighs parallel to the floor. Avoid rounding your shoulders, and avoid tilting your head forward.  · When working at a desk or a computer, keep your desk at a height where your hands are slightly lower than your elbows. Slide your chair under your desk so you are close enough to maintain good posture.  · When working at a computer, place your monitor at a height where you are looking straight ahead and you do not have to tilt your head forward or downward to look at the screen.  Resting    · When lying down and resting, avoid positions that are most painful for you.  · If you have pain with activities such as sitting, bending, stooping, or squatting  (flexion-based activities), lie in a position in which your body does not bend very much. For example, avoid curling up on your side with your arms and knees near your chest (fetal position).  · If you have pain with activities such as standing for a long time or reaching with your arms (extension-based activities), lie with your spine in a neutral position and bend your knees slightly. Try the following positions:  · Lying on your side with a   pillow between your knees.  · Lying on your back with a pillow under your knees.  Lifting    · When lifting objects, keep your feet at least shoulder-width apart and tighten your abdominal muscles.  · Bend your knees and hips and keep your spine neutral. It is important to lift using the strength of your legs, not your back. Do not lock your knees straight out.  · Always ask for help to lift heavy or awkward objects.  This information is not intended to replace advice given to you by your health care provider. Make sure you discuss any questions you have with your health care provider.  Document Released: 05/29/2005 Document Revised: 02/03/2016 Document Reviewed: 03/10/2015  Elsevier Interactive Patient Education © 2019 [employer]

## 2018-05-21 NOTE — Progress Notes (Signed)
History of Present Illness  Brandy [name] is a 62 y.o. female who presents today for follow up post-parathyroidectomy on 10/8.    Had parathyroidectomy on 10/8. Has been seeing Dr. Michiel Sites for follow up for this any for her colon cancer. Jan 21 she has follow up appointment scheduled with her surgeon. She is now taking vit D and calcium 500 each since her surgery. She denies any muscle cramping. She has some mild pain at the incision site but otherwise has not had any complications. Her labs, drawn by Dr. Michiel Sites in November, all look remarkable.     She still sees Dr. Rockie Neighbours for her for her aoritic valve replacement in August 2017 for severe bicuspid aortic valve stenosis. She does have some mild dyspnea on exertion but no more than when she had her sugery back in 2017. She has restarted her aspirin since her surgery.     Back Pain: Still has significnat back, for which she takes tramadol and tylenol, which do help but she tries to take them sparingly.     Would like info on dentists in town for toothache. Has not seen a dentist since 1970.     Reviewed Problem List, Past Medical History, Surgical History, Family History, Social history and Allergies.       Review of Systems:  Review of Systems as documented in HPI.  Outpatient Medications Marked as Taking for the 05/21/18 encounter (Office Visit) with Acquanetta Chain, DO   Medication Sig Dispense Refill   . acetaminophen (TYLENOL) 650 MG CR tablet Take 1 tablet by mouth every 8 hours as needed for Pain. 30 tablet 0   . ascorbic acid (VITAMIN C ORAL) Take by mouth daily.     Marland Kitchen atorvastatin (LIPITOR) 20 MG tablet Take 1 tablet by mouth every night at bedtime. 90 tablet 3   . calcium-vitamin D 500 mg-200 unit per tablet Take 2 tablets by mouth every 8 hours. After 2 days, decrease to 1 tablet every 8 hours. 200 tablet 1   . carvedilol (COREG) 6.25 MG tablet Take 1 tablet by mouth 2 times daily with meals. 180 tablet 1   . cholecalciferol, vitamin D3, (VITAMIN  D3) 1,000 unit tablet Take 4,000 Units by mouth daily.     Marland Kitchen docusate sodium (COLACE) 100 MG capsule Take 1 capsule by mouth 2 times daily.     . traMADol (ULTRAM) 50 mg tablet TAKE 1 TABLET BY MOUTH THREE TIMES DAILY AS NEEDED FOR PAIN  1       Physical Exam:  Vitals:    05/21/18 1508   BP: 152/86   BP Location: Left arm   Patient Position: Sitting   Pulse: 72   Resp: 18   Temp: 36.7 ?C (98 ?F)   TempSrc: Temporal   SpO2: 93%   Weight: 261 lb (118.4 kg)   Body mass index is 46.23 kg/m?Marland Kitchen   Physical Exam  HENT:      Mouth/Throat:      Comments: Mouth: very poor dentition with multiple cracked teeth and missing teeth. No erythema or abscess visualized on inspection.   Neck:      Comments: Incision site on neck clean, dry, and non-erythematous. Non-tender to palpation.  Cardiovascular:      Heart sounds: Murmur (systolic) present. Gallop present. S3 sounds present.    Pulmonary:      Effort: Pulmonary effort is normal. No respiratory distress.      Breath sounds: Normal breath sounds.   Abdominal:  General: Abdomen is flat. Bowel sounds are normal.      Tenderness: There is no tenderness.   Musculoskeletal:      Right lower leg: No edema.      Left lower leg: No edema.   Skin:     General: Skin is warm and dry.   Psychiatric:         Mood and Affect: Mood normal.         Behavior: Behavior normal.           Assessment and Plan:   1. Hyperparathyroidism [facility])  Following with Dr. Michiel Sites and her primary surgeon Dr. Jenelle Mages. Labs thus far wnl. Taking 500 calcium and vit D.   2. Chronic bilateral thoracic back pain  Pt denied physical therapy and OMT because she can't afford to come into town that often. I gave her some stretches for her back to do daily. I offered her a lidocaine patch, in addition to the tylenol and tramadol, which she denied saying she uses bengay which works for her.   3. Aortic valve disorder  Following with Dr. Rockie Neighbours, appears stable.   4. Malignant neoplasm of colon, unspecified part of  colon (HCC)  Last colonoscopy 8/6 with Dr. Oran Rein with no significant findings and recommended 3-5 year follow up.   5. Dental Caries  Pt at high risk of infection of replacement heart valve if she has a tooth infection. She does have occasional shooting pain but no acute infection seen on exam. Has not seen a dentist since 1970. Gave her a list of [facility] approved dentists in town.     No problem-specific Assessment & Plan notes found for this encounter.      Follow Up:  Return in about 4 months (around 09/20/2018).    THIS PATIENT WAS DISCUSSED WITH:  Dr. Manson Passey, who agrees with the plan and did see the patient.    Acquanetta Chain, DO    Please note: this document was created with the assistance of voice-to-text technology. Effort has been made to minimize transcription errors. Please allow for homonyms and other similar transcription errors, such as she in place of he and vice versa.

## 2018-05-21 NOTE — Progress Notes (Signed)
Chief Complaint   Patient presents with   . Follow-up     Surgery. 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 Pt reports that she feels good. pt is to f/u in [place] on January 21.   . Dental Pain     Pt has been using cottom swab and using epson salt.        Vitals:    05/21/18 1508   BP: 152/86   BP Location: Left arm   Patient Position: Sitting   Pulse: 72   Resp: 18   Temp: 36.7 ?C (98 ?F)   TempSrc: Temporal   SpO2: 93%   Weight: 261 lb (118.4 kg)       Smoking Counseling given: Yes  Comment: SBIRT 02/13/2018 [facility] Father smoked in the home.       Annual Screening:        Annual screening not completed at this visit .     No exam data present                Health Maintenance Due   Topic Date Due   . HIV SCREENING  03/31/1973

## 2018-06-25 ENCOUNTER — Other Ambulatory Visit: Admit: 2018-06-25

## 2018-06-25 DIAGNOSIS — E210 Primary hyperparathyroidism: Secondary | ICD-10-CM

## 2018-06-25 LAB — COMPREHENSIVE METABOLIC PANEL
ALT - Alanine Aminotransferase: 16 IU/L (ref 5–33)
AST - Aspartate Aminotransferase: 19 IU/L (ref 5–32)
Albumin/Globulin Ratio: 1 — ABNORMAL LOW (ref 1.2–2.2)
Albumin: 4.2 g/dL (ref 3.5–5.0)
Alkaline Phosphatase: 108 IU/L — ABNORMAL HIGH (ref 35–105)
Anion Gap: 10.3 mmol/L (ref 9.0–18.0)
BUN / Creatinine Ratio: 17.9 (ref 12.0–20.0)
BUN: 17 mg/dL (ref 8–20)
Bilirubin Total: 0.4 mg/dL (ref 0.10–1.70)
CO2 - Carbon Dioxide: 25 mmol/L (ref 17–27)
Calcium: 10.1 mg/dL (ref 8.6–10.6)
Chloride: 102.7 mmol/L (ref 101.0–111.0)
Creatinine: 0.95 mg/dL (ref 0.60–1.30)
Glomerular Filtration Rate Estimate (Female): 59.4 mL/min/{1.73_m2} — ABNORMAL LOW (ref >=60.0)
Glucose: 92 mg/dL (ref 74–106)
Osmolality Calculation: 276.9 mOsm/kg (ref 275.0–300.0)
Potassium: 3.91 mmol/L (ref 3.50–5.10)
Protein Total: 8.3 g/dL — ABNORMAL HIGH (ref 6.4–8.2)
Sodium: 138 mmol/L (ref 135–145)

## 2018-06-25 LAB — PTH, INTACT: PTH: 51.7 pg/mL (ref 12.0–88.0)

## 2018-06-25 LAB — PHOSPHORUS: Phosphorus: 3.6 mg/dL (ref 2.5–4.7)

## 2018-06-25 LAB — 25-HYDROXYVITAMIN D, IMMUNOASSAY (SLM): Vitamin D, 25-OH Total: 24.9 ng/mL — ABNORMAL LOW

## 2018-09-20 ENCOUNTER — Ambulatory Visit: Admit: 2018-09-20 | Discharge: 2018-09-20

## 2018-09-20 DIAGNOSIS — G8929 Other chronic pain: Secondary | ICD-10-CM

## 2018-09-20 MED ORDER — atorvastatin (LIPITOR) 20 MG tablet
20 | ORAL_TABLET | Freq: Every evening | ORAL | 3 refills | Status: DC
Start: 2018-09-20 — End: 2019-10-14

## 2018-09-20 MED ORDER — EPINEPHrine (EPIPEN) 0.3 mg/0.3 mL AtIn injection
0.3 | Freq: Once | INTRAMUSCULAR | 0 refills | 22.50000 days | Status: DC
Start: 2018-09-20 — End: 2022-02-27

## 2018-09-20 MED ORDER — carvediloL (COREG) 6.25 MG tablet
6.25 | ORAL_TABLET | Freq: Two times a day (BID) | ORAL | 1 refills | 90.00000 days | Status: DC
Start: 2018-09-20 — End: 2019-05-12

## 2018-09-20 MED ORDER — traMADoL (ULTRAM) 50 mg tablet
50 | ORAL_TABLET | ORAL | 1 refills | 30.00000 days | Status: DC
Start: 2018-09-20 — End: 2019-02-04

## 2018-09-20 NOTE — Telephone Encounter (Signed)
Faxed Rx for Tramadol to [facility]. Dated 09-20-18

## 2018-09-20 NOTE — Progress Notes (Signed)
Phone Visit Template    Visit start: 9:30 AM  Visit end: 10:00 AM    . I have expressed that a phone visit is a billable service. The patient consented to this service and possibility of cost or coinsurance (20% for Medicare A/B).   . This service is not related to a face to face encounter or procedure within the last 7 days  . This service is not expected to lead to an office visit or procedure in the next 24 hours.    Assessment / Plan    1. Chronic bilateral thoracic back pain  -Refill tramadol, low risk for abuse/addiction due to limited use.   - Consider low dose (300 mg HS) gabapentin next time.     2. Non-ischemic cardiomyopathy (HCC)  Follow up with cardiology scheduled for this summer. No worsening sob.   - Refill coreg and lipitor.   Overview:  ? 02/2016 TTE: Sinus rhythm. Moderate concentric left ventricular hypertrophy (LVH).  Normal left ventricular (LV) size.  Below normal function.  Ejection fraction (EF) 55-60%. Normal right ventricular chamber size and function.  No evidence of pulmonary arterial hypertension but the inferior vena cava (IVC) appears enlarged, suggesting a central venous pressure (CVP) of 10 mmHg. 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 No significant pericardial or pleural effusion. Compared to prior echo from February 02, 2016, left ventricular (LV) function has improved. Right ventricular (RV) chamber size and function nearly normalized.  ? 01/2016 cath: Functionally Normal Coronary Anatomy  and Aortic Valve Disease by Dr. Crissie Figures  ? Medical management: ASA, lipitor, coreg, ?ace  ? 09/08/16 ASCVD 3.3% on lipitor 20 mg  ? 02/2018 TTE: Preserved EF of 69% with mild left ventricular hypertrophy. Mild right heart chambers enlargement with normal pulmonary artery pressures, mild to moderate left atrial enlargement. Mild dilation of ascending aorta.   ?   3. Aortic valve disorder  Overview:  ? S/p AVR 23mm MAGNA edwards  ? 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 On anticoagulation. INR goal 2-3    4. Hyperlipidemia, unspecified hyperlipidemia type  -     atorvastatin (LIPITOR) 20 MG tablet; Take 1 tablet by mouth every night at bedtime.  Dispense: 90 tablet; Refill: 3    5. Anaphylaxis, initial encounter  - Will prescribe epi pen for unknown source of anaphylaxis. Educated on how to use epi pen and when to use it if signs/symptoms of throat swelling and/or difficulty breathing during anaphylaxis. Present to ED if she experiences this again.   - Consider daily Claritin next visit.   Overview:  Unknown substance, epipen prescribed as precaution.     Orders:  -     EPINEPHrine (EPIPEN) 0.3 mg/0.3 mL AtIn injection; Inject 0.3 mL into the muscle once for 1 dose.  Dispense: 0.3 mL; Refill: 0    6. Dental caries  Awaiting visit due to COVID. Has prophylaxis ordered.   Other orders  -     carvediloL (COREG) 6.25 MG tablet; Take 1 tablet by mouth 2 times daily with meals.  Dispense: 180 tablet; Refill: 1  -     traMADoL (ULTRAM) 50 mg tablet; TAKE 1 TABLET BY MOUTH THREE TIMES DAILY AS NEEDED FOR PAIN  Dispense: 30 tablet; Refill: 1        Subjective    CC: general follow up    #Dental Carries: went to dentist  on 05/27/18 and has follow up on June 24th (coronavirus). Using orajel for comfort but now doesn't need it anymore. Has amoxicillin for prophylaxis before dentist visit.     #Allergic Reaction to something at her neighbors. Took benadryl and symptoms resolved. Sometime in December. Did not have any difficulty breathing, only blister like rash. Does not know what caused it. Has had hx of allergic reactions like this.     #Parathyroidectomy: Labs stable in January, will follow up in July/August. Still taking Vit D 2000 IU and calcium 200. Dr. Chyrel Masson, who did parathyroidectomy, has signed off and she will continue to follow with [place].     #Back Pain: Still has significant back  pain. Ran out of Tramadol back in February. Was only taking about 2-4x per week, depending on [place]. Has been using ibuprofen in the interim, about 1 pill every other day.     #Cardiomyopathy: Will get short of breath with pretty significant physical activity. She regularly moves 1/2 ton of hay and has to take more breaks than usually but is still able to do work. [place] apt this summer.      Objective    Normal mentation, train of thought.  Linear, logical reasoning.  Speaking in full sentences.      Labs / Studies reviewed: Will order labs for parathyroid at next visit    Acquanetta Chain, DO  09/20/2018 8:17 AM

## 2018-09-22 NOTE — Progress Notes (Signed)
Jouri Threat 09/20/18    Discussion with resident. Agree with assessment, note and plan. Follow-up for chronic low back pain with very modest tramadol use about 2 to 4 tablets per week. Also chronic dental problems with an office visit for non-infectious reasons pending until after Covid crisis. History of parathyroid resection for hyperparathyroidism. Brief follow-up (cardiology also pending) for cardiomyopathy in association with aortic valve (replaced) dysfunction.    Plan for EpiPen because of history of anaphylaxis. Also discussion about environmental allergy treatments, laboratories to follow up parathyroid. Okay to refill modest tramadol.    Susanne Greenhouse MD FAAFP

## 2018-10-04 ENCOUNTER — Ambulatory Visit: Admit: 2018-10-04 | Discharge: 2018-10-15

## 2018-10-04 ENCOUNTER — Other Ambulatory Visit: Admit: 2018-10-04

## 2018-10-04 DIAGNOSIS — C183 Malignant neoplasm of hepatic flexure: Secondary | ICD-10-CM

## 2018-10-04 DIAGNOSIS — D351 Benign neoplasm of parathyroid gland: Secondary | ICD-10-CM

## 2018-10-04 LAB — CBC WITH AUTO DIFFERENTIAL
Basophils %: 1 % (ref 0–2)
Basophils, Absolute: 0 10*3/ÂµL (ref 0.0–0.1)
Eosinophils %: 1 % (ref 0–5)
Eosinophils, Absolute: 0.1 10*3/ÂµL (ref 0.0–0.4)
HCT: 39.9 % (ref 35.0–48.0)
Hemoglobin: 13.7 g/dL (ref 11.7–16.5)
Lymphocytes %: 28 % (ref 20–40)
Lymphocytes, Absolute: 2 10*3/ÂµL (ref 1.0–4.0)
MCH: 31.2 pg (ref 28.3–33.3)
MCHC: 34.2 g/dL (ref 32.5–36.0)
MCV: 91 fL (ref 81.0–100.0)
MPV: 7.2 fL (ref 6.9–10.0)
Monocytes %: 6 % (ref 2–12)
Monocytes, Absolute: 0.4 10*3/ÂµL (ref 0.2–1.0)
Neutrophils %: 64 % (ref 50–74)
Neutrophils, Absolute: 4.6 10*3/ÂµL (ref 2.0–7.4)
Platelet Count: 247 10*3/ÂµL (ref 150–405)
RBC: 4.39 10*6/ÂµL (ref 3.80–5.60)
RDW: 16.3 % — ABNORMAL HIGH (ref 11.7–16.1)
WBC: 7.2 10*3/ÂµL (ref 4.5–11.0)

## 2018-10-04 LAB — COMPREHENSIVE METABOLIC PANEL
ALT - Alanine Aminotransferase: 24 IU/L (ref 5–33)
AST - Aspartate Aminotransferase: 25 IU/L (ref 5–32)
Albumin/Globulin Ratio: 1 — ABNORMAL LOW (ref 1.2–2.2)
Albumin: 4.3 g/dL (ref 3.5–5.0)
Alkaline Phosphatase: 103 IU/L (ref 35–105)
Anion Gap: 13.8 mmol/L (ref 9.0–18.0)
BUN / Creatinine Ratio: 20.7 — ABNORMAL HIGH (ref 12.0–20.0)
BUN: 18 mg/dL (ref 8–20)
Bilirubin Total: 0.5 mg/dL (ref 0.10–1.70)
CO2 - Carbon Dioxide: 25 mmol/L (ref 17–27)
Calcium: 9.9 mg/dL (ref 8.6–10.6)
Chloride: 102.2 mmol/L (ref 101.0–111.0)
Creatinine: 0.87 mg/dL (ref 0.60–1.30)
Glomerular Filtration Rate Estimate (Female): >60 mL/min/{1.73_m2} (ref >=60.0)
Glucose: 90 mg/dL (ref 74–106)
Osmolality Calculation: 282.7 mOsm/kg (ref 275.0–300.0)
Potassium: 4.09 mmol/L (ref 3.50–5.10)
Protein Total: 8.4 g/dL — ABNORMAL HIGH (ref 6.4–8.2)
Sodium: 141 mmol/L (ref 135–145)

## 2018-10-04 LAB — CEA: CEA: 1.3 ng/mL (ref 0.0–5.0)

## 2018-10-04 NOTE — Progress Notes (Signed)
[facility]  [place], [place]  MEDICAL ONCOLOGY PROGRESS NOTE    PATIENT:  Brandy, [name]  CHART#:  [id number]  ACCT#:  [id number]  PATIENT DOB:  12-05-1955    DATE OF SERVICE:  10/04/2018.    DIAGNOSIS:  Colon cancer.    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  The tumor was a T3 lesion that penetrated through the wall into the perirectal tissues but there was no lymph node involvement.  Patient also has a fairly large hiatal hernia and patient had marked hypochromic microcytic anemia due to both her colon cancer and probably also her hiatal hernia.  The patient underwent aortic valve replacement over in [place], [place], and that was back in 2017.  When she was evaluated for her colon cancer, a CT scan of the chest showed a 1.4 cm nodule in the left upper lobe.  Patient had a second CT scan and that was done on 07/14/2017 and the nodule in the left upper lobe was unchanged and the radiologist was of the opinion that it looked like a post-inflammatory nodule and was not going to change.  The patient is a nonsmoker.  In 08/2017 the patient had elevated calcium.  Her serum parathormone was elevated and the patient's thyroid scans showed that she had a nodule.  The patient was referred over to [facility] and saw, I believe, P. Larae Grooms, MD, and underwent resection of her parathyroid gland back in 02/2018.  Patient is doing well.    REVIEW OF SYSTEMS:  The patient has tried to lose weight and her weight is down from 266 to 255.  She is feeling good.  She continues to have low back pain.  She is always somewhat worried about her health.  She is not having any dyspnea or shortness of breath.  Her appetite is good.  She has not been ill or had any problems since we last saw her and that was back about 6 months ago.  Patient is the mother of 4 grown children and they  have all been doing well.  She lives with her husband who is somewhat older than herself and has some problems with his left hip and his back and he walks now with a cane.    PHYSICAL EXAMINATION:    VITAL SIGNS:  The patient is 5 feet 3 inches, she weighs 255.  Her O2 sats are 95%.  Her pulse is 80 and regular.  Her temperature is 98, her blood pressure is 118/61.    GENERAL:  The patient has somewhat of a ruddy complexion.  She has very fair skin and grayish brown hair.  NECK:  Without adenopathy.  CHEST:  Clear.  CARDIOVASCULAR:  She has a regular rate and rhythm without murmurs.  BREASTS:  Large and symmetrical.  ABDOMEN:  Soft, obese without organomegaly or tenderness.  EXTREMITIES:  Without edema or lesions.    LABORATORIES, IMAGING AND ELECTRONIC RECORDS:  The patient's hemoglobin is 13.7, her hematocrit is 39.9, her white count is 7.2, her platelet count is normal, her differential is normal.  Her electrolytes are normal.  Her calcium is 9.9.  Her BUN and creatinine are normal.  Her total protein is elevated at 8.4.  Her albumin is 4.3.  Her liver function tests are normal.  Her GFR is greater than 60.  Her CEA is 1.3.    IMPRESSION:  1.  Post parathyroid adenoma, doing well.   2.  Post colon cancer resected in 2017, a hemicolectomy, stage II, no evidence of disease.  3.  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  4.  Obesity.  5.  Left upper lobe nodule, stable, noted for the first time in 12/2016.    PLAN:  The patient will return to see [place] in 6 months with a CBC, CMP and a CT scan of the chest.    SWEMA:BECKDA                     Rhona Raider. Traeton Bordas, MD  D:  10/04/2018 16:02:49            T:  10/05/2018 07:06:59            V/D:  [id number]               E:  /                                             1

## 2018-12-06 NOTE — Telephone Encounter (Signed)
Pt called back and is scheduled

## 2018-12-06 NOTE — Telephone Encounter (Signed)
Called patient to schedule 3 mo f/u off of wait list. No answer. LMTCB.    If patient calls back please schedule with Lynne Leader, or any resident on Dr.Price's team.MK

## 2018-12-18 ENCOUNTER — Ambulatory Visit: Admit: 2018-12-18 | Discharge: 2018-12-18

## 2018-12-18 DIAGNOSIS — Z1239 Encounter for other screening for malignant neoplasm of breast: Secondary | ICD-10-CM

## 2018-12-18 NOTE — Telephone Encounter (Signed)
Spoke with patient about the message below. Patient verbalized understanding and has no further questions at this time.

## 2018-12-18 NOTE — Telephone Encounter (Signed)
-----   Message from Deirdre Priest, MD sent at 12/18/2018  9:09 AM PDT -----  Please get 3 months f/u with PCP.  Thank you!  Diannia Ruder

## 2018-12-18 NOTE — Telephone Encounter (Signed)
Patient has been placed on wait list for this appointment. MK

## 2018-12-18 NOTE — Telephone Encounter (Signed)
-----   Message from Deirdre Priest, MD sent at 12/18/2018 12:31 PM PDT -----  Regarding: mammogram  Could you please call [place] and let her know that we will have to start with a mammogram first?  She might need a follow-up ultrasound.  Could you also make sure that I ordered the mammogram with the correct diagnosis code?  I always forget which 1 is the right 1 to use.   Thank you!  Diannia Ruder

## 2018-12-18 NOTE — Progress Notes (Addendum)
Telemedicine Visit Template    Visit start: 8:51 AM  Visit end: 9:10 AM    Patient's identity was confirmed with two unique identifiers.    I have expressed that a tele-health visit is a billable service. The patient consented to this virtual visit, including the possibility of cost or coinsurance.    This visit was performed over telephone to reduce patient exposure to COVID-19.    Assessment / Plan    Problem List Items Addressed This Visit     None      Visit Diagnoses     Screening for breast cancer    -  Primary    Relevant Orders    Mammography tomo screening bilateral digital w cad        Bloody stools: Resolved.  Patient was advised to talk with [facility] if she has recurrence of bloody stools again.  Concern is for recurrence of colon cancer.    Dental pain: Encouraged patient to call cardiology office to request that documentation so she can get this extraction done.    Chronic pain: Reports appropriate use of tramadol.  May need DAU at next refill.    Adenomacarcinoma of colon: with FmHx ovarian, breast, and colon cancers. There is no mention that Lynch syndrome has been investigated in her chart that I can see. Uncertain whether patient needs screening for ovarian cancer with pelvic US. Her CT abd/pelvis show no signs of ovarian mass, but she has never had NM scan that I can see. Messaged her PCP, and recommended that he consider this and talk with patient to see what made most sense for her.  Deirdre Priest, MD      Subjective    Chief Complaint:  Follow-up      HPI:  Brandy [name] is a 63 y.o. female with PMH colon adenocarcinoma, nonischemic cardiomyopathy, aortic valve disorder, obesity, hyperparathyroidism, chronic pain who presents today for f/u.    Patient reports that she has been doing pretty well at home. She had some bloody discharge in her stool. It happened 3x and then not since. She has a h/o CRC and just followed up at the [facility] 2-3 months ago and everything looked ok. Her next appt is not  for another 6 mos.     Dental pain: received Abx at the dentist, and now they are waiting for a dental extraction, but she is waiting for a letter from her cardiologist to clear her from that. The appt has been rescheduled several times and now is scheduled for 7/13.    # chronic pain: Uses tramadol about 2x/wk. She tries to take ibuprofen before taking tramadol.     #Screening for breast cancer: last was done in 2018. The mammogram incompletely evaluated the breast tissue so she required [place] which was benign  - FmHx: paternal aunt had breast cancer, ovarian cancer on her mom's side, and CRC in her mom    ROS:  Review of Systems   Constitutional: Negative for chills and fever.   Respiratory: Negative for cough and shortness of breath.    Cardiovascular: Positive for palpitations (once in a while feels a flutter). Negative for chest pain and leg swelling.   Gastrointestinal: Positive for blood in stool (last month). Negative for abdominal pain, constipation and diarrhea.     Medical / Surgical, Family History, Medications and Allergies   Pertinent updates: FmHx cancers, as above.      Objective    Normal mentation, train of thought.  Linear,  logical reasoning.  Speaking in full sentences.  No coughing. Cheerful affect.    Labs / Studies reviewed: CT abd/pelvis without evidence of metastatic spread.  Normal screening colonoscopy 01/2018.    Deirdre Priest, MD  12/18/2018 8:51 AM

## 2018-12-18 NOTE — Progress Notes (Signed)
Lilac was discussed and reviewed with resident at the time of visit    My findings concur with resident's evaluation/plan of care and documentation including the following. Please see their note for details.    HPI: Brandy [name]  is a 63 y.o.  Telehealth visit for   Chief Complaint   Patient presents with   . Follow-up      3 mo follow up.   history of colon ca, just saw [facility] and was cleared. Had mucoid bloody stool 1 mo ago, resolved.- pt will call [facility] if this recurs.   Chronic pain- decreasing tramadol, using more ibu.   Dental pain- has extraction scheduled.   Due for mammo- ordered.   fam hx of colon ca and ovarian cancer, pt w colon ca- review records to see if she has been evaled for Lynch syndrome, or if she needs screening for ovarian ca.     EXAM:  See resident exam (limited by telehealth visit)

## 2019-01-23 ENCOUNTER — Inpatient Hospital Stay: Admit: 2019-01-23 | Discharge: 2019-02-14 | Attending: MD

## 2019-01-23 ENCOUNTER — Inpatient Hospital Stay: Admit: 2019-01-23 | Attending: MD

## 2019-01-23 DIAGNOSIS — Z1231 Encounter for screening mammogram for malignant neoplasm of breast: Secondary | ICD-10-CM

## 2019-01-23 DIAGNOSIS — Z1239 Encounter for other screening for malignant neoplasm of breast: Secondary | ICD-10-CM

## 2019-01-24 NOTE — Telephone Encounter (Addendum)
-----   Message from Deirdre Priest, MD sent at 01/23/2019 10:08 PM PDT -----  9 mm nodule in right inferior breast looks like a small lymph node. Thankfully, it is unchanged from the exam in 2018. Overall, a reassuring study and she can continue to have routine screening for breast cancer q1-2 years.    Mammogram reassuring and benign, BIRADS 1. Normal breast cancer screening q1-2 years recommended     Pt notified of results and verbally understood and had no questions or concern,

## 2019-02-04 MED ORDER — traMADoL (ULTRAM) 50 mg tablet
50 | ORAL_TABLET | Freq: Three times a day (TID) | ORAL | 1 refills | 30.00000 days | Status: DC | PRN
Start: 2019-02-04 — End: 2019-02-13

## 2019-02-04 NOTE — Telephone Encounter (Signed)
Spoke with Patient regarding message below. Pt verbalized understanding and has no further questions at this time.

## 2019-02-04 NOTE — Telephone Encounter (Signed)
LVM for patient to call back. ?

## 2019-02-04 NOTE — Telephone Encounter (Signed)
Patient is requesting a refill of tramadol, last seen 12/18/2018, and no FYI restrictions.  No [facility] controlled substance agreement scanned or in FYI.  Last UDS - No UDS on file.  No future appointments.   Routed pended order to Dr. Samuella Cota for review.

## 2019-02-04 NOTE — Telephone Encounter (Signed)
-----   Message from Deirdre Priest, MD sent at 02/03/2019  6:51 PM PDT -----  Normal mammogram. Needs again in 1-2 years.

## 2019-02-13 MED ORDER — traMADoL (ULTRAM) 50 mg tablet
50 | ORAL_TABLET | Freq: Three times a day (TID) | ORAL | 1 refills | 30.00000 days | Status: DC | PRN
Start: 2019-02-13 — End: 2019-07-21

## 2019-02-13 NOTE — Telephone Encounter (Signed)
Re-printed the previously approved prescription for tramadol to the [place] station as it printed to NOWHERE.    Outpatient Medication Detail     Disp Refills Start End    traMADoL (ULTRAM) 50 mg tablet 30 tablet 1 02/04/2019     Sig - Route: Take 1 tablet by mouth 3 times daily as needed for Pain. - Oral    Class: Print    Notes to Pharmacy: Front desk: ?Please fax to [facility] pharmacy at [phone].    Order: [id number]    Date/Time Signed: 02/04/2019 15:28       Pharmacy    [id number] - [place], [place] - [address]   Associated Diagnoses    Chronic bilateral thoracic back pain [M54.6, G89.29]       Dental caries [K02.9]       Encounter    View Encounter      ?   Collection Information    Patient Information    Patient Name   Brandy [name], Brandy [name] Sex   Female DOB   03-12-1956 SSN   [id number]   Additional Information    Associated Reports   Priority and Order Details   Order Provider Info    ? ? Office phone Pager E-mail   Ordering User Latham, DO [phone] -- [email or web address] Provider Acquanetta Chain, DO [phone] -- --   Pended By (on 02/04/2019 1524) Rachel Moulds, LPN [phone] -- [name]   Patient-Level Documents:    Scan on 01/14/2019 1145 by Marye Round: [facility] Cardiology: [facility] 01/13/19  Scan on 07/04/2018 2831 by Marye Round: [facility] Endocrinology: [facility] 07/02/18  Electronic signature on 04/05/2018 1355 - E-signed  Scan on 04/05/2018 1350: Perceptive Content Scan  Scan on 04/04/2018 5176 by Marye Round: [facility] ENT: [facility] 03/27/18  Scan on 03/15/2018 1607 by Marye Round: [facility] Cardiology: [facility] 03/12/18  Electronic signature on 03/08/2018 1249 - 1 of 3 e-signatures recorded  Scan on 03/05/2018 1152 by Marye Round: [facility] Cardiology Echo Report: [facility] 02/27/18  Scan on 02/05/2018 1538 by Marye Round: [facility] ENT: [facility]  01/28/18  Scan on 01/15/2018 1005 by Judeth Cornfield Clagett: ODL x10/20/24  Scan on 01/03/2018 1331 by Genella Mech: [facility] Endocrinology: [facility] 12/10/17  Scan on 11/14/2017 3710 by Genella Mech: [facility] Neck/Thyroid Ultrasound Report: [facility] 10/09/17  Scan on 11/01/2017 1010 by Genella Mech: [facility] Endocrinology: [facility] 10/09/17  Scan on 08/06/2017 1020: CT CONSENT  Scan on 05/10/2017 1452 by Adine Madura Totten: [facility] Cardiology follow up: [facility] 05/09/17  Electronic signature on 04/03/2017 1256 - E-signed  Electronic signature on 04/03/2017 1255 - E-signed  Scan on 02/23/2017 1110 by Verlin Grills: [facility] Cardiology Follow-Up: [facility]: [facility] Rhonda W 02/09/17  Scan on 02/20/2017 1605 by Abbe Amsterdam: [facility] Transthoracic Echocardiogram Report: [facility] 01/30/2017  Scan on 01/02/2017 0645: anesthesia consent 12/28/16  Scan on 12/15/2016 0924: CT CONSENT  Scan on 11/29/2016 1148: ANESTHESIA CONSENT 11/28/16  Scan on 11/16/2016 1748: [facility] auth to discuss care  Scan on 09/26/2016 1559 by Deveron Furlong: [facility] Cardiology follow up: [facility] 06/23/16  Scan on 09/26/2016 1559 by Deveron Furlong: [facility] Cardiology follow up: [facility] 08/21/16  Scan on 09/26/2016 1558 by Deveron Furlong: [facility] Cardiology follow up: [facility] 04/19/16  Scan on 08/28/2016 1621: [facility] Auth to access Records of Dr. Sudie Bailey in  Epic 08/04/16  Scan on 06/27/2016 0759: Perceptive Content Scan  Scan on 05/25/2016 1245  Scan on 05/10/2016 1409: Perceptive Content Scan  Scan on 04/27/2016 1316: Perceptive Content Scan  Scan on 04/13/2016 1303: Perceptive Content Scan  Scan on 04/05/2016 1303  Scan on 03/31/2016 1654: Perceptive Content Scan  Scan on 03/30/2016 1218  Scan on 03/29/2016 1425  Scan on 03/15/2016 1656: Perceptive Content Scan  Scan on 03/04/2016 1151  Scan on 03/03/2016 0726: ROMI  Electronic signature on  03/03/2016 0713 - 1 of 3 e-signatures recorded  Electronic signature on 03/03/2016 0712 - 2 of 4 e-signatures recorded  Scan on 02/28/2016 1106: OHP  Scan on 02/28/2016 1104 by Linton Rump: signed by PT  Scan on 02/28/2016 1104: Perceptive Content Scan  Scan on 02/27/2016 1339  Scan on 02/19/2016 1758  Scan on 02/16/2016 2008  Scan on 01/31/2016 1826  Scan on 01/30/2016 1647  Scan on 01/24/2016 0831: CT CONSENT  Print Trail   Printed On Printed By Printed To Report   02/04/19 3:28 PM Acquanetta Chain, DO NO WHERE AMB Smarttext Prescription Quadrant Style   ?

## 2019-02-13 NOTE — Telephone Encounter (Signed)
Rx for Ultram dated 02/13/19 faxed to [facility] on 02/13/19. MK

## 2019-02-27 NOTE — Telephone Encounter (Signed)
Called pt to schedule 3 mo f/u with pcp on week of 10/28. LM on VM to return call.

## 2019-02-27 NOTE — Telephone Encounter (Signed)
Pt returned call and is scheduled

## 2019-03-31 ENCOUNTER — Other Ambulatory Visit: Admit: 2019-03-31

## 2019-03-31 DIAGNOSIS — C183 Malignant neoplasm of hepatic flexure: Secondary | ICD-10-CM

## 2019-03-31 LAB — CBC WITH AUTO DIFFERENTIAL
Basophils %: 1 % (ref 0–2)
Basophils, Absolute: 0 10*3/ÂµL (ref 0.0–0.1)
Eosinophils %: 1 % (ref 0–5)
Eosinophils, Absolute: 0.1 10*3/ÂµL (ref 0.0–0.4)
HCT: 37.8 % (ref 35.0–48.0)
Hemoglobin: 12.6 g/dL (ref 11.7–16.5)
Lymphocytes %: 33 % (ref 20–40)
Lymphocytes, Absolute: 1.8 10*3/ÂµL (ref 1.0–4.0)
MCH: 29.9 pg (ref 28.3–33.3)
MCHC: 33.5 g/dL (ref 32.5–36.0)
MCV: 89.2 fL (ref 81.0–100.0)
MPV: 6.9 fL (ref 6.9–10.0)
Monocytes %: 7 % (ref 2–12)
Monocytes, Absolute: 0.4 10*3/ÂµL (ref 0.2–1.0)
Neutrophils %: 59 % (ref 50–74)
Neutrophils, Absolute: 3.2 10*3/ÂµL (ref 2.0–7.4)
Platelet Count: 246 10*3/ÂµL (ref 150–405)
RBC: 4.23 10*6/ÂµL (ref 3.80–5.60)
RDW: 15.8 % (ref 11.7–16.1)
WBC: 5.5 10*3/ÂµL (ref 4.5–11.0)

## 2019-03-31 LAB — COMPREHENSIVE METABOLIC PANEL
ALT - Alanine Aminotransferase: 18 IU/L (ref 5–33)
AST - Aspartate Aminotransferase: 23 IU/L (ref 5–32)
Albumin/Globulin Ratio: 1.2 (ref 1.2–2.2)
Albumin: 4 g/dL (ref 3.5–5.0)
Alkaline Phosphatase: 101 IU/L (ref 35–105)
Anion Gap: 12.6 mmol/L (ref 9.0–18.0)
BUN / Creatinine Ratio: 18.8 (ref 12.0–20.0)
BUN: 15 mg/dL (ref 8–20)
Bilirubin Total: 0.4 mg/dL (ref 0.10–1.70)
CO2 - Carbon Dioxide: 22 mmol/L (ref 17–27)
Calcium: 9.5 mg/dL (ref 8.6–10.6)
Chloride: 103.4 mmol/L (ref 101.0–111.0)
Creatinine: 0.8 mg/dL (ref 0.60–1.30)
Glomerular Filtration Rate Estimate (Female): 79 mL/min/{1.73_m2} (ref >=60)
Glucose: 97 mg/dL (ref 74–106)
Osmolality Calculation: 276.4 mOsm/kg (ref 275.0–300.0)
Potassium: 4.21 mmol/L (ref 3.50–5.10)
Protein Total: 7.4 g/dL (ref 6.4–8.2)
Sodium: 138 mmol/L (ref 135–145)

## 2019-03-31 LAB — FERRITIN: Ferritin: 85 ng/mL (ref 13–150)

## 2019-03-31 LAB — TSH: TSH - Thyroid Stimulating Hormone: 3.63 u[IU]/mL (ref 0.34–5.60)

## 2019-04-01 ENCOUNTER — Inpatient Hospital Stay: Admit: 2019-04-01 | Discharge: 2019-05-07 | Attending: Hematology & Oncology

## 2019-04-01 DIAGNOSIS — C183 Malignant neoplasm of hepatic flexure: Secondary | ICD-10-CM

## 2019-04-01 MED ORDER — iopamidoL (ISOVUE-300) 61 % injection 100 mL
300 | Freq: Once | INTRAVENOUS | Status: AC
Start: 2019-04-01 — End: 2019-04-01
  Administered 2019-04-01: 15:00:00 300 mL via INTRAVENOUS

## 2019-04-03 ENCOUNTER — Ambulatory Visit: Admit: 2019-04-03 | Discharge: 2019-07-03

## 2019-04-03 DIAGNOSIS — Z08 Encounter for follow-up examination after completed treatment for malignant neoplasm: Secondary | ICD-10-CM

## 2019-04-03 NOTE — Progress Notes (Addendum)
[facility]  [place], [place]  Medical Oncology Progress Note    PATIENT: Brandy [name], Brandy [name]  CHART#: [id number]  ACCT#: [id number]  PATIENT DOB: Feb 02, 1956  DATE OF SERVICE:  04/03/2019    CHIEF COMPLAINT:  Colon cancer.    HISTORY OF PRESENT ILLNESS: Mrs. Brandy [name] is a 63 year old lady, we are   following for colon cancer.  Mrs. Brandy [name] underwent a right hemicolectomy   for colon cancer back in 2017.  The tumor was a T3 colon cancer N0.  The   patient was followed expectantly and 1 year later in, 01/2018, the patient  had a repeat colonoscopy by Montel Clock, MD, and it was completely   clear.  The patient did quite well until 11/2018 when she had 2 stools   with blood in them.  We are going to go ahead and check a FIT today.    In talking with the patient, she is doing well and feeling well.  The   patient, in the past, had a hiatal hernia and has had gastrointestinal   bleeding.  The patient also underwent an aortic valve replacement over in   [place], [place], in 2017.  The patient has a 1.4-cm nodule in the left   upper lobe and a CT scan done last week shows that this is unchanged.    Last year, in 08/2017, the patient had an elevated calcium.  Her serum   parathyroid hormone was elevated and the patient's thyroid scan showed   that she had a nodule.  The patient was referred over to [facility], and she   underwent a resection of her parathyroid gland back in 02/2018.    The patient [place] about 40 miles from here, [place] [address], right [place]  from [place] about a mile.  Both she and her husband are   from [employer] [place] [employer] and they retired out there.  They stay busy on   their 16 [place].  She is basically doing well,   except she continues to have low back pain.  Her bowel movements are   regular.  She has never had any further problems with blood in the stools.   It was bright red blood.  She is having no problems swallowing, no GI   problems, genitourinary  problems or skin problems.  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    REVIEW OF SYSTEMS:  13 system review was obtained. Pertinent positives and negatives per HPI.    PHYSICAL EXAMINATION:  GENERAL:  Reveals a 63 year old lady who is sunburned.  She is 5 feet   3-1/2 and overweight at 262 pounds.  VITAL SIGNS:  Her blood pressure is 128/70, her temperature is 97, her   pulse is 78.  Her O2 sats are 94%.  The patient is quite red.  HEENT:  Her hair is in a long ponytail.  NECK:  Without adenopathy.  CHEST:  Clear.  AXILLAE:  Without adenopathy.  BREASTS:  Without masses.  ABDOMEN:  Obese, soft, without organomegaly.  EXTREMITIES:  Well-developed, somewhat chunky, without lesions.    LABORATORIES, IMAGING AND ELECTRONIC RECORD:  The patient's white count is  5.5.  Her hemoglobin is 12.6, her hematocrit is 37.8.  Her platelet count   and differential are normal.  Her electrolytes are normal.  Her calcium,   BUN and creatinine are normal.  Her total protein is normal.  Her albumin   is  normal at 4.  Blood sugar is 97 mg %.  Her liver function tests are   normal.  Her ferritin is 85, normal.  Her glomerular filtration rate is 79  mL per minute.  Her TSH is normal at 3.63.    IMPRESSION:  1.  Stage II colon cancer with a T3 lesion, N0, back 3 years ago.  2.  Exogenous obesity.  3.  Hiatal hernia.  4.  Nodule in the lungs, unchanged with a CT scan that shows no   abnormalities within the abdomen.    PLAN:  Check a FIT.  The patient's ferritin and CBC are normal.  I do not   think she is bleeding. I do not know what caused her 2 stools for occult   blood to be positive 3 months ago, but this has apparently resolved.  We   will have the patient return to see [place] in 5 months for reevaluation.    Rhona Raider Tiyana Galla, MD  D: 04/03/2019 15:42  T: 04/05/2019 08:21  J/R: [id number]  Sheryn Bison / Cherlynn Polo  UX:LKGMW Benjamin Stain, MD  Robina Ade

## 2019-04-09 ENCOUNTER — Ambulatory Visit: Admit: 2019-04-17 | Discharge: 2019-07-01

## 2019-04-09 ENCOUNTER — Other Ambulatory Visit

## 2019-04-09 DIAGNOSIS — K921 Melena: Secondary | ICD-10-CM

## 2019-04-09 DIAGNOSIS — C183 Malignant neoplasm of hepatic flexure: Secondary | ICD-10-CM

## 2019-04-09 LAB — IMMUNOCHEMICAL FECAL OCCULT BLOOD: Fecal Occult Blood: NEGATIVE

## 2019-05-05 NOTE — Progress Notes (Signed)
Information transcribed from paper downtime forms. See scanned documentation.

## 2019-05-12 NOTE — Telephone Encounter (Signed)
Patient is requesting a refill of carvedilol, last seen by [facility] on 04/09/2019, and no FYI restrictions.  Routed pended order to Acquanetta Chain, DO for review.   No future appointments.     BP Readings from Last 3 Encounters:   04/09/19 (!) 144/91   05/21/18 152/86   03/20/18 92/59     Pulse Readings from Last 3 Encounters:   04/09/19 84   05/21/18 72   03/20/18 86       Last Labs:    LDL Calculated   Date Value Ref Range Status   02/08/2018 55 0 - 129 mg/dL Final     Triglyceride   Date Value Ref Range Status   02/08/2018 192.0 (H) <=150.0 mg/dL Final     Sodium   Date Value Ref Range Status   03/31/2019 138 135 - 145 mmol/L Final     Potassium   Date Value Ref Range Status   03/31/2019 4.21 3.50 - 5.10 mmol/L Final     AST - Aspartate Aminotransferase   Date Value Ref Range Status   03/31/2019 23 5 - 32 IU/L Final     ALT - Alanine Aminotransferase   Date Value Ref Range Status   03/31/2019 18 5 - 33 IU/L Final     BUN   Date Value Ref Range Status   03/31/2019 15 8 - 20 mg/dL Final     No results found for: HGBA1C, MICROALBUR, MALB24HUR  Glomerular Filtration Rate Estimate   Date Value Ref Range Status   03/06/2016 >60.0 >=60.0 mL/min/1.98m*2 Final     Glomerular Filtration Rate Estimate (Female)   Date Value Ref Range Status   03/31/2019 79 >=60 mL/min/1.12m*2 Final   10/04/2018 >60.0 >=60.0 mL/min/1.44m*2 Final

## 2019-05-13 NOTE — Addendum Note (Signed)
Addended by: August Luz on: 05/13/2019 04:43 PM     Modules accepted: Orders

## 2019-05-13 NOTE — Telephone Encounter (Signed)
E-scribed the previously approved prescription for Carvedilol to the pharmacy as they did not receive it; transmission to pharmacy failed.     carvediloL (COREG) 6.25 MG tablet 180 tablet 1 05/12/2019     Sig - Route: Take 1 tablet by mouth 2 times daily with meals. - Oral    Sent to pharmacy as: carvediloL 6.25 mg tablet (COREG)    Class: Normal    Order: [id number]    Date/Time Signed: 05/12/2019 15:41       E-Prescribing Status: Transmission to pharmacy failed (05/12/2019 ?3:41 PM PST)    Pharmacy    [id number] - [place], [place] - [address]

## 2019-05-14 MED ORDER — carvediloL (COREG) 6.25 MG tablet
6.25 | ORAL_TABLET | Freq: Two times a day (BID) | ORAL | 1 refills | 90.00000 days | Status: DC
Start: 2019-05-14 — End: 2019-10-14

## 2019-05-22 NOTE — Progress Notes (Signed)
Medications Administered In Clinic     flu vaccine qs (6 mos+)(PF) 2020-2021 (FLUARIX) syringe 0.5 mL     Admin Date  04/09/2019  14:25 Action  Given During Downtime Dose  0.5 mL Route  Intramuscular Site  Right Deltoid Administered By  Kemper Durie, CMA Ordering Provider  Acquanetta Chain, DO    NDC: ,     Lot#: , [id number]    Manufacturer: ,     Patient Supplied?: No    Comments: Caryl Bis MA                  Vaccine/Medication given by Caryl Bis, MA    Abstracted by Kemper Durie 05/22/19 4:38 PM     Information transcribed from paper downtime forms. See scanned documentation.

## 2019-05-23 MED ORDER — flu vaccine qs (6 mos+)(PF) 2020-2021 (FLUARIX) syringe 0.5 mL
60 | INTRAMUSCULAR | Status: AC
Start: 2019-05-23 — End: 2019-04-09
  Administered 2019-04-09: 21:00:00 60 mL via INTRAMUSCULAR

## 2019-06-18 NOTE — Progress Notes (Signed)
S: Recent bloody stools, sees Dr. Michiel Sites for history of colon cancer.  Has stool sample she is dropping off today.  No bloody stools for the past month.  Occurred 3-4 times and stopped.  Teeth pulled to get dentures.  Cannot get them until March.  Wants flu shot, wants Covid vaccine when available.  Last mammogram with mass by ultrasound benign.    A/P:  History of colon cancer: Dropping of labs today.  Follow-up with Dr. Michiel Sites.  Flu shot  Back pain, thoracic: Refill tramadol

## 2019-07-21 NOTE — Telephone Encounter (Signed)
Patient is requesting a refill of tramadol, last seen on 04/09/2019, and no FYI restrictions.  No [facility] controlled substance agreement scanned or in FYI.  Last UDS - No UDS on file.  Routed pended order to Dr. Samuella Cota for review.    Future Appointments   Date Time Provider Department Center   09/03/2019  2:30 PM Aletta Edouard, MD [facility]

## 2019-07-22 MED ORDER — traMADoL (ULTRAM) 50 mg tablet
50 | ORAL_TABLET | Freq: Three times a day (TID) | ORAL | 2 refills | 30.00000 days | Status: DC | PRN
Start: 2019-07-22 — End: 2020-07-27

## 2019-09-03 ENCOUNTER — Ambulatory Visit: Admit: 2019-09-03 | Discharge: 2019-09-04 | Attending: Hematology & Oncology

## 2019-09-03 ENCOUNTER — Other Ambulatory Visit: Admit: 2019-09-03

## 2019-09-03 DIAGNOSIS — C183 Malignant neoplasm of hepatic flexure: Secondary | ICD-10-CM

## 2019-09-03 LAB — CBC WITH AUTO DIFFERENTIAL
Basophils %: 1 % (ref 0–2)
Basophils, Absolute: 0.1 10*3/ÂµL (ref 0.0–0.1)
Eosinophils %: 2 % (ref 0–5)
Eosinophils, Absolute: 0.1 10*3/ÂµL (ref 0.0–0.4)
HCT: 40.4 % (ref 35.0–48.0)
Hemoglobin: 13.4 g/dL (ref 11.7–16.5)
Lymphocytes %: 33 % (ref 20–40)
Lymphocytes, Absolute: 1.8 10*3/ÂµL (ref 1.0–4.0)
MCH: 30.4 pg (ref 28.3–33.3)
MCHC: 33.3 g/dL (ref 32.5–36.0)
MCV: 91.3 fL (ref 81.0–100.0)
MPV: 6.7 fL — ABNORMAL LOW (ref 6.9–10.0)
Monocytes %: 7 % (ref 2–12)
Monocytes, Absolute: 0.4 10*3/ÂµL (ref 0.2–1.0)
Neutrophils %: 57 % (ref 50–74)
Neutrophils, Absolute: 3.1 10*3/ÂµL (ref 2.0–7.4)
Platelet Count: 251 10*3/ÂµL (ref 150–405)
RBC: 4.42 10*6/ÂµL (ref 3.80–5.60)
RDW: 15.5 % (ref 11.7–16.1)
WBC: 5.4 10*3/ÂµL (ref 4.5–11.0)

## 2019-09-03 LAB — COMPREHENSIVE METABOLIC PANEL
ALT - Alanine Aminotransferase: 16 IU/L (ref 5–33)
AST - Aspartate Aminotransferase: 23 IU/L (ref 5–32)
Albumin/Globulin Ratio: 1.1 — ABNORMAL LOW (ref 1.2–2.2)
Albumin: 4.2 g/dL (ref 3.5–5.0)
Alkaline Phosphatase: 97 IU/L (ref 35–105)
Anion Gap: 10.2 mmol/L (ref 9.0–18.0)
BUN / Creatinine Ratio: 23.5 — ABNORMAL HIGH (ref 12.0–20.0)
BUN: 19 mg/dL (ref 8–20)
Bilirubin Total: 0.6 mg/dL (ref 0.10–1.70)
CO2 - Carbon Dioxide: 27 mmol/L (ref 17–27)
Calcium: 9.6 mg/dL (ref 8.6–10.6)
Chloride: 103.8 mmol/L (ref 101.0–111.0)
Creatinine: 0.81 mg/dL (ref 0.60–1.30)
Glomerular Filtration Rate Estimate (Female): 77 mL/min/{1.73_m2} (ref >=60)
Glucose: 89 mg/dL (ref 74–106)
Osmolality Calculation: 283 mOsm/kg (ref 275.0–300.0)
Potassium: 4.24 mmol/L (ref 3.50–5.10)
Protein Total: 8.1 g/dL (ref 6.4–8.2)
Sodium: 141 mmol/L (ref 135–145)

## 2019-09-03 NOTE — Progress Notes (Signed)
Pt is feeling good, no new symptoms.

## 2019-09-03 NOTE — Progress Notes (Signed)
[facility]  [facility], [place]  Medical Oncology Progress Note    PATIENT: Brandy [name], Brandy [name]  CHART#: [id number]  ACCT#: [id number]  PATIENT DOB: 1956-04-08  DATE OF SERVICE:  09/03/2019.    CHIEF COMPLAINT:  Colon cancer.    HISTORY OF PRESENT ILLNESS:  Mrs. Karow is a 64 year old lady who   developed hepatic flexure colon cancer back in 11/2016.  The lesion was a   T3 N0 M0 cancer.  The patient was stage II and thus was not a candidate   for adjuvant therapy.  The patient has been followed expectantly since   that time and it will be 3 years this summer since her colon cancer.  The   patient continues to have ongoing problems with low back pain.  The   patient has a past history of a hiatal hernia, but she has never had any   heartburn.  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  The patient had a parathyroid nodule   removed in 2019 because of hypercalcemia.  The patient has been doing well  and feeling well.  The patient had a CT scan of the chest and abdomen done  6 months ago.  The patient has stable mild cardiomegaly.  No significant   pericardial effusion.  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  There are   stable median sternotomy changes.  She has a 1.1-cm focus identified in   the lateral segment of the left hepatic lobe and this is not significantly  changed compared to the prior examination and that was done back in   07/2017.  The patient recently had mammograms back in 01/2019 (7 months   ago) and there were no abnormalities.  It was a BI-RADS 1.  The patient   had blood work done on last visit, which was a fecal immunochemical test   (FIT) and that was negative.    REVIEW OF SYSTEMS:  A 13-point review of systems is completely negative   except for back pain.  Patient is having no aches or pains, heartburn,   difficulty swallowing, chest pain, shortness of breath, cough or GI   complaints.  She has had no further blood in her stool and no  change in   her bowel habits.    PHYSICAL EXAMINATION:  GENERAL:  Reveals a 64 year old lady who is 5 feet 3 inches and who   weighs, unfortunately, 264 pounds.  VITAL SIGNS:  Her O2 sats are 94%.  Her pulse is 80.  Her temperature is   97, her blood pressure is 122/80.  NECK:  Supple, without adenopathy.  CHEST:  Clear.  BREASTS:  Large, symmetrical, without masses or nodules.  AXILLAE:  Without adenopathy.  ABDOMEN:  Obese, soft, without organomegaly.  EXTREMITIES:  Quite chunky without edema.    LABORATORIES, IMAGING AND ELECTRONIC RECORD:   Her comprehensive metabolic  profile today shows her electrolytes are normal.  Her BUN is 19, her   creatinine is 0.8.  Her liver function tests are normal.  Her glomerular   filtration rate is 77 mL per minute.  Her CBC shows a white count of 5.4,   hemoglobin of 13.4, hematocrit of 40%, a platelet count of 251,000.    Differential shows 57 neutrophils and the platelet count again is normal.   Ferritin when checked 6 months ago was 85 ng/mL.    IMPRESSION:  1.  Parathyroid  adenoma with hypercalcemia, cured with surgery.  2.  Colon cancer, T3 N0 M0, resected in 12/2016, no evidence of disease.  3.  Ongoing orthopedic back pain.    PLAN:  We will repeat the patient's CT scan of the abdomen and pelvis in 6  months and she will return to see [place] with a CBC and CMP.    Rhona Raider Thailan Sava, MD  D: 09/03/2019 16:44  T: 09/04/2019 08:12  J/R: [id number]  SWEMA / Cherlynn Polo

## 2019-09-04 NOTE — Other (Signed)
Labs Reviewed

## 2019-10-10 IMAGING — MR MRI LUMBAR SPINE WITHOUT CONTRAST
5 of 8 series · 18 of 48 positions shown · IV contrast (gadolinium)
Comparison: None

******** ADDENDUM #1 ********/n 
There is mild degenerative reactive edema along the posterior inferior L3 
endplate consistent with Modic type I change. 
ORIGINAL REPORT ******** 
MRI LUMBAR SPINE WITHOUT CONTRAST, 10/10/2019 [DATE]: 
CLINICAL INDICATION: Lumbar radiculopathy, leg pain
TECHNIQUE: Sagittal T1, Sagittal T2, Sagittal STIR, Axial T1 and Axial T2 MR 
images of the lumbar spine were performed without intravenous gadolinium 
enhancement.

[Series 101: survey · axial · 10.0mm · 1.39mm/px · z∈[-15,+199]mm · 2 of 9 slices shown]
[im 1/9]
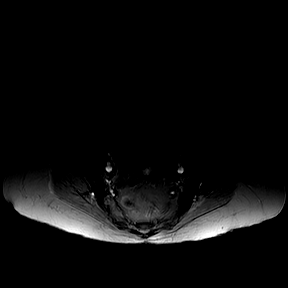
[im 9/9]
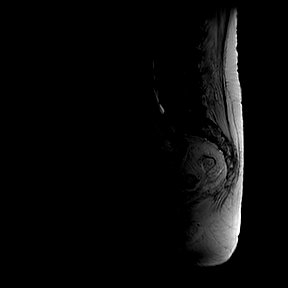

[Series 201: t2w_cor-surv · coronal · 6.0mm · 0.50mm/px · 1 of 5 slices shown]
[im 1/5]
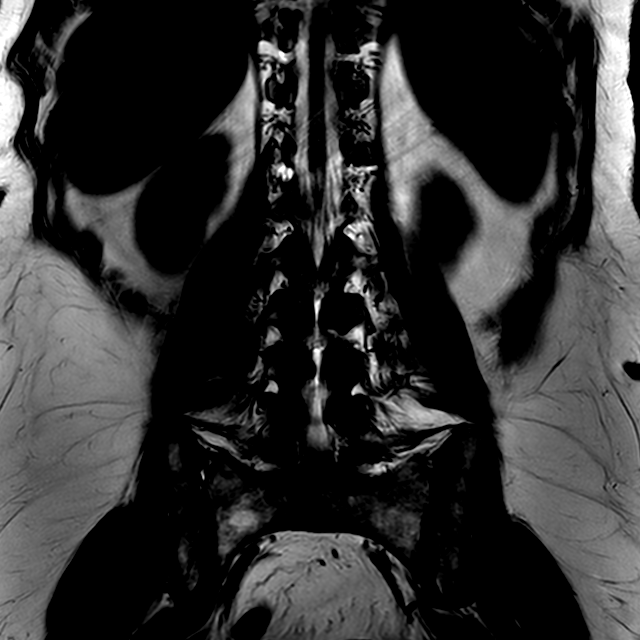

[Series 301: t1w_tse sag · sagittal · 4.0mm · 0.54mm/px · 5 of 17 slices shown]
[im 1/17]
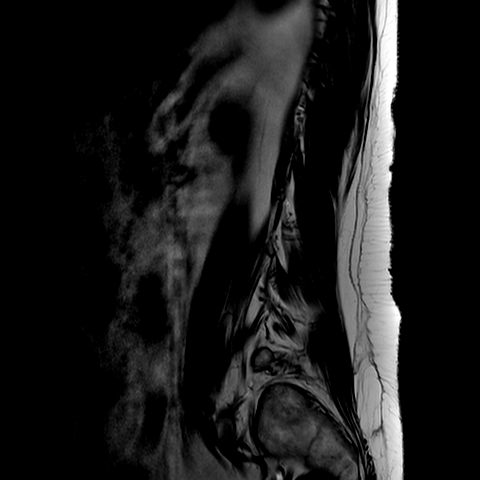
[im 5/17]
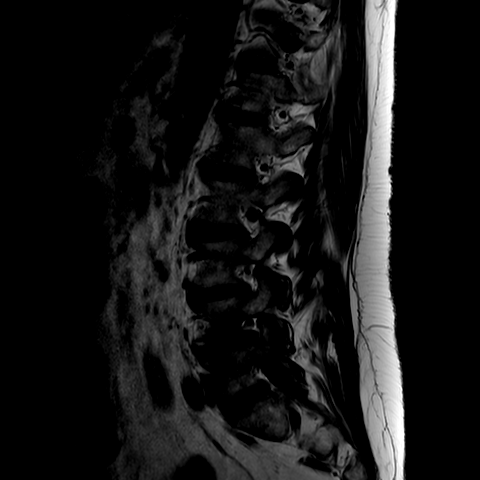
[im 9/17]
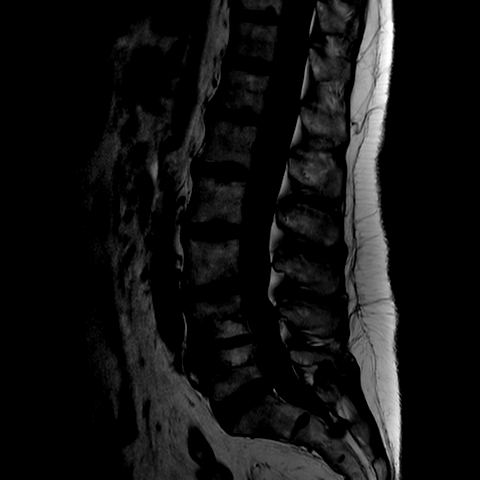
[im 13/17]
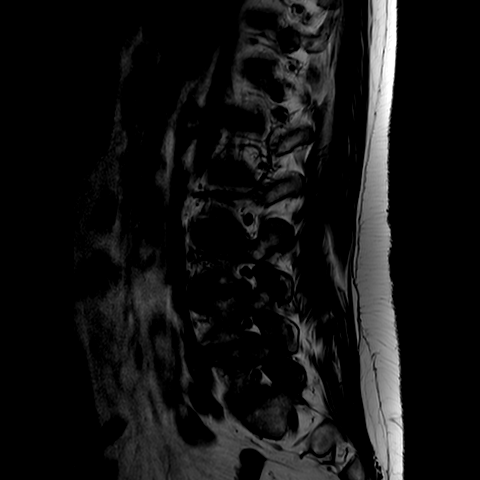
[im 17/17]
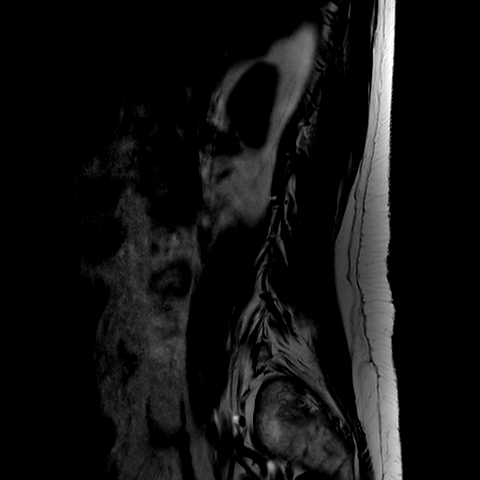

[Series 401: t2w_tse sag · sagittal · 4.0mm · 0.39mm/px · 2 of 17 slices shown]
[im 1/17]
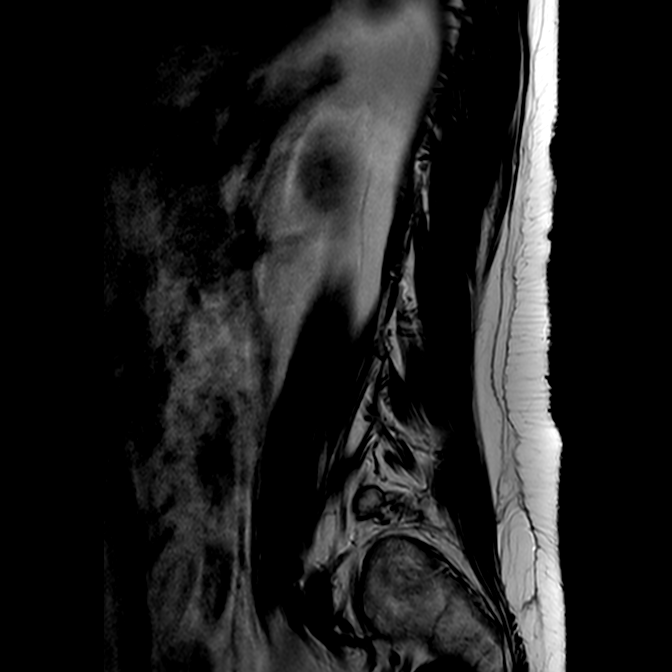
[im 5/17]
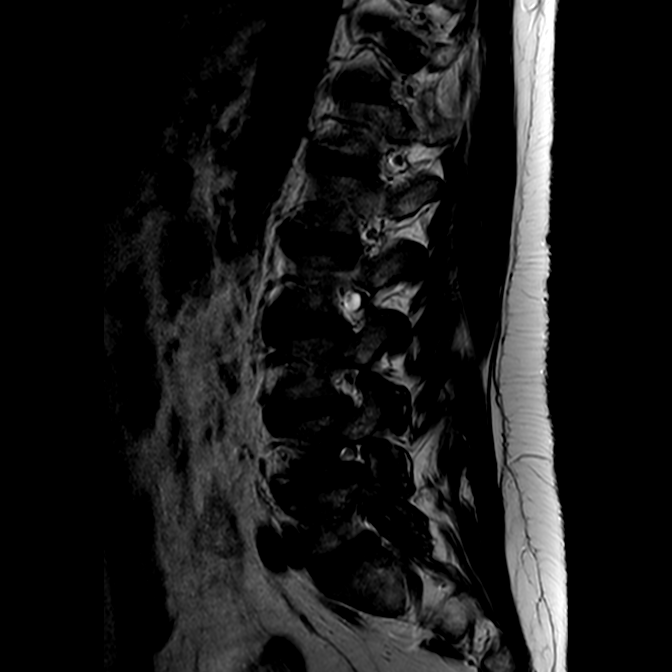

[Series 701: T1 · axial · 4.0mm · 0.35mm/px · z∈[-13,+181]mm · 8 of 35 slices shown]
[im 1/35]
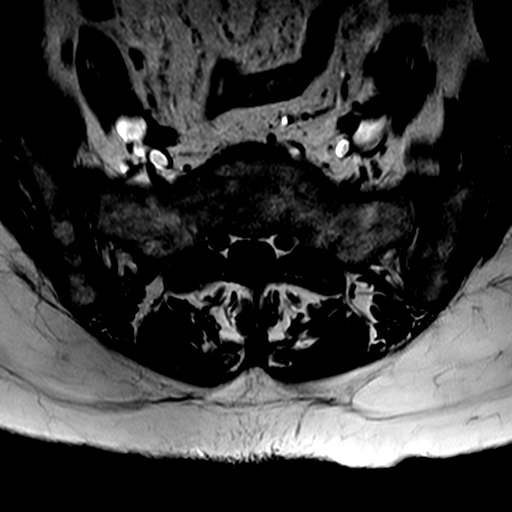
[im 4/35]
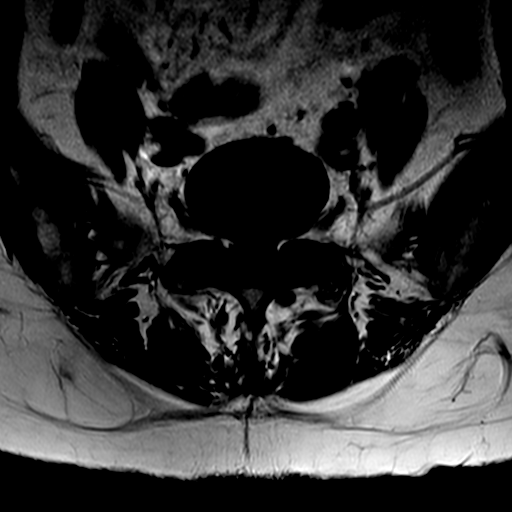
[im 12/35]
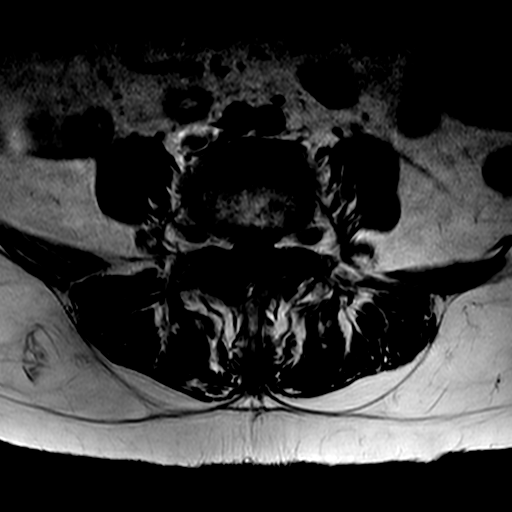
[im 16/35]
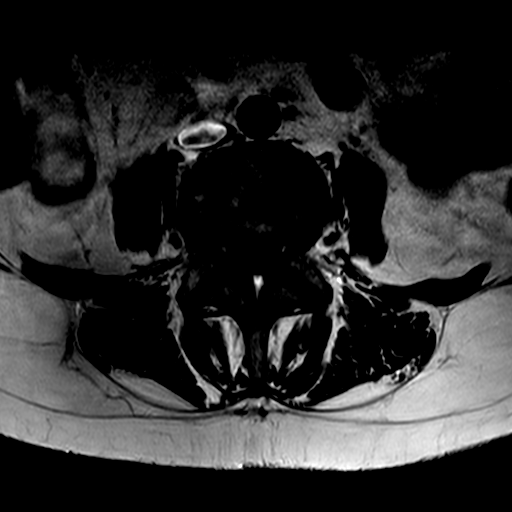
[im 19/35]
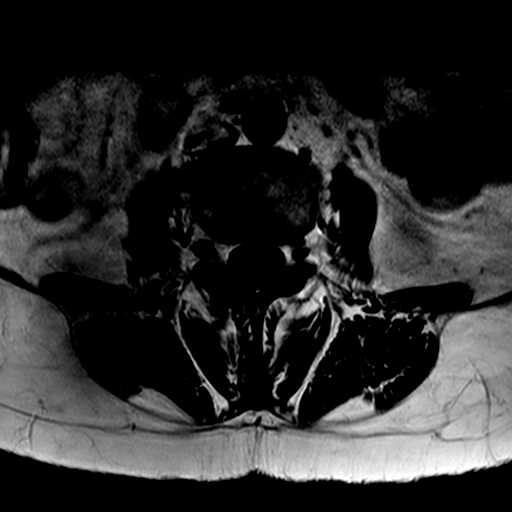
[im 23/35]
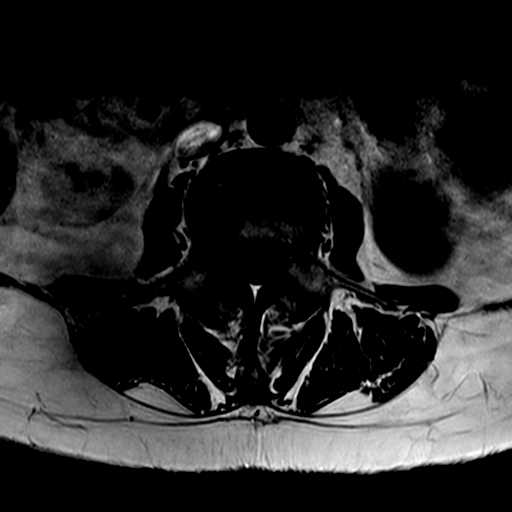
[im 31/35]
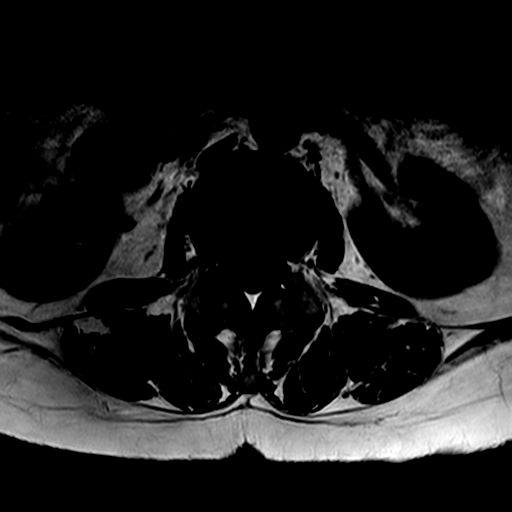
[im 35/35]
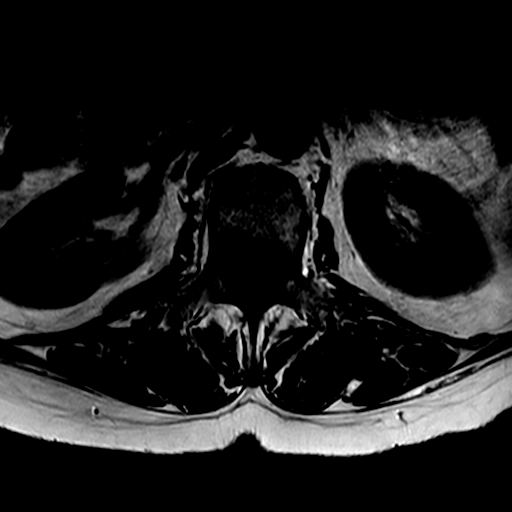

[18 of 48 positions shown; findings below may reference images not displayed]

FINDINGS: Lumbar vertebral heights are intact there is marked disc narrowing at 
L4-5 with chronic endplate marrow change. There is no evidence for fracture or 
spinal malignancy. The conus is normal. There are sacral Tarlov cyst, of 
unlikely significance. 
At L5-S1 the canal is open. There are moderate facet changes. Foramina are open. 
At L4-5 there is moderate canal stenosis due to broad-based disc bulge, minimal 
endplate osteophyte and moderate facet change with ligament is thickening, 
encroaching on the L5 nerve roots bilaterally, best seen on axial T2 image 8. 
There is mild bilateral foraminal stenosis. 
At L3-4 there is mild canal stenosis, with disc bulge and ligamentous 
thickening. There is mild right foraminal stenosis. 
At L2-3 there is mild left posterolateral disc bulge without significant canal 
or foraminal stenosis. 
At L1-2 the canal and foramina are open.
IMPRESSION: Moderate canal stenosis at L4-5. 
Mild canal stenosis at L3-4. 
No evidence for fracture or malignancy. No scoliosis. 
Borderline splenomegaly, vertical span 13 cm. The liver does not appear 
enlarged. No evident retroperitoneal adenopathy. Similar findings were seen on 
the November 23, 2016 abdominal CT. Correlation advised.

## 2019-10-14 NOTE — Telephone Encounter (Signed)
Patient is requesting a refill of Atorvastatin and Carvedilol, last seen by [facility] on  04/09/2019, and no FYI restrictions.  Routed pended order to Acquanetta Chain, DO for review.     Future Appointments   Date Time Provider Department Center   03/08/2020  4:00 PM Augusto Gamble, FNP [facility]        BP Readings from Last 3 Encounters:   09/03/19 122/79   04/09/19 (!) 144/91   05/21/18 152/86     Pulse Readings from Last 3 Encounters:   09/03/19 80   04/09/19 84   05/21/18 72       Last Labs:    LDL Calculated   Date Value Ref Range Status   02/08/2018 55 0 - 129 mg/dL Final     Triglyceride   Date Value Ref Range Status   02/08/2018 192.0 (H) <=150.0 mg/dL Final     Sodium   Date Value Ref Range Status   09/03/2019 141 135 - 145 mmol/L Final     Potassium   Date Value Ref Range Status   09/03/2019 4.24 3.50 - 5.10 mmol/L Final     AST - Aspartate Aminotransferase   Date Value Ref Range Status   09/03/2019 23 5 - 32 IU/L Final     ALT - Alanine Aminotransferase   Date Value Ref Range Status   09/03/2019 16 5 - 33 IU/L Final     BUN   Date Value Ref Range Status   09/03/2019 19 8 - 20 mg/dL Final     No results found for: HGBA1C, MICROALBUR, MALB24HUR  Glomerular Filtration Rate Estimate   Date Value Ref Range Status   03/06/2016 >60.0 >=60.0 mL/min/1.73m*2 Final     Glomerular Filtration Rate Estimate (Female)   Date Value Ref Range Status   09/03/2019 77 >=60 mL/min/1.27m*2 Final   10/04/2018 >60.0 >=60.0 mL/min/1.23m*2 Final

## 2020-02-27 ENCOUNTER — Other Ambulatory Visit: Admit: 2020-02-27 | Discharge: 2020-02-27

## 2020-02-27 DIAGNOSIS — C183 Malignant neoplasm of hepatic flexure: Secondary | ICD-10-CM

## 2020-02-27 LAB — CBC WITH AUTO DIFFERENTIAL
Basophils %: 1 % (ref 0–2)
Basophils, Absolute: 0 10*3/??L (ref 0.0–0.1)
Eosinophils %: 2 % (ref 0–5)
Eosinophils, Absolute: 0.1 10*3/??L (ref 0.0–0.4)
HCT: 38.6 % (ref 35.0–48.0)
Hemoglobin: 12.9 g/dL (ref 11.7–16.5)
Lymphocytes %: 37 % (ref 20–40)
Lymphocytes, Absolute: 2 10*3/??L (ref 1.0–4.0)
MCH: 30.3 pg (ref 28.3–33.3)
MCHC: 33.3 g/dL (ref 32.5–36.0)
MCV: 90.9 fL (ref 81.0–100.0)
MPV: 7.4 fL (ref 6.9–10.0)
Monocytes %: 7 % (ref 2–12)
Monocytes, Absolute: 0.4 10*3/??L (ref 0.2–1.0)
Neutrophils %: 54 % (ref 50–74)
Neutrophils, Absolute: 2.9 10*3/??L (ref 2.0–7.4)
Platelet Count: 238 10*3/??L (ref 150–405)
RBC: 4.25 10*6/??L (ref 3.80–5.60)
RDW: 16.1 % (ref 11.7–16.1)
WBC: 5.4 10*3/??L (ref 4.5–11.0)

## 2020-02-27 LAB — COMPREHENSIVE METABOLIC PANEL
ALT - Alanine Aminotransferase: 17 IU/L (ref 5–33)
AST - Aspartate Aminotransferase: 23 IU/L (ref 5–32)
Albumin/Globulin Ratio: 1.1 — ABNORMAL LOW (ref 1.2–2.2)
Albumin: 4.2 g/dL (ref 3.5–5.0)
Alkaline Phosphatase: 111 IU/L — ABNORMAL HIGH (ref 35–105)
Anion Gap: 11.8 mmol/L (ref 9.0–18.0)
BUN / Creatinine Ratio: 15.4 (ref 12.0–20.0)
BUN: 12 mg/dL (ref 8–20)
Bilirubin Total: 0.7 mg/dL (ref 0.10–1.70)
CO2 - Carbon Dioxide: 25 mmol/L (ref 17–27)
Calcium: 10.2 mg/dL (ref 8.6–10.6)
Chloride: 101.2 mmol/L (ref 101.0–111.0)
Creatinine: 0.78 mg/dL (ref 0.60–1.30)
Glomerular Filtration Rate Estimate (Female): 81 mL/min/{1.73_m2} (ref >=60)
Glucose: 84 mg/dL (ref 74–106)
Osmolality Calculation: 274.6 mOsm/kg — ABNORMAL LOW (ref 275.0–300.0)
Potassium: 4.05 mmol/L (ref 3.50–5.10)
Protein Total: 8.1 g/dL (ref 6.4–8.2)
Sodium: 138 mmol/L (ref 135–145)

## 2020-03-02 ENCOUNTER — Inpatient Hospital Stay: Admit: 2020-03-02 | Discharge: 2020-03-22 | Attending: Hematology & Oncology

## 2020-03-02 DIAGNOSIS — C183 Malignant neoplasm of hepatic flexure: Secondary | ICD-10-CM

## 2020-03-02 MED ORDER — iopamidoL (ISOVUE-300) 61 % injection 100 mL
300 | Freq: Once | INTRAVENOUS | Status: AC
Start: 2020-03-02 — End: 2020-03-02
  Administered 2020-03-02: 21:00:00 300 mL via INTRAVENOUS

## 2020-03-08 ENCOUNTER — Ambulatory Visit: Admit: 2020-03-08 | Discharge: 2020-03-09

## 2020-03-08 DIAGNOSIS — Z08 Encounter for follow-up examination after completed treatment for malignant neoplasm: Secondary | ICD-10-CM

## 2020-03-08 NOTE — Progress Notes (Signed)
The patient stated that she feels good but she is always tired but she said that's normal. She stated that she is  always having back pain. She said disc maybe. She said she has no other symptoms or concerns but that she was recently at the hospital with heart issues.

## 2020-03-08 NOTE — Progress Notes (Signed)
[facility]  [facility], [place]  Medical Oncology Progress Note    PATIENT: Brandy [name], Brandy [name]  CHART#: [id number]  ACCT#: [id number]  PATIENT DOB: [birth year]  DATE OF SERVICE:  03/08/2020.    DIAGNOSIS:  Colon cancer.    HISTORY OF PRESENT ILLNESS:  The patient is a pleasant 64 year old female   who returns to clinic today accompanied by her husband, Jonny Ruiz.    The patient was diagnosed with an adenocarcinoma of the colon in 11/2016.   It was a stage II (pT3 N0 M0).  The patient underwent a hemicolectomy and   has been under surveillance since that time.  The patient's most recent   imaging was performed on 03/02/2020 - CT of the abdomen and pelvis, which   shows no evidence of metastatic disease.    In speaking with the patient, she has no new symptomatology.  She states   that she has had no fevers, chills, or other infectious symptoms.  She   states her appetite is good.  She states she is always somewhat tired, but  it is normal for her and there has been no change.  She denies any chest   pain.  She has an occasional dry cough.  She feels it is related to   allergies and smoke and dust in the air.  She does have some shortness of   breath with exertion, but nothing new.  The patient also has a history of   an aortic valve replacement in 2017 and she states she has had some   shortness of breath since that time.  She is followed by cardiology.  She   denies any changes to her abdomen.  She has no abdominal pain, bloating or  pressure.  She has had no change in her GI habits, specifically no change   in bowel color, consistency or frequency.  She has had no change to GU   habits.  She denies any swelling in her feet or ankles.  She denies any   new skin rashes.  She denies any chronic headaches, dizziness, or other   focal neurological complaints.    REVIEW OF SYSTEMS:  A 13-system review was obtained, pertinent positives   and negatives per HPI.    PHYSICAL EXAMINATION:  GENERAL:  The patient is alert and  conversant, in no acute distress.  ECOG  status of 0.  VITAL SIGNS:  Blood pressure is 144/83, temp is 97.4, pulse is 78, O2 sats  are 98% on room air.  Height is 5 feet 3 inches, weight is 264.9 pounds.  HEENT:  The patient has dark hair with gray, long in a braid, normal hair   distribution.  Her eyes are PERRL, EOMs intact.  Sclerae are nonicteric.  NECK:  Supple, without adenopathy.  CHEST:  Clear to auscultation bilaterally.  CARDIAC:  She has a regular rate and rhythm.  ABDOMEN:  Soft, nontender, no organomegaly, somewhat obese.  LYMPH NODES:  No lymphadenopathy palpated in the auricular, cervical,   supraclavicular, axillary, epitrochlear or inguinal regions.    LABORATORIES, IMAGING AND ELECTRONIC RECORD:  Today, the patient's CBC   shows a white count of 5.4, hemoglobin 12.9, hematocrit 38.6, platelet   count of.  Her CMP shows sodium 138, potassium 4.05.  BUN is 12,   creatinine 0.78, GFR is 81, AST and ALT are normal.  Alk phos is elevated   at 111.    IMPRESSION:  Adenocarcinoma of the colon, stage II (pT3  N0 M0) status post  right-sided hemicolectomy, diagnosed in 12/2016, under surveillance.    PLAN:  The patient had her 1-year followup colonoscopy after surgery   performed in 01/2018, which was clear.  She will need a repeat in 3 years,  so that will be due 01/2021.  She will return to the clinic in 6 months   with a repeat CBC, CMP, and CEA.  We will consider repeat imaging at that   point or she can wait a year with no symptomatology.  The patient will   call the clinic with any issues or concerns before that time.    Augusto Gamble, NP  D: 03/08/2020 19:08  T: 03/09/2020 07:27  J/R: [id number]  SWEJE / Cherlynn Polo

## 2020-04-23 ENCOUNTER — Ambulatory Visit: Admit: 2020-04-23 | Discharge: 2020-06-16

## 2020-04-23 ENCOUNTER — Institutional Professional Consult (permissible substitution): Admit: 2020-04-23 | Discharge: 2020-04-23

## 2020-04-23 DIAGNOSIS — Z23 Encounter for immunization: Secondary | ICD-10-CM

## 2020-04-23 MED ORDER — flu vac 2021 65up-adjMF59C(PF) 2021-2022 (FLUAD QUAD) syringe 0.5 mL
60 | INTRAMUSCULAR | Status: AC
Start: 2020-04-23 — End: 2020-04-23
  Administered 2020-04-23: 22:00:00 60 mL via INTRAMUSCULAR

## 2020-04-23 NOTE — Progress Notes (Signed)
Verified with MA Nicole pt okay to receive flu shot.     Torre Pikus RN

## 2020-04-23 NOTE — Progress Notes (Signed)
Medications Administered In Clinic     flu vac 2021 [id number](PF) 2021-2022 (FLUAD QUAD) syringe 0.5 mL     Admin Date  04/23/2020  14:01 Action  Given Dose  0.5 mL Route  Intramuscular Site  Right Deltoid Administered By  Nelia Shi, MA Ordering Provider  Jon Gills, MD    NDC: [phone]    Lot#: [id number]    Manufacturer: Seqirus    Patient Supplied?: No              Screening Questionnaire for Injectable Influenza Vaccination:    Is the patient sick today?   No    Does the patients have an allergy to eggs  or to a component of the vaccine?   No    Has the patient ever had a serious reaction   to the influenza vaccine in the past?  No    Has the patient ever had Guillain-Barre Syndrome?  No    Has the VIS been offered to the patient?  Yes       Injection/Immunization Given  Given by: Me  Allergies verified with patient/caregiver and Tolerated as anticipated

## 2020-05-13 NOTE — Telephone Encounter (Signed)
Patient called in checking on status of this request. Informed Pt message is awaiting for providers review.     Patient is out of medication.

## 2020-05-13 NOTE — Telephone Encounter (Signed)
Patient is requesting a refill of atorvastatin, last seen by [facility] on 04/09/2019, and no FYI restrictions.  Patient is due for an appointment.  Routed to the front desk to contact patient and schedule an appointment.  Routed pended order to Acquanetta Chain, DO for review.     Future Appointments   Date Time Provider Department Center   09/08/2020  3:15 PM Augusto Gamble, FNP [facility]        BP Readings from Last 3 Encounters:   03/08/20 144/83   09/03/19 122/79   04/09/19 (!) 144/91     Pulse Readings from Last 3 Encounters:   03/08/20 78   09/03/19 80   04/09/19 84       Last Labs:    LDL Calculated   Date Value Ref Range Status   02/08/2018 55 0 - 129 mg/dL Final     Triglyceride   Date Value Ref Range Status   02/08/2018 192.0 (H) <=150.0 mg/dL Final     Sodium   Date Value Ref Range Status   02/27/2020 138 135 - 145 mmol/L Final     Potassium   Date Value Ref Range Status   02/27/2020 4.05 3.50 - 5.10 mmol/L Final     AST - Aspartate Aminotransferase   Date Value Ref Range Status   02/27/2020 23 5 - 32 IU/L Final     ALT - Alanine Aminotransferase   Date Value Ref Range Status   02/27/2020 17 5 - 33 IU/L Final     BUN   Date Value Ref Range Status   02/27/2020 12 8 - 20 mg/dL Final     No results found for: HGBA1C, MICROALBUR, MALB24HUR  Glomerular Filtration Rate Estimate   Date Value Ref Range Status   03/06/2016 >60.0 >=60.0 mL/min/1.31m*2 Final     Glomerular Filtration Rate Estimate (Female)   Date Value Ref Range Status   02/27/2020 81 >=60 mL/min/1.16m*2 Final   10/04/2018 >60.0 >=60.0 mL/min/1.88m*2 Final

## 2020-05-21 ENCOUNTER — Encounter

## 2020-06-23 NOTE — Telephone Encounter (Signed)
Pt called in to request a refill of: carvediloL (COREG) 6.25 MG tablet  Pharmacy: [facility] number: [phone]    Routing to the team, Lainie already gone for the day.

## 2020-06-23 NOTE — Telephone Encounter (Signed)
Routing to PCP, pt is requesting refill for COREG 6.25MG , last rx was on 10/14/2019 for 180 tablets with one refills.

## 2020-06-24 MED ORDER — carvediloL (COREG) 6.25 MG tablet
6.25 | ORAL_TABLET | Freq: Two times a day (BID) | ORAL | 1 refills | 90.00000 days | Status: DC
Start: 2020-06-24 — End: 2020-12-29

## 2020-07-27 ENCOUNTER — Other Ambulatory Visit: Admit: 2020-07-28

## 2020-07-27 ENCOUNTER — Ambulatory Visit: Admit: 2020-07-27 | Discharge: 2020-07-28

## 2020-07-27 DIAGNOSIS — R55 Syncope and collapse: Secondary | ICD-10-CM

## 2020-07-27 MED ORDER — pneumococcal conjugated vaccine (PF) (PREVNAR PCV13) vaccine 0.5 mL
0.5 | INTRAMUSCULAR | Status: AC
Start: 2020-07-27 — End: 2020-07-27
  Administered 2020-07-28: 0.5 mL via INTRAMUSCULAR

## 2020-07-27 NOTE — Progress Notes (Signed)
Chief Complaint   Patient presents with   . Medication Management     no concerns, everything working fine       Vitals:    07/27/20 1412   BP: 122/81   BP Location: Left arm   Patient Position: Sitting   Pulse: 88   Resp: 16   Temp: 36.1 ?C (97 ?F)   TempSrc: Temporal   SpO2: 96%   Weight: 260 lb 8 oz (118.2 kg)   Height: 5' 3 (1.6 m)     Medications Administered In Clinic     pneumococcal conjugated vaccine (PF) (PREVNAR PCV13) vaccine 0.5 mL     Admin Date  07/27/2020  16:08 Action  Given Dose  0.5 mL Route  Intramuscular Site  Left Deltoid Administered By  Wille Glaser, MA Ordering Provider  Acquanetta Chain, DO    NDC: [phone]    Lot#: [id number]    Manufacturer: [employer]    Patient Supplied?: No              Allergies Reviewed  Allergies reviewed with patient or care provider: Yes  Injection/Immunization Given  Given by: Me  Allergies verified with patient/caregiver and Tolerated as anticipated        Smoking Counseling given: Yes  Comment: SBIRT 02/13/2018 [facility] Father smoked in the home.       Screening:   Depression:  PHQ-2: 0 (07/27/2020)  Substance Use: EtOH - EtOH SBIRT Negative (07/27/2020)  Drug - Drug SBIRT Negative (07/27/2020)  Cognitive Assessment:    Vision Screen:      Fall Screen:            No exam data present        Health Maintenance Due   Topic Date Due   . HIV Screening  Never done   . COVID-19 Vaccine (1) 03/31/1961   . Diabetes Screening  01/26/2019   . Pneumococcal Vaccine: 65+ Years (1 of 1 - PPSV23) Never done       Food Insecurity: Not on file

## 2020-07-27 NOTE — Progress Notes (Signed)
I have discussed patient with the resident. In addition to this summary note, I agree with Dr. Kandice Hams separate note from 07/27/2020.    Summary History:  Brandy [name] is 65 y.o. year old patient with significant past medical history of aortic valve replacement, history of demand ischemia presenting for presyncopal episode.    HPI-Pertinent  Presyncopal symptoms    Improves with sitting down. Occurs once a month.     Seen recently by cardiology, unable to complete full stress test.     Due for pneumo    Needs follow up for hyperparathyroid, history of partial removal, on supplementation.     Active Ambulatory Problems     Diagnosis Date Noted   . Demand ischemia (HCC) 01/21/2016   . Pleural effusion 01/21/2016   . Obesity 01/21/2016   . Iron deficiency anemia 01/21/2016   . Hilar lymphadenopathy- bilataeral  01/21/2016   . Liver lesion- small hypodense lesion around falciform lig 01/21/2016   . Non-ischemic cardiomyopathy (HCC) 01/23/2016   . 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 Aortic valve disorder 02/17/2016   . 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 Colon cancer (HCC) 12/28/2016   . Hyperparathyroidism (HCC) 02/12/2018   . Back pain, thoracic 02/13/2018   . Anaphylaxis 09/20/2018     Resolved Ambulatory Problems     Diagnosis Date Noted   . Elevated brain natriuretic peptide (BNP) level 01/21/2016   . Tachycardia 01/21/2016   . Acute respiratory failure with hypoxia (HCC) 01/21/2016   . Hypotension 01/21/2016   . Mediastinal lymphadenopathy 01/21/2016   . Chest pain 01/21/2016   . Dyspnea and respiratory abnormality 01/21/2016   . Elevated troponin 01/21/2016   . Abnormal EKG 01/21/2016   . Opacity of lung on imaging study 01/21/2016   . Acute systolic heart failure (HCC) 01/22/2016   . Critical aortic valve stenosis 01/23/2016   . Respiratory failure (HCC) 01/23/2016   . Acute renal failure with tubular necrosis (HCC) 02/13/2016   . Impaired gait and mobility 02/17/2016   .  Impaired activities of daily living 02/17/2016   . Peripheral edema 02/17/2016   . Blood loss anemia 02/17/2016   . Hypokalemia 02/17/2016   . Gangrene of foot (HCC) 03/08/2016   . Screening for colon cancer 10/04/2016   . Colonic mass 11/28/2016   . Encounter for screening colonoscopy 12/05/2017     Past Medical History:   Diagnosis Date   . Arrhythmia    . CHF (congestive heart failure) (HCC)    . History of blood transfusion    . HIT (heparin-induced thrombocytopenia) (HCC)    . Hyperlipidemia    . Thyroid disease         Current Outpatient Medications on File Prior to Visit   Medication Sig Dispense Refill   . aspirin 81 MG EC tablet Take 81 mg by mouth daily.     Marland Kitchen atorvastatin (LIPITOR) 20 MG tablet Take 1 tablet by mouth every night at bedtime. 90 tablet 0   . calcium-vitamin D 500 mg-200 unit per tablet Take 2 tablets by mouth every 8 hours. After 2 days, decrease to 1 tablet every 8 hours. 200 tablet 1   . carvediloL (COREG) 6.25 MG tablet Take 1 tablet by mouth 2 times daily with meals. 180 tablet 1   . cholecalciferol, vitamin D3, (VITAMIN D3) 50 mcg (2,000 unit) Tab Take 2,000 Units by mouth daily.      Marland Kitchen ibuprofen 200  mg Cap Take by mouth as needed.     Marland Kitchen EPINEPHrine (EPIPEN) 0.3 mg/0.3 mL AtIn injection Inject 0.3 mL into the muscle once for 1 dose. 0.3 mL 0   . traMADoL (ULTRAM) 50 mg tablet Take 1 tablet by mouth 3 times daily as needed for Pain. (Patient not taking: Reported on 03/08/2020) 30 tablet 2     No current facility-administered medications on file prior to visit.        Relevant Subjective:  Complete ROS negative except for HPI    Relevant Objective:  Vitals:    07/27/20 1412   BP: 122/81  Comment: large   BP Location: Left arm   Patient Position: Sitting   Pulse: 88   Resp: 16   Temp: 36.1 ?C (97 ?F)   TempSrc: Temporal   SpO2: 96%  Comment: ra   Weight: 260 lb 8 oz (118.2 kg)   Height: 5' 3 (1.6 m)      Relevant physical exam  CTAB   No lower extremity edema      Assessment:  Brandy [name] is 65 y.o. year old patient with significant past medical history of aortic valve replacement, history of demand ischemia presenting for presyncopal episode.      Plan:  1. Pre-syncope  - Glyco-Hemoglobin A1C -Routine; Future  - CBC with Auto Differential -Routine; Future  - Needs long term cardiac monitoring   - Follow up referral to where patient was being referred  - Follow up with cardiology   - Follow up in 3 months after cardiology   - Basic labs  - Given history of prosthetic valve and no anticoagulation, patient at risk of TIA/CVA. Consider TIA/CVA work up.     2. Non-ischemic cardiomyopathy (HCC)  - Glyco-Hemoglobin A1C -Routine; Future  - CBC with Auto Differential -Routine; Future    3. Hyperparathyroidism (HCC)  - PTH Hormone Intact -Routine; Future  - Phosphorus -Routine; Future  - Comprehensive Metabolic Panel -Routine; Future  - 25-Hydroxyvitamin D, Immunoassay -Routine; Future    4. Screening for HIV without presence of risk factors  - HIV 1,2 AB/AG ([facility]) -Routine; Future    5. Need for pneumococcal vaccination  - pneumococcal conjugated vaccine (PF) (PREVNAR PCV13) vaccine 0.5 mL    6. Screening for diabetes mellitus (DM)  - Glyco-Hemoglobin A1C -Routine; Future

## 2020-07-27 NOTE — Progress Notes (Signed)
Reviewed [facility] and patient's chart with Hailee, MA.      Per clinic vaccine schedule, patient is eligible for Pneumococcal 23 at this time.     Pt is also due for Tdap and should be reminded to get it done at her local pharmacy.    Insurance noted on chart in EPIC at the time of review is  Medicare,   therefore vaccine should be pulled from private stock.    Florentina Addison, RN

## 2020-07-27 NOTE — Progress Notes (Signed)
History of Present Illness  Brandy [name] is a 65 y.o. 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     Few episodes of lightheadedness associated with nausea and facial tingling. No falls or LOC. Has happened a few times 4 times total over the past year. Follows with Dr. Crissie Figures and recently had stress ECHO which had to be stopped due to shortness of breath but ECHO was otherwise at baseline. Also had cartoid artery study without significant stenosis. She states she had a referral to a doctor in [place] from her cardiologist but doesn't remember what specialty that was for.     UTD on COVID vaccines. J&J and moderna booster.     Reviewed Problem List, Past Medical History, Surgical History, Family History, Social history and Allergies.       Review of Systems:  Review of Systems as documented in HPI.  Outpatient Medications Marked as Taking for the 07/27/20 encounter (Office Visit) with Acquanetta Chain, DO   Medication Sig Dispense Refill   . aspirin 81 MG EC tablet Take 81 mg by mouth daily.     Marland Kitchen atorvastatin (LIPITOR) 20 MG tablet Take 1 tablet by mouth every night at bedtime. 90 tablet 0   . calcium-vitamin D 500 mg-200 unit per tablet Take 2 tablets by mouth every 8 hours. After 2 days, decrease to 1 tablet every 8 hours. 200 tablet 1   . carvediloL (COREG) 6.25 MG tablet Take 1 tablet by mouth 2 times daily with meals. 180 tablet 1   . cholecalciferol, vitamin D3, (VITAMIN D3) 50 mcg (2,000 unit) Tab Take 2,000 Units by mouth daily.      Marland Kitchen ibuprofen 200 mg Cap Take by mouth as needed.       Current Facility-Administered Medications for the 07/27/20 encounter (Office Visit) with Acquanetta Chain, DO   Medication Dose Route Frequency Provider Last Rate Last Admin   .  [COMPLETED] pneumococcal conjugated vaccine (PF) (PREVNAR PCV13) vaccine 0.5 mL  0.5 mL Intramuscular Vaccine Acquanetta Chain, DO   0.5 mL at 07/27/20 1608       Physical Exam:  Vitals:    07/27/20 1412   BP: 122/81   BP Location: Left arm   Patient Position: Sitting   Pulse: 88   Resp: 16   Temp: 36.1 ?C (97 ?F)   TempSrc: Temporal   SpO2: 96%   Weight: 260 lb 8 oz (118.2 kg)   Height: 5' 3 (1.6 m)   Body mass index is 46.15 kg/m?Marland Kitchen   Physical Exam  Constitutional:       General: She is not in acute distress.     Appearance: Normal appearance. She is not ill-appearing.   Cardiovascular:      Rate and Rhythm: Normal rate and regular rhythm.      Heart sounds: Murmur (Systolic) heard.   Pulmonary:      Breath sounds: No wheezing, rhonchi or rales.   Abdominal:      General: Bowel sounds are normal.      Palpations: Abdomen is soft.      Tenderness: There is no abdominal tenderness.   Musculoskeletal:      Right lower leg: No edema.      Left lower leg: No edema.   Skin:     Capillary Refill: Capillary refill takes  less than 2 seconds.      Findings: No bruising, erythema or rash.   Neurological:      General: No focal deficit present.          Assessment and Plan:   1. Pre-syncope  Few episodes over the past year but does not concerning given her cardiac history.  Unable to complete stress echo but no concerning findings on echo or carotid artery work-up.  Patient has some kind referral to a physician in [facility] she is unsure what this is, will send message to our referral department to figure out what has been placed.  Also concern for possible TIA/CVA because of her history of valve replacement although it is a bioprosthetic valve and she is still on aspirin therapy.  Less likely orthostatic hypotension.  Patient would benefit from loop recorder and should have follow-up with cardiology to discuss this next month.  - Glyco-Hemoglobin A1C -Routine; Future  - CBC with Auto Differential -Routine; Future    2.  Non-ischemic cardiomyopathy (HCC)  - Glyco-Hemoglobin A1C -Routine; Future  - CBC with Auto Differential -Routine; Future    3. Hyperparathyroidism [facility])  Patient has not had labs done since 2020 after her surgical resection, will check labs again today.  - PTH Hormone Intact -Routine; Future  - Phosphorus -Routine; Future  - Comprehensive Metabolic Panel -Routine; Future  - 25-Hydroxyvitamin D, Immunoassay -Routine; Future    4. Screening for HIV without presence of risk factors  - HIV 1,2 AB/AG ([facility]) -Routine; Future    5. Need for pneumococcal vaccination  - pneumococcal conjugated vaccine (PF) (PREVNAR PCV13) vaccine 0.5 mL    6. Screening for diabetes mellitus (DM)  - Glyco-Hemoglobin A1C -Routine; Future      No problem-specific Assessment & Plan notes found for this encounter.      Follow Up:  Return in about 3 months (around 10/24/2020).    THIS PATIENT WAS DISCUSSED WITH:  Dr. Manson Passey, who agrees with the plan and did not see the patient.    Acquanetta Chain, DO  PGY-3    Please note: this document was created with the assistance of voice-to-text technology. Effort has been made to minimize transcription errors. Please allow for homonyms and other similar transcription errors, such as she in place of he and vice versa.

## 2020-07-28 LAB — COMPREHENSIVE METABOLIC PANEL
ALT - Alanine Aminotransferase: 13 IU/L (ref 5–33)
AST - Aspartate Aminotransferase: 20 IU/L (ref 5–32)
Albumin/Globulin Ratio: 1.1 — ABNORMAL LOW (ref 1.2–2.2)
Albumin: 4.2 g/dL (ref 3.5–5.0)
Alkaline Phosphatase: 115 IU/L — ABNORMAL HIGH (ref 35–105)
Anion Gap: 11.1 mmol/L (ref 9.0–18.0)
BUN / Creatinine Ratio: 19.2 (ref 12.0–20.0)
BUN: 14 mg/dL (ref 8–20)
Bilirubin Total: 0.4 mg/dL (ref 0.10–1.70)
CO2 - Carbon Dioxide: 26 mmol/L (ref 17–27)
Calcium: 9.6 mg/dL (ref 8.6–10.6)
Chloride: 103.9 mmol/L (ref 101.0–111.0)
Creatinine: 0.73 mg/dL (ref 0.60–1.30)
Glomerular Filtration Rate Estimate (Female): 87 mL/min/{1.73_m2} (ref >=60)
Glucose: 102 mg/dL (ref 74–106)
Osmolality Calculation: 281.9 mOsm/kg (ref 275.0–300.0)
Potassium: 3.55 mmol/L (ref 3.50–5.10)
Protein Total: 8.2 g/dL (ref 6.4–8.2)
Sodium: 141 mmol/L (ref 135–145)

## 2020-07-28 LAB — CBC WITH AUTO DIFFERENTIAL
Basophils %: 1 % (ref 0–2)
Basophils, Absolute: 0 10*3/??L (ref 0.0–0.1)
Eosinophils %: 2 % (ref 0–5)
Eosinophils, Absolute: 0.1 10*3/??L (ref 0.0–0.4)
HCT: 39 % (ref 35.0–48.0)
Hemoglobin: 13 g/dL (ref 11.7–16.5)
Lymphocytes %: 35 % (ref 20–40)
Lymphocytes, Absolute: 1.8 10*3/??L (ref 1.0–4.0)
MCH: 29.9 pg (ref 28.3–33.3)
MCHC: 33.4 g/dL (ref 32.5–36.0)
MCV: 89.5 fL (ref 81.0–100.0)
MPV: 7.2 fL (ref 6.9–10.0)
Monocytes %: 5 % (ref 2–12)
Monocytes, Absolute: 0.3 10*3/??L (ref 0.2–1.0)
Neutrophils %: 57 % (ref 50–74)
Neutrophils, Absolute: 2.9 10*3/??L (ref 2.0–7.4)
Platelet Count: 243 10*3/??L (ref 150–405)
RBC: 4.35 10*6/??L (ref 3.80–5.60)
RDW: 15.4 % (ref 11.7–16.1)
WBC: 5.1 10*3/??L (ref 4.5–11.0)

## 2020-07-28 LAB — PTH, INTACT: PTH: 46.5 pg/mL (ref 12.0–88.0)

## 2020-07-28 LAB — 25-HYDROXYVITAMIN D, IMMUNOASSAY (SLM): 25-Hydroxyvitamin D, Immunoassay: 30.4 ng/mL

## 2020-07-28 LAB — HIV 1,2 AB/AG SCREEN
HIV antigen: NEGATIVE
HIV-1, 2 Ab Screen (Rapid): NEGATIVE

## 2020-07-28 LAB — PHOSPHORUS: Phosphorus: 3.4 mg/dL (ref 2.5–4.7)

## 2020-07-28 LAB — GLYCO-HEMOGLOBIN A1C: Glycohemoglobin (A1c): 5.5 % (ref <=5.6)

## 2020-07-30 NOTE — Telephone Encounter (Signed)
Pt informed of results below and verbalized understanding. Pt had no further questions at this time.

## 2020-07-30 NOTE — Telephone Encounter (Signed)
-----   Message from Acquanetta Chain, DO sent at 07/30/2020  4:56 PM PST -----  Results of this test are normal. Please notify patient of normal results.  Thank you,  Acquanetta Chain, DO

## 2020-08-16 NOTE — Telephone Encounter (Signed)
Patient is requesting a refill of Atorvastatin, last seen by [facility] on  07/27/2020, and no FYI restrictions.  Routed pended order to Acquanetta Chain, DO for review.     Future Appointments   Date Time Provider Department Center   09/08/2020  3:15 PM Augusto Gamble, FNP [facility]   10/22/2020  1:40 PM Acquanetta Chain, DO [facility]        BP Readings from Last 3 Encounters:   07/27/20 122/81   03/08/20 144/83   09/03/19 122/79     Pulse Readings from Last 3 Encounters:   07/27/20 88   03/08/20 78   09/03/19 80       Last Labs:    LDL Calculated   Date Value Ref Range Status   02/08/2018 55 0 - 129 mg/dL Final     Triglyceride   Date Value Ref Range Status   02/08/2018 192.0 (H) <=150.0 mg/dL Final     Sodium   Date Value Ref Range Status   07/27/2020 141 135 - 145 mmol/L Final     Potassium   Date Value Ref Range Status   07/27/2020 3.55 3.50 - 5.10 mmol/L Final     AST - Aspartate Aminotransferase   Date Value Ref Range Status   07/27/2020 20 5 - 32 IU/L Final     ALT - Alanine Aminotransferase   Date Value Ref Range Status   07/27/2020 13 5 - 33 IU/L Final     BUN   Date Value Ref Range Status   07/27/2020 14 8 - 20 mg/dL Final     No results found for: HGBA1C, MICROALBUR, MALB24HUR  Glomerular Filtration Rate Estimate   Date Value Ref Range Status   03/06/2016 >60.0 >=60.0 mL/min/1.5m*2 Final     Glomerular Filtration Rate Estimate (Female)   Date Value Ref Range Status   07/27/2020 87 >=60 mL/min/1.64m*2 Final   10/04/2018 >60.0 >=60.0 mL/min/1.54m*2 Final

## 2020-08-17 MED ORDER — atorvastatin (LIPITOR) 20 MG tablet
20 | ORAL_TABLET | Freq: Every evening | ORAL | 3 refills | Status: DC
Start: 2020-08-17 — End: 2021-07-26

## 2020-09-08 ENCOUNTER — Other Ambulatory Visit: Admit: 2020-09-08

## 2020-09-08 ENCOUNTER — Ambulatory Visit: Admit: 2020-09-08 | Discharge: 2020-09-08

## 2020-09-08 DIAGNOSIS — Z08 Encounter for follow-up examination after completed treatment for malignant neoplasm: Secondary | ICD-10-CM

## 2020-09-08 DIAGNOSIS — C189 Malignant neoplasm of colon, unspecified: Secondary | ICD-10-CM

## 2020-09-08 LAB — CBC WITH AUTO DIFFERENTIAL
Basophils %: 1 % (ref 0–2)
Basophils, Absolute: 0 10*3/??L (ref 0.0–0.1)
Eosinophils %: 2 % (ref 0–5)
Eosinophils, Absolute: 0.1 10*3/??L (ref 0.0–0.4)
HCT: 37.8 % (ref 35.0–48.0)
Hemoglobin: 12.4 g/dL (ref 11.7–16.5)
Lymphocytes %: 31 % (ref 20–40)
Lymphocytes, Absolute: 1.7 10*3/??L (ref 1.0–4.0)
MCH: 29.7 pg (ref 28.3–33.3)
MCHC: 32.8 g/dL (ref 32.5–36.0)
MCV: 90.5 fL (ref 81.0–100.0)
MPV: 7.3 fL (ref 6.9–10.0)
Monocytes %: 7 % (ref 2–12)
Monocytes, Absolute: 0.4 10*3/??L (ref 0.2–1.0)
Neutrophils %: 59 % (ref 50–74)
Neutrophils, Absolute: 3.1 10*3/??L (ref 2.0–7.4)
Platelet Count: 244 10*3/??L (ref 150–405)
RBC: 4.18 10*6/??L (ref 3.80–5.60)
RDW: 15.9 % (ref 11.7–16.1)
WBC: 5.3 10*3/??L (ref 4.5–11.0)

## 2020-09-08 LAB — COMPREHENSIVE METABOLIC PANEL
ALT - Alanine Aminotransferase: 15 IU/L (ref 5–33)
AST - Aspartate Aminotransferase: 21 IU/L (ref 5–32)
Albumin/Globulin Ratio: 1.1 — ABNORMAL LOW (ref 1.2–2.2)
Albumin: 4.2 g/dL (ref 3.5–5.0)
Alkaline Phosphatase: 103 IU/L (ref 35–105)
Anion Gap: 13.8 mmol/L (ref 9.0–18.0)
BUN / Creatinine Ratio: 23.7 — ABNORMAL HIGH (ref 12.0–20.0)
BUN: 18 mg/dL (ref 8–20)
Bilirubin Total: 0.4 mg/dL (ref 0.10–1.70)
CO2 - Carbon Dioxide: 21 mmol/L (ref 17–27)
Calcium: 9.9 mg/dL (ref 8.6–10.6)
Chloride: 104.2 mmol/L (ref 101.0–111.0)
Creatinine: 0.76 mg/dL (ref 0.60–1.30)
Glomerular Filtration Rate Estimate (Female): 83 mL/min/{1.73_m2} (ref >=60)
Glucose: 102 mg/dL (ref 74–106)
Osmolality Calculation: 279.6 mOsm/kg (ref 275.0–300.0)
Potassium: 3.76 mmol/L (ref 3.50–5.10)
Protein Total: 7.9 g/dL (ref 6.4–8.2)
Sodium: 139 mmol/L (ref 135–145)

## 2020-09-08 LAB — CEA: CEA: 2 ng/mL (ref 0.0–5.0)

## 2020-09-08 NOTE — Addendum Note (Signed)
Addended by: Johny Shears on: 09/08/2020 11:12 AM     Modules accepted: Orders

## 2020-09-08 NOTE — Addendum Note (Signed)
Addended by: Drucilla Schmidt on: 09/08/2020 04:25 PM     Modules accepted: Orders

## 2020-09-08 NOTE — Progress Notes (Addendum)
[facility]  [place], [place]  Medical Oncology Progress Note    PATIENT: Brandy [name], Brandy [name]  CHART#: [id number]  ACCT#: [id number]  PATIENT DOB: 10-22-1955  DATE OF SERVICE:  09/08/20    DIAGNOSIS:  History of stage 2 (pT3N0) Colon cancer, s/p resection, no adjuvant treatment.    HISTORY OF PRESENT ILLNESS:  64yo woman with history of stage 2 colon cancer.    The patient was diagnosed with an adenocarcinoma of the colon in 11/2016 and she underwent resection 12/28/16.  Path showed pT3N0 tumor:  Grade 1, no LVI, no perineural invasion, 0/29 LNs, no perforation and negative margins.    Given no high risk features, no adjuvant chemotherapy was recommended and she has been under surveillance since that time.    Of note, the patient also has a history of an aortic valve replacement in 2017 and she states she has had some   shortness of breath since that time.  She is followed by cardiology.     Interval history:  Cbc and lfts done 07/2020 were normal.  CEA was normal also at 2. CT a/p done 02/2020 was normal.  CT chest not done at that time.    She is doing well.  No changes in BMs, no blood or melena.  No abd pain.  No unintended weight loss.  She has had a mild cough in the last couple of months--happens mostly when she lies down to sleep.  No other respiratory complaints.    REVIEW OF SYSTEMS:  A 13-system review was obtained and is neg except as per interval history.    PHYSICAL EXAMINATION:  VSS  Gen:  AOx3, NAD  CV:  RRR, no m  Lungs:  Clear BL  Abd: s nt nd, no hepatomeg  Ext:  No LE edema or asymmetry    LABORATORIES, IMAGING AND ELECTRONIC RECORD:  Reviewed.    IMPRESSION:  Adenocarcinoma of the colon, stage II (pT3 N0 M0) status post  right-sided hemicolectomy 12/2016, no high risk features and therefore no adj systemic therapy, now under surveillance.    PLAN:  -she is doing well, no signs or symptoms suggestive of recurrence  -cough noted, but suspect non-malignant etiology given normal CEA and low  risk cancer now 4 years out from diagnosis  -otherwise, cont surveillance as per NCCN guidelines:  -CEA, H+P q6 mos and CT c/a/p annually (due 02/2021) until 5 years out (~12/2021)--CTs ordered  -cont colonoscopies as per GI recommendations  -f/u 6 mos, after cbc, cmp and cea and CT c/a/p

## 2020-09-08 NOTE — Progress Notes (Signed)
Pt is feeling good, no new symptoms.

## 2020-10-22 ENCOUNTER — Ambulatory Visit: Admit: 2020-10-22 | Discharge: 2020-10-22

## 2020-10-22 DIAGNOSIS — E213 Hyperparathyroidism, unspecified: Secondary | ICD-10-CM

## 2020-10-22 NOTE — Progress Notes (Signed)
Chief Complaint   Patient presents with   . Follow-up     Check up. Notes say to check up on labs. Patient has active labs that have not been collected.        Vitals:    10/22/20 1336 10/22/20 1417   BP: (!) 144/88 136/83   BP Location: Left arm Left arm   Patient Position: Sitting Sitting   Pulse: 77    Resp: 20    Temp: (!) 35.9 ?C (96.6 ?F)    TempSrc: Temporal    SpO2: 96%    Weight: 269 lb 1.6 oz (122.1 kg)        Smoking: Counseling given: Yes  Comment: SBIRT 02/13/2018 [facility] Father smoked in the home.       Annual Screening:        Annual Screening not completed at this visit .    No data recorded          No exam data present    ?            There are no preventive care reminders to display for this patient.    Ambulatory Nursing Form

## 2020-10-22 NOTE — Progress Notes (Signed)
History of Present Illness  Brandy [name] is a 65 y.o. female with PMH of bicuspid aortic valve status post bioprosthetic valve replacement, nonischemic cardiomyopathy, history of colon cancer status post resection, history of parathyroid adenoma status post resection and subsequent hyperparathyroidism who presents today for lab f/u.     Hyperparathyroidism: s/p parathyroidectomy now on chronic calcium and Vit D. Recent labs wnl.     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 Symptoms wax and wane. Tries to be active as possible.     Reviewed Problem List, Past Medical History, Surgical History, Family History, Social history and Allergies.       Review of Systems:  Review of Systems as documented in HPI.  Outpatient Medications Marked as Taking for the 10/22/20 encounter (Office Visit) with Acquanetta Chain, DO   Medication Sig Dispense Refill   . aspirin 81 MG EC tablet Take 81 mg by mouth daily.     Marland Kitchen atorvastatin (LIPITOR) 20 MG tablet Take 1 tablet by mouth every night at bedtime. 90 tablet 3   . calcium-vitamin D 500 mg-200 unit per tablet Take 2 tablets by mouth every 8 hours. After 2 days, decrease to 1 tablet every 8 hours. (Patient taking differently: Take 1 tablet by mouth daily. After 2 days, decrease to 1 tablet every 8 hours.) 200 tablet 1   . carvediloL (COREG) 6.25 MG tablet Take 1 tablet by mouth 2 times daily with meals. 180 tablet 1   . cholecalciferol, vitamin D3, (VITAMIN D3) 50 mcg (2,000 unit) Tab Take 2,000 Units by mouth daily.      Marland Kitchen ibuprofen 200 mg Cap Take by mouth as needed.         Physical Exam:  Vitals:    10/22/20 1336 10/22/20 1417   BP: (!) 144/88 136/83   BP Location: Left arm Left arm   Patient Position: Sitting Sitting   Pulse: 77    Resp: 20    Temp: (!) 35.9 ?C (96.6 ?F)    TempSrc: Temporal    SpO2: 96%    Weight: 269 lb 1.6 oz (122.1 kg)    Body mass index is 46.92 kg/m?Marland Kitchen   Physical Exam  Constitutional:       General: She is not in acute distress.      Appearance: She is obese. She is not ill-appearing.   Cardiovascular:      Rate and Rhythm: Normal rate and regular rhythm.      Heart sounds: No murmur heard.  Pulmonary:      Breath sounds: No wheezing, rhonchi or rales.   Musculoskeletal:      Right lower leg: No edema.      Left lower leg: No edema.   Skin:     Capillary Refill: Capillary refill takes less than 2 seconds.      Findings: No bruising, erythema or rash.          Assessment and Plan:   1. Hyperparathyroidism [facility])  Patient currently controlled on supplement therapy with recent labs with normal limits.  2. Chronic bilateral thoracic back pain  Patient with chronic back pain currently managed with home remedies.    No problem-specific Assessment & Plan notes found for this encounter.      Follow Up:  Return in about 6 months (around 04/24/2021).    THIS PATIENT WAS DISCUSSED WITH:  Dr. Almeta Monas, who agrees with the plan and did not see the patient.    Acquanetta Chain,  DO  PGY-3    Please note: this document was created with the assistance of voice-to-text technology. Effort has been made to minimize transcription errors. Please allow for homonyms and other similar transcription errors, such as she in place of he and vice versa.

## 2020-10-22 NOTE — Progress Notes (Signed)
[name] was discussed and reviewed with resident at the time of visit    My findings concur with Acquanetta Chain, DO's evaluation/plan of care and documentation including the following. Please see his/her note for details.    HPI: Brandy [name] is a 65 y.o. female here for follow-up.    EXAM: I agree with resident exam.    A/P:    1. Hyperparathyroidism (HCC)  - Ca normal.    2. Chronic bilateral thoracic back pain

## 2020-12-29 MED ORDER — carvediloL (COREG) 6.25 MG tablet
6.25 | ORAL_TABLET | Freq: Two times a day (BID) | ORAL | 1 refills | 90.00000 days | Status: DC
Start: 2020-12-29 — End: 2021-07-26

## 2020-12-29 NOTE — Telephone Encounter (Signed)
Patient is requesting a refill of carvediloL (COREG) 6.25 MG tablet, last seen by [facility] on  10/22/20, and no FYI restrictions.  Routed pended order to Bretta Bang, DO for review.     Future Appointments   Date Time Provider Department Center   03/11/2021  2:30 PM Lindley Magnus, MD [facility]        BP Readings from Last 3 Encounters:   10/22/20 136/83   09/08/20 133/71   07/27/20 122/81     Pulse Readings from Last 3 Encounters:   10/22/20 77   09/08/20 67   07/27/20 88       Last Labs:    LDL Calculated   Date Value Ref Range Status   02/08/2018 55 0 - 129 mg/dL Final     Triglyceride   Date Value Ref Range Status   02/08/2018 192.0 (H) <=150.0 mg/dL Final     Sodium   Date Value Ref Range Status   09/08/2020 139 135 - 145 mmol/L Final     Potassium   Date Value Ref Range Status   09/08/2020 3.76 3.50 - 5.10 mmol/L Final     AST - Aspartate Aminotransferase   Date Value Ref Range Status   09/08/2020 21 5 - 32 IU/L Final     ALT - Alanine Aminotransferase   Date Value Ref Range Status   09/08/2020 15 5 - 33 IU/L Final     BUN   Date Value Ref Range Status   09/08/2020 18 8 - 20 mg/dL Final     No results found for: HGBA1C, MICROALBUR, MALB24HUR  Glomerular Filtration Rate Estimate   Date Value Ref Range Status   03/06/2016 >60.0 >=60.0 mL/min/1.44m*2 Final     Glomerular Filtration Rate Estimate (Female)   Date Value Ref Range Status   09/08/2020 83 >=60 mL/min/1.41m*2 Final   10/04/2018 >60.0 >=60.0 mL/min/1.31m*2 Final

## 2020-12-30 NOTE — Telephone Encounter (Signed)
Faxed Rx for Coreg dated 07/20 to [facility] on 07/21 DR

## 2020-12-31 NOTE — Telephone Encounter (Signed)
Pt called in stating pharmacy did not receive Rx. Spoke with Matty MA she will refax.

## 2020-12-31 NOTE — Telephone Encounter (Signed)
Re faxed Rx for Coreg to [facility] on 7/22.

## 2021-01-31 NOTE — Telephone Encounter (Signed)
Patient is calling in to get excused from jury duty for September.

## 2021-01-31 NOTE — Telephone Encounter (Signed)
Patient states that they are needing a letter excusing them from jury duty due to their constant back pain. States that they aren't going to be able to sit for long periods of time. States that they having been taking their ibuprofen but it hasn't been helping. So they are wanting to know if they can have a letter excusing them from jury duty. Please advise, thank you!

## 2021-02-07 NOTE — Telephone Encounter (Signed)
That would be up to you  If you think it is too much for her then write the letter

## 2021-02-28 ENCOUNTER — Other Ambulatory Visit: Admit: 2021-02-28

## 2021-02-28 DIAGNOSIS — C189 Malignant neoplasm of colon, unspecified: Secondary | ICD-10-CM

## 2021-02-28 LAB — CBC WITH AUTO DIFFERENTIAL
Basophils %: 1 % (ref 0–2)
Basophils, Absolute: 0 10*3/??L (ref 0.0–0.1)
Eosinophils %: 2 % (ref 0–5)
Eosinophils, Absolute: 0.1 10*3/??L (ref 0.0–0.4)
HCT: 38.4 % (ref 35.0–48.0)
Hemoglobin: 12.8 g/dL (ref 11.7–16.5)
Lymphocytes %: 36 % (ref 20–40)
Lymphocytes, Absolute: 1.9 10*3/??L (ref 1.0–4.0)
MCH: 30.6 pg (ref 28.3–33.3)
MCHC: 33.4 g/dL (ref 32.5–36.0)
MCV: 91.5 fL (ref 81.0–100.0)
MPV: 7.5 fL (ref 6.9–10.0)
Monocytes %: 8 % (ref 2–12)
Monocytes, Absolute: 0.4 10*3/??L (ref 0.2–1.0)
Neutrophils %: 54 % (ref 50–74)
Neutrophils, Absolute: 2.9 10*3/??L (ref 2.0–7.4)
Platelet Count: 223 10*3/??L (ref 150–405)
RBC: 4.2 10*6/??L (ref 3.80–5.60)
RDW: 15.7 % (ref 11.7–16.1)
WBC: 5.3 10*3/??L (ref 4.5–11.0)

## 2021-02-28 LAB — COMPREHENSIVE METABOLIC PANEL
ALT - Alanine Aminotransferase: 17 IU/L (ref 5–33)
AST - Aspartate Aminotransferase: 21 IU/L (ref 5–32)
Albumin/Globulin Ratio: 1.3 (ref 1.2–2.2)
Albumin: 4.3 g/dL (ref 3.5–5.0)
Alkaline Phosphatase: 106 IU/L — ABNORMAL HIGH (ref 35–105)
Anion Gap: 11.7 mmol/L (ref 9.0–18.0)
BUN / Creatinine Ratio: 17.7 (ref 12.0–20.0)
BUN: 14 mg/dL (ref 8–20)
Bilirubin Total: 0.5 mg/dL (ref 0.10–1.70)
CO2 - Carbon Dioxide: 25 mmol/L (ref 17–27)
Calcium: 9.6 mg/dL (ref 8.6–10.6)
Chloride: 102.3 mmol/L (ref 101.0–111.0)
Creatinine: 0.79 mg/dL (ref 0.60–1.30)
Glomerular Filtration Rate Estimate (Female): 79 mL/min/{1.73_m2} (ref >=60)
Glucose: 84 mg/dL (ref 74–106)
Osmolality Calculation: 277.2 mOsm/kg (ref 275.0–300.0)
Potassium: 3.68 mmol/L (ref 3.50–5.10)
Protein Total: 7.7 g/dL (ref 6.4–8.2)
Sodium: 139 mmol/L (ref 135–145)

## 2021-02-28 LAB — CEA: CEA: 2.2 ng/mL (ref 0.0–5.0)

## 2021-03-11 ENCOUNTER — Inpatient Hospital Stay: Admit: 2021-03-11 | Discharge: 2021-04-07

## 2021-03-11 ENCOUNTER — Encounter

## 2021-03-11 DIAGNOSIS — N200 Calculus of kidney: Secondary | ICD-10-CM

## 2021-03-11 MED ORDER — iopamidoL (ISOVUE-300) 61 % injection 100 mL
300 | Freq: Once | INTRAVENOUS | Status: AC
Start: 2021-03-11 — End: 2021-03-11
  Administered 2021-03-11: 21:00:00 300 mL via INTRAVENOUS

## 2021-03-18 ENCOUNTER — Ambulatory Visit: Admit: 2021-03-18 | Discharge: 2021-03-18 | Attending: FNP

## 2021-03-18 DIAGNOSIS — C189 Malignant neoplasm of colon, unspecified: Secondary | ICD-10-CM

## 2021-03-18 NOTE — Progress Notes (Signed)
Pt is feeling good, no new symptoms or concerns.

## 2021-03-18 NOTE — Progress Notes (Addendum)
NAME:  Brandy [name]  MRN:  [id number]  DOB:  July 25, 1955    PROGRESS NOTE      REASON FOR FOLLOW-UP:     This is a 65 y.o. female who presents today for a follow-up regarding Malignant neoplasm of colon, unspecified part of colon (HCC) [C18.9] .    MEDICAL ONCOLOGY HISTORY:  09/08/2020 - per Dr. Blair Heys note    [id number] woman with history of stage 2 colon cancer.  ?  The patient was diagnosed with an adenocarcinoma of the colon in 11/2016 and she underwent resection 12/28/16.  Path showed pT3N0 tumor:  Grade 1, no LVI, no perineural invasion, 0/29 LNs, no perforation and negative margins.  ?  Given no high risk features, no adjuvant chemotherapy was recommended and she has been under surveillance since that time.  ?  Of note, the patient also has a history of an aortic valve replacement in 2017 and she states she has had some   shortness of breath since that time.  She is followed by cardiology.     INTERVAL HISTORY :  65 year old female here for 6 month follow-up for her colon cancer.    She states she is doing well today.  She is active, sleeping well.  Has a good appetite.  No fevers, chills, night sweats.  No headaches or vision changes.  No new respiratory issues, no new cough, hemoptysis.  She does have chronic issue with shortness of breath since before her heart surgery in 2017 - unchanged.  She and her husband are on a new diet, he was just dx as DM, so has cut back on starchy foods and eating more vegetables.  Her BM are looser, no blood.  No urinary issues, no hematuria.  Intermittent LE edema, if on her feet a lot, occasional tingling in her feet.      PAST MEDICAL HISTORY:    Past Medical History:   Diagnosis Date   . Arrhythmia    . CHF (congestive heart failure) (HCC)    . Colon cancer (HCC) 12/28/2016   . Colonic mass 11/28/2016    POSTOPERATIVE DIAGNOSIS 11/28/16 colonoscopy:  1) Colonoscopy with 3/4 circumference apple core lesion at full insertion of the colonoscope but likely distal to the Hepatic  flexure 2) Hemorrhoids: mild internal 3) Mass: hot forceps biopsied times two without complications and tattooed times two proximal to the mass  Biopsy suspicious for intramucosal adenocarcinoma. Referral to Dr Oran Rein done and she   . Encounter for screening colonoscopy 12/05/2017   . History of blood transfusion    . HIT (heparin-induced thrombocytopenia)    . Hyperlipidemia    . Screening for colon cancer 10/04/2016    POSTOPERATIVE DIAGNOSIS 11/28/16 colonoscopy:  1) Colonoscopy with 3/4 circumference apple core lesion at full insertion of the colonoscope but likely distal to the Hepatic flexure 2) Hemorrhoids: mild internal 3) Mass: hot forceps biopsied times two without complications and tattooed times two proximal to the mass  Biopsy suspicious for intramucosal adenocarcinoma. Referral to Dr Oran Rein done and she   . Thyroid disease     Hyperparathyroid       CURRENT MEDICATIONS AND ALLERGIES:      Current Outpatient Medications:   .  aspirin 81 MG EC tablet, Take 81 mg by mouth daily., Disp: , Rfl:   .  atorvastatin (LIPITOR) 20 MG tablet, Take 1 tablet by mouth every night at bedtime., Disp: 90 tablet, Rfl: 3  .  calcium carbonate/vitamin D3 (  CALCIUM 500 + D ORAL), Take 1 tablet by mouth daily., Disp: , Rfl:   .  carvediloL (COREG) 6.25 MG tablet, Take 1 tablet by mouth 2 times daily with meals., Disp: 180 tablet, Rfl: 1  .  cholecalciferol, vitamin D3, (VITAMIN D3) 50 mcg (2,000 unit) Tab, Take 2,000 Units by mouth daily. , Disp: , Rfl:   .  EPINEPHrine (EPIPEN) 0.3 mg/0.3 mL AtIn injection, Inject 0.3 mL into the muscle once for 1 dose. (Patient not taking: Reported on 10/22/2020), Disp: 0.3 mL, Rfl: 0  .  ibuprofen 200 mg Cap, Take by mouth as needed., Disp: , Rfl:     Allergies   Allergen Reactions   . Heparin Anaphylaxis     Possible HIT   . Sulfa (Sulfonamide Antibiotics) Rash     In ICU for 3 days post dose         PATIENT HISTORY:    Social History     Tobacco Use   . Smoking status: Passive Smoke Exposure -  Never Smoker   . Smokeless tobacco: Never Used   . Tobacco comment: SBIRT 02/13/2018 [facility] Father smoked in the home.    Substance Use Topics   . Alcohol use: No   . Drug use: No       Family History   Problem Relation Age of Onset   . Colon cancer Mother         dx at older age   . Heart disease Father    . Heart disease Sister    . Cancer Brother    . Heart disease Maternal Aunt    . Ovarian cancer Maternal Aunt    . Cancer Maternal Uncle    . Heart disease Maternal Uncle    . Breast cancer Paternal Aunt        REVIEW OF SYSTEMS:      Review of Systems   Constitutional: Negative.    HENT:  Negative.    Eyes: Negative.    Respiratory: Positive for shortness of breath.         Since before her heart surgery - unchanged   Cardiovascular: Negative.    Gastrointestinal: Negative.    Endocrine: Negative.    Genitourinary: Negative.     Musculoskeletal: Negative.    Skin: Negative.    Neurological: Negative.    Hematological: Negative.    Psychiatric/Behavioral: Negative.          PHYSICAL EXAM:    Vitals:    03/18/21 1334   BP: 149/82   BP Location: Right arm   Pulse: 72   Temp: 36.1 ?C (96.9 ?F)   SpO2: 96%   Weight: 273 lb 9.6 oz (124.1 kg)   Height: 5' 3.5 (1.613 m)        Physical Exam  Constitutional:       Appearance: Normal appearance.   HENT:      Head: Normocephalic and atraumatic.      Mouth/Throat:      Mouth: Mucous membranes are moist.      Pharynx: Oropharynx is clear.   Eyes:      Extraocular Movements: Extraocular movements intact.   Cardiovascular:      Rate and Rhythm: Normal rate and regular rhythm.      Pulses: Normal pulses.      Heart sounds: Normal heart sounds.   Pulmonary:      Effort: Pulmonary effort is normal.      Breath sounds: Normal breath sounds.  Abdominal:      General: Abdomen is flat. Bowel sounds are normal.      Palpations: Abdomen is soft.      Comments: No hepatosplenomegaly   Musculoskeletal:         General: Normal range of motion.      Cervical back: Normal range of motion and  neck supple.   Skin:     General: Skin is warm and dry.      Capillary Refill: Capillary refill takes less than 2 seconds.   Neurological:      General: No focal deficit present.      Mental Status: She is alert and oriented to person, place, and time.   Psychiatric:         Mood and Affect: Mood normal.         Behavior: Behavior normal.         Thought Content: Thought content normal.         Judgment: Judgment normal.     LABS:    No visits with results within 2 Week(s) from this visit.   Latest known visit with results is:   Lab Walk-In on 02/28/2021   Component Date Value   . CEA 02/28/2021 2.2    . Sodium 02/28/2021 139    . Potassium 02/28/2021 3.68    . Chloride 02/28/2021 102.3    . CO2 - Carbon Dioxide 02/28/2021 25    . BUN 02/28/2021 14    . Creatinine 02/28/2021 0.79    . Glucose 02/28/2021 84    . Calcium 02/28/2021 9.6    . AST - Aspartate Aminotra* 02/28/2021 21    . ALT - Alanine Aminotrans* 02/28/2021 17    . Alkaline Phosphatase 02/28/2021 106 (A)   . Protein Total 02/28/2021 7.7    . Albumin 02/28/2021 4.3    . Bilirubin Total 02/28/2021 0.50    . Anion Gap 02/28/2021 11.7    . Albumin/Globulin Ratio 02/28/2021 1.3    . Glomerular Filtration Ra* 02/28/2021 79    . GFR Additional Info 02/28/2021     . BUN / Creatinine Ratio 02/28/2021 17.7    . Osmolality Calculation 02/28/2021 277.2    . WBC 02/28/2021 5.3    . RBC 02/28/2021 4.20    . Hemoglobin 02/28/2021 12.8    . HCT 02/28/2021 38.4    . MCV 02/28/2021 91.5    . [facility] 02/28/2021 30.6    . MCHC 02/28/2021 33.4    . RDW 02/28/2021 15.7    . Platelet Count 02/28/2021 223    . MPV 02/28/2021 7.5    . Neutrophils % 02/28/2021 54    . Lymphocytes % 02/28/2021 36    . Monocytes % 02/28/2021 8    . Eosinophils % 02/28/2021 2    . Basophils % 02/28/2021 1    . Neutrophils, Absolute 02/28/2021 2.9    . Lymphocytes, Absolute 02/28/2021 1.9    . Monocytes, Absolute 02/28/2021 0.4    . Eosinophils, Absolute 02/28/2021 0.1    . Basophils, Absolute 02/28/2021  0.0         IMAGING:    CT chest abdomen pelvis with IV contrast 03/11/2021    Narrative  EXAMINATION:  CT CHEST ABDOMEN PELVIS W IV CONTRAST, [id number]    HISTORY:  history of colon cancer, surveillance imaging    COMPARISON:  Multiple prior studies, including CT abdomen pelvis 03/02/2020.    TECHNIQUE:  After obtaining the patient's consent, 3mm CT images were  obtained with intravenous contrast material.  Non-ionic contrast material was used. Automated exposure control based on patient size was utilized as a dose reduction technique.    FINDINGS:  CHEST:    PLEURA:  No effusion or pneumothorax.  LUNGS/AIRWAYS: Chronic areas of mild bibasilar interstitial thickening and patchy areas of ground-glass, similar to prior and likely due to chronic inflammatory process.  Unchanged focal areas of chronic nodularity/scarring in the posterior lingula again  measuring approximately 1.5 cm and 1.1 cm, respectively (axial 48, 54).  Mild bilateral dependent atelectasis.  Chronic moderately elevated right hemidiaphragm.  MEDIASTINUM: Aortic valve replacement.  Retained epicardial pacing leads again noted.  Borderline cardiomegaly.  No pericardial effusion.  LYMPH NODES:  No enlarged lymph nodes.    ABDOMEN/PELVIS:    HEPATOBILIARY: No concerning liver lesions. Unchanged focal fat along the falciform ligament.  Unchanged small calcified stone/polyp again noted in the fundus of the otherwise unremarkable gallbladder.  No biliary ductal dilatation.  PANCREAS: Unremarkable.  SPLEEN: Lobulated.   No splenomegaly. Punctate calcified granuloma again noted.  ADRENALS: No nodules.  KIDNEYS: Punctate nonobstructing left upper pole renal calculus.  No hydronephrosis.  Partially duplicated left ureter.  No enhancing renal mass.  PELVIC ORGANS: The endometrium is not well-visualized but appears thickened with a possible polypoid lesion at the fundus.  Pelvic organs otherwise unremarkable.  BOWEL/PERITONEUM/RETROPERITONEUM: Post partial  colectomy.  No obstruction.  Stable small hiatal hernia.  Colonic diverticulosis without diverticulitis.   No intraperitoneal free air or fluid collection.  LYMPH NODES: No enlarged lymph nodes in the abdomen or pelvis.  VESSELS: No abdominal aortic aneurysm.    BONES/SOFT TISSUES:  No concerning osseous lesions.  S-shaped thoracic scoliosis again noted.  Post median sternotomy.  Moderately-sized, fat-containing subxiphoid ventral hernia.  Small fat-containing periumbilical hernia      IMPRESSION:  -No evidence of metastatic disease in the chest, abdomen, or pelvis.  -The endometrium is not well-visualized but appears thickened with a possible polypoid lesion at the fundus.  Recommend further evaluation with dedicated pelvic ultrasound.  -Chronic areas of mild bibasilar interstitial thickening and patchy areas of ground-glass, similar to prior and likely due to chronic inflammatory process.  -Additional chronic/incidental findings as detailed above.    Dictated by: Rogene Houston  Electronically Signed by: Rogene Houston on 03/11/2021 6:17 PM    Report ID#: [id number]        ASSESSMENT:  This is a 65 y.o. female who presents today for colon cancer.  pT3 N0 m0, stage II, adenocarcinoma.  S/P right-sided hemicolectomy 12/2016, no high risk features and therefore no adjuvant systemic therapy.  Patient is now on surveillance.  Patient is doing well.  Dr. Lorella Nimrod had noted that patient needed f/u colonoscopy per GI recommendations.  She had an initial post surgery colonoscopy 01/15/2017, with recommendation to repeat in 3-5 years.    Review of lab - CBC normal, CMP normal except for slight elevation of alk phos at 106.  CEA - 2.2.  Review of CT scan of C/A/P - as above.  Did discuss the finding of poorly visualized that appeared thickened with possible polypoid lesion at the fundus.  Patient agrees to have recommended evaluation with dedicated pelvic U/S.  Order will be placed and will notify her PCP to f/u.    PLAN:  1.  6  month follow-up with medical oncologist.  2.  Repeat lab to include CBC, CMP, and CEA prior to appointment.  3.  Schedule pelvic U/S, call PCP to f/u.  4.  CT scan of C/A/P in 1 year.  5.  Colonoscopy any time in the next few years for surveillance as recommended by GI physician.  6.  Well balanced diet with regular activity within tolerance due to patient's back issues.  7.  Call if any questions/concerns.    I spent about 25 minutes with the patient face-to-face discussing the above recommendations and addressing Her questions and concerns today, reviewing patient records, documenting the patient's visit.      Agustin Swatek [place], FNP  03/21/2021 - ADDENDUM  Patient was asked during evaluation about any episodes of vaginal bleeding, lower pelvic pain, unusual vaginal discharge, which she denied.    [facility]  [address]  [place], [address]  [phone]    We retain the right to modify this information without notice in the event of errors. Occasional errors in punctuation, grammar, and content occur. The patient has been advised to follow up with appointments. Patient has been informed that non-compliance with this recommendation could result in serious consequences because of delays in diagnosis and treatment. It also has been disclosed to the patient that we expect patients to take an active role in their health care and never assume that no news is good news.

## 2021-03-23 NOTE — Telephone Encounter (Signed)
Rinaldo Ratel the nurse practitioner at the cancer center called stating the pt had a CT done on 9/30 and she states they found an incidental finding the pt has a polyp in the endometrium. Rinaldo Ratel states they have also ordered a ultrasound and once the pt has that scheduled and done they will send the results to Dr. Pamelia Hoit. Please review.

## 2021-04-14 ENCOUNTER — Other Ambulatory Visit: Admit: 2021-04-14

## 2021-04-14 DIAGNOSIS — Z1211 Encounter for screening for malignant neoplasm of colon: Secondary | ICD-10-CM

## 2021-04-14 LAB — IMMUNOCHEMICAL FECAL OCCULT BLOOD: Fecal Occult Blood: POSITIVE — AB

## 2021-04-14 NOTE — Other (Signed)
Called patient to discuss positive FIT test. Let her know it is recommended to have a f/u colonoscopy, which she is ok with doing.    We also discussed her history of colon cancer and previous right hemicolectomy with Dr. Oran Rein. She stated that she would like to have colonoscopy and follow up with Dr. Oran Rein if possible.   She follows every 6 months with oncology.

## 2021-04-18 ENCOUNTER — Inpatient Hospital Stay: Admit: 2021-04-18 | Discharge: 2021-05-12 | Attending: FNP

## 2021-04-18 DIAGNOSIS — C189 Malignant neoplasm of colon, unspecified: Secondary | ICD-10-CM

## 2021-04-22 NOTE — Telephone Encounter (Signed)
I called patient and have scheduled her colonoscopy for 05/04/21. Written instructions will be sent to her in the mail. RM

## 2021-04-22 NOTE — Telephone Encounter (Signed)
-----   Message from Truitt Merle, MD sent at 04/15/2021 10:02 AM PDT -----  Word.    ----- Message -----  From: Brandy Bang, DO  Sent: 04/15/2021   8:14 AM PDT  To: Truitt Merle, MD    Called patient to discuss positive FIT test. Let her know it is recommended to have a f/u colonoscopy, which she is ok with doing.    We also discussed her history of colon cancer and previous right hemicolectomy with Dr. Oran Rein. She stated that she would like to have colonoscopy and follow up with Dr. Oran Rein if possible.   She follows every 6 months with oncology.

## 2021-04-28 ENCOUNTER — Ambulatory Visit: Admit: 2021-04-28 | Discharge: 2021-04-28 | Attending: MD

## 2021-04-28 DIAGNOSIS — R195 Other fecal abnormalities: Secondary | ICD-10-CM

## 2021-04-28 NOTE — Progress Notes (Signed)
Brandy [name] is a 65 y.o. female established patient being seen for diagnostic colonoscopy - positive FIT test and history of right hemicolectomy. Open right hemicolectomy done by Dr. Oran Rein in July 2018. RM    Pulse 69   Temp 36.9 ?C (98.4 ?F) (Temporal)   Resp 17   Ht 5' 3 (1.6 m)   Wt 267 lb (121.1 kg)   LMP  (LMP Unknown)   SpO2 95% Comment: Room  BMI 47.30 kg/m?       Ambulatory Nursing Form

## 2021-04-28 NOTE — Procedures (Signed)
[facility] PRE-OP COLONOSCOPY/ ENDOSCOPY INSTRUCTIONS    Dear Corrie Dandy,    It was a pleasure to talk to you on the phone today.      Date of Procedure: Wednesday May 04, 2021.    Your surgeon will contact you with your arrival time the working day prior to your procedure.     Please arrange for an adult to drive you home and be with you for 24 hours after your surgery. Please be sure to have accurate contact information for this person.    Arrive at the Day Surgery Entrance on [address], next to the [facility],  please wear a mask. One visitor is allowed at this time in the surgery waiting area. If you have no family or friend waiting in the surgery waiting area  please bring a working phone number so your discharge RN may call your ride when you are ready to leave.      Do not eat any solid food the entire day before your procedure. Clear liquids only, you may continue to have clear liquids up to 2 hours before coming to the hospital. Clear liquids include: black coffee, water, tea, Gatorade, apple juice,  white grape juice, and coconut water.      Avoid orange juice (and other pulpy juices), energy drinks, alcohol and coffee with dairy products.    *Please follow your surgeon's instructions about fluids if they differ from these.    If you develop a sore throat, fever or other illness please call your doctor.    Before leaving for the hospital:    . Take a soapy shower or bath and brush teeth.  . Please do not wear eye make-up.  Marland Kitchen Please wear NO jewelry, no watches, rings or necklaces, and remove all piercings.   . Please wear casual, non-restrictive clothing.  . Do not bring credit cards, cash, medication, jewelry, or other valuables.              Please wear glasses ,dentures  if you use these items. Do not wear contact lenses.     Additional instructions; Dr.Morton will be your anesthesiologist and would like you to take all of your regularly prescribed medications. It is not  necessary to take vitamins and supplements.    Patient understands clear liquid diet and prep instructions.    Pre-op Clinic phone: [phone]  [facility] RN

## 2021-04-28 NOTE — Progress Notes (Signed)
HISTORY OF PRESENT ILLNESS:    Brandy [name] is a 65 y.o. female referred for colonoscopy evaluation.  Patient is known to me from previous right hemicolectomy for colon cancer 4 years ago.  Patient has a recent positive fecal occult blood test.  She denies seeing any visible blood in her stool and reports no abdominal or rectal pain.  Patient has had no unintentional weight loss or anemia.  She is a non-smoker.  She takes aspirin daily and ibuprofen as needed.      MEDICAL / SURGICAL HISTORY:  Past Medical History:   Diagnosis Date   . Arrhythmia    . CHF (congestive heart failure) (HCC)    . Colon cancer (HCC) 12/28/2016   . Colonic mass 11/28/2016    POSTOPERATIVE DIAGNOSIS 11/28/16 colonoscopy:  1) Colonoscopy with 3/4 circumference apple core lesion at full insertion of the colonoscope but likely distal to the Hepatic flexure 2) Hemorrhoids: mild internal 3) Mass: hot forceps biopsied times two without complications and tattooed times two proximal to the mass  Biopsy suspicious for intramucosal adenocarcinoma. Referral to Dr Oran Rein done and she   . Encounter for screening colonoscopy 12/05/2017   . History of blood transfusion    . HIT (heparin-induced thrombocytopenia)    . Hyperlipidemia    . Screening for colon cancer 10/04/2016    POSTOPERATIVE DIAGNOSIS 11/28/16 colonoscopy:  1) Colonoscopy with 3/4 circumference apple core lesion at full insertion of the colonoscope but likely distal to the Hepatic flexure 2) Hemorrhoids: mild internal 3) Mass: hot forceps biopsied times two without complications and tattooed times two proximal to the mass  Biopsy suspicious for intramucosal adenocarcinoma. Referral to Dr Oran Rein done and she   . Thyroid disease     Hyperparathyroid        Past Surgical History:   Procedure Laterality Date   . CARDIAC SURGERY  01/23/2016    AVR   . COLON SURGERY  2018    Hemicolectomy   . CORONARY ANGIOPLASTY     . PROCEDURE N/A 01/27/2016    Procedure: AORTIC VALVE REPLACEMENT; INTRA AORTIC  BALLOON INSERTION;  Surgeon: Otho Bellows, MD;  Location: [facility] OR;  Service: Cardiology;  Laterality: N/A;   . PROCEDURE Right 03/10/2016    Procedure: AMPUTATION TOE;  Surgeon: Chrystie Nose, DPM;  Location: [facility] OR;  Service: Podiatry;  Laterality: Right;   . PROCEDURE N/A 11/28/2016    Procedure: COLONOSCOPY; BIOPSY;  Surgeon: Gailen Shelter, MD;  Location: [facility] OR;  Service: [facility] Procedures;  Laterality: N/A;  ink spot marking apple core mass proximal lesion distal to hepatic flexure   . PROCEDURE Right 12/28/2016    Procedure: LAPAROPSCOPIC CONVERTED TO OPEN HAND ASSISTED RIGHT HEMI COLECTOMY;  Surgeon: Truitt Merle, MD;  Location: [facility] OR;  Service: [facility] Procedures;  Laterality: Right;  Dr. Oran Rein wants 3 hours   . PROCEDURE N/A 01/15/2018    Procedure: COLONOSCOPY; W/ POSSIBLE BIOPSY & POLYPECTOMY;  Surgeon: Truitt Merle, MD;  Location: [facility] OR;  Service: [facility] Procedures;  Laterality: N/A;   . PROCEDURE N/A 03/19/2018    Procedure: PARATHYROIDECTOMY; RECURRENT LARYNGEAL NERVE MONITORING, INTRAOPERATIVE PARATHYROID HORMONE;  Surgeon: Edmund Hilda, MD;  Location: [facility] OR;  Service: Ear Nose and Throat;  Laterality: N/A;   . TONSILLECTOMY     . TUBAL LIGATION  1980        CURRENT MEDS:  Current Outpatient Medications   Medication Sig Dispense Refill   . aspirin 81 MG EC tablet Take 1  tablet by mouth every morning.     Marland Kitchen atorvastatin (LIPITOR) 20 MG tablet Take 1 tablet by mouth every night at bedtime. 90 tablet 3   . carvediloL (COREG) 6.25 MG tablet Take 1 tablet by mouth 2 times daily with meals. 180 tablet 1   . ibuprofen (ADVIL) 200 MG tablet Take 1 tablet by mouth 2 times daily as needed (pain).     . CALCIUM ORAL Take 1 tablet by mouth every morning.     . cholecalciferol, vitamin D3, (VITAMIN D3 ORAL) Take 1 tablet by mouth every morning.     Marland Kitchen EPINEPHrine (EPIPEN) 0.3 mg/0.3 mL AtIn injection Inject 0.3 mL into the muscle once for 1 dose. 0.3 mL 0     No current facility-administered medications for this  visit.        ALLERGIES:  Heparin and Sulfa (sulfonamide antibiotics)      Family History   Problem Relation Age of Onset   . Colon cancer Mother         dx at older age   . Heart disease Father    . Heart disease Sister    . Cancer Brother    . Heart disease Maternal Aunt    . Ovarian cancer Maternal Aunt    . Cancer Maternal Uncle    . Heart disease Maternal Uncle    . Breast cancer Paternal Aunt         Social History     Socioeconomic History   . Marital status: Married   Tobacco Use   . Smoking status: Never     Passive exposure: Yes   . Smokeless tobacco: Never   . Tobacco comments:     SBIRT 02/13/2018 [facility] Father smoked in the home.    Vaping Use   . Vaping Use: Never used   Substance and Sexual Activity   . Alcohol use: No   . Drug use: No   . Sexual activity: Not Currently     Partners: Male        Review of Systems  Systems reviewed, pertinent positives and negatives discussed in HPI.        PHYSICAL EXAM  Vitals:    04/28/21 1140   Pulse: 69   Resp: 17   Temp: 36.9 ?C (98.4 ?F)   TempSrc: Temporal   SpO2: 95%   Weight: 267 lb (121.1 kg)   Height: 5' 3 (1.6 m)      Body mass index is 47.3 kg/m?Marland Kitchen    General: no acute distress, alert and oriented x3   HEENT: no scleral icterus   Abdomen: soft, non tender  Musculoskeletal/extremities: well perfused bilaterally  Neurologic: no focal deficits.    LABS: Reviewed    Lab Results   Component Value Date    WHITEBLOODCE 5.3 02/28/2021    HGB 12.8 02/28/2021    HCT 38.4 02/28/2021    MCV 91.5 02/28/2021    LABPLAT 223 02/28/2021     Lab Results   Component Value Date    NA 139 02/28/2021    K 3.68 02/28/2021    CL 102.3 02/28/2021    CO2 25 02/28/2021    BUN 14 02/28/2021    CREATININES 0.79 02/28/2021    BCR 17.7 02/28/2021    GLU 84 02/28/2021    CALCIUM 9.6 02/28/2021    AST 21 02/28/2021    ALT 17 02/28/2021    ALKPHOS 106 (H) 02/28/2021    PROT 7.7 02/28/2021  ALBUMIN 4.3 02/28/2021    BILITOT 0.50 02/28/2021    ANIONGAP 11.7 02/28/2021    LABGLOM 79  02/28/2021    GFRADDINFO  02/28/2021      Comment:      For additional information, please visit the National Kidney Disease Education Program (NKDEP) at [email or web address]    OSMOLALITY 277.2 02/28/2021    AGRATIO 1.3 02/28/2021       ASSESSMENT & PLAN      ICD-10-CM    1. Positive fecal occult blood test  R19.5       2. History of right hemicolectomy  Z90.49       3. Personal history of colon cancer  Z85.038           I reviewed the utility, risks, benefits, and alternatives of colonoscopy and possible biopsy/polypectomy with the patient including bleeding, injury to colon or other internal organs, missed polyp or other lesions, and potential need for additional procedures/operations. The procedure was described in detail and pre/post procedure expectations reviewed. Bowel prep prescription and instructions were provided and all questions answered. A detailed PARQ conference was completed and informed consent was obtained.  We will schedule for the next available. Thank you for allowing me to participate in the care of this patient.

## 2021-04-28 NOTE — H&P (View-Only) (Signed)
HISTORY OF PRESENT ILLNESS:    Brandy [name] is a 65 y.o. female referred for colonoscopy evaluation.  Patient is known to me from previous right hemicolectomy for colon cancer 4 years ago.  Patient has a recent positive fecal occult blood test.  She denies seeing any visible blood in her stool and reports no abdominal or rectal pain.  Patient has had no unintentional weight loss or anemia.  She is a non-smoker.  She takes aspirin daily and ibuprofen as needed.      MEDICAL / SURGICAL HISTORY:  Past Medical History:   Diagnosis Date   . Arrhythmia    . CHF (congestive heart failure) (HCC)    . Colon cancer (HCC) 12/28/2016   . Colonic mass 11/28/2016    POSTOPERATIVE DIAGNOSIS 11/28/16 colonoscopy:  1) Colonoscopy with 3/4 circumference apple core lesion at full insertion of the colonoscope but likely distal to the Hepatic flexure 2) Hemorrhoids: mild internal 3) Mass: hot forceps biopsied times two without complications and tattooed times two proximal to the mass  Biopsy suspicious for intramucosal adenocarcinoma. Referral to Dr Mathew Postiglione done and she   . Encounter for screening colonoscopy 12/05/2017   . History of blood transfusion    . HIT (heparin-induced thrombocytopenia)    . Hyperlipidemia    . Screening for colon cancer 10/04/2016    POSTOPERATIVE DIAGNOSIS 11/28/16 colonoscopy:  1) Colonoscopy with 3/4 circumference apple core lesion at full insertion of the colonoscope but likely distal to the Hepatic flexure 2) Hemorrhoids: mild internal 3) Mass: hot forceps biopsied times two without complications and tattooed times two proximal to the mass  Biopsy suspicious for intramucosal adenocarcinoma. Referral to Dr Janiesha Diehl done and she   . Thyroid disease     Hyperparathyroid        Past Surgical History:   Procedure Laterality Date   . CARDIAC SURGERY  01/23/2016    AVR   . COLON SURGERY  2018    Hemicolectomy   . CORONARY ANGIOPLASTY     . PROCEDURE N/A 01/27/2016    Procedure: AORTIC VALVE REPLACEMENT; INTRA AORTIC  BALLOON INSERTION;  Surgeon: Nicholas Engstrom, MD;  Location: [facility] OR;  Service: Cardiology;  Laterality: N/A;   . PROCEDURE Right 03/10/2016    Procedure: AMPUTATION TOE;  Surgeon: Evan Chard Merrill, DPM;  Location: [facility] OR;  Service: Podiatry;  Laterality: Right;   . PROCEDURE N/A 11/28/2016    Procedure: COLONOSCOPY; BIOPSY;  Surgeon: Robert Jackman, MD;  Location: [facility] OR;  Service: [facility] Procedures;  Laterality: N/A;  ink spot marking apple core mass proximal lesion distal to hepatic flexure   . PROCEDURE Right 12/28/2016    Procedure: LAPAROPSCOPIC CONVERTED TO OPEN HAND ASSISTED RIGHT HEMI COLECTOMY;  Surgeon: Markeisha Mancias, MD;  Location: [facility] OR;  Service: [facility] Procedures;  Laterality: Right;  Dr. Turhan Chill wants 3 hours   . PROCEDURE N/A 01/15/2018    Procedure: COLONOSCOPY; W/ POSSIBLE BIOPSY & POLYPECTOMY;  Surgeon: Floella Ensz, MD;  Location: [facility] OR;  Service: [facility] Procedures;  Laterality: N/A;   . PROCEDURE N/A 03/19/2018    Procedure: PARATHYROIDECTOMY; RECURRENT LARYNGEAL NERVE MONITORING, INTRAOPERATIVE PARATHYROID HORMONE;  Surgeon: Sean J Traynor, MD;  Location: [facility] OR;  Service: Ear Nose and Throat;  Laterality: N/A;   . TONSILLECTOMY     . TUBAL LIGATION  1980        CURRENT MEDS:  Current Outpatient Medications   Medication Sig Dispense Refill   . aspirin 81 MG EC tablet Take 1   tablet by mouth every morning.     . atorvastatin (LIPITOR) 20 MG tablet Take 1 tablet by mouth every night at bedtime. 90 tablet 3   . carvediloL (COREG) 6.25 MG tablet Take 1 tablet by mouth 2 times daily with meals. 180 tablet 1   . ibuprofen (ADVIL) 200 MG tablet Take 1 tablet by mouth 2 times daily as needed (pain).     . CALCIUM ORAL Take 1 tablet by mouth every morning.     . cholecalciferol, vitamin D3, (VITAMIN D3 ORAL) Take 1 tablet by mouth every morning.     . EPINEPHrine (EPIPEN) 0.3 mg/0.3 mL AtIn injection Inject 0.3 mL into the muscle once for 1 dose. 0.3 mL 0     No current facility-administered medications for this  visit.        ALLERGIES:  Heparin and Sulfa (sulfonamide antibiotics)      Family History   Problem Relation Age of Onset   . Colon cancer Mother         dx at older age   . Heart disease Father    . Heart disease Sister    . Cancer Brother    . Heart disease Maternal Aunt    . Ovarian cancer Maternal Aunt    . Cancer Maternal Uncle    . Heart disease Maternal Uncle    . Breast cancer Paternal Aunt         Social History     Socioeconomic History   . Marital status: Married   Tobacco Use   . Smoking status: Never     Passive exposure: Yes   . Smokeless tobacco: Never   . Tobacco comments:     SBIRT 02/13/2018 [facility] Father smoked in the home.    Vaping Use   . Vaping Use: Never used   Substance and Sexual Activity   . Alcohol use: No   . Drug use: No   . Sexual activity: Not Currently     Partners: Male        Review of Systems  Systems reviewed, pertinent positives and negatives discussed in HPI.        PHYSICAL EXAM  Vitals:    04/28/21 1140   Pulse: 69   Resp: 17   Temp: 36.9 ?C (98.4 ?F)   TempSrc: Temporal   SpO2: 95%   Weight: 267 lb (121.1 kg)   Height: 5' 3 (1.6 m)      Body mass index is 47.3 kg/m?.    General: no acute distress, alert and oriented x3   HEENT: no scleral icterus   Abdomen: soft, non tender  Musculoskeletal/extremities: well perfused bilaterally  Neurologic: no focal deficits.    LABS: Reviewed    Lab Results   Component Value Date    WHITEBLOODCE 5.3 02/28/2021    HGB 12.8 02/28/2021    HCT 38.4 02/28/2021    MCV 91.5 02/28/2021    LABPLAT 223 02/28/2021     Lab Results   Component Value Date    NA 139 02/28/2021    K 3.68 02/28/2021    CL 102.3 02/28/2021    CO2 25 02/28/2021    BUN 14 02/28/2021    CREATININES 0.79 02/28/2021    BCR 17.7 02/28/2021    GLU 84 02/28/2021    CALCIUM 9.6 02/28/2021    AST 21 02/28/2021    ALT 17 02/28/2021    ALKPHOS 106 (H) 02/28/2021    PROT 7.7 02/28/2021      ALBUMIN 4.3 02/28/2021    BILITOT 0.50 02/28/2021    ANIONGAP 11.7 02/28/2021    LABGLOM 79  02/28/2021    GFRADDINFO  02/28/2021      Comment:      For additional information, please visit the National Kidney Disease Education Program (NKDEP) at http://nkdep.nih.gov/    OSMOLALITY 277.2 02/28/2021    AGRATIO 1.3 02/28/2021       ASSESSMENT & PLAN      ICD-10-CM    1. Positive fecal occult blood test  R19.5       2. History of right hemicolectomy  Z90.49       3. Personal history of colon cancer  Z85.038           I reviewed the utility, risks, benefits, and alternatives of colonoscopy and possible biopsy/polypectomy with the patient including bleeding, injury to colon or other internal organs, missed polyp or other lesions, and potential need for additional procedures/operations. The procedure was described in detail and pre/post procedure expectations reviewed. Bowel prep prescription and instructions were provided and all questions answered. A detailed PARQ conference was completed and informed consent was obtained.  We will schedule for the next available. Thank you for allowing me to participate in the care of this patient.

## 2021-04-29 ENCOUNTER — Encounter: Admit: 2021-04-29 | Discharge: 2021-04-29

## 2021-04-29 NOTE — Progress Notes (Signed)
HOME MEDICATION LIST REVIEWED BY PHARMACIST        Prior To Admission Home Medication list in Epic was prepared during this inpatient visit by a team member other than a pharmacist in a best attempt to document medications that the patient takes in the outpatient setting:      Home Medications     Med List Status: [facility] Review In Progress Set By: Pennelope Bracken, CPhT at 04/28/2021  3:09 PM            aspirin 81 MG EC tablet     Take 1 tablet by mouth every morning.     atorvastatin (LIPITOR) 20 MG tablet     Take 1 tablet by mouth every night at bedtime.     CALCIUM ORAL     Take 1 tablet by mouth every morning.     carvediloL (COREG) 6.25 MG tablet     Take 1 tablet by mouth 2 times daily with meals.     cholecalciferol, vitamin D3, (VITAMIN D3 ORAL)     Take 1 tablet by mouth every morning.     EPINEPHrine (EPIPEN) 0.3 mg/0.3 mL AtIn injection     Inject 0.3 mL into the muscle once for 1 dose.     ibuprofen (ADVIL) 200 MG tablet     Take 1 tablet by mouth 2 times daily as needed (pain).          The pharmacist has reviewed the list, without direct patient communication, and has not identified any concerns that are anticipated to cause harm in a typical patient.     The following medications are non-formulary at [facility]:    NONE    If any of the above non-formulary medications are to be ordered for inpatient use please have the patient supply them, and send to the inpatient pharmacy for identification and barcoding.    Inaccuracies may still exist, and the home medication list has not been clinically evaluated for appropriate indications; please use clinical judgment when reconciling the list.    Signed by: Janelle Floor, Pharmacist

## 2021-05-03 NOTE — Telephone Encounter (Signed)
Patient called to confirm procedure for 05/04/21.  Provided information from note below:    Procedure on 05/04/21 with a check in time of 10:00am. Reminded them to be on all clear liquids the day before - meaning NO breakfast, lunch, or dinner, and nothing to snack on the day before procedure. Reminded them to drink their bowel prep medication according to the times written on their instruction sheet. They are allowed to drink approved clear fluids up until 2 hours prior to their check in time. Reminded them that they will need a ride home afterwards when leaving the hospital.

## 2021-05-03 NOTE — Telephone Encounter (Signed)
Left message for patient reminding them of procedure on 05/04/21 with a check in time of 10:00am. Reminded them to be on all clear liquids the day before - meaning NO breakfast, lunch, or dinner, and nothing to snack on the day before procedure. Reminded them to drink their bowel prep medication according to the times written on their instruction sheet. They are allowed to drink approved clear fluids up until 2 hours prior to their check in time. Reminded them that they will need a ride home afterwards when leaving the hospital. Left our office number to call back if they have any questions or are unable to attend their scheduled procedure.

## 2021-05-03 NOTE — Anesthesia Pre-Procedure Evaluation (Signed)
Anesthesia Evaluation     Patient summary reviewed and Nursing notes reviewed    No history of anesthetic complications     Airway   Mallampati: II  TM distance: > 8 cm  Neck ROM: full  Dental    (+) age appropriate    Pulmonary - negative ROS and normal exam    breath sounds clear to auscultation  Cardiovascular   Exercise tolerance: good (4-7 METS)  (+) CAD, CHF,     ECG reviewed  Rhythm: regular  Rate: normal    Neuro/Psych - negative ROS     GI/Hepatic/Renal - negative ROS     Endo/Other - negative ROS    Musculoskeletal - negative ROS                   Anesthesia Plan    ASA 3     MAC     intravenous induction         Consenting person understands and agrees to proceed. anesthesia consent form used. PARQ.

## 2021-05-04 LAB — TISSUE EXAM: Preoperative Diagnosis: POSITIVE

## 2021-05-04 MED ORDER — propofoL (DIPRIVAN) injection
10 | INTRAVENOUS | Status: DC | PRN
Start: 2021-05-04 — End: 2021-05-04
  Administered 2021-05-04 (×2): 10 mg/mL via INTRAVENOUS

## 2021-05-04 MED ORDER — metoprolol (LOPRESSOR) injection 2.5 mg
5 | INTRAVENOUS | Status: DC | PRN
Start: 2021-05-04 — End: 2021-05-04
  Administered 2021-05-04: 19:00:00 5 mg via INTRAVENOUS

## 2021-05-04 MED ORDER — sodium chloride 0.9 % (NS) infusion
INTRAVENOUS | Status: DC
Start: 2021-05-04 — End: 2021-05-04
  Administered 2021-05-04 (×2): via INTRAVENOUS
  Administered 2021-05-04: 20:00:00

## 2021-05-04 MED ORDER — metoprolol (LOPRESSOR) 5 mg/5 mL injection
5 | INTRAVENOUS | Status: AC
Start: 2021-05-04 — End: ?

## 2021-05-04 MED ORDER — hydrALAZINE (APRESOLINE) injection
20 | INTRAMUSCULAR | Status: DC | PRN
Start: 2021-05-04 — End: 2021-05-04
  Administered 2021-05-04: 20:00:00 20 mg/mL via INTRAVENOUS

## 2021-05-04 MED ORDER — metoprolol (LOPRESSOR) injection 2.5 mg
5 | INTRAVENOUS | Status: DC | PRN
Start: 2021-05-04 — End: 2021-05-04

## 2021-05-04 MED ORDER — propofoL (DIPRIVAN) 10 mg/mL injection
10 | INTRAVENOUS | Status: AC
Start: 2021-05-04 — End: ?

## 2021-05-04 MED ORDER — propofoL (DIPRIVAN) injection
10 | INTRAVENOUS | Status: DC | PRN
Start: 2021-05-04 — End: 2021-05-04
  Administered 2021-05-04 (×3): 10 mg/mL via INTRAVENOUS

## 2021-05-04 NOTE — Anesthesia Post-Procedure Evaluation (Signed)
Patient Name: Brandy [name]  Procedures performed: Procedure(s):  COLONOSCOPY; W/ POSSIBLE BIOPSY & POLYPECTOMY    Last Vitals:   Vitals Value Taken Time   BP 153/95 05/04/21 1250   Pulse 78 05/04/21 1250   Resp 18 05/04/21 1230   SpO2 94 % 05/04/21 1250   Temp 36 ?C 05/04/21 1250       Planned Anesthesia Type: MAC  Final Anesthesia Type: MAC  <ANESPOSTNOTE>    Idelle Jo, MD  2:09 PM

## 2021-05-04 NOTE — Discharge Instructions (Signed)
Colonoscopy, Adult, Care After  This sheet gives you information about how to care for yourself after your procedure. Your doctor may also give you more specific instructions. If you have problems or questions, call your doctor.  What can I expect after the procedure?  After the procedure, it is common to have:  A small amount of blood in your poop (stool) for 24 hours.  Some gas.  Mild cramping or bloating in your belly (abdomen).  Follow these instructions at home:  Eating and drinking    Drink enough fluid to keep your pee (urine) pale yellow.  Follow instructions from your doctor about what you cannot eat or drink.  Return to your normal diet as told by your doctor. Avoid heavy or fried foods that are hard to digest.    Activity  Rest as told by your doctor.  Do not sit for a long time without moving. Get up to take short walks every 1-2 hours. This is important. Ask for help if you feel weak or unsteady.  Return to your normal activities as told by your doctor. Ask your doctor what activities are safe for you.  To help cramping and bloating:    Try walking around.  Put heat on your belly as told by your doctor. Use the heat source that your doctor recommends, such as a moist heat pack or a heating pad.  Put a towel between your skin and the heat source.  Leave the heat on for 20-30 minutes.  Remove the heat if your skin turns bright red. This is very important if you are unable to feel pain, heat, or cold. You may have a greater risk of getting burned.    General instructions  If you were given a medicine to help you relax (sedative) during your procedure, it can affect you for many hours. Do not drive or use machinery until your doctor says that it is safe.  For the first 24 hours after the procedure:  Do not sign important documents.  Do not drink alcohol.  Do your daily activities more slowly than normal.  Eat foods that are soft and easy to digest.  Take over-the-counter or prescription medicines only as  told by your doctor.  Keep all follow-up visits as told by your doctor. This is important.  Contact a doctor if:  You have blood in your poop 2-3 days after the procedure.  Get help right away if:  You have more than a small amount of blood in your poop.  You see large clumps of tissue (blood clots) in your poop.  Your belly is swollen.  You feel like you may vomit (nauseous).  You vomit.  You have a fever.  You have belly pain that gets worse, and medicine does not help your pain.  Summary  After the procedure, it is common to have a small amount of blood in your poop. You may also have mild cramping and bloating in your belly.  If you were given a medicine to help you relax (sedative) during your procedure, it can affect you for many hours. Do not drive or use machinery until your doctor says that it is safe.  Get help right away if you have a lot of blood in your poop, feel like you may vomit, have a fever, or have more belly pain.  This information is not intended to replace advice given to you by your health care provider. Make sure you discuss any questions you have   with your health care provider.  Document Revised: 04/04/2019 Document Reviewed: 12/23/2018  Elsevier Patient Education ? 2022 Else[employer]  General Anesthesia, Adult, Care After  This sheet gives you information about how to care for yourself after your procedure. Your health care provider may also give you more specific instructions. If you have problems or questions, contact your health care provider.  What can I expect after the procedure?  After the procedure, the following side effects are common:  Pain or discomfort at the IV site.  Nausea.  Vomiting.  Sore throat.  Trouble concentrating.  Feeling cold or chills.  Feeling weak or tired.  Sleepiness and fatigue.  Soreness and body aches. These side effects can affect parts of the body that were not involved in surgery.  Follow these instructions at home:  For the time period you were told by  your health care provider:    Rest.  Do not participate in activities where you could fall or become injured.  Do not drive or use machinery.  Do not drink alcohol.  Do not take sleeping pills or medicines that cause drowsiness.  Do not make important decisions or sign legal documents.  Do not take care of children on your own.    Eating and drinking  Follow any instructions from your health care provider about eating or drinking restrictions.  When you feel hungry, start by eating small amounts of foods that are soft and easy to digest (bland), such as toast. Gradually return to your regular diet.  Drink enough fluid to keep your urine pale yellow.  If you vomit, rehydrate by drinking water, juice, or clear broth.  General instructions  If you have sleep apnea, surgery and certain medicines can increase your risk for breathing problems. Follow instructions from your health care provider about wearing your sleep device:  Anytime you are sleeping, including during daytime naps.  While taking prescription pain medicines, sleeping medicines, or medicines that make you drowsy.  Have a responsible adult stay with you for the time you are told. It is important to have someone help care for you until you are awake and alert.  Return to your normal activities as told by your health care provider. Ask your health care provider what activities are safe for you.  Take over-the-counter and prescription medicines only as told by your health care provider.  If you smoke, do not smoke without supervision.  Keep all follow-up visits as told by your health care provider. This is important.  Contact a health care provider if:  You have nausea or vomiting that does not get better with medicine.  You cannot eat or drink without vomiting.  You have pain that does not get better with medicine.  You are unable to pass urine.  You develop a skin rash.  You have a fever.  You have redness around your IV site that gets worse.  Get help right  away if:  You have difficulty breathing.  You have chest pain.  You have blood in your urine or stool, or you vomit blood.  Summary  After the procedure, it is common to have a sore throat or nausea. It is also common to feel tired.  Have a responsible adult stay with you for the time you are told. It is important to have someone help care for you until you are awake and alert.  When you feel hungry, start by eating small amounts of foods that are soft and easy to   digest (bland), such as toast. Gradually return to your regular diet.  Drink enough fluid to keep your urine pale yellow.  Return to your normal activities as told by your health care provider. Ask your health care provider what activities are safe for you.  This information is not intended to replace advice given to you by your health care provider. Make sure you discuss any questions you have with your health care provider.  Document Revised: 02/12/2020 Document Reviewed: 09/11/2019  Elsevier Patient Education ? 2022 Elsevier Inc.

## 2021-05-04 NOTE — Interval H&P Note (Signed)
I have assessed the patient.    No interval change in H&P.    [facility] Patient Class Confirmed.

## 2021-05-04 NOTE — Op Note (Signed)
PREOPERATIVE DIAGNOSIS: Positive fecal occult blood test, history of colon cancer, history of right hemicolectomy    POSTOPERATIVE DIAGNOSIS: Same    PROCEDURE: Diagnostic colonoscopy with polypectomy    SURGEON: Truitt Merle, MD, FACS    ANESTHESIOLOGIST: Idelle Jo, MD    ANESTHESIA: MAC    EBL: <62mL    INDICATIONS FOR PROCEDURE: This is a 64 year old female known to me from previous right hemicolectomy for colon cancer.  She had a recent positive fecal occult blood test although she denies seeing gross blood in her stool.    FINDINGS: Large pedunculated rectal polyp removed completely with a hot snare.  Intact ileocolic anastomosis, otherwise unremarkable colonoscopy    SPECIMEN:  Rectal polyp      DESCRIPTION OF PROCEDURE:      Informed consent was obtained, after which point the patient was taken to the endoscopy suite and transitioned to the left lateral decubitus position.  Anesthetic monitoring devices were placed and moderate sedation was pursued by the anesthesia team. An unremarkable digital rectal exam was performed. A lubricated colonoscope was then passed to the ileocolic anastomosis which was intact and without any gross mucosal pathology evident.  The scope was then slowly withdrawn with care to scrutinize the colonic mucosa circumferentially.  No mucosal abnormalities were seen.  The rectum was assessed in the forward flexion and retroflexion views.  Findings and procedures are described above.  The patient had an adequate bowel prep.  At the conclusion of the procedure insufflation was suctioned and colonoscope completely removed. Overall the patient tolerated the procedure well.  There were no complications.  The patient was taken to the recovery room in stable condition.       Disposition: Patient was discharged to home after all criteria were met in the post anesthesia care unit. Patient is advised and/or responsible to review their resumed home medications, and any new medications provided,  that have been reconciled at the end of this encounter with their primary care provider (both on and not on the Epic provider network).     Final Diagnosis: Rectal polyp, history of colon cancer, history of right hemicolectomy    Follow up: Patient is to follow up as directed on the after visit summary instructions    Condition of patient: Stable

## 2021-05-13 NOTE — Telephone Encounter (Signed)
-----   Message from Truitt Merle, MD sent at 05/11/2021 12:01 PM PST -----  MA's - Please call and give path results -precancerous rectal polyp.  Recommend surveillance colonoscopy in 1 year.  Thanks.  -j

## 2021-05-13 NOTE — Telephone Encounter (Signed)
The results below have been printed and mailed to the patient. Included information: results, recommendations from Dr. Ogao, follow up information if needed, and our office phone number to call if they have any questions. RM

## 2021-06-22 ENCOUNTER — Ambulatory Visit

## 2021-07-21 ENCOUNTER — Inpatient Hospital Stay: Admit: 2021-07-21 | Discharge: 2021-08-17

## 2021-07-21 DIAGNOSIS — Z1231 Encounter for screening mammogram for malignant neoplasm of breast: Secondary | ICD-10-CM

## 2021-07-22 NOTE — Telephone Encounter (Signed)
Spoke with Lucile Crater from [facility] and she stated that the patient came down for a mammogram and they are now recommending a bilateral ultrasound and are wondering if the provider is able to order that.

## 2021-07-25 IMAGING — MR MRI LUMBAR SPINE W/WO CONTRAST
6 of 10 series · 22 of 48 positions shown · IV contrast (gadavist)
Comparison: Lumbar MRI October 10, 2019

________________________________________________________________________________________________ 
MRI LUMBAR SPINE W/WO CONTRAST, 07/25/2021 [DATE]: 
CLINICAL INDICATION: Low back pain extending to right leg with numbness, fell 9 
months ago, spondylosis without myelopathy or radiculopathy
TECHNIQUE: Sagittal T1, Sagittal T2, Sagittal STIR, Axial T2, Axial T1, Enhanced 
sagittal T1, Enhanced fat suppressed sagittal T1, and Enhanced axial T1 MR 
images of the lumbar spine were performed with and without intravenous 
enhancement.  6 ccs of Gadavist is injected intravenously by hand. The 
patients eGFR was calculated to be 100.0 mL/min/1.73 m2 using the i-STAT 
device.

[Series 101: survey · axial · 10.0mm · 1.39mm/px · z∈[-14,+200]mm · 3 of 9 slices shown]
[im 1/9]
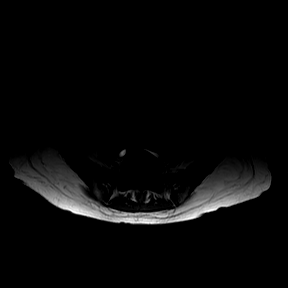
[im 5/9]
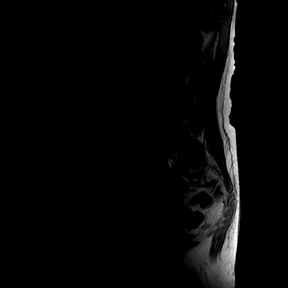
[im 9/9]
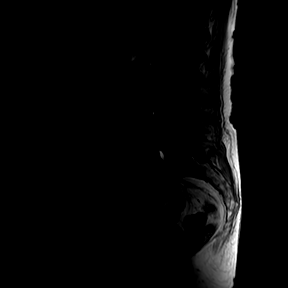

[Series 201: t2w_cor-surv · coronal · 6.0mm · 0.60mm/px · 1 of 5 slices shown]
[im 1/5]
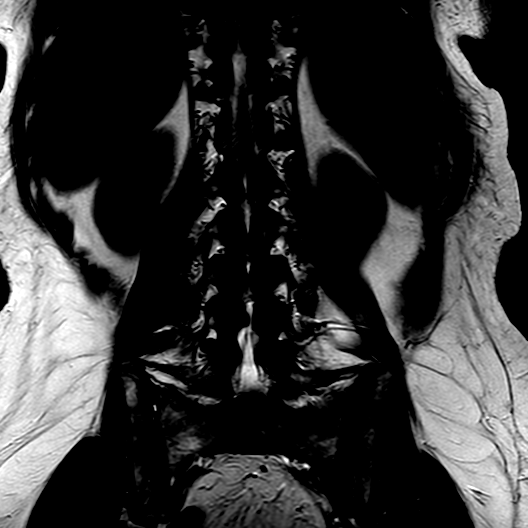

[Series 301: t2_tse_sag · sagittal · 4.0mm · 0.41mm/px · 4 of 17 slices shown]
[im 1/17]
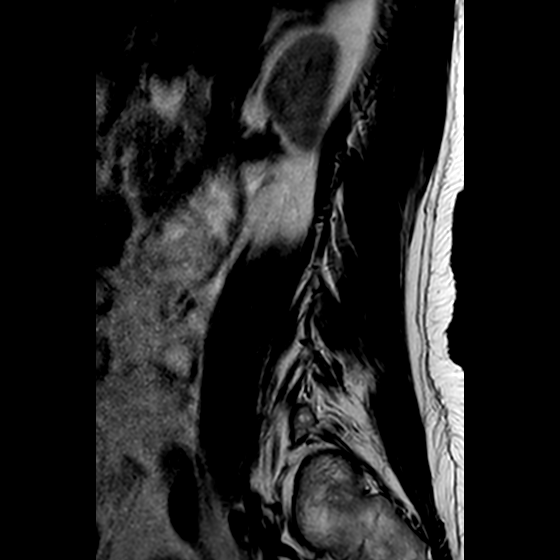
[im 6/17]
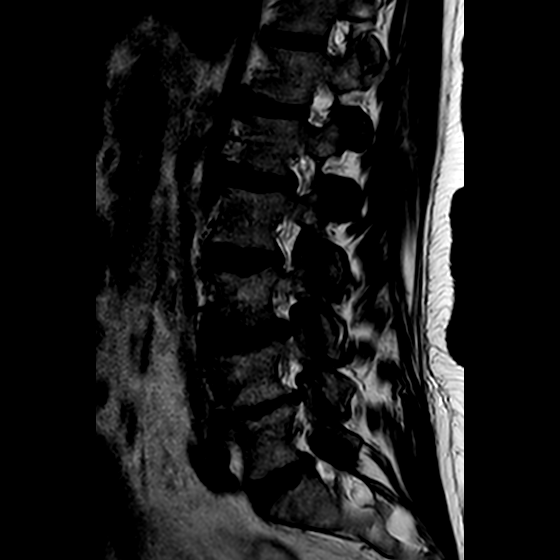
[im 11/17]
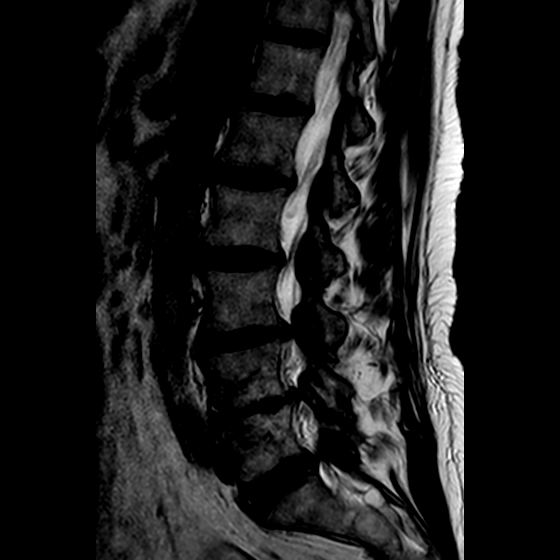
[im 17/17]
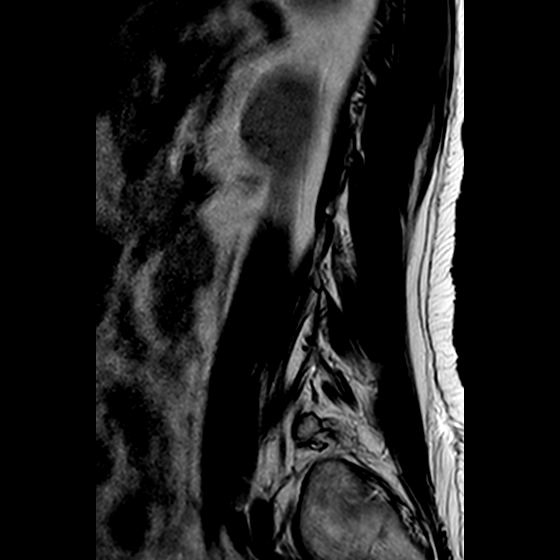

[Series 401: t1_tse_sag · sagittal · 4.0mm · 0.47mm/px · 4 of 17 slices shown]
[im 1/17]
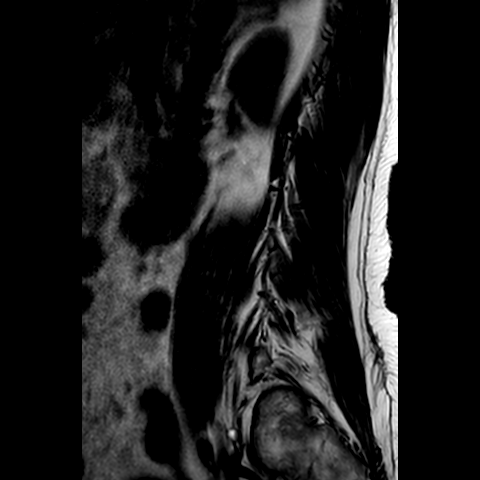
[im 6/17]
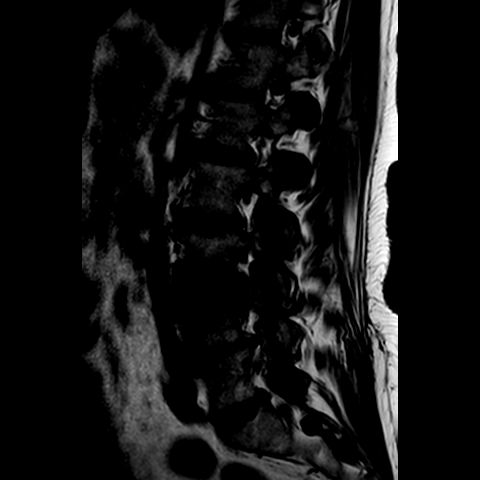
[im 11/17]
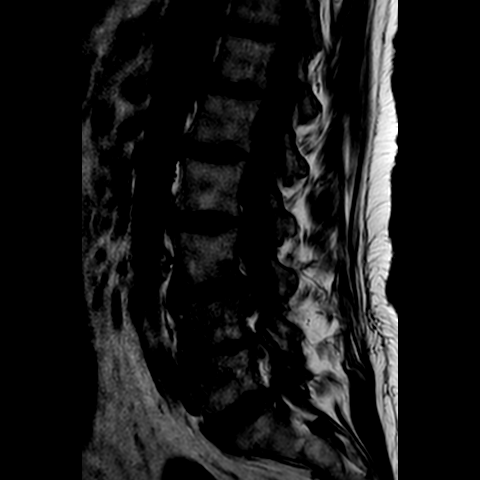
[im 17/17]
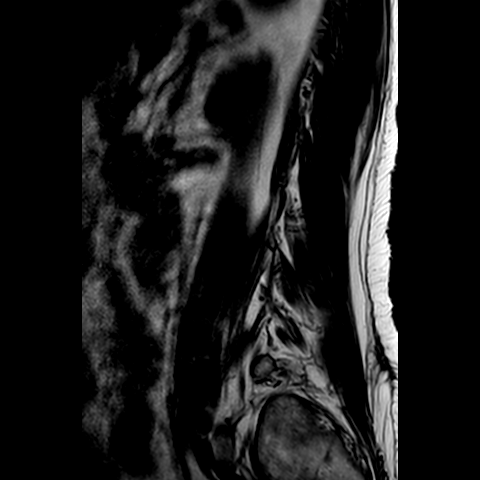

[Series 501: stir_sag · sagittal · 4.0mm · 0.57mm/px · 4 of 17 slices shown]
[im 1/17]
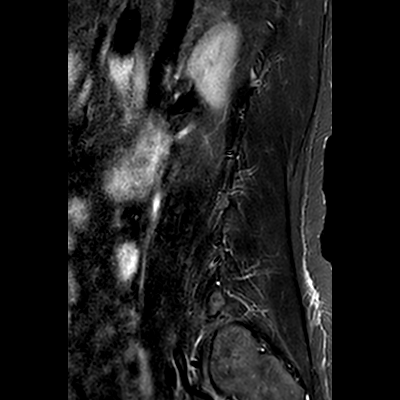
[im 6/17]
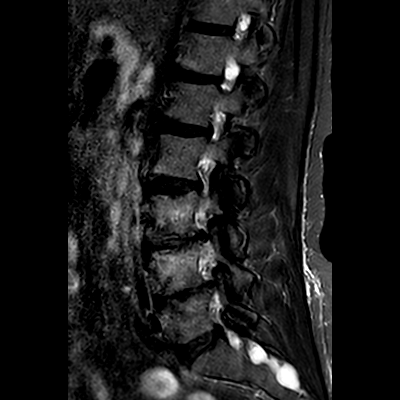
[im 11/17]
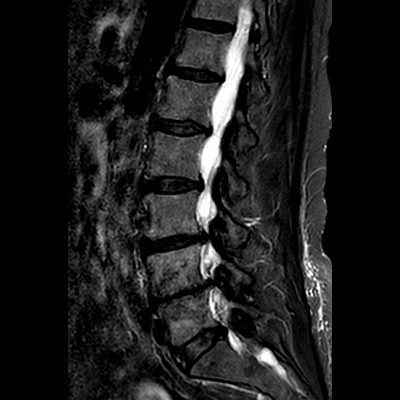
[im 17/17]
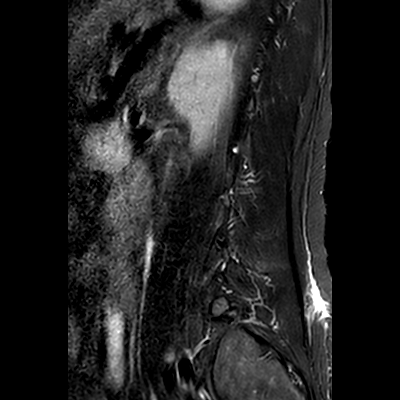

[Series 601: t2_(person_name)_(person_name)_mst · axial · 4.0mm · 0.34mm/px · z∈[-74,+75]mm · 6 of 36 slices shown]
[im 1/36]
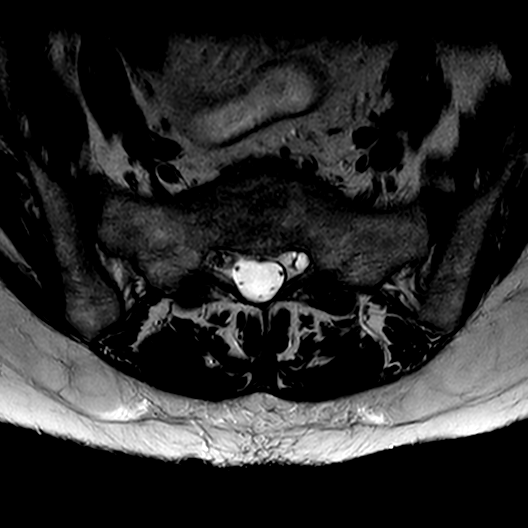
[im 6/36]
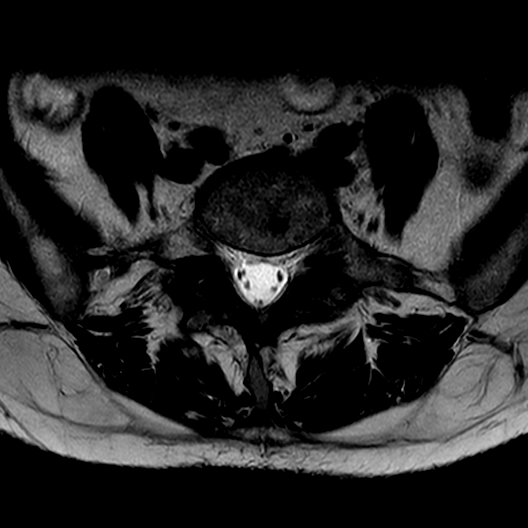
[im 11/36]
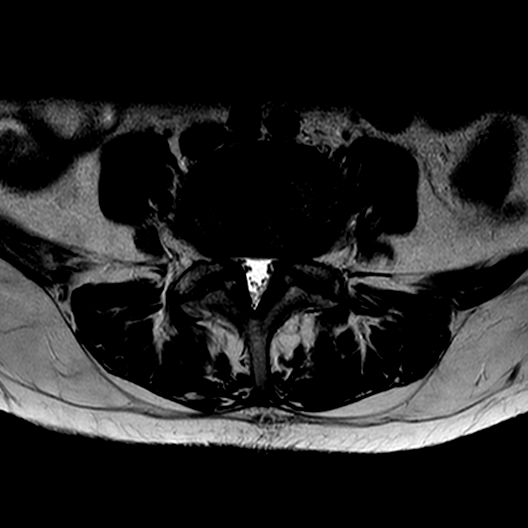
[im 16/36]
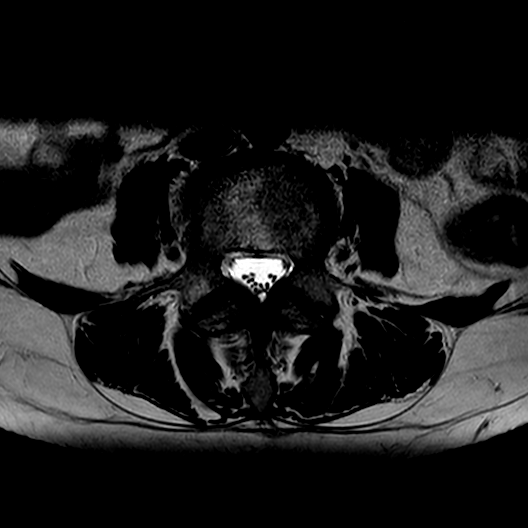
[im 21/36]
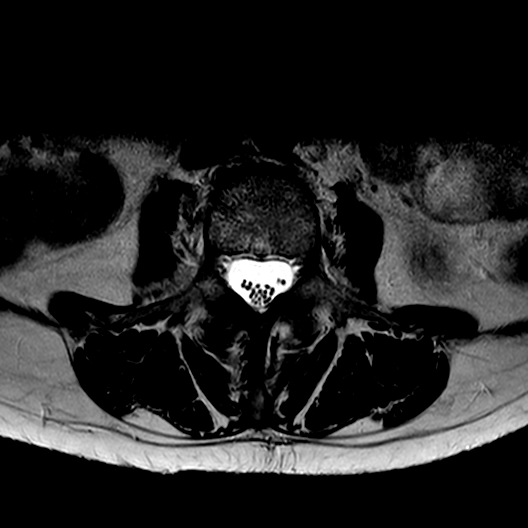
[im 26/36]
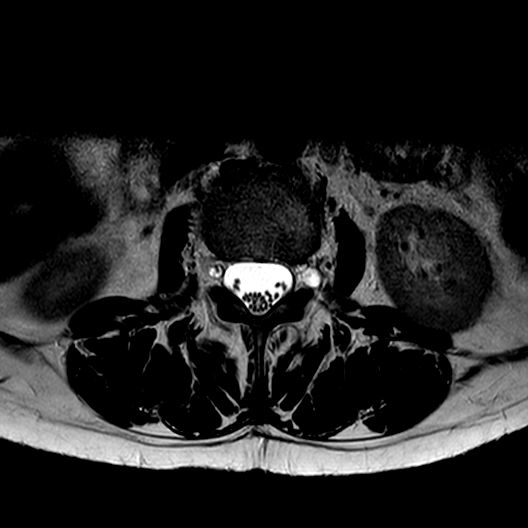

[22 of 48 positions shown; findings below may reference images not displayed]

FINDINGS: Lumbar vertebral heights are intact. There is marked disc narrowing at 
L4-5, and moderate to marked narrowing along the right L3-4 interspace. There is 
reactive endplate edema and enhancement at L3-4 which has increased compared to 
the prior study. There is 1 mm retrolisthesis of L3 on L4 which is unchanged. 
Conus is normal, terminating at T12-L1. 
There are sacral Tarlov cysts, of unlikely significance, unchanged from the 
prior study. No evidence for upper sacral fracture. No findings to indicate 
spinal malignancy. 
At L5-S1 there is mild disc bulge. The canal is open. There is moderate facet 
degenerative change. Foramina are open. 
At L4-5 there is moderate canal stenosis due to facet and ligamentous 
hypertrophy and broad-based disc bulge, unchanged. Foramina are open. 
At L3-4 there is mild canal stenosis which is unchanged. Foramina are open. 
The upper 2 lumbar levels show no significant canal or foraminal stenosis.
IMPRESSION: Moderate canal stenosis at L3-4 is unchanged. Worsening reactive endplate 
edema/Modic type I change at this level. 
Mild canal stenosis at L3-4 is unchanged. 
No evidence for fracture, spinal infection, or malignancy. 
Mild splenomegaly, vertical span 15.2 cm, grossly similar to the prior study. 
There is no evidence for retroperitoneal adenopathy. Clinical correlation 
advised. If indicated CT abdomen would be useful for further evaluation.

## 2021-07-26 MED ORDER — atorvastatin (LIPITOR) 20 mg tablet
20 | ORAL_TABLET | Freq: Every evening | ORAL | 3 refills | Status: DC
Start: 2021-07-26 — End: 2022-03-31

## 2021-07-26 MED ORDER — carvediloL (COREG) 6.25 mg tablet
6.25 | ORAL_TABLET | Freq: Two times a day (BID) | ORAL | 1 refills | 90.00000 days | Status: DC
Start: 2021-07-26 — End: 2022-01-13

## 2021-07-26 NOTE — Telephone Encounter (Signed)
Patient is requesting a refill of Atorvastatin and Carvedilol, last seen by [facility] on  10/22/2020, and no FYI restrictions.  Routed pended order to Bretta Bang, DO for review.     Future Appointments   Date Time Provider Department Center   09/22/2021  4:00 PM Lindley Magnus, MD [facility]        BP Readings from Last 3 Encounters:   05/04/21 (!) 153/95   03/18/21 149/82   10/22/20 136/83     Pulse Readings from Last 3 Encounters:   05/04/21 78   04/28/21 69   03/18/21 72       Last Labs:    LDL Calculated   Date Value Ref Range Status   02/08/2018 55 0 - 129 mg/dL Final     Triglyceride   Date Value Ref Range Status   02/08/2018 192.0 (H) <=150.0 mg/dL Final     Sodium   Date Value Ref Range Status   02/28/2021 139 135 - 145 mmol/L Final     Potassium   Date Value Ref Range Status   02/28/2021 3.68 3.50 - 5.10 mmol/L Final     AST - Aspartate Aminotransferase   Date Value Ref Range Status   02/28/2021 21 5 - 32 IU/L Final     ALT - Alanine Aminotransferase   Date Value Ref Range Status   02/28/2021 17 5 - 33 IU/L Final     BUN   Date Value Ref Range Status   02/28/2021 14 8 - 20 mg/dL Final     No results found for: HGBA1C, MICROALBUR, MALB24HUR  Glomerular Filtration Rate Estimate   Date Value Ref Range Status   03/06/2016 >60.0 >=60.0 mL/min/1.33m*2 Final     Glomerular Filtration Rate Estimate (Female)   Date Value Ref Range Status   02/28/2021 79 >=60 mL/min/1.34m*2 Final   10/04/2018 >60.0 >=60.0 mL/min/1.84m*2 Final

## 2021-07-27 NOTE — Telephone Encounter (Signed)
Hey Dr. Pamelia Hoit I called out pt imaging and they said that the [place] looked great and for the biopsy it was ordered for bilateral but they will change in on their end and then forward it back for a cosign once they know more information on what location they are going to be biopsying. Just wanted to send back to you as an FYI!

## 2021-07-27 NOTE — Telephone Encounter (Signed)
Received a call from out patient imaging stating that they did the mammo on the patient and there were concerning finding on the left breast. They are wanting to know if the provider will be willing to put in a bilateral breast ultrasound so that they can get that done and put in stating that they have permission to go forward to do a biopsy. I have pended the bilateral breast ultrasound but was not sure the best way to go around to do the permission to do the biopsy. Will forward to the provider for review.

## 2021-07-28 ENCOUNTER — Ambulatory Visit

## 2021-07-29 ENCOUNTER — Inpatient Hospital Stay: Admit: 2021-07-29 | Discharge: 2021-08-22 | Attending: MD

## 2021-07-29 DIAGNOSIS — N6325 Unspecified lump in the left breast, overlapping quadrants: Secondary | ICD-10-CM

## 2021-07-29 NOTE — Addendum Note (Signed)
Addended by: Luz Brazen on: 07/29/2021 03:47 PM     Modules accepted: Orders

## 2021-08-04 NOTE — Telephone Encounter (Signed)
Made contact with patient and scheduled as follows:    Future Appointments   Date Time Provider Department Center   08/10/2021 10:00 AM [facility] [place] [facility]   08/17/2021  9:50 AM Truitt Merle, MD [facility]   09/22/2021  4:00 PM Lindley Magnus, MD [facility]

## 2021-08-04 NOTE — Telephone Encounter (Signed)
Brandy [name] - patient will have her breast biopsy done on 3/1. Please schedule an office visit to discuss results & next steps with Dr. Oran Rein, approximately 3-7 days after biopsy. Thanks! RM

## 2021-08-10 ENCOUNTER — Inpatient Hospital Stay: Admit: 2021-08-10 | Discharge: 2021-08-30

## 2021-08-10 DIAGNOSIS — C50412 Malignant neoplasm of upper-outer quadrant of left female breast: Secondary | ICD-10-CM

## 2021-08-10 LAB — APTT: APTT: 34 Seconds (ref 24–36)

## 2021-08-10 LAB — PROTIME-INR
INR: 1 (ref 0.9–1.1)
Protime: 12.3 Seconds (ref 10.0–13.2)

## 2021-08-10 LAB — TISSUE EXAM

## 2021-08-10 LAB — PLATELET COUNT: Platelet Count: 214 10*3/??L (ref 150–405)

## 2021-08-10 MED ORDER — sodium bicarbonate 4.2 % (0.5 mEq/mL) injection 0.5 mEq
4.2 | Freq: Once | INTRAVENOUS | Status: AC
Start: 2021-08-10 — End: 2021-08-10
  Administered 2021-08-10: 18:00:00 4.2 meq via SUBCUTANEOUS

## 2021-08-10 MED ORDER — lidocaine 10 mg/mL (1 %) solution 2 mL
10 | Freq: Once | INTRAMUSCULAR | Status: AC
Start: 2021-08-10 — End: 2021-08-10
  Administered 2021-08-10: 18:00:00 10 mL via SUBCUTANEOUS

## 2021-08-10 MED ORDER — lidocaine-EPINEPHrine (XYLOCAINE W/EPI) 1 %-1:100,000 injection 10 mL
1 | Freq: Once | INTRAMUSCULAR | Status: AC
Start: 2021-08-10 — End: 2021-08-10
  Administered 2021-08-10: 18:00:00 1 mL via INTRADERMAL

## 2021-08-10 NOTE — Other (Signed)
Tolerated procedure well. SBAR report given to [employer] in [facility] via [place] text

## 2021-08-10 NOTE — Progress Notes (Signed)
Pt returned from procedure, report received.

## 2021-08-10 NOTE — Progress Notes (Signed)
Pt dsch to home, dsch instructions given with understanding.

## 2021-08-10 NOTE — Pre Sedation (Signed)
Arrived to [place] room via wheelchair

## 2021-08-10 NOTE — Progress Notes (Signed)
Pt ambulatory to the unit.

## 2021-08-10 NOTE — Pre Sedation (Signed)
Dr Breazeale holding pressure

## 2021-08-10 NOTE — Other (Signed)
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  She has an appointment scheduled today with general surgery (Dr. Oran Rein).

## 2021-08-10 NOTE — Pre Sedation (Signed)
[place] scanning at present

## 2021-08-17 ENCOUNTER — Ambulatory Visit: Admit: 2021-08-17 | Discharge: 2021-08-17 | Attending: MD

## 2021-08-17 DIAGNOSIS — C50412 Malignant neoplasm of upper-outer quadrant of left female breast: Secondary | ICD-10-CM

## 2021-08-17 NOTE — Progress Notes (Signed)
HISTORY OF PRESENT ILLNESS:    Brandy [name] is a 66 y.o. female referred by Bretta Bang, DO for surgical evaluation of a biopsy-proven left-sided breast cancer.  Patient is known to me from previous right hemicolectomy for colon cancer.  Patient underwent screening mammography a month ago which prompted further ultrasound imaging and subsequently characterized a 10 mm spiculated mass with irregular margins in the left breast at the 3 o'clock position as well as a large left axillary 4 cm lymph node.  The breast mass was biopsied and found to be invasive carcinoma.  She presents now for surgical evaluation.  Patient denies any palpable lesion to the breast.  She has some breast discomfort since the biopsy but none before.  She has a tender spot in her left axilla.  She has had no skin changes or nipple discharge.  Patient is unaware of any family history of breast cancer.      MEDICAL / SURGICAL HISTORY:  Past Medical History:   Diagnosis Date   . Arrhythmia    . CHF (congestive heart failure) (HCC)    . Colon cancer (HCC) 12/28/2016   . Colonic mass 11/28/2016    POSTOPERATIVE DIAGNOSIS 11/28/16 colonoscopy:  1) Colonoscopy with 3/4 circumference apple core lesion at full insertion of the colonoscope but likely distal to the Hepatic flexure 2) Hemorrhoids: mild internal 3) Mass: hot forceps biopsied times two without complications and tattooed times two proximal to the mass  Biopsy suspicious for intramucosal adenocarcinoma. Referral to Dr Oran Rein done and she   . Encounter for screening colonoscopy 12/05/2017   . History of blood transfusion    . HIT (heparin-induced thrombocytopenia)    . Hyperlipidemia    . Screening for colon cancer 10/04/2016    POSTOPERATIVE DIAGNOSIS 11/28/16 colonoscopy:  1) Colonoscopy with 3/4 circumference apple core lesion at full insertion of the colonoscope but likely distal to the Hepatic flexure 2) Hemorrhoids: mild internal 3) Mass: hot forceps biopsied times two without  complications and tattooed times two proximal to the mass  Biopsy suspicious for intramucosal adenocarcinoma. Referral to Dr Oran Rein done and she   . Thyroid disease     Hyperparathyroid        Past Surgical History:   Procedure Laterality Date   . CARDIAC SURGERY  01/23/2016    AVR   . COLON SURGERY  2018    Hemicolectomy   . COLONOSCOPY N/A 05/04/2021    Procedure: COLONOSCOPY; W/ POSSIBLE BIOPSY & POLYPECTOMY;  Surgeon: Truitt Merle, MD;  Location: [facility] OR;  Service: [facility] Procedures;  Laterality: N/A;   . CORONARY ANGIOPLASTY     . PROCEDURE N/A 01/27/2016    Procedure: AORTIC VALVE REPLACEMENT; INTRA AORTIC BALLOON INSERTION;  Surgeon: Otho Bellows, MD;  Location: [facility] OR;  Service: Cardiology;  Laterality: N/A;   . PROCEDURE Right 03/10/2016    Procedure: AMPUTATION TOE;  Surgeon: Chrystie Nose, DPM;  Location: [facility] OR;  Service: Podiatry;  Laterality: Right;   . PROCEDURE N/A 11/28/2016    Procedure: COLONOSCOPY; BIOPSY;  Surgeon: Gailen Shelter, MD;  Location: [facility] OR;  Service: [facility] Procedures;  Laterality: N/A;  ink spot marking apple core mass proximal lesion distal to hepatic flexure   . PROCEDURE Right 12/28/2016    Procedure: LAPAROPSCOPIC CONVERTED TO OPEN HAND ASSISTED RIGHT HEMI COLECTOMY;  Surgeon: Truitt Merle, MD;  Location: [facility] OR;  Service: [facility] Procedures;  Laterality: Right;  Dr. Oran Rein wants 3 hours   . PROCEDURE N/A 01/15/2018  Procedure: COLONOSCOPY; W/ POSSIBLE BIOPSY & POLYPECTOMY;  Surgeon: Truitt Merle, MD;  Location: [facility] OR;  Service: [facility] Procedures;  Laterality: N/A;   . PROCEDURE N/A 03/19/2018    Procedure: PARATHYROIDECTOMY; RECURRENT LARYNGEAL NERVE MONITORING, INTRAOPERATIVE PARATHYROID HORMONE;  Surgeon: Edmund Hilda, MD;  Location: [facility] OR;  Service: Ear Nose and Throat;  Laterality: N/A;   . TONSILLECTOMY     . TUBAL LIGATION  1980        CURRENT MEDS:  Current Outpatient Medications   Medication Sig Dispense Refill   . acetaminophen (TYLENOL) 500 MG tablet Take 1 tablet by mouth every  6 hours as needed for Pain.     Marland Kitchen aspirin 81 MG EC tablet Take 1 tablet by mouth every morning.     Marland Kitchen atorvastatin (LIPITOR) 20 mg tablet Take 1 tablet by mouth every night at bedtime. 90 tablet 3   . CALCIUM ORAL Take 1 tablet by mouth every morning.     . carvediloL (COREG) 6.25 mg tablet Take 1 tablet by mouth 2 times daily with meals. 180 tablet 1   . cholecalciferol, vitamin D3, (VITAMIN D3 ORAL) Take 1 tablet by mouth every morning.     Marland Kitchen EPINEPHrine (EPIPEN) 0.3 mg/0.3 mL AtIn injection Inject 0.3 mL into the muscle once for 1 dose. 0.3 mL 0   . ibuprofen (ADVIL) 200 MG tablet Take 1 tablet by mouth 2 times daily as needed (pain).       No current facility-administered medications for this visit.        ALLERGIES:  Heparin and Sulfa (sulfonamide antibiotics)      Family History   Problem Relation Age of Onset   . Colon cancer Mother         dx at older age   . Heart disease Father    . Heart disease Sister    . Cancer Brother    . Heart disease Maternal Aunt    . Ovarian cancer Maternal Aunt    . Cancer Maternal Uncle    . Heart disease Maternal Uncle    . Breast cancer Paternal Aunt         Social History     Socioeconomic History   . Marital status: Married   Tobacco Use   . Smoking status: Never     Passive exposure: Yes   . Smokeless tobacco: Never   . Tobacco comments:     SBIRT 02/13/2018 [facility] Father smoked in the home.    Vaping Use   . Vaping Use: Never used   Substance and Sexual Activity   . Alcohol use: No   . Drug use: No   . Sexual activity: Not Currently     Partners: Male        Review of Systems  Systems reviewed, pertinent positives and negatives discussed in HPI.        PHYSICAL EXAM  Vitals:    08/17/21 0946   BP: 142/78   BP Location: Left arm   Patient Position: Sitting   Pulse: 69   Resp: 17   Temp: 36.9 ?C (98.5 ?F)   TempSrc: Temporal   SpO2: 97%   Weight: 263 lb 3.2 oz (119.4 kg)      Body mass index is 46.62 kg/m?Marland Kitchen    General: no acute distress, alert and oriented x3   HEENT: no  scleral icterus   Abdomen: soft, non tender, obese  Musculoskeletal/extremities: well perfused bilaterally  Neurologic: no focal deficits.  Breasts:  Patient has large breasts without any distinct mass or skin changes.  The biopsy site at the left lateral breast is mildly tender.  The left axilla has a large palpable fixed mass.    LABS: Reviewed    Lab Results   Component Value Date    WHITEBLOODCE 5.3 02/28/2021    HGB 12.8 02/28/2021    HCT 38.4 02/28/2021    MCV 91.5 02/28/2021    LABPLAT 214 08/10/2021     Lab Results   Component Value Date    NA 139 02/28/2021    K 3.68 02/28/2021    CL 102.3 02/28/2021    CO2 25 02/28/2021    BUN 14 02/28/2021    CREATININES 0.79 02/28/2021    BCR 17.7 02/28/2021    GLU 84 02/28/2021    CALCIUM 9.6 02/28/2021    AST 21 02/28/2021    ALT 17 02/28/2021    ALKPHOS 106 (H) 02/28/2021    PROT 7.7 02/28/2021    ALBUMIN 4.3 02/28/2021    BILITOT 0.50 02/28/2021    ANIONGAP 11.7 02/28/2021    LABGLOM 79 02/28/2021    GFRADDINFO  02/28/2021      Comment:      For additional information, please visit the National Kidney Disease Education Program (NKDEP) at [email or web address]    OSMOLALITY 277.2 02/28/2021    AGRATIO 1.3 02/28/2021       IMAGING: Imaging reviewed in the PACS system, agree with interpretation as reported    EXAMINATION:  US BREAST COMPLETE BILATERAL  ?  HISTORY:  abnormal mammogram  ?  COMPARISON:  01/23/2019, mammogram 07/21/2021  ?  TECHNIQUE:  A breast ultrasound was performed, evaluating specific areas of concern.  The breast(s) was scanned in entirety.  ?  FINDINGS:  BREAST:  Irregular 10 mm spiculated mass with irregular margins and posterior shadowing in the left breast at the 3 o'clock position.  SIMPLE CYSTS:  None seen.  LYMPH NODES: Enlarged lymph node with thickened cortex in the left axilla, measuring 4 cm.  OTHER:  None.  IMPLANTS:  None seen.  ?  ASSESSMENT:  CATEGORY: BIRADS 5 - Highly Suggestive of Malignancy.  Irregular mass in the left lateral breast  with enlarged lymph node in the left axilla.      Final Pathologic Diagnosis  A. Left breast, biopsy:  Procedure: Needle biopsy.  Specimen Laterality: Left.  Tumor:  Tumor Site: 2 o'clock (Per [place] report 08/10/2021).  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  Histologic Grade (Nottingham Histologic Score):  Glandular (Acinar)/Tubular Differentiation: Score 3.  Nuclear Pleomorphism: Score 3.  Mitotic Rate: Score 3.  Overall Score: Grade 3 (Score of 9).  Tumor Size: 3.  Ductal Carcinoma in Situ: Present.  Architectural Patterns: Solid.  Nuclear Grade: Grade III (high).  Necrosis: Not identified.  Lymphovascular Invasion: Not identified.  Microcalcifications: Present in invasive carcinoma.    Microscopic Description  The following tests were ordered by Dr. Deland Pretty on 08/12/2021 at 1425.  IHC stain(s): Calponin on Block(s) A1 - Negative staining for infiltrative cells. Positive, strong intensity +3  cytoplasmic myoepithelial cell staining for DCIS.  IHC stain(s): P63 on Block(s) A1 - Negative staining for infiltrative cells. Positive, strong intensity +3 nuclear  myoepithelial cell staining for DCIS.    BREAST BIOMARKER RESULTS FOR INVASIVE BREAST CARCINOMA (BLOCK A1)  Technical Services ([facility]) and Horticulturist, commercial ([facility])  Performed at [facility], [address] [place], [place] [address]  Page: 1 of 3  [place], [place] [id number]  Estrogen Receptor (ER) Status  POSITIVE (>10% of cells demonstrate nuclear positivity)  Percent of cells with nuclear positivity: 99%  Average intensity of staining: STRONG  Progesterone Receptor (PR) Status  POSITIVE (>1% of cells demonstrate nuclear positivity)  Percent of cells with nuclear positivity: 95%  Average intensity of staining: STRONG  HER2 (by Immunohistochemistry)  - POSITIVE (Score 3+)  Percentage of cells with uniform intense complete membrane staining: 50%  Ki-67  Percentage of cells with nuclear positivity: 70%,  high.  P53  Percentage of positive cells: 70%, positive.      ASSESSMENT & PLAN      ICD-10-CM    1. Malignant neoplasm of lower-outer quadrant of left breast of female, estrogen receptor positive (HCC)  C50.512 Referral to Hematology/Oncology ([facility])    Z17.0 Ultrasound-guided breast biopsy left     Tissue Exam (Surgical Pathology) -Routine          Patient has a clinical stage III left breast cancer, ER/PR positive, HER2 positive which may benefit from neoadjuvant chemotherapy.  Ultrasound-guided needle biopsy of the enlarged left axillary lymph node will be requested along with consultation with medical oncology to discuss neoadjuvant approaches.  In anticipation of patient need for vascular access for administration of scheduled infusion therapy, I discussed the utility, risks, benefits, and alternatives of a right subclavian tunneled central venous catheter with subcutaneous port placement. The procedure was described in detail and post operative expectations were reviewed.  A detailed PARQ conference was completed. Will schedule for next available.  I briefly touched on options for surgical management after neoadjuvant treatments including breast conservation versus mastectomy.  All questions were answered.  Thank you for allowing me to participate in the care of this patient.

## 2021-08-17 NOTE — Progress Notes (Signed)
Brandy [name] is a 65 y.o. female established patient here today to discuss recent breast pathology results & next steps. She states she also had a mag seed placed at the same time. RM    BP 142/78 (BP Location: Left arm, Patient Position: Sitting)   Pulse 69   Temp 36.9 ?C (98.5 ?F) (Temporal)   Resp 17   Wt 263 lb 3.2 oz (119.4 kg)   LMP  (LMP Unknown)   SpO2 97% Comment: Room  BMI 46.62 kg/m?     Ambulatory Nursing Form

## 2021-08-19 ENCOUNTER — Ambulatory Visit: Admit: 2021-08-19 | Discharge: 2021-08-19 | Attending: Medical Oncology

## 2021-08-19 DIAGNOSIS — C50512 Malignant neoplasm of lower-outer quadrant of left female breast: Secondary | ICD-10-CM

## 2021-08-19 MED ORDER — letrozole (FEMARA) 2.5 mg tablet
2.5 | ORAL_TABLET | Freq: Every day | ORAL | 3 refills | 30.00000 days | Status: DC
Start: 2021-08-19 — End: 2021-11-23

## 2021-08-19 NOTE — Patient Instructions (Addendum)
Instructions and Staff Orders :     Lab studies : cbc, cmp, CA 15-3     Imaging : Orders Placed This Encounter      NM PET CT whole body    Referral:  Genetic testing for h/o breast and colon ca   GYN Referral : Endometrial thickening by [place] Nov 2022 , h/o colon and breast cancer    Medications/Rx : Signed Prescriptions:                        Disp   Refills    letrozole (FEMARA) 2.5 mg tablet           30 tab*3        Sig: Take 1 tablet by mouth once daily.  Authorizing Provider: Lindley Magnus    Follow up : 2-3 weeks ( post axillary lymph node biopsy )

## 2021-08-19 NOTE — Progress Notes (Signed)
The patient stated that she has a burning feeling at the biopsy site but she said it must be heeling because its starting to get itchy. She said she does not have any other symptoms.

## 2021-08-19 NOTE — Progress Notes (Signed)
[facility]    NAME:  Brandy [name]  MRN:  [id number]  DOB:  09/01/1955    PROGRESS NOTE      REASON FOR FOLLOW-UP:     This is a 66 y.o. female who presents today for a follow-up regarding Malignant neoplasm of colon, unspecified part of colon (HCC) [C18.9] .    MEDICAL ONCOLOGY HISTORY:  09/08/2020 - per Brandy [name] note    [id number] woman with history of stage 2 colon cancer.  ?  The patient was diagnosed with an adenocarcinoma of the colon in 11/2016 and she underwent resection 12/28/16.  Path showed pT3N0 tumor:  Grade 1, no LVI, no perineural invasion, 0/29 LNs, no perforation and negative margins.  ?  Given no high risk features, no adjuvant chemotherapy was recommended and she has been under surveillance since that time.  ?  Of note, the patient also has a history of an aortic valve replacement in 2017 and she states she has had some   shortness of breath since that time.  She is followed by cardiology.     INTERVAL HISTORY :  08/19/2021     Patient presents today for scheduled follow-up  In the interim since her last visit in September 2022, she has been diagnosed with left breast invasive ductal carcinoma following routine mammogramIn early February 2023.Marland Kitchen  She has also been found to have coughs old size left axillary mass biopsy of which she is awaiting.  She has been seen by Brandy [name] general surgery who has explained to her the rationale for obtaining left axillary ultrasound-guided biopsy and consideration of neoadjuvant chemotherapy followed by definitive surgery.    She otherwise describes having unchanged shortness of breath and exertional intolerance since after her heart valve surgery in 2017.  Otherwise denies any changes in bowel habits or melena hematochezia.  Last colonoscopy was in November 2022 with findings of a rectal polyp consistent with serrated adenoma that was avulsed.    She has not had any since recent weight changes or night sweats or pain.  She reports that her step 38 year old daughter  died in a short while following diagnosis of metastatic cancer of unknown primary soon after Thanksgiving 2022.      PAST MEDICAL HISTORY:    Past Medical History:   Diagnosis Date   . Arrhythmia    . CHF (congestive heart failure) (HCC)    . Colon cancer (HCC) 12/28/2016   . Colonic mass 11/28/2016    POSTOPERATIVE DIAGNOSIS 11/28/16 colonoscopy:  1) Colonoscopy with 3/4 circumference apple core lesion at full insertion of the colonoscope but likely distal to the Hepatic flexure 2) Hemorrhoids: mild internal 3) Mass: hot forceps biopsied times two without complications and tattooed times two proximal to the mass  Biopsy suspicious for intramucosal adenocarcinoma. Referral to Dr Oran [name] done and she   . Encounter for screening colonoscopy 12/05/2017   . History of blood transfusion    . HIT (heparin-induced thrombocytopenia)    . Hyperlipidemia    . Screening for colon cancer 10/04/2016    POSTOPERATIVE DIAGNOSIS 11/28/16 colonoscopy:  1) Colonoscopy with 3/4 circumference apple core lesion at full insertion of the colonoscope but likely distal to the Hepatic flexure 2) Hemorrhoids: mild internal 3) Mass: hot forceps biopsied times two without complications and tattooed times two proximal to the mass  Biopsy suspicious for intramucosal adenocarcinoma. Referral to Dr Oran [name] done and she   . Thyroid disease     Hyperparathyroid  CURRENT MEDICATIONS AND ALLERGIES:      Current Outpatient Medications:   .  acetaminophen (TYLENOL) 500 MG tablet, Take 1 tablet by mouth every 6 hours as needed for Pain. (Patient not taking: Reported on 08/19/2021), Disp: , Rfl:   .  aspirin 81 MG EC tablet, Take 1 tablet by mouth every morning. (Patient not taking: Reported on 08/19/2021), Disp: , Rfl:   .  atorvastatin (LIPITOR) 20 mg tablet, Take 1 tablet by mouth every night at bedtime., Disp: 90 tablet, Rfl: 3  .  CALCIUM ORAL, Take 1 tablet by mouth every morning., Disp: , Rfl:   .  carvediloL (COREG) 6.25 mg tablet, Take 1 tablet by  mouth 2 times daily with meals., Disp: 180 tablet, Rfl: 1  .  cholecalciferol, vitamin D3, (VITAMIN D3 ORAL), Take 1 tablet by mouth every morning., Disp: , Rfl:   .  EPINEPHrine (EPIPEN) 0.3 mg/0.3 mL AtIn injection, Inject 0.3 mL into the muscle once for 1 dose., Disp: 0.3 mL, Rfl: 0  .  ibuprofen (ADVIL) 200 MG tablet, Take 1 tablet by mouth 2 times daily as needed (pain). (Patient not taking: Reported on 08/19/2021), Disp: , Rfl:     Allergies   Allergen Reactions   . Heparin Anaphylaxis     Possible HIT   . Sulfa (Sulfonamide Antibiotics) Rash     In ICU for 3 days post dose         PATIENT HISTORY:    Social History     Tobacco Use   . Smoking status: Never     Passive exposure: Yes   . Smokeless tobacco: Never   . Tobacco comments:     SBIRT 02/13/2018 [facility] Father smoked in the home.    Vaping Use   . Vaping Use: Never used   Substance Use Topics   . Alcohol use: No   . Drug use: No       Family History   Problem Relation Age of Onset   . Colon cancer Mother         dx at older age   . Heart disease Father    . Heart disease Sister    . Cancer Brother    . Heart disease Maternal Aunt    . Ovarian cancer Maternal Aunt    . Cancer Maternal Uncle    . Heart disease Maternal Uncle    . Breast cancer Paternal Aunt        REVIEW OF SYSTEMS:      Review of Systems   Constitutional: Negative.    HENT:  Negative.    Eyes: Negative.    Respiratory: Positive for shortness of breath.         Since before her heart surgery - unchanged   Cardiovascular: Negative.    Gastrointestinal: Negative.    Endocrine: Negative.    Genitourinary: Negative.     Musculoskeletal: Negative.    Skin: Negative.    Neurological: Negative.    Hematological: Negative.    Psychiatric/Behavioral: Negative.          PHYSICAL EXAM:    Vitals:    08/19/21 1306   BP: 143/90   BP Location: Right forearm   Pulse: 73   Temp: 36.3 ?C (97.4 ?F)   SpO2: 96%   Weight: 266 lb 1.6 oz (120.7 kg)   Height: 5' 3 (1.6 m)        Physical Exam  Vitals reviewed.    Constitutional:  Appearance: Normal appearance.      Comments: obese   HENT:      Head: Normocephalic and atraumatic.      Mouth/Throat:      Mouth: Mucous membranes are moist.      Pharynx: Oropharynx is clear.   Eyes:      Extraocular Movements: Extraocular movements intact.   Cardiovascular:      Rate and Rhythm: Normal rate and regular rhythm.      Pulses: Normal pulses.      Heart sounds: Normal heart sounds.   Pulmonary:      Effort: Pulmonary effort is normal.      Breath sounds: Normal breath sounds.      Comments: Left breast biopsy site + outer lower breast quadrant  No palp mass or nipple abnormalities of  either breast   Left axillary LN palpable  size of golf ball )  Abdominal:      General: Abdomen is flat. Bowel sounds are normal.      Palpations: Abdomen is soft.      Comments: No hepatosplenomegaly, obese     Musculoskeletal:         General: Normal range of motion.      Cervical back: Normal range of motion and neck supple.   Skin:     General: Skin is warm and dry.      Capillary Refill: Capillary refill takes less than 2 seconds.   Neurological:      General: No focal deficit present.      Mental Status: She is alert and oriented to person, place, and time.   Psychiatric:         Mood and Affect: Mood normal.         Behavior: Behavior normal.         Thought Content: Thought content normal.         Judgment: Judgment normal.     LABS:    Lab Results   Component Value Date    WHITEBLOODCE 5.3 02/28/2021    HGB 12.8 02/28/2021    HCT 38.4 02/28/2021    MCV 91.5 02/28/2021    LABPLAT 214 08/10/2021        Lab Results   Component Value Date    NA 139 02/28/2021    K 3.68 02/28/2021    CL 102.3 02/28/2021    CO2 25 02/28/2021    BUN 14 02/28/2021    CREATININES 0.79 02/28/2021    BCR 17.7 02/28/2021    GLU 84 02/28/2021    CALCIUM 9.6 02/28/2021    AST 21 02/28/2021    ALT 17 02/28/2021    ALKPHOS 106 (H) 02/28/2021    PROT 7.7 02/28/2021    ALBUMIN 4.3 02/28/2021    BILITOT 0.50 02/28/2021     ANIONGAP 11.7 02/28/2021    LABGLOM 79 02/28/2021    GFRADDINFO  02/28/2021      Comment:      For additional information, please visit the National Kidney Disease Education Program (NKDEP) at [email or web address]    OSMOLALITY 277.2 02/28/2021    AGRATIO 1.3 02/28/2021     No results found for: CA125      IMAGING:  Ultrasound non-ob transvaginal: 04/18/2021 :   IMPRESSION:  1. Significantly abnormally thickened endometrium with increased blood flow.  Colonic logical referral is recommended in this postmenopausal female.  2. Likely fibroid within the uterus measuring up to 19 mm.  3. Nonvisualization of either focal ovary.  Mammography tomo screening bilateral digital : 07/21/2021 : FINDINGS:  PARENCHYMAL TISSUE: Breast tissue density as noted above.  MASSES: 1.  There is a new mass in the left mid breast outer lower quadrant measuring 11 mm.  This is associated with some calcifications and has architectural distortion.  There is an adjacent possible satellite lesion measuring 7 mm.  Both findings are   best seen cc 18/67 MLO 31/81.  Diagnostic ultrasound is recommended for further evaluation and to determine if ultrasound-guided biopsy may be performed.    CT chest abdomen pelvis with IV contrast 03/11/2021    Narrative  EXAMINATION:  CT CHEST ABDOMEN PELVIS W IV CONTRAST:   CHEST:  PLEURA:  No effusion or pneumothorax.  LUNGS/AIRWAYS: Chronic areas of mild bibasilar interstitial thickening and patchy areas of ground-glass, similar to prior and likely due to chronic inflammatory process.  Unchanged focal areas of chronic nodularity/scarring in the posterior lingula again  measuring approximately 1.5 cm and 1.1 cm, respectively (axial 48, 54).  Mild bilateral dependent atelectasis.  Chronic moderately elevated right hemidiaphragm.  MEDIASTINUM: Aortic valve replacement.  Retained epicardial pacing leads again noted.  Borderline cardiomegaly.  No pericardial effusion.  LYMPH NODES:  No enlarged lymph  nodes.  ABDOMEN/PELVIS:  HEPATOBILIARY: No concerning liver lesions. Unchanged focal fat along the falciform ligament.  Unchanged small calcified stone/polyp again noted in the fundus of the otherwise unremarkable gallbladder.  No biliary ductal dilatation.  PANCREAS: Unremarkable.  SPLEEN: Lobulated.   No splenomegaly. Punctate calcified granuloma again noted.  ADRENALS: No nodules.  KIDNEYS: Punctate nonobstructing left upper pole renal calculus.  No hydronephrosis.  Partially duplicated left ureter.  No enhancing renal mass.  PELVIC ORGANS: The endometrium is not well-visualized but appears thickened with a possible polypoid lesion at the fundus.  Pelvic organs otherwise unremarkable.  BOWEL/PERITONEUM/RETROPERITONEUM: Post partial colectomy.  No obstruction.  Stable small hiatal hernia.  Colonic diverticulosis without diverticulitis.   No intraperitoneal free air or fluid collection.  LYMPH NODES: No enlarged lymph nodes in the abdomen or pelvis.  VESSELS: No abdominal aortic aneurysm.  BONES/SOFT TISSUES:  No concerning osseous lesions.  S-shaped thoracic scoliosis again noted.  Post median sternotomy.  Moderately-sized, fat-containing subxiphoid ventral hernia.  Small fat-containing periumbilical hernia  IMPRESSION:  -No evidence of metastatic disease in the chest, abdomen, or pelvis.  -The endometrium is not well-visualized but appears thickened with a possible polypoid lesion at the fundus.  Recommend further evaluation with dedicated pelvic ultrasound.  -Chronic areas of mild bibasilar interstitial thickening and patchy areas of ground-glass, similar to prior and likely due to chronic inflammatory process.  -Additional chronic/incidental findings as detailed above.            ASSESSMENT:  Encounter Diagnoses   Name Primary?   . Malignant neoplasm of left breast in female, estrogen receptor positive, unspecified site of breast (HCC) Yes   . Malignant neoplasm of colon, unspecified part of colon (HCC)    .  Malignant neoplasm of axillary tail of left breast in female, estrogen receptor positive (HCC)    . Axillary adenopathy    . Malignant neoplasm of hepatic flexure (HCC)    . Thickened endometrium        This is a 66 y.o. female who has been followed in our clinic for history of pT3 N0 m0, stage II, adenocarcinoma.  S/P right-sided hemicolectomy 12/2016, no high risk features and therefore no adjuvant systemic therapy.  Patient is now on surveillance.    She is  now newly diagnosed with left breast invasive ductal carcinoma, biopsy-proven on 08/10/2021 with 2 foci 11 mm and 7 mm each by mammogram and ultrasonographic results.  Pathology of the infiltrating ductal carcinoma is consistent with a ER strongly positive, PR strongly positive and HER2/neu 3+ by immunohistochemistry, grade 3, Ki-67 70% high.    She has a 2 x 2 x 2 inches palpable axillary lymph node, likely metastatic from breast primary.  She is awaiting ultrasound-guided biopsy of this axillary node.      Had an extensive discussion with patient and her husband regarding approach to treatment for breast cancer with metastases to axillary lymph nodes.  Explained that we will wait for confirmation of metastases to the axilla versus second primary, although unlikely.  Will need consideration for neoadjuvant chemotherapy if the axillary lymphadenopathy is consistent with breast metastases.    Explained prognostic significance of molecular markers on breast cancer including estrogen receptor status, progesterone receptor status, HER2/neu expression.  Patient is ER strongly positive PR strongly positive and HER2/neu positive by immunohistochemistry.    Explained that she would be a candidate for neoadjuvant chemotherapy with Taxotere plus carboplatin plus Herceptin plus Perjeta followed by surgery and adjuvant radiation therapy (due to nodal metastases) to be followed by total of 52 weeks of Herceptin and Perjeta.    She is aware that she would need a Port-A-Cath  placement for venous access for chemotherapy administration. have discussed with the patient the risks of mediport placement including bleeding, infection, damage to surrounding structures, as well as the procedure specific risks of carotid injury, pneumothorax, rotation of the port, deep vein thrombosis, and repeat procedures. Benefits of durable IV access such as limiting peripheral venous sclerosis, avoidance of multiple IV attempts, and limiting needle sticks for blood draws were discussed. We discussed alternatives such as repeated IV access or a peripherally inserted central line and their increased risks of pain and infection    Side effects related to taxotere and carboplatin shared including myelosuppression , ie neutropenia and increased propensity for infection, anemia and fatigue, rarely thrombocytopenia and bleeding., hypersensitivity reaction to infusion; taxane related myalgias, neuropathy, fluid retention,proarrythmic risk.  Side effects related to anti-HER2 therapy with Herceptin and Perjeta were shared as well including fatigue, joint pain, diarrhea, injection site reaction, upper respiratory tract infection, rash, muscle pain, nausea, headache, swelling, flushing, fever, cough, and pain in extremity.    Will get baseline ECHO for LVEF assessment.    Have also shared with her rationale for neoadjuvant hormone blockade therapy in the setting of ER/PR positive disease while she is awaiting confirmation of nodal metastases in the axilla.    instructions and Staff Orders :     Lab studies : cbc, cmp, CA 15-3     Imaging : Orders Placed This Encounter      NM PET CT whole body    Referral:  Genetic testing for h/o breast and colon ca   GYN Referral : Endometrial thickening by [place] Nov 2022 , h/o colon and breast cancer    Medications/Rx : Signed Prescriptions:                        Disp   Refills    letrozole (FEMARA) 2.5 mg tablet           30 tab*3        Sig: Take 1 tablet by mouth once  daily.  Authorizing Provider: Lindley Magnus    Follow up :  2-3 weeks ( post axillary lymph node biopsy )     All questions were answered to patient's satisfaction.  I have spent a total of  60  minutes on this patient's care today.  This time includes face-to-face time with the patient as well as time spent reviewing patient records, coordinating/communicating with care teams and documenting the patient's visit.    Lindley Magnus, MD      [facility]  [address]  [place], [address]  [phone]    We retain the right to modify this information without notice in the event of errors. Occasional errors in punctuation, grammar, and content occur. The patient has been advised to follow up with appointments. Patient has been informed that non-compliance with this recommendation could result in serious consequences because of delays in diagnosis and treatment. It also has been disclosed to the patient that we expect patients to take an active role in their health care and never assume that no news is good news.

## 2021-08-23 NOTE — Progress Notes (Signed)
PET was ordered as whole body and needs to be skull to thigh. New order placed.

## 2021-08-23 NOTE — Progress Notes (Signed)
Ref GYN

## 2021-08-23 NOTE — Progress Notes (Signed)
CA 15-3

## 2021-08-25 ENCOUNTER — Ambulatory Visit: Admit: 2021-08-25 | Discharge: 2021-08-25 | Attending: FNP

## 2021-08-25 ENCOUNTER — Other Ambulatory Visit: Admit: 2021-08-26

## 2021-08-25 DIAGNOSIS — Z124 Encounter for screening for malignant neoplasm of cervix: Secondary | ICD-10-CM

## 2021-08-25 DIAGNOSIS — R9389 Abnormal findings on diagnostic imaging of other specified body structures: Secondary | ICD-10-CM

## 2021-08-25 NOTE — Progress Notes (Signed)
Pt is here today to establish care and for EMB.

## 2021-08-25 NOTE — Progress Notes (Addendum)
Chief Complaint   Patient presents with   . Other     Thickened endometrium     HPI:  Brandy [name] is a 66 y.o. female who presents to the clinic for the evaluation of Other (Thickened endometrium)     Brandy [name]  She is G3P3000.  Menopause at age 40.  Denies vaginal bleeding.  1-2 years ago she had bleeding from her rectum.   She has history of Breast Cancer, Colon Cancer.  Had a pelvic ultrasound 04/18/2021 which found thickened endometrial lining at 25 mm.    She has had her large intestine removed for Cancer.  Mammogram found breast cancer in her left breast and under left axilla.    Full body CT scan showed large nodule in left breast.    When was your last PAP? 2018  Have you had any abnormal PAP's? Normal.    Is there any history of breast or ovarian cancer in the family? Mother had uterine, colon cancer that she passed from at age 97.    Past Medical History:   Diagnosis Date   . Arrhythmia    . CHF (congestive heart failure) (HCC)    . Colon cancer (HCC) 12/28/2016   . Colonic mass 11/28/2016    POSTOPERATIVE DIAGNOSIS 11/28/16 colonoscopy:  1) Colonoscopy with 3/4 circumference apple core lesion at full insertion of the colonoscope but likely distal to the Hepatic flexure 2) Hemorrhoids: mild internal 3) Mass: hot forceps biopsied times two without complications and tattooed times two proximal to the mass  Biopsy suspicious for intramucosal adenocarcinoma. Referral to Dr Oran Rein done and she   . Encounter for screening colonoscopy 12/05/2017   . History of blood transfusion    . HIT (heparin-induced thrombocytopenia)    . Hyperlipidemia    . 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 Screening for colon cancer 10/04/2016    POSTOPERATIVE DIAGNOSIS 11/28/16 colonoscopy:  1) Colonoscopy with 3/4 circumference apple core lesion at full insertion of the colonoscope but likely distal to the Hepatic flexure 2) Hemorrhoids: mild internal 3)  Mass: hot forceps biopsied times two without complications and tattooed times two proximal to the mass  Biopsy suspicious for intramucosal adenocarcinoma. Referral to Dr Oran Rein done and she   . Thyroid disease     Hyperparathyroid       Past Surgical History:   Procedure Laterality Date   . CARDIAC SURGERY  01/23/2016    AVR   . COLON SURGERY  2018    Hemicolectomy   . COLONOSCOPY N/A 05/04/2021    Procedure: COLONOSCOPY; W/ POSSIBLE BIOPSY & POLYPECTOMY;  Surgeon: Truitt Merle, MD;  Location: [facility] OR;  Service: [facility] Procedures;  Laterality: N/A;   . CORONARY ANGIOPLASTY     . PROCEDURE N/A 01/27/2016    Procedure: AORTIC VALVE REPLACEMENT; INTRA AORTIC BALLOON INSERTION;  Surgeon: Otho Bellows, MD;  Location: [facility] OR;  Service: Cardiology;  Laterality: N/A;   . PROCEDURE Right 03/10/2016    Procedure: AMPUTATION TOE;  Surgeon: Chrystie Nose, DPM;  Location: [facility] OR;  Service: Podiatry;  Laterality: Right;   . PROCEDURE N/A 11/28/2016    Procedure: COLONOSCOPY; BIOPSY;  Surgeon: Gailen Shelter, MD;  Location: [facility] OR;  Service: [facility] Procedures;  Laterality: N/A;  ink spot marking apple core mass proximal lesion distal to hepatic flexure   . PROCEDURE Right 12/28/2016    Procedure: LAPAROPSCOPIC CONVERTED TO OPEN HAND ASSISTED RIGHT  HEMI COLECTOMY;  Surgeon: Truitt Merle, MD;  Location: [facility] OR;  Service: [facility] Procedures;  Laterality: Right;  Dr. Oran Rein wants 3 hours   . PROCEDURE N/A 01/15/2018    Procedure: COLONOSCOPY; W/ POSSIBLE BIOPSY & POLYPECTOMY;  Surgeon: Truitt Merle, MD;  Location: [facility] OR;  Service: [facility] Procedures;  Laterality: N/A;   . PROCEDURE N/A 03/19/2018    Procedure: PARATHYROIDECTOMY; RECURRENT LARYNGEAL NERVE MONITORING, INTRAOPERATIVE PARATHYROID HORMONE;  Surgeon: Edmund Hilda, MD;  Location: [facility] OR;  Service: Ear Nose and Throat;  Laterality: N/A;   . TONSILLECTOMY     . TUBAL LIGATION  1980     Family History:  family history includes Breast cancer in her paternal aunt; Cancer in her brother and  maternal uncle; Colon cancer in her mother; Heart disease in her father, maternal aunt, maternal uncle, and sister; Ovarian cancer in her maternal aunt.  Social History:   reports that she has never smoked. She has been exposed to tobacco smoke. She has never used smokeless tobacco. She reports that she does not drink alcohol and does not use drugs.  Allergies:  Allergies   Allergen Reactions   . Heparin Anaphylaxis     Possible HIT   . Sulfa (Sulfonamide Antibiotics) Rash     In ICU for 3 days post dose       Home Medications:  Current Outpatient Medications   Medication Instructions   . acetaminophen (TYLENOL) 500 mg, Oral, Every 6 hours PRN   . aspirin 81 mg, Every morning   . atorvastatin (LIPITOR) 20 mg, Oral, Nightly   . CALCIUM ORAL 1 tablet, Oral, Every morning   . carvediloL (COREG) 6.25 mg, Oral, 2 times daily with meals   . cholecalciferol, vitamin D3, (VITAMIN D3 ORAL) 1 tablet, Oral, Every morning   . EPINEPHrine (EPIPEN) 0.3 mg, Intramuscular, Once   . ibuprofen (ADVIL) 200 mg, 2 times daily PRN   . letrozole [facility]) 2.5 mg, Oral, Daily      Immunization History   Administered Date(s) Administered   . FLU 65+yrs QUADRIVALENT ADJUVANTED INJ(PF) 04/23/2020   . Flu 64mo-76yrs Quadrivalent Inj(PF) 02/25/2016, 03/20/2018, 04/09/2019   . Janssen Sars-cov-2 Vaccination 08/19/2019   . Moderna 18 yr+ SARS-CoV-2 Booster (RED cap) 04/23/2020   . Pfizer 12+ yrs International aid/development worker 04/14/2021   . Pneumococcal Conjugate 13-Valent 07/27/2020     ROS:  Review of Systems   Constitutional: Negative for chills and fever.   Respiratory: Negative for cough, shortness of breath and wheezing.    Genitourinary: Negative for difficulty urinating, dysuria, pelvic pain, vaginal bleeding, vaginal discharge and vaginal pain.   Musculoskeletal:        Left Shoulder pain  Left breast pain   Skin: Positive for rash (under breasts and in groin creases).      Physical Exam:   BP 136/88 (BP Location: Left arm, Patient Position:  Sitting)   Pulse 76   Temp 36.3 ?C (97.4 ?F) (Temporal)   Wt 264 lb 4.8 oz (119.9 kg)   LMP  (LMP Unknown)   SpO2 99%   BMI 46.82 kg/m?   Physical Exam  Vitals and nursing note reviewed.   Constitutional:       Appearance: Normal appearance.   HENT:      Head: Normocephalic.   Cardiovascular:      Rate and Rhythm: Normal rate.   Pulmonary:      Effort: Pulmonary effort is normal.   Musculoskeletal:  General: Normal range of motion.      Cervical back: Normal range of motion.   Skin:     General: Skin is warm and dry.   Neurological:      Mental Status: She is alert and oriented to person, place, and time.   Psychiatric:         Mood and Affect: Mood normal.         Behavior: Behavior normal.     Chaperone present for exam. Erin, LPN  External Genitalia: Normal appearance, no labial fusion, no lesions, no rash.  Urethral meatus: Normal size, appearance and location, no discharge.  Urethra: No masses, no tenderness, no discharge.  Bladder: No fullness, no masses.  Vagina: Normal vaginal vault, no discharge, no lesions.  Cervix: Normal appearance, no lesions, no discharge, no friability.  Uterus: Normal size, mobile, no tenderness.  Adnexa: No tenderness, no masses.  Anus and perineum: Normal on appearance.    Patient's past medical history, surgical history, medications, allergies family, social history were reviewed and updated as needed.    Assessment/ Plan:      ICD-10-CM    1. Endometrial thickening on ultrasound  R93.89          1. Endometrial thickening on ultrasound- Merida has a history of malignant neoplasm of colon, stage 2, dx 11/2016 and had resection 12/27/2016. Path showed pT3N0 tumor: Grade 1, no LVI, no perineural invasion, 0/29 LNs, no perforation and negative margins.   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      -The endometrium is not well-visualized but appears  thickened with a         possible polypoid lesion at the fundus.  Recommend further evaluation         with dedicated pelvic ultrasound.      -Chronic areas of mild bibasilar interstitial thickening and patchy areas of         ground-glass, similar to prior and likely due to chronic inflammatory         process.       -Additional chronic/incidental findings as detailed above.   - 04/18/2021- TVUS- IMPRESSION:      1. Significantly abnormally thickened endometrium (25 mm) with           increased blood flow.  Colonic logical referral is recommended in this           postmenopausal female.      2. Likely fibroid within the uterus measuring up to 19 mm.      3. Nonvisualization of either focal ovary.   - 08/10/2021- Breast Biopsy Final Pathologic Diagnosis      A. Left breast, biopsy:        Procedure: Needle biopsy.        Specimen Laterality: Left.     Tumor:       Tumor Site: 2 o'clock (Per [place] report 08/10/2021).    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    Histologic Grade (Nottingham Histologic Score):    Glandular (Acinar)/Tubular Differentiation: Score 3.    Nuclear Pleomorphism: Score 3.    Mitotic Rate: Score 3.    Overall Score: Grade 3 (Score of 9).       Tumor Size: 3.    Ductal Carcinoma in Situ: Present.    Architectural Patterns: Solid.  Nuclear Grade: Grade III (high).    Necrosis: Not identified.    Lymphovascular Invasion: Not identified.    Microcalcifications: Present in invasive carcinoma.   - I discussed with Brindy her ultrasound finding and the significance of thickened endometrial lining.  We discussed EMB vs hysteroscopy D&C and I recommended consult with Dr Laurence Compton for hysteroscopy D&C due to the heterogenous nature of her endometrium.  She is agreeable to this.   - Pap collected today.  Donae Kueker, FNP    Update: pap results- 08/25/2021- NILM, HPV negative.  Eliseo Squires, FNP

## 2021-08-29 NOTE — Progress Notes (Signed)
I received a message the patient wanted a call to explain why there is an order for a PetCT by the doctor. I tried to call and had to leave a VM for her to call me back.

## 2021-08-30 ENCOUNTER — Ambulatory Visit: Admit: 2021-08-30 | Discharge: 2021-08-30 | Attending: MD

## 2021-08-30 DIAGNOSIS — R9389 Abnormal findings on diagnostic imaging of other specified body structures: Secondary | ICD-10-CM

## 2021-08-30 NOTE — Telephone Encounter (Signed)
Patient- Brandy [name]  DOB- November 30, 1955  Surgeon- Laurence Compton  Outpatient- same day  Diagnosis- thickened endometrial lining  ICD10 codes- R93.89  Procedure- hysteroscopy D&C  CPT codes- 16109  Include doctors chart notes from - 08/30/21  Include imaging from- 04/18/21

## 2021-08-30 NOTE — Progress Notes (Signed)
Pt presents today for A hysteroscopy and D&C consult due to thicken lining of uterus patient states.

## 2021-08-30 NOTE — Progress Notes (Signed)
Brandy [name] is a 66 y.o. female, DOB 04-04-1956, who presents today for evaluation of a thickened endometrial lining.      History of Present Illness  HPI   Brandy [name] presents for evaluation of a thickened endometrial lining.  Her medical history is notable for colon and breast cancer.  She had a surveillance pelvic ultrasound on 04/18/21 which showed a significantly abnormally thickened endometrial lining (2.5 cm) with increased blood flow.  She denies any fevers, chills, abnormal vaginal discharge, bleeding, or pelvic pain.  Her last menstrual cycle was more than 10 years ago.  She is nervous that she might have another type of cancer.       Review of Systems     Constitutional: ?Denies fevers or chills  Neurologic: ?Denies headaches or neuropathy  CV: ?Denies chest pain or palpitations  PULM: ?Denies shortness of breath or cough  GI: ?Denies diarrhea or constipation  GU: ?Denies dysuria or hematuria  Ext: ?Denies any musculoskeletal issues    Vitals:    08/30/21 1425   BP: 122/80   Pulse: 83   Temp: 36.1 ?C (97 ?F)   SpO2: 98%    Body mass index is 46.94 kg/m?Marland Kitchen  Physical Exam    General: ?NAD  CV: ?RRR  Abdomen: ?Soft, nondistended  Ext: ?No calf pain or swelling  External genitalia: ?Normal appearance, no lesions  Urethral meatus: ?Normal size and location, no lesions  Urethra: ?No masses or tenderness  Vagina:??no abnormal discharge  Cervix: ?No discharge or lesion, no cervical motion tenderness  Uterus: ?Normal size, mobile, no tenderness  Adnexa: ?Normal size, no tenderness    ASSESSMENT & PLAN:       ICD-10-CM    1. Endometrial thickening on ultrasound  R93.89         1.  Abnormally Thickened Endometrium - I will set [facility] up for a diagnostic hysteroscopy and D&C.

## 2021-08-31 NOTE — Telephone Encounter (Signed)
FYI, We have her scheduled for 4/7.

## 2021-08-31 NOTE — Telephone Encounter (Signed)
Letter mailed with procedure info.

## 2021-08-31 NOTE — Telephone Encounter (Signed)
Patient returns call.    She is informed that her D&C is scheduled for 4/7 with an approximate check in time of 9:30.    Notified of pre op visit on 3/28 at 1:40 and to check in at the day surgery area next door to the birthing center, which is the same place she will check in for procedure.    She is made aware that they will discuss pre op instructions like when to stop eating and drinking prior to procedure and when to stop or take medication day of procedure.    She is aware that this is a same day and will need someone to drive her home.    She is aware that no post op appointment is necessary as Dr. Laurence Compton will call with pathology results.    She is made aware that she may have some spotting and cramping after procedure.     Informed that I will mail out all these details.  Encouraged to call if has any questions.  Patient verbalizes understanding.

## 2021-08-31 NOTE — Telephone Encounter (Signed)
Patient is called to schedule her D&C.  She is offered 4/7 and she accepts.  She does have a ultrasound guided biopsy the day before but she is ok with doing D&C on 4/7.    She is currently not home, so she will call back when she gets home to go over details.

## 2021-08-31 NOTE — Telephone Encounter (Signed)
I have checked the Medicare/[employer] website and prior authorization is not required for CPT 58558.

## 2021-09-01 ENCOUNTER — Inpatient Hospital Stay: Admit: 2021-09-01 | Discharge: 2021-09-21 | Attending: Medical Oncology

## 2021-09-01 DIAGNOSIS — C50912 Malignant neoplasm of unspecified site of left female breast: Secondary | ICD-10-CM

## 2021-09-06 ENCOUNTER — Ambulatory Visit: Admit: 2021-09-06 | Discharge: 2021-09-06

## 2021-09-06 DIAGNOSIS — Z01810 Encounter for preprocedural cardiovascular examination: Secondary | ICD-10-CM

## 2021-09-06 LAB — CBC WITH AUTO DIFFERENTIAL
Basophils %: 1 % (ref 0–2)
Basophils, Absolute: 0 10*3/ÂµL (ref 0.0–0.1)
Eosinophils %: 2 % (ref 0–5)
Eosinophils, Absolute: 0.1 10*3/ÂµL (ref 0.0–0.4)
HCT: 39.7 % (ref 35.0–48.0)
Hemoglobin: 13.2 g/dL (ref 11.7–16.5)
Lymphocytes %: 41 % — ABNORMAL HIGH (ref 20–40)
Lymphocytes, Absolute: 2.1 10*3/ÂµL (ref 1.0–4.0)
MCH: 29.8 pg (ref 28.3–33.3)
MCHC: 33.2 g/dL (ref 32.5–36.0)
MCV: 89.6 fL (ref 81.0–100.0)
MPV: 7 fL (ref 6.9–10.0)
Monocytes %: 6 % (ref 2–12)
Monocytes, Absolute: 0.3 10*3/ÂµL (ref 0.2–1.0)
Neutrophils %: 51 % (ref 50–74)
Neutrophils, Absolute: 2.6 10*3/ÂµL (ref 2.0–7.4)
Platelet Count: 236 10*3/ÂµL (ref 150–405)
RBC: 4.43 10*6/ÂµL (ref 3.80–5.60)
RDW: 15.1 % (ref 11.7–16.1)
WBC: 5.2 10*3/ÂµL (ref 4.5–11.0)

## 2021-09-06 LAB — COMPREHENSIVE METABOLIC PANEL
ALT - Alanine Aminotransferase: 14 IU/L (ref 5–33)
AST - Aspartate Aminotransferase: 20 IU/L (ref 5–32)
Albumin/Globulin Ratio: 1 — ABNORMAL LOW (ref 1.2–2.2)
Albumin: 4.1 g/dL (ref 3.5–5.0)
Alkaline Phosphatase: 96 IU/L (ref 35–105)
Anion Gap: 10.1 mmol/L (ref 9.0–18.0)
BUN / Creatinine Ratio: 17.3 (ref 12.0–20.0)
BUN: 13 mg/dL (ref 8–20)
Bilirubin Total: 0.5 mg/dL (ref 0.10–1.70)
CO2 - Carbon Dioxide: 26 mmol/L (ref 17–27)
Calcium: 9.7 mg/dL (ref 8.6–10.6)
Chloride: 102.9 mmol/L (ref 101.0–111.0)
Creatinine: 0.75 mg/dL (ref 0.60–1.30)
Glomerular Filtration Rate Estimate (Female): 88 mL/min/{1.73_m2} (ref >=60)
Glucose: 92 mg/dL (ref 74–106)
Osmolality Calculation: 277.3 mOsm/kg (ref 275.0–300.0)
Potassium: 4.45 mmol/L (ref 3.50–5.10)
Protein Total: 8.1 g/dL (ref 6.4–8.2)
Sodium: 139 mmol/L (ref 135–145)

## 2021-09-06 NOTE — Discharge Instructions (Addendum)
[facility] PRE-OP INSTRUCTIONS    Dear Corrie Dandy,  It was a pleasure to talk to you today.    Date of Surgery: Friday April 7 arrive at 9:30 am    You are going home the same day as surgery, please arrange for an adult to drive you home and be with you for 24 hours after your surgery. Please make sure you have accurate contact information for the person that will be picking you up.    Your Surgeon's office will contact you with your arrival time the working day prior to your procedure.    Arrive at the Surgery Entrance on [address], next to the [facility].  Please wear a mask.  At this time one visitor is allowed in the surgery waiting area.  If you do not have a family or friend waiting for you, please have a good working phone number so your discharge RN may call your ride when you are ready to leave.      Do not EAT anything after midnight the night prior to your surgery.    ? Your anesthesiologist will allow you to have clear liquids only (liquids such as water, black coffee, tea, Gatorade, apple juice, cranberry juice, or coconut water up to 2 hours before your scheduled arrival time. Avoid orange juice, and other pulpy juices, energy drinks, alcohol, and coffee with dairy products.      If you develop a fever/chills, cough, shortness of breath, excessive fatigue, muscle and joint pain, headaches, loss of taste/smell, nasal congestion, nausea/vomiting, and diarrhea.  In addition if you have had close contact to a person with known Covid-19 infection within the past 10 days.  Please notify your primary care provider, your surgeon or the pre op clinic as soon as possible if you have any of these symptoms.      Before leaving for the hospital:     Take a soapy shower or bath and brush teeth.   Please do not wear eye make-up.   Please wear NO jewelry, no watches, no rings, or necklaces, and remove all piercings.    Please wear casual, non-restrictive clothing.   Do not bring credit  cards, cash, medication, jewelry, weapons, or other valuables.    What To Bring With You    ? Please bring your glasses,denture, hearing aids and a case for these items. Do not wear contact lenses.    Additional instructions: Dr. Gwenevere Abbot  is your anesthesiologist.  Please take all prescribed morning medications the day of your procedure.  It is not necessary to take vitamins and supplements.  Please stop motrin, aspirin, ibuprofen, and aleve as directed by your surgeon.     Pre-op Clinic phone: [phone]  Angie RN

## 2021-09-08 NOTE — Progress Notes (Signed)
Received verbal order from Dr. Nadeen Landau for [facility] [employer] Testing for [employer] and Colaris. Patient states that she will complete testing 09/16/21 before 1100.

## 2021-09-15 ENCOUNTER — Inpatient Hospital Stay: Admit: 2021-09-15 | Discharge: 2021-10-10

## 2021-09-15 ENCOUNTER — Other Ambulatory Visit: Admit: 2021-09-15

## 2021-09-15 DIAGNOSIS — Z170 Estrogen receptor positive status [ER+]: Secondary | ICD-10-CM

## 2021-09-15 DIAGNOSIS — C773 Secondary and unspecified malignant neoplasm of axilla and upper limb lymph nodes: Secondary | ICD-10-CM

## 2021-09-15 LAB — CBC WITH AUTO DIFFERENTIAL
Basophils %: 1 % (ref 0–2)
Basophils, Absolute: 0.1 10*3/ÂµL (ref 0.0–0.1)
Eosinophils %: 1 % (ref 0–5)
Eosinophils, Absolute: 0.1 10*3/ÂµL (ref 0.0–0.4)
HCT: 38 % (ref 35.0–48.0)
Hemoglobin: 12.4 g/dL (ref 11.7–16.5)
Lymphocytes %: 39 % (ref 20–40)
Lymphocytes, Absolute: 1.9 10*3/ÂµL (ref 1.0–4.0)
MCH: 29.3 pg (ref 28.3–33.3)
MCHC: 32.8 g/dL (ref 32.5–36.0)
MCV: 89.4 fL (ref 81.0–100.0)
MPV: 7.1 fL (ref 6.9–10.0)
Monocytes %: 7 % (ref 2–12)
Monocytes, Absolute: 0.4 10*3/ÂµL (ref 0.2–1.0)
Neutrophils %: 51 % (ref 50–74)
Neutrophils, Absolute: 2.5 10*3/ÂµL (ref 2.0–7.4)
Platelet Count: 212 10*3/ÂµL (ref 150–405)
RBC: 4.25 10*6/ÂµL (ref 3.80–5.60)
RDW: 14.8 % (ref 11.7–16.1)
WBC: 5 10*3/ÂµL (ref 4.5–11.0)

## 2021-09-15 LAB — TISSUE EXAM

## 2021-09-15 LAB — APTT: APTT: 32 Seconds (ref 24–36)

## 2021-09-15 LAB — COMPREHENSIVE METABOLIC PANEL
ALT - Alanine Aminotransferase: 11 IU/L (ref 5–33)
AST - Aspartate Aminotransferase: 16 IU/L (ref 5–32)
Albumin/Globulin Ratio: 1 — ABNORMAL LOW (ref 1.2–2.2)
Albumin: 4.1 g/dL (ref 3.5–5.0)
Alkaline Phosphatase: 93 IU/L (ref 35–105)
Anion Gap: 11.8 mmol/L (ref 9.0–18.0)
BUN / Creatinine Ratio: 16.4 (ref 12.0–20.0)
BUN: 12 mg/dL (ref 8–20)
Bilirubin Total: 0.5 mg/dL (ref 0.10–1.70)
CO2 - Carbon Dioxide: 25 mmol/L (ref 17–27)
Calcium: 9.3 mg/dL (ref 8.6–10.6)
Chloride: 103.2 mmol/L (ref 101.0–111.0)
Creatinine: 0.73 mg/dL (ref 0.60–1.30)
Glomerular Filtration Rate Estimate (Female): >90 mL/min/{1.73_m2} (ref >=60)
Glucose: 92 mg/dL (ref 74–106)
Osmolality Calculation: 278.8 mOsm/kg (ref 275.0–300.0)
Potassium: 3.79 mmol/L (ref 3.50–5.10)
Protein Total: 8.2 g/dL (ref 6.4–8.2)
Sodium: 140 mmol/L (ref 135–145)

## 2021-09-15 LAB — PROTIME-INR
INR: 1.1 (ref 0.9–1.1)
Protime: 13.2 Seconds (ref 10.0–13.2)

## 2021-09-15 LAB — CA 15-3 (SLM): CA 15-3: 26 U/mL — ABNORMAL HIGH (ref <=25.0)

## 2021-09-15 LAB — PLATELET COUNT: Platelet Count: 203 10*3/ÂµL (ref 150–405)

## 2021-09-15 LAB — CEA: CEA: 11.1 ng/mL — ABNORMAL HIGH (ref 0.0–5.0)

## 2021-09-15 MED ORDER — lidocaine 10 mg/mL (1 %) solution 4 mL
10 | Freq: Once | INTRAMUSCULAR | Status: AC
Start: 2021-09-15 — End: 2021-09-15
  Administered 2021-09-15: 16:00:00 10 mL via SUBCUTANEOUS

## 2021-09-15 MED ORDER — sodium bicarbonate 4.2 % (0.5 mEq/mL) injection 1 mEq
4.2 | Freq: Once | INTRAVENOUS | Status: AC
Start: 2021-09-15 — End: 2021-09-15
  Administered 2021-09-15: 16:00:00 4.2 meq via SUBCUTANEOUS

## 2021-09-15 NOTE — Progress Notes (Signed)
Pt ambulatory to the department.

## 2021-09-15 NOTE — Telephone Encounter (Signed)
Called pt to notify of procedure date on 09/16/21 at 11:00. Discussed with pt check in time of 9:30. Pt verbalized understanding, has no questions or concerns at this time. .Advise pt to call clinic if she has any question or concerns.  Pt appreciative.

## 2021-09-15 NOTE — Progress Notes (Signed)
Pt returned from procedure, report received. Left axilla site drsg CDI, pt denies discomfort.

## 2021-09-15 NOTE — Progress Notes (Signed)
Pt dsch to home, dsch instructions given with understanding.

## 2021-09-15 NOTE — Pre Sedation (Signed)
Patient arrived to US room via wheelchair

## 2021-09-15 NOTE — Other (Signed)
Patient tolerated procedure well. Ice pack placed to left axilla. SBAR report given to [employer] in [facility]CD via [place] text

## 2021-09-15 NOTE — H&P (Signed)
HISTORY AND PHYSICAL    Brandy [name]  DOB:  [birth year]   MRN:   [id number]      CHIEF COMPLAINT  Thickened Endometrial Lining     HPI  Brandy [name] is a 66 y.o. who originally presented for evaluation of a thickened endometrial lining.  Her medical history is notable for colon and breast cancer.  She had a surveillance pelvic ultrasound on 04/18/21 which showed a significantly abnormally thickened endometrial lining (2.5 cm) with increased blood flow.  She denies any fevers, chills, abnormal vaginal discharge, bleeding, or pelvic pain.  Her last menstrual cycle was more than 10 years ago.  She is nervous that she might have another type of cancer.    REVIEW OF SYSTEMS  See HPI for further details. Review of systems otherwise negative.     PAST MEDICAL HISTORY  Past Medical History:   Diagnosis Date   . Arrhythmia    . CHF (congestive heart failure) (HCC)    . Colon cancer (HCC) 12/28/2016   . Colonic mass 11/28/2016    POSTOPERATIVE DIAGNOSIS 11/28/16 colonoscopy:  1) Colonoscopy with 3/4 circumference apple core lesion at full insertion of the colonoscope but likely distal to the Hepatic flexure 2) Hemorrhoids: mild internal 3) Mass: hot forceps biopsied times two without complications and tattooed times two proximal to the mass  Biopsy suspicious for intramucosal adenocarcinoma. Referral to Dr Oran Rein done and she   . Encounter for screening colonoscopy 12/05/2017   . History of blood transfusion    . HIT (heparin-induced thrombocytopenia) (HCC)    . Hyperlipidemia    . 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 Screening for colon cancer 10/04/2016    POSTOPERATIVE DIAGNOSIS 11/28/16 colonoscopy:  1) Colonoscopy with 3/4 circumference apple core lesion at full insertion of the colonoscope but likely distal to the Hepatic flexure 2) Hemorrhoids: mild internal 3) Mass: hot forceps biopsied times two without complications and tattooed times two  proximal to the mass  Biopsy suspicious for intramucosal adenocarcinoma. Referral to Dr Oran Rein done and she   . Thyroid disease     Hyperparathyroid       SURGICAL HISTORY  Past Surgical History:   Procedure Laterality Date   . CARDIAC SURGERY  01/23/2016    AVR   . COLON SURGERY  2018    Hemicolectomy   . COLONOSCOPY N/A 05/04/2021    Procedure: COLONOSCOPY; W/ POSSIBLE BIOPSY & POLYPECTOMY;  Surgeon: Truitt Merle, MD;  Location: [facility] OR;  Service: [facility] Procedures;  Laterality: N/A;   . CORONARY ANGIOPLASTY     . PROCEDURE N/A 01/27/2016    Procedure: AORTIC VALVE REPLACEMENT; INTRA AORTIC BALLOON INSERTION;  Surgeon: Otho Bellows, MD;  Location: [facility] OR;  Service: Cardiology;  Laterality: N/A;   . PROCEDURE Right 03/10/2016    Procedure: AMPUTATION TOE;  Surgeon: Chrystie Nose, DPM;  Location: [facility] OR;  Service: Podiatry;  Laterality: Right;   . PROCEDURE N/A 11/28/2016    Procedure: COLONOSCOPY; BIOPSY;  Surgeon: Gailen Shelter, MD;  Location: [facility] OR;  Service: [facility] Procedures;  Laterality: N/A;  ink spot marking apple core mass proximal lesion distal to hepatic flexure   . PROCEDURE Right 12/28/2016    Procedure: LAPAROPSCOPIC CONVERTED TO OPEN HAND ASSISTED RIGHT HEMI COLECTOMY;  Surgeon: Truitt Merle, MD;  Location: [facility] OR;  Service: [facility] Procedures;  Laterality: Right;  Dr. Oran Rein wants 3 hours   . PROCEDURE N/A 01/15/2018  Procedure: COLONOSCOPY; W/ POSSIBLE BIOPSY & POLYPECTOMY;  Surgeon: Truitt Merle, MD;  Location: [facility] OR;  Service: [facility] Procedures;  Laterality: N/A;   . PROCEDURE N/A 03/19/2018    Procedure: PARATHYROIDECTOMY; RECURRENT LARYNGEAL NERVE MONITORING, INTRAOPERATIVE PARATHYROID HORMONE;  Surgeon: Edmund Hilda, MD;  Location: [facility] OR;  Service: Ear Nose and Throat;  Laterality: N/A;   . TONSILLECTOMY     . TUBAL LIGATION  1980   . US GUIDED LYMPH NODE BIOPSY  09/15/2021    US GUIDED LYMPH NODE BIOPSY 09/15/2021 Jeanice Lim, MD [facility] [place] [facility]       OBSTETRIC HISTORY     OB History   Gravida Para Term  Preterm AB Living   3 3 3          SAB IAB Ectopic Multiple Live Births                  # Outcome Date GA Lbr Len/2nd Weight Sex Delivery Anes PTL Lv   3 Term            2 Term            1 Term                FAMILY HISTORY  Family History   Problem Relation Age of Onset   . Colon cancer Mother         dx at older age   . Heart disease Father    . Heart disease Sister    . Cancer Brother    . Heart disease Maternal Aunt    . Ovarian cancer Maternal Aunt    . Cancer Maternal Uncle    . Heart disease Maternal Uncle    . Breast cancer Paternal Aunt        SOCIAL HISTORY  Social History     Socioeconomic History   . Marital status: Married   Tobacco Use   . Smoking status: Never     Passive exposure: Yes   . Smokeless tobacco: Never   . Tobacco comments:     SBIRT 02/13/2018 [facility] Father smoked in the home.    Vaping Use   . Vaping Use: Never used   Substance and Sexual Activity   . Alcohol use: No   . Drug use: No   . Sexual activity: Not Currently     Partners: Male     Birth control/protection: Post-menopausal       CURRENT MEDICATIONS  See list    ALLERGIES  Allergies   Allergen Reactions   . Heparin Anaphylaxis     Possible HIT   . Sulfa (Sulfonamide Antibiotics) Rash     In ICU for 3 days post dose         PHYSICAL EXAM  VITAL SIGNS: LMP  (LMP Unknown)   General: ?NAD  CV: ?RRR  Abdomen: ?Soft, nondistended  Ext: ?No calf pain or swelling  External genitalia: ?Normal appearance, no lesions  Urethral meatus: ?Normal size and location, no lesions  Urethra: ?No masses or tenderness  Vagina:??no abnormal discharge  Cervix: ?No discharge or lesion, no cervical motion tenderness  Uterus: ?Normal size, mobile, no tenderness  Adnexa: ?Normal size, no tenderness    LABS  Recent Results (from the past 24 hour(s))   Protime Panel -STAT    Collection Time: 09/15/21  7:24 AM   Result Value Ref Range    Protime 13.2 10.0 - 13.2 Seconds    INR 1.1 0.9 - 1.1  APTT -STAT    Collection Time: 09/15/21  7:24 AM   Result Value Ref  Range    APTT 32 24 - 36 Seconds   Platelet Count -STAT    Collection Time: 09/15/21  7:24 AM   Result Value Ref Range    Platelet Count 203 150 - 405 10*3/?L   CA 15-3 ([facility]) -Routine    Collection Time: 09/15/21  1:05 PM   Result Value Ref Range    CA 15-3 26.0 (H) <=25.0 U/mL   Carcinoembryonic Antigen (CEA) -Routine    Collection Time: 09/15/21  1:05 PM   Result Value Ref Range    CEA 11.1 (H) 0.0 - 5.0 ng/mL   Comprehensive Metabolic Panel -Routine    Collection Time: 09/15/21  1:05 PM   Result Value Ref Range    Sodium 140 135 - 145 mmol/L    Potassium 3.79 3.50 - 5.10 mmol/L    Chloride 103.2 101.0 - 111.0 mmol/L    CO2 - Carbon Dioxide 25 17 - 27 mmol/L    BUN 12 8 - 20 mg/dL    Creatinine 4.78 2.95 - 1.30 mg/dL    Glucose 92 74 - 621 mg/dL    Calcium 9.3 8.6 - 30.8 mg/dL    AST - Aspartate Aminotransferase 16 5 - 32 IU/L    ALT - Alanine Aminotransferase 11 5 - 33 IU/L    Alkaline Phosphatase 93 35 - 105 IU/L    Protein Total 8.2 6.4 - 8.2 g/dL    Albumin 4.1 3.5 - 5.0 g/dL    Bilirubin Total 6.57 0.10 - 1.70 mg/dL    Anion Gap 84.6 9.0 - 18.0 mmol/L    Albumin/Globulin Ratio 1.0 (L) 1.2 - 2.2    Glomerular Filtration Rate Estimate (Female) >90 >=60 mL/min/1.7m*2    GFR Additional Info      BUN / Creatinine Ratio 16.4 12.0 - 20.0    Osmolality Calculation 278.8 275.0 - 300.0 mOsm/kg   CBC with Auto Differential -Routine    Collection Time: 09/15/21  1:05 PM   Result Value Ref Range    WBC 5.0 4.5 - 11.0 10*3/?L    RBC 4.25 3.80 - 5.60 10*6/?L    Hemoglobin 12.4 11.7 - 16.5 g/dL    HCT 96.2 95.2 - 84.1 %    MCV 89.4 81.0 - 100.0 fL    MCH 29.3 28.3 - 33.3 pg    MCHC 32.8 32.5 - 36.0 g/dL    RDW 32.4 40.1 - 02.7 %    Platelet Count 212 150 - 405 10*3/?L    MPV 7.1 6.9 - 10.0 fL    Neutrophils % 51 50 - 74 %    Lymphocytes % 39 20 - 40 %    Monocytes % 7 2 - 12 %    Eosinophils % 1 0 - 5 %    Basophils % 1 0 - 2 %    Neutrophils, Absolute 2.5 2.0 - 7.4 10*3/?L    Lymphocytes, Absolute 1.9 1.0 - 4.0 10*3/?L     Monocytes, Absolute 0.4 0.2 - 1.0 10*3/?L    Eosinophils, Absolute 0.1 0.0 - 0.4 10*3/?L    Basophils, Absolute 0.1 0.0 - 0.1 10*3/?L         ASSESSMENT/PLAN  50. 66 year old with a thickened endometrial lining.    Plan:  Diagnostic hysteroscopy and a D&C.

## 2021-09-15 NOTE — Pre Sedation (Addendum)
Awaiting Dr Beverlyn Roux discussion with pathology.

## 2021-09-16 LAB — TISSUE EXAM
Clinical History: ABNORMAL
Post-Operative Findings: ABNORMAL
Preoperative Diagnosis: ABNORMAL

## 2021-09-16 MED ORDER — propofoL (DIPRIVAN) injection
10 | INTRAVENOUS | Status: DC | PRN
Start: 2021-09-16 — End: 2021-09-16
  Administered 2021-09-16: 18:00:00 10 mg/mL via INTRAVENOUS

## 2021-09-16 MED ORDER — HYDROmorphone (DILAUDID) injection 0.2-0.5 mg
0.5 | INTRAMUSCULAR | Status: DC | PRN
Start: 2021-09-16 — End: 2021-09-16

## 2021-09-16 MED ORDER — ondansetron (ZOFRAN) injection 4 mg
4 | Freq: Four times a day (QID) | INTRAMUSCULAR | Status: DC | PRN
Start: 2021-09-16 — End: 2021-09-16

## 2021-09-16 MED ORDER — hydrALAZINE (APRESOLINE) injection 10 mg
20 | INTRAMUSCULAR | Status: DC | PRN
Start: 2021-09-16 — End: 2021-09-16

## 2021-09-16 MED ORDER — diphenhydrAMINE (BENADRYL) injection 12.5-25 mg
50 | INTRAMUSCULAR | Status: DC | PRN
Start: 2021-09-16 — End: 2021-09-16

## 2021-09-16 MED ORDER — fentaNYL (PF) (SUBLIMAZE) injection
50 | INTRAMUSCULAR | Status: DC | PRN
Start: 2021-09-16 — End: 2021-09-16
  Administered 2021-09-16 (×2): 50 mcg/mL via INTRAVENOUS

## 2021-09-16 MED ORDER — Lactated Ringers infusion
INTRAVENOUS | Status: DC
Start: 2021-09-16 — End: 2021-09-16
  Administered 2021-09-16 (×2): via INTRAVENOUS

## 2021-09-16 MED ORDER — fentaNYL (PF) (SUBLIMAZE) 50 mcg/mL injection
50 | INTRAMUSCULAR | Status: AC
Start: 2021-09-16 — End: ?

## 2021-09-16 MED ORDER — meperidine (PF) (DEMEROL) injection 12.5 mg
25 | INTRAMUSCULAR | Status: DC | PRN
Start: 2021-09-16 — End: 2021-09-16

## 2021-09-16 MED ORDER — labetaloL (NORMODYNE) injection 5 mg
5 | INTRAVENOUS | Status: DC | PRN
Start: 2021-09-16 — End: 2021-09-16

## 2021-09-16 MED ORDER — glycopyrrolate (PF) in water (ROBINUL) 0.4 mg/2 mL (0.2 mg/mL) syringe 0.4 mg
0.4 | INTRAVENOUS | Status: DC | PRN
Start: 2021-09-16 — End: 2021-09-16

## 2021-09-16 MED ORDER — droperidoL (INAPSINE) injection 0.625 mg
2.5 | Freq: Once | INTRAMUSCULAR | Status: DC | PRN
Start: 2021-09-16 — End: 2021-09-16

## 2021-09-16 MED ORDER — naloxone (NARCAN) syringe 0.4 mg
1 | INTRAMUSCULAR | Status: DC | PRN
Start: 2021-09-16 — End: 2021-09-16

## 2021-09-16 MED ORDER — fentaNYL (PF) (SUBLIMAZE) injection 25-50 mcg
50 | INTRAMUSCULAR | Status: DC | PRN
Start: 2021-09-16 — End: 2021-09-16

## 2021-09-16 MED ORDER — ipratropium-albuteroL (DUO-NEB) 0.5 mg-3 mg(2.5 mg base)/3 mL nebulizer solution 3 mL
0.5 | RESPIRATORY_TRACT | Status: DC | PRN
Start: 2021-09-16 — End: 2021-09-16

## 2021-09-16 MED ORDER — ondansetron (ZOFRAN) injection
4 | INTRAMUSCULAR | Status: DC | PRN
Start: 2021-09-16 — End: 2021-09-16
  Administered 2021-09-16: 18:00:00 4 mg/2 mL via INTRAVENOUS

## 2021-09-16 NOTE — Brief Op Note (Signed)
Brief Operative Note    Procedure(s) (LRB):  DILATATION AND CURETTAGE; HYSTEROSCOPY with myosure (N/A)     Preoperative Diagnoses:   abnormally thickened endometrium    Postoperative Diagnoses:  Post-Op Diagnosis Codes:     * Thickened endometrium [R93.89]     Surgeons: Surgeon(s) and Role:     * Conni Elliot, MD - Primary    Assistant(s): * No surgical staff found *     Anesthesia Provider: Anesthesiologist: Nancy Nordmann, MD    Anesthesia Type: General    Height & Weight:160 cm (63) & 119.1 kg (262 lb 9.6 oz)     Specimens:   ID Type Source Tests Collected by Time Destination   A : Endometrial currettings Tissue Endometrium, curettage TISSUE EXAM Conni Elliot, MD 09/16/2021 1116    B : Endometrial polyps Tissue Endometrium TISSUE EXAM Conni Elliot, MD 09/16/2021 1117               Implants:none    Antibiotics (admin):   Recent Abx Admin      No antibiotic adminstration found                Incision Time:   Anes Incision Time     Date Time Event    09/16/2021 1106 Incision / Procedure Start           Timeout Completed:  Timeouts     Artis Flock, RN at [facility] Sep 16, 2021 1052 PDT     Timeout Details     Timeout type: Pre-incision                Artis Flock, RN at [facility] Sep 16, 2021 1121 PDT     Timeout Details     Timeout type: Sign-out                       Estimated Blood Loss: 10 mL from 09/16/2021 11:06 AM to 09/16/2021 11:19 AM    Findings:  Uterine polyp.    Complications: None     Disposition: Home     Condition:  Good    Follow up:  In the clinic as needed.  She will be called with her pathology results.    Conni Elliot

## 2021-09-16 NOTE — Anesthesia Pre-Procedure Evaluation (Signed)
Anesthesia Evaluation     Patient summary reviewed    No history of anesthetic complications     Airway   Mallampati: II  TM distance: > 8 cm  Neck ROM: full  ULBT: I  obesity  no TMJ problem  neck not short    Dental    (+) edentulous    Pulmonary - negative ROS    breath sounds clear to auscultation  Cardiovascular   (-) past MI, CAD, murmur    Rhythm: regular  Rate: normal  ROS comment: S/p AVR - Good function as per latest TTE    Neuro/Psych    (-) CVA    GI/Hepatic/Renal    (+) obesity    Endo/Other     Musculoskeletal - negative ROS                   Anesthesia Plan    ASA 3     general     intravenous induction     Patient is not a smoker          Anesthetic plan, risks and benefits discussed with patient.  Risks discussed included (but were not limited to)   General risks drug reaction, dental injury, nausea, pain, respiratory events, sore throat, voice injury and deliriumConsenting person understands and agrees to proceed. anesthesia consent form used. PARQ.  Pre-Anesthesia Evaluation Completed at:  09/16/2021 10:36 AM

## 2021-09-16 NOTE — OR Nursing (Signed)
Patient met criteria for discharge home. Reviewed discharge instructions, including: follow up appointments, new medications to start, medications to discontinue, and MD instructions. Patient reported understanding and denied further questions. Patient left with belongings. Patient left unit  at approximately 1243

## 2021-09-16 NOTE — Op Note (Signed)
DATE OF SURGERY: 09/16/2021  PATIENT'S AGE: 66  SURGEON: Nile Dear. Laurence Compton, MD  ASSISTANT:  None.  ANESTHESIOLOGIST:  Assunta Gambles, MD.  ANESTHESIA:  General.    PREOPERATIVE DIAGNOSIS:  Abnormally thickened endometrium.    POSTOPERATIVE DIAGNOSIS:  Abnormally thickened endometrium.    OPERATION:  1.  Diagnostic hysteroscopy.  2.  Polypectomy.  3.  Dilation and curettage.    ESTIMATED BLOOD LOSS:  10 mL    FINDINGS:  Uterine polyp.    COMPLICATIONS:  None.    SPECIMENS:  Uterine polyp and endometrial curettings.    PROCEDURE:  After the surgical consent was signed, the patient was taken   to the operating room where a timeout procedure was done.  After   achievement of adequate anesthesia, the patient was prepped and draped in   the normal sterile fashion in the dorsal lithotomy position.  A weighted   speculum was placed in the vagina and a right angle retractor was placed   in the vagina providing good visualization of the cervix.  The anterior   lip of the cervix was grasped with a single tooth tenaculum.  The cervix   was dilated with a series of dilators.  The hysteroscope was gently   advanced into the uterine cavity.  The patient was noted to have one large  uterine polyp. As a result, the MyoSure hysteroscope was set up and placed  into the uterine cavity. Using the MyoSure, the polyp was removed under   direct visualization.  The polyp was sent to pathology.  The hysteroscope   was removed.  Sharp curettage was undertaken until the uterine lining had   a gritty feel to it.  The endometrial curettings were also sent to   pathology.  All instruments were removed from the vagina.  The anterior   lip of the cervix was hemostatic.  Sponge, needle and instrument counts   were correct x2.  The patient tolerated the procedure well and was taken   to the PACU awake and in stable condition.    Nile Dear. Laurence Compton, MD  D: 09/17/2021 13:41  T: 09/17/2021 14:05  J/R: [id number]  [facility] / Cherlynn Polo

## 2021-09-16 NOTE — Anesthesia Post-Procedure Evaluation (Signed)
Patient Name: Brandy [name]  Procedures performed: Procedure(s):  DILATATION AND CURETTAGE; HYSTEROSCOPY with myosure    Last Vitals:   Vitals Value Taken Time   BP 122/70 09/16/21 1155   Pulse 70 09/16/21 1155   Resp 16 09/16/21 1200   SpO2 91 % 09/16/21 1155   Temp 36.4 ?C 09/16/21 1200       Planned Anesthesia Type: general  Final Anesthesia Type: general  Patients Current Location: PACU  Level of Consciousness: awake  Post Procedure Pain:adequate analgesia  Airway: Patent  Respiratory Status: room air  Cardio Status: hemodynamically stable  Hydration: adequately hydrated  PONV prophylaxis Ordered  PONV? NO  no anesthesia complication,       planned opioid use           The patient was able to participate in the post op evaluation    Comments:      Bettey Mare, MD  12:22 PM

## 2021-09-20 NOTE — Telephone Encounter (Addendum)
Pt advised of note below, verbalized understanding.      ----- Message from Eliseo Squires, FNP sent at 09/20/2021 11:39 AM PDT -----  Please let Corrie Dandy know that her pap smear is normal and HPV negative.  Waiting for the pathology results from the procedure she had with Dr Laurence Compton.  Thank you

## 2021-09-22 ENCOUNTER — Encounter: Attending: Medical Oncology

## 2021-09-22 NOTE — Telephone Encounter (Signed)
-----   Message from Truitt Merle, MD sent at 09/20/2021  4:28 PM PDT -----  Has patient seen medical oncology yet?  If not she needs to be evaluated for neoadjuvant chemotherapy.

## 2021-09-22 NOTE — Telephone Encounter (Signed)
Yes, patient has been seen and CTC is working with her. RM

## 2021-09-27 ENCOUNTER — Ambulatory Visit: Admit: 2021-09-27 | Discharge: 2021-09-27 | Attending: Medical Oncology

## 2021-09-27 DIAGNOSIS — C50912 Malignant neoplasm of unspecified site of left female breast: Secondary | ICD-10-CM

## 2021-09-27 DIAGNOSIS — Z170 Estrogen receptor positive status [ER+]: Secondary | ICD-10-CM

## 2021-09-27 NOTE — Addendum Note (Signed)
Addended by: Lindley Magnus on: 10/03/2021 11:27 AM     Modules accepted: Orders

## 2021-09-27 NOTE — Patient Instructions (Addendum)
Instructions and Staff Orders :   Plan start of Taxotere + Carboplatin + Herceptin + Perjeta after portacath placement.    Lab studies : cbc, cmp, CA 15-3, CEA    Imaging : Awaiting PET Scan on 10/12/2021  Dexa Scan : Bone Density study : Post menopausal on Aromatase inhibitors     Referral: Dr Oran Rein for portacath placement    Medications/Rx : No    Follow up : After May 3rd to discuss PET-CT results and or after first dose of Taxotere + Carboplatin + Herceptin + Perjeta.

## 2021-09-27 NOTE — Progress Notes (Signed)
The patient stated that she is still spotting since her surgery but not every day. She said she had the tumor removed from her uterus on 09/16/21 and is achy. She said she does not have any other new symptoms.

## 2021-09-27 NOTE — Progress Notes (Addendum)
NAME:  Brandy [name]  MRN:  [id number]  DOB:  October 05, 1955    PROGRESS NOTE      REASON FOR FOLLOW-UP:     This is a 65 y.o. female who presents today for a follow-up regarding Malignant neoplasm of left breast in female, estrogen receptor positive, unspecified site of breast (HCC) [C50.912, Z17.0] .    HISTORY OF PRESENT ILLNESS:    HPI   Patient self-referred herself for a screening mammogram in February 2023: BI-RADS 0 incomplete:  This reported unusual mass in the left mid breast outer lower quadrant measuring 11 mm.  Associated with some calcifications and architectural distortion.  Adjacent possible satellite lesion measuring 7 mm.  Diagnostic ultrasound recommended with biopsy as needed.    07/29/2021: Ultrasound breast bilateral:  Left breast: Irregular 10 mm spiculated mass with irregular margins and posterior shadowing in the left breast at the 3 o'clock position.  Enlarged lymph nodes with thickened cortex in the left axilla measuring 4 cm.  BI-RADS 5-highly suggestive of malignancy.  Irregular mass in the left lateral breast with enlarged lymph node in the left axilla.    08/10/2021: Ultrasound-guided breast biopsy left:  No suspicious clusters of microcalcifications.  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  ER 99%, PR 95% HER2/neu 3+, Ki-67 70%, p53 70%    09/20/2021:  Left axillary lymph node needle core biopsy: Metastatic carcinoma similar to that in the left breast biopsy performed 4 weeks ago with similar positive ER 25% PR 2% and positive 3+ HER2 profile; Ki-67 40%, p53 35%.    09/20/2021 endometrial curettage:Benign inactive endometrium suggestive of benign endometrial polyp.    09/27/2021: Presents for discussion of neoadjuvant chemotherapy.      INTERVAL HISTORY :    09/27/2021:     Patient presents today for a follow up visit to  discuss recent imaging results.   She was last seen here 08/19/2021. She is scheduled for a PET scan in 10/2021.     She has been doing well. She has been experiencing vaginal spotting, but no bleeding.   Patient was admitted for a dilatation and curettage, and hysteroscopy with myosure on 09/16/2021.     She describes lower back pain and pain between her shoulders. She does not sleep well.     She expresses concern today with her biopsy results and prognosis, as she describes a history of medical issues and complications, including heparin allergy, kidney issues, toe amputation, and finger amputation. No regular at home BP readings.     She takes ibuprofen for her back pain. She takes calcium and Vit D.       PAST MEDICAL HISTORY:    Past Medical History:   Diagnosis Date   . Arrhythmia    . CHF (congestive heart failure) (HCC)    . Colon cancer (HCC) 12/28/2016   . Colonic mass 11/28/2016    POSTOPERATIVE DIAGNOSIS 11/28/16 colonoscopy:  1) Colonoscopy with 3/4 circumference apple core lesion at full insertion of the colonoscope but likely distal to the Hepatic flexure 2) Hemorrhoids: mild internal 3) Mass: hot forceps biopsied times two without complications and tattooed times two proximal to the mass  Biopsy suspicious for intramucosal adenocarcinoma. Referral to Dr Oran Rein done and she   . Encounter for screening colonoscopy 12/05/2017   . History of blood transfusion    . 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 Hyperlipidemia    . 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 Screening for colon cancer 10/04/2016    POSTOPERATIVE DIAGNOSIS 11/28/16 colonoscopy:  1) Colonoscopy with 3/4 circumference apple core lesion at full insertion of the colonoscope but likely distal to the Hepatic flexure 2) Hemorrhoids: mild internal 3) Mass: hot forceps biopsied times two without complications and tattooed times two proximal to the mass  Biopsy suspicious  for intramucosal adenocarcinoma. Referral to Dr Oran Rein done and she   . Thyroid disease     Hyperparathyroid       PAST SURGICAL HISTORY:    Past Surgical History:   Procedure Laterality Date   . CARDIAC SURGERY  01/23/2016    AVR   . COLON SURGERY  2018    Hemicolectomy   . COLONOSCOPY N/A 05/04/2021    Procedure: COLONOSCOPY; W/ POSSIBLE BIOPSY & POLYPECTOMY;  Surgeon: Truitt Merle, MD;  Location: [facility] OR;  Service: [facility] Procedures;  Laterality: N/A;   . CORONARY ANGIOPLASTY     . DILATION AND CURETTAGE OF UTERUS N/A 09/16/2021    Procedure: DILATATION AND CURETTAGE; HYSTEROSCOPY with myosure;  Surgeon: Conni Elliot, MD;  Location: [facility] OR;  Service: [facility] Procedures;  Laterality: N/A;   . PROCEDURE N/A 01/27/2016    Procedure: AORTIC VALVE REPLACEMENT; INTRA AORTIC BALLOON INSERTION;  Surgeon: Otho Bellows, MD;  Location: [facility] OR;  Service: Cardiology;  Laterality: N/A;   . PROCEDURE Right 03/10/2016    Procedure: AMPUTATION TOE;  Surgeon: Chrystie Nose, DPM;  Location: [facility] OR;  Service: Podiatry;  Laterality: Right;   . PROCEDURE N/A 11/28/2016    Procedure: COLONOSCOPY; BIOPSY;  Surgeon: Gailen Shelter, MD;  Location: [facility] OR;  Service: [facility] Procedures;  Laterality: N/A;  ink spot marking apple core mass proximal lesion distal to hepatic flexure   . PROCEDURE Right 12/28/2016    Procedure: LAPAROPSCOPIC CONVERTED TO OPEN HAND ASSISTED RIGHT HEMI COLECTOMY;  Surgeon: Truitt Merle, MD;  Location: [facility] OR;  Service: [facility] Procedures;  Laterality: Right;  Dr. Oran Rein wants 3 hours   . PROCEDURE N/A 01/15/2018    Procedure: COLONOSCOPY; W/ POSSIBLE BIOPSY & POLYPECTOMY;  Surgeon: Truitt Merle, MD;  Location: [facility] OR;  Service: [facility] Procedures;  Laterality: N/A;   . PROCEDURE N/A 03/19/2018    Procedure: PARATHYROIDECTOMY; RECURRENT LARYNGEAL NERVE MONITORING, INTRAOPERATIVE PARATHYROID HORMONE;  Surgeon: Edmund Hilda, MD;  Location: [facility] OR;  Service: Ear Nose and Throat;  Laterality: N/A;   . TONSILLECTOMY     . TUBAL LIGATION   1980   . US GUIDED LYMPH NODE BIOPSY  09/15/2021    US GUIDED LYMPH NODE BIOPSY 09/15/2021 Jeanice Lim, MD [facility] [place] [facility]       CURRENT MEDICATIONS AND ALLERGIES:    Current Outpatient Medications   Medication Instructions   . acetaminophen (TYLENOL) 500 mg, Oral, Every 6 hours PRN   . aspirin 81 mg, Every morning   . atorvastatin (LIPITOR) 20 mg, Oral, Nightly   . CALCIUM ORAL 1 tablet, Oral, Every morning   . carvediloL (COREG) 6.25 mg, Oral, 2 times daily with meals   . cholecalciferol, vitamin D3, (VITAMIN D3 ORAL) 1 tablet, Oral, Every morning   . EPINEPHrine (EPIPEN) 0.3 mg, Intramuscular, Once   . ibuprofen (ADVIL) 200 mg, Oral, 2 times daily PRN   . letrozole (FEMARA) 2.5 mg, Oral, Daily       Allergies  Allergen Reactions   . Heparin Anaphylaxis     Possible HIT   . Sulfa (Sulfonamide Antibiotics) Rash     In ICU for 3 days post dose         PATIENT HISTORY:    Social History     Tobacco Use   . Smoking status: Never     Passive exposure: Yes   . Smokeless tobacco: Never   . Tobacco comments:     SBIRT 02/13/2018 [facility] Father smoked in the home.    Vaping Use   . Vaping Use: Never used   Substance Use Topics   . Alcohol use: No   . Drug use: No       Family History   Problem Relation Age of Onset   . Colon cancer Mother         dx at older age   . Heart disease Father    . Heart disease Sister    . Cancer Brother    . Heart disease Maternal Aunt    . Ovarian cancer Maternal Aunt    . Cancer Maternal Uncle    . Heart disease Maternal Uncle    . Breast cancer Paternal Aunt        REVIEW OF SYSTEMS:      Review of Systems - Oncology  A 14 point review of systems was obtained and is essentially negative for any symptoms other than as outlined in interval history.    PHYSICAL EXAM:    Physical Exam     BP 155/88 (BP Location: Right arm)   Pulse 69   Temp 36.1 ?C (96.9 ?F)   Ht 5' 3 (1.6 m)   Wt 260 lb 8 oz (118.2 kg)   LMP  (LMP Unknown)   SpO2 92%   BMI 46.15 kg/m?   Body surface area is 2.29 meters  squared.  Pain level: Pain Score: 0-No pain     LABS:    Admission on 09/16/2021, Discharged on 09/16/2021   Component Date Value   . Pathology Report 09/16/2021                      Value:Surgical Pathology Report                         Case: [id number]                                   Authorizing Provider:  Conni Elliot, MD     Collected:           09/16/2021 1116              Ordering Location:     [facility]    Received:            09/19/2021 5409                                     Surgery  Pathologist:           Huey Romans. Montes, MD                                                         Specimens:   A) - Endometrium, curettage                                                                         B) - Endometrium                                                                          . Final Pathologic Diagnos* 09/16/2021                      Value:This result contains rich text formatting which cannot be displayed here.   . Microscopic Description 09/16/2021                      Value:This result contains rich text formatting which cannot be displayed here.   Michaell Cowing Description 09/16/2021                      Value:This result contains rich text formatting which cannot be displayed here.   . Preoperative Diagnosis 09/16/2021                      Value:This result contains rich text formatting which cannot be displayed here.   Marland Kitchen Post-Operative Findings 09/16/2021                      Value:This result contains rich text formatting which cannot be displayed here.   . Clinical History 09/16/2021                      Value:This result contains rich text formatting which cannot be displayed here.   Lab Walk-In on 09/15/2021   Component Date Value   . CA 15-3 09/15/2021 26.0 (H)    . CEA 09/15/2021 11.1 (H)    . Sodium 09/15/2021 140    . Potassium 09/15/2021 3.79    . Chloride 09/15/2021 103.2    . CO2 - Carbon Dioxide  09/15/2021 25    . BUN 09/15/2021 12    . Creatinine 09/15/2021 0.73    . Glucose 09/15/2021 92    . Calcium 09/15/2021 9.3    . AST - Aspartate Aminotra* 09/15/2021 16    . ALT - Alanine Aminotrans* 09/15/2021 11    . Alkaline Phosphatase 09/15/2021 93    . Protein Total 09/15/2021 8.2    . Albumin 09/15/2021 4.1    . Bilirubin Total 09/15/2021 0.50    . Anion Gap 09/15/2021 11.8    .  Albumin/Globulin Ratio 09/15/2021 1.0 (L)    . Glomerular Filtration Ra* 09/15/2021 >90    . GFR Additional Info 09/15/2021     . BUN / Creatinine Ratio 09/15/2021 16.4    . Osmolality Calculation 09/15/2021 278.8    . WBC 09/15/2021 5.0    . RBC 09/15/2021 4.25    . Hemoglobin 09/15/2021 12.4    . HCT 09/15/2021 38.0    . MCV 09/15/2021 89.4    . [facility] 09/15/2021 29.3    . MCHC 09/15/2021 32.8    . RDW 09/15/2021 14.8    . Platelet Count 09/15/2021 212    . MPV 09/15/2021 7.1    . Neutrophils % 09/15/2021 51    . Lymphocytes % 09/15/2021 39    . Monocytes % 09/15/2021 7    . Eosinophils % 09/15/2021 1    . Basophils % 09/15/2021 1    . Neutrophils, Absolute 09/15/2021 2.5    . Lymphocytes, Absolute 09/15/2021 1.9    . Monocytes, Absolute 09/15/2021 0.4    . Eosinophils, Absolute 09/15/2021 0.1    . Basophils, Absolute 09/15/2021 0.1    Admission on 09/15/2021, Discharged on 09/15/2021   Component Date Value   . Protime 09/15/2021 13.2    . INR 09/15/2021 1.1    . APTT 09/15/2021 32    . Platelet Count 09/15/2021 203    . Pathology Report 09/15/2021                      Value:Surgical Pathology Report                         Case: [id number]                                   Authorizing Provider:  Truitt Merle, MD             Collected:           09/15/2021 0900              Ordering Location:     [facility]        Received:            09/15/2021 0900              Pathologist:           Irving Shows A. Montes, MD                                                         Specimen:    Lymph Node, Axillary, Left                                                                 . Final Pathologic Diagnos* 09/15/2021                      Value:This result contains rich text formatting which cannot be displayed here.   . Diagnosis Comment 09/15/2021  Value:This result contains rich text formatting which cannot be displayed here.   . Microscopic Description 09/15/2021                      Value:This result contains rich text formatting which cannot be displayed here.   Michaell Cowing Description 09/15/2021                      Value:This result contains rich text formatting which cannot be displayed here.   . Intraoperative Consultat* 09/15/2021                      Value:This result contains rich text formatting which cannot be displayed here.   . Preoperative Diagnosis 09/15/2021                      Value:This result contains rich text formatting which cannot be displayed here.   Marland Kitchen Post-Operative Findings 09/15/2021                      Value:This result contains rich text formatting which cannot be displayed here.   . Clinical History 09/15/2021                      Value:This result contains rich text formatting which cannot be displayed here.          IMAGING:  Ultrasound-guided lymph node biopsy    Result Date: 09/15/2021  IMPRESSION: 1. Successful, uncomplicated ultrasound-guided core biopsy of left axillary lymph node.  Final pathology is pending. =============================== Dictated by: Jeanice Lim Electronically Signed by: Jeanice Lim on 09/15/2021 5:34 PM Report ID#: [id number]        Staging PET CT scan pending.    Echo 2D complete w doppler wo contrast 09/01/2021:   IMPRESSION  1. Global systolic function: Left ventricular EF is 60 %.  2. 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 It is well seated and does not rock. Transvalvular gradients are within expected limits for the valve. Mean  gradient 7.96 mm hg. There is no aortic regurgitation.  3. The mitral valve has mild regurgitation.  4. Compared to  the prior study in 2021, EF is now 60% from 69%      ASSESSMENT AND PLAN:  Encounter Diagnoses   Name Primary?   . Malignant neoplasm of left breast in female, estrogen receptor positive, unspecified site of breast (HCC) Yes   . HER2-positive carcinoma of breast (HCC)    . Menopausal syndrome on hormone replacement therapy    . Long term (current) use of aromatase inhibitors    . Non-ischemic cardiomyopathy (HCC)      66 year old female with newly diagnosed locally advanced left breast ductal carcinoma, grade 3, with axillary involvement by biopsy, ER positive PR positive, HER2/neu 3+ by immunohistochemistry, clinical stage T1N 1-2 MX.  Per NCCN guidelines plan is to proceed with neoadjuvant trastuzumab and pertuzumab in combination with Taxotere and carboplatin.    Patient has been on neoadjuvant endocrine therapy with aromatase inhibitors since diagnosis 4 weeks ago.  A baseline bone density study is pending.  Staging PET/CT scan is pending.    Discussed in detail plans for neoadjuvant chemotherapy with combination based on anti-HER2 agents with Taxotere and carboplatin (most recent echocardiogram shows LVEF of 60%) for a total of 6 cycles on a every 3 week regimen with G-CSF support.  This will be followed by definitive  breast surgery, and continued adjuvant trastuzumab and pertuzumab therapy for a total of 52 weeks.  Patient would also likely be a candidate for adjuvant radiation therapy given significant axillary involvement consistent with metastatic disease.    Side effects related to taxol and carboplatin shared including myelosuppression , ie neutropenia and increased propensity for infection, anemia and fatigue, rarely thrombocytopenia and bleeding., hypersensitivity reaction to infusion; taxane related myalgias, neuropathy, fluid retention,proarrythmic risk.    Side effects related to antibiotic therapy including cardiotoxicity, hypersensitivity reaction, rash flulike syndrome fatigue were also shared and  particularly diarrhea related to pertuzumab.    Explained rationale and concept of Port-A-Cath placement for central line venous access for chemotherapy administration.  Patient is agreeable and will be referred back to Dr.Ogao for port placement.    Patient Instructions     Instructions and Staff Orders :   Plan start of Taxotere + Carboplatin + Herceptin + Perjeta after portacath placement.    Lab studies : cbc, cmp, CA 15-3, CEA    Imaging : Awaiting PET Scan on 10/12/2021  Dexa Scan : Bone Density study : Post menopausal on Aromatase inhibitors     Referral: Dr Oran Rein for portacath placement    Medications/Rx : No    Follow up : After May 3rd to discuss PET-CT results and or after first dose of Taxotere + Carboplatin + Herceptin + Perjeta.        I have spent a total of 40  minutes on this patient's care today.  This time includes face-to-face time with the patient as well as time spent reviewing patient records, coordinating/communicating with care teams and documenting the patient's visit.    Scribe Attestation   Sign Off: Scribe: By signing my name below, I, Leontine Locket, Scribe, attest that this documentation has been prepared under the direction and in the presence of Dr. Nadeen Landau.    Attending: I, Dr. Nadeen Landau, personally performed the services described in this documentation. All medical record entries made by the scribe were at my direction and in my presence. I have reviewed the chart and discharge instructions (if applicable) and agree that the record reflects my personal performance and is accurate and complete. Dr. Nadeen Landau.      Lindley Magnus, MD

## 2021-10-03 NOTE — Telephone Encounter (Signed)
Port placement has been scheduled for 10/11/21. RM

## 2021-10-03 NOTE — Telephone Encounter (Signed)
Port placement has been scheduled for 10/11/21. RM

## 2021-10-03 NOTE — Telephone Encounter (Signed)
MSG FROM ANSWERING SERVICE    DATE:  09/30/21 @ 8:47am:    FROM:  Talar Fraley    TELEPHONE #: [phone]    Message:  Following up.  Nobody called me back to schedule

## 2021-10-04 NOTE — Procedures (Signed)
[facility] PRE-OP INSTRUCTIONS    Dear Brandy [name],    It was a pleasure to talk to you today.    [facility] PRE-OP INSTRUCTIONS    Date of Surgery: Tuesday Oct 11, 2021    If you are going home the same day as surgery, please arrange for an adult to drive you home and be with you for 24 hours after your surgery. Please make sure you have accurate contact information for the person that will be picking you up.    Your Surgeon's office will contact you with your arrival time the working day prior to your procedure.    Arrive at the day surgery entrance on [address] ( to the right of the family birthing center).  Please wear a mask.  At this time you are  allowed visitors in the surgery waiting area.     Do not EAT anything after midnight the night prior to your surgery.    ? Your anesthesiologist will allow you to have clear liquids only (liquids such as water, black coffee, tea, Gatorade, apple juice, cranberry juice, or coconut water up to 2 hours before your scheduled arrival time. Avoid orange juice, and other pulpy juices, energy drinks, alcohol, and coffee with dairy products.      If you develop a fever/chills, cough, shortness of breath, excessive fatigue, muscle and joint pain, headaches, loss of taste/smell, nasal congestion, nausea/vomiting, and diarrhea.  In addition if you have had close contact to a person with known Covid-19 infection within the past 14 days.  Please notify your primary care provider, your surgeon or the pre op clinic as soon as possible if you have any of these symptoms.      Before leaving for the hospital:    . Take a soapy shower or bath and brush teeth.  Pre Surgery CHG Shower Instructions  Chlorhexidine Gluconate (CHG) 2% shower kits are being provided for pre-operative home showers. Common brand names for this soap are Hibiclens?, Chlorascrub? and Hex-a-Clens?. The purpose of the pre-operative shower routine is to reduce the risk of surgical site  infection.    Please read and follow instructions provided.   DO NOT use Chlorhexidine Gluconate (CHG) soap if you have had a previous skin or allergic reaction to any CHG product in the past.  Please notify your care team for alternative shower method.   DO NOT use Chlorhexidine Gluconate (CHG) soap around mucous membranes or anywhere above your chin (i.e. mouth, nose, eyes or ears)  STOP CHG showers if you develop any skin irritation and continue your preoperative shower with regular antimicrobial soap. If you develop a skin reaction, please notify your care team.    Preoperative shower routine should be done once a day for two days in a row; preferably the night before surgery and the morning of surgery.   Shower Instructions  1. Rinse body, shampoo hair, wash face with regular soap then rinse your body completely  2. Wet a new shower mitt or clean washcloth and apply CHG soap   3. Turn off water before washing with CHG soap to prevent rinsing it off too soon.    4. Rub soap filled cloth over your entire body from chin down for 2 minutes, avoid scrubbing too hard. Pay particular attention to surgical site area and surrounding skin  5. Spine Surgery patients should have help washing their back.  6. Turn on water and rinse the soap off completely  7. Do not use regular soap after washing with CHG soap  8. Do not shave or remove body hair during the pre op period. Facial shaving is permitted  9. Do not apply any skin care products or hair conditioner after CHG shower   10. Pat your skin dry with a freshly-laundered towel after each shower  11. Wear clean bedclothes and sleep with clean bed linens the night before surgery.  . Please do not wear eye make-up.  Marland Kitchen Please wear NO jewelry and remove all piercings.   . Please wear casual, non-restrictive clothing.  . Do not bring credit cards, cash, medication, jewelry, or other valuables.    ? Please bring your glasses, dentures and a case for these items. Do not wear  contact lenses.    Additional instructions: Dr.Gonsowski is your anesthesiologist.  Please take all prescribed morning medications. It is not necessary to take vitamins and supplements.  Please stop Motrin, Aspirin, Ibuprofen, Aleve and any other medicines or supplements that may thin the blood, as directed by your surgeon.     Pre-op Clinic phone: [phone]  [facility] RN

## 2021-10-05 ENCOUNTER — Encounter: Admit: 2021-10-05 | Discharge: 2021-10-05

## 2021-10-05 NOTE — Progress Notes (Signed)
HOME MEDICATION LIST REVIEWED BY PHARMACIST        Prior To Admission Home Medication list in Epic was prepared during this inpatient visit by a team member other than a pharmacist in a best attempt to document medications that the patient takes in the outpatient setting:      Home Medications     Med List Status: [facility] Pharmacist Review Complete Set By: Gardiner Barefoot, RPH at 10/04/2021  4:28 PM            acetaminophen (TYLENOL) 500 MG tablet     Take 500 mg by mouth every 6 hours as needed for Pain.     aspirin 81 MG EC tablet     Take 1 tablet by mouth every morning.     Patient not taking: Reported on 08/19/2021     atorvastatin (LIPITOR) 20 mg tablet     Take 1 tablet by mouth every night at bedtime.     Patient taking differently: Take 20 mg by mouth every night at bedtime.     calcium carbonate/vitamin D3 (CALCIUM 600 + D,3, ORAL)     Take 1 tablet by mouth every morning.     carvediloL (COREG) 6.25 mg tablet     Take 1 tablet by mouth 2 times daily with meals.     Patient taking differently: Take 6.25 mg by mouth 2 times daily with meals.     cholecalciferol, vitamin D3, (VITAMIN D3) 50 mcg (2,000 unit) Tab tablet     Take 2,000 units by mouth every morning.     EPINEPHrine (EPIPEN) 0.3 mg/0.3 mL AtIn injection     Inject 0.3 mL into the muscle once for 1 dose.     letrozole (FEMARA) 2.5 mg tablet     Take 1 tablet by mouth once daily.     Patient taking differently: Take 2.5 mg by mouth every night at bedtime.          The pharmacist has reviewed the list, without direct patient communication, and has not identified any concerns that are anticipated to cause harm in a typical patient.     The following medications are non-formulary at [facility]:    Letrozole    If any of the above non-formulary medications are to be ordered for inpatient use please have the patient supply them, and send to the inpatient pharmacy for identification and barcoding.    Inaccuracies may still exist, and the home  medication list has not been clinically evaluated for appropriate indications; please use clinical judgment when reconciling the list.    Signed by: Gardiner Barefoot, Pharmacist

## 2021-10-10 NOTE — Telephone Encounter (Signed)
I spoke with patient and confirmed their procedure on 10/11/21 with a check in time of 2:00pm at [facility]. They are aware that they are not to eat breakfast the morning of procedure and that they may have clear fluids up until 2 hours prior to check in time - but NO dairy products. They have arranged for someone to drive them home after procedure. All questions were answered.

## 2021-10-11 ENCOUNTER — Ambulatory Visit: Admit: 2021-10-11

## 2021-10-11 ENCOUNTER — Ambulatory Visit: Admit: 2021-10-11 | Discharge: 2021-11-10

## 2021-10-11 ENCOUNTER — Ambulatory Visit

## 2021-10-11 MED ORDER — propofoL (DIPRIVAN) injection
10 | INTRAVENOUS | Status: DC | PRN
Start: 2021-10-11 — End: 2021-10-11
  Administered 2021-10-11: 22:00:00 10 mg/mL via INTRAVENOUS

## 2021-10-11 MED ORDER — fentaNYL (PF) (SUBLIMAZE) injection 25-50 mcg
50 | INTRAMUSCULAR | Status: DC | PRN
Start: 2021-10-11 — End: 2021-10-11

## 2021-10-11 MED ORDER — bupivacaine 0.25 %-EPINEPHrine 1:200,000 injection
0.25 | INTRAMUSCULAR | Status: DC | PRN
Start: 2021-10-11 — End: 2021-10-11
  Administered 2021-10-11: 23:00:00 0.25 %-1:200,000 via SUBCUTANEOUS

## 2021-10-11 MED ORDER — ondansetron (ZOFRAN) injection
4 | INTRAMUSCULAR | Status: DC | PRN
Start: 2021-10-11 — End: 2021-10-11
  Administered 2021-10-11: 22:00:00 4 mg/2 mL via INTRAVENOUS

## 2021-10-11 MED ORDER — famotidine (PEPCID) tablet 20 mg
20 | Freq: Once | ORAL | Status: AC
Start: 2021-10-11 — End: 2021-10-11

## 2021-10-11 MED ORDER — midazolam (PF) (VERSED) injection 0.5-2 mg
1 | INTRAMUSCULAR | Status: DC | PRN
Start: 2021-10-11 — End: 2021-10-11
  Administered 2021-10-11: 22:00:00 1 mg via INTRAVENOUS

## 2021-10-11 MED ORDER — ondansetron (ZOFRAN) injection 4 mg
4 | Freq: Four times a day (QID) | INTRAMUSCULAR | Status: DC | PRN
Start: 2021-10-11 — End: 2021-10-11

## 2021-10-11 MED ORDER — diphenhydrAMINE (BENADRYL) injection 12.5-25 mg
50 | INTRAMUSCULAR | Status: DC | PRN
Start: 2021-10-11 — End: 2021-10-11

## 2021-10-11 MED ORDER — meperidine (PF) (DEMEROL) injection 12.5 mg
25 | INTRAMUSCULAR | Status: DC | PRN
Start: 2021-10-11 — End: 2021-10-11
  Administered 2021-10-11 (×2): 25 mg via INTRAVENOUS

## 2021-10-11 MED ORDER — ondansetron (ZOFRAN) 4 mg/2 mL injection
4 | INTRAMUSCULAR | Status: AC
Start: 2021-10-11 — End: ?

## 2021-10-11 MED ORDER — famotidine (PF) (PEPCID) 20 mg in 0.9% NaCl IVPB Premix
20 | Freq: Once | INTRAVENOUS | Status: AC
Start: 2021-10-11 — End: 2021-10-11
  Administered 2021-10-11: 22:00:00 20 mg via INTRAVENOUS

## 2021-10-11 MED ORDER — BUPivacaine (PF) (MARCAINE) 0.25 % (2.5 mg/mL) injection
0.25 | INTRAMUSCULAR | Status: AC
Start: 2021-10-11 — End: ?

## 2021-10-11 MED ORDER — heparin, porcine (PF) (HEPALEAN) 10 unit/mL syringe
10 | INTRAVENOUS | Status: AC
Start: 2021-10-11 — End: ?

## 2021-10-11 MED ORDER — ceFAZolin (ANCEF) in D5W IVPB Premix
2 | INTRAVENOUS | Status: DC | PRN
Start: 2021-10-11 — End: 2021-10-11
  Administered 2021-10-11: 22:00:00 2 gram/50 mL via INTRAVENOUS

## 2021-10-11 MED ORDER — dexAMETHasone (DECADRON) injection
4 | INTRAMUSCULAR | Status: DC | PRN
Start: 2021-10-11 — End: 2021-10-11
  Administered 2021-10-11: 22:00:00 4 mg/mL via INTRAVENOUS

## 2021-10-11 MED ORDER — propofoL (DIPRIVAN) 10 mg/mL injection
10 | INTRAVENOUS | Status: AC
Start: 2021-10-11 — End: ?

## 2021-10-11 MED ORDER — ceFAZolin (ANCEF) 2 g in D5W IVPB Premix
2 | INTRAVENOUS | Status: AC | PRN
Start: 2021-10-11 — End: 2021-10-11

## 2021-10-11 MED ORDER — EPINEPHrine HCl (PF) (ADRENALIN) 1 mg/mL (1 mL) injection
1 | INTRAMUSCULAR | Status: AC
Start: 2021-10-11 — End: ?

## 2021-10-11 MED ORDER — prochlorperazine (COMPAZINE) injection 10 mg
10 | Freq: Once | INTRAMUSCULAR | Status: DC | PRN
Start: 2021-10-11 — End: 2021-10-11

## 2021-10-11 MED ORDER — sodium chloride 0.9% (NS PF) injection
INTRAMUSCULAR | Status: DC | PRN
Start: 2021-10-11 — End: 2021-10-11
  Administered 2021-10-11: 23:00:00 via INTRAVENOUS

## 2021-10-11 MED ORDER — sodium citrate-citric acid (BICITRA) oral solution 30 mL
500-334 | Freq: Once | ORAL | Status: AC
Start: 2021-10-11 — End: 2021-10-11
  Administered 2021-10-11: 22:00:00 500-334 mL via ORAL

## 2021-10-11 MED ORDER — dexAMETHasone (PF) (DECADRON) 10 mg/mL syringe
10 | INTRAMUSCULAR | Status: AC
Start: 2021-10-11 — End: ?

## 2021-10-11 MED ORDER — meperidine (PF) (DEMEROL) 25 mg/mL injection
25 | INTRAMUSCULAR | Status: AC
Start: 2021-10-11 — End: ?

## 2021-10-11 MED ORDER — fentaNYL (PF) (SUBLIMAZE) 50 mcg/mL injection
50 | INTRAMUSCULAR | Status: AC
Start: 2021-10-11 — End: ?

## 2021-10-11 MED ORDER — fentaNYL (PF) (SUBLIMAZE) injection
50 | INTRAMUSCULAR | Status: DC | PRN
Start: 2021-10-11 — End: 2021-10-11
  Administered 2021-10-11: 22:00:00 50 mcg/mL via INTRAVENOUS

## 2021-10-11 MED ORDER — sodium chloride 0.9 % (NS) infusion
INTRAVENOUS | Status: DC
Start: 2021-10-11 — End: 2021-10-11
  Administered 2021-10-11 (×2): via INTRAVENOUS
  Administered 2021-10-11: 23:00:00
  Administered 2021-10-11: 23:00:00 via INTRAVENOUS

## 2021-10-11 NOTE — H&P (Signed)
HISTORY OF PRESENT ILLNESS:    Brandy [name] is a 66 y.o. female referred for port placement.  Patient was recently diagnosed with advanced left-sided breast cancer.  She has met with medical oncology and is appropriate for neoadjuvant chemotherapy.  She is in need of vascular access for infusion therapy.      MEDICAL / SURGICAL HISTORY:  Past Medical History:   Diagnosis Date    Arrhythmia     CHF (congestive heart failure) (HCC)     Colon cancer (HCC) 12/28/2016    Colonic mass 11/28/2016    POSTOPERATIVE DIAGNOSIS 11/28/16 colonoscopy:  1) Colonoscopy with 3/4 circumference apple core lesion at full insertion of the colonoscope but likely distal to the Hepatic flexure 2) Hemorrhoids: mild internal 3) Mass: hot forceps biopsied times two without complications and tattooed times two proximal to the mass  Biopsy suspicious for intramucosal adenocarcinoma. 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 Referral to Dr Oran Rein done and she    Thyroid disease     Hyperparathyroid        Past Surgical History:   Procedure Laterality Date    CARDIAC SURGERY  01/23/2016    AVR    COLON SURGERY  2018    Hemicolectomy    COLONOSCOPY N/A 05/04/2021    Procedure: COLONOSCOPY; W/ POSSIBLE BIOPSY & POLYPECTOMY;  Surgeon: Truitt Merle, MD;  Location: [facility] OR;  Service: [facility] Procedures;   Laterality: N/A;    CORONARY ANGIOPLASTY      DILATION AND CURETTAGE OF UTERUS N/A 09/16/2021    Procedure: DILATATION AND CURETTAGE; HYSTEROSCOPY with myosure;  Surgeon: Conni Elliot, MD;  Location: [facility] OR;  Service: [facility] Procedures;  Laterality: N/A;    PROCEDURE N/A 01/27/2016    Procedure: AORTIC VALVE REPLACEMENT; INTRA AORTIC BALLOON INSERTION;  Surgeon: Otho Bellows, MD;  Location: [facility] OR;  Service: Cardiology;  Laterality: N/A;    PROCEDURE Right 03/10/2016    Procedure: AMPUTATION TOE;  Surgeon: Chrystie Nose, DPM;  Location: [facility] OR;  Service: Podiatry;  Laterality: Right;    PROCEDURE N/A 11/28/2016    Procedure: COLONOSCOPY; BIOPSY;  Surgeon: Gailen Shelter, MD;  Location: [facility] OR;  Service: [facility] Procedures;  Laterality: N/A;  ink spot marking apple core mass proximal lesion distal to hepatic flexure    PROCEDURE Right 12/28/2016    Procedure: LAPAROPSCOPIC CONVERTED TO OPEN HAND ASSISTED RIGHT HEMI COLECTOMY;  Surgeon: Truitt Merle, MD;  Location: [facility] OR;  Service: [facility] Procedures;  Laterality: Right;  Dr. Oran Rein wants 3 hours    PROCEDURE N/A 01/15/2018    Procedure: COLONOSCOPY; W/ POSSIBLE BIOPSY & POLYPECTOMY;  Surgeon: Truitt Merle, MD;  Location: [facility] OR;  Service: [facility] Procedures;  Laterality: N/A;    PROCEDURE N/A 03/19/2018    Procedure: PARATHYROIDECTOMY; RECURRENT LARYNGEAL NERVE MONITORING, INTRAOPERATIVE PARATHYROID HORMONE;  Surgeon: Edmund Hilda, MD;  Location: [facility] OR;  Service: Ear Nose and Throat;  Laterality: N/A;    TONSILLECTOMY      TUBAL LIGATION  1980    US GUIDED LYMPH NODE BIOPSY  09/15/2021    US GUIDED LYMPH NODE BIOPSY 09/15/2021 Jeanice Lim, MD [facility] [place] IM[facility]        CURRENT MEDS:  Current Facility-Administered Medications   Medication Dose Route Frequency Provider Last Rate Last Admin    ceFAZolin (ANCEF) 2 g in D5W IVPB Premix  2 g Intravenous Pre-op ABX Truitt Merle, MD        sodium chloride 0.9 % (NS) infusion   Intravenous Continuous Truitt Merle, MD             ALLERGIES:  Heparin and Sulfa (sulfonamide antibiotics)      Family History   Problem Relation Age of Onset    Colon cancer Mother         dx at older age    Heart disease Father     Heart disease Sister     Cancer Brother     Heart disease Maternal Aunt     Ovarian cancer Maternal Aunt     Cancer Maternal Uncle     Heart disease Maternal Uncle     Breast cancer Paternal Aunt         Social History     Socioeconomic History    Marital status: Married   Tobacco Use    Smoking status: Never     Passive exposure: Yes    Smokeless tobacco: Never    Tobacco comments:     SBIRT 02/13/2018 [facility] Father smoked in the home.    Vaping Use    Vaping Use: Never used   Substance and Sexual Activity    Alcohol use: No    Drug use: No    Sexual activity: Not Currently     Partners: Male     Birth control/protection: Post-menopausal        Review of Systems  Systems reviewed, pertinent positives and negatives discussed in HPI.        PHYSICAL EXAM  There were no vitals filed for this visit.   There is no height or weight on file to calculate BMI.    General: no acute distress, alert and oriented x3   HEENT: no scleral icterus   Abdomen: soft, non tender  Musculoskeletal/extremities: well perfused bilaterally  Neurologic: no focal deficits.    LABS: Reviewed    Lab Results   Component Value Date    WHITEBLOODCE 5.0 09/15/2021    HGB 12.4 09/15/2021    HCT 38.0 09/15/2021    MCV 89.4 09/15/2021    LABPLAT 212 09/15/2021     Lab Results   Component Value Date    NA 140 09/15/2021    K 3.79 09/15/2021    CL 103.2 09/15/2021    CO2 25 09/15/2021    BUN 12 09/15/2021    CREATININES 0.73 09/15/2021    BCR 16.4 09/15/2021    GLU 92 09/15/2021    CALCIUM 9.3 09/15/2021    AST 16 09/15/2021    ALT 11 09/15/2021    ALKPHOS 93 09/15/2021    PROT 8.2 09/15/2021    ALBUMIN 4.1 09/15/2021    BILITOT 0.50 09/15/2021    ANIONGAP 11.8 09/15/2021    LABGLOM 79 02/28/2021    GFRADDINFO  09/15/2021      Comment:      For additional information,  please visit  the [employer] Disease Education Program (NKDEP) at [email or web address]    OSMOLALITY 278.8 09/15/2021    AGRATIO 1.0 (L) 09/15/2021       IMAGING: Imaging reviewed in the PACS system, agree with interpretation as reported    Ultrasound-guided lymph node biopsy  Narrative: EXAMINATION:  US GUIDED LYMPH NODE BIOPSY, [id number]    HISTORY:  Left breast canceer    COMPARISON:  None.    LOCATION AND DESCRIPTION OF LESION  Enlarged lymph node in the left axilla measuring up to 43 mm x 15 mm x 33 mm.    TECHNIQUE:  TIME VWU:[id number] in conjunction with the technologist in attendance.    TECHNIQUE:  Informed consent was obtained from the patient after full explanation of the procedure, risks, benefits and alternatives and the patient agreed both in written form and verbally to proceed.    [facility] Renette Butters moment was performed.  Vital signs including blood pressure, heart rate, and oxygen saturation were monitored by an interventional radiologist nurse.  Please see separate documentation.    Using sterile technique and ultrasound guidance following 1% lidocaine local infiltration for local analgesia.  Multiple core biopsies were obtained utilizing of the lesion were obtained using coaxial needletechnique and direct sonographic visualization.    Specimens were gathered for processing by Pathology staff for analysis.    There was no immediate complication identified.  Blood loss was minimal.  A sterile dressing was applied to the percutaneous site.    COMPLICATIONS: No immediate complication.    FINDINGS:  Multiple samples were obtained.  Impression: IMPRESSION:  1. Successful, uncomplicated ultrasound-guided core biopsy of left axillary lymph node.  Final pathology is pending.    ===============================    Dictated by: Jeanice Lim  Electronically Signed by: Jeanice Lim on 09/15/2021 5:34 PM    Report ID#: [id number]        ASSESSMENT & PLAN    Patient is in need of vascular access for  administration of scheduled infusion therapy. I discussed the utility, risks, benefits, and alternatives of a right subclavian tunneled central venous catheter with subcutaneous port placement with the patient. The procedure was described in detail and post operative expectations were reviewed.  A detailed PARQ conference was completed. Will schedule for next available. Thank you for allowing me to participate in the care of this patient.

## 2021-10-11 NOTE — OR Nursing (Signed)
Pt educated on Bair Paws, usual length of stay post op, pain control, and constipation with pain meds post op. Pt states understanding.

## 2021-10-11 NOTE — Op Note (Signed)
Surgeon: Truitt Merle, MD, FACS    Anesthesia: Rennis Chris, MD    Assistant: Marcial Pacas, PA    Preoperative Diagnosis: Left-sided breast cancer    Postoperative Diagnosis: Same    Operation: Ultrasound-guided vascular access, insertion of tunneled central venous catheter with subcutaneous port under fluoroscopy guidance    Indications: This is a 66 year old female in need for long-term central venous access for anticipated infusion therapy.  Patient was recently diagnosed with left breast cancer and is now with plans for neoadjuvant chemotherapy.    Findings: Appropriate placement of right subclavian tunneled central venous catheter with subcutaneous port noting satisfactory drawback and flushing.     Description of Technique:    After induction of anesthesia, the patient's neck and chest area was prepped and draped in the usual sterile fashion. A surgical pause was performed confirming the patient and procedure.  The patient was positioned in Trendelenburg and local anesthesia was used to infiltrate the skin and subcutaneous tissue on the chest wall just below the clavicle.  With ultrasound guidance, involved anatomical structures were identified and a large bore needle was placed into the right subclavian vein. Satisfactory drawback of appropriate colored blood. A wire was then placed using Seldinger technique. Fluoroscopy confirmed wire placement into the superior vena cava.     An incision was then made on the chest wall just inferior to the percutaneous insertion site of the wire. A subcutaneous pocket was then created facilitating subcutaneous port placement. A tunneling device was then used to pass the flushed catheter from the wire site into the subcutaneous port site through the subcutaneous tissue. A dilating sheath catheter was then passed over the wire and catheter was then fed through this sheath and into the superior vena cava. This was visualized on fluoroscopy with appropriate placement of  the tip of the catheter at the atrial-caval junction. The catheter sheath was then removed and catheter was trimmed to appropriate size.  It was secured to the subcutaneous port which was subsequently sutured down to the anterior chest wall. Heparinized flush was placed through the port and demonstrated satisfactory drawback and flush. The skin incision was then closed in 2 layers and dressings applied.    The patient tolerated the procedure well, there were no complications.  Sponge, needle, and instrument counts were correct.  The patient was taken to the post operative recovery unit in stable condition.        Disposition: Patient is discharged to home after all criteria met in the PACU.     Final Diagnosis: Status post right subclavian tunneled central venous catheter with subcutaneous port, breast cancer    Follow up: Patient is to follow up with me as directed on the AVS.     Condition of patient: Stable

## 2021-10-11 NOTE — Anesthesia Post-Procedure Evaluation (Signed)
Patient Name: Brandy [name]  Procedures performed: Procedure(s):  RIGHT SUBCLAVIAN PORT PLACEMENT    Last Vitals:   Vitals Value Taken Time   BP 143/81 10/11/21 1609   Pulse 74 10/11/21 1609   Resp 11 10/11/21 1609   SpO2 98 % 10/11/21 1609   Temp 36.2 ?C 10/11/21 1608   Vitals shown include unvalidated device data.    Planned Anesthesia Type: general  Final Anesthesia Type: general  Patients Current Location: PACU  Level of Consciousness: awake  Post Procedure Pain:adequate analgesia  Airway: Patent  Respiratory Status: face mask  Cardio Status: hemodynamically stable  Hydration: adequately hydrated  PONV prophylaxis Ordered  PONV? NO  no anesthesia complication,       planned opioid use           The patient was able to participate in the post op evaluation    Comments:      Tedra Senegal, MD  4:09 PM

## 2021-10-11 NOTE — Anesthesia Pre-Procedure Evaluation (Signed)
Anesthesia Evaluation     Patient summary reviewed and Nursing notes reviewed      Airway   Mallampati: IIobesity    Dental - normal exam     Pulmonary - negative ROS and normal exam    breath sounds clear to auscultation  Cardiovascular - normal exam  (+) CHF,     ECG reviewed  Rhythm: regular  Rate: normal    Neuro/Psych - negative ROS     GI/Hepatic/Renal    (+) obesity    Endo/Other - negative ROS    Musculoskeletal - negative ROS                   Anesthesia Plan    ASA 3     general     intravenous induction         Anesthetic plan, risks and benefits discussed with patient.  Consenting person understands and agrees to proceed. anesthesia consent form used. PARQ.    Use of blood products discussed with - consented to blood products

## 2021-10-11 NOTE — OR Nursing (Signed)
Discharge instructions given verbally to pt with good understanding. Questions answered. Copy of written instructions and information regarding procedure given. Tolerating fluids. Vital signs stable. Pt ambulatory. Voided. Pt dcd with husband.

## 2021-10-12 ENCOUNTER — Inpatient Hospital Stay: Admit: 2021-10-12 | Discharge: 2021-11-11 | Attending: Medical Oncology

## 2021-10-12 ENCOUNTER — Inpatient Hospital Stay: Admit: 2021-10-12 | Attending: Medical Oncology

## 2021-10-12 DIAGNOSIS — C50612 Malignant neoplasm of axillary tail of left female breast: Secondary | ICD-10-CM

## 2021-10-12 DIAGNOSIS — C50912 Malignant neoplasm of unspecified site of left female breast: Secondary | ICD-10-CM

## 2021-10-12 MED ORDER — fludeoxyglucose F-18 (FDG) intravenous contrast solution 11.6 millicurie
20 | Freq: Once | INTRAVENOUS | Status: AC
Start: 2021-10-12 — End: 2021-10-12
  Administered 2021-10-12: 17:00:00 20 via INTRAVENOUS

## 2021-10-12 NOTE — Progress Notes (Signed)
TCHP (DOCETAXEL(75) / CARBOPLATIN(6) + TRASTUZUMAB + PERTUZUMAB), 21 DAY CYCLES - BREAST authorized.  Port placed  PET completed  Drug teach TBD around tx date.  ECHO completed 3/23, okay per Dr. Nadeen Landau.  Jacki Cones notified.

## 2021-10-19 ENCOUNTER — Other Ambulatory Visit: Admit: 2021-10-19

## 2021-10-19 ENCOUNTER — Encounter: Admit: 2021-10-19

## 2021-10-19 DIAGNOSIS — Z170 Estrogen receptor positive status [ER+]: Secondary | ICD-10-CM

## 2021-10-19 DIAGNOSIS — C50912 Malignant neoplasm of unspecified site of left female breast: Secondary | ICD-10-CM

## 2021-10-19 LAB — COMPREHENSIVE METABOLIC PANEL
ALT - Alanine Aminotransferase: 14 IU/L (ref 5–33)
AST - Aspartate Aminotransferase: 17 IU/L (ref 5–32)
Albumin/Globulin Ratio: 1 — ABNORMAL LOW (ref 1.2–2.2)
Albumin: 4 g/dL (ref 3.5–5.0)
Alkaline Phosphatase: 87 IU/L (ref 35–105)
Anion Gap: 12.1 mmol/L (ref 9.0–18.0)
BUN / Creatinine Ratio: 21.3 — ABNORMAL HIGH (ref 12.0–20.0)
BUN: 17 mg/dL (ref 8–20)
Bilirubin Total: 0.7 mg/dL (ref 0.10–1.70)
CO2 - Carbon Dioxide: 24 mmol/L (ref 17–27)
Calcium: 9.6 mg/dL (ref 8.6–10.6)
Chloride: 103.9 mmol/L (ref 101.0–111.0)
Creatinine: 0.8 mg/dL (ref 0.60–1.30)
Glomerular Filtration Rate Estimate (Female): 81 mL/min/{1.73_m2} (ref >=60)
Glucose: 89 mg/dL (ref 74–106)
Osmolality Calculation: 280.4 mOsm/kg (ref 275.0–300.0)
Potassium: 3.68 mmol/L (ref 3.50–5.10)
Protein Total: 7.9 g/dL (ref 6.4–8.2)
Sodium: 140 mmol/L (ref 135–145)

## 2021-10-19 LAB — CBC WITH AUTO DIFFERENTIAL
Basophils %: 1 % (ref 0–2)
Basophils, Absolute: 0 10*3/ÂµL (ref 0.0–0.1)
Eosinophils %: 2 % (ref 0–5)
Eosinophils, Absolute: 0.1 10*3/ÂµL (ref 0.0–0.4)
HCT: 38.8 % (ref 35.0–48.0)
Hemoglobin: 12.5 g/dL (ref 11.7–16.5)
Lymphocytes %: 41 % — ABNORMAL HIGH (ref 20–40)
Lymphocytes, Absolute: 1.8 10*3/ÂµL (ref 1.0–4.0)
MCH: 29.2 pg (ref 28.3–33.3)
MCHC: 32.3 g/dL — ABNORMAL LOW (ref 32.5–36.0)
MCV: 90.5 fL (ref 81.0–100.0)
MPV: 7.6 fL (ref 6.9–10.0)
Monocytes %: 8 % (ref 2–12)
Monocytes, Absolute: 0.4 10*3/ÂµL (ref 0.2–1.0)
Neutrophils %: 49 % — ABNORMAL LOW (ref 50–74)
Neutrophils, Absolute: 2.2 10*3/ÂµL (ref 2.0–7.4)
Platelet Count: 216 10*3/ÂµL (ref 150–405)
RBC: 4.28 10*6/ÂµL (ref 3.80–5.60)
RDW: 15.5 % (ref 11.7–16.1)
WBC: 4.5 10*3/ÂµL (ref 4.5–11.0)

## 2021-10-19 LAB — CA 15-3 (SLM): CA 15-3: 26.5 U/mL — ABNORMAL HIGH (ref <=25.0)

## 2021-10-19 LAB — CEA: CEA: 4.8 ng/mL (ref 0.0–5.0)

## 2021-10-19 MED ORDER — dexAMETHasone (DECADRON) 4 mg tablet
4 | ORAL_TABLET | ORAL | 0 refills | 6.00000 days | Status: DC
Start: 2021-10-19 — End: 2021-10-20

## 2021-10-19 MED ORDER — prochlorperazine (COMPAZINE) 10 mg tablet
10 | ORAL_TABLET | Freq: Four times a day (QID) | ORAL | 3 refills | 8.00000 days | Status: DC | PRN
Start: 2021-10-19 — End: 2022-03-31

## 2021-10-19 MED ORDER — ondansetron (ZOFRAN-ODT) 8 mg disintegrating tablet
8 | ORAL_TABLET | Freq: Three times a day (TID) | ORAL | 3 refills | Status: DC | PRN
Start: 2021-10-19 — End: 2022-03-31

## 2021-10-19 NOTE — Progress Notes (Signed)
[facility] Chemo 101 Note  [address]   [place], [address]  [id number]    Patient:   Brandy [name]  [id number]  10/19/2021     Diagnosis:  Malignant neoplasm of left breast in female    Treatment plan:  Docetaxel/Carboplatin/Trastuzumab/Pertuzumab    Participants:  Patient and husband    Learning and Barriers:  none    Instruction Methods:  Verbal and written education    Chemotherapy Side Effects Covered:  Patient and husband educated on Docetaxel/Carboplatin/Trastuzumab/Pertuzumab treatment plan per treatment plan manager. Patient/husband educated on labs day prior to treatment/oncology appointments. Instructed on infection protocol, bleeding protocol, anemia, and s/s to report. Instructed on when to contact CTC and when to utilize the ED. Reviewed management of diarrhea, constipation, and nausea/vomiting with associated OTC/prescription protocols. Patient/husband educated on hair loss/hair thinning and renewal suite service. Instructed on reporting peripheral neuropathy and hearing loss. Instructed on s/s of neurotoxicity to report. Instructed on hypersensitivity reaction, s/s to report, and CTC protocol. Instructed on mouth sores, preventative interventions, and s/s to report. Reviewed nephrotoxicity and importance of adequate hydration. Reviewed flu-like symptoms and home management. Reviewed ECHO, indication, and routine monitoring. Patient/husband educated on interventions to protect family, visitors, and pets. Written educated from [employer] provided. Patient/husband educated on adequate fluid intake, maintaining nutrition, activity as tolerated, and avoiding sun exposure. Patient/husband verbalized understanding.    Venous Access Assessment:  port      Patient Handout Materials:  Patient Education Material Source: Chemo Care / Up to Date      Prescriptions:    Patient verbalizes understanding of material covered.  Patient and husband verbalize understanding of education  provided. Patient/husband instructed to contact CTC with any questions/concerns. Patient/husband have no further questions at this time.    Notes:  Patient questions if she should continue her letrozole, will review with Dr. Nadeen Landau.   Patient requests a dexamethasone calendar.     Patient was unable to administer dexamethasone due to delayed release of medication. Reviewed with pharmacist and patient is to begin regimen tomorrow and then will receive dexamethasone in tx room. Patient verbalized understanding.      Randa Evens, RN    10/19/2021  6:34 PM

## 2021-10-19 NOTE — Telephone Encounter (Signed)
I called this patient to ask them about scheduling a Medicare Annual Wellness Visit. Patient's phone battery was getting low requests I text her the clinic phone number and name of Visit we we taling about so she can set up appointment. I left a message asking the to call the clinic to schedule or if they have questions. det

## 2021-10-20 ENCOUNTER — Ambulatory Visit: Admit: 2021-10-20 | Attending: RN

## 2021-10-20 ENCOUNTER — Encounter: Admit: 2021-10-20

## 2021-10-20 ENCOUNTER — Ambulatory Visit: Admit: 2021-10-20 | Discharge: 2021-10-20 | Attending: Medical Oncology

## 2021-10-20 DIAGNOSIS — Z5111 Encounter for antineoplastic chemotherapy: Secondary | ICD-10-CM

## 2021-10-20 DIAGNOSIS — C50919 Malignant neoplasm of unspecified site of unspecified female breast: Secondary | ICD-10-CM

## 2021-10-20 MED ORDER — dexAMETHasone (DECADRON) 4 mg tablet
4 | ORAL_TABLET | Freq: Every day | ORAL | 0 refills | 6.00000 days | Status: DC
Start: 2021-10-20 — End: 2022-01-19

## 2021-10-20 MED ORDER — pegfilgrastim (NEULASTA ONPRO) wearable injector 6 mg
6 | Freq: Once | SUBCUTANEOUS | Status: AC
Start: 2021-10-20 — End: 2021-10-20
  Administered 2021-10-20: 21:00:00 6 mg via SUBCUTANEOUS

## 2021-10-20 MED ORDER — sodium chloride 0.9 % (NS) bolus 500 mL
Freq: Once | INTRAVENOUS | Status: AC
Start: 2021-10-20 — End: 2021-10-20
  Administered 2021-10-20 (×2): via INTRAVENOUS

## 2021-10-20 MED ORDER — cycloPHOSphamide (CYTOXAN) 1,300 mg in sodium chloride 0.9 % (NS) 250 mL chemo infusion
1 | Freq: Once | INTRAVENOUS | Status: AC
Start: 2021-10-20 — End: 2021-10-20
  Administered 2021-10-20 (×2): via INTRAVENOUS

## 2021-10-20 MED ORDER — DOXOrubicin (ADRIAMYCIN) 2 mg/mL chemo syringe 132 mg
2 | Freq: Once | INTRAVENOUS | Status: AC
Start: 2021-10-20 — End: 2021-10-20
  Administered 2021-10-20 (×6): 2 mg via INTRAVENOUS

## 2021-10-20 MED ORDER — fosnetupitant-palonosetron (AKYNZEO) 235 mg-0.25 mg in sodium chloride 0.9 % (NS) 50 mL IVPB
235 | Freq: Once | INTRAVENOUS | Status: AC
Start: 2021-10-20 — End: 2021-10-20
  Administered 2021-10-20 (×2): via INTRAVENOUS

## 2021-10-20 MED ORDER — dexAMETHasone (DECADRON) injection 10 mg
10 | Freq: Once | INTRAMUSCULAR | Status: AC
Start: 2021-10-20 — End: 2021-10-20
  Administered 2021-10-20: 18:00:00 10 mg via INTRAVENOUS

## 2021-10-20 NOTE — Telephone Encounter (Signed)
Pt is scheduled on 6/14 MWV. Please review.

## 2021-10-20 NOTE — Progress Notes (Signed)
Patient:   Brandy [name]  [id number]  10/20/2021    Diagnosis:  Breast Cancer    Treatment plan:  Doxorubicin and Cyclophosphamide every 3 weeks for 4 cycles. Pt then to get Docetaxel, Pertuzumab, and Trastuzumab.  Plan changed from [facility] to [facility] due to Carboplatin shortage.    Visit Info:  Chemo teach    Participants:  Pt and her spouse [name]    Learning and Barriers:  none identified    Chemotherapy Side Effects Covered:  Nausea, vomiting, low blood counts, hair loss, mouth sores, urine may appear dark or red due to doxorubicin for first couple urination after treatment, deacrease heart pumping capacity and we check heart function with echos every 3 months. We discussed low RBC,WBC, and platelets, poor appetite, discoloration of skin, diarrhea, mouth sores, and bladder irritation.    We discussed that its important to urinate when needed and don't hold onto urine has cyclophosphamide can cause irritation in the bladder. Pt reported that she doesn't drink much water. Pt is encouraged to drink 2-3 liters of fluids a day. We discussed doing baking soda/ salter water rinses for mouth sores. Pt to call us if she needs magic mouth wash. We discussed imodium for diarrhea and miralax, stool softener, prune juice for constipation. Pt to take Zofran and Compazine as needed for nausea/vomiting.  We discussed bone pain from Neulasta. Pt encouraged to try Claritin/ loratidine for bone pain.   PT and spouse reminded to get labs drawn before each treatment.  Pt was given handouts on medications from [email or web address]    Infection Precautions:  Pegfilgrastim/Filgrastim:Yes  Claritin:Yes    Venous Access Assessment:  Port      Patient Handout Materials:  Patient Education Material Source: Education officer, [email or web address]    Patient verbalizes understanding of material covered.  Patient verbalizes understanding of material covered.Yes   Pt signed consent.    PT instructed to take Dexamethasone for 3 days after treatment.  All questions were answered. Pt  started chemotherapy today.

## 2021-10-20 NOTE — Progress Notes (Signed)
The patient stated that she has a dull ache in her left armpit, breast and abdomin. She said she does not have any other new symptoms.

## 2021-10-20 NOTE — Progress Notes (Signed)
[facility] Infusion Nursing Note:    Brandy [name] is a 66 y.o. female    Problem:  Infusion IV and Chemotherapy      Assessment/Actions:  There were no vitals filed for this visit.    Pre-Treatment Head to Toe Assessment  Orientation Level: Oriented X4  Learning Barriers: None  Emotional Status: Calm  LOC: Alert  Neuro Symptoms: Fatigue (ongoing, not new)  Difficulty Sleeping: No  Mobility: Ambulatory, Gait steady  Moves all Extremities: Well  Bilateral Breath Sounds: Other (Comment) (no concerns at this time.)  Cough: None  Is patient on O2?: No  O2 Device: None (Room air)  Generalized Edema: None  Cardiac Regularity and Symptoms:  (No concerns today.)  Skin Color: Appropriate for ethnicity  Skin Condition/Temp: Warm, Dry  GI/Abdomen:  (pt denies concerns)  Appetite: Good  Genitourinary Symptoms: Other (Comment) (no concerns at this time.)  Pain Score:   1  Pain Loc: Abdomen  Pain Edu?: Yes    Continuing/Changes:  Pt here for Day 1, Cycle 1 of treatment and is getting first infusion treatment of Doxorubicin, and Cyclophosphamide. Pt is also getting pegfligrastim, wearable injector applied today after IV treatment. Pt received a teach today on all drugs in regiment with RN Dedra Skeens before treatment was started.     Evaluation:  PAC with 20g 1in huber needle. Site of PAC is swollen still from the port being placed. Steri strips are still in place over the incision site. Needle was place below the steri strips. Flushed with 10cc NS and +BR observed.  Pt denies any symptoms or concerns today before starting treatment.    Pt tolerated the treatment well and Neulasta was placed. Pt advised on how Neulasta would work and verbalized understanding on when to take it off.   PAC was flushed with 20cc NS and +BR observed.     Pt reports an allergy to heparin and was therefore saline locked. No heparin was used.     Pt advised to call CTC with any questions or concerns and is aware of next treatment date. Pt left treatment floor  stable.     Sinda Du, RN

## 2021-10-20 NOTE — Progress Notes (Signed)
NAME:  Brandy [name]  MRN:  [id number]  DOB:  [birth year]    PROGRESS NOTE      REASON FOR FOLLOW-UP:     This is a 66 y.o. female who presents today for a follow-up regarding Malignant neoplasm of left breast in female, estrogen receptor positive, unspecified site of breast (HCC) [C50.912, Z17.0] .    HISTORY OF PRESENT ILLNESS:    HPI   Patient self-referred herself for a screening mammogram in February 2023: BI-RADS 0 incomplete:  This reported unusual mass in the left mid breast outer lower quadrant measuring 11 mm.  Associated with some calcifications and architectural distortion.  Adjacent possible satellite lesion measuring 7 mm.  Diagnostic ultrasound recommended with biopsy as needed.    07/29/2021: Ultrasound breast bilateral:  Left breast: Irregular 10 mm spiculated mass with irregular margins and posterior shadowing in the left breast at the 3 o'clock position.  Enlarged lymph nodes with thickened cortex in the left axilla measuring 4 cm.  BI-RADS 5-highly suggestive of malignancy.  Irregular mass in the left lateral breast with enlarged lymph node in the left axilla.    08/10/2021: Ultrasound-guided breast biopsy left:  No suspicious clusters of microcalcifications.  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  ER 99%, PR 95% HER2/neu 3+, Ki-67 70%, p53 70%    09/20/2021:  Left axillary lymph node needle core biopsy: Metastatic carcinoma similar to that in the left breast biopsy performed 4 weeks ago with similar positive ER 25% PR 2% and positive 3+ HER2 profile; Ki-67 40%, p53 35%.    09/20/2021 endometrial curettage:Benign inactive endometrium suggestive of benign endometrial polyp.    09/27/2021: Presents for discussion of neoadjuvant chemotherapy.      INTERVAL HISTORY:    10/20/2021:   Patient presents today for a scheduled infusion of  TCHP, and to discuss recent labs. She was last seen here 09/27/21.   She had a right subclavian port placement by Dr. Truitt Merle on 10/11/21.     She is doing well overall and looking forward to start of her neoadjuvant therapy.  She has been informed about shortage and nonavailability of carboplatin and hence the need for change of neoadjuvant regimen      09/27/2021:     Patient presents today for a follow up visit to discuss recent imaging results.   She was last seen here 08/19/2021. She is scheduled for a PET scan in 10/2021.     She has been doing well. She has been experiencing vaginal spotting, but no bleeding.   Patient was admitted for a dilatation and curettage, and hysteroscopy with myosure on 09/16/2021.     She describes lower back pain and pain between her shoulders. She does not sleep well.     She expresses concern today with her biopsy results and prognosis, as she describes a history of medical issues and complications, including heparin allergy, kidney issues, toe amputation, and finger amputation. No regular at home BP readings.     She takes ibuprofen for her back pain. She takes calcium and Vit D.       PAST MEDICAL HISTORY:    Past Medical History:   Diagnosis Date   . Arrhythmia    . CHF (congestive heart failure) (HCC)    . Colon cancer (HCC) 12/28/2016   . Colonic mass 11/28/2016  POSTOPERATIVE DIAGNOSIS 11/28/16 colonoscopy:  1) Colonoscopy with 3/4 circumference apple core lesion at full insertion of the colonoscope but likely distal to the Hepatic flexure 2) Hemorrhoids: mild internal 3) Mass: hot forceps biopsied times two without complications and tattooed times two proximal to the mass  Biopsy suspicious for intramucosal adenocarcinoma. Referral to Dr Oran Rein done and she   . Encounter for screening colonoscopy 12/05/2017   . History of blood transfusion    . HIT (heparin-induced thrombocytopenia) (HCC)    . Hyperlipidemia    . Malignant neoplasm of axillary tail of left breast in female,  estrogen receptor positive (HCC) 08/19/2021   . Screening for colon cancer 10/04/2016    POSTOPERATIVE DIAGNOSIS 11/28/16 colonoscopy:  1) Colonoscopy with 3/4 circumference apple core lesion at full insertion of the colonoscope but likely distal to the Hepatic flexure 2) Hemorrhoids: mild internal 3) Mass: hot forceps biopsied times two without complications and tattooed times two proximal to the mass  Biopsy suspicious for intramucosal adenocarcinoma. Referral to Dr Oran Rein done and she   . Thyroid disease     Hyperparathyroid       PAST SURGICAL HISTORY:    Past Surgical History:   Procedure Laterality Date   . CARDIAC SURGERY  01/23/2016    AVR   . COLON SURGERY  2018    Hemicolectomy   . COLONOSCOPY N/A 05/04/2021    Procedure: COLONOSCOPY; W/ POSSIBLE BIOPSY & POLYPECTOMY;  Surgeon: Truitt Merle, MD;  Location: [facility] OR;  Service: [facility] Procedures;  Laterality: N/A;   . CORONARY ANGIOPLASTY     . DILATION AND CURETTAGE OF UTERUS N/A 09/16/2021    Procedure: DILATATION AND CURETTAGE; HYSTEROSCOPY with myosure;  Surgeon: Conni Elliot, MD;  Location: [facility] OR;  Service: [facility] Procedures;  Laterality: N/A;   . PROCEDURE N/A 01/27/2016    Procedure: AORTIC VALVE REPLACEMENT; INTRA AORTIC BALLOON INSERTION;  Surgeon: Otho Bellows, MD;  Location: [facility] OR;  Service: Cardiology;  Laterality: N/A;   . PROCEDURE Right 03/10/2016    Procedure: AMPUTATION TOE;  Surgeon: Chrystie Nose, DPM;  Location: [facility] OR;  Service: Podiatry;  Laterality: Right;   . PROCEDURE N/A 11/28/2016    Procedure: COLONOSCOPY; BIOPSY;  Surgeon: Gailen Shelter, MD;  Location: [facility] OR;  Service: [facility] Procedures;  Laterality: N/A;  ink spot marking apple core mass proximal lesion distal to hepatic flexure   . PROCEDURE Right 12/28/2016    Procedure: LAPAROPSCOPIC CONVERTED TO OPEN HAND ASSISTED RIGHT HEMI COLECTOMY;  Surgeon: Truitt Merle, MD;  Location: [facility] OR;  Service: [facility] Procedures;  Laterality: Right;  Dr. Oran Rein wants 3 hours   . PROCEDURE N/A 01/15/2018     Procedure: COLONOSCOPY; W/ POSSIBLE BIOPSY & POLYPECTOMY;  Surgeon: Truitt Merle, MD;  Location: [facility] OR;  Service: [facility] Procedures;  Laterality: N/A;   . PROCEDURE N/A 03/19/2018    Procedure: PARATHYROIDECTOMY; RECURRENT LARYNGEAL NERVE MONITORING, INTRAOPERATIVE PARATHYROID HORMONE;  Surgeon: Edmund Hilda, MD;  Location: [facility] OR;  Service: Ear Nose and Throat;  Laterality: N/A;   . TONSILLECTOMY     . TUBAL LIGATION  1980   . US GUIDED LYMPH NODE BIOPSY  09/15/2021    US GUIDED LYMPH NODE BIOPSY 09/15/2021 Jeanice Lim, MD [facility] [place] [facility]       CURRENT MEDICATIONS AND ALLERGIES:    Current Outpatient Medications   Medication Instructions   . acetaminophen (TYLENOL) 500 mg, Oral, Every 6 hours PRN   . aspirin 81 mg, Every morning   . atorvastatin (  LIPITOR) 20 mg, Oral, Nightly   . CALCIUM ORAL 1 tablet, Oral, Every morning   . carvediloL (COREG) 6.25 mg, Oral, 2 times daily with meals   . cholecalciferol, vitamin D3, (VITAMIN D3 ORAL) 1 tablet, Oral, Every morning   . EPINEPHrine (EPIPEN) 0.3 mg, Intramuscular, Once   . ibuprofen (ADVIL) 200 mg, Oral, 2 times daily PRN   . letrozole [facility]) 2.5 mg, Oral, Daily       Allergies   Allergen Reactions   . Heparin Anaphylaxis     Possible HIT   . Sulfa (Sulfonamide Antibiotics) Rash     In ICU for 3 days post dose         PATIENT HISTORY:    Social History     Tobacco Use   . Smoking status: Never     Passive exposure: Yes   . Smokeless tobacco: Never   . Tobacco comments:     SBIRT 02/13/2018 [facility] Father smoked in the home.    Vaping Use   . Vaping Use: Never used   Substance Use Topics   . Alcohol use: No   . Drug use: No       Family History   Problem Relation Age of Onset   . Colon cancer Mother         dx at older age   . Heart disease Father    . Heart disease Sister    . Cancer Brother    . Heart disease Maternal Aunt    . Ovarian cancer Maternal Aunt    . Cancer Maternal Uncle    . Heart disease Maternal Uncle    . Breast cancer Paternal Aunt        REVIEW OF  SYSTEMS:      Review of Systems - Oncology  A 14 point review of systems was obtained and is essentially negative for any symptoms other than as outlined in interval history.    PHYSICAL EXAM:    Physical Exam     BP 155/88 (BP Location: Right arm)   Pulse 69   Temp 36.1 ?C (96.9 ?F)   Ht 5' 3 (1.6 m)   Wt 260 lb 8 oz (118.2 kg)   LMP  (LMP Unknown)   SpO2 92%   BMI 46.15 kg/m?   Body surface area is 2.29 meters squared.  Pain level: Pain Score: 0-No pain     Well developed , well nourished female in no acute distress, looks her stated age  HENT: Normocephalic, Atraumatic, PERLA, EOMI  Sclera anicteric, Conjunctiva pink  Oral mucosa: moist, no exudates, no tonsillar enlargement, no pharyngeal erythema  Neck :Supple, No JVD,no palpable lymphadenopathy, thyroid not palpable  Extremities: No edema, erythema, tenderness or deformities        LABS:    Lab Walk-In on 09/15/2021   Component Date Value   . CA 15-3 09/15/2021 26.0 (H)    . CEA 09/15/2021 11.1 (H)    . Sodium 09/15/2021 140    . Potassium 09/15/2021 3.79    . Chloride 09/15/2021 103.2    . CO2 - Carbon Dioxide 09/15/2021 25    . BUN 09/15/2021 12    . Creatinine 09/15/2021 0.73    . Glucose 09/15/2021 92    . Calcium 09/15/2021 9.3    . AST - Aspartate Aminotra* 09/15/2021 16    . ALT - Alanine Aminotrans* 09/15/2021 11    . Alkaline Phosphatase 09/15/2021 93    . Protein Total 09/15/2021  8.2    . Albumin 09/15/2021 4.1    . Bilirubin Total 09/15/2021 0.50    . Anion Gap 09/15/2021 11.8    . Albumin/Globulin Ratio 09/15/2021 1.0 (L)    . Glomerular Filtration Ra* 09/15/2021 >90    . GFR Additional Info 09/15/2021     . BUN / Creatinine Ratio 09/15/2021 16.4    . Osmolality Calculation 09/15/2021 278.8    . WBC 09/15/2021 5.0    . RBC 09/15/2021 4.25    . Hemoglobin 09/15/2021 12.4    . HCT 09/15/2021 38.0    . MCV 09/15/2021 89.4    . [facility] 09/15/2021 29.3    . MCHC 09/15/2021 32.8    . RDW 09/15/2021 14.8    . Platelet Count 09/15/2021 212    . MPV  09/15/2021 7.1    . Neutrophils % 09/15/2021 51    . Lymphocytes % 09/15/2021 39    . Monocytes % 09/15/2021 7    . Eosinophils % 09/15/2021 1    . Basophils % 09/15/2021 1    . Neutrophils, Absolute 09/15/2021 2.5    . Lymphocytes, Absolute 09/15/2021 1.9    . Monocytes, Absolute 09/15/2021 0.4    . Eosinophils, Absolute 09/15/2021 0.1    . Basophils, Absolute 09/15/2021 0.1    Admission on 09/15/2021, Discharged on 09/15/2021   Component Date Value   . Protime 09/15/2021 13.2    . INR 09/15/2021 1.1    . APTT 09/15/2021 32    . Platelet Count 09/15/2021 203    . Pathology Report 09/15/2021            IMAGING:     X-ray chest PA or AP 10/11/21:   FINDINGS:  LINES/TUBES: There is a right subclavian Port-A-Cath device with the tip of the catheter overlying the expected location of the cavoatrial junction.  HEART SIZE: There is slightly greater mild cardiomegaly.  There is evidence of previous coronary artery bypass grafting.  MEDIASTINUM: Stable median sternotomy changes along with a prosthetic heart valve.  LUNGS: Slightly low lung volumes.  Mild patchy and linear reticular opacities are seen within both mid and both lower lung zones which may reflect atelectasis/scarring although clinical correlation is recommended to exclude a developing pneumonia.  PLEURA: There is slight blunting of both lateral costophrenic angles which may reflect pleural thickening versus small bilateral pleural effusions.  OTHER: None.    NM PET CT skull to thigh 10/12/21:  IMPRESSION:  1. No significant abnormal foci of radiotracer uptake over the breasts.  2. Minimal asymmetric focal uptake in 2 lymph nodes in the left axilla, not greater than background activity.  This is a nonspecific finding which can be associated with prior surgery in the left breast, evidence of infection, and early metastatic   disease.  Consider follow-up PET scan in 3-6 months.  3. Coarse interstitial opacities and ground-glass airspace disease in the lung bases  bilaterally.  Differential diagnosis includes, but is not limited to, viral infection, atypical pneumonia, chronic interstitial lung disease, versus secondary changes of   atelectasis.  4. 9.4 cm fat containing mass of the musculature of the left medial thigh posteriorly, suggesting lipoma.  This is not associated with abnormal hypermetabolism on PET scan.        Echo 2D complete w doppler wo contrast 09/01/2021:   IMPRESSION  1. Global systolic function: Left ventricular EF is 60 %.  2. 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 It is well seated and does  not rock. Transvalvular gradients are within expected limits for the valve. Mean  gradient 7.96 mm hg. There is no aortic regurgitation.  3. The mitral valve has mild regurgitation.  4. Compared to the prior study in 2021, EF is now 60% from 69%      ASSESSMENT AND PLAN:  Encounter Diagnoses   Name Primary?   . Malignant neoplasm of left breast in female, estrogen receptor positive, unspecified site of breast (HCC) Yes   . HER2-positive carcinoma of breast (HCC)    . Menopausal syndrome on hormone replacement therapy    . Long term (current) use of aromatase inhibitors    . Non-ischemic cardiomyopathy (HCC)      66 year old female with newly diagnosed locally advanced left breast ductal carcinoma, grade 3, with axillary involvement by biopsy, ER positive PR positive, HER2/neu 3+ by immunohistochemistry, clinical stage T1N 1-2 MX.  Per NCCN guidelines plan is to proceed with neoadjuvant trastuzumab and pertuzumab in combination with Taxotere and carboplatin.    Patient has been on neoadjuvant endocrine therapy with aromatase inhibitors since diagnosis 4 weeks ago.  A baseline bone density study is pending. Staging PET/CT scan is pending.    Discussed in detail plans for neoadjuvant chemotherapy with combination based on anti-HER2 agents with Taxotere and carboplatin (most recent echocardiogram shows LVEF of 60%) for a total of 6 cycles on a every 3  week regimen with G-CSF support.  This will be followed by definitive breast surgery, and continued adjuvant trastuzumab and pertuzumab therapy for a total of 52 weeks.  Patient would also likely be a candidate for adjuvant radiation therapy given significant axillary involvement consistent with metastatic disease.  PET-CT scan 10/12/2021 no significant abnormal foci of radiotracer uptake over the breasts and minimal asymmetric focal uptake in 2 lymph nodes in the left axilla, not greater than background activity.    In light of non availability of carboplatin, will change neoadjuvant regimen to adriamycin + cytoxan followed by taxol w herceptin + perjeta.   Will need monitoring of cardiac function ( has h/o non ischemic cardiomyopathy ).      Patient Instructions     Instructions and Staff Orders :   Neoadjuvant ( presurgery ) Treatment plan changed to Adriamycin and Cytoxan X 4 every 3 weeks followed by Docetaxel + Herceptin + Perjeta X 4-6 Q 3 weeks     Lab studies : as per infusion schedule      Imaging : NO    Referral: No    Medications/Rx :NO    Follow up : 3 weeks     Take ondansetron 8 mg PO three times a day starting Day 2 ( day after infusion ) round the clock X 3 days, then every 8 hours as needed for nausea  Add Compazine 10 mg PO alternating with ondansetron every 8 hours as needed if ondansetron alone is not effective     Take Dexamethasone 2 tabs ( 8mg  ) every AM with food for 3 days starting Day 2 ( day after infusion )     I have spent a total of 30  minutes on this patient's care today.  This time includes face-to-face time with the patient as well as time spent reviewing patient records, coordinating/communicating with care teams and documenting the patient's visit.      Lindley Magnus, MD    Scribe Attestation   Sign Off: Scribe: By adding my name here, I, Leontine Locket, Scribe, attest that this documentation has been prepared  under the direction and in the presence of Dr. Nadeen Landau.    Attending: I, Dr.  Nadeen Landau, personally performed the services described in this documentation. All medical record entries made by the scribe were at my direction and in my presence. I have reviewed the chart and discharge instructions (if applicable) and agree that the record reflects my personal performance and is accurate and complete. Dr. Nadeen Landau.

## 2021-10-20 NOTE — Patient Instructions (Addendum)
Instructions and Staff Orders :   Neoadjuvant ( presurgery ) Treatment plan changed to Adriamycin and Cytoxan X 4 every 3 weeks followed by Docetaxel + Herceptin + Perjeta X 4-6 Q 3 weeks     Lab studies : as per infusion schedule      Imaging : NO    Referral: No    Medications/Rx :NO    Follow up : 3 weeks       Take ondansetron 8 mg PO three times a day starting Day 2 ( day after infusion ) round the clock X 3 days, then every 8 hours as needed for nausea    Add Compazine 10 mg PO alternating with ondansetron every 8 hours as needed if ondansetron alone is not effective     Take Dexamethasone 2 tabs ( 8mg  ) every AM with food for 3 days starting Day 2 ( day after infusion )

## 2021-10-21 NOTE — Progress Notes (Signed)
Dr. Nadeen Landau instructs that Letrozole is discontinued. Patient notified and verbalized understanding, no questions/concerns at this time. Patient reports doing very well, instructed to contact [facility] if any needs arise, and patient verbalized understanding.

## 2021-10-21 NOTE — Telephone Encounter (Signed)
Mailed HRA form to patient on 10/21/21.

## 2021-10-25 ENCOUNTER — Inpatient Hospital Stay: Admit: 2021-10-25 | Discharge: 2021-11-22 | Attending: Medical Oncology

## 2021-10-25 DIAGNOSIS — Z170 Estrogen receptor positive status [ER+]: Secondary | ICD-10-CM

## 2021-10-25 DIAGNOSIS — C50912 Malignant neoplasm of unspecified site of left female breast: Secondary | ICD-10-CM

## 2021-11-09 ENCOUNTER — Other Ambulatory Visit: Admit: 2021-11-09

## 2021-11-09 DIAGNOSIS — R590 Localized enlarged lymph nodes: Secondary | ICD-10-CM

## 2021-11-09 LAB — CELLAVISION DIFFERENTIAL
Bands %: 1 % (ref 0–2)
Bands, Absolute: 0 10*3/ÂµL
Basophils %: 1 % (ref 0–2)
Basophils, Absolute: 0 10*3/ÂµL (ref 0.0–0.1)
Eosinophils %: 0 % (ref 0–5)
Eosinophils, Absolute: 0 10*3/ÂµL (ref 0.0–0.4)
Lymphocytes %: 30 % (ref 20–40)
Lymphocytes, Absolute: 1.2 10*3/ÂµL (ref 1.0–4.0)
Monocytes %: 11 % (ref 2–12)
Monocytes, Absolute: 0.4 10*3/ÂµL (ref 0.2–1.0)
Neutrophils %: 58 % (ref 50–74)
Neutrophils, Absolute: 2.3 10*3/ÂµL (ref 2.0–7.4)
Platelet Estimate: ADEQUATE
Total Cells Counted: 100
WBC: 4 10*3/ÂµL — ABNORMAL LOW (ref 4.5–11.0)

## 2021-11-09 LAB — CBC WITH AUTO DIFFERENTIAL
HCT: 34.9 % — ABNORMAL LOW (ref 35.0–48.0)
Hemoglobin: 11.3 g/dL — ABNORMAL LOW (ref 11.7–16.5)
MCH: 29.4 pg (ref 28.3–33.3)
MCHC: 32.4 g/dL — ABNORMAL LOW (ref 32.5–36.0)
MCV: 90.8 fL (ref 81.0–100.0)
MPV: 7.7 fL (ref 6.9–10.0)
Platelet Count: 327 10*3/ÂµL (ref 150–405)
RBC: 3.84 10*6/ÂµL (ref 3.80–5.60)
RDW: 16.2 % — ABNORMAL HIGH (ref 11.7–16.1)
WBC: 4 10*3/ÂµL — ABNORMAL LOW (ref 4.5–11.0)

## 2021-11-09 LAB — COMPREHENSIVE METABOLIC PANEL
ALT - Alanine Aminotransferase: 29 IU/L (ref 5–33)
AST - Aspartate Aminotransferase: 21 IU/L (ref 5–32)
Albumin/Globulin Ratio: 1.1 — ABNORMAL LOW (ref 1.2–2.2)
Albumin: 3.6 g/dL (ref 3.5–5.0)
Alkaline Phosphatase: 80 IU/L (ref 35–105)
Anion Gap: 11 mmol/L (ref 9.0–18.0)
BUN / Creatinine Ratio: 36 — ABNORMAL HIGH (ref 12.0–20.0)
BUN: 27 mg/dL — ABNORMAL HIGH (ref 8–20)
Bilirubin Total: 0.6 mg/dL (ref 0.10–1.70)
CO2 - Carbon Dioxide: 25 mmol/L (ref 17–27)
Calcium: 9.8 mg/dL (ref 8.6–10.6)
Chloride: 104 mmol/L (ref 101.0–111.0)
Creatinine: 0.75 mg/dL (ref 0.60–1.30)
Glomerular Filtration Rate Estimate (Female): 88 mL/min/{1.73_m2} (ref >=60)
Glucose: 98 mg/dL (ref 74–106)
Osmolality Calculation: 284.5 mOsm/kg (ref 275.0–300.0)
Potassium: 3.95 mmol/L (ref 3.50–5.10)
Protein Total: 6.9 g/dL (ref 6.4–8.2)
Sodium: 140 mmol/L (ref 135–145)

## 2021-11-10 ENCOUNTER — Ambulatory Visit: Admit: 2021-11-10

## 2021-11-10 ENCOUNTER — Ambulatory Visit: Admit: 2021-11-10 | Discharge: 2021-11-10 | Attending: FNP

## 2021-11-10 DIAGNOSIS — R590 Localized enlarged lymph nodes: Secondary | ICD-10-CM

## 2021-11-10 DIAGNOSIS — C50612 Malignant neoplasm of axillary tail of left female breast: Secondary | ICD-10-CM

## 2021-11-10 DIAGNOSIS — Z5111 Encounter for antineoplastic chemotherapy: Secondary | ICD-10-CM

## 2021-11-10 MED ORDER — cycloPHOSphamide (CYTOXAN) 1,300 mg in sodium chloride 0.9 % (NS) 250 mL chemo infusion
1 | Freq: Once | INTRAVENOUS | Status: AC
Start: 2021-11-10 — End: 2021-11-10
  Administered 2021-11-10 (×2): via INTRAVENOUS

## 2021-11-10 MED ORDER — dexAMETHasone (DECADRON) injection 10 mg
10 | Freq: Once | INTRAMUSCULAR | Status: AC
Start: 2021-11-10 — End: 2021-11-10
  Administered 2021-11-10: 17:00:00 10 mg via INTRAVENOUS

## 2021-11-10 MED ORDER — DOXOrubicin (ADRIAMYCIN) 2 mg/mL chemo syringe 132 mg
2 | Freq: Once | INTRAVENOUS | Status: AC
Start: 2021-11-10 — End: 2021-11-10
  Administered 2021-11-10 (×6): 2 mg via INTRAVENOUS

## 2021-11-10 MED ORDER — fosnetupitant-palonosetron (AKYNZEO) 235 mg-0.25 mg in sodium chloride 0.9 % (NS) 50 mL IVPB
235 | Freq: Once | INTRAVENOUS | Status: AC
Start: 2021-11-10 — End: 2021-11-10
  Administered 2021-11-10 (×2): via INTRAVENOUS

## 2021-11-10 MED ORDER — pegfilgrastim (NEULASTA ONPRO) wearable injector 6 mg
6 | Freq: Once | SUBCUTANEOUS | Status: AC
Start: 2021-11-10 — End: 2021-11-10
  Administered 2021-11-10: 20:00:00 6 mg via SUBCUTANEOUS

## 2021-11-10 MED ORDER — sodium chloride 0.9 % (NS) bolus 500 mL
Freq: Once | INTRAVENOUS | Status: AC
Start: 2021-11-10 — End: 2021-11-10
  Administered 2021-11-10 (×2): via INTRAVENOUS

## 2021-11-10 NOTE — Progress Notes (Signed)
[facility] Infusion Nursing Note:    Brandy [name] is a 66 y.o. female    Problem:  Infusion IV (Doxorubicin/Cyclophosphamide )      Assessment/Actions:  There were no vitals filed for this visit.    Pre-Treatment Head to Toe Assessment  Orientation Level: Oriented X4  Learning Barriers: None  Emotional Status: Calm  LOC: Alert  Neuro Symptoms: None  Difficulty Sleeping: No  Mobility: Ambulatory, Walker, Gait steady  Moves all Extremities: Well (Pt uses walker)  Bilateral Breath Sounds:  (denies concerns)  Cough: None  Is patient on O2?: No  O2 Device: None (Room air)  Generalized Edema: None  Cardiac Regularity and Symptoms:  (Pt denies concerns)  Telemetry/Cardiac Monitor: No  Skin Color: Appropriate for ethnicity  Skin Condition/Temp: Warm, Dry  GI/Abdomen:  (Pt reports that after last treatment for 1.5 weeks she had some N/D but it cleared up.)  Appetite: Other (Comment)  Genitourinary Symptoms: Other (Comment) (none)  Pain Score: 0-No pain  Pain Edu?: Yes    Continuing/Changes:  ZO:XWRUE cancer, Malignant neoplasm of left breast.  Pt reported that for the first week and a few days after last treatment she had some N/D as well as a decrease in appetite but this had all resolved since then, and she had been taking her home medications as prescribed. Pt reminded to start her Dexamethasone tomorrow morning. Pt saw FNP Jaci before treatment   Pt denies n/v/d, fever, chills, SOB, coughing, rash, difficulty eating and drinking.   Pt here for Doxorubicin and Cyclophosphamide chemotherapy on C2 D1.    Pt pleasant and cooperative with care, able to make needs known, A&Ox4. Pt arrived with the ability to get around Independent.    Treatment plan reviewed and double checked. Okay to treat.       Evaluation:  Pt rt chest port accessed with sterile technique, flushed with NS, brisk blood return visualized.   Pt completed treatment without issues. Pt chest port flushed with NS, NS locked and de-accessed. Pt requests no heparin.  Onpro applied at 12:40 and pt given information packet for when medication will be injected and when to remove the Onpro device. Pt verbalized understanding. Pt advised to call [facility] with any questions or concerns. Pt discharged in stable condition.     Sinda Du, RN

## 2021-11-10 NOTE — Progress Notes (Signed)
Pt is weaker and sleeps a lot. She fell going to the bathroom one night, her legs gave out. She is OK and uses a walker now to get around the house. She has lost some weight but says her appetite is good and she is eating a lot. She is losing her hair.

## 2021-11-10 NOTE — Progress Notes (Signed)
NAME:  Brandy [name]  MRN:  [id number]  DOB:  02/23/1956  PROGRESS NOTE      REASON FOR VISIT:    Chief Complaint   Patient presents with   . Follow-up     3wk f/u breast cancer      HISTORY OF PRESENT ILLNESS  This patient is a 66 y.o. female who presents for follow-up regarding left breast cancer, as well as for cycle #2 A/C. The patient's care has been transferred to me. Pertinent hematology/oncology history has been obtained in part via review of medical records, confirmed with patient at time of visit.     HEMATOLOGY-ONCOLOGY HISTORY  Per Dr. Jacquelin Hawking progress note dated 10/20/2021:     Patient self-referred herself for a screening mammogram in February 2023: BI-RADS 0 incomplete:This reported unusual mass in the left mid breast outer lower quadrant measuring 11 mm.  Associated with some calcifications and architectural distortion.  Adjacent possible satellite lesion measuring 7 mm. Diagnostic ultrasound recommended with biopsy as needed.  ?  07/29/2021: Ultrasound breast bilateral - BI-RADS 5-highly suggestive of malignancy. Left breast: Irregular 10 mm spiculated mass with irregular margins and posterior shadowing in the left breast at the 3 o'clock position. Enlarged lymph nodes with thickened cortex in the left axilla measuring 4 cm. rregular mass in the left lateral breast with enlarged lymph node in the left axilla.  ?  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  ER 99%, PR 95% HER2/neu 3+, Ki-67 70%, p53 70%  ?  09/20/2021: Left axillary lymph node needle core biopsy: Metastatic carcinoma similar to that in the left breast biopsy performed 4 weeks ago with similar positive ER 25% PR 2% and positive 3+ HER2 profile; Ki-67 40%, p53 35%.  ?  09/20/2021: Endometrial curettage:Benign inactive endometrium suggestive of  benign endometrial polyp.    10/20/2021: Cycle #1 AC + neulasta onpro    10/21/2021: Letrozole discontinued    11/10/2021: Cycle #2 AC + neulasta onpro    11/10/2021 INTERVAL HISTORY: patient presents for follow-up; accompanied by her spouse throughout this visit. Overall the patient reports that she is feeling good. She states that she felt pretty fatigued and weak for approximately 1 week following chemotherapy, but has gained strength and energy over the past couple of weeks. She denies nausea or vomiting. Appetite and weight are stable. Denies diarrhea or constipation. She is very motivated to continue through chemotherapy and ultimately undergo surgery. She inquires about Dandelion Root tea as a supplement, as it was suggested to her from a family member. Although some literature suggests potential health benefits, herbs and supplements with antioxidant activity may interfere with the efficacy of doxorubicin and cyclophosphamide.At this time, I do not recommend that the patient take Dandelion Root supplements while she is undergoing cytotoxic chemotherapy.      PAST MEDICAL HISTORY:  Past Medical History:   Diagnosis Date   . Arrhythmia    . CHF (congestive heart failure) (HCC)    . Colon cancer (HCC) 12/28/2016   . Colonic mass 11/28/2016    POSTOPERATIVE DIAGNOSIS 11/28/16 colonoscopy:  1) Colonoscopy with 3/4 circumference apple core lesion at full insertion of the colonoscope but likely distal to the Hepatic flexure 2) Hemorrhoids: mild internal 3) Mass: hot forceps biopsied times two without complications and tattooed times two proximal to the mass  Biopsy suspicious for intramucosal  adenocarcinoma. Referral to Dr Oran Rein done and she   . Encounter for screening colonoscopy 12/05/2017   . History of blood transfusion    . HIT (heparin-induced thrombocytopenia) (HCC)    . Hyperlipidemia    . 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 Screening for  colon cancer 10/04/2016    POSTOPERATIVE DIAGNOSIS 11/28/16 colonoscopy:  1) Colonoscopy with 3/4 circumference apple core lesion at full insertion of the colonoscope but likely distal to the Hepatic flexure 2) Hemorrhoids: mild internal 3) Mass: hot forceps biopsied times two without complications and tattooed times two proximal to the mass  Biopsy suspicious for intramucosal adenocarcinoma. Referral to Dr Oran Rein done and she   . Thyroid disease     Hyperparathyroid     PAST SURGICAL HISTORY:  Past Surgical History:   Procedure Laterality Date   . CARDIAC SURGERY  01/23/2016    AVR   . COLON SURGERY  2018    Hemicolectomy   . COLONOSCOPY N/A 05/04/2021    Procedure: COLONOSCOPY; W/ POSSIBLE BIOPSY & POLYPECTOMY;  Surgeon: Truitt Merle, MD;  Location: [facility] OR;  Service: [facility] Procedures;  Laterality: N/A;   . CORONARY ANGIOPLASTY     . DILATION AND CURETTAGE OF UTERUS N/A 09/16/2021    Procedure: DILATATION AND CURETTAGE; HYSTEROSCOPY with myosure;  Surgeon: Conni Elliot, MD;  Location: [facility] OR;  Service: [facility] Procedures;  Laterality: N/A;   . PROCEDURE N/A 01/27/2016    Procedure: AORTIC VALVE REPLACEMENT; INTRA AORTIC BALLOON INSERTION;  Surgeon: Otho Bellows, MD;  Location: [facility] OR;  Service: Cardiology;  Laterality: N/A;   . PROCEDURE Right 03/10/2016    Procedure: AMPUTATION TOE;  Surgeon: Chrystie Nose, DPM;  Location: [facility] OR;  Service: Podiatry;  Laterality: Right;   . PROCEDURE N/A 11/28/2016    Procedure: COLONOSCOPY; BIOPSY;  Surgeon: Gailen Shelter, MD;  Location: [facility] OR;  Service: [facility] Procedures;  Laterality: N/A;  ink spot marking apple core mass proximal lesion distal to hepatic flexure   . PROCEDURE Right 12/28/2016    Procedure: LAPAROPSCOPIC CONVERTED TO OPEN HAND ASSISTED RIGHT HEMI COLECTOMY;  Surgeon: Truitt Merle, MD;  Location: [facility] OR;  Service: [facility] Procedures;  Laterality: Right;  Dr. Oran Rein wants 3 hours   . PROCEDURE N/A 01/15/2018    Procedure: COLONOSCOPY; W/ POSSIBLE BIOPSY & POLYPECTOMY;   Surgeon: Truitt Merle, MD;  Location: [facility] OR;  Service: [facility] Procedures;  Laterality: N/A;   . PROCEDURE N/A 03/19/2018    Procedure: PARATHYROIDECTOMY; RECURRENT LARYNGEAL NERVE MONITORING, INTRAOPERATIVE PARATHYROID HORMONE;  Surgeon: Edmund Hilda, MD;  Location: [facility] OR;  Service: Ear Nose and Throat;  Laterality: N/A;   . PROCEDURE Left 10/11/2021    Procedure: RIGHT SUBCLAVIAN PORT PLACEMENT;  Surgeon: Truitt Merle, MD;  Location: [facility] OR;  Service: [facility] Procedures;  Laterality: Left;   . TONSILLECTOMY     . TUBAL LIGATION  1980   . US GUIDED LYMPH NODE BIOPSY  09/15/2021    US GUIDED LYMPH NODE BIOPSY 09/15/2021 Jeanice Lim, MD [facility] [place] [facility]     CURRENT MEDICATIONS AND ALLERGIES:    Current Outpatient Medications:   .  aspirin 81 MG EC tablet, Take 81 mg by mouth every morning., Disp: , Rfl:   .  acetaminophen (TYLENOL) 500 MG tablet, Take 500 mg by mouth every 6 hours as needed for Pain. (Patient not taking: Reported on 10/20/2021), Disp: , Rfl:   .  atorvastatin (LIPITOR) 20  mg tablet, Take 1 tablet by mouth every night at bedtime. (Patient taking differently: Take 20 mg by mouth every night at bedtime.), Disp: 90 tablet, Rfl: 3  .  calcium carbonate/vitamin D3 (CALCIUM 600 + D,3, ORAL), Take 1 tablet by mouth every morning., Disp: , Rfl:   .  carvediloL (COREG) 6.25 mg tablet, Take 1 tablet by mouth 2 times daily with meals. (Patient taking differently: Take 6.25 mg by mouth 2 times daily with meals.), Disp: 180 tablet, Rfl: 1  .  cholecalciferol, vitamin D3, (VITAMIN D3) 50 mcg (2,000 unit) Tab tablet, Take 2,000 units by mouth every morning., Disp: , Rfl:   .  dexAMETHasone (DECADRON) 4 mg tablet, Take 2 tablets by mouth daily with breakfast. Take 8mg  by mouth once daily for three days starting day 2 of each cycle., Disp: 24 tablet, Rfl: 0  .  EPINEPHrine (EPIPEN) 0.3 mg/0.3 mL AtIn injection, Inject 0.3 mL into the muscle once for 1 dose., Disp: 0.3 mL, Rfl: 0  .  letrozole (FEMARA) 2.5 mg tablet, Take 1 tablet  by mouth once daily. (Patient not taking: Reported on 11/10/2021), Disp: 30 tablet, Rfl: 3  .  ondansetron (ZOFRAN-ODT) 8 mg disintegrating tablet, Take 1 tablet by mouth every 8 hours as needed for Nausea., Disp: 30 tablet, Rfl: 3  .  prochlorperazine (COMPAZINE) 10 mg tablet, Take 1 tablet by mouth every 6 hours as needed. For break through nausea/vomiting., Disp: 30 tablet, Rfl: 3  No current facility-administered medications for this visit.  Allergies   Allergen Reactions   . Bee Sting [Allergen Ext-Venom-Honey Bee] Anaphylaxis     Patient reports an epi-pen    . Heparin Anaphylaxis     Possible HIT   . Sulfa (Sulfonamide Antibiotics) Rash     In ICU for 3 days post dose       SOCIAL AND FAMILY HISTORY:  Social History     Tobacco Use   . Smoking status: Never     Passive exposure: Yes   . Smokeless tobacco: Never   . Tobacco comments:     SBIRT 02/13/2018 [facility] Father smoked in the home.    Vaping Use   . Vaping status: Never Used   Substance Use Topics   . Alcohol use: No   . Drug use: No     Family History   Problem Relation Age of Onset   . Colon cancer Mother         dx at older age   . Heart disease Father    . Heart disease Sister    . Cancer Brother    . Heart disease Maternal Aunt    . Ovarian cancer Maternal Aunt    . Cancer Maternal Uncle    . Heart disease Maternal Uncle    . Breast cancer Paternal Aunt      REVIEW OF SYSTEMS:   Review of Systems   Constitutional: Positive for appetite change and fatigue. Negative for fever and unexpected weight change.        Fatigue following chemotherapy; improved over the past 1-2 weeks. Diminished appetite, eating smaller meals    HENT:  Negative.    Respiratory: Negative.    Cardiovascular: Negative.    Gastrointestinal: Negative.  Negative for abdominal pain, blood in stool, constipation and diarrhea.   Genitourinary: Negative.     Skin: Negative.    Hematological: Negative.    Psychiatric/Behavioral: Negative.    All other systems reviewed and are negative.  PHYSICAL EXAM:  Vitals:    11/10/21 0850   BP: 134/87   BP Location: Right arm   Pulse: 50   Temp: (!) 35.9 ?C (96.6 ?F)   SpO2: 97%   Weight: 249 lb (112.9 kg)   Height: 5' 3 (1.6 m)      Physical Exam  Vitals and nursing note reviewed.   Constitutional:       General: She is not in acute distress.     Appearance: Normal appearance.   HENT:      Head: Normocephalic and atraumatic.      Mouth/Throat:      Pharynx: Oropharynx is clear.   Eyes:      General: No scleral icterus.     Conjunctiva/sclera: Conjunctivae normal.   Cardiovascular:      Rate and Rhythm: Normal rate and regular rhythm.      Pulses: Normal pulses.      Heart sounds: Normal heart sounds.   Pulmonary:      Effort: Pulmonary effort is normal. No respiratory distress.      Breath sounds: Normal breath sounds.   Abdominal:      General: There is no distension.   Musculoskeletal:         General: Normal range of motion.      Cervical back: Normal range of motion.   Skin:     General: Skin is warm and dry.   Neurological:      General: No focal deficit present.      Mental Status: She is alert and oriented to person, place, and time.   Psychiatric:         Mood and Affect: Mood normal.         Behavior: Behavior normal.     LABS:  Lab Walk-In on 11/09/2021   Component Date Value   . Sodium 11/09/2021 140    . Potassium 11/09/2021 3.95    . Chloride 11/09/2021 104.0    . CO2 - Carbon Dioxide 11/09/2021 25    . BUN 11/09/2021 27 (H)    . Creatinine 11/09/2021 0.75    . Glucose 11/09/2021 98    . Calcium 11/09/2021 9.8    . AST - Aspartate Aminotra* 11/09/2021 21    . ALT - Alanine Aminotrans* 11/09/2021 29    . Alkaline Phosphatase 11/09/2021 80    . Protein Total 11/09/2021 6.9    . Albumin 11/09/2021 3.6    . Bilirubin Total 11/09/2021 0.60    . Anion Gap 11/09/2021 11.0    . Albumin/Globulin Ratio 11/09/2021 1.1 (L)    . Glomerular Filtration Ra* 11/09/2021 88    . GFR Additional Info 11/09/2021     . BUN / Creatinine Ratio 11/09/2021 36.0 (H)    .  Osmolality Calculation 11/09/2021 284.5    . WBC 11/09/2021 4.0 (L)    . RBC 11/09/2021 3.84    . Hemoglobin 11/09/2021 11.3 (L)    . HCT 11/09/2021 34.9 (L)    . MCV 11/09/2021 90.8    . [facility] 11/09/2021 29.4    . MCHC 11/09/2021 32.4 (L)    . RDW 11/09/2021 16.2 (H)    . Platelet Count 11/09/2021 327    . MPV 11/09/2021 7.7    . WBC 11/09/2021 4.0 (L)    . Neutrophils % 11/09/2021 58    . Lymphocytes % 11/09/2021 30    . Monocytes % 11/09/2021 11    . Eosinophils % 11/09/2021 0    .  Basophils % 11/09/2021 1    . Bands % 11/09/2021 1    . Neutrophils, Absolute 11/09/2021 2.3    . Lymphocytes, Absolute 11/09/2021 1.2    . Monocytes, Absolute 11/09/2021 0.4    . Eosinophils, Absolute 11/09/2021 0.0    . Basophils, Absolute 11/09/2021 0.0    . Bands, Absolute 11/09/2021 0.0    . Total Cells Counted 11/09/2021 100    . Platelet Estimate 11/09/2021 Adequate    . Anisocytosis 11/09/2021 1+ (A)       ASSESSMENT / MEDICAL DECISION MAKING  Brandy [name] is a 66 y.o. female who is seen today for follow-up. All current laboratory and/or imaging results reviewed with the patient. 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 NCCN guidelines recommend treatment with  neoadjuvant trastuzumab and pertuzumab in combination with Taxotere and carboplatin. However, in light of current drug shortage of Carboplatin, the patient's treatment plan was switched to Adriamycin +Cyclophosphamide followed by taxol with herceptin and perjeta. Patient is doing well after receiving first cycle of AC. Tolerated cycle #1 without complications. Felt some fatigue and diminished appetite x1 week following  chemotherapy, which has since improved and returned to baseline.     ECOG SCORE: 1    PLAN:     1. Treatment: Proceed with cycle #2 A/C (Doxorubicin + Cyclophosphamide) today as scheduled   2. Labs: per treatment plan  3. Additional Recommendations:   o Daily calcium + vitamin D supplement  o Maintain a well-balanced diet  o Engage in daily physical activity as tolerated     4. Follow-up: return for visit with MD or NP in 3 weeks, or sooner if warranted.     Please call the [facility] if any further questions or concerns arise    I spent 30 minutes with the patient, including physical examination, face-to-face discussion regarding the above mentioned findings and recommendations, addressing the patient/family members questions and concerns, and documenting this visit. The patient verbalizes agreement and understanding of the plan of care at this time, and is encouraged to call the [facility] office if future questions or concerns arise.     West Pugh, FNP      [facility]  [address]  [place], [address]  [phone]    This dictation was produced using voice recognition software. Although efforts have been made to minimize transcription errors, homonyms and other transcription errors may be present and may not truly reflect my intent.

## 2021-11-23 ENCOUNTER — Ambulatory Visit: Admit: 2021-11-23 | Discharge: 2021-11-23 | Attending: FNP

## 2021-11-23 DIAGNOSIS — Z0000 Encounter for general adult medical examination without abnormal findings: Secondary | ICD-10-CM

## 2021-11-23 NOTE — Assessment & Plan Note (Signed)
Stable ECHO in 08/2021.  Asymptomatic.  Declines referral back to [facility] at this time due to focus on BCA treatment.

## 2021-11-23 NOTE — Assessment & Plan Note (Signed)
Stable.  S/p parathyroidectomy.  On vitamin D and calcium.

## 2021-11-23 NOTE — Patient Instructions (Signed)
I have placed a referral to sleep center for a sleep study for you. You can expect a phone call in 1-2 weeks to get scheduled. Please call our clinic if you have not heard from the specialist in 2 weeks.

## 2021-11-23 NOTE — Assessment & Plan Note (Signed)
Stable. Last platelet count WNL on 11/09/2021.

## 2021-11-23 NOTE — Assessment & Plan Note (Signed)
Remote, stable. No acute concerns.

## 2021-11-23 NOTE — Assessment & Plan Note (Addendum)
Undergoing treatment.  Follow up with [facility] on 6/22 (Dr. Nadeen Landau) as scheduled.

## 2021-11-23 NOTE — Assessment & Plan Note (Signed)
Stable ECHO in 08/2021. Asymptomatic.   Previously followed by Dr. Harlen Labs, no scheduled follow up. Plans to wait to follow up until she has completed tx for BCA.

## 2021-11-23 NOTE — Progress Notes (Addendum)
CC: Annual Exam    Vitals:    11/23/21 0836 11/23/21 0936   BP: 96/61 108/68   BP Location: Right arm Right arm   Patient Position: Sitting Sitting   Pulse: 92    Resp: 18    Temp: (!) 35.8 ?C (96.4 ?F)    TempSrc: Temporal    SpO2: 97%    Weight: 245 lb 3.2 oz (111.2 kg)    Height: 5' 3.15 (1.604 m)      Hearing / Vision Screen:   Vision Screening    Right eye Left eye Both eyes   Without correction 20/70 20/70-1 20/50-1   With correction          Annual Health Risk Assessment  The Medicare HRA is complete in Epic.          Screening:  Depression: PHQ-2: 0 (11/23/2021)  Anxiety: GAD-2: 0 (11/23/2021)  Substance Use: EtOH - EtOH SBIRT Negative (11/23/2021)  Drug - Drug SBIRT Negative (11/23/2021)  Fall Screen: Fall Risk Screen: Positive (11/23/2021)  Cognitive Assessment:   Patient does not require a PEG score Last Colon Cancer Screening: 05/04/2021 (up to date)    SDOH:  Food insecurity: low risk (11/23/2021)  Transportation needs: none (11/23/2021)  Housing stability: low risk (11/23/2021)    Smoking:   Counseling given: Yes    Vaccinations:  Flu: 04/14/2021 (completed)  Pneumovax: 04/14/2021 (completed)  Covid: 04/14/2021 (due today)    Ambulatory Nursing Form      Health Maintenance Due   Topic Date Due   . Tetanus Vaccine (1 - Tdap) Never done   . Zoster Vaccine (1 of 2) Never done   . COVID-19 Vaccine (3 - Booster for [employer] series) 06/09/2021

## 2021-11-23 NOTE — Progress Notes (Signed)
Brandy [name] is a 66 y.o. female who is seen today 11/23/2021 for her Medicare Wellness Visit.    Subjective    Acute complaints: none    Health Risk Assessment  The Health Risk Assessment was reviewed with the patient (click here to review). Pertinent positives discussed with the patient are:  Dental care: has dentures, not engaged in dental care. Has some irritation on left lower jaw from dentures.   OTC medication: taking calcium, vitamin D and aspirin 81 mg  Diet/exercise: limited fruits, veggies due to variable appetite 2/2 chemo. Able to afford fruits and vegetables. No daily exercise due to lack of energy 2/2 chemo.  Sleep/snoring: trouble sleeping due to mind wandering. Trouble staying asleep. Sleeps a lot during the day.     STOP/BANG  Do you SNORE loudly (louder than talking or loud enough to be heard through closed doors)? yes  Do you often feel TIRED, fatigued, or sleepy during daytime? yes  Has anyone OBSERVED you stop breathing during your sleep? No   Do you have or are you being treated for high blood PRESSURE? no  BMI more than 35kg/m2? yes  AGE over 66 years old? yes  NECK circumference > 16 inches (40cm)? yes  GENDER: Female? no    Walking/falls/pain: since starting chemo she has develop increased fatigue and weakness in arms and legs. Consequently, has started using a walker PRN. At baseline, prior to cancer dx, she had no limitations to her mobility and did not require an assistive device. Will stop to rest and sit on walker seat if she fatigues. No claudication, SOB or chest pain. Normal DEXA 10/2021.    Substance Use, Sexual History   Tobacco (cig/chew): Aela reports that she has never smoked. She has been exposed to tobacco smoke. She has never used smokeless tobacco.  EtOH: Carlota reports that she does not drink alcohol.  Drugs:  Nakea reports that she does not use drugs.  Sexual Activity: Emmajean reports that she is not currently sexually active and has had partner(s) who are female. She reports  using the following method of birth control/protection: Post-menopausal.    Objective    Medical / Surgical, Family History, Medications and Allergies  Marked as reviewed: 11/23/2021  9:12 AM.    Vitals   BP 108/68 (lg cuff sl) / P 92 / T (!) 35.8 ?C (96.4 ?F) / RR 18 / SpO2 97 % / Pain   5 / Wt 245 lb 3.2 oz (111.2 kg) / Ht 5' 3.15 (160.4 cm) / BMI 43.23 (Morbidly Obese)    Physical Exam      Physical Exam  Vitals reviewed.   Constitutional:       General: She is not in acute distress.     Appearance: Normal appearance. She is well-developed. She is obese. She is not diaphoretic.   HENT:      Head: Normocephalic and atraumatic.   Cardiovascular:      Rate and Rhythm: Normal rate and regular rhythm.      Heart sounds: Normal heart sounds. No murmur heard.  Pulmonary:      Effort: Pulmonary effort is normal.      Breath sounds: Normal breath sounds.   Skin:     General: Skin is warm and dry.   Neurological:      Mental Status: She is alert and oriented to person, place, and time.   Psychiatric:         Mood and Affect: Mood normal.  Behavior: Behavior normal.         Thought Content: Thought content normal.         Judgment: Judgment normal.       Last Vision Screen:        Health Care Maintenance      Cancer Screening    Last colon cancer screening: 05/04/2021 (up to date)    Last mammogram: 07/21/2021 (up to date)    Preventative Care    Last DXA: 10/25/2021 (completed)  Taking Ca / Vit D: Yes    Last Hep C Screen: 09/06/2016    Vaccines  Last Flu Vaccine: 04/14/2021 (completed)  Last COVID-19 Vaccine: 04/14/2021 (due today)  Last Tetanus Vaccine: None (due today)  Last Shingles Vaccine: None (due today)    Last Pneumococcal Vaccine: 04/14/2021 (completed)    Problem Based    Last Chol: 02/08/2018: 123  Last HDL: 02/08/2018: 30  Last LDL: 02/08/2018: 55  Last TG: 02/08/2018: 192.0      Last A1c: 5.5% (07/27/2020); 6.0% (01/26/2016); 5.2% (01/21/2016)     Assessment / Plan  These are possible HCC Gaps, and will disappear  from the note when you sign.      Addressed Diagnoses:  1. Encounter for annual wellness exam in Medicare patient    2. HER2-positive carcinoma of breast (HCC)  Overview:  Left breast, diagnosed in 08/2021.  Followed by Dr. Delories Heinz at [facility]. Last seen 6/1.  Undergoing chemo, has undergone 2 cycles.     Assessment & Plan:  Undergoing treatment.  Follow up with [facility] on 6/22 (Dr. Nadeen Landau) as scheduled.       3. Heparin induced thrombocytopenia (HCC)  Overview:  Dx in 2017.    Assessment & Plan:  Stable. Last platelet count WNL on 11/09/2021.       4. Non-ischemic cardiomyopathy (HCC)  Overview:  ? 02/2016 TTE: Sinus rhythm. Moderate concentric left ventricular hypertrophy (LVH).  Normal left ventricular (LV) size.  Below normal function.  Ejection fraction (EF) 55-60%. Normal right ventricular chamber size and function.  No evidence of pulmonary arterial hypertension but the inferior vena cava (IVC) appears enlarged, suggesting a central venous pressure (CVP) of 10 mmHg. 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 No significant pericardial or pleural effusion. Compared to prior echo from February 02, 2016, left ventricular (LV) function has improved. Right ventricular (RV) chamber size and function nearly normalized.  ? 01/2016 cath: Functionally Normal Coronary Anatomy  and Aortic Valve Disease by Dr. Crissie Figures  ? Medical management: ASA, lipitor, coreg, ?ace  ? 09/08/16 ASCVD 3.3% on lipitor 20 mg  ? 02/2018 TTE: Preserved EF of 69% with mild left ventricular hypertrophy. Mild right heart chambers enlargement with normal pulmonary artery pressures, mild to moderate left atrial enlargement. Mild dilation of ascending aorta.   ? 08/3021 ECHO: 1. Global systolic function: Left ventricular EF is 60 %. 2. 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 It is well seated and does not rock. Transvalvular gradients are within expected limits for the valve. Mean gradient 7.96 mm hg. There is no aortic regurgitation. 3. The mitral  valve has mild regurgitation. 4. Compared to the prior study in 2021, EF is now 60% from 69%.    Assessment & Plan:  Stable ECHO in 08/2021. Asymptomatic.   Previously followed by Dr. Harlen Labs, no scheduled follow up. Plans to wait to follow up until she has completed tx for BCA.         5. Demand ischemia (HCC)  Overview:  ?  01/2016 [facility] admission found to have trop ~0.08 with chest pain. Clean cath by Dr. Crissie Figures  ? Underwent an AVR in 2017  ? Previously followed by Dr. Harlen Labs    Assessment & Plan:  Stable ECHO in 08/2021.  Asymptomatic.  Declines referral back to [facility] at this time due to focus on BCA treatment.        6. Malignant neoplasm of hepatic flexure (HCC)  Overview:  2018 colonoscopy:  1) Colonoscopy with 3/4 circumference apple core lesion at full insertion of the colonoscope but likely distal to the Hepatic flexure 2) Hemorrhoids: mild internal 3) Mass: hot forceps biopsied times two without complications and tattooed times two proximal to the mass  Biopsy suspicious for intramucosal adenocarcinoma.  Underwent right hemicolectomy in 2018 with Dr. Oran Rein.    Assessment & Plan:  Historic.   Will follow up with Dr. Oran Rein in  04/2022 for surveillance colonoscopy.      7. History of amputation of right great toe [facility])  Overview:  2017 secondary to ischemic ulcer.   Dr. Margot Ables.    Assessment & Plan:  Remote, stable. No acute concerns.        8. Hyperparathyroidism (HCC)  Overview:  hypoechoic nodule in the inferior pole of the right thyroid, diagnosed 11/2017, following with Dr. Michiel Sites  Underwent parathyroidectomy in 2019, Dr. Jenelle Mages.   Lab Results   Component Value Date    TSH 3.63 03/31/2019     Lab Results   Component Value Date    PTH 46.5 07/27/2020    CALCIUM 9.8 11/09/2021    CAION 6.10 (HCrit) 11/21/2017    PHOS 3.4 07/27/2020         Assessment & Plan:  Stable.  S/p parathyroidectomy.  On vitamin D and calcium.       9. Snoring  Comments:  Positive STOP/BANG. Referral to sleep center placed.    Orders:  -     Referral to Sleep Studies (Dr. Abran Duke) (EXT)    10. Need for dental care  Comments:  Desires denture refitting.  Referral to [facility] to assist with dental care coordination.  Orders:  -     Referral to Care Management ([facility])    Anticipatory Guidance   1. Age-specific and individualized patient counseling done on:  Cognitive function  ? Home safety and fall risk reduction  ? Advanced Care Planning: Reports that she has addressed this; does not have AD on file, but does not want to complete one  ? Healthy diet and physical activity  ? Social engagement  ? Self-management and treatment to control or improve chronic diseases where applicable  ? Smoking and tobacco use cessation counseling: N/A  ? Substance use, including driving under the influence and recommendations of avoidance  ? Sexuality, including but not limited to STIs and sexual health  ? Dental health  2. Specific lifestyle goals:   ? Fight cancer!    Caryn Section, FNP  11/23/2021 8:45 AM    No follow-ups on file.      Outpatient Medications as of 11/23/2021:   .  aspirin, 81 mg, Oral, QAM  .  atorvastatin, 20 mg, Oral, Nightly (Patient taking differently: 20 mg, Oral, Nightly, Indications: hypercholesterolemia)  .  calcium carbonate/vitamin D3 (CALCIUM 600 + D,3, ORAL), 1 tablet, Oral, QAM  .  carvediloL, 6.25 mg, Oral, BID with meals (Patient taking differently: 6.25 mg, Oral, 2 times daily with meals, Indications: hypertension)  .  cholecalciferol (vitamin D3), 2,000 units, Oral, QAM  .  dexAMETHasone, 8 mg, Oral, Daily with breakfast  .  ondansetron, 8 mg, Oral, Q8H PRN  .  prochlorperazine, 10 mg, Oral, Q6H PRN  .  EPINEPHrine, 0.3 mg, Intramuscular, Once

## 2021-11-23 NOTE — Assessment & Plan Note (Signed)
Historic.   Will follow up with Dr. Oran Rein in  04/2022 for surveillance colonoscopy.

## 2021-11-28 NOTE — Progress Notes (Signed)
Outpatient Care Management [facility]) received a referral from [facility] for Brandy [name] for dental care needs.    11/28/21 11:47 AM Gwenlyn Perking called [facility] and they stated Brandy [name] has been assigned to [facility]. Gwenlyn Perking spoke with Brandy [name] regarding referral and she stated her dentures aren't fitting well and needs new one. I asked if she had talked with her dentist about this issue and she stated she doesn't have one. Gwenlyn Perking explained that she has one assigned through [facility]. Address and phone number given for [facility]. Encouraged patient to call our office if needs arise.

## 2021-11-30 ENCOUNTER — Other Ambulatory Visit: Admit: 2021-11-30

## 2021-11-30 DIAGNOSIS — R590 Localized enlarged lymph nodes: Secondary | ICD-10-CM

## 2021-11-30 DIAGNOSIS — N390 Urinary tract infection, site not specified: Secondary | ICD-10-CM

## 2021-11-30 LAB — COMPREHENSIVE METABOLIC PANEL
ALT - Alanine Aminotransferase: 17 IU/L (ref 5–33)
AST - Aspartate Aminotransferase: 24 IU/L (ref 5–32)
Albumin/Globulin Ratio: 1.1 — ABNORMAL LOW (ref 1.2–2.2)
Albumin: 3.5 g/dL (ref 3.5–5.0)
Alkaline Phosphatase: 87 IU/L (ref 35–105)
Anion Gap: 15.5 mmol/L (ref 9.0–18.0)
BUN / Creatinine Ratio: 17.2 (ref 12.0–20.0)
BUN: 11 mg/dL (ref 8–20)
Bilirubin Total: 0.6 mg/dL (ref 0.10–1.70)
CO2 - Carbon Dioxide: 21 mmol/L (ref 17–27)
Calcium: 9.2 mg/dL (ref 8.6–10.6)
Chloride: 105.5 mmol/L (ref 101.0–111.0)
Creatinine: 0.64 mg/dL (ref 0.60–1.30)
Glomerular Filtration Rate Estimate (Female): >90 mL/min/{1.73_m2} (ref >=60)
Glucose: 105 mg/dL (ref 74–106)
Osmolality Calculation: 282.9 mOsm/kg (ref 275.0–300.0)
Potassium: 3.7 mmol/L (ref 3.50–5.10)
Protein Total: 6.6 g/dL (ref 6.4–8.2)
Sodium: 142 mmol/L (ref 135–145)

## 2021-11-30 LAB — CELLAVISION DIFFERENTIAL
Bands %: 1 % (ref 0–2)
Bands, Absolute: 0 10*3/ÂµL
Basophils %: 1 % (ref 0–2)
Basophils, Absolute: 0 10*3/ÂµL (ref 0.0–0.1)
Eosinophils %: 0 % (ref 0–5)
Eosinophils, Absolute: 0 10*3/ÂµL (ref 0.0–0.4)
Lymphocytes %: 26 % (ref 20–40)
Lymphocytes, Absolute: 0.8 10*3/ÂµL — ABNORMAL LOW (ref 1.0–4.0)
Metamyelocytes %: 1 %
Metamyelocytes, Absolute: 0 10*3/ÂµL
Monocytes %: 9 % (ref 2–12)
Monocytes, Absolute: 0.3 10*3/ÂµL (ref 0.2–1.0)
Myelocytes %: 4 % — ABNORMAL HIGH
Myelocytes, Absolute: 0.1 10*3/ÂµL — ABNORMAL HIGH
Neutrophils %: 58 % (ref 50–74)
Neutrophils, Absolute: 1.8 10*3/ÂµL — ABNORMAL LOW (ref 2.0–7.4)
Platelet Estimate: ADEQUATE
Total Cells Counted: 100
WBC: 3.1 10*3/ÂµL — ABNORMAL LOW (ref 4.5–11.0)

## 2021-11-30 LAB — IRON AND TIBC
Iron Saturation: 24 % (ref 20–55)
Iron: 80 ug/dL (ref 28–170)
Total Iron Binding Capacity (Transferrin Saturation): 339 ug/dL (ref 261–487)
Transferrin: 237 mg/dL (ref 192–382)

## 2021-11-30 LAB — URINALYSIS WITH MICROSCOPIC
Ascorbic Acid, Urine: NEGATIVE
Bilirubin, Urine: NEGATIVE
Blood, Urine: NEGATIVE
Glucose, Urine: NEGATIVE mg/dL
Nitrite, Urine: POSITIVE — AB
Protein, Urine: 100 mg/dL — AB
RBC, Urine: 8 /HPF — AB (ref 0–4)
Renal/Transitional Epithelial, Urine: 3 /HPF (ref 0–2)
Specific Gravity, Urine: 1.018 (ref 1.005–1.030)
Squamous Epithelial, UA: 20 /HPF — AB (ref 0–4)
Urobilinogen, Urine: <2 mg/dL (ref <2.0)
WBC, Urine: >100 /HPF — AB (ref 0–4)
pH, UA: 5 (ref 5.0–7.5)

## 2021-11-30 LAB — CBC WITH AUTO DIFFERENTIAL
HCT: 21 % — ABNORMAL LOW (ref 35.0–48.0)
Hemoglobin: 6.8 g/dL — ABNORMAL LOW (ref 11.7–16.5)
MCH: 29.8 pg (ref 28.3–33.3)
MCHC: 32.4 g/dL — ABNORMAL LOW (ref 32.5–36.0)
MCV: 92 fL (ref 81.0–100.0)
MPV: 7.3 fL (ref 6.9–10.0)
Platelet Count: 297 10*3/ÂµL (ref 150–405)
RBC: 2.28 10*6/ÂµL — ABNORMAL LOW (ref 3.80–5.60)
RDW: 19 % — ABNORMAL HIGH (ref 11.7–16.1)
WBC: 3.1 10*3/ÂµL — ABNORMAL LOW (ref 4.5–11.0)

## 2021-11-30 LAB — FOLATE: Folate: 11.8 ng/mL (ref 4.8–24.2)

## 2021-11-30 LAB — LDH: LD: 360 IU/L — ABNORMAL HIGH (ref 135–214)

## 2021-11-30 LAB — VITAMIN B12: Vitamin B12: >2000 pg/mL — ABNORMAL HIGH (ref 232–1245)

## 2021-11-30 LAB — ABO/RH (HCLL): Rh (D): POSITIVE

## 2021-11-30 LAB — RETICULOCYTE COUNT
Reticulocyte Absolute Count: 0.182 10*6/ÂµL — ABNORMAL HIGH (ref 0.037–0.109)
Reticulocyte Relative Percent: 8 % — ABNORMAL HIGH (ref 1.0–2.3)

## 2021-11-30 LAB — ANTIBODY SCREEN (HCLL): Antibody Screen: NEGATIVE

## 2021-11-30 LAB — URINE CULTURE (SLM): Culture Result: >100000 — AB

## 2021-11-30 LAB — FERRITIN: Ferritin: 705 ng/mL — ABNORMAL HIGH (ref 13–150)

## 2021-11-30 LAB — HAPTOGLOBIN: Haptoglobin: 368 mg/dL — ABNORMAL HIGH (ref 30–200)

## 2021-11-30 NOTE — Progress Notes (Signed)
Lab called with crit HBG of 6.8.   RBC is 2.28  HCT is 21    Dr. Nadeen Landau notfied and ordered additional labs and requested 2 units of blood for pt.    Type and screen/match ordered, for 2 units of blood tomorrow 12/01/21 as well as other labs, UA, and FIT test.    This RN called pt and notified her to get labs and type and screen done today. Pt stated she would come in and stated she would like to get blood.       Per Dr. Nadeen Landau treatment to be held tomorrow and pushed a week.     This RN put in the order for 2 units of PRBC and called [facility] to schedule for tomorrow 12/01/2021.    Pt will see Dr. Nadeen Landau tomorrow 12/01/2021 still, and will then get her blood infusion at [facility] after this.

## 2021-11-30 NOTE — Progress Notes (Signed)
Placed order for 2units PRBC.

## 2021-12-01 ENCOUNTER — Other Ambulatory Visit: Admit: 2021-12-01

## 2021-12-01 ENCOUNTER — Ambulatory Visit

## 2021-12-01 ENCOUNTER — Ambulatory Visit: Admit: 2021-12-01 | Discharge: 2021-12-01 | Attending: RN

## 2021-12-01 ENCOUNTER — Inpatient Hospital Stay: Admit: 2021-12-01 | Discharge: 2022-01-11 | Attending: Medical Oncology

## 2021-12-01 ENCOUNTER — Ambulatory Visit: Admit: 2021-12-01 | Discharge: 2021-12-01 | Attending: Medical Oncology

## 2021-12-01 DIAGNOSIS — Z170 Estrogen receptor positive status [ER+]: Secondary | ICD-10-CM

## 2021-12-01 DIAGNOSIS — D649 Anemia, unspecified: Secondary | ICD-10-CM

## 2021-12-01 DIAGNOSIS — D500 Iron deficiency anemia secondary to blood loss (chronic): Secondary | ICD-10-CM

## 2021-12-01 DIAGNOSIS — R059 Cough, unspecified: Secondary | ICD-10-CM

## 2021-12-01 DIAGNOSIS — C50612 Malignant neoplasm of axillary tail of left female breast: Secondary | ICD-10-CM

## 2021-12-01 LAB — IMMUNOCHEMICAL FECAL OCCULT BLOOD: Fecal Occult Blood: NEGATIVE

## 2021-12-01 MED ORDER — acetaminophen (TYLENOL) tablet 650 mg
325 | Freq: Once | ORAL | Status: AC
Start: 2021-12-01 — End: 2021-12-01
  Administered 2021-12-01: 18:00:00 325 mg via ORAL

## 2021-12-01 MED ORDER — diphenhydrAMINE (BENADRYL) injection 25 mg
50 | Freq: Once | INTRAMUSCULAR | Status: AC
Start: 2021-12-01 — End: 2021-12-01
  Administered 2021-12-01: 18:00:00 50 mg via INTRAVENOUS

## 2021-12-01 MED ORDER — ciprofloxacin HCl (CIPRO) 500 mg tablet
500 | ORAL_TABLET | Freq: Two times a day (BID) | ORAL | 0 refills | 9.00000 days | Status: AC
Start: 2021-12-01 — End: 2021-12-11

## 2021-12-01 MED ORDER — heparin (PF) (HEP-LOCK) 100 unit/mL syringe
100 | INTRAVENOUS | Status: AC
Start: 2021-12-01 — End: ?

## 2021-12-01 NOTE — Progress Notes (Signed)
[facility] Progress Note    Patient Name: Brandy [name]      DOB: September 13, 1955 Chief Complaint   Patient presents with   . Blood     2 Units PRBCs          Vital Signs: BP 129/77   Pulse 92   Temp 36.2 ?C (97.1 ?F) (Temporal)   Resp 16   LMP  (LMP Unknown)   SpO2 94%   Genitourinary:   voiding w/o difficulty    Cognitive:   AAOx3, Sensation Intact, Appropriate and Speech clear/understandable  Musculoskeletal:  normal ROM    Cardiovascular:   no chest pain and fatigue  Integumentary:   Tugor WNL    Pulmonary:   resp reg, unlabored, WNL for age  Skin Wound Assessment:   NO          Gastrointestinal:   No GI distress  Pain:  Chronic, Location: back pain and Scale (0-10): mild         Nurse's Notes:         Patient seen in clinic today for 2un of PRBC's. Premeds given.       Discharge Instructions:  Yes   Reviewed Plan of Care F/U with Patient: Yes   Patient Next Appointment Date: Visit date not found

## 2021-12-01 NOTE — Patient Instructions (Addendum)
Instructions and Staff Orders :   Defer treatment today : Severe UTI     Lab studies : cbc, CMP, urinalysis w culture if indicated     Imaging :   Orders Placed This Encounter       EKG today      X-ray chest PA and lateral     Referral: NO    Medications/Rx :   Requested Prescriptions     Signed Prescriptions Disp Refills    ciprofloxacin HCl (CIPRO) 500 mg tablet 20 tablet 0     Sig: Take 1 tablet by mouth 2 times daily for 10 days.       Follow up :  one week

## 2021-12-01 NOTE — Progress Notes (Signed)
NAME:  Brandy [name]  MRN:  [id number]  DOB:  [birth year]    PROGRESS NOTE      REASON FOR FOLLOW-UP:     This is a 66 y.o. female who presents today for a follow-up regarding Malignant neoplasm of left breast in female, estrogen receptor positive, unspecified site of breast (HCC) [C50.912, Z17.0] .    HISTORY OF PRESENT ILLNESS:    HPI   Patient self-referred herself for a screening mammogram in February 2023: BI-RADS 0 incomplete:  This reported unusual mass in the left mid breast outer lower quadrant measuring 11 mm.  Associated with some calcifications and architectural distortion.  Adjacent possible satellite lesion measuring 7 mm.  Diagnostic ultrasound recommended with biopsy as needed.    07/29/2021: Ultrasound breast bilateral:  Left breast: Irregular 10 mm spiculated mass with irregular margins and posterior shadowing in the left breast at the 3 o'clock position.  Enlarged lymph nodes with thickened cortex in the left axilla measuring 4 cm.  BI-RADS 5-highly suggestive of malignancy.  Irregular mass in the left lateral breast with enlarged lymph node in the left axilla.    08/10/2021: Ultrasound-guided breast biopsy left:  No suspicious clusters of microcalcifications.  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  ER 99%, PR 95% HER2/neu 3+, Ki-67 70%, p53 70%    09/20/2021:  Left axillary lymph node needle core biopsy: Metastatic carcinoma similar to that in the left breast biopsy performed 4 weeks ago with similar positive ER 25% PR 2% and positive 3+ HER2 profile; Ki-67 40%, p53 35%.    09/20/2021 endometrial curettage:Benign inactive endometrium suggestive of benign endometrial polyp.    09/27/2021: Presents for discussion of neoadjuvant chemotherapy.      INTERVAL HISTORY:  12/01/2021:   The patient presents to the office today for a follow up  on her most recent lab work and to discuss her current treatment plan. She is accompanied by her husband. She states that she has been feeling very fatigued, weak, and has been sleeping more lately. She notes chills and a decrease in appetite. She notes that she eats when she is hungry. The patient reports overall weakness and some back pain. She states that she does not urinate frequently because it burns every time she urinates. She denies any blood in her urine and notes that he urine is yellow in color. She has not notified her PCP about the pain she experiences while urinating. She states that she remains well-hydrated at home.     The patient reports that she had fallen after her first round of treatment. She notes that she experiences weakness in her arms and legs. She reports that she has the shakes in her extremities. She denies taking any medication to mitigate her symptoms.     The patient denies chest pain, however, reports occasional heaviness in her chest.     10/20/2021:   Patient presents today for a scheduled infusion of TCHP, and to discuss recent labs. She was last seen here 09/27/21.   She had a right subclavian port placement by Dr. Truitt Merle on 10/11/21.     She is doing well overall and looking forward to start of her neoadjuvant therapy.  She has been informed about shortage and nonavailability of carboplatin and hence the need for change of neoadjuvant regimen      09/27/2021:  Patient presents today for a follow up visit to discuss recent imaging results.   She was last seen here 08/19/2021. She is scheduled for a PET scan in 10/2021.     She has been doing well. She has been experiencing vaginal spotting, but no bleeding.   Patient was admitted for a dilatation and curettage, and hysteroscopy with myosure on 09/16/2021.     She describes lower back pain and pain between her shoulders. She does not sleep well.     She expresses concern today with her biopsy results and prognosis, as she  describes a history of medical issues and complications, including heparin allergy, kidney issues, toe amputation, and finger amputation. No regular at home BP readings.     She takes ibuprofen for her back pain. She takes calcium and Vit D.       PAST MEDICAL HISTORY:    Past Medical History:   Diagnosis Date   . Arrhythmia    . CHF (congestive heart failure) (HCC)    . Colon cancer (HCC) 12/28/2016   . Colonic mass 11/28/2016    POSTOPERATIVE DIAGNOSIS 11/28/16 colonoscopy:  1) Colonoscopy with 3/4 circumference apple core lesion at full insertion of the colonoscope but likely distal to the Hepatic flexure 2) Hemorrhoids: mild internal 3) Mass: hot forceps biopsied times two without complications and tattooed times two proximal to the mass  Biopsy suspicious for intramucosal adenocarcinoma. Referral to Dr Oran Rein done and she   . Encounter for screening colonoscopy 12/05/2017   . History of blood transfusion    . HIT (heparin-induced thrombocytopenia) (HCC)    . Hyperlipidemia    . 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 Screening for colon cancer 10/04/2016    POSTOPERATIVE DIAGNOSIS 11/28/16 colonoscopy:  1) Colonoscopy with 3/4 circumference apple core lesion at full insertion of the colonoscope but likely distal to the Hepatic flexure 2) Hemorrhoids: mild internal 3) Mass: hot forceps biopsied times two without complications and tattooed times two proximal to the mass  Biopsy suspicious for intramucosal adenocarcinoma. Referral to Dr Oran Rein done and she   . Thyroid disease     Hyperparathyroid       PAST SURGICAL HISTORY:    Past Surgical History:   Procedure Laterality Date   . CARDIAC SURGERY  01/23/2016    AVR   . COLON SURGERY  2018    Hemicolectomy   . COLONOSCOPY N/A 05/04/2021    Procedure: COLONOSCOPY; W/ POSSIBLE BIOPSY & POLYPECTOMY;  Surgeon: Truitt Merle, MD;  Location: [facility] OR;  Service: [facility] Procedures;  Laterality: N/A;   . CORONARY  ANGIOPLASTY     . DILATION AND CURETTAGE OF UTERUS N/A 09/16/2021    Procedure: DILATATION AND CURETTAGE; HYSTEROSCOPY with myosure;  Surgeon: Conni Elliot, MD;  Location: [facility] OR;  Service: [facility] Procedures;  Laterality: N/A;   . PROCEDURE N/A 01/27/2016    Procedure: AORTIC VALVE REPLACEMENT; INTRA AORTIC BALLOON INSERTION;  Surgeon: Otho Bellows, MD;  Location: [facility] OR;  Service: Cardiology;  Laterality: N/A;   . PROCEDURE Right 03/10/2016    Procedure: AMPUTATION TOE;  Surgeon: Chrystie Nose, DPM;  Location: [facility] OR;  Service: Podiatry;  Laterality: Right;   . PROCEDURE N/A 11/28/2016    Procedure: COLONOSCOPY; BIOPSY;  Surgeon: Gailen Shelter, MD;  Location: [facility] OR;  Service: [facility] Procedures;  Laterality: N/A;  ink spot marking apple core mass proximal lesion distal to hepatic flexure   .  PROCEDURE Right 12/28/2016    Procedure: LAPAROPSCOPIC CONVERTED TO OPEN HAND ASSISTED RIGHT HEMI COLECTOMY;  Surgeon: Truitt Merle, MD;  Location: [facility] OR;  Service: [facility] Procedures;  Laterality: Right;  Dr. Oran Rein wants 3 hours   . PROCEDURE N/A 01/15/2018    Procedure: COLONOSCOPY; W/ POSSIBLE BIOPSY & POLYPECTOMY;  Surgeon: Truitt Merle, MD;  Location: [facility] OR;  Service: [facility] Procedures;  Laterality: N/A;   . PROCEDURE N/A 03/19/2018    Procedure: PARATHYROIDECTOMY; RECURRENT LARYNGEAL NERVE MONITORING, INTRAOPERATIVE PARATHYROID HORMONE;  Surgeon: Edmund Hilda, MD;  Location: [facility] OR;  Service: Ear Nose and Throat;  Laterality: N/A;   . TONSILLECTOMY     . TUBAL LIGATION  1980   . US GUIDED LYMPH NODE BIOPSY  09/15/2021    US GUIDED LYMPH NODE BIOPSY 09/15/2021 Jeanice Lim, MD [facility] [place] [facility]       CURRENT MEDICATIONS AND ALLERGIES:    Current Outpatient Medications   Medication Instructions   . acetaminophen (TYLENOL) 500 mg, Oral, Every 6 hours PRN   . aspirin 81 mg, Every morning   . atorvastatin (LIPITOR) 20 mg, Oral, Nightly   . CALCIUM ORAL 1 tablet, Oral, Every morning   . carvediloL (COREG) 6.25 mg, Oral, 2 times daily  with meals   . cholecalciferol, vitamin D3, (VITAMIN D3 ORAL) 1 tablet, Oral, Every morning   . EPINEPHrine (EPIPEN) 0.3 mg, Intramuscular, Once   . ibuprofen (ADVIL) 200 mg, Oral, 2 times daily PRN   . letrozole [facility]) 2.5 mg, Oral, Daily       Allergies   Allergen Reactions   . Heparin Anaphylaxis     Possible HIT   . Sulfa (Sulfonamide Antibiotics) Rash     In ICU for 3 days post dose         PATIENT HISTORY:    Social History     Tobacco Use   . Smoking status: Never     Passive exposure: Yes   . Smokeless tobacco: Never   . Tobacco comments:     SBIRT 02/13/2018 [facility] Father smoked in the home.    Vaping Use   . Vaping Use: Never used   Substance Use Topics   . Alcohol use: No   . Drug use: No       Family History   Problem Relation Age of Onset   . Colon cancer Mother         dx at older age   . Heart disease Father    . Heart disease Sister    . Cancer Brother    . Heart disease Maternal Aunt    . Ovarian cancer Maternal Aunt    . Cancer Maternal Uncle    . Heart disease Maternal Uncle    . Breast cancer Paternal Aunt        REVIEW OF SYSTEMS:      Review of Systems - Oncology  A 14 point review of systems was obtained and is essentially negative for any symptoms other than as outlined in interval history.    PHYSICAL EXAM:    Physical Exam     BP 155/88 (BP Location: Right arm)   Pulse 69   Temp 36.1 ?C (96.9 ?F)   Ht 5' 3 (1.6 m)   Wt 260 lb 8 oz (118.2 kg)   LMP  (LMP Unknown)   SpO2 92%   BMI 46.15 kg/m?   Body surface area is 2.29 meters squared.  Pain level: Pain Score: 0-No pain  Well developed , well nourished female in no acute distress, looks her stated age  HENT: Normocephalic, Atraumatic, PERLA, EOMI  Sclera anicteric, Conjunctiva pink  Oral mucosa: moist, no exudates, no tonsillar enlargement, no pharyngeal erythema  Neck :Supple, No JVD,no palpable lymphadenopathy, thyroid not palpable  Extremities: No edema, erythema, tenderness or deformities        LABS:    Lab Walk-In on  09/15/2021   Component Date Value   . CA 15-3 09/15/2021 26.0 (H)    . CEA 09/15/2021 11.1 (H)    . Sodium 09/15/2021 140    . Potassium 09/15/2021 3.79    . Chloride 09/15/2021 103.2    . CO2 - Carbon Dioxide 09/15/2021 25    . BUN 09/15/2021 12    . Creatinine 09/15/2021 0.73    . Glucose 09/15/2021 92    . Calcium 09/15/2021 9.3    . AST - Aspartate Aminotra* 09/15/2021 16    . ALT - Alanine Aminotrans* 09/15/2021 11    . Alkaline Phosphatase 09/15/2021 93    . Protein Total 09/15/2021 8.2    . Albumin 09/15/2021 4.1    . Bilirubin Total 09/15/2021 0.50    . Anion Gap 09/15/2021 11.8    . Albumin/Globulin Ratio 09/15/2021 1.0 (L)    . Glomerular Filtration Ra* 09/15/2021 >90    . GFR Additional Info 09/15/2021     . BUN / Creatinine Ratio 09/15/2021 16.4    . Osmolality Calculation 09/15/2021 278.8    . WBC 09/15/2021 5.0    . RBC 09/15/2021 4.25    . Hemoglobin 09/15/2021 12.4    . HCT 09/15/2021 38.0    . MCV 09/15/2021 89.4    . [facility] 09/15/2021 29.3    . MCHC 09/15/2021 32.8    . RDW 09/15/2021 14.8    . Platelet Count 09/15/2021 212    . MPV 09/15/2021 7.1    . Neutrophils % 09/15/2021 51    . Lymphocytes % 09/15/2021 39    . Monocytes % 09/15/2021 7    . Eosinophils % 09/15/2021 1    . Basophils % 09/15/2021 1    . Neutrophils, Absolute 09/15/2021 2.5    . Lymphocytes, Absolute 09/15/2021 1.9    . Monocytes, Absolute 09/15/2021 0.4    . Eosinophils, Absolute 09/15/2021 0.1    . Basophils, Absolute 09/15/2021 0.1    Admission on 09/15/2021, Discharged on 09/15/2021   Component Date Value   . Protime 09/15/2021 13.2    . INR 09/15/2021 1.1    . APTT 09/15/2021 32    . Platelet Count 09/15/2021 203    . Pathology Report 09/15/2021            IMAGING:  DXA bone density axial skeleton on 10/25/2021:   IMPRESSION:  1. Bone mineral density is within normal limits.    NM PET CT skull to thigh on 10/12/2021:   IMPRESSION:  1. No significant abnormal foci of radiotracer uptake over the breasts.  2. Minimal asymmetric  focal uptake in 2 lymph nodes in the left axilla, not greater than background activity.  This is a nonspecific finding which can be associated with prior surgery in the left breast, evidence of infection, and early metastatic   disease.  Consider follow-up PET scan in 3-6 months.  3. Coarse interstitial opacities and ground-glass airspace disease in the lung bases bilaterally.  Differential diagnosis includes, but is not limited to, viral infection, atypical pneumonia, chronic interstitial lung disease, versus secondary  changes of   atelectasis.  4. 9.4 cm fat containing mass of the musculature of the left medial thigh posteriorly, suggesting lipoma.  This is not associated with abnormal hypermetabolism on PET scan.    X-ray chest PA or AP 10/11/21:   FINDINGS:  LINES/TUBES: There is a right subclavian Port-A-Cath device with the tip of the catheter overlying the expected location of the cavoatrial junction.  HEART SIZE: There is slightly greater mild cardiomegaly.  There is evidence of previous coronary artery bypass grafting.  MEDIASTINUM: Stable median sternotomy changes along with a prosthetic heart valve.  LUNGS: Slightly low lung volumes.  Mild patchy and linear reticular opacities are seen within both mid and both lower lung zones which may reflect atelectasis/scarring although clinical correlation is recommended to exclude a developing pneumonia.  PLEURA: There is slight blunting of both lateral costophrenic angles which may reflect pleural thickening versus small bilateral pleural effusions.  OTHER: None.    NM PET CT skull to thigh 10/12/21:  IMPRESSION:  1. No significant abnormal foci of radiotracer uptake over the breasts.  2. Minimal asymmetric focal uptake in 2 lymph nodes in the left axilla, not greater than background activity.  This is a nonspecific finding which can be associated with prior surgery in the left breast, evidence of infection, and early metastatic   disease.  Consider follow-up PET scan  in 3-6 months.  3. Coarse interstitial opacities and ground-glass airspace disease in the lung bases bilaterally.  Differential diagnosis includes, but is not limited to, viral infection, atypical pneumonia, chronic interstitial lung disease, versus secondary changes of   atelectasis.  4. 9.4 cm fat containing mass of the musculature of the left medial thigh posteriorly, suggesting lipoma.  This is not associated with abnormal hypermetabolism on PET scan.        Echo 2D complete w doppler wo contrast 09/01/2021:   IMPRESSION  1. Global systolic function: Left ventricular EF is 60 %.  2. 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 It is well seated and does not rock. Transvalvular gradients are within expected limits for the valve. Mean  gradient 7.96 mm hg. There is no aortic regurgitation.  3. The mitral valve has mild regurgitation.  4. Compared to the prior study in 2021, EF is now 60% from 69%      ASSESSMENT AND PLAN:  Encounter Diagnoses   Name Primary?   . Malignant neoplasm of left breast in female, estrogen receptor positive, unspecified site of breast (HCC) Yes   . HER2-positive carcinoma of breast (HCC)    . Menopausal syndrome on hormone replacement therapy    . Long term (current) use of aromatase inhibitors    . Non-ischemic cardiomyopathy (HCC)      66 year old female with newly diagnosed locally advanced left breast ductal carcinoma, grade 3, with axillary involvement by biopsy, ER positive PR positive, HER2/neu 3+ by immunohistochemistry, clinical stage T1N 1-2 MX.  Per NCCN guidelines plan is to proceed with neoadjuvant trastuzumab and pertuzumab in combination with Taxotere and carboplatin.    Patient has been on neoadjuvant endocrine therapy with aromatase inhibitors since diagnosis 4 weeks ago.  A baseline bone density study is pending. Staging PET/CT scan is pending.    Discussed in detail plans for neoadjuvant chemotherapy with combination based on anti-HER2 agents with Taxotere  and carboplatin (most recent echocardiogram shows LVEF of 60%) for a total of 6 cycles on a every 3 week regimen with G-CSF support.  This will be followed  by definitive breast surgery, and continued adjuvant trastuzumab and pertuzumab therapy for a total of 52 weeks.  Patient would also likely be a candidate for adjuvant radiation therapy given significant axillary involvement consistent with metastatic disease.  PET-CT scan 10/12/2021 no significant abnormal foci of radiotracer uptake over the breasts and minimal asymmetric focal uptake in 2 lymph nodes in the left axilla, not greater than background activity.    In light of non availability of carboplatin, will change neoadjuvant regimen to adriamycin + cytoxan followed by taxol w herceptin + perjeta.   Will need monitoring of cardiac function ( has h/o non ischemic cardiomyopathy ).    Patient Instructions:   Instructions and Staff Orders :   Defer treatment today : Severe UTI      Lab studies : cbc, CMP, urinalysis w culture if indicated      Imaging :       Orders Placed This Encounter         EKG today      . X-ray chest PA and lateral      Referral: NO     Medications/Rx :   Requested Prescriptions             Signed Prescriptions Disp Refills   . ciprofloxacin HCl (CIPRO) 500 mg tablet 20 tablet 0       Sig: Take 1 tablet by mouth 2 times daily for 10 days.         Follow up :  one week    I have spent a total of 30  minutes on this patient's care today.  This time includes face-to-face time with the patient as well as time spent reviewing patient records, coordinating/communicating with care teams and documenting the patient's visit.      Lindley Magnus, MD    Scribe Attestation   Sign Off: Scribe: By adding my name here, I, Jarvis Morgan, Scribe, attest that this documentation has been prepared under the direction and in the presence of Dr. Nadeen Landau.    Attending: I, Dr. Nadeen Landau, personally performed the services described in this documentation. All medical  record entries made by the scribe were at my direction and in my presence. I have reviewed the chart and discharge instructions (if applicable) and agree that the record reflects my personal performance and is accurate and complete. Dr. Nadeen Landau.

## 2021-12-01 NOTE — Progress Notes (Signed)
Pt feels very tired and weak. She is cold all the time. Sometimes she has an appetite sometimes not. She sleeps most of the time.

## 2021-12-08 ENCOUNTER — Other Ambulatory Visit: Admit: 2021-12-08

## 2021-12-08 ENCOUNTER — Ambulatory Visit: Admit: 2021-12-08 | Attending: RN

## 2021-12-08 ENCOUNTER — Inpatient Hospital Stay: Admit: 2021-12-08 | Discharge: 2022-01-05 | Attending: Medical Oncology

## 2021-12-08 ENCOUNTER — Ambulatory Visit: Admit: 2021-12-08 | Discharge: 2021-12-08 | Attending: Medical Oncology

## 2021-12-08 DIAGNOSIS — Z170 Estrogen receptor positive status [ER+]: Secondary | ICD-10-CM

## 2021-12-08 DIAGNOSIS — C50612 Malignant neoplasm of axillary tail of left female breast: Secondary | ICD-10-CM

## 2021-12-08 DIAGNOSIS — R079 Chest pain, unspecified: Secondary | ICD-10-CM

## 2021-12-08 DIAGNOSIS — C50912 Malignant neoplasm of unspecified site of left female breast: Secondary | ICD-10-CM

## 2021-12-08 DIAGNOSIS — R590 Localized enlarged lymph nodes: Secondary | ICD-10-CM

## 2021-12-08 LAB — URINALYSIS WITH MICROSCOPIC, CULTURE IF INDICATED
Bilirubin, Urine: NEGATIVE
Blood, Urine: NEGATIVE
Glucose, Urine: NEGATIVE mg/dL
Ketones, Urine: NEGATIVE mg/dL
Nitrite, Urine: NEGATIVE
Protein, Urine: NEGATIVE mg/dL
RBC, Urine: 3 /HPF (ref 0–4)
Renal/Transitional Epithelial, Urine: 1 /HPF (ref 0–2)
Specific Gravity, Urine: 1.023 (ref 1.005–1.030)
Squamous Epithelial, UA: 15 /HPF — AB (ref 0–4)
Urobilinogen, Urine: <2 mg/dL (ref <2.0)
WBC, Urine: 24 /HPF — AB (ref 0–4)
pH, UA: 5 (ref 5.0–7.5)

## 2021-12-08 LAB — PROTEIN, TOTAL: Protein Total: 6.8 g/dL (ref 6.4–8.2)

## 2021-12-08 LAB — BILIRUBIN, TOTAL: Bilirubin Total: 0.8 mg/dL (ref 0.10–1.70)

## 2021-12-08 LAB — CBC WITH AUTO DIFFERENTIAL
Basophils %: 0 % (ref 0–2)
Basophils, Absolute: 0 10*3/ÂµL (ref 0.0–0.1)
Eosinophils %: 1 % (ref 0–5)
Eosinophils, Absolute: 0 10*3/ÂµL (ref 0.0–0.4)
HCT: 29.2 % — ABNORMAL LOW (ref 35.0–48.0)
Hemoglobin: 9.4 g/dL — ABNORMAL LOW (ref 11.7–16.5)
Lymphocytes %: 17 % — ABNORMAL LOW (ref 20–40)
Lymphocytes, Absolute: 1 10*3/ÂµL (ref 1.0–4.0)
MCH: 29.3 pg (ref 28.3–33.3)
MCHC: 32.1 g/dL — ABNORMAL LOW (ref 32.5–36.0)
MCV: 91.2 fL (ref 81.0–100.0)
MPV: 7.1 fL (ref 6.9–10.0)
Monocytes %: 13 % — ABNORMAL HIGH (ref 2–12)
Monocytes, Absolute: 0.7 10*3/ÂµL (ref 0.2–1.0)
Neutrophils %: 70 % (ref 50–74)
Neutrophils, Absolute: 4 10*3/ÂµL (ref 2.0–7.4)
Platelet Count: 211 10*3/ÂµL (ref 150–405)
RBC: 3.2 10*6/ÂµL — ABNORMAL LOW (ref 3.80–5.60)
RDW: 22.9 % — ABNORMAL HIGH (ref 11.7–16.1)
WBC: 5.7 10*3/ÂµL (ref 4.5–11.0)

## 2021-12-08 LAB — AST: AST - Aspartate Aminotransferase: 22 IU/L (ref 5–32)

## 2021-12-08 LAB — BASIC METABOLIC PANEL
Anion Gap: 14.4 mmol/L (ref 9.0–18.0)
BUN / Creatinine Ratio: 20.6 — ABNORMAL HIGH (ref 12.0–20.0)
BUN: 13 mg/dL (ref 8–20)
CO2 - Carbon Dioxide: 21 mmol/L (ref 17–27)
Calcium: 9.2 mg/dL (ref 8.6–10.6)
Chloride: 104.6 mmol/L (ref 101.0–111.0)
Creatinine: 0.63 mg/dL (ref 0.60–1.30)
Glomerular Filtration Rate Estimate (Female): >90 mL/min/{1.73_m2} (ref >=60)
Glucose: 108 mg/dL — ABNORMAL HIGH (ref 74–106)
Osmolality Calculation: 280 mOsm/kg (ref 275.0–300.0)
Potassium: 3.51 mmol/L (ref 3.50–5.10)
Sodium: 140 mmol/L (ref 135–145)

## 2021-12-08 LAB — ALT: ALT - Alanine Aminotransferase: 15 IU/L (ref 5–33)

## 2021-12-08 LAB — ALKALINE PHOSPHATASE: Alkaline Phosphatase: 81 IU/L (ref 35–105)

## 2021-12-08 LAB — URINE CULTURE (SLM)

## 2021-12-08 LAB — ALBUMIN: Albumin: 3.7 g/dL (ref 3.5–5.0)

## 2021-12-08 MED ORDER — cycloPHOSphamide (CYTOXAN) 900 mg in sodium chloride 0.9 % (NS) 250 mL chemo infusion
1 | Freq: Once | INTRAVENOUS | Status: AC
Start: 2021-12-08 — End: 2021-12-08
  Administered 2021-12-08 (×2): via INTRAVENOUS

## 2021-12-08 MED ORDER — pegfilgrastim (NEULASTA ONPRO) wearable injector 6 mg
6 | Freq: Once | SUBCUTANEOUS | Status: AC
Start: 2021-12-08 — End: 2021-12-08
  Administered 2021-12-08: 23:00:00 6 mg via SUBCUTANEOUS

## 2021-12-08 MED ORDER — DOXOrubicin (ADRIAMYCIN) 2 mg/mL chemo syringe 100 mg
2 | Freq: Once | INTRAVENOUS | Status: AC
Start: 2021-12-08 — End: 2021-12-08
  Administered 2021-12-08 (×4): 2 mg/m2 via INTRAVENOUS

## 2021-12-08 MED ORDER — HYDROcodone-acetaminophen (NORCO) 5-325 mg per tablet
5-325 | ORAL_TABLET | Freq: Four times a day (QID) | ORAL | 0 refills | 30.00000 days | Status: DC | PRN
Start: 2021-12-08 — End: 2022-03-31

## 2021-12-08 MED ORDER — fosnetupitant-palonosetron (AKYNZEO) 235 mg-0.25 mg in sodium chloride 0.9 % (NS) 50 mL IVPB
235 | Freq: Once | INTRAVENOUS | Status: AC
Start: 2021-12-08 — End: 2021-12-08
  Administered 2021-12-08 (×2): via INTRAVENOUS

## 2021-12-08 MED ORDER — iopamidoL (ISOVUE-370) 370 mg iodine /mL (76 %) injection 100 mL
370 | Freq: Once | INTRAVENOUS | Status: AC
Start: 2021-12-08 — End: 2021-12-08
  Administered 2021-12-08: 19:00:00 370 mL via INTRAVENOUS

## 2021-12-08 MED ORDER — dexAMETHasone (DECADRON) injection 10 mg
10 | Freq: Once | INTRAMUSCULAR | Status: AC
Start: 2021-12-08 — End: 2021-12-08
  Administered 2021-12-08: 21:00:00 10 mg via INTRAVENOUS

## 2021-12-08 MED ORDER — sodium chloride 0.9 % (NS) bolus 500 mL
Freq: Once | INTRAVENOUS | Status: AC
Start: 2021-12-08 — End: 2021-12-08
  Administered 2021-12-08 (×2): via INTRAVENOUS

## 2021-12-08 MED ORDER — levoFLOXacin (LEVAQUIN) 250 mg tablet
250 | ORAL_TABLET | Freq: Every day | ORAL | 0 refills | 5.00000 days | Status: DC
Start: 2021-12-08 — End: 2022-02-23

## 2021-12-08 NOTE — Progress Notes (Signed)
[facility] Infusion Nursing Note:    Brandy [name] is a 66 y.o. female    Problem:  Infusion IV (Doxorubicin/Cyclophosphamide )      Assessment/Actions:  There were no vitals filed for this visit.    Pre-Treatment Head to Toe Assessment  Orientation Level: Oriented X4  Learning Barriers: None  Emotional Status: Calm, In Pain  LOC: Alert  Neuro Symptoms: Fatigue  Difficulty Sleeping: Yes (Pt reports having difficultly sleeping last night specifially due to pain in her right ribs. Pt had imaging done today due to this.)  Mobility: Ambulatory (Due to fatigue, pt has used a wheel chair part of the day while going back and forth between the [facility] and hospital.)  Bilateral Breath Sounds: Other (Comment) (Pt denies bretahing issues but states deep breaths are painful due to the pain in her ribs reported before. Dr. aware.)  Cough: Moderate/Fair (Dr. Nadeen Landau aware)  Is patient on O2?: No  O2 Device: None (Room air)  Cardiac Regularity and Symptoms: Other (Comment) (Pt denies chest pain or other heart concerns at this time)  Skin Color: Appropriate for ethnicity  Skin Condition/Temp: Warm, Dry  GI/Abdomen:  (Pt denies concerns today)  Appetite: Fair (Pt reports a decrease in appetite but states she is trying to have small snacks even when she does not feel like eating.)  Genitourinary Symptoms: Other (Comment) (denies concerns at this time)  Pain Score:   8 (right rib pain, pt had imaging done today per Dr. Nadeen Landau due to this. Pt is also getting an RX for this pain today after treatment per Dr. Nadeen Landau.)  Pain Loc:  (right ribs)  Pain Edu?: Yes    Continuing/Changes:  Dx: Malignant neoplasm of left breast  Pt arrived with the primary c/o pain in her right side on her ribs and shoulder.   Pt denies n/v/d, fever, chills, SOB, rash, difficulty eating and drinking today.   Pt here for Doxorubicin and cyclophosphamide chemotherapy on C4 D1.   Pt saw Dr.Wali before treatment today. Pt had a UTI last but symptoms have improved. Please see  assessment information.  Pt pleasant and cooperative with care, able to make needs known, A&Ox4. Pt arrived with the ability get around Independent though pt was in a wheel chair for part of the day while going between the hospital and treatment center. Pt uses a walker.   Treatment plan reviewed and double checked. Okay to treat with a reduced dose per verbal from Dr. Nadeen Landau.        Evaluation:  Pt left chest port accessed with sterile technique using 1in huber needle, brisk blood return visualized and flushed with NS.   Pt completed treatment without issues. Pt chest port flushed, hep locked and de-accessed. Site was unremarkable and band aid applied. Pt discharged in stable condition. Pt to go to [facility] for pain RX for her right side pain after treatment and stated she was on her way there when she left with her partner today.     Sinda Du, RN

## 2021-12-08 NOTE — Progress Notes (Signed)
Pt is feeling better, she has more energy, less tired. She is still coughing a lot and now has sharp pain under her rt shoulder blade and in her rt side ribs. She has 2 days left of Cipro and says her UTI symptoms have resolved.

## 2021-12-08 NOTE — Progress Notes (Signed)
NAME:  Brandy [name]  MRN:  [id number]  DOB:  [birth year]    PROGRESS NOTE      REASON FOR FOLLOW-UP:     This is a 66 y.o. female who presents today for a follow-up regarding Malignant neoplasm of left breast in female, estrogen receptor positive, unspecified site of breast (HCC) [C50.912, Z17.0] .    HISTORY OF PRESENT ILLNESS:    HPI   Patient self-referred herself for a screening mammogram in February 2023: BI-RADS 0 incomplete:  This reported unusual mass in the left mid breast outer lower quadrant measuring 11 mm.  Associated with some calcifications and architectural distortion.  Adjacent possible satellite lesion measuring 7 mm.  Diagnostic ultrasound recommended with biopsy as needed.    07/29/2021: Ultrasound breast bilateral:  Left breast: Irregular 10 mm spiculated mass with irregular margins and posterior shadowing in the left breast at the 3 o'clock position.  Enlarged lymph nodes with thickened cortex in the left axilla measuring 4 cm.  BI-RADS 5-highly suggestive of malignancy.  Irregular mass in the left lateral breast with enlarged lymph node in the left axilla.    08/10/2021: Ultrasound-guided breast biopsy left:  No suspicious clusters of microcalcifications.  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  ER 99%, PR 95% HER2/neu 3+, Ki-67 70%, p53 70%    09/20/2021:  Left axillary lymph node needle core biopsy: Metastatic carcinoma similar to that in the left breast biopsy performed 4 weeks ago with similar positive ER 25% PR 2% and positive 3+ HER2 profile; Ki-67 40%, p53 35%.    09/20/2021 endometrial curettage:Benign inactive endometrium suggestive of benign endometrial polyp.    09/27/2021: Presents for discussion of neoadjuvant chemotherapy.      INTERVAL HISTORY:  12/08/2021:  The patient presents to the office today for a follow up  on her most recent lab work and to discuss her current treatment plan. She states that she is feeling better today. She has more energy and is less fatigued. She is still experiencing a persistent cough and reports a sharp pain under her right shoulder blade and on the right side of her ribs. She notes that he has 2 days left of ciprofloxacin HCl and her UTI symptoms have resolved. She denies new symptoms of tingling and numbness in her extremities and pain. She remains physically active and notes that she goes to the gym.     She states that she had completed an ECHO on 12/02/2021.     12/01/2021:   The patient presents to the office today for a follow up on her most recent lab work and to discuss her current treatment plan. She is accompanied by her husband. She states that she has been feeling very fatigued, weak, and has been sleeping more lately. She notes chills and a decrease in appetite. She notes that she eats when she is hungry. The patient reports overall weakness and some back pain. She states that she does not urinate frequently because it burns every time she urinates. She denies any blood in her urine and notes that he urine is yellow in color. She has not notified her PCP about the pain she experiences while urinating. She states that she remains well-hydrated at home.     The patient reports that she had fallen after her first round of treatment. She notes that she experiences weakness  in her arms and legs. She reports that she has the shakes in her extremities. She denies taking any medication to mitigate her symptoms.     The patient denies chest pain, however, reports occasional heaviness in her chest.     10/20/2021:   Patient presents today for a scheduled infusion of TCHP, and to discuss recent labs. She was last seen here 09/27/21.   She had a right subclavian port placement by Dr. Truitt Merle on 10/11/21.     She is doing well overall and looking forward to start of her neoadjuvant therapy.   She has been informed about shortage and nonavailability of carboplatin and hence the need for change of neoadjuvant regimen      09/27/2021:     Patient presents today for a follow up visit to discuss recent imaging results.   She was last seen here 08/19/2021. She is scheduled for a PET scan in 10/2021.     She has been doing well. She has been experiencing vaginal spotting, but no bleeding.   Patient was admitted for a dilatation and curettage, and hysteroscopy with myosure on 09/16/2021.     She describes lower back pain and pain between her shoulders. She does not sleep well.     She expresses concern today with her biopsy results and prognosis, as she describes a history of medical issues and complications, including heparin allergy, kidney issues, toe amputation, and finger amputation. No regular at home BP readings.     She takes ibuprofen for her back pain. She takes calcium and Vit D.       PAST MEDICAL HISTORY:    Past Medical History:   Diagnosis Date   . Arrhythmia    . CHF (congestive heart failure) (HCC)    . Colon cancer (HCC) 12/28/2016   . Colonic mass 11/28/2016    POSTOPERATIVE DIAGNOSIS 11/28/16 colonoscopy:  1) Colonoscopy with 3/4 circumference apple core lesion at full insertion of the colonoscope but likely distal to the Hepatic flexure 2) Hemorrhoids: mild internal 3) Mass: hot forceps biopsied times two without complications and tattooed times two proximal to the mass  Biopsy suspicious for intramucosal adenocarcinoma. Referral to Dr Oran Rein done and she   . Encounter for screening colonoscopy 12/05/2017   . History of blood transfusion    . HIT (heparin-induced thrombocytopenia) (HCC)    . Hyperlipidemia    . 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 Screening for colon cancer 10/04/2016    POSTOPERATIVE DIAGNOSIS 11/28/16 colonoscopy:  1) Colonoscopy with 3/4 circumference apple core lesion at full insertion of the colonoscope  but likely distal to the Hepatic flexure 2) Hemorrhoids: mild internal 3) Mass: hot forceps biopsied times two without complications and tattooed times two proximal to the mass  Biopsy suspicious for intramucosal adenocarcinoma. Referral to Dr Oran Rein done and she   . Thyroid disease     Hyperparathyroid       PAST SURGICAL HISTORY:    Past Surgical History:   Procedure Laterality Date   . CARDIAC SURGERY  01/23/2016    AVR   . COLON SURGERY  2018    Hemicolectomy   . COLONOSCOPY N/A 05/04/2021    Procedure: COLONOSCOPY; W/ POSSIBLE BIOPSY & POLYPECTOMY;  Surgeon: Truitt Merle, MD;  Location: [facility] OR;  Service: [facility] Procedures;  Laterality: N/A;   . CORONARY ANGIOPLASTY     . DILATION AND CURETTAGE OF UTERUS N/A 09/16/2021  Procedure: DILATATION AND CURETTAGE; HYSTEROSCOPY with myosure;  Surgeon: Conni Elliot, MD;  Location: [facility] OR;  Service: [facility] Procedures;  Laterality: N/A;   . PROCEDURE N/A 01/27/2016    Procedure: AORTIC VALVE REPLACEMENT; INTRA AORTIC BALLOON INSERTION;  Surgeon: Otho Bellows, MD;  Location: [facility] OR;  Service: Cardiology;  Laterality: N/A;   . PROCEDURE Right 03/10/2016    Procedure: AMPUTATION TOE;  Surgeon: Chrystie Nose, DPM;  Location: [facility] OR;  Service: Podiatry;  Laterality: Right;   . PROCEDURE N/A 11/28/2016    Procedure: COLONOSCOPY; BIOPSY;  Surgeon: Gailen Shelter, MD;  Location: [facility] OR;  Service: [facility] Procedures;  Laterality: N/A;  ink spot marking apple core mass proximal lesion distal to hepatic flexure   . PROCEDURE Right 12/28/2016    Procedure: LAPAROPSCOPIC CONVERTED TO OPEN HAND ASSISTED RIGHT HEMI COLECTOMY;  Surgeon: Truitt Merle, MD;  Location: [facility] OR;  Service: [facility] Procedures;  Laterality: Right;  Dr. Oran Rein wants 3 hours   . PROCEDURE N/A 01/15/2018    Procedure: COLONOSCOPY; W/ POSSIBLE BIOPSY & POLYPECTOMY;  Surgeon: Truitt Merle, MD;  Location: [facility] OR;  Service: [facility] Procedures;  Laterality: N/A;   . PROCEDURE N/A 03/19/2018    Procedure: PARATHYROIDECTOMY; RECURRENT  LARYNGEAL NERVE MONITORING, INTRAOPERATIVE PARATHYROID HORMONE;  Surgeon: Edmund Hilda, MD;  Location: [facility] OR;  Service: Ear Nose and Throat;  Laterality: N/A;   . TONSILLECTOMY     . TUBAL LIGATION  1980   . US GUIDED LYMPH NODE BIOPSY  09/15/2021    US GUIDED LYMPH NODE BIOPSY 09/15/2021 Jeanice Lim, MD [facility] [place] [facility]       CURRENT MEDICATIONS AND ALLERGIES:    Current Outpatient Medications   Medication Instructions   . acetaminophen (TYLENOL) 500 mg, Oral, Every 6 hours PRN   . aspirin 81 mg, Every morning   . atorvastatin (LIPITOR) 20 mg, Oral, Nightly   . CALCIUM ORAL 1 tablet, Oral, Every morning   . carvediloL (COREG) 6.25 mg, Oral, 2 times daily with meals   . cholecalciferol, vitamin D3, (VITAMIN D3 ORAL) 1 tablet, Oral, Every morning   . EPINEPHrine (EPIPEN) 0.3 mg, Intramuscular, Once   . ibuprofen (ADVIL) 200 mg, Oral, 2 times daily PRN   . letrozole [facility]) 2.5 mg, Oral, Daily       Allergies   Allergen Reactions   . Heparin Anaphylaxis     Possible HIT   . Sulfa (Sulfonamide Antibiotics) Rash     In ICU for 3 days post dose         PATIENT HISTORY:    Social History     Tobacco Use   . Smoking status: Never     Passive exposure: Yes   . Smokeless tobacco: Never   . Tobacco comments:     SBIRT 02/13/2018 [facility] Father smoked in the home.    Vaping Use   . Vaping Use: Never used   Substance Use Topics   . Alcohol use: No   . Drug use: No       Family History   Problem Relation Age of Onset   . Colon cancer Mother         dx at older age   . Heart disease Father    . Heart disease Sister    . Cancer Brother    . Heart disease Maternal Aunt    . Ovarian cancer Maternal Aunt    . Cancer Maternal Uncle    . Heart disease Maternal Uncle    .  Breast cancer Paternal Aunt        REVIEW OF SYSTEMS:      Review of Systems - Oncology  A 14 point review of systems was obtained and is essentially negative for any symptoms other than as outlined in interval history.    PHYSICAL EXAM:    Physical Exam     BP 155/88  (BP Location: Right arm)   Pulse 69   Temp 36.1 ?C (96.9 ?F)   Ht 5' 3 (1.6 m)   Wt 260 lb 8 oz (118.2 kg)   LMP  (LMP Unknown)   SpO2 92%   BMI 46.15 kg/m?   Body surface area is 2.29 meters squared.  Pain level: Pain Score: 0-No pain     Well developed , well nourished female in no acute distress, looks her stated age  HENT: Normocephalic, Atraumatic, PERLA, EOMI  Sclera anicteric, Conjunctiva pink  Oral mucosa: moist, no exudates, no tonsillar enlargement, no pharyngeal erythema  Neck :Supple, No JVD,no palpable lymphadenopathy, thyroid not palpable  Extremities: No edema, erythema, tenderness or deformities        LABS:    Lab Walk-In on 09/15/2021   Component Date Value   . CA 15-3 09/15/2021 26.0 (H)    . CEA 09/15/2021 11.1 (H)    . Sodium 09/15/2021 140    . Potassium 09/15/2021 3.79    . Chloride 09/15/2021 103.2    . CO2 - Carbon Dioxide 09/15/2021 25    . BUN 09/15/2021 12    . Creatinine 09/15/2021 0.73    . Glucose 09/15/2021 92    . Calcium 09/15/2021 9.3    . AST - Aspartate Aminotra* 09/15/2021 16    . ALT - Alanine Aminotrans* 09/15/2021 11    . Alkaline Phosphatase 09/15/2021 93    . Protein Total 09/15/2021 8.2    . Albumin 09/15/2021 4.1    . Bilirubin Total 09/15/2021 0.50    . Anion Gap 09/15/2021 11.8    . Albumin/Globulin Ratio 09/15/2021 1.0 (L)    . Glomerular Filtration Ra* 09/15/2021 >90    . GFR Additional Info 09/15/2021     . BUN / Creatinine Ratio 09/15/2021 16.4    . Osmolality Calculation 09/15/2021 278.8    . WBC 09/15/2021 5.0    . RBC 09/15/2021 4.25    . Hemoglobin 09/15/2021 12.4    . HCT 09/15/2021 38.0    . MCV 09/15/2021 89.4    . [facility] 09/15/2021 29.3    . MCHC 09/15/2021 32.8    . RDW 09/15/2021 14.8    . Platelet Count 09/15/2021 212    . MPV 09/15/2021 7.1    . Neutrophils % 09/15/2021 51    . Lymphocytes % 09/15/2021 39    . Monocytes % 09/15/2021 7    . Eosinophils % 09/15/2021 1    . Basophils % 09/15/2021 1    . Neutrophils, Absolute 09/15/2021 2.5    .  Lymphocytes, Absolute 09/15/2021 1.9    . Monocytes, Absolute 09/15/2021 0.4    . Eosinophils, Absolute 09/15/2021 0.1    . Basophils, Absolute 09/15/2021 0.1    Admission on 09/15/2021, Discharged on 09/15/2021   Component Date Value   . Protime 09/15/2021 13.2    . INR 09/15/2021 1.1    . APTT 09/15/2021 32    . Platelet Count 09/15/2021 203    . Pathology Report 09/15/2021            IMAGING:  X-ray chest  PA and lateral on 12/01/2021:   FINDINGS:  LINES/TUBES: Right-sided Port-A-Cath and post CABG changes.  LUNGS: No significant pulmonary parenchymal abnormalities and normal vascularity.  CARDIAC: Normal size cardiac silhouette.  MEDIASTINUM:  No superior mediastinal widening.  PLEURA: No evidence of pleural effusion or pneumothorax.  BONES: Unremarkable for age.  OTHER: Negative.    DXA bone density axial skeleton on 10/25/2021:   IMPRESSION:  1. Bone mineral density is within normal limits.    NM PET CT skull to thigh on 10/12/2021:   IMPRESSION:  1. No significant abnormal foci of radiotracer uptake over the breasts.  2. Minimal asymmetric focal uptake in 2 lymph nodes in the left axilla, not greater than background activity.  This is a nonspecific finding which can be associated with prior surgery in the left breast, evidence of infection, and early metastatic   disease.  Consider follow-up PET scan in 3-6 months.  3. Coarse interstitial opacities and ground-glass airspace disease in the lung bases bilaterally.  Differential diagnosis includes, but is not limited to, viral infection, atypical pneumonia, chronic interstitial lung disease, versus secondary changes of   atelectasis.  4. 9.4 cm fat containing mass of the musculature of the left medial thigh posteriorly, suggesting lipoma.  This is not associated with abnormal hypermetabolism on PET scan.    X-ray chest PA or AP 10/11/21:   FINDINGS:  LINES/TUBES: There is a right subclavian Port-A-Cath device with the tip of the catheter overlying the expected  location of the cavoatrial junction.  HEART SIZE: There is slightly greater mild cardiomegaly.  There is evidence of previous coronary artery bypass grafting.  MEDIASTINUM: Stable median sternotomy changes along with a prosthetic heart valve.  LUNGS: Slightly low lung volumes.  Mild patchy and linear reticular opacities are seen within both mid and both lower lung zones which may reflect atelectasis/scarring although clinical correlation is recommended to exclude a developing pneumonia.  PLEURA: There is slight blunting of both lateral costophrenic angles which may reflect pleural thickening versus small bilateral pleural effusions.  OTHER: None.    NM PET CT skull to thigh 10/12/21:  IMPRESSION:  1. No significant abnormal foci of radiotracer uptake over the breasts.  2. Minimal asymmetric focal uptake in 2 lymph nodes in the left axilla, not greater than background activity.  This is a nonspecific finding which can be associated with prior surgery in the left breast, evidence of infection, and early metastatic   disease.  Consider follow-up PET scan in 3-6 months.  3. Coarse interstitial opacities and ground-glass airspace disease in the lung bases bilaterally.  Differential diagnosis includes, but is not limited to, viral infection, atypical pneumonia, chronic interstitial lung disease, versus secondary changes of   atelectasis.  4. 9.4 cm fat containing mass of the musculature of the left medial thigh posteriorly, suggesting lipoma.  This is not associated with abnormal hypermetabolism on PET scan.        Echo 2D complete w doppler wo contrast 09/01/2021:   IMPRESSION  1. Global systolic function: Left ventricular EF is 60 %.  2. 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 It is well seated and does not rock. Transvalvular gradients are within expected limits for the valve. Mean  gradient 7.96 mm hg. There is no aortic regurgitation.  3. The mitral valve has mild regurgitation.  4. Compared to the  prior study in 2021, EF is now 60% from 69%      ASSESSMENT AND PLAN:  Encounter Diagnoses  Name Primary?   . Malignant neoplasm of left breast in female, estrogen receptor positive, unspecified site of breast (HCC) Yes   . HER2-positive carcinoma of breast (HCC)    . Menopausal syndrome on hormone replacement therapy    . Long term (current) use of aromatase inhibitors    . Non-ischemic cardiomyopathy (HCC)      66 year old female with newly diagnosed locally advanced left breast ductal carcinoma, grade 3, with axillary involvement by biopsy, ER positive PR positive, HER2/neu 3+ by immunohistochemistry, clinical stage T1N 1-2 MX.  Per NCCN guidelines plan is to proceed with neoadjuvant trastuzumab and pertuzumab in combination with Taxotere and carboplatin.    Patient has been on neoadjuvant endocrine therapy with aromatase inhibitors since diagnosis 4 weeks ago.  A baseline bone density study is pending. Staging PET/CT scan is pending.    Discussed in detail plans for neoadjuvant chemotherapy with combination based on anti-HER2 agents with Taxotere and carboplatin (most recent echocardiogram shows LVEF of 60%) for a total of 6 cycles on a every 3 week regimen with G-CSF support.  This will be followed by definitive breast surgery, and continued adjuvant trastuzumab and pertuzumab therapy for a total of 52 weeks.  Patient would also likely be a candidate for adjuvant radiation therapy given significant axillary involvement consistent with metastatic disease.  PET-CT scan 10/12/2021 no significant abnormal foci of radiotracer uptake over the breasts and minimal asymmetric focal uptake in 2 lymph nodes in the left axilla, not greater than background activity.    In light of non availability of carboplatin, will change neoadjuvant regimen to adriamycin + cytoxan followed by taxol w herceptin + perjeta.   Will need monitoring of cardiac function ( has h/o non ischemic cardiomyopathy ).    UTI: Patient's has 2 days left  on ciprofloxacin HCl. Her UTI symptoms have resolved.     Patient is compliant with an MRI of the brain to monitor rising tumor markers. An order has been placed for the patient to schedule an appointment at her convenience.       I have spent a total of 30  minutes on this patient's care today.  This time includes face-to-face time with the patient as well as time spent reviewing patient records, coordinating/communicating with care teams and documenting the patient's visit.      Lindley Magnus, MD    Scribe Attestation   Sign Off: Scribe: By adding my name here, I, Jarvis Morgan, Scribe, attest that this documentation has been prepared under the direction and in the presence of Dr. Nadeen Landau.    Attending: I, Dr. Nadeen Landau, personally performed the services described in this documentation. All medical record entries made by the scribe were at my direction and in my presence. I have reviewed the chart and discharge instructions (if applicable) and agree that the record reflects my personal performance and is accurate and complete. Dr. Nadeen Landau.

## 2021-12-23 ENCOUNTER — Inpatient Hospital Stay: Admit: 2021-12-23 | Discharge: 2022-01-27 | Attending: Medical Oncology

## 2021-12-23 DIAGNOSIS — C50612 Malignant neoplasm of axillary tail of left female breast: Secondary | ICD-10-CM

## 2021-12-28 ENCOUNTER — Other Ambulatory Visit: Admit: 2021-12-28

## 2021-12-28 DIAGNOSIS — R590 Localized enlarged lymph nodes: Secondary | ICD-10-CM

## 2021-12-28 LAB — CBC WITH AUTO DIFFERENTIAL
HCT: 25.2 % — ABNORMAL LOW (ref 35.0–48.0)
Hemoglobin: 8 g/dL — ABNORMAL LOW (ref 11.7–16.5)
MCH: 30.8 pg (ref 28.3–33.3)
MCHC: 31.9 g/dL — ABNORMAL LOW (ref 32.5–36.0)
MCV: 96.8 fL (ref 81.0–100.0)
MPV: 7.3 fL (ref 6.9–10.0)
Platelet Count: 341 10*3/ÂµL (ref 150–405)
RBC: 2.6 10*6/ÂµL — ABNORMAL LOW (ref 3.80–5.60)
RDW: 25.6 % — ABNORMAL HIGH (ref 11.7–16.1)
WBC: 4.8 10*3/ÂµL (ref 4.5–11.0)

## 2021-12-28 LAB — CELLAVISION DIFFERENTIAL
Basophils %: 2 % (ref 0–2)
Basophils, Absolute: 0.1 10*3/ÂµL (ref 0.0–0.1)
Eosinophils %: 0 % (ref 0–5)
Eosinophils, Absolute: 0 10*3/ÂµL (ref 0.0–0.4)
Lymphocytes %: 20 % (ref 20–40)
Lymphocytes, Absolute: 1 10*3/ÂµL (ref 1.0–4.0)
Monocytes %: 5 % (ref 2–12)
Monocytes, Absolute: 0.2 10*3/ÂµL (ref 0.2–1.0)
Neutrophils %: 73 % (ref 50–74)
Neutrophils, Absolute: 3.5 10*3/ÂµL (ref 2.0–7.4)
Platelet Estimate: ADEQUATE
Total Cells Counted: 100
WBC: 4.8 10*3/ÂµL (ref 4.5–11.0)

## 2021-12-28 LAB — COMPREHENSIVE METABOLIC PANEL
ALT - Alanine Aminotransferase: 10 IU/L (ref 5–33)
AST - Aspartate Aminotransferase: 20 IU/L (ref 5–32)
Albumin/Globulin Ratio: 1.3 (ref 1.2–2.2)
Albumin: 3.7 g/dL (ref 3.5–5.0)
Alkaline Phosphatase: 82 IU/L (ref 35–105)
Anion Gap: 13.2 mmol/L (ref 9.0–18.0)
BUN / Creatinine Ratio: 29.6 — ABNORMAL HIGH (ref 12.0–20.0)
BUN: 16 mg/dL (ref 8–20)
Bilirubin Total: 0.4 mg/dL (ref 0.10–1.70)
CO2 - Carbon Dioxide: 23 mmol/L (ref 17–27)
Calcium: 9.4 mg/dL (ref 8.6–10.6)
Chloride: 107.8 mmol/L (ref 101.0–111.0)
Creatinine: 0.54 mg/dL — ABNORMAL LOW (ref 0.60–1.30)
Glomerular Filtration Rate Estimate (Female): >90 mL/min/{1.73_m2} (ref >=60)
Glucose: 93 mg/dL (ref 74–106)
Osmolality Calculation: 287.7 mOsm/kg (ref 275.0–300.0)
Potassium: 4.03 mmol/L (ref 3.50–5.10)
Protein Total: 6.6 g/dL (ref 6.4–8.2)
Sodium: 144 mmol/L (ref 135–145)

## 2021-12-29 ENCOUNTER — Ambulatory Visit: Admit: 2021-12-29

## 2021-12-29 ENCOUNTER — Ambulatory Visit: Admit: 2021-12-29 | Discharge: 2022-01-20 | Attending: Medical Oncology

## 2021-12-29 DIAGNOSIS — C50612 Malignant neoplasm of axillary tail of left female breast: Secondary | ICD-10-CM

## 2021-12-29 DIAGNOSIS — C50919 Malignant neoplasm of unspecified site of unspecified female breast: Secondary | ICD-10-CM

## 2021-12-29 DIAGNOSIS — Z5111 Encounter for antineoplastic chemotherapy: Secondary | ICD-10-CM

## 2021-12-29 MED ORDER — DOXOrubicin (ADRIAMYCIN) 2 mg/mL chemo syringe 100 mg
2 | Freq: Once | INTRAVENOUS | Status: AC
Start: 2021-12-29 — End: 2021-12-29
  Administered 2021-12-29 (×4): 2 mg via INTRAVENOUS

## 2021-12-29 MED ORDER — dexAMETHasone (DECADRON) injection 10 mg
10 | Freq: Once | INTRAMUSCULAR | Status: AC
Start: 2021-12-29 — End: 2021-12-29
  Administered 2021-12-29: 19:00:00 10 mg via INTRAVENOUS

## 2021-12-29 MED ORDER — fosnetupitant-palonosetron (AKYNZEO) 235 mg-0.25 mg in sodium chloride 0.9 % (NS) 50 mL IVPB
235 | Freq: Once | INTRAVENOUS | Status: AC
Start: 2021-12-29 — End: 2021-12-29
  Administered 2021-12-29 (×2): via INTRAVENOUS

## 2021-12-29 MED ORDER — cycloPHOSphamide (CYTOXAN) 900 mg in sodium chloride 0.9 % (NS) 250 mL chemo infusion
1 | Freq: Once | INTRAVENOUS | Status: AC
Start: 2021-12-29 — End: 2021-12-29
  Administered 2021-12-29 (×2): via INTRAVENOUS

## 2021-12-29 MED ORDER — sodium chloride 0.9 % (NS) bolus 500 mL
Freq: Once | INTRAVENOUS | Status: AC
Start: 2021-12-29 — End: 2021-12-29
  Administered 2021-12-29 (×2): via INTRAVENOUS

## 2021-12-29 MED ORDER — pegfilgrastim (NEULASTA ONPRO) wearable injector 6 mg
6 | Freq: Once | SUBCUTANEOUS | Status: AC
Start: 2021-12-29 — End: 2021-12-29
  Administered 2021-12-29: 22:00:00 6 mg via SUBCUTANEOUS

## 2021-12-29 NOTE — Progress Notes (Signed)
The patient stated that she feels better, has more energy but has weakness and mosquito bites all over. She said she does not have any other new symptoms.

## 2021-12-29 NOTE — Progress Notes (Signed)
Brandy [name] is a 66 y.o. female, DOB May 05, 1956, who presents today for Follow-up (Fu Left breast cancer)  .    HPI :   Patient self-referred herself for a screening mammogram in February 2023: BI-RADS 0 incomplete:  This reported unusual mass in the left mid breast outer lower quadrant measuring 11 mm.  Associated with some calcifications and architectural distortion.  Adjacent possible satellite lesion measuring 7 mm.  Diagnostic ultrasound recommended with biopsy as needed.  ?  07/29/2021: Ultrasound breast bilateral:  Left breast: Irregular 10 mm spiculated mass with irregular margins and posterior shadowing in the left breast at the 3 o'clock position.  Enlarged lymph nodes with thickened cortex in the left axilla measuring 4 cm.  BI-RADS 5-highly suggestive of malignancy.  Irregular mass in the left lateral breast with enlarged lymph node in the left axilla.  ?  08/10/2021: Ultrasound-guided breast biopsy left:  No suspicious clusters of microcalcifications.  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  ER 99%, PR 95% HER2/neu 3+, Ki-67 70%, p53 70%  ?  09/20/2021:  Left axillary lymph node needle core biopsy: Metastatic carcinoma similar to that in the left breast biopsy performed 4 weeks ago with similar positive ER 25% PR 2% and positive 3+ HER2 profile; Ki-67 40%, p53 35%.  ?  09/20/2021 endometrial curettage:Benign inactive endometrium suggestive of benign endometrial polyp.  ?  09/27/2021: Presents for discussion of neoadjuvant chemotherapy.    Interval History:   12/29/2021:   The patient presents to the office today for a follow up on her most recent lab work and to discuss her current treatment plan. She is accompanied by her husband. She states that her energy levels have improved. She experiences occasional weakness, nausea, and mosquito  bites. She has tried many different bug sprays to prevent mosquito bites, however, none have worked well for her. Her appetite remains unchanged but she has been maintaining her weight. She denies mouth sores.     She states that she ambulates with a cane when she is walking long distances.            Review of Systems   Constitutional: Negative.    HENT: Negative.    Eyes: Negative.    Respiratory: Negative.    Cardiovascular: Negative.    Gastrointestinal: Negative.    Endocrine: Negative.    Genitourinary: Negative.    Musculoskeletal: Negative.    Allergic/Immunologic: Negative.    Neurological: Negative.    Hematological: Negative.    Psychiatric/Behavioral: Negative.           Vitals:    12/29/21 1051   BP: (!) 105/52   Pulse: 86   Temp: 36.4 ?C (97.6 ?F)   SpO2: 98%    Body mass index is 43.79 kg/m?Marland Kitchen  Physical Exam  Well developed , obese, female in no acute distress, looks her stated age  HENT: Normocephalic, Atraumatic, PERLA, EOMI  Sclera anicteric, Conjunctiva pink  Oral mucosa: moist, no exudates, no tonsillar enlargement, no pharyngeal erythema  Neck :Supple, No JVD,no palpable lymphadenopathy, thyroid not palpable  Chest : Clear to auscultation  Extremities: No edema, erythema, tenderness or deformities      IMAGING:  X-ray ribs right 2 view on 12/23/2021:   IMPRESSION:  1. No displaced fractures seen.    CT angiogram chest on 12/08/2021:   IMPRESSION:  -Ascending aorta  mildly obscured by motion and streak artifact; no evidence of dissection within this limitation.  No aneurysm.  -Nonspecific patchy areas of ground-glass noted in both lungs are likely due to atelectasis given expiratory phase, although superimposed infection, edema, and or inflammatory process is not excluded.  Recommend clinical correlation.  -Trace left pleural effusion.  -The tip of a right-sided chest port is in the right atrium.  -Multiple additional chronic/incidental findings as detailed above.    ASSESSMENT & PLAN:   We will  proceed with AC #4 as scheduled. Patient is scheduled for an ECHO on 01/09/2022.    patient is receiving 20% reduction of Cytoxan because of previous significant myelosuppression for with neutropenia and anemia requiring blood transfusion.    Having completed 4 cycles of Adriamycin and Cytoxan therapy she will now proceed to receive docetaxel and Herceptin every 3 weeks up to the point of proceeding for definitive surgery after which given the high likelihood of having axillary nodal involvement, consider combination of trastuzumab and pertuzumab for a total of 1 year.    Patient has a history of ischemic cardiomyopathy however most recent echocardiogram in March 2023 reported normal left ventricular ejection fraction of 60 to 65%.  Repeat echocardiogram is planned for end of July 2023 prior to start of Herceptin therapy.    Iron deficiency: Status post parenteral iron therapy.  With excellent early response.  We will continue to monitor closely.  Patient has had a colonoscopy in November 2022 with findings of precancerous rectal polyp and recommendations for follow-up colonoscopy within 12 months.  Most recently stool for occult blood in June 2023 was reported negative.    Patient Instructions:   Instructions and Staff Orders :   Proceed with AC #4     Next cycle in 3 weeks for Docetaxel + Trastuzumab Q 3 weeks      Lab studies : cbc, cmp      Imaging : ECHO on 01/09/2022      Referral: NO     Medications/Rx : No     Follow up : 3 weeks       Scribe Attestation:    Sign Off: By adding my name below, I, Jarvis Morgan, Scribe, attest that this documentation has been prepared under the direction and in the presence of Dr. Nadeen Landau.   Attending: I, Dr. Nadeen Landau, personally performed the services described in this documentation. All medical record entries made by the scribe were at my direction and in my presence. I have reviewed the chart and discharge instructions (if applicable) and agree that the record reflects my  personal performance and is accurate.

## 2021-12-29 NOTE — Patient Instructions (Addendum)
Instructions and Staff Orders :   Proceed with AC #4    Next cycle in 3 weeks for Docetaxel + Trastuzumab Q 3 weeks     Lab studies : cbc, cmp     Imaging : ECHO on 01/09/2022     Referral: NO    Medications/Rx : No    Follow up : 3 weeks

## 2021-12-29 NOTE — Progress Notes (Addendum)
[facility] Nursing Note:    Brandy [name] is a 66 y.o. female    Problem:  Chemotherapy [facility] C4D1)      Assessment/Actions:  There were no vitals filed for this visit.    Pre-Treatment Head to Toe Assessment  Orientation Level: Oriented X4  Learning Barriers: None  Emotional Status: Calm  LOC: Alert  Neuro Symptoms: Fatigue  Difficulty Sleeping: No  Mobility: Ambulatory  Moves all Extremities: Well  Is patient on O2?: No  Generalized Edema: None  Telemetry/Cardiac Monitor: No  Skin Color: Appropriate for ethnicity  Skin Condition/Temp: Warm, Dry  GI/Abdomen:  (denies n/v/d/c)  Appetite: Good  Genitourinary Symptoms:  (denies issues)  Pain Score: 0-No pain    Continuing/Changes:  Dx: metastatic L breast cancer ER/PR+ HER neg  Pt arrived with the primary c/o fatigue.   Pt denies n/v/d/c, fever, chills, SOB, coughing, rash, difficulty eating and drinking, n/t and palmar plantar erythema.   Pt here for [facility] chemotherapy on C4D1.   Pt pleasant and cooperative with care, able to make needs known, A&Ox4. Pt arrived with the ability get around Independent.    Treatment plan reviewed and double checked. Okay to treat.   PT to start Taxol, Herceptin and perjeta next cycle and pt informed.    Evaluation:  Pt R port accessed with sterile technique, brisk blood return visualized.   Pt completed treatment without issues. Pt chest port flushed and de-accessed. Pt discharged in stable condition.     Neulasta onpro applied at 1443 to LUE without issues. Dressing CDI and reinforced.        Chandra Batch, RN

## 2022-01-02 NOTE — Progress Notes (Signed)
AC (DOXORUBICIN(60) D1 / CYCLOPHOSPHAMIDE(600) D1), C1-4 FOLLOWED BY DOCETAXEL(100) D1 / TRASTUZUMAB D1,8,15 C5-8, FOLLOWED BY TRASTUZUMAB(6) D1 C9-22, 21 DAY CYCLES - BREAST  Authorized  Sent to [place] for scheduling    Per Wali, pt to get Docetaxel and Trastuzumab until surgery, then pt pertuzumab and trastuzumab.

## 2022-01-09 ENCOUNTER — Inpatient Hospital Stay: Admit: 2022-01-09 | Discharge: 2022-01-26 | Attending: Medical Oncology

## 2022-01-09 DIAGNOSIS — C50612 Malignant neoplasm of axillary tail of left female breast: Secondary | ICD-10-CM

## 2022-01-13 NOTE — Telephone Encounter (Signed)
Patient is requesting a refill of Carvedilol, last seen by [facility] on  11/23/2021, and no FYI restrictions.  Routed pended order to Bretta Bang, DO for review.     Future Appointments   Date Time Provider Department Center   01/19/2022  9:30 AM West Pugh, FNP [facility]   01/19/2022 10:00 AM [id number]        BP Readings from Last 3 Encounters:   12/29/21 (!) 105/52   12/08/21 129/90   12/01/21 112/69     Pulse Readings from Last 3 Encounters:   12/29/21 86   12/08/21 96   12/01/21 84       Last Labs:    LDL Calculated   Date Value Ref Range Status   02/08/2018 55 0 - 129 mg/dL Final     Triglyceride   Date Value Ref Range Status   02/08/2018 192.0 (H) <=150.0 mg/dL Final     Sodium   Date Value Ref Range Status   12/28/2021 144 135 - 145 mmol/L Final     Potassium   Date Value Ref Range Status   12/28/2021 4.03 3.50 - 5.10 mmol/L Final     AST - Aspartate Aminotransferase   Date Value Ref Range Status   12/28/2021 20 5 - 32 IU/L Final     ALT - Alanine Aminotransferase   Date Value Ref Range Status   12/28/2021 10 5 - 33 IU/L Final     BUN   Date Value Ref Range Status   12/28/2021 16 8 - 20 mg/dL Final     No results found for: HGBA1C, MICROALBUR, MALB24HUR  Glomerular Filtration Rate Estimate   Date Value Ref Range Status   03/06/2016 >60.0 >=60.0 mL/min/1.2m*2 Final     Glomerular Filtration Rate Estimate (Female)   Date Value Ref Range Status   02/28/2021 79 >=60 mL/min/1.48m*2 Final   10/04/2018 >60.0 >=60.0 mL/min/1.9m*2 Final

## 2022-01-18 ENCOUNTER — Other Ambulatory Visit: Admit: 2022-01-18

## 2022-01-18 DIAGNOSIS — C50612 Malignant neoplasm of axillary tail of left female breast: Secondary | ICD-10-CM

## 2022-01-18 LAB — CBC WITH AUTO DIFFERENTIAL
HCT: 25.3 % — ABNORMAL LOW (ref 35.0–48.0)
Hemoglobin: 8.3 g/dL — ABNORMAL LOW (ref 11.7–16.5)
MCH: 32.8 pg (ref 28.3–33.3)
MCHC: 32.7 g/dL (ref 32.5–36.0)
MCV: 100.3 fL — ABNORMAL HIGH (ref 81.0–100.0)
MPV: 6.7 fL — ABNORMAL LOW (ref 6.9–10.0)
Platelet Count: 428 10*3/ÂµL — ABNORMAL HIGH (ref 150–405)
RBC: 2.52 10*6/ÂµL — ABNORMAL LOW (ref 3.80–5.60)
RDW: 24 % — ABNORMAL HIGH (ref 11.7–16.1)
WBC: 5.6 10*3/ÂµL (ref 4.5–11.0)

## 2022-01-18 LAB — COMPREHENSIVE METABOLIC PANEL
ALT - Alanine Aminotransferase: 11 IU/L (ref 5–33)
AST - Aspartate Aminotransferase: 20 IU/L (ref 5–32)
Albumin/Globulin Ratio: 1.3 (ref 1.2–2.2)
Albumin: 4 g/dL (ref 3.5–5.0)
Alkaline Phosphatase: 95 IU/L (ref 35–105)
Anion Gap: 11.2 mmol/L (ref 9.0–18.0)
BUN / Creatinine Ratio: 31.6 — ABNORMAL HIGH (ref 12.0–20.0)
BUN: 18 mg/dL (ref 8–20)
Bilirubin Total: 0.5 mg/dL (ref 0.10–1.70)
CO2 - Carbon Dioxide: 24 mmol/L (ref 17–27)
Calcium: 9.4 mg/dL (ref 8.6–10.6)
Chloride: 105.8 mmol/L (ref 101.0–111.0)
Creatinine: 0.57 mg/dL — ABNORMAL LOW (ref 0.60–1.30)
Glomerular Filtration Rate Estimate (Female): >90 mL/min/{1.73_m2} (ref >=60)
Glucose: 99 mg/dL (ref 74–106)
Osmolality Calculation: 283.2 mOsm/kg (ref 275.0–300.0)
Potassium: 3.77 mmol/L (ref 3.50–5.10)
Protein Total: 7.1 g/dL (ref 6.4–8.2)
Sodium: 141 mmol/L (ref 135–145)

## 2022-01-18 LAB — CELLAVISION DIFFERENTIAL
Basophils %: 1 % (ref 0–2)
Basophils, Absolute: 0.1 10*3/ÂµL (ref 0.0–0.1)
Eosinophils %: 0 % (ref 0–5)
Eosinophils, Absolute: 0 10*3/ÂµL (ref 0.0–0.4)
Lymphocytes %: 14 % — ABNORMAL LOW (ref 20–40)
Lymphocytes, Absolute: 0.8 10*3/ÂµL — ABNORMAL LOW (ref 1.0–4.0)
Monocytes %: 8 % (ref 2–12)
Monocytes, Absolute: 0.4 10*3/ÂµL (ref 0.2–1.0)
Neutrophils %: 77 % — ABNORMAL HIGH (ref 50–74)
Neutrophils, Absolute: 4.3 10*3/ÂµL (ref 2.0–7.4)
Platelet Estimate: INCREASED — AB
Total Cells Counted: 100
WBC: 5.6 10*3/ÂµL (ref 4.5–11.0)

## 2022-01-19 ENCOUNTER — Ambulatory Visit: Admit: 2022-01-19 | Discharge: 2022-01-19 | Attending: FNP

## 2022-01-19 ENCOUNTER — Ambulatory Visit: Admit: 2022-01-19

## 2022-01-19 DIAGNOSIS — Z170 Estrogen receptor positive status [ER+]: Secondary | ICD-10-CM

## 2022-01-19 DIAGNOSIS — Z5111 Encounter for antineoplastic chemotherapy: Secondary | ICD-10-CM

## 2022-01-19 DIAGNOSIS — C50919 Malignant neoplasm of unspecified site of unspecified female breast: Secondary | ICD-10-CM

## 2022-01-19 MED ORDER — dexAMETHasone (DECADRON) 4 mg tablet
4 | ORAL_TABLET | Freq: Every day | ORAL | 0 refills | 6.00000 days | Status: DC
Start: 2022-01-19 — End: 2022-03-31

## 2022-01-19 MED ORDER — acetaminophen (TYLENOL) tablet 650 mg
325 | Freq: Once | ORAL | Status: AC
Start: 2022-01-19 — End: 2022-01-19
  Administered 2022-01-19: 18:00:00 325 mg via ORAL

## 2022-01-19 MED ORDER — dexAMETHasone (DECADRON) injection 10 mg
10 | Freq: Once | INTRAMUSCULAR | Status: AC
Start: 2022-01-19 — End: 2022-01-19
  Administered 2022-01-19: 18:00:00 10 mg via INTRAVENOUS

## 2022-01-19 MED ORDER — DOCEtaxel (TAXOTERE) 212 mg in sodium chloride 0.9 % (NS) 500 mL chemo infusion
80 | Freq: Once | INTRAVENOUS | Status: AC
Start: 2022-01-19 — End: 2022-01-19
  Administered 2022-01-19 (×2): via INTRAVENOUS

## 2022-01-19 MED ORDER — trastuzumab-dkst (OGIVRI) 441 mg in sodium chloride 0.9 % (NS) 250 mL infusion
420 | Freq: Once | INTRAVENOUS | Status: AC
Start: 2022-01-19 — End: 2022-01-19
  Administered 2022-01-19 (×2): via INTRAVENOUS

## 2022-01-19 MED ORDER — diphenhydrAMINE (BENADRYL) injection 50 mg
50 | Freq: Once | INTRAMUSCULAR | Status: AC
Start: 2022-01-19 — End: 2022-01-19
  Administered 2022-01-19: 18:00:00 50 mg via INTRAVENOUS

## 2022-01-19 MED ORDER — ondansetron (ZOFRAN) injection 8 mg
4 | Freq: Once | INTRAMUSCULAR | Status: AC
Start: 2022-01-19 — End: 2022-01-19
  Administered 2022-01-19: 18:00:00 4 mg via INTRAVENOUS

## 2022-01-19 NOTE — Progress Notes (Signed)
Pt states that she has questions regarding recent imaging. She has no other symptoms or concerns.

## 2022-01-19 NOTE — Progress Notes (Signed)
NAME:  Brandy [name]  MRN:  [id number]  DOB:  May 03, 1956  PROGRESS NOTE    REASON FOR VISIT:    Chief Complaint   Patient presents with   . Follow-up     3 wk f/u Breast cancer.      HISTORY OF PRESENT ILLNESS  This patient is a 66 y.o. female who presents for follow-up. The patient's care has been transferred to me. Pertinent hematology/oncology history has been obtained in part via review of medical records, confirmed with patient at time of visit.     HEMATOLOGY / ONCOLOGY HISTORY  Per Dr. Jacquelin Hawking progress note dated 10/20/2021:   ?  Patient self-referred herself for a screening mammogram in February 2023: BI-RADS 0 incomplete:This reported unusual mass in the left mid breast outer lower quadrant measuring 11 mm. ?Associated with some calcifications and architectural distortion. ?Adjacent possible satellite lesion measuring 7 mm. Diagnostic ultrasound recommended with biopsy as needed.  ?  07/29/2021: Ultrasound breast bilateral - BI-RADS 5-highly suggestive of malignancy. Left breast: Irregular 10 mm spiculated mass with irregular margins and posterior shadowing in the left breast at the 3 o'clock position. Enlarged lymph nodes with thickened cortex in the left axilla measuring 4 cm. rregular mass in the left lateral breast with enlarged lymph node in the left axilla.  ?  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 ?ER 99%, PR 95% HER2/neu 3+, Ki-67 70%, p53 70%  ?  09/20/2021: Left axillary lymph node needle core biopsy: Metastatic carcinoma similar to that in the left breast biopsy performed 4 weeks ago with similar positive ER 25% PR 2% and positive 3+ HER2 profile; Ki-67 40%, p53 35%.  ?  09/20/2021: Endometrial curettage:Benign inactive endometrium suggestive of benign endometrial polyp.  ?  10/20/2021: Cycle #1 AC +  neulasta onpro  ?  10/21/2021: Letrozole discontinued  ?  11/10/2021: Cycle #2 AC + neulasta onpro  12/08/2021: Cycle #3 AC + neulasta onpro  12/29/2021: Cycle #4 AC + neulasta onpro    01/09/2022: ECHOCARDIOGRAM  1. Left ventricular cavity size is normal. Left ventricular wall thickness is mildly increased.  2. Global systolic function: There is normal global left ventricular contractility. Left ventricular EF 55 - 60 %.  3. Regional systolic function: Wall motion: No regional wall motion abnormalities were noted.  4. Diastolic function: The diastolic filling pattern is indeterminate.  5. The right ventricle appears to be in the upper limits of normal.  6. The sinuses of Valsalva appears dilated, measuring 4.1 cm. The ascending aorta appears dilated, measuring 3.9 cm.  7. 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 It is well seated and does not rock. Transvalvular gradients are within expected limits for the valve.  8. The mitral valve has mild regurgitation.  9. The tricuspid valve has trace/physiological regurgitation.  10. The RVSP is estimated at 39 mmHg, which is mildly elevated.  11. There is no pericardial effusion.    01/19/2022 INTERVAL HISTORY: patient presents for follow-up and for cycle #1 Docetaxel (21 day cycle)  + Trastuzumab (weekly x12). She reports feeling better today and has been working on regaining strength in her lower extremities. Appetite is good. No nausea/vomiting. Occasional constipation, which she manages with over-the-counter stool softeners. Patient scheduled to start Docetaxel and Trastuzumab today. Reviewed medication side effects and dosing schedule with the patient. Discussed proper use of  prn antiemetics, as well as scheduled dexamethasone dosing surrounding treatment days. Reviewed current lab and echo results with the patient.     PAST MEDICAL HISTORY:  Past Medical History:   Diagnosis Date   . Arrhythmia    . CHF (congestive heart failure) (HCC)    . Colon cancer  (HCC) 12/28/2016   . Colonic mass 11/28/2016    POSTOPERATIVE DIAGNOSIS 11/28/16 colonoscopy:  1) Colonoscopy with 3/4 circumference apple core lesion at full insertion of the colonoscope but likely distal to the Hepatic flexure 2) Hemorrhoids: mild internal 3) Mass: hot forceps biopsied times two without complications and tattooed times two proximal to the mass  Biopsy suspicious for intramucosal adenocarcinoma. Referral to Dr Oran Rein done and she   . Encounter for screening colonoscopy 12/05/2017   . History of blood transfusion    . HIT (heparin-induced thrombocytopenia) (HCC)    . Hyperlipidemia    . 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 Screening for colon cancer 10/04/2016    POSTOPERATIVE DIAGNOSIS 11/28/16 colonoscopy:  1) Colonoscopy with 3/4 circumference apple core lesion at full insertion of the colonoscope but likely distal to the Hepatic flexure 2) Hemorrhoids: mild internal 3) Mass: hot forceps biopsied times two without complications and tattooed times two proximal to the mass  Biopsy suspicious for intramucosal adenocarcinoma. Referral to Dr Oran Rein done and she   . Thyroid disease     Hyperparathyroid     PAST SURGICAL HISTORY:  Past Surgical History:   Procedure Laterality Date   . CARDIAC SURGERY  01/23/2016    AVR   . COLON SURGERY  2018    Hemicolectomy   . COLONOSCOPY N/A 05/04/2021    Procedure: COLONOSCOPY; W/ POSSIBLE BIOPSY & POLYPECTOMY;  Surgeon: Truitt Merle, MD;  Location: [facility] OR;  Service: [facility] Procedures;  Laterality: N/A;   . CORONARY ANGIOPLASTY     . DILATION AND CURETTAGE OF UTERUS N/A 09/16/2021    Procedure: DILATATION AND CURETTAGE; HYSTEROSCOPY with myosure;  Surgeon: Conni Elliot, MD;  Location: [facility] OR;  Service: [facility] Procedures;  Laterality: N/A;   . PROCEDURE N/A 01/27/2016    Procedure: AORTIC VALVE REPLACEMENT; INTRA AORTIC BALLOON INSERTION;  Surgeon: Otho Bellows, MD;  Location: [facility] OR;  Service:  Cardiology;  Laterality: N/A;   . PROCEDURE Right 03/10/2016    Procedure: AMPUTATION TOE;  Surgeon: Chrystie Nose, DPM;  Location: [facility] OR;  Service: Podiatry;  Laterality: Right;   . PROCEDURE N/A 11/28/2016    Procedure: COLONOSCOPY; BIOPSY;  Surgeon: Gailen Shelter, MD;  Location: [facility] OR;  Service: [facility] Procedures;  Laterality: N/A;  ink spot marking apple core mass proximal lesion distal to hepatic flexure   . PROCEDURE Right 12/28/2016    Procedure: LAPAROPSCOPIC CONVERTED TO OPEN HAND ASSISTED RIGHT HEMI COLECTOMY;  Surgeon: Truitt Merle, MD;  Location: [facility] OR;  Service: [facility] Procedures;  Laterality: Right;  Dr. Oran Rein wants 3 hours   . PROCEDURE N/A 01/15/2018    Procedure: COLONOSCOPY; W/ POSSIBLE BIOPSY & POLYPECTOMY;  Surgeon: Truitt Merle, MD;  Location: [facility] OR;  Service: [facility] Procedures;  Laterality: N/A;   . PROCEDURE N/A 03/19/2018    Procedure: PARATHYROIDECTOMY; RECURRENT LARYNGEAL NERVE MONITORING, INTRAOPERATIVE PARATHYROID HORMONE;  Surgeon: Edmund Hilda, MD;  Location: [facility] OR;  Service: Ear Nose and Throat;  Laterality: N/A;   . PROCEDURE Left 10/11/2021    Procedure: RIGHT SUBCLAVIAN PORT PLACEMENT;  Surgeon: Truitt Merle, MD;  Location:  [facility] OR;  Service: [facility] Procedures;  Laterality: Left;   . TONSILLECTOMY     . TUBAL LIGATION  1980   . US GUIDED LYMPH NODE BIOPSY  09/15/2021    US GUIDED LYMPH NODE BIOPSY 09/15/2021 Jeanice Lim, MD [facility] [place] [facility]     CURRENT MEDICATIONS AND ALLERGIES:    Current Outpatient Medications:   .  aspirin 81 MG EC tablet, Take 81 mg by mouth every morning., Disp: , Rfl:   .  atorvastatin (LIPITOR) 20 mg tablet, Take 1 tablet by mouth every night at bedtime. (Patient taking differently: Take 20 mg by mouth every night at bedtime.), Disp: 90 tablet, Rfl: 3  .  calcium carbonate/vitamin D3 (CALCIUM 600 + D,3, ORAL), Take 1 tablet by mouth every morning., Disp: , Rfl:   .  carvediloL (COREG) 6.25 mg tablet, Take 1 tablet by mouth 2 times daily with meals., Disp: 180 tablet, Rfl:  1  .  cholecalciferol, vitamin D3, (VITAMIN D3) 50 mcg (2,000 unit) Tab tablet, Take 2,000 units by mouth every morning., Disp: , Rfl:   .  dexAMETHasone (DECADRON) 4 mg tablet, Take 2 tablets by mouth daily with breakfast. Take 8mg  by mouth once daily for three days starting day 2 of each cycle., Disp: 24 tablet, Rfl: 0  .  EPINEPHrine (EPIPEN) 0.3 mg/0.3 mL AtIn injection, Inject 0.3 mL into the muscle once for 1 dose., Disp: 0.3 mL, Rfl: 0  .  HYDROcodone-acetaminophen (NORCO) 5-325 mg per tablet, Take 1 tablet by mouth every 6 hours as needed for Pain., Disp: 60 tablet, Rfl: 0  .  levoFLOXacin (LEVAQUIN) 250 mg tablet, Take 1 tablet by mouth once daily., Disp: 5 tablet, Rfl: 0  .  ondansetron (ZOFRAN-ODT) 8 mg disintegrating tablet, Take 1 tablet by mouth every 8 hours as needed for Nausea., Disp: 30 tablet, Rfl: 3  .  prochlorperazine (COMPAZINE) 10 mg tablet, Take 1 tablet by mouth every 6 hours as needed. For break through nausea/vomiting., Disp: 30 tablet, Rfl: 3  Allergies   Allergen Reactions   . Bee Sting [Allergen Ext-Venom-Honey Bee] Anaphylaxis     Patient reports an epi-pen    . Heparin Anaphylaxis     Possible HIT   . Sulfa (Sulfonamide Antibiotics) Rash     In ICU for 3 days post dose       SOCIAL AND FAMILY HISTORY:  Social History     Tobacco Use   . Smoking status: Never     Passive exposure: Yes   . Smokeless tobacco: Never   Vaping Use   . Vaping Use: Never used   Substance Use Topics   . Alcohol use: No   . Drug use: No     Family History   Problem Relation Age of Onset   . Colon cancer Mother         dx at older age   . Heart disease Father    . Heart disease Sister    . Cancer Brother    . Heart disease Maternal Aunt    . Ovarian cancer Maternal Aunt    . Cancer Maternal Uncle    . Heart disease Maternal Uncle    . Breast cancer Paternal Aunt        REVIEW OF SYSTEMS: ROS complete and is unremarkable unless otherwise noted in HPI.   Review of Systems   Constitutional: Positive for fatigue.  Negative for chills and fever.   Respiratory: Positive for shortness of breath. Negative  for hemoptysis and wheezing.    Cardiovascular: Positive for leg swelling. Negative for palpitations.   Gastrointestinal: Negative for abdominal pain, nausea and vomiting.   Genitourinary: Negative.     Musculoskeletal: Positive for arthralgias.   Skin:        Multiple mosquito bites to bilateral upper extremities   Psychiatric/Behavioral: Negative for sleep disturbance.       PHYSICAL EXAM:  Vitals:    01/19/22 0922   BP: (!) 133/96   BP Location: Right arm   Patient Position: Sitting   Pulse: 86   Temp: (!) 35.7 ?C (96.3 ?F)   TempSrc: Temporal   SpO2: 95%   Weight: 242 lb 11.2 oz (110.1 kg)   Height: 5' 3 (1.6 m)      Physical Exam  Vitals and nursing note reviewed.   Constitutional:       General: She is not in acute distress.     Appearance: Normal appearance.   HENT:      Head: Normocephalic and atraumatic.      Mouth/Throat:      Pharynx: Oropharynx is clear.   Eyes:      General: No scleral icterus.     Conjunctiva/sclera: Conjunctivae normal.   Cardiovascular:      Rate and Rhythm: Normal rate and regular rhythm.      Pulses: Normal pulses.      Heart sounds: Normal heart sounds.   Pulmonary:      Effort: Pulmonary effort is normal. No respiratory distress.      Breath sounds: Normal breath sounds.   Abdominal:      General: There is no distension.   Musculoskeletal:         General: Normal range of motion.      Cervical back: Normal range of motion.   Skin:     General: Skin is warm and dry.   Neurological:      General: No focal deficit present.      Mental Status: She is alert and oriented to person, place, and time.   Psychiatric:         Mood and Affect: Mood normal.         Behavior: Behavior normal.     LABS:  Lab Walk-In on 01/18/2022   Component Date Value   . WBC 01/18/2022 5.6    . RBC 01/18/2022 2.52 (L)    . Hemoglobin 01/18/2022 8.3 (L)    . HCT 01/18/2022 25.3 (L)    . MCV 01/18/2022 100.3 (H)    . MCH  01/18/2022 32.8    . MCHC 01/18/2022 32.7    . RDW 01/18/2022 24.0 (H)    . Platelet Count 01/18/2022 428 (H)    . MPV 01/18/2022 6.7 (L)    . Sodium 01/18/2022 141    . Potassium 01/18/2022 3.77    . Chloride 01/18/2022 105.8    . CO2 - Carbon Dioxide 01/18/2022 24    . BUN 01/18/2022 18    . Creatinine 01/18/2022 0.57 (L)    . Glucose 01/18/2022 99    . Calcium 01/18/2022 9.4    . AST - Aspartate Aminotra* 01/18/2022 20    . ALT - Alanine Aminotrans* 01/18/2022 11    . Alkaline Phosphatase 01/18/2022 95    . Protein Total 01/18/2022 7.1    . Albumin 01/18/2022 4.0    . Bilirubin Total 01/18/2022 0.50    . Anion Gap 01/18/2022 11.2    . Albumin/Globulin Ratio 01/18/2022 1.3    .  Glomerular Filtration Ra* 01/18/2022 >90    . GFR Additional Info 01/18/2022     . BUN / Creatinine Ratio 01/18/2022 31.6 (H)    . Osmolality Calculation 01/18/2022 283.2    . WBC 01/18/2022 5.6    . Neutrophils % 01/18/2022 77 (H)    . Lymphocytes % 01/18/2022 14 (L)    . Monocytes % 01/18/2022 8    . Eosinophils % 01/18/2022 0    . Basophils % 01/18/2022 1    . Neutrophils, Absolute 01/18/2022 4.3    . Lymphocytes, Absolute 01/18/2022 0.8 (L)    . Monocytes, Absolute 01/18/2022 0.4    . Eosinophils, Absolute 01/18/2022 0.0    . Basophils, Absolute 01/18/2022 0.1    . Total Cells Counted 01/18/2022 100    . Platelet Estimate 01/18/2022 Increased (A)    . Anisocytosis 01/18/2022 2+ (A)    . Polychromasia 01/18/2022 1+ (A)    . Macrocytes 01/18/2022 1+ (A)    . Microcytes 01/18/2022 1+ (A)       IMAGING:  No results found for this or any previous visit from the past 14 days.    ASSESSMENT / MEDICAL DECISION MAKING  Brandy [name] is a 66 y.o. female who is seen today for follow-up. All current laboratory and/or imaging results reviewed with the patient. 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 Patient underwent 4 cycles of A/C per NCCN guidelines. Course of treatment interrupted due to cytopenias and immunosuppression. Presenting today for cycle # Docetaxel + Trastuzumab. Plan for Docetaxel every 3 weeks x4 cycles, and Trastuzumab weekly x12 doses, then every 3 weeks until completion at 52 weeks.     ECOG SCORE: 1    PLAN  1. Treatment: proceed with cycle #1 Docetaxel + Trastuzumab   2. Labs: CBC, CMP prior to f/u   3. Additional Recommendations:   o Maintain a well-balanced diet  o Engage in daily physical activity as tolerated   4. Follow-up: return for visit with MD or NP in 3 weeks, or sooner if warranted.     Please call the [facility] if any further questions or concerns arise    I spent 35 minutes with the patient, including physical examination, face-to-face discussion regarding the above mentioned findings and recommendations, addressing the patient/family members questions and concerns, and documenting this visit. The patient verbalizes agreement and understanding of the plan of care at this time, and is encouraged to call the [facility] office if future questions or concerns arise.     West Pugh, FNP      [facility]  [address]  [place], [address]  [phone]    This dictation was produced using voice recognition software. Although efforts have been made to minimize transcription errors, homonyms and other transcription errors may be present and may not truly reflect my intent.

## 2022-01-19 NOTE — Progress Notes (Signed)
[facility] Infusion Nursing Note:    Brandy [name] is a 67 y.o. female    Problem:  Chemotherapy (Docetaxel + Trastuzumab D1C1)      Assessment/Actions:  There were no vitals filed for this visit.    Pre-Treatment Head to Toe Assessment  Orientation Level: Oriented X4  Learning Barriers: None  Emotional Status: Calm, Responsive to Care  LOC: Alert  Neuro Symptoms: Fatigue  Mobility: Ambulatory  Moves all Extremities: Well  Is patient on O2?: No  O2 Device: None (Room air)  Generalized Edema: None  Cardiac Regularity and Symptoms:  (Denies CP, irregular HR, SOB)  Telemetry/Cardiac Monitor: No  Skin Color: Appropriate for ethnicity  Skin Condition/Temp: Warm, Dry  GI/Abdomen:  (Denies concerns)  Appetite: Good  Genitourinary Symptoms:  (Denies concerns)  Pain Score: 0-No pain    Continuing/Changes:  Dx: Breast cancer  Pt arrived with the primary c/o fatigue. Pt reports feeling pretty good otherwise.    Pt denies n/v/d, fever, chills, SOB, coughing, rash, difficulty eating and drinking CP, etc.   Pt here for Trastuzumab + Docetaxel chemotherapy/immunotherapy on C1D1.   Pt pleasant and cooperative with care, able to make needs known, A&Ox4. Pt arrived with the ability get around Independently.    Treatment plan and labs reviewed and double checked. Okay to treat.     Pt right port accessed with sterile technique, brisk blood return visualized.   Pt completed treatment without issues. Pt chest port flushed, hep locked and de-accessed. Pt discharged in stable condition.     Evaluation:  Pt discharged in stable condition. Pt given next appt. Ambulated to lobby with steady gait.    [name]

## 2022-01-19 NOTE — Progress Notes (Signed)
Refill of Dexamethasone as per protocol on her tx. Refill Dexamethasone 4 mg tablet Take 2 tablets by mouth daily with breakfast. Take 8 mg by mouth once daily for three days starting day 2 of each cycle. Dispense: 24. Refill: 0. Sent electronically to [facility].    Pt notified tHuntsman CorporationI sent the refill in to [facility].

## 2022-01-20 NOTE — Progress Notes (Signed)
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 HER2-positive carcinoma of breast.    Treatment plan:  Docetaxel D1/ Trastuzumab D1,8,15 C5-8, followed by Trastuzumab D1 C 9-22, 21 day cycles    Visit Info:  Chemo Teach    Participants:  Patient    Learning and Barriers:  None    Instruction Methods:  Verbal and Written    Chemotherapy Side Effects Covered:  Docetaxel- decreased WBC, decreased platelet, decreased hemoglobin with is part of the red blood cells, hair loss, fatigue, fluid retention or swelling (edema), mouth irritation or sores, rash or itchy skin, muscle or joint pain or weakness, nausea or vomiting, diarrhea, nail changes, numbness or tingling in hands and feet.    Trastuzumab- Diarrhea, fatigue, headache, muscle or joint pian or weakness, nausea or vomiting.    Venous Access Assessment:  Port    Patient Handout Materials:  Patient Education Material Source: [email or web address]    Prescriptions:  Pt already has Ondansetron and Prochlorperazine prescriptions.  Pt requested refill on Dexamethasone. Rx sent to [facility]    Patient verbalizes understanding of material covered.  All questions answered.  Consent signed    Notes:  Teach done in infusion room on 01/20/22  I talked with pt to let her know I sent in refill for Dexamethasone. She denies any issues at that time  Pt to get echo or muga every 3 months to check the heart function

## 2022-01-26 ENCOUNTER — Ambulatory Visit

## 2022-02-01 ENCOUNTER — Other Ambulatory Visit: Admit: 2022-02-01

## 2022-02-01 DIAGNOSIS — R590 Localized enlarged lymph nodes: Secondary | ICD-10-CM

## 2022-02-01 LAB — CELLAVISION DIFFERENTIAL
Basophils %: 0 % (ref 0–2)
Basophils, Absolute: 0 10*3/ÂµL (ref 0.0–0.1)
Eosinophils %: 0 % (ref 0–5)
Eosinophils, Absolute: 0 10*3/ÂµL (ref 0.0–0.4)
Lymphocytes %: 18 % — ABNORMAL LOW (ref 20–40)
Lymphocytes, Absolute: 1.1 10*3/ÂµL (ref 1.0–4.0)
Metamyelocytes %: 1 %
Metamyelocytes, Absolute: 0.1 10*3/ÂµL — ABNORMAL HIGH
Monocytes %: 7 % (ref 2–12)
Monocytes, Absolute: 0.4 10*3/ÂµL (ref 0.2–1.0)
Myelocytes %: 1 %
Myelocytes, Absolute: 0.1 10*3/ÂµL — ABNORMAL HIGH
Neutrophils %: 73 % (ref 50–74)
Neutrophils, Absolute: 4.5 10*3/ÂµL (ref 2.0–7.4)
Platelet Estimate: ADEQUATE
Total Cells Counted: 100
WBC: 6.1 10*3/ÂµL (ref 4.5–11.0)
nRBC: 1 /100

## 2022-02-01 LAB — CBC WITH AUTO DIFFERENTIAL
HCT: 22.2 % — ABNORMAL LOW (ref 35.0–48.0)
Hemoglobin: 7.2 g/dL — ABNORMAL LOW (ref 11.7–16.5)
MCH: 31.8 pg (ref 28.3–33.3)
MCHC: 32.6 g/dL (ref 32.5–36.0)
MCV: 97.5 fL (ref 81.0–100.0)
MPV: 7.2 fL (ref 6.9–10.0)
Platelet Count: 332 10*3/ÂµL (ref 150–405)
RBC: 2.27 10*6/ÂµL — ABNORMAL LOW (ref 3.80–5.60)
RDW: 21.1 % — ABNORMAL HIGH (ref 11.7–16.1)
WBC: 6.1 10*3/ÂµL (ref 4.5–11.0)

## 2022-02-01 LAB — COMPREHENSIVE METABOLIC PANEL
ALT - Alanine Aminotransferase: 17 IU/L (ref 5–33)
AST - Aspartate Aminotransferase: 27 IU/L (ref 5–32)
Albumin/Globulin Ratio: 1.1 — ABNORMAL LOW (ref 1.2–2.2)
Albumin: 3.7 g/dL (ref 3.5–5.0)
Alkaline Phosphatase: 67 IU/L (ref 35–105)
Anion Gap: 17.4 mmol/L (ref 9.0–18.0)
BUN / Creatinine Ratio: 27.3 — ABNORMAL HIGH (ref 12.0–20.0)
BUN: 18 mg/dL (ref 8–20)
Bilirubin Total: 0.5 mg/dL (ref 0.10–1.70)
CO2 - Carbon Dioxide: 21 mmol/L (ref 17–27)
Calcium: 9.6 mg/dL (ref 8.6–10.6)
Chloride: 104.6 mmol/L (ref 101.0–111.0)
Creatinine: 0.66 mg/dL (ref 0.60–1.30)
Glomerular Filtration Rate Estimate (Female): >90 mL/min/{1.73_m2} (ref >=60)
Glucose: 93 mg/dL (ref 74–106)
Osmolality Calculation: 286.6 mOsm/kg (ref 275.0–300.0)
Potassium: 3.77 mmol/L (ref 3.50–5.10)
Protein Total: 7.1 g/dL (ref 6.4–8.2)
Sodium: 143 mmol/L (ref 135–145)

## 2022-02-02 ENCOUNTER — Other Ambulatory Visit: Admit: 2022-02-02

## 2022-02-02 ENCOUNTER — Ambulatory Visit: Admit: 2022-02-02 | Attending: RN

## 2022-02-02 DIAGNOSIS — R590 Localized enlarged lymph nodes: Secondary | ICD-10-CM

## 2022-02-02 DIAGNOSIS — C50919 Malignant neoplasm of unspecified site of unspecified female breast: Secondary | ICD-10-CM

## 2022-02-02 LAB — IRON AND TIBC
Iron Saturation: 16 % — ABNORMAL LOW (ref 20–55)
Iron: 48 ug/dL (ref 28–170)
Total Iron Binding Capacity (Transferrin Saturation): 299 ug/dL (ref 261–487)
Transferrin: 209 mg/dL (ref 192–382)

## 2022-02-02 LAB — ABO/RH (HCLL): Rh (D): POSITIVE

## 2022-02-02 LAB — ANTIBODY SCREEN (HCLL): Antibody Screen: NEGATIVE

## 2022-02-02 LAB — FERRITIN: Ferritin: 1022 ng/mL — ABNORMAL HIGH (ref 13–150)

## 2022-02-02 MED ORDER — trastuzumab-dkst (OGIVRI) 231 mg in sodium chloride 0.9 % (NS) 250 mL infusion
420 | Freq: Once | INTRAVENOUS | Status: AC
Start: 2022-02-02 — End: 2022-02-02
  Administered 2022-02-02 (×2): via INTRAVENOUS

## 2022-02-02 NOTE — Progress Notes (Signed)
HBG 7.2 on 02/01/22. Two units of PRBC ordered per Dr. Nadeen Landau.   Radene Gunning, RN

## 2022-02-02 NOTE — Progress Notes (Signed)
[facility] Infusion Nursing Note:    Brandy [name] is a 66 y.o. female    Problem:  Infusion IV (Trastuzumab)      Assessment/Actions:  Vitals:    02/02/22 1334   BP: 127/68   Pulse: 94   Temp: 36.1 ?C (97 ?F)   SpO2: 94%       Pre-Treatment Head to Toe Assessment  Orientation Level: Oriented X4  Learning Barriers: None  Emotional Status: Calm  LOC: Alert  Neuro Symptoms: Fatigue, Drowsiness  Difficulty Sleeping: Yes  Mobility: Ambulatory  Moves all Extremities: Limited movement (weakness, fatigue)  Bilateral Breath Sounds: Other (Comment) (denies sob)  Cough: None  Is patient on O2?: No  O2 Device: None (Room air)  Generalized Edema: None  Cardiac Regularity and Symptoms: Other (Comment) (Deneis CP)  Telemetry/Cardiac Monitor: No  Skin Color: Appropriate for ethnicity, Other (Comment) (some pale patchiness with darker patchiness of skin. A few scabs from bug bits on ankle area.)  Skin Condition/Temp: Warm, Dry  GI/Abdomen:  (denies concerns)  Appetite: Fair  Genitourinary Symptoms: Other (Comment) (slight buring with urination since yesterday. Pt thinks it is from not drinking enough water yesterday.)  Pain Score: 0-No pain (none at this time)  Pain Edu?: Yes    Continuing/Changes:  Dx: Her2-positive carcinoma of breast   Pt arrived with the primary c/o fatigue.   Pt denies n/v/d, fever, chills, SOB, CP, coughing, rash, difficulty eating and drinking.   Pt here for Trastuzumab immunotherapy on C1 D8.   Abnormal lab values: hbg 7.2, Dr. Nadeen Landau notified and 2 units of PRBC ordered. Pt notified of this. Additional labs and type and screen also ordered. Pt to get labs drawn today after treatment.   Pt pleasant and cooperative with care, able to make needs known, A&Ox4. Pt arrived with the ability to get around Independent.    Treatment plan reviewed and double checked. Okay to treat.       Evaluation:  Pt rt chest port accessed with sterile technique, brisk blood return visualized and flushed per protocol.    Pt completed  treatment without issues. Pt chest port flushed, +BR observed, NS locked and de-accessed. Pt discharged in stable condition. Pt advised to call with any questions or concerns.     Sinda Du, RN

## 2022-02-03 ENCOUNTER — Ambulatory Visit: Admit: 2022-02-03 | Discharge: 2022-02-03

## 2022-02-03 DIAGNOSIS — D500 Iron deficiency anemia secondary to blood loss (chronic): Secondary | ICD-10-CM

## 2022-02-03 MED ORDER — acetaminophen (TYLENOL) tablet 650 mg
325 | Freq: Once | ORAL | Status: AC
Start: 2022-02-03 — End: 2022-02-03
  Administered 2022-02-03: 18:00:00 325 mg via ORAL

## 2022-02-03 MED ORDER — diphenhydrAMINE (BENADRYL) injection 25 mg
50 | Freq: Once | INTRAMUSCULAR | Status: AC
Start: 2022-02-03 — End: 2022-02-03
  Administered 2022-02-03: 18:00:00 50 mg via INTRAVENOUS

## 2022-02-03 MED ORDER — furosemide (LASIX) injection 20 mg
10 | Freq: Once | INTRAMUSCULAR | Status: AC
Start: 2022-02-03 — End: 2022-02-03
  Administered 2022-02-03: 21:00:00 10 mg via INTRAVENOUS

## 2022-02-03 NOTE — Progress Notes (Signed)
[facility] Progress Note    Patient Name: Brandy [name]      DOB: 04-Sep-1955 Chief Complaint   Patient presents with   . Blood     2 units of PRBC's          Vital Signs: LMP  (LMP Unknown)   Genitourinary:   continent    Cognitive:   AAOx3, Appropriate and Speech clear/understandable  Musculoskeletal:  ambulates independently w/ min. assistance and reports fatigue with low blood counts.     Cardiovascular:   no chest pain  Integumentary:   skin warm & dry and skin color consistent w/ race    Pulmonary:   resp reg, unlabored, WNL for age  Skin Wound Assessment:   N/A          Gastrointestinal:   No GI distress  Pain:  None         Nurse's Notes:   2 units of PRBCs and premedications given PO and via port-a-cath per orders.       Discharge Instructions:  Yes   Reviewed Plan of Care F/U with Patient: Yes   Patient Next Appointment Date: Visit date not found

## 2022-02-08 ENCOUNTER — Other Ambulatory Visit: Admit: 2022-02-08

## 2022-02-08 DIAGNOSIS — R590 Localized enlarged lymph nodes: Secondary | ICD-10-CM

## 2022-02-08 LAB — COMPREHENSIVE METABOLIC PANEL
ALT - Alanine Aminotransferase: 23 IU/L (ref 5–33)
AST - Aspartate Aminotransferase: 26 IU/L (ref 5–32)
Albumin/Globulin Ratio: 1.2 (ref 1.2–2.2)
Albumin: 3.9 g/dL (ref 3.5–5.0)
Alkaline Phosphatase: 85 IU/L (ref 35–105)
Anion Gap: 16 mmol/L (ref 9.0–18.0)
BUN / Creatinine Ratio: 34.9 — ABNORMAL HIGH (ref 12.0–20.0)
BUN: 22 mg/dL — ABNORMAL HIGH (ref 8–20)
Bilirubin Total: 0.6 mg/dL (ref 0.10–1.70)
CO2 - Carbon Dioxide: 21 mmol/L (ref 17–27)
Calcium: 9.9 mg/dL (ref 8.6–10.6)
Chloride: 107 mmol/L (ref 101.0–111.0)
Creatinine: 0.63 mg/dL (ref 0.60–1.30)
Glomerular Filtration Rate Estimate (Female): >90 mL/min/{1.73_m2} (ref >=60)
Glucose: 117 mg/dL — ABNORMAL HIGH (ref 74–106)
Osmolality Calculation: 291.2 mOsm/kg (ref 275.0–300.0)
Potassium: 3.99 mmol/L (ref 3.50–5.10)
Protein Total: 7.2 g/dL (ref 6.4–8.2)
Sodium: 144 mmol/L (ref 135–145)

## 2022-02-08 LAB — CBC WITH AUTO DIFFERENTIAL
Basophils %: 1 % (ref 0–2)
Basophils, Absolute: 0 10*3/ÂµL (ref 0.0–0.1)
Eosinophils %: 0 % (ref 0–5)
Eosinophils, Absolute: 0 10*3/ÂµL (ref 0.0–0.4)
HCT: 33.1 % — ABNORMAL LOW (ref 35.0–48.0)
Hemoglobin: 11.1 g/dL — ABNORMAL LOW (ref 11.7–16.5)
Lymphocytes %: 17 % — ABNORMAL LOW (ref 20–40)
Lymphocytes, Absolute: 1 10*3/ÂµL (ref 1.0–4.0)
MCH: 31.6 pg (ref 28.3–33.3)
MCHC: 33.4 g/dL (ref 32.5–36.0)
MCV: 94.5 fL (ref 81.0–100.0)
MPV: 6.9 fL (ref 6.9–10.0)
Monocytes %: 7 % (ref 2–12)
Monocytes, Absolute: 0.4 10*3/ÂµL (ref 0.2–1.0)
Neutrophils %: 76 % — ABNORMAL HIGH (ref 50–74)
Neutrophils, Absolute: 4.4 10*3/ÂµL (ref 2.0–7.4)
Platelet Count: 406 10*3/ÂµL — ABNORMAL HIGH (ref 150–405)
RBC: 3.51 10*6/ÂµL — ABNORMAL LOW (ref 3.80–5.60)
RDW: 20.5 % — ABNORMAL HIGH (ref 11.7–16.1)
WBC: 5.9 10*3/ÂµL (ref 4.5–11.0)

## 2022-02-09 ENCOUNTER — Ambulatory Visit

## 2022-02-09 ENCOUNTER — Ambulatory Visit: Admit: 2022-02-09 | Discharge: 2022-02-09 | Attending: RN

## 2022-02-09 ENCOUNTER — Ambulatory Visit: Admit: 2022-02-09 | Discharge: 2022-02-09 | Attending: Medical Oncology

## 2022-02-09 DIAGNOSIS — Z170 Estrogen receptor positive status [ER+]: Secondary | ICD-10-CM

## 2022-02-09 DIAGNOSIS — D649 Anemia, unspecified: Secondary | ICD-10-CM

## 2022-02-09 DIAGNOSIS — R590 Localized enlarged lymph nodes: Secondary | ICD-10-CM

## 2022-02-09 MED ORDER — ferrous sulfate 134 mg (27 mg iron) Tab
134 | Freq: Every day | ORAL | 0 refills | Status: DC
Start: 2022-02-09 — End: 2022-02-09

## 2022-02-09 MED ORDER — ferrous gluconate (FERGON) 324 mg (38 mg iron) tablet
324 | ORAL_TABLET | Freq: Every day | ORAL | 11 refills | 90.00000 days | Status: DC
Start: 2022-02-09 — End: 2022-03-31

## 2022-02-09 MED ORDER — trastuzumab-dkst (OGIVRI) 231 mg in sodium chloride 0.9 % (NS) 250 mL infusion
420 | Freq: Once | INTRAVENOUS | Status: AC
Start: 2022-02-09 — End: 2022-02-09
  Administered 2022-02-09 (×2): via INTRAVENOUS

## 2022-02-09 NOTE — Progress Notes (Signed)
[facility] Infusion Nursing Note:    Brandy [name] is35 a 65 y.o. female    Problem:  Infusion IV (Trastuzumah with no premeds)      Assessment/Actions:  There were no vitals filed for this visit.    Pre-Treatment Head to Toe Assessment  Orientation Level: Oriented X4  Emotional Status: Calm  LOC: Alert  Neuro Symptoms: Fatigue  Difficulty Sleeping: No  Mobility: Ambulatory  Moves all Extremities: Well  Generalized Edema: None  Skin Color: Appropriate for ethnicity  Skin Condition/Temp: Warm, Dry  Appetite: Good  Pain Score: 0-No pain    Continuing/Changes:  Patient wanted to go ahead and get her treatment today and then change her schedule/treatment timing for the future.     Evaluation:  Patient says she is fatigued, but no other issues.     Tolerated treatment without complication. Administered Trastuzumab via port access. Flushed with 20mL of NS. No heparin. Blood return noted.     No further questions or concerns prior to discharge.    Joneen Roach, RN

## 2022-02-09 NOTE — Patient Instructions (Signed)
Instructions and Staff Orders :   Proceed with Herceptin today as planned     Plan start of Taxotere + Herceptin Q 3 weeks staring 10-14 days from today    Lab studies : cbc, cmp, TSH     Imaging : No    Referral:NO    Medications/Rx : Pending Prescriptions:                       Disp   Refills    ferrous sulfate 134 mg (27 mg iron) Tab   30 each0            Sig: Take 27 mg by mouth once daily.    Follow up : Reschedule next treatment day to 2 weeks   2 weeks with cycle #2 Taxotere + Herceptin

## 2022-02-09 NOTE — Progress Notes (Signed)
Patient reports having deep dull achy pain in the left areola that happens at speratic times, appetite has been less as food tastes differently. No other symptoms or concerns

## 2022-02-09 NOTE — Progress Notes (Signed)
Brandy [name] is a 66 y.o. female, DOB 02-Feb-1956, who presents today for Follow-up (Left breast cancer )  .    HPI :   Patient self-referred herself for a screening mammogram in February 2023: BI-RADS 0 incomplete:  This reported unusual mass in the left mid breast outer lower quadrant measuring 11 mm.  Associated with some calcifications and architectural distortion.  Adjacent possible satellite lesion measuring 7 mm.  Diagnostic ultrasound recommended with biopsy as needed.  ?  07/29/2021: Ultrasound breast bilateral:  Left breast: Irregular 10 mm spiculated mass with irregular margins and posterior shadowing in the left breast at the 3 o'clock position.  Enlarged lymph nodes with thickened cortex in the left axilla measuring 4 cm.  BI-RADS 5-highly suggestive of malignancy.  Irregular mass in the left lateral breast with enlarged lymph node in the left axilla.  ?  08/10/2021: Ultrasound-guided breast biopsy left:  No suspicious clusters of microcalcifications.  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  ER 99%, PR 95% HER2/neu 3+, Ki-67 70%, p53 70%  ?  09/20/2021:  Left axillary lymph node needle core biopsy: Metastatic carcinoma similar to that in the left breast biopsy performed 4 weeks ago with similar positive ER 25% PR 2% and positive 3+ HER2 profile; Ki-67 40%, p53 35%.  ?  09/20/2021 Endometrial curettage:Benign inactive endometrium suggestive of benign endometrial polyp.  ?  09/27/2021: Presents for discussion of neoadjuvant chemotherapy.    Interval History:   02/09/2022:  The patient presents to the office today for her scheduled follow up. She is accompanied by her husband. She reports experiencing deep, dull, aching pain in her left breast/areola that happens intermittently.     She reports a decrease in appetite as food tastes differently  to her. She denies any fluctuations in energy levels on days she does not receive treatment. Overall  Reports profound fatigue and in ability to complete daily chores.    She denies night sweats or chills. She denies black/bloody stools. She reports weight loss since her first day of treatment. She notes she was at 268 lbs and is now at 236 lbs.   She occasionally experiences episodes of light headedness near syncope especially when she has needed blood transfusion and uses her walker/cane to ambulate due to her unsteady gait.     She denies taking oral iron at home but remains consistent with vitamin D3 and calcium supplements.     12/29/2021:   The patient presents to the office today for a follow up on her most recent lab work and to discuss her current treatment plan. She is accompanied by her husband. She states that her energy levels have improved. She experiences occasional weakness, nausea, and mosquito bites. She has tried many different bug sprays to prevent mosquito bites, however, none have worked well for her. Her appetite remains unchanged but she has been maintaining her weight. She denies mouth sores.     She states that she ambulates with a cane when she is walking long distances.            Review of Systems   Constitutional: Positive for appetite change (declined but improved post transfusion), fatigue and unexpected weight change (Has lost 0ver 30 lbs since diagnosis and treatment). Negative for chills and diaphoresis.   HENT: Negative.    Eyes: Negative.    Respiratory: Positive  for shortness of breath (on undue exertion). Negative for chest tightness.    Cardiovascular: Negative.    Gastrointestinal: Positive for constipation and nausea. Negative for vomiting.   Endocrine: Negative.    Genitourinary: Negative.    Musculoskeletal: Negative.    Allergic/Immunologic: Negative.    Neurological: Positive for light-headedness.   Hematological: Negative.    Psychiatric/Behavioral: Negative.            Vitals:    02/09/22 0940   BP: 135/79   Pulse: 88   Temp: 36.1 ?C (97 ?F)   SpO2: 97%    Body mass index is 41.82 kg/m?Marland Kitchen  Physical Exam  Well developed , obese, female in no acute distress, looks her stated age  HENT: Normocephalic, Atraumatic, PERLA, EOMI  Sclera anicteric, Conjunctiva pink  Oral mucosa: moist, no exudates, no tonsillar enlargement, no pharyngeal erythema  Neck :Supple, No JVD,no palpable lymphadenopathy, thyroid not palpable  Chest : Clear to auscultation  Extremities: No edema, erythema, tenderness or deformities.    Lab Results   Component Value Date    WHITEBLOODCE 5.9 02/08/2022    HGB 11.1 (L) 02/08/2022    HCT 33.1 (L) 02/08/2022    MCV 94.5 02/08/2022    LABPLAT 406 (H) 02/08/2022     Lab Results   Component Value Date    NA 144 02/08/2022    K 3.99 02/08/2022    CL 107.0 02/08/2022    CO2 21 02/08/2022    BUN 22 (H) 02/08/2022    CREATININES 0.63 02/08/2022    BCR 34.9 (H) 02/08/2022    GLU 117 (H) 02/08/2022    CALCIUM 9.9 02/08/2022    AST 26 02/08/2022    ALT 23 02/08/2022    ALKPHOS 85 02/08/2022    PROT 7.2 02/08/2022    ALBUMIN 3.9 02/08/2022    BILITOT 0.60 02/08/2022    ANIONGAP 16.0 02/08/2022    LABGLOM 79 02/28/2021    GFRADDINFO  02/08/2022      Comment:      For additional information, please visit the National Kidney Disease Education Program (NKDEP) at [email or web address].ee    OSMOLALITY 291.2 02/08/2022    AGRATIO 1.2 02/08/2022     Lab Results   Component Value Date    FERRITIN 1,022 (H) 02/02/2022     Iron Saturation   Date Value Ref Range Status   02/02/2022 16 (L) 20 - 55 % Final     Lab Results   Component Value Date    IRON 48 02/02/2022    TIBC 299 02/02/2022    FERRITIN 1,022 (H) 02/02/2022           IMAGING:  ECHO 2D complete w dopplet wo contrast on 01/09/2022:   IMPRESSION  1. Left ventricular cavity size is normal. Left ventricular wall thickness is mildly increased.  2. Global systolic function: There is normal global left ventricular contractility. Left  ventricular EF 55 - 60 %.  3. Regional systolic function: Wall motion: No regional wall motion abnormalities were noted.  4. Diastolic function: The diastolic filling pattern is indeterminate.  5. The right ventricle appears to be in the upper limits of normal.  6. The sinuses of Valsalva appears dilated, measuring 4.1 cm. The ascending aorta appears dilated, measuring 3.9 cm.  7. 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 It is well seated and does not rock. Transvalvular gradients are within expected limits for the valve.  8. The mitral valve has mild regurgitation.  9. The tricuspid  valve has trace/physiological regurgitation.  10. The RVSP is estimated at 39 mmHg, which is mildly elevated.  11. There is no pericardial effusion.    X-ray ribs right 2 view on 12/23/2021:   IMPRESSION:  1. No displaced fractures seen.    CT angiogram chest on 12/08/2021:   IMPRESSION:  -Ascending aorta mildly obscured by motion and streak artifact; no evidence of dissection within this limitation.  No aneurysm.  -Nonspecific patchy areas of ground-glass noted in both lungs are likely due to atelectasis given expiratory phase, although superimposed infection, edema, and or inflammatory process is not excluded.  Recommend clinical correlation.  -Trace left pleural effusion.  -The tip of a right-sided chest port is in the right atrium.  -Multiple additional chronic/incidental findings as detailed above.    ASSESSMENT & PLAN:   Encounter Diagnoses   Name SNOMED CT(R) Primary?   . 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 Anemia, unspecified type ANEMIA        Having completed 4 cycles of Adriamycin and Cytoxan therapy , planned to receive docetaxel and Herceptin every 3 weeks up to the point of proceeding for definitive surgery after which given the high likelihood of having axillary nodal involvement,  consider combination of trastuzumab and pertuzumab for a total of 1 year.    Patient has a history of ischemic cardiomyopathy however most recent echocardiogram in March 2023 reported normal left ventricular ejection fraction of 60 to 65%.  Repeat echocardiogram end of July 2023 , LVEF at 55-60 %, prior to start of Herceptin therapy.    Change Herceptin to Q 3 week dosing along with Docetaxel Q 3 weeks.    Suggest chemo holiday for 10 days, given decline in PFS and profound fatigue.    Iron deficiency: Status post parenteral iron therapy.  With excellent early response.  We will continue to monitor closely.  Patient has had a colonoscopy in November 2022 with findings of precancerous rectal polyp and recommendations for follow-up colonoscopy within 12 months.  Most recently stool for occult blood in June 2023 was reported negative.    I advised the patient to repeat a stool test due to decrease in iron levels as indicated by the patient's most recent lab studies and need for transfusions. I encouraged the patient to resume use of oral iron at home.     She remains consistent with dexamethasone and has been tolerating it well. She tolerates Herceptin well. We plan to proceed with treatment as scheduled and discontinue for 10 days for recovery.       Patient Instructions:   Instructions and Staff Orders :   Proceed with Herceptin today as planned   ?  Plan start of Taxotere + Herceptin Q 3 weeks staring 10-14 days from today  ?  Lab studies : cbc, cmp, TSH   ?  Imaging : No  ?  Referral:NO  ?  Medications/Rx : Pending Prescriptions:                       Disp   Refills    ferrous sulfate 134 mg (27 mg iron) Tab   30 each0            Sig: Take 27 mg by mouth once daily.  ?  Follow up : Reschedule next treatment day to 2 weeks   2 weeks with cycle #  2 Taxotere + Herceptin     All questions were answered to patient's satisfaction.  I have spent a total of 30 minutes on this patient's care today.  This time includes  face-to-face time with the patient as well as time spent reviewing patient records, coordinating/communicating with care teams and documenting the patient's visit.    Reiana Poteet, MD [facility] Attestation:    Sign Off: By adding my name below, I, Jarvis Morgan, Scribe, attest that this documentation has been prepared under the direction and in the presence of Dr. Ailana Cuadrado.   AtNadeen Landauending: I, Dr. Nadeen Landau, personally performed the services described in this documentation. All medical record entries made by the scribe were at my direction and in my presence. I have reviewed the chart and discharge instructions (if applicable) and agree that the record reflects my personal performance and is accurate.

## 2022-02-09 NOTE — Progress Notes (Signed)
Orders per chart notes placed

## 2022-02-10 DEATH — deceased

## 2022-02-16 ENCOUNTER — Ambulatory Visit

## 2022-02-22 ENCOUNTER — Other Ambulatory Visit: Admit: 2022-02-22

## 2022-02-22 DIAGNOSIS — E213 Hyperparathyroidism, unspecified: Secondary | ICD-10-CM

## 2022-02-22 DIAGNOSIS — C50612 Malignant neoplasm of axillary tail of left female breast: Secondary | ICD-10-CM

## 2022-02-22 LAB — CBC WITH AUTO DIFFERENTIAL
Basophils %: 1 % (ref 0–2)
Basophils, Absolute: 0 10*3/ÂµL (ref 0.0–0.1)
Eosinophils %: 3 % (ref 0–5)
Eosinophils, Absolute: 0.2 10*3/ÂµL (ref 0.0–0.4)
HCT: 31.4 % — ABNORMAL LOW (ref 35.0–48.0)
Hemoglobin: 10.3 g/dL — ABNORMAL LOW (ref 11.7–16.5)
Lymphocytes %: 15 % — ABNORMAL LOW (ref 20–40)
Lymphocytes, Absolute: 0.9 10*3/ÂµL — ABNORMAL LOW (ref 1.0–4.0)
MCH: 31.5 pg (ref 28.3–33.3)
MCHC: 32.9 g/dL (ref 32.5–36.0)
MCV: 95.8 fL (ref 81.0–100.0)
MPV: 7.1 fL (ref 6.9–10.0)
Monocytes %: 6 % (ref 2–12)
Monocytes, Absolute: 0.3 10*3/ÂµL (ref 0.2–1.0)
Neutrophils %: 75 % — ABNORMAL HIGH (ref 50–74)
Neutrophils, Absolute: 4.2 10*3/ÂµL (ref 2.0–7.4)
Platelet Count: 157 10*3/ÂµL (ref 150–405)
RBC: 3.28 10*6/ÂµL — ABNORMAL LOW (ref 3.80–5.60)
RDW: 20.3 % — ABNORMAL HIGH (ref 11.7–16.1)
WBC: 5.6 10*3/ÂµL (ref 4.5–11.0)

## 2022-02-22 LAB — COMPREHENSIVE METABOLIC PANEL
ALT - Alanine Aminotransferase: 22 IU/L (ref 5–33)
AST - Aspartate Aminotransferase: 25 IU/L (ref 5–32)
Albumin/Globulin Ratio: 1.3 (ref 1.2–2.2)
Albumin: 4 g/dL (ref 3.5–5.0)
Alkaline Phosphatase: 92 IU/L (ref 35–105)
Anion Gap: 11.7 mmol/L (ref 9.0–18.0)
BUN / Creatinine Ratio: 26.8 — ABNORMAL HIGH (ref 12.0–20.0)
BUN: 19 mg/dL (ref 8–20)
Bilirubin Total: 0.6 mg/dL (ref 0.10–1.70)
CO2 - Carbon Dioxide: 24 mmol/L (ref 17–27)
Calcium: 9.8 mg/dL (ref 8.6–10.6)
Chloride: 106.3 mmol/L (ref 101.0–111.0)
Creatinine: 0.71 mg/dL (ref 0.60–1.30)
Glomerular Filtration Rate Estimate (Female): >90 mL/min/{1.73_m2} (ref >=60)
Glucose: 97 mg/dL (ref 74–106)
Osmolality Calculation: 285.3 mOsm/kg (ref 275.0–300.0)
Potassium: 4.12 mmol/L (ref 3.50–5.10)
Protein Total: 7.2 g/dL (ref 6.4–8.2)
Sodium: 142 mmol/L (ref 135–145)

## 2022-02-22 LAB — TSH: TSH - Thyroid Stimulating Hormone: 3.24 ÂµIU/mL (ref 0.34–5.60)

## 2022-02-23 ENCOUNTER — Ambulatory Visit: Admit: 2022-02-23 | Discharge: 2022-02-23 | Attending: FNP

## 2022-02-23 ENCOUNTER — Ambulatory Visit

## 2022-02-23 ENCOUNTER — Ambulatory Visit: Admit: 2022-02-23 | Discharge: 2022-02-23 | Attending: RN

## 2022-02-23 DIAGNOSIS — Z170 Estrogen receptor positive status [ER+]: Secondary | ICD-10-CM

## 2022-02-23 DIAGNOSIS — R590 Localized enlarged lymph nodes: Secondary | ICD-10-CM

## 2022-02-23 DIAGNOSIS — Z5111 Encounter for antineoplastic chemotherapy: Secondary | ICD-10-CM

## 2022-02-23 MED ORDER — ondansetron (ZOFRAN) injection 8 mg
4 | Freq: Once | INTRAMUSCULAR | Status: AC
Start: 2022-02-23 — End: 2022-02-23
  Administered 2022-02-23: 18:00:00 4 mg via INTRAVENOUS

## 2022-02-23 MED ORDER — dexAMETHasone (DECADRON) injection 10 mg
10 | Freq: Once | INTRAMUSCULAR | Status: AC
Start: 2022-02-23 — End: 2022-02-23
  Administered 2022-02-23: 18:00:00 10 mg via INTRAVENOUS

## 2022-02-23 MED ORDER — trastuzumab-dkst (OGIVRI) 672 mg in sodium chloride 0.9 % (NS) 250 mL infusion
420 | Freq: Once | INTRAVENOUS | Status: AC
Start: 2022-02-23 — End: 2022-02-23
  Administered 2022-02-23 (×2): via INTRAVENOUS

## 2022-02-23 MED ORDER — DOCEtaxel (TAXOTERE) 212 mg in sodium chloride 0.9 % (NS) 500 mL chemo infusion
80 | Freq: Once | INTRAVENOUS | Status: AC
Start: 2022-02-23 — End: 2022-02-23
  Administered 2022-02-23 (×2): via INTRAVENOUS

## 2022-02-23 NOTE — Progress Notes (Signed)
NAME:  Lundynn Hollingworth  MRN:  [id number]  DOB:  1956/02/12  PROGRESS NOTE      REASON FOR VISIT:    Chief Complaint   Patient presents with   . Follow-up     2wk f/u breast cancer      HISTORY OF PRESENT ILLNESS  This patient is a 66 y.o. female who presents for follow-up. The patient's care has been transferred to me. Pertinent hematology/oncology history has been obtained in part via review of medical records, confirmed with patient at time of visit.     HEMATOLOGY / ONCOLOGY HISTORY  Per Dr. Jacquelin Hawking progress note dated 10/20/2021:?  ?  Patient self-referred herself for a screening mammogram in February 2023: BI-RADS 0 incomplete:This reported unusual mass in the left mid breast outer lower quadrant measuring 11 mm. ?Associated with some calcifications and architectural distortion. ?Adjacent possible satellite lesion measuring 7 mm.?Diagnostic ultrasound recommended with biopsy as needed.  ?  07/29/2021: Ultrasound breast bilateral?-?BI-RADS 5-highly suggestive of malignancy.?Left breast: Irregular 10 mm spiculated mass with irregular margins and posterior shadowing in the left breast at the 3 o'clock position.?Enlarged lymph nodes with thickened cortex in the left axilla measuring 4 cm.?rregular mass in the left lateral breast with enlarged lymph node in the left axilla.  ?  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 ?ER 99%, PR 95% HER2/neu 3+, Ki-67 70%, p53 70%  ?  09/20/2021:?Left axillary lymph node needle core biopsy: Metastatic carcinoma similar to that in the left breast biopsy performed 4 weeks ago with similar positive ER 25% PR 2% and positive 3+ HER2 profile; Ki-67 40%, p53 35%.  ?  09/20/2021: Endometrial curettage:Benign inactive endometrium suggestive of benign endometrial polyp.  ?  10/20/2021: Cycle #1 AC +  neulasta onpro  ?  10/21/2021: Letrozole discontinued  ?  11/10/2021: Cycle #2 AC + neulasta onpro  12/08/2021: Cycle #3 AC + neulasta onpro  12/29/2021: Cycle #4 AC + neulasta onpro  ?  01/09/2022: ECHOCARDIOGRAM  1. Left ventricular cavity size is normal. Left ventricular wall thickness is mildly increased.  2. Global systolic function: There is normal global left ventricular contractility. Left ventricular EF 55 - 60 %.  3. Regional systolic function: Wall motion: No regional wall motion abnormalities were noted.  4. Diastolic function: The diastolic filling pattern is indeterminate.  5. The right ventricle appears to be in the upper limits of normal.  6. The sinuses of Valsalva appears dilated, measuring 4.1 cm. The ascending aorta appears dilated, measuring 3.9 cm.  7. 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 It is well seated and does not rock. Transvalvular gradients are within expected limits for the valve.  8. The mitral valve has mild regurgitation.  9. The tricuspid valve has trace/physiological regurgitation.  10. The RVSP is estimated at 39 mmHg, which is mildly elevated.  11. There is no pericardial effusion.  ?  01/19/2022: patient presents for follow-up and for cycle #1 Docetaxel (21 day cycle)  + Trastuzumab (weekly x12). She reports feeling better today and has been working on regaining strength in her lower extremities. Appetite is good. No nausea/vomiting. Occasional constipation, which she manages with over-the-counter stool softeners. Patient scheduled to start Docetaxel and Trastuzumab today. Reviewed medication side effects and dosing schedule with the patient. Discussed proper use of prn antiemetics, as well as scheduled dexamethasone dosing surrounding treatment days. Reviewed  current lab and echo results with the patient.     02/23/2022 INTERVAL HISTORY: patient presents for 2 week follow-up and for cycle #2 Docetaxel/Trastuzumab. She is accompanied by her spouse throughout the  visit. She is smiling and in good spirits today. She reports that she feels much better overall. Energy level has improved and appetite has improved. She has taste changes, so certain foods don't appeal to her anymore. However, she is maintaining her weight and eating a balanced diet. Denies nausea, vomiting, diarrhea, or constipation. Denies shortness of breath, cough, or chest pain. Denies numbness or tingling in the extremities. She reports that she is planning a trip to see her son and grandchildren in [place] in the next week or two, for her grandson's 7th bday. Advised and recommended that the patient consider postponing her road trip to [place] until after she is finished with cytotoxic chemotherapy treatment. She should finish with Taxotere by the end of October. Discussed being immunocompromised and that her nadir would likely land around the dates of her trip. She would be around many small, school aged children and at high risk for infection. Advised patient that if she does go on her trip, to be cautious and to follow infection prevention guidelines.     PAST MEDICAL HISTORY:  Past Medical History:   Diagnosis Date   . Arrhythmia    . CHF (congestive heart failure) (HCC)    . Colon cancer (HCC) 12/28/2016   . Colonic mass 11/28/2016    POSTOPERATIVE DIAGNOSIS 11/28/16 colonoscopy:  1) Colonoscopy with 3/4 circumference apple core lesion at full insertion of the colonoscope but likely distal to the Hepatic flexure 2) Hemorrhoids: mild internal 3) Mass: hot forceps biopsied times two without complications and tattooed times two proximal to the mass  Biopsy suspicious for intramucosal adenocarcinoma. Referral to Dr Oran Rein done and she   . Encounter for screening colonoscopy 12/05/2017   . History of blood transfusion    . HIT (heparin-induced thrombocytopenia) (HCC)    . Hyperlipidemia    . 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  Screening for colon cancer 10/04/2016    POSTOPERATIVE DIAGNOSIS 11/28/16 colonoscopy:  1) Colonoscopy with 3/4 circumference apple core lesion at full insertion of the colonoscope but likely distal to the Hepatic flexure 2) Hemorrhoids: mild internal 3) Mass: hot forceps biopsied times two without complications and tattooed times two proximal to the mass  Biopsy suspicious for intramucosal adenocarcinoma. Referral to Dr Oran Rein done and she   . Thyroid disease     Hyperparathyroid     PAST SURGICAL HISTORY:  Past Surgical History:   Procedure Laterality Date   . CARDIAC SURGERY  01/23/2016    AVR   . COLON SURGERY  2018    Hemicolectomy   . COLONOSCOPY N/A 05/04/2021    Procedure: COLONOSCOPY; W/ POSSIBLE BIOPSY & POLYPECTOMY;  Surgeon: Truitt Merle, MD;  Location: [facility] OR;  Service: [facility] Procedures;  Laterality: N/A;   . CORONARY ANGIOPLASTY     . DILATION AND CURETTAGE OF UTERUS N/A 09/16/2021    Procedure: DILATATION AND CURETTAGE; HYSTEROSCOPY with myosure;  Surgeon: Conni Elliot, MD;  Location: [facility] OR;  Service: [facility] Procedures;  Laterality: N/A;   . PROCEDURE N/A 01/27/2016    Procedure: AORTIC VALVE REPLACEMENT; INTRA AORTIC BALLOON INSERTION;  Surgeon: Otho Bellows, MD;  Location: [facility] OR;  Service: Cardiology;  Laterality: N/A;   . PROCEDURE Right 03/10/2016  Procedure: AMPUTATION TOE;  Surgeon: Chrystie Nose, DPM;  Location: [facility] OR;  Service: Podiatry;  Laterality: Right;   . PROCEDURE N/A 11/28/2016    Procedure: COLONOSCOPY; BIOPSY;  Surgeon: Gailen Shelter, MD;  Location: [facility] OR;  Service: [facility] Procedures;  Laterality: N/A;  ink spot marking apple core mass proximal lesion distal to hepatic flexure   . PROCEDURE Right 12/28/2016    Procedure: LAPAROPSCOPIC CONVERTED TO OPEN HAND ASSISTED RIGHT HEMI COLECTOMY;  Surgeon: Truitt Merle, MD;  Location: [facility] OR;  Service: [facility] Procedures;  Laterality: Right;  Dr. Oran Rein wants 3 hours   . PROCEDURE N/A 01/15/2018    Procedure: COLONOSCOPY; W/ POSSIBLE BIOPSY &  POLYPECTOMY;  Surgeon: Truitt Merle, MD;  Location: [facility] OR;  Service: [facility] Procedures;  Laterality: N/A;   . PROCEDURE N/A 03/19/2018    Procedure: PARATHYROIDECTOMY; RECURRENT LARYNGEAL NERVE MONITORING, INTRAOPERATIVE PARATHYROID HORMONE;  Surgeon: Edmund Hilda, MD;  Location: [facility] OR;  Service: Ear Nose and Throat;  Laterality: N/A;   . PROCEDURE Left 10/11/2021    Procedure: RIGHT SUBCLAVIAN PORT PLACEMENT;  Surgeon: Truitt Merle, MD;  Location: [facility] OR;  Service: [facility] Procedures;  Laterality: Left;   . TONSILLECTOMY     . TUBAL LIGATION  1980   . US GUIDED LYMPH NODE BIOPSY  09/15/2021    US GUIDED LYMPH NODE BIOPSY 09/15/2021 Jeanice Lim, MD [facility] [place] [facility]     CURRENT MEDICATIONS AND ALLERGIES:    Current Outpatient Medications:   .  aspirin 81 MG EC tablet, Take 81 mg by mouth every morning., Disp: , Rfl:   .  atorvastatin (LIPITOR) 20 mg tablet, Take 1 tablet by mouth every night at bedtime. (Patient taking differently: Take 20 mg by mouth every night at bedtime.), Disp: 90 tablet, Rfl: 3  .  calcium carbonate/vitamin D3 (CALCIUM 600 + D,3, ORAL), Take 1 tablet by mouth every morning., Disp: , Rfl:   .  carvediloL (COREG) 6.25 mg tablet, Take 1 tablet by mouth 2 times daily with meals., Disp: 180 tablet, Rfl: 1  .  cholecalciferol, vitamin D3, (VITAMIN D3) 50 mcg (2,000 unit) Tab tablet, Take 2,000 units by mouth every morning., Disp: , Rfl:   .  dexAMETHasone (DECADRON) 4 mg tablet, Take 2 tablets by mouth daily with breakfast. Take 8mg  by mouth once daily for three days starting day 2 of each cycle., Disp: 24 tablet, Rfl: 0  .  EPINEPHrine (EPIPEN) 0.3 mg/0.3 mL AtIn injection, Inject 0.3 mL into the muscle once for 1 dose., Disp: 0.3 mL, Rfl: 0  .  ferrous gluconate (FERGON) 324 mg (38 mg iron) tablet, Take 1 tablet by mouth daily with breakfast., Disp: 30 tablet, Rfl: 11  .  HYDROcodone-acetaminophen (NORCO) 5-325 mg per tablet, Take 1 tablet by mouth every 6 hours as needed for Pain., Disp: 60 tablet, Rfl: 0  .   levoFLOXacin (LEVAQUIN) 250 mg tablet, Take 1 tablet by mouth once daily. (Patient not taking: Reported on 02/23/2022), Disp: 5 tablet, Rfl: 0  .  ondansetron (ZOFRAN-ODT) 8 mg disintegrating tablet, Take 1 tablet by mouth every 8 hours as needed for Nausea., Disp: 30 tablet, Rfl: 3  .  prochlorperazine (COMPAZINE) 10 mg tablet, Take 1 tablet by mouth every 6 hours as needed. For break through nausea/vomiting., Disp: 30 tablet, Rfl: 3  Allergies   Allergen Reactions   . Bee Sting [Allergen Ext-Venom-Honey Bee] Anaphylaxis     Patient reports an epi-pen    . Heparin Anaphylaxis  Possible HIT   . Sulfa (Sulfonamide Antibiotics) Rash     In ICU for 3 days post dose       SOCIAL AND FAMILY HISTORY:  Social History     Tobacco Use   . Smoking status: Never     Passive exposure: Yes   . Smokeless tobacco: Never   Vaping Use   . Vaping Use: Never used   Substance Use Topics   . Alcohol use: No   . Drug use: No     Family History   Problem Relation Age of Onset   . Colon cancer Mother         dx at older age   . Heart disease Father    . Heart disease Sister    . Cancer Brother    . Heart disease Maternal Aunt    . Ovarian cancer Maternal Aunt    . Cancer Maternal Uncle    . Heart disease Maternal Uncle    . Breast cancer Paternal Aunt      REVIEW OF SYSTEMS:   Review of Systems   Constitutional: Negative for chills and fever.        Energy level has improved; less fatigue   HENT:   Negative for mouth sores.    Eyes: Negative for eye problems.   Respiratory: Negative for cough and shortness of breath.    Cardiovascular: Negative for chest pain, leg swelling and palpitations.   Gastrointestinal: Negative.  Negative for nausea and vomiting.   Genitourinary: Negative.     Neurological: Negative.    Psychiatric/Behavioral: Negative.        PHYSICAL EXAM:  Vitals:    02/23/22 0928   BP: 130/64   BP Location: Right arm   Pulse: 87   Temp: 36 ?C (96.8 ?F)   SpO2: 96%   Weight: 238 lb 4.8 oz (108.1 kg)   Height: 5' 3 (1.6 m)       Physical Exam  Vitals and nursing note reviewed.   Constitutional:       General: She is not in acute distress.     Appearance: Normal appearance. She is overweight.   HENT:      Head: Normocephalic and atraumatic.      Mouth/Throat:      Pharynx: Oropharynx is clear.   Eyes:      General: No scleral icterus.     Conjunctiva/sclera: Conjunctivae normal.   Cardiovascular:      Rate and Rhythm: Normal rate.      Pulses: Normal pulses.   Pulmonary:      Effort: Pulmonary effort is normal. No respiratory distress.   Abdominal:      General: There is no distension.   Musculoskeletal:         General: Normal range of motion.      Cervical back: Normal range of motion.   Skin:     General: Skin is warm and dry.   Neurological:      General: No focal deficit present.      Mental Status: She is alert and oriented to person, place, and time.   Psychiatric:         Mood and Affect: Mood normal.         Behavior: Behavior normal.     LABS:  Lab Walk-In on 02/22/2022   Component Date Value   . TSH - Thyroid Stimulatin* 02/22/2022 3.24    . Sodium 02/22/2022 142    . Potassium 02/22/2022  4.12    . Chloride 02/22/2022 106.3    . CO2 - Carbon Dioxide 02/22/2022 24    . BUN 02/22/2022 19    . Creatinine 02/22/2022 0.71    . Glucose 02/22/2022 97    . Calcium 02/22/2022 9.8    . AST - Aspartate Aminotra* 02/22/2022 25    . ALT - Alanine Aminotrans* 02/22/2022 22    . Alkaline Phosphatase 02/22/2022 92    . Protein Total 02/22/2022 7.2    . Albumin 02/22/2022 4.0    . Bilirubin Total 02/22/2022 0.60    . Anion Gap 02/22/2022 11.7    . Albumin/Globulin Ratio 02/22/2022 1.3    . Glomerular Filtration Ra* 02/22/2022 >90    . GFR Additional Info 02/22/2022     . BUN / Creatinine Ratio 02/22/2022 26.8 (H)    . Osmolality Calculation 02/22/2022 285.3    . WBC 02/22/2022 5.6    . RBC 02/22/2022 3.28 (L)    . Hemoglobin 02/22/2022 10.3 (L)    . HCT 02/22/2022 31.4 (L)    . MCV 02/22/2022 95.8    . [facility] 02/22/2022 31.5    . MCHC 02/22/2022  32.9    . RDW 02/22/2022 20.3 (H)    . Platelet Count 02/22/2022 157    . MPV 02/22/2022 7.1    . Neutrophils % 02/22/2022 75 (H)    . Lymphocytes % 02/22/2022 15 (L)    . Monocytes % 02/22/2022 6    . Eosinophils % 02/22/2022 3    . Basophils % 02/22/2022 1    . Neutrophils, Absolute 02/22/2022 4.2    . Lymphocytes, Absolute 02/22/2022 0.9 (L)    . Monocytes, Absolute 02/22/2022 0.3    . Eosinophils, Absolute 02/22/2022 0.2    . Basophils, Absolute 02/22/2022 0.0       IMAGING:  No results found for this or any previous visit from the past 14 days.    ASSESSMENT / MEDICAL DECISION MAKING  Noor Branigan is a 66 y.o. female who is seen today for follow-up. All current laboratory and/or imaging results reviewed with the patient. 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 Patient underwent 4 cycles of A/C per NCCN guidelines. Course of treatment interrupted due to cytopenias and immunosuppression. Presenting today for cycle #2 Docetaxel + Trastuzumab. Plan for Docetaxel/Trastuzumab every 3 weeks x4 cycles, followed by Trastuzumab every 3 weeks.    ECOG SCORE: 1    PLAN    1. Treatment: proceed with cycle #2 Docetaxel/Trastuzumab today as scheduled  2. Labs: CBC, CMP prior to f/u  3. Imaging: ECHO due next month  4. Additional Recommendations:   o Maintain a well-balanced diet  o Engage in daily physical activity as tolerated   5. Follow-up: return for visit with MD or NP in 3 weeks, or sooner if warranted.     Please call the CTC if any further questions or concerns arise  I spent 30 minutes with the patient, including physical examination, face-to-face discussion regarding  the above mentioned findings and recommendations, addressing the patient/family members questions and concerns, and documenting this visit. The patient verbalizes agreement and understanding of the plan of care at this time, and  is encouraged to call the CTC office if future questions or concerns arise.   West Pughndrea, FNP    [facility]  [address]  [place], [address]  [phone]    This dictation was produced using voice recognition software. Although efforts have been made to minimize transcription errors, homonyms and other transcription errors may be present and may not truly reflect my intent.

## 2022-02-23 NOTE — Progress Notes (Signed)
[facility] Infusion Nursing Note:    Brandy [name] is89 a 65 y.o. female    Problem:  Infusion IV (Docetaxel and Trastuzumab/Premeds: Dex and zofran)      Assessment/Actions:  Vitals:    02/23/22 0958   BP: 139/80   Pulse: 94   Temp: 36.2 ?C (97.2 ?F)   SpO2: 92%       Pre-Treatment Head to Toe Assessment  Orientation Level: Oriented X4  Emotional Status: Calm  LOC: Alert  Neuro Symptoms: None  Difficulty Sleeping: No  Mobility: Ambulatory  Moves all Extremities: Well  Generalized Edema: Non-pitting  Skin Color: Appropriate for ethnicity  Skin Condition/Temp: Warm, Dry  Appetite: Good  Pain Score: 0-No pain    Continuing/Changes:  Administered medications via port access. Flushed with NS only. Patient allergic to heparin.     Evaluation:  Patient tolerated well. No adverse effects.       Tolerated treatment without complication.     No further questions or concerns prior to discharge.    Joneen Roach, RN

## 2022-02-23 NOTE — Progress Notes (Signed)
Pt is still tired but is feeling much better since her 2 week break and starting the iron tabs.

## 2022-02-27 MED ORDER — EPINEPHrine (EPIPEN) 0.3 mg/0.3 mL AtIn auto-injector
0.3 | Freq: Once | INTRAMUSCULAR | 2 refills | 22.50000 days | Status: DC | PRN
Start: 2022-02-27 — End: 2022-03-31

## 2022-02-27 NOTE — Telephone Encounter (Signed)
Patient is requesting a refill of Epipen, last seen by [facility] on  11/23/2021, and [facility]  Routed pended order to Bretta Bang, DO for review.     Future Appointments   Date Time Provider Department Center   03/16/2022  9:00 AM West Pugh, FNP [facility]   03/16/2022  9:30 AM [facility] INFUSION CHAIR 6 [facility]        BP Readings from Last 3 Encounters:   02/23/22 139/80   02/23/22 130/64   02/09/22 135/79     Pulse Readings from Last 3 Encounters:   02/23/22 94   02/23/22 87   02/09/22 88

## 2022-02-27 NOTE — Telephone Encounter (Signed)
Pt called in to request a refill of: EPINEPHrine (EPIPEN) 0.3 mg/0.3 mL AtIn injection  Pharmacy: [facility] number: [phone]

## 2022-03-02 ENCOUNTER — Ambulatory Visit

## 2022-03-07 ENCOUNTER — Observation Stay: Admit: 2022-03-07

## 2022-03-07 ENCOUNTER — Inpatient Hospital Stay: Admit: 2022-03-08

## 2022-03-07 ENCOUNTER — Emergency Department: Admit: 2022-03-07

## 2022-03-07 ENCOUNTER — Inpatient Hospital Stay
Admission: EM | Admit: 2022-03-07 | Discharge: 2022-03-22 | Disposition: A | Discharge status: Hospice | Payer: MEDICARE | Admitting: MD

## 2022-03-07 ENCOUNTER — Emergency Department: Admit: 2022-03-07 | Discharge: 2022-03-23

## 2022-03-07 ENCOUNTER — Emergency Department

## 2022-03-07 DIAGNOSIS — A419 Sepsis, unspecified organism: Secondary | ICD-10-CM

## 2022-03-07 DIAGNOSIS — R6521 Severe sepsis with septic shock: Secondary | ICD-10-CM

## 2022-03-07 DIAGNOSIS — T826XXA Infection and inflammatory reaction due to cardiac valve prosthesis, initial encounter: Principal | ICD-10-CM

## 2022-03-07 LAB — COMPREHENSIVE METABOLIC PANEL
ALT - Alanine Aminotransferase: 74 IU/L — ABNORMAL HIGH (ref 5–33)
AST - Aspartate Aminotransferase: 237 IU/L — ABNORMAL HIGH (ref 5–32)
Albumin/Globulin Ratio: 0.9 — ABNORMAL LOW (ref 1.2–2.2)
Albumin: 3.4 g/dL — ABNORMAL LOW (ref 3.5–5.0)
Alkaline Phosphatase: 61 IU/L (ref 35–105)
Anion Gap: 19.2 mmol/L — ABNORMAL HIGH (ref 9.0–18.0)
BUN / Creatinine Ratio: 15.4 (ref 12.0–20.0)
BUN: 65 mg/dL — ABNORMAL HIGH (ref 8–20)
Bilirubin Total: 0.9 mg/dL (ref 0.10–1.70)
CO2 - Carbon Dioxide: 17 mmol/L (ref 17–27)
Calcium: 8.1 mg/dL — ABNORMAL LOW (ref 8.6–10.6)
Chloride: 100.8 mmol/L — ABNORMAL LOW (ref 101.0–111.0)
Creatinine: 4.21 mg/dL — ABNORMAL HIGH (ref 0.60–1.30)
Glomerular Filtration Rate Estimate (Female): 11 mL/min/{1.73_m2} — ABNORMAL LOW (ref >=60)
Glucose: 142 mg/dL — ABNORMAL HIGH (ref 74–106)
Osmolality Calculation: 294.9 mOsm/kg (ref 275.0–300.0)
Potassium: 3.74 mmol/L (ref 3.50–5.10)
Protein Total: 7 g/dL (ref 6.4–8.2)
Sodium: 137 mmol/L (ref 135–145)

## 2022-03-07 LAB — URINALYSIS WITH MICROSCOPIC, CULTURE IF INDICATED
Ascorbic Acid, Urine: NEGATIVE
Bilirubin, Urine: NEGATIVE
Glucose, Urine: NEGATIVE mg/dL
Granular Casts, Urine: >50 /LPF — AB
Ketones, Urine: NEGATIVE mg/dL
Nitrite, Urine: NEGATIVE
Protein, Urine: >=500 mg/dL — AB
RBC, Urine: 67 /HPF — AB (ref 0–4)
Renal/Transitional Epithelial, Urine: 0 /HPF (ref 0–2)
Specific Gravity, Urine: 1.011 (ref 1.005–1.030)
Squamous Epithelial, UA: 0 /HPF (ref 0–4)
Urobilinogen, Urine: <2 mg/dL (ref <2.0)
WBC, Urine: >100 /HPF — AB (ref 0–4)
pH, UA: 5 (ref 5.0–7.5)

## 2022-03-07 LAB — ARTERIAL BLOOD GAS CG4
Base Excess: -10 mmol/L — ABNORMAL LOW (ref ?–4)
Base Excess: -9 mmol/L — ABNORMAL LOW (ref ?–4)
Base Excess: -9 mmol/L — ABNORMAL LOW (ref ?–4)
HCO3 Arterial: 13.2 mmol/L — ABNORMAL LOW (ref 22.0–26.0)
HCO3 Arterial: 13.8 mmol/L — ABNORMAL LOW (ref 22.0–26.0)
HCO3 Arterial: 14.5 mmol/L — ABNORMAL LOW (ref 22.0–26.0)
Lactate (Blood Gas): 1.5 mmol/L (ref 0.5–1.9)
Lactate (Blood Gas): 1.6 mmol/L (ref 0.5–1.9)
Lactate (Blood Gas): 1.9 mmol/L (ref 0.5–1.9)
O2 Liter Flow: 15
O2 Liter Flow: 15
O2 Liter Flow: 3
Spontaneous Respiratory Rate: 28
Spontaneous Respiratory Rate: 38
Spontaneous Respiratory Rate: 44
pCO2 Arterial: 16.9 mmHg — ABNORMAL LOW (ref 34.0–46.0)
pCO2 Arterial: 18 mmHg — ABNORMAL LOW (ref 34.0–46.0)
pCO2 Arterial: 19.7 mmHg — ABNORMAL LOW (ref 34.0–46.0)
pH Arterial: 7.473 — ABNORMAL HIGH (ref 7.350–7.450)
pH Arterial: 7.474 — ABNORMAL HIGH (ref 7.350–7.450)
pH Arterial: 7.521 — ABNORMAL HIGH (ref 7.350–7.450)
pO2 Arterial: 103 mmHg — ABNORMAL HIGH (ref 75.0–100.0)
pO2 Arterial: 60 mmHg — ABNORMAL LOW (ref 75.0–100.0)
pO2 Arterial: 74 mmHg — ABNORMAL LOW (ref 75.0–100.0)
sO2 Arterial: 93 % (ref >=90)
sO2 Arterial: 97 % (ref >=90)
sO2 Arterial: 99 % (ref >=90)

## 2022-03-07 LAB — URINE CULTURE (SLM): Culture Result: >100000 — AB

## 2022-03-07 LAB — PHOSPHORUS: Phosphorus: 4.1 mg/dL (ref 2.5–4.7)

## 2022-03-07 LAB — LIPASE: Lipase: 532 IU/L — ABNORMAL HIGH (ref 22–51)

## 2022-03-07 LAB — RESPIRATORY PATHOGEN PANEL BY PCR (W/COVID-19 AND B. PARAPERTUSSIS)
Adenovirus: NOT DETECTED
Bordetella parapertussis: NOT DETECTED
Bordetella pertussis: NOT DETECTED
Chlamydophila pneumoniae: NOT DETECTED
Coronavirus 229E: NOT DETECTED
Coronavirus HKU1: NOT DETECTED
Coronavirus NL63: NOT DETECTED
Coronavirus OC43: NOT DETECTED
Human Metapneumovirus: NOT DETECTED
Human Rhinovirus/Enterovirus: NOT DETECTED
Influenza A: NOT DETECTED
Influenza B: NOT DETECTED
Mycoplasma pneumoniae: NOT DETECTED
Parainfluenza Virus 1: NOT DETECTED
Parainfluenza Virus 2: NOT DETECTED
Parainfluenza Virus 3: NOT DETECTED
Parainfluenza Virus 4: NOT DETECTED
Respiratory Syncytial Virus: NOT DETECTED
SARS-CoV-2 (COVID-19) by RT-PCR: NOT DETECTED

## 2022-03-07 LAB — CELLAVISION DIFFERENTIAL
Bands %: 2 % (ref 0–2)
Bands, Absolute: 0.1 10*3/ÂµL
Basophils %: 0 % (ref 0–2)
Basophils, Absolute: 0 10*3/ÂµL (ref 0.0–0.1)
Eosinophils %: 0 % (ref 0–5)
Eosinophils, Absolute: 0 10*3/ÂµL (ref 0.0–0.4)
Lymphocytes %: 6 % — ABNORMAL LOW (ref 20–40)
Lymphocytes, Absolute: 0.3 10*3/ÂµL — ABNORMAL LOW (ref 1.0–4.0)
Monocytes %: 1 % — ABNORMAL LOW (ref 2–12)
Monocytes, Absolute: 0 10*3/ÂµL — ABNORMAL LOW (ref 0.2–1.0)
Neutrophils %: 91 % — ABNORMAL HIGH (ref 50–74)
Neutrophils, Absolute: 4.5 10*3/ÂµL (ref 2.0–7.4)
Platelet Estimate: DECREASED — AB
Total Cells Counted: 100
WBC: 4.9 10*3/ÂµL (ref 4.5–11.0)

## 2022-03-07 LAB — BLOOD CULTURE

## 2022-03-07 LAB — PROTIME-INR
INR: 1.3 — ABNORMAL HIGH (ref 0.9–1.1)
Protime: 14.4 Seconds — ABNORMAL HIGH (ref 10.0–13.2)

## 2022-03-07 LAB — MAGNESIUM: Magnesium: 1.6 mg/dL (ref 1.3–2.5)

## 2022-03-07 LAB — BLOOD CULTURE MRSA BY PCR
MRSA PCR Result: NOT DETECTED
Staph aureus PCR Result: DETECTED — AB

## 2022-03-07 LAB — CK: CK (Creatine Kinase): 2664 IU/L — ABNORMAL HIGH (ref 26–192)

## 2022-03-07 LAB — CBC WITH AUTO DIFFERENTIAL
HCT: 27 % — ABNORMAL LOW (ref 35.0–48.0)
Hemoglobin: 9.1 g/dL — ABNORMAL LOW (ref 11.7–16.5)
MCH: 31 pg (ref 28.3–33.3)
MCHC: 33.6 g/dL (ref 32.5–36.0)
MCV: 92.2 fL (ref 81.0–100.0)
MPV: 8.8 fL (ref 6.9–10.0)
Platelet Count: 68 10*3/ÂµL — ABNORMAL LOW (ref 150–405)
RBC: 2.93 10*6/ÂµL — ABNORMAL LOW (ref 3.80–5.60)
RDW: 19.5 % — ABNORMAL HIGH (ref 11.7–16.1)
WBC: 4.9 10*3/ÂµL (ref 4.5–11.0)

## 2022-03-07 LAB — TSH: TSH - Thyroid Stimulating Hormone: 2.31 ÂµIU/mL (ref 0.34–5.60)

## 2022-03-07 MED ORDER — polyethylene glycol (MIRALAX) packet 17 g
17 | Freq: Two times a day (BID) | ORAL | Status: DC | PRN
Start: 2022-03-07 — End: 2022-03-22

## 2022-03-07 MED ORDER — acetaminophen (TYLENOL) suppository 650 mg
650 | Freq: Four times a day (QID) | RECTAL | Status: DC
Start: 2022-03-07 — End: 2022-03-08
  Administered 2022-03-07 – 2022-03-08 (×2): 650 mg via RECTAL

## 2022-03-07 MED ORDER — vancomycin (VANCOCIN) 1,500 mg in sodium chloride 0.9 % (NS) 250 mL IVPB
1000 | INTRAVENOUS | Status: DC
Start: 2022-03-07 — End: 2022-03-10

## 2022-03-07 MED ORDER — lactated Ringer's bolus 1,000 mL
Freq: Once | INTRAVENOUS | Status: AC
Start: 2022-03-07 — End: 2022-03-07
  Administered 2022-03-07 – 2022-03-08 (×2): via INTRAVENOUS

## 2022-03-07 MED ORDER — acetaminophen (TYLENOL) suppository 650 mg
650 | Freq: Once | RECTAL | Status: AC
Start: 2022-03-07 — End: 2022-03-07
  Administered 2022-03-07: 18:00:00 650 mg via RECTAL

## 2022-03-07 MED ORDER — lidocaine (LIDODERM) 5 % patch 1 patch
5 | Freq: Every day | TOPICAL | Status: DC | PRN
Start: 2022-03-07 — End: 2022-03-22
  Administered 2022-03-18 – 2022-03-19 (×2): 5 via TRANSDERMAL

## 2022-03-07 MED ORDER — lactated Ringer's bolus 1,000 mL
Freq: Once | INTRAVENOUS | Status: AC
Start: 2022-03-07 — End: 2022-03-07
  Administered 2022-03-07 (×2): via INTRAVENOUS

## 2022-03-07 MED ORDER — acetaminophen (TYLENOL) suppository 650 mg
650 | Freq: Four times a day (QID) | RECTAL | Status: DC | PRN
Start: 2022-03-07 — End: 2022-03-07

## 2022-03-07 MED ORDER — hydrocortisone sod succ (PF) (Solu-CORTEF) injection 100 mg
100 | Freq: Two times a day (BID) | INTRAMUSCULAR | Status: DC
Start: 2022-03-07 — End: 2022-03-08
  Administered 2022-03-07 – 2022-03-08 (×2): 100 mg via INTRAVENOUS

## 2022-03-07 MED ORDER — acetaminophen (TYLENOL) tablet 650 mg
325 | ORAL | Status: DC | PRN
Start: 2022-03-07 — End: 2022-03-09

## 2022-03-07 MED ORDER — norepinephrine (LEVOPHED) 4 mg in NaCl 0.9 % 250 mL (16 mcg/mL) premix infusion
4 | INTRAVENOUS | Status: DC | PRN
Start: 2022-03-07 — End: 2022-03-12
  Administered 2022-03-07 – 2022-03-08 (×12): 425016 ug/min via INTRAVENOUS
  Administered 2022-03-08: 11:00:00 4 ug/min via INTRAVENOUS
  Administered 2022-03-08 (×3): 425016 ug/min via INTRAVENOUS
  Administered 2022-03-08: 05:00:00 4 ug/min via INTRAVENOUS
  Administered 2022-03-08: 10:00:00 425016 ug/min via INTRAVENOUS
  Administered 2022-03-08: 01:00:00 4 ug/min via INTRAVENOUS
  Administered 2022-03-08 – 2022-03-12 (×4): 425016 ug/min via INTRAVENOUS

## 2022-03-07 MED ORDER — ondansetron (ZOFRAN) injection 4 mg
4 | Freq: Four times a day (QID) | INTRAMUSCULAR | Status: DC | PRN
Start: 2022-03-07 — End: 2022-03-22

## 2022-03-07 MED ORDER — LORazepam (ATIVAN) injection 0.5 mg
2 | Freq: Once | INTRAMUSCULAR | Status: DC | PRN
Start: 2022-03-07 — End: 2022-03-18

## 2022-03-07 MED ORDER — ipratropium-albuteroL (DUO-NEB) 0.5 mg-3 mg(2.5 mg base)/3 mL nebulizer solution 3 mL
0.5 | Freq: Once | RESPIRATORY_TRACT | Status: DC | PRN
Start: 2022-03-07 — End: 2022-03-07

## 2022-03-07 MED ORDER — cefepime (MAXIPIME) 1 g in D5W IVPB Premix
1 | Freq: Two times a day (BID) | INTRAVENOUS | Status: DC
Start: 2022-03-07 — End: 2022-03-09
  Administered 2022-03-08: 13:00:00 1 g via INTRAVENOUS
  Administered 2022-03-08 (×2): 150 g via INTRAVENOUS
  Administered 2022-03-08 – 2022-03-09 (×2): 1 g via INTRAVENOUS
  Administered 2022-03-09 (×2): 150 g via INTRAVENOUS
  Administered 2022-03-09: 14:00:00 1 g via INTRAVENOUS

## 2022-03-07 MED ORDER — naloxone (NARCAN) injection 0.04 mg
0.4 | INTRAMUSCULAR | Status: DC | PRN
Start: 2022-03-07 — End: 2022-03-22

## 2022-03-07 MED ORDER — calcium carbonate (TUMS) chewable tablet 500-1,000 mg
500 | Freq: Three times a day (TID) | ORAL | Status: DC | PRN
Start: 2022-03-07 — End: 2022-03-22

## 2022-03-07 MED ORDER — magnesium hydroxide (MILK OF MAGNESIA) 400 mg/5 mL oral suspension 30 mL
400 | Freq: Every day | ORAL | Status: DC | PRN
Start: 2022-03-07 — End: 2022-03-22

## 2022-03-07 MED ORDER — lactated Ringer's infusion
INTRAVENOUS | Status: DC
Start: 2022-03-07 — End: 2022-03-10
  Administered 2022-03-07 – 2022-03-10 (×17): via INTRAVENOUS

## 2022-03-07 MED ORDER — norepinephrine bitartrate in NS (LEVOPHED) 4 mg/250 mL (16 mcg/mL) premix infusion
4 | INTRAVENOUS | Status: AC
Start: 2022-03-07 — End: 2022-03-07
  Administered 2022-03-07: 19:00:00 4 mg/250 mL (16 mcg/mL)

## 2022-03-07 MED ORDER — cefepime (MAXIPIME) 2 g in D5W IVPB Premix
2 | Freq: Three times a day (TID) | INTRAVENOUS | Status: DC
Start: 2022-03-07 — End: 2022-03-07

## 2022-03-07 MED ORDER — lactated Ringer's bolus 1,000 mL
Freq: Once | INTRAVENOUS | Status: AC
Start: 2022-03-07 — End: 2022-03-07
  Administered 2022-03-07 – 2022-03-08 (×2): via INTRAVENOUS

## 2022-03-07 MED ORDER — hydrocortisone sod succ (PF) (Solu-CORTEF) injection 100 mg
100 | Freq: Once | INTRAMUSCULAR | Status: DC
Start: 2022-03-07 — End: 2022-03-07

## 2022-03-07 MED ORDER — vasopressin (VASOSTRICT) 40 units in sodium chloride 0.9 % (NS) 250 mL (0.16 units/mL) infusion
20 | INTRAVENOUS | Status: DC
Start: 2022-03-07 — End: 2022-03-08

## 2022-03-07 MED ORDER — senna-docusate (PERICOLACE) 8.6-50 mg per tablet 1 tablet
8.6-50 | Freq: Two times a day (BID) | ORAL | Status: DC | PRN
Start: 2022-03-07 — End: 2022-03-22

## 2022-03-07 MED ORDER — cefTRIAXone (ROCEPHIN) 2 g in 50 mL SNAP IVPB
Freq: Once | INTRAMUSCULAR | Status: AC
Start: 2022-03-07 — End: 2022-03-07
  Administered 2022-03-07 (×2): via INTRAVENOUS

## 2022-03-07 MED ORDER — sulfur hexafluoride (Lumason) lipid-type A microspheres injection 2 mL
25 | Freq: Once | INTRAVENOUS | Status: DC | PRN
Start: 2022-03-07 — End: 2022-03-18

## 2022-03-07 MED ORDER — racepinephrine (VAPONEFRIN) 2.25 % nebulizer solution 0.5 mL
2.25 | Freq: Once | RESPIRATORY_TRACT | Status: DC | PRN
Start: 2022-03-07 — End: 2022-03-07

## 2022-03-07 MED ORDER — ondansetron (ZOFRAN-ODT) disintegrating tablet 4 mg
4 | Freq: Four times a day (QID) | ORAL | Status: DC | PRN
Start: 2022-03-07 — End: 2022-03-22

## 2022-03-07 MED ORDER — HYDROmorphone (DILAUDID) injection 0.2-0.5 mg
0.5 | INTRAMUSCULAR | Status: DC | PRN
Start: 2022-03-07 — End: 2022-03-18
  Administered 2022-03-07 – 2022-03-18 (×24): 0.5 mg via INTRAVENOUS

## 2022-03-07 MED ORDER — vancomycin (VANCOCIN) 1,500 mg in sodium chloride 0.9 % (NS) 250 mL IVPB
1000 | Freq: Once | INTRAVENOUS | Status: AC
Start: 2022-03-07 — End: 2022-03-07
  Administered 2022-03-07 (×2): via INTRAVENOUS

## 2022-03-07 MED ORDER — pantoprazole (PROTONIX) 40 mg in 100 mL SNAP IVPB
Freq: Every day | INTRAMUSCULAR | Status: DC
Start: 2022-03-07 — End: 2022-03-11
  Administered 2022-03-08 – 2022-03-11 (×8): via INTRAVENOUS

## 2022-03-07 MED ORDER — naloxone (NARCAN) injection 0.4 mg
0.4 | INTRAMUSCULAR | Status: DC | PRN
Start: 2022-03-07 — End: 2022-03-22

## 2022-03-07 MED ORDER — albuterol (PROVENTIL) nebulizer solution 2.5 mg
2.5 | Freq: Once | RESPIRATORY_TRACT | Status: DC | PRN
Start: 2022-03-07 — End: 2022-03-07

## 2022-03-07 NOTE — ED Notes (Addendum)
[place]  1241 Spoke to [facility], pushed images, requested neurosurgery consult  1255 Dr. Griselda Miner connected w ED MD

## 2022-03-07 NOTE — ED Notes (Signed)
Bed: 512-01  Expected date: 03/07/22  Expected time:   Means of arrival:   Comments:  2273

## 2022-03-07 NOTE — Progress Notes (Signed)
EKG completed. Handed to Dr. Leanna Sato  03/07/2022

## 2022-03-07 NOTE — Procedures (Signed)
Internal Jugular Central Line Procedure Note    Indication: septic shock, multisystem organ failure    Attending Physician: Kathalene Frames, MD    Consent:  Consent was obtained from husband prior to the procedure. Indications, risks, and benefits were explained at length.    Procedure Summary:  Time out was performed. Hands were washed immediately prior to the procedure. A surgical cap, mask with protective eyewear, full gown and sterile gloves were worn throughout the procedure.    The patient was placed in Trendelenburg position. Her right neck was prepped using chlorhexidine scrub and draped in sterile fashion using a three quarter sheet drape and sterile towels. The medial and lateral heads of the sternocleidomastoid muscle were identified as was the carotid pulse.    The Internal Jugular vein was identified using the ultrasound. Anesthesia was achieved over the vein using 1% lidocaine. Using real-time out of plane guidance, the introducer needle was inserted into the Internal Jugular vein under direct ultrasound visualization. Venous blood was withdrawn. The syringe was removed and a guidewire was advanced into the introducer needle. The guidewire was visualized in the Internal Jugular Vein by ultrasound. A small incision was made at the skin surface with a scalpel and the introducer needle was exchanged for a dilator over the guidewire. After appropriate dilation was obtained, the dilator was exchanged over the wire for a triple lumen central venous catheter.    The wire was removed and the catheter was sutured in place at 18 cm. A sterile biopatch protective disc was placed over the catheter at the insertion site. The patient tolerated the procedure without hemodynamic compromise. At time of procedure completion, all ports aspirated and flushed properly. Post-procedure chest x-ray confirmed appropriate placement prior to use. Estimated blood loss is 20 ml.     Richardean Sale, MD, PGY-1

## 2022-03-07 NOTE — ED Triage Notes (Signed)
Pt BIB EMS. Pt had fall yesterday. Pt was found in bed w/ low O2 sats and unresponsiveness. Pt receiving respiratory support via BVM by EMS.

## 2022-03-07 NOTE — ED Notes (Signed)
Physicians performing art line and central line placement. Consent signed. RN at bedside observing and assisting as needed. Will continue to monitor vitals signs.

## 2022-03-07 NOTE — Plan of Care (Signed)
Pt opens eyes when asked. Pupils equal and responsive. Does not follow any directions other than eye opening. Levo infusing at 12, MAP in low 70's. Remains tachycardic and tachypnic. On 3 lpm oxymask. No family to visit with patient since arrival to ICU.

## 2022-03-07 NOTE — Progress Notes (Signed)
PHARMACIST - VANCOMYCIN INITIAL PROGRESS NOTE     Brandy [name] is a 66 y.o. female.    Pharmacist monitoring assessment for vancomycin:    Indication: Sepsis/Septic Shock   Goal Trough: 13-20 mcg/mL  Goal AUC/MIC: 400-600 mcg*hr/mL  Concomitant Abx: Cefepime (9/26 - ; renally adjusted)    Wt: 111.1 kg (245 lb)   Ht:      BMI:43.40 kg/m?        Pertinent Labs:  SCr: 4.21 mg/dL                      eCrCl(AdjBW): 14.0 mL/min               eCrCl(LBW): 11.6 mL/min  ?  Pertinent Cultures:    9/26 - Resp PCR: Neg   Blood: In process   Urine: In process    Dosing History:    9/26 @ 1421 - Started Vancomycin 1500 mg IV (initially was load so order for x1, but dose appropriate so reordered to continue at q72h frequency)    Dosing Plan:    Starting Date/Time of Dose: 9/26 @ 1421    Estimated Dose/Schedule: Vancomycin 1500 mg IV q72h    Estimated Peak: 29.5 mcg/mL    Estimated Trough: 15.1 mcg/mL (eAUC/MIC: 517)    Dose/Frequency: Vancomycin 1500 mg IV q72h    Next Scheduled Level Date/Time: TBD - would be due 10/5 @ 1330 but will hold off on scheduling for now pending renal fxn as if improves, dose/frequency likely to change    Dosing Notes:  Initial dose of 1500 mg appropriate per above estimated values, will time out start time for order from consult x72h accordingly. Pharmacy will CTM renal function for possible regimen changes if/when improves.    Thank you for consulting pharmacy in the care of [facility]. Please call if you have any questions.     Maudry Diego, PharmD

## 2022-03-07 NOTE — Other (Signed)
*  This SWer called and spoke w/ the pt's RN, Kayla, tDorathy Daftcheck in on pt's status and to see if the husband had come to visit yet:    Dorathy Daft reports that the husband has not come to the room yet, but she did speak w/ the adult son, Richie, and updated him.    I explained the husband's plan earlier to go to their [place], [place] miles away, to feed the animals. I also updated her on the husband's deaf status and the need to speak loudly.    PLAN:  *I will continue to follow this case as needed and I may try to call the husband in the next few days to check in.    *I added notes in Summary Section of chart (physician and Tx Team) stating that the husband is deaf and needs people to speak loudly along with the fact that he feeds the animals on their ranch before dark so he may be gone around that time frame.

## 2022-03-07 NOTE — Progress Notes (Signed)
HOSPITAL/PHARMACY RENAL DOSING PROTOCOL CHANGE:  Wt: 103.4 kg (228 lb)   Ht: 160 cm      BMI:40.39 kg/m?     SCr 4.21 mg/dL; eCrCl ~ 16.1 mL/min (AdjBW), 11.6 mL/mi (LBW)    Per the Hospital/Pharmacy Renal Dosing Protocol, Cefepime 2g IV q8h was changed to Cefepime 1g IV q12h due to an estimated CrCl greater than 11 but less than 29 mL/min. Pharmacist will continue to monitor renal function and adjust dosing as necessary.    Thank You,  Maudry Diego, [facility]

## 2022-03-07 NOTE — Progress Notes (Signed)
REVIEW DETAILS  ?  Product: [id number] Adult  Subset: Infection: Sepsis  ?  ?      [X]  Select Day, One:          [X]  Episode Day 1, One:              [X]  CRITICAL, One:                  [X]  Sepsis, actual or suspected, All:                      [X]  SIRS, >= Two:                          [X]  Temperature >= 100.4?F(38.0?C) or < 97.0?F(36.1?C)                          [X]  WBC, >= One:                              [X]  > 12,000/cu.mm(12x109/L) or < 4,000/cu.mm(4x109/L)                          [X]  Heart rate > 90/min, sustained                      [X]  Organ dysfunction or severe hypoperfusion, >= One:                          [X]  Coma, stupor, obtundation, or GCS <= 9                      [X]  Intervention, >= One:                          [X]  IV fluid resuscitation <= 2d                          [X]  Hemodynamic monitoring, invasive                          [X]  Vasopressor or inotrope, continuous (excludes low or fixed dose for end stage heart failure)  ?  ?  Version: [employer]? 2023, Mar. 2023 Release

## 2022-03-07 NOTE — ED Provider Notes (Signed)
[facility] Emergency Department Encounter      Chief Complaint   Patient presents with   . Altered Mental Status       HPI:  Brandy [name] is a 66 y.o. female with a history of CHF, colon cancer, nonischemic cardiomyopathy, biosynthetic bovine aortic valve who presents to the emergency department for evaluation of altered mental status.  Brought in by ambulance from home.  Evidently last night the patient began to become sick in terms of decreased level of responsiveness and feeling unwell per EMS per husband on scene.  Patient is unable to participate with history or physical, essentially GCS 3.    History obtained from chart review and the patient.    Past Medical History:   Diagnosis Date   . Arrhythmia    . CHF (congestive heart failure) (HCC)    . Colon cancer (HCC) 12/28/2016   . Colonic mass 11/28/2016    POSTOPERATIVE DIAGNOSIS 11/28/16 colonoscopy:  1) Colonoscopy with 3/4 circumference apple core lesion at full insertion of the colonoscope but likely distal to the Hepatic flexure 2) Hemorrhoids: mild internal 3) Mass: hot forceps biopsied times two without complications and tattooed times two proximal to the mass  Biopsy suspicious for intramucosal adenocarcinoma. Referral to Dr Oran Rein done and she   . Encounter for screening colonoscopy 12/05/2017   . History of blood transfusion    . HIT (heparin-induced thrombocytopenia) (HCC)    . Hyperlipidemia    . 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 Screening for colon cancer 10/04/2016    POSTOPERATIVE DIAGNOSIS 11/28/16 colonoscopy:  1) Colonoscopy with 3/4 circumference apple core lesion at full insertion of the colonoscope but likely distal to the Hepatic flexure 2) Hemorrhoids: mild internal 3) Mass: hot forceps biopsied times two without complications and tattooed times two proximal to the mass  Biopsy suspicious for intramucosal adenocarcinoma. Referral to Dr Oran Rein done and she   . Thyroid  disease     Hyperparathyroid       Hospital Problem List as of 03/07/2022       Hospital    Septic shock [facility])    Liver lesion- small hypodense lesion around falciform lig    HER2-positive carcinoma of breast (HCC)    Overview     Left breast, diagnosed in 08/2021.  Followed by Dr. Delories Heinz at [facility]. Last seen 6/1.  Undergoing chemo, has undergone 2 cycles.          Subarachnoid hemorrhage (HCC)    * (Principal) Acute encephalopathy    Acute pyelonephritis    Acute renal failure, unspecified acute renal failure type (HCC)    SAH (subarachnoid hemorrhage) (HCC)    Encephalopathy acute   Non-Hospital Problem List as of 03/07/2022       Non-Hospital    Demand ischemia [facility])    Overview     ? 01/2016 [facility] admission found to have trop ~0.08 with chest pain. Clean cath by Dr. Crissie Figures  ? Underwent an AVR in 2017  ? Previously followed by Dr. Harlen Labs         Obesity    Iron deficiency anemia    Hilar lymphadenopathy- bilataeral     Non-ischemic cardiomyopathy (HCC)    Overview     ? 02/2016 TTE: Sinus rhythm. Moderate concentric left ventricular hypertrophy (LVH).  Normal left ventricular (LV) size.  Below normal function.  Ejection fraction (EF) 55-60%. Normal right ventricular chamber size and function.  No evidence of pulmonary arterial hypertension but the inferior vena cava (IVC) appears enlarged, suggesting a central venous pressure (CVP) of 10 mmHg. 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 No significant pericardial or pleural effusion. Compared to prior echo from February 02, 2016, left ventricular (LV) function has improved. Right ventricular (RV) chamber size and function nearly normalized.  ? 01/2016 cath: Functionally Normal Coronary Anatomy  and Aortic Valve Disease by Dr. Crissie Figures  ? Medical management: ASA, lipitor, coreg, ?ace  ? 09/08/16 ASCVD 3.3% on lipitor 20 mg  ? 02/2018 TTE: Preserved EF of 69% with mild left ventricular hypertrophy. Mild right heart chambers enlargement with normal pulmonary artery  pressures, mild to moderate left atrial enlargement. Mild dilation of ascending aorta.   ? 08/3021 ECHO: 1. Global systolic function: Left ventricular EF is 60 %. 2. 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 It is well seated and does not rock. Transvalvular gradients are within expected limits for the valve. Mean gradient 7.96 mm hg. There is no aortic regurgitation. 3. The mitral valve has mild regurgitation. 4. Compared to the prior study in 2021, EF is now 60% from 69%.         Heparin induced thrombocytopenia (HCC)    Overview     Dx in 2017.         Aortic valve disorder    Overview     ? S/p AVR 23mm MAGNA edwards  ? Complicated by HIT and subsequent necrosis of distal digits, need for intra-aortic balloon pump to get off cardiopulmonary bypass, and ARF requiring period of dialysis  ? On anticoagulation. INR goal 2-3         Colon cancer (HCC)    Overview     Mass at hepatic flexure.  Biopsies demonstrated tubular adenoma with suspicion for intramucosal adenocarcinoma.  S/p hemicolectomy with Dr. Oran Rein 12/28/2016  Following with Dr. Oran Rein with last coloscopy July 2019 grossly normal  Needs pelvic US for ovarian masses?           Hyperparathyroidism (HCC)    Overview     hypoechoic nodule in the inferior pole of the right thyroid, diagnosed 11/2017, following with Dr. Michiel Sites  Underwent parathyroidectomy in 2019, Dr. Jenelle Mages.   Lab Results   Component Value Date    TSH 3.63 03/31/2019     Lab Results   Component Value Date    PTH 46.5 07/27/2020    CALCIUM 9.8 11/09/2021    CAION 6.10 (HCrit) 11/21/2017    PHOS 3.4 07/27/2020              Back pain, thoracic    Anaphylaxis    Overview     Unknown substance, epipen prescribed as precaution.          Thickened endometrium    Malignant neoplasm of hepatic flexure (HCC)    Overview     2018 colonoscopy:  1) Colonoscopy with 3/4 circumference apple core lesion at full insertion of the colonoscope but likely distal to the Hepatic flexure 2)  Hemorrhoids: mild internal 3) Mass: hot forceps biopsied times two without complications and tattooed times two proximal to the mass  Biopsy suspicious for intramucosal adenocarcinoma.  Underwent right hemicolectomy in 2018 with Dr. Oran Rein.         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   Dr. Margot Ables.                Past Surgical History:   Procedure Laterality Date   . CARDIAC SURGERY  01/23/2016    AVR   . COLON SURGERY  2018    Hemicolectomy   . COLONOSCOPY N/A 05/04/2021    Procedure: COLONOSCOPY; W/ POSSIBLE BIOPSY & POLYPECTOMY;  Surgeon: Truitt Merle, MD;  Location: [facility] OR;  Service: [facility] Procedures;  Laterality: N/A;   . CORONARY ANGIOPLASTY     . DILATION AND CURETTAGE OF UTERUS N/A 09/16/2021    Procedure: DILATATION AND CURETTAGE; HYSTEROSCOPY with myosure;  Surgeon: Conni Elliot, MD;  Location: [facility] OR;  Service: [facility] Procedures;  Laterality: N/A;   . PROCEDURE N/A 01/27/2016    Procedure: AORTIC VALVE REPLACEMENT; INTRA AORTIC BALLOON INSERTION;  Surgeon: Otho Bellows, MD;  Location: [facility] OR;  Service: Cardiology;  Laterality: N/A;   . PROCEDURE Right 03/10/2016    Procedure: AMPUTATION TOE;  Surgeon: Chrystie Nose, DPM;  Location: [facility] OR;  Service: Podiatry;  Laterality: Right;   . PROCEDURE N/A 11/28/2016    Procedure: COLONOSCOPY; BIOPSY;  Surgeon: Gailen Shelter, MD;  Location: [facility] OR;  Service: [facility] Procedures;  Laterality: N/A;  ink spot marking apple core mass proximal lesion distal to hepatic flexure   . PROCEDURE Right 12/28/2016    Procedure: LAPAROPSCOPIC CONVERTED TO OPEN HAND ASSISTED RIGHT HEMI COLECTOMY;  Surgeon: Truitt Merle, MD;  Location: [facility] OR;  Service: [facility] Procedures;  Laterality: Right;  Dr. Oran Rein  wants 3 hours   . PROCEDURE N/A 01/15/2018    Procedure: COLONOSCOPY; W/ POSSIBLE BIOPSY & POLYPECTOMY;  Surgeon: Truitt Merle, MD;  Location: [facility] OR;  Service: [facility] Procedures;  Laterality: N/A;   . PROCEDURE N/A 03/19/2018    Procedure: PARATHYROIDECTOMY; RECURRENT LARYNGEAL NERVE MONITORING, INTRAOPERATIVE PARATHYROID HORMONE;  Surgeon: Edmund Hilda, MD;  Location: [facility] OR;  Service: Ear Nose and Throat;  Laterality: N/A;   . PROCEDURE Left 10/11/2021    Procedure: RIGHT SUBCLAVIAN PORT PLACEMENT;  Surgeon: Truitt Merle, MD;  Location: [facility] OR;  Service: [facility] Procedures;  Laterality: Left;   . TONSILLECTOMY     . TUBAL LIGATION  1980   . US GUIDED LYMPH NODE BIOPSY  09/15/2021    US GUIDED LYMPH NODE BIOPSY 09/15/2021 Jeanice Lim, MD [facility] [place] [facility]       Family History:  family history includes Breast cancer in her paternal aunt; Cancer in her brother and maternal uncle; Colon cancer in her mother; Heart disease in her father, maternal aunt, maternal uncle, and sister; Ovarian cancer in her maternal aunt.    Social History:   reports that she has never smoked. She has been exposed to tobacco smoke. She has never used smokeless tobacco. She reports that she does not drink alcohol and does not use drugs.    Allergies:  Allergies   Allergen Reactions   . Bee Sting [Allergen Ext-Venom-Honey Bee] Anaphylaxis     Patient reports an epi-pen    . Heparin Anaphylaxis     Possible HIT   . Sulfa (Sulfonamide Antibiotics) Rash     In ICU for 3 days post dose         Home Medications:  Current Discharge Medication List      CONTINUE these medications which have NOT CHANGED  Details   aspirin 81 MG EC tablet Take 81 mg by mouth every morning.      atorvastatin (LIPITOR) 20 mg tablet Take 1 tablet by mouth every night at bedtime.  Qty: 90 tablet, Refills: 3    Associated Diagnoses: Hyperlipidemia, unspecified hyperlipidemia type      calcium carbonate/vitamin D3 (CALCIUM 600 + D,3, ORAL) Take 1 tablet by mouth every morning.       carvediloL (COREG) 6.25 mg tablet Take 1 tablet by mouth 2 times daily with meals.  Qty: 180 tablet, Refills: 1    Associated Diagnoses: Non-ischemic cardiomyopathy (HCC); Aortic valve disorder      cholecalciferol, vitamin D3, (VITAMIN D3) 50 mcg (2,000 unit) Tab tablet Take 2,000 units by mouth every morning.      dexAMETHasone (DECADRON) 4 mg tablet Take 2 tablets by mouth daily with breakfast. Take 8mg  by mouth once daily for three days starting day 2 of each cycle.  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  Qty: 0.6 mL, Refills: 2    Associated Diagnoses: Anaphylaxis, initial encounter      ferrous gluconate (FERGON) 324 mg (38 mg iron) tablet Take 1 tablet by mouth daily with breakfast.  Qty: 30 tablet, Refills: 11    Associated Diagnoses: Anemia, unspecified type      HYDROcodone-acetaminophen (NORCO) 5-325 mg per tablet Take 1 tablet by mouth every 6 hours as needed for Pain.  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  Qty: 30 tablet, Refills: 3    Associated Diagnoses: Malignant neoplasm of left breast in female, estrogen receptor positive, unspecified site of breast       prochlorperazine (COMPAZINE) 10 mg tablet Take 1 tablet by mouth every 6 hours as needed. For break through nausea/vomiting.  Qty: 30 tablet, Refills: 3    Associated Diagnoses: Malignant neoplasm of left breast in female, estrogen receptor positive, unspecified site of breast              Review of systems: see HPI for further  details  Cannot complete secondary to altered mental status      Physical Exam:     Initial Vitals [03/07/22 1100]   BP (!) 83/52   Pulse (!) 141   Resp (!) 44   Temp 37.6 ?C (99.6 ?F)   SpO2 97 %         GEN: Ill-appearing elderly woman laying in gurney GCS 3.  HEENT: Pupils are equal, round and nonreactive to light.  Dry cornea noted.  Extraocular muscles are intact. There is no scleral icterus or injection. Mucus membranes are moist.  NECK: Supple and non-tender. No jugular venous distention appreciated.   CV: Tachycardia.  RESP: No increased work of breathing.   ABD: Soft and non-tender.  EXT: Warm and well-perfused without significant edema.  BACK: Splotchy ecchymotic skin changes.  Sacral decubitus ulcer noted.  NEURO: Alert and oriented x 3. CN 2-12 are grossly intact. Moving all extremities purposefully. No focal motor  or sensory deficits  SKIN: no rash    Differential Diagnosis:  The differential diagnosis is broad and complex and includes but is not limited to   Sepsis  Septic shock  Ascending cholangitis  Pyelonephritis  CVA        ED Course:   Lab  Rocephin  Vancomycin  Fluids  Levophed  CT        Results:     Labs:  Results for orders placed or performed during the hospital encounter of 03/07/22 (from the past 8 hour(s))   CBC with Auto Differential -STAT    Collection Time: 03/07/22 11:03 AM   Result Value Ref Range    WBC 4.9 4.5 - 11.0 10*3/?L    RBC 2.93 (L) 3.80 - 5.60 10*6/?L    Hemoglobin 9.1 (L) 11.7 - 16.5 g/dL    HCT 16.1 (L) 09.6 - 48.0 %    MCV 92.2 81.0 - 100.0 fL    MCH 31.0 28.3 - 33.3 pg    MCHC 33.6 32.5 - 36.0 g/dL    RDW 04.5 (H) 40.9 - 16.1 %    Platelet Count 68 (L) 150 - 405 10*3/?L    MPV 8.8 6.9 - 10.0 fL   Comprehensive Metabolic Panel -STAT    Collection Time: 03/07/22 11:03 AM   Result Value Ref Range    Sodium 137 135 - 145 mmol/L    Potassium 3.74 3.50 - 5.10 mmol/L    Chloride 100.8 (L) 101.0 - 111.0 mmol/L    CO2 - Carbon Dioxide 17 17 - 27 mmol/L    BUN 65 (H) 8 - 20  mg/dL    Creatinine 8.11 (H) 0.60 - 1.30 mg/dL    Glucose 914 (H) 74 - 106 mg/dL    Calcium 8.1 (L) 8.6 - 10.6 mg/dL    AST - Aspartate Aminotransferase 237 (H) 5 - 32 IU/L    ALT - Alanine Aminotransferase 74 (H) 5 - 33 IU/L    Alkaline Phosphatase 61 35 - 105 IU/L    Protein Total 7.0 6.4 - 8.2 g/dL    Albumin 3.4 (L) 3.5 - 5.0 g/dL    Bilirubin Total 7.82 0.10 - 1.70 mg/dL    Anion Gap 95.6 (H) 9.0 - 18.0 mmol/L    Albumin/Globulin Ratio 0.9 (L) 1.2 - 2.2    Glomerular Filtration Rate Estimate (Female) 11 (L) >=60 mL/min/1.59m*2    GFR Additional Info      BUN / Creatinine Ratio 15.4 12.0 - 20.0    Osmolality Calculation 294.9 275.0 - 300.0 mOsm/kg   Cellavision Differential -Next Routine    Collection Time: 03/07/22 11:03 AM   Result Value Ref Range    WBC 4.9 4.5 - 11.0 10*3/?L    Neutrophils % 91 (H) 50 - 74 %    Lymphocytes % 6 (L) 20 - 40 %    Monocytes % 1 (L) 2 - 12 %    Eosinophils % 0 0 - 5 %    Basophils % 0 0 - 2 %    Bands % 2 0 - 2 %    Neutrophils, Absolute 4.5 2.0 - 7.4 10*3/?L    Lymphocytes, Absolute 0.3 (L) 1.0 - 4.0 10*3/?L    Monocytes, Absolute 0.0 (L) 0.2 - 1.0 10*3/?L    Eosinophils, Absolute 0.0 0.0 - 0.4 10*3/?L    Basophils, Absolute 0.0 0.0 - 0.1 10*3/?L    Bands, Absolute 0.1 0 10*3/?L    Total Cells Counted 100  Platelet Estimate Decreased (A) Adequate    Toxic Granulation Present (A) (none)    Vacuolated Neutrophils Present (A) (none)    Anisocytosis 1+ (A) (none)   Protime Panel -STAT    Collection Time: 03/07/22 11:03 AM   Result Value Ref Range    Protime 14.4 (H) 10.0 - 13.2 Seconds    INR 1.3 (H) 0.9 - 1.1   CK (Creatine Kinase) -Next Routine    Collection Time: 03/07/22 11:03 AM   Result Value Ref Range    CK (Creatine Kinase) 2,664 (H) 26 - 192 IU/L   Thyroid Stimulating Hormone -Next Routine    Collection Time: 03/07/22 11:03 AM   Result Value Ref Range    TSH - Thyroid Stimulating Hormone 2.31 0.34 - 5.60 ?IU/mL   Magnesium -Next Routine    Collection Time: 03/07/22 11:03  AM   Result Value Ref Range    Magnesium 1.6 1.3 - 2.5 mg/dL   Phosphorus -Next Routine    Collection Time: 03/07/22 11:03 AM   Result Value Ref Range    Phosphorus 4.1 2.5 - 4.7 mg/dL   Lipase -Next Routine    Collection Time: 03/07/22 11:03 AM   Result Value Ref Range    Lipase 532 (H) 22 - 51 IU/L   Respiratory Pathogen Panel by PCR (w/COVID-19 and B. parapertussis) -STAT Nasopharynx Swab in Viral Transport Media    Collection Time: 03/07/22 11:09 AM    Specimen: Nasopharynx; Swab in Viral Transport Media   Result Value Ref Range    Adenovirus Not Detected Not Detected    Coronavirus 229E Not Detected Not Detected    Coronavirus HKU1 Not Detected Not Detected    Coronavirus NL63 Not Detected Not Detected    Coronavirus OC43 Not Detected Not Detected    SARS-CoV-2 (COVID-19) by RT-PCR Not Detected Not Detected    Human Metapneumovirus Not Detected Not Detected    Human Rhinovirus/Enterovirus Not Detected Not Detected    Influenza A Not Detected Not Detected    Influenza B Not Detected Not Detected    Parainfluenza Virus 1 Not Detected Not Detected    Parainfluenza Virus 2 Not Detected Not Detected    Parainfluenza Virus 3 Not Detected Not Detected    Parainfluenza Virus 4 Not Detected Not Detected    Respiratory Syncytial Virus Not Detected Not Detected    Bordetella parapertussis Not Detected Not Detected    Bordetella pertussis Not Detected Not Detected    Chlamydophila pneumoniae Not Detected Not Detected    Mycoplasma pneumoniae Not Detected Not Detected   Blood Gas - Arterial w/ Lactate CG4 -Once    Collection Time: 03/07/22 11:12 AM   Result Value Ref Range    Lactate (Blood Gas) 1.9 0.5 - 1.9 mmol/L    Allen Test Pass     pH Arterial 7.521 (H) 7.350 - 7.450    pCO2 Arterial 16.9 (LCrit) 34.0 - 46.0 mmHg    pO2 Arterial 74.0 (L) 75.0 - 100.0 mmHg    HCO3 Arterial 13.8 (LCrit) 22.0 - 26.0 mmol/L    Base Excess -9 (L) (-4) - (4) mmol/L    sO2 Arterial 97 >=90 %    Sample Type Arterial     Specimen Collection  Site Art Puncture     O2 Delivery Device NRB     O2 Liter Flow 15     Spontaneous Respiratory Rate 44    Urinalysis with Microscopic, Culture if Indicated -STAT Urine Urine, Void: Clean Catch (mid stream)  Collection Time: 03/07/22 11:21 AM    Specimen: Urine, Void: Clean Catch (mid stream)   Result Value Ref Range    Color, Urine Yellow Colorless, Straw, Yellow, Dark Yellow    Clarity, Urine Turbid (A) Clear    Glucose, Urine Negative Negative mg/dL    Bilirubin, Urine Negative Negative    Ketones, Urine Negative Negative mg/dL    Specific Gravity, Urine 1.011 1.005 - 1.030    Blood, Urine Moderate (A) Negative    pH, UA 5.0 5.0 - 7.5    Protein, Urine >=500 (A) Negative mg/dL    Urobilinogen, Urine <2.0 <2.0 mg/dL    Nitrite, Urine Negative Negative    Leukocyte Esterase, Urine Moderate (A) Negative    Ascorbic Acid, Urine Negative Negative    WBC, Urine >100 (A) 0 - 4 /HPF    WBC Clumps, Urine Few (A) None Seen /HPF    RBC, Urine 67 (A) 0 - 4 /HPF    Bacteria, Urine 4+ (A) 0 /HPF    Squamous Epithelial, UA 0 0 - 4 /HPF    Renal/Transitional Epithelial, Urine 0 0 - 2 /HPF    Mucous, Urine 1+ (A) 0 /LPF    Granular Casts, Urine >50 (A) 0 /LPF   Blood Gas - Arterial w/ Lactate CG4 -Once    Collection Time: 03/07/22  2:51 PM   Result Value Ref Range    Lactate (Blood Gas) 1.6 0.5 - 1.9 mmol/L    Allen Test Pass     pH Arterial 7.473 (H) 7.350 - 7.450    pCO2 Arterial 19.7 (LCrit) 34.0 - 46.0 mmHg    pO2 Arterial 103.0 (H) 75.0 - 100.0 mmHg    HCO3 Arterial 14.5 (LCrit) 22.0 - 26.0 mmol/L    Base Excess -9 (L) (-4) - (4) mmol/L    sO2 Arterial 99 >=90 %    Sample Type Arterial     Specimen Collection Site Art Line     O2 Delivery Device NRB     O2 Liter Flow 15     Spontaneous Respiratory Rate 28    Blood Gas - Arterial w/ Lactate CG4 -Once    Collection Time: 03/07/22  4:38 PM   Result Value Ref Range    Lactate (Blood Gas) 1.5 0.5 - 1.9 mmol/L    Allen Test N/A     pH Arterial 7.474 (H) 7.350 - 7.450    pCO2  Arterial 18.0 (LCrit) 34.0 - 46.0 mmHg    pO2 Arterial 60.0 (L) 75.0 - 100.0 mmHg    HCO3 Arterial 13.2 (LCrit) 22.0 - 26.0 mmol/L    Base Excess -10 (L) (-4) - (4) mmol/L    sO2 Arterial 93 >=90 %    Sample Type Arterial     Specimen Collection Site Art Line     O2 Delivery Device Oxymask     O2 Liter Flow 3     Spontaneous Respiratory Rate 38    Basic Metabolic Panel -STAT    Collection Time: 03/07/22  5:07 PM   Result Value Ref Range    Sodium 138 135 - 145 mmol/L    Potassium 3.56 3.50 - 5.10 mmol/L    Chloride 103.3 101.0 - 111.0 mmol/L    CO2 - Carbon Dioxide 13 (LCrit) 17 - 27 mmol/L    BUN 63 (H) 8 - 20 mg/dL    Creatinine 4.03 (H) 0.60 - 1.30 mg/dL    Glucose 474 (H) 74 - 106 mg/dL    Calcium  7.0 (L) 8.6 - 10.6 mg/dL    Anion Gap 29.5 (H) 9.0 - 18.0 mmol/L    Glomerular Filtration Rate Estimate (Female) 11 (L) >=60 mL/min/1.32m*2    GFR Additional Info      BUN / Creatinine Ratio 15.4 12.0 - 20.0    Osmolality Calculation 296.5 275.0 - 300.0 mOsm/kg   Hgb & Hct -STAT    Collection Time: 03/07/22  5:07 PM   Result Value Ref Range    Hemoglobin 7.5 (L) 11.7 - 16.5 g/dL    HCT 62.1 (L) 30.8 - 48.0 %       Radiology:  Echo 2D complete w doppler wo contrast   Final Result by Tiney Rouge, MD (09/26 1746)   Echo   ---------------------------------------------------------------------------   ------------------------------   Dennie Bible. Name: ANNISA, ARNN Study Date: 03/07/2022 2:11pm   Pat. NO: [id number] Referring  MD: Brooke Dare   Site: [facility] ED Sonographer: Elwanda Brooklyn, BS, RDCS   DOB: Oct 27, 1955 Age: 80   ---------------------------------------------------------------------------   ------------------------------      PROCEDURE/STUDY QUALITY   ----------------------------------------   ECHO 2D COMPLETE W DOPPLER W CONTRAST - A transthoracic study was    performed including 2D, M-mode, spectral Doppler, and color-flow Doppler    imaging with   Lumason contrast. View: This study is technically suboptimal due to the     patient's inability to be optimally positioned for the exam.            CONTRAST   ----------------------------------------   Lumason contrast was used to opacify the left ventricular cavity. Please    see left ventricular section for observations regarding the use of this    agent.            INDICATION   ----------------------------------------   Septic shock, bovine aortic valve, assess for vegetations            MEASURE   ----------------------------------------   LVEDV (MOD A4C)  116.4 ml  [48.0-140.0]   LV HR  126 bpm   RVIDd  3.4 cm   RV Major  6.8 cm   RV Minor  4.3 cm   RAA (s)  15.7 cm?  [10.0-18.0]   LVOT Diam  2.0 cm   Sinus. of Valsalva Diam   3.4 cm  [2.7-3.3]   Ao st junct Diam   3.2 cm  [2.3-2.9]   Ao Asc Diam   3.8 cm  [2.3-3.1]   LVOT Vmax  1.17 m/s   LVOT Vmean  0.90 m/s   LVOT max PG  5.48 mmHg   LVOT mean PG  3.75 mmHg   LVOT VTI  21.8 cm   LVOT SV  69.3 ml   LVOT SVI  33.9 ml/m?   LVOT CO  8.19 l/min   LVOT CI  4.02 l/min/m?   AV Vmax  2.51 m/s   AV max PG  30.33 mmHg   AV Vmean  1.89 m/s   AV mean PG  15.89 mmHg   AV VTI  39.6 cm   Dimensionless Index VTI  0.55   AVA (Vmax)  1.5 cm?   AVA (VTI)  1.7 cm?   AVAI (VTI)  0.9 cm?/m?   TR Vmax  2.59 m/s   TR max PG  26.8 mmHg   MV E' Sept  10 cm/s  [6-15]   MV E' Lat  12.1 cm/s  [5.9-19.9]            LEFT VENTRICLE   ----------------------------------------   Left  ventricular cavity size is normal. There is mild concentric left    ventricular hypertrophy. Left ventricular EF 40 - 45 %. Unable to assess    diastolic function in the setting of   tachycardia.            RIGHT VENTRICLE   ----------------------------------------   Right ventricle is mildly dilated with decreased systolic function.            LEFT ATRIUM   ----------------------------------------   The left atrium appears normal in size.            RIGHT ATRIUM   ----------------------------------------   The right atrial size is normal.            AORTA/AORTIC ROOT    ----------------------------------------   The sinuses of Valsalva appears dilated, measuring 3.4 cm. The ascending    aorta appears dilated, measuring 3.8 cm.            AORTIC VALVE   ----------------------------------------   There is a normally functioning bioprosthetic aortic valve. It is well    seated and does not rock. Transvalvular gradients are within expected    limits for the valve. There is no   evidence of aortic stenosis. There is no evidence of aortic regurgitation.            MITRAL VALVE   ----------------------------------------   The mitral valve appears structurally normal. The mitral valve has mild    regurgitation.            TRICUSPID VALVE   ----------------------------------------   The tricuspid valve appears structurally normal. The tricuspid valve has    trace/physiological regurgitation. The RSVP is estimated at 27-32 mmHg,    which is normal.            PULMONIC VALVE   ----------------------------------------   Pulmonic valve appears structurally normal. The pulmonic valve has trace    regurgitation.            PERICARDIUM   ----------------------------------------   There is no pericardial effusion.            IMPRESSION   ----------------------------------------         1. Left ventricular cavity size is normal. There is mild concentric left    ventricular hypertrophy. Left ventricular EF 40 - 45 %. Unable to assess    diastolic function in the setting   of tachycardia.   2. Right ventricle is mildly dilated with decreased systolic function.   3. The sinuses of Valsalva appears dilated, measuring 3.4 cm. The    ascending aorta appears dilated, measuring 3.8 cm.   4. 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 It is well    seated and does not rock. Transvalvular gradients are within expected    limits for the valve.   5. The mitral valve has mild regurgitation.   6. The tricuspid valve appears structurally normal. The tricuspid valve    has trace/physiological  regurgitation. The RSVP is estimated at 27-32    mmHg, which is normal.   7. Pulmonic valve appears structurally normal. The pulmonic valve has    trace regurgitation.   8. No evidence of vegetation.   9. No prior study available for comparison.               X-ray Chest 1 View   Final Result by Daria Pastures, MD (09/26 1408)   EXAMINATION:   XR CHEST 1 VIEW      HISTORY:  AHRF.      COMPARISON:   12/01/2021      FINDINGS:   LINES/TUBES: Right-sided Port-A-Cath in good position.  Right IJ catheter    with tip in the right atrium.   LUNGS: Bibasilar atelectasis with mild central vascular congestion.  No    asymmetric consolidations or mass.   CARDIAC: Mild cardiomegaly.   MEDIASTINUM: No superior mediastinal widening. 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   PLEURA: No evidence of pleural effusion or pneumothorax.   OTHER: Negative.         IMPRESSION:   1. See Above.      NOTE:   This dictation was produced using voice recognition software. Although    effort has been made to minimize transcription errors, homonyms and other    transcription errors may be present and may not truly reflect my intent.    Please excuse any unintentional    verbiage, or grammatical errors.      Dictated by: Marlowe Shores   Electronically Signed by: Marlowe Shores on 03/07/2022 2:08 PM      Report ID#: [id number]            CT head without contrast   Final Result by Rogene Houston, MD (09/26 1225)   EXAMINATION:   CT HEAD WO CONTRAST      HISTORY:   low gcs, met breast CA      COMPARISON:   None.      TECHNIQUE:   CT images were obtained without intravenous contrast. Automated exposure    control based on patient size was utilized as a dose reduction technique.      FINDINGS:   INTRACRANIAL HEMORRHAGE:  Trace focal subarachnoid hemorrhage noted in a    sulcus overlying the right superior parietal lobe (coronal 39).   VENTRICLES and EXTRA-AXIAL SPACES:  No hydrocephalus.  No extra-axial    collection. The  basal cisterns are patent.   BRAIN:  No mass effect or midline shift.  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  There    is age-related global volume loss. Nonspecific areas of periventricular    and subcortical white matter    hypoattenuation likely due to age-related chronic microvascular disease.   BONES and SOFT TISSUES:  Unremarkable.      COMMUNICATION   The critical information above was relayed directly by me by telephone to    Brooke Dare immediately upon discovery on 03/07/2022 at 12:07 pm.         IMPRESSION:   -Trace focal subarachnoid hemorrhage noted in a sulcus overlying the right    superior parietal lobe, source unclear.  Recommend clinical correlation    and further evaluation with CT angiogram of the head and/or    contrast-enhanced MRI of the brain, as    appropriate.   -No hydrocephalus or mass effect.   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   -Chronic age-related changes.      Dictated by: Rogene Houston   Electronically Signed by: Rogene Houston on 03/07/2022 12:25 PM      Report ID#: [id number]            Ultrasound right upper quadrant    (Results Pending)   Ultrasound kidney    (Results Pending)   CT chest abdomen pelvis without IV contrast :    (Results Pending)   CT  head without contrast    (Results Pending)       ECG:   An ECG was contemporaneously evaluated and shows tachycardia with a rate of 142 normal axis, ST depressions precordial leads.      Medical Decision Making:     The patient presented with septic shock of presumed urinary source and altered mental status, essentially comatose not responsive to noxious stimuli.  Unstable in terms of hemodynamics with tachycardia and hypotension.  Given IV fluids, minimally responsive thus started on Levophed.  Treated with ceftriaxone and vancomycin.  Patient is critically ill, bedside ultrasound  demonstrating no evidence of hydronephrosis bilaterally.  Gallbladder does not appear to be distended or showing overt signs of acute cholecystitis.  Ultimately admitted to medicine team for ongoing management.      Disposition:  admit    Condition:  Critical    Critical care time: 35 minutes exclusive of separately billable procedures.    This critical care time includes but is not limited to time at bedside, interpreting test results, discussing the case with staff, and time spent discussing management options with the patient's family.       Impression:    SNOMED CT(R)   1. Septic shock (HCC)  SEPTIC SHOCK   2. Acute pyelonephritis  ACUTE PYELONEPHRITIS   3. Acute renal failure, unspecified acute renal failure type (HCC)  ACUTE KIDNEY INJURY   4. SAH (subarachnoid hemorrhage) (HCC)  HEMORRHAGE INTO SUBARACHNOID SPACE OF NEURAXIS   5. Encephalopathy acute  DISORDER OF BRAIN             NOTE:  This dictation was produced using voice recognition software. Although effort has been made to minimize transcription errors, homonyms and other transcription errors may be present and may not truly reflect my intent.      Suella Grove. Mayford Knife, MD, MS, [facility]  Board Certified Emergency Physician  [facility]             Brooke Dare, [place]  03/07/22 [phone]

## 2022-03-07 NOTE — H&P (Signed)
HISTORY AND PHYSICAL EXAMINATION  Resident Medical Team    Pt. Name/Age/DOB: Brandy [name]     66 y.o.   May 30, 1956       Medical Record Number:   [id number]  CSN: [id number]  Date of admission:  03/07/2022  Primary Care Physician:  Bretta Bang  Admitting Physician:  Corinna Gab   Room: 512/512-01    Subjective     Chief Complaint:  Chief Complaint   Patient presents with   . Altered Mental Status       HPI:  Brandy [name] is a 66 y.o. 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     History is collected from her husband. He reports she became acutely weak one day prior to presentation. She had to lock her arms around his neck and he would lift her to make it to the bathroom. Upon waking up this morning, she was non-responsive. EMS was called to help move her, and found her in dire condition, bringing her to the emergency room. Otherwise, her husband is a poor historian and communication is difficult due to hearing loss on his part.     ED Course  Found to be hypotensive, tachycardic, and hypoxic. Laboratory studies significant for multisystem failure. Imaging of head significant for a small subarachnoid hemorrhage. Received 1L LR, levophed, CTX, and vancomycin.     Review of Systems   Unable to perform ROS: Acuity of condition       Past Medical History:   Diagnosis Date   . Arrhythmia    . CHF (congestive heart failure) (HCC)    . Colon cancer (HCC) 12/28/2016   . Colonic mass 11/28/2016    POSTOPERATIVE DIAGNOSIS 11/28/16 colonoscopy:  1) Colonoscopy with 3/4 circumference apple core lesion at full insertion of the colonoscope but likely distal to the Hepatic flexure 2) Hemorrhoids: mild internal 3) Mass: hot forceps biopsied times two without complications and  tattooed times two proximal to the mass  Biopsy suspicious for intramucosal adenocarcinoma. Referral to Dr Oran Rein done and she   . Encounter for screening colonoscopy 12/05/2017   . History of blood transfusion    . HIT (heparin-induced thrombocytopenia) (HCC)    . Hyperlipidemia    . 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 Screening for colon cancer 10/04/2016    POSTOPERATIVE DIAGNOSIS 11/28/16 colonoscopy:  1) Colonoscopy with 3/4 circumference apple core lesion at full insertion of the colonoscope but likely distal to the Hepatic flexure 2) Hemorrhoids: mild internal 3) Mass: hot forceps biopsied times two without complications and tattooed times two proximal to the mass  Biopsy suspicious for intramucosal adenocarcinoma. Referral to Dr Oran Rein done and she   . Thyroid disease     Hyperparathyroid     Past Surgical History:   Procedure Laterality Date   . CARDIAC SURGERY  01/23/2016    AVR   . COLON SURGERY  2018    Hemicolectomy   . COLONOSCOPY N/A 05/04/2021    Procedure: COLONOSCOPY; W/ POSSIBLE BIOPSY & POLYPECTOMY;  Surgeon: Truitt Merle, MD;  Location: [facility] OR;  Service: [facility] Procedures;  Laterality: N/A;   . CORONARY ANGIOPLASTY     . DILATION AND CURETTAGE OF UTERUS N/A 09/16/2021    Procedure: DILATATION AND CURETTAGE; HYSTEROSCOPY with myosure;  Surgeon: Conni Elliot, MD;  Location: [facility] OR;  Service: [facility] Procedures;  Laterality: N/A;   . PROCEDURE N/A 01/27/2016    Procedure: AORTIC VALVE REPLACEMENT; INTRA AORTIC BALLOON INSERTION;  Surgeon: Otho Bellows, MD;  Location: [facility] OR;  Service: Cardiology;  Laterality: N/A;   . PROCEDURE Right 03/10/2016    Procedure: AMPUTATION TOE;  Surgeon: Chrystie Nose, DPM;  Location: [facility] OR;  Service: Podiatry;  Laterality: Right;   . PROCEDURE N/A 11/28/2016    Procedure: COLONOSCOPY; BIOPSY;  Surgeon: Gailen Shelter, MD;  Location: [facility] OR;  Service: [facility] Procedures;  Laterality: N/A;  ink spot  marking apple core mass proximal lesion distal to hepatic flexure   . PROCEDURE Right 12/28/2016    Procedure: LAPAROPSCOPIC CONVERTED TO OPEN HAND ASSISTED RIGHT HEMI COLECTOMY;  Surgeon: Truitt Merle, MD;  Location: [facility] OR;  Service: [facility] Procedures;  Laterality: Right;  Dr. Oran Rein wants 3 hours   . PROCEDURE N/A 01/15/2018    Procedure: COLONOSCOPY; W/ POSSIBLE BIOPSY & POLYPECTOMY;  Surgeon: Truitt Merle, MD;  Location: [facility] OR;  Service: [facility] Procedures;  Laterality: N/A;   . PROCEDURE N/A 03/19/2018    Procedure: PARATHYROIDECTOMY; RECURRENT LARYNGEAL NERVE MONITORING, INTRAOPERATIVE PARATHYROID HORMONE;  Surgeon: Edmund Hilda, MD;  Location: [facility] OR;  Service: Ear Nose and Throat;  Laterality: N/A;   . PROCEDURE Left 10/11/2021    Procedure: RIGHT SUBCLAVIAN PORT PLACEMENT;  Surgeon: Truitt Merle, MD;  Location: [facility] OR;  Service: [facility] Procedures;  Laterality: Left;   . TONSILLECTOMY     . TUBAL LIGATION  1980   . US GUIDED LYMPH NODE BIOPSY  09/15/2021    US GUIDED LYMPH NODE BIOPSY 09/15/2021 Jeanice Lim, MD [facility] [place] IMG       Allergies   Allergen Reactions   . Bee Sting [Allergen Ext-Venom-Honey Bee] Anaphylaxis     Patient reports an epi-pen    . Heparin Anaphylaxis     Possible HIT   . Sulfa (Sulfonamide Antibiotics) Rash     In ICU for 3 days post dose         Prior to Admission medications    Medication Sig Start Date End Date Taking? Authorizing Provider   aspirin 81 MG EC tablet Take 81 mg by mouth every morning.    Historical Provider, MD   atorvastatin (LIPITOR) 20 mg tablet Take 1 tablet by mouth every night at bedtime.  Patient taking differently: Take 20 mg by mouth every night at bedtime. 07/26/21   Bretta Bang, DO   calcium carbonate/vitamin D3 (CALCIUM 600 + D,3, ORAL) Take 1 tablet by mouth every morning.    Historical Provider, MD   carvediloL (COREG) 6.25 mg tablet Take 1 tablet by mouth 2 times daily with meals. 01/13/22   Bretta Bang, DO   cholecalciferol, vitamin D3, (VITAMIN D3) 50 mcg (2,000 unit)  Tab tablet Take 2,000 units by mouth every morning.    Historical Provider, MD   dexAMETHasone (DECADRON) 4 mg tablet Take 2 tablets by mouth daily with breakfast. Take 8mg  by mouth once daily for three days starting day 2 of each cycle. 01/19/22   West Pugh, FNP   EPINEPHrine (EPIPEN) 0.3 mg/0.3 mL AtIn auto-injector Inject 0.3 mL into the muscle once as needed. 02/27/22   Bretta Bang, DO   ferrous gluconate (FERGON) 324 mg (38 mg iron) tablet Take 1 tablet by mouth daily with breakfast. 02/09/22 02/09/23  Lindley Magnus, MD   HYDROcodone-acetaminophen (NORCO) 5-325 mg per tablet Take 1  tablet by mouth every 6 hours as needed for Pain. 12/08/21   Lindley Magnus, MD   ondansetron (ZOFRAN-ODT) 8 mg disintegrating tablet Take 1 tablet by mouth every 8 hours as needed for Nausea. 10/19/21   Lindley Magnus, MD   prochlorperazine (COMPAZINE) 10 mg tablet Take 1 tablet by mouth every 6 hours as needed. For break through nausea/vomiting. 10/19/21   Lindley Magnus, MD       Family History   Non-contributory.     Social History  Lives with her husband at home. No history of substance use, tobacco use, or EtOH use.     Objective     Vitals on Admission:  Temp Blood Pressure Heart Rate Resp Rate O2 Sats   Temp: 37.6 ?C (99.6 ?F) BP: (!) 83/52 Pulse: (!) 141 Resp: (!) 44      SpO2: 97 % on 3 L/min   Admission Weight: Weight: 228 lb (103.4 kg)       BMI: Body mass index is 40.39 kg/m?Marland Kitchen    Physical Examination:  General: Very acutely ill, non-responsive, elderly woman laying in bed, scalp hair is shaved short, stool present on feet, pale  HEENT: MMD. No LAD. Pupils constricted, left reactive to light, right non-reactive. Conjunctiva dry.  Pulm: 100% SpO2 on 3L oximask, Heaving breaths with accessory muscle use. CTAB.   CV: Tachycardic, regular rhythm. No MRG. Pedalis pulses not palpable.   Abd: Soft, no tenderness, rebound, or guarding. BS +. No masses.  MSK: Limbs cool and pale.   Skin: Scattered ecchymosis (lividity?) and pressure  wounds over back and legs. Capillary refill 3-4s  Neuro: Occasional, small, purposeless movements of the legs and arms, otherwise no verbal response, does not open eyes, does not recoil from painful stimuli. Decorticate posturing present, biceps and hands stiff. GCS overall is 3 (1/1/1).   Psych: Unable to assess.      EKG: Sinus tachycardic with PVCs    Key Labs Summarized:    Recent Results (from the past 48 hour(s))   CBC with Auto Differential -STAT    Collection Time: 03/07/22 11:03 AM   Result Value Ref Range    WBC 4.9 4.5 - 11.0 10*3/?L    RBC 2.93 (L) 3.80 - 5.60 10*6/?L    Hemoglobin 9.1 (L) 11.7 - 16.5 g/dL    HCT 16.1 (L) 09.6 - 48.0 %    MCV 92.2 81.0 - 100.0 fL    MCH 31.0 28.3 - 33.3 pg    MCHC 33.6 32.5 - 36.0 g/dL    RDW 04.5 (H) 40.9 - 16.1 %    Platelet Count 68 (L) 150 - 405 10*3/?L    MPV 8.8 6.9 - 10.0 fL   Comprehensive Metabolic Panel -STAT    Collection Time: 03/07/22 11:03 AM   Result Value Ref Range    Sodium 137 135 - 145 mmol/L    Potassium 3.74 3.50 - 5.10 mmol/L    Chloride 100.8 (L) 101.0 - 111.0 mmol/L    CO2 - Carbon Dioxide 17 17 - 27 mmol/L    BUN 65 (H) 8 - 20 mg/dL    Creatinine 8.11 (H) 0.60 - 1.30 mg/dL    Glucose 914 (H) 74 - 106 mg/dL    Calcium 8.1 (L) 8.6 - 10.6 mg/dL    AST - Aspartate Aminotransferase 237 (H) 5 - 32 IU/L    ALT - Alanine Aminotransferase 74 (H) 5 - 33 IU/L    Alkaline Phosphatase 61 35 - 105 IU/L  Protein Total 7.0 6.4 - 8.2 g/dL    Albumin 3.4 (L) 3.5 - 5.0 g/dL    Bilirubin Total 1.61 0.10 - 1.70 mg/dL    Anion Gap 09.6 (H) 9.0 - 18.0 mmol/L    Albumin/Globulin Ratio 0.9 (L) 1.2 - 2.2    Glomerular Filtration Rate Estimate (Female) 11 (L) >=60 mL/min/1.54m*2    GFR Additional Info      BUN / Creatinine Ratio 15.4 12.0 - 20.0    Osmolality Calculation 294.9 275.0 - 300.0 mOsm/kg   Cellavision Differential -Next Routine    Collection Time: 03/07/22 11:03 AM   Result Value Ref Range    WBC 4.9 4.5 - 11.0 10*3/?L    Neutrophils % 91 (H) 50 - 74 %     Lymphocytes % 6 (L) 20 - 40 %    Monocytes % 1 (L) 2 - 12 %    Eosinophils % 0 0 - 5 %    Basophils % 0 0 - 2 %    Bands % 2 0 - 2 %    Neutrophils, Absolute 4.5 2.0 - 7.4 10*3/?L    Lymphocytes, Absolute 0.3 (L) 1.0 - 4.0 10*3/?L    Monocytes, Absolute 0.0 (L) 0.2 - 1.0 10*3/?L    Eosinophils, Absolute 0.0 0.0 - 0.4 10*3/?L    Basophils, Absolute 0.0 0.0 - 0.1 10*3/?L    Bands, Absolute 0.1 0 10*3/?L    Total Cells Counted 100     Platelet Estimate Decreased (A) Adequate    Toxic Granulation Present (A) (none)    Vacuolated Neutrophils Present (A) (none)    Anisocytosis 1+ (A) (none)   Protime Panel -STAT    Collection Time: 03/07/22 11:03 AM   Result Value Ref Range    Protime 14.4 (H) 10.0 - 13.2 Seconds    INR 1.3 (H) 0.9 - 1.1   CK (Creatine Kinase) -Next Routine    Collection Time: 03/07/22 11:03 AM   Result Value Ref Range    CK (Creatine Kinase) 2,664 (H) 26 - 192 IU/L   Respiratory Pathogen Panel by PCR (w/COVID-19 and B. parapertussis) -STAT Nasopharynx Swab in Viral Transport Media    Collection Time: 03/07/22 11:09 AM    Specimen: Nasopharynx; Swab in Viral Transport Media   Result Value Ref Range    Adenovirus Not Detected Not Detected    Coronavirus 229E Not Detected Not Detected    Coronavirus HKU1 Not Detected Not Detected    Coronavirus NL63 Not Detected Not Detected    Coronavirus OC43 Not Detected Not Detected    SARS-CoV-2 (COVID-19) by RT-PCR Not Detected Not Detected    Human Metapneumovirus Not Detected Not Detected    Human Rhinovirus/Enterovirus Not Detected Not Detected    Influenza A Not Detected Not Detected    Influenza B Not Detected Not Detected    Parainfluenza Virus 1 Not Detected Not Detected    Parainfluenza Virus 2 Not Detected Not Detected    Parainfluenza Virus 3 Not Detected Not Detected    Parainfluenza Virus 4 Not Detected Not Detected    Respiratory Syncytial Virus Not Detected Not Detected    Bordetella parapertussis Not Detected Not Detected    Bordetella pertussis Not  Detected Not Detected    Chlamydophila pneumoniae Not Detected Not Detected    Mycoplasma pneumoniae Not Detected Not Detected   Blood Gas - Arterial w/ Lactate CG4 -Once    Collection Time: 03/07/22 11:12 AM   Result Value Ref Range    Lactate (  Blood Gas) 1.9 0.5 - 1.9 mmol/L    Allen Test Pass     pH Arterial 7.521 (H) 7.350 - 7.450    pCO2 Arterial 16.9 (LCrit) 34.0 - 46.0 mmHg    pO2 Arterial 74.0 (L) 75.0 - 100.0 mmHg    HCO3 Arterial 13.8 (LCrit) 22.0 - 26.0 mmol/L    Base Excess -9 (L) (-4) - (4) mmol/L    sO2 Arterial 97 >=90 %    Sample Type Arterial     Specimen Collection Site Art Puncture     O2 Delivery Device NRB     O2 Liter Flow 15     Spontaneous Respiratory Rate 44    Urinalysis with Microscopic, Culture if Indicated -STAT Urine Urine, Void: Clean Catch (mid stream)    Collection Time: 03/07/22 11:21 AM    Specimen: Urine, Void: Clean Catch (mid stream)   Result Value Ref Range    Color, Urine Yellow Colorless, Straw, Yellow, Dark Yellow    Clarity, Urine Turbid (A) Clear    Glucose, Urine Negative Negative mg/dL    Bilirubin, Urine Negative Negative    Ketones, Urine Negative Negative mg/dL    Specific Gravity, Urine 1.011 1.005 - 1.030    Blood, Urine Moderate (A) Negative    pH, UA 5.0 5.0 - 7.5    Protein, Urine >=500 (A) Negative mg/dL    Urobilinogen, Urine <2.0 <2.0 mg/dL    Nitrite, Urine Negative Negative    Leukocyte Esterase, Urine Moderate (A) Negative    Ascorbic Acid, Urine Negative Negative    WBC, Urine >100 (A) 0 - 4 /HPF    WBC Clumps, Urine Few (A) None Seen /HPF    RBC, Urine 67 (A) 0 - 4 /HPF    Bacteria, Urine 4+ (A) 0 /HPF    Squamous Epithelial, UA 0 0 - 4 /HPF    Renal/Transitional Epithelial, Urine 0 0 - 2 /HPF    Mucous, Urine 1+ (A) 0 /LPF    Granular Casts, Urine >50 (A) 0 /LPF   Blood Gas - Arterial w/ Lactate CG4 -Once    Collection Time: 03/07/22  2:51 PM   Result Value Ref Range    Lactate (Blood Gas) 1.6 0.5 - 1.9 mmol/L    Allen Test Pass     pH Arterial 7.473 (H)  7.350 - 7.450    pCO2 Arterial 19.7 (LCrit) 34.0 - 46.0 mmHg    pO2 Arterial 103.0 (H) 75.0 - 100.0 mmHg    HCO3 Arterial 14.5 (LCrit) 22.0 - 26.0 mmol/L    Base Excess -9 (L) (-4) - (4) mmol/L    sO2 Arterial 99 >=90 %    Sample Type Arterial     Specimen Collection Site Art Line     O2 Delivery Device NRB     O2 Liter Flow 15     Spontaneous Respiratory Rate 28         Micro:  Results for orders placed or performed during the hospital encounter of 03/07/22 (from the past 16109 hour(s))   Respiratory Pathogen Panel by PCR (w/COVID-19 and B. parapertussis) -STAT Nasopharynx Swab in Viral Transport Media    Specimen: Nasopharynx; Swab in Viral Transport Media   Result Value Ref Range    Adenovirus Not Detected Not Detected    Coronavirus 229E Not Detected Not Detected    Coronavirus HKU1 Not Detected Not Detected    Coronavirus NL63 Not Detected Not Detected    Coronavirus OC43 Not Detected Not Detected  SARS-CoV-2 (COVID-19) by RT-PCR Not Detected Not Detected    Human Metapneumovirus Not Detected Not Detected    Human Rhinovirus/Enterovirus Not Detected Not Detected    Influenza A Not Detected Not Detected    Influenza B Not Detected Not Detected    Parainfluenza Virus 1 Not Detected Not Detected    Parainfluenza Virus 2 Not Detected Not Detected    Parainfluenza Virus 3 Not Detected Not Detected    Parainfluenza Virus 4 Not Detected Not Detected    Respiratory Syncytial Virus Not Detected Not Detected    Bordetella parapertussis Not Detected Not Detected    Bordetella pertussis Not Detected Not Detected    Chlamydophila pneumoniae Not Detected Not Detected    Mycoplasma pneumoniae Not Detected Not Detected        Recent Imaging:  X-ray Chest 1 View    Result Date: 03/07/2022  EXAMINATION: XR CHEST 1 VIEW HISTORY: AHRF. COMPARISON: 12/01/2021 FINDINGS: LINES/TUBES: Right-sided Port-A-Cath in good position.  Right IJ catheter with tip in the right atrium. LUNGS: Bibasilar atelectasis with mild central vascular  congestion.  No asymmetric consolidations or mass. CARDIAC: Mild cardiomegaly. MEDIASTINUM: No superior mediastinal widening. Post median sternotomy changes with aortic valve replacement noted. PLEURA: No evidence of pleural effusion or pneumothorax. OTHER: Negative.     IMPRESSION: 1. See Above. NOTE: This dictation was produced using voice recognition software. Although effort has been made to minimize transcription errors, homonyms and other transcription errors may be present and may not truly reflect my intent. Please excuse any unintentional verbiage, or grammatical errors. Dictated by: Marlowe Shores Electronically Signed by: Marlowe Shores on 03/07/2022 2:08 PM Report ID#: [id number]     CT head without contrast    Result Date: 03/07/2022  EXAMINATION: CT HEAD WO CONTRAST HISTORY: low gcs, met breast CA COMPARISON: None. TECHNIQUE: CT images were obtained without intravenous contrast. Automated exposure control based on patient size was utilized as a dose reduction technique. FINDINGS: INTRACRANIAL HEMORRHAGE:  Trace focal subarachnoid hemorrhage noted in a sulcus overlying the right superior parietal lobe (coronal 39). VENTRICLES and EXTRA-AXIAL SPACES:  No hydrocephalus.  No extra-axial collection. The basal cisterns are patent. BRAIN:  No mass effect or midline shift.  Subcentimeter hypodense focus in the left posterior cerebellar hemisphere, possibly chronic infarct.  There is age-related global volume loss. Nonspecific areas of periventricular and subcortical white matter hypoattenuation likely due to age-related chronic microvascular disease. BONES and SOFT TISSUES:  Unremarkable. COMMUNICATION The critical information above was relayed directly by me by telephone to Brooke Dare immediately upon discovery on 03/07/2022 at 12:07 pm.     IMPRESSION: -Trace focal subarachnoid hemorrhage noted in a sulcus overlying the right superior parietal lobe, source unclear.  Recommend clinical correlation and further  evaluation with CT angiogram of the head and/or contrast-enhanced MRI of the brain, as appropriate. -No hydrocephalus or mass effect. -Nonspecific subcentimeter hypodense focus in the left posterior cerebellar hemisphere, possibly chronic infarct. -Chronic age-related changes. Dictated by: Rogene Houston Electronically Signed by: Rogene Houston on 03/07/2022 12:25 PM Report ID#: [id number]      Assessment / Plan     Laelia Ottoson is a 66 y.o. 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     Neuro  #Acute Metabolic Encephalopathy   POA. Likely due to severe  sepsis, GCS 3. Treating the underlying causes. Intubation discussed at length with the husband, decided DNR/DNI. Unclear prognosis, posturing on exam, may have hypoxic brain injury from prolonged time of critical illness at home.   - Dilaudid PRN for pain     #Trace Non-Aneurysmal Subarachnoid Hemorrhage  POA. Seen on CT head on admission. Discussed with Neurosurgery by ED physician, gave no recommendations for management.   - Seizure precautions   - Avoid vasospasm-preventing nimodipine 2/2 tedious blood pressures and not recommended per UTD in non-aneurysmal subarachnoid hemorrhages   - Avoid anticoagulation  - Repeat head CT will be considered pending her course and goals of care, not stable enough for MRI     #Hyperthermia  POA. Temperature nearly 105F.   - APAP suppository PRN  - Fans, limit blankets     Cards  #Shock, presumed septic   POA. Severe and refractory. Source still differentiating - query UTI currently.   - Levophed PRN for MAP >65  - Second line: Vasopressin   - Stress-dose hydrocortisone   - S/p 2L LR + LR @ 150 ml/hr     #Severe Aortic Stenosis s/p Bioprosthetic Aortic  Valve - 2017  POA. Complicates care if bacteremic.   - STAT ECHO pending     #HFrEF   POA. Does not appear in exacerbation, appears to have improved following valve replacement in 2017.  - STAT ECHO pending    - Holding carvedilol, atorvastatin, ASA while acutely ill     Pulm  #Acute Hypoxic Respiratory Failure   POA. No baseline O2 use. No known lung disease. Needing 3L oximask on admission.   - RTDP  - Bipap if needed  - NG placed to decompress stomach after bagging     #Respiratory Alkalosis  POA. Tachypneic due to sepsis/hyperthermia.   - Trend ABG     Renal  #Acute Renal Failure   POA. Cr normal at baseline, 4.21 on admission.   - Fluid resuscitation at above  - Foley, strict I/O   - Renal ultrasound   - Pharmacy consulted to review medications     #Anion Gap Metabolic Acidosis  POA. Likely from shock and renal failure. Lactate normal.   - Trending ABG    Infectious  #UTI  POA. May be source of sepsis. Hx pan-sensitive E coli.   - Cefepime 9/26 -   - Vancomycin 9/26 -   - If bacteremic, will need port removed   [ ]  F/u Ucx and Bcx    GI  #Acute Hepatitis   POA. Likely shock liver.   - RUQ [place]   - Trend LFTs     Heme/Onc  #Coagulopathy of Sepsis, Thrombocytopenia  POA. INR elevated.   - Monitor for signs of bleeding, DIC labs if seen  - Avoid heparin and hepar     #Iron deficiency anemia + chemotherapy-induced anemia  POA. Required blood transfusions in the last month.   - Continue to trend H&H    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 Following with Dr. Nadeen Landau at [facility], currently on Docetaxel/Trastuzumab. Metastatic to lymph node. Last treatment 02/09/2022. Required blood transfusions due to chemotherapy.     #Colon cancer s/p colectomy 2018  Complicates care, bowel regimen.     Endo  #Hyperparathyroidism s/p parathyroidectomy 2019  Complicates care. Monitor electrolytes.     MSK/Skin  #Rhabdomyolysis   POA. Mild. McMahon Score: high risk.   - Fluid resuscitation and renal protection measures  described above      #  Pressure Injuries, Ecchymosis  POA. Multiple and scattered across back.   - Wound care consultation  - Frequent position changes     Feeding: NPO  Analgesia: Dilaudid PRN  Sedation: None  Thromboprophylaxis: None - subarachnoid hemorrhage   Head of bed: 30 degrees  Ulcer prophylaxis: PPI  Glucose: 5.5% (07/27/2020); 6.0% (01/26/2016); 5.2% (01/21/2016)   Bowel regimen: PRN  I - Lines/Drains/Airway: CVC R IJ 9/26, R radial arterial line 9/23, NG tube 9/26, Foley 9/26    Code Status: DNR/DNI per multiple conversations with patient's husband      Dispo: ICU in guarded condition     Active Hospital Problems    Diagnosis SNOMED CT(R) Date Noted   . Subarachnoid hemorrhage (HCC) HEMORRHAGE INTO SUBARACHNOID SPACE OF NEURAXIS 03/07/2022   . Acute encephalopathy DISORDER OF BRAIN 03/07/2022   . 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 Liver lesion- small hypodense lesion around falciform lig LESION OF LIVER 01/21/2016        Tasheem Elms BCorinna Gab 03/07/2022 2:59 PM    This patient was seen by Dr. Leavy Cella who agrees with above assessment and plan.

## 2022-03-07 NOTE — Procedures (Signed)
Arterial Line Placement Procedure Note                   Indication: shock    Consent: The spouse was counseled regarding the procedure, its indications, risks, potential complications and alternatives, and any questions were answered. Consent was obtained to proceed.    Allen's Test: Normal    Procedure: The skin over the right radial artery was prepped with betadine and draped in a sterile fashion.  An arterial line catheter was then inserted, over a needle, into the vessel.  The transducer set was then attached and securely fastened to the skin with sutures.   The patient had good distal perfusion after the procedure. The site was then dressed in a sterile fashion.    The patient tolerated the procedure well.     Complications: bleeding and a failed attempt to insert the line on the left radial artery also resulting in bleeding that was hemostatic with pressure.    Richardean Sale, MD, PGY-1

## 2022-03-07 NOTE — Progress Notes (Signed)
Brandy [name]  Oct 13, 1955  [id number]    Consulting Therapist: Harlow Ohms Koury Roddy       Subjective:   Pt unable to speak. Per chart, pt does not take any respiratory medications and she does not smoke. Unknown if she wears home O2/CPAP.    Objective:  SpO2 99% on NRB, so placed on oxymask 3L SpO2 93%    B/S clear t/o but coarse through glottic area  Pt is tachypneic.    CXRAY 09/26: Bibasilar atelectasis with mild central vascular congestion. No asymmetric consolidations or mass.    ABG: 7.47, 19.7, 103, 14.5 on NRB    Assessment:  Hypoxia on admission.    Plan:  Titrate O2 to maintain SpO2 at least 90%.   Reassess respiratory as needed.

## 2022-03-07 NOTE — Other (Addendum)
*This SWer was asked if pt's husband, Brandy [name], could sit in my office while waiting on medical care being performed on his wife (pt); husband is very hard of hearing-deaf- and requires people to speak loudly when speaking which was challenging for staff to do while team was providing care for pt:    ACTION TAKEN:  Husband was speaking to several nurses outside of room 12; I walked up and introduced myself at which time the nurses told the husband that this is the SWer we were talking about.    I took a moment and offered for him to come to my office to sit in a quieter place to wait for the Dr to come and update him; he was open to doing so. On the way we got him a cup of ice water. On the way he shared that he had been out in the lobby calling their 4 adult sons to update them on this scenario.    While in my office we visited about his marriage of 36 + years, how they met, and what a strong relationship they have. Husband, Brandy [name], shared many stories and life experiences that he and pt have had over the years. Husband shared poems he has written and memorized in addition to telling jokes and informing this SWer of his upbringing, the state of the world, and his adult children's current live situation. This man takes a lot of pride in his kids and his strong marriage with his wife.    While in the office, Dr. Leavy [name] came in and explained what procedures they were doing and also the pt's status and potential progress and outcome possibilities. The husband appeared calm and said he was aware that the pt is very ill. He shared that his wife last seemed well on Friday night as she fed herself, but as of Saturday a.m., she became more weak and she wasn't able to talk like she does. He shared that the pt was going to be starting the chemo txs every 2 weeks as of Oct 5th. He also reported that she was complaining of her bones hurting her.    He said that he wanted all measures done as he wants to keep my wife with me!  Dr. Leavy [name] did a wonderfully sensitive job of explaining the pt's level of infection and her body's current capability of fighting this. She also explained the current plan of care realistically and the potential for coming to a point where a discussion will need to occur to discuss next steps and a plan of care; the husband appeared to understand. He did say that he won't cry here -  I will when I get home.    The husband stayed and we continued to visit until he was able to go see the pt just prior to her being transported to the ICU. He and I went to see his wife and he stood at bedside for a bit and rubbed her arm and leg talking about how hard she has fought and all of the treatment her body has been through.    I then walked the husband up to the 3rd floor and showed him where the ICU is and how to use the speaker/button to go in and see her. During this time he shared that he felt he needed to get some fresh air and he wanted to go take a chew, like a cowboy does!! [he smiled and winked].    I encouraged him to  get something to eat as I asked him when he last ate and he said, Last nite I had 2 muffins. I reminded him how important it is to take care of himself during this time of emotional stress and he agreed; he said he had his dog in his car and he will probably go over to [employer].    I thanked him for allowing me to sit w/ him and visit; he hugged this SWer and thanked me for listening. I understood that he would be coming back this evening to be w/ his wife.    PLAN: Pt was admitted to ICU.    1) This SWer provided my business card to the husband    2) I will call the husband within the next few days to a week to check in.     3) This SWer showed the husband where the 3rd floor ICU unit is along w/ informing him of what room his wife is going to be in: 311    4) I walked the husband from the ICU to the elevators and explained how to get to the parking garage as he said he was confused on how to  get out of the hospital.

## 2022-03-08 LAB — BASIC METABOLIC PANEL
Anion Gap: 16.1 mmol/L (ref 9.0–18.0)
Anion Gap: 17.5 mmol/L (ref 9.0–18.0)
Anion Gap: 21.7 mmol/L — ABNORMAL HIGH (ref 9.0–18.0)
BUN / Creatinine Ratio: 15.4 (ref 12.0–20.0)
BUN / Creatinine Ratio: 18.2 (ref 12.0–20.0)
BUN / Creatinine Ratio: 21.6 — ABNORMAL HIGH (ref 12.0–20.0)
BUN: 63 mg/dL — ABNORMAL HIGH (ref 8–20)
BUN: 70 mg/dL — ABNORMAL HIGH (ref 8–20)
BUN: 78 mg/dL — ABNORMAL HIGH (ref 8–20)
CO2 - Carbon Dioxide: 13 mmol/L — ABNORMAL LOW (ref 17–27)
CO2 - Carbon Dioxide: 15 mmol/L — ABNORMAL LOW (ref 17–27)
CO2 - Carbon Dioxide: 16 mmol/L — ABNORMAL LOW (ref 17–27)
Calcium: 6.9 mg/dL — ABNORMAL LOW (ref 8.6–10.6)
Calcium: 6.9 mg/dL — ABNORMAL LOW (ref 8.6–10.6)
Calcium: 7 mg/dL — ABNORMAL LOW (ref 8.6–10.6)
Chloride: 100.9 mmol/L — ABNORMAL LOW (ref 101.0–111.0)
Chloride: 103.3 mmol/L (ref 101.0–111.0)
Chloride: 105.5 mmol/L (ref 101.0–111.0)
Creatinine: 3.61 mg/dL — ABNORMAL HIGH (ref 0.60–1.30)
Creatinine: 3.85 mg/dL — ABNORMAL HIGH (ref 0.60–1.30)
Creatinine: 4.09 mg/dL — ABNORMAL HIGH (ref 0.60–1.30)
Glomerular Filtration Rate Estimate (Female): 11 mL/min/{1.73_m2} — ABNORMAL LOW (ref >=60)
Glomerular Filtration Rate Estimate (Female): 12 mL/min/{1.73_m2} — ABNORMAL LOW (ref >=60)
Glomerular Filtration Rate Estimate (Female): 13 mL/min/{1.73_m2} — ABNORMAL LOW (ref >=60)
Glucose: 121 mg/dL — ABNORMAL HIGH (ref 74–106)
Glucose: 150 mg/dL — ABNORMAL HIGH (ref 74–106)
Glucose: 192 mg/dL — ABNORMAL HIGH (ref 74–106)
Osmolality Calculation: 292 mOsm/kg (ref 275.0–300.0)
Osmolality Calculation: 296.5 mOsm/kg (ref 275.0–300.0)
Osmolality Calculation: 300.3 mOsm/kg — ABNORMAL HIGH (ref 275.0–300.0)
Potassium: 3.56 mmol/L (ref 3.50–5.10)
Potassium: 3.95 mmol/L (ref 3.50–5.10)
Potassium: 4.01 mmol/L (ref 3.50–5.10)
Sodium: 133 mmol/L — ABNORMAL LOW (ref 135–145)
Sodium: 138 mmol/L (ref 135–145)
Sodium: 138 mmol/L (ref 135–145)

## 2022-03-08 LAB — CELLAVISION DIFFERENTIAL
Bands %: 1 % (ref 0–2)
Bands, Absolute: 0.1 10*3/ÂµL
Basophils %: 1 % (ref 0–2)
Basophils %: 0 % (ref 0–2)
Basophils %: 0 % (ref 0–2)
Basophils, Absolute: 0.1 10*3/ÂµL (ref 0.0–0.1)
Basophils, Absolute: 0 10*3/ÂµL (ref 0.0–0.1)
Basophils, Absolute: 0 10*3/ÂµL (ref 0.0–0.1)
Eosinophils %: 0 % (ref 0–5)
Eosinophils %: 0 % (ref 0–5)
Eosinophils %: 0 % (ref 0–5)
Eosinophils, Absolute: 0 10*3/ÂµL (ref 0.0–0.4)
Eosinophils, Absolute: 0 10*3/ÂµL (ref 0.0–0.4)
Eosinophils, Absolute: 0 10*3/ÂµL (ref 0.0–0.4)
Lymphocytes %: 4 % — ABNORMAL LOW (ref 20–40)
Lymphocytes %: 6 % — ABNORMAL LOW (ref 20–40)
Lymphocytes %: 5 % — ABNORMAL LOW (ref 20–40)
Lymphocytes, Absolute: 0.4 10*3/ÂµL — ABNORMAL LOW (ref 1.0–4.0)
Lymphocytes, Absolute: 0.5 10*3/ÂµL — ABNORMAL LOW (ref 1.0–4.0)
Lymphocytes, Absolute: 0.3 10*3/ÂµL — ABNORMAL LOW (ref 1.0–4.0)
Metamyelocytes %: 3 % — ABNORMAL HIGH
Metamyelocytes, Absolute: 0.3 10*3/ÂµL — ABNORMAL HIGH
Monocytes %: 1 % — ABNORMAL LOW (ref 2–12)
Monocytes %: 1 % — ABNORMAL LOW (ref 2–12)
Monocytes %: 0 % — ABNORMAL LOW (ref 2–12)
Monocytes, Absolute: 0.1 10*3/ÂµL — ABNORMAL LOW (ref 0.2–1.0)
Monocytes, Absolute: 0.1 10*3/ÂµL — ABNORMAL LOW (ref 0.2–1.0)
Monocytes, Absolute: 0 10*3/ÂµL — ABNORMAL LOW (ref 0.2–1.0)
Neutrophils %: 91 % — ABNORMAL HIGH (ref 50–74)
Neutrophils %: 93 % — ABNORMAL HIGH (ref 50–74)
Neutrophils %: 95 % — ABNORMAL HIGH (ref 50–74)
Neutrophils, Absolute: 9.5 10*3/ÂµL — ABNORMAL HIGH (ref 2.0–7.4)
Neutrophils, Absolute: 7.8 10*3/ÂµL — ABNORMAL HIGH (ref 2.0–7.4)
Neutrophils, Absolute: 6 10*3/ÂµL (ref 2.0–7.4)
Platelet Estimate: DECREASED — AB
Platelet Estimate: DECREASED — AB
Platelet Estimate: DECREASED — AB
Total Cells Counted: 100
Total Cells Counted: 100
Total Cells Counted: 100
WBC: 10.5 10*3/ÂµL (ref 4.5–11.0)
WBC: 8.4 10*3/ÂµL (ref 4.5–11.0)
WBC: 6.3 10*3/ÂµL (ref 4.5–11.0)
nRBC: 2 /100 — ABNORMAL HIGH

## 2022-03-08 LAB — CBC WITH AUTO DIFFERENTIAL
HCT: 21.8 % — ABNORMAL LOW (ref 35.0–48.0)
HCT: 21.3 % — ABNORMAL LOW (ref 35.0–48.0)
HCT: 20.6 % — ABNORMAL LOW (ref 35.0–48.0)
Hemoglobin: 7.3 g/dL — ABNORMAL LOW (ref 11.7–16.5)
Hemoglobin: 7.2 g/dL — ABNORMAL LOW (ref 11.7–16.5)
Hemoglobin: 6.9 g/dL — ABNORMAL LOW (ref 11.7–16.5)
MCH: 30.9 pg (ref 28.3–33.3)
MCH: 31.4 pg (ref 28.3–33.3)
MCH: 31 pg (ref 28.3–33.3)
MCHC: 33.3 g/dL (ref 32.5–36.0)
MCHC: 33.7 g/dL (ref 32.5–36.0)
MCHC: 33.7 g/dL (ref 32.5–36.0)
MCV: 92.8 fL (ref 81.0–100.0)
MCV: 93.1 fL (ref 81.0–100.0)
MCV: 92 fL (ref 81.0–100.0)
MPV: 8.9 fL (ref 6.9–10.0)
MPV: 9.3 fL (ref 6.9–10.0)
MPV: 9.5 fL (ref 6.9–10.0)
Platelet Count: 47 10*3/ÂµL — ABNORMAL LOW (ref 150–405)
Platelet Count: 40 10*3/ÂµL — ABNORMAL LOW (ref 150–405)
Platelet Count: 42 10*3/ÂµL — ABNORMAL LOW (ref 150–405)
RBC: 2.35 10*6/ÂµL — ABNORMAL LOW (ref 3.80–5.60)
RBC: 2.28 10*6/ÂµL — ABNORMAL LOW (ref 3.80–5.60)
RBC: 2.24 10*6/ÂµL — ABNORMAL LOW (ref 3.80–5.60)
RDW: 19.7 % — ABNORMAL HIGH (ref 11.7–16.1)
RDW: 20 % — ABNORMAL HIGH (ref 11.7–16.1)
RDW: 19.4 % — ABNORMAL HIGH (ref 11.7–16.1)
WBC: 10.5 10*3/ÂµL (ref 4.5–11.0)
WBC: 8.4 10*3/ÂµL (ref 4.5–11.0)
WBC: 6.3 10*3/ÂµL (ref 4.5–11.0)

## 2022-03-08 LAB — COMPREHENSIVE METABOLIC PANEL
ALT - Alanine Aminotransferase: 64 IU/L — ABNORMAL HIGH (ref 5–33)
AST - Aspartate Aminotransferase: 238 IU/L — ABNORMAL HIGH (ref 5–32)
Albumin/Globulin Ratio: 0.9 — ABNORMAL LOW (ref 1.2–2.2)
Albumin: 2.6 g/dL — ABNORMAL LOW (ref 3.5–5.0)
Alkaline Phosphatase: 82 IU/L (ref 35–105)
Anion Gap: 17.8 mmol/L (ref 9.0–18.0)
BUN / Creatinine Ratio: 17.9 (ref 12.0–20.0)
BUN: 71 mg/dL — ABNORMAL HIGH (ref 8–20)
Bilirubin Total: 0.9 mg/dL (ref 0.10–1.70)
CO2 - Carbon Dioxide: 15 mmol/L — ABNORMAL LOW (ref 17–27)
Calcium: 7.1 mg/dL — ABNORMAL LOW (ref 8.6–10.6)
Chloride: 106.2 mmol/L (ref 101.0–111.0)
Creatinine: 3.96 mg/dL — ABNORMAL HIGH (ref 0.60–1.30)
Glomerular Filtration Rate Estimate (Female): 12 mL/min/{1.73_m2} — ABNORMAL LOW (ref >=60)
Glucose: 163 mg/dL — ABNORMAL HIGH (ref 74–106)
Osmolality Calculation: 302 mOsm/kg — ABNORMAL HIGH (ref 275.0–300.0)
Potassium: 4.11 mmol/L (ref 3.50–5.10)
Protein Total: 5.5 g/dL — ABNORMAL LOW (ref 6.4–8.2)
Sodium: 139 mmol/L (ref 135–145)

## 2022-03-08 LAB — ABO/RH (HCLL): Rh (D): POSITIVE

## 2022-03-08 LAB — HEMOGLOBIN AND HEMATOCRIT, BLOOD
HCT: 22.3 % — ABNORMAL LOW (ref 35.0–48.0)
Hemoglobin: 7.5 g/dL — ABNORMAL LOW (ref 11.7–16.5)

## 2022-03-08 LAB — CK
CK (Creatine Kinase): 5170 IU/L — ABNORMAL HIGH (ref 26–192)
CK (Creatine Kinase): 5722 IU/L — ABNORMAL HIGH (ref 26–192)

## 2022-03-08 LAB — ANTIBODY SCREEN (HCLL): Antibody Screen: NEGATIVE

## 2022-03-08 LAB — EKG WAVE FORM MONITORING IMAGE

## 2022-03-08 MED ORDER — acetaminophen (TYLENOL) suppository 650 mg
650 | Freq: Four times a day (QID) | RECTAL | Status: DC | PRN
Start: 2022-03-08 — End: 2022-03-09

## 2022-03-08 MED ORDER — albuterol (PROVENTIL) nebulizer solution 2.5 mg
2.5 | RESPIRATORY_TRACT | Status: DC | PRN
Start: 2022-03-08 — End: 2022-03-22
  Administered 2022-03-08: 18:00:00 2.5 mg via RESPIRATORY_TRACT

## 2022-03-08 MED ORDER — lactated Ringer's bolus 1,000 mL
Freq: Once | INTRAVENOUS | Status: AC
Start: 2022-03-08 — End: 2022-03-08
  Administered 2022-03-08 (×2): via INTRAVENOUS

## 2022-03-08 MED ORDER — polyvinyl alcohol-povidone(PF) (REFRESH CLASSIC) opthalmic solution 1 drop
1.4-0.6 | Freq: Four times a day (QID) | OPHTHALMIC | Status: DC
Start: 2022-03-08 — End: 2022-03-22
  Administered 2022-03-09 – 2022-03-22 (×49): via OPHTHALMIC

## 2022-03-08 MED ORDER — nystatin (MYCOSTATIN) powder
100000 | Freq: Two times a day (BID) | TOPICAL | Status: DC
Start: 2022-03-08 — End: 2022-03-22
  Administered 2022-03-08 – 2022-03-22 (×28): via TOPICAL

## 2022-03-08 MED ORDER — sodium chloride 0.9 % (NS) for medication or blood product administration 250 mL
0.9 | INTRAVENOUS | Status: DC | PRN
Start: 2022-03-08 — End: 2022-03-18

## 2022-03-08 NOTE — Plan of Care (Signed)
Problem: Safety  Goal: Patient will be injury free during hospitalization  Description: Assess and monitor vitals signs, neurological status including level of consciousness and orientation. Assess patient's risk for falls and implement fall prevention plan of care and interventions per hospital policy.      Ensure arm band on, uncluttered walking paths in room, adequate room lighting, call light and overbed table within reach, bed in low position, wheels locked, side rails up per policy, and non-skid footwear provided.   Outcome: Progressing     Problem: Knowledge Deficit  Goal: Patient/family/caregiver demonstrates understanding of disease process, treatment plan, medications, and discharge instructions  Description: Complete learning assessment and assess knowledge base.  Outcome: Progressing     Problem: Risk for Falls  Goal: No falls during hospitalization  Description: Patient will not fall during hospitalization.  Outcome: Progressing

## 2022-03-08 NOTE — Progress Notes (Signed)
ICU Progress Note  [facility]   Name: Brandy [name]  DOB: 06/09/1956 66 y.o.  MRN: [id number]  CSN: [id number]  MWN:UUVOZ Mountz, DO    Subjective:     Brandy [name] is a 67 y.o. 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    According to husband, she became weak one day prior to admission and was non-responsive the morning of admission. Found to be in multisystem failure in ED with hypotension, tachycardia, and hypoxia.     Her level of consciousness and overall condition is improving, but is still quite serious.    REVIEW OF SYSTEMS:  Pertinent positives are covered above, all others negative.    Objective:     Vitals Current 24 Hour Min / Max      Temp    37.2 ?C (99 ?F)    Temp  Min: 35.8 ?C (96.4 ?F)  Max: 40.5 ?C (104.9 ?F)      BP     (!) 89/40     BP  Min: 70/47  Max: 126/93      HR    (!) 118    Pulse  Min: 105  Max: 142      RR    (!) 25    Resp  Min: 15  Max: 44      Sats      SpO2: 93 % on  L/min       SpO2  Min: 89 %  Max: 100 %      Weight    111.5 kg (245 lb 12.8 oz)  Body mass index is 43.54 kg/m?Marland Kitchen    Admit: 103.4 kg (228 lb)   Intake/Ouput:    Intake/Output Summary (Last 24 hours) at 03/08/2022 1016  Last data filed at 03/08/2022 0900  Gross per 24 hour   Intake 6652 ml   Output 427 ml   Net 6225 ml      LDAs:   Triple lumen right IJ CVC  Foley  pIV x2  NG tube  Right radial arterial line    Scheduled Medications:  . acetaminophen (TYLENOL) suppository  650 mg Rectal Q6H   . ceFEPime (MAXIPIME) IV  1 g Intravenous Q12H   . hydrocortisone sod succ (PF)  100 mg Intravenous Q12H   . nystatin   Topical 2 times per day   . pantoprazole  40 mg Intravenous Daily   .  [START ON 03/10/2022] vancomycin  1,500 mg Intravenous Q72H       Infusions:   . lactated Ringer's 150 mL/hr at 03/08/22 0424   . norepinephrine Stopped (03/08/22 0853)   . vasopressin (VASOSTRICT) 40 units in sodium chloride 0.9 % (NS) 250 mL (0.16 units/mL) infusion       PRN Medications: acetaminophen, calcium carbonate, HYDROmorphone, lidocaine, LORazepam, magnesium hydroxide, naloxone (NARCAN) injection 0.04 mg, naloxone, norepinephrine, ondansetron **OR** ondansetron, polyethylene glycol, senna-docusate, sulfur hexafluoride    Code Status: DNR    PHYSICAL EXAMINATION:    Gen: NAD, pleasant, responsive to questions  HENT: PERRL, oropharynx clear  CV: RRR -m/r/g  Pulm: CTAB -w/c, exam limited to anterior and lateral lung fields  Abd: NT/ND NA BS  Ext: pale, but cap refill <2 sec, no edema bilaterally  Recent Labs:   Results from last 7 days   Lab Units 03/08/22  0641 03/08/22  0000 03/07/22  1707 03/07/22  1103   WBC 10*3/?L 8.4  8.4 10.5  10.5  --  4.9  4.9   HGB g/dL 7.2* 7.3* 7.5* 9.1*   MCV fL 93.1 92.8  --  92.2   NEUTROS PCT % 93* 91*  --  91*   PLATELETS 10*3/?L 40* 47*  --  68*     Results from last 7 days   Lab Units 03/08/22  0641 03/08/22  0000 03/07/22  1707 03/07/22  1103   SODIUM mmol/L 139 133* 138 137   POTASSIUM mmol/L 4.11 3.95 3.56 3.74   CHLORIDE mmol/L 106.2 100.9* 103.3 100.8*   CO2 mmol/L 15* 16* 13* 17   GLUCOSE mg/dL 161* 096* 045* 409*   BUN mg/dL 71* 70* 63* 65*   CREATININESERUM mg/dL 8.11* 9.14* 7.82* 9.56*   CALCIUM mg/dL 7.1* 6.9* 7.0* 8.1*   MAGNESIUM mg/dL  --   --   --  1.6   PHOSPHORUS mg/dL  --   --   --  4.1     Results from last 7 days   Lab Units 03/08/22  0641 03/07/22  1103   AST IU/L 238* 237*   ALT IU/L 64* 74*   ALK PHOS IU/L 82 61   PROTEIN TOTAL g/dL 5.5* 7.0   ALBUMIN g/dL 2.6* 3.4*   A/G RATIO  0.9* 0.9*             Results from last 7 days   Lab Units 03/07/22  1103   INR  1.3*   PROTIME Seconds 14.4*         Recent Imaging:  CT chest abdomen pelvis without  IV contrast :    Result Date: 03/07/2022  IMPRESSION: Collapse of portions of the mid to lower lungs. Superimposed pneumonia is possible in the appropriate clinical setting. ========================= IMPRESSION: 1.   There is a 3 mm calculus in the urinary bladder. Given the mild left hydronephrosis, this may have recently passed into the urinary bladder from the left ureter. 2.   Urinary bladder is catheterized. Please exclude infection clinically. 3.   Hepatic steatosis.     CT head without contrast    Result Date: 03/07/2022  IMPRESSION: Tiny subarachnoid hemorrhage near the vertex on the right is poorly visualized compared to prior exam. No new hemorrhage. No mass effect or midline shift.     Ultrasound right upper quadrant    Result Date: 03/07/2022  IMPRESSION: 1. Diffuse increased echogenicity of the liver. Differential diagnosis includes, but is not limited to, fatty infiltration, hepatitis, and cirrhosis. 2. Cholelithiasis with small floating stones in the gallbladder lumen, as well as echogenic sludge.     Ultrasound kidney    Result Date: 03/07/2022  IMPRESSION: 1. No solid mass or hydronephrosis seen.     Echo 2D complete w doppler wo contrast    Result Date: 03/07/2022   IMPRESSION ---------------------------------------- 1. Left ventricular cavity size is normal. There is mild concentric left ventricular hypertrophy. Left ventricular EF 40 - 45 %. Unable to assess diastolic function in the setting of tachycardia. 2. Right ventricle is mildly dilated with decreased systolic function. 3. The sinuses of Valsalva appears dilated, measuring 3.4 cm. The ascending aorta appears dilated, measuring 3.8 cm. 4. 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 It is well seated and does not rock. Transvalvular gradients are within expected limits  for the valve. 5. The mitral valve has mild regurgitation. 6. The tricuspid valve appears structurally normal. The tricuspid valve has trace/physiological  regurgitation. The RSVP is estimated at 27-32 mmHg, which is normal. 7. Pulmonic valve appears structurally normal. The pulmonic valve has trace regurgitation. 8. No evidence of vegetation. 9. No prior study available for comparison.     X-ray Chest 1 View    Result Date: 03/07/2022  EXAMINATION: XR CHEST 1 VIEW HISTORY: AHRF. COMPARISON: 12/01/2021 FINDINGS: LINES/TUBES: Right-sided Port-A-Cath in good position.  Right IJ catheter with tip in the right atrium. LUNGS: Bibasilar atelectasis with mild central vascular congestion.  No asymmetric consolidations or mass. CARDIAC: Mild cardiomegaly. MEDIASTINUM: No superior mediastinal widening. Post median sternotomy changes with aortic valve replacement noted. PLEURA: No evidence of pleural effusion or pneumothorax. OTHER: Negative.     IMPRESSION: 1. See Above.     CT head without contrast    Result Date: 03/07/2022  IMPRESSION: -Trace focal subarachnoid hemorrhage noted in a sulcus overlying the right superior parietal lobe, source unclear.  Recommend clinical correlation and further evaluation with CT angiogram of the head and/or contrast-enhanced MRI of the brain, as appropriate. -No hydrocephalus or mass effect. -Nonspecific subcentimeter hypodense focus in the left posterior cerebellar hemisphere, possibly chronic infarct. -Chronic age-related changes. Dictated by: Rogene Houston Electronically Signed by: Rogene Houston on 03/07/2022 12:25 PM Report ID#: [id number]       Assessment & Plan:   ASSESSMENT:  Patient Active Problem List    Diagnosis   . Subarachnoid hemorrhage (HCC)   . Acute encephalopathy   . Septic shock (HCC)   . Acute pyelonephritis   . Acute renal failure, unspecified acute renal failure type (HCC)   . SAH (subarachnoid hemorrhage) (HCC)   . Encephalopathy acute   . History of amputation of right great toe (HCC)   . Menopausal syndrome on hormone replacement therapy   . Long term (current) use of aromatase inhibitors   . HER2-positive carcinoma of breast  (HCC)   . Thickened endometrium   . Malignant neoplasm of hepatic flexure (HCC)   . Axillary adenopathy   . 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 Anaphylaxis   . Back pain, thoracic   . Hyperparathyroidism (HCC)   . Colon cancer (HCC)   . Aortic valve disorder   . Heparin induced thrombocytopenia (HCC)   . Non-ischemic cardiomyopathy (HCC)   . Demand ischemia (HCC)   . Obesity   . Iron deficiency anemia   . Hilar lymphadenopathy- bilataeral    . Liver lesion- small hypodense lesion around falciform lig         CARDIOVASCULAR:   #Shock, presumed septic   POA. Severe and refractory. Source still differentiating - query UTI or pneumonia currently. Appropriately fluid resuscitated. S/p stress dose steroids.  - Levophed PRN for MAP 65  - LR increased to 200 ml/hr   ?  #Severe Aortic Stenosis s/p Bioprosthetic Aortic Valve - 2017  POA. Complicates care if bacteremic. STAT ECHO shows normally functioning prosthetic valve with no signs of vegetation.  - repeat ECHO 10/01  ?  #HFrEF   POA. Does not appear in exacerbation, appears to have improved following valve replacement in 2017. STAT ECHO shows EF 40-45%.  - Holding carvedilol, atorvastatin, ASA while acutely ill     PULM:   #Acute Hypoxic Respiratory Failure   POA. No baseline O2 use. No known lung disease. Needing 3L oximask  on admission. Now on CPAP with FiO2 of 30. On room airS/p NG placed to decompress stomach after bagging   - RTDP  - Bipap if needed  ?  #Respiratory Alkalosis  POA. Tachypneic due to sepsis/hyperthermia. Improving.    ID:   #UTI - s/p ceftriaxone, now on cefepime  #MSSA bacteremia of unclear source  - Unclear source of sepsis. Hx of pan-sensitive E. Coli UTI. Urine culture shows coliform bacteria. Will continue broad spectrum abx.  - cefepime 9/26-  - vancomycin 9/29-  - bacteremic, will need port removed    ENDO:   #Hyperparathyroidism s/p parathyroidectomy 2019  Complicates care.  Monitor electrolytes.     RENAL:   #Acute Renal Failure   POA. Cr normal at baseline, 4.21 on admission. Renal US showed no solid mass or hydronephrosis, though CT showed calculus in bladder that may have recently passed.  - Fluid resuscitation at above  - Foley, strict I/O   - Pharmacy consulted to review medications   ?  #Anion Gap Metabolic Acidosis  POA. Likely from shock and renal failure. Lactate normal.     GI/FEN:   #Acute Hepatitis   POA. Likely shock liver. RUQ US shows diffuse increased echogenicity of the liver which could indicate fatty infiltration, hepatitis, or cirrhosis.  - Trend LFTs    NEURO:   #Acute Metabolic Encephalopathy   POA. Likely due to severe sepsis, GCS 3. Treating the underlying causes. Intubation discussed at length with the husband, decided DNR/DNI. Unclear prognosis, posturing on exam, may have hypoxic brain injury from prolonged time of critical illness at home. GCS improved to >8 overnight. Now moving spontaneously, talking, and able to answer questions.  - Dilaudid PRN for pain   ?  #Trace Non-Aneurysmal Subarachnoid Hemorrhage  POA. Seen on CT head on admission. Discussed with Neurosurgery by ED physician, gave no recommendations for management.   - Seizure precautions   - Avoid anticoagulation for 4 weeks since [facility].  - Now is likely stable enough to undergo brain MRI, but will consider when kidney function improved, to evaluate for metastases or other causes of encephalopathy or SAH.  ?  #Hyperthermia (resolved)  POA. Temperature initially nearaly 105F. Temperature has since normalized  - APAP suppository PRN, switch back to scheduled if temperature consistently high  - Fans, limit blankets     SKIN/MSK:   #Rhabdomyolysis   POA. Mild. McMahon Score: high risk.  CK increasing.  - Fluid resuscitation and renal protection measures described above    - IVF 200 ml/hr  ?  #Pressure Injuries, Ecchymosis  POA. Multiple and scattered across back.   - Wound care consultation  - Frequent  position changes     HEME/ONC:   #Coagulopathy of Sepsis, Thrombocytopenia  POA. INR elevated. Hgb dropped to 6.9 on 9/27. Transfused 1 unit pRBCs.  - Monitor for signs of bleeding, DIC labs if seen  - Avoid heparin    ?  #Iron deficiency anemia + chemotherapy-induced anemia  POA. Required blood transfusions in the last month.   - Continue to trend H&H  ?  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 Following with Dr. Nadeen Landau at [facility], currently on Docetaxel/Trastuzumab. Metastatic to lymph node. Last treatment 02/09/2022. Required blood transfusions due to chemotherapy.   ?  #Colon cancer s/p colectomy 2018  Complicates care, bowel regimen.     Feeding: NPO  Analgesia: Dilaudid PRN  Sedation: None  Thromboprophylaxis: None - subarachnoid hemorrhage   Head of bed: 30  degrees  Ulcer prophylaxis: PPI  Glucose: 5.5% (07/27/2020); 6.0% (01/26/2016); 5.2% (01/21/2016)   Bowel regimen: PRN  I - Lines/Drains/Airway: CVC R IJ 9/26, R radial arterial line 9/23, NG tube 9/26-9/27, Foley 9/26  Deescalate antibiotics - pending culture results and source identification    CODE: DNR/DNI per multiple conversations with patient's husband    Verbal MDPOA: Husband, [name]  DISPO: ICU, guarded improvement    Social: Lives with husband, Jonny Ruiz, who is MDPOA        Jhonny Calixto   03/08/2022  10:16 AM

## 2022-03-08 NOTE — Consults (Signed)
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    Wound Care consulted/following for evaluation and care of wounds on back and buttocks.    Today: Patient awake and alert, nods head appropriately to questions, follows commands as able but is extremely weak. Left heel Deep Tissue Pressure Injury noted. Patient has numerous ecchymoses all over her body. Moisture injury of the skin with yeast component under the abdominal pannus, within the bilateral inguinal folds and also within the gluteal cleft. Nystatin powder ordered, add SkinFold Dry Sheets. Right dorsal foot area of non-blanching purple discoloration which may represent a pressure injury or ecchymosis. Husband at bedside.     O: Blood pressure (!) 89/40, pulse (!) 114, temperature 37.7 ?C (99.9 ?F), temperature source Foley, resp. rate (!) 31, weight 245 lb 12.8 oz (111.5 kg), SpO2 92 %, not currently breastfeeding.    See assessment flowsheet for full wound details.     Photos- 03/08/2022      Left heel      Gluteal cleft    Braden Scale score - 13, moderate risk    A: Left heel Deep Tissue Pressure Injury, present on admission.  Gluteal cleft Irritant Contact Dermatitis with linear partial thickness tissue loss 2/2 incontinence of stool and presence of yeast.  Abdominal pannus and bilateral inguinal fold Irritant Contact Dermatitis with linear partial thickness tissue loss 2/2 excess skin moisture and presence of yeast.  Numerous ecchymoses on the back, buttocks and extremities.    P: Local wound and skin care, moisture barrier, antifungal powder, pressure offloading.    Intertrigo -    Cleanse affected areas with bath cloths and dry well.   Apply Nystatin powder to skin folds and rub in well, Q 12 hours. Tuck Skinfold dry sheets into skin folds.  Apply Clear Aid moisture barrier ointment to gluteal cleft and perineum twice daily and PRN.  Apply Allevyn sacral silicone dressing to sacral area for protection/prevention.    Left heel -   Cleanse with wound cleanser spray and pat dry.  Wipe periwound skin with skin barrier/prep wipe and allow to dry.  Apply Allevyn silicone bordered foam dressing and change Q 3 days and PRN.    Provide pressure offloading measures; recommend Waffle air mattress overlay, turn/reposition Q 2 hours and PRN using pillows/wedges to offload the sacrum, float heels off the bed surface using pillows or boots, pad bony prominences.     Will follow.    Thank you.    K. Dareen Piano, BSN, RN, [employer]  [phone]

## 2022-03-08 NOTE — Plan of Care (Signed)
Patient slowly becoming more alert/aware throughout the shift. Patient now following all commands and nodding yes/no to questions. Patient has CVC, foley, art line and NG tube. Levophed titrated as able. See MAR for current rate. Patient repositioned frequently. Low urine output. MD aware. Call light within reach.     Problem: Knowledge Deficit  Goal: Patient/family/caregiver demonstrates understanding of disease process, treatment plan, medications, and discharge instructions  Description: Complete learning assessment and assess knowledge base.  Outcome: Progressing     Problem: Risk for Falls  Goal: No falls during hospitalization  Description: Patient will not fall during hospitalization.  Outcome: Progressing

## 2022-03-09 LAB — COMPREHENSIVE METABOLIC PANEL
ALT - Alanine Aminotransferase: 55 IU/L — ABNORMAL HIGH (ref 5–33)
AST - Aspartate Aminotransferase: 180 IU/L — ABNORMAL HIGH (ref 5–32)
Albumin/Globulin Ratio: 0.8 — ABNORMAL LOW (ref 1.2–2.2)
Albumin: 2.3 g/dL — ABNORMAL LOW (ref 3.5–5.0)
Alkaline Phosphatase: 73 IU/L (ref 35–105)
Anion Gap: 15.9 mmol/L (ref 9.0–18.0)
BUN / Creatinine Ratio: 22.3 — ABNORMAL HIGH (ref 12.0–20.0)
BUN: 78 mg/dL — ABNORMAL HIGH (ref 8–20)
Bilirubin Total: 0.7 mg/dL (ref 0.10–1.70)
CO2 - Carbon Dioxide: 16 mmol/L — ABNORMAL LOW (ref 17–27)
Calcium: 6.9 mg/dL — ABNORMAL LOW (ref 8.6–10.6)
Chloride: 109.1 mmol/L (ref 101.0–111.0)
Creatinine: 3.5 mg/dL — ABNORMAL HIGH (ref 0.60–1.30)
Glomerular Filtration Rate Estimate (Female): 14 mL/min/{1.73_m2} — ABNORMAL LOW (ref >=60)
Glucose: 116 mg/dL — ABNORMAL HIGH (ref 74–106)
Osmolality Calculation: 305.6 mOsm/kg — ABNORMAL HIGH (ref 275.0–300.0)
Potassium: 4.04 mmol/L (ref 3.50–5.10)
Protein Total: 5.3 g/dL — ABNORMAL LOW (ref 6.4–8.2)
Sodium: 141 mmol/L (ref 135–145)

## 2022-03-09 LAB — CBC WITH AUTO DIFFERENTIAL
HCT: 21.3 % — ABNORMAL LOW (ref 35.0–48.0)
Hemoglobin: 7.2 g/dL — ABNORMAL LOW (ref 11.7–16.5)
MCH: 30.8 pg (ref 28.3–33.3)
MCHC: 33.7 g/dL (ref 32.5–36.0)
MCV: 91.2 fL (ref 81.0–100.0)
MPV: 10.4 fL — ABNORMAL HIGH (ref 6.9–10.0)
Platelet Count: 35 10*3/ÂµL — ABNORMAL LOW (ref 150–405)
RBC: 2.34 10*6/ÂµL — ABNORMAL LOW (ref 3.80–5.60)
RDW: 19.4 % — ABNORMAL HIGH (ref 11.7–16.1)
WBC: 4.4 10*3/ÂµL — ABNORMAL LOW (ref 4.5–11.0)

## 2022-03-09 LAB — BLOOD CULTURE

## 2022-03-09 LAB — CELLAVISION DIFFERENTIAL
Basophils %: 0 % (ref 0–2)
Basophils, Absolute: 0 10*3/ÂµL (ref 0.0–0.1)
Eosinophils %: 0 % (ref 0–5)
Eosinophils, Absolute: 0 10*3/ÂµL (ref 0.0–0.4)
Lymphocytes %: 3 % — ABNORMAL LOW (ref 20–40)
Lymphocytes, Absolute: 0.1 10*3/ÂµL — ABNORMAL LOW (ref 1.0–4.0)
Monocytes %: 1 % — ABNORMAL LOW (ref 2–12)
Monocytes, Absolute: 0 10*3/ÂµL — ABNORMAL LOW (ref 0.2–1.0)
Neutrophils %: 96 % — ABNORMAL HIGH (ref 50–74)
Neutrophils, Absolute: 4.2 10*3/ÂµL (ref 2.0–7.4)
Platelet Estimate: DECREASED — AB
Total Cells Counted: 100
WBC: 4.4 10*3/ÂµL — ABNORMAL LOW (ref 4.5–11.0)

## 2022-03-09 LAB — EKG WAVE FORM MONITORING IMAGE

## 2022-03-09 LAB — CK: CK (Creatine Kinase): 4628 IU/L — ABNORMAL HIGH (ref 26–192)

## 2022-03-09 MED ORDER — acetaminophen (TYLENOL) suppository 650 mg
650 | Freq: Four times a day (QID) | RECTAL | Status: DC
Start: 2022-03-09 — End: 2022-03-14
  Administered 2022-03-10 – 2022-03-14 (×18): 650 mg via RECTAL

## 2022-03-09 MED ORDER — cefTRIAXone (ROCEPHIN) 2 g in 50 mL SNAP IVPB
INTRAMUSCULAR | Status: DC
Start: 2022-03-09 — End: 2022-03-10
  Administered 2022-03-10 (×2): via INTRAVENOUS

## 2022-03-09 NOTE — Plan of Care (Deleted)
Patient remains alert and is able to nod yes and no to questions. Patient following commands. Patient continues to have art line, CVC and foley. Patient repositioned Q2 hours. Call light within reach.     Problem: Safety  Goal: Patient will be injury free during hospitalization  Description: Assess and monitor vitals signs, neurological status including level of consciousness and orientation. Assess patient's risk for falls and implement fall prevention plan of care and interventions per hospital policy.      Ensure arm band on, uncluttered walking paths in room, adequate room lighting, call light and overbed table within reach, bed in low position, wheels locked, side rails up per policy, and non-skid footwear provided.   Outcome: Progressing     Problem: Knowledge Deficit  Goal: Patient/family/caregiver demonstrates understanding of disease process, treatment plan, medications, and discharge instructions  Description: Complete learning assessment and assess knowledge base.  Outcome: Progressing     Problem: Risk for Falls  Goal: No falls during hospitalization  Description: Patient will not fall during hospitalization.  Outcome: Progressing

## 2022-03-09 NOTE — Progress Notes (Signed)
ICU Progress Note  [facility]   Name: Deanndra Cooprider  DOB: 09-02-1955 66 y.o.  MRN: [id number]  CSN: [id number]  [id number] Mountz, DO    Subjective:     Janecia Pura Kenter is a 66 y.o. 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    According to husband, she became weak one day prior to admission and was non-responsive the morning of admission. Found to be in multisystem failure in ED with hypotension, tachycardia, and hypoxia.     Her level of consciousness and overall condition is improving.    REVIEW OF SYSTEMS:  Pertinent positives are covered above, all others negative.    Objective:     Vitals Current 24 Hour Min / Max      Temp    (!) 38.1 ?C (100.6 ?F)    Temp  Min: 34 ?C (93.2 ?F)  Max: 38.1 ?C (100.6 ?F)      BP     130/79     BP  Min: 110/66  Max: 130/79      HR    (!) 117    Pulse  Min: 89  Max: 123      RR    (!) 26    Resp  Min: 14  Max: 30      Sats      SpO2: 93 % on  L/min       SpO2  Min: 91 %  Max: 100 %      Weight    116.5 kg (256 lb 14.4 oz)  Body mass index is 45.51 kg/m?Marland Kitchen    Admit: 103.4 kg (228 lb)   Intake/Ouput:    Intake/Output Summary (Last 24 hours) at 03/09/2022 1444  Last data filed at 03/09/2022 1102  Gross per 24 hour   Intake 4950 ml   Output 1220 ml   Net 3730 ml      LDAs:   Triple lumen right IJ CVC  Foley  pIV x2    Scheduled Medications:  . cefTRIAXone (ROCEPHIN) IV  2 g Intravenous Q24H   . nystatin   Topical 2 times per day   . pantoprazole  40 mg Intravenous Daily   . polyvinyl alcohol-povidone(PF)  1 drop Both Eyes 4x Daily while awake   . [START ON 03/10/2022] vancomycin  1,500 mg Intravenous Q72H       Infusions:   . lactated Ringer's 200  mL/hr at 03/09/22 1225   . norepinephrine Stopped (03/08/22 0853)     PRN Medications: acetaminophen (TYLENOL) suppository, acetaminophen, albuterol, calcium carbonate, HYDROmorphone, lidocaine, LORazepam, magnesium hydroxide, naloxone (NARCAN) injection 0.04 mg, naloxone, norepinephrine, ondansetron **OR** ondansetron, polyethylene glycol, senna-docusate, sodium chloride 0.9 %, sulfur hexafluoride    Code Status: DNR    PHYSICAL EXAMINATION:    Gen: NAD, pleasant, responsive to questions with nodding or shaking head  HENT: oropharynx clear  CV: RRR -m/r/g  Pulm: CTAB -w/c, exam limited to anterior and lateral lung fields  Abd: slightly tender to palpation of epigastric area/ND, normal BS  Ext: pale, but cap refill <2 sec      Recent Labs:   Results from last 7 days   Lab Units 03/09/22  0614 03/08/22  1512 03/08/22  0641 03/08/22  0000 03/07/22  1707 03/07/22  1103   WBC 10*3/?L 4.4*  4.4* 6.3  6.3 8.4  8.4 10.5  10.5  --  4.9  4.9   HGB g/dL 7.2* 6.9* 7.2* 7.3* 7.5* 9.1*   MCV fL 91.2 92.0 93.1 92.8  --  92.2   NEUTROS PCT % 96* 95* 93* 91*  --  91*   PLATELETS 10*3/?L 35* 42* 40* 47*  --  68*     Results from last 7 days   Lab Units 03/09/22  0614 03/08/22  1512 03/08/22  0641 03/08/22  0000 03/07/22  1707 03/07/22  1103   SODIUM mmol/L 141 138 139 133* 138 137   POTASSIUM mmol/L 4.04 4.01 4.11 3.95 3.56 3.74   CHLORIDE mmol/L 109.1 105.5 106.2 100.9* 103.3 100.8*   CO2 mmol/L 16* 15* 15* 16* 13* 17   GLUCOSE mg/dL 096* 045* 409* 811* 914* 142*   BUN mg/dL 78* 78* 71* 70* 63* 65*   CREATININESERUM mg/dL 7.82* 9.56* 2.13* 0.86* 4.09* 4.21*   CALCIUM mg/dL 6.9* 6.9* 7.1* 6.9* 7.0* 8.1*   MAGNESIUM mg/dL  --   --   --   --   --  1.6   PHOSPHORUS mg/dL  --   --   --   --   --  4.1     Results from last 7 days   Lab Units 03/09/22  0614 03/08/22  0641 03/07/22  1103   AST IU/L 180* 238* 237*   ALT IU/L 55* 64* 74*   ALK PHOS IU/L 73 82 61   PROTEIN TOTAL g/dL 5.3* 5.5* 7.0   ALBUMIN g/dL 2.3* 2.6* 3.4*   A/G  RATIO  0.8* 0.9* 0.9*             Results from last 7 days   Lab Units 03/07/22  1103   INR  1.3*   PROTIME Seconds 14.4*         Recent Imaging:  CT chest abdomen pelvis without IV contrast :    Result Date: 03/07/2022  IMPRESSION: Collapse of portions of the mid to lower lungs. Superimposed pneumonia is possible in the appropriate clinical setting. ========================= IMPRESSION: 1.   There is a 3 mm calculus in the urinary bladder. Given the mild left hydronephrosis, this may have recently passed into the urinary bladder from the left ureter. 2.   Urinary bladder is catheterized. Please exclude infection clinically. 3.   Hepatic steatosis.     CT head without contrast    Result Date: 03/07/2022  IMPRESSION: Tiny subarachnoid hemorrhage near the vertex on the right is poorly visualized compared to prior exam. No new hemorrhage. No mass effect or midline shift.     Ultrasound right upper quadrant    Result Date: 03/07/2022  IMPRESSION: 1. Diffuse increased echogenicity of the liver. Differential diagnosis includes, but is not limited to, fatty infiltration, hepatitis, and cirrhosis. 2. Cholelithiasis with small floating stones in the gallbladder lumen, as well as echogenic sludge.     Ultrasound kidney    Result Date: 03/07/2022  IMPRESSION: 1. No solid mass or hydronephrosis seen.     Echo 2D complete w doppler wo contrast    Result Date: 03/07/2022   IMPRESSION ---------------------------------------- 1. Left ventricular cavity size is normal. There is mild concentric left ventricular hypertrophy. Left ventricular EF 40 - 45 %. Unable to assess diastolic function in the setting of tachycardia. 2. Right ventricle is mildly dilated with decreased systolic function. 3. The sinuses of Valsalva  appears dilated, measuring 3.4 cm. The ascending aorta appears dilated, measuring 3.8 cm. 4. 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 It is well seated and does not rock. Transvalvular gradients are within  expected limits for the valve. 5. The mitral valve has mild regurgitation. 6. The tricuspid valve appears structurally normal. The tricuspid valve has trace/physiological regurgitation. The RSVP is estimated at 27-32 mmHg, which is normal. 7. Pulmonic valve appears structurally normal. The pulmonic valve has trace regurgitation. 8. No evidence of vegetation. 9. No prior study available for comparison.     X-ray Chest 1 View    Result Date: 03/07/2022  EXAMINATION: XR CHEST 1 VIEW HISTORY: AHRF. COMPARISON: 12/01/2021 FINDINGS: LINES/TUBES: Right-sided Port-A-Cath in good position.  Right IJ catheter with tip in the right atrium. LUNGS: Bibasilar atelectasis with mild central vascular congestion.  No asymmetric consolidations or mass. CARDIAC: Mild cardiomegaly. MEDIASTINUM: No superior mediastinal widening. Post median sternotomy changes with aortic valve replacement noted. PLEURA: No evidence of pleural effusion or pneumothorax. OTHER: Negative.     IMPRESSION: 1. See Above.     CT head without contrast    Result Date: 03/07/2022  IMPRESSION: -Trace focal subarachnoid hemorrhage noted in a sulcus overlying the right superior parietal lobe, source unclear.  Recommend clinical correlation and further evaluation with CT angiogram of the head and/or contrast-enhanced MRI of the brain, as appropriate. -No hydrocephalus or mass effect. -Nonspecific subcentimeter hypodense focus in the left posterior cerebellar hemisphere, possibly chronic infarct. -Chronic age-related changes. Dictated by: Rogene Houston Electronically Signed by: Rogene Houston on 03/07/2022 12:25 PM Report ID#: [id number]       Assessment & Plan:   ASSESSMENT:  Patient Active Problem List    Diagnosis   . Subarachnoid hemorrhage (HCC)   . Acute encephalopathy   . Septic shock (HCC)   . Acute pyelonephritis   . Acute renal failure, unspecified acute renal failure type (HCC)   . SAH (subarachnoid hemorrhage) (HCC)   . Encephalopathy acute   . History of  amputation of right great toe (HCC)   . Menopausal syndrome on hormone replacement therapy   . Long term (current) use of aromatase inhibitors   . HER2-positive carcinoma of breast (HCC)   . Thickened endometrium   . Malignant neoplasm of hepatic flexure (HCC)   . Axillary adenopathy   . 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 Anaphylaxis   . Back pain, thoracic   . Hyperparathyroidism (HCC)   . Colon cancer (HCC)   . Aortic valve disorder   . Heparin induced thrombocytopenia (HCC)   . Non-ischemic cardiomyopathy (HCC)   . Demand ischemia (HCC)   . Obesity   . Iron deficiency anemia   . Hilar lymphadenopathy- bilataeral    . Liver lesion- small hypodense lesion around falciform lig         CARDIOVASCULAR:   #Shock, presumed septic (resolved)  POA. Severe and refractory. Source still differentiating - query UTI or pneumonia currently. Appropriately fluid resuscitated. S/p stress dose steroids. Levophed 9/26-9/27.  - Given improvement, LR decreased to 100 ml/hr   - Move to PCU 9/28  ?  #Severe Aortic Stenosis s/p Bioprosthetic Aortic Valve - 2017  POA. Complicates care if bacteremic. STAT ECHO shows normally functioning prosthetic valve with no signs of vegetation.  - repeat ECHO 10/01  - consider TEE if source of sepsis not identified  ?  #HFrEF   POA. Does not appear in  exacerbation, appears to have improved following valve replacement in 2017. STAT ECHO shows EF 40-45%.  - Holding carvedilol, atorvastatin, ASA while acutely ill. Can resume when able to swallow  - bedside swallow study 9/28    PULM:   #Acute Hypoxic Respiratory Failure   POA. No baseline O2 use. No known lung disease. Needing 3L oximask on admission. Now on CPAP with FiO2 of 25. S/p NG placed to decompress stomach after bagging.  - RTDP  - Bipap if needed  ?  #Respiratory Alkalosis  POA. Tachypneic due to sepsis/hyperthermia. Improving.    ID:   #[id number]  #MSSA bacteremia of unclear  source  Unclear source of sepsis. Hx of pan-sensitive E. Coli UTI. Urine culture shows coliform bacteria. S/p ceftriaxone, single dose 9/26. Cefepime 9/26-9/28  - vancomycin 9/26-  - ceftriaxone 9/28-  - bacteremic, will need port removed  - repeat blood cultures 8/28  - replace foley 9/28  - once UTI 5-day treatment course complete on 9/30, start nafcillin to cover MSSA bacteremia, discontinue ceftriaxone and vancomycin.    #Intrigenous candidal infection  - nystatin powder and [place] dry sheets    ENDO:   #Hyperparathyroidism s/p parathyroidectomy 2019  Complicates care. Monitor electrolytes.     RENAL:   #Acute Renal Failure   POA. Cr normal at baseline, 4.21 on admission. Renal US showed no solid mass or hydronephrosis, though CT showed calculus in bladder that may have recently passed.  - Fluid resuscitation at above  - Foley, strict I/O   - Pharmacy consulted to review medications   ?  #Anion Gap Metabolic Acidosis  POA. Likely from shock and renal failure. Lactate normal.     GI/FEN:   #Acute Hepatitis   POA. Likely shock liver. RUQ US shows diffuse increased echogenicity of the liver which could indicate fatty infiltration, hepatitis, or cirrhosis. LFTs are trending downward.  - Trend LFTs    NEURO:   #Acute Metabolic Encephalopathy   POA. Likely due to severe sepsis, GCS 3. Treating the underlying causes. Intubation discussed at length with the husband, decided DNR/DNI. Now moving spontaneously, occasionally talking, and able to answer questions.  - Dilaudid PRN for pain   ?  #Trace Non-Aneurysmal Subarachnoid Hemorrhage  POA. Seen on CT head on admission. Discussed with Neurosurgery by ED physician, gave no recommendations for management.   - Seizure precautions   - Avoid anticoagulation for 4 weeks since [facility].  - Now is likely stable enough to undergo brain MRI. Consider when kidney function improved so contrast can be used, to evaluate for metastases or other causes of encephalopathy or  SAH.  ?  #Hyperthermia   POA. Temperature initially nearaly 105F. Temperature has since normalized with scheduled APAP, but with discontinuation is increasing to >100.4  - APAP suppository PRN, could switch back to scheduled if temperature consistently high  - Fans, limit blankets     SKIN/MSK:   #Rhabdomyolysis   POA. Mild. McMahon Score: high risk.  CK peaked 9/27 at 5,722 and then decreased.  - Fluid resuscitation and renal protection measures described above    - IVF 100 ml/hr  ?  #Pressure Injuries, Ecchymosis  POA. Multiple and scattered across back.   - Wound care consultation  - Frequent position changes     HEME/ONC:   #Coagulopathy of Sepsis, Thrombocytopenia  POA. INR elevated. Hgb dropped to 6.9 on 9/27. Transfused 1 unit pRBCs.  - Monitor for signs of bleeding, DIC labs if seen  - Avoid heparin     ?  #  Iron deficiency anemia + chemotherapy-induced anemia  POA. Required blood transfusions in the last month.   - Continue to trend H&H  ?  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 Following with Dr. Wali at [facility], currently on Docetaxel/Trastuzumab. Metastatic to lymph node. Last treatment 02/09/2022. Required blood transfusions due to chemotherapy.   ?  #Colon cancer s/p colectomy 2018  Complicates care, bowel regimen.     Feeding: NPO  Analgesia: Dilaudid PRN  Sedation: None  Thromboprophylaxis: None - subarachnoid hemorrhage   Head of bed: 30 degrees  Ulcer prophylaxis: PPI  Glucose: 5.5% (07/27/2020); 6.0% (01/26/2016); 5.2% (01/21/2016)   Bowel regimen: PRN  I - Lines/Drains/Airway: CVC R IJ 9/26, R radial arterial line 9/26-9/28, NG tube 9/26-9/27, Foley 9/26  Deescalate antibiotics - pending culture results and source identification, see plan above    CODE: DNR/DNI per multiple conversations with patient's husband    Verbal MDPOA: Husband, [name]  DISPO: PCU, guarded improvement    Social: Lives with husband, Jonny Ruiz, who is MDPOA        Richardean Sale   03/09/2022  2:44 PM

## 2022-03-09 NOTE — Consults (Signed)
Brandy [name]  Nov 01, 1955  [id number]  03/07/2022  Bretta Bang, DO    ADMISSION HISTORY AND PHYSICAL    CC: Sepsis, bacteremia    HPI: I was contacted regarding this admitted patient because of her indwelling Port-A-Cath and possible source of her septic thick presentation and her MSSA bacteremia.   Brandy [name] is a 66 y.o. 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   ?  History is collected from her husband. He reports she became acutely weak one day prior to presentation. She had to lock her arms around his neck and he would lift her to make it to the bathroom. Upon waking up this morning, she was non-responsive. EMS was called to help move her, and found her in dire condition, bringing her to the emergency room. Otherwise, her husband is a poor historian and communication is difficult due to hearing loss on his part.   ?ED Course  Found to be hypotensive, tachycardic, and hypoxic. Laboratory studies significant for multisystem failure. Imaging of head significant for a small subarachnoid hemorrhage. Received 1L LR, levophed, CTX, and vancomycin.   The patient has been treated for presumed septic shock, covered with antibiotics for her blood culture positivity, being treated for acute hypoxic respiratory failure and alkalosis, etc.  I reviewed her significant epic notes and studies..  ?  Past Medical History:   Diagnosis Date   . Arrhythmia    . CHF (congestive heart failure) (HCC)    . Colon cancer (HCC) 12/28/2016   . Colonic mass 11/28/2016    POSTOPERATIVE DIAGNOSIS 11/28/16 colonoscopy:  1) Colonoscopy with 3/4 circumference apple core lesion at full insertion of the colonoscope but likely distal to the Hepatic flexure 2) Hemorrhoids: mild internal 3) Mass: hot  forceps biopsied times two without complications and tattooed times two proximal to the mass  Biopsy suspicious for intramucosal adenocarcinoma. Referral to Dr Oran Rein done and she   . Encounter for screening colonoscopy 12/05/2017   . History of blood transfusion    . HIT (heparin-induced thrombocytopenia) (HCC)    . Hyperlipidemia    . 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 Screening for colon cancer 10/04/2016    POSTOPERATIVE DIAGNOSIS 11/28/16 colonoscopy:  1) Colonoscopy with 3/4 circumference apple core lesion at full insertion of the colonoscope but likely distal to the Hepatic flexure 2) Hemorrhoids: mild internal 3) Mass: hot forceps biopsied times two without complications and tattooed times two proximal to the mass  Biopsy suspicious for intramucosal adenocarcinoma. Referral to Dr Oran Rein done and she   . Thyroid disease     Hyperparathyroid     Past Surgical History:   Procedure Laterality Date   . CARDIAC SURGERY  01/23/2016    AVR   . COLON SURGERY  2018    Hemicolectomy   . COLONOSCOPY N/A 05/04/2021    Procedure: COLONOSCOPY; W/ POSSIBLE BIOPSY & POLYPECTOMY;  Surgeon: Truitt Merle, MD;  Location: [facility] OR;  Service: [facility] Procedures;  Laterality: N/A;   . CORONARY ANGIOPLASTY     . DILATION AND CURETTAGE OF UTERUS N/A 09/16/2021    Procedure: DILATATION AND CURETTAGE; HYSTEROSCOPY with myosure;  Surgeon: Conni Elliot, MD;  Location: [facility] OR;  Service: [facility] Procedures;  Laterality: N/A;   . PROCEDURE N/A 01/27/2016  Procedure: AORTIC VALVE REPLACEMENT; INTRA AORTIC BALLOON INSERTION;  Surgeon: Otho Bellows, MD;  Location[facility] OR;  Service: Cardiology;  Laterality: N/A;   . PROCEDURE Right 03/10/2016    Procedure: AMPUTATION TOE;  Surgeon: Chrystie Nose, DPM;  Location: [facility] OR;  Service: Podiatry;  Laterality: Right;   . PROCEDURE N/A 11/28/2016    Procedure: COLONOSCOPY; BIOPSY;  Surgeon: Gailen Shelter, MD;  Location: [facility] OR;   Service: [facility] Procedures;  Laterality: N/A;  ink spot marking apple core mass proximal lesion distal to hepatic flexure   . PROCEDURE Right 12/28/2016    Procedure: LAPAROPSCOPIC CONVERTED TO OPEN HAND ASSISTED RIGHT HEMI COLECTOMY;  Surgeon: Truitt Merle, MD;  Location: [facility] OR;  Service: [facility] Procedures;  Laterality: Right;  Dr. Oran Rein wants 3 hours   . PROCEDURE N/A 01/15/2018    Procedure: COLONOSCOPY; W/ POSSIBLE BIOPSY & POLYPECTOMY;  Surgeon: Truitt Merle, MD;  Location: [facility] OR;  Service: [facility] Procedures;  Laterality: N/A;   . PROCEDURE N/A 03/19/2018    Procedure: PARATHYROIDECTOMY; RECURRENT LARYNGEAL NERVE MONITORING, INTRAOPERATIVE PARATHYROID HORMONE;  Surgeon: Edmund Hilda, MD;  Location: [facility] OR;  Service: Ear Nose and Throat;  Laterality: N/A;   . PROCEDURE Left 10/11/2021    Procedure: RIGHT SUBCLAVIAN PORT PLACEMENT;  Surgeon: Truitt Merle, MD;  Location: [facility] OR;  Service: [facility] Procedures;  Laterality: Left;   . TONSILLECTOMY     . TUBAL LIGATION  1980   . US GUIDED LYMPH NODE BIOPSY  09/15/2021    US GUIDED LYMPH NODE BIOPSY 09/15/2021 Jeanice Lim, MD [facility] [place] [facility]     Family History   Problem Relation Age of Onset   . Colon cancer Mother         dx at older age   . Heart disease Father    . Heart disease Sister    . Cancer Brother    . Heart disease Maternal Aunt    . Ovarian cancer Maternal Aunt    . Cancer Maternal Uncle    . Heart disease Maternal Uncle    . Breast cancer Paternal Aunt      Medical, Surgical, Family history non-contributory    Current Facility-Administered Medications   Medication Dose Route Frequency Provider Last Rate Last Admin   . acetaminophen (TYLENOL) suppository 650 mg  650 mg Rectal Q6H Corinna Gab, MD   650 mg at 03/11/22 1129   . albuterol (PROVENTIL) nebulizer solution 2.5 mg  2.5 mg Nebulization PRN Kathalene Frames, MD   2.5 mg at 03/08/22 1118   . amiodarone in iso-osm dextrose (NEXTERONE) 360 mg/200 mL (1.8 mg/mL) infusion premix  0.5 mg/min Intravenous Continuous Tiney Rouge, MD 16.67 mL/hr at 03/11/22 1251 0.5 mg/min at 03/11/22 1251   . ampicillin-sulbactam (UNASYN) 3 g in 100 mL 0.9 % NaCl SNAP IVPB  3 g Intravenous Q12H Val Eagle, MD   Stopped at 03/11/22 1610   . calcium carbonate (TUMS) chewable tablet 500-1,000 mg  500-1,000 mg Oral TID PRN Arthor Captain, MD       . doxycycline (VIBRAMYCIN) 100 mg in dextrose 5 % (D5W) 100 mL IVPB  100 mg Intravenous Q12H Corinna Gab, MD       . furosemide (LASIX) injection 40 mg  40 mg Intravenous 2 times per day Corinna Gab, MD       . HYDROmorphone (DILAUDID) injection 0.2-0.5 mg  0.2-0.5 mg Intravenous Q1H PRN Kathalene Frames, MD   0.3 mg at 03/11/22 9604   . lidocaine (LIDODERM) 5 %  patch 1 patch  1 patch Transdermal Daily PRN Arthor Captain, MD       . lidocaine 2 % jelly in applicator   Mucous Membrane PRN Corinna Gab, MD       . LORazepam (ATIVAN) injection 0.5 mg  0.5 mg Intravenous Once PRN Kathalene Frames, MD       . magnesium hydroxide (MILK OF MAGNESIA) 400 mg/5 mL oral suspension 30 mL  30 mL Oral Daily PRN Arthor Captain, MD       . naloxone [facility]) injection 0.04 mg  0.04 mg Intravenous Q2 Min PRN Arthor Captain, MD       . naloxone [facility]) injection 0.4 mg  0.4 mg Intravenous Q2 Min PRN Arthor Captain, MD       . norepinephrine (LEVOPHED) 4 mg in NaCl 0.9 % 250 mL (16 mcg/mL) premix infusion  0.5-20 mcg/min Intravenous Titrated Arthor Captain, MD   Paused at 03/08/22 [phone]   . nystatin (MYCOSTATIN) powder   Topical 2 times per day Richardean Sale, MD   Given at 03/11/22 0900   . ondansetron (ZOFRAN-ODT) disintegrating tablet 4 mg  4 mg Oral Q6H PRN Arthor Captain, MD        Or   . ondansetron [facility]) injection 4 mg  4 mg Intravenous Q6H PRN Arthor Captain, MD       . Melene Muller ON 03/12/2022] pantoprazole (PROTONIX) 40 mg in dextrose 5 % (D5W) 100 mL IVPB  40 mg Intravenous Daily Liliane Shi, DO       . polyethylene glycol (MIRALAX) packet 17 g  17 g Oral BID PRN Arthor Captain, MD       . polyvinyl alcohol-povidone(PF) (REFRESH  CLASSIC) opthalmic solution 1 drop  1 drop Both Eyes 4x Daily while awake Corinna Gab, MD   1 drop at 03/11/22 0949   . senna-docusate (PERICOLACE) 8.6-50 mg per tablet 1 tablet  1 tablet Oral BID PRN Arthor Captain, MD       . sodium chloride 0.9 % (NS) for medication or blood product administration 250 mL  250 mL Intravenous PRN Richardean Sale, MD       . sodium chloride 0.9 % (NS) for medication or blood product administration 250 mL  250 mL Intravenous PRN Corinna Gab, MD       . sodium chloride 0.9 % (NS) for medication or blood product administration 250 mL  250 mL Intravenous PRN Corinna Gab, MD       . sulfur hexafluoride (Lumason) lipid-type A microspheres injection 2 mL  2 mL Intravenous ONCE PRN Brooke Dare, MD         Prior to Admission medications    Medication Sig Start Date End Date Taking? Authorizing Provider   aspirin 81 MG EC tablet Take 81 mg by mouth every morning.    Historical Provider, MD   atorvastatin (LIPITOR) 20 mg tablet Take 1 tablet by mouth every night at bedtime.  Patient taking differently: Take 20 mg by mouth every night at bedtime. 07/26/21   Bretta Bang, DO   calcium carbonate/vitamin D3 (CALCIUM 600 + D,3, ORAL) Take 1 tablet by mouth every morning.    Historical Provider, MD   carvediloL (COREG) 6.25 mg tablet Take 1 tablet by mouth 2 times daily with meals. 01/13/22   Bretta Bang, DO   cholecalciferol, vitamin D3, (VITAMIN D3) 50 mcg (2,000 unit) Tab tablet Take 2,000 units by mouth every morning.    Historical Provider, MD   dexAMETHasone (DECADRON) 4 mg tablet  Take 2 tablets by mouth daily with breakfast. Take 8mg  by mouth once daily for three days starting day 2 of each cycle. 01/19/22   West Pugh, FNP   EPINEPHrine (EPIPEN) 0.3 mg/0.3 mL AtIn auto-injector Inject 0.3 mL into the muscle once as needed. 02/27/22   Bretta Bang, DO   ferrous gluconate (FERGON) 324 mg (38 mg iron) tablet Take 1 tablet by mouth daily with breakfast. 02/09/22 02/09/23  Lindley Magnus, MD   HYDROcodone-acetaminophen (NORCO) 5-325 mg per tablet Take 1 tablet by mouth every 6 hours as needed for Pain. 12/08/21   Lindley Magnus, MD   ondansetron (ZOFRAN-ODT) 8 mg disintegrating tablet Take 1 tablet by mouth every 8 hours as needed for Nausea. 10/19/21   Lindley Magnus, MD   prochlorperazine (COMPAZINE) 10 mg tablet Take 1 tablet by mouth every 6 hours as needed. For break through nausea/vomiting. 10/19/21   Lindley Magnus, MD     Allergies   Allergen Reactions   . Bee Sting [Allergen Ext-Venom-Honey Bee] Anaphylaxis     Patient reports an epi-pen    . Heparin Anaphylaxis     Possible HIT   . Sulfa (Sulfonamide Antibiotics) Rash     In ICU for 3 days post dose       Social History     Socioeconomic History   . Marital status: Married     Spouse name: Not on file   . Number of children: Not on file   . Years of education: Not on file   . Highest education level: Not on file   Occupational History   . Not on file   Tobacco Use   . Smoking status: Never     Passive exposure: Yes   . Smokeless tobacco: Never   Vaping Use   . Vaping Use: Never used   Substance and Sexual Activity   . Alcohol use: No   . Drug use: No   . Sexual activity: Not Currently     Partners: Male     Birth control/protection: Post-menopausal   Other Topics Concern   . Not on file   Social History Narrative   . Not on file     Social Determinants of Health     Financial Resource Strain: Low Risk  (11/23/2021)    Financial Resource Strain    . Difficulty of Paying Living Expenses: Not very hard    . Access to Reliable Phone: Yes   Food Insecurity: No Food Insecurity (11/23/2021)    Hunger Vital Sign    . Worried About Programme researcher, broadcasting/film/video in the Last Year: Never true    . Ran Out of Food in the Last Year: Never true   Transportation Needs: No Transportation Needs (11/23/2021)    PRAPARE - Transportation    . Lack of Transportation (Medical): No    . Lack of Transportation (Non-Medical): No   Physical Activity: Not on file   Stress: Not on  file   Social Connections: Moderately Isolated (11/23/2021)    Social Connection and Isolation Panel [NHANES]    . Frequency of Communication with Friends and Family: More than three times a week    . Frequency of Social Gatherings with Friends and Family: Never    . Attends Religious Services: Never    . Active Member of Clubs or Organizations: No    . Attends Banker Meetings: Never    . Marital Status: Married   Catering manager Violence: Not At Risk (  11/23/2021)    Intimate Partner Violence    . Fear of Current or Ex-Partner: No    . Emotionally Abused: No    . Physically Abused: No    . Sexually Abused: No    . Questions Apply to Other Individual: No   Housing Stability: Low Risk  (11/23/2021)    Housing Stability Vital Sign    . Unable to Pay for Housing in the Last Year: No    . Number of Places Lived in the Last Year: 1    . Unstable Housing in the Last Year: No       ROS:  Unable to answer while on BiPAP and severely ill    O:BP 129/88 (BP Location: Left arm, Patient Position: Lying)   Pulse 93   Temp 37.2 ?C (99 ?F) (Foley)   Resp 19   Ht 160 cm (63)   Wt 114.1 kg (251 lb 8.7 oz)   LMP  (LMP Unknown)   SpO2 98%   BMI 44.56 kg/m?   Gen: Morbidly obese, ill-appearing elderly woman, opens eyes and nods appropriately  HEENT: PERRL, EOMI, oropharynx clear, flushed cheeks  Neck: No cervical LAD or thyromegaly  CV: RRR, no murmurs  Resp: Increased WOB, palpable right-sided Port-A-Cath, no erythema, crepitus, induration, drainage  Abd: +BS, soft, obese non-tender, no masses or hernias  Ext: WWP,  No edema  MSK: Strength 5/5 bilat  Neuro: no focal deficits  Psych: A&O, mood and affect appropriate      MICRO:?Contains critical data?Blood Culture -x 2 sets Blood Blood  Order: [id number]   Status: Preliminary result  ?   Visible to patient: No (not released)  ?   Next appt: 03/16/2022 at 09:00 AM in Oncology Lindley Magnus, MD)  ?  Specimen Information: Blood    0 Result Notes  Culture Result Positive  Blood Culture (see Gram stain result); additional workup pending.?Critical?(Crit)            ?   Gram Stain  ?Critical?(Crit)  Gram positive cocci in clusters   1 of 3 bottle(s) / 1 of 2 set(s) positive, collected 03/10/2022.              Resulting Agency: [facility]       Labs:  Results for orders placed or performed during the hospital encounter of 03/07/22 (from the past 48 hour(s))   Comprehensive Metabolic Panel -Daily    Collection Time: 03/10/22  5:32 AM   Result Value Ref Range    Sodium 144 135 - 145 mmol/L    Potassium 3.60 3.50 - 5.10 mmol/L    Chloride 111.3 (H) 101.0 - 111.0 mmol/L    CO2 - Carbon Dioxide 20 17 - 27 mmol/L    BUN 69 (H) 8 - 20 mg/dL    Creatinine 4.09 (H) 0.60 - 1.30 mg/dL    Glucose 811 (H) 74 - 106 mg/dL    Calcium 7.2 (L) 8.6 - 10.6 mg/dL    AST - Aspartate Aminotransferase 123 (H) 5 - 32 IU/L    ALT - Alanine Aminotransferase 44 (H) 5 - 33 IU/L    Alkaline Phosphatase 69 35 - 105 IU/L    Protein Total 5.1 (L) 6.4 - 8.2 g/dL    Albumin 2.3 (L) 3.5 - 5.0 g/dL    Bilirubin Total 9.14 0.10 - 1.70 mg/dL    Anion Gap 78.2 9.0 - 18.0 mmol/L    Albumin/Globulin Ratio 0.8 (L) 1.2 - 2.2    Glomerular Filtration Rate  Estimate (Female) 20 (L) >=60 mL/min/1.69m*2    GFR Additional Info      BUN / Creatinine Ratio 27.2 (H) 12.0 - 20.0    Osmolality Calculation 307.9 (H) 275.0 - 300.0 mOsm/kg   CBC with Auto Differential -Daily    Collection Time: 03/10/22  5:32 AM   Result Value Ref Range    WBC 2.9 (L) 4.5 - 11.0 10*3/?L    RBC 2.35 (L) 3.80 - 5.60 10*6/?L    Hemoglobin 7.3 (L) 11.7 - 16.5 g/dL    HCT 91.4 (L) 78.2 - 48.0 %    MCV 92.3 81.0 - 100.0 fL    MCH 31.1 28.3 - 33.3 pg    MCHC 33.7 32.5 - 36.0 g/dL    RDW 95.6 (H) 21.3 - 16.1 %    Platelet Count 31 (L) 150 - 405 10*3/?L    MPV 11.1 (H) 6.9 - 10.0 fL   Vancomycin, Trough -AM Draw    Collection Time: 03/10/22  5:32 AM   Result Value Ref Range    Vancomycin, Trough 6.5 (L) 15.0 - 19.9 ?g/mL   Cellavision Differential -Next Routine    Collection  Time: 03/10/22  5:32 AM   Result Value Ref Range    WBC 2.9 (L) 4.5 - 11.0 10*3/?L    Neutrophils % 95 (H) 50 - 74 %    Lymphocytes % 4 (L) 20 - 40 %    Monocytes % 1 (L) 2 - 12 %    Eosinophils % 0 0 - 5 %    Basophils % 0 0 - 2 %    Neutrophils, Absolute 2.8 2.0 - 7.4 10*3/?L    Lymphocytes, Absolute 0.1 (L) 1.0 - 4.0 10*3/?L    Monocytes, Absolute 0.0 (L) 0.2 - 1.0 10*3/?L    Eosinophils, Absolute 0.0 0.0 - 0.4 10*3/?L    Basophils, Absolute 0.0 0.0 - 0.1 10*3/?L    Total Cells Counted 100     Platelet Estimate Decreased (A) Adequate    Vacuolated Neutrophils Present (A) (none)    Dohle Bodies Present (A) (none)    Anisocytosis 1+ (A) (none)   Phosphorus -STAT    Collection Time: 03/10/22  5:32 AM   Result Value Ref Range    Phosphorus 3.6 2.5 - 4.7 mg/dL   CK (Creatine Kinase) -Next Routine    Collection Time: 03/10/22  5:32 AM   Result Value Ref Range    CK (Creatine Kinase) 2,735 (H) 26 - 192 IU/L   Prepare RBC -: 1 units STAT    Collection Time: 03/10/22 10:58 AM   Result Value Ref Range    Blood Product Code E0332V00     Blood Product Unit ID [id number]     Blood Product Unit ABO O     Blood Product Unit Rh POS     Blood Product Unit Crossmatch Compatible     Blood Product Status Presumed Transfused     Blood Product Unit Expiration [id number]     Blood Product Blood Type Barcode 5100     Blood Product Unit Volume 300 ml    Blood Product Code Description E0332 AS-1 RBC LR IRR    Magnesium -STAT    Collection Time: 03/10/22 11:18 AM   Result Value Ref Range    Magnesium 1.7 1.3 - 2.5 mg/dL   Basic Metabolic Panel -STAT    Collection Time: 03/10/22 11:18 AM   Result Value Ref Range    Sodium 146 (H) 135 - 145 mmol/L  Potassium 3.62 3.50 - 5.10 mmol/L    Chloride 112.7 (H) 101.0 - 111.0 mmol/L    CO2 - Carbon Dioxide 20 17 - 27 mmol/L    BUN 67 (H) 8 - 20 mg/dL    Creatinine 1.61 (H) 0.60 - 1.30 mg/dL    Glucose 096 (H) 74 - 106 mg/dL    Calcium 7.6 (L) 8.6 - 10.6 mg/dL    Anion Gap 04.5 9.0 - 18.0  mmol/L    Glomerular Filtration Rate Estimate (Female) 21 (L) >=60 mL/min/1.39m*2    GFR Additional Info      BUN / Creatinine Ratio 26.9 (H) 12.0 - 20.0    Osmolality Calculation 310.5 (H) 275.0 - 300.0 mOsm/kg   Troponin T -STAT    Collection Time: 03/10/22 11:18 AM   Result Value Ref Range    Troponin T 0.37 (HCrit) 0.00 - <0.01 ng/mL   Blood Culture -x 2 sets Blood Blood    Collection Time: 03/10/22  4:44 PM    Specimen: Blood   Result Value Ref Range    Blood Culture Result (Crit)      Positive Blood Culture (see Gram stain result); additional workup pending.    Gram Stain Gram positive cocci in clusters (Crit)    Troponin T -STAT    Collection Time: 03/10/22  4:44 PM   Result Value Ref Range    Troponin T 0.37 (HCrit) 0.00 - <0.01 ng/mL   Basic Metabolic Panel -Next Routine    Collection Time: 03/10/22  4:44 PM   Result Value Ref Range    Sodium 146 (H) 135 - 145 mmol/L    Potassium 3.52 3.50 - 5.10 mmol/L    Chloride 111.8 (H) 101.0 - 111.0 mmol/L    CO2 - Carbon Dioxide 20 17 - 27 mmol/L    BUN 67 (H) 8 - 20 mg/dL    Creatinine 4.09 (H) 0.60 - 1.30 mg/dL    Glucose 811 (H) 74 - 106 mg/dL    Calcium 7.6 (L) 8.6 - 10.6 mg/dL    Anion Gap 91.4 9.0 - 18.0 mmol/L    Glomerular Filtration Rate Estimate (Female) 21 (L) >=60 mL/min/1.82m*2    GFR Additional Info      BUN / Creatinine Ratio 27.6 (H) 12.0 - 20.0    Osmolality Calculation 311.8 (H) 275.0 - 300.0 mOsm/kg   Hgb & Hct -Next Routine    Collection Time: 03/10/22  6:12 PM   Result Value Ref Range    Hemoglobin 8.5 (L) 11.7 - 16.5 g/dL    HCT 78.2 (L) 95.6 - 48.0 %   Comprehensive Metabolic Panel -Daily    Collection Time: 03/11/22  7:04 AM   Result Value Ref Range    Sodium 148 (H) 135 - 145 mmol/L    Potassium 3.29 (L) 3.50 - 5.10 mmol/L    Chloride 113.6 (H) 101.0 - 111.0 mmol/L    CO2 - Carbon Dioxide 21 17 - 27 mmol/L    BUN 63 (H) 8 - 20 mg/dL    Creatinine 2.13 (H) 0.60 - 1.30 mg/dL    Glucose 086 (H) 74 - 106 mg/dL    Calcium 7.8 (L) 8.6 - 10.6 mg/dL     AST - Aspartate Aminotransferase 91 (H) 5 - 32 IU/L    ALT - Alanine Aminotransferase 32 5 - 33 IU/L    Alkaline Phosphatase 67 35 - 105 IU/L    Protein Total 5.2 (L) 6.4 - 8.2 g/dL    Albumin 2.1 (L)  3.5 - 5.0 g/dL    Bilirubin Total 0.98 0.10 - 1.70 mg/dL    Anion Gap 11.9 9.0 - 18.0 mmol/L    Albumin/Globulin Ratio 0.7 (L) 1.2 - 2.2    Glomerular Filtration Rate Estimate (Female) 24 (L) >=60 mL/min/1.12m*2    GFR Additional Info      BUN / Creatinine Ratio 28.9 (H) 12.0 - 20.0    Osmolality Calculation 314.1 (H) 275.0 - 300.0 mOsm/kg   CBC with Auto Differential -Daily    Collection Time: 03/11/22  7:04 AM   Result Value Ref Range    WBC 1.7 (LCrit) 4.5 - 11.0 10*3/?L    RBC 2.49 (L) 3.80 - 5.60 10*6/?L    Hemoglobin 7.6 (L) 11.7 - 16.5 g/dL    HCT 14.7 (L) 82.9 - 48.0 %    MCV 91.8 81.0 - 100.0 fL    MCH 30.4 28.3 - 33.3 pg    MCHC 33.1 32.5 - 36.0 g/dL    RDW 56.2 (H) 13.0 - 16.1 %    Platelet Count 33 (L) 150 - 405 10*3/?L    MPV 10.5 (H) 6.9 - 10.0 fL   Cellavision Differential -Next Routine    Collection Time: 03/11/22  7:04 AM   Result Value Ref Range    WBC 1.7 (LCrit) 4.5 - 11.0 10*3/?L    Neutrophils % 87 (H) 50 - 74 %    Lymphocytes % 12 (L) 20 - 40 %    Monocytes % 1 (L) 2 - 12 %    Eosinophils % 0 0 - 5 %    Basophils % 0 0 - 2 %    Neutrophils, Absolute 1.5 (L) 2.0 - 7.4 10*3/?L    Lymphocytes, Absolute 0.2 (L) 1.0 - 4.0 10*3/?L    Monocytes, Absolute 0.0 (L) 0.2 - 1.0 10*3/?L    Eosinophils, Absolute 0.0 0.0 - 0.4 10*3/?L    Basophils, Absolute 0.0 0.0 - 0.1 10*3/?L    Total Cells Counted 100     Platelet Estimate Decreased (A) Adequate    Dohle Bodies Present (A) (none)    Anisocytosis 1+ (A) (none)   Prepare RBC -: 1 units STAT    Collection Time: 03/11/22  7:40 AM   Result Value Ref Range    Blood Product Code Q6578I69     Blood Product Unit ID [id number]     Blood Product Unit ABO O     Blood Product Unit Rh NEG     Blood Product Unit Crossmatch Compatible     Blood Product Status Issued      Blood Product Unit Expiration [id number]     Blood Product Blood Type Barcode 9500     Blood Product Unit Volume 300 ml    Blood Product Code Description K7425 AS-1 RBC LR IRR        Imaging:  X-ray Chest 1 View   Final Result by Alcus Dad, MD (09/29 1136)   EXAMINATION:   XR CHEST 1 VIEW      HISTORY:   svt.      COMPARISON:   03/07/2022      FINDINGS:   LINES/TUBES:  The following lines are seen - Central line seen placed    through a right jugular approach with the distal tip at the cavoatrial    junction. Central line and associated port placed through a right    subclavian approach with the distal tip at the    right atrium.  LUNGS:   1. Patchy mixed interstitial and airspace opacities bilaterally.  These    appears slightly worse than 03/07/2022 differential diagnosis includes,    but is not limited to, pulmonary edema, pneumonia, and ARDS.      CARDIAC: Normal size cardiac silhouette. Sternal wires and evidence of    prior mediastinal surgery. Aortic valve prosthesis is seen in place.   MEDIASTINUM: No superior mediastinal widening.   PLEURA: Mild blunting of the right costophrenic angle.   OTHER: Negative.         IMPRESSION:   1. See Above.      NOTE:   This dictation was produced using voice recognition software. Although    effort has been made to minimize transcription errors, homonyms and other    transcription errors may be present and may not truly reflect my intent.    Please excuse any unintentional    verbiage, or grammatical errors.      Dictated by: Junious Dresser   Electronically Signed by: Junious Dresser on 03/10/2022 11:36 AM      Report ID#: [id number]            CT chest abdomen pelvis without IV contrast :   Final Result by Glenetta Hew, MD (09/26 2142)   [facility] - [id number]   PROCEDURE INFORMATION:    Exam: CT Chest Without Contrast; Diagnostic    Exam date and time: 03/07/2022 8:22 PM    Age: 66 years old    Clinical indication: Sepsis of unknown origin, no  contrast due to    renal function       TECHNIQUE:    Imaging protocol: Diagnostic computed tomography of the chest without    contrast.    Radiation optimization: All CT scans at this facility use at least    one of these dose optimization techniques: automated exposure    control; mA and/or kV adjustment per patient size (includes targeted    exams where dose is matched to clinical indication); or iterative    reconstruction.       REPORTING DATA:    Count of CT and Cardiac NM exams in prior 12 months: This patient has    received 3 known CTs and 0 known cardiac nuclear medicine studies in    the 12 months prior to the current study.       COMPARISON:    PT NM PET CT SKULL TO THIGH 08/14/2021 11:24       FINDINGS:    Tubes, catheters and devices: Right port tip is in the mid SVC.    Aortic valve prosthesis.       Lungs: Collapse of portions of the mid to lower lungs. Mild scarring    and atelectasis in the lower lungs.    Pleural spaces: Unremarkable. No pneumothorax. No pleural effusion.    Heart: Cardiomegaly. Mitral valve calcifications.    Coronary arteries: Coronary artery calcifications.    Lymph nodes: Unremarkable. No enlarged lymph nodes.    Vasculature: The aorta demonstrates moderate atherosclerotic disease.    Diaphragm: Small hiatal hernia.       Bones/joints: Status post median sternotomy and coronary artery    bypass.    Soft tissues: Unremarkable.       IMPRESSION:    Collapse of portions of the mid to lower lungs. Superimposed    pneumonia is possible in the appropriate clinical setting.          =========================   PROCEDURE INFORMATION:  Exam: CT Abdomen And Pelvis Without Contrast    Exam date and time: 03/07/2022 8:22 PM    Age: 66 years old    Clinical indication: Sepsis of unknown origin, no contrast due to    renal function       TECHNIQUE:    Imaging protocol: Computed tomography of the abdomen and pelvis    without contrast.    Radiation optimization: All CT scans at this  facility use at least    one of these dose optimization techniques: automated exposure    control; mA and/or kV adjustment per patient size (includes targeted    exams where dose is matched to clinical indication); or iterative    reconstruction.       REPORTING DATA:    Count of CT and Cardiac NM exams in prior 12 months: This patient has    received 3 known CTs and 0 known cardiac nuclear medicine studies in    the 12 months prior to the current study.       COMPARISON:    PT NM PET CT SKULL TO THIGH 08/14/2021 11:24       FINDINGS:    Tubes, catheters and devices: Urinary bladder is catheterized.       Liver: Hepatic steatosis.    Gallbladder and bile ducts: Normal. No calcified stones. No ductal    dilation.    Pancreas: Normal. No ductal dilation.    Spleen: Normal. No splenomegaly.    Adrenal glands: Normal. No mass.    Kidneys and ureters: Mild left hydronephrosis extending to the    urinary bladder.    Stomach and bowel: Mild sigmoid diverticulosis without    diverticulitis. Postsurgical changes of right colon.    Appendix: No evidence of appendicitis.       Intraperitoneal space: Unremarkable. No free air. No significant    fluid collection.    Vasculature: The arteries demonstrate mild atherosclerotic disease.    Lymph nodes: Unremarkable. No enlarged lymph nodes.    Urinary bladder: There is a 3 mm calculus in the urinary bladder.    Reproductive: Unremarkable as visualized.    Bones/joints: Unremarkable. No acute fracture.    Soft tissues: Tiny fat containing umbilical hernia.       Other findings: Stigmata of old granulomatous disease.       IMPRESSION:    1.   There is a 3 mm calculus in the urinary bladder. Given the mild    left hydronephrosis, this may have recently passed into the urinary    bladder from the left ureter.    2.   Urinary bladder is catheterized. Please exclude infection    clinically.    3.   Hepatic steatosis.       CT head without contrast   Final Result by William Dalton, MD (09/26  2124)   [facility] - [phone]   PROCEDURE INFORMATION:    Exam: CT Head Without Contrast    Exam date and time: 03/07/2022 8:22 PM    Age: 66 years old    Clinical indication: Known subarachnoid hemorrhage, evaluating for    progression, cannot obtain mri due to unstable status/renal FX       TECHNIQUE:    Imaging protocol: Computed tomography of the head without contrast.    Radiation optimization: All CT scans at this facility use at least    one of these dose optimization techniques: automated exposure    control; mA and/or kV adjustment per patient  size (includes targeted    exams where dose is matched to clinical indication); or iterative    reconstruction.       REPORTING DATA:    Count of CT and Cardiac NM exams in prior 12 months: This patient has    received 3 known CTs and 0 known cardiac nuclear medicine studies in    the 12 months prior to the current study.       COMPARISON:    CT HEAD WO CONTRAST 03/07/2022 11:48 AM       FINDINGS:    Brain: Tiny subarachnoid hemorrhage near the vertex on the right is    poorly visualized compared to prior exam. No new hemorrhage. No mass    effect or midline shift.    Cerebral ventricles: No ventriculomegaly.    Paranasal sinuses: Visualized sinuses are unremarkable.     Mastoid air cells: No mastoid effusion.       Bones/joints: No acute fracture.    Soft tissues: Unremarkable.       IMPRESSION:    Tiny subarachnoid hemorrhage near the vertex on the right is poorly    visualized compared to prior exam. No new hemorrhage. No mass effect    or midline shift.       Ultrasound kidney   Final Result by Alcus Dad, MD (09/26 1822)   EXAMINATION:   US KIDNEY      HISTORY:   Sepsis, may be urinary origin, r/o hydro/pyelo      COMPARISON:   None.      TECHNIQUE:   Ultrasound examination was performed of the kidneys and bladder.      MEASUREMENTS:   The following measurements are provided, if available:      RIGHT KIDNEY LENGTH: 10.0 cm   RIGHT KIDNEY  HEIGHT: 5.1 cm   RIGHT KIDNEY WIDTH:   5.5 cm   RIGHT KIDNEY VOLUME:  148.0 ml      LEFT KIDNEY LENGTH:  11.2 cm   LEFT KIDNEY HEIGHT:  6.5 cm   LEFT KIDNEY WIDTH: 5.1 cm   LEFT KIDNEY VOLUME:  195.2 ml      * If measurements of resistive indices of the kidneys is desired, then    vascular ultrasound of the renal arteries should be ordered.      BLADDER PREVOID VOLUME:  .   BLADDER POSTVOID VOLUME:  .      FINDINGS:   RIGHT KIDNEY: No evidence of solid mass, or complex cyst. No evidence of    hydronephrosis. Renal parenchyma demonstrates normal echogenicity, and    cortical thickness.   LEFT KIDNEY: No evidence of solid mass, or complex cyst. No evidence of    hydronephrosis. Renal parenchyma demonstrates normal echogenicity, and    cortical thickness.   BLADDER: Bladder is decompressed by catheter.   OTHER: Negative.         IMPRESSION:   1. No solid mass or hydronephrosis seen.   NOTE:   This dictation was produced using voice recognition software. Although    effort has been made to minimize transcription errors, homonyms and other    transcription errors may be present and may not truly reflect my intent.    Please excuse any unintentional    verbiage, or grammatical errors.      Dictated by: Junious Dresser   Electronically Signed by: Junious Dresser on 03/07/2022 6:22 PM      Report ID#: [id number]  Ultrasound right upper quadrant   Final Result by Alcus Dad, MD (09/26 1823)   EXAMINATION:   [place] RUQ      HISTORY:   Sepsis, unclear source, r/o gallbladder      COMPARISON:   None.      TECHNIQUE:   High resolution sonographic examination was performed of the region of    interest of the abdomen.   MEASUREMENTS:   The following measurements are provided, if available:      LIVER: 16.5 cm   GALLBLADDER: 5.9 cm   GALLBLADDER THICKNESS: 0.3 cm   COMMON BILE DUCT: 0.4 cm      FINDINGS:   LIVER: Normal echogenicity.  No focal intrahepatic mass.   GALLBLADDER: Cholelithiasis with small floating stones in the  gallbladder    lumen, as well as echogenic sludge.  No gallbladder wall thickening or    pericholecystic fluid.   BILIARY: No intrahepatic or extrahepatic dilatation.   PANCREAS: The visualized portion is unremarkable by ultrasound.   OTHER: None.         IMPRESSION:   1. Diffuse increased echogenicity of the liver. Differential diagnosis    includes, but is not limited to, fatty infiltration, hepatitis, and    cirrhosis.   2. Cholelithiasis with small floating stones in the gallbladder lumen, as    well as echogenic sludge.   NOTE:   This dictation was produced using voice recognition software. Although    effort has been made to minimize transcription errors, homonyms and other    transcription errors may be present and may not truly reflect my intent.    Please excuse any unintentional    verbiage, or grammatical errors.      Dictated by: Junious Dresser   Electronically Signed by: Junious Dresser on 03/07/2022 6:23 PM      Report ID#: [id number]            Echo 2D complete w doppler wo contrast   Final Result by Tiney Rouge, MD (09/26 1746)   Echo   ---------------------------------------------------------------------------   ------------------------------   Dennie Bible. Name: FRADY, BETLER Study Date: 03/07/2022 2:11pm   Pat. NO: [id number] Referring  MD: Brooke Dare   Site: [facility] ED Sonographer: Elwanda Brooklyn, BS, RDCS   DOB: Feb 01, 1956 Age: 43   ---------------------------------------------------------------------------   ------------------------------      PROCEDURE/STUDY QUALITY   ----------------------------------------   ECHO 2D COMPLETE W DOPPLER W CONTRAST - A transthoracic study was    performed including 2D, M-mode, spectral Doppler, and color-flow Doppler    imaging with   Lumason contrast. View: This study is technically suboptimal due to the    patient's inability to be optimally positioned for the exam.            CONTRAST   ----------------------------------------   Lumason contrast was used to opacify the left  ventricular cavity. Please    see left ventricular section for observations regarding the use of this    agent.            INDICATION   ----------------------------------------   Septic shock, bovine aortic valve, assess for vegetations            MEASURE   ----------------------------------------   LVEDV (MOD A4C)  116.4 ml  [48.0-140.0]   LV HR  126 bpm   RVIDd  3.4 cm   RV Major  6.8 cm   RV Minor  4.3 cm   RAA (s)  15.7 cm?  [10.0-18.0]   LVOT  Diam  2.0 cm   Sinus. of Valsalva Diam   3.4 cm  [2.7-3.3]   Ao st junct Diam   3.2 cm  [2.3-2.9]   Ao Asc Diam   3.8 cm  [2.3-3.1]   LVOT Vmax  1.17 m/s   LVOT Vmean  0.90 m/s   LVOT max PG  5.48 mmHg   LVOT mean PG  3.75 mmHg   LVOT VTI  21.8 cm   LVOT SV  69.3 ml   LVOT SVI  33.9 ml/m?   LVOT CO  8.19 l/min   LVOT CI  4.02 l/min/m?   AV Vmax  2.51 m/s   AV max PG  30.33 mmHg   AV Vmean  1.89 m/s   AV mean PG  15.89 mmHg   AV VTI  39.6 cm   Dimensionless Index VTI  0.55   AVA (Vmax)  1.5 cm?   AVA (VTI)  1.7 cm?   AVAI (VTI)  0.9 cm?/m?   TR Vmax  2.59 m/s   TR max PG  26.8 mmHg   MV E' Sept  10 cm/s  [6-15]   MV E' Lat  12.1 cm/s  [5.9-19.9]            LEFT VENTRICLE   ----------------------------------------   Left ventricular cavity size is normal. There is mild concentric left    ventricular hypertrophy. Left ventricular EF 40 - 45 %. Unable to assess    diastolic function in the setting of   tachycardia.            RIGHT VENTRICLE   ----------------------------------------   Right ventricle is mildly dilated with decreased systolic function.            LEFT ATRIUM   ----------------------------------------   The left atrium appears normal in size.            RIGHT ATRIUM   ----------------------------------------   The right atrial size is normal.            AORTA/AORTIC ROOT   ----------------------------------------   The sinuses of Valsalva appears dilated, measuring 3.4 cm. The ascending    aorta appears dilated, measuring 3.8 cm.            AORTIC VALVE    ----------------------------------------   There is a normally functioning bioprosthetic aortic valve. It is well    seated and does not rock. Transvalvular gradients are within expected    limits for the valve. There is no   evidence of aortic stenosis. There is no evidence of aortic regurgitation.            MITRAL VALVE   ----------------------------------------   The mitral valve appears structurally normal. The mitral valve has mild    regurgitation.            TRICUSPID VALVE   ----------------------------------------   The tricuspid valve appears structurally normal. The tricuspid valve has    trace/physiological regurgitation. The RSVP is estimated at 27-32 mmHg,    which is normal.            PULMONIC VALVE   ----------------------------------------   Pulmonic valve appears structurally normal. The pulmonic valve has trace    regurgitation.            PERICARDIUM   ----------------------------------------   There is no pericardial effusion.            IMPRESSION   ----------------------------------------         1. Left ventricular cavity  size is normal. There is mild concentric left    ventricular hypertrophy. Left ventricular EF 40 - 45 %. Unable to assess    diastolic function in the setting   of tachycardia.   2. Right ventricle is mildly dilated with decreased systolic function.   3. The sinuses of Valsalva appears dilated, measuring 3.4 cm. The    ascending aorta appears dilated, measuring 3.8 cm.   4. 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 It is well    seated and does not rock. Transvalvular gradients are within expected    limits for the valve.   5. The mitral valve has mild regurgitation.   6. The tricuspid valve appears structurally normal. The tricuspid valve    has trace/physiological regurgitation. The RSVP is estimated at 27-32    mmHg, which is normal.   7. Pulmonic valve appears structurally normal. The pulmonic valve has    trace regurgitation.   8. No evidence of  vegetation.   9. No prior study available for comparison.               X-ray Chest 1 View   Final Result by Daria Pastures, MD (09/26 1408)   EXAMINATION:   XR CHEST 1 VIEW      HISTORY:   AHRF.      COMPARISON:   12/01/2021      FINDINGS:   LINES/TUBES: Right-sided Port-A-Cath in good position.  Right IJ catheter    with tip in the right atrium.   LUNGS: Bibasilar atelectasis with mild central vascular congestion.  No    asymmetric consolidations or mass.   CARDIAC: Mild cardiomegaly.   MEDIASTINUM: No superior mediastinal widening. 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   PLEURA: No evidence of pleural effusion or pneumothorax.   OTHER: Negative.         IMPRESSION:   1. See Above.      NOTE:   This dictation was produced using voice recognition software. Although    effort has been made to minimize transcription errors, homonyms and other    transcription errors may be present and may not truly reflect my intent.    Please excuse any unintentional    verbiage, or grammatical errors.      Dictated by: Marlowe Shores   Electronically Signed by: Marlowe Shores on 03/07/2022 2:08 PM      Report ID#: [id number]            CT head without contrast   Final Result by Rogene Houston, MD (09/26 1225)   EXAMINATION:   CT HEAD WO CONTRAST      HISTORY:   low gcs, met breast CA      COMPARISON:   None.      TECHNIQUE:   CT images were obtained without intravenous contrast. Automated exposure    control based on patient size was utilized as a dose reduction technique.      FINDINGS:   INTRACRANIAL HEMORRHAGE:  Trace focal subarachnoid hemorrhage noted in a    sulcus overlying the right superior parietal lobe (coronal 39).   VENTRICLES and EXTRA-AXIAL SPACES:  No hydrocephalus.  No extra-axial    collection. The basal cisterns are patent.   BRAIN:  No mass effect or midline shift.  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  There    is  age-related global volume loss. Nonspecific areas of  periventricular    and subcortical white matter    hypoattenuation likely due to age-related chronic microvascular disease.   BONES and SOFT TISSUES:  Unremarkable.      COMMUNICATION   The critical information above was relayed directly by me by telephone to    Brooke Dare immediately upon discovery on 03/07/2022 at 12:07 pm.         IMPRESSION:   -Trace focal subarachnoid hemorrhage noted in a sulcus overlying the right    superior parietal lobe, source unclear.  Recommend clinical correlation    and further evaluation with CT angiogram of the head and/or    contrast-enhanced MRI of the brain, as    appropriate.   -No hydrocephalus or mass effect.   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   -Chronic age-related changes.      Dictated by: Rogene Houston   Electronically Signed by: Rogene Houston on 03/07/2022 12:25 PM      Report ID#: [id number]            X-ray abdomen AP    (Results Pending)     Recent Imaging:  CT chest abdomen pelvis without IV contrast :  ?  Result Date: 03/07/2022  IMPRESSION: Collapse of portions of the mid to lower lungs. Superimposed pneumonia is possible in the appropriate clinical setting. ========================= IMPRESSION: 1.   There is a 3 mm calculus in the urinary bladder. Given the mild left hydronephrosis, this may have recently passed into the urinary bladder from the left ureter. 2.   Urinary bladder is catheterized. Please exclude infection clinically. 3.   Hepatic steatosis.   ?  CT head without contrast  ?  Result Date: 03/07/2022  IMPRESSION: Tiny subarachnoid hemorrhage near the vertex on the right is poorly visualized compared to prior exam. No new hemorrhage. No mass effect or midline shift.   ?  Ultrasound right upper quadrant  ?  Result Date: 03/07/2022  IMPRESSION: 1. Diffuse increased echogenicity of the liver. Differential diagnosis includes, but  is not limited to, fatty infiltration, hepatitis, and cirrhosis. 2. Cholelithiasis with small floating stones in the gallbladder lumen, as well as echogenic sludge.   ?  Ultrasound kidney  ?  Result Date: 03/07/2022  IMPRESSION: 1. No solid mass or hydronephrosis seen.   ?  Echo 2D complete w doppler wo contrast  ?  Result Date: 03/07/2022   IMPRESSION ---------------------------------------- 1. Left ventricular cavity size is normal. There is mild concentric left ventricular hypertrophy. Left ventricular EF 40 - 45 %. Unable to assess diastolic function in the setting of tachycardia. 2. Right ventricle is mildly dilated with decreased systolic function. 3. The sinuses of Valsalva appears dilated, measuring 3.4 cm. The ascending aorta appears dilated, measuring 3.8 cm. 4. 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 It is well seated and does not rock. Transvalvular gradients are within expected limits for the valve. 5. The mitral valve has mild regurgitation. 6. The tricuspid valve appears structurally normal. The tricuspid valve has trace/physiological regurgitation. The RSVP is estimated at 27-32 mmHg, which is normal. 7. Pulmonic valve appears structurally normal. The pulmonic valve has trace regurgitation. 8. No evidence of vegetation. 9. No prior study available for comparison.   ?  X-ray Chest 1 View  ?  Result Date: 03/07/2022  EXAMINATION: XR CHEST 1 VIEW HISTORY: AHRF. COMPARISON: 12/01/2021 FINDINGS: LINES/TUBES: Right-sided Port-A-Cath in good position.  Right IJ catheter with tip in the  right atrium. LUNGS: Bibasilar atelectasis with mild central vascular congestion.  No asymmetric consolidations or mass. CARDIAC: Mild cardiomegaly. MEDIASTINUM: No superior mediastinal widening. Post median sternotomy changes with aortic valve replacement noted. PLEURA: No evidence of pleural effusion or pneumothorax. OTHER: Negative.   ?  IMPRESSION: 1. See Above.   ?  CT head without contrast  ?  Result  Date: 03/07/2022  IMPRESSION: -Trace focal subarachnoid hemorrhage noted in a sulcus overlying the right superior parietal lobe, source unclear.  Recommend clinical correlation and further evaluation with CT angiogram of the head and/or contrast-enhanced MRI of the brain, as appropriate. -No hydrocephalus or mass effect. -Nonspecific subcentimeter hypodense focus in the left posterior cerebellar hemisphere, possibly chronic infarct. -Chronic age-related changes. Dictated by: Rogene Houston Electronically Signed by: Rogene Houston on 03/07/2022 12:25 PM Report ID#: [id number]   ?  ?  EKG:    A/P: MSSA bacteremia with septic shock, immunocompromised from chemotherapy, indwelling Port-A-Cath, has had bioprosthetic aortic valve replacement.  A detailed PARQ conference was completed with the patient and husband in the ICU. Minor and major risks including death were explained, understood, and accepted.Questions answered in full detail and patient requested no further details.   Plan-Port-A-Cath removal and catheter tip culture    Assessment & Plan:   ASSESSMENT:      Patient Active Problem List   ? Diagnosis   . Subarachnoid hemorrhage (HCC)   . Acute encephalopathy   . Septic shock (HCC)   . Acute pyelonephritis   . Acute renal failure, unspecified acute renal failure type (HCC)   . SAH (subarachnoid hemorrhage) (HCC)   . Encephalopathy acute   . History of amputation of right great toe (HCC)   . Menopausal syndrome on hormone replacement therapy   . Long term (current) use of aromatase inhibitors   . HER2-positive carcinoma of breast (HCC)   . Thickened endometrium   . Malignant neoplasm of hepatic flexure (HCC)   . Axillary adenopathy   . 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 Anaphylaxis   . Back pain, thoracic   . Hyperparathyroidism (HCC)   . Colon cancer (HCC)   . Aortic valve disorder   . Heparin induced thrombocytopenia (HCC)   . Non-ischemic cardiomyopathy  (HCC)   . Demand ischemia (HCC)   . Obesity   . Iron deficiency anemia   . Hilar lymphadenopathy- bilataeral    . Liver lesion- small hypodense lesion around falciform lig   ?  ?  ?  CARDIOVASCULAR:   #Shock, presumed septic (resolved)  POA. Severe and refractory. Source still differentiating - query UTI or pneumonia currently. Appropriately fluid resuscitated. S/p stress dose steroids. Levophed 9/26-9/27.  - Given improvement, LR decreased to 100 ml/hr   - Move to PCU 9/28  ?  #Severe Aortic Stenosis s/p Bioprosthetic Aortic Valve - 2017  POA. Complicates care if bacteremic. STAT ECHO shows normally functioning prosthetic valve with no signs of vegetation.  - repeat ECHO 10/01  - consider TEE if source of sepsis not identified  ?  #HFrEF   POA. Does not appear in exacerbation, appears to have improved following valve replacement in 2017. STAT ECHO shows EF 40-45%.  - Holding carvedilol, atorvastatin, ASA while acutely ill. Can resume when able to swallow  - bedside swallow study 9/28  ?  PULM:   #Acute Hypoxic Respiratory Failure   POA. No baseline O2 use. No known lung disease. Needing 3L oximask  on admission. Now on CPAP with FiO2 of 25. S/p NG placed to decompress stomach after bagging.  - RTDP  - Bipap if needed  ?  #Respiratory Alkalosis  POA. Tachypneic?due to sepsis/hyperthermia. Improving.  ?  ID:   #UTI  #MSSA bacteremia of unclear source  Unclear source of sepsis. Hx of pan-sensitive E. Coli UTI. Urine culture shows coliform bacteria. S/p ceftriaxone, single dose 9/26. Cefepime 9/26-9/28  - vancomycin 9/26-  - ceftriaxone 9/28-  - bacteremic, will need port removed  - repeat blood cultures 8/28  - replace foley 9/28  - once UTI 5-day treatment course complete on 9/30, start nafcillin to cover MSSA bacteremia, discontinue ceftriaxone and vancomycin.  ?  #Intrigenous candidal infection  - nystatin powder and Skinfold dry sheets  ?  ENDO:   #Hyperparathyroidism s/p parathyroidectomy 2019  Complicates care.  Monitor electrolytes.   ?  RENAL:   #Acute Renal Failure   POA. Cr normal at baseline, 4.21 on admission. Renal US showed no solid mass or hydronephrosis, though CT showed calculus in bladder that may have recently passed.  - Fluid resuscitation at above  - Foley, strict I/O   - Pharmacy consulted to review medications?  ?  #Anion Gap Metabolic Acidosis  POA. Likely from shock and renal failure. Lactate normal.   ?  GI/FEN:   #Acute Hepatitis   POA. Likely shock liver. RUQ US shows diffuse increased echogenicity of the liver which could indicate fatty infiltration, hepatitis, or cirrhosis. LFTs are trending downward.  - Trend LFTs  ?  NEURO:   #Acute Metabolic Encephalopathy   POA. Likely due to severe sepsis, GCS 3. Treating the underlying causes. Intubation discussed at length with the husband, decided DNR/DNI. Now moving spontaneously, occasionally talking, and able to answer questions.  - Dilaudid PRN for pain   ?  #Trace?Non-Aneurysmal?Subarachnoid Hemorrhage  POA. Seen on CT head on admission. Discussed with Neurosurgery by ED physician, gave no recommendations for management.?  - Seizure precautions   - Avoid anticoagulation for 4 weeks since [facility].  - Now is likely stable enough to undergo brain MRI. Consider when kidney function improved so contrast can be used, to evaluate for metastases or other causes of encephalopathy or SAH.  ?  #Hyperthermia   POA. Temperature initially nearaly 105F. Temperature has since normalized with scheduled APAP, but with discontinuation is increasing to >100.4  - APAP suppository PRN, could switch back to scheduled if temperature consistently high  - Fans, limit blankets?  ?  SKIN/MSK:   #Rhabdomyolysis   POA. Mild. McMahon Score: high risk.  CK peaked 9/27 at 5,722 and then decreased.  - Fluid resuscitation and renal protection measures described above ?  - IVF 100 ml/hr  ?  #Pressure Injuries, Ecchymosis  POA. Multiple and scattered across back.   - Wound care consultation  -  Frequent position changes?  ?  HEME/ONC:   #Coagulopathy of Sepsis, Thrombocytopenia  POA. INR elevated. Hgb dropped to 6.9 on 9/27. Transfused 1 unit pRBCs.  - Monitor for signs of bleeding, DIC labs if seen  - Avoid heparin   ?  ?  #Iron deficiency anemia + chemotherapy-induced anemia  POA. Required blood transfusions in the last month.   - Continue to trend H&H  ?  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 Following with Dr. Nadeen Landau at [facility], currently on?Docetaxel/Trastuzumab. Metastatic to lymph node. Last treatment 02/09/2022. Required blood transfusions due to chemotherapy.?  ?  #Colon cancer s/p colectomy 2018  Complicates care, bowel regimen.?  ?  Feeding:?NPO  Analgesia:?Dilaudid PRN  Sedation:?None  Thromboprophylaxis:?None - subarachnoid hemorrhage?  Head of bed:?30 degrees  Ulcer prophylaxis:?PPI  Glucose:?5.5% (07/27/2020); 6.0% (01/26/2016); 5.2% (01/21/2016)?  Bowel regimen:?PRN  I - Lines/Drains/Airway:?CVC R IJ 9/26, R radial arterial line 9/26-9/28, NG tube 9/26-9/27, Foley 9/26  Deescalate antibiotics - pending culture results and source identification, see plan above  ?  CODE: DNR/DNI per multiple conversations with patient's husband?   Verbal MDPOA: Husband, [name]  DISPO: PCU, guarded improvement  ?  Social: Lives with husband, Jonny Ruiz, who is MDPOA  ?     Richardean Saleiran   03/09/2022  2:44 PM      # VTE prophylaxis:    # Code status: DNR    Electronically Signed by  Dorice Lamas, MD  March 11, 2022 3:08 PM

## 2022-03-10 ENCOUNTER — Inpatient Hospital Stay: Admit: 2022-03-10

## 2022-03-10 LAB — CBC WITH AUTO DIFFERENTIAL
HCT: 21.7 % — ABNORMAL LOW (ref 35.0–48.0)
Hemoglobin: 7.3 g/dL — ABNORMAL LOW (ref 11.7–16.5)
MCH: 31.1 pg (ref 28.3–33.3)
MCHC: 33.7 g/dL (ref 32.5–36.0)
MCV: 92.3 fL (ref 81.0–100.0)
MPV: 11.1 fL — ABNORMAL HIGH (ref 6.9–10.0)
Platelet Count: 31 10*3/ÂµL — ABNORMAL LOW (ref 150–405)
RBC: 2.35 10*6/ÂµL — ABNORMAL LOW (ref 3.80–5.60)
RDW: 19.9 % — ABNORMAL HIGH (ref 11.7–16.1)
WBC: 2.9 10*3/ÂµL — ABNORMAL LOW (ref 4.5–11.0)

## 2022-03-10 LAB — BASIC METABOLIC PANEL
Anion Gap: 13.3 mmol/L (ref 9.0–18.0)
Anion Gap: 14.2 mmol/L (ref 9.0–18.0)
BUN / Creatinine Ratio: 26.9 — ABNORMAL HIGH (ref 12.0–20.0)
BUN / Creatinine Ratio: 27.6 — ABNORMAL HIGH (ref 12.0–20.0)
BUN: 67 mg/dL — ABNORMAL HIGH (ref 8–20)
BUN: 67 mg/dL — ABNORMAL HIGH (ref 8–20)
CO2 - Carbon Dioxide: 20 mmol/L (ref 17–27)
CO2 - Carbon Dioxide: 20 mmol/L (ref 17–27)
Calcium: 7.6 mg/dL — ABNORMAL LOW (ref 8.6–10.6)
Calcium: 7.6 mg/dL — ABNORMAL LOW (ref 8.6–10.6)
Chloride: 111.8 mmol/L — ABNORMAL HIGH (ref 101.0–111.0)
Chloride: 112.7 mmol/L — ABNORMAL HIGH (ref 101.0–111.0)
Creatinine: 2.43 mg/dL — ABNORMAL HIGH (ref 0.60–1.30)
Creatinine: 2.49 mg/dL — ABNORMAL HIGH (ref 0.60–1.30)
Glomerular Filtration Rate Estimate (Female): 21 mL/min/{1.73_m2} — ABNORMAL LOW (ref >=60)
Glomerular Filtration Rate Estimate (Female): 21 mL/min/{1.73_m2} — ABNORMAL LOW (ref >=60)
Glucose: 108 mg/dL — ABNORMAL HIGH (ref 74–106)
Glucose: 132 mg/dL — ABNORMAL HIGH (ref 74–106)
Osmolality Calculation: 310.5 mOsm/kg — ABNORMAL HIGH (ref 275.0–300.0)
Osmolality Calculation: 311.8 mOsm/kg — ABNORMAL HIGH (ref 275.0–300.0)
Potassium: 3.52 mmol/L (ref 3.50–5.10)
Potassium: 3.62 mmol/L (ref 3.50–5.10)
Sodium: 146 mmol/L — ABNORMAL HIGH (ref 135–145)
Sodium: 146 mmol/L — ABNORMAL HIGH (ref 135–145)

## 2022-03-10 LAB — COMPREHENSIVE METABOLIC PANEL
ALT - Alanine Aminotransferase: 44 IU/L — ABNORMAL HIGH (ref 5–33)
AST - Aspartate Aminotransferase: 123 IU/L — ABNORMAL HIGH (ref 5–32)
Albumin/Globulin Ratio: 0.8 — ABNORMAL LOW (ref 1.2–2.2)
Albumin: 2.3 g/dL — ABNORMAL LOW (ref 3.5–5.0)
Alkaline Phosphatase: 69 IU/L (ref 35–105)
Anion Gap: 12.7 mmol/L (ref 9.0–18.0)
BUN / Creatinine Ratio: 27.2 — ABNORMAL HIGH (ref 12.0–20.0)
BUN: 69 mg/dL — ABNORMAL HIGH (ref 8–20)
Bilirubin Total: 0.7 mg/dL (ref 0.10–1.70)
CO2 - Carbon Dioxide: 20 mmol/L (ref 17–27)
Calcium: 7.2 mg/dL — ABNORMAL LOW (ref 8.6–10.6)
Chloride: 111.3 mmol/L — ABNORMAL HIGH (ref 101.0–111.0)
Creatinine: 2.54 mg/dL — ABNORMAL HIGH (ref 0.60–1.30)
Glomerular Filtration Rate Estimate (Female): 20 mL/min/{1.73_m2} — ABNORMAL LOW (ref >=60)
Glucose: 116 mg/dL — ABNORMAL HIGH (ref 74–106)
Osmolality Calculation: 307.9 mOsm/kg — ABNORMAL HIGH (ref 275.0–300.0)
Potassium: 3.6 mmol/L (ref 3.50–5.10)
Protein Total: 5.1 g/dL — ABNORMAL LOW (ref 6.4–8.2)
Sodium: 144 mmol/L (ref 135–145)

## 2022-03-10 LAB — CELLAVISION DIFFERENTIAL
Basophils %: 0 % (ref 0–2)
Basophils, Absolute: 0 10*3/ÂµL (ref 0.0–0.1)
Eosinophils %: 0 % (ref 0–5)
Eosinophils, Absolute: 0 10*3/ÂµL (ref 0.0–0.4)
Lymphocytes %: 4 % — ABNORMAL LOW (ref 20–40)
Lymphocytes, Absolute: 0.1 10*3/ÂµL — ABNORMAL LOW (ref 1.0–4.0)
Monocytes %: 1 % — ABNORMAL LOW (ref 2–12)
Monocytes, Absolute: 0 10*3/ÂµL — ABNORMAL LOW (ref 0.2–1.0)
Neutrophils %: 95 % — ABNORMAL HIGH (ref 50–74)
Neutrophils, Absolute: 2.8 10*3/ÂµL (ref 2.0–7.4)
Platelet Estimate: DECREASED — AB
Total Cells Counted: 100
WBC: 2.9 10*3/ÂµL — ABNORMAL LOW (ref 4.5–11.0)

## 2022-03-10 LAB — TROPONIN T
Troponin T: 0.37 ng/mL (ref 0.00–0.01)
Troponin T: 0.37 ng/mL (ref 0.00–0.01)

## 2022-03-10 LAB — MAGNESIUM: Magnesium: 1.7 mg/dL (ref 1.3–2.5)

## 2022-03-10 LAB — BLOOD CULTURE
Blood Culture Result: NO GROWTH
Blood Culture Result: NO GROWTH

## 2022-03-10 LAB — CK: CK (Creatine Kinase): 2735 IU/L — ABNORMAL HIGH (ref 26–192)

## 2022-03-10 LAB — VANCOMYCIN, TROUGH: Vancomycin, Trough: 6.5 ug/mL — ABNORMAL LOW (ref 15.0–19.9)

## 2022-03-10 LAB — PHOSPHORUS: Phosphorus: 3.6 mg/dL (ref 2.5–4.7)

## 2022-03-10 MED ORDER — ceFAZolin (ANCEF) 2 g in D5W IVPB Premix
2 | Freq: Three times a day (TID) | INTRAVENOUS | Status: DC
Start: 2022-03-10 — End: 2022-03-10

## 2022-03-10 MED ORDER — furosemide (LASIX) injection 20 mg
10 | Freq: Once | INTRAMUSCULAR | Status: AC
Start: 2022-03-10 — End: 2022-03-10
  Administered 2022-03-10: 18:00:00 10 mg via INTRAVENOUS

## 2022-03-10 MED ORDER — adenosine (ADENOCARD) 3 mg/mL injection
3 | INTRAVENOUS | Status: AC
Start: 2022-03-10 — End: 2022-03-10

## 2022-03-10 MED ORDER — vancomycin (VANCOCIN) 1,500 mg in sodium chloride 0.9 % (NS) 250 mL IVPB
1000 | INTRAVENOUS | Status: DC
Start: 2022-03-10 — End: 2022-03-10

## 2022-03-10 MED ORDER — ceFAZolin (ANCEF) 2 g in D5W IVPB Premix
2 | Freq: Three times a day (TID) | INTRAVENOUS | Status: DC
Start: 2022-03-10 — End: 2022-03-10
  Administered 2022-03-10: 22:00:00 250 g via INTRAVENOUS
  Administered 2022-03-10: 22:00:00 2 g via INTRAVENOUS

## 2022-03-10 MED ORDER — lactated Ringer's bolus 1,000 mL
Freq: Once | INTRAVENOUS | Status: AC
Start: 2022-03-10 — End: 2022-03-09
  Administered 2022-03-10 (×2): via INTRAVENOUS

## 2022-03-10 MED ORDER — sodium chloride 0.9 % (NS) for medication or blood product administration 250 mL
0.9 | INTRAVENOUS | Status: DC | PRN
Start: 2022-03-10 — End: 2022-03-18

## 2022-03-10 MED ORDER — dextrose 5 % (D5W) infusion
INTRAVENOUS | Status: DC
Start: 2022-03-10 — End: 2022-03-11
  Administered 2022-03-10 – 2022-03-11 (×7): via INTRAVENOUS

## 2022-03-10 NOTE — Consults (Signed)
Consult Note   Name: Brandy [name]  DOB: [birth year] 66 y.o.  MRN: [id number]  CSN: [id number]  ADMISSION PROVIDER: Tiney Rouge, 03/10/2022  PCP: Bretta Bang, DO    Requesting Physician  Dr Robet Leu    Reason for consultation  Possible endocarditis    HISTORY:   CHIEF COMPLAINT    Altered Mental Status    HPI    Brandy [name] is a 66 y.o. female who follows with Dr. Bretta Bang for primary care and who follows with Dr. Crissie Figures through the [facility] heart clinic for her cardiac issues.  Patient is admitted with septic shock and I been asked to see the patient regarding further management of her cardiac problems and possibility of prosthetic valve endocarditis.    Patient has an established cardiac history with the patient presenting in August 2017 with decompensated heart failure elevated cardiac markers and finding of severe LV impairment with echo suggesting critical aortic valve stenosis.  The patient was at that time seen by Dr. Crissie Figures in consultation and brought to angiography with finding of severe LV impairment ejection fraction at 30% and with critical aortic valve stenosis with a valve area calculating 0.46 cm?.  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  Repeat echo imaging a year later on 01/30/2017 showing the ventricle to be normal size with normal systolic function in the 60% range and normally functioning bioprosthesis.  Her last echocardiogram at the [facility] heart clinic was on 02/27/2020 with normal subclavicular cavity ejection fraction at 69% with mild biatrial enlargements and normally functioning aortic valve prosthesis.    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      She was admitted on 03/07/2022 with altered mental status with finding of marked hypotension and requiring aggressive fluid and pressor support.  Echocardiogram performed at bedside showed global LV impairment with ejection fraction at 40-45% with normally functioning bioprosthetic valve and without any clear evidence of valvular endocarditis.  Patient has been supported with aggressive antibiotic and pressor agents.  This morning she was noted to have a prolonged run of narrow complex tachycardia which self terminated.  Cardiac markers were drawn and were found to be elevated.  The patient herself denies any chest discomfort or heaviness that would suggest angina and she herself does not recognize any palpitations or racing sensations.  Patient tells me she feels profoundly weak.        PAST MEDICAL HISTORY    Past Medical History:   Diagnosis Date   . Arrhythmia    . CHF (congestive heart failure) (HCC)    . Colon cancer (HCC) 12/28/2016   . Colonic mass 11/28/2016    POSTOPERATIVE DIAGNOSIS 11/28/16 colonoscopy:  1) Colonoscopy with 3/4 circumference apple core lesion at full insertion of the colonoscope but likely distal to the Hepatic flexure 2) Hemorrhoids: mild internal 3) Mass: hot forceps biopsied times two without complications and tattooed times two proximal to the mass  Biopsy suspicious for intramucosal adenocarcinoma. Referral to Dr Oran Rein done and she   . Encounter for screening colonoscopy 12/05/2017   . History of blood transfusion    . HIT (heparin-induced thrombocytopenia) (HCC)    . Hyperlipidemia    .  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 Screening for colon cancer 10/04/2016    POSTOPERATIVE DIAGNOSIS 11/28/16 colonoscopy:  1) Colonoscopy with 3/4 circumference apple core lesion at full insertion of the colonoscope but likely distal to the Hepatic flexure 2) Hemorrhoids: mild internal 3) Mass: hot  forceps biopsied times two without complications and tattooed times two proximal to the mass  Biopsy suspicious for intramucosal adenocarcinoma. Referral to Dr Oran Rein done and she   . Thyroid disease     Hyperparathyroid       SURGICAL HISTORY    Past Surgical History:   Procedure Laterality Date   . CARDIAC SURGERY  01/23/2016    AVR   . COLON SURGERY  2018    Hemicolectomy   . COLONOSCOPY N/A 05/04/2021    Procedure: COLONOSCOPY; W/ POSSIBLE BIOPSY & POLYPECTOMY;  Surgeon: Truitt Merle, MD;  Location: [facility] OR;  Service: [facility] Procedures;  Laterality: N/A;   . CORONARY ANGIOPLASTY     . DILATION AND CURETTAGE OF UTERUS N/A 09/16/2021    Procedure: DILATATION AND CURETTAGE; HYSTEROSCOPY with myosure;  Surgeon: Conni Elliot, MD;  Location: [facility] OR;  Service: [facility] Procedures;  Laterality: N/A;   . PROCEDURE N/A 01/27/2016    Procedure: AORTIC VALVE REPLACEMENT; INTRA AORTIC BALLOON INSERTION;  Surgeon: Otho Bellows, MD;  Location: [facility] OR;  Service: Cardiology;  Laterality: N/A;   . PROCEDURE Right 03/10/2016    Procedure: AMPUTATION TOE;  Surgeon: Chrystie Nose, DPM;  Location:  [facility] OR;  Service: Podiatry;  Laterality: Right;   . PROCEDURE N/A 11/28/2016    Procedure: COLONOSCOPY; BIOPSY;  Surgeon: Gailen Shelter, MD;  Location: [facility] OR;  Service: [facility] Procedures;  Laterality: N/A;  ink spot marking apple core mass proximal lesion distal to hepatic flexure   . PROCEDURE Right 12/28/2016    Procedure: LAPAROPSCOPIC CONVERTED TO OPEN HAND ASSISTED RIGHT HEMI COLECTOMY;  Surgeon: Truitt Merle, MD;  Location: [facility] OR;  Service: [facility] Procedures;  Laterality: Right;  Dr. Oran Rein wants 3 hours   . PROCEDURE N/A 01/15/2018    Procedure: COLONOSCOPY; W/ POSSIBLE BIOPSY & POLYPECTOMY;  Surgeon: Truitt Merle, MD;  Location: [facility] OR;  Service: [facility] Procedures;  Laterality: N/A;   . PROCEDURE N/A 03/19/2018    Procedure: PARATHYROIDECTOMY; RECURRENT LARYNGEAL NERVE MONITORING, INTRAOPERATIVE PARATHYROID HORMONE;  Surgeon: Edmund Hilda, MD;   Location: [facility] OR;  Service: Ear Nose and Throat;  Laterality: N/A;   . PROCEDURE Left 10/11/2021    Procedure: RIGHT SUBCLAVIAN PORT PLACEMENT;  Surgeon: Truitt Merle, MD;  Location: [facility] OR;  Service: [facility] Procedures;  Laterality: Left;   . TONSILLECTOMY     . TUBAL LIGATION  1980   . US GUIDED LYMPH NODE BIOPSY  09/15/2021    US GUIDED LYMPH NODE BIOPSY 09/15/2021 Jeanice Lim, MD [facility] [place] [facility]       ALLERGIES    Bee sting Julien Nordmann ext-venom-honey bee], Heparin, and Sulfa (sulfonamide antibiotics)    CURRENT MEDICATIONS    Prior to Admission medications    Medication Sig Start Date End Date Taking? Authorizing Provider   aspirin 81 MG EC tablet Take 81 mg by mouth every morning.    Historical Provider, MD   atorvastatin (LIPITOR) 20 mg tablet Take 1 tablet by mouth every night at bedtime.  Patient taking differently: Take 20 mg by mouth every night at bedtime. 07/26/21   Bretta Bang, DO   calcium carbonate/vitamin D3 (CALCIUM 600 +  D,3, ORAL) Take 1 tablet by mouth every morning.    Historical Provider, MD   carvediloL (COREG) 6.25 mg tablet Take 1 tablet by mouth 2 times daily with meals. 01/13/22   Bretta Bang, DO   cholecalciferol, vitamin D3, (VITAMIN D3) 50 mcg (2,000 unit) Tab tablet Take 2,000 units by mouth every morning.    Historical Provider, MD   dexAMETHasone (DECADRON) 4 mg tablet Take 2 tablets by mouth daily with breakfast. Take 8mg  by mouth once daily for three days starting day 2 of each cycle. 01/19/22   West Pugh, FNP   EPINEPHrine (EPIPEN) 0.3 mg/0.3 mL AtIn auto-injector Inject 0.3 mL into the muscle once as needed. 02/27/22   Bretta Bang, DO   ferrous gluconate (FERGON) 324 mg (38 mg iron) tablet Take 1 tablet by mouth daily with breakfast. 02/09/22 02/09/23  Lindley Magnus, MD   HYDROcodone-acetaminophen (NORCO) 5-325 mg per tablet Take 1 tablet by mouth every 6 hours as needed for Pain. 12/08/21   Lindley Magnus, MD   ondansetron (ZOFRAN-ODT) 8 mg disintegrating tablet Take 1 tablet by mouth  every 8 hours as needed for Nausea. 10/19/21   Lindley Magnus, MD   prochlorperazine (COMPAZINE) 10 mg tablet Take 1 tablet by mouth every 6 hours as needed. For break through nausea/vomiting. 10/19/21   Lindley Magnus, MD       FAMILY HISTORY    Family History   Problem Relation Age of Onset   . Colon cancer Mother         dx at older age   . Heart disease Father    . Heart disease Sister    . Cancer Brother    . Heart disease Maternal Aunt    . Ovarian cancer Maternal Aunt    . Cancer Maternal Uncle    . Heart disease Maternal Uncle    . Breast cancer Paternal Aunt        SOCIAL HISTORY    Social History     Tobacco Use   . Smoking status: Never     Passive exposure: Yes   . Smokeless tobacco: Never   Vaping Use   . Vaping Use: Never used   Substance Use Topics   . Alcohol use: No   . Drug use: No     Immunization History   Administered Date(s) Administered   . FLU 65+yrs QUADRIVALENT ADJUVANTED INJ(PF) 04/23/2020   . Flu 67mo-29yrs Quadrivalent Inj(PF) 02/25/2016, 03/20/2018, 04/09/2019   . Janssen Sars-cov-2 Vaccination 08/19/2019   . Moderna 18 yr+ SARS-CoV-2 monovalent booster (RED cap) 04/23/2020   . Pegfilgrastim Wearable Injector 10/20/2021, 11/10/2021, 12/08/2021, 12/29/2021   . Pfizer 12 yr+ SARS-CoV-2 bivalent vaccine (GRAY cap) 04/14/2021   . Pneumococcal Conjugate 13-Valent 07/27/2020       REVIEW OF SYSTEMS    See above    PHYSICAL EXAM:   VITAL SIGNS:                                              MOST RECENT VITAL SIGNS   Temp Blood Pressure Heart Rate Resp Rate O2 Sats   Temp: (!) 0 ?C (32 ?F) BP: (!) 132/100 Pulse: (!) 113 Resp: (!) 26   SpO2: 99 % on  L/min      Admission Weight: Weight: 228 lb (103.4 kg)       BMI: Body mass index  is 47.05 kg/m?Marland Kitchen    Exam:    General appearance: acyanotic, in no respiratory distress and chronically ill appearing.    CV exam: normal rate, regular rhythm, normal S1, S2, no murmurs, rubs, clicks or gallops. JVP could not be evaluated due to neck size    Chest: clear to  auscultation, no wheezes, rales or rhonchi, symmetric air entry.     Abdominal exam: soft, nontender, nondistended, no masses or organomegaly.    Exam of extremities: peripheral pulses normal, no pedal edema, no clubbing or cyanosis    Skin: Skin color, texture, turgor normal. No rashes or lesions.  No stigmata of endocarditis    Neuro: alert, oriented x3, affect appropriate, no focal neurological deficits, moves all extremities well and no involuntary movements        DATA:   EKG    Sinus tachycardia with frequent atrial ectopy at a rate of 118/min with a right bundle branch block and nonspecific diffuse ST-T wave abnormality  ECHO   From 03/07/22    1. Left ventricular cavity size is normal. There is mild concentric left ventricular hypertrophy. Left ventricular EF 40 - 45 %. Unable to assess diastolic function in the setting  of tachycardia.  2. Right ventricle is mildly dilated with decreased systolic function.  3. The sinuses of Valsalva appears dilated, measuring 3.4 cm. The ascending aorta appears dilated, measuring 3.8 cm.  4. 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 It is well seated and does not rock. Transvalvular gradients are within expected limits for the valve.  5. The mitral valve has mild regurgitation.  6. The tricuspid valve appears structurally normal. The tricuspid valve has trace/physiological regurgitation. The RSVP is estimated at 27-32 mmHg, which is normal.  7. Pulmonic valve appears structurally normal. The pulmonic valve has trace regurgitation.  8. No evidence of vegetation.  9. No prior study available for comparison.    CATH    STRESS TEST            LABS    Results for orders placed or performed during the hospital encounter of 03/07/22 (from the past 24 hour(s))   Comprehensive Metabolic Panel -Daily    Collection Time: 03/10/22  5:32 AM   Result Value Ref Range    Sodium 144 135 - 145 mmol/L    Potassium 3.60 3.50 - 5.10 mmol/L    Chloride 111.3 (H) 101.0 - 111.0  mmol/L    CO2 - Carbon Dioxide 20 17 - 27 mmol/L    BUN 69 (H) 8 - 20 mg/dL    Creatinine 1.61 (H) 0.60 - 1.30 mg/dL    Glucose 096 (H) 74 - 106 mg/dL    Calcium 7.2 (L) 8.6 - 10.6 mg/dL    AST - Aspartate Aminotransferase 123 (H) 5 - 32 IU/L    ALT - Alanine Aminotransferase 44 (H) 5 - 33 IU/L    Alkaline Phosphatase 69 35 - 105 IU/L    Protein Total 5.1 (L) 6.4 - 8.2 g/dL    Albumin 2.3 (L) 3.5 - 5.0 g/dL    Bilirubin Total 0.45 0.10 - 1.70 mg/dL    Anion Gap 40.9 9.0 - 18.0 mmol/L    Albumin/Globulin Ratio 0.8 (L) 1.2 - 2.2    Glomerular Filtration Rate Estimate (Female) 20 (L) >=60 mL/min/1.82m*2    GFR Additional Info      BUN / Creatinine Ratio 27.2 (H) 12.0 - 20.0    Osmolality Calculation 307.9 (H) 275.0 - 300.0 mOsm/kg  CBC with Auto Differential -Daily    Collection Time: 03/10/22  5:32 AM   Result Value Ref Range    WBC 2.9 (L) 4.5 - 11.0 10*3/?L    RBC 2.35 (L) 3.80 - 5.60 10*6/?L    Hemoglobin 7.3 (L) 11.7 - 16.5 g/dL    HCT 16.1 (L) 09.6 - 48.0 %    MCV 92.3 81.0 - 100.0 fL    MCH 31.1 28.3 - 33.3 pg    MCHC 33.7 32.5 - 36.0 g/dL    RDW 04.5 (H) 40.9 - 16.1 %    Platelet Count 31 (L) 150 - 405 10*3/?L    MPV 11.1 (H) 6.9 - 10.0 fL   Vancomycin, Trough -AM Draw    Collection Time: 03/10/22  5:32 AM   Result Value Ref Range    Vancomycin, Trough 6.5 (L) 15.0 - 19.9 ?g/mL   Cellavision Differential -Next Routine    Collection Time: 03/10/22  5:32 AM   Result Value Ref Range    WBC 2.9 (L) 4.5 - 11.0 10*3/?L    Neutrophils % 95 (H) 50 - 74 %    Lymphocytes % 4 (L) 20 - 40 %    Monocytes % 1 (L) 2 - 12 %    Eosinophils % 0 0 - 5 %    Basophils % 0 0 - 2 %    Neutrophils, Absolute 2.8 2.0 - 7.4 10*3/?L    Lymphocytes, Absolute 0.1 (L) 1.0 - 4.0 10*3/?L    Monocytes, Absolute 0.0 (L) 0.2 - 1.0 10*3/?L    Eosinophils, Absolute 0.0 0.0 - 0.4 10*3/?L    Basophils, Absolute 0.0 0.0 - 0.1 10*3/?L    Total Cells Counted 100     Platelet Estimate Decreased (A) Adequate    Vacuolated Neutrophils Present (A) (none)     Dohle Bodies Present (A) (none)    Anisocytosis 1+ (A) (none)   Phosphorus -STAT    Collection Time: 03/10/22  5:32 AM   Result Value Ref Range    Phosphorus 3.6 2.5 - 4.7 mg/dL   CK (Creatine Kinase) -Next Routine    Collection Time: 03/10/22  5:32 AM   Result Value Ref Range    CK (Creatine Kinase) 2,735 (H) 26 - 192 IU/L   Prepare RBC -: 1 units STAT    Collection Time: 03/10/22 10:58 AM   Result Value Ref Range    Blood Product Code E0332V00     Blood Product Unit ID [id number]     Blood Product Unit ABO O     Blood Product Unit Rh POS     Blood Product Unit Crossmatch Compatible     Blood Product Status Issued     Blood Product Unit Expiration [id number]     Blood Product Blood Type Barcode 5100     Blood Product Unit Volume 300 ml    Blood Product Code Description E0332 AS-1 RBC LR IRR    Magnesium -STAT    Collection Time: 03/10/22 11:18 AM   Result Value Ref Range    Magnesium 1.7 1.3 - 2.5 mg/dL   Basic Metabolic Panel -STAT    Collection Time: 03/10/22 11:18 AM   Result Value Ref Range    Sodium 146 (H) 135 - 145 mmol/L    Potassium 3.62 3.50 - 5.10 mmol/L    Chloride 112.7 (H) 101.0 - 111.0 mmol/L    CO2 - Carbon Dioxide 20 17 - 27 mmol/L    BUN 67 (H) 8 - 20  mg/dL    Creatinine 1.61 (H) 0.60 - 1.30 mg/dL    Glucose 096 (H) 74 - 106 mg/dL    Calcium 7.6 (L) 8.6 - 10.6 mg/dL    Anion Gap 04.5 9.0 - 18.0 mmol/L    Glomerular Filtration Rate Estimate (Female) 21 (L) >=60 mL/min/1.40m*2    GFR Additional Info      BUN / Creatinine Ratio 26.9 (H) 12.0 - 20.0    Osmolality Calculation 310.5 (H) 275.0 - 300.0 mOsm/kg   Troponin T -STAT    Collection Time: 03/10/22 11:18 AM   Result Value Ref Range    Troponin T 0.37 (HCrit) 0.00 - <0.01 ng/mL   Basic Metabolic Panel -Next Routine    Collection Time: 03/10/22  4:44 PM   Result Value Ref Range    Sodium 146 (H) 135 - 145 mmol/L    Potassium 3.52 3.50 - 5.10 mmol/L    Chloride 111.8 (H) 101.0 - 111.0 mmol/L    CO2 - Carbon Dioxide 20 17 - 27 mmol/L    BUN  67 (H) 8 - 20 mg/dL    Creatinine 4.09 (H) 0.60 - 1.30 mg/dL    Glucose 811 (H) 74 - 106 mg/dL    Calcium 7.6 (L) 8.6 - 10.6 mg/dL    Anion Gap 91.4 9.0 - 18.0 mmol/L    Glomerular Filtration Rate Estimate (Female) 21 (L) >=60 mL/min/1.96m*2    GFR Additional Info      BUN / Creatinine Ratio 27.6 (H) 12.0 - 20.0    Osmolality Calculation 311.8 (H) 275.0 - 300.0 mOsm/kg     IMAGING FOR LAST 24 HOURS    X-ray Chest 1 View   Final Result by Alcus Dad, MD (09/29 1136)   IMPRESSION:   1. See Above.      NOTE:   This dictation was produced using voice recognition software. Although effort has been made to minimize transcription errors, homonyms and other transcription errors may be present and may not truly reflect my intent. Please excuse any unintentional    verbiage, or grammatical errors.      Dictated by: Junious Dresser   Electronically Signed by: Junious Dresser on 03/10/2022 11:36 AM      Report ID#: [id number]            CT chest abdomen pelvis without IV contrast :   Final Result by Glenetta Hew, MD (09/26 2142)      CT head without contrast   Final Result by William Dalton, MD (09/26 2124)      Ultrasound kidney   Final Result by Alcus Dad, MD (09/26 1822)   IMPRESSION:   1. No solid mass or hydronephrosis seen.   NOTE:   This dictation was produced using voice recognition software. Although effort has been made to minimize transcription errors, homonyms and other transcription errors may be present and may not truly reflect my intent. Please excuse any unintentional    verbiage, or grammatical errors.      Dictated by: Junious Dresser   Electronically Signed by: Junious Dresser on 03/07/2022 6:22 PM      Report ID#: [id number]            Ultrasound right upper quadrant   Final Result by Alcus Dad, MD (09/26 1823)   IMPRESSION:   1. Diffuse increased echogenicity of the liver. Differential diagnosis includes, but is not limited to, fatty infiltration, hepatitis, and cirrhosis.   2. Cholelithiasis with small  floating stones in  the gallbladder lumen, as well as echogenic sludge.   NOTE:   This dictation was produced using voice recognition software. Although effort has been made to minimize transcription errors, homonyms and other transcription errors may be present and may not truly reflect my intent. Please excuse any unintentional    verbiage, or grammatical errors.      Dictated by: Junious Dresser   Electronically Signed by: Junious Dresser on 03/07/2022 6:23 PM      Report ID#: [id number]            Echo 2D complete w doppler wo contrast   Final Result by Tiney Rouge, MD (09/26 1746)      X-ray Chest 1 View   Final Result by Daria Pastures, MD (09/26 1408)   IMPRESSION:   1. See Above.      NOTE:   This dictation was produced using voice recognition software. Although effort has been made to minimize transcription errors, homonyms and other transcription errors may be present and may not truly reflect my intent. Please excuse any unintentional    verbiage, or grammatical errors.      Dictated by: Marlowe Shores   Electronically Signed by: Marlowe Shores on 03/07/2022 2:08 PM      Report ID#: [id number]            CT head without contrast   Final Result by Rogene Houston, MD (09/26 1225)   IMPRESSION:   -Trace focal subarachnoid hemorrhage noted in a sulcus overlying the right superior parietal lobe, source unclear.  Recommend clinical correlation and further evaluation with CT angiogram of the head and/or contrast-enhanced MRI of the brain, as    appropriate.   -No hydrocephalus or mass effect.   -Nonspecific subcentimeter hypodense focus in the left posterior cerebellar hemisphere, possibly chronic infarct.   -Chronic age-related changes.      Dictated by: Rogene Houston   Electronically Signed by: Rogene Houston on 03/07/2022 12:25 PM      Report ID#: [id number]                  ASSESSMENT:   DIAGNOSIS:    Mrs. Lehn is critically ill with multisystem failure in the setting of immunosuppression on chemotherapy.  Her LV impairment  is secondary to her systemic illness and her cardiac marker elevation is reflective of her tachyarrhythmias and renal impairment.  This patient has normal coronaries as evidenced by her angiogram of 2017 and I do not feel this represents any acute coronary syndrome.  I also do not feel she has valvular endocarditis as her echo was fairly reassuring and she has no clinical findings of systemic embolization.  Given her frail condition and her thrombocytopenia I would argue to treat her empirically with antibiotic therapy and avoid trans esophageal imaging unless the patient has refractory infection that does not clear with appropriate antibiotic therapy.  The patient however is likely to deteriorate into further atrial arrhythmia given her multiple medical problems and I would favor initiating antiarrhythmic therapy with amiodarone in the short-term.    It was a pleasure seeing this patient for you and I will follow her along with you until her care can be taken over by her primary cardiologist Dr. Crissie Figures.      PLAN:   See orders.  The plan was discussed with the patient and/or family.            Pertinent Labs & Imaging studies reviewed. (See chart for details)   Plan  of care was discussed with patient and/or family.    Electronically signed by Tiney Rouge, MD9/29/20235:42 PM  Cc:CC Providers:    PCP  Bretta Bang, DO  Attending  JoelVal Eagle MD  Admitting  Kathalene Frames, MD  Referring Provider  No ref. provider found

## 2022-03-10 NOTE — Plan of Care (Signed)
Patient alert throughout this shift. Patient following all commands and nodding yes/no to questions. Patient will only whisper yes or no. Patient has CVC and foley. PRN dilaudid given for pain. Patient now afebrile. Patient repositioned frequently. Call light within reach    Problem: Safety  Goal: Patient will be injury free during hospitalization  Description: Assess and monitor vitals signs, neurological status including level of consciousness and orientation. Assess patient's risk for falls and implement fall prevention plan of care and interventions per hospital policy.      Ensure arm band on, uncluttered walking paths in room, adequate room lighting, call light and overbed table within reach, bed in low position, wheels locked, side rails up per policy, and non-skid footwear provided.   Outcome: Progressing     Problem: Knowledge Deficit  Goal: Patient/family/caregiver demonstrates understanding of disease process, treatment plan, medications, and discharge instructions  Description: Complete learning assessment and assess knowledge base.  Outcome: Progressing     Problem: Risk for Falls  Goal: No falls during hospitalization  Description: Patient will not fall during hospitalization.  Outcome: Progressing

## 2022-03-10 NOTE — Progress Notes (Signed)
Patient denies shortness of breath. Patient is not currently in respiratory distress.    Patient is on BiPAP FiO2 of 25%, sats 96%    Pre tx breath sounds: clear to auscultation, no wheezes, rales, or rhonchi, diminished.    Lab Results   Component Value Date    BUN 67 (H) 03/10/2022    NA 146 (H) 03/10/2022    K 3.62 03/10/2022    CL 112.7 (H) 03/10/2022    CO2 20 03/10/2022     B-Type Natriuretic Peptide - BNP   Date Value Ref Range Status   01/25/2016 1,421 (H) 0 - 99 pg/mL Final     Lab Results   Component Value Date    WHITEBLOODCE 2.9 (L) 03/10/2022    WHITEBLOODCE 2.9 (L) 03/10/2022    HGB 7.3 (L) 03/10/2022    HCT 21.7 (L) 03/10/2022    MCV 92.3 03/10/2022    LABPLAT 31 (L) 03/10/2022       Arterial Blood Gas result:  No updated ABG, last done on 03/07/2022.   PH: 7.47, pCO2: 18, pO2: 60, HCO3: 13.2, Lactate: 1.5    Proventil PRN, not indicated at this time. BIPAP 12/8, 10, 25%.  Larry Sierras SRT

## 2022-03-10 NOTE — Progress Notes (Signed)
EKG completed. Handed to Dr. Belia Heman  03/10/2022

## 2022-03-10 NOTE — Progress Notes (Signed)
PHARMACIST PROGRESS NOTE - PHARMACY CONSULT     Brandy [name] is a 66 y.o. female.    Pharmacist monitoring assessment:    Drug Name: Unasyn  Monitoring Type: Consult  Indication: PNA  Concurrent antibiotics: Doxy (consult, see below)    Wt: 120.5 kg (265 lb 9.6 oz)   BMI:47.05 kg/m?    Cultures and Sensitivities:     9/26 - Blood: S. Aureus (pan-sensitive)              MRSA PCR: MSSA, no MRSA              Urine: Kleb pneumo     9/28 - Blood: Preliminarily NGD1    9/29 - Blood: In process    Pertinent Labs:      SCr: 2.43 mg/dL         eCrCl(AdjBW): 21.3 mL/min        eCrCl(LBW): 21.4 mL/min    Starting Date/Time of Dose: 9/29 @ 1900  Dose/Frequency: Unasyn 3g IV q12h    Dosing Notes:  Renally adjusted frequency from q6h to q12h per eCrCl between 15 to 29 mL/min.     ------    Drug Name: Doxycycline  Monitoring Type: Consult  Indication: PNA  Concurrent antibiotics: Unasyn (consult, see above)    Starting Date/Time of Dose: 9/29 @ 2000  Dose/Frequency: Doxycycline 100 mg IV BID    Dosing Notes:  No renal dose adjustments recommended, standard dosing okay to proceed as such. Once clinically improving would be appropriate for doxycycline to be PO considering excellent oral bioavailability, but elected to proceed with IV initially considering current poor clinical status.     Thank you for consulting pharmacy in the care of [facility]. Please call if you have any questions.     Maudry Diego, PharmD

## 2022-03-10 NOTE — Progress Notes (Signed)
Hospitalist Progress Note    I saw, evaluated, and discussed the patient with the Resident. I agree with the resident's findings and plan of care. Please see Resident's note for full details. Additionally, see below:      Objective:   BP 121/71 (BP Location: Left arm, Patient Position: Lying)   Pulse (!) 117   Temp (!) 38.1 ?C (100.6 ?F) (Foley)   Resp 23   Wt 265 lb 9.6 oz (120.5 kg)   LMP  (LMP Unknown)   SpO2 99%   BMI 47.05 kg/m?    Gen: critically ill, weak, speaks quietly throughcpap  CV: tachycardia, hard to hear heart soundss  Resp: cpap. Crackles. Poor airmovement  Abdo: soft, non-tende  Skin: no rashes or lesions  Ext: WWP,  edema, good pulses    Assessment:   Active Hospital Problems    Diagnosis SNOMED CT(R) Date Noted   . Subarachnoid hemorrhage (HCC) HEMORRHAGE INTO SUBARACHNOID SPACE OF NEURAXIS 03/07/2022   . Acute encephalopathy DISORDER OF BRAIN 03/07/2022   . Septic shock (HCC) SEPTIC SHOCK 03/07/2022   . Acute pyelonephritis ACUTE PYELONEPHRITIS 03/07/2022   . Acute renal failure, unspecified acute renal failure type (HCC) ACUTE KIDNEY INJURY 03/07/2022   . SAH (subarachnoid hemorrhage) (HCC) HEMORRHAGE INTO SUBARACHNOID SPACE OF NEURAXIS 03/07/2022   . Encephalopathy acute DISORDER OF BRAIN 03/07/2022   . 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  She remains febrile and tachycardic at times and  had a brief run of SVT today.    #MSSA Bacteremia: On appropriate antibiotics.  Discussed with ID.  They do not recommend removal port unless other culture returns positive at this time.  Discussed with the patient is continuing febrile they were okay with this to remain port in place unless blood cultures returned positive a second time.  Would pursue imaging if having focal pain or positive blood cultures again.  We need to image spine and head. TEE ordered for Monday. Doxy,unasyn  #Pneumonia,: transitioning to unasyn and doxycycline due to prolongue qtc.  #Urinary tract infection, on appropriate antibiotics: Acovered by unasyn  #Hypernatremia: Starting D5.  Discontinued LR.  Provided Lasix for her increased fluid status.  Up to 10 L though likely had routine losses during this time.  #SVT: Elevated troponin. Recheck. Transfusion threshold is 8-10. 1 unit ordered. Lasix ordered with blood. Trend and provided as needed. Repeat troponin (hindered by blood transfusion). Discussed with cardiology who does not recommend any anticoagulation or intervension at this time with prior reassuring cath. Patient also has history of HIT.Marland Kitchen Repeat troponin to evaluate whether this is an on going process or a resolving process. If recurrent arrythmia then will amiodarone bolus  #Severe Aortic Stenosis s/p Bioprosthetic Aortic Valve - 2017 (complicating bacteremia)  #Hyperolemia: slow diuresis.Recent Echo shows EF 40-45%. TEE on Monday  #HFrEF: new in acute septic shock. EF  40-45. Mild concentric left ventricular hypertrophy. Complicating care and making arryhtymia more likely.  #Acute Renal Failure POA. Cr normal  at baseline, 4.21 on admission.  Slowly improving  #Acute Hepatitis: slowly improving  #Acute Metabolic Encephalopathy : improved but not at baseline but remains very ill. MRI If not improving. Feel contrast would be useful but kidneys not working well.   #Coagulopathy of Sepsis, Thrombocytopenia. History of  HIT.  #Anemia: Transfused 1 unit pRBCs 9/26 and 9/29. Threshold of 16.  #Rhabdomyolysis: resolving

## 2022-03-10 NOTE — Plan of Care (Signed)
See flowsheets for assessments, interventions, and vitals. Will monitor.

## 2022-03-10 NOTE — Progress Notes (Signed)
ICU Progress Note  [facility]   Name: Brandy [name]  DOB: 1955-11-17 66 y.o.  MRN: [id number]  CSN: [id number]  [id number] Mountz, DO    Subjective:     Brandy [name] is a 66 y.o. 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    According to husband, she became weak one day prior to admission and was non-responsive the morning of admission. Found to be in multisystem failure in ED with hypotension, tachycardia, and hypoxia.     Downgraded to PCU on 9/28. Her level of consciousness and overall condition initially improved over first day of admission and then remained critically stable.    She has denied pain throughout her hospitalization.    REVIEW OF SYSTEMS:  Pertinent positives are covered above, all others negative.    Objective:     Vitals Current 24 Hour Min / Max      Temp    (!) 38.1 ?C (100.6 ?F)    Temp  Min: 0 ?C (32 ?F)  Max: 38.4 ?C (101.1 ?F)      BP     121/71     BP  Min: 111/66  Max: 145/98      HR    (!) 117    Pulse  Min: 92  Max: 194      RR    23    Resp  Min: 14  Max: 28      Sats      SpO2: 99 % on  L/min       SpO2  Min: 90 %  Max: 99 %      Weight    120.5 kg (265 lb 9.6 oz)  Body mass index is 47.05 kg/m?Marland Kitchen    Admit: 103.4 kg (228 lb)   Intake/Ouput:    Intake/Output Summary (Last 24 hours) at 03/10/2022 1914  Last data filed at 03/10/2022 1912  Gross per 24 hour   Intake 3316.7 ml   Output 1895 ml   Net 1421.7 ml      LDAs:   Triple lumen right IJ CVC  Right subclavian port  Foley  pIV x2    Scheduled Medications:  . acetaminophen (TYLENOL) suppository  650 mg Rectal Q6H   . adenosine       . ampicillin-sulbactam (UNASYN) IV  3 g Intravenous Q12H   .  doxycycline (VIBRAMYCIN) IVPB  100 mg Intravenous Q12H   . nystatin   Topical 2 times per day   . pantoprazole  40 mg Intravenous Daily   . polyvinyl alcohol-povidone(PF)  1 drop Both Eyes 4x Daily while awake       Infusions:   . amiodarone infusion 1 mg/min (03/10/22 1913)    Followed by   . [START ON 03/11/2022] amiodarone infusion     . dextrose 5 % (D5W) 100 mL/hr at 03/10/22 1540   . norepinephrine Stopped (03/08/22 0853)     PRN Medications: adenosine, albuterol, calcium carbonate, HYDROmorphone, lidocaine, LORazepam, magnesium hydroxide, naloxone (NARCAN) injection 0.04 mg, naloxone, norepinephrine, ondansetron **OR** ondansetron, polyethylene glycol, senna-docusate, sodium chloride 0.9 %, sodium chloride 0.9 %, sulfur hexafluoride    Code Status: DNR    PHYSICAL EXAMINATION:     Gen: NAD, pleasant, on responsive to questions  with nodding or shaking head.  HENT: Appears flushed. EOM intact, Conjunctiva normal. BIPAP on.  CV: Tachycardic, regular rhythm, -m/r/g  Pulm: On BIPAP, CTAB, exam limited to anterior and lateral lung fields  Abd: NT/ND, normal BS  Ext: pale, cap refill <2 sec. 2+ pitting edema in hands. Trace edema in lower limbs.      Recent Labs:   Results from last 7 days   Lab Units 03/10/22  1812 03/10/22  0532 03/09/22  0614 03/08/22  1512 03/08/22  0641 03/08/22  0000 03/07/22  1707 03/07/22  1103   WBC 10*3/?L  --  2.9*  2.9* 4.4*  4.4* 6.3  6.3 8.4  8.4 10.5  10.5  --  4.9  4.9   HGB g/dL 8.5* 7.3* 7.2* 6.9* 7.2* 7.3* 7.5* 9.1*   MCV fL  --  92.3 91.2 92.0 93.1 92.8  --  92.2   NEUTROS PCT %  --  95* 96* 95* 93* 91*  --  91*   PLATELETS 10*3/?L  --  31* 35* 42* 40* 47*  --  68*     Results from last 7 days   Lab Units 03/10/22  1644 03/10/22  1118 03/10/22  0532 03/09/22  0614 03/08/22  1512 03/08/22  0641 03/08/22  0000 03/07/22  1707 03/07/22  1103   SODIUM mmol/L 146* 146* 144 141 138 139 133* 138 137   POTASSIUM mmol/L 3.52 3.62 3.60 4.04 4.01 4.11 3.95 3.56 3.74   CHLORIDE mmol/L  111.8* 112.7* 111.3* 109.1 105.5 106.2 100.9* 103.3 100.8*   CO2 mmol/L 20 20 20  16* 15* 15* 16* 13* 17   GLUCOSE mg/dL 478* 295* 621* 308* 657* 163* 192* 150* 142*   BUN mg/dL 67* 67* 69* 78* 78* 71* 70* 63* 65*   CREATININESERUM mg/dL 8.46* 9.62* 9.52* 8.41* 3.61* 3.96* 3.85* 4.09* 4.21*   CALCIUM mg/dL 7.6* 7.6* 7.2* 6.9* 6.9* 7.1* 6.9* 7.0* 8.1*   MAGNESIUM mg/dL  --  1.7  --   --   --   --   --   --  1.6   PHOSPHORUS mg/dL  --   --  3.6  --   --   --   --   --  4.1     Results from last 7 days   Lab Units 03/10/22  0532 03/09/22  0614 03/08/22  0641 03/07/22  1103   AST IU/L 123* 180* 238* 237*   ALT IU/L 44* 55* 64* 74*   ALK PHOS IU/L 69 73 82 61   PROTEIN TOTAL g/dL 5.1* 5.3* 5.5* 7.0   ALBUMIN g/dL 2.3* 2.3* 2.6* 3.4*   A/G RATIO  0.8* 0.8* 0.9* 0.9*         Results from last 7 days   Lab Units 03/10/22  1644 03/10/22  1118   TROPONIN T ng/mL 0.37* 0.37*     Results from last 7 days   Lab Units 03/07/22  1103   INR  1.3*   PROTIME Seconds 14.4*         Recent Imaging:  X-ray Chest 1 View    Result Date: 03/10/2022  EXAMINATION: XR CHEST 1 VIEW HISTORY: svt. COMPARISON: 03/07/2022 FINDINGS: LINES/TUBES:  The following lines are seen - Central line seen placed through a right jugular approach with the distal tip at the cavoatrial junction. Central line and associated port placed through a right subclavian approach with the distal tip at the right atrium. LUNGS: 1. Patchy mixed interstitial and  airspace opacities bilaterally.  These appears slightly worse than 03/07/2022 differential diagnosis includes, but is not limited to, pulmonary edema, pneumonia, and ARDS. CARDIAC: Normal size cardiac silhouette. Sternal wires and evidence of prior mediastinal surgery. Aortic valve prosthesis is seen in place. MEDIASTINUM: No superior mediastinal widening. PLEURA: Mild blunting of the right costophrenic angle. OTHER: Negative.     IMPRESSION: 1. See Above. NOTE: This dictation was produced using voice recognition  software. Although effort has been made to minimize transcription errors, homonyms and other transcription errors may be present and may not truly reflect my intent. Please excuse any unintentional verbiage, or grammatical errors. Dictated by: Junious Dresser Electronically Signed by: Junious Dresser on 03/10/2022 11:36 AM Report ID#: [id number]     Assessment & Plan:   ASSESSMENT:  Patient Active Problem List    Diagnosis   . Subarachnoid hemorrhage (HCC)   . Acute encephalopathy   . Septic shock (HCC)   . Acute pyelonephritis   . Acute renal failure, unspecified acute renal failure type (HCC)   . SAH (subarachnoid hemorrhage) (HCC)   . Encephalopathy acute   . History of amputation of right great toe (HCC)   . Menopausal syndrome on hormone replacement therapy   . Long term (current) use of aromatase inhibitors   . HER2-positive carcinoma of breast (HCC)   . Thickened endometrium   . Malignant neoplasm of hepatic flexure (HCC)   . Axillary adenopathy   . 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 Anaphylaxis   . Back pain, thoracic   . Hyperparathyroidism (HCC)   . Colon cancer (HCC)   . Aortic valve disorder   . Heparin induced thrombocytopenia (HCC)   . Non-ischemic cardiomyopathy (HCC)   . Demand ischemia (HCC)   . Obesity   . Iron deficiency anemia   . Hilar lymphadenopathy- bilataeral    . Liver lesion- small hypodense lesion around falciform lig     Brandy [name] is a 66 y.o. 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     Her mental status is unchanged from hospital day 2 and she continues to be febrile and  tachycardic, with several minutes of SVT the morning of 9/29.        CARDIOVASCULAR:   #Severe Aortic Stenosis s/p Bioprosthetic Aortic Valve - 2017  POA. Complicates care if bacteremic. STAT ECHO shows normally functioning prosthetic valve with no signs of vegetation.  - repeat ECHO, preferably TEE 10/02     #SVT  Unknown cause, but has many risk factors. Ddx includes MI, PE, a fib, other arrhythmia, sepsis, anemia, electrolyte abnormality. History of afib. New ST depressions in V3 and V6 on EKG. Troponin of 0.37, repeat 0.37. Mg 1.7.  - Consulted cardiology. Appreciate following recommendations: Amiodarone if SVT recurs as she is likely to deteriorate into further atrial arrhythmia given extent of her poor medical condition. A cardiac catheterization is not recommended at this time since she had a clear angiogram in 2017 and her troponin elevation is better explained by her tachyarrhythmias and renal impairment than ACS. Avoid TEE unless infection is refractory to antibiotics.    #Hypervolemia  #Free Water Deficit  Fluid overloaded with pulmonary edema on  CXR 9/29. Pitting edema present on upper extremities. Net fluid status is +10L since admission. Na trending up. Maintenance fluids switched to D5W 9/29, started at 50 ml/hr  - continue to trend Na and titrate rate of D5W accordingly  - titrate furosemide based on overall fluid status  ?  #HFrEF   POA. Does not appear in exacerbation, appears to have improved following valve replacement in 2017. STAT ECHO 9/26 shows EF 40-45%.  - Holding carvedilol, atorvastatin, ASA while acutely ill. Can resume when able to swallow.     PULM:   #Acute Hypoxic Respiratory Failure   POA. No baseline O2 use. No known lung disease. Needing 3L oximask on admission. Now on BiPAP with FiO2 of 50. S/p NG to decompress stomach after bagging.  - RTDP  - keep O2 >90%    ID:   #MSSA bacteremia of unclear source  POA.Unclear source of sepsis. Hx of pan-sensitive E. Coli UTI. Urine culture  shows Klebsiella. S/p ceftriaxone, single dose 9/26. Cefepime 9/26-9/28. vancomycin 9/26-29. ceftriaxone 9/28-29. Cefazolin 9/29. Replaced foley 9/28. Repeat blood cultures 8/28: negative. Remains febrile and tachycardic.  Concern for seeding with continued fevers and tachycardia despite several days of antibiotics  - Surgery consult 9/29 for potential port removal  - ID consult 9/29: Discussed the case with Dr. Bertell Maria at [facility] in [place] who recommended continuation of antibiotics to cover MSSA, Klebsiella, and atypicals, with removal of port and further imaging only if blood cultures are positive again, with the exception of TEE, which he strongly recommends given bovine aortic valve.  - Unasyn and doxycycline to cover MSSA, Klebsiella, and atypicals  - Daily blood cultures  - Blood cultures from port collected 9/26. If positive, plan to have it removed.  - Consider TEE 03/13/22  - consider port removal if condition fails to improve and TEE negative regardless of blood culture result  - consider brain/spine MRI once renal function improves enough to use contrast    #UTI  POA. Asymptomatic. Culture grew Klebsiella covered by all antibiotic regimens used as outlined above.    #Community acquired pneumonia vs pulmonary edema  POA. R lower lobe. Repeat CXR 9/29 appears worse, most consistent with pulmonary edema given clinical presentation of hypervolemia.  - ID consult: appreciate recommendations to cover atypicals in case of pneumonia.    #Intrigenous candidal infection  - nystatin powder and Skinfold dry sheets    ENDO:   #Hyperparathyroidism s/p parathyroidectomy 2019  Complicates care. Monitor electrolytes.     RENAL:   #Acute Renal Failure   POA. Cr normal at baseline, 4.21 on admission. Renal US showed no solid mass or hydronephrosis, though CT showed calculus in bladder that may have recently passed. Creatinine improving.  - Fluid resuscitation at above   - Foley, strict I/O   - Pharmacy consulted to  review medications   ?  #Anion Gap Metabolic Acidosis (resolved)  POA. Likely from shock and renal failure. Lactate normal.     GI/FEN:   #Acute Hepatitis   POA. Likely shock liver. RUQ US shows diffuse increased echogenicity of the liver which could indicate fatty infiltration, hepatitis, or cirrhosis. LFTs are trending downward.  - Continue to trend LFTs    NEURO:   #Acute Metabolic Encephalopathy   POA. Likely due to severe sepsis, GCS initially 3. Treating the underlying causes. Intubation discussed at length with the husband, decided DNR/DNI. By 9/27 was moving spontaneously, whispering single words, and able to answer questions.   - Dilaudid PRN  for pain   ?  #Trace Non-Aneurysmal Subarachnoid Hemorrhage  POA. Seen on CT head on admission. Discussed with Neurosurgery by ED physician, gave no recommendations for management.   - Seizure precautions   - Avoid anticoagulation for 4 weeks since [facility].  - Now is likely stable enough to undergo brain MRI. Consider when kidney function improved so contrast can be used, to evaluate for metastases or other causes of encephalopathy or SAH.  ?  #Hyperthermia   POA. Temperature initially nearaly 105F. Temperature has since normalized with scheduled APAP, but then her fever returned, so scheduled APAP was resumed 9/28. She continues to be febrile despite scheduled APAP.  - Fans, limit blankets     SKIN/MSK: ?  #Pressure Injuries, Ecchymosis  POA. Multiple and scattered across back.   - Wound care consultation  - Frequent position changes     HEME/ONC:   #Coagulopathy of Sepsis, Thrombocytopenia. History of HIT.  POA. INR elevated.   - Avoid heparin     #Iron deficiency anemia + chemotherapy-induced anemia  POA. Required blood transfusions in the last month. Hgb dropped to 6.9 on 9/27. Transfused 1 unit pRBCs 9/26 and 9/29. Hgb remains stable.  - Monitor for signs of bleeding, DIC labs if seen  - Continue to trend H&H  - Hgb cutoff for transfusion is 8 given cardiac  complications  ?  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 Following with Dr. Nadeen Landau at [facility], currently on Docetaxel/Trastuzumab. Metastatic to lymph node. Last treatment 02/09/2022. Required blood transfusions due to chemotherapy.   ?  #Colon cancer s/p colectomy 2018  Complicates care, bowel regimen.     Resolved Hospital Problems  #Shock, presumed septic (resolved)  POA. Severe and refractory. Source still differentiating - query UTI or pneumonia currently. Appropriately fluid resuscitated. S/p stress dose steroids. Levophed 9/26-9/27.  - Given improvement, LR decreased to 100 ml/hr 9/28. Stopped 9/29  - Move to PCU 9/28    #Respiratory Alkalosis (resolved)  POA. Tachypneic due to sepsis/hyperthermia.    #Rhabdomyolysis (resolved)  POA. Mild. McMahon Score: high risk.  CK peaked 9/27 at 5,722 and then decreased.  - Fluid resuscitation and renal protection measures described above        Feeding: NPO  Analgesia: Dilaudid PRN  Sedation: None  Thromboprophylaxis: None - subarachnoid hemorrhage   Head of bed: 30 degrees  Ulcer prophylaxis: PPI  Glucose: 5.5% (07/27/2020); 6.0% (01/26/2016); 5.2% (01/21/2016)   Bowel regimen: PRN  I - Lines/Drains/Airway: CVC R IJ 9/26, R radial arterial line 9/26-9/28, NG tube 9/26-9/27, Foley 9/26  Deescalate antibiotics - pending culture results, source identification, ID and pharmacy recs, see plan above    CODE: DNR/DNI per multiple conversations with patient's husband    Verbal MDPOA: Husband, [name]  DISPO: PCU, guarded    Social: Lives with husband, Brandy [name], who is MDPOA        Richardean Sale   03/10/2022  7:14 PM

## 2022-03-10 NOTE — Progress Notes (Signed)
PHARMACIST - VANCOMYCIN FOLLOW UP PROGRESS NOTE     Brandy [name] isTennie Raboldmale.   56 Pharmacist monitoring assessment for vancomycin:    Vancomycin Level Results: Vanc random (9/29 @ 0532) = 6.5 mcg/mL    Dosing change:    Drug Level Type: Random    Vancomycin Level Results: Vanc random (9/29 @ 0532) = 6.5 mcg/mL    Estimated Dose/Schedule: Vancomycin 1500 mg IV q48h    Estimated Peak: 35.5 mcg/mL    Estimated Trough: 12.6 mcg/mL (eAUC/MIC: 533)    Dose changed to Dose/Frequency: Vancomycin 1500 mg IV q48h    Next Scheduled Level Date/Time: Vanco T on 10/5 @ 1330    Signed by: Lyndel Pleasure, [facility]

## 2022-03-10 NOTE — Progress Notes (Signed)
EKG completed.  Placed in chart.    Ramond Dial  03/10/2022

## 2022-03-11 ENCOUNTER — Inpatient Hospital Stay: Admit: 2022-03-11

## 2022-03-11 ENCOUNTER — Inpatient Hospital Stay

## 2022-03-11 LAB — POTASSIUM: Potassium: 3.64 mmol/L (ref 3.50–5.10)

## 2022-03-11 LAB — BLOOD CULTURE
Blood Culture Result: NO GROWTH
Blood Culture Result: NO GROWTH

## 2022-03-11 LAB — COMPREHENSIVE METABOLIC PANEL
ALT - Alanine Aminotransferase: 32 IU/L (ref 5–33)
AST - Aspartate Aminotransferase: 91 IU/L — ABNORMAL HIGH (ref 5–32)
Albumin/Globulin Ratio: 0.7 — ABNORMAL LOW (ref 1.2–2.2)
Albumin: 2.1 g/dL — ABNORMAL LOW (ref 3.5–5.0)
Alkaline Phosphatase: 67 IU/L (ref 35–105)
Anion Gap: 13.4 mmol/L (ref 9.0–18.0)
BUN / Creatinine Ratio: 28.9 — ABNORMAL HIGH (ref 12.0–20.0)
BUN: 63 mg/dL — ABNORMAL HIGH (ref 8–20)
Bilirubin Total: 0.5 mg/dL (ref 0.10–1.70)
CO2 - Carbon Dioxide: 21 mmol/L (ref 17–27)
Calcium: 7.8 mg/dL — ABNORMAL LOW (ref 8.6–10.6)
Chloride: 113.6 mmol/L — ABNORMAL HIGH (ref 101.0–111.0)
Creatinine: 2.18 mg/dL — ABNORMAL HIGH (ref 0.60–1.30)
Glomerular Filtration Rate Estimate (Female): 24 mL/min/{1.73_m2} — ABNORMAL LOW (ref >=60)
Glucose: 131 mg/dL — ABNORMAL HIGH (ref 74–106)
Osmolality Calculation: 314.1 mOsm/kg — ABNORMAL HIGH (ref 275.0–300.0)
Potassium: 3.29 mmol/L — ABNORMAL LOW (ref 3.50–5.10)
Protein Total: 5.2 g/dL — ABNORMAL LOW (ref 6.4–8.2)
Sodium: 148 mmol/L — ABNORMAL HIGH (ref 135–145)

## 2022-03-11 LAB — CELLAVISION DIFFERENTIAL
Basophils %: 0 % (ref 0–2)
Basophils, Absolute: 0 10*3/ÂµL (ref 0.0–0.1)
Eosinophils %: 0 % (ref 0–5)
Eosinophils, Absolute: 0 10*3/ÂµL (ref 0.0–0.4)
Lymphocytes %: 12 % — ABNORMAL LOW (ref 20–40)
Lymphocytes, Absolute: 0.2 10*3/ÂµL — ABNORMAL LOW (ref 1.0–4.0)
Monocytes %: 1 % — ABNORMAL LOW (ref 2–12)
Monocytes, Absolute: 0 10*3/ÂµL — ABNORMAL LOW (ref 0.2–1.0)
Neutrophils %: 87 % — ABNORMAL HIGH (ref 50–74)
Neutrophils, Absolute: 1.5 10*3/ÂµL — ABNORMAL LOW (ref 2.0–7.4)
Platelet Estimate: DECREASED — AB
Total Cells Counted: 100
WBC: 1.7 10*3/ÂµL — ABNORMAL LOW (ref 4.5–11.0)

## 2022-03-11 LAB — HEMOGLOBIN AND HEMATOCRIT, BLOOD
HCT: 25.3 % — ABNORMAL LOW (ref 35.0–48.0)
HCT: 26.6 % — ABNORMAL LOW (ref 35.0–48.0)
Hemoglobin: 8.5 g/dL — ABNORMAL LOW (ref 11.7–16.5)
Hemoglobin: 8.9 g/dL — ABNORMAL LOW (ref 11.7–16.5)

## 2022-03-11 LAB — CBC WITH AUTO DIFFERENTIAL
HCT: 22.9 % — ABNORMAL LOW (ref 35.0–48.0)
Hemoglobin: 7.6 g/dL — ABNORMAL LOW (ref 11.7–16.5)
MCH: 30.4 pg (ref 28.3–33.3)
MCHC: 33.1 g/dL (ref 32.5–36.0)
MCV: 91.8 fL (ref 81.0–100.0)
MPV: 10.5 fL — ABNORMAL HIGH (ref 6.9–10.0)
Platelet Count: 33 10*3/ÂµL — ABNORMAL LOW (ref 150–405)
RBC: 2.49 10*6/ÂµL — ABNORMAL LOW (ref 3.80–5.60)
RDW: 19.9 % — ABNORMAL HIGH (ref 11.7–16.1)
WBC: 1.7 10*3/ÂµL — ABNORMAL LOW (ref 4.5–11.0)

## 2022-03-11 LAB — CATHETER TIP CULTURE

## 2022-03-11 MED ORDER — pantoprazole (PROTONIX) 40 mg in 100 mL SNAP IVPB
Freq: Every day | INTRAMUSCULAR | Status: DC
Start: 2022-03-11 — End: 2022-03-11

## 2022-03-11 MED ORDER — amiodarone in iso-osm dextrose (NEXTERONE) 150 mg/100 mL (1.5 mg/mL) infusion premix 300 mg
150 | Freq: Once | INTRAVENOUS | Status: AC
Start: 2022-03-11 — End: 2022-03-10
  Administered 2022-03-11: 02:00:00 150 mg via INTRAVENOUS
  Administered 2022-03-11: 02:00:00 1501001.5 mg via INTRAVENOUS

## 2022-03-11 MED ORDER — propofoL (DIPRIVAN) injection
10 | INTRAVENOUS | Status: DC | PRN
Start: 2022-03-11 — End: 2022-03-11
  Administered 2022-03-11 (×3): 10 mg/mL via INTRAVENOUS

## 2022-03-11 MED ORDER — amiodarone in iso-osm dextrose (NEXTERONE) 360 mg/200 mL (1.8 mg/mL) infusion premix
360 | INTRAVENOUS | Status: AC
Start: 2022-03-11 — End: 2022-03-11
  Administered 2022-03-11: 02:00:00 360 mg/min via INTRAVENOUS
  Administered 2022-03-11: 08:00:00 3602001.8 mg/min via INTRAVENOUS

## 2022-03-11 MED ORDER — lidocaine 10 mg/mL (1 %) solution
10 | INTRAMUSCULAR | Status: AC
Start: 2022-03-11 — End: ?

## 2022-03-11 MED ORDER — ampicillin-sulbactam (UNASYN) 3 g in 100 mL 0.9 % NaCl SNAP IVPB
Freq: Two times a day (BID) | INTRAMUSCULAR | Status: DC
Start: 2022-03-11 — End: 2022-03-14
  Administered 2022-03-11 – 2022-03-14 (×16): via INTRAVENOUS

## 2022-03-11 MED ORDER — furosemide (LASIX) injection 20 mg
10 | INTRAMUSCULAR | Status: DC
Start: 2022-03-11 — End: 2022-03-11
  Administered 2022-03-11: 19:00:00 10 mg via INTRAVENOUS

## 2022-03-11 MED ORDER — sodium chloride 0.9 % (NS) for medication or blood product administration 250 mL
0.9 | INTRAVENOUS | Status: DC | PRN
Start: 2022-03-11 — End: 2022-03-18

## 2022-03-11 MED ORDER — doxycycline (VIBRAMYCIN) 100 mg in dextrose 5 % (D5W) 100 mL IVPB
100 | Freq: Two times a day (BID) | INTRAVENOUS | Status: DC
Start: 2022-03-11 — End: 2022-03-14
  Administered 2022-03-12 – 2022-03-14 (×12): via INTRAVENOUS

## 2022-03-11 MED ORDER — ketamine (KETALAR) injection
50 | INTRAVENOUS | Status: DC | PRN
Start: 2022-03-11 — End: 2022-03-11
  Administered 2022-03-11: 21:00:00 50 mg/mL (1 mL) via INTRAVENOUS

## 2022-03-11 MED ORDER — sodium chloride 0.9 % (NS) infusion
INTRAVENOUS | Status: DC | PRN
Start: 2022-03-11 — End: 2022-03-11
  Administered 2022-03-11 (×2): via INTRAVENOUS

## 2022-03-11 MED ORDER — potassium chloride IVPB 20 mEq/100 mL
20 | Freq: Once | INTRAVENOUS | Status: AC
Start: 2022-03-11 — End: 2022-03-11
  Administered 2022-03-11: 20:00:00 20 meq via INTRAVENOUS
  Administered 2022-03-11: 22:00:00 20100 meq via INTRAVENOUS

## 2022-03-11 MED ORDER — doxycycline (VIBRAMYCIN) 100 mg in 100 mL SNAP IVPB
100 | Freq: Two times a day (BID) | INTRAMUSCULAR | Status: DC
Start: 2022-03-11 — End: 2022-03-11
  Administered 2022-03-11 (×4): 100 mg via INTRAVENOUS

## 2022-03-11 MED ORDER — potassium chloride IVPB 20 mEq/100 mL
20 | Freq: Once | INTRAVENOUS | Status: AC
Start: 2022-03-11 — End: 2022-03-11
  Administered 2022-03-11: 19:00:00 20 meq via INTRAVENOUS
  Administered 2022-03-11: 20:00:00 20100 meq via INTRAVENOUS

## 2022-03-11 MED ORDER — furosemide (LASIX) injection 40 mg
10 | INTRAMUSCULAR | Status: DC
Start: 2022-03-11 — End: 2022-03-13
  Administered 2022-03-12 – 2022-03-13 (×4): 10 mg via INTRAVENOUS

## 2022-03-11 MED ORDER — lidocaine 2 % jelly in applicator
2 | Status: DC | PRN
Start: 2022-03-11 — End: 2022-03-22

## 2022-03-11 MED ORDER — pantoprazole (PROTONIX) 40 mg in dextrose 5 % (D5W) 100 mL IVPB
40 | Freq: Every day | INTRAVENOUS | Status: DC
Start: 2022-03-11 — End: 2022-03-15
  Administered 2022-03-12 – 2022-03-14 (×6): via INTRAVENOUS

## 2022-03-11 MED ORDER — amiodarone in iso-osm dextrose (NEXTERONE) 360 mg/200 mL (1.8 mg/mL) infusion premix
360 | INTRAVENOUS | Status: AC
Start: 2022-03-11 — End: 2022-03-11
  Administered 2022-03-11: 17:00:00 3602001.8 mg/min via INTRAVENOUS
  Administered 2022-03-11 (×2): 360 mg/min via INTRAVENOUS
  Administered 2022-03-12: 04:00:00 3602001.8 mg/min via INTRAVENOUS

## 2022-03-11 NOTE — Anesthesia Post-Procedure Evaluation (Signed)
Patient Name: Brandy [name]  Procedures performed: Procedure(s):  INFUSION CATHETER Removal    Last Vitals:   Vitals Value Taken Time   BP 129/88 03/11/22 1422   Pulse 93 03/11/22 1422   Resp 19 03/11/22 1422   SpO2 98 % 03/11/22 1422   Temp 37.2 ?C 03/11/22 1422       Planned Anesthesia Type: MAC  Final Anesthesia Type: MAC  Patients Current Location: PACU  Level of Consciousness: awake  Post Procedure Pain:adequate analgesia and pain being addressed  Airway: Patent  Respiratory Status: BIPAP (consistent w/ pre-procedure baseline)  Cardio Status: hemodynamically stable  Hydration: adequately hydrated  PONV prophylaxis Ordered  PONV? NO  no anesthesia complication,       planned opioid use     multimodal analgesia used between 6 hours prior to anesthesia start to PACU discharge  perioperative obstructive sleep apnea interventions applied    The patient was able to participate in the post op evaluation    Comments:      Earney Navy, MD  2:47 PM

## 2022-03-11 NOTE — Op Note (Signed)
DATE OF SURGERY: 03/11/2022  PATIENT'S AGE: 66  SURGEON: Theresia Pree A. Broadus [name], MD  ASSISTANT:  RN.  ANESTHESIOLOGISTAlbertina Senegal. O'Dea, MD.  ANESTHESIA:  Conscious sedation.    PREOPERATIVE DIAGNOSES:  Methicillin-sensitive Staphylococcus aureus   bacteremia and septic shock, indwelling Port-A-Cath, immunocompromised -   on chemotherapy for breast cancer.    POSTOPERATIVE DIAGNOSES:  Methicillin-sensitive Staphylococcus aureus   bacteremia and septic shock, indwelling Port-A-Cath, immunocompromised -   on chemotherapy for breast cancer.    OPERATION:  Removal of right chest Port-A-Cath with catheter tip culture.    COMPLICATIONS:  None.    ESTIMATED BLOOD LOSS:  Minimal.    The patient remained in the ICU.  Final consents were signed, the right   chest was sterilely prepped and draped after anesthesia gave IV conscious   sedation.  She remained on her BiPAP.  I infiltrated 1% lidocaine over the  Port-A-Cath, then made a 2-3 cm horizontal incision.  I carried down to   the Port-A-Cath, used a scalpel to open the sheath and dissected the   subcutaneous tissue.  Then, holding up the Port-A-Cath, I cut the   triangulating sutures and removed the catheter without incident, sending   the tip for culture in a sterile container.    I infiltrated the remainder of the local anesthetic, made sure we had   hemostasis and closed in several layers of suture, applied Steri-Strips   and placed a sterile dressing.    The patient remained stable in a 30-40 degree head up position.    Justine Dines A. Broadus [name], MD  D: 03/11/2022 15:22  T: 03/11/2022 20:03  J/R: [id number]  MIRRA / LESLD

## 2022-03-11 NOTE — Progress Notes (Signed)
RT called to rm by RN due to pt desating. Pt sating in 40's on 10L oxymask. RN had already suctioned as pt was coughing a lot. Pt placed back on BiPAP and suctioned some more and got hard thick chunk on dark brown that could have been a blood clot or a mucus plug. MD notified.

## 2022-03-11 NOTE — Progress Notes (Signed)
ICU Progress Note  [facility]   Name: Brandy [name]  DOB: 07-Nov-1955 66 y.o.  MRN: [id number]  CSN: [id number]  NFA:OZHYQ Mountz, DO    Subjective:     Brandy [name] is a 66 y.o. 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    Interim events: Post-transfusion H&H with improved Hgb to 8.5. Dr. Marcene Brawn graciously evaluated the patient and recommended initiation of amiodarone to prevent further arrythmias. +BM.  Brandy [name] continues to respond yes/no to questions. Ice chips were attempted overnight, patient coughed and did not tolerate.     REVIEW OF SYSTEMS:  Unable to assess due to critical condition.     Objective:     Vitals Current 24 Hour Min / Max      Temp    37.5 ?C (99.5 ?F)    Temp  Min: 0 ?C (32 ?F)  Max: 38.4 ?C (101.1 ?F)      BP     152/85     BP  Min: 111/66  Max: 152/85      HR    90    Pulse  Min: 90  Max: 194      RR    21    Resp  Min: 14  Max: 28      Sats      SpO2: 96 % on  L/min       SpO2  Min: 90 %  Max: 99 %      Weight    265 lb 9.6 oz (120.5 kg)  Body mass index is 47.05 kg/m?Marland Kitchen    Admit: 228 lb (103.4 kg)   Intake/Ouput:    Intake/Output Summary (Last 24 hours) at 03/11/2022 0718  Last data filed at 03/11/2022 6578  Gross per 24 hour   Intake 1764.7 ml   Output 1875 ml   Net -110.3 ml      LDAs:   Triple lumen right IJ CVC  Right subclavian port  Foley  pIV x2    Scheduled Medications:  . acetaminophen (TYLENOL) suppository  650 mg Rectal Q6H   . ampicillin-sulbactam (UNASYN) IV  3 g Intravenous Q12H   . doxycycline (VIBRAMYCIN) IVPB  100 mg Intravenous Q12H   . nystatin   Topical 2 times per day   . pantoprazole  40 mg Intravenous Daily   .  polyvinyl alcohol-povidone(PF)  1 drop Both Eyes 4x Daily while awake     Infusions:   . amiodarone infusion 0.5 mg/min (03/11/22 0109)   . dextrose 5 % (D5W) 100 mL/hr at 03/11/22 0220   . norepinephrine Stopped (03/08/22 0853)     PRN Medications: albuterol, calcium carbonate, HYDROmorphone, lidocaine, LORazepam, magnesium hydroxide, naloxone (NARCAN) injection 0.04 mg, naloxone, norepinephrine, ondansetron **OR** ondansetron, polyethylene glycol, senna-docusate, sodium chloride 0.9 %, sodium chloride 0.9 %, sulfur hexafluoride    Code Status: DNR    PHYSICAL EXAMINATION:   Gen: NAD, pleasant, on responsive to questions with nodding or shaking head. Attempted to whisper something, but Bipap in place  HENT: EOM intact, Conjunctiva normal. BIPAP on.  CV: Tachycardic, regular rhythm, -m/r/g  Pulm: On BIPAP, CTAB  Abd: NT/ND, normal BS  Ext: pale, cap refill <2 sec. 2+ pitting edema in hands. Trace edema  in lower limbs.    Recent Labs:   Results from last 7 days   Lab Units 03/10/22  1812 03/10/22  0532 03/09/22  0614 03/08/22  1512 03/08/22  0641 03/08/22  0000 03/07/22  1707 03/07/22  1103   WBC 10*3/?L  --  2.9*  2.9* 4.4*  4.4* 6.3  6.3 8.4  8.4 10.5  10.5  --  4.9  4.9   HGB g/dL 8.5* 7.3* 7.2* 6.9* 7.2* 7.3* 7.5* 9.1*   MCV fL  --  92.3 91.2 92.0 93.1 92.8  --  92.2   NEUTROS PCT %  --  95* 96* 95* 93* 91*  --  91*   PLATELETS 10*3/?L  --  31* 35* 42* 40* 47*  --  68*     Results from last 7 days   Lab Units 03/10/22  1644 03/10/22  1118 03/10/22  0532 03/09/22  0614 03/08/22  1512 03/08/22  0641 03/08/22  0000 03/07/22  1707 03/07/22  1103   SODIUM mmol/L 146* 146* 144 141 138 139 133* 138 137   POTASSIUM mmol/L 3.52 3.62 3.60 4.04 4.01 4.11 3.95 3.56 3.74   CHLORIDE mmol/L 111.8* 112.7* 111.3* 109.1 105.5 106.2 100.9* 103.3 100.8*   CO2 mmol/L 20 20 20  16* 15* 15* 16* 13* 17   GLUCOSE mg/dL 981* 191* 478* 295* 621* 163* 192* 150* 142*   BUN mg/dL 67* 67* 69* 78* 78* 71* 70* 63* 65*   CREATININESERUM  mg/dL 3.08* 6.57* 8.46* 9.62* 3.61* 3.96* 3.85* 4.09* 4.21*   CALCIUM mg/dL 7.6* 7.6* 7.2* 6.9* 6.9* 7.1* 6.9* 7.0* 8.1*   MAGNESIUM mg/dL  --  1.7  --   --   --   --   --   --  1.6   PHOSPHORUS mg/dL  --   --  3.6  --   --   --   --   --  4.1     Results from last 7 days   Lab Units 03/10/22  0532 03/09/22  0614 03/08/22  0641 03/07/22  1103   AST IU/L 123* 180* 238* 237*   ALT IU/L 44* 55* 64* 74*   ALK PHOS IU/L 69 73 82 61   PROTEIN TOTAL g/dL 5.1* 5.3* 5.5* 7.0   ALBUMIN g/dL 2.3* 2.3* 2.6* 3.4*   A/G RATIO  0.8* 0.8* 0.9* 0.9*         Results from last 7 days   Lab Units 03/10/22  1644 03/10/22  1118   TROPONIN T ng/mL 0.37* 0.37*     Results from last 7 days   Lab Units 03/07/22  1103   INR  1.3*   PROTIME Seconds 14.4*         Recent Imaging:  X-ray Chest 1 View    Result Date: 03/10/2022  EXAMINATION: XR CHEST 1 VIEW HISTORY: svt. COMPARISON: 03/07/2022 FINDINGS: LINES/TUBES:  The following lines are seen - Central line seen placed through a right jugular approach with the distal tip at the cavoatrial junction. Central line and associated port placed through a right subclavian approach with the distal tip at the right atrium. LUNGS: 1. Patchy mixed interstitial and airspace opacities bilaterally.  These appears slightly worse than 03/07/2022 differential diagnosis includes, but is not limited to, pulmonary edema, pneumonia, and ARDS. CARDIAC: Normal size cardiac silhouette. Sternal wires and evidence of prior mediastinal surgery. Aortic valve prosthesis is seen in place. MEDIASTINUM: No superior mediastinal widening. PLEURA: Mild blunting of the right costophrenic  angle. OTHER: Negative.     IMPRESSION: 1. See Above. NOTE: This dictation was produced using voice recognition software. Although effort has been made to minimize transcription errors, homonyms and other transcription errors may be present and may not truly reflect my intent. Please excuse any unintentional verbiage, or grammatical errors.  Dictated by: Junious Dresser Electronically Signed by: Junious Dresser on 03/10/2022 11:36 AM Report ID#: [id number]     Assessment & Plan:   ASSESSMENT:  Patient Active Problem List    Diagnosis   . Subarachnoid hemorrhage (HCC)   . Acute encephalopathy   . Septic shock (HCC)   . Acute pyelonephritis   . Acute renal failure, unspecified acute renal failure type (HCC)   . SAH (subarachnoid hemorrhage) (HCC)   . Encephalopathy acute   . History of amputation of right great toe (HCC)   . Menopausal syndrome on hormone replacement therapy   . Long term (current) use of aromatase inhibitors   . HER2-positive carcinoma of breast (HCC)   . Thickened endometrium   . Malignant neoplasm of hepatic flexure (HCC)   . Axillary adenopathy   . 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 Anaphylaxis   . Back pain, thoracic   . Hyperparathyroidism (HCC)   . Colon cancer (HCC)   . Aortic valve disorder   . Heparin induced thrombocytopenia (HCC)   . Non-ischemic cardiomyopathy (HCC)   . Demand ischemia (HCC)   . Obesity   . Iron deficiency anemia   . Hilar lymphadenopathy- bilataeral    . Liver lesion- small hypodense lesion around falciform lig     Brandy [name] is a 66 y.o. 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     CARDIOVASCULAR:   #Severe Aortic Stenosis s/p Bioprosthetic Aortic Valve - 2017  POA. Complicates care if bacteremic. STAT ECHO shows normally functioning prosthetic valve with no signs of vegetation.  - Repeat ECHO, preferably TEE 10/02     #Supraventricular Tacycardia  Developed on 9/29, lasted a few  minutes and terminated without intervention. Ddx includes MI, PE, a fib, other arrhythmia, sepsis, anemia, electrolyte abnormality. History of afib. New ST depressions in V3 and V6 on EKG. Troponin of 0.37 > 0.37. Dr. Marcene Brawn consulted, greatly appreciate recommendations.   - Amiodarone bolus and gtt  ?  #HFrEF   POA. Does not appear in exacerbation, appears to have improved following valve replacement in 2017. STAT ECHO 9/26 shows EF 40-45%.  - Holding carvedilol, atorvastatin, ASA while acutely ill. Can resume when able to swallow.     PULM:   #Acute Hypoxic Respiratory Failure   POA. No baseline O2 use. No known lung disease. S/p NG to decompress stomach after bagging by EMS.  - RTDP    ID:   #MSSA bacteremia of unclear source  POA. Ongoing fevers as of 9/29. Blood cultures again positive 9/28. ID consult 9/29: Discussed the case with Dr. Bertell Maria at [facility] in [place] who recommended continuation of antibiotics to cover MSSA, Klebsiella, and atypicals, with removal of port and further imaging only if blood cultures are positive again, with the exception of TEE, which he strongly recommends given bovine aortic valve. Antibiotic  course:   - Ceftriaxone, 9/26, 9/28-29  - Cefepime 9/26-9/28  - Vancomycin 9/26-29  - Cefazolin 9/29  - Unasyn 9/29 -   - Doxycycline 9/29 -   - Post will need to be removed - Dr. Broadus [name] consulted   - Daily blood cultures  - TEE 03/13/22    #UTI  POA. Asymptomatic. Culture grew Klebsiella pan-sensitive. For source control, replaced foley 9/28.    #Community acquired pneumonia   POA. R lower lobe. Repeat CXR 9/29 appears worse, most consistent with pulmonary edema given clinical presentation of hypervolemia.  - ID consult: appreciate recommendations to cover atypicals in case of pneumonia.    #Intrigenous candidal infection  - nystatin powder and Skinfold dry sheets    ENDO:   #Hyperparathyroidism s/p parathyroidectomy 2019  Complicates care. Monitor electrolytes.     RENAL:   #Acute Renal  Failure   POA. Cr normal at baseline, 4.21 on admission. Renal US showed no solid mass or hydronephrosis, though CT showed calculus in bladder that may have recently passed. Creatinine improving s/p fluid resuscitation.   - Foley, strict I/O   - Pharmacy consulted to review medications     GI/FEN:   #Acute Hepatitis   POA. Likely shock liver. RUQ US shows diffuse increased echogenicity of the liver which could indicate fatty infiltration, hepatitis, or cirrhosis. LFTs are trending downward.    #Hypervolemia  #Free Water Deficit  Fluid overloaded with pulmonary edema on CXR 9/29. Pitting edema present on upper extremities. Net fluid status is +10L since admission. Na trending up. Maintenance fluids switched to D5W 9/29.  - Continue to trend Na and titrate rate of D5W accordingly  - Titrating furosemide    NEURO:   #Acute Metabolic Encephalopathy   POA. Likely due to severe sepsis, GCS initially 3. Treating the underlying causes. Intubation discussed at length with the husband, decided DNR/DNI. By 9/27 was moving spontaneously, whispering single words, and able to answer questions, but this has not improved.   - Dilaudid PRN for pain   ?  #Trace Non-Aneurysmal Subarachnoid Hemorrhage  POA. Seen on CT head on admission. Discussed with Neurosurgery by ED physician, gave no recommendations for management.   - Seizure precautions   - Avoid anticoagulation for 4 weeks   - Now is likely stable enough to undergo brain MRI. Consider when kidney function improved so contrast can be used, to evaluate for metastases or other causes of encephalopathy or SAH    SKIN/MSK: ?  #Pressure Injuries, Ecchymosis  POA. Multiple and scattered across back.   - Wound care consultation  - Frequent position changes     HEME/ONC:   #Pancytopenia   #Hx HIT  #Coagulopathy of Sepsis  Developed over hospital course. At baseline, has iron deficiency anemia and chemotherapy-induced anemia. Hgb intermittently dropped, requiring blood transfusions, now  s/p 3upRBCs. On 9/30, ANC 1500.   - Neutropenic precautions  - Continue to trend H&H  - TT Hgb <8  - Avoid heparin   - Monitor for bleeding   ?  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 Following with Dr. Nadeen Landau at [name], currently on Docetaxel/Trastuzumab. Metastatic to lymph node. Last treatment 02/09/2022. Required blood transfusions due to chemotherapy.   ?  #Colon cancer s/p colectomy 2018  Complicates care, bowel regimen.     Resolved Hospital Problems  #Septic Shock: Severe and refractory. Appropriately fluid resuscitated. S/p stress dose steroids. Levophed 9/26-9/27.  #Respiratory Alkalosis: Tachypneic due to sepsis/hyperthermia.  #Rhabdomyolysis: Mild. Rayvon Char  Score: high risk. Fluid resuscitated. CK peaked 9/27 at 5,722 and then decreased.  #Anion Gap Metabolic Acidosis: POA. Likely from shock and renal failure. Lactate normal.   #Hyperthermia: Temperature initially nearly 105F, likely contributed to rhabdomyolysis. She was cooled using fans, IVF, APAP.      Feeding: NPO - as of 10/2, will need consultation for TPN or NG/feeding tube   Analgesia: Dilaudid PRN  Sedation: None  Thromboprophylaxis: None - subarachnoid hemorrhage   Head of bed: 30 degrees  Ulcer prophylaxis: PPI  Glucose: 5.5% (07/27/2020); 6.0% (01/26/2016); 5.2% (01/21/2016)   Bowel regimen: PRN  I - Lines/Drains/Airway: CVC R IJ 9/26, R radial arterial line 9/26-9/28, NG tube 9/26-9/27, Foley 9/26  Deescalate antibiotics - ongoing and complex course, see above    CODE: DNR/DNI per multiple conversations with patient's husband    Verbal MDPOA: Husband, [name]  DISPO: PCU, guarded    Social: Lives with husbJonny Ruiznd, [name], who is MDPOA      Corinna Gab   03/11/2022  7:18 AM

## 2022-03-11 NOTE — Anesthesia Pre-Procedure Evaluation (Signed)
Anesthesia Evaluation     Patient summary reviewed and Nursing notes reviewed    No history of anesthetic complications     Airway   Dental      Pulmonary - negative ROS   Cardiovascular   (+) hypertension, valvular problems/murmurs (s/p aortic valve replacement), dysrhythmias, CHF,     Neuro/Psych - negative ROS     GI/Hepatic/Renal    (+) chronic renal disease ARF,     Endo/Other  (+) , cancer (hx colon cancer s/p hemicolectomy 2018, recent breast cancer w/ port in place for chemo)    Comments: Hx hyperparathyroid s/p parathyroidectomy 2019   Musculoskeletal - negative ROS          Inpatient since 03/07/22 for AMS found to be septic, now w/ positive cultures from central port    TTE 03/07/22:    INDICATION  ----------------------------------------  Septic shock, bovine aortic valve, assess for vegetations    IMPRESSION  ----------------------------------------  1. Left ventricular cavity size is normal. There is mild concentric left ventricular hypertrophy. Left ventricular EF 40 - 45 %. Unable to assess diastolic function in the setting  of tachycardia.  2. Right ventricle is mildly dilated with decreased systolic function.  3. The sinuses of Valsalva appears dilated, measuring 3.4 cm. The ascending aorta appears dilated, measuring 3.8 cm.  4. 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 It is well seated and does not rock. Transvalvular gradients are within expected limits for the valve.  5. The mitral valve has mild regurgitation.  6. The tricuspid valve appears structurally normal. The tricuspid valve has trace/physiological regurgitation. The RSVP is estimated at 27-32 mmHg, which is normal.  7. Pulmonic valve appears structurally normal. The pulmonic valve has trace regurgitation.  8. No evidence of vegetation.  9. No prior study available for comparison.             Anesthesia Plan    ASA 3 - emergent     MAC     intravenous induction         Anesthetic plan, risks and benefits discussed with  patient.  Risks discussed included (but were not limited to)   General risks blindness, death, disability, drug reaction, perioprative CV events, ICU admission, infection, nausea, pain, respiratory events, sore throat, stroke, voice injury, backache, dental injury, muscle aches, post-op intubation, delirium and OSA RisksConsenting person understands and agrees to proceed. anesthesia consent form used.

## 2022-03-11 NOTE — Progress Notes (Signed)
Daily Progress Note   Name: Brandy [name]  DOB: Jan 25, 1956 66 y.o.  MRN: [id number]  CSN: [id number]  [id number] Mountz, DO    Subjective:     Brandy [name] is a 66 y.o. female patient with MSSA septicemia in setting of infected chemo port. Port being removed today. On treatment for metastatic breast Ca. Has bioprosthetic Aortic valve. Has no echo findings of valvular endocarditis. EF is moderately reduced. 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 Initiated on IV amio yesterday with good suppression.    All of ROS were reviewed and are normal except as stated.  Objective:     Vitals Current 24 Hour Min / Max      Temp    37.4 ?C (99.3 ?F)    Temp  Min: 0 ?C (32 ?F)  Max: 38.1 ?C (100.6 ?F)      BP     (!) 144/104     BP  Min: 106/82  Max: 152/85      HR    90    Pulse  Min: 83  Max: 117      RR    17    Resp  Min: 13  Max: 100      Sats      SpO2: 97 % on  L/min       SpO2  Min: 89 %  Max: 100 %      Weight    251 lb 8.7 oz (114.1 kg)  Body mass index is 44.56 kg/m?Marland Kitchen    Admit: 228 lb (103.4 kg)     Intake/Ouput:    Intake/Output Summary (Last 24 hours) at 03/11/2022 1552  Last data filed at 03/11/2022 1528  Gross per 24 hour   Intake 1100 ml   Output 3150 ml   Net -2050 ml        Exam:    General appearance: acyanotic, in no respiratory distress and chronically ill appearing.  ?  CV exam: normal rate, regular rhythm, normal S1, S2, no murmurs, rubs, clicks or gallops. JVP could not be evaluated due to neck size  ?  Chest: clear to auscultation, no wheezes, rales or rhonchi, symmetric air entry.   ?  Abdominal exam: soft, nontender, nondistended, no masses or organomegaly.  ?  Exam of extremities: peripheral pulses normal, no pedal edema, no clubbing or cyanosis  ?  Skin: Skin color, texture, turgor normal. No rashes or lesions.  No stigmata of endocarditis  ?  Neuro: alert, oriented x3, affect appropriate, no focal neurological deficits,  moves all extremities well and no involuntary movements    TELEMETRY: Rhythm: sinus at 90/min      Scheduled Medications:  . acetaminophen (TYLENOL) suppository  650 mg Rectal Q6H   . ampicillin-sulbactam (UNASYN) IV  3 g Intravenous Q12H   . doxycycline (VIBRAMYCIN) IVPB  100 mg Intravenous Q12H   . furosemide  40 mg Intravenous 2 times per day   . nystatin   Topical 2 times per day   . [START ON 03/12/2022] pantoprazole (PROTONIX) 40 mg in dextrose 5 % (D5W) 100 mL IVPB  40 mg Intravenous Daily   . polyvinyl alcohol-povidone(PF)  1 drop Both Eyes 4x Daily while awake       Infusions:   . amiodarone infusion 0.5 mg/min (03/11/22 1251)   . norepinephrine Stopped (03/08/22 0853)       PRN Medications: albuterol, calcium carbonate, HYDROmorphone, lidocaine,  lidocaine, LORazepam, magnesium hydroxide, naloxone (NARCAN) injection 0.04 mg, naloxone, norepinephrine, ondansetron **OR** ondansetron, polyethylene glycol, senna-docusate, sodium chloride 0.9 %, sodium chloride 0.9 %, sodium chloride 0.9 %, sulfur hexafluoride    Recent Labs:  Results for orders placed or performed during the hospital encounter of 03/07/22 (from the past 24 hour(s))   Blood Culture -x 2 sets Blood Blood    Collection Time: 03/10/22  4:44 PM    Specimen: Blood   Result Value Ref Range    Blood Culture Result (Crit)      Positive Blood Culture (see Gram stain result); additional workup pending.    Gram Stain Gram positive cocci in clusters (Crit)    Troponin T -STAT    Collection Time: 03/10/22  4:44 PM   Result Value Ref Range    Troponin T 0.37 (HCrit) 0.00 - <0.01 ng/mL   Basic Metabolic Panel -Next Routine    Collection Time: 03/10/22  4:44 PM   Result Value Ref Range    Sodium 146 (H) 135 - 145 mmol/L    Potassium 3.52 3.50 - 5.10 mmol/L    Chloride 111.8 (H) 101.0 - 111.0 mmol/L    CO2 - Carbon Dioxide 20 17 - 27 mmol/L    BUN 67 (H) 8 - 20 mg/dL    Creatinine 1.61 (H) 0.60 - 1.30 mg/dL    Glucose 096 (H) 74 - 106 mg/dL    Calcium 7.6 (L) 8.6  - 10.6 mg/dL    Anion Gap 04.5 9.0 - 18.0 mmol/L    Glomerular Filtration Rate Estimate (Female) 21 (L) >=60 mL/min/1.69m*2    GFR Additional Info      BUN / Creatinine Ratio 27.6 (H) 12.0 - 20.0    Osmolality Calculation 311.8 (H) 275.0 - 300.0 mOsm/kg   Hgb & Hct -Next Routine    Collection Time: 03/10/22  6:12 PM   Result Value Ref Range    Hemoglobin 8.5 (L) 11.7 - 16.5 g/dL    HCT 40.9 (L) 81.1 - 48.0 %   Comprehensive Metabolic Panel -Daily    Collection Time: 03/11/22  7:04 AM   Result Value Ref Range    Sodium 148 (H) 135 - 145 mmol/L    Potassium 3.29 (L) 3.50 - 5.10 mmol/L    Chloride 113.6 (H) 101.0 - 111.0 mmol/L    CO2 - Carbon Dioxide 21 17 - 27 mmol/L    BUN 63 (H) 8 - 20 mg/dL    Creatinine 9.14 (H) 0.60 - 1.30 mg/dL    Glucose 782 (H) 74 - 106 mg/dL    Calcium 7.8 (L) 8.6 - 10.6 mg/dL    AST - Aspartate Aminotransferase 91 (H) 5 - 32 IU/L    ALT - Alanine Aminotransferase 32 5 - 33 IU/L    Alkaline Phosphatase 67 35 - 105 IU/L    Protein Total 5.2 (L) 6.4 - 8.2 g/dL    Albumin 2.1 (L) 3.5 - 5.0 g/dL    Bilirubin Total 9.56 0.10 - 1.70 mg/dL    Anion Gap 21.3 9.0 - 18.0 mmol/L    Albumin/Globulin Ratio 0.7 (L) 1.2 - 2.2    Glomerular Filtration Rate Estimate (Female) 24 (L) >=60 mL/min/1.66m*2    GFR Additional Info      BUN / Creatinine Ratio 28.9 (H) 12.0 - 20.0    Osmolality Calculation 314.1 (H) 275.0 - 300.0 mOsm/kg   CBC with Auto Differential -Daily    Collection Time: 03/11/22  7:04 AM   Result Value Ref  Range    WBC 1.7 (LCrit) 4.5 - 11.0 10*3/?L    RBC 2.49 (L) 3.80 - 5.60 10*6/?L    Hemoglobin 7.6 (L) 11.7 - 16.5 g/dL    HCT 16.1 (L) 09.6 - 48.0 %    MCV 91.8 81.0 - 100.0 fL    MCH 30.4 28.3 - 33.3 pg    MCHC 33.1 32.5 - 36.0 g/dL    RDW 04.5 (H) 40.9 - 16.1 %    Platelet Count 33 (L) 150 - 405 10*3/?L    MPV 10.5 (H) 6.9 - 10.0 fL   Cellavision Differential -Next Routine    Collection Time: 03/11/22  7:04 AM   Result Value Ref Range    WBC 1.7 (LCrit) 4.5 - 11.0 10*3/?L    Neutrophils %  87 (H) 50 - 74 %    Lymphocytes % 12 (L) 20 - 40 %    Monocytes % 1 (L) 2 - 12 %    Eosinophils % 0 0 - 5 %    Basophils % 0 0 - 2 %    Neutrophils, Absolute 1.5 (L) 2.0 - 7.4 10*3/?L    Lymphocytes, Absolute 0.2 (L) 1.0 - 4.0 10*3/?L    Monocytes, Absolute 0.0 (L) 0.2 - 1.0 10*3/?L    Eosinophils, Absolute 0.0 0.0 - 0.4 10*3/?L    Basophils, Absolute 0.0 0.0 - 0.1 10*3/?L    Total Cells Counted 100     Platelet Estimate Decreased (A) Adequate    Dohle Bodies Present (A) (none)    Anisocytosis 1+ (A) (none)   Prepare RBC -: 1 units STAT    Collection Time: 03/11/22  7:40 AM   Result Value Ref Range    Blood Product Code [id number]     Blood Product Unit ID [id number]     Blood Product Unit ABO O     Blood Product Unit Rh NEG     Blood Product Unit Crossmatch Compatible     Blood Product Status Issued     Blood Product Unit Expiration [id number]     Blood Product Blood Type Barcode 9500     Blood Product Unit Volume 300 ml    Blood Product Code Description E0332 AS-1 RBC LR IRR    Hgb & Hct -STAT    Collection Time: 03/11/22  3:12 PM   Result Value Ref Range    Hemoglobin 8.9 (L) 11.7 - 16.5 g/dL    HCT 44.0 (L) 10.2 - 48.0 %     Pending Labs     Order Current Status    Blood Culture -x 2 sets Blood Blood In process    Blood Culture -x 2 sets Blood Blood In process    Blood Culture -x 2 sets Blood Blood In process    Catheter Tip Culture -STAT Catheter Tip (Specify Site) Other In process    Potassium -Timed In process    Blood Culture -x 2 sets Blood Blood Preliminary result    Blood Culture -x 2 sets Blood Blood Preliminary result    Blood Culture -x 2 sets Blood Blood Preliminary result          Recent Imaging:  No results found.        Assessment & Plan:     Active Hospital Problems    Diagnosis SNOMED CT(R) Date Noted   . Subarachnoid hemorrhage (HCC) HEMORRHAGE INTO SUBARACHNOID SPACE OF NEURAXIS 03/07/2022   . Acute encephalopathy DISORDER OF BRAIN 03/07/2022   . Septic shock (HCC) SEPTIC  SHOCK 03/07/2022    . Acute pyelonephritis ACUTE PYELONEPHRITIS 03/07/2022   . Acute renal failure, unspecified acute renal failure type (HCC) ACUTE KIDNEY INJURY 03/07/2022   . SAH (subarachnoid hemorrhage) (HCC) HEMORRHAGE INTO SUBARACHNOID SPACE OF NEURAXIS 03/07/2022   . Encephalopathy acute DISORDER OF BRAIN 03/07/2022   . 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 Liver lesion- small hypodense lesion around falciform lig LESION OF LIVER 01/21/2016           CARDIOLOGY ASSESSMENT:    1.  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  MSSA septicemia  3.  Septic shock  4.  Metastatic breast cancer  5.  Acute renal insufficiency  6.  Respiratory failure  7.  Subarachnoid bleed  8.  Nonischemic cardiomyopathy  9.  Pancytopenia          PLAN:    Patient's cardiovascular status appears stable at this time.  I would continue with IV amiodarone at the 0.5 mg/min dosing for the next 24 to 48 hours and then reevaluate.  Otherwise aggressive supportive care is to continue.          Electronically signed:Ailee Pates, MD9/30/20233:52 PM  This dictation was produced using voice recognition software. Despite concurrent proofreading, please note transcription errors may be present and that these errors may not truly reflect my intent.

## 2022-03-12 ENCOUNTER — Inpatient Hospital Stay: Admit: 2022-03-13

## 2022-03-12 LAB — CELLAVISION DIFFERENTIAL
Basophils %: 0 % (ref 0–2)
Basophils, Absolute: 0 10*3/ÂµL (ref 0.0–0.1)
Eosinophils %: 0 % (ref 0–5)
Eosinophils, Absolute: 0 10*3/ÂµL (ref 0.0–0.4)
Lymphocytes %: 17 % — ABNORMAL LOW (ref 20–40)
Lymphocytes, Absolute: 0.3 10*3/ÂµL — ABNORMAL LOW (ref 1.0–4.0)
Monocytes %: 7 % (ref 2–12)
Monocytes, Absolute: 0.1 10*3/ÂµL — ABNORMAL LOW (ref 0.2–1.0)
Neutrophils %: 76 % — ABNORMAL HIGH (ref 50–74)
Neutrophils, Absolute: 1.4 10*3/ÂµL — ABNORMAL LOW (ref 2.0–7.4)
Platelet Estimate: DECREASED — AB
Total Cells Counted: 100
WBC: 1.8 10*3/ÂµL — ABNORMAL LOW (ref 4.5–11.0)

## 2022-03-12 LAB — CBC WITH AUTO DIFFERENTIAL
HCT: 27.4 % — ABNORMAL LOW (ref 35.0–48.0)
Hemoglobin: 9 g/dL — ABNORMAL LOW (ref 11.7–16.5)
MCH: 30.5 pg (ref 28.3–33.3)
MCHC: 32.9 g/dL (ref 32.5–36.0)
MCV: 92.6 fL (ref 81.0–100.0)
MPV: 11.1 fL — ABNORMAL HIGH (ref 6.9–10.0)
Platelet Count: 57 10*3/ÂµL — ABNORMAL LOW (ref 150–405)
RBC: 2.95 10*6/ÂµL — ABNORMAL LOW (ref 3.80–5.60)
RDW: 20.2 % — ABNORMAL HIGH (ref 11.7–16.1)
WBC: 1.8 10*3/ÂµL — ABNORMAL LOW (ref 4.5–11.0)

## 2022-03-12 LAB — BLOOD CULTURE
Blood Culture Result: NO GROWTH
Blood Culture Result: NO GROWTH

## 2022-03-12 LAB — COMPREHENSIVE METABOLIC PANEL
ALT - Alanine Aminotransferase: 27 IU/L (ref 5–33)
AST - Aspartate Aminotransferase: 77 IU/L — ABNORMAL HIGH (ref 5–32)
Albumin/Globulin Ratio: 0.6 — ABNORMAL LOW (ref 1.2–2.2)
Albumin: 2.1 g/dL — ABNORMAL LOW (ref 3.5–5.0)
Alkaline Phosphatase: 72 IU/L (ref 35–105)
Anion Gap: 12.9 mmol/L (ref 9.0–18.0)
BUN / Creatinine Ratio: 30.8 — ABNORMAL HIGH (ref 12.0–20.0)
BUN: 57 mg/dL — ABNORMAL HIGH (ref 8–20)
Bilirubin Total: 0.6 mg/dL (ref 0.10–1.70)
CO2 - Carbon Dioxide: 21 mmol/L (ref 17–27)
Calcium: 8.2 mg/dL — ABNORMAL LOW (ref 8.6–10.6)
Chloride: 114.1 mmol/L — ABNORMAL HIGH (ref 101.0–111.0)
Creatinine: 1.85 mg/dL — ABNORMAL HIGH (ref 0.60–1.30)
Glomerular Filtration Rate Estimate (Female): 30 mL/min/{1.73_m2} — ABNORMAL LOW (ref >=60)
Glucose: 107 mg/dL — ABNORMAL HIGH (ref 74–106)
Osmolality Calculation: 310.6 mOsm/kg — ABNORMAL HIGH (ref 275.0–300.0)
Potassium: 3.59 mmol/L (ref 3.50–5.10)
Protein Total: 5.6 g/dL — ABNORMAL LOW (ref 6.4–8.2)
Sodium: 148 mmol/L — ABNORMAL HIGH (ref 135–145)

## 2022-03-12 LAB — POTASSIUM: Potassium: 3.51 mmol/L (ref 3.50–5.10)

## 2022-03-12 MED ORDER — potassium chloride IVPB 20 mEq/100 mL
20 | Freq: Once | INTRAVENOUS | Status: AC
Start: 2022-03-12 — End: 2022-03-12
  Administered 2022-03-12: 22:00:00 20 meq via INTRAVENOUS
  Administered 2022-03-12: 23:00:00 20100 meq via INTRAVENOUS

## 2022-03-12 MED ORDER — amiodarone in iso-osm dextrose (NEXTERONE) 360 mg/200 mL (1.8 mg/mL) infusion premix
360 | INTRAVENOUS | Status: AC
Start: 2022-03-12 — End: 2022-03-13
  Administered 2022-03-12 – 2022-03-13 (×4): 360 mg/min via INTRAVENOUS
  Administered 2022-03-13 (×2): 3602001.8 mg/min via INTRAVENOUS
  Administered 2022-03-13: 09:00:00 360 mg/min via INTRAVENOUS
  Administered 2022-03-14 (×2): 3602001.8 mg/min via INTRAVENOUS

## 2022-03-12 MED ORDER — potassium chloride IVPB 20 mEq/100 mL
20 | Freq: Once | INTRAVENOUS | Status: AC
Start: 2022-03-12 — End: 2022-03-12
  Administered 2022-03-12: 16:00:00 20 meq via INTRAVENOUS
  Administered 2022-03-12: 17:00:00 20100 meq via INTRAVENOUS

## 2022-03-12 MED ORDER — potassium chloride IVPB 20 mEq/100 mL
20 | Freq: Once | INTRAVENOUS | Status: AC
Start: 2022-03-12 — End: 2022-03-12
  Administered 2022-03-12: 19:00:00 20100 meq via INTRAVENOUS
  Administered 2022-03-12: 18:00:00 20 meq via INTRAVENOUS

## 2022-03-12 MED ORDER — potassium chloride IVPB 20 mEq/100 mL
20 | Freq: Once | INTRAVENOUS | Status: AC
Start: 2022-03-12 — End: 2022-03-12
  Administered 2022-03-12: 23:00:00 20 meq via INTRAVENOUS
  Administered 2022-03-12: 20100 meq via INTRAVENOUS

## 2022-03-12 NOTE — Other (Signed)
SLP evaluation order received on the weekend. Spoke with RN, who reported she has been suctioning phlegm/secretions out of pt's throat today.  Pt has NG tube.    Evaluation not indicated today because: Patient has enteral/parenteral nutrition (feeding tube or TPN)  and Patient does not meet criteria for weekend evaluation    Plan: SLP team to check in with pt/RN next business day and conduct evaluation if/when indicated

## 2022-03-12 NOTE — Plan of Care (Signed)
Pt doing well this shift. Oral care performed intermittently throughout shift. Small sips of water given per MD at bedside. Appears to tolerate well but knowledge deficit noted in regarded to aspiration risk. Pt exhibiting weak, non-productive cough on demand, however is able to expresses suctioning needs prn. Sputum thick, bloody.     Currently on 5- 7L oxymask today. Febrile, 100.1. BPs stable. ST in low 100s. Amio infusing per mar at recommendations of cardiology. Pain relieved with meds per mar. Tube feeds initiated per order. Tolerating well at time of this note.     Pt had BMs x2 this shift, dark mushy. Q2 turns and pressure relieved prn. Wound in gluteal cleft cleansed and barrier ointment applied. Pt able to use call light and express needs independently. Pt educated on exercises while bed bound to help increase strength and mobility until PT/OT assist. HOB 30 degrees. Educated on continued NPO status and care plan.     Problem: [facility] Braden Scale Score 18 & Lower  Goal: For scores 18 & lower:  Description: For scores 18 & lower:  Outcome: Progressing     Problem: Insufficient Fluid Volume  Goal: Fluid and electrolyte balance are achieved/maintained  Description: Assess and monitor vital signs (orthostatic vitals if applicable), fluid intake and output, urine color, labs, skin turgor, mucous membranes, mental status, and gastrointestinal system for nausea, vomiting and diarrhea.  Monitor for signs and symptoms of hypovolemia (tachycardia, rapid breathing, decreased urine output, postural hypotension, confusion, syncope).  Collaborate with interdisciplinary team and initiate plan and interventions as ordered.  Outcome: Progressing     Problem: Hemodynamic Status  Goal: Patient's vitals signs are stable  Description: Assess and monitor patient's heart rate, rhythm, respiratory rate, peripheral pulses, capillary refill, color, body temperature, intake and output, labs and physical activity tolerance.    Observe for signs of chest pain (note location, duration, severity, radiation and associated symptoms such as diaphoresis, nausea, indigestion).  Monitor for signs and symptoms of heart failure (eg. shortness of breath, edema of feet/ankles/legs, rapid irregular heart rate, coughing, wheezing, white/pink blood tinged sputum, sudden weight gain, chest pain). Collaborate with interdisciplinary team and initiate plan and interventions as ordered.  Outcome: Progressing     Problem: Insufficient Nutritional Intake  Goal: Patient's nutritional intake is adequate  Description: Assess and monitor food intake and supplements, patient food preferences, nausea, vomiting, labs, oral cavity (gums, teeth, tongue, mucosa), proper denture fit, and cultural beliefs.  Monitor for signs of hypoglycemia and hyperglycemia.  Collaborate with interdisciplinary team and initiate plan and interventions as ordered.  Outcome: Progressing     Problem: Inadequate Mobility/Activity Status  Goal: Mobility/activity is maintained at optimum level for patient  Description: Assess and monitor patient  barriers to mobility and need for assistive/adaptive devices. Assess patient's emotional response to limitations. Collaborate with interdisciplinary team and initiate plans and interventions as ordered.  Outcome: Progressing     Problem: Infection  Goal: Signs and symptoms of infections are decreased or avoided  Description: Assess and monitor patient for signs and symptoms of infection such as redness, warmth, discharge, and increased body temperature. Monitor and report abnormal lab values (ex-CBC and diff, serum protein, serum albumin, and cultures).  Wash hands properly before and after each patient care activity. Utilize standard precautions and use personal protective equipment (PPE) as indicated. Ensure aseptic care of all intravenous lines and invasive tubes/drains. Obtain immunization and exposure to communicable diseases history. Collaborate  with interdisciplinary team and initiate plan and interventions as ordered.  Outcome: Progressing

## 2022-03-12 NOTE — Progress Notes (Signed)
Daily Progress Note   Name: Brandy [name]  DOB: 07-06-1955 66 y.o.  MRN: [id number]  CSN: [id number]  [id number] Mountz, DO    Subjective:     Brandy [name] is a 66 y.o. female patient with MSSA septicemia in setting of infected chemo port. Port removed yesterday. On treatment for metastatic breast Ca. Has bioprosthetic Aortic valve with no echo findings of valvular endocarditis. EF is moderately reduced. 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 Initiated on IV amio 9/29 with good suppression. She appears to be slowly progressing.    All of ROS were reviewed and are normal except as stated.  Objective:     Vitals Current 24 Hour Min / Max      Temp    37.7 ?C (99.9 ?F)    Temp  Min: 36.6 ?C (97.8 ?F)  Max: 37.7 ?C (99.9 ?F)      BP     121/76     BP  Min: 94/52  Max: 150/100      HR    101    Pulse  Min: 86  Max: 109      RR    15    Resp  Min: 0  Max: 100      Sats      SpO2: 90 % on  L/min       SpO2  Min: 86 %  Max: 100 %      Weight    251 lb 8.7 oz (114.1 kg)  Body mass index is 44.56 kg/m?Marland Kitchen    Admit: 228 lb (103.4 kg)     Intake/Ouput:    Intake/Output Summary (Last 24 hours) at 03/12/2022 1140  Last data filed at 03/12/2022 1124  Gross per 24 hour   Intake 747 ml   Output 5300 ml   Net -4553 ml        Exam:    General appearance:?acyanotic, in no respiratory distress and chronically ill appearing.  ?  CV exam:?normal rate, regular rhythm, normal S1, S2, no murmurs, rubs, clicks or gallops.?JVP could not be evaluated due to neck size  ?  Chest:?clear to auscultation, no wheezes, rales or rhonchi, symmetric air entry.   ?  Abdominal exam:?soft, nontender, nondistended, no masses or organomegaly.  ?  Exam of extremities:?peripheral pulses normal, no pedal edema, no clubbing or cyanosis  ?  Skin:?Skin color, texture, turgor normal. No rashes or lesions.??No stigmata of endocarditis  ?  Neuro:?alert, oriented x3, affect appropriate, no  focal neurological deficits, moves all extremities well and no involuntary movements  ?  TELEMETRY: Rhythm: sinus at 90/min  ?      Scheduled Medications:  . acetaminophen (TYLENOL) suppository  650 mg Rectal Q6H   . ampicillin-sulbactam (UNASYN) IV  3 g Intravenous Q12H   . doxycycline (VIBRAMYCIN) IVPB  100 mg Intravenous Q12H   . furosemide  40 mg Intravenous 2 times per day   . nystatin   Topical 2 times per day   . pantoprazole (PROTONIX) 40 mg in dextrose 5 % (D5W) 100 mL IVPB  40 mg Intravenous Daily   . polyvinyl alcohol-povidone(PF)  1 drop Both Eyes 4x Daily while awake   . potassium chloride in water  20 mEq Intravenous Once       Infusions:   . amiodarone infusion 0.5 mg/min (03/11/22 2342)       PRN Medications: albuterol, calcium carbonate, HYDROmorphone, lidocaine, lidocaine, LORazepam,  magnesium hydroxide, naloxone (NARCAN) injection 0.04 mg, naloxone, ondansetron **OR** ondansetron, polyethylene glycol, senna-docusate, sodium chloride 0.9 %, sodium chloride 0.9 %, sodium chloride 0.9 %, sulfur hexafluoride    Recent Labs:  Results for orders placed or performed during the hospital encounter of 03/07/22 (from the past 24 hour(s))   Catheter Tip Culture -STAT Catheter Tip (Specify Site) Other    Collection Time: 03/11/22  1:50 PM    Specimen: Catheter Tip (Specify Site); Other   Result Value Ref Range    Culture Result Heavy Growth - >15 cfu Staphylococcus aureus (A)    Potassium -Timed    Collection Time: 03/11/22  3:12 PM   Result Value Ref Range    Potassium 3.64 3.50 - 5.10 mmol/L   Hgb & Hct -STAT    Collection Time: 03/11/22  3:12 PM   Result Value Ref Range    Hemoglobin 8.9 (L) 11.7 - 16.5 g/dL    HCT 54.0 (L) 98.1 - 48.0 %   CBC with Auto Differential -Daily    Collection Time: 03/12/22  7:15 AM   Result Value Ref Range    WBC 1.8 (LCrit) 4.5 - 11.0 10*3/?L    RBC 2.95 (L) 3.80 - 5.60 10*6/?L    Hemoglobin 9.0 (L) 11.7 - 16.5 g/dL    HCT 19.1 (L) 47.8 - 48.0 %    MCV 92.6 81.0 - 100.0 fL     MCH 30.5 28.3 - 33.3 pg    MCHC 32.9 32.5 - 36.0 g/dL    RDW 29.5 (H) 62.1 - 16.1 %    Platelet Count 57 (L) 150 - 405 10*3/?L    MPV 11.1 (H) 6.9 - 10.0 fL   Cellavision Differential -Next Routine    Collection Time: 03/12/22  7:15 AM   Result Value Ref Range    WBC 1.8 (LCrit) 4.5 - 11.0 10*3/?L    Neutrophils % 76 (H) 50 - 74 %    Lymphocytes % 17 (L) 20 - 40 %    Monocytes % 7 2 - 12 %    Eosinophils % 0 0 - 5 %    Basophils % 0 0 - 2 %    Neutrophils, Absolute 1.4 (L) 2.0 - 7.4 10*3/?L    Lymphocytes, Absolute 0.3 (L) 1.0 - 4.0 10*3/?L    Monocytes, Absolute 0.1 (L) 0.2 - 1.0 10*3/?L    Eosinophils, Absolute 0.0 0.0 - 0.4 10*3/?L    Basophils, Absolute 0.0 0.0 - 0.1 10*3/?L    Total Cells Counted 100     Platelet Estimate Decreased (A) Adequate    Atypical Lymphocytes Present (A) (none)    Dohle Bodies Present (A) (none)    Poikilocytosis 1+ (A) (none)   Comprehensive Metabolic Panel -Daily    Collection Time: 03/12/22  7:16 AM   Result Value Ref Range    Sodium 148 (H) 135 - 145 mmol/L    Potassium 3.59 3.50 - 5.10 mmol/L    Chloride 114.1 (H) 101.0 - 111.0 mmol/L    CO2 - Carbon Dioxide 21 17 - 27 mmol/L    BUN 57 (H) 8 - 20 mg/dL    Creatinine 3.08 (H) 0.60 - 1.30 mg/dL    Glucose 657 (H) 74 - 106 mg/dL    Calcium 8.2 (L) 8.6 - 10.6 mg/dL    AST - Aspartate Aminotransferase 77 (H) 5 - 32 IU/L    ALT - Alanine Aminotransferase 27 5 - 33 IU/L    Alkaline Phosphatase 72 35 -  105 IU/L    Protein Total 5.6 (L) 6.4 - 8.2 g/dL    Albumin 2.1 (L) 3.5 - 5.0 g/dL    Bilirubin Total 1.61 0.10 - 1.70 mg/dL    Anion Gap 09.6 9.0 - 18.0 mmol/L    Albumin/Globulin Ratio 0.6 (L) 1.2 - 2.2    Glomerular Filtration Rate Estimate (Female) 30 (L) >=60 mL/min/1.22m*2    GFR Additional Info      BUN / Creatinine Ratio 30.8 (H) 12.0 - 20.0    Osmolality Calculation 310.6 (H) 275.0 - 300.0 mOsm/kg     Pending Labs     Order Current Status    Blood Culture -x 2 sets Blood Blood In process    Blood Culture -x 2 sets Blood Blood In  process    Blood Culture -x 2 sets Blood Blood Preliminary result    Blood Culture -x 2 sets Blood Blood Preliminary result    Blood Culture -x 2 sets Blood Blood Preliminary result    Blood Culture -x 2 sets Blood Blood Preliminary result    Blood Culture -x 2 sets Blood Blood Preliminary result    Catheter Tip Culture -STAT Catheter Tip (Specify Site) Other Preliminary result          Recent Imaging:  X-ray abdomen AP    Result Date: 03/11/2022  EXAMINATION: XR ABDOMEN AP HISTORY: Assess proper placement of NGT COMPARISON: None. FINDINGS: BOWEL GAS PATTERN: Non-obstructive. SUBPHRENIC GAS: None identified. RENAL CALCULI: None. SURGICAL CHANGES: Feeding tube tip overlies the distal antrum of the stomach. OTHER: None.     IMPRESSION: 1. Feeding tube tip overlies the distal antrum of the stomach. NOTE: This dictation was produced using voice recognition software. Although effort has been made to minimize transcription errors, homonyms and other transcription errors may be present and may not truly reflect my intent. Please excuse any unintentional verbiage, or grammatical errors. Dictated by: Junious Dresser Electronically Signed by: Junious Dresser on 03/11/2022 4:40 PM Report ID#: [id number]           Assessment & Plan:     Active Hospital Problems    Diagnosis SNOMED CT(R) Date Noted   . Subarachnoid hemorrhage (HCC) HEMORRHAGE INTO SUBARACHNOID SPACE OF NEURAXIS 03/07/2022   . Acute encephalopathy DISORDER OF BRAIN 03/07/2022   . Septic shock (HCC) SEPTIC SHOCK 03/07/2022   . Acute pyelonephritis ACUTE PYELONEPHRITIS 03/07/2022   . Acute renal failure, unspecified acute renal failure type (HCC) ACUTE KIDNEY INJURY 03/07/2022   . SAH (subarachnoid hemorrhage) (HCC) HEMORRHAGE INTO SUBARACHNOID SPACE OF NEURAXIS 03/07/2022   . Encephalopathy acute DISORDER OF BRAIN 03/07/2022   . 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 Liver lesion- small hypodense lesion around falciform lig  LESION OF LIVER 01/21/2016           CARDIOLOGY ASSESSMENT:    1.  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  MSSA septicemia  3.  Septic shock  4.  Metastatic breast cancer  5.  Acute renal insufficiency  6.  Respiratory failure  7.  Subarachnoid bleed  8.  Nonischemic cardiomyopathy  9.  Pancytopenia          PLAN:    Continue IV amio infusion for next 24 hrs and then stop. If she has recurrence of arrhythmia she will require long acting antiarrhythmic therapy.          Electronically signed:Aino Heckert, MD10/1/202311:40 AM  This dictation was produced using voice recognition software. Despite  concurrent proofreading, please note transcription errors may be present and that these errors may not truly reflect my intent.

## 2022-03-12 NOTE — Progress Notes (Signed)
ICU Progress Note  [facility]   Name: Brandy [name]  DOB: 1955/08/07 66 y.o.  MRN: [id number]  CSN: [id number]  TDD:UKGUR Mountz, DO    Subjective:     Brandy [name] is a 65 y.o. 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    Interim events: Dr. Broadus Brandy [name] graciously removed the patient's right sided subclavian port. Continuing amiodarone gtt. Desaturated to 40s and coughing a lot with 10L oxymask. Dark brown hard chunk which could be blood clot or mucus plug removed with suction. Requiring periodic oral suction. Mental status has improved considerably.    REVIEW OF SYSTEMS:  Endorses pain everywhere Denies nausea, dyspnea, anxiety, and palpitations.     Objective:     Vitals Current 24 Hour Min / Max      Temp    (!) 38.3 ?C (100.9 ?F)    Temp  Min: 36.6 ?C (97.8 ?F)  Max: 38.4 ?C (101.1 ?F)      BP     126/84     BP  Min: 94/52  Max: 144/104      HR    110    Pulse  Min: 87  Max: 112      RR    14 (not 4)    Resp  Min: 0  Max: 38      Sats      SpO2: 91 % on  L/min       SpO2  Min: 82 %  Max: 100 %      Weight    114.1 kg (251 lb 8.7 oz)  Body mass index is 44.56 kg/m?Marland Kitchen    Admit: 103.4 kg (228 lb)   Intake/Ouput:    Intake/Output Summary (Last 24 hours) at 03/12/2022 1507  Last data filed at 03/12/2022 1431  Gross per 24 hour   Intake 677 ml   Output 6050 ml   Net -5373 ml      LDAs:   Triple lumen right IJ CVC  Foley  pIV x2    Scheduled Medications:  . acetaminophen (TYLENOL) suppository  650 mg Rectal Q6H   . ampicillin-sulbactam (UNASYN) IV  3 g Intravenous Q12H   . doxycycline (VIBRAMYCIN) IVPB  100 mg Intravenous Q12H   . furosemide  40 mg Intravenous  2 times per day   . nystatin   Topical 2 times per day   . pantoprazole (PROTONIX) 40 mg in dextrose 5 % (D5W) 100 mL IVPB  40 mg Intravenous Daily   . polyvinyl alcohol-povidone(PF)  1 drop Both Eyes 4x Daily while awake   . potassium chloride in water  20 mEq Intravenous Once    Followed by   . potassium chloride in water  20 mEq Intravenous Once     Infusions:   . amiodarone infusion 0.5 mg/min (03/12/22 1222)     PRN Medications: albuterol, calcium carbonate, HYDROmorphone, lidocaine, lidocaine, LORazepam, magnesium hydroxide, naloxone (NARCAN) injection 0.04 mg, naloxone, ondansetron **OR** ondansetron, polyethylene glycol, senna-docusate, sodium chloride 0.9 %, sodium chloride 0.9 %, sodium chloride 0.9 %, sulfur hexafluoride    Code Status: DNR    PHYSICAL EXAMINATION:   Gen: NAD, pleasant, speaking in full sentences and asking questions, though has poor articulation  HENT:  EOM intact, Conjunctiva normal. oxymask on.  CV: Tachycardic, regular rhythm, -m/r/g  Pulm: On BIPAP, CTAB  Abd: NT/ND, normal BS  Ext: pale, cap refill <2 sec. trace edema in hands. Trace edema in lower limbs.    Recent Labs:   Results from last 7 days   Lab Units 03/12/22  0715 03/11/22  1512 03/11/22  0704 03/10/22  1812 03/10/22  0532 03/09/22  9562 03/08/22  1512 03/08/22  0641 03/08/22  0000 03/07/22  1707 03/07/22  1103   WBC 10*3/?L 1.8*  1.8*  --  1.7*  1.7*  --  2.9*  2.9* 4.4*  4.4* 6.3  6.3 8.4  8.4 10.5  10.5  --  4.9  4.9   HGB g/dL 9.0* 8.9* 7.6* 8.5* 7.3* 7.2* 6.9* 7.2* 7.3* 7.5* 9.1*   MCV fL 92.6  --  91.8  --  92.3 91.2 92.0 93.1 92.8  --  92.2   NEUTROS PCT % 76*  --  87*  --  95* 96* 95* 93* 91*  --  91*   PLATELETS 10*3/?L 57*  --  33*  --  31* 35* 42* 40* 47*  --  68*     Results from last 7 days   Lab Units 03/12/22  1300 03/12/22  0716 03/11/22  1512 03/11/22  0704 03/10/22  1644 03/10/22  1118 03/10/22  0532 03/09/22  0614 03/08/22  1512 03/08/22  0641 03/08/22  0000 03/07/22  1707 03/07/22  1103   SODIUM  mmol/L  --  148*  --  148* 146* 146* 144 141 138 139 133* 138 137   POTASSIUM mmol/L 3.51 3.59 3.64 3.29* 3.52 3.62 3.60 4.04 4.01 4.11 3.95 3.56 3.74   CHLORIDE mmol/L  --  114.1*  --  113.6* 111.8* 112.7* 111.3* 109.1 105.5 106.2 100.9* 103.3 100.8*   CO2 mmol/L  --  21  --  21 20 20 20  16* 15* 15* 16* 13* 17   GLUCOSE mg/dL  --  130*  --  865* 784* 108* 116* 116* 121* 163* 192* 150* 142*   BUN mg/dL  --  57*  --  63* 67* 67* 69* 78* 78* 71* 70* 63* 65*   CREATININESERUM mg/dL  --  6.96*  --  2.95* 2.84* 2.49* 2.54* 3.50* 3.61* 3.96* 3.85* 4.09* 4.21*   CALCIUM mg/dL  --  8.2*  --  7.8* 7.6* 7.6* 7.2* 6.9* 6.9* 7.1* 6.9* 7.0* 8.1*   MAGNESIUM mg/dL  --   --   --   --   --  1.7  --   --   --   --   --   --  1.6   PHOSPHORUS mg/dL  --   --   --   --   --   --  3.6  --   --   --   --   --  4.1     Results from last 7 days   Lab Units 03/12/22  0716 03/11/22  0704 03/10/22  0532 03/09/22  0614 03/08/22  0641 03/07/22  1103   AST IU/L 77* 91* 123* 180* 238* 237*   ALT IU/L 27 32 44* 55* 64* 74*   ALK PHOS IU/L 72 67 69 73 82 61   PROTEIN TOTAL g/dL 5.6* 5.2* 5.1* 5.3* 5.5* 7.0   ALBUMIN g/dL 2.1* 2.1* 2.3* 2.3* 2.6* 3.4*   A/G RATIO  0.6* 0.7* 0.8* 0.8* 0.9* 0.9*  Results from last 7 days   Lab Units 03/10/22  1644 03/10/22  1118   TROPONIN T ng/mL 0.37* 0.37*     Results from last 7 days   Lab Units 03/07/22  1103   INR  1.3*   PROTIME Seconds 14.4*         Recent Imaging:  X-ray Chest 1 View    Result Date: 03/10/2022  EXAMINATION: XR CHEST 1 VIEW HISTORY: svt. COMPARISON: 03/07/2022 FINDINGS: LINES/TUBES:  The following lines are seen - Central line seen placed through a right jugular approach with the distal tip at the cavoatrial junction. Central line and associated port placed through a right subclavian approach with the distal tip at the right atrium. LUNGS: 1. Patchy mixed interstitial and airspace opacities bilaterally.  These appears slightly worse than 03/07/2022 differential diagnosis includes, but is  not limited to, pulmonary edema, pneumonia, and ARDS. CARDIAC: Normal size cardiac silhouette. Sternal wires and evidence of prior mediastinal surgery. Aortic valve prosthesis is seen in place. MEDIASTINUM: No superior mediastinal widening. PLEURA: Mild blunting of the right costophrenic angle. OTHER: Negative.     IMPRESSION: 1. See Above. NOTE: This dictation was produced using voice recognition software. Although effort has been made to minimize transcription errors, homonyms and other transcription errors may be present and may not truly reflect my intent. Please excuse any unintentional verbiage, or grammatical errors. Dictated by: Junious Dresser Electronically Signed by: Junious Dresser on 03/10/2022 11:36 AM Report ID#: [id number]     Assessment & Plan:   ASSESSMENT:  Patient Active Problem List    Diagnosis   . Subarachnoid hemorrhage (HCC)   . Acute encephalopathy   . Septic shock (HCC)   . Acute pyelonephritis   . Acute renal failure, unspecified acute renal failure type (HCC)   . SAH (subarachnoid hemorrhage) (HCC)   . Encephalopathy acute   . History of amputation of right great toe (HCC)   . Menopausal syndrome on hormone replacement therapy   . Long term (current) use of aromatase inhibitors   . HER2-positive carcinoma of breast (HCC)   . Thickened endometrium   . Malignant neoplasm of hepatic flexure (HCC)   . Axillary adenopathy   . 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 Anaphylaxis   . Back pain, thoracic   . Hyperparathyroidism (HCC)   . Colon cancer (HCC)   . Aortic valve disorder   . Heparin induced thrombocytopenia (HCC)   . Non-ischemic cardiomyopathy (HCC)   . Demand ischemia (HCC)   . Obesity   . Iron deficiency anemia   . Hilar lymphadenopathy- bilataeral    . Liver lesion- small hypodense lesion around falciform lig     Brandy [name] is a 66 y.o. 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     CARDIOVASCULAR:   #Severe Aortic Stenosis s/p Bioprosthetic Aortic Valve - 2017  POA. Complicates care if bacteremic. STAT ECHO shows normally functioning prosthetic valve with no signs of vegetation.  - Repeat ECHO, preferably TEE 10/02     #Supraventricular Tacycardia  Developed on 9/29, lasted a few minutes and terminated without intervention. Ddx includes MI, PE, a fib, other arrhythmia, sepsis,  anemia, electrolyte abnormality. History of afib. New ST depressions in V3 and V6 on EKG. Troponin of 0.37 > 0.37. Dr. Marcene Brawn consulted, greatly appreciate recommendations.   - Amiodarone bolus and gtt. Per cardiology, continue infusion for 24 hrs then stop (10/2). If arrhythmia occurs, will require long acting antiarrhythmic.?  #HFrEF   POA. Does not appear in exacerbation, appears to have improved following valve replacement in 2017. STAT ECHO 9/26 shows EF 40-45%.  - Holding carvedilol, atorvastatin, ASA while acutely ill. Can resume when able to swallow.     PULM:   #Acute Hypoxic Respiratory Failure   POA. No baseline O2 use. No known lung disease. S/p NG to decompress stomach after bagging by EMS.  - RTDP  - O2 to keep sats >90%    ID:   #MSSA bacteremia of unclear source  POA. Ongoing fevers as of 10/01. Blood cultures again positive 9/28. ID consult 9/29: Discussed the case with Dr. Bertell Maria at [facility] in [place] who recommended continuation of antibiotics to cover MSSA, Klebsiella, and atypicals, with removal of port and further imaging only if blood cultures are positive again, with the exception of TEE, which he strongly recommends given bovine aortic valve. Blood cultures turned  positive and Dr. Broadus Brandy [name] graciously removed the port on 9/30 for source control. Antibiotic course:   - Ceftriaxone, 9/26, 9/28-29  - Cefepime 9/26-9/28  - Vancomycin 9/26-29  - Cefazolin 9/29  - Unasyn 9/29 -   - Doxycycline 9/29 -   Daily blood cultures  Consider removal of CVC 10/2  TEE 10/2  Continue scheduled tylenol    #UTI  POA. Asymptomatic. Culture grew Klebsiella pan-sensitive. For source control, replaced foley 9/28.    #Community acquired pneumonia   POA. R lower lobe. Repeat CXR 9/29 appears worse, most consistent with pulmonary edema given clinical presentation of hypervolemia.  - ID consult: appreciate recommendations to cover atypicals in case of pneumonia.    #Intrigenous candidal infection  - nystatin powder and Skinfold dry sheets    ENDO:   #Hyperparathyroidism s/p parathyroidectomy 2019  Complicates care. Monitor electrolytes.     RENAL:   #Acute Renal Failure   POA. Cr normal at baseline, 4.21 on admission. Renal US showed no solid mass or hydronephrosis, though CT showed calculus in bladder that may have recently passed. Creatinine improving s/p fluid resuscitation.   - Foley, strict I/O   - Pharmacy consulted to review medications     GI/FEN:   #Acute Hepatitis   POA. Likely shock liver. RUQ US shows diffuse increased echogenicity of the liver which could indicate fatty infiltration, hepatitis, or cirrhosis. LFTs are trending downward.    #Hypervolemia  #Free Water Deficit  Fluid overloaded with pulmonary edema on CXR 9/29. Pitting edema present on upper extremities. Net fluid status is +10L since admission. Na trending up. Maintenance fluids D5W 9/29. NG tube placed 9/30 and given free water enterally  - 200 mL free water q4hrs  - Continue to trend Na and titrate free water accordingly   - Titrating furosemide  - Referral placed for SLP and will evaluate when indicated    NEURO:   #Acute Metabolic Encephalopathy   POA. Likely due to severe sepsis, GCS initially 3. Treating the underlying  causes. Intubation discussed at length with the husband, decided DNR/DNI. By 9/27 was moving spontaneously, whispering single words, and able to answer questions. Improved 10/1 to speaking in full sentences and initiating conversation.  - Dilaudid PRN for pain   ?  #  Trace Non-Aneurysmal Subarachnoid Hemorrhage  POA. Seen on CT head on admission. Discussed with Neurosurgery by ED physician, gave no recommendations for management.   - Seizure precautions   - Avoid anticoagulation for 4 weeks   - Is likely stable enough to undergo brain MRI. Consider when kidney function improved so contrast can be used, to evaluate for metastases/other causes of encephalopathy or SAH    SKIN/MSK: ?  #Pressure Injuries, Ecchymosis  POA. Multiple and scattered across back.   - Wound care consultation  - Frequent position changes     HEME/ONC:   #Pancytopenia   #Hx HIT  #Coagulopathy of Sepsis  Developed over hospital course. At baseline, has iron deficiency anemia and chemotherapy-induced anemia. Hgb intermittently dropped, requiring blood transfusions, now s/p 3upRBCs. On 9/30, ANC 1500.   - Neutropenic precautions  - Continue to trend H&H  - TT Hgb <8  - Avoid heparin   - Monitor for bleeding   ?  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 Following with Dr. Nadeen [facility] at [facility], currently on Docetaxel/Trastuzumab. Metastatic to lymph node. Last treatment 02/09/2022. Required blood transfusions due to chemotherapy.   ?  #Colon cancer s/p colectomy 2018  Complicates care, bowel regimen.     Resolved Hospital Problems  #Septic Shock: Severe and refractory. Appropriately fluid resuscitated. S/p stress dose steroids. Levophed 9/26-9/27.  #Respiratory Alkalosis: Tachypneic due to sepsis/hyperthermia.  #Rhabdomyolysis: Mild. McMahon Score: high risk. Fluid resuscitated. CK peaked 9/27 at 5,722 and then decreased.  #Anion Gap Metabolic Acidosis: POA. Likely from shock and renal failure. Lactate normal.   #Hyperthermia: Temperature  initially nearly 105F, likely contributed to rhabdomyolysis. She was cooled using fans, IVF, APAP.      Feeding: NG feeding tube with vital HP starting at 18ml/hr and icreasing by 10ml q1hr for goal of 70 ml/hr. Tolerating sips of water. Nutrition consultation   Analgesia: Dilaudid PRN  Sedation: None  Thromboprophylaxis: None - subarachnoid hemorrhage   Head of bed: 30 degrees  Ulcer prophylaxis: PPI  Glucose: 5.5% (07/27/2020); 6.0% (01/26/2016); 5.2% (01/21/2016)   Bowel regimen: PRN  I - Lines/Drains/Airway: CVC R IJ 9/26, R radial arterial line 9/26-9/28, NG tube 9/26-9/27, Foley 9/26  Deescalate antibiotics - ongoing and complex course, see above    CODE: DNR/DNI per multiple conversations with patient's husband    Verbal MDPOA: Husband, Brandy [name]  DISPO: PCU, guarded    Social: Lives with husband, Brandy [name], who is MDPOA      Brandy [name]   03/12/2022  3:07 PM

## 2022-03-12 NOTE — Plan of Care (Signed)
Pt following commands/nodding appropriately. Still continuing to whisper, communication remains minimal, but pt was able to tell RN she was not in pain. Pt vitally stable at this time. Temperature monitored through foley. Remains on Bipap 40% FiO2. Required oral suction occasionally throughout shift. Pt remains on amio drip, per MD instruction. PUP dressing placed on coccyx area, barrier cream applied to buttocks. Heels dressed and elevated to prevent skin breakdown. Q2h turns provided. POC ongoing.   Problem: Safety  Goal: Patient will be injury free during hospitalization  Description: Assess and monitor vitals signs, neurological status including level of consciousness and orientation. Assess patient's risk for falls and implement fall prevention plan of care and interventions per hospital policy.      Ensure arm band on, uncluttered walking paths in room, adequate room lighting, call light and overbed table within reach, bed in low position, wheels locked, side rails up per policy, and non-skid footwear provided.   Outcome: Progressing     Problem: Knowledge Deficit  Goal: Patient/family/caregiver demonstrates understanding of disease process, treatment plan, medications, and discharge instructions  Description: Complete learning assessment and assess knowledge base.  Outcome: Progressing     Problem: Pain  Goal: Pain level within patient's desired comfort-function goal  Outcome: Progressing     Problem: Infection  Goal: Signs and symptoms of infections are decreased or avoided  Description: Assess and monitor patient for signs and symptoms of infection such as redness, warmth, discharge, and increased body temperature. Monitor and report abnormal lab values (ex-CBC and diff, serum protein, serum albumin, and cultures).  Wash hands properly before and after each patient care activity. Utilize standard precautions and use personal protective equipment (PPE) as indicated. Ensure aseptic care of all intravenous  lines and invasive tubes/drains. Obtain immunization and exposure to communicable diseases history. Collaborate with interdisciplinary team and initiate plan and interventions as ordered.  Outcome: Progressing     Problem: Compromised Skin Integrity  Goal: Skin integrity is maintained or improved  Outcome: Progressing

## 2022-03-12 NOTE — Progress Notes (Signed)
Tube feed increased to 40ml/hr.

## 2022-03-12 NOTE — Progress Notes (Signed)
Pt tolerating feeds. Rate increased to 30 ml/hr per order

## 2022-03-12 NOTE — Progress Notes (Addendum)
1830 Assessment done, Noted that [place]  To left nare is quite long.Report was 64 cm but found it to be 38 cm & was connected to tube feeding. Stopped feeding right away. Md informed & requested for a chest xray.   1900 Chest xray done. Awaiting result.

## 2022-03-13 ENCOUNTER — Inpatient Hospital Stay: Admit: 2022-03-14 | Discharge: 2022-03-30

## 2022-03-13 LAB — CBC WITH AUTO DIFFERENTIAL
HCT: 25.5 % — ABNORMAL LOW (ref 35.0–48.0)
Hemoglobin: 8.5 g/dL — ABNORMAL LOW (ref 11.7–16.5)
MCH: 30.6 pg (ref 28.3–33.3)
MCHC: 33.3 g/dL (ref 32.5–36.0)
MCV: 91.8 fL (ref 81.0–100.0)
MPV: 9 fL (ref 6.9–10.0)
Platelet Count: 65 10*3/ÂµL — ABNORMAL LOW (ref 150–405)
RBC: 2.78 10*6/ÂµL — ABNORMAL LOW (ref 3.80–5.60)
RDW: 19.8 % — ABNORMAL HIGH (ref 11.7–16.1)
WBC: 1.8 10*3/ÂµL — ABNORMAL LOW (ref 4.5–11.0)

## 2022-03-13 LAB — COMPREHENSIVE METABOLIC PANEL
ALT - Alanine Aminotransferase: 23 IU/L (ref 5–33)
AST - Aspartate Aminotransferase: 64 IU/L — ABNORMAL HIGH (ref 5–32)
Albumin/Globulin Ratio: 0.8 — ABNORMAL LOW (ref 1.2–2.2)
Albumin: 2.4 g/dL — ABNORMAL LOW (ref 3.5–5.0)
Alkaline Phosphatase: 95 IU/L (ref 35–105)
Anion Gap: 10.1 mmol/L (ref 9.0–18.0)
BUN / Creatinine Ratio: 29.1 — ABNORMAL HIGH (ref 12.0–20.0)
BUN: 52 mg/dL — ABNORMAL HIGH (ref 8–20)
Bilirubin Total: 0.6 mg/dL (ref 0.10–1.70)
CO2 - Carbon Dioxide: 26 mmol/L (ref 17–27)
Calcium: 8.1 mg/dL — ABNORMAL LOW (ref 8.6–10.6)
Chloride: 114.9 mmol/L — ABNORMAL HIGH (ref 101.0–111.0)
Creatinine: 1.79 mg/dL — ABNORMAL HIGH (ref 0.60–1.30)
Glomerular Filtration Rate Estimate (Female): 31 mL/min/{1.73_m2} — ABNORMAL LOW (ref >=60)
Glucose: 157 mg/dL — ABNORMAL HIGH (ref 74–106)
Osmolality Calculation: 317.2 mOsm/kg — ABNORMAL HIGH (ref 275.0–300.0)
Potassium: 3.33 mmol/L — ABNORMAL LOW (ref 3.50–5.10)
Protein Total: 5.6 g/dL — ABNORMAL LOW (ref 6.4–8.2)
Sodium: 151 mmol/L — ABNORMAL HIGH (ref 135–145)

## 2022-03-13 LAB — RENAL FUNCTION PANEL
Albumin: 2.5 g/dL — ABNORMAL LOW (ref 3.5–5.0)
Anion Gap: 13 mmol/L (ref 9.0–18.0)
BUN: 52 mg/dL — ABNORMAL HIGH (ref 8–20)
CO2 - Carbon Dioxide: 22 mmol/L (ref 17–27)
Calcium: 8.2 mg/dL — ABNORMAL LOW (ref 8.6–10.6)
Chloride: 114 mmol/L — ABNORMAL HIGH (ref 101.0–111.0)
Creatinine: 1.54 mg/dL — ABNORMAL HIGH (ref 0.60–1.30)
Glomerular Filtration Rate Estimate (Female): 37 mL/min/{1.73_m2} — ABNORMAL LOW (ref >=60)
Glucose: 146 mg/dL — ABNORMAL HIGH (ref 74–106)
Phosphorus: 2.1 mg/dL — ABNORMAL LOW (ref 2.5–4.7)
Potassium: 4.04 mmol/L (ref 3.50–5.10)
Sodium: 149 mmol/L — ABNORMAL HIGH (ref 135–145)

## 2022-03-13 LAB — CELLAVISION DIFFERENTIAL
Bands %: 1 % (ref 0–2)
Bands, Absolute: 0 10*3/ÂµL
Basophils %: 0 % (ref 0–2)
Basophils, Absolute: 0 10*3/ÂµL (ref 0.0–0.1)
Eosinophils %: 0 % (ref 0–5)
Eosinophils, Absolute: 0 10*3/ÂµL (ref 0.0–0.4)
Lymphocytes %: 17 % — ABNORMAL LOW (ref 20–40)
Lymphocytes, Absolute: 0.3 10*3/ÂµL — ABNORMAL LOW (ref 1.0–4.0)
Monocytes %: 4 % (ref 2–12)
Monocytes, Absolute: 0.1 10*3/ÂµL — ABNORMAL LOW (ref 0.2–1.0)
Neutrophils %: 78 % — ABNORMAL HIGH (ref 50–74)
Neutrophils, Absolute: 1.4 10*3/ÂµL — ABNORMAL LOW (ref 2.0–7.4)
Platelet Estimate: DECREASED — AB
Total Cells Counted: 100
WBC: 1.8 10*3/ÂµL — ABNORMAL LOW (ref 4.5–11.0)
nRBC: 1 /100

## 2022-03-13 LAB — POTASSIUM
Potassium: 3.71 mmol/L (ref 3.50–5.10)
Potassium: 3.6 mmol/L (ref 3.50–5.10)
Potassium: 3.33 mmol/L — ABNORMAL LOW (ref 3.50–5.10)
Potassium: 3.46 mmol/L — ABNORMAL LOW (ref 3.50–5.10)

## 2022-03-13 LAB — C-REACTIVE PROTEIN (SLM): C-Reactive Protein: 255.4 mg/L — ABNORMAL HIGH (ref <5.0)

## 2022-03-13 LAB — PHOSPHORUS: Phosphorus: 3.2 mg/dL (ref 2.5–4.7)

## 2022-03-13 LAB — MAGNESIUM: Magnesium: 1.4 mg/dL (ref 1.3–2.5)

## 2022-03-13 LAB — ERYTHROCYTE SEDIMENTATION RATE, AUTOMATED: Erythrocyte Sedimentation Rate, Automated: 107 mm/hr — ABNORMAL HIGH (ref 0–20)

## 2022-03-13 MED ORDER — potassium chloride IVPB 20 mEq/100 mL
20 | Freq: Once | INTRAVENOUS | Status: AC
Start: 2022-03-13 — End: 2022-03-13
  Administered 2022-03-13: 09:00:00 20 meq via INTRAVENOUS
  Administered 2022-03-13: 10:00:00 20100 meq via INTRAVENOUS

## 2022-03-13 MED ORDER — potassium chloride IVPB 20 mEq/100 mL
20 | Freq: Once | INTRAVENOUS | Status: AC
Start: 2022-03-13 — End: 2022-03-13
  Administered 2022-03-13: 22:00:00 20100 meq via INTRAVENOUS
  Administered 2022-03-13: 21:00:00 20 meq via INTRAVENOUS

## 2022-03-13 MED ORDER — potassium chloride IVPB 20 mEq/100 mL
20 | Freq: Once | INTRAVENOUS | Status: AC
Start: 2022-03-13 — End: 2022-03-13
  Administered 2022-03-13: 23:00:00 20100 meq via INTRAVENOUS
  Administered 2022-03-13: 22:00:00 20 meq via INTRAVENOUS

## 2022-03-13 MED ORDER — potassium chloride IVPB 20 mEq/100 mL
20 | Freq: Once | INTRAVENOUS | Status: AC
Start: 2022-03-13 — End: 2022-03-13
  Administered 2022-03-13: 16:00:00 20 meq via INTRAVENOUS
  Administered 2022-03-13: 17:00:00 20100 meq via INTRAVENOUS

## 2022-03-13 MED ORDER — potassium chloride IVPB 20 mEq/100 mL
20 | Freq: Once | INTRAVENOUS | Status: AC
Start: 2022-03-13 — End: 2022-03-13
  Administered 2022-03-13: 16:00:00 20100 meq via INTRAVENOUS
  Administered 2022-03-13: 14:00:00 20 meq via INTRAVENOUS

## 2022-03-13 MED ORDER — potassium chloride IVPB 20 mEq/100 mL
20 | Freq: Once | INTRAVENOUS | Status: AC
Start: 2022-03-13 — End: 2022-03-12
  Administered 2022-03-13: 03:00:00 20 meq via INTRAVENOUS
  Administered 2022-03-13: 04:00:00 20100 meq via INTRAVENOUS

## 2022-03-13 NOTE — Other (Signed)
SPEECH LANGUAGE PATHOLOGY BRIEF NOTE - INPATIENT     Patient name: Brandy [name]    Checked in with RN, who reported now is not a good time for a swallow evaluation because pt does not want to come off Bi-Pap.   RN to call SLP if pt becomes appropriate for eval later today.    Speech Language Pathologist:  Wyatt Haste, CCC-SLP  Date: 03/13/2022  Time:  8:39 AM

## 2022-03-13 NOTE — Progress Notes (Signed)
Wound/Ostomy Care:  ?  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  ?  Wound Care consulted/following for evaluation and care of wounds on back and buttocks.  ?  Today: Patient awake and alert, nods head appropriately to questions, follows commands as able but is extremely weak. Left heel Deep Tissue Pressure Injury noted. Patient has numerous ecchymoses all over her body. Moisture injury of the skin with yeast component under the abdominal pannus, within the bilateral inguinal folds and also within the gluteal cleft. Nystatin powder ordered, add SkinFold Dry Sheets. Right dorsal foot area of non-blanching purple discoloration which may represent a pressure injury or ecchymosis. Husband at bedside.     10/2 - Patient awake and alert, asking for ice chips. On O2 mask. Left heel Deep Tissue Pressure Injury stable at this time. Offloaded. Awaiting final outcome of demarcation.   ?  O: Blood pressure 133/86, pulse 104, temperature 37.4 ?C (99.3 ?F), temperature source Foley, resp. rate 16, height 5' 3 (1.6 m), weight 243 lb 9.6 oz (110.5 kg), SpO2 98 %, not currently breastfeeding.    See assessment flowsheet for full wound details.   ?  Photos- 03/13/2022  ?    Left heel  ?    Gluteal cleft - photo from 9/27  ?  Braden Scale score - 11, low risk  ?  A: Left heel Deep Tissue Pressure Injury, present on admission.  Gluteal cleft Irritant Contact Dermatitis with linear partial thickness tissue loss 2/2 incontinence of stool and presence of yeast.  Abdominal pannus and bilateral inguinal fold Irritant Contact Dermatitis with linear partial thickness tissue loss 2/2  excess skin moisture and presence of yeast.  Numerous ecchymoses on the back, buttocks and extremities.  ?  P: Local wound and skin care, moisture barrier, antifungal powder, pressure offloading.  ?  Intertrigo -   Cleanse affected areas with bath cloths and dry well.   Apply Nystatin powder to skin folds and rub in well, Q 12 hours. Tuck Skinfold dry sheets into skin folds.  Apply Clear Aid moisture barrier ointment to gluteal cleft and perineum twice daily and PRN.  Apply Allevyn sacral silicone dressing to sacral area for protection/prevention.  ?  Left heel -   Cleanse with wound cleanser spray and pat dry.  Wipe periwound skin with skin barrier/prep wipe and allow to dry.  Apply Allevyn silicone bordered foam dressing and change Q 3 days and PRN.  ?  Provide pressure offloading measures; recommend Waffle air mattress overlay, turn/reposition Q 2 hours and PRN using pillows/wedges to offload the sacrum, float heels off the bed surface using pillows or boots, pad bony prominences.?  ?  Will follow.  ?  Thank you.  ?  K. Dareen Piano, BSN, RN, [employer]  [phone]

## 2022-03-13 NOTE — Consults (Signed)
INPATIENT MEDICAL NUTRITION THERAPY     Brandy [name] is a 66 y.o. female, DOB [birth year]    Reason for assessment: TF  Diagnosis: septic shock  Pertinent medical history: CHF, colon cancer and metastatic breast cancer      NUTRITION ASSESSMENT    Anthropometric Measurements:   Ht: 160 cm (63)         Initial Wt: 103.4 kg (228 lb)   Updated Wt: 110.5 kg  IBW: 64 kg   %IBW: 172   BMI: 43.15  Nutrition Ht/Wt Status: morbid obesity   % wt loss: none, pt is currently fluid positive     Wt hx:   Wt Readings from Last 10 Encounters:   03/13/22 110.5 kg (243 lb 9.6 oz)   02/23/22 107.8 kg (237 lb 11.2 oz)   02/23/22 108.1 kg (238 lb 4.8 oz)   02/09/22 107.1 kg (236 lb 1.6 oz)   02/02/22 107.8 kg (237 lb 11.2 oz)   01/19/22 110.1 kg (242 lb 11.2 oz)   12/29/21 112.1 kg (247 lb 3.2 oz)   12/08/21 113.2 kg (249 lb 9.6 oz)   12/01/21 112.9 kg (249 lb)   11/23/21 111.2 kg (245 lb 3.2 oz)         Biochemical Data, Medical Tests, Procedures:  Lab Results   Component Value Date/Time    NA 151 (H) 03/13/2022 04:40 AM    K 3.46 (L) 03/13/2022 12:55 PM    CL 114.9 (H) 03/13/2022 04:40 AM    CO2 26 03/13/2022 04:40 AM    GLU 157 (H) 03/13/2022 04:40 AM    BUN 52 (H) 03/13/2022 04:40 AM    CREATININES 1.79 (H) 03/13/2022 04:40 AM    LABCREA 77 01/28/2016 06:36 PM    MG 1.4 03/13/2022 04:40 AM    PHOS 3.2 03/13/2022 04:40 AM    AST 64 (H) 03/13/2022 04:40 AM    ALT 23 03/13/2022 04:40 AM       Lab Results   Component Value Date/Time    HGB 8.5 (L) 03/13/2022 04:40 AM    HCT 25.5 (L) 03/13/2022 04:40 AM    MCV 91.8 03/13/2022 04:40 AM       Lab Results   Component Value Date/Time    CHOL 123 02/08/2018 02:06 PM    HDL 30 (L) 02/08/2018 02:06 PM    LDLCALC 55 02/08/2018 02:06 PM    TRIG 192.0 (H) 02/08/2018 02:06 PM       Lab Results   Component Value Date/Time    POCGLU 111 (H) 02/15/2016 11:12 AM    POCGLU 118 (H) 02/10/2016 07:44 PM    POCGLU 91 02/10/2016 04:05 PM    POCGLU 162 (H) 02/10/2016 12:17 PM    POCGLU 102 (H) 02/10/2016  07:57 AM    GLYCOA1C 5.5 07/27/2020 04:32 PM       GFR Estimate   Date Value Ref Range Status   02/24/2016 >60 >=60 mL/min/1.61m*2 Final   02/21/2016 >60 >=60 mL/min/1.66m*2 Final   02/18/2016 >60 >=60 mL/min/1.49m*2 Final     Glomerular Filtration Rate Estimate   Date Value Ref Range Status   03/06/2016 >60.0 >=60.0 mL/min/1.23m*2 Final   01/23/2016 >60.0 >=60.0 mL/min/1.61m*2 Final   01/23/2016 >60.0 >=60.0 mL/min/1.40m*2 Final     Glomerular Filtration Rate Estimate (Female)   Date Value Ref Range Status   02/28/2021 79 >=60 mL/min/1.57m*2 Final   09/08/2020 83 >=60 mL/min/1.74m*2 Final   07/27/2020 87 >=60 mL/min/1.85m*2 Final   10/04/2018 >60.0 >=60.0 mL/min/1.54m*2  Final   06/25/2018 59.4 (L) >=60.0 mL/min/1.42m*2 Final   04/05/2018 >60.0 >=60.0 mL/min/1.8m*2 Final        Current scheduled meds include:   . acetaminophen (TYLENOL) suppository  650 mg Rectal Q6H   . ampicillin-sulbactam (UNASYN) IV  3 g Intravenous Q12H   . doxycycline (VIBRAMYCIN) IVPB  100 mg Intravenous Q12H   . nystatin   Topical 2 times per day   . pantoprazole (PROTONIX) 40 mg in dextrose 5 % (D5W) 100 mL IVPB  40 mg Intravenous Daily   . polyvinyl alcohol-povidone(PF)  1 drop Both Eyes 4x Daily while awake   . potassium chloride in water  20 mEq Intravenous Once    Followed by   . potassium chloride in water  20 mEq Intravenous Once     . amiodarone infusion 0.5 mg/min (03/13/22 1234)       PRN Meds include:  albuterol, calcium carbonate, HYDROmorphone, lidocaine, lidocaine, LORazepam, magnesium hydroxide, naloxone (NARCAN) injection 0.04 mg, naloxone, ondansetron **OR** ondansetron, polyethylene glycol, senna-docusate, sodium chloride 0.9 %, sodium chloride 0.9 %, sodium chloride 0.9 %, sulfur hexafluoride    Food allergies: none  Other Allergies: Bee sting [allergen ext-venom-honey bee], Heparin, and Sulfa (sulfonamide antibiotics)    Food/Nutrition-Related History:  Diet Orders:   Procedures   . Diet Consistency: NPO         Date of  Last Bowel movement: 10/2    Skin Concerns: edema noted on all extremities    Estimated Needs  Protein: 85 g (1.3 g/kg)  Energy: 1800 kcal (28 kcal/kg)  Fluid: 1800 mL (1 mL/kcal)    PO Intake: NPO    Pt started on TF for 5 days NPO. Adjusted original MD order. Note pt with some refeeding from prolonged NPO status. Mag and phos remain WNL, but K is low and being replaced. Will increase rate slowly given refeeding. Pt and family with no questions, except pt hoping to get some food soon. Discussed that is per SLP and her swallow function, pt understanding. Discussed that TF will provide all nutrition while NPO.          EDUCATION  ? Education topics/handouts provided: none this visit  ? Barriers to learning: n/a  ? Comprehension: n/a  ? Receptivity: n/a  ? Expected compliance: n/a    NUTRITION DIAGNOSIS  Inadequate oral intake related to septic shock, dysphagia as evidenced by inability to safely swallow, need for TF.     These characteristics are NOT indicative of malnutrition at this time. However, pt at risk for malnutrition pending trend of mild characteristics.     NUTRITION INTERVENTION  TF: osmolite 1.2 @63  mL/hr - this to provide 1800 kcal, 84 g pro, 1240 mL free water  Water flushes per MD - 300 mL q 2 hrs     MONITORING & EVALUATION  Wts, diet advancement, nutrition related labs, POC     Follow Up: 1-3 days    Pearson Forster, RD, LD  03/13/2022  2:55 PM  [facility]  Available on [employer] or [phone]

## 2022-03-13 NOTE — Other (Signed)
Inpatient Speech pathology - Swallowing evaluation    [name] / [id number] DOB:  1955-10-13 / 66 y.o. 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 Patient referred for: stroke/suspected stroke or TIA, new onset dysphagia     Pain   None reported    Hx of current problem  Pt presented to [facility] with altered mental status and is admitted for acute encephalopathy, septic shock 2/2 MSSA bacteremia, and subarachnoid hemorrhage. 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 Apparently had pneumonia present upon arrival.      Pt has apparently demonstrated some swallowing difficulty and has intermittently required oral suctioning and deep suctioning.  She currently has an NG feeding tube (small bore.)    Pt reported dry mouth and requested sips of water multiple times.     Prior LOF  Normal Swallow function at baseline; per report of patient/family., Baseline Diet: regular textures, per report of pt     Relevant Imaging:   X-ray chest, yesterday:  FINDINGS:  LINES/TUBES: RIGHT internal jugular approach central line terminates in the superior vena cava.  Feeding tube is in the distal aspect of the stomach.  HEART SIZE: Moderate cardiomegaly.  Prosthetic cardiac valve  MEDIASTINUM: Median sternotomy  LUNGS/PLEURA: Patchy airspace disease in both mid and lower lung zones, may be slightly improved within the RIGHT lung base.  Interstitial changes in both lungs persist.  OTHER: None.  IMPRESSION:  1. See above.    CT head  (03/07/22):  FINDINGS:  INTRACRANIAL HEMORRHAGE:  Trace focal subarachnoid hemorrhage noted in a sulcus overlying the right superior parietal lobe (coronal 39).  VENTRICLES and EXTRA-AXIAL SPACES:  No hydrocephalus.  No extra-axial collection. The basal cisterns are patent.  BRAIN:  No mass effect or midline shift.  Subcentimeter hypodense focus in the left posterior cerebellar hemisphere, possibly chronic infarct.  There is age-related global volume loss. Nonspecific areas of periventricular and subcortical white matter   hypoattenuation likely due to age-related chronic microvascular disease.  BONES and SOFT TISSUES:  Unremarkable.  COMMUNICATION  The critical information above was relayed directly by me by telephone to Brandy [name] immediately upon discovery on 03/07/2022 at 12:07 pm.  IMPRESSION:  -Trace focal subarachnoid hemorrhage noted in a sulcus overlying the right superior parietal lobe, source unclear.  Recommend clinical correlation and further evaluation with CT angiogram of the head and/or contrast-enhanced MRI of the brain, as   appropriate.  -No hydrocephalus or mass effect.  -Nonspecific subcentimeter hypodense focus in the left posterior cerebellar hemisphere, possibly chronic infarct.  -Chronic age-related changes.  ?    OBJECTIVE      Chart Review of most recent vital signs and labs: afebrile, WBC is low, CO2 is WNL, RR is elevated, SPO2% normal and pulse normal    Recent Intubation: No  Mental status  Alert, pleasant, answered questions appropriately and followed simple commands   Respiratory Status:  shortness of breath, abnormal respiratory rate   O2 Status:  oxymizer and liters per minute: 7.  (Pt was on Bi-Pap prior to swallow eval.  RT removed Bi-Pap and managed O2 for patient.) Pt maintained O2 saturations in high 90s throughout swallow eval.   Oral Motor Assessment  Lips: retracted and rounded symmetrically   Tongue: protruded at midline, lateralized well to both sides. Suspect weakness  as evidenced by imprecise articulation   Velum: not observed   Mandible: WFL  Oral Mucosa: dry mucosa  Dentition: edentulous     Motor Speech/Voice Screen: Impaired- mod-severe dysarthria with significantly reduced speech intelligibility, significant articulatory imprecision     Swallowing Assessment:    Current Solids/Liquid diet: NPO.   NG feeding tube.    Items presented: ice chips and thin liquid  (1/2 tsp bolus size via spoon, multiple trials.)    Oral Phase: left anterior bolus loss, holding of bolus, prolonged oral transit    Pharyngeal Phase: multiple swallows per bolus    Esophageal Signs/Symptoms: None    Signs/symptoms penetration/aspiration: change in vocal quality, wet vocal quality and s/s penetration/aspiration observed with very small sips of thin water via spoon (intermittent)           Education and Team Collaboration  Instructed patient/family regarding results/recommendations, instructed RN regarding results/recommendations, compensatory swallowing strategies, aspiration pneumonia prevention and oral hygiene     Safety/Room Set-up   Patient was elevated in bed to upright position for evaluation, Oral suctioning available at bedside, Oral care provided by SLP prior to evaluation, Vitals and O2 sats monitored by SLP throughout evaluation, Respiratory Therapist present during evaluation     ASSESSMENT     Clinical Impressions: moderate-severe oropharyngeal dysphagia, characterized by: reduced lip seal (with anterior bolus loss of water from lips on L), multiple swallows per bolus (which may indicate oral and/or pharyngeal residue post swallow, intermittent s/s aspiration with small sips of thin liquid (slightly wet vocal quality, which cleared when pt was cued to throat clear and swallow again.) Pt demonstrated rapid respiratory rate and discoordination of respiration and swallowing.      Patient's aspiration pneumonia risk factors: numerous, chronic illness, dysphagia, respiratory disease and  immobility/inactive state    Patient's current deficits in: swallow function and coordination of respiration and swallowing have the potential to cause/contribute to: increased risk of aspiration or choking, inadequate nutrition/hydration, decrease in overall health and well-being and decreased quality of life. Skilled dysphagia management is required to: maximize patient's swallow safety and maximize comfort/quality of life for patient though PO intake    Prognosis for improved swallow function and/or safety with compensatory swallowing strategies and/or swallow retraining: fair    Recommendations:   Diet recommendations:  NPO, ice chips ok for comfort/swallowing practice;  1/2 tsp sips of thin water via spoon ok, provided by RN after excellent oral care    Compensatory Strategies: Excellent/frequent oral hygiene     Medications: Not safe to give orally     Additional SLP diagnostic/treatment considerations: none at this time     Consult recommendations  Palliative care consult may be beneficial   Discharge recommendations  unknown (depending on patient progress), possible TCU        PLAN     Treatment Plan Frequency   Dysphagia therapy to include: swallowing assessments and treatments as indicated, compensatory strategy training and patient/family and RN education  5x per week  during Inpatient stay.  Frequency will be modified as pt's condition or discharge plan indicates.    Brandy [name] will remain on the Speech Pathology schedule until goals are met, or there has been a change in the Plan of Care.     GOALS     Goal 1:  Patient will tolerate pureed (IDDSI 4) diet textures and nectar consistency (IDDSI 2) liquids with swallow WFL for safe and adequate nutrition/hydration.              This note to serve as a Discharge Summary Brandy [name] is discharged from the hospital or from therapy services.     Therapist: Wyatt Haste, SLP

## 2022-03-13 NOTE — Progress Notes (Signed)
PCU Progress Note  [facility]   Name: Brandy [name]  DOB: 1955/07/04 66 y.o.  MRN: [id number]  CSN: [id number]  [id number] Mountz, DO    Subjective:     Brandy [name] is a 66 y.o. 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    Interim events: NG tube was replaced as it was found to have come out 20 cm. Tube feeding and increasing towards goal rate.    REVIEW OF SYSTEMS:  Denies pain, shortness of breath, and palpitations    Objective:     Vitals Current 24 Hour Min / Max      Temp    37 ?C (98.6 ?F)    Temp  Min: 36.6 ?C (97.8 ?F)  Max: 38.4 ?C (101.1 ?F)      BP     113/72     BP  Min: 94/52  Max: 142/87      HR    97    Pulse  Min: 87  Max: 118      RR    20    Resp  Min: 0  Max: 38      Sats      SpO2: 99 % on  L/min       SpO2  Min: 82 %  Max: 100 %      Weight    110.5 kg (243 lb 9.6 oz)  Body mass index is 43.15 kg/m?Marland Kitchen    Admit: 103.4 kg (228 lb)   Intake/Ouput:    Intake/Output Summary (Last 24 hours) at 03/13/2022 0556  Last data filed at 03/13/2022 0447  Gross per 24 hour   Intake 1714.6 ml   Output 3050 ml   Net -1335.4 ml      LDAs:   Triple lumen right IJ CVC  Foley  pIV x2    Scheduled Medications:  . acetaminophen (TYLENOL) suppository  650 mg Rectal Q6H   . ampicillin-sulbactam (UNASYN) IV  3 g Intravenous Q12H   . doxycycline (VIBRAMYCIN) IVPB  100 mg Intravenous Q12H   . furosemide  40 mg Intravenous 2 times per day   . nystatin   Topical 2 times per day   . pantoprazole (PROTONIX) 40 mg in dextrose 5 % (D5W) 100 mL IVPB  40 mg Intravenous Daily   . polyvinyl alcohol-povidone(PF)  1 drop Both Eyes 4x Daily while awake     Infusions:   .  amiodarone infusion 0.5 mg/min (03/13/22 0447)     PRN Medications: albuterol, calcium carbonate, HYDROmorphone, lidocaine, lidocaine, LORazepam, magnesium hydroxide, naloxone (NARCAN) injection 0.04 mg, naloxone, ondansetron **OR** ondansetron, polyethylene glycol, senna-docusate, sodium chloride 0.9 %, sodium chloride 0.9 %, sodium chloride 0.9 %, sulfur hexafluoride    Code Status: DNR    PHYSICAL EXAMINATION:   Gen: NAD, pleasant, speaking in full sentences and asking questions, difficult to understand with BiPAP on  HENT: EOM intact, Conjunctiva normal. BIPAP on.  CV: RRR, -m/r/g  Pulm: On BIPAP, CTAB  Abd: NT/ND, normal BS  Ext: pale, cap refill <2 sec. Trace edema in lower limbs. Knees are warm to touch bilaterally, no erythema.  Skin: Scattered ecchymoses and scabs. No open wounds (with exception of wound at gluteal cleft which appears unchanged),  and no areas of fluctuance.    Recent Labs:   Results from last 7 days   Lab Units 03/13/22  0440 03/12/22  0715 03/11/22  1512 03/11/22  0704 03/10/22  1812 03/10/22  0532 03/09/22  4098 03/08/22  1512 03/08/22  0641 03/08/22  0000 03/07/22  1707 03/07/22  1103   WBC 10*3/?L 1.8*  1.8* 1.8*  1.8*  --  1.7*  1.7*  --  2.9*  2.9* 4.4*  4.4* 6.3  6.3 8.4  8.4 10.5  10.5  --  4.9  4.9   HGB g/dL 8.5* 9.0* 8.9* 7.6* 8.5* 7.3* 7.2* 6.9* 7.2* 7.3* 7.5* 9.1*   MCV fL 91.8 92.6  --  91.8  --  92.3 91.2 92.0 93.1 92.8  --  92.2   NEUTROS PCT % 78* 76*  --  87*  --  95* 96* 95* 93* 91*  --  91*   PLATELETS 10*3/?L 65* 57*  --  33*  --  31* 35* 42* 40* 47*  --  68*     Results from last 7 days   Lab Units 03/13/22  0440 03/12/22  2328 03/12/22  1811 03/12/22  1300 03/12/22  0716 03/11/22  1512 03/11/22  0704 03/10/22  1644 03/10/22  1118 03/10/22  0532 03/09/22  1191 03/08/22  1512 03/08/22  0641 03/08/22  0000 03/07/22  1707 03/07/22  1103   SODIUM mmol/L 151*  --   --   --  148*  --  148* 146* 146* 144 141 138 139 133* 138 137   POTASSIUM mmol/L 3.33*  3.33* 3.60  3.71 3.51 3.59 3.64 3.29* 3.52 3.62 3.60 4.04 4.01 4.11 3.95 3.56 3.74   CHLORIDE mmol/L 114.9*  --   --   --  114.1*  --  113.6* 111.8* 112.7* 111.3* 109.1 105.5 106.2 100.9* 103.3 100.8*   CO2 mmol/L 26  --   --   --  21  --  21 20 20 20  16* 15* 15* 16* 13* 17   GLUCOSE mg/dL 478*  --   --   --  295*  --  131* 132* 108* 116* 116* 121* 163* 192* 150* 142*   BUN mg/dL 52*  --   --   --  57*  --  63* 67* 67* 69* 78* 78* 71* 70* 63* 65*   CREATININESERUM mg/dL 6.21*  --   --   --  3.08*  --  2.18* 2.43* 2.49* 2.54* 3.50* 3.61* 3.96* 3.85* 4.09* 4.21*   CALCIUM mg/dL 8.1*  --   --   --  8.2*  --  7.8* 7.6* 7.6* 7.2* 6.9* 6.9* 7.1* 6.9* 7.0* 8.1*   MAGNESIUM mg/dL  --   --   --   --   --   --   --   --  1.7  --   --   --   --   --   --  1.6   PHOSPHORUS mg/dL  --   --   --   --   --   --   --   --   --  3.6  --   --   --   --   --  4.1     Results from last 7 days   Lab Units 03/13/22  0440 03/12/22  0716 03/11/22  0704 03/10/22  0532 03/09/22  0614 03/08/22  0641 03/07/22  1103   AST IU/L 64* 77* 91* 123* 180* 238*  237*   ALT IU/L 23 27 32 44* 55* 64* 74*   ALK PHOS IU/L 95 72 67 69 73 82 61   PROTEIN TOTAL g/dL 5.6* 5.6* 5.2* 5.1* 5.3* 5.5* 7.0   ALBUMIN g/dL 2.4* 2.1* 2.1* 2.3* 2.3* 2.6* 3.4*   A/G RATIO  0.8* 0.6* 0.7* 0.8* 0.8* 0.9* 0.9*         Results from last 7 days   Lab Units 03/10/22  1644 03/10/22  1118   TROPONIN T ng/mL 0.37* 0.37*     Results from last 7 days   Lab Units 03/07/22  1103   INR  1.3*   PROTIME Seconds 14.4*         Recent Imaging:  X-ray abdomen AP    Result Date: 03/11/2022  EXAMINATION: XR ABDOMEN AP HISTORY: Assess proper placement of NGT COMPARISON: None. FINDINGS: BOWEL GAS PATTERN: Non-obstructive. SUBPHRENIC GAS: None identified. RENAL CALCULI: None. SURGICAL CHANGES: Feeding tube tip overlies the distal antrum of the stomach. OTHER: None.     IMPRESSION: 1. Feeding tube tip overlies the distal antrum of the stomach. NOTE: This dictation was produced using voice recognition software.  Although effort has been made to minimize transcription errors, homonyms and other transcription errors may be present and may not truly reflect my intent. Please excuse any unintentional verbiage, or grammatical errors. Dictated by: Junious Dresser Electronically Signed by: Junious Dresser on 03/11/2022 4:40 PM Report ID#: [id number]     Assessment & Plan:   ASSESSMENT:  Patient Active Problem List    Diagnosis   . Subarachnoid hemorrhage (HCC)   . Acute encephalopathy   . Septic shock (HCC)   . Acute pyelonephritis   . Acute renal failure, unspecified acute renal failure type (HCC)   . SAH (subarachnoid hemorrhage) (HCC)   . Encephalopathy acute   . History of amputation of right great toe (HCC)   . Menopausal syndrome on hormone replacement therapy   . Long term (current) use of aromatase inhibitors   . HER2-positive carcinoma of breast (HCC)   . Thickened endometrium   . Malignant neoplasm of hepatic flexure (HCC)   . Axillary adenopathy   . 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 Anaphylaxis   . Back pain, thoracic   . Hyperparathyroidism (HCC)   . Colon cancer (HCC)   . Aortic valve disorder   . Heparin induced thrombocytopenia (HCC)   . Non-ischemic cardiomyopathy (HCC)   . Demand ischemia (HCC)   . Obesity   . Iron deficiency anemia   . Hilar lymphadenopathy- bilataeral    . Liver lesion- small hypodense lesion around falciform lig     Brandy [name] is a 66 y.o. 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     CARDIOVASCULAR:   #Severe Aortic Stenosis s/p Bioprosthetic Aortic Valve - 2017  POA.  Complicates care if bacteremic. STAT ECHO shows normally functioning prosthetic valve with no signs of vegetation.  - Discussed with cardiology and appreciate recommendations to Repeat TTE on 10/3 and depending on quality of images, could consider    #Supraventricular Tacycardia  Developed on 9/29, lasted a few minutes and terminated without intervention. Ddx includes  MI, PE, a fib, other arrhythmia, sepsis, anemia, electrolyte abnormality. History of afib. New ST depressions in V3 and V6 on EKG. Troponin of 0.37 > 0.37. Dr. Marcene Brawn consulted, greatly appreciate recommendations.   - Amiodarone bolus and gtt. Per cardiology, continue infusion for 24 hrs then stop (10/2). If arrhythmia occurs, will require long acting antiarrhythmic.?    #HFrEF   POA. Does not appear in exacerbation, appears to have improved following valve replacement in 2017. STAT ECHO 9/26 shows EF 40-45%.  - Holding carvedilol, atorvastatin, ASA while acutely ill. Can resume when able to swallow.     PULM:   #Acute Hypoxic Respiratory Failure   POA. No baseline O2 use. No known lung disease. S/p NG to decompress stomach after bagging by EMS.  - RTDP  - O2 to keep sats >90%    ID:   #MSSA bacteremia of unclear source  POA. Ongoing fevers as of 10/01. Blood cultures again positive 9/28. ID consult 9/29: Discussed the case with Dr. Bertell Maria at [facility] in [place] who recommended continuation of antibiotics to cover MSSA, Klebsiella, and atypicals, with removal of port and further imaging only if blood cultures are positive again, with the exception of TEE, which he strongly recommends given bovine aortic valve. Blood cultures turned positive and Dr. Broadus Brandy [name] graciously removed the port on 9/30 for source control. Antibiotic course:   - Ceftriaxone, 9/26, 9/28-29  - Cefepime 9/26-9/28  - Vancomycin 9/26-29  - Cefazolin 9/29  - Unasyn 9/29 -   - Doxycycline 9/29 -   Daily blood cultures  Consider removal of CVC in coming days  Discussed indication forTEE  with Dr. Harlen Labs. Appreciate his recommendations for TTE 10/3 and possible TEE on 10/5 to assess for endocarditis.  Continue scheduled tylenol    #UTI (resolved)  POA. Asymptomatic. Culture grew Klebsiella pan-sensitive. For source control, replaced foley 9/28.    #Community acquired pneumonia   POA. R lower lobe. Repeat CXR 9/29 appears worse, most consistent with pulmonary edema given clinical presentation of hypervolemia.  - ID consult: appreciate recommendations to cover atypicals in case of pneumonia.    #Intrigenous candidal infection  - nystatin powder and Skinfold dry sheets    ENDO:   #Hyperparathyroidism s/p parathyroidectomy 2019  Complicates care. Monitor electrolytes.     RENAL:   #Acute Renal Failure   POA. Cr normal at baseline, 4.21 on admission. Renal US showed no solid mass or hydronephrosis, though CT showed calculus in bladder that may have recently passed. Creatinine improving s/p fluid resuscitation.   - Foley, strict I/O   - Pharmacy consulted to review medications     GI/FEN:   #Acute Hepatitis   POA. Likely shock liver. RUQ US shows diffuse increased echogenicity of the liver which could indicate fatty infiltration, hepatitis, or cirrhosis. LFTs are trending downward.    #Hypervolemia  #Free Water Deficit  Fluid overloaded with pulmonary edema on CXR 9/29.  Na trending up, up to 151 10/2. Maintenance fluids D5W 9/29. NG tube placed 9/30 and given free water enterally. 3.9 L free water deficit 10/2.  - 300 mL free water q2hrs  - Continue to trend Na and titrate free water accordingly   - Titrating furosemide  - Referral placed for SLP and will evaluate when indicated  - discontinue furosemide    NEURO:   #Acute Metabolic Encephalopathy   POA. Likely due to severe sepsis, GCS initially 3. Treating the underlying causes. Intubation discussed at length with the husband, decided DNR/DNI. By  9/27 was moving spontaneously, whispering single words, and able to answer questions. Improved 10/1 to  speaking in full sentences and initiating conversation.  - Dilaudid PRN for pain   - MRI w/ contrast to evaluate for causes of encephalopathy or SAH.  ?  #Trace Non-Aneurysmal Subarachnoid Hemorrhage  POA. Seen on CT head on admission. Discussed with Neurosurgery by ED physician, gave no recommendations for management.   - Seizure precautions   - Avoid anticoagulation for 4 weeks   - consider brain MRI to evaluate for metastases/other causes of encephalopathy or SAH. GFR > 30, so contrast can be used     SKIN/MSK: ?  #Pressure Injuries, Ecchymosis  POA. Multiple and scattered across back.   - Wound care consultation  - Frequent position changes   - thorough skin check for abscess or other infection source    HEME/ONC:   #Pancytopenia   #Hx HIT  #Coagulopathy of Sepsis  Developed over hospital course. At baseline, has iron deficiency anemia and chemotherapy-induced anemia. Hgb intermittently dropped, requiring blood transfusions, now s/p 3upRBCs. On 9/30, ANC 1500.   - Neutropenic precautions  - Continue to trend H&H  - TT Hgb <8  - Avoid heparin   - Monitor for bleeding   ?  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 Following with Dr. Nadeen [facility] at [facility], currently on Docetaxel/Trastuzumab. Metastatic to lymph node. Last treatment 02/09/2022. Required blood transfusions due to chemotherapy.   ?  #Colon cancer s/p colectomy 2018  Complicates care, bowel regimen.     Resolved Hospital Problems  #Septic Shock: Severe and refractory. Appropriately fluid resuscitated. S/p stress dose steroids. Levophed 9/26-9/27.  #Respiratory Alkalosis: Tachypneic due to sepsis/hyperthermia.  #Rhabdomyolysis: Mild. McMahon Score: high risk. Fluid resuscitated. CK peaked 9/27 at 5,722 and then decreased.  #Anion Gap Metabolic Acidosis: POA. Likely from shock and renal failure. Lactate normal.   #Hyperthermia: Temperature initially nearly 105F, likely contributed to rhabdomyolysis. She was cooled using fans, IVF, APAP.       Feeding: NG feeding tube with vital HP starting at 71ml/hr and icreasing by 10ml q1hr for goal of 70 ml/hr. Tolerating sips of water. Nutrition consultation   Analgesia: Dilaudid PRN  Sedation: None  Thromboprophylaxis: None - subarachnoid hemorrhage   Head of bed: 30 degrees  Ulcer prophylaxis: PPI  Glucose: 5.5% (07/27/2020); 6.0% (01/26/2016); 5.2% (01/21/2016)   Bowel regimen: PRN  I - Lines/Drains/Airway: CVC R IJ 9/26, R radial arterial line 9/26-9/28, NG tube 9/26-9/27, 9/30 Foley 9/26  Deescalate antibiotics - ongoing and complex course, see above    CODE: DNR/DNI per multiple conversations with patient's husband    Verbal MDPOA: Husband, Brandy [name]  DISPO: PCU, guarded    Social: Lives with husband, Brandy [name], who is MDPOA      Cozy Veale   03/13/2022  5:56 AM

## 2022-03-13 NOTE — Other (Signed)
IP PT EVALUATION / INITIAL TREATMENT (GENERAL)    [name] / [id number] DOB:  Feb 21, 1956 / 66 y.o. Date:  03/13/2022     Precautions  Specific mobility precautions: Potential for falls identified by PT team     ASSESSMENT     Summary  Brandy [name] was admitted to the hospital for s/p Septic Shock and is receiving therapy for Impaired functional mobility. At baseline, she walked using a four wheeled walker and lived in a house with spouse.  Overall she had fair tolerance to therapy today she was able to come to the edge of the bed with min-mod assist and complete HEP at edge of bed.  Session was cut short as patient was heading to MRI, but is very eager to participate.  Anticipate next session will attempt to stand with Huntley Dec steady.  Given current mobility as compared to previous baseline will likely require further therapies at TCU prior to returning home with husband.     Discharge recommendations  Primary recommendation: Patient would benefit from SNF/Nursing Home/Other Skilled Facility.     Mobility recommendations  Mobility Equipment needs: To be determined in post-discharge setting  Precaution Awareness: No specific precautions  Deficit Awareness: Fully aware of deficits  Correction of Errors: Self-corrects with cues  Safety Judgment: Unable to assess     PLAN     Treatment Plan Frequency    Plan: Pt. was seen for Initial Evaluation and POC was established. Pt. demonstrates limitations which require skilled PT intervention., Ther Ex, Bed mobility training, Transfer training, Gait training, Stair training, ADL training, Therapeutic Activity training, Pt/caregiver training, DME needs assessment  1x (5 days/week).            Treatment Diagnosis Surgery / # Days Post-op (if applicable)    Primary Dx: s/p Septic Shock  Treatment focus: Impaired functional mobility  Procedure(s) (LRB):  INFUSION CATHETER Removal (N/A) / 2 Days Post-Op       Pertinent Past Medical Hx   has a past medical history of  Arrhythmia, CHF (congestive heart failure) (HCC), Colon cancer (HCC) (12/28/2016), Colonic mass (11/28/2016), Encounter for screening colonoscopy (12/05/2017), History of blood transfusion, HIT (heparin-induced thrombocytopenia) (HCC), Hyperlipidemia, Malignant neoplasm of axillary tail of left breast in female, estrogen receptor positive (HCC) (08/19/2021), Screening for colon cancer (10/04/2016), and Thyroid disease.      SUBJECTIVE   Brandy [name] is a very pleasant 66 year old female presenting s/p septic shock.  Per her report she lives with her husband in a single level home has 4 5 steps to enter.  She reports a baseline being ambulatory with a 4 wheeled walker and is typically ambulatory about her house without difficulty.  She does not drive and relies on her husband for driving, but both her and her visitors report she is able to complete ADLs and meals nearly independently until recently.  Currently reporting bilateral hip pain, chronic low back pain, and a right knee pain.  Eager to attempt mobility.     Pain Level  Current status (PT): Pain somewhat limits pt's ability to participate in therapy (moderate)  Pain at rest: Mild  Pain with activity: Moderate  Pain location:  (R knee bilateral Hips)   Hx of current problem  General symptoms: Pain, Stiffness, Weakness   Social history   Lives: with spouse  Support Systems: Spouse (bad back), family, friends       Prior LOF  Prior mobility status: walked using a four wheeled walker  Home/Community: Pt. was  independent in home/needed assist in community  --> Received help from: Spouse  --> Needed assistance for: Household maintenance (yard work), Driving   Home environment  Living situation: lived in a house  Home style: one level  Number of stairs to enter home: 4-5   Home equipment  Mobility Equipment: Four wheeled walker     OBJECTIVE   Patient was supine in bed upon entry and was agreeable to physical therapy intervention.  SPO2 and heart rate were monitored as  document below, patient remained on 7 L/min O2 throughout session.  Patient was able to slowly bring lower extremities towards the edge of bed, but requiring greater assistance for right lower extremity and ended up requiring min-mod assist to complete supine to sit transfer with head of bed elevated approximately 30 degrees.  Once seated the edge of the bed worked on static and dynamic seated balance, which fluctuated from SBA to min assist.  She was also able to complete seated long arc quads bilaterally x5 with 2 to 3-second holds.  Unfortunately transport came to the room and was taken to patient MRI so she was returned to supine with max/dependent level assistance.  She was positioned for comfort we further discussed benefits of freq mobility, DME needs, and DC plan.  Patient was then left safely in bed with call light within reach all needs addressed this time.     Mental status  General Demeanor: Pleasant, Cooperative  LOC: Alert  Orientation: Oriented x 4  Directions: Follows multi-step commands  --> Follows multi-step commands: Consistently  Attention span: Appears intact  Memory: Appears intact in social/therapy situations   Medical appliances  Medical Appliances: Oxygen, Central line, NG feeding tube, Telemetry, BP monitor, SPO2 monitor, HR monitor   Strength  Lower extremities: Grossly 3 to 4-/5 bilaterally   ROM  ROM  Lower extremities: Limited ROM (R)  Narrative: limited slightly by body habitus   Vitals:     Rest:  Position: Supine   SPO2: 95% on 7 L/min O2   HR: 101 bpm    TREATMENT  Bed Mobility/Transfers  Bed mobility       Transition to sitting up Supine to Sit: Min Assist, Mod Assist, Verbal cues, Tactile cues, Armhold assist in lieu of assistive device   To resting position      Transition to supine Max Assist, Dependent, Verbal cues, Armhold assist in lieu of assistive device     Scooting Dependent      Balance  Balance - Basic  Static sitting balance: Assist level  Assist level: CGA, Min  assist  Dynamic sitting balance: Assist level  Assist level: Min assist      Training/Education  Trainee(s): Primary Learner  Primary Learner: Patient  Primary Learner - Barriers to Learning?: No  Training provided: PT PoC, bed mobility, transfer training, fall prevention, DME needs, and d/c planning  Response to training: q   Safety/Room Set-up   Call button accessible?: Yes  Oxygen reconnected?: No, not disconnected during therapy  Position on arrival: In bed  Position on departure (bed): Standard bed  Comments: RN at bed side     GOALS     Patient/Caregiver goals reviewed and integrated with rehab treatment plan:     Multidisciplinary Problems     Multidisciplinary Problems (Active)        Problem: IP General Goal List - 2    Goal Priority Disciplines Outcome   Bed Mobility     PT    Description:  Pt. to perform bed mobility with min assist.   Transfers     PT    Description: Pt. to perform all functional transfers with min. assist.   Ambulation - Levels     PT    Description: Pt. to ambulate with min assist x 20 feet with appropriate assistive device.   Home Exercise Program     PT    Description: Pt. to perform basic HEP with min assist.   Caregiver Training     PT    Description: Patient/caregiver training will be provided, as needed to achieve goals.                        First session Second session (if applicable)    Start/Stop times: 1441 - 1512 Start/Stop times:   - Stop Time:     Total time: 31 minutes  Total time:   minutes   TIME TOTALS  Timed minutes: 31  Total minutes (All Sessions): 31    This note to serve as a Discharge Summary if Aleya Maloof is discharged from the hospital or from therapy services.     Therapist: Barkley Boards, PT, DPT        Portions of this note were generated with voice recognition software.  Some words and phrases may be phonetically similar, but different from what was actually dictated.  Edits and corrections were performed to the best of my ability when identified during  concurrent proofreading.  Please excuse any unintentional verbiage, homonyms, or grammatical errors that may be present and may not truly reflect my intent.  Feel free to contact me for clarification if any questions arise relating to the wording of this document.

## 2022-03-13 NOTE — Progress Notes (Signed)
Discussed IV access with Dr. Laurey Arrow and dc'ing IJ today. Pt remains on Amiodarone gtt til 2000 tonight, multiple ABX and KRyders. Will readdress tomorrow morning. Notified Shelly IV therapy of plan.  Brandy [name] 12:23 PM 03/13/22

## 2022-03-13 NOTE — Plan of Care (Signed)
Pt A&Ox4,MAE but weak. Given Tylenol per rectum as scheduled. Deep suctioning & oral care done. New Tube feeding patent. Tube feeding tolerated & at goal rate. @ large liquid BM noted. Remain on Amiodarone drip at 0.5mg . Resting comfortably on BIPAp at 40%.    Problem: Safety  Goal: Patient will be injury free during hospitalization  Description: Assess and monitor vitals signs, neurological status including level of consciousness and orientation. Assess patient's risk for falls and implement fall prevention plan of care and interventions per hospital policy.      Ensure arm band on, uncluttered walking paths in room, adequate room lighting, call light and overbed table within reach, bed in low position, wheels locked, side rails up per policy, and non-skid footwear provided.   Outcome: Progressing     Problem: Knowledge Deficit  Goal: Patient/family/caregiver demonstrates understanding of disease process, treatment plan, medications, and discharge instructions  Description: Complete learning assessment and assess knowledge base.  Outcome: Progressing     Problem: Risk for Falls  Goal: No falls during hospitalization  Description: Patient will not fall during hospitalization.  Outcome: Progressing     Problem: Knowledge Deficit  Goal: Knowledge - personal safety  Description: Patient will verbalize understanding of fall prevention.  Outcome: Progressing     Problem: Pain  Goal: Patient will achieve activity goals and show improvement in function, mood, and coping  Outcome: Progressing  Goal: Pain level within patient's desired comfort-function goal  Outcome: Progressing  Goal: Patient will verbalize understanding and be an active participant in the pain management care plan  Outcome: Progressing  Goal: Patient will be able to identify strategies to reduce anxiety and improve coping  Outcome: Progressing     Problem: [place] Braden Scale Score 18 & Lower  Goal: For scores 18 & lower:  Description: For scores 18 &  lower:  Outcome: Progressing     Problem: Insufficient Fluid Volume  Goal: Fluid and electrolyte balance are achieved/maintained  Description: Assess and monitor vital signs (orthostatic vitals if applicable), fluid intake and output, urine color, labs, skin turgor, mucous membranes, mental status, and gastrointestinal system for nausea, vomiting and diarrhea.  Monitor for signs and symptoms of hypovolemia (tachycardia, rapid breathing, decreased urine output, postural hypotension, confusion, syncope).  Collaborate with interdisciplinary team and initiate plan and interventions as ordered.  Outcome: Progressing     Problem: Hemodynamic Status  Goal: Patient's vitals signs are stable  Description: Assess and monitor patient's heart rate, rhythm, respiratory rate, peripheral pulses, capillary refill, color, body temperature, intake and output, labs and physical activity tolerance.   Observe for signs of chest pain (note location, duration, severity, radiation and associated symptoms such as diaphoresis, nausea, indigestion).  Monitor for signs and symptoms of heart failure (eg. shortness of breath, edema of feet/ankles/legs, rapid irregular heart rate, coughing, wheezing, white/pink blood tinged sputum, sudden weight gain, chest pain). Collaborate with interdisciplinary team and initiate plan and interventions as ordered.  Outcome: Progressing     Problem: Insufficient Nutritional Intake  Goal: Patient's nutritional intake is adequate  Description: Assess and monitor food intake and supplements, patient food preferences, nausea, vomiting, labs, oral cavity (gums, teeth, tongue, mucosa), proper denture fit, and cultural beliefs.  Monitor for signs of hypoglycemia and hyperglycemia.  Collaborate with interdisciplinary team and initiate plan and interventions as ordered.  Outcome: Progressing     Problem: Inadequate Mobility/Activity Status  Goal: Mobility/activity is maintained at optimum level for patient  Description:  Assess and monitor patient  barriers  to mobility and need for assistive/adaptive devices. Assess patient's emotional response to limitations. Collaborate with interdisciplinary team and initiate plans and interventions as ordered.  Outcome: Progressing     Problem: Infection  Goal: Signs and symptoms of infections are decreased or avoided  Description: Assess and monitor patient for signs and symptoms of infection such as redness, warmth, discharge, and increased body temperature. Monitor and report abnormal lab values (ex-CBC and diff, serum protein, serum albumin, and cultures).  Wash hands properly before and after each patient care activity. Utilize standard precautions and use personal protective equipment (PPE) as indicated. Ensure aseptic care of all intravenous lines and invasive tubes/drains. Obtain immunization and exposure to communicable diseases history. Collaborate with interdisciplinary team and initiate plan and interventions as ordered.  Outcome: Progressing     Problem: Compromised Skin Integrity  Goal: Skin integrity is maintained or improved  Outcome: Progressing     Problem: Potential for Infection  Goal: Remains infection free  Outcome: Progressing

## 2022-03-13 NOTE — Progress Notes (Signed)
Pt able to swallow small ice chip without difficulty but coughed and sputtered with small spoon of water.   Brandy [name] 12:04 PM 03/13/22

## 2022-03-13 NOTE — Plan of Care (Signed)
Problem: Safety  Goal: Patient will be injury free during hospitalization  Description: Assess and monitor vitals signs, neurological status including level of consciousness and orientation. Assess patient's risk for falls and implement fall prevention plan of care and interventions per hospital policy.      Ensure arm band on, uncluttered walking paths in room, adequate room lighting, call light and overbed table within reach, bed in low position, wheels locked, side rails up per policy, and non-skid footwear provided.   Outcome: Progressing     Problem: Knowledge Deficit  Goal: Patient/family/caregiver demonstrates understanding of disease process, treatment plan, medications, and discharge instructions  Description: Complete learning assessment and assess knowledge base.  Outcome: Progressing

## 2022-03-14 ENCOUNTER — Inpatient Hospital Stay: Admit: 2022-03-14 | Discharge: 2022-04-03

## 2022-03-14 ENCOUNTER — Inpatient Hospital Stay

## 2022-03-14 LAB — CELLAVISION DIFFERENTIAL
Bands %: 1 % (ref 0–2)
Bands, Absolute: 0 10*3/ÂµL
Basophils %: 1 % (ref 0–2)
Basophils %: 0 % (ref 0–2)
Basophils, Absolute: 0 10*3/ÂµL (ref 0.0–0.1)
Basophils, Absolute: 0 10*3/ÂµL (ref 0.0–0.1)
Eosinophils %: 0 % (ref 0–5)
Eosinophils %: 0 % (ref 0–5)
Eosinophils, Absolute: 0 10*3/ÂµL (ref 0.0–0.4)
Eosinophils, Absolute: 0 10*3/ÂµL (ref 0.0–0.4)
Lymphocytes %: 21 % (ref 20–40)
Lymphocytes %: 20 % (ref 20–40)
Lymphocytes, Absolute: 0.4 10*3/ÂµL — ABNORMAL LOW (ref 1.0–4.0)
Lymphocytes, Absolute: 0.3 10*3/ÂµL — ABNORMAL LOW (ref 1.0–4.0)
Monocytes %: 2 % (ref 2–12)
Monocytes %: 4 % (ref 2–12)
Monocytes, Absolute: 0 10*3/ÂµL — ABNORMAL LOW (ref 0.2–1.0)
Monocytes, Absolute: 0.1 10*3/ÂµL — ABNORMAL LOW (ref 0.2–1.0)
Neutrophils %: 76 % — ABNORMAL HIGH (ref 50–74)
Neutrophils %: 75 % — ABNORMAL HIGH (ref 50–74)
Neutrophils, Absolute: 1.4 10*3/ÂµL — ABNORMAL LOW (ref 2.0–7.4)
Neutrophils, Absolute: 1.3 10*3/ÂµL — ABNORMAL LOW (ref 2.0–7.4)
Platelet Estimate: DECREASED — AB
Platelet Estimate: DECREASED — AB
Total Cells Counted: 100
Total Cells Counted: 100
WBC: 1.8 10*3/ÂµL — ABNORMAL LOW (ref 4.5–11.0)
WBC: 1.7 10*3/ÂµL — ABNORMAL LOW (ref 4.5–11.0)

## 2022-03-14 LAB — COMPREHENSIVE METABOLIC PANEL
ALT - Alanine Aminotransferase: 23 IU/L (ref 5–33)
AST - Aspartate Aminotransferase: 53 IU/L — ABNORMAL HIGH (ref 5–32)
Albumin/Globulin Ratio: 0.7 — ABNORMAL LOW (ref 1.2–2.2)
Albumin: 2.3 g/dL — ABNORMAL LOW (ref 3.5–5.0)
Alkaline Phosphatase: 105 IU/L (ref 35–105)
Anion Gap: 10.4 mmol/L (ref 9.0–18.0)
BUN / Creatinine Ratio: 34.1 — ABNORMAL HIGH (ref 12.0–20.0)
BUN: 47 mg/dL — ABNORMAL HIGH (ref 8–20)
Bilirubin Total: 0.6 mg/dL (ref 0.10–1.70)
CO2 - Carbon Dioxide: 24 mmol/L (ref 17–27)
Calcium: 7.9 mg/dL — ABNORMAL LOW (ref 8.6–10.6)
Chloride: 112.6 mmol/L — ABNORMAL HIGH (ref 101.0–111.0)
Creatinine: 1.38 mg/dL — ABNORMAL HIGH (ref 0.60–1.30)
Glomerular Filtration Rate Estimate (Female): 42 mL/min/{1.73_m2} — ABNORMAL LOW (ref >=60)
Glucose: 131 mg/dL — ABNORMAL HIGH (ref 74–106)
Osmolality Calculation: 306.5 mOsm/kg — ABNORMAL HIGH (ref 275.0–300.0)
Potassium: 4.04 mmol/L (ref 3.50–5.10)
Protein Total: 5.6 g/dL — ABNORMAL LOW (ref 6.4–8.2)
Sodium: 147 mmol/L — ABNORMAL HIGH (ref 135–145)

## 2022-03-14 LAB — ABO/RH (HCLL): Rh (D): POSITIVE

## 2022-03-14 LAB — BASIC METABOLIC PANEL
Anion Gap: 10.6 mmol/L (ref 9.0–18.0)
Anion Gap: 14 mmol/L (ref 9.0–18.0)
BUN / Creatinine Ratio: 34.3 — ABNORMAL HIGH (ref 12.0–20.0)
BUN / Creatinine Ratio: 37.3 — ABNORMAL HIGH (ref 12.0–20.0)
BUN: 46 mg/dL — ABNORMAL HIGH (ref 8–20)
BUN: 44 mg/dL — ABNORMAL HIGH (ref 8–20)
CO2 - Carbon Dioxide: 24 mmol/L (ref 17–27)
CO2 - Carbon Dioxide: 20 mmol/L (ref 17–27)
Calcium: 8 mg/dL — ABNORMAL LOW (ref 8.6–10.6)
Calcium: 7.9 mg/dL — ABNORMAL LOW (ref 8.6–10.6)
Chloride: 111.4 mmol/L — ABNORMAL HIGH (ref 101.0–111.0)
Chloride: 111 mmol/L (ref 101.0–111.0)
Creatinine: 1.34 mg/dL — ABNORMAL HIGH (ref 0.60–1.30)
Creatinine: 1.18 mg/dL (ref 0.60–1.30)
Glomerular Filtration Rate Estimate (Female): 44 mL/min/{1.73_m2} — ABNORMAL LOW (ref >=60)
Glomerular Filtration Rate Estimate (Female): 51 mL/min/{1.73_m2} — ABNORMAL LOW (ref >=60)
Glucose: 138 mg/dL — ABNORMAL HIGH (ref 74–106)
Glucose: 134 mg/dL — ABNORMAL HIGH (ref 74–106)
Osmolality Calculation: 304.7 mOsm/kg — ABNORMAL HIGH (ref 275.0–300.0)
Osmolality Calculation: 301.9 mOsm/kg — ABNORMAL HIGH (ref 275.0–300.0)
Potassium: 3.76 mmol/L (ref 3.50–5.10)
Potassium: 4.3 mmol/L (ref 3.50–5.10)
Sodium: 146 mmol/L — ABNORMAL HIGH (ref 135–145)
Sodium: 145 mmol/L (ref 135–145)

## 2022-03-14 LAB — CBC WITH AUTO DIFFERENTIAL
HCT: 24.2 % — ABNORMAL LOW (ref 35.0–48.0)
HCT: 26.7 % — ABNORMAL LOW (ref 35.0–48.0)
Hemoglobin: 7.9 g/dL — ABNORMAL LOW (ref 11.7–16.5)
Hemoglobin: 8.4 g/dL — ABNORMAL LOW (ref 11.7–16.5)
MCH: 30.2 pg (ref 28.3–33.3)
MCH: 29.7 pg (ref 28.3–33.3)
MCHC: 32.6 g/dL (ref 32.5–36.0)
MCHC: 31.7 g/dL — ABNORMAL LOW (ref 32.5–36.0)
MCV: 92.8 fL (ref 81.0–100.0)
MCV: 93.6 fL (ref 81.0–100.0)
MPV: 9 fL (ref 6.9–10.0)
MPV: 10 fL (ref 6.9–10.0)
Platelet Count: 79 10*3/ÂµL — ABNORMAL LOW (ref 150–405)
Platelet Count: 84 10*3/ÂµL — ABNORMAL LOW (ref 150–405)
RBC: 2.61 10*6/ÂµL — ABNORMAL LOW (ref 3.80–5.60)
RBC: 2.85 10*6/ÂµL — ABNORMAL LOW (ref 3.80–5.60)
RDW: 19.8 % — ABNORMAL HIGH (ref 11.7–16.1)
RDW: 20.6 % — ABNORMAL HIGH (ref 11.7–16.1)
WBC: 1.8 10*3/ÂµL — ABNORMAL LOW (ref 4.5–11.0)
WBC: 1.7 10*3/ÂµL — ABNORMAL LOW (ref 4.5–11.0)

## 2022-03-14 LAB — BLOOD CULTURE
Blood Culture Result: NO GROWTH
Blood Culture Result: NO GROWTH

## 2022-03-14 LAB — POTASSIUM
Potassium: 3.77 mmol/L (ref 3.50–5.10)
Potassium: 3.7 mmol/L (ref 3.50–5.10)
Potassium: 4.04 mmol/L (ref 3.50–5.10)
Potassium: 3.81 mmol/L (ref 3.50–5.10)

## 2022-03-14 LAB — PHOSPHORUS: Phosphorus: 2.5 mg/dL (ref 2.5–4.7)

## 2022-03-14 LAB — ANTIBODY SCREEN (HCLL): Antibody Screen: NEGATIVE

## 2022-03-14 LAB — MAGNESIUM: Magnesium: 1.3 mg/dL (ref 1.3–2.5)

## 2022-03-14 MED ORDER — oxacillin in dextrose 5 % (D5W) 50 mL IVPB
2 | INTRAVENOUS | Status: DC
Start: 2022-03-14 — End: 2022-03-15
  Administered 2022-03-14 – 2022-03-15 (×12): via INTRAVENOUS

## 2022-03-14 MED ORDER — sodium chloride 0.9 % (NS) for medication or blood product administration 250 mL
0.9 | INTRAVENOUS | Status: DC | PRN
Start: 2022-03-14 — End: 2022-03-18

## 2022-03-14 MED ORDER — gadobutroL (GADAVIST) injection 11 mL
10 | Freq: Once | INTRAVENOUS | Status: AC
Start: 2022-03-14 — End: 2022-03-13
  Administered 2022-03-14: 01:00:00 10 mL via INTRAVENOUS

## 2022-03-14 MED ORDER — potassium chloride IVPB 20 mEq/100 mL
20 | Freq: Once | INTRAVENOUS | Status: AC
Start: 2022-03-14 — End: 2022-03-13
  Administered 2022-03-14: 07:00:00 20100 meq via INTRAVENOUS
  Administered 2022-03-14: 05:00:00 20 meq via INTRAVENOUS

## 2022-03-14 MED ORDER — acetaminophen (TYLENOL) suppository 650 mg
650 | Freq: Four times a day (QID) | RECTAL | Status: DC | PRN
Start: 2022-03-14 — End: 2022-03-22

## 2022-03-14 MED ORDER — potassium chloride IVPB 20 mEq/100 mL
20 | Freq: Once | INTRAVENOUS | Status: AC
Start: 2022-03-14 — End: 2022-03-14
  Administered 2022-03-14: 19:00:00 20 meq via INTRAVENOUS
  Administered 2022-03-14: 21:00:00 20100 meq via INTRAVENOUS

## 2022-03-14 MED ORDER — potassium chloride IVPB 20 mEq/100 mL
20 | Freq: Once | INTRAVENOUS | Status: AC
Start: 2022-03-14 — End: 2022-03-14
  Administered 2022-03-14: 10:00:00 20 meq via INTRAVENOUS
  Administered 2022-03-14: 12:00:00 20100 meq via INTRAVENOUS

## 2022-03-14 MED ORDER — magnesium sulfate in water IVPB premix 2 g
2 | Freq: Once | INTRAVENOUS | Status: AC
Start: 2022-03-14 — End: 2022-03-14
  Administered 2022-03-14: 19:00:00 2 g via INTRAVENOUS
  Administered 2022-03-14: 21:00:00 2504 g via INTRAVENOUS

## 2022-03-14 MED ORDER — oxacillin (PROSTAPHLIN) 2 g in 50 mL 0.9 % NaCl SNAP IVPB
INTRAMUSCULAR | Status: DC
Start: 2022-03-14 — End: 2022-03-14

## 2022-03-14 NOTE — Progress Notes (Signed)
PCU Progress Note  [facility]   Name: Brandy [name]  DOB: 21-Jul-1955 66 y.o.  MRN: [id number]  CSN: [id number]  XBM:WUXLK Mountz, DO    Subjective:     Brandy [name] is a 66 y.o. 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    Interim events: Amiodarone drip finished. Has not had any arrhythmias since. Tylenol changed from scheduled to prn    REVIEW OF SYSTEMS:  Denies pain, shortness of breath, and palpitations    Objective:     Vitals Current 24 Hour Min / Max      Temp    36.1 ?C (97 ?F)    Temp  Min: 36.1 ?C (97 ?F)  Max: 37.9 ?C (100.2 ?F)      BP     110/81     BP  Min: 110/81  Max: 133/86      HR    97    Pulse  Min: 93  Max: 108      RR    18    Resp  Min: 15  Max: 29      Sats      SpO2: 100 % on  L/min       SpO2  Min: 97 %  Max: 100 %      Weight    111.1 kg (245 lb)  Body mass index is 43.4 kg/m?Marland Kitchen    Admit: 103.4 kg (228 lb)   Intake/Ouput:    Intake/Output Summary (Last 24 hours) at 03/14/2022 0549  Last data filed at 03/14/2022 0513  Gross per 24 hour   Intake 3949.22 ml   Output 2600 ml   Net 1349.22 ml      LDAs:   Triple lumen right IJ CVC  Foley  pIV x2    Scheduled Medications:  . acetaminophen (TYLENOL) suppository  650 mg Rectal Q6H   . ampicillin-sulbactam (UNASYN) IV  3 g Intravenous Q12H   . doxycycline (VIBRAMYCIN) IVPB  100 mg Intravenous Q12H   . nystatin   Topical 2 times per day   . pantoprazole (PROTONIX) 40 mg in dextrose 5 % (D5W) 100 mL IVPB  40 mg Intravenous Daily   . polyvinyl alcohol-povidone(PF)  1 drop Both Eyes 4x Daily while awake     Infusions:     PRN Medications: albuterol, calcium carbonate, HYDROmorphone,  lidocaine, lidocaine, LORazepam, magnesium hydroxide, naloxone (NARCAN) injection 0.04 mg, naloxone, ondansetron **OR** ondansetron, polyethylene glycol, senna-docusate, sodium chloride 0.9 %, sodium chloride 0.9 %, sodium chloride 0.9 %, sulfur hexafluoride    Code Status: DNR    PHYSICAL EXAMINATION:   Gen: NAD, pleasant, speaking in full sentences and asking questions, difficult to understand with BiPAP on  HENT: EOM intact, Conjunctiva normal. BIPAP on.  CV: RRR, -m/r/g  Pulm: On BIPAP, CTAB  Abd: NT/ND, normal BS  Ext: pale, cap refill <2 sec. Trace edema in lower limbs. Knees are warm to touch bilaterally, no erythema.  Skin: Scattered ecchymoses and scabs. No open wounds (with exception of wound at gluteal cleft which appears unchanged), and no areas of fluctuance.    Recent Labs:   Results from last 7 days   Lab Units 03/14/22  0448 03/13/22  0440 03/12/22  1610 03/11/22  1512 03/11/22  0704 03/10/22  1812 03/10/22  0532 03/09/22  9604 03/08/22  1512 03/08/22  0641 03/08/22  0000 03/07/22  1707 03/07/22  1103   WBC 10*3/?L 1.8*  1.8* 1.8*  1.8* 1.8*  1.8*  --  1.7*  1.7*  --  2.9*  2.9* 4.4*  4.4* 6.3  6.3 8.4  8.4 10.5  10.5  --  4.9  4.9   HGB g/dL 7.9* 8.5* 9.0* 8.9* 7.6* 8.5* 7.3* 7.2* 6.9* 7.2* 7.3* 7.5* 9.1*   MCV fL 92.8 91.8 92.6  --  91.8  --  92.3 91.2 92.0 93.1 92.8  --  92.2   NEUTROS PCT % 76* 78* 76*  --  87*  --  95* 96* 95* 93* 91*  --  91*   PLATELETS 10*3/?L 79* 65* 57*  --  33*  --  31* 35* 42* 40* 47*  --  68*     Results from last 7 days   Lab Units 03/14/22  0448 03/13/22  2355 03/13/22  1901 03/13/22  1521 03/13/22  1255 03/13/22  0440 03/12/22  2328 03/12/22  1811 03/12/22  1300 03/12/22  0716 03/11/22  1512 03/11/22  0704 03/10/22  1644 03/10/22  1118 03/10/22  0532 03/09/22  5409 03/08/22  1512 03/08/22  0641 03/08/22  0000 03/07/22  1707 03/07/22  1103   SODIUM mmol/L 147*  --   --  149*  --  151*  --   --   --  148*  --  148* 146* 146* 144 141 138 139 133* 138 137    POTASSIUM mmol/L 4.04  4.04 3.70 3.77 4.04 3.46* 3.33*  3.33* 3.60 3.71 3.51 3.59 3.64 3.29* 3.52 3.62 3.60 4.04 4.01 4.11 3.95 3.56 3.74   CHLORIDE mmol/L 112.6*  --   --  114.0*  --  114.9*  --   --   --  114.1*  --  113.6* 111.8* 112.7* 111.3* 109.1 105.5 106.2 100.9* 103.3 100.8*   CO2 mmol/L 24  --   --  22  --  26  --   --   --  21  --  21 20 20 20  16* 15* 15* 16* 13* 17   GLUCOSE mg/dL 811*  --   --  914*  --  157*  --   --   --  107*  --  131* 132* 108* 116* 116* 121* 163* 192* 150* 142*   BUN mg/dL 47*  --   --  52*  --  52*  --   --   --  57*  --  63* 67* 67* 69* 78* 78* 71* 70* 63* 65*   CREATININESERUM mg/dL 7.82*  --   --  9.56*  --  1.79*  --   --   --  1.85*  --  2.18* 2.43* 2.49* 2.54* 3.50* 3.61* 3.96* 3.85* 4.09* 4.21*   CALCIUM mg/dL 7.9*  --   --  8.2*  --  8.1*  --   --   --  8.2*  --  7.8* 7.6* 7.6* 7.2* 6.9* 6.9* 7.1* 6.9* 7.0* 8.1*   MAGNESIUM mg/dL  --   --   --   --   --  1.4  --   --   --   --   --   --   --  1.7  --   --   --   --   --   --  1.6   PHOSPHORUS mg/dL  --   --   --  2.1*  --  3.2  --   --   --   --   --   --   --   --  3.6  --   --   --   --   --  4.1     Results from last 7 days   Lab Units 03/14/22  0448 03/13/22  1521 03/13/22  0440 03/12/22  0716 03/11/22  0704 03/10/22  0532 03/09/22  0614 03/08/22  0641 03/07/22  1103   AST IU/L 53*  --  64* 77* 91* 123* 180* 238* 237*   ALT IU/L 23  --  23 27 32 44* 55* 64* 74*   ALK PHOS IU/L 105  --  95 72 67 69 73 82 61   PROTEIN TOTAL g/dL 5.6*  --  5.6* 5.6* 5.2* 5.1* 5.3* 5.5* 7.0   ALBUMIN g/dL 2.3* 2.5* 2.4* 2.1* 2.1* 2.3* 2.3* 2.6* 3.4*   A/G RATIO  0.7*  --  0.8* 0.6* 0.7* 0.8* 0.8* 0.9* 0.9*         Results from last 7 days   Lab Units 03/10/22  1644 03/10/22  1118   TROPONIN T ng/mL 0.37* 0.37*     Results from last 7 days   Lab Units 03/07/22  1103   INR  1.3*   PROTIME Seconds 14.4*         Recent Imaging:  MRI brain with and without contrast    Result Date: 03/13/2022  EXAMINATION: MRI BRAIN W WO CONTRAST HISTORY:  Evaluate known subarachnoid hemorrhage, look for septic emboli or CVA, look for brain mets (has breast cancer) COMPARISON: Head CTs 03/07/2022 TECHNIQUE: Multiplanar pulse sequences were performed, including FLAIR sequences, as well as pre-and post-gadolinium imaging. FINDINGS: WHITE MATTER: No evidence of demyelinating disease. DIFFUSION RESTRICTION:  Minimal cortical diffusion restriction in the left frontal lobe near the vertex.  Single punctate focus of diffusion restriction near the vertex at the level of the right precentral gyrus. ENHANCING LESIONS: None seen. HEMORRHAGE: Minimal subarachnoid hemorrhage near the left frontal cortical edema.  No evidence of subarachnoid blood in the right parietal or parieto-occipital region. VASCULAR LESIONS: None identified. CEREBRUM: Minimal cortical edema in the left frontal lobe near the vertex.No evidence of intra-axial mass, subcortical edema, or mass effect. CEREBELLUM: No inappropriate atrophy. No evidence of intra-axial mass, subcortical edema, or mass effect. No evidence of Chiari malformation. VENTRICLES/ SULCAL SPACES: Normal for age. No evidence of obstructive hydrocephalus. BRAINSTEM: Normal for age. PITUITARY: Not enlarged. CALVARIUM: No evidence of mass or displaced fracture. ORBITS:  No evidence of orbital mass or enhancing lesion. VISUALIZED SINUSES: Normally aerated. OTHER: None.     IMPRESSION: 1. Minimal subarachnoid hemorrhage near the left frontal cortical edema. 2. No MR evidence of intracranial metastasis. NOTE: This dictation was produced using voice recognition software. Although effort has been made to minimize transcription errors, homonyms and other transcription errors may be present and may not truly reflect my intent. Please excuse any unintentional verbiage, or grammatical errors. Dictated by: Junious Dresser Electronically Signed by: Junious Dresser on 03/13/2022 6:48 PM Report ID#: [id number]     X-ray Chest 1 View    Result Date:  03/13/2022  EXAMINATION: XR CHEST 1 VIEW HISTORY: Dubhoff tube placement. COMPARISON: 03/10/2022 FINDINGS: LINES/TUBES:  The following lines are seen-feeding tube is seen with the distal tip at the level of the distal esophagus.   Central  line seen placed through a right jugular approach with the distal tip at the right cavoatrial junction. LUNGS: Hypoinflation with bibasilar linear atelectasis.  Mild increased pulmonary vascularity. CARDIAC: Normal size cardiac silhouette. Sternal wires and evidence of prior mediastinal surgery. Aortic valve prosthesis is seen in place. MEDIASTINUM: No superior mediastinal widening. PLEURA: No evidence of pleural effusion or pneumothorax. OTHER: Negative.     IMPRESSION: 1. feeding tube is seen with the distal tip at the level of the distal esophagus. NOTE: This dictation was produced using voice recognition software. Although effort has been made to minimize transcription errors, homonyms and other transcription errors may be present and may not truly reflect my intent. Please excuse any unintentional verbiage, or grammatical errors. Dictated by: Junious Dresser Electronically Signed by: Junious Dresser on 03/13/2022 9:42 AM Report ID#: [id number]     X-ray Chest 1 View    Result Date: 03/13/2022  EXAMINATION: XR CHEST 1 VIEW HISTORY: NG tube placement COMPARISON: 03/12/2022 at 19:09 FINDINGS: LINES/TUBES: RIGHT internal jugular approach central line terminates in the superior vena cava.  Feeding tube is in the distal aspect of the stomach. HEART SIZE: Moderate cardiomegaly.  Prosthetic cardiac valve MEDIASTINUM: Median sternotomy LUNGS/PLEURA: Patchy airspace disease in both mid and lower lung zones, may be slightly improved within the RIGHT lung base.  Interstitial changes in both lungs persist. OTHER: None.     IMPRESSION: 1. See above. Dictation was completed either in part or in whole via voice recognition software. Although effort has been made to minimize translation errors, homonyms  and other transcription errors may be present and may not truly reflect the intent to the report. If  unintentional verbiage is suspected or numerical errors is suspected please contact Radiology Dictated by: Hoy Finlay Electronically Signed by: Hoy Finlay on 03/13/2022 8:37 AM Report ID#: [id number]       Assessment & Plan:   ASSESSMENT:  Patient Active Problem List    Diagnosis   . Subarachnoid hemorrhage (HCC)   . Acute encephalopathy   . Septic shock (HCC)   . Acute pyelonephritis   . Acute renal failure, unspecified acute renal failure type (HCC)   . SAH (subarachnoid hemorrhage) (HCC)   . Encephalopathy acute   . History of amputation of right great toe (HCC)   . Menopausal syndrome on hormone replacement therapy   . Long term (current) use of aromatase inhibitors   . HER2-positive carcinoma of breast (HCC)   . Thickened endometrium   . Malignant neoplasm of hepatic flexure (HCC)   . Axillary adenopathy   . 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 Anaphylaxis   . Back pain, thoracic   . Hyperparathyroidism (HCC)   . Colon cancer (HCC)   . Aortic valve disorder   . Heparin induced thrombocytopenia (HCC)   . Non-ischemic cardiomyopathy (HCC)   . Demand ischemia (HCC)   . Obesity   . Iron deficiency anemia   . Hilar lymphadenopathy- bilataeral    . Liver lesion- small hypodense lesion around falciform lig     Brandy [name] is a 66 y.o. 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     CARDIOVASCULAR:   #Severe  Aortic Stenosis s/p Bioprosthetic Aortic Valve - 2017  POA. Complicates care if  bacteremic. STAT ECHO shows normally functioning prosthetic valve with no signs of vegetation.  - Discussed with cardiology and appreciate recommendations to Repeat TTE on 10/3 and depending on quality of images, could consider TEE 10/5.     #Supraventricular Tacycardia  Developed on 9/29, lasted a few minutes and terminated without intervention. Ddx includes MI, PE, a fib, other arrhythmia, sepsis, anemia, electrolyte abnormality. History of afib. New ST depressions in V3 and V6 on EKG. Troponin of 0.37 > 0.37. Dr. Marcene Brawn consulted, greatly appreciate recommendations.   - Amiodarone bolus and gtt. Per cardiology, continue infusion for 24 hrs then stop (10/2). If arrhythmia occurs, will require long acting antiarrhythmic.?    #HFrEF   POA. Does not appear in exacerbation, appears to have improved following valve replacement in 2017. STAT ECHO 9/26 shows EF 40-45%.  - Resume carvedilol (can be crushed)     PULM:   #Acute Hypoxic Respiratory Failure   POA. Likely due to pneumonia and fluid status described in detail elsewhere. No baseline O2 use. No known lung disease. S/p NG to decompress stomach after bagging by EMS.  - RTDP  - O2 to keep sats >90%    ID:   #MSSA bacteremia of unclear source  POA. Ongoing fevers as of 10/01. Blood cultures again positive 9/28. ID consult 9/29: Discussed the case with Dr. Bertell Maria at [facility] in [place] who recommended continuation of antibiotics to cover MSSA, Klebsiella, and atypicals, with removal of port and further imaging only if blood cultures are positive again, with the exception of TEE, which he strongly recommends given bovine aortic valve. Blood cultures turned positive and Dr. Broadus Brandy [name] graciously removed the port on 9/30 for source control. Antibiotic course:   - Ceftriaxone, 9/26, 9/28-29  - Cefepime 9/26-9/28  - Vancomycin 9/26-29  - Cefazolin 9/29  - Unasyn 9/29 - 10/02  - Doxycycline 9/29 - 10/2  - Oxacillin 10/2-  Antibiotics for 14 days total. 9/26-10/10  Daily blood  cultures  Consider removal of CVC and placement of PICC line at 0200  Discussed indication forTEE with Dr. Harlen Labs. Appreciate his recommendations for TTE 10/3 and possible TEE on 10/5 to assess for endocarditis.  Continue scheduled tylenol    #UTI (resolved)  POA. Asymptomatic. Culture grew Klebsiella pan-sensitive. For source control, replaced foley 9/28.    #Community acquired pneumonia   POA. R lower lobe. Repeat CXR 9/29 appears worse, most consistent with pulmonary edema given clinical presentation of hypervolemia.  - ID consult: appreciate recommendations to cover atypicals in case of pneumonia.    #Intrigenous candidal infection  - nystatin powder and Skinfold dry sheets    ENDO:   #Hyperparathyroidism s/p parathyroidectomy 2019  Complicates care. Monitor electrolytes.     RENAL:   #Acute Renal Failure   POA. Cr normal at baseline, 4.21 on admission. Renal US showed no solid mass or hydronephrosis, though CT showed calculus in bladder that may have recently passed. Creatinine improving s/p fluid resuscitation.   - Foley, strict I/O   - Pharmacy consulted to review medications     GI/FEN:   #Acute Hepatitis   POA. Likely shock liver. RUQ US shows diffuse increased echogenicity of the liver which could indicate fatty infiltration, hepatitis, or cirrhosis. LFTs are trending downward.    #Hypervolemia  #Free Water Deficit  Fluid overloaded with pulmonary edema on CXR 9/29.  Na trending up, up to 151 10/2. Maintenance fluids D5W  9/29. NG tube placed 9/30 and given free water enterally. 3.9 L free water deficit 10/2. Improving.   - 250 mL free water q4hrs,  - Continue to trend Na and titrate free water accordingly   - Titrating furosemide  - SLP recommends trial of ice chips and sips of water  - discontinue furosemide, consider restarting 10/4 if still fluid overloaded    #Refeeding syndrome.  Due to prolonged NPO status. Mg and phosphorus wnl. Repleting potassium.  - Increase feeding rate slowly.    NEURO:    #Acute Metabolic Encephalopathy   POA. Likely due to severe sepsis, GCS initially 3. Treating the underlying causes. Intubation discussed at length with the husband, decided DNR/DNI. By 9/27 was moving spontaneously, whispering single words, and able to answer questions. Improved 10/1 to speaking in full sentences and initiating conversation.  - Dilaudid PRN for pain   - MRI w/ contrast to evaluate for causes of encephalopathy or SAH.  ?  #Trace Non-Aneurysmal Subarachnoid Hemorrhage  POA. Seen on CT head on admission. Discussed with Neurosurgery by ED physician, gave no recommendations for management. MRI 10/2 shows stable small SAH, no metastases.  - Seizure precautions   - Avoid anticoagulation for 4 weeks       SKIN/MSK: ?  #Pressure Injuries, Ecchymosis  POA. Multiple and scattered across back.   - Wound care consultation  - Frequent position changes   - thorough skin check for abscess or other infection source    HEME/ONC:   #Pancytopenia   #Hx HIT  #Coagulopathy of Sepsis  Developed over hospital course. At baseline, has iron deficiency anemia and chemotherapy-induced anemia. Hgb intermittently dropped, requiring blood transfusions, now s/p 3upRBCs. On 9/30, ANC 1500.   - Neutropenic precautions  - Continue to trend H&H  - TT Hgb <8  - Avoid heparin   - Monitor for bleeding   ?  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 Following with Dr. Nadeen Landau at [facility], currently on Docetaxel/Trastuzumab. Metastatic to lymph node. Last treatment 02/09/2022. Required blood transfusions due to chemotherapy.   ?  #Colon cancer s/p colectomy 2018  Complicates care, bowel regimen.     Resolved Hospital Problems  #Septic Shock: Severe and refractory. Appropriately fluid resuscitated. S/p stress dose steroids. Levophed 9/26-9/27.  #Respiratory Alkalosis: Tachypneic due to sepsis/hyperthermia.  #Rhabdomyolysis: Mild. McMahon Score: high risk. Fluid resuscitated. CK peaked 9/27 at 5,722 and then decreased.  #Anion Gap  Metabolic Acidosis: POA. Likely from shock and renal failure. Lactate normal.   #Hyperthermia: Temperature initially nearly 105F, likely contributed to rhabdomyolysis. She was cooled using fans, IVF, APAP.      Feeding: Per Nutrition, osmolite 1.2 @63  ml/hr. Tolerating sips of water. Free water flushes 300 mL q2 hrs  Analgesia: Dilaudid PRN  Sedation: None  Thromboprophylaxis: None - subarachnoid hemorrhage   Head of bed: 30 degrees  Ulcer prophylaxis: PPI  Glucose: 5.5% (07/27/2020); 6.0% (01/26/2016); 5.2% (01/21/2016)   Bowel regimen: PRN  I - Lines/Drains/Airway: CVC R IJ 9/26, R radial arterial line 9/26-9/28, NG tube 9/26-9/27, 9/30 Foley 9/26  Deescalate antibiotics - ongoing and complex course, see above    CODE: DNR/DNI per multiple conversations with patient's husband    Verbal MDPOA: Husband, Brandy [name]  DISPO: PCU, guarded    Social: Lives with husband, Brandy [name], who is MDPOA      Maliq Pilley   03/14/2022  5:49 AM

## 2022-03-14 NOTE — Consults (Signed)
INPATIENT MEDICAL NUTRITION THERAPY     Brandy [name] is a 66 y.o. female, DOB February 14, 1956    Reason for assessment: TF  Diagnosis: septic shock  Pertinent medical history: CHF, colon cancer and metastatic breast cancer      NUTRITION ASSESSMENT    Anthropometric Measurements:   Ht: 160 cm (63)         Initial Wt: 103.4 kg (228 lb)   Updated Wt: 110.5 kg  IBW: 64 kg   %IBW: 172   BMI: 43.15  Nutrition Ht/Wt Status: morbid obesity   % wt loss: none, pt is currently fluid positive     Wt hx:   Wt Readings from Last 10 Encounters:   03/13/22 111.1 kg (245 lb)   02/23/22 107.8 kg (237 lb 11.2 oz)   02/23/22 108.1 kg (238 lb 4.8 oz)   02/09/22 107.1 kg (236 lb 1.6 oz)   02/02/22 107.8 kg (237 lb 11.2 oz)   01/19/22 110.1 kg (242 lb 11.2 oz)   12/29/21 112.1 kg (247 lb 3.2 oz)   12/08/21 113.2 kg (249 lb 9.6 oz)   12/01/21 112.9 kg (249 lb)   11/23/21 111.2 kg (245 lb 3.2 oz)         Biochemical Data, Medical Tests, Procedures:  Lab Results   Component Value Date/Time    NA 146 (H) 03/14/2022 07:23 AM    K 3.76 03/14/2022 07:23 AM    CL 111.4 (H) 03/14/2022 07:23 AM    CO2 24 03/14/2022 07:23 AM    GLU 138 (H) 03/14/2022 07:23 AM    BUN 46 (H) 03/14/2022 07:23 AM    CREATININES 1.34 (H) 03/14/2022 07:23 AM    LABCREA 77 01/28/2016 06:36 PM    MG 1.3 03/14/2022 07:23 AM    PHOS 2.5 03/14/2022 07:23 AM    AST 53 (H) 03/14/2022 04:48 AM    ALT 23 03/14/2022 04:48 AM       Lab Results   Component Value Date/Time    HGB 8.4 (L) 03/14/2022 07:23 AM    HCT 26.7 (L) 03/14/2022 07:23 AM    MCV 93.6 03/14/2022 07:23 AM       Lab Results   Component Value Date/Time    CHOL 123 02/08/2018 02:06 PM    HDL 30 (L) 02/08/2018 02:06 PM    LDLCALC 55 02/08/2018 02:06 PM    TRIG 192.0 (H) 02/08/2018 02:06 PM       Lab Results   Component Value Date/Time    POCGLU 111 (H) 02/15/2016 11:12 AM    POCGLU 118 (H) 02/10/2016 07:44 PM    POCGLU 91 02/10/2016 04:05 PM    POCGLU 162 (H) 02/10/2016 12:17 PM    POCGLU 102 (H) 02/10/2016 07:57 AM     GLYCOA1C 5.5 07/27/2020 04:32 PM       GFR Estimate   Date Value Ref Range Status   02/24/2016 >60 >=60 mL/min/1.13m*2 Final   02/21/2016 >60 >=60 mL/min/1.14m*2 Final   02/18/2016 >60 >=60 mL/min/1.11m*2 Final     Glomerular Filtration Rate Estimate   Date Value Ref Range Status   03/06/2016 >60.0 >=60.0 mL/min/1.72m*2 Final   01/23/2016 >60.0 >=60.0 mL/min/1.18m*2 Final   01/23/2016 >60.0 >=60.0 mL/min/1.55m*2 Final     Glomerular Filtration Rate Estimate (Female)   Date Value Ref Range Status   02/28/2021 79 >=60 mL/min/1.63m*2 Final   09/08/2020 83 >=60 mL/min/1.34m*2 Final   07/27/2020 87 >=60 mL/min/1.35m*2 Final   10/04/2018 >60.0 >=60.0 mL/min/1.2m*2 Final  06/25/2018 59.4 (L) >=60.0 mL/min/1.67m*2 Final   04/05/2018 >60.0 >=60.0 mL/min/1.65m*2 Final        Current scheduled meds include:   . ampicillin-sulbactam (UNASYN) IV  3 g Intravenous Q12H   . doxycycline (VIBRAMYCIN) IVPB  100 mg Intravenous Q12H   . nystatin   Topical 2 times per day   . pantoprazole (PROTONIX) 40 mg in dextrose 5 % (D5W) 100 mL IVPB  40 mg Intravenous Daily   . polyvinyl alcohol-povidone(PF)  1 drop Both Eyes 4x Daily while awake         PRN Meds include:  acetaminophen (TYLENOL) suppository, albuterol, calcium carbonate, HYDROmorphone, lidocaine, lidocaine, LORazepam, magnesium hydroxide, naloxone (NARCAN) injection 0.04 mg, naloxone, ondansetron **OR** ondansetron, polyethylene glycol, senna-docusate, sodium chloride 0.9 %, sodium chloride 0.9 %, sodium chloride 0.9 %, sodium chloride 0.9 %, sulfur hexafluoride    Food allergies: none  Other Allergies: Bee sting [allergen ext-venom-honey bee], Heparin, and Sulfa (sulfonamide antibiotics)    Food/Nutrition-Related History:  Diet Orders:   Procedures   . Diet Consistency: NPO         Date of Last Bowel movement: 10/2    Skin Concerns: edema noted on all extremities    Estimated Needs  Protein: 85 g (1.3 g/kg)  Energy: 1800 kcal (28 kcal/kg)  Fluid: 1800 mL (1 mL/kcal)    PO  Intake: NPO    Pt continues on TF. Pt with loose stools, but banatrol is not appropriate d/t 8 french tube size. TF to be advanced soon. Discussed refeeding labs with Dr Robet Leu, will have those ordered today and tomorrow as well. Mag, K, and phos WNL.         EDUCATION  ? Education topics/handouts provided: none this visit  ? Barriers to learning: n/a  ? Comprehension: n/a  ? Receptivity: n/a  ? Expected compliance: n/a    NUTRITION DIAGNOSIS  Inadequate oral intake related to septic shock, dysphagia as evidenced by inability to safely swallow, need for TF.     These characteristics are NOT indicative of malnutrition at this time. However, pt at risk for malnutrition pending trend of mild characteristics.     NUTRITION INTERVENTION  TF: osmolite 1.2 @63  mL/hr - this to provide 1800 kcal, 84 g pro, 1240 mL free water  Water flushes per MD - 300 mL q 2 hrs     MONITORING & EVALUATION  Wts, diet advancement, nutrition related labs, POC     Follow Up: 1-3 days    Pearson Forster, RD, LD  03/14/2022  10:48 AM  [facility]  Available on [place] or [phone]

## 2022-03-14 NOTE — Plan of Care (Signed)
Problem: Knowledge Deficit  Goal: Patient/family/caregiver demonstrates understanding of disease process, treatment plan, medications, and discharge instructions  Description: Complete learning assessment and assess knowledge base.  03/14/2022 1624 by Ernestina Columbia, RN  Outcome: Progressing  03/14/2022 1616 by Ernestina Columbia, RN  Outcome: Progressing  Note: Provided and reinforced pt education regarding new medications and changes, POC, and NPO status. Pt demonstrates understanding but needs reinforcement periodically. Recommendation to continue providing education at patients level of understanding and reinforce teaching as needed.      Problem: Metabolic/Fluid and Electrolytes  Goal: Electrolytes maintained within normal limits  Description: INTERVENTIONS:  1. Monitor labs and assess patient for signs and symptoms of electrolyte imbalances  2. Administer electrolyte replacement as ordered  3. Monitor response to electrolyte replacements, including repeat lab results as appropriate  4. Fluid restriction as ordered  5. Instruct patient on fluid and nutrition restrictions as appropriate  Outcome: Progressing  Flowsheets (Taken 03/14/2022 1624)  Addressed this shift: Electrolytes maintained within normal limits:   Monitor labs and assess patient for signs and symptoms of electrolyte imbalances   Administer electrolyte replacement as ordered   Monitor response to electrolyte replacements, including repeat lab results as appropriate   Instruct patient on fluid and nutrition restrictions as appropriate  Note: Potassium ordered and administered per protocol, level increased to ordered values upon redraw. Pt tube feedings also being administered per order. Pt requested ice chips- approved per swallow eval, demonstrates no difficulty swallowing one ice chip at a time. Recommendation to continue nutritional supplements and restrictions as ordered, continue monitoring lab values, and administer electrolyte  replacement as ordered.

## 2022-03-14 NOTE — Treatment Summary (Signed)
Inpatient Speech Pathology - Daily Note  Dysphagia    [name] / [id number] DOB:  [birth year] / 66 y.o. Date:  03/14/2022     Start/Stop Time: 0900-0920                                             SUBJECTIVE   Patient was agreeable to participate with skilled swallowing therapy. Comment: the pt was kind and cooperative.     Patient/family reported: that she was feeling ok today and wanted to try water; she reported that she didn't want to do too much too soon    RN staff reported: that the pt seems to be doing ok with singular ice chips; she has not yet had oral care today    Other: pt is on neutropenic precautions; has an NG tube, and an oxymizer.      Pain: Pain in rear reported.    OBJECTIVE      Skilled SLP services for dysphagia management provided. Services included: therapeutic diet upgrade trials, instruction and training in compensatory strategies and patient/caregiver education    Mental status  Appeared [facility]     Respiratory Status: open mouth breathing    O2 Status:  oxymizer and adequate O2 saturations during ST    Items presented: ice chips and thin liquid via tsp, via cup side, and via straw    Oral Phase: holding of bolus    Pharyngeal Phase: delayed swallow, reduced laryngeal elevation upon palpation of swallow, audible swallow    Signs/Symptoms Penetration/Aspiration were: intermittent/rare and observed with the following consistencies: thin water from a cup side sip    Esophageal Signs/Symptoms: None    Training for Compensatory Strategies- Strategies instructed during PO trials included: small sips/bites, avoiding chugging of liquids and effortful swallow    Compensatory Strategy Performance: patient required mod cueing for adequate return demonstration of strategies; she attempted an effortful swallow, but was not able to achieve it    Education: Instructed patient/family regarding results/recommendations, instructed RN regarding results/recommendations, compensatory swallowing  strategies, aspiration pneumonia prevention, oral hygiene and options for nutrition (including alternative nutrition)     SLP presented:  - ice chips 1-2 at a time from a tsp - pt had no overt s/s of aspiration with these  - sips of water from a tsp - no overt s/s aspiration with these either  - sips of water from the side of the cup one at a time, but large - immediate coughing and needed a minute to catch her breath with these  - sips of water one at a time from a straw - no overt s/s of aspiration with these  - sips of water from a straw 2-3 at a time - significant coughing/needing to catch her breath    ASSESSMENT     Clinical Impressions:  moderate-severe oropharyngeal dysphagia. With comparison to previous session, patient demonstrates: slightly improving swallow function/safety. Today patient was able to participate well. Patient benefits from cues, education, encouragement.     Patient's current deficits in: swallow function have the potential to cause/contribute to: increased risk of aspiration or choking, inadequate nutrition/hydration, decrease in overall health and well-being and decreased quality of life. Skilled dysphagia management was provided to: maximize patient's swallow safety, maximize patient's ability to participate with swallowing/meals, maximize patient's ability to achieve optimal nutrition/hydration and maximize comfort/quality of life for  patient though PO intake    Recommendations:   Diet recommendations:  NPO, ice chips ok for comfort/swallowing practice and only after oral care; may have tsp sips of water with supervision only    Compensatory Strategies: Excellent/frequent oral hygiene , small sips, chew carefully, Effortful swallow    Medications: Not safe to give orally     Additional diagnostic/treatment considerations: none at this time     Consult recommendations  Dietitian     PLAN   Next session will focus on: swallowing re-assessment at bedside, assessment of readiness to  upgrade diet, compensatory strategy training, education for patient/family and/or RN    Goal 1:  Patient will be able to tolerate trials of ice chips and/or small sips of water for comfort/moisture and swallowing practice.    Discharge recommendations: unknown (depending on patient progress)    Treatment Plan Frequency   Speech/Language/Cognitive therapy to include: swallow assessments and treatment as indicated, assessment of diet tolerance and readiness to upgrade diet, monitoring of swallow safety, compensatory strategy training and patient/family/RN education  5 x  per week during Inpatient stay.  Frequency will be modified as patients condition indicates.    Change in frequency on POC:No     Andrianna Delcour will remain on the Speech Pathology schedule until goals are met, or there has been a change in the Plan of Care.      Speech Language Pathologist: Elizebeth Brooking, CCC-SLP

## 2022-03-14 NOTE — Plan of Care (Signed)
Problem: Knowledge Deficit  Goal: Patient/family/caregiver demonstrates understanding of disease process, treatment plan, medications, and discharge instructions  Description: Complete learning assessment and assess knowledge base.  Outcome: Progressing   Patient educated on plan of care including enteral feeding, oxygen therapy, medications, skin care principles, and measured by verbalization of understanding. Patient is receptive to shift education. Continue to be available to answer any questions and/or concerns.

## 2022-03-15 ENCOUNTER — Inpatient Hospital Stay: Admit: 2022-03-16

## 2022-03-15 ENCOUNTER — Inpatient Hospital Stay: Admit: 2022-03-15 | Discharge: 2022-04-11

## 2022-03-15 LAB — COMPREHENSIVE METABOLIC PANEL
ALT - Alanine Aminotransferase: 24 IU/L (ref 5–33)
AST - Aspartate Aminotransferase: 44 IU/L — ABNORMAL HIGH (ref 5–32)
Albumin/Globulin Ratio: 0.8 — ABNORMAL LOW (ref 1.2–2.2)
Albumin: 2.4 g/dL — ABNORMAL LOW (ref 3.5–5.0)
Alkaline Phosphatase: 102 IU/L (ref 35–105)
Anion Gap: 12.2 mmol/L (ref 9.0–18.0)
BUN / Creatinine Ratio: 34.6 — ABNORMAL HIGH (ref 12.0–20.0)
BUN: 36 mg/dL — ABNORMAL HIGH (ref 8–20)
Bilirubin Total: 0.7 mg/dL (ref 0.10–1.70)
CO2 - Carbon Dioxide: 22 mmol/L (ref 17–27)
Calcium: 7.9 mg/dL — ABNORMAL LOW (ref 8.6–10.6)
Chloride: 106.8 mmol/L (ref 101.0–111.0)
Creatinine: 1.04 mg/dL (ref 0.60–1.30)
Glomerular Filtration Rate Estimate (Female): 59 mL/min/{1.73_m2} — ABNORMAL LOW (ref >=60)
Glucose: 150 mg/dL — ABNORMAL HIGH (ref 74–106)
Osmolality Calculation: 292.5 mOsm/kg (ref 275.0–300.0)
Potassium: 4.07 mmol/L (ref 3.50–5.10)
Protein Total: 5.6 g/dL — ABNORMAL LOW (ref 6.4–8.2)
Sodium: 141 mmol/L (ref 135–145)

## 2022-03-15 LAB — CELLAVISION DIFFERENTIAL
Basophils %: 0 % (ref 0–2)
Basophils, Absolute: 0 10*3/ÂµL (ref 0.0–0.1)
Eosinophils %: 3 % (ref 0–5)
Eosinophils, Absolute: 0.1 10*3/ÂµL (ref 0.0–0.4)
Lymphocytes %: 32 % (ref 20–40)
Lymphocytes, Absolute: 0.6 10*3/ÂµL — ABNORMAL LOW (ref 1.0–4.0)
Metamyelocytes %: 1 %
Metamyelocytes, Absolute: 0 10*3/ÂµL
Monocytes %: 2 % (ref 2–12)
Monocytes, Absolute: 0 10*3/ÂµL — ABNORMAL LOW (ref 0.2–1.0)
Neutrophils %: 61 % (ref 50–74)
Neutrophils, Absolute: 1.1 10*3/ÂµL — ABNORMAL LOW (ref 2.0–7.4)
Platelet Estimate: DECREASED — AB
Total Cells Counted: 100
WBC: 1.8 10*3/ÂµL — ABNORMAL LOW (ref 4.5–11.0)

## 2022-03-15 LAB — CBC WITH AUTO DIFFERENTIAL
HCT: 23.7 % — ABNORMAL LOW (ref 35.0–48.0)
Hemoglobin: 7.9 g/dL — ABNORMAL LOW (ref 11.7–16.5)
MCH: 30.9 pg (ref 28.3–33.3)
MCHC: 33.3 g/dL (ref 32.5–36.0)
MCV: 92.9 fL (ref 81.0–100.0)
MPV: 9.3 fL (ref 6.9–10.0)
Platelet Count: 84 10*3/ÂµL — ABNORMAL LOW (ref 150–405)
RBC: 2.56 10*6/ÂµL — ABNORMAL LOW (ref 3.80–5.60)
RDW: 19.3 % — ABNORMAL HIGH (ref 11.7–16.1)
WBC: 1.8 10*3/ÂµL — ABNORMAL LOW (ref 4.5–11.0)

## 2022-03-15 LAB — C-REACTIVE PROTEIN (SLM): C-Reactive Protein: 120.8 mg/L — ABNORMAL HIGH (ref <5.0)

## 2022-03-15 LAB — PHOSPHORUS: Phosphorus: 2.7 mg/dL (ref 2.5–4.7)

## 2022-03-15 LAB — HEMOGLOBIN AND HEMATOCRIT, BLOOD
HCT: 27 % — ABNORMAL LOW (ref 35.0–48.0)
Hemoglobin: 9 g/dL — ABNORMAL LOW (ref 11.7–16.5)

## 2022-03-15 LAB — MAGNESIUM: Magnesium: 1.3 mg/dL (ref 1.3–2.5)

## 2022-03-15 MED ORDER — pantoprazole (PROTONIX) 40 mg in 100 mL SNAP IVPB
Freq: Every day | INTRAMUSCULAR | Status: DC
Start: 2022-03-15 — End: 2022-03-19
  Administered 2022-03-15 – 2022-03-19 (×10): via INTRAVENOUS

## 2022-03-15 MED ORDER — alteplase (tPA) (CATHFLO ACTIVASE) injection 2 mg
2 | Freq: Three times a day (TID) | Status: DC | PRN
Start: 2022-03-15 — End: 2022-03-19
  Administered 2022-03-15: 23:00:00 2 mg

## 2022-03-15 MED ORDER — sodium chloride 0.9 % (NS) for medication or blood product administration 250 mL
0.9 | INTRAVENOUS | Status: DC | PRN
Start: 2022-03-15 — End: 2022-03-18

## 2022-03-15 MED ORDER — alteplase (tPA) (CATHFLO ACTIVASE) injection 2 mg
2 | Freq: Once | Status: AC
Start: 2022-03-15 — End: 2022-03-15
  Administered 2022-03-15: 23:00:00 2 mg

## 2022-03-15 MED ORDER — furosemide (LASIX) injection 20 mg
10 | Freq: Once | INTRAMUSCULAR | Status: AC
Start: 2022-03-15 — End: 2022-03-15
  Administered 2022-03-15: 18:00:00 10 mg via INTRAVENOUS

## 2022-03-15 MED ORDER — oxacillin 2 g in dextrose 5 % (D5W) 70 mL IVPB
2 | INTRAMUSCULAR | Status: DC
Start: 2022-03-15 — End: 2022-03-19
  Administered 2022-03-15 – 2022-03-19 (×44): via INTRAVENOUS

## 2022-03-15 MED ORDER — pantoprazole (PROTONIX) delayed release (DR) oral suspension 40 mg
40 | Freq: Every morning | ORAL | Status: DC
Start: 2022-03-15 — End: 2022-03-15

## 2022-03-15 MED ORDER — carvediloL (COREG) tablet 6.25 mg
6.25 | Freq: Two times a day (BID) | ORAL | Status: DC
Start: 2022-03-15 — End: 2022-03-19
  Administered 2022-03-16 – 2022-03-19 (×8): 6.25 mg via ORAL

## 2022-03-15 NOTE — Progress Notes (Signed)
PCU Progress Note  [facility]   Name: Brandy [name]  DOB: 08-Jan-1956 66 y.o.  MRN: [id number]  CSN: [id number]  NFA:OZHYQ Mountz, DO    Subjective:     Brandy [name] is a 66 y.o. 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    Interim events: Amiodarone drip finished. Has not had any arrhythmias since. Tylenol changed from scheduled to prn. Tolerating ice chips.      REVIEW OF SYSTEMS:  Endorses pain of right hip. Denies shortness of breath, palpitations, chest pain, and abdominal pain.    Objective:     Vitals Current 24 Hour Min / Max      Temp    36.4 ?C (97.6 ?F)    Temp  Min: 35.8 ?C (96.5 ?F)  Max: 36.4 ?C (97.6 ?F)      BP     (!) 110/58     BP  Min: 96/56  Max: 128/75      HR    95    Pulse  Min: 81  Max: 108      RR    16    Resp  Min: 16  Max: 22      Sats      SpO2: 100 % on  L/min       SpO2  Min: 94 %  Max: 100 %      Weight    113 kg (249 lb 3.2 oz)  Body mass index is 44.14 kg/m?Marland Kitchen    Admit: 103.4 kg (228 lb)   Intake/Ouput:    Intake/Output Summary (Last 24 hours) at 03/15/2022 0557  Last data filed at 03/15/2022 0451  Gross per 24 hour   Intake 3595 ml   Output 1700 ml   Net 1895 ml      LDAs:   Triple lumen right IJ CVC  Foley  pIV x2    Scheduled Medications:  . nystatin   Topical 2 times per day   . oxacillin in dextrose 5 % (D5W) 50 mL IVPB   Intravenous Q4H   . pantoprazole (PROTONIX) 40 mg in dextrose 5 % (D5W) 100 mL IVPB  40 mg Intravenous Daily   . polyvinyl alcohol-povidone(PF)  1 drop Both Eyes 4x Daily while awake     Infusions:     PRN Medications: acetaminophen (TYLENOL) suppository, albuterol, calcium carbonate, HYDROmorphone,  lidocaine, lidocaine, LORazepam, magnesium hydroxide, naloxone (NARCAN) injection 0.04 mg, naloxone, ondansetron **OR** ondansetron, polyethylene glycol, senna-docusate, sodium chloride 0.9 %, sodium chloride 0.9 %, sodium chloride 0.9 %, sodium chloride 0.9 %, sulfur hexafluoride    Code Status: DNR    PHYSICAL EXAMINATION:   Gen: NAD, pleasant, conversive  HENT: EOM intact, Conjunctiva normal.   CV: RRR, -m/r/g  Pulm: CTAB  Abd: NT/ND, normal BS  Ext: pale, cap refill <2 sec. Trace edema in lower limbs and hands. Lesions consistent with Janeway lesions and splinter hemorrhages noted on hands bilaterally. Decent ROM of hips bilaterally without increased pain.  Skin: Scattered ecchymoses and scabs. No open wound, and no areas of fluctuance.    Recent Labs:   Results from last 7 days   Lab Units 03/14/22  0723 03/14/22  0448 03/13/22  0440 03/12/22  0715 03/11/22  1512 03/11/22  0704 03/10/22  1812 03/10/22  0532 03/09/22  0614 03/08/22  1512 03/08/22  0641   WBC 10*3/?L 1.7*  1.7* 1.8*  1.8* 1.8*  1.8* 1.8*  1.8*  --  1.7*  1.7*  --  2.9*  2.9* 4.4*  4.4* 6.3  6.3 8.4  8.4   HGB g/dL 8.4* 7.9* 8.5* 9.0* 8.9* 7.6* 8.5* 7.3* 7.2* 6.9* 7.2*   MCV fL 93.6 92.8 91.8 92.6  --  91.8  --  92.3 91.2 92.0 93.1   NEUTROS PCT % 75* 76* 78* 76*  --  87*  --  95* 96* 95* 93*   PLATELETS 10*3/?L 84* 79* 65* 57*  --  33*  --  31* 35* 42* 40*     Results from last 7 days   Lab Units 03/14/22  1512 03/14/22  1204 03/14/22  0723 03/14/22  0448 03/13/22  2355 03/13/22  1901 03/13/22  1521 03/13/22  1255 03/13/22  0440 03/12/22  2328 03/12/22  1811 03/12/22  1300 03/12/22  0716 03/11/22  1512 03/11/22  0704 03/10/22  1644 03/10/22  1118 03/10/22  0532 03/09/22  0614 03/08/22  1512 03/08/22  0641   SODIUM mmol/L  --  145 146* 147*  --   --  149*  --  151*  --   --   --  148*  --  148* 146* 146* 144 141 138 139   POTASSIUM mmol/L 3.81 4.30 3.76 4.04  4.04 3.70 3.77 4.04 3.46* 3.33*  3.33* 3.60 3.71 3.51 3.59 3.64 3.29* 3.52  3.62 3.60 4.04 4.01 4.11   CHLORIDE mmol/L  --  111.0 111.4* 112.6*  --   --  114.0*  --  114.9*  --   --   --  114.1*  --  113.6* 111.8* 112.7* 111.3* 109.1 105.5 106.2   CO2 mmol/L  --  20 24 24   --   --  22  --  26  --   --   --  21  --  21 20 20 20  16* 15* 15*   GLUCOSE mg/dL  --  161* 096* 045*  --   --  146*  --  157*  --   --   --  107*  --  131* 132* 108* 116* 116* 121* 163*   BUN mg/dL  --  44* 46* 47*  --   --  52*  --  52*  --   --   --  57*  --  63* 67* 67* 69* 78* 78* 71*   CREATININESERUM mg/dL  --  4.09 8.11* 9.14*  --   --  1.54*  --  1.79*  --   --   --  1.85*  --  2.18* 2.43* 2.49* 2.54* 3.50* 3.61* 3.96*   CALCIUM mg/dL  --  7.9* 8.0* 7.9*  --   --  8.2*  --  8.1*  --   --   --  8.2*  --  7.8* 7.6* 7.6* 7.2* 6.9* 6.9* 7.1*   MAGNESIUM mg/dL  --   --  1.3  --   --   --   --   --  1.4  --   --   --   --   --   --   --  1.7  --   --   --   --    PHOSPHORUS mg/dL  --   --  2.5  --   --   --  2.1*  --  3.2  --   --   --   --   --   --   --   --  3.6  --   --   --      Results from last 7 days   Lab Units 03/14/22  0448 03/13/22  1521 03/13/22  0440 03/12/22  0716 03/11/22  0704 03/10/22  0532 03/09/22  0614 03/08/22  0641   AST IU/L 53*  --  64* 77* 91* 123* 180* 238*   ALT IU/L 23  --  23 27 32 44* 55* 64*   ALK PHOS IU/L 105  --  95 72 67 69 73 82   PROTEIN TOTAL g/dL 5.6*  --  5.6* 5.6* 5.2* 5.1* 5.3* 5.5*   ALBUMIN g/dL 2.3* 2.5* 2.4* 2.1* 2.1* 2.3* 2.3* 2.6*   A/G RATIO  0.7*  --  0.8* 0.6* 0.7* 0.8* 0.8* 0.9*         Results from last 7 days   Lab Units 03/10/22  1644 03/10/22  1118   TROPONIN T ng/mL 0.37* 0.37*             Recent Imaging:  CT angiogram pulmonary    Result Date: 03/15/2022  [facility] - [id number] PROCEDURE INFORMATION: Exam: CTA Chest With Contrast Exam date and time: 03/15/2022 8:07 PM Age: 66 years old Clinical indication: Concern for pe TECHNIQUE: Imaging protocol: Computed tomographic angiography of the chest with contrast, including non-contrast images if  performed. Exam focused on the arteries. 3D rendering (Not supervised by radiologist): MIP and/or 3D reconstructed images were created by the technologist. Radiation optimization: All CT scans at this facility use at least one of these dose optimization techniques: automated exposure control; mA and/or kV adjustment per patient size (includes targeted exams where dose is matched to clinical indication); or iterative reconstruction. REPORTING DATA: Count of CT and Cardiac NM exams in prior 12 months: This patient has received 5 known CTs and 0 known cardiac nuclear medicine studies in the 12 months prior to the current study. COMPARISON: CT ANGIOGRAM CHEST 12/08/2021 11:39 AM FINDINGS: Tubes, catheters and devices: Distal tip of nasogastric tube corresponds to the distal stomach. Pulmonary arteries: The pulmonary arteries are not well opacified particularly at the medium to small artery level. Additionally there is moderate breathing motion artifact. No pulmonary artery filling defect is visible. Aorta: There is ectasia of the ascending aorta up to 35 mm. Lungs: There is patchy bilateral pulmonary infiltrate more extensive on the right than left. There is bibasilar atelectasis with a suboptimal volume of inspiration. Pleural spaces: No pneumothorax or pleural effusion. Heart: There is moderate cardiomegaly. Previous aortic valve replacement. Lymph nodes: Unremarkable. No enlarged lymph nodes. Diaphragm: There is a small hiatal hernia. Bones/joints: No acute findings. Soft tissues: Unremarkable. Other findings: No acute findings are seen below the diaphragm. IMPRESSION: 1.   There is no definite evidence of pulmonary embolism however medium or small pulmonary embolus could go undetected on the current study. 2.   There is patchy bilateral pulmonary infiltrate which more prominent on the right side and is suspicious for pneumonia. There is also bibasilar atelectasis with a suboptimal inspiratory volume 3.   There is  cardiomegaly but no pulmonary edema visible.     US VENOUS LOWER EXTREMITY RIGHT DUPLEX    Result Date: 03/15/2022  EXAMINATION: US VENOUS LOWER EXTREMITY RIGHT DUPLEX HISTORY: leg is warm and red, concern for DVT COMPARISON: None TECHNIQUE: A GE  scanner utilizing B-mode, pulsed Doppler and color flow imaging was used to insonate and visualize the lower extremity venous system. Compressions and/or augmentation were performed as appropriate. 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 The common femoral, femoral, popliteal, anterior/posterior tibial, and peroneal veins are patent and compressible.     IMPRESSION: 1. No evidence of right lower extremity DVT. 2. Suspected chronic, partially occlusive thrombus in the greater saphenous vein. Dictated by: Eliezer Champagne Electronically Signed by: Eliezer Champagne on 03/15/2022 9:23 AM Report ID#: [id number]     Echo 2d limited w doppler wo contrast    Result Date: 03/14/2022  Echo Limited --------------------------------------------------------------------------------------------------------- Dennie Bible. Name: Brandy [name], Brandy [name] Study Date: 03/14/2022 4:06pm Pat. NO: [id number] Referring  MD: Val Eagle Site: [facility] Sonographer: Elwanda Brooklyn, BS, RDCS DOB: [birth year] Age: 40 --------------------------------------------------------------------------------------------------------- PROCEDURE/STUDY QUALITY ---------------------------------------- ECHO 2D LIMITED W DOPPLER W/O CONTRAST -A limited transthoracic study was performed including 2D, M-mode, spectral, color-flow and Tissue Doppler imaging. View: This study is of adequate but not optimum image quality. INDICATION ---------------------------------------- Evaluate aortic valve replacement for vegetations MEASURE ---------------------------------------- LVOT Diam  1.9 cm LVOT Area  2.8 cm? LVOT Vmax  1.26 m/s LVOT Vmean  0.77 m/s  LVOT max PG  6.35 mmHg LVOT mean PG  2.97 mmHg LVOT VTI  22.2 cm LVOT SV  62.6 ml LVOT SVI  30.3 ml/m? LVOT CO  6.73 l/min LVOT CI  3.25 l/min/m? AV Vmax  2.73 m/s AV max PG  29.84 mmHg AV Vmean  2.17 m/s AV mean PG  20.00 mmHg AV VTI  48.3 cm Dimensionless Index VTI  0.46 AVA (Vmax)  1.3 cm? AVA (VTI)  1.3 cm? AVAI (VTI)  0.6 cm?/m? TR Vmax  2.16 m/s TR max PG  18.6 mmHg AORTIC VALVE ---------------------------------------- There is a normally functioning bioprosthetic aortic valve. It is well seated and does not rock. Transvalvular gradients are within expected limits for the valve. There is no evidence of aortic stenosis. There is no evidence of aortic regurgitation. MITRAL VALVE ---------------------------------------- The mitral valve appears structurally normal. The mitral valve has mild regurgitation. TRICUSPID VALVE ---------------------------------------- The tricuspid valve appears structurally normal. The tricuspid valve has trace/physiological regurgitation. The RSVP is estimated at 22-27 mmHg, which is normal. PULMONIC VALVE ---------------------------------------- Pulmonic valve appears structurally normal. The pulmonic valve has trace regurgitation. IMPRESSION ---------------------------------------- There is a normally functioning bioprosthetic aortic valve. It is well seated and does not rock. Transvalvular gradients are within expected limits for the valve. There is no evidence of aortic stenosis. There is no evidence of aortic regurgitation. The mitral valve appears structurally normal. The mitral valve has mild regurgitation. The tricuspid valve appears structurally normal. The tricuspid valve has trace/physiological regurgitation. The RSVP is estimated at 22-27 mmHg, which is normal. Pulmonic valve appears structurally normal. The pulmonic valve has trace regurgitation. COMPARISON STUDIES ---------------------------------------- Date of Comparison Exam: 03/07/2022, Comparison: When compared to  previous transthoracic study performed at [facility], no significant changes are noted.       Assessment & Plan:   ASSESSMENT:  Patient Active Problem List    Diagnosis   . Subarachnoid hemorrhage (HCC)   . Acute encephalopathy   . Septic shock (HCC)   . Acute pyelonephritis   . Acute renal failure, unspecified acute renal failure type (HCC)   . SAH (subarachnoid hemorrhage) (HCC)   . Encephalopathy acute   . History of amputation of right great  toe (HCC)   . Menopausal syndrome on hormone replacement therapy   . Long term (current) use of aromatase inhibitors   . HER2-positive carcinoma of breast (HCC)   . Thickened endometrium   . Malignant neoplasm of hepatic flexure (HCC)   . Axillary adenopathy   . 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 Anaphylaxis   . Back pain, thoracic   . Hyperparathyroidism (HCC)   . Colon cancer (HCC)   . Aortic valve disorder   . Heparin induced thrombocytopenia (HCC)   . Non-ischemic cardiomyopathy (HCC)   . Demand ischemia (HCC)   . Obesity   . Iron deficiency anemia   . Hilar lymphadenopathy- bilataeral    . Liver lesion- small hypodense lesion around falciform lig     Brandy [name] is a 66 y.o. 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     CARDIOVASCULAR:   #Severe Aortic Stenosis s/p Bioprosthetic Aortic Valve - 2017  POA. Complicates care if bacteremic. STAT ECHO shows normally functioning prosthetic valve with no signs of vegetation.  -TTE on 10/3 showed a normal appearing bovine aortic valve with no mention of vegetations  -Discussed TEE with  cardiology, especially given new findings of Janeway lesions and splinter hemorrhages. Plan for TEE 10/5.  - NPO at midnight    #HFrEF   POA. Does not appear in exacerbation, appears to have improved following valve replacement in 2017. STAT ECHO 9/26 shows EF 40-45%.  - Resume carvedilol (can be crushed)     PULM:   #Acute Hypoxic Respiratory Failure   POA. Likely due to pneumonia and fluid status described in detail elsewhere. No baseline O2 use. No known lung disease. S/p NG to decompress stomach after bagging by EMS. Down to 3L O2 via NC  - RTDP  - O2 to keep sats >90%, weaning as tolerated  - BIPAP while sleeping.    ID:   #MSSA bacteremia of unclear source  POA. Ongoing fevers as of 10/01. Blood cultures again positive 9/28. ID consult 9/29: Discussed the case with Dr. Bertell Maria at [facility] in [place] who recommended continuation of antibiotics to cover MSSA, Klebsiella, and atypicals, with removal of port and further imaging only if blood cultures are positive again, with the exception of TEE, which he strongly recommends given bovine aortic valve. Blood cultures turned positive and Dr. Broadus [name] graciously removed the port on 9/30 for source control. Blood cultures discontinued after 48 hours without positive cultures. Antibiotic course:   - Ceftriaxone, 9/26, 9/28-29  - Cefepime 9/26-9/28  - Vancomycin 9/26-29  - Cefazolin 9/29  - Unasyn 9/29 - 10/02  - Doxycycline 9/29 - 10/2  - Oxacillin 10/2-  Antibiotics for 14 days total. 9/26-10/10  Consider removal of CVC and placement of PICC line 10/5.  TEE to evaluate for endocarditis 10/5.  PRN tylenol    #Intrigenous candidal infection  - nystatin powder and Skinfold dry sheets    ENDO:   #Hyperparathyroidism s/p parathyroidectomy 2019  Complicates care. Monitor electrolytes.     RENAL:   AKI and kidney failure resolved. Continue strict I/Os.  - resume lasix 20mg  daily    GI/FEN:   #Hypervolemia  #  Free Water Deficit  Fluid overloaded with pulmonary edema on CXR  9/29.  Na trending up, up to 151 10/2. Maintenance fluids D5W 9/29. NG tube placed 9/30 and given free water enterally. 3.9 L free water deficit 10/2. Fluid and electrolyte status much improved. Nutrition to manage maintenance fluids.  - Titrating furosemide  - SLP recommends trial of ice chips and sips of water    #Refeeding syndrome.  Due to prolonged NPO status. Mg and phosphorus wnl. Repleting potassium. Increased tube feeding rate slowly to reach goal rate.  - Nutrition consulted  - Daily Mg and phosphorus labs in addition to BMP with repletion as indicated.    NEURO:   #Acute Metabolic Encephalopathy   POA. Likely due to severe sepsis, GCS initially 3. Treating the underlying causes. Intubation discussed at length with the husband, decided DNR/DNI. By 9/27 was moving spontaneously, whispering single words, and able to answer questions. Improved 10/1 to speaking in full sentences and initiating conversation. MRI w/ contrast to evaluate for causes of encephalopathy or SAH. No intracranial findings other than the known SAH and associated edema.  - Dilaudid PRN for pain   ?  #Trace Non-Aneurysmal Subarachnoid Hemorrhage  POA. Seen on CT head on admission. Discussed with Neurosurgery by ED physician, gave no recommendations for management. MRI 10/2 shows stable small SAH, no metastases.  - Seizure precautions   - Avoid anticoagulation for 4 weeks     SKIN/MSK: ?  #Pressure Injuries, Ecchymosis  POA. Multiple and scattered across back.   - Wound care consultation  - Frequent position changes   - thorough skin check for abscess or other infection source    HEME/ONC:   #Pancytopenia   #Hx HIT  #Coagulopathy of Sepsis  Developed over hospital course. At baseline, has iron deficiency anemia and chemotherapy-induced anemia. Hgb intermittently dropped, requiring blood transfusions, now s/p 3upRBCs. On 9/30, ANC 1500.   - Neutropenic precautions  - Continue to trend H&H  - TT Hgb <8  - Avoid heparin   - Monitor for  bleeding   ?  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 Following with Dr. Nadeen Landau at [facility], currently on Docetaxel/Trastuzumab. Metastatic to lymph node. Last treatment 02/09/2022. Required blood transfusions due to chemotherapy.   ?  #Colon cancer s/p colectomy 2018  Complicates care, bowel regimen.     Resolved Hospital Problems  #Septic Shock: Severe and refractory. Appropriately fluid resuscitated. S/p stress dose steroids. Levophed 9/26-9/27.    #Respiratory Alkalosis: Tachypneic due to sepsis/hyperthermia  .  #Rhabdomyolysis: Mild. McMahon Score: high risk. Fluid resuscitated. CK peaked 9/27 at 5,722 and then decreased.    #Anion Gap Metabolic Acidosis: POA. Likely from shock and renal failure. Lactate normal.     #Hyperthermia: Temperature initially nearly 105F, likely contributed to rhabdomyolysis. She was cooled using fans, IVF, APAP.      #Supraventricular Tacycardia  Developed on 9/29, lasted a few minutes and terminated without intervention. Ddx includes MI, PE, a fib, other arrhythmia, sepsis, anemia, electrolyte abnormality. History of afib. New ST depressions in V3 and V6 on EKG. Troponin of 0.37 > 0.37. Dr. Marcene Brawn consulted, greatly appreciate recommendations.   - Amiodarone gtt discontinued. If arrhythmia occurs, will require long acting antiarrhythmic such as amiodarone.  - Resume home carvedilol (can be crushed and administered through NG tube).    #UTI (resolved)  POA. Asymptomatic. Culture grew Klebsiella pan-sensitive. For source control, replaced foley 9/28.    #Community acquired pneumonia   POA. R  lower lobe. Repeat CXR 9/29 appears worse, most consistent with pulmonary edema given clinical presentation of hypervolemia.  - ID consult: appreciate recommendations to cover atypicals in case of pneumonia.    #Acute Renal Failure   POA. Cr normal at baseline, 4.21 on admission. Renal US showed no solid mass or hydronephrosis, though CT showed calculus in bladder that may have recently  passed. Creatinine improving s/p fluid resuscitation.   - Foley, strict I/O   - Pharmacy consulted to review medications     #Acute Hepatitis   POA. Likely shock liver. RUQ US shows diffuse increased echogenicity of the liver which could indicate fatty infiltration, hepatitis, or cirrhosis. LFTs are trending downward.      Feeding: Per Nutrition, osmolite 1.2 @63  ml/hr. Tolerating sips of water. Free water flushes per nutrition  Analgesia: Dilaudid PRN  Sedation: None  Thromboprophylaxis: None - subarachnoid hemorrhage   Head of bed: 30 degrees  Ulcer prophylaxis: PPI  Glucose: 5.5% (07/27/2020); 6.0% (01/26/2016); 5.2% (01/21/2016)   Bowel regimen: PRN  I - Lines/Drains/Airway: CVC R IJ 9/26, R radial arterial line 9/26-9/28, NG tube 9/26-9/27, 9/30 Foley 9/26  Deescalate antibiotics - ongoing and complex course, see above    CODE: DNR/DNI per multiple conversations with patient's husband    Verbal MDPOA: Husband, [name]  DISPO: PCU, guarded    Social: Lives with husband, Jonny Ruiz, who is MDPOA      Zain Lankford   03/15/2022  5:57 AM

## 2022-03-15 NOTE — Progress Notes (Signed)
Hospitalist Progress Note    I saw, evaluated, and discussed the patient with the Resident. I agree with the resident's findings and plan of care. Please see Resident's note for full details. Additionally, see below:    Objective:   BP (!) 117/58 (BP Location: Left arm, Patient Position: Lying)   Pulse 109   Temp 36.2 ?C (97.1 ?F) (Temporal)   Resp 15   Ht 5' 3 (1.6 m)   Wt 249 lb 3.2 oz (113 kg)   LMP  (LMP Unknown)   SpO2 94%   BMI 44.14 kg/m?    Assessment:   Active Hospital Problems    Diagnosis SNOMED CT(R) Date Noted   . Subarachnoid hemorrhage (HCC) HEMORRHAGE INTO SUBARACHNOID SPACE OF NEURAXIS 03/07/2022   . Acute encephalopathy DISORDER OF BRAIN 03/07/2022   . Septic shock (HCC) SEPTIC SHOCK 03/07/2022   . Acute pyelonephritis ACUTE PYELONEPHRITIS 03/07/2022   . Acute renal failure, unspecified acute renal failure type (HCC) ACUTE KIDNEY INJURY 03/07/2022   . SAH (subarachnoid hemorrhage) (HCC) HEMORRHAGE INTO SUBARACHNOID SPACE OF NEURAXIS 03/07/2022   . Encephalopathy acute DISORDER OF BRAIN 03/07/2022   . 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 Liver lesion- small hypodense lesion around falciform lig LESION OF LIVER 01/21/2016       Plan:   #MSSA Bacteremia: Port removed. On appropriate abx. CRP improving. No sight of current seeding. Low threshold for additional imaging. No recent fever. TEE tomorrow. NPO at midnight. Plan for tunneled PICC tomorrow as well by IR. Recent cultures remain negative.  #RLE saphenous DVT reaching to junction with larger vein.: High DVT risk with limited motion. Cancer. CTA ordered to evaluate for PE. Plan for IVC filter as patient is contraindicated for anticoagulation with hx of HIT and recent subarachnoid hemorrhage.  #On going hypoxica: follow up CT scan. Has been treated for pneumonia.  #JFrEF: on carvedilol. Continue medication optimization  #Free water deficit: decreasing free water flushes. Will discuss with  dietary to routine maintenance.  #Trace?Non-Aneurysmal?Subarachnoid Hemorrhage: stable on repeat imaging. Contraindicated for anticoagulation.  #Pancytopenia in setting of chemotherapy and sespsis. On neutropenic precautions.

## 2022-03-15 NOTE — Consults (Signed)
Central Line Declotting Procedure Note    Procedure:  Declotting of  All 3 Lumen of  I.J.,     Indications:   unable to draw blood,  Procedure Details   Risk of infection discussed with patient.    Name, date of birth, allergies and CSN were verified.    Procedure completed per protocol.    Alteplase 2mg /ml instilled per protocol.    Blood return:  yes following instillation.    Findings:  Catheter was flushed with 30 mL NSS. Patient did tolerate procedure well.

## 2022-03-15 NOTE — Treatment Summary (Signed)
Inpatient Speech Pathology - Daily Note  Dysphagia    [name] / [id number] DOB:  1955-06-23 / 66 y.o. Date:  03/15/2022     Start/Stop Time: 1000-1030                                             SUBJECTIVE   Patient was agreeable to participate with skilled swallowing therapy. Comment: the pt was kind and cooperative.     Patient/family reported: that she was feeling good today. She stated that she has been cautious with swallowing practice and is not interested in too much too soon    RN staff reported: that she appears to be doing well; RN stated that she will be made NPO tonight in lieu of a procedure that is scheduled for tomorrow    Other: SLP completed oral care with pt prior to PO trials.  MD team was present at the end of the swallow treatment session and stated that her hands are more swollen today than yesterday due to fluids and they will give less today    Pain: None reported by patient.    OBJECTIVE      Skilled SLP services for dysphagia management provided. Services included: therapeutic diet upgrade trials, instruction and training in compensatory strategies and patient/caregiver education    Mental status  WFL     Respiratory Status: pt was initially on a BiPap when SLP arrived, but was switched to a oxmizer easily and maintained appropriate saturations throughout the session    O2 Status:  oxymizer, Bi- Pap/CPAP prior to ST and adequate O2 saturations during ST    Items presented: ice chips, thin liquid and applesauce     Oral Phase: no obvious deficits    Pharyngeal Phase: no obvious deficits    Signs/Symptoms Penetration/Aspiration were: none    Esophageal Signs/Symptoms: None    Training for Compensatory Strategies- Strategies instructed during PO trials included: small sips/bites, pacing, avoiding chugging of liquids and effortful swallow    Compensatory Strategy Performance: patient required min cueing for adequate return demonstration of strategies    Education: Instructed  patient/family regarding results/recommendations, instructed RN regarding results/recommendations, instructed MD regarding results/recommendations, compensatory swallowing strategies, oral hygiene and options for nutrition (including alternative nutrition)    ASSESSMENT     Clinical Impressions:  moderate oropharyngeal dysphagia. With comparison to previous session, patient demonstrates: improving swallow function/safety, improving use of compensatory strategies, improving knowledge of dysphagia, improved PO intake and stable mental status. Today patient was able to participate in diet upgrade trials. Patient benefits from cues, encouragement, support with getting utensils to her mouth.     Patient's current deficits in: swallow function have the potential to cause/contribute to: increased risk of aspiration or choking, inadequate nutrition/hydration, decrease in overall health and well-being and decreased quality of life. Skilled dysphagia management was provided to: maximize patient's swallow safety, maximize patient's ability to participate with swallowing/meals, maximize patient's ability to achieve optimal nutrition/hydration and maximize comfort/quality of life for patient though PO intake    Recommendations:   Diet recommendations:  NPO officially, but after good oral care, pt is ok to trial small sips of water, ice chips, and tsps of applesauce as she is interested    Compensatory Strategies: Excellent/frequent oral hygiene , small sips, small bites, avoid chugging of liquids, eat and drink carefully and slowly, Effortful swallow,  avoid straws, avoid PO when fatigued/short of breath, PO only when alert, monitor temperature and lung sounds, hold PO if RR is 30 breaths/min or higher    Medications: Not safe to give orally just yet     Additional diagnostic/treatment considerations: Modified Barium Swallow Study may be beneficial in the near future if pt can tolerate this procedure     Consult  recommendations  Dietitian     PLAN   Next session will focus on: swallowing re-assessment at bedside, assessment of readiness to upgrade diet, compensatory strategy training, swallowing exercises, education for patient/family and/or RN    Goal 1:  Patient will tolerate pureed (IDDSI 4) diet textures and thin (IDDSI 0) liquids with swallow WFL for safe and adequate nutrition/hydration.    Discharge recommendations: unknown (depending on patient progress)    Treatment Plan Frequency   Speech/Language/Cognitive therapy to include: swallow assessments and treatment as indicated, assessment of diet tolerance and readiness to upgrade diet, monitoring of swallow safety, compensatory strategy training and patient/family/RN education  5 x  per week during Inpatient stay.  Frequency will be modified as patients condition indicates.    Change in frequency on POC:No     Dorathy Bodenhamer will remain on the Speech Pathology schedule until goals are met, or there has been a change in the Plan of Care.      Speech Language Pathologist: Elizebeth Brooking, CCC-SLP

## 2022-03-15 NOTE — Consults (Signed)
Discussed IV access limited.  Veins are too small for a PICC line. Requested order to declot central line.

## 2022-03-15 NOTE — Other (Signed)
SPEECH LANGUAGE PATHOLOGY BRIEF NOTE - INPATIENT     Patient name: Brandy [name]    SLP checked in with RN.  The pt had an oxygen machine on her face and was getting blood. Not ready for therapy yet, SLP team will attempt back later.    Thank you,     Speech Language Pathologist:  Elizebeth Brooking, CCC-SLP  Date: 03/15/2022  Time:  8:59 AM

## 2022-03-15 NOTE — Plan of Care (Signed)
Problem: Knowledge Deficit  Goal: Patient/family/caregiver demonstrates understanding of disease process, treatment plan, medications, and discharge instructions  Description: Complete learning assessment and assess knowledge base.  Outcome: Progressing  Plan of care discussed with patient ans spouse at bedside. Continue to be available to answer any questions and/or concerns.      Problem: Pain  Goal: Patient will achieve activity goals and show improvement in function, mood, and coping  Outcome: Progressing  Goal: Pain level within patient's desired comfort-function goal  Outcome: Progressing  Goal: Patient will verbalize understanding and be an active participant in the pain management care plan  Outcome: Progressing  Goal: Patient will be able to identify strategies to reduce anxiety and improve coping  Outcome: Progressing   [Press F2 to go to next section.  Wild Cards (* * *) must be deleted to sign]    Prior history  No data found.  No data found.    Medications and/or interventions  No data found.    Patient is narcotic  naive    What worked best for patient pain?      Dilaudid IV x1 per [facility] for c/o right leg pain especially with movement.    What  Non-Pharmacological Interventions were effective for patient     Rest and pillow support; heel protectors and passive ROM.    Recommendations for next shift(s), (May want to try. combine up etc.)   Perform a comprehensive assessment of pain per unit protocol, assess patient's willingness or ability to explore range of techniques aimed at controlling pain and evaluate the patient's response to pain and management strategies.

## 2022-03-15 NOTE — Treatment Summary (Signed)
IP PT TREATMENT NOTE     [name] / [id number] DOB:  10-10-1955 / 66 y.o. Date:  03/15/2022     Precautions  Specific mobility precautions: Potential for falls identified by PT team     ASSESSMENT     Summary  Brandy [name] had fair tolerance to therapy today she was able to complete all bed mobility with max assist and complete 2 sit to stands in the [place] steady with max assist.  Patient easily fatigues with any mobility, but is very motivated to participate.  Given this and current deficits would recommend further therapies at TCU prior to returning home.     Discharge recommendations  Primary recommendation: Patient would benefit from SNF/Nursing Home/Other Skilled Facility.     Mobility recommendations  Mobility Equipment needs: To be determined in post-discharge setting  Precaution Awareness: No specific precautions  Deficit Awareness: Fully aware of deficits  Correction of Errors: Self-corrects with cues  Safety Judgment: Unable to assess      PLAN     Treatment Plan Frequency    Plan: Pt. demonstrates limitations which require skilled physical therapy intervention.  1x (5 days/week).            Treatment Diagnosis Surgery / # Days Post-op (if applicable)    Primary Dx: s/p Septic Shock  Treatment focus: Impaired functional mobility  Procedure(s) (LRB):  INFUSION CATHETER Removal (N/A) / 4 Days Post-Op       SUBJECTIVE   Brandy [name] reports she has had a little more swelling today and that her right ankle is sore. Eager to attempt standing and getting out of bed.      Pain Level  Current status (PT): Pain somewhat limits pt's ability to participate in therapy (moderate)  Pain at rest: Mild  Pain with activity: Moderate  Pain location: Leg (Bilateral LEs)   Social history  Lives: with spouse  Support Systems: Spouse (bad back), family, friends   Prior LOF  Prior mobility status: walked using a four wheeled walker  Home/Community: Pt. was independent in home/needed assist in community  --> Received help from:  Spouse  --> Needed assistance for: Household maintenance (yard work), Driving   Home environment  Living situation: lived in a house  Home style: one level  Number of stairs to enter home: 4-5   Home equipment  Mobility Equipment: Four wheeled walker     OBJECTIVE   Patient was supine in bed upon entry and was agreeable to physical therapy intervention.  SPO2 and heart rate were monitored and remained within normal limits throughout session and patient remained on oxymizer throughout session.  Patient required mod to max assist to complete all bed mobility was able to provide some assistance with lower extremities to complete supine to sit transfer.  Once seated the edge of the bed seated balance appeared improved today, but was only able to maintain with bilateral upper extremity support.  [facility] was brought and patient was able to complete 2 sit to stand transfers each lasting approximately 10 seconds with the patient required max assist x2 to complete.  After the second trial patient was excessively fatigued and requested to return to supine and required dependent level assistance to safely transfer back into supine.  Patient was positioned for comfort and we further discussed therapy goals, DME needs, DC plan.  Patient was then left safely in bed with call light within reach all the needs addressed this time.     Mental status  General Demeanor: Pleasant, Cooperative  LOC: Alert  Directions: Follows multi-step commands  --> Follows multi-step commands: Consistently  Attention span: Appears intact  Memory: Appears intact in social/therapy situations   Medical appliances  Medical Appliances: Oxygen, IV, Central line, Catheter, NG feeding tube, Telemetry, BP monitor, SPO2 monitor, HR monitor     TREATMENT  Bed Mobility/Transfers  Bed mobility       Transition to sitting up Supine to Sit: Mod Assist, Max Assist, Verbal cues   To resting position      Transition to sitting down Max Assist, Verbal cues, Tactile  cues     Transition to supine Dependent     Scooting Dependent   Transfers      Transition to standing From bed: Max Assist, Verbal cues, Tactile cues, Armhold assist in lieu of assistive device      Balance  Balance - Basic  Assist level: CGA, Min assist  Assist level: Mod Assist  Static standing balance: Assist level  Assist level: Max Assist, Dependent      Training/Education  Primary Learner: Patient  Engineer, production - Barriers to Learning?: No  Training provided: PT PoC, bed mobility, transfer training, fall prevention, DME needs, and D/c planning   Safety/Room Set-up   Call button accessible?: Yes  Oxygen reconnected?: No, not disconnected during therapy  Position on arrival: In bed  Position on departure (bed): Standard bed     GOALS     Patient/Caregiver goals reviewed and integrated with rehab treatment plan:     Multidisciplinary Problems     Multidisciplinary Problems (Active)        Problem: IP General Goal List - 2    Goal Priority Disciplines Outcome   Bed Mobility     PT    Description: Pt. to perform bed mobility with min assist.   Transfers     PT    Description: Pt. to perform all functional transfers with min. assist.   Ambulation - Levels     PT    Description: Pt. to ambulate with min assist x 20 feet with appropriate assistive device.   Home Exercise Program     PT    Description: Pt. to perform basic HEP with min assist.   Caregiver Training     PT    Description: Patient/caregiver training will be provided, as needed to achieve goals.                         First session Second session (if applicable)    Start/Stop times: 1445 - 1515 Start/Stop times:   - Stop Time:     Total time: 30 minutes  Total time:   minutes   TIME TOTALS  Timed minutes: 30  Total minutes (All Sessions): 30    This note to serve as a Discharge Summary if Ondria Bomba is discharged from the hospital or from therapy services.     Therapist:  Barkley Boards, PT, DPT         Portions of this note were generated with voice  recognition software.  Some words and phrases may be phonetically similar, but different from what was actually dictated.  Edits and corrections were performed to the best of my ability when identified during concurrent proofreading.  Please excuse any unintentional verbiage, homonyms, or grammatical errors that may be present and may not truly reflect my intent.  Feel free to contact me for clarification if any questions arise relating  to the wording of this document.

## 2022-03-16 ENCOUNTER — Inpatient Hospital Stay: Admit: 2022-03-16

## 2022-03-16 ENCOUNTER — Ambulatory Visit

## 2022-03-16 ENCOUNTER — Encounter: Attending: Medical Oncology

## 2022-03-16 ENCOUNTER — Inpatient Hospital Stay: Admit: 2022-03-17

## 2022-03-16 ENCOUNTER — Ambulatory Visit: Admit: 2022-03-16 | Discharge: 2022-04-05 | Attending: MD

## 2022-03-16 DIAGNOSIS — Z170 Estrogen receptor positive status [ER+]: Secondary | ICD-10-CM

## 2022-03-16 LAB — CBC WITH AUTO DIFFERENTIAL
HCT: 24.5 % — ABNORMAL LOW (ref 35.0–48.0)
Hemoglobin: 8.3 g/dL — ABNORMAL LOW (ref 11.7–16.5)
MCH: 30.4 pg (ref 28.3–33.3)
MCHC: 34.1 g/dL (ref 32.5–36.0)
MCV: 89.2 fL (ref 81.0–100.0)
MPV: 8.4 fL (ref 6.9–10.0)
Platelet Count: 76 10*3/ÂµL — ABNORMAL LOW (ref 150–405)
RBC: 2.74 10*6/ÂµL — ABNORMAL LOW (ref 3.80–5.60)
RDW: 19.4 % — ABNORMAL HIGH (ref 11.7–16.1)
WBC: 1.9 10*3/ÂµL — ABNORMAL LOW (ref 4.5–11.0)

## 2022-03-16 LAB — CELLAVISION DIFFERENTIAL
Basophils %: 0 % (ref 0–2)
Basophils, Absolute: 0 10*3/ÂµL (ref 0.0–0.1)
Eosinophils %: 0 % (ref 0–5)
Eosinophils, Absolute: 0 10*3/ÂµL (ref 0.0–0.4)
Lymphocytes %: 25 % (ref 20–40)
Lymphocytes, Absolute: 0.5 10*3/ÂµL — ABNORMAL LOW (ref 1.0–4.0)
Monocytes %: 9 % (ref 2–12)
Monocytes, Absolute: 0.2 10*3/ÂµL (ref 0.2–1.0)
Neutrophils %: 66 % (ref 50–74)
Neutrophils, Absolute: 1.3 10*3/ÂµL — ABNORMAL LOW (ref 2.0–7.4)
Platelet Estimate: DECREASED — AB
Total Cells Counted: 100
WBC: 1.9 10*3/ÂµL — ABNORMAL LOW (ref 4.5–11.0)
nRBC: 1 /100

## 2022-03-16 LAB — COMPREHENSIVE METABOLIC PANEL
ALT - Alanine Aminotransferase: 24 IU/L (ref 5–33)
AST - Aspartate Aminotransferase: 39 IU/L — ABNORMAL HIGH (ref 5–32)
Albumin/Globulin Ratio: 0.6 — ABNORMAL LOW (ref 1.2–2.2)
Albumin: 2.2 g/dL — ABNORMAL LOW (ref 3.5–5.0)
Alkaline Phosphatase: 94 IU/L (ref 35–105)
Anion Gap: 10.8 mmol/L (ref 9.0–18.0)
BUN / Creatinine Ratio: 29.7 — ABNORMAL HIGH (ref 12.0–20.0)
BUN: 33 mg/dL — ABNORMAL HIGH (ref 8–20)
Bilirubin Total: 0.9 mg/dL (ref 0.10–1.70)
CO2 - Carbon Dioxide: 24 mmol/L (ref 17–27)
Calcium: 8 mg/dL — ABNORMAL LOW (ref 8.6–10.6)
Chloride: 104.2 mmol/L (ref 101.0–111.0)
Creatinine: 1.11 mg/dL (ref 0.60–1.30)
Glomerular Filtration Rate Estimate (Female): 55 mL/min/{1.73_m2} — ABNORMAL LOW (ref >=60)
Glucose: 105 mg/dL (ref 74–106)
Osmolality Calculation: 285.2 mOsm/kg (ref 275.0–300.0)
Potassium: 3.46 mmol/L — ABNORMAL LOW (ref 3.50–5.10)
Protein Total: 5.9 g/dL — ABNORMAL LOW (ref 6.4–8.2)
Sodium: 139 mmol/L (ref 135–145)

## 2022-03-16 LAB — MAGNESIUM: Magnesium: 1.2 mg/dL — ABNORMAL LOW (ref 1.3–2.5)

## 2022-03-16 LAB — PHOSPHORUS: Phosphorus: 3.3 mg/dL (ref 2.5–4.7)

## 2022-03-16 MED ORDER — potassium chloride IVPB 20 mEq/100 mL
20 | Freq: Once | INTRAVENOUS | Status: AC
Start: 2022-03-16 — End: 2022-03-16
  Administered 2022-03-16: 20 meq via INTRAVENOUS
  Administered 2022-03-17: 01:00:00 20100 meq via INTRAVENOUS

## 2022-03-16 MED ORDER — iopamidoL (ISOVUE-370) 370 mg iodine /mL (76 %) injection 100 mL
370 | Freq: Once | INTRAVENOUS | Status: AC
Start: 2022-03-16 — End: 2022-03-15
  Administered 2022-03-16: 03:00:00 370 mL via INTRAVENOUS

## 2022-03-16 MED ORDER — potassium chloride IVPB 20 mEq/100 mL
20 | Freq: Once | INTRAVENOUS | Status: AC
Start: 2022-03-16 — End: 2022-03-16
  Administered 2022-03-17: 01:00:00 20 meq via INTRAVENOUS
  Administered 2022-03-17: 03:00:00 20100 meq via INTRAVENOUS

## 2022-03-16 NOTE — Discharge Planning (AHS/AVS) (Signed)
DISCHARGE PLANNING ASSESSMENT      Plan: TCU/SNF      PATIENT HISTORY     Primary Diagnosis: Acute pyelonephritis [N10]  Encephalopathy acute [G93.40]  SAH (subarachnoid hemorrhage) (HCC) [I60.9]  Acute encephalopathy [G93.40]  Acute renal failure, unspecified acute renal failure type (HCC) [N17.9]  Septic shock (HCC) [A41.9, R65.21]          Support Systems/Services (Current)  Living Arrangements: Spouse/significant other  Type of Residence: Private residence  Home Environment: Single level, Entry level steps  Number of Steps: 5  Support Systems: Spouse/significant other, Family members, Friends/neighbors  Is support system willing to care for Pt?: Transportation assistance(move or transport), Decision assistance, Physical assistance, Financial assistance           Resource Management Assessment  Referral Source: Provider  Referral Reason: Other (Comment) (TCU/SNF)    ASSESSMENT     Prior Level of Function:  Prior Level of Function  Potential Risk Factors: Severity of injury/illness  Home Environment: Single level, Entry level steps  Number of Steps: 5  Mentation: Oriented x 3  Mobility: Ambulatory, Transfers with assist  Assistive/Corrective Devices: FW Walker, Dentures     Complicating Factors/Barriers:TCU/SNF      General Overview: 03/16/22 8:32 AM this CM spoke with [name] husband [name] and We reviewed my role as a case manager & patient's POC, including discharge planning.  -pt lives with her husband Jonny Ruiz in a single level home with 5 steps.  -this CM explained to Jonny Ruiz that PT did their initial assessment and recommended TCU/SNF for discharge planning.  -This CM explained to Jonny Ruiz that pt's information will be sent to multiple facilities including [facility].   -This CM has sent pt's information to multiple TCU/SNF facilities and will follow-up with Jonny Ruiz once a facility has accepted pt.  -This CM will continue to follow and assist with discharge needs/planning.      Discharge Planning: [place]

## 2022-03-16 NOTE — Pre Sedation (Signed)
Patient transported to PCU via bed. Left IJ dressing is dry and intact. Report given to Alan Ripper, Charity fundraiser.  Patient is arousable and responds to verbal stimuli.  The patient has no complaints of pain or discomfort and is in no respiratory distress. Please see cathlab log for medication or additional information.

## 2022-03-16 NOTE — Other (Signed)
SPEECH LANGUAGE PATHOLOGY BRIEF NOTE - INPATIENT     Patient name: Brandy [name]    SLP checked in with RN team, pt is still NPO in lieu of procedure that is tentatively scheduled for 1100.  SLP and RN discussed current diet and returning to that diet after her procedure.     Current diet is NPO with ice chips, sips of water, and small bites of applesauce allowed with supervision and only after oral care. Pt should return to this diet after current NPO for procedure status.     Thank you,     Speech Language Pathologist:  Elizebeth Brooking, CCC-SLP  Date: 03/16/2022  Time:  8:47 AM

## 2022-03-16 NOTE — Consults (Signed)
Attempted PICC line.  Placed needle in artery Pressure help for 15 min then pressure dressing applied. Dr Shelva Majestic notified of failed attempt.

## 2022-03-16 NOTE — Progress Notes (Signed)
Wound/Ostomy Care:  ?  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  ?  Wound Care consulted/following for?evaluation and care of wounds on back and buttocks.  ?  Today:?Patient awake and alert, nods head appropriately to questions, follows commands as able but is extremely weak. Left heel Deep Tissue Pressure Injury noted. Patient has numerous ecchymoses all over her body. Moisture injury of the skin with yeast component under the abdominal pannus, within the bilateral inguinal folds and also within the gluteal cleft. Nystatin powder ordered, add SkinFold Dry Sheets. Right dorsal foot area of non-blanching purple discoloration which may represent a pressure injury or ecchymosis. Husband at bedside.?  ?  10/2 - Patient awake and alert, asking for ice chips. On O2 mask. Left heel Deep Tissue Pressure Injury stable at this time. Offloaded. Awaiting final outcome of demarcation.     10/5 - No significant change in left heel Deep Tissue Pressure Injury. Bordered foam dressing changed, left heel in sheepskin pad and floated off the bed surface.   ?  O:?Blood pressure 118/74, pulse 87, temperature 36.1 ?C (96.9 ?F), temperature source Temporal, resp. rate 20, height 5' 3 (1.6 m), weight 255 lb 11.2 oz (116 kg), SpO2 95 %, not currently breastfeeding.    See assessment flowsheet for full wound details.   ?  Photos-?03/13/2022  ?    Left heel    Braden Scale score - 13, moderate risk  ?  A:?Left heel Deep Tissue Pressure Injury, present on admission.  Gluteal cleft Irritant Contact Dermatitis with linear partial thickness tissue loss 2/2 incontinence of stool and presence  of yeast.  Abdominal pannus and bilateral inguinal fold Irritant Contact Dermatitis with linear partial thickness tissue loss 2/2 excess skin moisture and presence of yeast.  Numerous ecchymoses on the back, buttocks and extremities.  ?  P:?Local wound and skin care, moisture barrier, antifungal powder, pressure offloading.  ?  Intertrigo -?  Cleanse affected areas with bath cloths and dry well.   Apply Nystatin powder to skin folds and rub in well, Q 12 hours. Tuck Skinfold dry sheets into skin folds.  Apply Clear Aid moisture barrier ointment to gluteal cleft and perineum twice daily and PRN.  Apply Allevyn sacral silicone dressing to sacral area for protection/prevention.  ?  Left heel -?  Cleanse with wound cleanser spray and pat dry.  Wipe periwound skin with skin barrier/prep wipe and allow to dry.  Apply Allevyn silicone bordered foam dressing and change Q 3 days and PRN.  ?  Provide pressure offloading measures; recommend W[place]le air mattress overlay, turn/reposition Q 2 hours and PRN using pillows/wedges to offload the sacrum, float heels off the bed surface using pillows or boots, pad bony prominences.?  ?  Will follow.  ?  Thank you.  ?  K. Dareen Piano, BSN, RN, [employer]  [phone]

## 2022-03-16 NOTE — Pre Sedation (Addendum)
Patient transported to PCU via bed. Left chest/neck dressing is dry and intact.  No hematoma present.  Report given to April, RN.  Patient is arousable and responds to verbal stimuli.  The patient has no complaints of pain or discomfort and is in no respiratory distress. Please see cathlab log for medication or additional information.

## 2022-03-16 NOTE — Progress Notes (Signed)
Patient reporting to CNA lower abdominal pain/cramping. No BM noted by CNA. Patient found sleeping upon assessment. Will update day nurse to continue to monitor.

## 2022-03-16 NOTE — Plan of Care (Signed)
Problem: Knowledge Deficit  Goal: Knowledge - personal safety  Description: Patient will verbalize understanding of fall prevention.  Outcome: Progressing   Patient remains on neutropenic precautions. Educated on plan of care including CT of chest ordered to r/o PE. Results to be disclosed by medical team. Patient verbalizes understanding.  Problem: Pain  Goal: Patient will achieve activity goals and show improvement in function, mood, and coping  Outcome: Progressing   Medicated once with Dilaudid per [facility] for c/o right leg pain. Effectiveness reported. Passive ROM to legs performed. Patient also had PT.

## 2022-03-16 NOTE — Progress Notes (Signed)
Opened in error

## 2022-03-16 NOTE — Progress Notes (Addendum)
Hospitalist Progress Note    Subjective:  Reports resting comfortably overnight. Wants to keep fighting the cancer.       Objective:   BP 111/66 (BP Location: Left forearm, Patient Position: Lying)   Pulse 108   Temp 36.3 ?C (97.4 ?F) (Temporal)   Resp 22   Ht 5' 3 (1.6 m)   Wt 255 lb 11.2 oz (116 kg)   LMP  (LMP Unknown)   SpO2 92%   BMI 45.30 kg/m?    General: obese female laying in bed, weak/hoarse voice   Cardiac: RRR, II/VI systolic murmur  Pulm: CTAB   Ext: splinter hemorrhages and Janeway lesions present     Assessment:   Active Hospital Problems    Diagnosis SNOMED CT(R) Date Noted   . Subarachnoid hemorrhage (HCC) HEMORRHAGE INTO SUBARACHNOID SPACE OF NEURAXIS 03/07/2022   . Acute encephalopathy DISORDER OF BRAIN 03/07/2022   . Septic shock (HCC) SEPTIC SHOCK 03/07/2022   . Acute pyelonephritis ACUTE PYELONEPHRITIS 03/07/2022   . Acute renal failure, unspecified acute renal failure type (HCC) ACUTE KIDNEY INJURY 03/07/2022   . SAH (subarachnoid hemorrhage) (HCC) HEMORRHAGE INTO SUBARACHNOID SPACE OF NEURAXIS 03/07/2022   . Encephalopathy acute DISORDER OF BRAIN 03/07/2022   . 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 Liver lesion- small hypodense lesion around falciform lig LESION OF LIVER 01/21/2016       Plan:   #MSSA Bacteremia: Port removed. On appropriate abx. CRP improving. No sight of current seeding. Low threshold for additional imaging. No recent fever. TEE 10/5 without e/o vegetations. IR attempted tunnelled catheter and then Satira Anis attempted PICC placement, both of which failed. Will need to assess for other access options. Recent cultures remain negative.  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 D/w Dr.Kuchinsky and no indication for valve replacement or surgical consultation. Continue with oxacillin per MSSA Bacteremia problem.   #RLE saphenous DVT reaching to junction with larger vein.: High DVT risk with limited  motion. Cancer. CTA Pulm negative for PE on 10/4. Discussed with Dr. Harlen Labs on 10/5 who recommended observation of the saphenous vein DVT with venous duplex in 4-5 days (10/9-10/10). 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   #On going hypoxia: Multifactorial 2/2 CAP (treated), poor inspiratory effort. CTA Pulm neg for PE.  #HFrEF: on carvedilol. Continue medication optimization  #Free water deficit: decreasing free water flushes. Will discuss with dietary to routine maintenance.  #Trace?Non-Aneurysmal?Subarachnoid Hemorrhage: stable on repeat imaging. Contraindicated for anticoagulation.  #Pancytopenia in setting of chemotherapy and sespsis. On neutropenic precautions.      #Nutrition: NGT feeds switched to bolus today. May need to consider PEGT, but will discuss with SLP first.     Code: DNR/DNI, no code  Dispo:PCU     [address]

## 2022-03-16 NOTE — Progress Notes (Signed)
Daily Progress Note     Name: Brandy [name]  DOB: 23-Feb-1956 66 y.o.  MRN: [id number]  CSN: [id number]    Assessment & Plan:   Infection associated with central venous catheter  Patient was admitted to the hospital with evidence of sepsis.  Blood cultures initially positive for Staph aureus.  Port was removed on September 30 and grew Staph aureus as well.  Blood cultures have since remained negative since September 29. Patient has history of aortic valve replacement.  No concerning findings on transthoracic echocardiogram.  There is some possibility of vascular phenomena.  -Proceed with planned transesophageal echocardiogram to assess for infective endocarditis.  This diagnosis is certainly possible.  -Primary medical team has been in contact with infectious disease at [facility] in [place].    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  There is some concern that this may bridge to the deep vein system.  Patient does have breast cancer on chemotherapy.  She has pancytopenia.  She has a history of heparin-induced thrombocytopenia.  She has a small frontal lobe subarachnoid hemorrhage.  There appears to be no brain metastases.  It is unclear if patient fell and this is trauma related or if it was non- aneurysmal and spontaneous.  We have been asked to consider IVC filter placement.  -Dr. Crissie Figures did review the chart and evaluate the patient.  At this time we would like to continue to monitor the superficial vein thrombosis clinically as well as with imaging.  If the clot is enlarging or extending into the deep vein system we will consider IVC filter placement at that time.  There is no evidence of pulmonary embolism on CT scan.  -Primary medical team will continue to evaluate for appropriate timing to initiate chemoprophylaxis.  There is no great data to help guide this decision but typically chemoprophylaxis can be  safely started 4 to 10 days after intracranial hemorrhage has been shown to be stable radiographically.  Continue with intermittent pneumatic compression.  This decision is further complicated by patient's chemotherapy induced pancytopenia and history of heparin-induced thrombocytopenia.  -Recommend weekly duplex ultrasound of the lower extremities particularly while patient is hospitalized and mobility is limited.        Subjective:     Brandy [name] is a 66 y.o. female patient with metastatic breast cancer on chemotherapy, previous colon cancer, heparin-induced thrombocytopenia, and bioprosthetic aortic valve for history of severe aortic stenosis.  She was admitted to the hospital with acute encephalopathy, septic shock, multiorgan failure.  She was found to have small left frontal subarachnoid hemorrhage.  Her blood cultures were initially positive for Staph aureus and her port was removed removed on September 30 and grew  Staph aureus as well.  Blood cultures have remained negative since September 29.  There is some clinical concern for possible infective endocarditis.  She continues on appropriate antibiotics.  Cardiology has been asked to perform transesophageal echocardiogram and consider IVC filter placement for right lower extremity superficial vein thrombosis and concern for extension into the deep vein system.    Overnight, patient did fair.  She continues on supplemental oxygen.  Her tube feeds were held since midnight.  She currently denies any chest pain or worsening shortness of breath.  She did have some abdominal cramping overnight.  She denies any right lower extremity pain.  She denies any fever or chills.    Scheduled Medications:   .  carvediloL  6.25 mg Oral BID with meals   . nystatin   Topical 2 times per day   . oxacillin 2 g in dextrose 5 % (D5W) 70 mL IVPB   Intravenous Q4H   . pantoprazole  40 mg Intravenous Daily   . polyvinyl alcohol-povidone(PF)  1 drop Both Eyes 4x Daily while awake    . potassium chloride in water  20 mEq Intravenous Once    Followed by   . potassium chloride in water  20 mEq Intravenous Once     Infusions:   PRN Medications: acetaminophen (TYLENOL) suppository, albuterol, alteplase (tPA), calcium carbonate, HYDROmorphone, lidocaine, lidocaine, LORazepam, magnesium hydroxide, naloxone (NARCAN) injection 0.04 mg, naloxone, ondansetron **OR** ondansetron, polyethylene glycol, senna-docusate, sodium chloride 0.9 %, sodium chloride 0.9 %, sodium chloride 0.9 %, sodium chloride 0.9 %, sodium chloride 0.9 %, sulfur hexafluoride    Objective:     Vital Signs:    Vital Sign Ranges for Last 24 Hours:  BP  Min: 101/64  Max: 120/83  Pulse  Min: 100  Max: 115  Resp  Min: 15  Max: 40  Temp  Min: 36.1 ?C (97 ?F)  Max: 36.6 ?C (97.8 ?F)  SpO2  Min: 91 %  Max: 100 %    Most Recent Vitals  BP: 111/66  Pulse: 108  Resp: 22  Temp: 36.3 ?C (97.4 ?F)  SpO2: 92 % (10/05 0700)    Intake/Ouput:    Intake/Output Summary (Last 24 hours) at 03/16/2022 1103  Last data filed at 03/16/2022 1030  Gross per 24 hour   Intake 3058 ml   Output 2425 ml   Net 633 ml        Exam:  General: alert and ill appearing, but non-toxic  Head: Normocephalic  Eyes: Conjunctiva not injected, EOMI, no discharge  Neck/Nodes: Supple, no cervical adenopathy  Chest: Auscultation: Coarse breath sounds throughout  Cardiac: Regular rate & rhythm, no murmurs  Abdomen: Soft without significant tenderness, disention, masses, organomegaly or guarding  Extremities: perfused with no pitting edema  Skin: Without rash or bruising  Skin is pale  Neurologic: Grossly non focal    Recent Labs:  Results for orders placed or performed during the hospital encounter of 03/07/22 (from the past 24 hour(s))   Hgb & Hct -Next Routine    Collection Time: 03/15/22  2:19 PM   Result Value Ref Range    Hemoglobin 9.0 (L) 11.7 - 16.5 g/dL    HCT 04.5 (L) 40.9 - 48.0 %   Comprehensive Metabolic Panel -Daily    Collection Time: 03/16/22  6:03 AM   Result Value  Ref Range    Sodium 139 135 - 145 mmol/L    Potassium 3.46 (L) 3.50 - 5.10 mmol/L    Chloride 104.2 101.0 - 111.0 mmol/L    CO2 - Carbon Dioxide 24 17 - 27 mmol/L    BUN 33 (H) 8 - 20 mg/dL    Creatinine 8.11 9.14 - 1.30 mg/dL    Glucose 782 74 - 956 mg/dL    Calcium 8.0 (L) 8.6 - 10.6 mg/dL    AST - Aspartate Aminotransferase 39 (H) 5 - 32 IU/L    ALT - Alanine Aminotransferase 24 5 - 33 IU/L    Alkaline Phosphatase 94 35 - 105 IU/L    Protein Total 5.9 (L) 6.4 - 8.2 g/dL    Albumin 2.2 (L) 3.5 - 5.0 g/dL    Bilirubin Total 2.13 0.10 - 1.70 mg/dL    Anion  Gap 10.8 9.0 - 18.0 mmol/L    Albumin/Globulin Ratio 0.6 (L) 1.2 - 2.2    Glomerular Filtration Rate Estimate (Female) 55 (L) >=60 mL/min/1.23m*2    GFR Additional Info      BUN / Creatinine Ratio 29.7 (H) 12.0 - 20.0    Osmolality Calculation 285.2 275.0 - 300.0 mOsm/kg   CBC with Auto Differential -Daily    Collection Time: 03/16/22  6:03 AM   Result Value Ref Range    WBC 1.9 (LCrit) 4.5 - 11.0 10*3/?L    RBC 2.74 (L) 3.80 - 5.60 10*6/?L    Hemoglobin 8.3 (L) 11.7 - 16.5 g/dL    HCT 16.1 (L) 09.6 - 48.0 %    MCV 89.2 81.0 - 100.0 fL    MCH 30.4 28.3 - 33.3 pg    MCHC 34.1 32.5 - 36.0 g/dL    RDW 04.5 (H) 40.9 - 16.1 %    Platelet Count 76 (L) 150 - 405 10*3/?L    MPV 8.4 6.9 - 10.0 fL   Phosphorus -Now & Q AM    Collection Time: 03/16/22  6:03 AM   Result Value Ref Range    Phosphorus 3.3 2.5 - 4.7 mg/dL   Magnesium -Now & Q AM    Collection Time: 03/16/22  6:03 AM   Result Value Ref Range    Magnesium 1.2 (L) 1.3 - 2.5 mg/dL   Cellavision Differential -    Collection Time: 03/16/22  6:03 AM   Result Value Ref Range    WBC 1.9 (LCrit) 4.5 - 11.0 10*3/?L    Neutrophils % 66 50 - 74 %    Lymphocytes % 25 20 - 40 %    Monocytes % 9 2 - 12 %    Eosinophils % 0 0 - 5 %    Basophils % 0 0 - 2 %    Neutrophils, Absolute 1.3 (L) 2.0 - 7.4 10*3/?L    Lymphocytes, Absolute 0.5 (L) 1.0 - 4.0 10*3/?L    Monocytes, Absolute 0.2 0.2 - 1.0 10*3/?L    Eosinophils, Absolute 0.0  0.0 - 0.4 10*3/?L    Basophils, Absolute 0.0 0.0 - 0.1 10*3/?L    Total Cells Counted 100     Platelet Estimate Decreased (A) Adequate    Anisocytosis 1+ (A) (none)    nRBC 1 0 /100     Pending Labs     Order Current Status    Blood Culture -x 2 sets Blood Blood Preliminary result    Blood Culture -x 2 sets Blood Blood Preliminary result    Blood Culture -x 2 sets Blood Blood Preliminary result    Blood Culture -x 2 sets Blood Blood Preliminary result    Blood Culture -x 2 sets Blood Blood Preliminary result    Blood Culture -x 2 sets Blood Blood Preliminary result        Recent Imaging:  No results found.      Adonis Housekeeper  03/16/2022  11:03 AM

## 2022-03-16 NOTE — Progress Notes (Signed)
Dressing at left chest x2 is cdi. Brandy [name]  Is at room changing dressing on IJ.

## 2022-03-16 NOTE — Pre Sedation (Signed)
Patient prepped for tunneled dual lumen picc line.

## 2022-03-16 NOTE — Assessment & Plan Note (Addendum)
Patient was admitted to the hospital with evidence of sepsis.  Blood cultures initially positive for Staph aureus.  Port was removed on September 30 and grew Staph aureus as well.  Blood cultures have since remained negative since September 29. Patient has history of aortic valve replacement.  No concerning findings on transthoracic echocardiogram.  There is some possibility of vascular phenomena.  -Proceed with planned transesophageal echocardiogram to assess for infective endocarditis.  This diagnosis is certainly possible.  -Primary medical team has been in contact with infectious disease at [facility] in [place].

## 2022-03-16 NOTE — Assessment & Plan Note (Signed)
Patient has what appears to be chronic superficial venous thrombosis.  There is some concern that this may bridge to the deep vein system.  Patient does have breast cancer on chemotherapy.  She has pancytopenia.  She has a history of heparin-induced thrombocytopenia.  She has a small frontal lobe subarachnoid hemorrhage.  There appears to be no brain metastases.  It is unclear if patient fell and this is trauma related or if it was non- aneurysmal and spontaneous.  We have been asked to consider IVC filter placement.  -Dr. Crissie Figures did review the chart and evaluate the patient.  At this time we would like to continue to monitor the superficial vein thrombosis clinically as well as with imaging.  If the clot is enlarging or extending into the deep vein system we will consider IVC filter placement at that time.  There is no evidence of pulmonary embolism on CT scan.  -Primary medical team will continue to evaluate for appropriate timing to initiate chemoprophylaxis.  There is no great data to help guide this decision but typically chemoprophylaxis can be safely started 4 to 10 days after intracranial hemorrhage has been shown to be stable radiographically.  Continue with intermittent pneumatic compression.  This decision is further complicated by patient's chemotherapy induced pancytopenia and history of heparin-induced thrombocytopenia.  -Recommend weekly duplex ultrasound of the lower extremities particularly while patient is hospitalized and mobility is limited.

## 2022-03-16 NOTE — Progress Notes (Signed)
Pt's NG tube dislodged after TEE procedure; tube removed at bedside by this Clinical research associate. MD and nutritionist aware; will replace as soon as possible.

## 2022-03-16 NOTE — Discharge Planning (AHS/AVS) (Addendum)
CM PROGRESS NOTE       Plan: TCU/SNF      General Overview: pt was discussed in MDR and pt will be having a TEE today.   -Pt is not medically stable for discharge today.  -This CM will continue to follow and assist with discharge needs/plnning.    [facility] CM.

## 2022-03-16 NOTE — Consults (Signed)
INPATIENT MEDICAL NUTRITION THERAPY     Brandy [name] is a 66 y.o. female, DOB Jun 23, 1955    Reason for assessment: TF  Diagnosis: septic shock  Pertinent medical history: CHF, colon cancer and metastatic breast cancer      NUTRITION ASSESSMENT    Anthropometric Measurements:   Ht: 160 cm (63)         Initial Wt: 103.4 kg (228 lb)   Updated Wt: 110.5 kg  IBW: 64 kg   %IBW: 172   BMI: 43.15  Nutrition Ht/Wt Status: morbid obesity   % wt loss: none, pt is currently fluid positive     Wt hx:   Wt Readings from Last 10 Encounters:   03/16/22 116 kg (255 lb 11.2 oz)   02/23/22 107.8 kg (237 lb 11.2 oz)   02/23/22 108.1 kg (238 lb 4.8 oz)   02/09/22 107.1 kg (236 lb 1.6 oz)   02/02/22 107.8 kg (237 lb 11.2 oz)   01/19/22 110.1 kg (242 lb 11.2 oz)   12/29/21 112.1 kg (247 lb 3.2 oz)   12/08/21 113.2 kg (249 lb 9.6 oz)   12/01/21 112.9 kg (249 lb)   11/23/21 111.2 kg (245 lb 3.2 oz)         Biochemical Data, Medical Tests, Procedures:  Lab Results   Component Value Date/Time    NA 139 03/16/2022 06:03 AM    K 3.46 (L) 03/16/2022 06:03 AM    CL 104.2 03/16/2022 06:03 AM    CO2 24 03/16/2022 06:03 AM    GLU 105 03/16/2022 06:03 AM    BUN 33 (H) 03/16/2022 06:03 AM    CREATININES 1.11 03/16/2022 06:03 AM    LABCREA 77 01/28/2016 06:36 PM    MG 1.2 (L) 03/16/2022 06:03 AM    PHOS 3.3 03/16/2022 06:03 AM    AST 39 (H) 03/16/2022 06:03 AM    ALT 24 03/16/2022 06:03 AM       Lab Results   Component Value Date/Time    HGB 8.3 (L) 03/16/2022 06:03 AM    HCT 24.5 (L) 03/16/2022 06:03 AM    MCV 89.2 03/16/2022 06:03 AM       Lab Results   Component Value Date/Time    CHOL 123 02/08/2018 02:06 PM    HDL 30 (L) 02/08/2018 02:06 PM    LDLCALC 55 02/08/2018 02:06 PM    TRIG 192.0 (H) 02/08/2018 02:06 PM       Lab Results   Component Value Date/Time    POCGLU 111 (H) 02/15/2016 11:12 AM    POCGLU 118 (H) 02/10/2016 07:44 PM    POCGLU 91 02/10/2016 04:05 PM    POCGLU 162 (H) 02/10/2016 12:17 PM    POCGLU 102 (H) 02/10/2016 07:57 AM     GLYCOA1C 5.5 07/27/2020 04:32 PM       GFR Estimate   Date Value Ref Range Status   02/24/2016 >60 >=60 mL/min/1.12m*2 Final   02/21/2016 >60 >=60 mL/min/1.35m*2 Final   02/18/2016 >60 >=60 mL/min/1.45m*2 Final     Glomerular Filtration Rate Estimate   Date Value Ref Range Status   03/06/2016 >60.0 >=60.0 mL/min/1.47m*2 Final   01/23/2016 >60.0 >=60.0 mL/min/1.8m*2 Final   01/23/2016 >60.0 >=60.0 mL/min/1.70m*2 Final     Glomerular Filtration Rate Estimate (Female)   Date Value Ref Range Status   02/28/2021 79 >=60 mL/min/1.14m*2 Final   09/08/2020 83 >=60 mL/min/1.26m*2 Final   07/27/2020 87 >=60 mL/min/1.85m*2 Final   10/04/2018 >60.0 >=60.0 mL/min/1.67m*2 Final  06/25/2018 59.4 (L) >=60.0 mL/min/1.5m*2 Final   04/05/2018 >60.0 >=60.0 mL/min/1.62m*2 Final        Current scheduled meds include:   . carvediloL  6.25 mg Oral BID with meals   . nystatin   Topical 2 times per day   . oxacillin 2 g in dextrose 5 % (D5W) 70 mL IVPB   Intravenous Q4H   . pantoprazole  40 mg Intravenous Daily   . polyvinyl alcohol-povidone(PF)  1 drop Both Eyes 4x Daily while awake   . potassium chloride in water  20 mEq Intravenous Once    Followed by   . potassium chloride in water  20 mEq Intravenous Once         PRN Meds include:  acetaminophen (TYLENOL) suppository, albuterol, alteplase (tPA), calcium carbonate, HYDROmorphone, lidocaine, lidocaine, LORazepam, magnesium hydroxide, naloxone (NARCAN) injection 0.04 mg, naloxone, ondansetron **OR** ondansetron, polyethylene glycol, senna-docusate, sodium chloride 0.9 %, sodium chloride 0.9 %, sodium chloride 0.9 %, sodium chloride 0.9 %, sodium chloride 0.9 %, sulfur hexafluoride    Food allergies: none  Other Allergies: Bee sting [allergen ext-venom-honey bee], Heparin, and Sulfa (sulfonamide antibiotics)    Food/Nutrition-Related History:  Diet Orders:   Procedures   . Diet Consistency: NPO         Date of Last Bowel movement: 10/2    Skin Concerns: edema noted on all  extremities    Estimated Needs  Protein: 85 g (1.3 g/kg)  Energy: 1800 kcal (28 kcal/kg)  Fluid: 1800 mL (1 mL/kcal)    PO Intake: NPO    Pt continues on TF. During MDR long term EN brought up. Discussed with Dr Shelva Majestic, adjusted pt to bolus feeds. Dr Shelva Majestic will discuss with pt/ family likely tomorrow about possible PEG tube. Per RN, pt has been feeling gas pains, unclear if TF related. Note Osmolite 1.2 is a fiber free formula, will consider fiber supplement.   Pt was NPO for procedure earlier today. Called RN to discuss new bolus order, per RN NGT was dislodged during procedure and needs to be repositioned.          EDUCATION  ? Education topics/handouts provided: none this visit  ? Barriers to learning: n/a  ? Comprehension: n/a  ? Receptivity: n/a  ? Expected compliance: n/a    NUTRITION DIAGNOSIS  Inadequate oral intake related to septic shock, dysphagia as evidenced by inability to safely swallow, need for TF.     These characteristics are NOT indicative of malnutrition at this time. However, pt at risk for malnutrition pending trend of mild characteristics.     NUTRITION INTERVENTION  TF: osmolite 1.2 bolus of 378 mL 4 times per day. This to provide 1800 kcal, 84 g pro, 1240 mL free water  Water flushes: 50 mL pre/post bolus    MONITORING & EVALUATION  Wts, diet advancement, nutrition related labs, POC     Follow Up: 1-3 days    Pearson Forster, RD, LD  03/16/2022  2:11 PM  [facility]  Available on [place] or [phone]

## 2022-03-17 LAB — CELLAVISION DIFFERENTIAL
Basophils %: 1 % (ref 0–2)
Basophils, Absolute: 0 10*3/ÂµL (ref 0.0–0.1)
Eosinophils %: 3 % (ref 0–5)
Eosinophils, Absolute: 0.1 10*3/ÂµL (ref 0.0–0.4)
Lymphocytes %: 23 % (ref 20–40)
Lymphocytes, Absolute: 0.4 10*3/ÂµL — ABNORMAL LOW (ref 1.0–4.0)
Monocytes %: 6 % (ref 2–12)
Monocytes, Absolute: 0.1 10*3/ÂµL — ABNORMAL LOW (ref 0.2–1.0)
Neutrophils %: 67 % (ref 50–74)
Neutrophils, Absolute: 1.1 10*3/ÂµL — ABNORMAL LOW (ref 2.0–7.4)
Platelet Estimate: DECREASED — AB
Total Cells Counted: 100
WBC: 1.7 10*3/ÂµL — ABNORMAL LOW (ref 4.5–11.0)

## 2022-03-17 LAB — COMPREHENSIVE METABOLIC PANEL
ALT - Alanine Aminotransferase: 21 IU/L (ref 5–33)
AST - Aspartate Aminotransferase: 32 IU/L (ref 5–32)
Albumin/Globulin Ratio: 0.6 — ABNORMAL LOW (ref 1.2–2.2)
Albumin: 2.4 g/dL — ABNORMAL LOW (ref 3.5–5.0)
Alkaline Phosphatase: 88 IU/L (ref 35–105)
Anion Gap: 12.2 mmol/L (ref 9.0–18.0)
BUN / Creatinine Ratio: 29.8 — ABNORMAL HIGH (ref 12.0–20.0)
BUN: 31 mg/dL — ABNORMAL HIGH (ref 8–20)
Bilirubin Total: 0.6 mg/dL (ref 0.10–1.70)
CO2 - Carbon Dioxide: 23 mmol/L (ref 17–27)
Calcium: 8.2 mg/dL — ABNORMAL LOW (ref 8.6–10.6)
Chloride: 106.8 mmol/L (ref 101.0–111.0)
Creatinine: 1.04 mg/dL (ref 0.60–1.30)
Glomerular Filtration Rate Estimate (Female): 59 mL/min/{1.73_m2} — ABNORMAL LOW (ref >=60)
Glucose: 162 mg/dL — ABNORMAL HIGH (ref 74–106)
Osmolality Calculation: 293.2 mOsm/kg (ref 275.0–300.0)
Potassium: 3.85 mmol/L (ref 3.50–5.10)
Protein Total: 6.3 g/dL — ABNORMAL LOW (ref 6.4–8.2)
Sodium: 142 mmol/L (ref 135–145)

## 2022-03-17 LAB — CBC WITH AUTO DIFFERENTIAL
HCT: 24.4 % — ABNORMAL LOW (ref 35.0–48.0)
Hemoglobin: 8.1 g/dL — ABNORMAL LOW (ref 11.7–16.5)
MCH: 30.1 pg (ref 28.3–33.3)
MCHC: 33.1 g/dL (ref 32.5–36.0)
MCV: 91.1 fL (ref 81.0–100.0)
MPV: 9 fL (ref 6.9–10.0)
Platelet Count: 90 10*3/ÂµL — ABNORMAL LOW (ref 150–405)
RBC: 2.67 10*6/ÂµL — ABNORMAL LOW (ref 3.80–5.60)
RDW: 19.3 % — ABNORMAL HIGH (ref 11.7–16.1)
WBC: 1.7 10*3/ÂµL — ABNORMAL LOW (ref 4.5–11.0)

## 2022-03-17 LAB — C-REACTIVE PROTEIN (SLM): C-Reactive Protein: 153 mg/L — ABNORMAL HIGH (ref <5.0)

## 2022-03-17 LAB — MAGNESIUM: Magnesium: 1.2 mg/dL — ABNORMAL LOW (ref 1.3–2.5)

## 2022-03-17 LAB — PHOSPHORUS: Phosphorus: 3.5 mg/dL (ref 2.5–4.7)

## 2022-03-17 MED ORDER — magnesium sulfate in water IVPB premix 2 g
2 | Freq: Once | INTRAVENOUS | Status: AC
Start: 2022-03-17 — End: 2022-03-17
  Administered 2022-03-17: 18:00:00 2504 g via INTRAVENOUS
  Administered 2022-03-17: 16:00:00 2 g via INTRAVENOUS

## 2022-03-17 MED ORDER — diclofenac (VOLTAREN) 1 % topical gel
1 | Freq: Two times a day (BID) | TOPICAL | Status: DC
Start: 2022-03-17 — End: 2022-03-22
  Administered 2022-03-17 – 2022-03-22 (×11): 1 % via TOPICAL

## 2022-03-17 MED ORDER — magnesium sulfate linked panel request (order to be replaced with magnesium sulfate linked panel) IVPB 3 g
Freq: Once | Status: DC
Start: 2022-03-17 — End: 2022-03-17

## 2022-03-17 MED ORDER — magnesium sulfate in dextrose 5 % IVPB premix 1 g
1 | Freq: Once | INTRAVENOUS | Status: AC
Start: 2022-03-17 — End: 2022-03-17
  Administered 2022-03-17: 19:00:00 1100 g via INTRAVENOUS
  Administered 2022-03-17: 18:00:00 1 g via INTRAVENOUS

## 2022-03-17 NOTE — Progress Notes (Signed)
Hospitalist Progress Note    Subjective:  Slept well. Reports R knee has been bothering her more last couple days       Objective:   BP 108/63 (BP Location: Left forearm, Patient Position: Lying)   Pulse 110   Temp 36.4 ?C (97.6 ?F) (Temporal)   Resp (!) 40   Ht 5' 3 (1.6 m)   Wt 251 lb 4.8 oz (114 kg)   LMP  (LMP Unknown)   SpO2 94%   BMI 44.52 kg/m?    General: obese female laying in bed, weak/hoarse voice   Cardiac: RRR, II/VI systolic murmur  Pulm: CTAB   Ext: splinter hemorrhages and Janeway lesions present     Assessment:   Active Hospital Problems    Diagnosis SNOMED CT(R) Date Noted   . Infection associated with central venous catheter INFECTION OF VASCULAR CATHETER 03/16/2022   . 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 Subarachnoid hemorrhage (HCC) HEMORRHAGE INTO SUBARACHNOID SPACE OF NEURAXIS 03/07/2022   . Acute encephalopathy DISORDER OF BRAIN 03/07/2022   . Septic shock (HCC) SEPTIC SHOCK 03/07/2022   . Acute pyelonephritis ACUTE PYELONEPHRITIS 03/07/2022   . Acute renal failure, unspecified acute renal failure type (HCC) ACUTE KIDNEY INJURY 03/07/2022   . SAH (subarachnoid hemorrhage) (HCC) HEMORRHAGE INTO SUBARACHNOID SPACE OF NEURAXIS 03/07/2022   . Encephalopathy acute DISORDER OF BRAIN 03/07/2022   . 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 Liver lesion- small hypodense lesion around falciform lig LESION OF LIVER 01/21/2016       Plan:   #MSSA Bacteremia: Port removed. On appropriate abx. CRP improving. Low threshold for additional imaging. Continues to be afebrile and repeat BCx w/ NGTD. TEE 10/5 with vegetations (see endocarditis problem) IR attempted tunnelled catheter and then Satira Anis attempted PICC placement, both of which failed. Satira Anis to reach out to IR and see if they can do an over the wire catheter with tunneling.   #Endocarditis (new as  of 10/5): TEE with small vegetations. D/w Dr.Kuchinsky and no indication for valve replacement or surgical consultation. Continue with oxacillin per MSSA Bacteremia problem.   #RLE saphenous DVT reaching to junction with larger vein.: High DVT risk with limited motion. Cancer. CTA Pulm negative for PE on 10/4. Discussed with Dr. Harlen Labs on 10/5 who recommended observation of the saphenous vein DVT with venous duplex in 4-5 days (10/9-10/10). No IVC filter at this time. 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   #On going hypoxia: Multifactorial 2/2 CAP (treated), poor inspiratory effort. CTA Pulm neg for PE.  #HFrEF: on carvedilol. Continue medication optimization  #Free water deficit: decreasing free water flushes. Will discuss with dietary to routine maintenance.  #Trace?Non-Aneurysmal?Subarachnoid Hemorrhage: stable on repeat imaging. Contraindicated for anticoagulation.  #Pancytopenia in setting of chemotherapy and sespsis. On neutropenic precautions.      #Nutrition: NGT feeds switched to bolus today. May need to consider PEGT, but will discuss with SLP first.     Code: DNR/DNI, no code  Dispo:PCU     [address]

## 2022-03-17 NOTE — Treatment Summary (Signed)
Inpatient Speech Pathology - Daily Note  Dysphagia    [name] / [id number] DOB:  [birth year] / 66 y.o. Date:  03/17/2022     Start/Stop Time: 1020-1050                                            SUBJECTIVE   Patient was agreeable to participate with skilled swallowing therapy.      Patient reported: very happy when given ice chips and water. Mouth feels very dry.    RN staff reported: pt has been complaining of some stomach pain. Ice chips have been going well.    Pain:  None reported by patient    OBJECTIVE      Skilled SLP services for dysphagia management provided. Services included: swallowing re-evaluation, instruction and training in compensatory strategies and patient/caregiver education    SLP performed oral care (suction peroxide kit) pior to PO trials and applied oral moisturizer gel to lips at conclusion of session.    Mental status  Alert, cooperative, followed commands consistently and seemed to retain instruction given      Respiratory Status: abnormal respiratory rate    O2 Status:  oxymizer and Oxymask prior to ST    Items presented: ice chips, thin liquid and applesauce     Oral Phase: prolonged oral transit, tongue pumping, pt reported slight oral residue of applesauce after the swallow 1x, but none observed by SLP     Pharyngeal Phase: multiple swallows per bolus    Signs/Symptoms Penetration/Aspiration were: none    Esophageal Signs/Symptoms: None    Training for Compensatory Strategies- Strategies instructed during PO trials included: small sips/bites, pacing, avoiding chugging of liquids and swallowing multiple times per bolus; avoiding talking while eating and drinking to avoid increased shortness of breath     Compensatory Strategy Performance: patient required min cueing for adequate return demonstration of strategies    Education: Instructed patient/family regarding results/recommendations, instructed RN regarding results/recommendations, compensatory swallowing strategies,  aspiration pneumonia prevention, oral hygiene, options for nutrition (including alternative nutrition), team input was provided in nutrition/diet decision making for pt and input provided by following team member: patient    ASSESSMENT     Clinical Impressions:  mild-moderate oropharyngeal dysphagia, with prolonged and reduced oral transit with applesauce, but no obvious oral residue post swallow.  Multiple swallows per bolus may indicate oral and/or pharyngeal residue post swallow.  (Pt typically swallowed 2x per bolus.)  Pt demonstrated rapid respiratory rate, which increased slightly after apneic breath holds during swallowing. She benefited from a sufficient pause after each bite/sip to allow breathing to slow slightly again.  Additionally,pt demonstrated increased respiratory rate after talking. Recommend no talking while eating/drinking, and pt stated understanding and agreement.  Overall, pt tolerated applesauce and thin water via straw well, without any overt s/s aspiration. While she is at risk of aspiration related to rapid respiratory rate/discoordination of breathing and swallowing, pt demonstrated adequate breath hold during swallow and exhale immediately after swallows.     With comparison to previous session, patient demonstrates: improving swallow function/safety and improving use of compensatory strategies.      Patient's current deficits in: swallow function and coordination of respiration and swallowing have the potential to cause/contribute to: increased risk of aspiration or choking, inadequate nutrition/hydration, decrease in overall health and well-being and decreased quality of life. Skilled dysphagia management was  provided to: maximize patient's swallow safety, maximize patient's ability to participate with swallowing/meals, maximize patient's ability to achieve optimal nutrition/hydration and maximize comfort/quality of life for patient though PO intake    Recommendations:   Diet  recommendations:  Puree (IDDSI 4) , Thin liquids (IDDSI 0), 6 small meals. (Note: suspect this may be the least restrictive diet for pt, unless her breathing significantly improves)    Compensatory Strategies: Excellent/frequent oral hygiene , small sips, small bites, avoid chugging of liquids, eat and drink carefully and slowly, swallow multiple times per bolus, use straws, one to one assist during meals,, monitor temperature and lung sounds, hold PO if RR is 30 breaths/min or higher, no talking while eating/drinking    Medications: crush and give in puree     Additional diagnostic/treatment considerations: none at this time     Consult recommendations  Dietitian for further management of nutrition.  (Pt may benefit from continued NG tube feeds for a period of time to supplement PO intake.)     PLAN   Next session will focus on: assessment of diet tolerance, assessment of readiness to upgrade diet, compensatory strategy training, education for patient/family and/or RN    Goal 1:  Patient will tolerate pureed (IDDSI 4) diet textures and thin (IDDSI 0) liquids with swallow WFL for safe and adequate nutrition/hydration.    Discharge recommendations: TCU     Treatment Plan Frequency   Speech/Language/Cognitive therapy to include: swallow assessments and treatment as indicated, assessment of diet tolerance and readiness to upgrade diet, monitoring of swallow safety, compensatory strategy training and patient/family/RN education  3x per week during Inpatient stay.  Frequency will be modified as patients condition indicates.    Change in frequency on POC:Yes  Rationale for reduced session frequency: patient likely at least restrictive diet and anticipate slow advancement of diet/progression of skills     Brandy [name] will remain on the Speech Pathology schedule until goals are met, or there has been a change in the Plan of Care.      Speech Language Pathologist: Wyatt Haste, CCC-SLP

## 2022-03-17 NOTE — Consults (Signed)
INPATIENT MEDICAL NUTRITION THERAPY     Brandy [name] is a 66 y.o. female, DOB 1955-11-07    Reason for assessment: TF  Diagnosis: septic shock  Pertinent medical history: CHF, colon cancer and metastatic breast cancer      NUTRITION ASSESSMENT    Anthropometric Measurements:   Ht: 160 cm (63)         Initial Wt: 103.4 kg (228 lb)   Updated Wt: 110.5 kg  IBW: 64 kg   %IBW: 172   BMI: 43.15  Nutrition Ht/Wt Status: morbid obesity   % wt loss: none, pt is currently fluid positive     Wt hx:   Wt Readings from Last 10 Encounters:   03/17/22 114 kg (251 lb 4.8 oz)   02/23/22 107.8 kg (237 lb 11.2 oz)   02/23/22 108.1 kg (238 lb 4.8 oz)   02/09/22 107.1 kg (236 lb 1.6 oz)   02/02/22 107.8 kg (237 lb 11.2 oz)   01/19/22 110.1 kg (242 lb 11.2 oz)   12/29/21 112.1 kg (247 lb 3.2 oz)   12/08/21 113.2 kg (249 lb 9.6 oz)   12/01/21 112.9 kg (249 lb)   11/23/21 111.2 kg (245 lb 3.2 oz)         Biochemical Data, Medical Tests, Procedures:  Lab Results   Component Value Date/Time    NA 142 03/17/2022 06:15 AM    K 3.85 03/17/2022 06:15 AM    CL 106.8 03/17/2022 06:15 AM    CO2 23 03/17/2022 06:15 AM    GLU 162 (H) 03/17/2022 06:15 AM    BUN 31 (H) 03/17/2022 06:15 AM    CREATININES 1.04 03/17/2022 06:15 AM    LABCREA 77 01/28/2016 06:36 PM    MG 1.2 (L) 03/17/2022 06:15 AM    PHOS 3.5 03/17/2022 06:15 AM    AST 32 03/17/2022 06:15 AM    ALT 21 03/17/2022 06:15 AM       Lab Results   Component Value Date/Time    HGB 8.1 (L) 03/17/2022 06:15 AM    HCT 24.4 (L) 03/17/2022 06:15 AM    MCV 91.1 03/17/2022 06:15 AM       Lab Results   Component Value Date/Time    CHOL 123 02/08/2018 02:06 PM    HDL 30 (L) 02/08/2018 02:06 PM    LDLCALC 55 02/08/2018 02:06 PM    TRIG 192.0 (H) 02/08/2018 02:06 PM       Lab Results   Component Value Date/Time    POCGLU 111 (H) 02/15/2016 11:12 AM    POCGLU 118 (H) 02/10/2016 07:44 PM    POCGLU 91 02/10/2016 04:05 PM    POCGLU 162 (H) 02/10/2016 12:17 PM    POCGLU 102 (H) 02/10/2016 07:57 AM     GLYCOA1C 5.5 07/27/2020 04:32 PM       GFR Estimate   Date Value Ref Range Status   02/24/2016 >60 >=60 mL/min/1.78m*2 Final   02/21/2016 >60 >=60 mL/min/1.6m*2 Final   02/18/2016 >60 >=60 mL/min/1.46m*2 Final     Glomerular Filtration Rate Estimate   Date Value Ref Range Status   03/06/2016 >60.0 >=60.0 mL/min/1.78m*2 Final   01/23/2016 >60.0 >=60.0 mL/min/1.2m*2 Final   01/23/2016 >60.0 >=60.0 mL/min/1.69m*2 Final     Glomerular Filtration Rate Estimate (Female)   Date Value Ref Range Status   02/28/2021 79 >=60 mL/min/1.73m*2 Final   09/08/2020 83 >=60 mL/min/1.42m*2 Final   07/27/2020 87 >=60 mL/min/1.62m*2 Final   10/04/2018 >60.0 >=60.0 mL/min/1.21m*2 Final   06/25/2018  59.4 (L) >=60.0 mL/min/1.92m*2 Final   04/05/2018 >60.0 >=60.0 mL/min/1.73m*2 Final        Current scheduled meds include:   . carvediloL  6.25 mg Oral BID with meals   . diclofenac   Topical BID   . nystatin   Topical 2 times per day   . oxacillin 2 g in dextrose 5 % (D5W) 70 mL IVPB   Intravenous Q4H   . pantoprazole  40 mg Intravenous Daily   . polyvinyl alcohol-povidone(PF)  1 drop Both Eyes 4x Daily while awake         PRN Meds include:  acetaminophen (TYLENOL) suppository, albuterol, alteplase (tPA), calcium carbonate, HYDROmorphone, lidocaine, lidocaine, LORazepam, magnesium hydroxide, naloxone (NARCAN) injection 0.04 mg, naloxone, ondansetron **OR** ondansetron, polyethylene glycol, senna-docusate, sodium chloride 0.9 %, sodium chloride 0.9 %, sodium chloride 0.9 %, sodium chloride 0.9 %, sodium chloride 0.9 %, sulfur hexafluoride    Food allergies: none  Other Allergies: Bee sting [allergen ext-venom-honey bee], Heparin, and Sulfa (sulfonamide antibiotics)    Food/Nutrition-Related History:  Diet Orders:   Procedures   . Diet Consistency: Dysphagia; Texture: Pureed (IDDSI 4); Type: Regular; Level of Liquid Thickness: Thin (IDDSI 0); Paper Tray: Enteric Tray         Date of Last Bowel movement: 10/4    Skin Concerns: edema noted on  all extremities    Estimated Needs  Protein: 85 g (1.3 g/kg)  Energy: 1800 kcal (28 kcal/kg)  Fluid: 1800 mL (1 mL/kcal)    PO Intake: no documented intake    Pt continues on TF. Adjusted to bolus feeds yesterday, per RN appears to be tolerating well. Pt advanced to puree diet, but remains weak and likely will not meet nutrition needs via oral intake for a few days. Discussed with pt that as her oral intake improves, we will be able to take the NGT out. Pt interested in milkshake, ordered BID.   Adjusted TF order to noc feeds to promote oral intake during the daily. TF will continue to meet 100% nutrition needs. If remains on TF 10/9, will decrease rate to meet 75% est nutrition needs to promote oral intake.            EDUCATION  ? Education topics/handouts provided: none this visit  ? Barriers to learning: n/a  ? Comprehension: n/a  ? Receptivity: n/a  ? Expected compliance: n/a    NUTRITION DIAGNOSIS  Inadequate oral intake related to septic shock, dysphagia as evidenced by inability to safely swallow, need for TF.     These characteristics are NOT indicative of malnutrition at this time. However, pt at risk for malnutrition pending trend of mild characteristics.     NUTRITION INTERVENTION  TF: osmolite 1.2 cyclice feeds 2000-0800 @ 126 mL/hr. This to provide 1800 kcal, 84 g pro, 1240 mL free water  Water flushes: 100 mL q 4 hrs    MONITORING & EVALUATION  Wts, diet advancement, nutrition related labs, POC     Follow Up: 1-3 days    Pearson Forster, RD, LD  03/17/2022  2:57 PM  [facility]  Available on [place] or [phone]

## 2022-03-17 NOTE — Discharge Planning (AHS/AVS) (Addendum)
CM PROGRESS NOTE       Plan: TCU/SNF    General Overview: this CM spoke with pt's husband [name] and pt is still receiving chemotherapy and [facility] [place] [facility]   [facility] currently [facility].  -Pt will [facility] waitlist.    -03/17/22 11:11 AM pt was discussed in MDR and pt is not medically stable for discharge.  -This CM will continue to follow and assist with discharge needs/planning.    Bonne Dolores CM

## 2022-03-17 NOTE — Plan of Care (Signed)
Pt ngt placement confirmed via xray, tf cont and advanced per order with no complications noted, incontinent of bowel x 1, adequate urine output in foley, dilaudid x 2 doses given, skin breakdown improved from prior shift, abx cont and RIJ patent      Problem: Safety  Goal: Patient will be injury free during hospitalization  Description: Assess and monitor vitals signs, neurological status including level of consciousness and orientation. Assess patient's risk for falls and implement fall prevention plan of care and interventions per hospital policy.      Ensure arm band on, uncluttered walking paths in room, adequate room lighting, call light and overbed table within reach, bed in low position, wheels locked, side rails up per policy, and non-skid footwear provided.   Outcome: Progressing     Problem: Knowledge Deficit  Goal: Patient/family/caregiver demonstrates understanding of disease process, treatment plan, medications, and discharge instructions  Description: Complete learning assessment and assess knowledge base.  Outcome: Progressing     Problem: Risk for Falls  Goal: No falls during hospitalization  Description: Patient will not fall during hospitalization.  Outcome: Progressing     Problem: Knowledge Deficit  Goal: Knowledge - personal safety  Description: Patient will verbalize understanding of fall prevention.  Outcome: Progressing     Problem: Pain  Goal: Patient will achieve activity goals and show improvement in function, mood, and coping  Outcome: Progressing  Goal: Pain level within patient's desired comfort-function goal  Outcome: Progressing  Goal: Patient will verbalize understanding and be an active participant in the pain management care plan  Outcome: Progressing  Goal: Patient will be able to identify strategies to reduce anxiety and improve coping  Outcome: Progressing     Problem: [place] Braden Scale Score 18 & Lower  Goal: For scores 18 & lower:  Description: For scores 18 &  lower:  Outcome: Progressing     Problem: Insufficient Fluid Volume  Goal: Fluid and electrolyte balance are achieved/maintained  Description: Assess and monitor vital signs (orthostatic vitals if applicable), fluid intake and output, urine color, labs, skin turgor, mucous membranes, mental status, and gastrointestinal system for nausea, vomiting and diarrhea.  Monitor for signs and symptoms of hypovolemia (tachycardia, rapid breathing, decreased urine output, postural hypotension, confusion, syncope).  Collaborate with interdisciplinary team and initiate plan and interventions as ordered.  Outcome: Progressing     Problem: Hemodynamic Status  Goal: Patient's vitals signs are stable  Description: Assess and monitor patient's heart rate, rhythm, respiratory rate, peripheral pulses, capillary refill, color, body temperature, intake and output, labs and physical activity tolerance.   Observe for signs of chest pain (note location, duration, severity, radiation and associated symptoms such as diaphoresis, nausea, indigestion).  Monitor for signs and symptoms of heart failure (eg. shortness of breath, edema of feet/ankles/legs, rapid irregular heart rate, coughing, wheezing, white/pink blood tinged sputum, sudden weight gain, chest pain). Collaborate with interdisciplinary team and initiate plan and interventions as ordered.  Outcome: Progressing     Problem: Insufficient Nutritional Intake  Goal: Patient's nutritional intake is adequate  Description: Assess and monitor food intake and supplements, patient food preferences, nausea, vomiting, labs, oral cavity (gums, teeth, tongue, mucosa), proper denture fit, and cultural beliefs.  Monitor for signs of hypoglycemia and hyperglycemia.  Collaborate with interdisciplinary team and initiate plan and interventions as ordered.  Outcome: Progressing     Problem: Inadequate Mobility/Activity Status  Goal: Mobility/activity is maintained at optimum level for patient  Description:  Assess and monitor patient  barriers  to mobility and need for assistive/adaptive devices. Assess patient's emotional response to limitations. Collaborate with interdisciplinary team and initiate plans and interventions as ordered.  Outcome: Progressing     Problem: Infection  Goal: Signs and symptoms of infections are decreased or avoided  Description: Assess and monitor patient for signs and symptoms of infection such as redness, warmth, discharge, and increased body temperature. Monitor and report abnormal lab values (ex-CBC and diff, serum protein, serum albumin, and cultures).  Wash hands properly before and after each patient care activity. Utilize standard precautions and use personal protective equipment (PPE) as indicated. Ensure aseptic care of all intravenous lines and invasive tubes/drains. Obtain immunization and exposure to communicable diseases history. Collaborate with interdisciplinary team and initiate plan and interventions as ordered.  Outcome: Progressing     Problem: Compromised Skin Integrity  Goal: Skin integrity is maintained or improved  Outcome: Progressing     Problem: Potential for Infection  Goal: Remains infection free  Outcome: Progressing     Problem: Risk for Compromised Skin Integrity  Goal: Skin integrity is maintained or improved  Outcome: Progressing     Problem: Risk for Pressure, Friction, and Shear  Goal: Reduction of pressure, friction, and shear  Outcome: Progressing     Problem: Risk for Decreased Activity  Goal: Repositioning and early mobility  Outcome: Progressing     Problem: Risk for Excessive Moisture  Goal: Skin care and moisture management  Outcome: Progressing     Problem: Risk for Insufficient Nutritional Intake  Goal: Adequate nutrition status  Outcome: Progressing     Problem: Metabolic/Fluid and Electrolytes  Goal: Electrolytes maintained within normal limits  Description: INTERVENTIONS:  1. Monitor labs and assess patient for signs and symptoms of electrolyte  imbalances  2. Administer electrolyte replacement as ordered  3. Monitor response to electrolyte replacements, including repeat lab results as appropriate  4. Fluid restriction as ordered  5. Instruct patient on fluid and nutrition restrictions as appropriate  Outcome: Progressing  Goal: Hemodynamic stability and optimal renal function maintained  Description: INTERVENTIONS:  1. Monitor labs and assess for signs and symptoms of volume excess or deficit  2. Monitor intake, output and patient weight  3. Monitor urine specific gravity, serum osmolarity and serum sodium as indicated or ordered  4. Monitor response to interventions for patient's volume status, including labs, urine output, blood pressure (other measures as available)  5. Encourage oral intake as appropriate  6. Instruct patient on fluid and nutrition restrictions as appropriate  Outcome: Progressing

## 2022-03-18 LAB — CELLAVISION DIFFERENTIAL
Bands %: 2 % (ref 0–2)
Bands, Absolute: 0 10*3/ÂµL
Basophils %: 0 % (ref 0–2)
Basophils, Absolute: 0 10*3/ÂµL (ref 0.0–0.1)
Eosinophils %: 0 % (ref 0–5)
Eosinophils, Absolute: 0 10*3/ÂµL (ref 0.0–0.4)
Lymphocytes %: 22 % (ref 20–40)
Lymphocytes, Absolute: 0.4 10*3/ÂµL — ABNORMAL LOW (ref 1.0–4.0)
Monocytes %: 5 % (ref 2–12)
Monocytes, Absolute: 0.1 10*3/ÂµL — ABNORMAL LOW (ref 0.2–1.0)
Myelocytes %: 1 %
Myelocytes, Absolute: 0 10*3/ÂµL
Neutrophils %: 70 % (ref 50–74)
Neutrophils, Absolute: 1.2 10*3/ÂµL — ABNORMAL LOW (ref 2.0–7.4)
Platelet Estimate: DECREASED — AB
Total Cells Counted: 100
WBC: 1.7 10*3/ÂµL — ABNORMAL LOW (ref 4.5–11.0)

## 2022-03-18 LAB — CBC WITH AUTO DIFFERENTIAL
HCT: 21.2 % — ABNORMAL LOW (ref 35.0–48.0)
Hemoglobin: 7.1 g/dL — ABNORMAL LOW (ref 11.7–16.5)
MCH: 30.2 pg (ref 28.3–33.3)
MCHC: 33.4 g/dL (ref 32.5–36.0)
MCV: 90.4 fL (ref 81.0–100.0)
MPV: 8.9 fL (ref 6.9–10.0)
Platelet Count: 98 10*3/ÂµL — ABNORMAL LOW (ref 150–405)
RBC: 2.35 10*6/ÂµL — ABNORMAL LOW (ref 3.80–5.60)
RDW: 19 % — ABNORMAL HIGH (ref 11.7–16.1)
WBC: 1.7 10*3/ÂµL — ABNORMAL LOW (ref 4.5–11.0)

## 2022-03-18 LAB — COMPREHENSIVE METABOLIC PANEL
ALT - Alanine Aminotransferase: 18 IU/L (ref 5–33)
AST - Aspartate Aminotransferase: 26 IU/L (ref 5–32)
Albumin/Globulin Ratio: 0.6 — ABNORMAL LOW (ref 1.2–2.2)
Albumin: 2.2 g/dL — ABNORMAL LOW (ref 3.5–5.0)
Alkaline Phosphatase: 81 IU/L (ref 35–105)
Anion Gap: 10.4 mmol/L (ref 9.0–18.0)
BUN / Creatinine Ratio: 33 — ABNORMAL HIGH (ref 12.0–20.0)
BUN: 33 mg/dL — ABNORMAL HIGH (ref 8–20)
Bilirubin Total: 0.4 mg/dL (ref 0.10–1.70)
CO2 - Carbon Dioxide: 23 mmol/L (ref 17–27)
Calcium: 7.9 mg/dL — ABNORMAL LOW (ref 8.6–10.6)
Chloride: 103.6 mmol/L (ref 101.0–111.0)
Creatinine: 1 mg/dL (ref 0.60–1.30)
Glomerular Filtration Rate Estimate (Female): 62 mL/min/{1.73_m2} (ref >=60)
Glucose: 191 mg/dL — ABNORMAL HIGH (ref 74–106)
Osmolality Calculation: 286.2 mOsm/kg (ref 275.0–300.0)
Potassium: 3.9 mmol/L (ref 3.50–5.10)
Protein Total: 6.1 g/dL — ABNORMAL LOW (ref 6.4–8.2)
Sodium: 137 mmol/L (ref 135–145)

## 2022-03-18 LAB — MAGNESIUM: Magnesium: 1.5 mg/dL (ref 1.3–2.5)

## 2022-03-18 LAB — ABO/RH (HCLL): Rh (D): POSITIVE

## 2022-03-18 LAB — ANTIBODY SCREEN (HCLL): Antibody Screen: NEGATIVE

## 2022-03-18 LAB — PHOSPHORUS: Phosphorus: 3.4 mg/dL (ref 2.5–4.7)

## 2022-03-18 MED ORDER — sodium chloride 0.9 % (NS) for medication or blood product administration 250 mL
0.9 | INTRAVENOUS | Status: DC | PRN
Start: 2022-03-18 — End: 2022-03-19

## 2022-03-18 MED ORDER — acetaminophen (TYLENOL) tablet 650 mg
325 | ORAL | Status: DC | PRN
Start: 2022-03-18 — End: 2022-03-22
  Administered 2022-03-18: 16:00:00 325 mg via ORAL

## 2022-03-18 MED ORDER — magnesium sulfate in water IVPB premix 2 g
2 | Freq: Once | INTRAVENOUS | Status: AC
Start: 2022-03-18 — End: 2022-03-18
  Administered 2022-03-18: 15:00:00 2 g via INTRAVENOUS
  Administered 2022-03-18: 18:00:00 2504 g via INTRAVENOUS

## 2022-03-18 MED ORDER — morphine 10 mg/5 mL oral solution 5 mg
10 | ORAL | Status: DC | PRN
Start: 2022-03-18 — End: 2022-03-19
  Administered 2022-03-18 – 2022-03-19 (×7): 10 mg via ORAL

## 2022-03-18 MED ORDER — sodium chloride 0.9 % (NS) for medication or blood product administration 250 mL
0.9 | INTRAVENOUS | Status: DC | PRN
Start: 2022-03-18 — End: 2022-03-18

## 2022-03-18 NOTE — Plan of Care (Signed)
Pt and husband discussed plan of care including an exploration of hospice options to honor pt's desire to be home above all else. Pt received one unit of blood able to tolerate small amounts of PO intake.

## 2022-03-18 NOTE — Progress Notes (Signed)
Daily Progress Note  Resident Medical Team    Pt. Name/Age/DOB: Brandy [name]     66 y.o.   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   ?  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 Complicated by seeded port, removed 9/30. Negative blood cultures since 9/30.   - Continue oxacillin   - Access difficult - Brandy [name] to discuss with IR per notes  ?  #RLE saphenous DVT  Developed 10/3, unable to anticoagulate due to [facility]. Also hx of HIT. CTA pulmonary negative on 10/4. Dr. Crissie Figures consulted.   - Following with serial DVT US (repeat 10/9)      #Acute Hypoxic Respiratory Failure   No baseline O2 use. No known lung disease. - RTDP  - O2 to keep sats >90%, weaning as tolerated  ?  #Trace?Non-Aneurysmal?Subarachnoid Hemorrhage  Seen on CT head on admission. Discussed with Neurosurgery by ED physician, gave no recommendations for management.?MRI 10/2 shows stable small SAH, no metastases.  - Seizure precautions   - Avoid anticoagulation for 4 weeks (until 10/24)   ?  #Pancytopenia   At baseline, has iron deficiency anemia and chemotherapy-induced anemia. Hgb intermittently dropped, requiring blood transfusions, now s/p 4upRBCs.  - Neutropenic precautions  - Continue to trend H&H  - TT Hgb <8  - Monitor for bleeding, FOBT    Chronic, Presumed Stable Hospital Problems  #HFrEF: Continue carvedilol.  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 Following with Dr. Nadeen Landau at [facility], currently on?Docetaxel/Trastuzumab. Metastatic to lymph node. Last treatment 02/09/2022. Required blood transfusions due to chemotherapy.?    Feeding: Per NG tube, diet is advancing slowly per SLP  Analgesia:?APAP, morphine liquid PRN  Ulcer prophylaxis:?PPI  Glucose:?5.5% (07/27/2020); 6.0% (01/26/2016); 5.2% (01/21/2016)?  Bowel regimen:?PRN  I - Lines/Drains/Airway:?CVC R IJ 9/26 - current   ?  CODE: DNR/DNI per multiple conversations with patient's husband?   Verbal MDPOA: Husband, [name]  DISPO: PCU    Active Hospital Problems    Diagnosis SNOMED CT(R) Date Noted   . Infection associated with central venous catheter INFECTION OF VASCULAR CATHETER 03/16/2022   . 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 Subarachnoid hemorrhage (HCC) HEMORRHAGE INTO SUBARACHNOID SPACE OF NEURAXIS 03/07/2022   . Acute encephalopathy DISORDER OF BRAIN 03/07/2022   . Septic shock (HCC) SEPTIC SHOCK 03/07/2022   . Acute pyelonephritis ACUTE PYELONEPHRITIS 03/07/2022   . Acute renal failure, unspecified acute renal failure type (HCC) ACUTE KIDNEY INJURY 03/07/2022   . SAH (subarachnoid hemorrhage) (HCC) HEMORRHAGE INTO SUBARACHNOID SPACE OF NEURAXIS 03/07/2022   . Encephalopathy acute DISORDER OF BRAIN 03/07/2022   . 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 Liver lesion- small hypodense lesion around falciform lig LESION OF LIVER 01/21/2016       Corinna Gab, MD  03/18/2022 7:00 AM    Subjective  Interim events: NAE. Brandy [name] is excited to be eating again. Per RN, at one sherbet and a few bites of pear puree yesterday, liquids she is doing well with. Running feeds ON and pausing during the day.     Objective      Current 24 Hour Min / Max      Temp    36 ?C (96.8 ?F) at 7:00 AM    Temp  Min: 36 ?C (96.8 ?F)  Max: 36.2 ?C (97.2 ?F)      BP     (!) 92/56 (RN  notified) at 7:00 AM    BP  Min: 91/58  Max: 132/80      HR    96 at 7:00 AM    Pulse  Min: 93  Max: 112      RR    22 at 7:00 AM    Resp  Min: 20  Max: 24      Sats    SpO2: 95 % on O2 Flow Rate (L/min): 3 L/min Oximizer     SpO2  Min: 91 %  Max: 96 %      Weight    244 lb 14.4 oz (111.1 kg)  Body mass index is 43.38 kg/m?.7:00 AM    Admit: 228 lb (103.4 kg)       Intake/Output Summary (Last 24 hours) at 03/18/2022 0700  Last data filed at 03/18/2022 0400  Gross per 24 hour   Intake 1474 ml   Output 825 ml   Net 649 ml        Physical Examination:  General: Chronically ill, deconditioned-appearing, pale, no apparent distress.   HEENT: MMM. No LAD. PERRL. Conjunctiva clear. NG in R nare running feeds.   Pulm: Speaking in full sentences comfortably on 3L. CTAB.   CV: RRR. No MRG. Pedalis pulses 2+.   Abd: Soft, no tenderness, rebound, or guarding. BS +. No masses.   MSK: Limbs well-perfused and warm. Atraumatic. No edema.   Skin: No rash to visualized areas. Capillary refill 2 seconds.  Neuro: No focal deficits. Moving limbs symmetrically and spontaneously. Sensation intact. CNs grossly intact.   Psych: Mood, affect, judgement normal.     Laboratory studies, microbiology, and imaging studies reviewed and acted upon if needed.

## 2022-03-18 NOTE — Plan of Care (Signed)
Pt vss, hypotension noted at baseline, tf restarted at 2000, pt tolerated po and tf, foley patent and ngtube in place, dilaudid given for effective pain control, pain source is right knee and ankle      Problem: Safety  Goal: Patient will be injury free during hospitalization  Description: Assess and monitor vitals signs, neurological status including level of consciousness and orientation. Assess patient's risk for falls and implement fall prevention plan of care and interventions per hospital policy.      Ensure arm band on, uncluttered walking paths in room, adequate room lighting, call light and overbed table within reach, bed in low position, wheels locked, side rails up per policy, and non-skid footwear provided.   Outcome: Progressing     Problem: Knowledge Deficit  Goal: Patient/family/caregiver demonstrates understanding of disease process, treatment plan, medications, and discharge instructions  Description: Complete learning assessment and assess knowledge base.  Outcome: Progressing     Problem: Risk for Falls  Goal: No falls during hospitalization  Description: Patient will not fall during hospitalization.  Outcome: Progressing     Problem: Knowledge Deficit  Goal: Knowledge - personal safety  Description: Patient will verbalize understanding of fall prevention.  Outcome: Progressing     Problem: Pain  Goal: Patient will achieve activity goals and show improvement in function, mood, and coping  Outcome: Progressing  Goal: Pain level within patient's desired comfort-function goal  Outcome: Progressing  Goal: Patient will verbalize understanding and be an active participant in the pain management care plan  Outcome: Progressing  Goal: Patient will be able to identify strategies to reduce anxiety and improve coping  Outcome: Progressing     Problem: [place] Braden Scale Score 18 & Lower  Goal: For scores 18 & lower:  Description: For scores 18 & lower:  Outcome: Progressing     Problem: Insufficient  Fluid Volume  Goal: Fluid and electrolyte balance are achieved/maintained  Description: Assess and monitor vital signs (orthostatic vitals if applicable), fluid intake and output, urine color, labs, skin turgor, mucous membranes, mental status, and gastrointestinal system for nausea, vomiting and diarrhea.  Monitor for signs and symptoms of hypovolemia (tachycardia, rapid breathing, decreased urine output, postural hypotension, confusion, syncope).  Collaborate with interdisciplinary team and initiate plan and interventions as ordered.  Outcome: Progressing     Problem: Hemodynamic Status  Goal: Patient's vitals signs are stable  Description: Assess and monitor patient's heart rate, rhythm, respiratory rate, peripheral pulses, capillary refill, color, body temperature, intake and output, labs and physical activity tolerance.   Observe for signs of chest pain (note location, duration, severity, radiation and associated symptoms such as diaphoresis, nausea, indigestion).  Monitor for signs and symptoms of heart failure (eg. shortness of breath, edema of feet/ankles/legs, rapid irregular heart rate, coughing, wheezing, white/pink blood tinged sputum, sudden weight gain, chest pain). Collaborate with interdisciplinary team and initiate plan and interventions as ordered.  Outcome: Progressing     Problem: Insufficient Nutritional Intake  Goal: Patient's nutritional intake is adequate  Description: Assess and monitor food intake and supplements, patient food preferences, nausea, vomiting, labs, oral cavity (gums, teeth, tongue, mucosa), proper denture fit, and cultural beliefs.  Monitor for signs of hypoglycemia and hyperglycemia.  Collaborate with interdisciplinary team and initiate plan and interventions as ordered.  Outcome: Progressing     Problem: Inadequate Mobility/Activity Status  Goal: Mobility/activity is maintained at optimum level for patient  Description: Assess and monitor patient  barriers to mobility and  need for assistive/adaptive devices. Assess  patient's emotional response to limitations. Collaborate with interdisciplinary team and initiate plans and interventions as ordered.  Outcome: Progressing     Problem: Infection  Goal: Signs and symptoms of infections are decreased or avoided  Description: Assess and monitor patient for signs and symptoms of infection such as redness, warmth, discharge, and increased body temperature. Monitor and report abnormal lab values (ex-CBC and diff, serum protein, serum albumin, and cultures).  Wash hands properly before and after each patient care activity. Utilize standard precautions and use personal protective equipment (PPE) as indicated. Ensure aseptic care of all intravenous lines and invasive tubes/drains. Obtain immunization and exposure to communicable diseases history. Collaborate with interdisciplinary team and initiate plan and interventions as ordered.  Outcome: Progressing     Problem: Compromised Skin Integrity  Goal: Skin integrity is maintained or improved  Outcome: Progressing     Problem: Potential for Infection  Goal: Remains infection free  Outcome: Progressing     Problem: Risk for Compromised Skin Integrity  Goal: Skin integrity is maintained or improved  Outcome: Progressing     Problem: Risk for Pressure, Friction, and Shear  Goal: Reduction of pressure, friction, and shear  Outcome: Progressing     Problem: Risk for Decreased Activity  Goal: Repositioning and early mobility  Outcome: Progressing     Problem: Risk for Excessive Moisture  Goal: Skin care and moisture management  Outcome: Progressing     Problem: Risk for Insufficient Nutritional Intake  Goal: Adequate nutrition status  Outcome: Progressing     Problem: Metabolic/Fluid and Electrolytes  Goal: Electrolytes maintained within normal limits  Description: INTERVENTIONS:  1. Monitor labs and assess patient for signs and symptoms of electrolyte imbalances  2. Administer electrolyte replacement as  ordered  3. Monitor response to electrolyte replacements, including repeat lab results as appropriate  4. Fluid restriction as ordered  5. Instruct patient on fluid and nutrition restrictions as appropriate  Outcome: Progressing  Goal: Hemodynamic stability and optimal renal function maintained  Description: INTERVENTIONS:  1. Monitor labs and assess for signs and symptoms of volume excess or deficit  2. Monitor intake, output and patient weight  3. Monitor urine specific gravity, serum osmolarity and serum sodium as indicated or ordered  4. Monitor response to interventions for patient's volume status, including labs, urine output, blood pressure (other measures as available)  5. Encourage oral intake as appropriate  6. Instruct patient on fluid and nutrition restrictions as appropriate  Outcome: Progressing

## 2022-03-19 LAB — CELLAVISION DIFFERENTIAL
Bands %: 2 % (ref 0–2)
Bands, Absolute: 0 10*3/ÂµL
Basophils %: 1 % (ref 0–2)
Basophils, Absolute: 0 10*3/ÂµL (ref 0.0–0.1)
Eosinophils %: 3 % (ref 0–5)
Eosinophils, Absolute: 0.1 10*3/ÂµL (ref 0.0–0.4)
Lymphocytes %: 14 % — ABNORMAL LOW (ref 20–40)
Lymphocytes, Absolute: 0.3 10*3/ÂµL — ABNORMAL LOW (ref 1.0–4.0)
Metamyelocytes %: 1 %
Metamyelocytes, Absolute: 0 10*3/ÂµL
Monocytes %: 8 % (ref 2–12)
Monocytes, Absolute: 0.2 10*3/ÂµL (ref 0.2–1.0)
Myelocytes %: 1 %
Myelocytes, Absolute: 0 10*3/ÂµL
Neutrophils %: 69 % (ref 50–74)
Neutrophils, Absolute: 1.5 10*3/ÂµL — ABNORMAL LOW (ref 2.0–7.4)
Platelet Estimate: DECREASED — AB
Total Cells Counted: 100
WBC: 2.1 10*3/ÂµL — ABNORMAL LOW (ref 4.5–11.0)
nRBC: 1 /100

## 2022-03-19 LAB — COMPREHENSIVE METABOLIC PANEL
ALT - Alanine Aminotransferase: 17 IU/L (ref 5–33)
AST - Aspartate Aminotransferase: 26 IU/L (ref 5–32)
Albumin/Globulin Ratio: 0.5 — ABNORMAL LOW (ref 1.2–2.2)
Albumin: 2.1 g/dL — ABNORMAL LOW (ref 3.5–5.0)
Alkaline Phosphatase: 79 IU/L (ref 35–105)
Anion Gap: 10.5 mmol/L (ref 9.0–18.0)
BUN / Creatinine Ratio: 28.6 — ABNORMAL HIGH (ref 12.0–20.0)
BUN: 34 mg/dL — ABNORMAL HIGH (ref 8–20)
Bilirubin Total: 0.5 mg/dL (ref 0.10–1.70)
CO2 - Carbon Dioxide: 23 mmol/L (ref 17–27)
Calcium: 7.9 mg/dL — ABNORMAL LOW (ref 8.6–10.6)
Chloride: 104.5 mmol/L (ref 101.0–111.0)
Creatinine: 1.19 mg/dL (ref 0.60–1.30)
Glomerular Filtration Rate Estimate (Female): 50 mL/min/{1.73_m2} — ABNORMAL LOW (ref >=60)
Glucose: 201 mg/dL — ABNORMAL HIGH (ref 74–106)
Osmolality Calculation: 289 mOsm/kg (ref 275.0–300.0)
Potassium: 4.23 mmol/L (ref 3.50–5.10)
Protein Total: 6.1 g/dL — ABNORMAL LOW (ref 6.4–8.2)
Sodium: 138 mmol/L (ref 135–145)

## 2022-03-19 LAB — CBC WITH AUTO DIFFERENTIAL
HCT: 24.1 % — ABNORMAL LOW (ref 35.0–48.0)
Hemoglobin: 8.1 g/dL — ABNORMAL LOW (ref 11.7–16.5)
MCH: 30.4 pg (ref 28.3–33.3)
MCHC: 33.4 g/dL (ref 32.5–36.0)
MCV: 90.9 fL (ref 81.0–100.0)
MPV: 8.9 fL (ref 6.9–10.0)
Platelet Count: 115 10*3/ÂµL — ABNORMAL LOW (ref 150–405)
RBC: 2.65 10*6/ÂµL — ABNORMAL LOW (ref 3.80–5.60)
RDW: 17.9 % — ABNORMAL HIGH (ref 11.7–16.1)
WBC: 2.1 10*3/ÂµL — ABNORMAL LOW (ref 4.5–11.0)

## 2022-03-19 LAB — PHOSPHORUS: Phosphorus: 3.9 mg/dL (ref 2.5–4.7)

## 2022-03-19 LAB — MAGNESIUM: Magnesium: 1.5 mg/dL (ref 1.3–2.5)

## 2022-03-19 MED ORDER — morphine 10 mg/5 mL oral solution 5-15 mg
10 | ORAL | Status: DC | PRN
Start: 2022-03-19 — End: 2022-03-22
  Administered 2022-03-19 – 2022-03-22 (×16): 10 mg via ORAL

## 2022-03-19 MED ORDER — amoxicillin (AMOXIL) 250 mg/5 mL oral suspension 500 mg
250 | Freq: Three times a day (TID) | ORAL | Status: DC
Start: 2022-03-19 — End: 2022-03-21
  Administered 2022-03-19 – 2022-03-21 (×6): 250 mg via ORAL

## 2022-03-19 MED ORDER — scopolamine (TRANSDERM-SCOP) 1.5 mg (1 mg/72 hrs) patch 1 patch
1.5 | TRANSDERMAL | Status: DC | PRN
Start: 2022-03-19 — End: 2022-03-22
  Administered 2022-03-21: 16:00:00 1.5 via TRANSDERMAL

## 2022-03-19 MED ORDER — morphine 10 mg/5 mL oral solution 5-10 mg
10 | ORAL | Status: DC | PRN
Start: 2022-03-19 — End: 2022-03-19

## 2022-03-19 NOTE — Plan of Care (Signed)
Vss, tf cont at goal rate, poor po intake with meals, morphine oral given for pain and abx cont, pt compliant with poc and openly discussing satisfaction in decision with hospice      Problem: Safety  Goal: Patient will be injury free during hospitalization  Description: Assess and monitor vitals signs, neurological status including level of consciousness and orientation. Assess patient's risk for falls and implement fall prevention plan of care and interventions per hospital policy.      Ensure arm band on, uncluttered walking paths in room, adequate room lighting, call light and overbed table within reach, bed in low position, wheels locked, side rails up per policy, and non-skid footwear provided.   Outcome: Progressing     Problem: Knowledge Deficit  Goal: Patient/family/caregiver demonstrates understanding of disease process, treatment plan, medications, and discharge instructions  Description: Complete learning assessment and assess knowledge base.  Outcome: Progressing     Problem: Risk for Falls  Goal: No falls during hospitalization  Description: Patient will not fall during hospitalization.  Outcome: Progressing     Problem: Knowledge Deficit  Goal: Knowledge - personal safety  Description: Patient will verbalize understanding of fall prevention.  Outcome: Progressing     Problem: Pain  Goal: Patient will achieve activity goals and show improvement in function, mood, and coping  Outcome: Progressing  Goal: Pain level within patient's desired comfort-function goal  Outcome: Progressing  Goal: Patient will verbalize understanding and be an active participant in the pain management care plan  Outcome: Progressing  Goal: Patient will be able to identify strategies to reduce anxiety and improve coping  Outcome: Progressing     Problem: [place] Braden Scale Score 18 & Lower  Goal: For scores 18 & lower:  Description: For scores 18 & lower:  Outcome: Progressing     Problem: Insufficient Fluid Volume  Goal:  Fluid and electrolyte balance are achieved/maintained  Description: Assess and monitor vital signs (orthostatic vitals if applicable), fluid intake and output, urine color, labs, skin turgor, mucous membranes, mental status, and gastrointestinal system for nausea, vomiting and diarrhea.  Monitor for signs and symptoms of hypovolemia (tachycardia, rapid breathing, decreased urine output, postural hypotension, confusion, syncope).  Collaborate with interdisciplinary team and initiate plan and interventions as ordered.  Outcome: Progressing     Problem: Hemodynamic Status  Goal: Patient's vitals signs are stable  Description: Assess and monitor patient's heart rate, rhythm, respiratory rate, peripheral pulses, capillary refill, color, body temperature, intake and output, labs and physical activity tolerance.   Observe for signs of chest pain (note location, duration, severity, radiation and associated symptoms such as diaphoresis, nausea, indigestion).  Monitor for signs and symptoms of heart failure (eg. shortness of breath, edema of feet/ankles/legs, rapid irregular heart rate, coughing, wheezing, white/pink blood tinged sputum, sudden weight gain, chest pain). Collaborate with interdisciplinary team and initiate plan and interventions as ordered.  Outcome: Progressing     Problem: Insufficient Nutritional Intake  Goal: Patient's nutritional intake is adequate  Description: Assess and monitor food intake and supplements, patient food preferences, nausea, vomiting, labs, oral cavity (gums, teeth, tongue, mucosa), proper denture fit, and cultural beliefs.  Monitor for signs of hypoglycemia and hyperglycemia.  Collaborate with interdisciplinary team and initiate plan and interventions as ordered.  Outcome: Progressing     Problem: Inadequate Mobility/Activity Status  Goal: Mobility/activity is maintained at optimum level for patient  Description: Assess and monitor patient  barriers to mobility and need for  assistive/adaptive devices. Assess patient's emotional response  to limitations. Collaborate with interdisciplinary team and initiate plans and interventions as ordered.  Outcome: Progressing     Problem: Infection  Goal: Signs and symptoms of infections are decreased or avoided  Description: Assess and monitor patient for signs and symptoms of infection such as redness, warmth, discharge, and increased body temperature. Monitor and report abnormal lab values (ex-CBC and diff, serum protein, serum albumin, and cultures).  Wash hands properly before and after each patient care activity. Utilize standard precautions and use personal protective equipment (PPE) as indicated. Ensure aseptic care of all intravenous lines and invasive tubes/drains. Obtain immunization and exposure to communicable diseases history. Collaborate with interdisciplinary team and initiate plan and interventions as ordered.  Outcome: Progressing     Problem: Compromised Skin Integrity  Goal: Skin integrity is maintained or improved  Outcome: Progressing     Problem: Potential for Infection  Goal: Remains infection free  Outcome: Progressing     Problem: Risk for Compromised Skin Integrity  Goal: Skin integrity is maintained or improved  Outcome: Progressing     Problem: Risk for Pressure, Friction, and Shear  Goal: Reduction of pressure, friction, and shear  Outcome: Progressing     Problem: Risk for Decreased Activity  Goal: Repositioning and early mobility  Outcome: Progressing     Problem: Risk for Excessive Moisture  Goal: Skin care and moisture management  Outcome: Progressing     Problem: Risk for Insufficient Nutritional Intake  Goal: Adequate nutrition status  Outcome: Progressing     Problem: Metabolic/Fluid and Electrolytes  Goal: Electrolytes maintained within normal limits  Description: INTERVENTIONS:  1. Monitor labs and assess patient for signs and symptoms of electrolyte imbalances  2. Administer electrolyte replacement as  ordered  3. Monitor response to electrolyte replacements, including repeat lab results as appropriate  4. Fluid restriction as ordered  5. Instruct patient on fluid and nutrition restrictions as appropriate  Outcome: Progressing  Goal: Hemodynamic stability and optimal renal function maintained  Description: INTERVENTIONS:  1. Monitor labs and assess for signs and symptoms of volume excess or deficit  2. Monitor intake, output and patient weight  3. Monitor urine specific gravity, serum osmolarity and serum sodium as indicated or ordered  4. Monitor response to interventions for patient's volume status, including labs, urine output, blood pressure (other measures as available)  5. Encourage oral intake as appropriate  6. Instruct patient on fluid and nutrition restrictions as appropriate  Outcome: Progressing

## 2022-03-19 NOTE — Progress Notes (Signed)
Daily Progress Note  Resident Medical Team    Pt. Name/Age/DOB: Brandy [name]     66 y.o.   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 On 10/7, discussion of her goals, she desires to be home above all else. Hospice consulted. Transitioning to [facility]ARE.    ?  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 Complicated by seeded port, removed 9/30. Negative blood cultures since 9/30.   - Plan to switch to PO antibiotics   ?  #RLE saphenous DVT  Developed 10/3, unable to anticoagulate due to [facility]. Also hx of HIT. CTA pulmonary negative on 10/4. Dr. Crissie Figures consulted. Planned for repeat DVT scan for monitoring 10/9, but given patient's goals of care, will defer further work-up.       #Acute Hypoxic Respiratory Failure   No baseline O2 use. No known lung disease.  ?  #Trace?Non-Aneurysmal?Subarachnoid Hemorrhage  Seen on CT head on admission. Discussed with Neurosurgery by ED physician, gave no recommendations for management.?MRI 10/2 shows stable small SAH, no metastases. May have been from septic emboli. Maintained on seizure precautions acutely, now D/C'd.   ?  #Pancytopenia   At baseline, has iron deficiency anemia and chemotherapy-induced anemia. Hgb intermittently dropped, requiring blood transfusions, now s/p 4 upRBCs during hospitalizaiton.     Chronic, Presumed Stable Hospital Problems  #HFrEF: Continue carvedilol.  #Ductal Carcinoma,  Left Breast: Diagnosed in 2023. HER2 +. Following with Dr. Nadeen Landau at [facility], currently on?Docetaxel/Trastuzumab. Metastatic to lymph node. Last treatment 02/09/2022. Required blood transfusions due to chemotherapy.?    Feeding: Advance as tolerated   Analgesia:?APAP, morphine liquid PRN  Ulcer prophylaxis:?PPI  Glucose:?5.5% (07/27/2020); 6.0% (01/26/2016); 5.2% (01/21/2016)?  Bowel regimen:?PRN  I - Lines/Drains/Airway:?CVC R IJ 9/26 - 10/8  ?  CODE: DNR/DNI per multiple conversations with patient's husband?   Verbal MDPOA: Husband, [name]  DISPO: PCU    Active Hospital Problems    Diagnosis SNOMED CT(R) Date Noted   . Infection associated with central venous catheter INFECTION OF VASCULAR CATHETER 03/16/2022   . 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 Subarachnoid hemorrhage (HCC) HEMORRHAGE INTO SUBARACHNOID SPACE OF NEURAXIS 03/07/2022   . Acute encephalopathy DISORDER OF BRAIN 03/07/2022   . Septic shock (HCC) SEPTIC SHOCK 03/07/2022   . Acute pyelonephritis ACUTE PYELONEPHRITIS 03/07/2022   . Acute renal failure, unspecified acute renal failure type (HCC) ACUTE KIDNEY INJURY 03/07/2022   . SAH (subarachnoid hemorrhage) (HCC) HEMORRHAGE INTO SUBARACHNOID SPACE OF NEURAXIS 03/07/2022   . Encephalopathy acute DISORDER OF BRAIN 03/07/2022   . 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 Liver lesion- small hypodense lesion around falciform lig LESION OF LIVER 01/21/2016       Corinna Gab, MD  03/19/2022 6:59 AM    Subjective     Interim events: Long discussion of [name]'s goals with her husband present 10/7 >  prefers to be home above all else. This morning, [name] is excited to be hopefully going home and would like to have her NG tube, CVC removed. Prefers to continue with antibiotics orally.      Objective      Current 24 Hour Min / Max      Temp    36.1 ?C (96.9 ?F) at 6:59 AM    Temp   Min: 36 ?C (96.8 ?F)  Max: 36.8 ?C (98.3 ?F)      BP     (!) 97/56 (RN notified) at 6:59 AM    BP  Min: 97/56  Max: 141/98      HR    105 at 6:59 AM    Pulse  Min: 90  Max: 112      RR    22 at 6:59 AM    Resp  Min: 16  Max: 44      Sats    SpO2: 95 % on O2 Flow Rate (L/min): 4 L/min High flow nasal cannula     SpO2  Min: 90 %  Max: 97 %      Weight    254 lb (115.2 kg)  Body mass index is 44.99 kg/m?.6:59 AM    Admit: 228 lb (103.4 kg)       Intake/Output Summary (Last 24 hours) at 03/19/2022 0659  Last data filed at 03/19/2022 0500  Gross per 24 hour   Intake 2790 ml   Output 1200 ml   Net 1590 ml        Physical Examination:   General: Chronically ill, deconditioned-appearing, pale, no apparent distress.   HEENT: MMM. No LAD. PERRL. Conjunctiva clear. NG in R nare running feeds.   Pulm: Speaking in full sentences comfortably on 3L. CTAB.   CV: RRR. No MRG. Pedalis pulses 2+.   Abd: Soft, no tenderness, rebound, or guarding. BS +. No masses.   MSK: Limbs well-perfused and warm. Atraumatic. No edema.   Skin: No rash to visualized areas. Capillary refill 2 seconds.  Neuro: No focal deficits. Moving limbs symmetrically and spontaneously. Sensation intact. CNs grossly intact.   Psych: Mood, affect, judgement normal.     Laboratory studies, microbiology, and imaging studies reviewed and acted upon if needed.

## 2022-03-19 NOTE — Plan of Care (Signed)
Pt on comfort care at this time. Awaiting visit from [facility] tomorrow at 10am with husband. Pt able to speak with three sons on the phone. Family updated per pt's wishes.

## 2022-03-20 NOTE — Other (Signed)
SPEECH LANGUAGE PATHOLOGY BRIEF NOTE - INPATIENT     Patient nameCrist [name]    Chart review reveals patient has active comfort care orders with plan to discharge home with hospice.  No further speech therapy indicated.    Speech Language Pathologist:  Wyatt Haste, CCC-SLP  Date: 03/20/2022  Time:  11:31 AM

## 2022-03-20 NOTE — Progress Notes (Signed)
Pt arrives to room 218 via bed with transport and resource RN at approximately 1810. Upon entering room pt resting quietly, eyes closed with oxymizer at 3L, pillow support on R side and foley intact. Plan to offer comfort interventions as pt needs.

## 2022-03-20 NOTE — Progress Notes (Signed)
Wound/Ostomy Care:     In light of transition of code status and goals of care to Comfort Measures, Wound Care is signing off.    Please continue current treatment plan for as tolerated by patient.    Local wound and skin care, moisture barrier, antifungal powder, pressure offloading.  ?  Intertrigo -?  Cleanse affected areas with bath cloths and dry well.   Apply Nystatin powder to skin folds and rub in well, Q 12 hours. Tuck Skinfold dry sheets into skin folds.  Apply Clear Aid moisture barrier ointment to gluteal cleft and perineum twice daily and PRN.  Apply Allevyn sacral silicone dressing to sacral area for protection/prevention.  ?  Left heel -?  Cleanse with wound cleanser spray and pat dry.  Wipe periwound skin with skin barrier/prep wipe and allow to dry.  Apply Allevyn silicone bordered foam dressing and change Q 3 days and PRN.  Float heels.  ?  Provide pressure offloading measures; recommend Waffle air mattress overlay, turn/reposition Q 2 hours and PRN using pillows/wedges to offload the sacrum, float heels off the bed surface using pillows or boots, pad bony prominences.?    Please call or re-consult for any further questions or concerns.     Thank you.    K. Dareen Piano, BSN, RN, [employer]  [phone]

## 2022-03-20 NOTE — Discharge Planning (AHS/AVS) (Addendum)
CM PROGRESS NOTE       Plan: Home with [facility] 03/22/2022    General Overview: This CM sent pt's information to [facility] per pt's request.  -[facility] plans to meet with pt and pt's husband [name] today at 1000..  -This CM will [name] after [facility] today.  -TJonny Ruizis CM will continue to follow and assist with discharge needs/planning.    -This CM received a call from Kayla with [facility] and pt and [facility]Jon[name] and the plan is for pt to discharge home on Wednesday 03/22/2022.  Dorathy Daft is ordering all discharge DME through [employer].  -This CM will arrange gurney transport home on 03/22/2022.    -03/20/22 1:38 PM this CM [facility]  -This CM updJonny Ruizted pt's husband [name] and he [employer] since pt's address is on a unmarked street.  -[place] [employer] call Jonny Ruiz when they are five minutes from [employer]    Bonne Dolores CM

## 2022-03-20 NOTE — Progress Notes (Addendum)
Daily Progress Note  Resident Medical Team    Pt. Name/Age/DOB: Brandy [name]     66 y.o.   10-06-1955       Medical Record Number:   [id number]  CSN: [id number]  Date of admission:  03/07/2022  Primary Care Physician:  Bretta Bang  Admitting Physician:  Sammuel Cooper  Room: 306/306-01       Subjective     Interim events: NGT, CVC removed yesterday.    This am: Feeling well overall, requesting water and pain medications. Feels that current pain medication regimen is working well. Looking forward to going home. Husband was able to finish planting her flower bulbs over the weekend.     Objective      Current 24 Hour Min / Max      Temp    36 ?C (96.8 ?F) at 10:13 AM    No data recorded      BP     116/72 at 10:13 AM    No data recorded      HR    106 at 10:13 AM    No data recorded      RR    24 at 10:13 AM    No data recorded      Sats    SpO2: 97 % on O2 Flow Rate (L/min): 4 L/min OxyMask     No data recorded      Weight    115.2 kg (254 lb)  Body mass index is 44.99 kg/m?.10:13 AM    Admit: 103.4 kg (228 lb)       Intake/Output Summary (Last 24 hours) at 03/20/2022 1013  Last data filed at 03/20/2022 0336  Gross per 24 hour   Intake 170 ml   Output 500 ml   Net -330 ml        Physical Examination:   General: Chronically ill, deconditioned-appearing, pale, no apparent distress.   HEENT: MMM. No LAD. PERRL. Conjunctiva clear.   Pulm: Speaking in full sentences comfortably on 4L. CTAB.   CV: RRR. No MRG. Pedalis pulses 2+.   Abd: Soft, no tenderness, rebound, or guarding. BS +. No masses.   MSK: Limbs well-perfused and warm. Atraumatic. Trace lower extremity edema.   Skin: Trace scattered bruising .Capillary refill 2 seconds.  Neuro: No focal deficits. Moving limbs symmetrically and spontaneously. Sensation intact. CNs grossly intact.   Psych: Mood, affect, judgement normal.     Laboratory studies  Labs discontinued for comfort.     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 On 10/7, discussion of her goals, she desires to be home above all else. Hospice consulted. Transitioning to COMFORT CARE.      #Comfort care  Comfort care orders placed, meeting with hospice 10/9.   ?  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 Complicated by seeded port, removed 9/30. Negative blood cultures since 9/30.   - Amoxicillin 500 mg q8h to continue at home for suppression in line with goals of care.  ?  #RLE saphenous DVT  Developed 10/3, unable to anticoagulate due to [facility]. Also hx of HIT. CTA pulmonary negative on 10/4. Dr. Crissie Figures consulted. Planned for repeat DVT scan  for monitoring 10/9, but given patient's goals of care, will defer further work-up.       #Acute Hypoxic Respiratory Failure   No baseline O2 use. No known lung disease. Currently on 4 L.   - Continue O2 for comfort   - No O2 checks per comfort care orders  ?  #Trace?Non-Aneurysmal?Subarachnoid Hemorrhage  Seen on CT head on admission. Discussed with Neurosurgery by ED physician, gave no recommendations for management.?MRI 10/2 shows stable small SAH, no metastases. May have been from septic emboli. Maintained on seizure precautions acutely, now D/C'd.   ?  #Pancytopenia   At baseline, has iron deficiency anemia and chemotherapy-induced anemia. Hgb intermittently dropped, requiring blood transfusions, now s/p 4 upRBCs during hospitalizaiton. No longer monitoring per comfort care .    Chronic, Presumed Stable Hospital Problems  #HFrEF: Continue carvedilol.  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 Following with Dr. Nadeen Landau at [facility], currently on?Docetaxel/Trastuzumab. Metastatic  to lymph node. Last treatment 02/09/2022. Required blood transfusions due to chemotherapy.?    Feeding: Advance as tolerated  Analgesia:?APAP, morphine liquid PRN  Bowel regimen:?PRN  I - Lines/Drains/Airway:?CVC R IJ 9/26 - 10/8; NGT d/c'd  ?  CODE: DNR/DNI per multiple conversations with patient's husband?   Verbal MDPOA: Husband, [name]  DISPO: medical    Active Hospital Problems    Diagnosis SNOMED CT(R) Date Noted   . Infection associated with central venous catheter INFECTION OF VASCULAR CATHETER 03/16/2022   . 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 Subarachnoid hemorrhage (HCC) HEMORRHAGE INTO SUBARACHNOID SPACE OF NEURAXIS 03/07/2022   . Acute encephalopathy DISORDER OF BRAIN 03/07/2022   . Septic shock (HCC) SEPTIC SHOCK 03/07/2022   . Acute pyelonephritis ACUTE PYELONEPHRITIS 03/07/2022   . Acute renal failure, unspecified acute renal failure type (HCC) ACUTE KIDNEY INJURY 03/07/2022   . SAH (subarachnoid hemorrhage) (HCC) HEMORRHAGE INTO SUBARACHNOID SPACE OF NEURAXIS 03/07/2022   . Encephalopathy acute DISORDER OF BRAIN 03/07/2022   . 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 Liver lesion- small hypodense lesion around falciform lig LESION OF LIVER 01/21/2016     Sammuel Cooper, MD  03/20/22  10:13 AM     Patient was seen by and discussed with Dr. Vinson Moselle, who agrees with the above unless otherwise noted.

## 2022-03-20 NOTE — Plan of Care (Signed)
Pt resting comfortably. Lotion applied to bilat forearms for dry, flaky skin. Pt repositioned and oral care for comfort. Foley patent. No acute events overnight. WCTM

## 2022-03-20 NOTE — Consults (Signed)
INPATIENT MEDICAL NUTRITION THERAPY     Brandy [name] is a 66 y.o. female, DOB Oct 14, 1955    Reason for assessment: TF  Diagnosis: septic shock  Pertinent medical history: CHF, colon cancer and metastatic breast cancer      NUTRITION ASSESSMENT    Anthropometric Measurements:   Ht: 160 cm (63)         Initial Wt: 103.4 kg (228 lb)   Updated Wt: 110.5 kg  IBW: 64 kg   %IBW: 172   BMI: 43.15  Nutrition Ht/Wt Status: morbid obesity   % wt loss: none, pt is currently fluid positive     Wt hx:   Wt Readings from Last 10 Encounters:   03/19/22 115.2 kg (254 lb)   02/23/22 107.8 kg (237 lb 11.2 oz)   02/23/22 108.1 kg (238 lb 4.8 oz)   02/09/22 107.1 kg (236 lb 1.6 oz)   02/02/22 107.8 kg (237 lb 11.2 oz)   01/19/22 110.1 kg (242 lb 11.2 oz)   12/29/21 112.1 kg (247 lb 3.2 oz)   12/08/21 113.2 kg (249 lb 9.6 oz)   12/01/21 112.9 kg (249 lb)   11/23/21 111.2 kg (245 lb 3.2 oz)         Biochemical Data, Medical Tests, Procedures:  Lab Results   Component Value Date/Time    NA 138 03/19/2022 05:36 AM    K 4.23 03/19/2022 05:36 AM    CL 104.5 03/19/2022 05:36 AM    CO2 23 03/19/2022 05:36 AM    GLU 201 (H) 03/19/2022 05:36 AM    BUN 34 (H) 03/19/2022 05:36 AM    CREATININES 1.19 03/19/2022 05:36 AM    LABCREA 77 01/28/2016 06:36 PM    MG 1.5 03/19/2022 05:36 AM    PHOS 3.9 03/19/2022 05:36 AM    AST 26 03/19/2022 05:36 AM    ALT 17 03/19/2022 05:36 AM       Lab Results   Component Value Date/Time    HGB 8.1 (L) 03/19/2022 05:36 AM    HCT 24.1 (L) 03/19/2022 05:36 AM    MCV 90.9 03/19/2022 05:36 AM       Lab Results   Component Value Date/Time    CHOL 123 02/08/2018 02:06 PM    HDL 30 (L) 02/08/2018 02:06 PM    LDLCALC 55 02/08/2018 02:06 PM    TRIG 192.0 (H) 02/08/2018 02:06 PM       Lab Results   Component Value Date/Time    POCGLU 111 (H) 02/15/2016 11:12 AM    POCGLU 118 (H) 02/10/2016 07:44 PM    POCGLU 91 02/10/2016 04:05 PM    POCGLU 162 (H) 02/10/2016 12:17 PM    POCGLU 102 (H) 02/10/2016 07:57 AM    GLYCOA1C 5.5  07/27/2020 04:32 PM       GFR Estimate   Date Value Ref Range Status   02/24/2016 >60 >=60 mL/min/1.5m*2 Final   02/21/2016 >60 >=60 mL/min/1.66m*2 Final   02/18/2016 >60 >=60 mL/min/1.74m*2 Final     Glomerular Filtration Rate Estimate   Date Value Ref Range Status   03/06/2016 >60.0 >=60.0 mL/min/1.9m*2 Final   01/23/2016 >60.0 >=60.0 mL/min/1.72m*2 Final   01/23/2016 >60.0 >=60.0 mL/min/1.70m*2 Final     Glomerular Filtration Rate Estimate (Female)   Date Value Ref Range Status   02/28/2021 79 >=60 mL/min/1.78m*2 Final   09/08/2020 83 >=60 mL/min/1.49m*2 Final   07/27/2020 87 >=60 mL/min/1.36m*2 Final   10/04/2018 >60.0 >=60.0 mL/min/1.2m*2 Final   06/25/2018 59.4 (L) >=60.0  mL/min/1.65m*2 Final   04/05/2018 >60.0 >=60.0 mL/min/1.42m*2 Final        Current scheduled meds include:   . amoxicillin  500 mg Oral Q8H SCH   . diclofenac   Topical BID   . nystatin   Topical 2 times per day   . polyvinyl alcohol-povidone(PF)  1 drop Both Eyes 4x Daily while awake         PRN Meds include:  acetaminophen (TYLENOL) suppository, acetaminophen, albuterol, calcium carbonate, lidocaine, lidocaine, magnesium hydroxide, morphine, naloxone (NARCAN) injection 0.04 mg, naloxone, ondansetron **OR** ondansetron, polyethylene glycol, scopolamine, senna-docusate    Food allergies: none  Other Allergies: Bee sting [allergen ext-venom-honey bee], Heparin, and Sulfa (sulfonamide antibiotics)    Food/Nutrition-Related History:  Diet Orders:   Procedures   . Diet Consistency: Dysphagia; Texture: Pureed (IDDSI 4); Type: Regular; Level of Liquid Thickness: Thin (IDDSI 0)         Date of Last Bowel movement: 10/4 - discussed with RN    Skin Concerns: edema noted on all extremities    Estimated Needs  Protein: 85 g (1.3 g/kg)  Energy: 1800 kcal (28 kcal/kg)  Fluid: 1800 mL (1 mL/kcal)    PO Intake: no documented intake    Pt's code status adjusted to DNR, now on comfort care. NGT has been pulled, no TF. Pt is on an oral diet, recommend  liberalized as possible and food for comfort.   Will no longer follow, no nutrition interventions at this time.          EDUCATION  ? Education topics/handouts provided: none this visit  ? Barriers to learning: n/a  ? Comprehension: n/a  ? Receptivity: n/a  ? Expected compliance: n/a    NUTRITION DIAGNOSIS  Inadequate oral intake related to septic shock, dysphagia as evidenced by inability to safely swallow, need for TF.     These characteristics are NOT indicative of malnutrition at this time. However, pt at risk for malnutrition pending trend of mild characteristics.     NUTRITION INTERVENTION  PO for pleasure    MONITORING & EVALUATION  Wts, diet advancement, nutrition related labs, POC     Follow Up: will no longer follow, unless reconsulted    Pearson Forster, RD, LD  03/20/2022  10:20 AM  [facility]  Available on Vocera or [phone]

## 2022-03-21 MED ORDER — amoxicillin (AMOXIL) 250 mg/5 mL oral suspension 500 mg
250 | Freq: Three times a day (TID) | ORAL | Status: DC
Start: 2022-03-21 — End: 2022-03-22
  Administered 2022-03-21 – 2022-03-22 (×3): 250 mg via ORAL

## 2022-03-21 MED ORDER — ibuprofen (ADVIL) tablet 400 mg
400 | Freq: Four times a day (QID) | ORAL | Status: DC | PRN
Start: 2022-03-21 — End: 2022-03-22
  Administered 2022-03-21: 18:00:00 400 mg via ORAL

## 2022-03-21 MED ORDER — atropine 1 % ophthalmic solution 1-2 drop
1 | OPHTHALMIC | Status: DC | PRN
Start: 2022-03-21 — End: 2022-03-22
  Administered 2022-03-21: 16:00:00 1 [drp] via SUBLINGUAL

## 2022-03-21 NOTE — Discharge Planning (AHS/AVS) (Signed)
CM PROGRESS NOTE       Plan:  [facility]    General Overview: This CM called [employer] and they will be delivering the hospital bed, bedside table to pt's home today.  -this CM arranged gurney transport on 03/22/2022 at 1100 through [facility].  -Plan is for pt to discharge home with husband and [facility] on 03/22/2022 and [employer] will provide gurney transport home.  -This CM will continue to follow and assist with discharge needs/planning.    Bonne Dolores CM

## 2022-03-21 NOTE — Progress Notes (Signed)
Daily Progress Note  Resident Medical Team    Pt. Name/Age/DOB: Brandy [name]     66 y.o.   29-Jun-1955       Medical Record Number:   [id number]  CSN: [id number]  Date of admission:  03/07/2022  Primary Care Physician:  Bretta Bang  Admitting Physician:  Sammuel Cooper  Room: 218/218-01       Subjective     Interim events: Met with [facility] hospice and signed with plan for d/c 10/11.     This am: Feeling ok this morning, but is feeling raspy with breathing. Requesting pain medications. Excited to go home.      Objective      Current 24 Hour Min / Max      Temp    36 ?C (96.8 ?F) at 5:57 AM    No data recorded      BP     116/72 at 5:57 AM    No data recorded      HR    106 at 5:57 AM    No data recorded      RR    24 at 5:57 AM    No data recorded      Sats    SpO2: 97 % on O2 Flow Rate (L/min): 4 L/min OxyMask     No data recorded      Weight    115.2 kg (254 lb)  Body mass index is 44.99 kg/m?.5:57 AM    Admit: 103.4 kg (228 lb)       Intake/Output Summary (Last 24 hours) at 03/21/2022 0557  Last data filed at 03/21/2022 0533  Gross per 24 hour   Intake --   Output 225 ml   Net -225 ml        Physical Examination:   General: Chronically ill, deconditioned-appearing, pale, no apparent distress.   HEENT: MMM.   Pulm: Speaking in full sentences comfortably on 4L. Rales bilaterally anteriorly with trace transmitted upper airway sounds.   CV: RRR. No MRG. Pedalis pulses 2+.   Abd: Soft, no tenderness, rebound, or guarding. BS +.   MSK: Limbs well-perfused and warm. Atraumatic. Trace lower extremity edema.   Skin: Trace scattered bruising.   Neuro: No focal deficits. Moving limbs symmetrically and spontaneously. Sensation intact. CNs grossly intact.   Psych: Mood, affect, judgement normal.     Laboratory studies  Labs discontinued for comfort.     Assessment / Plan     Brandy [name]?is a 66 y.o.?female?with ductal carcinoma of the left breast with metastasis to lymph nodes undergoing chemotherapy,?who  presented 03/07/2022 for refractory septic shock due to MSSA bacteremia and endocarditis, with UTI, acute encephalopathy, acute hypoxic respiratory failure, subarachnoid hemorrhage. On 10/7, discussion of her goals, she desires to be home above all else. Hospice consulted. Transitioning to COMFORT CARE with plan for d/c to hospice on 10/11.       #Comfort care  Comfort care orders placed, plan for d/c with hospice on 10/11.  ?  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 Complicated by seeded port, removed 9/30. Negative blood cultures since 9/30.   - Amoxicillin 500 mg q8h to continue at home for suppression in line with goals of care.  ?  #RLE saphenous DVT  Developed 10/3, unable to anticoagulate due to [facility]. Also hx of HIT. CTA pulmonary negative on 10/4. Dr. Crissie Figures consulted. Planned for repeat DVT scan  for monitoring 10/9, but given patient's goals of care, will defer further work-up.       #Acute Hypoxic Respiratory Failure   No baseline O2 use. No known lung disease. Currently on 4 L.   - Continue O2 for comfort   - No O2 checks per comfort care orders  ?  #Trace?Non-Aneurysmal?Subarachnoid Hemorrhage  Seen on CT head on admission. Discussed with Neurosurgery by ED physician, gave no recommendations for management.?MRI 10/2 shows stable small SAH, no metastases. May have been from septic emboli. Maintained on seizure precautions acutely, now D/C'd.   ?  #Pancytopenia   At baseline, has iron deficiency anemia and chemotherapy-induced anemia. Hgb intermittently dropped, requiring blood transfusions, now s/p 4 upRBCs during hospitalizaiton. No longer monitoring per comfort care .    Chronic, Presumed Stable Hospital Problems  #HFrEF: Continue carvedilol.  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 Following with Dr. Nadeen Landau at [facility], currently on?Docetaxel/Trastuzumab. Metastatic to lymph node. Last treatment 02/09/2022.  Required blood transfusions due to chemotherapy.?    Feeding: ADAT  Analgesia:?APAP, morphine liquid PRN  Bowel regimen:?PRN  I - Lines/Drains/Airway:?CVC R IJ 9/26 - 10/8; NGT d/c'd  ?  CODE: DNR/DNI per multiple conversations with patient's husband?   Verbal MDPOA: Husband, [name]  DISPO: medical    Active Hospital Problems    Diagnosis SNOMED CT(R) Date Noted   . Infection associated with central venous catheter INFECTION OF VASCULAR CATHETER 03/16/2022   . 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 Subarachnoid hemorrhage (HCC) HEMORRHAGE INTO SUBARACHNOID SPACE OF NEURAXIS 03/07/2022   . Acute encephalopathy DISORDER OF BRAIN 03/07/2022   . Septic shock (HCC) SEPTIC SHOCK 03/07/2022   . Acute pyelonephritis ACUTE PYELONEPHRITIS 03/07/2022   . Acute renal failure, unspecified acute renal failure type (HCC) ACUTE KIDNEY INJURY 03/07/2022   . SAH (subarachnoid hemorrhage) (HCC) HEMORRHAGE INTO SUBARACHNOID SPACE OF NEURAXIS 03/07/2022   . Encephalopathy acute DISORDER OF BRAIN 03/07/2022   . 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 Liver lesion- small hypodense lesion around falciform lig LESION OF LIVER 01/21/2016     Sammuel Cooper, MD  03/21/22  5:57 AM     Patient was seen by and discussed with Dr. Vinson Moselle, who agrees with the above unless otherwise noted.

## 2022-03-21 NOTE — Plan of Care (Addendum)
Patient resting comfortably in bed. Pain managed with oral medication including MS. Oral care completed. Turning with pillow support PRN pt comfort and request. Foley patent. Tolerating oral intake, low appetite. Offered intake throughout shift. Patient had XL liquid BM. Allevyn changed. Nystatin applied. Call light and personal belongings in reach.         Problem: Safety  Goal: Patient will be injury free during hospitalization  Description: Assess and monitor vitals signs, neurological status including level of consciousness and orientation. Assess patient's risk for falls and implement fall prevention plan of care and interventions per hospital policy.      Ensure arm band on, uncluttered walking paths in room, adequate room lighting, call light and overbed table within reach, bed in low position, wheels locked, side rails up per policy, and non-skid footwear provided.   Outcome: Progressing     Problem: Risk for Falls  Goal: No falls during hospitalization  Description: Patient will not fall during hospitalization.  Outcome: Progressing     Problem: Knowledge Deficit  Goal: Knowledge - personal safety  Description: Patient will verbalize understanding of fall prevention.  Outcome: Progressing     Problem: Pain  Goal: Pain level within patient's desired comfort-function goal  Outcome: Progressing

## 2022-03-21 NOTE — Plan of Care (Signed)
Problem: Safety  Goal: Patient will be injury free during hospitalization  Description: Assess and monitor vitals signs, neurological status including level of consciousness and orientation. Assess patient's risk for falls and implement fall prevention plan of care and interventions per hospital policy.      Ensure arm band on, uncluttered walking paths in room, adequate room lighting, call light and overbed table within reach, bed in low position, wheels locked, side rails up per policy, and non-skid footwear provided.   Outcome: Progressing     Problem: Knowledge Deficit  Goal: Patient/family/caregiver demonstrates understanding of disease process, treatment plan, medications, and discharge instructions  Description: Complete learning assessment and assess knowledge base.  Outcome: Progressing     Problem: Knowledge Deficit  Goal: Patient/family/caregiver demonstrates understanding of disease process, treatment plan, medications, and discharge instructions  Description: Complete learning assessment and assess knowledge base.  Outcome: Progressing     Problem: Risk for Falls  Goal: No falls during hospitalization  Description: Patient will not fall during hospitalization.  Outcome: Progressing     Problem: Knowledge Deficit  Goal: Knowledge - personal safety  Description: Patient will verbalize understanding of fall prevention.  Outcome: Progressing     Problem: Pain  Goal: Patient will achieve activity goals and show improvement in function, mood, and coping  Outcome: Progressing  Goal: Pain level within patient's desired comfort-function goal  Outcome: Progressing  Goal: Patient will verbalize understanding and be an active participant in the pain management care plan  Outcome: Progressing  Goal: Patient will be able to identify strategies to reduce anxiety and improve coping  Outcome: Progressing

## 2022-03-21 NOTE — Other (Signed)
IP PT TREATMENT NOTE (NOT SEEN)    [name] DOB:  1955-09-13 / 66 y.o. Date:  03/21/2022     ASSESSMENT     No treatment provided.  Brandy [name] was not seen for the following reason:    Active comfort care     PLAN     Brandy [name] will be discharged from the IP PT caseload.           Treatment Diagnosis Surgery / # Days Post-op (if applicable)    Primary Dx: s/p Septic Shock  Treatment focus: Impaired functional mobility  Procedure(s) (LRB):  INFUSION CATHETER Removal (N/A) / 10 Days Post-Op       SUBJECTIVE   Patient going home with hospice tomorrow. Active comfort care orders will discharge PT orders in anticipation of return home tomorrow.      OBJECTIVE   NA          Session times   Start/Stop Times:   -     Total Time:   minutes      This note to serve as a Discharge Summary if Brandy [name] is discharged from the hospital or from therapy services.     Therapist:  Barkley Boards, PT, DPT

## 2022-03-22 MED ORDER — morphine 10 mg/5 mL solution
10 | ORAL | 0 refills | 28.00000 days | Status: AC | PRN
Start: 2022-03-22 — End: 2022-03-29

## 2022-03-22 MED ORDER — LORazepam (ATIVAN) 0.5 mg tablet
0.5 | ORAL_TABLET | ORAL | 0 refills | 15.00000 days | Status: DC | PRN
Start: 2022-03-22 — End: 2022-03-31

## 2022-03-22 MED ORDER — amoxicillin (AMOXIL) 250 mg/5 mL suspension
250 | Freq: Three times a day (TID) | ORAL | 0 refills | 7.00000 days | Status: DC
Start: 2022-03-22 — End: 2022-03-31

## 2022-03-22 NOTE — Discharge Summary (Signed)
DISCHARGE SUMMARY  Resident Medical Team    Pt. Name/Age/DOB: Brandy [name]     66 y.o.   03-Oct-1955       Medical Record Number:   [id number]  CSN: [id number]  Date of admission:  03/07/2022  Primary Care Physician:  Bretta Bang  Admitting Physician:  Sammuel Cooper   Room: 218/218-01    Admission Info     Admit Date  03/07/2022    Discharge Date  03/22/2022    Admitting Attending  Kathalene Frames, MD    Discharge Attending  Madolyn Frieze, MD    Discharge Diagnosis    Active Hospital Problems    Diagnosis SNOMED CT(R) Date Noted   . Infection associated with central venous catheter INFECTION OF VASCULAR CATHETER 03/16/2022   . 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 Subarachnoid hemorrhage (HCC) HEMORRHAGE INTO SUBARACHNOID SPACE OF NEURAXIS 03/07/2022   . Acute encephalopathy DISORDER OF BRAIN 03/07/2022   . Septic shock (HCC) SEPTIC SHOCK 03/07/2022   . Acute pyelonephritis ACUTE PYELONEPHRITIS 03/07/2022   . Acute renal failure, unspecified acute renal failure type (HCC) ACUTE KIDNEY INJURY 03/07/2022   . SAH (subarachnoid hemorrhage) (HCC) HEMORRHAGE INTO SUBARACHNOID SPACE OF NEURAXIS 03/07/2022   . Encephalopathy acute DISORDER OF BRAIN 03/07/2022   . 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 Liver lesion- small hypodense lesion around falciform lig LESION OF LIVER 01/21/2016       Hospital Course     Patient Summary  Brandy [name] is a 66 y.o. 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 After discussion of goals of care on 10/7, she opted to transition to comfort care and is being discharged home with hospice.    #Comfort care  Comfort care orders placed, plan for d/c with hospice on 10/11.  ?  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 Complicated by seeded port, removed 9/30. Negative blood cultures since 9/30. Amox to be continued for suppression.  - Amoxicillin 500 mg q8h to continue at home for suppression in line with goals of care.  ?  #RLE saphenous DVT  Developed 10/3, unable to anticoagulate due to [facility]. Also hx of HIT. CTA pulmonary negative on 10/4. Dr. Crissie Figures consulted. Planned for repeat DVT scan for monitoring 10/9, but given patient's goals of care, deferred further workup.  ?   #Acute Hypoxic Respiratory Failure   No baseline O2 use. No known lung disease. On 3-4 L at time of discharge.   - Continue O2 for comfort   ?  #Trace?Non-Aneurysmal?Subarachnoid Hemorrhage  Seen on CT head on admission. Discussed with Neurosurgery by ED physician, gave no recommendations for management.?MRI 10/2 showed stable small SAH, no metastases. May have been from septic emboli. Maintained on seizure precautions acutely, discontinued when transitioned to comfort care.   ?  #Pancytopenia   At baseline, has iron deficiency anemia and chemotherapy-induced anemia. Hgb intermittently dropped, requiring blood transfusions, now s/p 4 upRBCs during hospitalizaiton. No longer monitoring per comfort care.   ?  Chronic, Presumed Stable  Hospital Problems  #HFrEF: Continue carvedilol, can discontinue with hospice if in line with goals of care.   #SVT - continue home carvedilol as in line with goals of care.  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 Following with Dr. Nadeen Landau at [facility], currently on?Docetaxel/Trastuzumab. Metastatic to lymph node. Last treatment 02/09/2022. Required blood  transfusions due to chemotherapy.?Now on comfort care.    Consults  IP CONSULT TO NUTRITION SERVICES  IP CONSULT TO NUTRITION SERVICES  IP CONSULT TO DISCHARGE PLANNER  NUTRITION SERVICE FOLLOW-UP (DO NOT CANCEL UPON TRANSFER/ CHANGES BY D    Procedures  RIGHT IJ CVC PLACEMENT, removal    Key Lab Summary from Hospitalization  Lab monitoring discontinued following transition to comfort care.   Results from last 7 days   Lab Units 03/19/22  0536 03/18/22  1610 03/17/22  0615 03/16/22  0603 03/15/22  1419 03/15/22  0634   WBC 10*3/?L 2.1*  2.1* 1.7*  1.7* 1.7*  1.7* 1.9*  1.9*  --  1.8*  1.8*   RBC 10*6/?L 2.65* 2.35* 2.67* 2.74*  --  2.56*   HEMATOCRIT % 24.1* 21.2* 24.4* 24.5* 27.0* 23.7*   HGB g/dL 8.1* 7.1* 8.1* 8.3* 9.0* 7.9*   MCV fL 90.9 90.4 91.1 89.2  --  92.9   RDW % 17.9* 19.0* 19.3* 19.4*  --  19.3*   NEUTROS PCT % 69 70 67 66  --  61   LYMPHS PCT % 14* 22 23 25   --  32   EOS PCT % 3 0 3 0  --  3   BASOPHILS PCT % 1 0 1 0  --  0   PLATELETS 10*3/?L 115* 98* 90* 76*  --  84*     Results from last 7 days   Lab Units 03/19/22  0536 03/18/22  0632 03/17/22  0615 03/16/22  0603 03/15/22  0634   SODIUM mmol/L 138 137 142 139 141   POTASSIUM mmol/L 4.23 3.90 3.85 3.46* 4.07   CHLORIDE mmol/L 104.5 103.6 106.8 104.2 106.8   CO2 mmol/L 23 23 23 24 22    GLUCOSE mg/dL 960* 454* 098* 119 147*   BUN mg/dL 34* 33* 31* 33* 36*   CREATININESERUM mg/dL 8.29 5.62 1.30 8.65 7.84   CALCIUM mg/dL 7.9* 7.9* 8.2* 8.0* 7.9*   MAGNESIUM mg/dL 1.5 1.5 1.2* 1.2* 1.3   PHOSPHORUS mg/dL 3.9 3.4 3.5 3.3 2.7   ALBUMIN g/dL 2.1* 2.2* 2.4* 2.2* 2.4*   AST IU/L 26 26 32 39* 44*   ALT IU/L 17 18 21 24 24    ALK PHOS IU/L 79 81 88 94 102   BILIRUBIN TOTAL mg/dL 6.96 2.95 2.84 1.32 4.40     Results from last 7 days   Lab Units 03/17/22  0615 03/15/22  0634   C-REACTIVE PROTEIN mg/L 153.0* 120.8*     Key Imaging from Hospitalization  Echo transesophageal 03/16/22  1. The patient's left ventricular size, wall thickness and overall systolic  function appear normal.  2. Regional systolic function: Wall motion: No regional wall motion abnormalities were noted.  3. Normal right ventricular size and systolic function.  4. Mild left atrial enlargement.  5. No thrombus is seen in the left atrium or the LA appendage.  6. Spontaneous echo contrast: mild spontaneous echo contrast in the left atrial appendage.  7. There is a bioprosthetic aortic valve with evidence of a small mobile vegetation. Vegetation is small. No obvious structural damage noted to prosthetic valve  or surrounding  structure of aorta noted. There is no evidence of aortic regurgitation or aortic stenosis.  8. The mitral valve is structurally normal with trace mitral regurgitation. No evidence of a vegetation on the mitral valve.  9. The tricuspid valve appears structurally normal with mild tricuspid regurgitation. No evidence of a vegetation on the tricuspid valve.  10. Pulmonic valve appears structurally normal. No evidence of a vegetation on the pulmonic valve.    CT angiogram pulmonary  03/15/2022   IMPRESSION: 1.   There is no definite evidence of pulmonary embolism however medium or small pulmonary embolus could go undetected on the current study. 2.   There is patchy bilateral pulmonary infiltrate which more prominent on the right side and is suspicious for pneumonia. There is also bibasilar atelectasis with a suboptimal inspiratory volume 3.   There is cardiomegaly but no pulmonary edema visible.     US VENOUS LOWER EXTREMITY RIGHT DUPLEX   03/15/2022  IMPRESSION: 1. No evidence of right lower extremity DVT. 2. Suspected chronic, partially occlusive thrombus in the greater saphenous vein.    Echo 2d limited w doppler wo contrast  03/14/2022  There is a normally functioning bioprosthetic aortic valve. It is well seated and does not rock. Transvalvular gradients are within expected limits for the valve. There is no  evidence of aortic stenosis. There is no evidence of aortic  regurgitation.  The mitral valve appears structurally normal. The mitral valve has mild regurgitation.  The tricuspid valve appears structurally normal. The tricuspid valve has trace/physiological regurgitation. The RSVP is estimated at 22-27 mmHg, which is normal.  Pulmonic valve appears structurally normal. The pulmonic valve has trace regurgitation.    MRI brain with and without contrast  03/13/2022  IMPRESSION: 1. Minimal subarachnoid hemorrhage near the left frontal cortical edema. 2. No MR evidence of intracranial metastasis.    X-ray Chest 1 View  03/13/2022  IMPRESSION: 1. feeding tube is seen with the distal tip at the level of the distal esophagus.    X-ray abdomen AP    Result Date: 03/11/2022  IMPRESSION: 1. Feeding tube tip overlies the distal antrum of the stomach. NOTE: This dictation was produced using voice recognition software. Although effort has been made to minimize transcription errors, homonyms and other transcription errors may be present and may not truly reflect my intent. Please excuse any unintentional verbiage, or grammatical errors. Dictated by: Junious Dresser Electronically Signed by: Junious Dresser on 03/11/2022 4:40 PM Report ID#: [id number]     X-ray Chest 1 View  03/10/2022  EXAMINATION: XR CHEST 1 VIEW HISTORY: svt. COMPARISON: 03/07/2022 FINDINGS: LINES/TUBES:  The following lines are seen - Central line seen placed through a right jugular approach with the distal tip at the cavoatrial junction. Central line and associated port placed through a right subclavian approach with the distal tip at the right atrium. LUNGS: 1. Patchy mixed interstitial and airspace opacities bilaterally.  These appears slightly worse than 03/07/2022 differential diagnosis includes, but is not limited to, pulmonary edema, pneumonia, and ARDS. CARDIAC: Normal size cardiac silhouette. Sternal wires and evidence of prior mediastinal surgery. Aortic valve prosthesis is seen in place. MEDIASTINUM: No superior mediastinal  widening. PLEURA: Mild blunting of the right costophrenic angle. OTHER: Negative.     CT chest abdomen pelvis without IV contrast :  03/07/2022  IMPRESSION: Collapse of portions of the mid to lower lungs. Superimposed pneumonia is possible in the appropriate clinical setting.    IMPRESSION: 1.   There is  a 3 mm calculus in the urinary bladder. Given the mild left hydronephrosis, this may have recently passed into the urinary bladder from the left ureter. 2.   Urinary bladder is catheterized. Please exclude infection clinically. 3.   Hepatic steatosis.     CT head without contrast  03/07/2022  IMPRESSION: Tiny subarachnoid hemorrhage near the vertex on the right is poorly visualized compared to prior exam. No new hemorrhage. No mass effect or midline shift.     Ultrasound right upper quadrant  IMPRESSION: 1. Diffuse increased echogenicity of the liver. Differential diagnosis includes, but is not limited to, fatty infiltration, hepatitis, and cirrhosis. 2. Cholelithiasis with small floating stones in the gallbladder lumen, as well as echogenic sludge.     Ultrasound kidney  03/07/2022  IMPRESSION: 1. No solid mass or hydronephrosis seen.    Physical Exam on Day of Discharge  General: Chronically ill, deconditioned-appearing, pale, no apparent distress.   Pulm: Breathing deeply and audibly on 4 L by oximizer  CV: mild pallor, warm and well perfused extremities  MSK: Limbs well-perfused and warm. Atraumatic. Trace lower extremity edema.   Skin: Trace scattered bruising.   Neuro: Sleeping during exam.    Discharge Info     Discharge Medications     Patient's Discharge Medication List      ASK your doctor about these medications      Indications of Use   aspirin 81 mg EC tablet  Take 81 mg by mouth every morning.      atorvastatin 20 mg tablet  Quantity: 90 tablet  Commonly known as: LIPITOR  Take 1 tablet by mouth every night at bedtime.      CALCIUM 600 + D(3) ORAL  Take 1 tablet by mouth every morning.      carvediloL  6.25 mg tablet  Quantity: 180 tablet  Commonly known as: COREG  Take 1 tablet by mouth 2 times daily with meals.      cholecalciferol (vitamin D3) 50 mcg (2,000 unit) Tab tablet  Commonly known as: VITAMIN D3  Take 2,000 units by mouth every morning.      dexAMETHasone 4 mg tablet  Quantity: 24 tablet  Commonly known as: DECADRON  Take 2 tablets by mouth daily with breakfast. Take 8mg  by mouth once daily for three days starting day 2 of each cycle.      EPINEPHrine 0.3 mg/0.3 mL Atin auto-injector  Quantity: 0.6 mL  Commonly known as: EPIPEN  Inject 0.3 mL into the muscle once as needed.      ferrous gluconate 324 mg (38 mg iron) tablet  Quantity: 30 tablet  Commonly known as: FERGON  Take 1 tablet by mouth daily with breakfast.      HYDROcodone-acetaminophen 5-325 mg per tablet  Quantity: 60 tablet  Commonly known as: NORCO  Take 1 tablet by mouth every 6 hours as needed for Pain.      ondansetron 8 mg disintegrating tablet  Quantity: 30 tablet  Commonly known as: ZOFRAN-ODT  Take 1 tablet by mouth every 8 hours as needed for Nausea.      prochlorperazine 10 mg tablet  Quantity: 30 tablet  Commonly known as: COMPAZINE  Take 1 tablet by mouth every 6 hours as needed. For break through nausea/vomiting.           Disposition  Amirrah Savarese is being discharged to home with hospice in good condition with activity as tolerated and on a liberal diet as tolerated.    Patient  Instructions / Follow Up     Pending Results  None    Follow Up Instructions  Follow-up with Bretta Bang, DO as needed  Follow-up with hospice per their recommendations     Recommendations for PCP  Labs ordered for outpatient: none  Recommended follow up studies: none  Other recommendations:   - Continue amoxicillin indefinitely for endocarditis suppressoin

## 2022-03-22 NOTE — Progress Notes (Signed)
I personally saw and examined the patient. My findings concur with resident Dr.Kerns's evaluation/plan of care which we formulated together. Please see his/her note for details. If I have additions or exceptions, they will be documented in a note below.    Ok to DC home with pain management and hospice end of life care

## 2022-03-22 NOTE — Plan of Care (Signed)
Pt had a good night, required PRN morphine intervention during the night to manage pain. Rested well and was in NAD during the shift. Call light within reach at all times and uses appropriately.  Problem: Safety  Goal: Patient will be injury free during hospitalization  Description: Assess and monitor vitals signs, neurological status including level of consciousness and orientation. Assess patient's risk for falls and implement fall prevention plan of care and interventions per hospital policy.      Ensure arm band on, uncluttered walking paths in room, adequate room lighting, call light and overbed table within reach, bed in low position, wheels locked, side rails up per policy, and non-skid footwear provided.   Outcome: Progressing     Problem: Knowledge Deficit  Goal: Patient/family/caregiver demonstrates understanding of disease process, treatment plan, medications, and discharge instructions  Description: Complete learning assessment and assess knowledge base.  Outcome: Progressing     Problem: Risk for Falls  Goal: No falls during hospitalization  Description: Patient will not fall during hospitalization.  Outcome: Progressing     Problem: Knowledge Deficit  Goal: Knowledge - personal safety  Description: Patient will verbalize understanding of fall prevention.  Outcome: Progressing     Problem: Pain  Goal: Patient will achieve activity goals and show improvement in function, mood, and coping  Outcome: Progressing  Goal: Pain level within patient's desired comfort-function goal  Outcome: Progressing  Goal: Patient will verbalize understanding and be an active participant in the pain management care plan  Outcome: Progressing  Goal: Patient will be able to identify strategies to reduce anxiety and improve coping  Outcome: Progressing     Problem: [place] Braden Scale Score 18 & Lower  Goal: For scores 18 & lower:  Description: For scores 18 & lower:  Outcome: Progressing     Problem: Insufficient Fluid  Volume  Goal: Fluid and electrolyte balance are achieved/maintained  Description: Assess and monitor vital signs (orthostatic vitals if applicable), fluid intake and output, urine color, labs, skin turgor, mucous membranes, mental status, and gastrointestinal system for nausea, vomiting and diarrhea.  Monitor for signs and symptoms of hypovolemia (tachycardia, rapid breathing, decreased urine output, postural hypotension, confusion, syncope).  Collaborate with interdisciplinary team and initiate plan and interventions as ordered.  Outcome: Progressing     Problem: Hemodynamic Status  Goal: Patient's vitals signs are stable  Description: Assess and monitor patient's heart rate, rhythm, respiratory rate, peripheral pulses, capillary refill, color, body temperature, intake and output, labs and physical activity tolerance.   Observe for signs of chest pain (note location, duration, severity, radiation and associated symptoms such as diaphoresis, nausea, indigestion).  Monitor for signs and symptoms of heart failure (eg. shortness of breath, edema of feet/ankles/legs, rapid irregular heart rate, coughing, wheezing, white/pink blood tinged sputum, sudden weight gain, chest pain). Collaborate with interdisciplinary team and initiate plan and interventions as ordered.  Outcome: Progressing     Problem: Insufficient Nutritional Intake  Goal: Patient's nutritional intake is adequate  Description: Assess and monitor food intake and supplements, patient food preferences, nausea, vomiting, labs, oral cavity (gums, teeth, tongue, mucosa), proper denture fit, and cultural beliefs.  Monitor for signs of hypoglycemia and hyperglycemia.  Collaborate with interdisciplinary team and initiate plan and interventions as ordered.  Outcome: Progressing     Problem: Inadequate Mobility/Activity Status  Goal: Mobility/activity is maintained at optimum level for patient  Description: Assess and monitor patient  barriers to mobility and need  for assistive/adaptive devices. Assess patient's emotional response to  limitations. Collaborate with interdisciplinary team and initiate plans and interventions as ordered.  Outcome: Progressing     Problem: Infection  Goal: Signs and symptoms of infections are decreased or avoided  Description: Assess and monitor patient for signs and symptoms of infection such as redness, warmth, discharge, and increased body temperature. Monitor and report abnormal lab values (ex-CBC and diff, serum protein, serum albumin, and cultures).  Wash hands properly before and after each patient care activity. Utilize standard precautions and use personal protective equipment (PPE) as indicated. Ensure aseptic care of all intravenous lines and invasive tubes/drains. Obtain immunization and exposure to communicable diseases history. Collaborate with interdisciplinary team and initiate plan and interventions as ordered.  Outcome: Progressing     Problem: Compromised Skin Integrity  Goal: Skin integrity is maintained or improved  Outcome: Progressing     Problem: Potential for Infection  Goal: Remains infection free  Outcome: Progressing     Problem: Risk for Compromised Skin Integrity  Goal: Skin integrity is maintained or improved  Outcome: Progressing     Problem: Risk for Pressure, Friction, and Shear  Goal: Reduction of pressure, friction, and shear  Outcome: Progressing     Problem: Risk for Decreased Activity  Goal: Repositioning and early mobility  Outcome: Progressing     Problem: Risk for Excessive Moisture  Goal: Skin care and moisture management  Outcome: Progressing     Problem: Risk for Insufficient Nutritional Intake  Goal: Adequate nutrition status  Outcome: Progressing     Problem: Metabolic/Fluid and Electrolytes  Goal: Electrolytes maintained within normal limits  Description: INTERVENTIONS:  1. Monitor labs and assess patient for signs and symptoms of electrolyte imbalances  2. Administer electrolyte replacement as  ordered  3. Monitor response to electrolyte replacements, including repeat lab results as appropriate  4. Fluid restriction as ordered  5. Instruct patient on fluid and nutrition restrictions as appropriate  Outcome: Progressing  Goal: Hemodynamic stability and optimal renal function maintained  Description: INTERVENTIONS:  1. Monitor labs and assess for signs and symptoms of volume excess or deficit  2. Monitor intake, output and patient weight  3. Monitor urine specific gravity, serum osmolarity and serum sodium as indicated or ordered  4. Monitor response to interventions for patient's volume status, including labs, urine output, blood pressure (other measures as available)  5. Encourage oral intake as appropriate  6. Instruct patient on fluid and nutrition restrictions as appropriate  Outcome: Progressing

## 2022-03-22 NOTE — Discharge Planning (AHS/AVS) (Signed)
Transitional Care Plan & Discharge Notes     Name:Muntaha Nabihah Marolda MRM#: [id number]   Date of Birth:05-13-1956 NFA:[id number] Pamelia Hoit, DO   Admit Date: 03/07/2022 Discharge Date: 03/22/2022   Admitting Physician: Kathalene Frames, MD Admitting Diagnosis:Acute pyelonephritis [N10]  Encephalopathy acute [G93.40]  SAH (subarachnoid hemorrhage) (HCC) [I60.9]  Acute encephalopathy [G93.40]  Acute renal failure, unspecified acute renal failure type (HCC) [N17.9]  Septic shock (HCC) [A41.9, R65.21]   Readmission Risk Score: 17    Risk Factors:Potential Risk Factors: Severity of injury/illness        INTERVENTIONS     Selected Continued Care - Admitted Since 03/07/2022    No services have been selected for the patient.                      *Discharge Disposition: Home with Hospice [facility])                             Home Oxygen Provider: [employer]               LPM: 4    CASE MANAGEMENT DISCHARGE SUMMARY      Pt has decided to go home on hospice and hs signed on with [facility]. Plan is for pt to discharge home 03/22/22 with husband and [facility].  [facility] has ordered home oxygen and DME equipment and everything has been delivered.  -[facility]ni Sanger transport has been arranged through [employer] and they will pick pt up at 1100.  -Discharge orders have been faxed to [facility].  -Discharge medication is at bedside for discharge.  -Please refer to MD and Nursing notes for further information.    FOLLOW [employer]   No follow-up provider specified.        ADDITIONAL INFORMATION                                               Bonne Dolores   [facility]  Resource Management  [phone]

## 2022-03-22 NOTE — Discharge Planning (AHS/AVS) (Signed)
CM PROGRESS NOTE       Plan: ?Home with [facility]?03/22/2022  ?  General Overview: [employer] has delivered  the hospital bed, bedside table to pt's home on 03/21/2022.  -[employer] has delivered portable oxygen to hospital and is in pt's closet.  -This CM will pick pt's medications up at the outpatient pharmacy and deliver to pt's RN.  -Plan is for pt to discharge home with [facility] and [facility] and [facility] will provide gurney transport home.  -This CM will continue to follow and assist with discharge needs/planning.  ?  Bonne Dolores CM  ?

## 2022-03-22 NOTE — Progress Notes (Signed)
BP 116/72 (BP Location: Left arm, Patient Position: Lying)   Pulse 106   Temp 36 ?C (96.8 ?F) (Temporal)   Resp 24   Ht 160 cm (63)   Wt 115.2 kg (254 lb)   LMP  (LMP Unknown)   SpO2 97%   BMI 44.99 kg/m?     Pt discharged at 1130 with [facility] transport. All education provided.  All personal belongings sent with patient.  Hospice is following upon discharge home.  All pt medication prescriptions for discharge sent with patient.  Foley in place per hospice request.  3L O2 with patient own O2 canister administered upon discharge.  No further needs required.     Pt denies pain upon discharge.  She is a/o.   Problem: Safety  Goal: Patient will be injury free during hospitalization  Description: Assess and monitor vitals signs, neurological status including level of consciousness and orientation. Assess patient's risk for falls and implement fall prevention plan of care and interventions per hospital policy.      Ensure arm band on, uncluttered walking paths in room, adequate room lighting, call light and overbed table within reach, bed in low position, wheels locked, side rails up per policy, and non-skid footwear provided.   Outcome: Completed     Problem: Knowledge Deficit  Goal: Patient/family/caregiver demonstrates understanding of disease process, treatment plan, medications, and discharge instructions  Description: Complete learning assessment and assess knowledge base.  Outcome: Completed     Problem: Risk for Falls  Goal: No falls during hospitalization  Description: Patient will not fall during hospitalization.  Outcome: Completed     Problem: Knowledge Deficit  Goal: Knowledge - personal safety  Description: Patient will verbalize understanding of fall prevention.  Outcome: Completed     Problem: Pain  Goal: Patient will achieve activity goals and show improvement in function, mood, and coping  Outcome: Completed  Goal: Pain level within patient's desired comfort-function goal  Outcome: Completed  Goal:  Patient will verbalize understanding and be an active participant in the pain management care plan  Outcome: Completed  Goal: Patient will be able to identify strategies to reduce anxiety and improve coping  Outcome: Completed     Problem: [facility] Braden Scale Score 18 & Lower  Goal: For scores 18 & lower:  Description: For scores 18 & lower:  Outcome: Completed     Problem: Insufficient Fluid Volume  Goal: Fluid and electrolyte balance are achieved/maintained  Description: Assess and monitor vital signs (orthostatic vitals if applicable), fluid intake and output, urine color, labs, skin turgor, mucous membranes, mental status, and gastrointestinal system for nausea, vomiting and diarrhea.  Monitor for signs and symptoms of hypovolemia (tachycardia, rapid breathing, decreased urine output, postural hypotension, confusion, syncope).  Collaborate with interdisciplinary team and initiate plan and interventions as ordered.  Outcome: Completed     Problem: Hemodynamic Status  Goal: Patient's vitals signs are stable  Description: Assess and monitor patient's heart rate, rhythm, respiratory rate, peripheral pulses, capillary refill, color, body temperature, intake and output, labs and physical activity tolerance.   Observe for signs of chest pain (note location, duration, severity, radiation and associated symptoms such as diaphoresis, nausea, indigestion).  Monitor for signs and symptoms of heart failure (eg. shortness of breath, edema of feet/ankles/legs, rapid irregular heart rate, coughing, wheezing, white/pink blood tinged sputum, sudden weight gain, chest pain). Collaborate with interdisciplinary team and initiate plan and interventions as ordered.  Outcome: Completed     Problem: Insufficient Nutritional Intake  Goal:  Patient's nutritional intake is adequate  Description: Assess and monitor food intake and supplements, patient food preferences, nausea, vomiting, labs, oral cavity (gums, teeth, tongue, mucosa),  proper denture fit, and cultural beliefs.  Monitor for signs of hypoglycemia and hyperglycemia.  Collaborate with interdisciplinary team and initiate plan and interventions as ordered.  Outcome: Completed     Problem: Inadequate Mobility/Activity Status  Goal: Mobility/activity is maintained at optimum level for patient  Description: Assess and monitor patient  barriers to mobility and need for assistive/adaptive devices. Assess patient's emotional response to limitations. Collaborate with interdisciplinary team and initiate plans and interventions as ordered.  Outcome: Completed     Problem: Infection  Goal: Signs and symptoms of infections are decreased or avoided  Description: Assess and monitor patient for signs and symptoms of infection such as redness, warmth, discharge, and increased body temperature. Monitor and report abnormal lab values (ex-CBC and diff, serum protein, serum albumin, and cultures).  Wash hands properly before and after each patient care activity. Utilize standard precautions and use personal protective equipment (PPE) as indicated. Ensure aseptic care of all intravenous lines and invasive tubes/drains. Obtain immunization and exposure to communicable diseases history. Collaborate with interdisciplinary team and initiate plan and interventions as ordered.  Outcome: Completed     Problem: Compromised Skin Integrity  Goal: Skin integrity is maintained or improved  Outcome: Completed     Problem: Potential for Infection  Goal: Remains infection free  Outcome: Completed     Problem: Risk for Compromised Skin Integrity  Goal: Skin integrity is maintained or improved  Outcome: Completed     Problem: Risk for Pressure, Friction, and Shear  Goal: Reduction of pressure, friction, and shear  Outcome: Completed     Problem: Risk for Decreased Activity  Goal: Repositioning and early mobility  Outcome: Completed     Problem: Risk for Excessive Moisture  Goal: Skin care and moisture management  Outcome:  Completed     Problem: Risk for Insufficient Nutritional Intake  Goal: Adequate nutrition status  Outcome: Completed     Problem: Metabolic/Fluid and Electrolytes  Goal: Electrolytes maintained within normal limits  Description: INTERVENTIONS:  1. Monitor labs and assess patient for signs and symptoms of electrolyte imbalances  2. Administer electrolyte replacement as ordered  3. Monitor response to electrolyte replacements, including repeat lab results as appropriate  4. Fluid restriction as ordered  5. Instruct patient on fluid and nutrition restrictions as appropriate  Outcome: Completed  Goal: Hemodynamic stability and optimal renal function maintained  Description: INTERVENTIONS:  1. Monitor labs and assess for signs and symptoms of volume excess or deficit  2. Monitor intake, output and patient weight  3. Monitor urine specific gravity, serum osmolarity and serum sodium as indicated or ordered  4. Monitor response to interventions for patient's volume status, including labs, urine output, blood pressure (other measures as available)  5. Encourage oral intake as appropriate  6. Instruct patient on fluid and nutrition restrictions as appropriate  Outcome: Completed

## 2022-03-23 NOTE — Progress Notes (Signed)
Hospital Follow up Care Coordination Phone Call    PCP:Emily Mountz, DO    Date of WUJ:WJXBJion:03/07/2022   Reason for Admission: Septic shock    Date of Discharge: 03/22/2022  Discharged to:Home with hospice.     ChaYN:WGNFeview shows pt was discharged home with Hospice Care, hospital f/u coordination not needed at this time.             Follow up appointment, (patient reminded to bring discharge paperwork and all medication bottles/packages to appointment):   No future appointments.       [facility] RN

## 2022-03-24 LAB — CLOSTRIDIUM DIFFICILE BY PCR: Clostridium difficile by NAAT PCR: NEGATIVE

## 2022-04-12 DEATH — deceased

## 2022-07-27 IMAGING — MR MRI LUMBAR SPINE WITHOUT CONTRAST
6 of 9 series · 15 of 48 positions shown · IV contrast (gadolinium)
Comparison: MRI of the lumbar spine from July 25, 2021.

________________________________________________________________________________________________ 
MRI LUMBAR SPINE WITHOUT CONTRAST, 07/27/2022 [DATE]: 
CLINICAL INDICATION: Low back pain.
TECHNIQUE: Multiplanar, multiecho position MR images of the lumbar spine were 
performed without intravenous gadolinium enhancement. Patient was scanned on a 
3T magnet.

[Series 101: survey · axial · 10.0mm · 1.39mm/px · 1 of 9 slices shown]
[im 1/9]
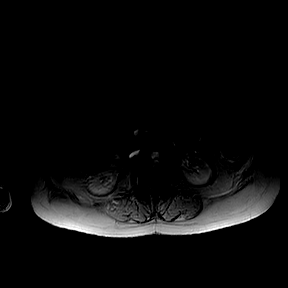

[Series 201: t2w_cor-surv · coronal · 6.0mm · 0.50mm/px · 1 of 5 slices shown]
[im 1/5]
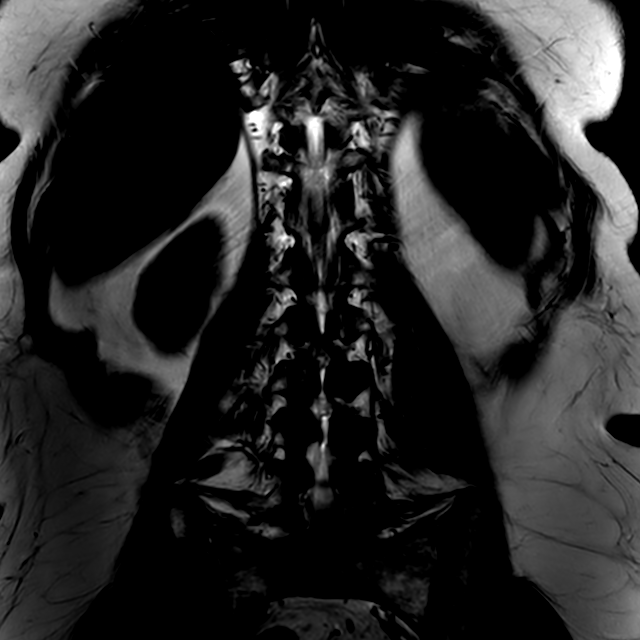

[Series 301: t1w_tse sag · sagittal · 4.0mm · 0.26mm/px · 2 of 17 slices shown]
[im 1/17]
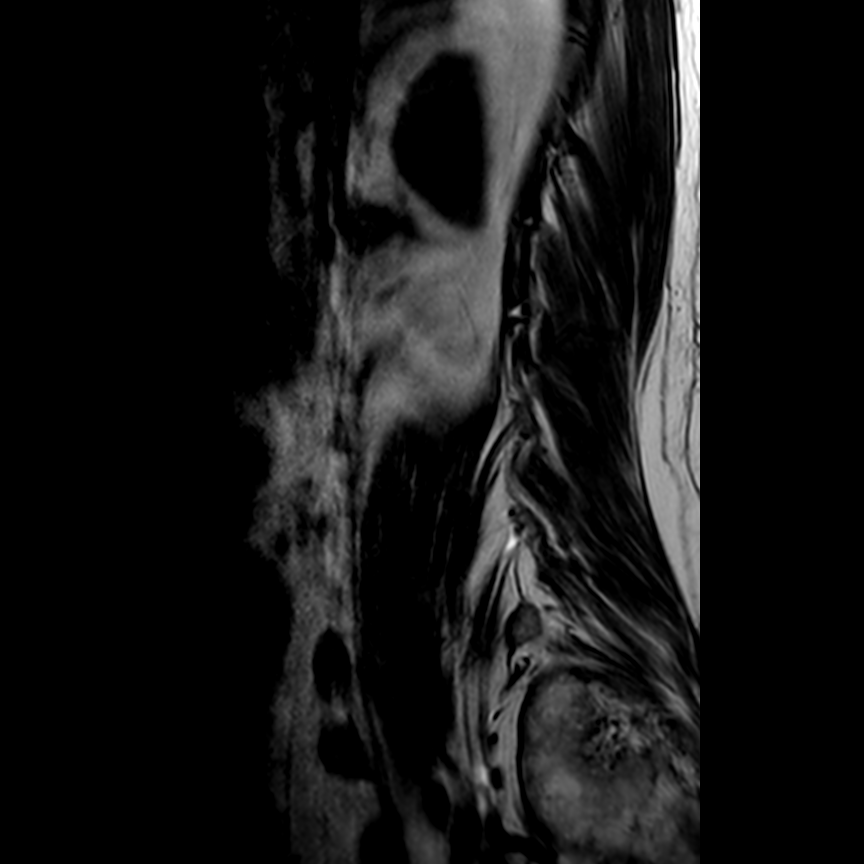
[im 17/17]
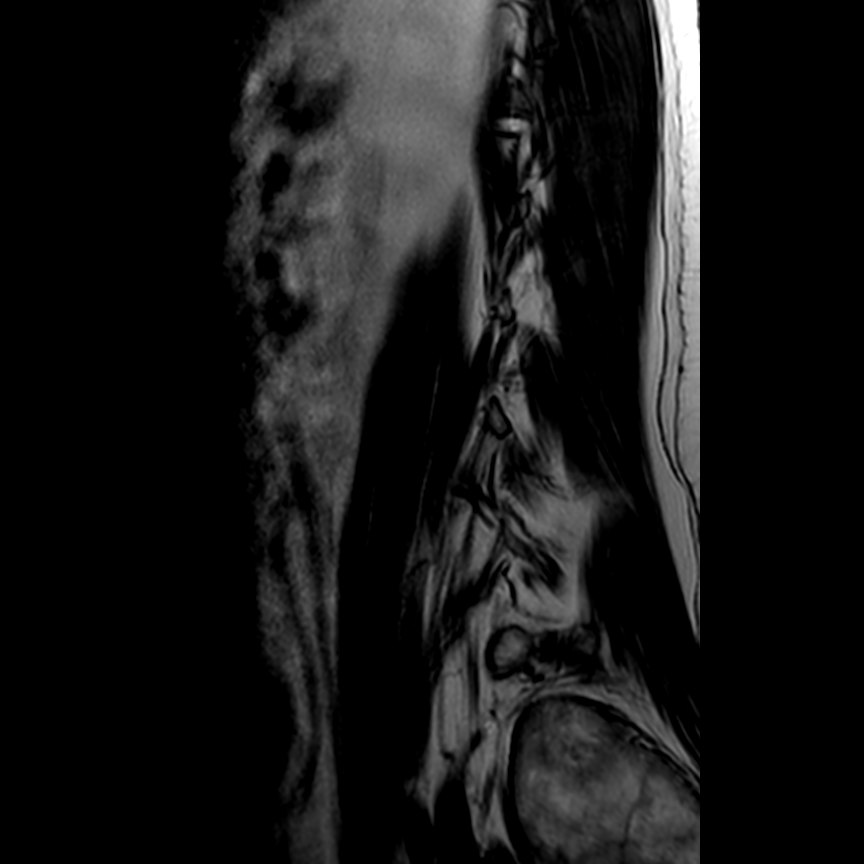

[Series 402: sst2 sag/fs · sagittal · 4.0mm · 0.47mm/px · 2 of 17 slices shown]
[im 1/17]
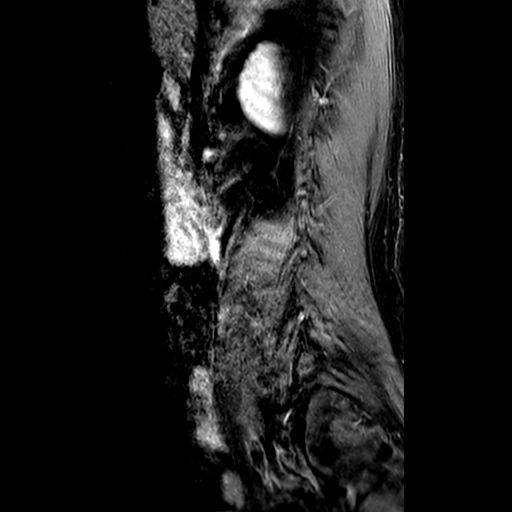
[im 17/17]
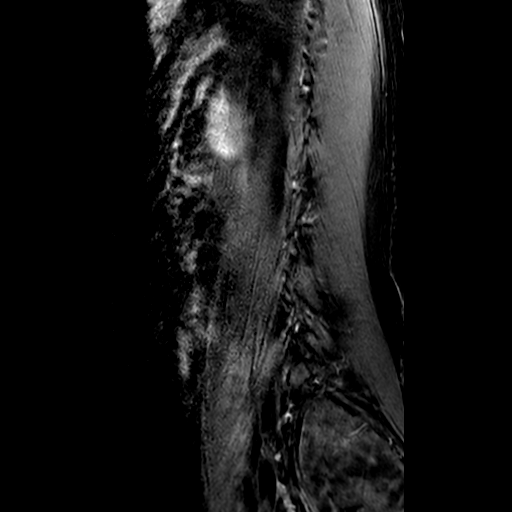

[Series 403: st2 sag · sagittal · 4.0mm · 0.47mm/px · 2 of 17 slices shown]
[im 1/17]
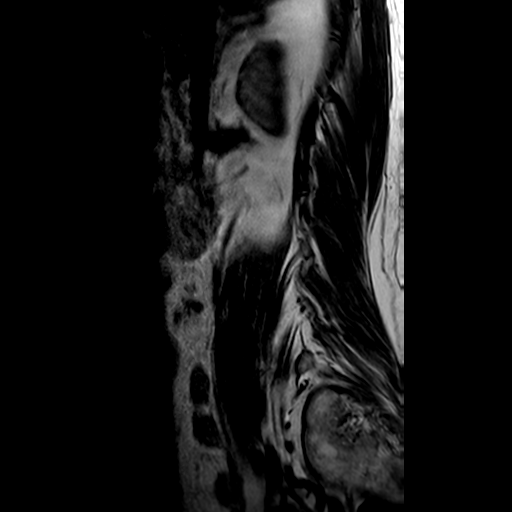
[im 17/17]
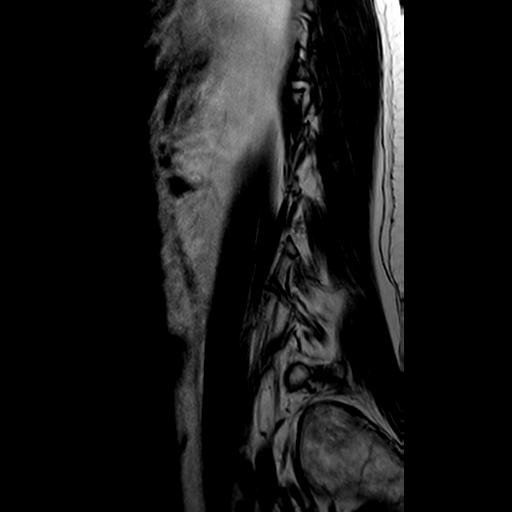

[Series 501: 3d_spine_view_t2w sag · sagittal · 1.5mm · 0.47mm/px · 7 of 107 slices shown]
[im 1/107]
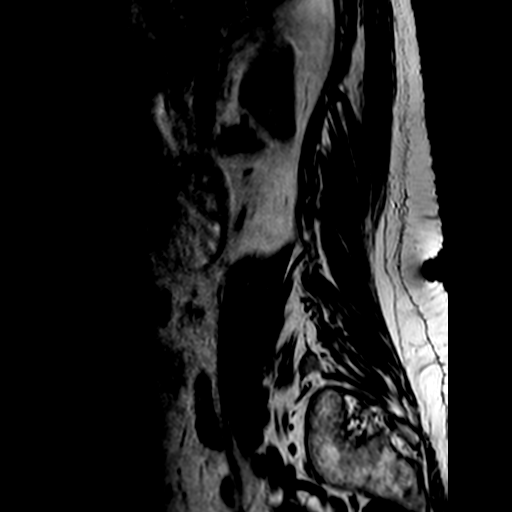
[im 20/107]
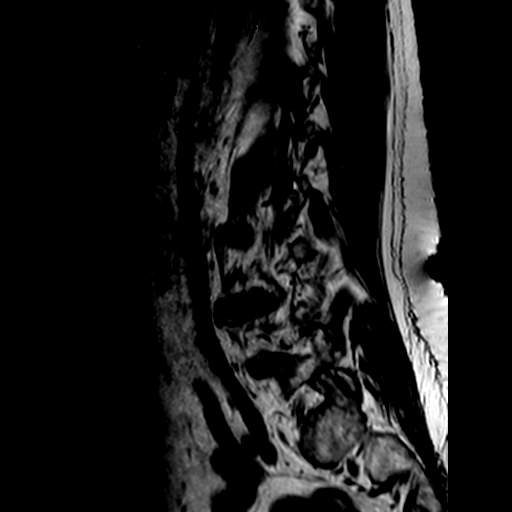
[im 29/107]
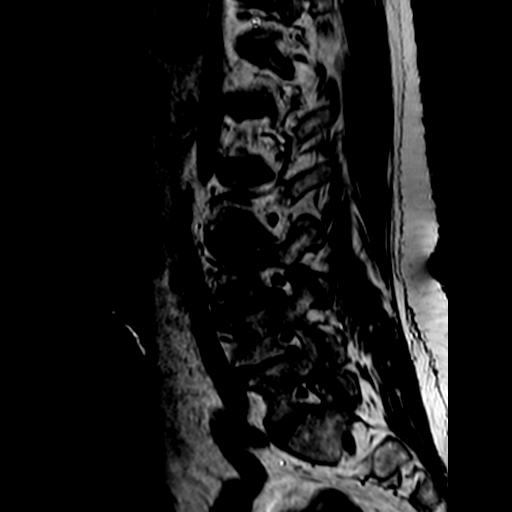
[im 49/107]
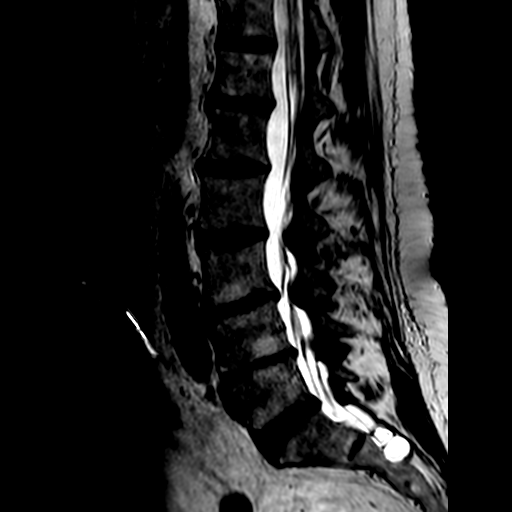
[im 58/107]
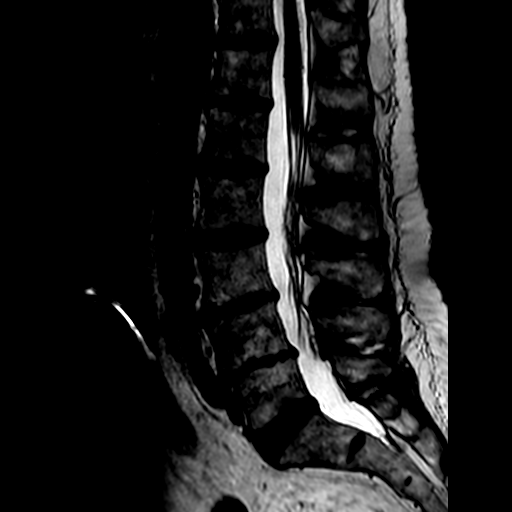
[im 78/107]
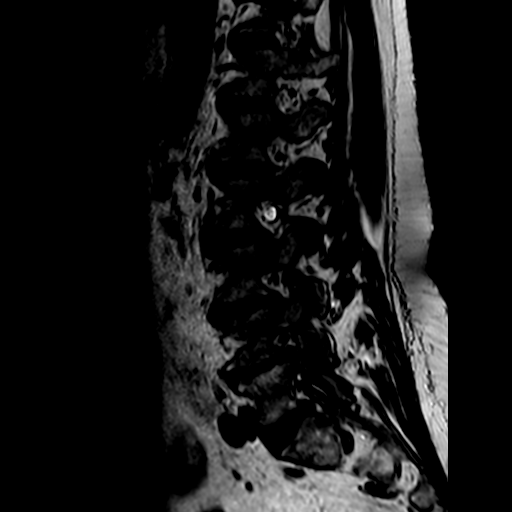
[im 87/107]
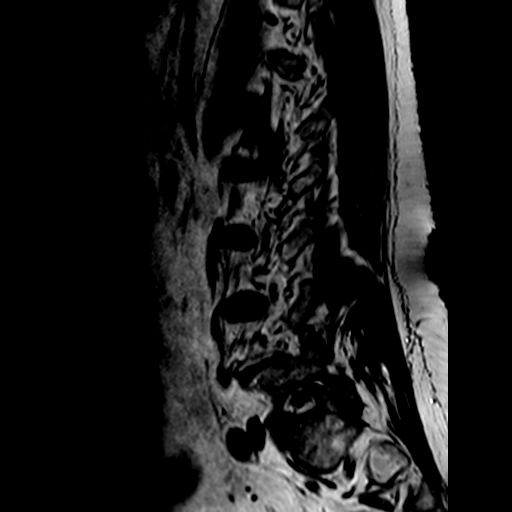

[15 of 48 positions shown; findings below may reference images not displayed]

FINDINGS: -------------------------------------------------------------------------------- 
------ 
GENERAL: 
Nomenclature is based on 5 lumbar type vertebral bodies.     
ALIGNMENT: Minimal retrolisthesis of T12 on L1, L1-L2, L2 on L3. 
VERTEBRAL BODY HEIGHT: Normal.  
MARROW SIGNAL: No focal suspect signal abnormality. 
CORD SIGNAL: Normal distal spinal cord and cauda equina.  Conus terminates at L1 
inferior endplate level. Sacral Tarlov cysts. 
ADDITIONAL FINDINGS: Splenomegaly is again noted.. 
Modic I-II: L3-L4, L4-L5 
Ligamentum Flavum > 2.5 mm: All levels. 
-------------------------------------------------------------------------------- 
------ 
SEGMENTAL: 
T12-L1: Trace disc bulge. No significant central canal narrowing.  No 
significant right neural foraminal narrowing. No significant left neural 
foraminal narrowing.  
L1-L2: Disc bulge. No significant central canal narrowing. Mild left greater 
than right subarticular recess narrowing.  No significant right neural foraminal 
narrowing. No significant left neural foraminal narrowing.  
L2-L3: Disc bulge with bilateral facet hypertrophy. No significant central canal 
narrowing. Mild bilateral subarticular recess narrowing.  No significant right 
neural foraminal narrowing. No significant left neural foraminal narrowing.  
L3-L4: Disc bulge with bilateral facet and ligamentum flavum hypertrophy. Mild 
central canal and bilateral subarticular recess narrowing.  Moderate right 
neural foraminal narrowing. Mild left neural foraminal narrowing.  
L4-L5: Loss of disc height with disc bulge. Bilateral facet hypertrophy. Mild 
central canal narrowing. Moderate left greater than right subarticular recess 
narrowing.  Mild right neural foraminal narrowing. Moderate left neural 
foraminal narrowing.  
L5-S1: Disc bulge with bilateral facet hypertrophy. No significant central canal 
narrowing. Mild bilateral subarticular recess narrowing.  Mild right neural 
foraminal narrowing. Moderate left neural foraminal narrowing.  
-------------------------------------------------------------------------------- 
------ 
CHANGES FROM PRIOR: 
No significant change in segmental analysis the prior MRI. 
-------------------------------------------------------------------------------- 
------
IMPRESSION: 1.  Discogenic/degenerative changes as above. 
2.  No significant change in segmental analysis compared to prior MRI. 
3.  No severe central canal narrowing at any level. 
4.  No severe neural foraminal narrowing at any level. 
5.  Stable splenomegaly.

## 2023-07-25 IMAGING — MR MRI CERVICAL SPINE WITHOUT CONTRAST
7 of 12 series · 11 of 48 positions shown · IV contrast (gadolinium)
Comparison: None.

________________________________________________________________________________________________ 
MRI CERVICAL SPINE WITHOUT CONTRAST, 07/25/2023 [DATE]: 
CLINICAL INDICATION: Chronic neck pain. History of surgery.
TECHNIQUE: Multiplanar, multiecho position MR images of the cervical spine were 
performed without intravenous gadolinium enhancement. Patient was scanned on a 
1.5T magnet.

[Series 101: survey · axial · 10.0mm · 1.25mm/px · 1 of 10 slices shown]
[im 1/10]
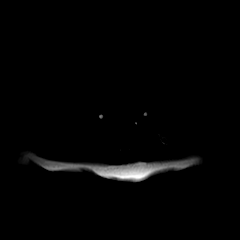

[Series 201: t2w_cor-surv · coronal · 5.0mm · 0.69mm/px · 1 of 7 slices shown]
[im 1/7]
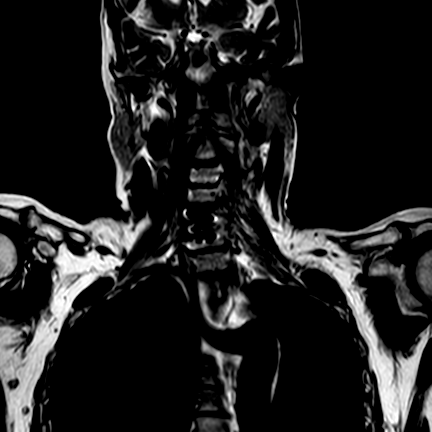

[Series 301: T1 · sagittal · 3.0mm · 0.39mm/px · 1 of 19 slices shown]
[im 1/19]
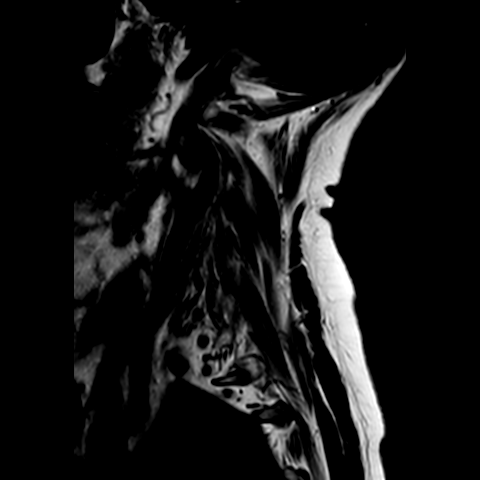

[Series 402: (id)_mdixon_tse · sagittal · 3.0mm · 0.32mm/px · 2 of 19 slices shown]
[im 1/19]
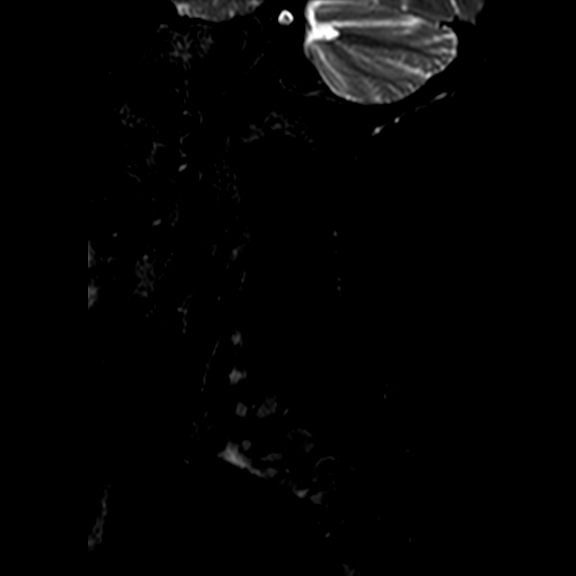
[im 19/19]
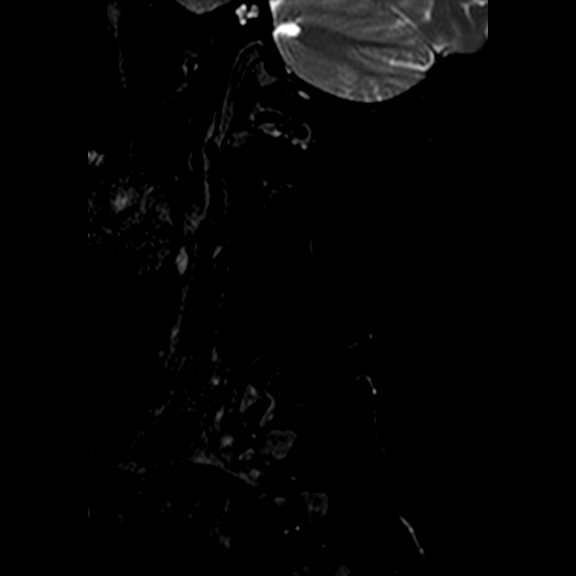

[Series 403: st2w_mdixon_tse · sagittal · 3.0mm · 0.32mm/px · 2 of 19 slices shown]
[im 1/19]
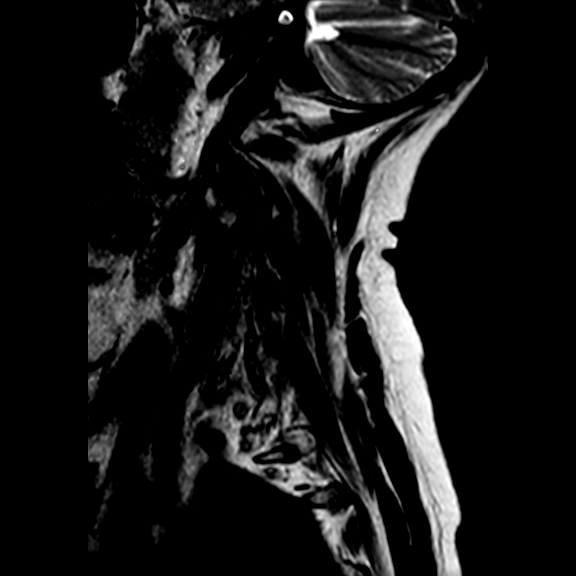
[im 19/19]
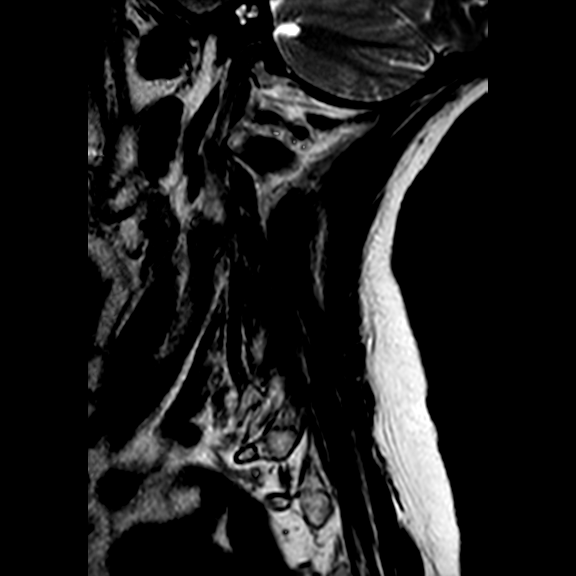

[Series 504: csp left oblq · oblique · 1.0mm · 0.15mm/px · 2 of 36 slices shown]
[im 1/36]
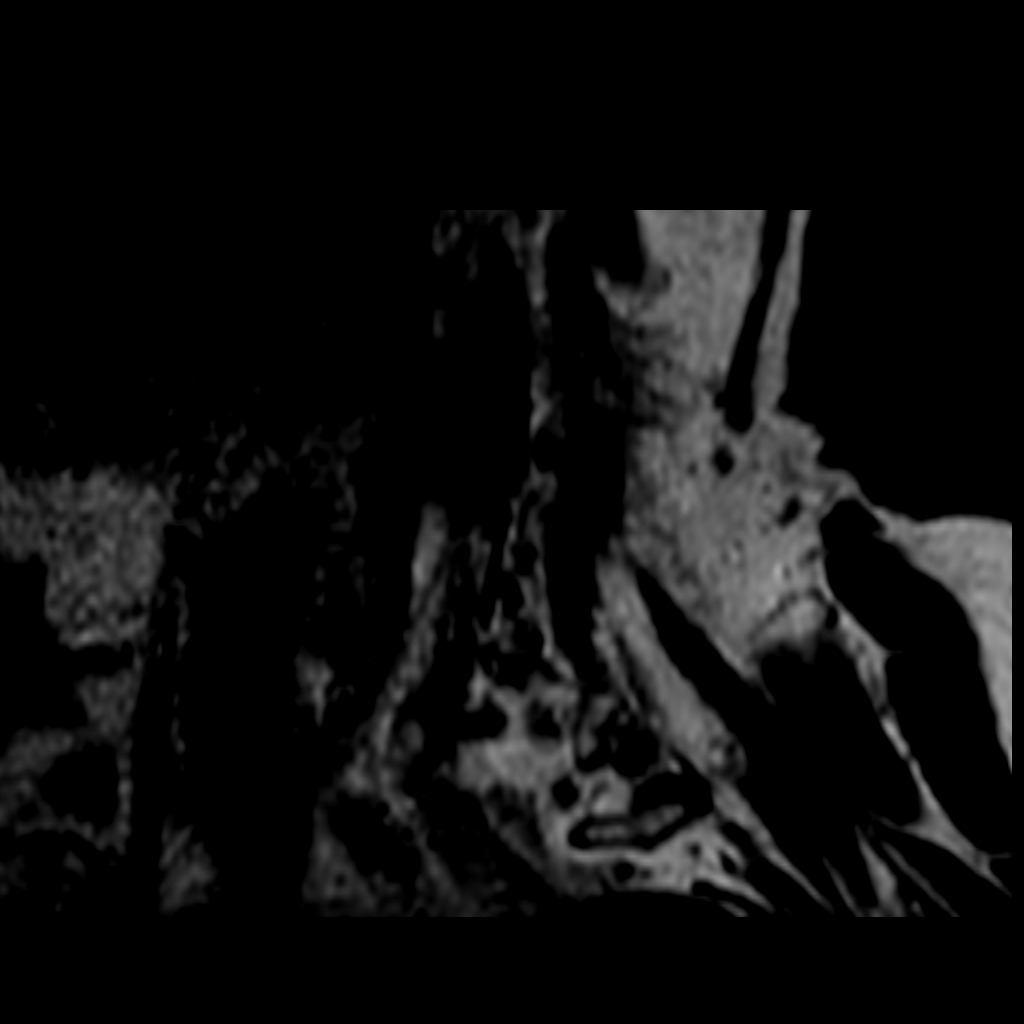
[im 18/36]
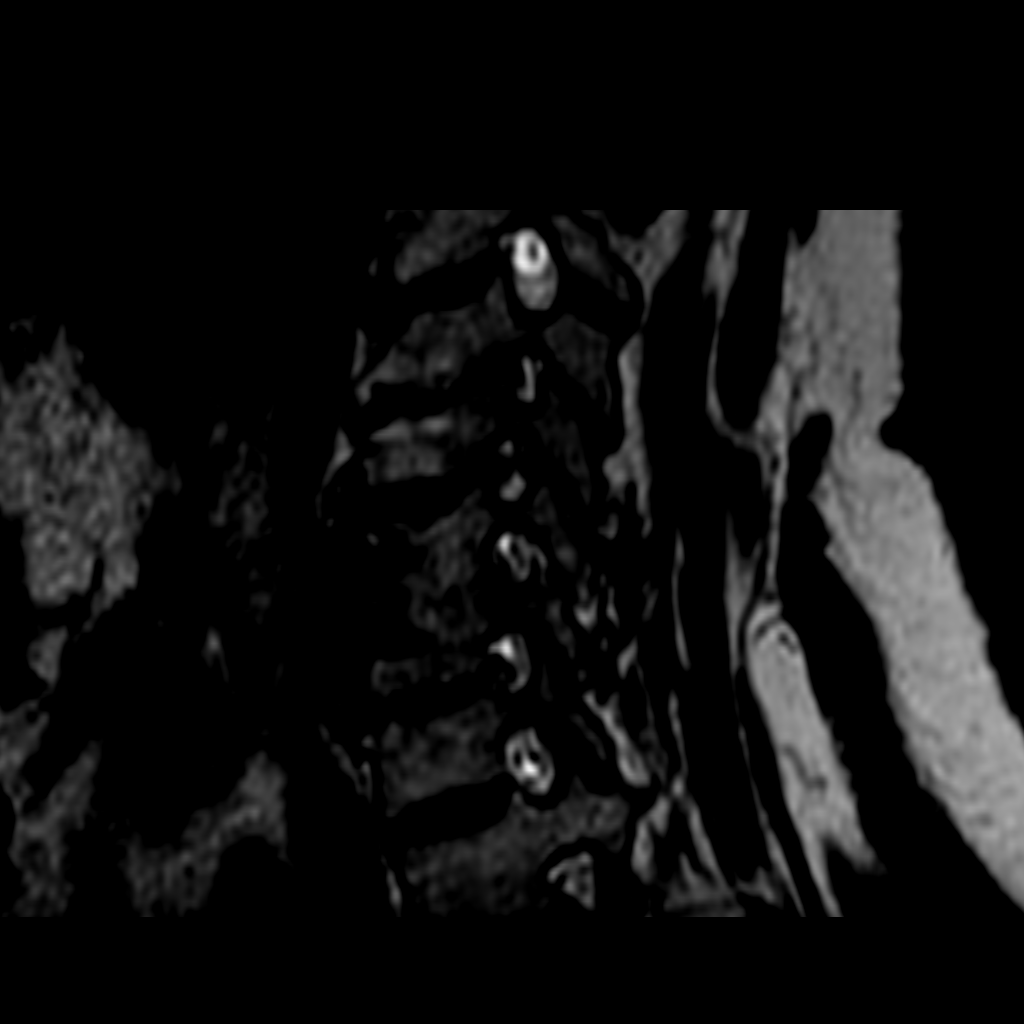

[Series 601: T2 · oblique · 3.0mm · 0.29mm/px · 2 of 18 slices shown]
[im 1/18]
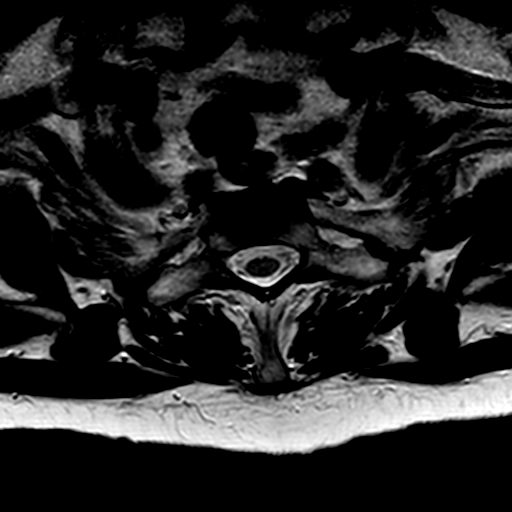
[im 18/18]
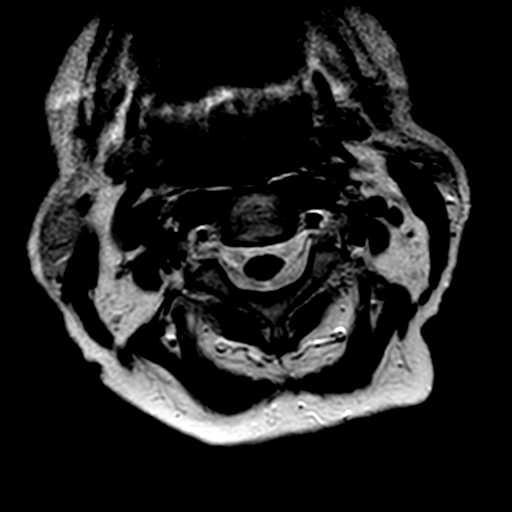

[11 of 48 positions shown; findings below may reference images not displayed]

FINDINGS: -------------------------------------------------------------------------------- 
----------------- 
GENERAL: 
POSTSURGICAL: Status post anterior cervical discectomy and fusion at C5-C6. 
ALIGNMENT: Mild retrolisthesis of C3 on C4, C4 on C5. 
VERTEBRAL BODY HEIGHT: Normal.  
MARROW SIGNAL: No focal suspect signal abnormality. 
CORD SIGNAL: Normal.  
ADDITIONAL FINDINGS: None. 
-------------------------------------------------------------------------------- 
---------------- 
SEGMENTAL: 
CRANIOCERVICAL JUNCTION: No significant stenosis. 
C2-C3: Very mild disc osteophyte complex; no significant central canal 
narrowing.  No significant right neural foraminal narrowing. No significant left 
neural foraminal narrowing.  
C3-C4: Disc osteophyte complex eccentric to the left, left uncovertebral joint 
hypertrophy; mild deformity of the left ventral cord with mild central canal 
narrowing.  No significant right neural foraminal narrowing. Mild left neural 
foraminal narrowing.  
C4-C5: Disc osteophyte complex, left uncovertebral joint hypertrophy; mild 
central canal narrowing.  No significant right neural foraminal narrowing. Mild 
left neural foraminal narrowing.  
C5-C6: Fusion level, no significant central canal narrowing. Fusion across left 
uncovertebral joint hypertrophy. No significant right neural foraminal 
narrowing. Mild left neural foraminal narrowing.  
C6-C7: Disc osteophyte complex with right uncovertebral joint hypertrophy 
greater than left; no significant central canal narrowing.  Severe right neural 
foraminal narrowing. Mild left neural foraminal narrowing.  
C7-T1: No significant central canal narrowing.  No significant right neural 
foraminal narrowing. No significant left neural foraminal narrowing.  
-------------------------------------------------------------------------------- 
---------------
IMPRESSION: 1.  Discogenic/degenerative changes as above. 
2.  Cord signal abnormality: None. 
3.  Cord deformity: C3-C4 
4.  Severe neural foraminal narrowing: C6-C7 (right), please correlate for right 
C7 radiculopathy.

## 2023-07-25 IMAGING — MR MRI LUMBAR SPINE WITHOUT CONTRAST
6 of 9 series · 14 of 48 positions shown · IV contrast (gadolinium)
Comparison: MRI lumbar spine from July 27, 2022.

________________________________________________________________________________________________ 
MRI LUMBAR SPINE WITHOUT CONTRAST, 07/25/2023 [DATE]: 
CLINICAL INDICATION: Chronic low back pain with leg weakness and unsteady gait.
TECHNIQUE: Multiplanar, multiecho position MR images of the lumbar spine were 
performed without intravenous gadolinium enhancement. Patient was scanned on a 
1.5T magnet

[Series 101: survey · axial · 10.0mm · 1.25mm/px · z∈[-33,+201]mm · 2 of 10 slices shown (1 of 2)]
[im 1/10]
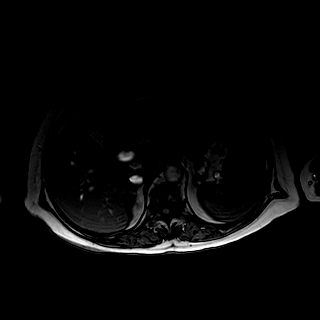
[im 10/10]
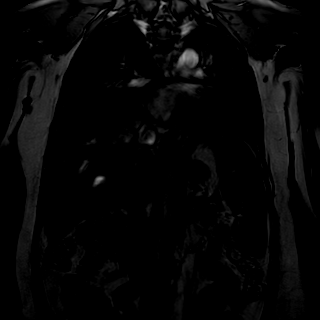

[Series 201: survey · axial · 10.0mm · 1.25mm/px · 1 of 10 slices shown (2 of 2)]
[im 1/10]
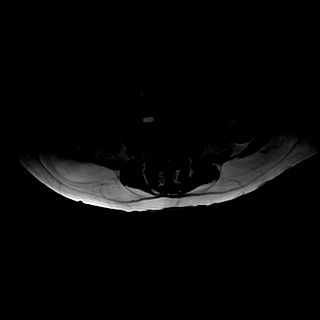

[Series 301: t2w_cor-surv · coronal · 6.0mm · 0.62mm/px · 2 of 14 slices shown]
[im 1/14]
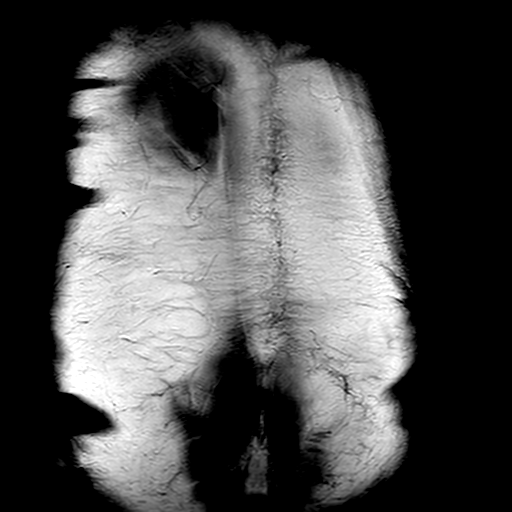
[im 14/14]
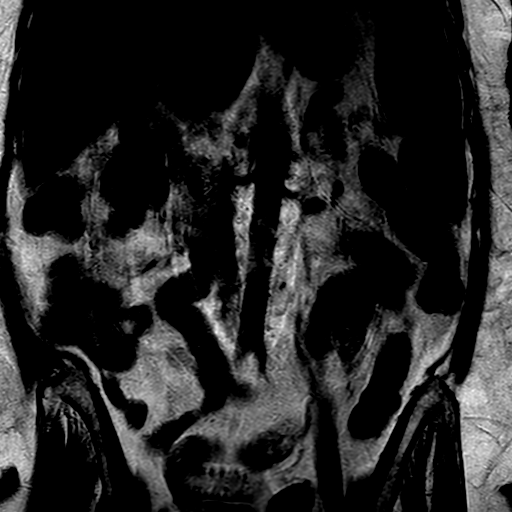

[Series 401: t1_tse_sag · sagittal · 4.0mm · 0.34mm/px · 3 of 19 slices shown]
[im 1/19]
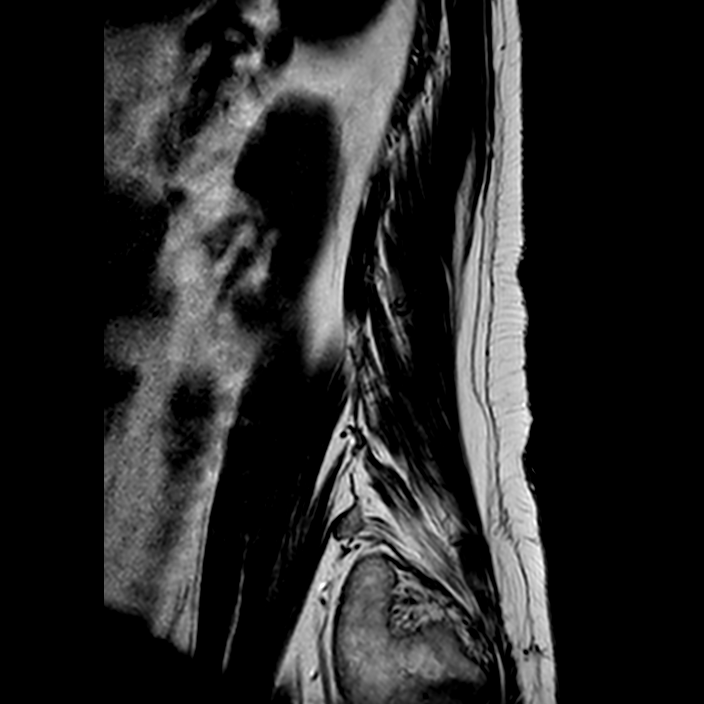
[im 10/19]
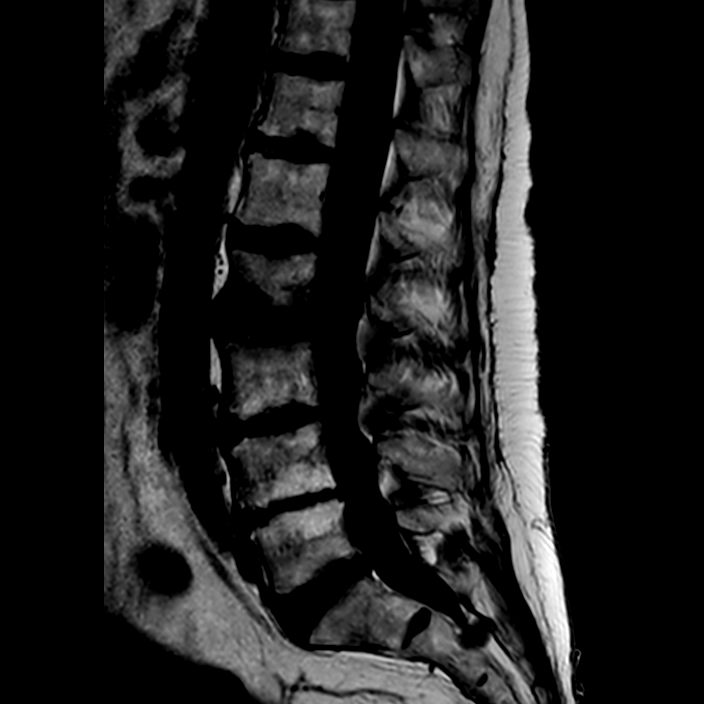
[im 19/19]
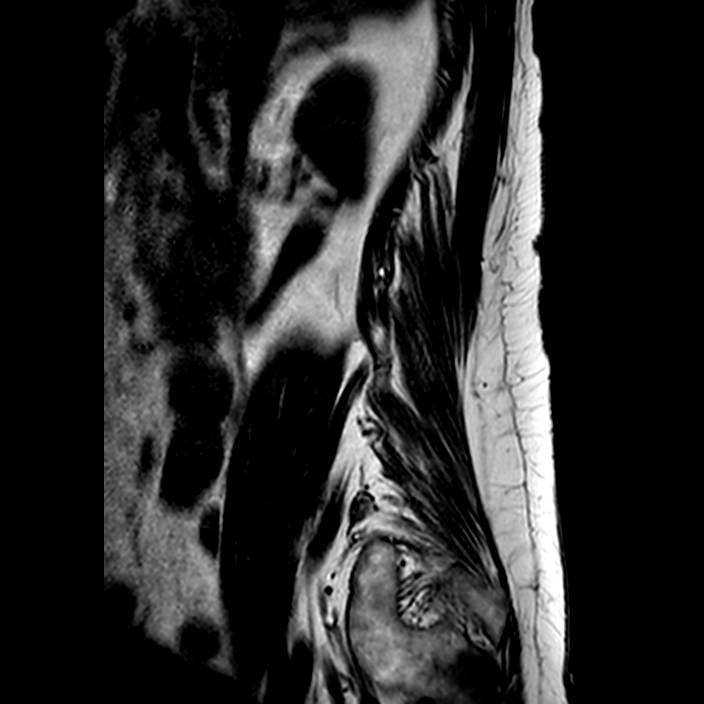

[Series 502: (id)_mdixon_tse · sagittal · 4.0mm · 0.51mm/px · 3 of 19 slices shown]
[im 1/19]
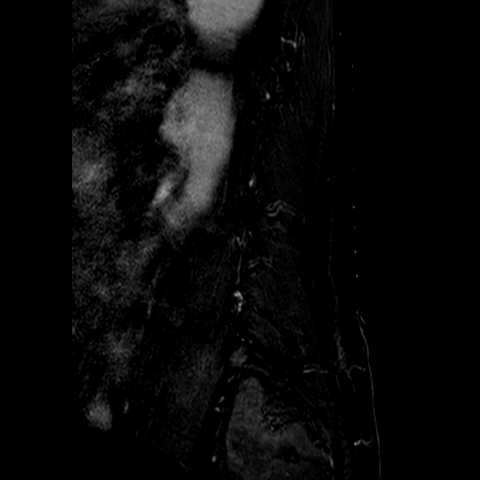
[im 10/19]
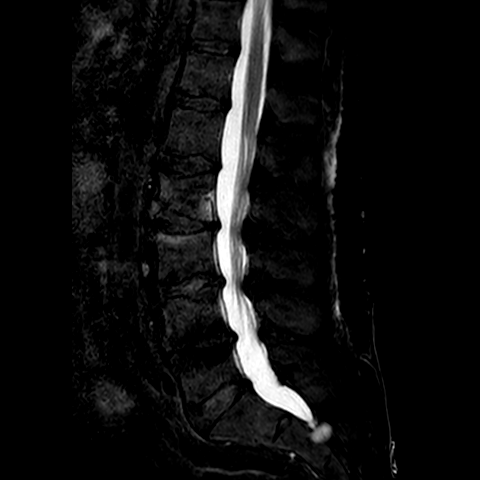
[im 19/19]
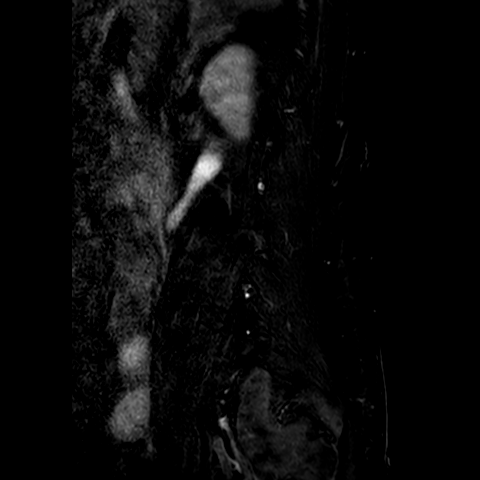

[Series 503: st2w_mdixon_tse · sagittal · 4.0mm · 0.51mm/px · 3 of 19 slices shown]
[im 1/19]
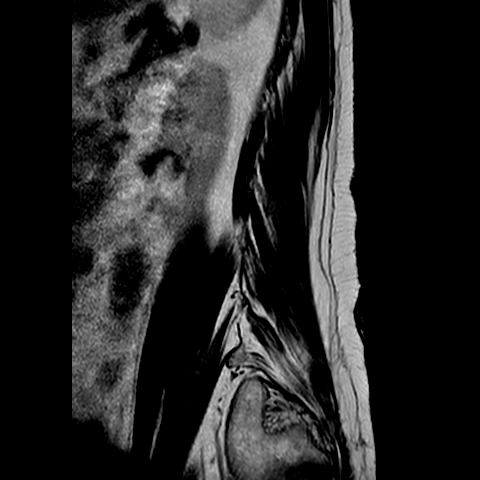
[im 10/19]
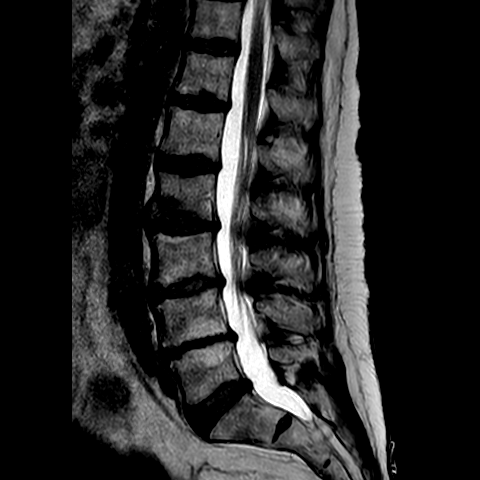
[im 19/19]
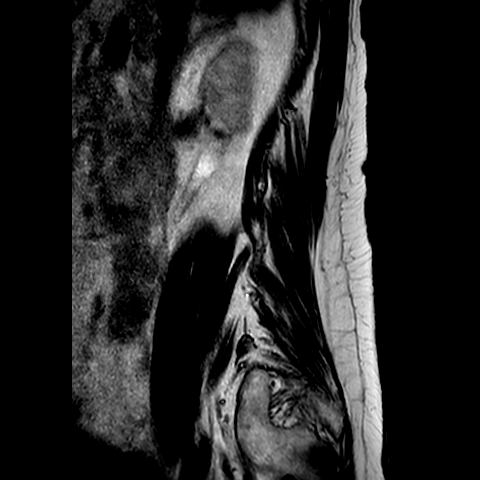

[14 of 48 positions shown; findings below may reference images not displayed]

FINDINGS: -------------------------------------------------------------------------------- 
------ 
GENERAL: 
Nomenclature is based on 5 lumbar type vertebral bodies.     
ALIGNMENT: Mild retrolisthesis of L2 on L3. 
VERTEBRAL BODY HEIGHT: Compression fracture of L2 inferior endplate with 
associated Schmorls node, mild loss of height and edema in the vertebral body 
from acuity of the fracture. Mild compression fracture of L3 superior endplate 
with mild edema from acuity of the fracture.  
MARROW SIGNAL: No focal suspect signal abnormality. 
CORD SIGNAL: Normal distal spinal cord and cauda equina.  Conus terminates at 
L1. 
ADDITIONAL FINDINGS: None. 
Modic I-II: Modic change at L3-L4, L4-L5, L5-S1. 
Ligamentum Flavum > 2.5 mm: All levels. 
-------------------------------------------------------------------------------- 
------ 
SEGMENTAL: 
T12-L1: Trace disc bulge; no significant central canal narrowing.  No 
significant right neural foraminal narrowing. No significant left neural 
foraminal narrowing.  
L1-L2: Disc bulge; no significant central canal narrowing.  No significant right 
neural foraminal narrowing. No significant left neural foraminal narrowing.  
L2-L3: Disc bulge with bilateral facet/ligamentum flavum hypertrophy; no 
significant central canal narrowing, mild bilateral subarticular recess 
narrowing.  No significant right neural foraminal narrowing. No significant left 
neural foraminal narrowing. Progression of central canal and bilateral 
subarticular recess narrowing. 
L3-L4: Disc bulge, bilateral facet/ligamentum flavum hypertrophy; mild central 
canal narrowing with associated bilateral subarticular recess narrowing.  Mild 
right neural foraminal narrowing. Mild left neural foraminal narrowing.  
L4-L5: Severe loss of disc height with disc bulge, bilateral facet/ligamentum 
flavum hypertrophy; no significant central canal narrowing, moderate left and 
mild right subarticular recess narrowing.  Mild right neural foraminal 
narrowing. Mild left neural foraminal narrowing.  
L5-S1: Disc bulge with right subarticular disc herniation, bilateral facet 
hypertrophy; no significant central canal narrowing, severe right subarticular 
recess narrowing and mild left subarticular recess narrowing.  Mild right neural 
foraminal narrowing. Moderate left neural foraminal narrowing. Right 
subarticular disc herniation is new with progression in right subarticular 
recess narrowing. 
-------------------------------------------------------------------------------- 
------
IMPRESSION: 1.  Acute to subacute compression fracture of L2 inferior endplate and L3 
superior endplate. 
2.  Discogenic/degenerative changes as above. 
3.  Progression of central canal and bilateral subarticular recess narrowing 
(mild) at L2-L3. 
4.  Progression of right subarticular recess narrowing at L5-S1 (severe), please 
correlate for right S1 radiculopathy.

## 2023-07-30 IMAGING — CT CT BRAIN WITHOUT CONTRAST
3 series · 15 of 47 positions shown, 18 images · non-contrast
Comparison: None.

________________________________________________________________________________________________ 
******** ADDENDUM #1 ********/n 
Comparison made to prior CT brain from May 07, 2023. 
The appearance of the brain is stable compared to the prior examination. Stable 
degree of white matter microangiopathic changes. 
CT BRAIN WITHOUT CONTRAST, 07/30/2023 [DATE]: 
CLINICAL INDICATION: Fall with history of intracranial hemorrhage. 
A search for DICOM formatted images was conducted for prior CT imaging studies 
completed at a non-affiliated media free facility.
TECHNIQUE: The head was scanned from vertex through skull base without contrast 
on a high resolution CT scanner using dose reduction techniques. Routine MPR 
reconstructions were performed.

[Series 2: head 3.0 j30s 1 · axial · 0.44mm/px · z∈[-158,-11]mm · 9 of 57 slices shown, 12 images]
[im 4/57  brain]
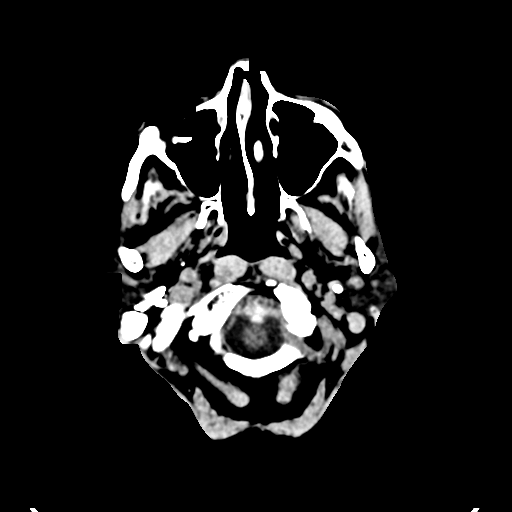
[im 4/57  bone]
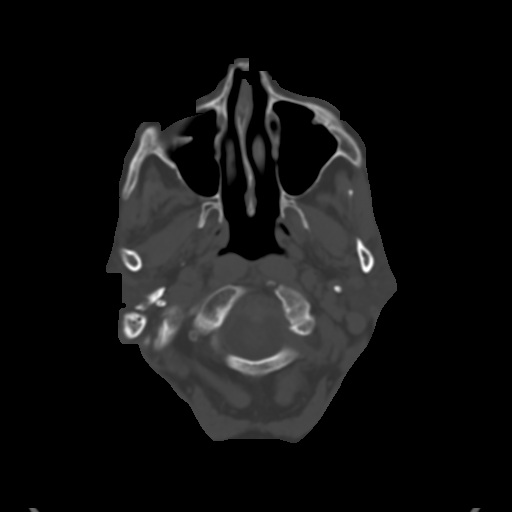
[im 10/57  brain]
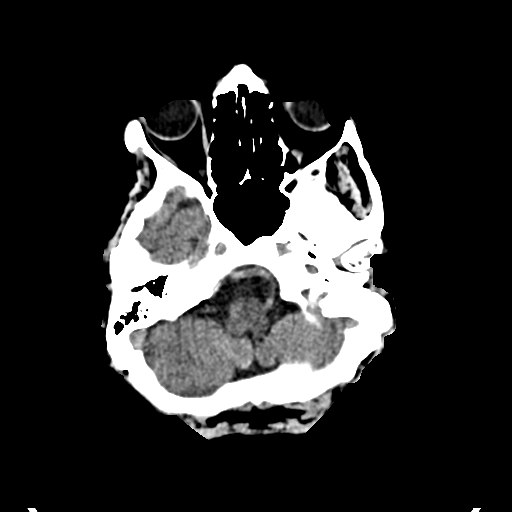
[im 16/57  brain]
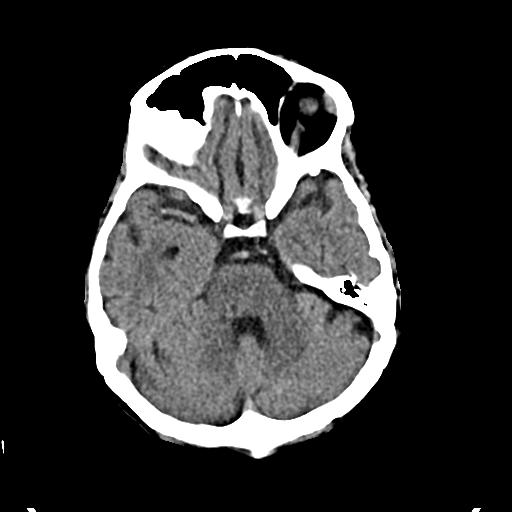
[im 22/57  brain]
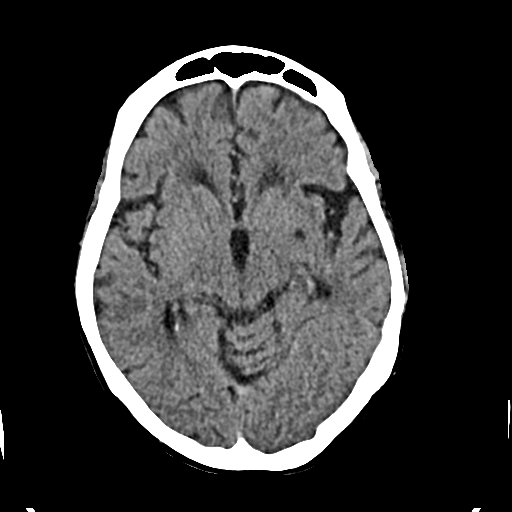
[im 29/57  brain]
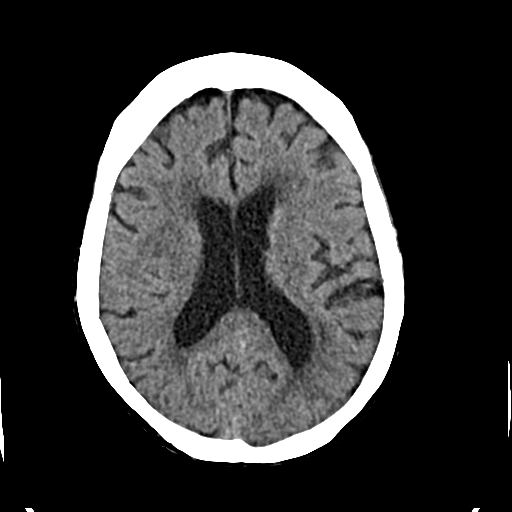
[im 29/57  bone]
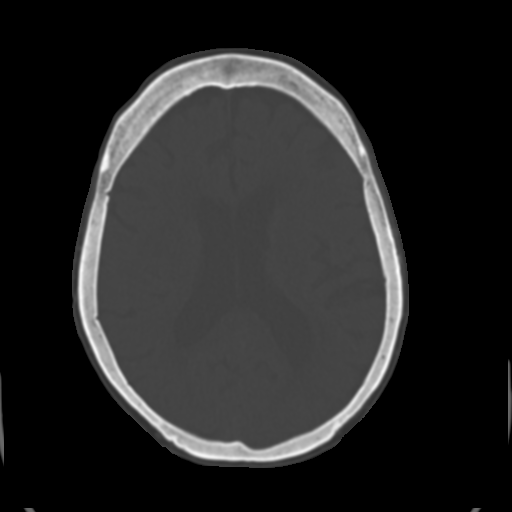
[im 35/57  brain]
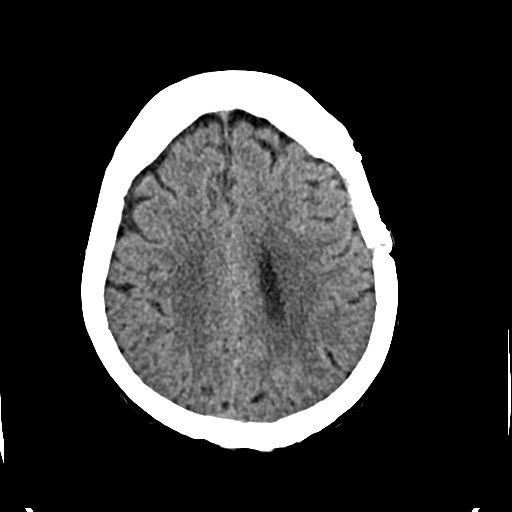
[im 41/57  brain]
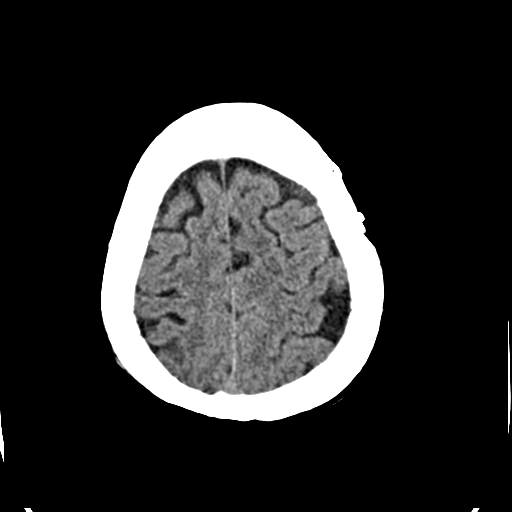
[im 47/57  brain]
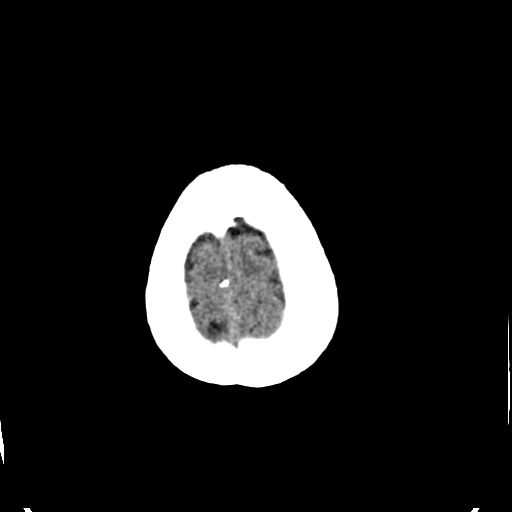
[im 53/57  brain]
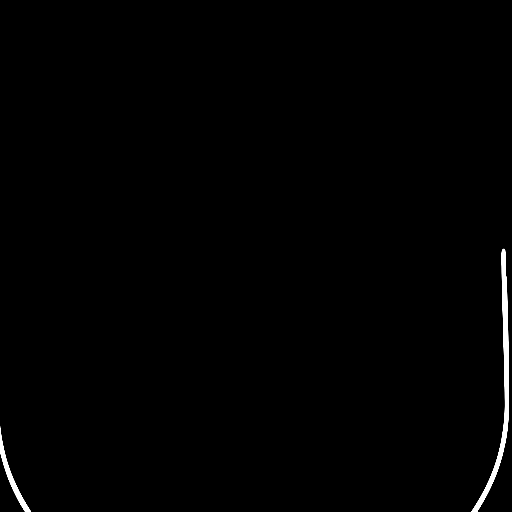
[im 53/57  bone]
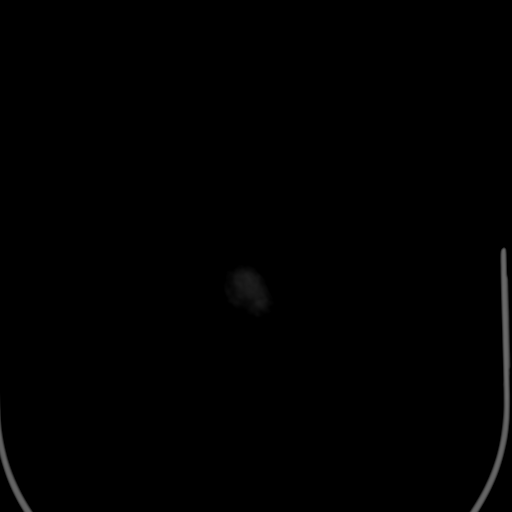

[Series 4: coronal · coronal · 0.36mm/px · 3 of 71 slices shown]
[im 24/71  brain]
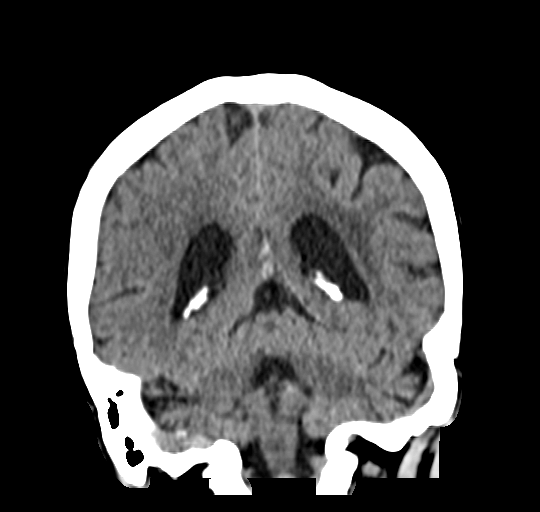
[im 32/71  brain]
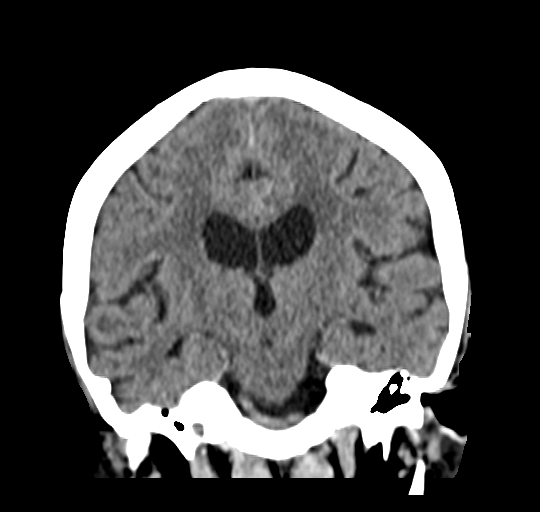
[im 39/71  brain]
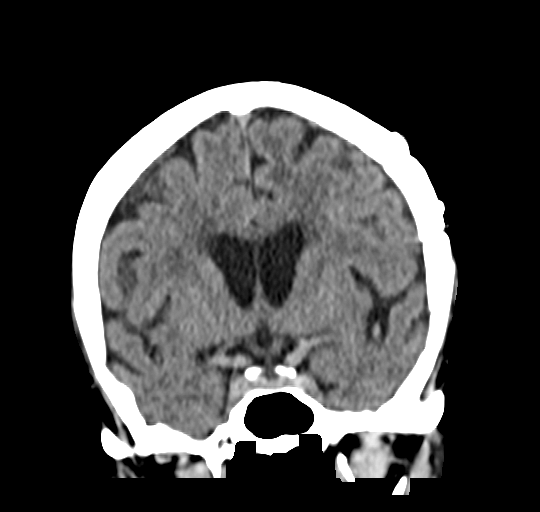

[Series 5: sagittal · sagittal · 0.36mm/px · 3 of 59 slices shown]
[im 20/59  brain]
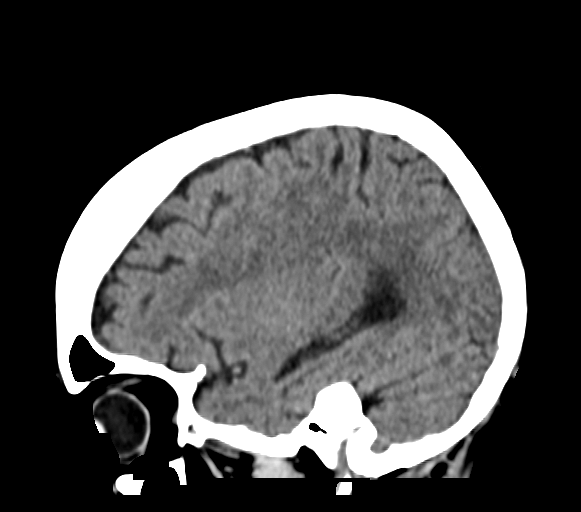
[im 30/59  brain]
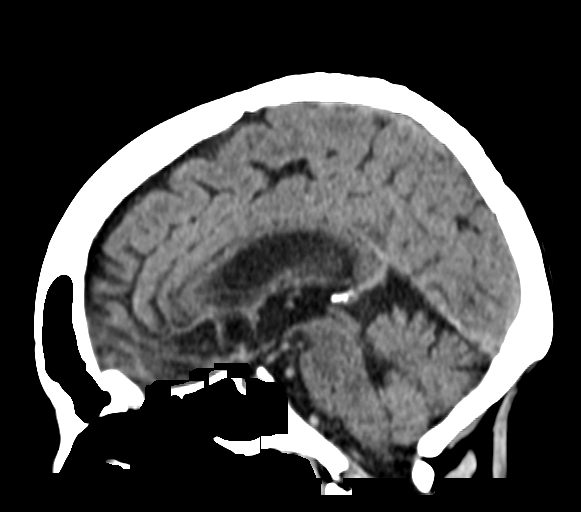
[im 39/59  brain]
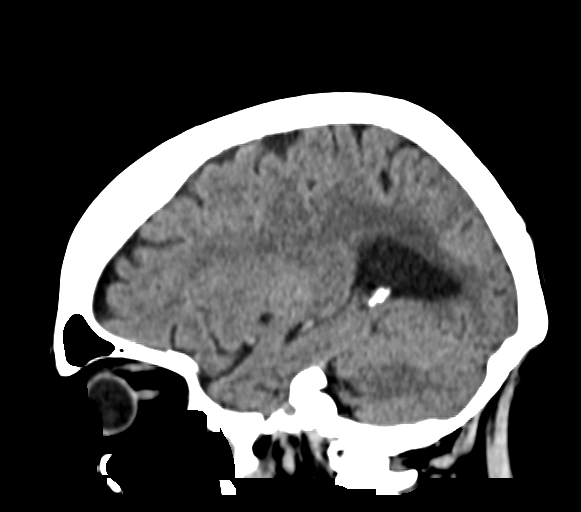

[15 of 47 positions shown; findings below may reference images not displayed]

Count of known CT and Cardiac Nuclear Medicine studies performed in the previous 
12 months = 0.
FINDINGS: -------------------------------------------------------------------------------- 
------------------------- 
INTRACRANIAL: 
Periventricular and deep white matter change, probably secondary to 
microangiopathy. Brainstem, cerebellum, deep gray nuclei show no acute 
abnormality. No acute ischemia, please note MRI is more sensitive for acute 
ischemia.  No acute intracranial hemorrhage, midline shift. 
-------------------------------------------------------------------------------- 
----------------------- 
OTHER: 
ORBITS/SINUSES/T-BONES:  Visualized orbits show no acute abnormality or mass.  
Mastoid air cells and middle ear cavities are clear.  Visualized paranasal 
sinuses are clear. 
BONES/SOFT TISSUES: Left craniotomy 
-------------------------------------------------------------------------------- 
-------------------
IMPRESSION: No acute intracranial abnormality.  White matter microangiopathic changes. 
RADIATION DOSE REDUCTION: All CT scans are performed using radiation dose 
reduction techniques, when applicable.  Technical factors are evaluated and 
adjusted to ensure appropriate moderation of exposure.  Automated dose 
management technology is applied to adjust the radiation doses to minimize 
exposure while achieving diagnostic quality images.
